# Patient Record
Sex: Male | Born: 1945 | Race: White | Hispanic: No | Smoking: Current every day smoker
Health system: Southern US, Community
[De-identification: ages and names within clinical notes are randomized; demographics above are authoritative.]

## PROBLEM LIST (undated history)

## (undated) DIAGNOSIS — F1010 Alcohol abuse, uncomplicated: Secondary | ICD-10-CM

## (undated) DIAGNOSIS — C61 Malignant neoplasm of prostate: Secondary | ICD-10-CM

## (undated) DIAGNOSIS — R000 Tachycardia, unspecified: Secondary | ICD-10-CM

## (undated) DIAGNOSIS — I10 Essential (primary) hypertension: Secondary | ICD-10-CM

## (undated) DIAGNOSIS — R7989 Other specified abnormal findings of blood chemistry: Secondary | ICD-10-CM

## (undated) DIAGNOSIS — K7682 Hepatic encephalopathy: Secondary | ICD-10-CM

## (undated) DIAGNOSIS — I639 Cerebral infarction, unspecified: Secondary | ICD-10-CM

## (undated) DIAGNOSIS — F32A Depression, unspecified: Secondary | ICD-10-CM

## (undated) DIAGNOSIS — F329 Major depressive disorder, single episode, unspecified: Secondary | ICD-10-CM

## (undated) DIAGNOSIS — G4700 Insomnia, unspecified: Secondary | ICD-10-CM

## (undated) DIAGNOSIS — I2510 Atherosclerotic heart disease of native coronary artery without angina pectoris: Secondary | ICD-10-CM

## (undated) DIAGNOSIS — I214 Non-ST elevation (NSTEMI) myocardial infarction: Secondary | ICD-10-CM

## (undated) DIAGNOSIS — F411 Generalized anxiety disorder: Secondary | ICD-10-CM

## (undated) DIAGNOSIS — K700 Alcoholic fatty liver: Secondary | ICD-10-CM

## (undated) DIAGNOSIS — K7030 Alcoholic cirrhosis of liver without ascites: Secondary | ICD-10-CM

## (undated) DIAGNOSIS — I5189 Other ill-defined heart diseases: Secondary | ICD-10-CM

## (undated) DIAGNOSIS — K7200 Acute and subacute hepatic failure without coma: Secondary | ICD-10-CM

## (undated) HISTORY — DX: Hepatic encephalopathy: K76.82

## (undated) HISTORY — DX: Malignant neoplasm of prostate: C61

## (undated) HISTORY — DX: Other specified abnormal findings of blood chemistry: R79.89

## (undated) HISTORY — DX: Generalized anxiety disorder: F41.1

## (undated) HISTORY — DX: Essential (primary) hypertension: I10

## (undated) HISTORY — DX: Alcoholic cirrhosis of liver without ascites: K70.30

## (undated) HISTORY — DX: Insomnia, unspecified: G47.00

## (undated) HISTORY — DX: Acute and subacute hepatic failure without coma: K72.00

---

## 1998-04-22 ENCOUNTER — Other Ambulatory Visit: Admission: RE | Admit: 1998-04-22 | Discharge: 1998-04-22 | Payer: Self-pay | Admitting: Family Medicine

## 2001-01-26 ENCOUNTER — Encounter: Admission: RE | Admit: 2001-01-26 | Discharge: 2001-01-26 | Payer: Self-pay | Admitting: Family Medicine

## 2001-01-26 ENCOUNTER — Encounter: Payer: Self-pay | Admitting: Family Medicine

## 2001-04-24 ENCOUNTER — Encounter: Admission: RE | Admit: 2001-04-24 | Discharge: 2001-04-24 | Payer: Self-pay | Admitting: Family Medicine

## 2001-04-24 ENCOUNTER — Encounter: Payer: Self-pay | Admitting: Family Medicine

## 2003-08-28 ENCOUNTER — Encounter: Admission: RE | Admit: 2003-08-28 | Discharge: 2003-08-28 | Payer: Self-pay | Admitting: Family Medicine

## 2003-08-28 ENCOUNTER — Encounter: Payer: Self-pay | Admitting: Family Medicine

## 2008-12-19 DIAGNOSIS — C61 Malignant neoplasm of prostate: Secondary | ICD-10-CM

## 2008-12-19 HISTORY — DX: Malignant neoplasm of prostate: C61

## 2009-04-28 ENCOUNTER — Ambulatory Visit: Admission: RE | Admit: 2009-04-28 | Discharge: 2009-07-27 | Payer: Self-pay | Admitting: Radiation Oncology

## 2009-07-28 ENCOUNTER — Ambulatory Visit: Admission: RE | Admit: 2009-07-28 | Discharge: 2009-09-25 | Payer: Self-pay | Admitting: Radiation Oncology

## 2011-11-15 ENCOUNTER — Other Ambulatory Visit: Payer: Self-pay | Admitting: Gastroenterology

## 2011-11-16 ENCOUNTER — Ambulatory Visit
Admission: RE | Admit: 2011-11-16 | Discharge: 2011-11-16 | Disposition: A | Discharge status: Home / Self Care | Payer: BC Managed Care – PPO | Source: Ambulatory Visit | Attending: Gastroenterology | Admitting: Gastroenterology

## 2011-11-16 MED ORDER — IOHEXOL 300 MG/ML  SOLN
125.0000 mL | Freq: Once | INTRAMUSCULAR | Status: AC | PRN
  Administered 2011-11-16: 125 mL via INTRAVENOUS

## 2012-11-19 DIAGNOSIS — R748 Abnormal levels of other serum enzymes: Secondary | ICD-10-CM | POA: Diagnosis not present

## 2013-01-25 DIAGNOSIS — N529 Male erectile dysfunction, unspecified: Secondary | ICD-10-CM | POA: Diagnosis not present

## 2013-01-25 DIAGNOSIS — C61 Malignant neoplasm of prostate: Secondary | ICD-10-CM | POA: Diagnosis not present

## 2013-04-08 DIAGNOSIS — E559 Vitamin D deficiency, unspecified: Secondary | ICD-10-CM | POA: Diagnosis not present

## 2013-05-16 DIAGNOSIS — E782 Mixed hyperlipidemia: Secondary | ICD-10-CM | POA: Diagnosis not present

## 2013-07-09 DIAGNOSIS — E559 Vitamin D deficiency, unspecified: Secondary | ICD-10-CM | POA: Diagnosis not present

## 2013-07-09 DIAGNOSIS — R7989 Other specified abnormal findings of blood chemistry: Secondary | ICD-10-CM | POA: Diagnosis not present

## 2013-09-05 DIAGNOSIS — C61 Malignant neoplasm of prostate: Secondary | ICD-10-CM | POA: Diagnosis not present

## 2013-09-19 DIAGNOSIS — C61 Malignant neoplasm of prostate: Secondary | ICD-10-CM | POA: Diagnosis not present

## 2013-09-19 DIAGNOSIS — N529 Male erectile dysfunction, unspecified: Secondary | ICD-10-CM | POA: Diagnosis not present

## 2013-11-05 ENCOUNTER — Ambulatory Visit (INDEPENDENT_AMBULATORY_CARE_PROVIDER_SITE_OTHER): Payer: Medicare Other | Admitting: Internal Medicine

## 2013-11-05 ENCOUNTER — Encounter: Payer: Self-pay | Admitting: Internal Medicine

## 2013-11-05 VITALS — BP 160/100 | HR 74 | Temp 97.3°F | Ht 70.0 in | Wt 217.0 lb

## 2013-11-05 DIAGNOSIS — H6980 Other specified disorders of Eustachian tube, unspecified ear: Secondary | ICD-10-CM

## 2013-11-05 DIAGNOSIS — H6983 Other specified disorders of Eustachian tube, bilateral: Secondary | ICD-10-CM

## 2013-11-05 DIAGNOSIS — H6990 Unspecified Eustachian tube disorder, unspecified ear: Secondary | ICD-10-CM

## 2013-11-05 MED ORDER — AMLODIPINE BESYLATE 10 MG PO TABS: 10.0000 mg | ORAL_TABLET | Freq: Every day | ORAL | Status: DC

## 2013-11-05 NOTE — Progress Notes (Signed)
  Subjective:    Patient ID: Joshua Henson, male    DOB: May 25, 1946, 67 y.o.   MRN: [id number]  HPI Presents acutely for being stopped up through the kind referral by Mr. Joshua Henson. He reports that he feels pressurized.,like with flying. In addition he has had some mild hearing loss - difficulty in crowds- which is chronic but exacerbated. He has mild rhinorrhea, h/o intermittent tinnitus that is now worse. No cough or post-nasal drainage. Tried sudafed 30 mg x 1 and it seemed to help.  Past Medical History  Diagnosis Date  . Prostate cancer 2010    External beam radiation (urol - [name], XRT [name])   Past Surgical History  Procedure Laterality Date  . None     Family History  Problem Relation Age of Onset  . Cancer Mother     esophageal  . Hypertension Mother   . Diabetes Mother   . Cancer Father     prostate  . Heart disease Maternal Grandfather     MI  . Cancer Paternal Grandfather     prostate   History   Social History  . Marital Status: Married    Spouse Name: N/A    Number of Children: 3  . Years of Education: 16   Occupational History  . lawyer    Social History Main Topics  . Smoking status: Current Every Day Smoker -- 1.00 packs/day for 30 years    Types: Cigarettes  . Smokeless tobacco: Never Used  . Alcohol Use: 3.5 oz/week    7 drink(s) per week     Comment: 14 oz per day  . Drug Use: No  . Sexual Activity: No   Other Topics Concern  . Not on file   Social History Narrative   HS[employer], [employer], [employer]. Married 22 yrs yrs - divorced. Twin boys - 33; 1 dtr - '94. Scientist, water quality.     No current outpatient prescriptions on file prior to visit.   No current facility-administered medications on file prior to visit.      Review of Systems System review is negative for any constitutional, cardiac, pulmonary, GI or neuro symptoms or complaints other than as described in the HPI.     Objective:   Physical Exam Filed Vitals:   11/05/13 1729  BP: 160/100  Pulse: 74  Temp: 97.3 F (36.3 C)   Gen'l;- Well nourished man in no acute distress HEENT- no tenderness of the facial sinuses to percussion. C&S clear. EACs clear. TM's pearly but with poor movement to insufflation. Nodes - negative Cor 2+ radial pulse, RRR Pulm - normal respirations Neuro - Alert and oriented, cognition is normal, normal gait and station.        Assessment & Plan:  1. Hearing loss - all evidence points to eustachian tube dysfunction. Plan  Sudafed (generic) 30 mg two or three times a day  2. Hearing loss - high frequency, speech range, loss. Best to get a precise assessment. Plan refer to Joshua Henson, AUD [phone], [address]   3. Blood pressure -  Watch it.  4. Smoking - recommend cessation.

## 2013-11-05 NOTE — Patient Instructions (Signed)
1. Hearing loss - all evidence points to eustachian tube dysfunction. Plan  Sudafed (generic) 30 mg two or three times a day  2. Hearing loss - high frequency, speech range, loss. Best to get a precise assessment. Plan refer to [name], AUD [phone], [address]   3. Blood pressure -  Watch it.  4. Smoking - recommend cessation.

## 2013-11-05 NOTE — Progress Notes (Signed)
Pre visit review using our clinic review tool, if applicable. No additional management support is needed unless otherwise documented below in the visit note. 

## 2013-11-25 ENCOUNTER — Other Ambulatory Visit: Payer: Self-pay | Admitting: Internal Medicine

## 2013-11-25 ENCOUNTER — Encounter: Payer: Self-pay | Admitting: Internal Medicine

## 2013-11-25 DIAGNOSIS — H9190 Unspecified hearing loss, unspecified ear: Secondary | ICD-10-CM | POA: Insufficient documentation

## 2013-11-25 DIAGNOSIS — H9313 Tinnitus, bilateral: Secondary | ICD-10-CM | POA: Insufficient documentation

## 2013-11-25 DIAGNOSIS — H9193 Unspecified hearing loss, bilateral: Secondary | ICD-10-CM

## 2013-11-28 DIAGNOSIS — H6980 Other specified disorders of Eustachian tube, unspecified ear: Secondary | ICD-10-CM | POA: Diagnosis not present

## 2013-11-28 DIAGNOSIS — J329 Chronic sinusitis, unspecified: Secondary | ICD-10-CM | POA: Diagnosis not present

## 2013-12-03 DIAGNOSIS — H9209 Otalgia, unspecified ear: Secondary | ICD-10-CM | POA: Diagnosis not present

## 2013-12-03 DIAGNOSIS — H8120 Vestibular neuronitis, unspecified ear: Secondary | ICD-10-CM | POA: Diagnosis not present

## 2013-12-03 DIAGNOSIS — H905 Unspecified sensorineural hearing loss: Secondary | ICD-10-CM | POA: Diagnosis not present

## 2013-12-03 DIAGNOSIS — H9319 Tinnitus, unspecified ear: Secondary | ICD-10-CM | POA: Diagnosis not present

## 2013-12-04 ENCOUNTER — Telehealth: Payer: Self-pay | Admitting: Internal Medicine

## 2013-12-04 NOTE — Telephone Encounter (Signed)
Recd records from [facility],Forwarding 4pgs to Dr.Norins

## 2013-12-30 ENCOUNTER — Other Ambulatory Visit: Payer: Self-pay | Admitting: Otolaryngology

## 2013-12-30 DIAGNOSIS — H9319 Tinnitus, unspecified ear: Secondary | ICD-10-CM | POA: Diagnosis not present

## 2013-12-30 DIAGNOSIS — H8120 Vestibular neuronitis, unspecified ear: Secondary | ICD-10-CM

## 2013-12-30 DIAGNOSIS — H905 Unspecified sensorineural hearing loss: Secondary | ICD-10-CM | POA: Diagnosis not present

## 2013-12-30 DIAGNOSIS — H903 Sensorineural hearing loss, bilateral: Secondary | ICD-10-CM

## 2013-12-30 DIAGNOSIS — H9209 Otalgia, unspecified ear: Secondary | ICD-10-CM | POA: Diagnosis not present

## 2013-12-31 ENCOUNTER — Telehealth: Payer: Self-pay | Admitting: Internal Medicine

## 2013-12-31 NOTE — Telephone Encounter (Signed)
Rec'd from [facility] forward 4 pages to Dr.Norins

## 2014-01-01 ENCOUNTER — Other Ambulatory Visit: Payer: Self-pay | Admitting: Otolaryngology

## 2014-01-01 DIAGNOSIS — H903 Sensorineural hearing loss, bilateral: Secondary | ICD-10-CM

## 2014-01-01 DIAGNOSIS — H905 Unspecified sensorineural hearing loss: Secondary | ICD-10-CM

## 2014-01-01 DIAGNOSIS — H9319 Tinnitus, unspecified ear: Secondary | ICD-10-CM

## 2014-01-02 ENCOUNTER — Inpatient Hospital Stay: Admission: RE | Admit: 2014-01-02 | Payer: Medicare Other | Source: Ambulatory Visit

## 2014-01-03 ENCOUNTER — Other Ambulatory Visit: Payer: Self-pay | Admitting: Otolaryngology

## 2014-01-03 DIAGNOSIS — H903 Sensorineural hearing loss, bilateral: Secondary | ICD-10-CM | POA: Diagnosis not present

## 2014-01-03 DIAGNOSIS — H9319 Tinnitus, unspecified ear: Secondary | ICD-10-CM | POA: Diagnosis not present

## 2014-01-03 DIAGNOSIS — H905 Unspecified sensorineural hearing loss: Secondary | ICD-10-CM | POA: Diagnosis not present

## 2014-01-03 LAB — CREATININE, SERUM: CREATININE: 0.84 mg/dL (ref 0.50–1.35)

## 2014-01-03 LAB — BUN: BUN: 14 mg/dL (ref 6–23)

## 2014-01-07 ENCOUNTER — Inpatient Hospital Stay: Admission: RE | Admit: 2014-01-07 | Payer: Medicare Other | Source: Ambulatory Visit

## 2014-01-07 ENCOUNTER — Other Ambulatory Visit: Payer: Medicare Other

## 2014-01-07 ENCOUNTER — Ambulatory Visit
Admission: RE | Admit: 2014-01-07 | Discharge: 2014-01-07 | Disposition: A | Discharge status: Home / Self Care | Payer: Medicare Other | Source: Ambulatory Visit | Attending: Otolaryngology | Admitting: Otolaryngology

## 2014-01-07 DIAGNOSIS — H9319 Tinnitus, unspecified ear: Secondary | ICD-10-CM | POA: Diagnosis not present

## 2014-01-07 DIAGNOSIS — H903 Sensorineural hearing loss, bilateral: Secondary | ICD-10-CM

## 2014-01-07 DIAGNOSIS — H905 Unspecified sensorineural hearing loss: Secondary | ICD-10-CM

## 2014-01-07 DIAGNOSIS — H9190 Unspecified hearing loss, unspecified ear: Secondary | ICD-10-CM | POA: Diagnosis not present

## 2014-01-07 MED ORDER — IOHEXOL 300 MG/ML  SOLN
100.0000 mL | Freq: Once | INTRAMUSCULAR | Status: AC | PRN
  Administered 2014-01-07: 75 mL via INTRAVENOUS

## 2014-01-10 ENCOUNTER — Telehealth: Payer: Self-pay | Admitting: Internal Medicine

## 2014-01-10 NOTE — Telephone Encounter (Signed)
Rec'd from [facility] forward 4 pages to [place]

## 2014-01-17 DIAGNOSIS — Z720 Tobacco use: Secondary | ICD-10-CM | POA: Insufficient documentation

## 2014-02-07 DIAGNOSIS — H9319 Tinnitus, unspecified ear: Secondary | ICD-10-CM | POA: Diagnosis not present

## 2014-02-07 DIAGNOSIS — H918X9 Other specified hearing loss, unspecified ear: Secondary | ICD-10-CM | POA: Diagnosis not present

## 2014-02-07 DIAGNOSIS — H903 Sensorineural hearing loss, bilateral: Secondary | ICD-10-CM | POA: Diagnosis not present

## 2014-02-07 DIAGNOSIS — H9190 Unspecified hearing loss, unspecified ear: Secondary | ICD-10-CM | POA: Diagnosis not present

## 2014-07-01 ENCOUNTER — Other Ambulatory Visit: Payer: Self-pay | Admitting: Physician Assistant

## 2014-07-01 ENCOUNTER — Inpatient Hospital Stay (HOSPITAL_COMMUNITY)
Admission: EM | Admit: 2014-07-01 | Discharge: 2014-07-07 | DRG: 640 | Disposition: A | Discharge status: Rehabilitation Facility | Payer: Medicare Other | Attending: Internal Medicine | Admitting: Internal Medicine

## 2014-07-01 ENCOUNTER — Encounter (HOSPITAL_COMMUNITY): Payer: Self-pay | Admitting: Emergency Medicine

## 2014-07-01 ENCOUNTER — Ambulatory Visit
Admission: RE | Admit: 2014-07-01 | Discharge: 2014-07-01 | Disposition: A | Discharge status: Home / Self Care | Payer: Medicare Other | Source: Ambulatory Visit | Attending: Physician Assistant | Admitting: Physician Assistant

## 2014-07-01 DIAGNOSIS — W19XXXS Unspecified fall, sequela: Secondary | ICD-10-CM

## 2014-07-01 DIAGNOSIS — J320 Chronic maxillary sinusitis: Secondary | ICD-10-CM | POA: Diagnosis present

## 2014-07-01 DIAGNOSIS — R55 Syncope and collapse: Secondary | ICD-10-CM | POA: Diagnosis not present

## 2014-07-01 DIAGNOSIS — R5381 Other malaise: Secondary | ICD-10-CM | POA: Diagnosis not present

## 2014-07-01 DIAGNOSIS — F411 Generalized anxiety disorder: Secondary | ICD-10-CM | POA: Diagnosis present

## 2014-07-01 DIAGNOSIS — H8110 Benign paroxysmal vertigo, unspecified ear: Secondary | ICD-10-CM | POA: Diagnosis present

## 2014-07-01 DIAGNOSIS — R404 Transient alteration of awareness: Secondary | ICD-10-CM | POA: Diagnosis not present

## 2014-07-01 DIAGNOSIS — S0101XD Laceration without foreign body of scalp, subsequent encounter: Secondary | ICD-10-CM

## 2014-07-01 DIAGNOSIS — S0100XA Unspecified open wound of scalp, initial encounter: Secondary | ICD-10-CM | POA: Diagnosis present

## 2014-07-01 DIAGNOSIS — E785 Hyperlipidemia, unspecified: Secondary | ICD-10-CM | POA: Diagnosis present

## 2014-07-01 DIAGNOSIS — Z923 Personal history of irradiation: Secondary | ICD-10-CM

## 2014-07-01 DIAGNOSIS — E860 Dehydration: Secondary | ICD-10-CM | POA: Diagnosis not present

## 2014-07-01 DIAGNOSIS — S0180XA Unspecified open wound of other part of head, initial encounter: Secondary | ICD-10-CM | POA: Diagnosis not present

## 2014-07-01 DIAGNOSIS — F1010 Alcohol abuse, uncomplicated: Secondary | ICD-10-CM | POA: Diagnosis present

## 2014-07-01 DIAGNOSIS — F17200 Nicotine dependence, unspecified, uncomplicated: Secondary | ICD-10-CM | POA: Diagnosis present

## 2014-07-01 DIAGNOSIS — I951 Orthostatic hypotension: Secondary | ICD-10-CM | POA: Diagnosis present

## 2014-07-01 DIAGNOSIS — Y92009 Unspecified place in unspecified non-institutional (private) residence as the place of occurrence of the external cause: Secondary | ICD-10-CM

## 2014-07-01 DIAGNOSIS — I6350 Cerebral infarction due to unspecified occlusion or stenosis of unspecified cerebral artery: Secondary | ICD-10-CM | POA: Diagnosis present

## 2014-07-01 DIAGNOSIS — F3289 Other specified depressive episodes: Secondary | ICD-10-CM | POA: Diagnosis present

## 2014-07-01 DIAGNOSIS — S2020XA Contusion of thorax, unspecified, initial encounter: Secondary | ICD-10-CM | POA: Diagnosis not present

## 2014-07-01 DIAGNOSIS — H9193 Unspecified hearing loss, bilateral: Secondary | ICD-10-CM

## 2014-07-01 DIAGNOSIS — S0181XA Laceration without foreign body of other part of head, initial encounter: Secondary | ICD-10-CM

## 2014-07-01 DIAGNOSIS — W1809XA Striking against other object with subsequent fall, initial encounter: Secondary | ICD-10-CM | POA: Diagnosis present

## 2014-07-01 DIAGNOSIS — R748 Abnormal levels of other serum enzymes: Secondary | ICD-10-CM | POA: Diagnosis not present

## 2014-07-01 DIAGNOSIS — Z8546 Personal history of malignant neoplasm of prostate: Secondary | ICD-10-CM

## 2014-07-01 DIAGNOSIS — Z79899 Other long term (current) drug therapy: Secondary | ICD-10-CM

## 2014-07-01 DIAGNOSIS — J9819 Other pulmonary collapse: Secondary | ICD-10-CM | POA: Diagnosis not present

## 2014-07-01 DIAGNOSIS — S0101XA Laceration without foreign body of scalp, initial encounter: Secondary | ICD-10-CM

## 2014-07-01 DIAGNOSIS — F329 Major depressive disorder, single episode, unspecified: Secondary | ICD-10-CM | POA: Diagnosis present

## 2014-07-01 DIAGNOSIS — E876 Hypokalemia: Secondary | ICD-10-CM | POA: Diagnosis present

## 2014-07-01 LAB — CBC
HCT: 36.3 % — ABNORMAL LOW (ref 39.0–52.0)
Hemoglobin: 13.4 g/dL (ref 13.0–17.0)
MCH: 37.9 pg — ABNORMAL HIGH (ref 26.0–34.0)
MCHC: 36.9 g/dL — ABNORMAL HIGH (ref 30.0–36.0)
MCV: 102.5 fL — AB (ref 78.0–100.0)
PLATELETS: 164 10*3/uL (ref 150–400)
RBC: 3.54 MIL/uL — AB (ref 4.22–5.81)
RDW: 14 % (ref 11.5–15.5)
WBC: 4.3 10*3/uL (ref 4.0–10.5)

## 2014-07-01 LAB — I-STAT CHEM 8, ED
BUN: 10 mg/dL (ref 6–23)
Calcium, Ion: 1.06 mmol/L — ABNORMAL LOW (ref 1.13–1.30)
Chloride: 98 mEq/L (ref 96–112)
Creatinine, Ser: 1.1 mg/dL (ref 0.50–1.35)
Glucose, Bld: 125 mg/dL — ABNORMAL HIGH (ref 70–99)
HCT: 41 % (ref 39.0–52.0)
Hemoglobin: 13.9 g/dL (ref 13.0–17.0)
Potassium: 3.4 mEq/L — ABNORMAL LOW (ref 3.7–5.3)
SODIUM: 139 meq/L (ref 137–147)
TCO2: 28 mmol/L (ref 0–100)

## 2014-07-01 LAB — I-STAT TROPONIN, ED: Troponin i, poc: 0.01 ng/mL (ref 0.00–0.08)

## 2014-07-01 NOTE — ED Provider Notes (Signed)
CSN: [id number]     Arrival date & time 07/01/14  2259 History   First MD Initiated Contact with Patient 07/01/14 2304     Chief Complaint  Patient presents with  . Loss of Consciousness     (Consider location/radiation/quality/duration/timing/severity/associated sxs/prior Treatment) HPI Comments: The patient is a 68 year old male with a history of hypertension and hypercholesterolemia. He has recently been diagnosed with some type of injury or disorder that causes him to feel off balance occasionally. He states that on Friday evening, or 4 days ago he had a syncopal event after feeling a heaviness in his head, fell to the ground striking his chest on a chair and then felt completely back to normal after 90 seconds. There was no reported seizure activity, he did not seek emergency evaluation at that time. He was seen at his family doctor's office today, lab work was done and an x-ray of his chest, he is unaware of the results. He states that this evening he had another syncopal episode. He does not remember any details of this event. His family member who accompanies him to the hospital states that she heard a large thud coming from the kitchen, found him on the ground with a laceration to above the left eyebrow he which was bleeding., There was no seizure activity, no posturing, no vomiting. When paramedics arrived they dressed the wound with a pressure dressing, found him to be hypotensive upon standing but normotensive when lying down. The patient denies any prodromal symptoms, he has no memory of the event. He does endorse being a heavy daily drinker and states he had 3 drinks tonight but he states this is normal for him. He has not had any exertional symptoms, denies swelling, denies diarrhea but has not had an appetite, has had a mild weight loss over the last 3 months  Patient is a 68 y.o. male presenting with syncope. The history is provided by the patient, a relative and the EMS personnel.   Loss of Consciousness   Past Medical History  Diagnosis Date  . Prostate cancer 2010    External beam radiation (urol - Risa Grill, XRT Valere Dross)   Past Surgical History  Procedure Laterality Date  . None     Family History  Problem Relation Age of Onset  . Cancer Mother     esophageal  . Hypertension Mother   . Diabetes Mother   . Cancer Father     prostate  . Heart disease Maternal Grandfather     MI  . Cancer Paternal Grandfather     prostate   History  Substance Use Topics  . Smoking status: Current Every Day Smoker -- 1.00 packs/day for 30 years    Types: Cigarettes  . Smokeless tobacco: Never Used  . Alcohol Use: 3.5 oz/week    7 drink(s) per week     Comment: 14 oz per day    Review of Systems  Cardiovascular: Positive for syncope.  All other systems reviewed and are negative.     Allergies  Celebrex  Home Medications   Prior to Admission medications   Medication Sig Start Date End Date Taking? Authorizing Provider  amLODipine (NORVASC) 10 MG tablet Take 1 tablet (10 mg total) by mouth daily. 11/05/13  Yes Neena Rhymes, MD  Atorvastatin Calcium (LIPITOR PO) Take by mouth daily.   Yes Historical Provider, MD   BP 137/87  Pulse 72  Temp(Src) 98.9 F (37.2 C) (Oral)  Resp 18  Ht 5' 10.5" (  1.791 m)  Wt 205 lb 1.6 oz (93.033 kg)  BMI 29.00 kg/m2  SpO2 95% Physical Exam  Nursing note and vitals reviewed. Constitutional: He appears well-developed and well-nourished. No distress.  HENT:  Head: Normocephalic.  Mouth/Throat: Oropharynx is clear and moist. No oropharyngeal exudate.  Linear laceration to the left forehead above the eyebrow with associated hematoma  Eyes: Conjunctivae and EOM are normal. Pupils are equal, round, and reactive to light. Right eye exhibits no discharge. Left eye exhibits no discharge. No scleral icterus.  Neck: Normal range of motion. Neck supple. No JVD present. No thyromegaly present.  Cardiovascular: Normal rate,  regular rhythm, normal heart sounds and intact distal pulses.  Exam reveals no gallop and no friction rub.   No murmur heard. No murmur, no JVD, no carotid bruit  Pulmonary/Chest: Effort normal and breath sounds normal. No respiratory distress. He has no wheezes. He has no rales.  Abdominal: Soft. Bowel sounds are normal. He exhibits no distension and no mass. There is no tenderness.  Musculoskeletal: Normal range of motion. He exhibits no edema and no tenderness.  Lymphadenopathy:    He has no cervical adenopathy.  Neurological: He is alert. Coordination normal.  Speech is clear, cranial nerves III through XII are intact, memory is intact, strength is normal in all 4 extremities including grips, sensation is intact to light touch and pinprick in all 4 extremities.  No limb ataxia, no truncal ataxia  Skin: Skin is warm and dry. No rash noted. No erythema.  Psychiatric: He has a normal mood and affect. His behavior is normal.    ED Course  Procedures (including critical care time) Labs Review Labs Reviewed  CBC - Abnormal; Notable for the following:    RBC 3.54 (*)    HCT 36.3 (*)    MCV 102.5 (*)    MCH 37.9 (*)    MCHC 36.9 (*)    All other components within normal limits  URINALYSIS, ROUTINE W REFLEX MICROSCOPIC - Abnormal; Notable for the following:    Hgb urine dipstick TRACE (*)    All other components within normal limits  COMPREHENSIVE METABOLIC PANEL - Abnormal; Notable for the following:    Potassium 3.6 (*)    Glucose, Bld 113 (*)    Albumin 3.3 (*)    AST 101 (*)    ALT 55 (*)    Anion gap 21 (*)    All other components within normal limits  ETHANOL - Abnormal; Notable for the following:    Alcohol, Ethyl (B) 155 (*)    All other components within normal limits  CBG MONITORING, ED - Abnormal; Notable for the following:    Glucose-Capillary 119 (*)    All other components within normal limits  I-STAT CHEM 8, ED - Abnormal; Notable for the following:    Potassium  3.4 (*)    Glucose, Bld 125 (*)    Calcium, Ion 1.06 (*)    All other components within normal limits  URINE MICROSCOPIC-ADD ON  TROPONIN I  TROPONIN I  TROPONIN I  BASIC METABOLIC PANEL  CBC  I-STAT TROPOININ, ED    Imaging Review Dg Chest 2 View  07/01/2014   CLINICAL DATA:  Syncopal F cell falling for do not image chest  EXAM: CHEST  2 VIEW  COMPARISON:  None.  FINDINGS: The right hemidiaphragm is higher than the left. There is atelectasis anteriorly and posteriorly at the left lung base. There is no pneumothorax or pleural effusion. The right lung  is clear. The cardiac silhouette is top-normal in size. The pulmonary vascularity is normal. There is mild tortuosity of the descending thoracic aorta. The observed portions of the ribs exhibit no acute abnormalities. The thoracic vertebral bodies are preserved in height.  IMPRESSION: There is atelectasis at the left lung base. There is no pneumothorax nor acute rib fracture demonstrated.   Electronically Signed   By: David  Martinique   On: 07/01/2014 15:21   Ct Head Wo Contrast  07/02/2014   CLINICAL DATA:  Syncope and head injury  EXAM: CT HEAD WITHOUT CONTRAST  TECHNIQUE: Contiguous axial images were obtained from the base of the skull through the vertex without intravenous contrast.  COMPARISON:  01/07/2014 temporal bone CT  FINDINGS: Skull and Sinuses:There is a large left frontotemporal scalp laceration and contusion. No underlying fracture.  Probable mucous retention cyst in the posterior left maxillary sinus. An infraorbital air cell on the left may limit left maxillary sinus outflow. Chronic mucosal thickening and neoosteogenesis of the right maxillary sinus. These changes of chronic sinusitis are stable from previous.  Orbits: No acute abnormality.  Brain: No evidence of acute abnormality, such as acute infarction, hemorrhage, hydrocephalus, or mass lesion/mass effect. Generalized cerebral and cerebellar volume loss which is borderline age  advanced. There is mild chronic small vessel disease with patchy deep and periventricular cerebral white matter low density.  IMPRESSION: 1. No acute intracranial injury. 2. Left frontotemporal scalp contusion.  No calvarial fracture. 3. Chronic findings are noted above.   Electronically Signed   By: Jorje Guild M.D.   On: 07/02/2014 01:39     EKG Interpretation   Date/Time:  Tuesday July 01 2014 23:10:21 EDT Ventricular Rate:  82 PR Interval:  148 QRS Duration: 90 QT Interval:  380 QTC Calculation: 444 R Axis:   86 Text Interpretation:  Sinus rhythm Borderline right axis deviation  Borderline T abnormalities, diffuse leads Abnormal ekg No old tracing to  compare Confirmed by Calia Napp  MD, Raheim Beutler ([id number]) on 07/01/2014 11:22:02 PM      MDM   Final diagnoses:  Syncope, unspecified syncope type  Laceration of face, initial encounter    The patient has suffered a head injury, he has a laceration that will need repair, he needs a workup for syncope as he has had 2 episodes of unexplained syncope within 4 days. EKG unremarkable except for nonspecific T-wave flattening. He will be placed on a cardiac monitor, CT scan of the head, labs, and cardiac monitoring.  Cardiac monitoring without any arrhythmias in the emergency department, labs overall unremarkable and CT scan of the head without intracranial injury. The patient would benefit from overnight admission to the hospital for evaluation on a cardiac monitor. We'll discuss with the hospitalist.  LACERATION REPAIR Performed by: Johnna Acosta Authorized by: Johnna Acosta Consent: Verbal consent obtained. Risks and benefits: risks, benefits and alternatives were discussed Consent given by: patient Patient identity confirmed: provided demographic data Prepped and Draped in normal sterile fashion Wound explored  Laceration Location: Face  Laceration Length: 5 cm  No Foreign Bodies seen or palpated  Anesthesia: local  infiltration  Local anesthetic: lidocaine 1 % with epinephrine  Anesthetic total: 4 ml  Irrigation method: syringe Amount of cleaning: standard  Skin closure: 5-0 Prolene   Number of sutures: 7   Technique: Simple interrupted   Patient tolerance: Patient tolerated the procedure well with no immediate complications.   Johnna Acosta, MD 07/02/14 [phone]

## 2014-07-01 NOTE — ED Notes (Signed)
Dr Miller at bedside. 

## 2014-07-01 NOTE — ED Notes (Signed)
Per EMS: Pt from home when he had a syncopal episode, hitting head on countertop. On EMS arrival, pt AO x4. Bleeding controlled to head with pressure bandage. Pt noted to have BP 140/60 laying, but dropped to 60/40 while standing. Pt reports 3 drinks this evening. Pt report similar episode on Friday, where he hit the left side of chest. Pt saw PCP for that fall where he had chest xray.  Pt reports being dx with vertigo.

## 2014-07-02 ENCOUNTER — Emergency Department (HOSPITAL_COMMUNITY): Payer: Medicare Other

## 2014-07-02 DIAGNOSIS — I951 Orthostatic hypotension: Secondary | ICD-10-CM | POA: Diagnosis present

## 2014-07-02 DIAGNOSIS — R55 Syncope and collapse: Secondary | ICD-10-CM

## 2014-07-02 DIAGNOSIS — S0100XA Unspecified open wound of scalp, initial encounter: Secondary | ICD-10-CM | POA: Diagnosis not present

## 2014-07-02 DIAGNOSIS — F1010 Alcohol abuse, uncomplicated: Secondary | ICD-10-CM | POA: Diagnosis present

## 2014-07-02 DIAGNOSIS — S0101XA Laceration without foreign body of scalp, initial encounter: Secondary | ICD-10-CM

## 2014-07-02 LAB — CBC
HCT: 35.9 % — ABNORMAL LOW (ref 39.0–52.0)
HCT: 36.3 % — ABNORMAL LOW (ref 39.0–52.0)
Hemoglobin: 13 g/dL (ref 13.0–17.0)
Hemoglobin: 12.7 g/dL — ABNORMAL LOW (ref 13.0–17.0)
MCH: 35.7 pg — AB (ref 26.0–34.0)
MCH: 35.4 pg — ABNORMAL HIGH (ref 26.0–34.0)
MCHC: 36.2 g/dL — AB (ref 30.0–36.0)
MCHC: 35 g/dL (ref 30.0–36.0)
MCV: 98.6 fL (ref 78.0–100.0)
MCV: 101.1 fL — ABNORMAL HIGH (ref 78.0–100.0)
PLATELETS: 163 10*3/uL (ref 150–400)
PLATELETS: 161 10*3/uL (ref 150–400)
RBC: 3.64 MIL/uL — ABNORMAL LOW (ref 4.22–5.81)
RBC: 3.59 MIL/uL — AB (ref 4.22–5.81)
RDW: 14.1 % (ref 11.5–15.5)
RDW: 14.2 % (ref 11.5–15.5)
WBC: 5.8 10*3/uL (ref 4.0–10.5)
WBC: 5.6 10*3/uL (ref 4.0–10.5)

## 2014-07-02 LAB — URINALYSIS, ROUTINE W REFLEX MICROSCOPIC
BILIRUBIN URINE: NEGATIVE
Glucose, UA: NEGATIVE mg/dL
Ketones, ur: NEGATIVE mg/dL
LEUKOCYTES UA: NEGATIVE
NITRITE: NEGATIVE
PH: 7 (ref 5.0–8.0)
Protein, ur: NEGATIVE mg/dL
Specific Gravity, Urine: 1.016 (ref 1.005–1.030)
Urobilinogen, UA: 1 mg/dL (ref 0.0–1.0)

## 2014-07-02 LAB — BASIC METABOLIC PANEL
ANION GAP: 16 — AB (ref 5–15)
BUN: 9 mg/dL (ref 6–23)
CALCIUM: 8.9 mg/dL (ref 8.4–10.5)
CO2: 25 mEq/L (ref 19–32)
CREATININE: 0.57 mg/dL (ref 0.50–1.35)
Chloride: 103 mEq/L (ref 96–112)
Glucose, Bld: 111 mg/dL — ABNORMAL HIGH (ref 70–99)
Potassium: 3.3 mEq/L — ABNORMAL LOW (ref 3.7–5.3)
Sodium: 144 mEq/L (ref 137–147)

## 2014-07-02 LAB — COMPREHENSIVE METABOLIC PANEL
ALT: 55 U/L — AB (ref 0–53)
AST: 101 U/L — ABNORMAL HIGH (ref 0–37)
Albumin: 3.3 g/dL — ABNORMAL LOW (ref 3.5–5.2)
Alkaline Phosphatase: 61 U/L (ref 39–117)
Anion gap: 21 — ABNORMAL HIGH (ref 5–15)
BUN: 10 mg/dL (ref 6–23)
CALCIUM: 8.8 mg/dL (ref 8.4–10.5)
CO2: 21 mEq/L (ref 19–32)
Chloride: 98 mEq/L (ref 96–112)
Creatinine, Ser: 0.58 mg/dL (ref 0.50–1.35)
GFR calc Af Amer: >90 mL/min (ref >90)
GLUCOSE: 113 mg/dL — AB (ref 70–99)
POTASSIUM: 3.6 meq/L — AB (ref 3.7–5.3)
SODIUM: 140 meq/L (ref 137–147)
Total Bilirubin: 0.6 mg/dL (ref 0.3–1.2)
Total Protein: 6.4 g/dL (ref 6.0–8.3)

## 2014-07-02 LAB — ETHANOL: Alcohol, Ethyl (B): 155 mg/dL — ABNORMAL HIGH (ref 0–11)

## 2014-07-02 LAB — TROPONIN I
Troponin I: <0.3 ng/mL (ref <0.30)
Troponin I: <0.3 ng/mL (ref <0.30)
Troponin I: <0.3 ng/mL (ref <0.30)

## 2014-07-02 LAB — URINE MICROSCOPIC-ADD ON

## 2014-07-02 LAB — CBG MONITORING, ED: GLUCOSE-CAPILLARY: 119 mg/dL — AB (ref 70–99)

## 2014-07-02 MED ORDER — SODIUM CHLORIDE 0.9 % IV SOLN
INTRAVENOUS | Status: DC
  Administered 2014-07-02: 100 mL/h via INTRAVENOUS
  Administered 2014-07-03 – 2014-07-04 (×3): via INTRAVENOUS
  Administered 2014-07-05 (×2): 1000 mL via INTRAVENOUS

## 2014-07-02 MED ORDER — ONDANSETRON HCL 4 MG/2ML IJ SOLN: 4.0000 mg | Freq: Four times a day (QID) | INTRAMUSCULAR | Status: DC | PRN

## 2014-07-02 MED ORDER — FOLIC ACID 1 MG PO TABS
1.0000 mg | ORAL_TABLET | Freq: Every day | ORAL | Status: DC
  Administered 2014-07-02 – 2014-07-07 (×6): 1 mg via ORAL
  Filled 2014-07-02 (×6): qty 1

## 2014-07-02 MED ORDER — ONDANSETRON HCL 4 MG PO TABS: 4.0000 mg | ORAL_TABLET | Freq: Four times a day (QID) | ORAL | Status: DC | PRN

## 2014-07-02 MED ORDER — THIAMINE HCL 100 MG/ML IJ SOLN
100.0000 mg | Freq: Every day | INTRAMUSCULAR | Status: DC
  Filled 2014-07-02 (×2): qty 1

## 2014-07-02 MED ORDER — LORAZEPAM 2 MG/ML IJ SOLN
0.0000 mg | Freq: Four times a day (QID) | INTRAMUSCULAR | Status: AC
  Administered 2014-07-02: 1 mg via INTRAVENOUS
  Administered 2014-07-02 – 2014-07-04 (×3): 2 mg via INTRAVENOUS
  Filled 2014-07-02: qty 2
  Filled 2014-07-02 (×4): qty 1

## 2014-07-02 MED ORDER — HYDROMORPHONE HCL PF 1 MG/ML IJ SOLN: 0.5000 mg | INTRAMUSCULAR | Status: DC | PRN

## 2014-07-02 MED ORDER — SODIUM CHLORIDE 0.9 % IV BOLUS (SEPSIS)
1000.0000 mL | Freq: Once | INTRAVENOUS | Status: AC
  Administered 2014-07-02: 1000 mL via INTRAVENOUS

## 2014-07-02 MED ORDER — VITAMIN B-1 100 MG PO TABS
100.0000 mg | ORAL_TABLET | Freq: Every day | ORAL | Status: DC
  Administered 2014-07-02 – 2014-07-07 (×6): 100 mg via ORAL
  Filled 2014-07-02 (×6): qty 1

## 2014-07-02 MED ORDER — ACETAMINOPHEN 650 MG RE SUPP: 650.0000 mg | Freq: Four times a day (QID) | RECTAL | Status: DC | PRN

## 2014-07-02 MED ORDER — ADULT MULTIVITAMIN W/MINERALS CH
1.0000 | ORAL_TABLET | Freq: Every day | ORAL | Status: DC
  Administered 2014-07-02 – 2014-07-07 (×6): 1 via ORAL
  Filled 2014-07-02 (×6): qty 1

## 2014-07-02 MED ORDER — LORAZEPAM 1 MG PO TABS
1.0000 mg | ORAL_TABLET | Freq: Four times a day (QID) | ORAL | Status: AC | PRN
  Administered 2014-07-03 – 2014-07-04 (×3): 1 mg via ORAL
  Filled 2014-07-02 (×4): qty 1

## 2014-07-02 MED ORDER — LORAZEPAM 2 MG/ML IJ SOLN
0.0000 mg | Freq: Two times a day (BID) | INTRAMUSCULAR | Status: AC
  Administered 2014-07-03: 4 mg via INTRAVENOUS
  Administered 2014-07-04: 1 mg via INTRAVENOUS
  Administered 2014-07-05 (×2): 2 mg via INTRAVENOUS
  Filled 2014-07-02 (×4): qty 1

## 2014-07-02 MED ORDER — LORAZEPAM 2 MG/ML IJ SOLN
1.0000 mg | Freq: Four times a day (QID) | INTRAMUSCULAR | Status: AC | PRN
  Administered 2014-07-02 – 2014-07-04 (×2): 1 mg via INTRAVENOUS
  Filled 2014-07-02: qty 1

## 2014-07-02 MED ORDER — ENOXAPARIN SODIUM 40 MG/0.4ML ~~LOC~~ SOLN
40.0000 mg | SUBCUTANEOUS | Status: DC
  Administered 2014-07-02 – 2014-07-07 (×6): 40 mg via SUBCUTANEOUS
  Filled 2014-07-02 (×6): qty 0.4

## 2014-07-02 MED ORDER — OXYCODONE HCL 5 MG PO TABS: 5.0000 mg | ORAL_TABLET | ORAL | Status: DC | PRN

## 2014-07-02 MED ORDER — ACETAMINOPHEN 325 MG PO TABS: 650.0000 mg | ORAL_TABLET | Freq: Four times a day (QID) | ORAL | Status: DC | PRN

## 2014-07-02 MED ORDER — ALUM & MAG HYDROXIDE-SIMETH 200-200-20 MG/5ML PO SUSP: 30.0000 mL | Freq: Four times a day (QID) | ORAL | Status: DC | PRN

## 2014-07-02 MED ORDER — ONDANSETRON HCL 4 MG/2ML IJ SOLN: 4.0000 mg | Freq: Three times a day (TID) | INTRAMUSCULAR | Status: AC | PRN

## 2014-07-02 NOTE — ED Notes (Signed)
The patient is unable to give an urine specimen at this time. The tech has advised the patient to use the call light for assistance to use the restroom. The tech has reported to the RN in charge.

## 2014-07-02 NOTE — Progress Notes (Signed)
Utilization review completed.  

## 2014-07-02 NOTE — H&P (Addendum)
[facility] Admission History and Physical       Joshua Henson [facility] HKV:[id number] DOB: [birth year] DOA: 07/01/2014  Referring physician:  EDP PCP: Joshua Hare, MD  Specialists:   Chief Complaint: Passed Out  HPI: Joshua Henson is a 68 y.o. male with remote history of Prostate Cancer S/P Radiation Rx who presents to the ED after he got up to night to go into his kitchen and he passed out and hit his head on the kitchen counter and suffered a laceration to his left eyebrow area.  When he arrived at the ED, he was evaluated and found to have orthostatic hypotension with systolic blood pressures of that dropped from 140/60 to 60/40.   He reports not eating or drinking well for the past 3 months, and drinking heavily.   He reports having increased depression and drinking  because of his depression.   He reports losing 5 pounds unintentionally over the past 3 months.  He reports that he drinks a pint daily usually, but this evening he only drank 3 shots.   He had a similar episode 4 days ago when he passed out after feeling a heaviness in his head, he fell to the ground striking his chest on a chair.   He was seen by his PCP today and was sent for an outpatient chest X-ray of which the results were reviewed and discussed with him by the EDP tonight which was negative for any rib fractures.  When he had the syncopal event 4 days ago, he stated that it took him  90 seconds to feel completely back to normal.     `  Review of Systems:  Constitutional: No Weight Loss, No Weight Gain, Night Sweats, Fevers, Chills, Dizziness, Fatigue, or +Generalized Weakness HEENT: No Headaches, Difficulty Swallowing,Tooth/Dental Problems,Sore Throat,  No Sneezing, Rhinitis, Ear Ache, Nasal Congestion, or Post Nasal Drip,  Cardio-vascular:  No Chest pain, Orthopnea, PND, Edema in Lower Extremities, Anasarca, Dizziness, Palpitations  Resp: No Dyspnea, No DOE, No Cough, No Hemoptysis, No Wheezing.    GI: No  Heartburn, Indigestion, Abdominal Pain, Nausea, Vomiting, Diarrhea, Hematemesis, Hematochezia, Melena, Change in Bowel Habits, +Loss of Appetite  GU: No Dysuria, Change in Color of Urine, No Urgency or Frequency, No Flank pain.  Musculoskeletal: No Joint Pain or Swelling, No Decreased Range of Motion, No Back Pain.  Neurologic: No Syncope, No Seizures, Muscle Weakness, Paresthesia, Vision Disturbance or Loss, No Diplopia, No Vertigo, No Difficulty Walking,  Skin: No Rash or Lesions. Psych: No Change in Mood or Affect, +Depression or Anxiety, No Memory loss, No Confusion, or Hallucinations   Past Medical History  Diagnosis Date  . Prostate cancer 2010    External beam radiation (urol - Risa Grill, XRT Valere Dross)    Past Surgical History  Procedure Laterality Date  . None       Prior to Admission medications   Medication Sig Start Date End Date Taking? Authorizing Provider  amLODipine (NORVASC) 10 MG tablet Take 1 tablet (10 mg total) by mouth daily. 11/05/13   Neena Rhymes, MD  Atorvastatin Calcium (LIPITOR PO) Take by mouth daily.    Historical Provider, MD     Allergies  Allergen Reactions  . Celebrex [Celecoxib]         HIVES  Social History:  reports that he has been smoking Cigarettes.  He has a 30 pack-year smoking history. He has never used smokeless tobacco. He reports that he drinks about 3.5 ounces of alcohol per week.  He reports that he does not use illicit drugs.     Family History  Problem Relation Age of Onset  . Cancer Mother     esophageal  . Hypertension Mother   . Diabetes Mother   . Cancer Father     prostate  . Heart disease Maternal Grandfather     MI  . Cancer Paternal Grandfather     prostate      Physical Exam:  GEN:  Pleasant Well Developed 68 y.o. Caucasian male examined and in no acute distress; cooperative with exam Filed Vitals:   07/01/14 2330 07/02/14 0000 07/02/14 0210 07/02/14 0215  BP: 125/59 115/70 134/68 120/96  Pulse: 91 94 78  75  Temp:      TempSrc:      Resp: 18 25 21 17   Height:      Weight:      SpO2: 96% 95% 94% 95%   Blood pressure 120/96, pulse 75, temperature 98.1 F (36.7 C), temperature source Oral, resp. rate 17, height 5' 10.5" (1.791 m), weight 97.523 kg (215 lb), SpO2 95.00%. PSYCH: He is alert and oriented x4; does not appear anxious does not appear depressed; affect is normal HEENT: Normocephalic and +Laceration to Superior Aspect of Left Eyebrow, Mucous membranes pink; PERRLA; EOM intact; Fundi:  Benign;  No scleral icterus, Nares: Patent, Oropharynx: Clear:  Fair Dentition, Neck:  FROM, No Cervical Lymphadenopathy nor Thyromegaly or Carotid Bruit; No JVD; Breasts:: Not examined CHEST WALL: No tenderness CHEST: Normal respiration, clear to auscultation bilaterally HEART: Regular rate and rhythm; no murmurs rubs or gallops BACK: No kyphosis or scoliosis; No CVA tenderness ABDOMEN: Positive Bowel Sounds, Soft Non-Tender; No Masses, No Organomegaly.   Rectal Exam: Not done EXTREMITIES: No Cyanosis, Clubbing, or Edema; No Ulcerations. Genitalia: not examined PULSES: 2+ and symmetric SKIN: Normal hydration no rash or ulceration CNS:  Alert, Oriented, Thought Content Appropriate. Speech Fluent without evidence of Aphasia. Able to follow 3 step commands without difficulty.  In No obvious pain. CNs Intact except decreased Hearing Motor and Sensory and Cerebellar Fxn Intact.   DTR 2/4 No Focal Deficits.   Gait: deferred  Vascular: pulses palpable throughout    Labs on Admission:  Basic Metabolic Panel:  Recent Labs Lab 07/01/14 2328  NA 139  K 3.4*  CL 98  GLUCOSE 125*  BUN 10  CREATININE 1.10   Liver Function Tests: No results found for this basename: AST, ALT, ALKPHOS, BILITOT, PROT, ALBUMIN,  in the last 168 hours No results found for this basename: LIPASE, AMYLASE,  in the last 168 hours No results found for this basename: AMMONIA,  in the last 168 hours CBC:  Recent Labs Lab  07/01/14 2323 07/01/14 2328  WBC 4.3  --   HGB 13.4 13.9  HCT 36.3* 41.0  MCV 102.5*  --   PLT 164  --    Cardiac Enzymes: No results found for this basename: CKTOTAL, CKMB, CKMBINDEX, TROPONINI,  in the last 168 hours  BNP (last 3 results) No results found for this basename: PROBNP,  in the last 8760 hours CBG:  Recent Labs Lab 07/02/14 0004  GLUCAP 119*    Radiological Exams on Admission: Dg Chest 2 View  07/01/2014   CLINICAL DATA:  Syncopal F cell falling for do not image chest  EXAM: CHEST  2 VIEW  COMPARISON:  None.  FINDINGS: The right hemidiaphragm is higher than the left. There is atelectasis anteriorly and posteriorly at the left lung base. There is  no pneumothorax or pleural effusion. The right lung is clear. The cardiac silhouette is top-normal in size. The pulmonary vascularity is normal. There is mild tortuosity of the descending thoracic aorta. The observed portions of the ribs exhibit no acute abnormalities. The thoracic vertebral bodies are preserved in height.  IMPRESSION: There is atelectasis at the left lung base. There is no pneumothorax nor acute rib fracture demonstrated.   Electronically Signed   By: David  Martinique   On: 07/01/2014 15:21   Ct Head Wo Contrast  07/02/2014   CLINICAL DATA:  Syncope and head injury  EXAM: CT HEAD WITHOUT CONTRAST  TECHNIQUE: Contiguous axial images were obtained from the base of the skull through the vertex without intravenous contrast.  COMPARISON:  01/07/2014 temporal bone CT  FINDINGS: Skull and Sinuses:There is a large left frontotemporal scalp laceration and contusion. No underlying fracture.  Probable mucous retention cyst in the posterior left maxillary sinus. An infraorbital air cell on the left may limit left maxillary sinus outflow. Chronic mucosal thickening and neoosteogenesis of the right maxillary sinus. These changes of chronic sinusitis are stable from previous.  Orbits: No acute abnormality.  Brain: No evidence of acute  abnormality, such as acute infarction, hemorrhage, hydrocephalus, or mass lesion/mass effect. Generalized cerebral and cerebellar volume loss which is borderline age advanced. There is mild chronic small vessel disease with patchy deep and periventricular cerebral white matter low density.  IMPRESSION: 1. No acute intracranial injury. 2. Left frontotemporal scalp contusion.  No calvarial fracture. 3. Chronic findings are noted above.   Electronically Signed   By: Jorje Guild M.D.   On: 07/02/2014 01:39     EKG: Independently reviewed. Sinus Rhyhm, Rate 82 no Acute S-T changes,  Diffuse Artifact    Assessment/Plan:   68 y.o. male with  Principal Problem: 1.   Syncope and collapse  - Admitted for syncope workup,  Cardiac monitoring, cycle troponins, Neuro and Orthostatic Vital sign checks,       Carotid US and 2 D ECHO in AM.  Most Likely due to Orthostatic Hypotension.   IVFs for hydration   Active Problems: 2.   Orthostatic Hypotension:    - due to Poor PO Intake and Increased Alcohol Intake.   IVFs for Rehydration, and IV Ativan per CIWA Protocol   3.   Scalp laceration:   (Above Left Eyebrow)  - wound Care PRN.     4.   Alcohol abuse:     - placed on the CIWA Protocol with IV Ativan,   Needs referral for Alcohol Rehab/Counselling.       Labs reveal an ETOH Level of 155, an elevated AST( 101) > ALT (55),  and and Macrocytosis (MCV =102.5),      all indicative of ETOH Abuse and probable Liver  Disease.      5.   DVT Prophylaxis with Lovenox, Monitor Platelets.        Code Status:  FULL CODE     Family Communication:     Disposition Plan:         Time spent:  [place] [facility] Pager [phone]   If [place] Please Contact the Day Rounding Team MD for [facility]  If 7PM-7AM, Please Contact night-coverage  [email or web address] Password [email or web address] 07/02/2014, 2:45 AM

## 2014-07-02 NOTE — Progress Notes (Signed)
Patient seen and evaluated earlier this AM by my associate.   Will reassess next am.  Velvet Bathe

## 2014-07-02 NOTE — Progress Notes (Signed)
Pt girlfriend reported that when she tried to help pt use the urinal she noticed blood in urine. On assessment there is no acute bleeding noted. MD made aware. Will continue to monitor.  Ferdinand Lango, RN

## 2014-07-03 DIAGNOSIS — J320 Chronic maxillary sinusitis: Secondary | ICD-10-CM | POA: Diagnosis present

## 2014-07-03 DIAGNOSIS — I658 Occlusion and stenosis of other precerebral arteries: Secondary | ICD-10-CM | POA: Diagnosis not present

## 2014-07-03 DIAGNOSIS — R55 Syncope and collapse: Secondary | ICD-10-CM | POA: Diagnosis not present

## 2014-07-03 DIAGNOSIS — S058X9A Other injuries of unspecified eye and orbit, initial encounter: Secondary | ICD-10-CM | POA: Diagnosis not present

## 2014-07-03 DIAGNOSIS — Z79899 Other long term (current) drug therapy: Secondary | ICD-10-CM | POA: Diagnosis not present

## 2014-07-03 DIAGNOSIS — F17200 Nicotine dependence, unspecified, uncomplicated: Secondary | ICD-10-CM | POA: Diagnosis present

## 2014-07-03 DIAGNOSIS — E860 Dehydration: Secondary | ICD-10-CM | POA: Diagnosis present

## 2014-07-03 DIAGNOSIS — H9190 Unspecified hearing loss, unspecified ear: Secondary | ICD-10-CM | POA: Diagnosis not present

## 2014-07-03 DIAGNOSIS — Y92009 Unspecified place in unspecified non-institutional (private) residence as the place of occurrence of the external cause: Secondary | ICD-10-CM | POA: Diagnosis not present

## 2014-07-03 DIAGNOSIS — I6330 Cerebral infarction due to thrombosis of unspecified cerebral artery: Secondary | ICD-10-CM | POA: Diagnosis not present

## 2014-07-03 DIAGNOSIS — S0100XA Unspecified open wound of scalp, initial encounter: Secondary | ICD-10-CM | POA: Diagnosis present

## 2014-07-03 DIAGNOSIS — W1809XA Striking against other object with subsequent fall, initial encounter: Secondary | ICD-10-CM | POA: Diagnosis present

## 2014-07-03 DIAGNOSIS — Z923 Personal history of irradiation: Secondary | ICD-10-CM | POA: Diagnosis not present

## 2014-07-03 DIAGNOSIS — S0180XA Unspecified open wound of other part of head, initial encounter: Secondary | ICD-10-CM | POA: Diagnosis not present

## 2014-07-03 DIAGNOSIS — I951 Orthostatic hypotension: Secondary | ICD-10-CM | POA: Diagnosis not present

## 2014-07-03 DIAGNOSIS — I6529 Occlusion and stenosis of unspecified carotid artery: Secondary | ICD-10-CM | POA: Diagnosis not present

## 2014-07-03 DIAGNOSIS — F3289 Other specified depressive episodes: Secondary | ICD-10-CM | POA: Diagnosis present

## 2014-07-03 DIAGNOSIS — H8110 Benign paroxysmal vertigo, unspecified ear: Secondary | ICD-10-CM | POA: Diagnosis present

## 2014-07-03 DIAGNOSIS — E876 Hypokalemia: Secondary | ICD-10-CM | POA: Diagnosis present

## 2014-07-03 DIAGNOSIS — F1010 Alcohol abuse, uncomplicated: Secondary | ICD-10-CM | POA: Diagnosis present

## 2014-07-03 DIAGNOSIS — Z5189 Encounter for other specified aftercare: Secondary | ICD-10-CM | POA: Diagnosis not present

## 2014-07-03 DIAGNOSIS — E785 Hyperlipidemia, unspecified: Secondary | ICD-10-CM | POA: Diagnosis present

## 2014-07-03 DIAGNOSIS — F329 Major depressive disorder, single episode, unspecified: Secondary | ICD-10-CM | POA: Diagnosis present

## 2014-07-03 DIAGNOSIS — Z8546 Personal history of malignant neoplasm of prostate: Secondary | ICD-10-CM | POA: Diagnosis not present

## 2014-07-03 DIAGNOSIS — F411 Generalized anxiety disorder: Secondary | ICD-10-CM | POA: Diagnosis present

## 2014-07-03 DIAGNOSIS — I6350 Cerebral infarction due to unspecified occlusion or stenosis of unspecified cerebral artery: Secondary | ICD-10-CM | POA: Diagnosis present

## 2014-07-03 LAB — CBC
HCT: 34.4 % — ABNORMAL LOW (ref 39.0–52.0)
HEMATOCRIT: 36.4 % — AB (ref 39.0–52.0)
HEMATOCRIT: 36.5 % — AB (ref 39.0–52.0)
HEMOGLOBIN: 12.8 g/dL — AB (ref 13.0–17.0)
Hemoglobin: 12.3 g/dL — ABNORMAL LOW (ref 13.0–17.0)
Hemoglobin: 12.8 g/dL — ABNORMAL LOW (ref 13.0–17.0)
MCH: 35.7 pg — ABNORMAL HIGH (ref 26.0–34.0)
MCH: 35.3 pg — ABNORMAL HIGH (ref 26.0–34.0)
MCH: 35.3 pg — AB (ref 26.0–34.0)
MCHC: 35.1 g/dL (ref 30.0–36.0)
MCHC: 35.2 g/dL (ref 30.0–36.0)
MCHC: 35.8 g/dL (ref 30.0–36.0)
MCV: 100.6 fL — AB (ref 78.0–100.0)
MCV: 101.4 fL — ABNORMAL HIGH (ref 78.0–100.0)
MCV: 98.9 fL (ref 78.0–100.0)
Platelets: 164 10*3/uL (ref 150–400)
Platelets: 163 10*3/uL (ref 150–400)
Platelets: 167 10*3/uL (ref 150–400)
RBC: 3.59 MIL/uL — ABNORMAL LOW (ref 4.22–5.81)
RBC: 3.63 MIL/uL — ABNORMAL LOW (ref 4.22–5.81)
RBC: 3.48 MIL/uL — AB (ref 4.22–5.81)
RDW: 14.2 % (ref 11.5–15.5)
RDW: 14 % (ref 11.5–15.5)
RDW: 13.8 % (ref 11.5–15.5)
WBC: 5 10*3/uL (ref 4.0–10.5)
WBC: 5 10*3/uL (ref 4.0–10.5)
WBC: 5.4 10*3/uL (ref 4.0–10.5)

## 2014-07-03 LAB — BASIC METABOLIC PANEL
Anion gap: 17 — ABNORMAL HIGH (ref 5–15)
BUN: 6 mg/dL (ref 6–23)
CHLORIDE: 97 meq/L (ref 96–112)
CO2: 24 mEq/L (ref 19–32)
Calcium: 8.9 mg/dL (ref 8.4–10.5)
Creatinine, Ser: 0.53 mg/dL (ref 0.50–1.35)
GFR calc Af Amer: >90 mL/min (ref >90)
GFR calc non Af Amer: >90 mL/min (ref >90)
GLUCOSE: 95 mg/dL (ref 70–99)
POTASSIUM: 3.3 meq/L — AB (ref 3.7–5.3)
Sodium: 138 mEq/L (ref 137–147)

## 2014-07-03 MED ORDER — POTASSIUM CHLORIDE CRYS ER 20 MEQ PO TBCR
40.0000 meq | EXTENDED_RELEASE_TABLET | Freq: Once | ORAL | Status: AC
  Administered 2014-07-03: 40 meq via ORAL
  Filled 2014-07-03 (×2): qty 2

## 2014-07-03 MED ORDER — LORAZEPAM 2 MG/ML IJ SOLN
1.0000 mg | Freq: Once | INTRAMUSCULAR | Status: AC
  Administered 2014-07-04: 1 mg via INTRAVENOUS
  Filled 2014-07-03: qty 1

## 2014-07-03 MED ORDER — ENSURE PUDDING PO PUDG
1.0000 | Freq: Two times a day (BID) | ORAL | Status: DC
  Administered 2014-07-04 – 2014-07-07 (×8): 1 via ORAL

## 2014-07-03 NOTE — Progress Notes (Signed)
[facility] PROGRESS NOTE  Joshua Henson [facility] HYI:[id number] DOB: 1946-05-17 DOA: 07/01/2014 PCP: Adella Hare, MD  Assessment/Plan:  Principal Problem:   Syncope and collapse - orthostatic vitals - resolving with IVF administration - PT evaluation - Pt has history of dizziness related to inner ear problems per discussion with significant other. Given complaints of dizziness and recent head trauma will investigate further with MRI of head  Active Problems: Hypokalemia - Replaced orally - reassess next am    Scalp laceration - CT showed no fracture    Alcohol abuse - on CIWA protocol   Code Status: full Family Communication: discussed with patient and significant other at bedside. Disposition Plan: Pending results of  Work up  Consultants:  None  Procedures:  CT scan of head  Carotid doppler  Antibiotics:  None  HPI/Subjective: Pt complaining of dizziness when he stands up.  Objective: Filed Vitals:   07/03/14 1451  BP: 139/87  Pulse: 60  Temp: 98.2 F (36.8 C)  Resp: 16    Intake/Output Summary (Last 24 hours) at 07/03/14 1653 Last data filed at 07/03/14 1530  Gross per 24 hour  Intake      0 ml  Output   3300 ml  Net  -3300 ml   Filed Weights   07/01/14 2304 07/02/14 0335 07/03/14 0400  Weight: 97.523 kg (215 lb) 93.033 kg (205 lb 1.6 oz) 90.402 kg (199 lb 4.8 oz)    Exam:   General:  Pt in NAD, alert and awake  Cardiovascular: rrr, no mrg  Respiratory: CTA BL, no wheezes  Abdomen: obese, nt  Musculoskeletal: no cyanosis or clubbing   Data Reviewed: Basic Metabolic Panel:  Recent Labs Lab 07/01/14 2328 07/02/14 0301 07/02/14 1000 07/03/14 0945  NA 139 140 144 138  K 3.4* 3.6* 3.3* 3.3*  CL 98 98 103 97  CO2  --  21 25 24   GLUCOSE 125* 113* 111* 95  BUN 10 10 9 6   CREATININE 1.10 0.58 0.57 0.53  CALCIUM  --  8.8 8.9 8.9   Liver Function Tests:  Recent Labs Lab 07/02/14 0301  AST 101*  ALT 55*  ALKPHOS 61   BILITOT 0.6  PROT 6.4  ALBUMIN 3.3*   No results found for this basename: LIPASE, AMYLASE,  in the last 168 hours No results found for this basename: AMMONIA,  in the last 168 hours CBC:  Recent Labs Lab 07/02/14 1000 07/02/14 1826 07/03/14 0057 07/03/14 0845 07/03/14 1545  WBC 5.8 5.6 5.0 5.0 5.4  HGB 13.0 12.7* 12.8* 12.3* 12.8*  HCT 35.9* 36.3* 36.4* 34.4* 36.5*  MCV 98.6 101.1* 101.4* 98.9 100.6*  PLT 163 161 164 163 167   Cardiac Enzymes:  Recent Labs Lab 07/02/14 0301 07/02/14 0816 07/02/14 1455  TROPONINI <0.30 <0.30 <0.30   BNP (last 3 results) No results found for this basename: PROBNP,  in the last 8760 hours CBG:  Recent Labs Lab 07/02/14 0004  GLUCAP 119*    No results found for this or any previous visit (from the past 240 hour(s)).   Studies: Ct Head Wo Contrast  07/02/2014   CLINICAL DATA:  Syncope and head injury  EXAM: CT HEAD WITHOUT CONTRAST  TECHNIQUE: Contiguous axial images were obtained from the base of the skull through the vertex without intravenous contrast.  COMPARISON:  01/07/2014 temporal bone CT  FINDINGS: Skull and Sinuses:There is a large left frontotemporal scalp laceration and contusion. No underlying fracture.  Probable mucous retention cyst in the posterior  left maxillary sinus. An infraorbital air cell on the left may limit left maxillary sinus outflow. Chronic mucosal thickening and neoosteogenesis of the right maxillary sinus. These changes of chronic sinusitis are stable from previous.  Orbits: No acute abnormality.  Brain: No evidence of acute abnormality, such as acute infarction, hemorrhage, hydrocephalus, or mass lesion/mass effect. Generalized cerebral and cerebellar volume loss which is borderline age advanced. There is mild chronic small vessel disease with patchy deep and periventricular cerebral white matter low density.  IMPRESSION: 1. No acute intracranial injury. 2. Left frontotemporal scalp contusion.  No calvarial  fracture. 3. Chronic findings are noted above.   Electronically Signed   By: Jorje Guild M.D.   On: 07/02/2014 01:39    Scheduled Meds: . enoxaparin (LOVENOX) injection  40 mg Subcutaneous Q24H  . [START ON 07/04/2014] feeding supplement (ENSURE)  1 Container Oral BID BM  . folic acid  1 mg Oral Daily  . LORazepam  0-4 mg Intravenous Q6H   Followed by  . [START ON 07/04/2014] LORazepam  0-4 mg Intravenous Q12H  . multivitamin with minerals  1 tablet Oral Daily  . thiamine  100 mg Oral Daily   Continuous Infusions: . sodium chloride 100 mL/hr at 07/03/14 0230     Time spent: > 35 minutes    Velvet Bathe  [facility] Pager [phone]. If 7PM-7AM, please contact night-coverage at [email or web address], password [email or web address] 07/03/2014, 4:53 PM  LOS: 2 days

## 2014-07-03 NOTE — Evaluation (Addendum)
Physical Therapy Evaluation Patient Details Name: Joshua Henson MRN: [id number] DOB: 09-28-1946 Today's Date: 07/03/2014   History of Present Illness  68 y.o. male with remote history of Prostate Cancer S/P Radiation Rx who presents to the ED after he got up to night to go into his kitchen and he passed out and hit his head on the kitchen counter and suffered a laceration to his left eyebrow area.  When he arrived at the ED, he was evaluated and found to have orthostatic hypotension with systolic blood pressures of that dropped from 140/60 to 60/40.    Clinical Impression  Pt adm from home due to above. Pt presents with decreased ability to tolerate transfers and ambulate secondary to dizziness. Orthostatics were assessed; BP in lying 146/87; BP in sitting EOB 127/89; BP in standing 139/99. Pt to benefit from acute PT to address deficits indicated below and maximize functional mobility prior to returning home with significant other. Will recommend a vestibular PT assessment due to c/o dizziness worsening with positional changes (RN made aware). Pt c/o new "inner ear diagnosis recently" and reports balance has not been the same since. significant other insistent pt get MRI prior to D/C; RN made aware.    Follow Up Recommendations Outpatient PT;Supervision/Assistance - 24 hour;Other (comment) (possible vestibular/neuro OPPT)    Equipment Recommendations  Other (comment) (TBD with gt training )    Recommendations for Other Services       Precautions / Restrictions Precautions Precautions: Fall Precaution Comments: multiple syncopal episodes  Restrictions Weight Bearing Restrictions: No      Mobility  Bed Mobility Overal bed mobility: Modified Independent             General bed mobility comments: relies heavily on handrails; ataxic movements in bed   Transfers Overall transfer level: Needs assistance Equipment used: 1 person hand held assist Transfers: Sit to/from  Stand;Stand Pivot Transfers Sit to Stand: Mod assist Stand pivot transfers: Mod assist       General transfer comment: performed sit to stand x 2; unable to tolerate standing intially; required sitting; orthostatics checked (see vitals) ; pt with ataxic like movements and required UE support and (A) for all transfers; cues for safety and hand placement due to impulsiveness   Ambulation/Gait             General Gait Details: unable to tolerate due to dizziness   Stairs            Wheelchair Mobility    Modified Rankin (Stroke Patients Only)       Balance Overall balance assessment: History of Falls;Needs assistance Sitting-balance support: Feet supported;Single extremity supported Sitting balance-Leahy Scale: Fair Sitting balance - Comments: required UE support; tolerated sitting EOB total ~10 min; orthostatics checked    Standing balance support: During functional activity;Bilateral upper extremity supported Standing balance-Leahy Scale: Poor Standing balance comment: requires UE support and (A) to maintain balance; tolerated initial standing ~1 min; 2nd standing trial for 2 min to check vitals; limited by dizziness; unsteady                              Pertinent Vitals/Pain See impression for BP vitals     Home Living Family/patient expects to be discharged to:: Private residence Living Arrangements: Spouse/significant other Available Help at Discharge: Family;Available 24 hours/day Type of Home: House Home Access: Stairs to enter   [employer] of Steps: 2-3 Home Layout: One level Home  Equipment: None      Prior Function Level of Independence: Independent               Hand Dominance        Extremity/Trunk Assessment   Upper Extremity Assessment: Defer to OT evaluation           Lower Extremity Assessment: Overall WFL for tasks assessed (LE tremors )      Cervical / Trunk Assessment: Normal  Communication    Communication: No difficulties  Cognition Arousal/Alertness: Awake/alert Behavior During Therapy: Impulsive Overall Cognitive Status: Impaired/Different from baseline Area of Impairment: Following commands;Safety/judgement       Following Commands: Follows multi-step commands with increased time Safety/Judgement: Decreased awareness of safety     General Comments: pt very anxious and impulsive; per significant other seems to be pt baseline     General Comments      Exercises        Assessment/Plan    PT Assessment Patient needs continued PT services  PT Diagnosis Difficulty walking   PT Problem List Decreased activity tolerance;Decreased balance;Decreased mobility;Decreased safety awareness;Decreased knowledge of use of DME  PT Treatment Interventions DME instruction;Gait training;Stair training;Functional mobility training;Therapeutic activities;Balance training;Neuromuscular re-education;Patient/family education   PT Goals (Current goals can be found in the Care Plan section) Acute Rehab PT Goals Patient Stated Goal: to figure out why im dizzy PT Goal Formulation: With patient Time For Goal Achievement: 07/10/14 Potential to Achieve Goals: Good    Frequency Min 3X/week   Barriers to discharge        Co-evaluation               End of Session Equipment Utilized During Treatment: Gait belt Activity Tolerance: Other (comment) (limited by dizziness ) Patient left: in chair;with call bell/phone within reach;with chair alarm set Nurse Communication: Mobility status;Other (comment) (Bp )         Time: 4580-9983 PT Time Calculation (min): 20 min   Charges:   PT Evaluation $Initial PT Evaluation Tier I: 1 Procedure PT Treatments $Therapeutic Activity: 8-22 mins   PT G CodesGustavus Bryant, [place]  [phone] 07/03/2014, 4:26 PM

## 2014-07-03 NOTE — Progress Notes (Signed)
Utilization review completed.  

## 2014-07-04 ENCOUNTER — Inpatient Hospital Stay (HOSPITAL_COMMUNITY): Payer: Medicare Other

## 2014-07-04 ENCOUNTER — Encounter (HOSPITAL_COMMUNITY): Payer: Self-pay | Admitting: *Deleted

## 2014-07-04 LAB — HEPATIC FUNCTION PANEL
ALT: 62 U/L — AB (ref 0–53)
AST: 115 U/L — ABNORMAL HIGH (ref 0–37)
Albumin: 3.3 g/dL — ABNORMAL LOW (ref 3.5–5.2)
Alkaline Phosphatase: 69 U/L (ref 39–117)
BILIRUBIN DIRECT: 0.3 mg/dL (ref 0.0–0.3)
BILIRUBIN INDIRECT: 0.7 mg/dL (ref 0.3–0.9)
TOTAL PROTEIN: 6.6 g/dL (ref 6.0–8.3)
Total Bilirubin: 1 mg/dL (ref 0.3–1.2)

## 2014-07-04 LAB — CBC
HCT: 36 % — ABNORMAL LOW (ref 39.0–52.0)
HEMATOCRIT: 36.3 % — AB (ref 39.0–52.0)
HEMATOCRIT: 37.3 % — AB (ref 39.0–52.0)
HEMOGLOBIN: 12.7 g/dL — AB (ref 13.0–17.0)
HEMOGLOBIN: 13.1 g/dL (ref 13.0–17.0)
Hemoglobin: 13.2 g/dL (ref 13.0–17.0)
MCH: 35.7 pg — ABNORMAL HIGH (ref 26.0–34.0)
MCH: 35.5 pg — ABNORMAL HIGH (ref 26.0–34.0)
MCH: 36.8 pg — AB (ref 26.0–34.0)
MCHC: 35.3 g/dL (ref 30.0–36.0)
MCHC: 35.1 g/dL (ref 30.0–36.0)
MCHC: 36.4 g/dL — AB (ref 30.0–36.0)
MCV: 101.1 fL — AB (ref 78.0–100.0)
MCV: 101.1 fL — ABNORMAL HIGH (ref 78.0–100.0)
MCV: 101.1 fL — AB (ref 78.0–100.0)
Platelets: 160 10*3/uL (ref 150–400)
Platelets: 156 10*3/uL (ref 150–400)
Platelets: 168 10*3/uL (ref 150–400)
RBC: 3.56 MIL/uL — AB (ref 4.22–5.81)
RBC: 3.59 MIL/uL — ABNORMAL LOW (ref 4.22–5.81)
RBC: 3.69 MIL/uL — AB (ref 4.22–5.81)
RDW: 13.9 % (ref 11.5–15.5)
RDW: 13.9 % (ref 11.5–15.5)
RDW: 13.6 % (ref 11.5–15.5)
WBC: 4.4 10*3/uL (ref 4.0–10.5)
WBC: 4.6 10*3/uL (ref 4.0–10.5)
WBC: 5.1 10*3/uL (ref 4.0–10.5)

## 2014-07-04 LAB — BASIC METABOLIC PANEL
Anion gap: 18 — ABNORMAL HIGH (ref 5–15)
BUN: 5 mg/dL — AB (ref 6–23)
CO2: 22 meq/L (ref 19–32)
CREATININE: 0.51 mg/dL (ref 0.50–1.35)
Calcium: 8.7 mg/dL (ref 8.4–10.5)
Chloride: 98 mEq/L (ref 96–112)
GFR calc Af Amer: >90 mL/min (ref >90)
GLUCOSE: 100 mg/dL — AB (ref 70–99)
Potassium: 3.3 mEq/L — ABNORMAL LOW (ref 3.7–5.3)
Sodium: 138 mEq/L (ref 137–147)

## 2014-07-04 MED ORDER — IOHEXOL 350 MG/ML SOLN
50.0000 mL | Freq: Once | INTRAVENOUS | Status: AC | PRN
  Administered 2014-07-04: 50 mL via INTRAVENOUS

## 2014-07-04 MED ORDER — ASPIRIN EC 81 MG PO TBEC
81.0000 mg | DELAYED_RELEASE_TABLET | Freq: Every day | ORAL | Status: DC
  Administered 2014-07-04 – 2014-07-07 (×4): 81 mg via ORAL
  Filled 2014-07-04 (×4): qty 1

## 2014-07-04 MED ORDER — POTASSIUM CHLORIDE CRYS ER 20 MEQ PO TBCR
40.0000 meq | EXTENDED_RELEASE_TABLET | Freq: Once | ORAL | Status: AC
  Administered 2014-07-04: 40 meq via ORAL
  Filled 2014-07-04: qty 2

## 2014-07-04 NOTE — Progress Notes (Signed)
07/04/14 1556  PT Visit Information  Last PT Received On 07/04/14  Assistance Needed +1  History of Present Illness 68 y.o. male with remote history of Prostate Cancer S/P Radiation Rx who presents to the ED after he got up to night to go into his kitchen and he passed out and hit his head on the kitchen counter and suffered a laceration to his left eyebrow area.  When he arrived at the ED, he was evaluated and found to have orthostatic hypotension with systolic blood pressures of that dropped from 140/60 to 60/40.  Pt with positive left frontal CVA.  Also positive for right BPPV.  Suspect hypofunction as well and will assess when BPPV is treated completely.    PT Time Calculation  PT Start Time 1352  PT Stop Time 1447  PT Time Calculation (min) 55 min  Subjective Data  Subjective "I feel so dizzy."  Precautions  Precautions Fall  Restrictions  Weight Bearing Restrictions No  Cognition  Arousal/Alertness Awake/alert  Behavior During Therapy Impulsive  Overall Cognitive Status Impaired/Different from baseline  Area of Impairment Following commands;Safety/judgement  Following Commands Follows multi-step commands with increased time  Safety/Judgement Decreased awareness of safety  General Comments pt very anxious and impulsive; per significant other seems to be pt baseline   Bed Mobility  Overal bed mobility Modified Independent  General bed mobility comments relies heavily on handrails; ataxic movements in bed   Transfers  Overall transfer level Needs assistance  Equipment used Rolling walker (2 wheeled)  Transfers Sit to/from Stand  Sit to Stand Mod assist  General transfer comment pt with ataxic like movements and required UE support and (A) for all transfers; cues for safety and hand placement due to impulsiveness   Ambulation/Gait  Ambulation/Gait assistance Min assist;Mod assist  Ambulation Distance (Feet) 35 Feet  Assistive device Rolling walker (2 wheeled)  Gait  Pattern/deviations Step-to pattern;Ataxic;Drifts right/left;Staggering left;Staggering right;Wide base of support;Trunk flexed  Gait velocity interpretation <1.8 ft/sec, indicative of risk for recurrent falls  General Gait Details Pt with very unsteady gait needing a lot of assist but was able with assist and cues to ambulate a short distance.  Needs constant cuing for sequencing and asisst to steer RW.    Modified Rankin (Stroke Patients Only)  Pre-Morbid Rankin Score 0  Modified Rankin 4  Balance  Overall balance assessment Needs assistance;History of Falls  Sitting-balance support Bilateral upper extremity supported;Feet supported  Sitting balance-Leahy Scale Poor  Sitting balance - Comments required UE support; tolerated sitting EOB total ~5 min  Standing balance support Bilateral upper extremity supported;During functional activity  Standing balance-Leahy Scale Poor  Standing balance comment Requires UE support to maintain balance.    General Comments  General comments (skin integrity, edema, etc.) Pt tested positive for right BPPV again but less intense.  Treated with Epley maneuver for second time.  Dizzy 7/10 and at end of treatment pt could not give a number.    Exercises  Exercises Other exercises  Other Exercises  Other Exercises Educated pt and significant other re: handout of Epley, also educated re: Laruth Bouchard Daroff exercise for pt to perform if symptoms persist.    PT - End of Session  Equipment Utilized During Treatment Gait belt  Activity Tolerance Patient limited by fatigue (limited by dizziness)  Patient left in bed;with call bell/phone within reach;with bed alarm set;with family/visitor present  Nurse Communication Mobility status  PT - Assessment/Plan  PT Plan Discharge plan needs to be updated  PT  Frequency Min 3X/week  Recommendations for Other Services Rehab consult  Follow Up Recommendations CIR;Supervision/Assistance - 24 hour  PT equipment Other (comment) (TBA)   PT Goal Progression  Progress towards PT goals Progressing toward goals  PT General Charges  $$ ACUTE PT VISIT 1 Procedure  PT Treatments  $Gait Training 8-22 mins  $Therapeutic Exercise 8-22 mins  $Self Care/Home Management 8-22  $Physical Performance Test 8-22 mins   Overall, pt improved a little as pt able to ambulate.  Using compensation techniques as needed for ambulation.  Recommend REhab consult as pt does have complex situation with CVA as well as vestibular issues.  Thanks.  [facility] Acute Rehabilitation [phone] (pager)

## 2014-07-04 NOTE — Progress Notes (Signed)
[facility] PROGRESS NOTE  Joshua Henson [facility] SVX:[id number] DOB: 1946-05-14 DOA: 07/01/2014 PCP: Adella Hare, MD Brief Narrative: 68 year old with history of prostate cancer status post radiation who presented to the ED after a syncopal episode of which he hit his head on the kitchen counter and suffered laceration to his left eyebrow. Reportedly patient had been drinking heavily prior to admission and on further evaluation was found to have orthostatic blood pressure changes. MRI evaluation of the head revealed new stroke of which cardiology has been consulted.  Assessment/Plan:  Principal Problem:   Syncope and collapse - orthostatic vitals - resolving with IVF administration - PT evaluation recommending outpatient PT  Stroke - Consulted Neurology for further evaluation and recommendations. - No reports to me regarding localizing or lateralizing symptoms while under my care and his main concern was dizziness of which I obtained MRI for further evaluation and subsequently found punctate nonhemorrhagic infarct.  Active Problems: Hypokalemia - Replaced orally - reassess next am    Scalp laceration - CT showed no fracture    Alcohol abuse - on CIWA protocol   Code Status: full Family Communication: discussed with patient and significant other at bedside. Disposition Plan: Pending results of  Work up  Consultants:  Neurology  Procedures:  CT scan of head  Carotid doppler  MRI  Antibiotics:  None  HPI/Subjective: No new complaints reported to me today.  Objective: Filed Vitals:   07/04/14 1224  BP: 152/69  Pulse: 76  Temp:   Resp:     Intake/Output Summary (Last 24 hours) at 07/04/14 1715 Last data filed at 07/04/14 1100  Gross per 24 hour  Intake    360 ml  Output   2300 ml  Net  -1940 ml   Filed Weights   07/01/14 2304 07/02/14 0335 07/03/14 0400  Weight: 97.523 kg (215 lb) 93.033 kg (205 lb 1.6 oz) 90.402 kg (199 lb 4.8 oz)     Exam:   General:  Pt in NAD, alert and awake  Cardiovascular: No cyanotic upper extremities  Respiratory: no increased wob, no wheezes  Abdomen: obese, ND  Musculoskeletal: no cyanosis or clubbing   Data Reviewed: Basic Metabolic Panel:  Recent Labs Lab 07/01/14 2328 07/02/14 0301 07/02/14 1000 07/03/14 0945 07/04/14 0020  NA 139 140 144 138 138  K 3.4* 3.6* 3.3* 3.3* 3.3*  CL 98 98 103 97 98  CO2  --  21 25 24 22   GLUCOSE 125* 113* 111* 95 100*  BUN 10 10 9 6  5*  CREATININE 1.10 0.58 0.57 0.53 0.51  CALCIUM  --  8.8 8.9 8.9 8.7   Liver Function Tests:  Recent Labs Lab 07/02/14 0301  AST 101*  ALT 55*  ALKPHOS 61  BILITOT 0.6  PROT 6.4  ALBUMIN 3.3*   No results found for this basename: LIPASE, AMYLASE,  in the last 168 hours No results found for this basename: AMMONIA,  in the last 168 hours CBC:  Recent Labs Lab 07/03/14 0057 07/03/14 0845 07/03/14 1545 07/04/14 0020 07/04/14 0808  WBC 5.0 5.0 5.4 4.4 5.1  HGB 12.8* 12.3* 12.8* 12.7* 13.2  HCT 36.4* 34.4* 36.5* 36.0* 36.3*  MCV 101.4* 98.9 100.6* 101.1* 101.1*  PLT 164 163 167 160 156   Cardiac Enzymes:  Recent Labs Lab 07/02/14 0301 07/02/14 0816 07/02/14 1455  TROPONINI <0.30 <0.30 <0.30   BNP (last 3 results) No results found for this basename: PROBNP,  in the last 8760 hours CBG:  Recent Labs Lab 07/02/14  0004  GLUCAP 119*    No results found for this or any previous visit (from the past 240 hour(s)).   Studies: Ct Angio Head W/cm &/or Wo Cm  07/04/2014   CLINICAL DATA:  Left frontal stroke. Recent fall with head trauma. Weakness.  EXAM: CT ANGIOGRAPHY HEAD AND NECK  TECHNIQUE: Multidetector CT imaging of the head and neck was performed using the standard protocol during bolus administration of intravenous contrast. Multiplanar CT image reconstructions and MIPs were obtained to evaluate the vascular anatomy. Carotid stenosis measurements (when applicable) are obtained  utilizing NASCET criteria, using the distal internal carotid diameter as the denominator.  CONTRAST:  48mL OMNIPAQUE IOHEXOL 350 MG/ML SOLN  COMPARISON:  MRI same day.  Head CT 07/02/2014  FINDINGS: CTA NECK FINDINGS  Lung apices show mild scarring. Degenerated articulation of the sternum and left rib indents the lung and as associated with some pulmonary scarring.  There is mild atherosclerosis of the aorta. Branching pattern of the brachiocephalic vessels from the arch is normal. No origin stenoses.  The right common carotid artery is widely patent to the bifurcation. There is mild atherosclerotic calcification at the carotid bifurcation. Minimal diameter of the proximal ICA is 5 mm, the same as the more distal cervical ICA. Therefore, there is no stenosis.  The left common carotid artery is widely patent to the bifurcation. There is calcified plaque of the distal common carotid artery in the proximal internal carotid artery. Minimal diameter of the proximal ICA is 5 mm. Compared to a more distal cervical ICA diameter of 5 mm, there is no stenosis.  Both vertebral artery origins are widely patent. Both vertebral arteries appear widely patent through the cervical region.  No soft tissue lesion of the neck is evident. There is ordinary degenerative change of the cervical spine.  Review of the MIP images confirms the above findings.  CTA HEAD FINDINGS  Both internal carotid arteries are patent through the siphon regions. There is some wall calcification but there is no stenosis. The anterior and middle cerebral vessels are patent bilaterally without proximal stenosis, aneurysm or vascular malformation.  Both vertebral arteries are patent to the basilar. No basilar stenosis. Posterior circulation branch vessels are normal.  Intracranial venous structures are patent and normal.  Chronic small vessel ischemic changes remain evident within the hemispheric white matter. No acute abnormality identifiable by CT.  Review of  the MIP images confirms the above findings.  IMPRESSION: Ordinary atherosclerotic calcification at both carotid bifurcations but no stenosis.   Electronically Signed   By: Nelson Chimes M.D.   On: 07/04/2014 17:12   Ct Angio Neck W/cm &/or Wo/cm  07/04/2014   CLINICAL DATA:  Left frontal stroke. Recent fall with head trauma. Weakness.  EXAM: CT ANGIOGRAPHY HEAD AND NECK  TECHNIQUE: Multidetector CT imaging of the head and neck was performed using the standard protocol during bolus administration of intravenous contrast. Multiplanar CT image reconstructions and MIPs were obtained to evaluate the vascular anatomy. Carotid stenosis measurements (when applicable) are obtained utilizing NASCET criteria, using the distal internal carotid diameter as the denominator.  CONTRAST:  48mL OMNIPAQUE IOHEXOL 350 MG/ML SOLN  COMPARISON:  MRI same day.  Head CT 07/02/2014  FINDINGS: CTA NECK FINDINGS  Lung apices show mild scarring. Degenerated articulation of the sternum and left rib indents the lung and as associated with some pulmonary scarring.  There is mild atherosclerosis of the aorta. Branching pattern of the brachiocephalic vessels from the arch is normal. No origin  stenoses.  The right common carotid artery is widely patent to the bifurcation. There is mild atherosclerotic calcification at the carotid bifurcation. Minimal diameter of the proximal ICA is 5 mm, the same as the more distal cervical ICA. Therefore, there is no stenosis.  The left common carotid artery is widely patent to the bifurcation. There is calcified plaque of the distal common carotid artery in the proximal internal carotid artery. Minimal diameter of the proximal ICA is 5 mm. Compared to a more distal cervical ICA diameter of 5 mm, there is no stenosis.  Both vertebral artery origins are widely patent. Both vertebral arteries appear widely patent through the cervical region.  No soft tissue lesion of the neck is evident. There is ordinary  degenerative change of the cervical spine.  Review of the MIP images confirms the above findings.  CTA HEAD FINDINGS  Both internal carotid arteries are patent through the siphon regions. There is some wall calcification but there is no stenosis. The anterior and middle cerebral vessels are patent bilaterally without proximal stenosis, aneurysm or vascular malformation.  Both vertebral arteries are patent to the basilar. No basilar stenosis. Posterior circulation branch vessels are normal.  Intracranial venous structures are patent and normal.  Chronic small vessel ischemic changes remain evident within the hemispheric white matter. No acute abnormality identifiable by CT.  Review of the MIP images confirms the above findings.  IMPRESSION: Ordinary atherosclerotic calcification at both carotid bifurcations but no stenosis.   Electronically Signed   By: Nelson Chimes M.D.   On: 07/04/2014 17:12   Mr Brain Wo Contrast  07/04/2014   CLINICAL DATA:  Dizziness. Syncopal episode with trauma to head. Laceration over the left orbit. Orthostatics hypotension.  EXAM: MRI HEAD WITHOUT CONTRAST  TECHNIQUE: Multiplanar, multiecho pulse sequences of the brain and surrounding structures were obtained without intravenous contrast.  COMPARISON:  CT head without contrast 07/02/2014  FINDINGS: A punctate area of restricted diffusion is noted in the anterior left frontal lobe on image 20 of series 6. Mild periventricular white matter changes are noted bilaterally. There is a remote subcortical white matter infarct of the anterior left frontal lobe measuring 8.5 mm.  No hemorrhage or mass lesion is present. The ventricles are of normal size. No significant extraaxial fluid collection is present.  Flow is present in the major intracranial arteries. The globes and orbits are intact. A polyp or mucous retention cyst is noted posteriorly in the left maxillary sinus. Chronic right maxillary sinus changes are evident. The remaining  paranasal sinuses and the mastoid air cells are clear.  IMPRESSION: 1. Punctate nonhemorrhagic infarct involving the anterior left frontal lobe. 2. Remote 8 mm left frontal lobe white matter infarct. 3. Mild periventricular white matter changes bilaterally. 4. 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   Electronically Signed   By: Lawrence Santiago M.D.   On: 07/04/2014 11:12    Scheduled Meds: . aspirin EC  81 mg Oral Daily  . enoxaparin (LOVENOX) injection  40 mg Subcutaneous Q24H  . feeding supplement (ENSURE)  1 Container Oral BID BM  . folic acid  1 mg Oral Daily  . LORazepam  0-4 mg Intravenous Q12H  . multivitamin with minerals  1 tablet Oral Daily  . thiamine  100 mg Oral Daily   Continuous Infusions: . sodium chloride 100 mL/hr at 07/03/14 2233     Time spent: > 35 minutes    Velvet Bathe  [facility] Pager [phone]. If 7PM-7AM, please contact  night-coverage at [email or web address], password [facility] 07/04/2014, 5:15 PM  LOS: 3 days

## 2014-07-04 NOTE — Progress Notes (Signed)
PT Note Pt positive for right BPPV.  Treated via Epley maneuver.  Will return after 1 pm to assess success with treatment and to give handouts.  Full note to follow.  Thanks.   [facility] Acute Rehabilitation [phone] (pager)

## 2014-07-04 NOTE — Progress Notes (Signed)
Physical Therapy Treatment Patient Details Name: Joshua Henson MRN: [id number] DOB: [birth year] Today's Date: 07/04/2014    History of Present Illness 68 y.o. male with remote history of Prostate Cancer S/P Radiation Rx who presents to the ED after he got up to night to go into his kitchen and he passed out and hit his head on the kitchen counter and suffered a laceration to his left eyebrow area.  When he arrived at the ED, he was evaluated and found to have orthostatic hypotension with systolic blood pressures of that dropped from 140/60 to 60/40.  Pt with positive left frontal CVA.  Also positive for right BPPV.  Suspect hypofunction as well and will assess when BPPV is treated completely.      PT Comments    Pt admitted with above. Pt currently with functional limitations due to balance and endurance deficits.  Pt will benefit from skilled PT to increase their independence and safety with mobility to allow discharge to the venue listed below.    Follow Up Recommendations  Outpatient PT;Supervision/Assistance - 24 hour (Vestibular Rehab)     Equipment Recommendations  Other (comment) (TBA)    Recommendations for Other Services       Precautions / Restrictions Precautions Precautions: Fall Precaution Comments: multiple syncopal episodes  Restrictions Weight Bearing Restrictions: No    Mobility  Bed Mobility Overal bed mobility: Modified Independent             General bed mobility comments: relies heavily on handrails; ataxic movements in bed   Transfers                    Ambulation/Gait                 Stairs            Wheelchair Mobility    Modified Rankin (Stroke Patients Only) Modified Rankin (Stroke Patients Only) Pre-Morbid Rankin Score: No symptoms Modified Rankin: Moderately severe disability     Balance Overall balance assessment: Needs assistance;History of Falls Sitting-balance support: Bilateral upper extremity  supported;Feet supported Sitting balance-Leahy Scale: Poor Sitting balance - Comments: required UE support; tolerated sitting EOB total ~5 min                            Cognition Arousal/Alertness: Awake/alert Behavior During Therapy: Impulsive Overall Cognitive Status: Impaired/Different from baseline Area of Impairment: Following commands;Safety/judgement       Following Commands: Follows multi-step commands with increased time Safety/Judgement: Decreased awareness of safety     General Comments: pt very anxious and impulsive; per significant other seems to be pt baseline     Exercises      General Comments General comments (skin integrity, edema, etc.): Pt tested negative for supine head roll bil.  Pt with positive test for right BPPV.  Treated pt with Epley manuever.  Pt initially 9/10 dizzy and then 7/10 dizzy after treatment.        Pertinent Vitals/Pain VSS, No pain    Home Living                      Prior Function            PT Goals (current goals can now be found in the care plan section) Progress towards PT goals: Progressing toward goals    Frequency  Min 3X/week    PT Plan Current plan remains appropriate  Co-evaluation             End of Session   Activity Tolerance:  (linited by dizziness) Patient left: in bed;with call bell/phone within reach;with bed alarm set     Time: 0938-1829 PT Time Calculation (min): 29 min  Charges:  $Therapeutic Activity: 8-22 mins $[place] Rep Proc: 8-22 mins                    G Codes:      INGOLD,Joshua Henson 07/18/2014, 3:53 PM [facility] Acute Rehabilitation [phone] (pager)

## 2014-07-04 NOTE — Progress Notes (Signed)
Rehab Admissions Coordinator Note:  Patient was screened by Joshua Henson for appropriateness for an Inpatient Acute Rehab Consult.  At this time, we are recommending Inpatient Rehab consult.  Sylvester Minton, PT Rehabilitation Admissions Coordinator [phone]  

## 2014-07-04 NOTE — Care Management Note (Signed)
    Page 1 of 1   07/07/2014     12:14:43 PM CARE MANAGEMENT NOTE 07/07/2014  Patient:  Joshua Henson, Joshua Henson   Account Number:  [id number]  Date Initiated:  07/04/2014  Documentation initiated by:  GRAVES-BIGELOW,Viviano Bir  Subjective/Objective Assessment:   Pt admitted for syncope- ETOH abuse.     Action/Plan:   Pt agreeable to outpatient Rehab. Referral made via Epic and Rehab to call pt with time and date.   Anticipated DC Date:  07/05/2014   Anticipated DC Plan:  [place]  CM consult      Choice offered to / List presented to:             Status of service:  Completed, signed off Medicare Important Message given?  YES (If response is "NO", the following Medicare IM given date fields will be blank) Date Medicare IM given:  07/04/2014 Medicare IM given by:  GRAVES-BIGELOW,Jacora Hopkins Date Additional Medicare IM given:  07/07/2014 Additional Medicare IM given by:  Deanne Bedgood GRAVES-BIGELOW  Discharge Disposition:  IP REHAB FACILITY  Per UR Regulation:  Reviewed for med. necessity/level of care/duration of stay  If discussed at [place] of Stay Meetings, dates discussed:   07/08/2014    Comments:  07-07-14 1212 Jacqlyn Krauss, RN,BSN [phone] Pt will d/c to CIR today. No further needs from CM at this time.

## 2014-07-04 NOTE — Consult Note (Signed)
Referring Physician: Wendee Beavers    Chief Complaint: incidental stroke on MRI  HPI:                                                                                                                                         Joshua Henson is an 68 y.o. male with two episodes of syncope over the last 5 days. Patient was admitted to hospital for evaluation of syncope.  During work up MRI of brain revealed a punctate nonhemorrhagic infarct involving the anterior left frontal lobe and a remote 8 mm left frontal lobe white matter infarct.  Pateint has had no localizing or lateralizing symptoms while in hospital and does not recall any symptoms in the past. Neurology was asked to evaluate patient for stroke.   Date last known well: Unable to determine Time last known well: Unable to determine tPA Given: No: no LSN  Past Medical History  Diagnosis Date  . Prostate cancer 2010    External beam radiation (urol - Risa Grill, XRT Valere Dross)    Past Surgical History  Procedure Laterality Date  . None      Family History  Problem Relation Age of Onset  . Cancer Mother     esophageal  . Hypertension Mother   . Diabetes Mother   . Cancer Father     prostate  . Heart disease Maternal Grandfather     MI  . Cancer Paternal Grandfather     prostate   Social History:  reports that he has been smoking Cigarettes.  He has a 30 pack-year smoking history. He has never used smokeless tobacco. He reports that he drinks about 3.5 ounces of alcohol per week. He reports that he does not use illicit drugs.  Allergies:  Allergies  Allergen Reactions  . Celebrex [Celecoxib] Rash    Medications:                                                                                                                           Prior to Admission:  Prescriptions prior to admission  Medication Sig Dispense Refill  . amLODipine (NORVASC) 10 MG tablet Take 10 mg by mouth daily.      Marland Kitchen atorvastatin (LIPITOR) 10 MG tablet Take 10  mg by mouth daily.      . folic acid (FOLVITE) 1 MG tablet Take 1 mg by  mouth daily.      Marland Kitchen lisinopril-hydrochlorothiazide (PRINZIDE,ZESTORETIC) 20-12.5 MG per tablet Take 1 tablet by mouth daily.      Marland Kitchen LORazepam (ATIVAN) 1 MG tablet Take 1 mg by mouth 3 (three) times daily.      . tamsulosin (FLOMAX) 0.4 MG CAPS capsule Take 0.4 mg by mouth daily.      . Vortioxetine HBr (BRINTELLIX) 10 MG TABS Take 10 mg by mouth daily.       Scheduled: . aspirin EC  81 mg Oral Daily  . enoxaparin (LOVENOX) injection  40 mg Subcutaneous Q24H  . feeding supplement (ENSURE)  1 Container Oral BID BM  . folic acid  1 mg Oral Daily  . LORazepam  0-4 mg Intravenous Q12H  . multivitamin with minerals  1 tablet Oral Daily  . thiamine  100 mg Oral Daily    ROS:                                                                                                                                       History obtained from the patient and Wife  General ROS: negative for - chills, fatigue, fever, night sweats, weight gain or weight loss Psychological ROS: negative for - behavioral disorder, hallucinations, memory difficulties, mood swings or suicidal ideation Ophthalmic ROS: negative for - blurry vision, double vision, eye pain or loss of vision ENT ROS: negative for - epistaxis, nasal discharge, oral lesions, sore throat, tinnitus or vertigo Allergy and Immunology ROS: negative for - hives or itchy/watery eyes Hematological and Lymphatic ROS: negative for - bleeding problems, bruising or swollen lymph nodes Endocrine ROS: negative for - galactorrhea, hair pattern changes, polydipsia/polyuria or temperature intolerance Respiratory ROS: negative for - cough, hemoptysis, shortness of breath or wheezing Cardiovascular ROS: negative for - chest pain, dyspnea on exertion, edema or irregular heartbeat Gastrointestinal ROS: negative for - abdominal pain, diarrhea, hematemesis, nausea/vomiting or stool  incontinence Genito-Urinary ROS: negative for - dysuria, hematuria, incontinence or urinary frequency/urgency Musculoskeletal ROS: negative for - joint swelling or muscular weakness Neurological ROS: as noted in HPI Dermatological ROS: negative for rash and skin lesion changes  Neurologic Examination:                                                                                                      Blood pressure 152/69, pulse 76, temperature 98 F (36.7 C), temperature source Oral, resp. rate 16, height 5' 10.5" (1.791 m), weight 90.402 kg (199 lb 4.8 oz), SpO2  94.00%.  Physical Exam  Cardiovascular: Normal rate, regular rhythm and normal heart sounds.  Exam reveals no gallop.   No murmur heard. Respiratory: Effort normal and breath sounds normal.  GI: Soft. Bowel sounds are normal. There is no tenderness.  Musculoskeletal: Normal range of motion.     General: NAD Mental Status: Alert, oriented, thought content appropriate.  Speech fluent without evidence of aphasia.  Able to follow 3 step commands without difficulty. Cranial Nerves: II: Discs flat bilaterally; Visual fields grossly normal, pupils equal, round, reactive to light and accommodation III,IV, VI: ptosis not present, extra-ocular motions intact bilaterally V,VII: smile symmetric, facial light touch sensation normal bilaterally VIII: hearing normal bilaterally IX,X: gag reflex present XI: bilateral shoulder shrug XII: midline tongue extension without atrophy or fasciculations  Motor: Right : Upper extremity   5/5    Left:     Upper extremity   5/5  Lower extremity   5/5     Lower extremity   5/5 Tone and bulk:normal tone throughout; no atrophy noted Sensory: Pinprick and light touch intact throughout, bilaterally Deep Tendon Reflexes:  Right: Upper Extremity   Left: Upper extremity   biceps (C-5 to C-6) 2/4   biceps (C-5 to C-6) 2/4 tricep (C7) 2/4    triceps (C7) 2/4 Brachioradialis (C6) 2/4  Brachioradialis  (C6) 2/4  Lower Extremity Lower Extremity  quadriceps (L-2 to L-4) 2/4   quadriceps (L-2 to L-4) 2/4 Achilles (S1) 1/4   Achilles (S1) 1/4  Plantars: Right: downgoing   Left: downgoing Cerebellar: normal finger-to-nose,  normal heel-to-shin test Gait: not tested CV: pulses palpable throughout    Lab Results: Basic Metabolic Panel:  Recent Labs Lab 07/01/14 2328  07/02/14 0301 07/02/14 1000 07/03/14 0945 07/04/14 0020  NA 139  --  140 144 138 138  K 3.4*  --  3.6* 3.3* 3.3* 3.3*  CL 98  --  98 103 97 98  CO2  --   --  21 25 24 22   GLUCOSE 125*  --  113* 111* 95 100*  BUN 10  --  10 9 6  5*  CREATININE 1.10  --  0.58 0.57 0.53 0.51  CALCIUM  --   < > 8.8 8.9 8.9 8.7  < > = values in this interval not displayed.  Liver Function Tests:  Recent Labs Lab 07/02/14 0301  AST 101*  ALT 55*  ALKPHOS 61  BILITOT 0.6  PROT 6.4  ALBUMIN 3.3*   No results found for this basename: LIPASE, AMYLASE,  in the last 168 hours No results found for this basename: AMMONIA,  in the last 168 hours  CBC:  Recent Labs Lab 07/03/14 0057 07/03/14 0845 07/03/14 1545 07/04/14 0020 07/04/14 0808  WBC 5.0 5.0 5.4 4.4 5.1  HGB 12.8* 12.3* 12.8* 12.7* 13.2  HCT 36.4* 34.4* 36.5* 36.0* 36.3*  MCV 101.4* 98.9 100.6* 101.1* 101.1*  PLT 164 163 167 160 156    Cardiac Enzymes:  Recent Labs Lab 07/02/14 0301 07/02/14 0816 07/02/14 1455  TROPONINI <0.30 <0.30 <0.30    Lipid Panel: No results found for this basename: CHOL, TRIG, HDL, CHOLHDL, VLDL, LDLCALC,  in the last 168 hours  CBG:  Recent Labs Lab 07/02/14 0004  GLUCAP 119*    Microbiology: No results found for this or any previous visit.  Coagulation Studies: No results found for this basename: LABPROT, INR,  in the last 72 hours  Imaging: Mr Brain Wo Contrast  07/04/2014   CLINICAL DATA:  Dizziness.  Syncopal episode with trauma to head. Laceration over the left orbit. Orthostatics hypotension.  EXAM: MRI HEAD  WITHOUT CONTRAST  TECHNIQUE: Multiplanar, multiecho pulse sequences of the brain and surrounding structures were obtained without intravenous contrast.  COMPARISON:  CT head without contrast 07/02/2014  FINDINGS: A punctate area of restricted diffusion is noted in the anterior left frontal lobe on image 20 of series 6. Mild periventricular white matter changes are noted bilaterally. There is a remote subcortical white matter infarct of the anterior left frontal lobe measuring 8.5 mm.  No hemorrhage or mass lesion is present. The ventricles are of normal size. No significant extraaxial fluid collection is present.  Flow is present in the major intracranial arteries. The globes and orbits are intact. A polyp or mucous retention cyst is noted posteriorly in the left maxillary sinus. Chronic right maxillary sinus changes are evident. The remaining paranasal sinuses and the mastoid air cells are clear.  IMPRESSION: 1. Punctate nonhemorrhagic infarct involving the anterior left frontal lobe. 2. Remote 8 mm left frontal lobe white matter infarct. 3. Mild periventricular white matter changes bilaterally. 4. 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   Electronically Signed   By: Lawrence Santiago M.D.   On: 07/04/2014 11:12    Carotid doppler: Summary: Findings suggest 1-39% internal carotid artery stenosis bilaterally. Vertebral arteries are patent with antegrade flow.  2 D echo: Study Conclusions  - Left ventricle: The cavity size was normal. Systolic function was normal. Wall motion was normal; there were no regional wall motion abnormalities. There was an increased relative contribution of atrial contraction to ventricular filling.    Assessment and plan discussed with with attending physician and they are in agreement.    Etta Quill PA-C [facility] [phone]  07/04/2014, 1:47 PM   Assessment: 68 y.o. male with incidental tiny left frontal ischemic stroke found on MRI  (no blood products on GRE to think this is post traumatic blood the finding doesn't has characteristic features to suggest contusion).  Patients CVA likely has no correlation with his syncopal episodes. Patient has risk factors of hypercholesterolemia and remote left frontal silent infarct thus would benefit from further stroke work up.   Stroke Risk Factors - hyperlipidemia and smoking  Recommend: 1) CTA head and neck 2) Start ASA 81 mg daily 3) LDL and A1c 4) Smoking Cessation 5) Stroke team to follow in AM.   Patient seem together with physician assistant and I concur with above assessment and plan.  Dorian Pod, MD

## 2014-07-05 DIAGNOSIS — I951 Orthostatic hypotension: Secondary | ICD-10-CM

## 2014-07-05 DIAGNOSIS — S0100XA Unspecified open wound of scalp, initial encounter: Secondary | ICD-10-CM

## 2014-07-05 DIAGNOSIS — F1010 Alcohol abuse, uncomplicated: Secondary | ICD-10-CM

## 2014-07-05 DIAGNOSIS — I6350 Cerebral infarction due to unspecified occlusion or stenosis of unspecified cerebral artery: Secondary | ICD-10-CM | POA: Diagnosis present

## 2014-07-05 DIAGNOSIS — Z5189 Encounter for other specified aftercare: Secondary | ICD-10-CM

## 2014-07-05 DIAGNOSIS — S0180XA Unspecified open wound of other part of head, initial encounter: Secondary | ICD-10-CM

## 2014-07-05 LAB — CBC
HEMATOCRIT: 36.2 % — AB (ref 39.0–52.0)
HEMATOCRIT: 36.6 % — AB (ref 39.0–52.0)
HEMOGLOBIN: 13.1 g/dL (ref 13.0–17.0)
Hemoglobin: 13.2 g/dL (ref 13.0–17.0)
MCH: 35.8 pg — ABNORMAL HIGH (ref 26.0–34.0)
MCH: 36.6 pg — AB (ref 26.0–34.0)
MCHC: 36.2 g/dL — AB (ref 30.0–36.0)
MCHC: 36.1 g/dL — AB (ref 30.0–36.0)
MCV: 99.2 fL (ref 78.0–100.0)
MCV: 101.1 fL — AB (ref 78.0–100.0)
PLATELETS: 161 10*3/uL (ref 150–400)
Platelets: 172 10*3/uL (ref 150–400)
RBC: 3.69 MIL/uL — ABNORMAL LOW (ref 4.22–5.81)
RBC: 3.58 MIL/uL — AB (ref 4.22–5.81)
RDW: 13.8 % (ref 11.5–15.5)
RDW: 13.7 % (ref 11.5–15.5)
WBC: 4.6 10*3/uL (ref 4.0–10.5)
WBC: 4.9 10*3/uL (ref 4.0–10.5)

## 2014-07-05 LAB — BASIC METABOLIC PANEL
ANION GAP: 17 — AB (ref 5–15)
BUN: 7 mg/dL (ref 6–23)
CO2: 20 meq/L (ref 19–32)
CREATININE: 0.55 mg/dL (ref 0.50–1.35)
Calcium: 8.6 mg/dL (ref 8.4–10.5)
Chloride: 99 mEq/L (ref 96–112)
GFR calc non Af Amer: >90 mL/min (ref >90)
Glucose, Bld: 118 mg/dL — ABNORMAL HIGH (ref 70–99)
Potassium: 3.6 mEq/L — ABNORMAL LOW (ref 3.7–5.3)
SODIUM: 136 meq/L — AB (ref 137–147)

## 2014-07-05 LAB — LIPID PANEL
CHOLESTEROL: 118 mg/dL (ref 0–200)
HDL: 57 mg/dL (ref >39)
LDL Cholesterol: 46 mg/dL (ref 0–99)
TRIGLYCERIDES: 77 mg/dL (ref <150)
Total CHOL/HDL Ratio: 2.1 RATIO
VLDL: 15 mg/dL (ref 0–40)

## 2014-07-05 LAB — HEMOGLOBIN A1C
Hgb A1c MFr Bld: 5.6 % (ref <5.7)
Mean Plasma Glucose: 114 mg/dL (ref <117)

## 2014-07-05 MED ORDER — MECLIZINE HCL 25 MG PO TABS
25.0000 mg | ORAL_TABLET | Freq: Three times a day (TID) | ORAL | Status: DC
  Administered 2014-07-05 – 2014-07-06 (×6): 25 mg via ORAL
  Filled 2014-07-05 (×6): qty 1

## 2014-07-05 MED ORDER — LORATADINE 10 MG PO TABS
10.0000 mg | ORAL_TABLET | Freq: Every day | ORAL | Status: DC
  Administered 2014-07-05 – 2014-07-07 (×3): 10 mg via ORAL
  Filled 2014-07-05 (×3): qty 1

## 2014-07-05 MED ORDER — LORAZEPAM 1 MG PO TABS
1.0000 mg | ORAL_TABLET | Freq: Three times a day (TID) | ORAL | Status: DC | PRN
  Administered 2014-07-05 – 2014-07-07 (×5): 1 mg via ORAL
  Filled 2014-07-05 (×6): qty 1

## 2014-07-05 MED ORDER — LORAZEPAM 2 MG/ML IJ SOLN
1.0000 mg | Freq: Once | INTRAMUSCULAR | Status: AC
  Administered 2014-07-05: 1 mg via INTRAVENOUS
  Filled 2014-07-05: qty 1

## 2014-07-05 MED ORDER — ATORVASTATIN CALCIUM 10 MG PO TABS
10.0000 mg | ORAL_TABLET | Freq: Every day | ORAL | Status: DC
  Administered 2014-07-05 – 2014-07-06 (×2): 10 mg via ORAL
  Filled 2014-07-05 (×3): qty 1

## 2014-07-05 NOTE — Progress Notes (Signed)
Physical Therapy Treatment Patient Details Name: Joshua Henson MRN: [id number] DOB: Oct 09, 1946 Today's Date: 07/05/2014    History of Present Illness 68 y.o. male with remote history of Prostate Cancer S/P Radiation Rx who presents to the ED after he got up to night to go into his kitchen and he passed out and hit his head on the kitchen counter and suffered a laceration to his left eyebrow area.  When he arrived at the ED, he was evaluated and found to have orthostatic hypotension with systolic blood pressures of that dropped from 140/60 to 60/40.  Pt with positive left frontal CVA.  Also positive for right BPPV.  Suspect hypofunction as well and will assess when BPPV is treated completely.      PT Comments    Patient continues to have significant symptoms with Rt posterior canal BPPV impacting mobility.  Gait remains very unsteady, with loss of balance requiring assist to prevent falls.  Agree with need for Inpatient Rehab (CIR) stay to maximize functional independence and safety prior to discharge home with significant other.  Follow Up Recommendations  CIR     Equipment Recommendations  Other (comment) (TBD)    Recommendations for Other Services Rehab consult     Precautions / Restrictions Precautions Precautions: Fall Restrictions Weight Bearing Restrictions: No    Mobility  Bed Mobility Overal bed mobility: Modified Independent             General bed mobility comments: Moves to sitting with use of hand rail and increased time.  Transfers Overall transfer level: Needs assistance Equipment used: Rolling walker (2 wheeled) Transfers: Sit to/from Stand Sit to Stand: Mod assist         General transfer comment: Verbal cues for hand placement - continues to reach for RW to pull to standing.  Assist to rise to standing and to maintain balance/safety.  Ambulation/Gait Ambulation/Gait assistance: Mod assist Ambulation Distance (Feet): 62 Feet Assistive device:  Rolling walker (2 wheeled) Gait Pattern/deviations: Step-through pattern;Decreased stride length;Ataxic;Staggering left;Staggering right;Drifts right/left;Trunk flexed;Wide base of support Gait velocity: Decreased Gait velocity interpretation: Below normal speed for age/gender General Gait Details: Verbal cues for safe use of RW.  Physical assist to maneuver RW at times to keep it close to patient.  Patient with very unsteady gait, with loss of balance x 4, requiring mod assist to prevent fall.  Worked on focusing on target during gait to help decrease dizziness, especially during turns.   Stairs            Wheelchair Mobility    Modified Rankin (Stroke Patients Only) Modified Rankin (Stroke Patients Only) Pre-Morbid Rankin Score: No symptoms Modified Rankin: Moderately severe disability     Balance Overall balance assessment: Needs assistance Sitting-balance support: Single extremity supported;Feet supported Sitting balance-Leahy Scale: Poor     Standing balance support: Bilateral upper extremity supported Standing balance-Leahy Scale: Poor Standing balance comment: Required use of BUE's on RW to maintain balance.                    Cognition Arousal/Alertness: Awake/alert Behavior During Therapy: Impulsive Overall Cognitive Status: Impaired/Different from baseline Area of Impairment: Following commands;Safety/judgement       Following Commands: Follows multi-step commands with increased time Safety/Judgement: Decreased awareness of safety     General Comments: pt very anxious and impulsive; per significant other seems to be pt baseline     Vestibular  Assessment:  Performed Rt [employer].  Patient positive for Rt posterior canal  BPPV, with strong nystagmus and 10/10 dizziness x 20 seconds. Treatment: Treated with Epley maneuver.  Patient with symptoms at each phase of maneuver, with nausea, dizziness, and nystagmus.    General Comments         Pertinent Vitals/Pain     Home Living                      Prior Function            PT Goals (current goals can now be found in the care plan section) Acute Rehab PT Goals Patient Stated Goal: To stop the dizziness Progress towards PT goals: Progressing toward goals    Frequency  Min 3X/week    PT Plan Current plan remains appropriate    Co-evaluation             End of Session Equipment Utilized During Treatment: Gait belt Activity Tolerance: Patient limited by fatigue (Limited by BPPV symptoms of dizziness and nausea) Patient left: in chair;with call bell/phone within reach;with family/visitor present     Time: 1343-1414 PT Time Calculation (min): 31 min  Charges:  $Gait Training: 8-22 mins $Canalith Rep Proc: 8-22 mins                    G Codes:      Despina Pole 2014/08/02, 3:04 PM

## 2014-07-05 NOTE — Progress Notes (Signed)
Patient ID: Ulmer Degen [facility]  male  CWC:[id number]    DOB: January 16, 1946    DOA: 07/01/2014  PCP: Adella Hare, MD  Brief Narrative:  68 year old with history of prostate cancer status post radiation who presented to the ED after a syncopal episode of which he hit his head on the kitchen counter and suffered laceration to his left eyebrow. Reportedly patient had been drinking heavily prior to admission and on further evaluation was found to have orthostatic blood pressure changes. MRI evaluation of the head revealed new stroke of which cardiology has been consulted.   Assessment/Plan:   Principal Problem:  Syncope and collapse : Multifactorial, Possibly due to dehydration, heavy alcohol abuse leading to gait instability, acute vertigo (right BPPV), CVA on MRI - Continue gentle hydration - Obtain PT evaluation, confusing, recommended CIR versus outpatient PT versus 24-hour supervision. Requested PT to see him again and evaluate for final disposition  BPPV, right: With chronic sinusitis - Placed on meclizine schedule for 2 days, will have physical therapy see him again - Placed on Claritin daily  Acute left frontal lobe punctate infarct/CVA with no localizing or lateralizing symptoms - Neurology was consulted, MRI positive for acute left frontal lobe punctate infarct  -CT angiogram of the head and neck normal  - 2-D echo showed normal EF with no wall motion abnormalities. Carotid Dopplers showed no critical ICA stenosis  - Continue aspirin, lipid panel showed LDL of 46, cholesterol 118  Scalp laceration  - CT head  showed no fracture    heavy Alcohol abuse  - on CIWA protocol, still somewhat tremulous  - Asking for Ativan, explained to the patient that he cannot be on scheduled Ativan at home. He may benefit from Klonopin for a couple of days and would need to see a psychiatrist or his own primary care physician if he needs any refills.   DVT Prophylaxis:  Code Status:  Family  Communication:  Disposition: hopefully tomorrow   Consultants:  Neurology   Procedures:  MRI of the brain   Antibiotics:  None     Subjective: Patient seen and examined, still somewhat anxious and tremulous, still feels a somewhat dizzy   Objective: Weight change:   Intake/Output Summary (Last 24 hours) at 07/05/14 1123 Last data filed at 07/05/14 0953  Gross per 24 hour  Intake   7780 ml  Output   1575 ml  Net   6205 ml   Blood pressure 150/100, pulse 83, temperature 98.3 F (36.8 C), temperature source Oral, resp. rate 20, height 5' 10.5" (1.791 m), weight 92.398 kg (203 lb 11.2 oz), SpO2 96.00%.  Physical Exam: General: Alert and awake, oriented x3, not in any acute distress. CVS: S1-S2 clear, no murmur rubs or gallops Chest: clear to auscultation bilaterally, no wheezing, rales or rhonchi Abdomen: soft nontender, nondistended, normal bowel sounds  Extremities: no cyanosis, clubbing or edema noted bilaterally, still somewhat tremulous  Neuro: Cranial nerves II-XII intact, no focal neurological deficits  Lab Results: Basic Metabolic Panel:  Recent Labs Lab 07/04/14 0020 07/04/14 2358  NA 138 136*  K 3.3* 3.6*  CL 98 99  CO2 22 20  GLUCOSE 100* 118*  BUN 5* 7  CREATININE 0.51 0.55  CALCIUM 8.7 8.6   Liver Function Tests:  Recent Labs Lab 07/02/14 0301 07/04/14 1610  AST 101* 115*  ALT 55* 62*  ALKPHOS 61 69  BILITOT 0.6 1.0  PROT 6.4 6.6  ALBUMIN 3.3* 3.3*   No results found for this  basename: LIPASE, AMYLASE,  in the last 168 hours No results found for this basename: AMMONIA,  in the last 168 hours CBC:  Recent Labs Lab 07/04/14 2358 07/05/14 0803  WBC 4.6 4.9  HGB 13.1 13.2  HCT 36.2* 36.6*  MCV 101.1* 99.2  PLT 161 172   Cardiac Enzymes:  Recent Labs Lab 07/02/14 0301 07/02/14 0816 07/02/14 1455  TROPONINI <0.30 <0.30 <0.30   BNP: No components found with this basename: POCBNP,  CBG:  Recent Labs Lab 07/02/14 0004   GLUCAP 119*     Micro Results: No results found for this or any previous visit (from the past 240 hour(s)).  Studies/Results: Ct Angio Head W/cm &/or Wo Cm  07/04/2014   CLINICAL DATA:  Left frontal stroke. Recent fall with head trauma. Weakness.  EXAM: CT ANGIOGRAPHY HEAD AND NECK  TECHNIQUE: Multidetector CT imaging of the head and neck was performed using the standard protocol during bolus administration of intravenous contrast. Multiplanar CT image reconstructions and MIPs were obtained to evaluate the vascular anatomy. Carotid stenosis measurements (when applicable) are obtained utilizing NASCET criteria, using the distal internal carotid diameter as the denominator.  CONTRAST:  29mL OMNIPAQUE IOHEXOL 350 MG/ML SOLN  COMPARISON:  MRI same day.  Head CT 07/02/2014  FINDINGS: CTA NECK FINDINGS  Lung apices show mild scarring. Degenerated articulation of the sternum and left rib indents the lung and as associated with some pulmonary scarring.  There is mild atherosclerosis of the aorta. Branching pattern of the brachiocephalic vessels from the arch is normal. No origin stenoses.  The right common carotid artery is widely patent to the bifurcation. There is mild atherosclerotic calcification at the carotid bifurcation. Minimal diameter of the proximal ICA is 5 mm, the same as the more distal cervical ICA. Therefore, there is no stenosis.  The left common carotid artery is widely patent to the bifurcation. There is calcified plaque of the distal common carotid artery in the proximal internal carotid artery. Minimal diameter of the proximal ICA is 5 mm. Compared to a more distal cervical ICA diameter of 5 mm, there is no stenosis.  Both vertebral artery origins are widely patent. Both vertebral arteries appear widely patent through the cervical region.  No soft tissue lesion of the neck is evident. There is ordinary degenerative change of the cervical spine.  Review of the MIP images confirms the above  findings.  CTA HEAD FINDINGS  Both internal carotid arteries are patent through the siphon regions. There is some wall calcification but there is no stenosis. The anterior and middle cerebral vessels are patent bilaterally without proximal stenosis, aneurysm or vascular malformation.  Both vertebral arteries are patent to the basilar. No basilar stenosis. Posterior circulation branch vessels are normal.  Intracranial venous structures are patent and normal.  Chronic small vessel ischemic changes remain evident within the hemispheric white matter. No acute abnormality identifiable by CT.  Review of the MIP images confirms the above findings.  IMPRESSION: Ordinary atherosclerotic calcification at both carotid bifurcations but no stenosis.   Electronically Signed   By: Nelson Chimes M.D.   On: 07/04/2014 17:12   Dg Chest 2 View  07/01/2014   CLINICAL DATA:  Syncopal F cell falling for do not image chest  EXAM: CHEST  2 VIEW  COMPARISON:  None.  FINDINGS: The right hemidiaphragm is higher than the left. There is atelectasis anteriorly and posteriorly at the left lung base. There is no pneumothorax or pleural effusion. The right lung is clear.  The cardiac silhouette is top-normal in size. The pulmonary vascularity is normal. There is mild tortuosity of the descending thoracic aorta. The observed portions of the ribs exhibit no acute abnormalities. The thoracic vertebral bodies are preserved in height.  IMPRESSION: There is atelectasis at the left lung base. There is no pneumothorax nor acute rib fracture demonstrated.   Electronically Signed   By: David  Martinique   On: 07/01/2014 15:21   Ct Head Wo Contrast  07/02/2014   CLINICAL DATA:  Syncope and head injury  EXAM: CT HEAD WITHOUT CONTRAST  TECHNIQUE: Contiguous axial images were obtained from the base of the skull through the vertex without intravenous contrast.  COMPARISON:  01/07/2014 temporal bone CT  FINDINGS: Skull and Sinuses:There is a large left  frontotemporal scalp laceration and contusion. No underlying fracture.  Probable mucous retention cyst in the posterior left maxillary sinus. An infraorbital air cell on the left may limit left maxillary sinus outflow. Chronic mucosal thickening and neoosteogenesis of the right maxillary sinus. These changes of chronic sinusitis are stable from previous.  Orbits: No acute abnormality.  Brain: No evidence of acute abnormality, such as acute infarction, hemorrhage, hydrocephalus, or mass lesion/mass effect. Generalized cerebral and cerebellar volume loss which is borderline age advanced. There is mild chronic small vessel disease with patchy deep and periventricular cerebral white matter low density.  IMPRESSION: 1. No acute intracranial injury. 2. Left frontotemporal scalp contusion.  No calvarial fracture. 3. Chronic findings are noted above.   Electronically Signed   By: Jorje Guild M.D.   On: 07/02/2014 01:39   Ct Angio Neck W/cm &/or Wo/cm  07/04/2014   CLINICAL DATA:  Left frontal stroke. Recent fall with head trauma. Weakness.  EXAM: CT ANGIOGRAPHY HEAD AND NECK  TECHNIQUE: Multidetector CT imaging of the head and neck was performed using the standard protocol during bolus administration of intravenous contrast. Multiplanar CT image reconstructions and MIPs were obtained to evaluate the vascular anatomy. Carotid stenosis measurements (when applicable) are obtained utilizing NASCET criteria, using the distal internal carotid diameter as the denominator.  CONTRAST:  52mL OMNIPAQUE IOHEXOL 350 MG/ML SOLN  COMPARISON:  MRI same day.  Head CT 07/02/2014  FINDINGS: CTA NECK FINDINGS  Lung apices show mild scarring. Degenerated articulation of the sternum and left rib indents the lung and as associated with some pulmonary scarring.  There is mild atherosclerosis of the aorta. Branching pattern of the brachiocephalic vessels from the arch is normal. No origin stenoses.  The right common carotid artery is widely  patent to the bifurcation. There is mild atherosclerotic calcification at the carotid bifurcation. Minimal diameter of the proximal ICA is 5 mm, the same as the more distal cervical ICA. Therefore, there is no stenosis.  The left common carotid artery is widely patent to the bifurcation. There is calcified plaque of the distal common carotid artery in the proximal internal carotid artery. Minimal diameter of the proximal ICA is 5 mm. Compared to a more distal cervical ICA diameter of 5 mm, there is no stenosis.  Both vertebral artery origins are widely patent. Both vertebral arteries appear widely patent through the cervical region.  No soft tissue lesion of the neck is evident. There is ordinary degenerative change of the cervical spine.  Review of the MIP images confirms the above findings.  CTA HEAD FINDINGS  Both internal carotid arteries are patent through the siphon regions. There is some wall calcification but there is no stenosis. The anterior and middle cerebral vessels  are patent bilaterally without proximal stenosis, aneurysm or vascular malformation.  Both vertebral arteries are patent to the basilar. No basilar stenosis. Posterior circulation branch vessels are normal.  Intracranial venous structures are patent and normal.  Chronic small vessel ischemic changes remain evident within the hemispheric white matter. No acute abnormality identifiable by CT.  Review of the MIP images confirms the above findings.  IMPRESSION: Ordinary atherosclerotic calcification at both carotid bifurcations but no stenosis.   Electronically Signed   By: Nelson Chimes M.D.   On: 07/04/2014 17:12   Mr Brain Wo Contrast  07/04/2014   CLINICAL DATA:  Dizziness. Syncopal episode with trauma to head. Laceration over the left orbit. Orthostatics hypotension.  EXAM: MRI HEAD WITHOUT CONTRAST  TECHNIQUE: Multiplanar, multiecho pulse sequences of the brain and surrounding structures were obtained without intravenous contrast.   COMPARISON:  CT head without contrast 07/02/2014  FINDINGS: A punctate area of restricted diffusion is noted in the anterior left frontal lobe on image 20 of series 6. Mild periventricular white matter changes are noted bilaterally. There is a remote subcortical white matter infarct of the anterior left frontal lobe measuring 8.5 mm.  No hemorrhage or mass lesion is present. The ventricles are of normal size. No significant extraaxial fluid collection is present.  Flow is present in the major intracranial arteries. The globes and orbits are intact. A polyp or mucous retention cyst is noted posteriorly in the left maxillary sinus. Chronic right maxillary sinus changes are evident. The remaining paranasal sinuses and the mastoid air cells are clear.  IMPRESSION: 1. Punctate nonhemorrhagic infarct involving the anterior left frontal lobe. 2. Remote 8 mm left frontal lobe white matter infarct. 3. Mild periventricular white matter changes bilaterally. 4. 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   Electronically Signed   By: Lawrence Santiago M.D.   On: 07/04/2014 11:12    Medications: Scheduled Meds: . aspirin EC  81 mg Oral Daily  . enoxaparin (LOVENOX) injection  40 mg Subcutaneous Q24H  . feeding supplement (ENSURE)  1 Container Oral BID BM  . folic acid  1 mg Oral Daily  . LORazepam  0-4 mg Intravenous Q12H  . meclizine  25 mg Oral TID  . multivitamin with minerals  1 tablet Oral Daily  . thiamine  100 mg Oral Daily      LOS: 4 days   Lillith Mcneff M.D. [facility] 07/05/2014, 11:23 AM Pager: [phone]  If 7PM-7AM, please contact night-coverage [email or web address] Password [email or web address]  **Disclaimer: This note was dictated with voice recognition software. Similar sounding words can inadvertently be transcribed and this note may contain transcription errors which may not have been corrected upon publication of note.**

## 2014-07-05 NOTE — Progress Notes (Addendum)
Stroke Team Progress Note  HISTORY Joshua Henson is an 68 y.o. male with two episodes of syncope over the last 5 days. Patient was admitted to hospital for evaluation of syncope. During work up MRI of brain revealed a punctate nonhemorrhagic infarct involving the anterior left frontal lobe and a remote 8 mm left frontal lobe white matter infarct. Pateint has had no localizing or lateralizing symptoms while in hospital and does not recall any symptoms in the past. Neurology was asked to evaluate patient for stroke.   Patient was not administerd TPA secondary to unknown last known normal. He was admitted to [facility] for further evaluation and treatment.  SUBJECTIVE Resting comfortably. Recent CTA of head and neck were unremarkable.   OBJECTIVE Most recent Vital Signs: Filed Vitals:   07/04/14 2100 07/05/14 0050 07/05/14 0446 07/05/14 0732  BP:  150/96 157/99 150/100  Pulse:  77 72 83  Temp: 98.3 F (36.8 C) 98.7 F (37.1 C) 98.6 F (37 C) 98.3 F (36.8 C)  TempSrc: Oral Oral Oral Oral  Resp: 18 18 18 20   Height:      Weight:   203 lb 11.2 oz (92.398 kg)   SpO2: 97% 98% 99% 96%   CBG (last 3)  No results found for this basename: GLUCAP,  in the last 72 hours  IV Fluid Intake:   . sodium chloride 1,000 mL (07/05/14 0744)    MEDICATIONS  . aspirin EC  81 mg Oral Daily  . enoxaparin (LOVENOX) injection  40 mg Subcutaneous Q24H  . feeding supplement (ENSURE)  1 Container Oral BID BM  . folic acid  1 mg Oral Daily  . LORazepam  0-4 mg Intravenous Q12H  . meclizine  25 mg Oral TID  . multivitamin with minerals  1 tablet Oral Daily  . thiamine  100 mg Oral Daily   PRN:  acetaminophen, acetaminophen, alum & mag hydroxide-simeth, ondansetron (ZOFRAN) IV, ondansetron, oxyCODONE  Diet:  Cardiac Activity:   Up with assistance DVT Prophylaxis:  lovenox  CLINICALLY SIGNIFICANT STUDIES Basic Metabolic Panel:  Recent Labs Lab 07/04/14 0020 07/04/14 2358  NA 138 136*  K 3.3* 3.6*  CL  98 99  CO2 22 20  GLUCOSE 100* 118*  BUN 5* 7  CREATININE 0.51 0.55  CALCIUM 8.7 8.6   Liver Function Tests:  Recent Labs Lab 07/02/14 0301 07/04/14 1610  AST 101* 115*  ALT 55* 62*  ALKPHOS 61 69  BILITOT 0.6 1.0  PROT 6.4 6.6  ALBUMIN 3.3* 3.3*   CBC:  Recent Labs Lab 07/04/14 2358 07/05/14 0803  WBC 4.6 4.9  HGB 13.1 13.2  HCT 36.2* 36.6*  MCV 101.1* 99.2  PLT 161 172   Coagulation: No results found for this basename: LABPROT, INR,  in the last 168 hours Cardiac Enzymes:  Recent Labs Lab 07/02/14 0301 07/02/14 0816 07/02/14 1455  TROPONINI <0.30 <0.30 <0.30   Urinalysis:  Recent Labs Lab 07/02/14 0212  COLORURINE YELLOW  LABSPEC 1.016  PHURINE 7.0  GLUCOSEU NEGATIVE  HGBUR TRACE*  BILIRUBINUR NEGATIVE  KETONESUR NEGATIVE  PROTEINUR NEGATIVE  UROBILINOGEN 1.0  NITRITE NEGATIVE  LEUKOCYTESUR NEGATIVE   Lipid Panel    Component Value Date/Time   CHOL 118 07/04/2014 2358   TRIG 77 07/04/2014 2358   HDL 57 07/04/2014 2358   CHOLHDL 2.1 07/04/2014 2358   VLDL 15 07/04/2014 2358   LDLCALC 46 07/04/2014 2358   HgbA1C  Lab Results  Component Value Date   HGBA1C 5.6 07/04/2014  Urine Drug Screen:   No results found for this basename: labopia, cocainscrnur, labbenz, amphetmu, thcu, labbarb    Alcohol Level:  Recent Labs Lab 07/02/14 0301  ETH 155*    Ct Angio Head W/cm &/or Wo Cm  07/04/2014   CLINICAL DATA:  Left frontal stroke. Recent fall with head trauma. Weakness.  EXAM: CT ANGIOGRAPHY HEAD AND NECK  TECHNIQUE: Multidetector CT imaging of the head and neck was performed using the standard protocol during bolus administration of intravenous contrast. Multiplanar CT image reconstructions and MIPs were obtained to evaluate the vascular anatomy. Carotid stenosis measurements (when applicable) are obtained utilizing NASCET criteria, using the distal internal carotid diameter as the denominator.  CONTRAST:  18mL OMNIPAQUE IOHEXOL 350 MG/ML SOLN   COMPARISON:  MRI same day.  Head CT 07/02/2014  FINDINGS: CTA NECK FINDINGS  Lung apices show mild scarring. Degenerated articulation of the sternum and left rib indents the lung and as associated with some pulmonary scarring.  There is mild atherosclerosis of the aorta. Branching pattern of the brachiocephalic vessels from the arch is normal. No origin stenoses.  The right common carotid artery is widely patent to the bifurcation. There is mild atherosclerotic calcification at the carotid bifurcation. Minimal diameter of the proximal ICA is 5 mm, the same as the more distal cervical ICA. Therefore, there is no stenosis.  The left common carotid artery is widely patent to the bifurcation. There is calcified plaque of the distal common carotid artery in the proximal internal carotid artery. Minimal diameter of the proximal ICA is 5 mm. Compared to a more distal cervical ICA diameter of 5 mm, there is no stenosis.  Both vertebral artery origins are widely patent. Both vertebral arteries appear widely patent through the cervical region.  No soft tissue lesion of the neck is evident. There is ordinary degenerative change of the cervical spine.  Review of the MIP images confirms the above findings.  CTA HEAD FINDINGS  Both internal carotid arteries are patent through the siphon regions. There is some wall calcification but there is no stenosis. The anterior and middle cerebral vessels are patent bilaterally without proximal stenosis, aneurysm or vascular malformation.  Both vertebral arteries are patent to the basilar. No basilar stenosis. Posterior circulation branch vessels are normal.  Intracranial venous structures are patent and normal.  Chronic small vessel ischemic changes remain evident within the hemispheric white matter. No acute abnormality identifiable by CT.  Review of the MIP images confirms the above findings.  IMPRESSION: Ordinary atherosclerotic calcification at both carotid bifurcations but no stenosis.    Electronically Signed   By: Nelson Chimes M.D.   On: 07/04/2014 17:12   Ct Angio Neck W/cm &/or Wo/cm  07/04/2014   CLINICAL DATA:  Left frontal stroke. Recent fall with head trauma. Weakness.  EXAM: CT ANGIOGRAPHY HEAD AND NECK  TECHNIQUE: Multidetector CT imaging of the head and neck was performed using the standard protocol during bolus administration of intravenous contrast. Multiplanar CT image reconstructions and MIPs were obtained to evaluate the vascular anatomy. Carotid stenosis measurements (when applicable) are obtained utilizing NASCET criteria, using the distal internal carotid diameter as the denominator.  CONTRAST:  48mL OMNIPAQUE IOHEXOL 350 MG/ML SOLN  COMPARISON:  MRI same day.  Head CT 07/02/2014  FINDINGS: CTA NECK FINDINGS  Lung apices show mild scarring. Degenerated articulation of the sternum and left rib indents the lung and as associated with some pulmonary scarring.  There is mild atherosclerosis of the aorta. Branching pattern of  the brachiocephalic vessels from the arch is normal. No origin stenoses.  The right common carotid artery is widely patent to the bifurcation. There is mild atherosclerotic calcification at the carotid bifurcation. Minimal diameter of the proximal ICA is 5 mm, the same as the more distal cervical ICA. Therefore, there is no stenosis.  The left common carotid artery is widely patent to the bifurcation. There is calcified plaque of the distal common carotid artery in the proximal internal carotid artery. Minimal diameter of the proximal ICA is 5 mm. Compared to a more distal cervical ICA diameter of 5 mm, there is no stenosis.  Both vertebral artery origins are widely patent. Both vertebral arteries appear widely patent through the cervical region.  No soft tissue lesion of the neck is evident. There is ordinary degenerative change of the cervical spine.  Review of the MIP images confirms the above findings.  CTA HEAD FINDINGS  Both internal carotid arteries are  patent through the siphon regions. There is some wall calcification but there is no stenosis. The anterior and middle cerebral vessels are patent bilaterally without proximal stenosis, aneurysm or vascular malformation.  Both vertebral arteries are patent to the basilar. No basilar stenosis. Posterior circulation branch vessels are normal.  Intracranial venous structures are patent and normal.  Chronic small vessel ischemic changes remain evident within the hemispheric white matter. No acute abnormality identifiable by CT.  Review of the MIP images confirms the above findings.  IMPRESSION: Ordinary atherosclerotic calcification at both carotid bifurcations but no stenosis.   Electronically Signed   By: Nelson Chimes M.D.   On: 07/04/2014 17:12   Mr Brain Wo Contrast  07/04/2014   CLINICAL DATA:  Dizziness. Syncopal episode with trauma to head. Laceration over the left orbit. Orthostatics hypotension.  EXAM: MRI HEAD WITHOUT CONTRAST  TECHNIQUE: Multiplanar, multiecho pulse sequences of the brain and surrounding structures were obtained without intravenous contrast.  COMPARISON:  CT head without contrast 07/02/2014  FINDINGS: A punctate area of restricted diffusion is noted in the anterior left frontal lobe on image 20 of series 6. Mild periventricular white matter changes are noted bilaterally. There is a remote subcortical white matter infarct of the anterior left frontal lobe measuring 8.5 mm.  No hemorrhage or mass lesion is present. The ventricles are of normal size. No significant extraaxial fluid collection is present.  Flow is present in the major intracranial arteries. The globes and orbits are intact. A polyp or mucous retention cyst is noted posteriorly in the left maxillary sinus. Chronic right maxillary sinus changes are evident. The remaining paranasal sinuses and the mastoid air cells are clear.  IMPRESSION: 1. Punctate nonhemorrhagic infarct involving the anterior left frontal lobe. 2. Remote 8 mm  left frontal lobe white matter infarct. 3. Mild periventricular white matter changes bilaterally. 4. 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   Electronically Signed   By: Lawrence Santiago M.D.   On: 07/04/2014 11:12    CT of the brain    MRI of the brain    MRA of the brain    2D Echocardiogram    Carotid Doppler    CXR    Therapy Recommendations pending evaluation for CIR, being treated for BPPV  Physical Exam   General: NAD  Mental Status:  Alert, oriented, thought content appropriate. Speech fluent without evidence of aphasia. Able to follow 3 step commands without difficulty.  Cranial Nerves:  II: Discs flat bilaterally; Visual fields grossly normal, pupils equal, round, reactive  to light and accommodation  III,IV, VI: ptosis not present, extra-ocular motions intact bilaterally  V,VII: smile symmetric, facial light touch sensation normal bilaterally  VIII: hearing normal bilaterally  IX,X: gag reflex present  XI: bilateral shoulder shrug  XII: midline tongue extension without atrophy or fasciculations  Motor:  Right : Upper extremity 5/5 Left: Upper extremity 5/5  Lower extremity 5/5 Lower extremity 5/5  Tone and bulk:normal tone throughout; no atrophy noted  Sensory: Pinprick and light touch intact throughout, bilaterally  Deep Tendon Reflexes:  Right: Upper Extremity Left: Upper extremity  biceps (C-5 to C-6) 2/4 biceps (C-5 to C-6) 2/4  tricep (C7) 2/4 triceps (C7) 2/4  Brachioradialis (C6) 2/4 Brachioradialis (C6) 2/4  Lower Extremity Lower Extremity  quadriceps (L-2 to L-4) 2/4 quadriceps (L-2 to L-4) 2/4  Achilles (S1) 1/4 Achilles (S1) 1/4  Plantars:  Right: downgoing Left: downgoing  Cerebellar:  normal finger-to-nose, normal heel-to-shin test    ASSESSMENT Mr. Joshua Henson is a 68 y.o. male presenting with syncopal episode. No IV t-PA as no known last normal. Imaging confirms a likely incidental finding of a tiny left frontal ischemic  infarct and a remote silent left frontal infarct. 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  On no antiplatelet prior to admission. Now on ASA 81mg  daily for secondary stroke prevention. Patient asymptomatic from a neurological standpoint . Stroke work up underway.   CVA  LDL 46  EtOH abuse  2D echo wnl  Carotid ultrasound 1-39% bilaterally  Unremarkable CTA head and neck   Hospital day # 4  TREATMENT/PLAN  Continue ASA 81mg  daily  CIWA protocol  Rehab follow up, pending evaluation for CIR  Risk factor modification  Start home lipitor 10mg   No further stroke workup at this time. Follow up with Dr Rosalin Hawking at [facility] in 2 months    Jim Like, DO [facility] Pager: [phone]   To contact Stroke Continuity provider, please refer to [email or web address] After hours, contact General Neurology

## 2014-07-05 NOTE — Progress Notes (Signed)
0600 CIWA was 10 d/t pt visibly sweating, mild tremors, pt reporting moderate anxiety, and pt's inability to stay still.  Pt has tried multiple times to get out bed.  PRN Ativan dose expired and pt now only getting scheduled Ativan every 12 hours.  MD on call, Dr. Rogue Bussing, notified and a one time dose of Ativan 1 mg ordered.  Will continue to monitor.

## 2014-07-06 DIAGNOSIS — H9190 Unspecified hearing loss, unspecified ear: Secondary | ICD-10-CM

## 2014-07-06 LAB — COMPREHENSIVE METABOLIC PANEL
ALT: 66 U/L — ABNORMAL HIGH (ref 0–53)
AST: 89 U/L — AB (ref 0–37)
Albumin: 3.1 g/dL — ABNORMAL LOW (ref 3.5–5.2)
Alkaline Phosphatase: 75 U/L (ref 39–117)
Anion gap: 16 — ABNORMAL HIGH (ref 5–15)
BILIRUBIN TOTAL: 0.7 mg/dL (ref 0.3–1.2)
BUN: 10 mg/dL (ref 6–23)
CO2: 23 meq/L (ref 19–32)
CREATININE: 0.55 mg/dL (ref 0.50–1.35)
Calcium: 8.6 mg/dL (ref 8.4–10.5)
Chloride: 103 mEq/L (ref 96–112)
GFR calc non Af Amer: >90 mL/min (ref >90)
Glucose, Bld: 131 mg/dL — ABNORMAL HIGH (ref 70–99)
Potassium: 3.3 mEq/L — ABNORMAL LOW (ref 3.7–5.3)
Sodium: 142 mEq/L (ref 137–147)
Total Protein: 6.3 g/dL (ref 6.0–8.3)

## 2014-07-06 LAB — CBC
HCT: 36.2 % — ABNORMAL LOW (ref 39.0–52.0)
Hemoglobin: 12.8 g/dL — ABNORMAL LOW (ref 13.0–17.0)
MCH: 35.2 pg — ABNORMAL HIGH (ref 26.0–34.0)
MCHC: 35.4 g/dL (ref 30.0–36.0)
MCV: 99.5 fL (ref 78.0–100.0)
PLATELETS: 170 10*3/uL (ref 150–400)
RBC: 3.64 MIL/uL — ABNORMAL LOW (ref 4.22–5.81)
RDW: 13.9 % (ref 11.5–15.5)
WBC: 4.2 10*3/uL (ref 4.0–10.5)

## 2014-07-06 LAB — MAGNESIUM: Magnesium: 1.5 mg/dL (ref 1.5–2.5)

## 2014-07-06 MED ORDER — POTASSIUM CHLORIDE 10 MEQ/100ML IV SOLN
10.0000 meq | INTRAVENOUS | Status: DC
  Administered 2014-07-06: 10 meq via INTRAVENOUS
  Filled 2014-07-06 (×2): qty 100

## 2014-07-06 MED ORDER — POTASSIUM CHLORIDE 10 MEQ/100ML IV SOLN
10.0000 meq | INTRAVENOUS | Status: AC
  Administered 2014-07-06: 10 meq via INTRAVENOUS
  Filled 2014-07-06: qty 100

## 2014-07-06 MED ORDER — FLUTICASONE PROPIONATE 50 MCG/ACT NA SUSP
1.0000 | Freq: Every day | NASAL | Status: DC
  Administered 2014-07-06 – 2014-07-07 (×2): 1 via NASAL
  Filled 2014-07-06: qty 16

## 2014-07-06 MED ORDER — CEFUROXIME AXETIL 500 MG PO TABS
500.0000 mg | ORAL_TABLET | Freq: Two times a day (BID) | ORAL | Status: DC
  Administered 2014-07-06 – 2014-07-07 (×3): 500 mg via ORAL
  Filled 2014-07-06 (×5): qty 1

## 2014-07-06 NOTE — Progress Notes (Signed)
Patient ID: Joshua Henson [facility]  male  VHQ:[id number]    DOB: 1945/12/24    DOA: 07/01/2014  PCP: Adella Hare, MD  Brief Narrative:  68 year old with history of prostate cancer status post radiation who presented to the ED after a syncopal episode of which he hit his head on the kitchen counter and suffered laceration to his left eyebrow. Reportedly patient had been drinking heavily prior to admission and on further evaluation was found to have orthostatic blood pressure changes. MRI evaluation of the head revealed new stroke of which cardiology has been consulted.   Assessment/Plan:  Principal Problem:  Syncope and collapse : Multifactorial, Possibly due to dehydration,was orthostatic on admission, heavy alcohol abuse leading to gait instability, acute vertigo (right BPPV), CVA on MRI -Improved with hydration - PT rec'd CIR   BPPV, right: With chronic sinusitis - Placed on meclizine schedule for 2 days, will have physical therapy see him again - Placed on Claritin, ceftin, flonase  Acute left frontal lobe punctate infarct/CVA with no localizing or lateralizing symptoms - Neurology was consulted, MRI positive for acute left frontal lobe punctate infarct  -CT angiogram of the head and neck normal  - 2-D echo showed normal EF with no wall motion abnormalities. Carotid Dopplers showed no critical ICA stenosis  - Continue aspirin, lipid panel showed LDL of 46, cholesterol 118  Scalp laceration  - CT head  showed no fracture    heavy Alcohol abuse  - on CIWA protocol, still somewhat tremulous  - Asking for Ativan, explained to the patient that he cannot be on scheduled Ativan at home. He may benefit from Klonopin for a couple of days and would need to see a psychiatrist or his own primary care physician if he needs any refills.   DVT Prophylaxis:  Code Status:  Family Communication: D/W patient's girlfriend in detail  Disposition: CIR  Consultants:  Neurology    Procedures:  MRI of the brain   Antibiotics:  None     Subjective: Patient seen and examined, feels whole lot better   Objective: Weight change: -1.225 kg (-2 lb 11.2 oz)  Intake/Output Summary (Last 24 hours) at 07/06/14 0954 Last data filed at 07/06/14 0900  Gross per 24 hour  Intake    360 ml  Output   1350 ml  Net   -990 ml   Blood pressure 147/96, pulse 87, temperature 99.4 F (37.4 C), temperature source Oral, resp. rate 18, height 5' 10.5" (1.791 m), weight 91.173 kg (201 lb), SpO2 94.00%.  Physical Exam: General: Alert and awake, oriented x3, NAD CVS: S1-S2 clear, no murmur rubs or gallops Chest: clear to auscultation bilaterally, no wheezing, rales or rhonchi Abdomen: soft nontender, nondistended, normal bowel sounds  Extremities: no cyanosis, clubbing or edema noted bilaterally   Lab Results: Basic Metabolic Panel:  Recent Labs Lab 07/04/14 2358 07/06/14 0430  NA 136* 142  K 3.6* 3.3*  CL 99 103  CO2 20 23  GLUCOSE 118* 131*  BUN 7 10  CREATININE 0.55 0.55  CALCIUM 8.6 8.6  MG  --  1.5   Liver Function Tests:  Recent Labs Lab 07/04/14 1610 07/06/14 0430  AST 115* 89*  ALT 62* 66*  ALKPHOS 69 75  BILITOT 1.0 0.7  PROT 6.6 6.3  ALBUMIN 3.3* 3.1*   CBC:  Recent Labs Lab 07/05/14 0803 07/06/14 0430  WBC 4.9 4.2  HGB 13.2 12.8*  HCT 36.6* 36.2*  MCV 99.2 99.5  PLT 172 170  Cardiac Enzymes:  Recent Labs Lab 07/02/14 0301 07/02/14 0816 07/02/14 1455  TROPONINI <0.30 <0.30 <0.30   BNP: No components found with this basename: POCBNP,  CBG:  Recent Labs Lab 07/02/14 0004  GLUCAP 119*     Micro Results: No results found for this or any previous visit (from the past 240 hour(s)).  Studies/Results: Ct Angio Head W/cm &/or Wo Cm  07/04/2014   CLINICAL DATA:  Left frontal stroke. Recent fall with head trauma. Weakness.  EXAM: CT ANGIOGRAPHY HEAD AND NECK  TECHNIQUE: Multidetector CT imaging of the head and neck was  performed using the standard protocol during bolus administration of intravenous contrast. Multiplanar CT image reconstructions and MIPs were obtained to evaluate the vascular anatomy. Carotid stenosis measurements (when applicable) are obtained utilizing NASCET criteria, using the distal internal carotid diameter as the denominator.  CONTRAST:  73mL OMNIPAQUE IOHEXOL 350 MG/ML SOLN  COMPARISON:  MRI same day.  Head CT 07/02/2014  FINDINGS: CTA NECK FINDINGS  Lung apices show mild scarring. Degenerated articulation of the sternum and left rib indents the lung and as associated with some pulmonary scarring.  There is mild atherosclerosis of the aorta. Branching pattern of the brachiocephalic vessels from the arch is normal. No origin stenoses.  The right common carotid artery is widely patent to the bifurcation. There is mild atherosclerotic calcification at the carotid bifurcation. Minimal diameter of the proximal ICA is 5 mm, the same as the more distal cervical ICA. Therefore, there is no stenosis.  The left common carotid artery is widely patent to the bifurcation. There is calcified plaque of the distal common carotid artery in the proximal internal carotid artery. Minimal diameter of the proximal ICA is 5 mm. Compared to a more distal cervical ICA diameter of 5 mm, there is no stenosis.  Both vertebral artery origins are widely patent. Both vertebral arteries appear widely patent through the cervical region.  No soft tissue lesion of the neck is evident. There is ordinary degenerative change of the cervical spine.  Review of the MIP images confirms the above findings.  CTA HEAD FINDINGS  Both internal carotid arteries are patent through the siphon regions. There is some wall calcification but there is no stenosis. The anterior and middle cerebral vessels are patent bilaterally without proximal stenosis, aneurysm or vascular malformation.  Both vertebral arteries are patent to the basilar. No basilar stenosis.  Posterior circulation branch vessels are normal.  Intracranial venous structures are patent and normal.  Chronic small vessel ischemic changes remain evident within the hemispheric white matter. No acute abnormality identifiable by CT.  Review of the MIP images confirms the above findings.  IMPRESSION: Ordinary atherosclerotic calcification at both carotid bifurcations but no stenosis.   Electronically Signed   By: Nelson Chimes M.D.   On: 07/04/2014 17:12   Dg Chest 2 View  07/01/2014   CLINICAL DATA:  Syncopal F cell falling for do not image chest  EXAM: CHEST  2 VIEW  COMPARISON:  None.  FINDINGS: The right hemidiaphragm is higher than the left. There is atelectasis anteriorly and posteriorly at the left lung base. There is no pneumothorax or pleural effusion. The right lung is clear. The cardiac silhouette is top-normal in size. The pulmonary vascularity is normal. There is mild tortuosity of the descending thoracic aorta. The observed portions of the ribs exhibit no acute abnormalities. The thoracic vertebral bodies are preserved in height.  IMPRESSION: There is atelectasis at the left lung base. There is no pneumothorax  nor acute rib fracture demonstrated.   Electronically Signed   By: David  Martinique   On: 07/01/2014 15:21   Ct Head Wo Contrast  07/02/2014   CLINICAL DATA:  Syncope and head injury  EXAM: CT HEAD WITHOUT CONTRAST  TECHNIQUE: Contiguous axial images were obtained from the base of the skull through the vertex without intravenous contrast.  COMPARISON:  01/07/2014 temporal bone CT  FINDINGS: Skull and Sinuses:There is a large left frontotemporal scalp laceration and contusion. No underlying fracture.  Probable mucous retention cyst in the posterior left maxillary sinus. An infraorbital air cell on the left may limit left maxillary sinus outflow. Chronic mucosal thickening and neoosteogenesis of the right maxillary sinus. These changes of chronic sinusitis are stable from previous.  Orbits: No  acute abnormality.  Brain: No evidence of acute abnormality, such as acute infarction, hemorrhage, hydrocephalus, or mass lesion/mass effect. Generalized cerebral and cerebellar volume loss which is borderline age advanced. There is mild chronic small vessel disease with patchy deep and periventricular cerebral white matter low density.  IMPRESSION: 1. No acute intracranial injury. 2. Left frontotemporal scalp contusion.  No calvarial fracture. 3. Chronic findings are noted above.   Electronically Signed   By: Jorje Guild M.D.   On: 07/02/2014 01:39   Ct Angio Neck W/cm &/or Wo/cm  07/04/2014   CLINICAL DATA:  Left frontal stroke. Recent fall with head trauma. Weakness.  EXAM: CT ANGIOGRAPHY HEAD AND NECK  TECHNIQUE: Multidetector CT imaging of the head and neck was performed using the standard protocol during bolus administration of intravenous contrast. Multiplanar CT image reconstructions and MIPs were obtained to evaluate the vascular anatomy. Carotid stenosis measurements (when applicable) are obtained utilizing NASCET criteria, using the distal internal carotid diameter as the denominator.  CONTRAST:  44mL OMNIPAQUE IOHEXOL 350 MG/ML SOLN  COMPARISON:  MRI same day.  Head CT 07/02/2014  FINDINGS: CTA NECK FINDINGS  Lung apices show mild scarring. Degenerated articulation of the sternum and left rib indents the lung and as associated with some pulmonary scarring.  There is mild atherosclerosis of the aorta. Branching pattern of the brachiocephalic vessels from the arch is normal. No origin stenoses.  The right common carotid artery is widely patent to the bifurcation. There is mild atherosclerotic calcification at the carotid bifurcation. Minimal diameter of the proximal ICA is 5 mm, the same as the more distal cervical ICA. Therefore, there is no stenosis.  The left common carotid artery is widely patent to the bifurcation. There is calcified plaque of the distal common carotid artery in the proximal  internal carotid artery. Minimal diameter of the proximal ICA is 5 mm. Compared to a more distal cervical ICA diameter of 5 mm, there is no stenosis.  Both vertebral artery origins are widely patent. Both vertebral arteries appear widely patent through the cervical region.  No soft tissue lesion of the neck is evident. There is ordinary degenerative change of the cervical spine.  Review of the MIP images confirms the above findings.  CTA HEAD FINDINGS  Both internal carotid arteries are patent through the siphon regions. There is some wall calcification but there is no stenosis. The anterior and middle cerebral vessels are patent bilaterally without proximal stenosis, aneurysm or vascular malformation.  Both vertebral arteries are patent to the basilar. No basilar stenosis. Posterior circulation branch vessels are normal.  Intracranial venous structures are patent and normal.  Chronic small vessel ischemic changes remain evident within the hemispheric white matter. No acute abnormality identifiable  by CT.  Review of the MIP images confirms the above findings.  IMPRESSION: Ordinary atherosclerotic calcification at both carotid bifurcations but no stenosis.   Electronically Signed   By: Nelson Chimes M.D.   On: 07/04/2014 17:12   Mr Brain Wo Contrast  07/04/2014   CLINICAL DATA:  Dizziness. Syncopal episode with trauma to head. Laceration over the left orbit. Orthostatics hypotension.  EXAM: MRI HEAD WITHOUT CONTRAST  TECHNIQUE: Multiplanar, multiecho pulse sequences of the brain and surrounding structures were obtained without intravenous contrast.  COMPARISON:  CT head without contrast 07/02/2014  FINDINGS: A punctate area of restricted diffusion is noted in the anterior left frontal lobe on image 20 of series 6. Mild periventricular white matter changes are noted bilaterally. There is a remote subcortical white matter infarct of the anterior left frontal lobe measuring 8.5 mm.  No hemorrhage or mass lesion is  present. The ventricles are of normal size. No significant extraaxial fluid collection is present.  Flow is present in the major intracranial arteries. The globes and orbits are intact. A polyp or mucous retention cyst is noted posteriorly in the left maxillary sinus. Chronic right maxillary sinus changes are evident. The remaining paranasal sinuses and the mastoid air cells are clear.  IMPRESSION: 1. Punctate nonhemorrhagic infarct involving the anterior left frontal lobe. 2. Remote 8 mm left frontal lobe white matter infarct. 3. Mild periventricular white matter changes bilaterally. 4. 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   Electronically Signed   By: Lawrence Santiago M.D.   On: 07/04/2014 11:12    Medications: Scheduled Meds: . aspirin EC  81 mg Oral Daily  . atorvastatin  10 mg Oral q1800  . cefUROXime  500 mg Oral BID WC  . enoxaparin (LOVENOX) injection  40 mg Subcutaneous Q24H  . feeding supplement (ENSURE)  1 Container Oral BID BM  . fluticasone  1 spray Each Nare Daily  . folic acid  1 mg Oral Daily  . loratadine  10 mg Oral Daily  . meclizine  25 mg Oral TID  . multivitamin with minerals  1 tablet Oral Daily  . potassium chloride  10 mEq Intravenous Q1 Hr x 2  . thiamine  100 mg Oral Daily      LOS: 5 days   Aamani Moose M.D. [facility] 07/06/2014, 9:54 AM Pager: [phone]  If 7PM-7AM, please contact night-coverage [email or web address] Password [email or web address]  **Disclaimer: This note was dictated with voice recognition software. Similar sounding words can inadvertently be transcribed and this note may contain transcription errors which may not have been corrected upon publication of note.**

## 2014-07-06 NOTE — Progress Notes (Signed)
Physical Therapy Treatment Patient Details Name: Joshua Henson MRN: [id number] DOB: [birth year] Today's Date: 07/06/2014    History of Present Illness 68 y.o. male with remote history of Prostate Cancer S/P Radiation Rx who presents to the ED after he got up to night to go into his kitchen and he passed out and hit his head on the kitchen counter and suffered a laceration to his left eyebrow area.  When he arrived at the ED, he was evaluated and found to have orthostatic hypotension with systolic blood pressures of that dropped from 140/60 to 60/40.  Pt with positive left frontal CVA.  Also positive for right BPPV.  Suspect hypofunction as well and will assess when BPPV is treated completely.      PT Comments    Patient continues to demonstrate dizziness and imbalance impacting mobility/safety.  Patient tested negative for Rt posterior canal BPPV today - canalith repositioning maneuver successful yesterday.  Patient does have additional vestibular issues.  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  Patient reports feeling "off balance" today with significant lean to right with gait.  Will continue to assess and treat.  Follow Up Recommendations  CIR     Equipment Recommendations  Rolling walker with 5" wheels    Recommendations for Other Services Rehab consult     Precautions / Restrictions Precautions Precautions: Fall Restrictions Weight Bearing Restrictions: No    Mobility  Bed Mobility Overal bed mobility: Modified Independent             General bed mobility comments: Moves to sitting with use of hand rail and increased time.  Transfers Overall transfer level: Needs assistance Equipment used: Rolling walker (2 wheeled) Transfers: Sit to/from Stand Sit to Stand: Mod assist         General transfer comment: Verbal cues for hand placement - continues to reach  for RW to pull to standing.  Assist to rise to standing and to maintain balance/safety.  Ambulation/Gait Ambulation/Gait assistance: Mod assist Ambulation Distance (Feet): 30 Feet Assistive device: Rolling walker (2 wheeled) Gait Pattern/deviations: Step-through pattern;Decreased stride length;Decreased weight shift to left;Ataxic;Staggering right;Trunk flexed Gait velocity: Decreased Gait velocity interpretation: Below normal speed for age/gender General Gait Details: Patient with significant lean to right today.  Attempted to work on shifting weight to left in standing.  Then returns to right lean with ambulation.  Required mod to max assist to prevent fall to right.  Patient reports feeling lightheaded and "not right".  Returned to sitting.   Stairs            Wheelchair Mobility    Modified Rankin (Stroke Patients Only) Modified Rankin (Stroke Patients Only) Pre-Morbid Rankin Score: No symptoms Modified Rankin: Moderately severe disability     Balance Overall balance assessment: Needs assistance Sitting-balance support: No upper extremity supported;Feet supported Sitting balance-Leahy Scale: Fair     Standing balance support: Bilateral upper extremity supported Standing balance-Leahy Scale: Poor Standing balance comment: Requires physical assist to maintain balance                    Cognition Arousal/Alertness: Awake/alert Behavior During Therapy: Impulsive Overall Cognitive Status: Impaired/Different from baseline Area of Impairment: Following commands;Safety/judgement       Following Commands: Follows multi-step commands with increased time Safety/Judgement: Decreased awareness of safety     General Comments: Less impulsive today    Exercises Other Exercises Other Exercises:  Performed Rt Marye Round for Rt posterior canal.  Tested negative for BPPV today.  (Canalith repositioning successful from yesterday) Other Exercises: Continues to have  dizziness that is ongoing and impacting mobility. Other Exercises: Treatment:  x1 exercises horizontally and vertically.  Instructed patient and significant other. Other Exercises: Patient has hearing loss bilaterally, with Lt worse than right.  Significant other reports that patient has been diagnosed by ENT with Secondary Endolymphatic hydrops.      General Comments        Pertinent Vitals/Pain     Home Living                      Prior Function            PT Goals (current goals can now be found in the care plan section) Progress towards PT goals: Progressing toward goals    Frequency  Min 3X/week    PT Plan Current plan remains appropriate    Co-evaluation             End of Session Equipment Utilized During Treatment: Gait belt Activity Tolerance: Patient limited by fatigue (Limited by dizziness/imbalance) Patient left: in bed;with call bell/phone within reach;with family/visitor present     Time: 9563-8756 PT Time Calculation (min): 32 min  Charges:  $Gait Training: 8-22 mins $Therapeutic Activity: 8-22 mins                    G Codes:      Despina Pole 2014-07-29, 9:37 PM Carita Pian. Sanjuana Kava, [place] Pager [phone]

## 2014-07-07 ENCOUNTER — Encounter (HOSPITAL_COMMUNITY): Payer: Self-pay | Admitting: *Deleted

## 2014-07-07 ENCOUNTER — Inpatient Hospital Stay (HOSPITAL_COMMUNITY)
Admission: RE | Admit: 2014-07-07 | Discharge: 2014-07-11 | DRG: 945 | Disposition: A | Discharge status: Home / Self Care | Payer: Medicare Other | Source: Intra-hospital | Attending: Physical Medicine & Rehabilitation | Admitting: Physical Medicine & Rehabilitation

## 2014-07-07 DIAGNOSIS — Z8042 Family history of malignant neoplasm of prostate: Secondary | ICD-10-CM | POA: Diagnosis not present

## 2014-07-07 DIAGNOSIS — Z800 Family history of malignant neoplasm of digestive organs: Secondary | ICD-10-CM | POA: Diagnosis not present

## 2014-07-07 DIAGNOSIS — E785 Hyperlipidemia, unspecified: Secondary | ICD-10-CM | POA: Diagnosis present

## 2014-07-07 DIAGNOSIS — F1010 Alcohol abuse, uncomplicated: Secondary | ICD-10-CM

## 2014-07-07 DIAGNOSIS — Z5189 Encounter for other specified aftercare: Principal | ICD-10-CM

## 2014-07-07 DIAGNOSIS — R42 Dizziness and giddiness: Secondary | ICD-10-CM | POA: Diagnosis present

## 2014-07-07 DIAGNOSIS — J320 Chronic maxillary sinusitis: Secondary | ICD-10-CM | POA: Diagnosis present

## 2014-07-07 DIAGNOSIS — Z888 Allergy status to other drugs, medicaments and biological substances status: Secondary | ICD-10-CM | POA: Diagnosis not present

## 2014-07-07 DIAGNOSIS — H905 Unspecified sensorineural hearing loss: Secondary | ICD-10-CM | POA: Diagnosis present

## 2014-07-07 DIAGNOSIS — Z79899 Other long term (current) drug therapy: Secondary | ICD-10-CM

## 2014-07-07 DIAGNOSIS — Z923 Personal history of irradiation: Secondary | ICD-10-CM

## 2014-07-07 DIAGNOSIS — R296 Repeated falls: Secondary | ICD-10-CM | POA: Diagnosis present

## 2014-07-07 DIAGNOSIS — F17200 Nicotine dependence, unspecified, uncomplicated: Secondary | ICD-10-CM | POA: Diagnosis present

## 2014-07-07 DIAGNOSIS — Z8546 Personal history of malignant neoplasm of prostate: Secondary | ICD-10-CM | POA: Diagnosis not present

## 2014-07-07 DIAGNOSIS — Z7982 Long term (current) use of aspirin: Secondary | ICD-10-CM

## 2014-07-07 DIAGNOSIS — Y92009 Unspecified place in unspecified non-institutional (private) residence as the place of occurrence of the external cause: Secondary | ICD-10-CM | POA: Diagnosis not present

## 2014-07-07 DIAGNOSIS — Z8249 Family history of ischemic heart disease and other diseases of the circulatory system: Secondary | ICD-10-CM

## 2014-07-07 DIAGNOSIS — Z833 Family history of diabetes mellitus: Secondary | ICD-10-CM

## 2014-07-07 DIAGNOSIS — F341 Dysthymic disorder: Secondary | ICD-10-CM | POA: Diagnosis present

## 2014-07-07 DIAGNOSIS — I6330 Cerebral infarction due to thrombosis of unspecified cerebral artery: Secondary | ICD-10-CM | POA: Diagnosis present

## 2014-07-07 DIAGNOSIS — S0180XA Unspecified open wound of other part of head, initial encounter: Secondary | ICD-10-CM | POA: Diagnosis present

## 2014-07-07 DIAGNOSIS — I10 Essential (primary) hypertension: Secondary | ICD-10-CM | POA: Diagnosis present

## 2014-07-07 HISTORY — DX: Cerebral infarction, unspecified: I63.9

## 2014-07-07 HISTORY — DX: Depression, unspecified: F32.A

## 2014-07-07 HISTORY — DX: Major depressive disorder, single episode, unspecified: F32.9

## 2014-07-07 MED ORDER — CEFUROXIME AXETIL 500 MG PO TABS: 500.0000 mg | ORAL_TABLET | Freq: Two times a day (BID) | ORAL | Status: DC

## 2014-07-07 MED ORDER — ATORVASTATIN CALCIUM 10 MG PO TABS
10.0000 mg | ORAL_TABLET | Freq: Every day | ORAL | Status: DC
Start: 2014-07-07 — End: 2014-07-11
  Administered 2014-07-07 – 2014-07-10 (×4): 10 mg via ORAL
  Filled 2014-07-07 (×5): qty 1

## 2014-07-07 MED ORDER — LORATADINE 10 MG PO TABS
10.0000 mg | ORAL_TABLET | Freq: Every day | ORAL | Status: DC
  Administered 2014-07-08 – 2014-07-11 (×4): 10 mg via ORAL
  Filled 2014-07-07 (×5): qty 1

## 2014-07-07 MED ORDER — LORATADINE 10 MG PO TABS: 10.0000 mg | ORAL_TABLET | Freq: Every day | ORAL | Status: DC

## 2014-07-07 MED ORDER — ENOXAPARIN SODIUM 40 MG/0.4ML ~~LOC~~ SOLN: 40.0000 mg | SUBCUTANEOUS | Status: DC

## 2014-07-07 MED ORDER — OXYCODONE HCL 5 MG PO TABS
5.0000 mg | ORAL_TABLET | ORAL | Status: DC | PRN
  Administered 2014-07-10: 5 mg via ORAL
  Filled 2014-07-07 (×2): qty 1

## 2014-07-07 MED ORDER — ENSURE PUDDING PO PUDG
1.0000 | Freq: Two times a day (BID) | ORAL | Status: DC
  Administered 2014-07-08: 1 via ORAL

## 2014-07-07 MED ORDER — FOLIC ACID 1 MG PO TABS
1.0000 mg | ORAL_TABLET | Freq: Every day | ORAL | Status: DC
  Administered 2014-07-08 – 2014-07-11 (×4): 1 mg via ORAL
  Filled 2014-07-07 (×6): qty 1

## 2014-07-07 MED ORDER — ADULT MULTIVITAMIN W/MINERALS CH
1.0000 | ORAL_TABLET | Freq: Every day | ORAL | Status: DC
  Administered 2014-07-08 – 2014-07-11 (×4): 1 via ORAL
  Filled 2014-07-07 (×6): qty 1

## 2014-07-07 MED ORDER — CLONAZEPAM 1 MG PO TABS: 1.0000 mg | ORAL_TABLET | Freq: Two times a day (BID) | ORAL | Status: DC

## 2014-07-07 MED ORDER — FLUTICASONE PROPIONATE 50 MCG/ACT NA SUSP
1.0000 | Freq: Every day | NASAL | Status: DC
  Administered 2014-07-08 – 2014-07-11 (×4): 1 via NASAL
  Filled 2014-07-07: qty 16

## 2014-07-07 MED ORDER — ENOXAPARIN SODIUM 40 MG/0.4ML ~~LOC~~ SOLN
40.0000 mg | SUBCUTANEOUS | Status: DC
  Administered 2014-07-08 – 2014-07-10 (×3): 40 mg via SUBCUTANEOUS
  Filled 2014-07-07 (×4): qty 0.4

## 2014-07-07 MED ORDER — ASPIRIN 81 MG PO TBEC: 81.0000 mg | DELAYED_RELEASE_TABLET | Freq: Every day | ORAL | Status: DC

## 2014-07-07 MED ORDER — ACETAMINOPHEN 325 MG PO TABS: 325.0000 mg | ORAL_TABLET | ORAL | Status: DC | PRN

## 2014-07-07 MED ORDER — CEFUROXIME AXETIL 500 MG PO TABS
500.0000 mg | ORAL_TABLET | Freq: Two times a day (BID) | ORAL | Status: DC
  Administered 2014-07-07 – 2014-07-11 (×8): 500 mg via ORAL
  Filled 2014-07-07 (×10): qty 1

## 2014-07-07 MED ORDER — ASPIRIN EC 81 MG PO TBEC
81.0000 mg | DELAYED_RELEASE_TABLET | Freq: Every day | ORAL | Status: DC
  Administered 2014-07-08 – 2014-07-11 (×4): 81 mg via ORAL
  Filled 2014-07-07 (×5): qty 1

## 2014-07-07 MED ORDER — ADULT MULTIVITAMIN W/MINERALS CH: 1.0000 | ORAL_TABLET | Freq: Every day | ORAL | Status: DC

## 2014-07-07 MED ORDER — FLUTICASONE PROPIONATE 50 MCG/ACT NA SUSP: 1.0000 | Freq: Every day | NASAL | Status: DC

## 2014-07-07 MED ORDER — TAMSULOSIN HCL 0.4 MG PO CAPS
0.4000 mg | ORAL_CAPSULE | Freq: Every day | ORAL | Status: DC
  Administered 2014-07-08 – 2014-07-11 (×4): 0.4 mg via ORAL
  Filled 2014-07-07 (×5): qty 1

## 2014-07-07 MED ORDER — ONDANSETRON HCL 4 MG/2ML IJ SOLN: 4.0000 mg | Freq: Four times a day (QID) | INTRAMUSCULAR | Status: DC | PRN

## 2014-07-07 MED ORDER — OXYCODONE HCL 5 MG PO TABS: 5.0000 mg | ORAL_TABLET | ORAL | Status: DC | PRN

## 2014-07-07 MED ORDER — THIAMINE HCL 100 MG PO TABS: 100.0000 mg | ORAL_TABLET | Freq: Every day | ORAL | Status: DC

## 2014-07-07 MED ORDER — VITAMIN B-1 100 MG PO TABS
100.0000 mg | ORAL_TABLET | Freq: Every day | ORAL | Status: DC
  Administered 2014-07-08 – 2014-07-11 (×4): 100 mg via ORAL
  Filled 2014-07-07 (×5): qty 1

## 2014-07-07 MED ORDER — LISINOPRIL 20 MG PO TABS
20.0000 mg | ORAL_TABLET | Freq: Every day | ORAL | Status: DC
  Administered 2014-07-08 – 2014-07-11 (×4): 20 mg via ORAL
  Filled 2014-07-07 (×5): qty 1

## 2014-07-07 MED ORDER — ONDANSETRON HCL 4 MG PO TABS: 4.0000 mg | ORAL_TABLET | Freq: Four times a day (QID) | ORAL | Status: DC | PRN

## 2014-07-07 MED ORDER — ALUM & MAG HYDROXIDE-SIMETH 200-200-20 MG/5ML PO SUSP: 30.0000 mL | Freq: Four times a day (QID) | ORAL | Status: DC | PRN

## 2014-07-07 MED ORDER — HYDROCHLOROTHIAZIDE 12.5 MG PO CAPS
12.5000 mg | ORAL_CAPSULE | Freq: Every day | ORAL | Status: DC
  Administered 2014-07-08 – 2014-07-11 (×4): 12.5 mg via ORAL
  Filled 2014-07-07 (×5): qty 1

## 2014-07-07 MED ORDER — AMLODIPINE BESYLATE 10 MG PO TABS
10.0000 mg | ORAL_TABLET | Freq: Every day | ORAL | Status: DC
  Administered 2014-07-08 – 2014-07-11 (×4): 10 mg via ORAL
  Filled 2014-07-07 (×5): qty 1

## 2014-07-07 MED ORDER — MECLIZINE HCL 25 MG PO TABS
25.0000 mg | ORAL_TABLET | Freq: Three times a day (TID) | ORAL | Status: AC
  Administered 2014-07-07 – 2014-07-09 (×6): 25 mg via ORAL
  Filled 2014-07-07 (×6): qty 1

## 2014-07-07 MED ORDER — LORAZEPAM 0.5 MG PO TABS
1.0000 mg | ORAL_TABLET | Freq: Three times a day (TID) | ORAL | Status: DC
  Administered 2014-07-07 – 2014-07-11 (×12): 1 mg via ORAL
  Filled 2014-07-07 (×12): qty 2

## 2014-07-07 NOTE — Progress Notes (Signed)
PMR Admission Coordinator Pre-Admission Assessment  Patient: Joshua Henson is an 68 y.o., male  MRN: [id number]  DOB: [birth year]  Height: 5' 10.5" (179.1 cm)  Weight: 90.946 kg (200 lb 8 oz)  Insurance Information  HMO: PPO: PCP: IPA: 80/20: yes OTHER: no HMO  PRIMARY: Medicare a and b Policy#: [id number] Subscriber: pt  Benefits: Phone #: [email or web address] Name: 07/07/14  Eff. 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 right-handed male with history of prostate cancer status post radiation as well as alcohol abuse. 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 Alcohol level 155 upon admission. MRI of the brain shows punctate nonhemorrhagic infarct involving the anterior left frontal lobe, remote 63mm left frontal lobe white matter infarct. Cardiac enzymes negative. Patient did not receive TPA.  Echocardiogram with normal systolic function no wall motion abnormalities. CT angiogram head and neck with atherosclerotic type changes no stenosis. Neurology services consulted placed on aspirin for CVA prophylaxis as well as subcutaneous Lovenox for DVT prophylaxis. Patient is tolerating a regular  consistency diet. Patient gives history of middle ear fluid. Has [facility].  Total: 0 NIH   Past Medical History  Past Medical History   Diagnosis  Date   .  Prostate cancer  2010     External beam radiation (urol - Grapey, XRT Valere Dross)    Family History  family history includes Cancer in his father, mother, and paternal grandfather; Diabetes in his mother; Heart disease in his maternal grandfather; Hypertension in his mother.  Prior Rehab/Hospitalizations: none  Current Medications  Current facility-administered medications:acetaminophen (TYLENOL) suppository 650 mg, 650 mg, Rectal, Q6H PRN, Theressa Millard, MD; acetaminophen (TYLENOL) tablet 650 mg, 650 mg, Oral, Q6H PRN, Theressa Millard, MD; alum & mag hydroxide-simeth (MAALOX/MYLANTA) 200-200-20 MG/5ML suspension 30 mL, 30 mL, Oral, Q6H PRN, Theressa Millard, MD; aspirin EC tablet 81 mg, 81 mg, Oral, Daily, Marliss Coots, PA-C, 81 mg at 07/07/14 0956  atorvastatin (LIPITOR) tablet 10 mg, 10 mg, Oral, q1800, Hulen Luster, DO, 10 mg at 07/06/14 1701; cefUROXime (CEFTIN) tablet 500 mg, 500 mg, Oral, BID WC, Ripudeep K Rai, MD, 500 mg at 07/07/14 0855; enoxaparin (LOVENOX) injection 40 mg, 40 mg, Subcutaneous, Q24H, Harvette Evonnie Dawes, MD, 40 mg at 07/07/14 1001  feeding supplement (ENSURE) (ENSURE) pudding 1 Container, 1 Container, Oral, BID BM, Velvet Bathe, MD, 1 Container at 07/07/14 0957; fluticasone (FLONASE) 50 MCG/ACT nasal spray 1 spray, 1 spray, Each Nare, Daily, Ripudeep K Rai, MD, 1 spray at 87/56/43 3295; folic acid (FOLVITE) tablet 1 mg, 1 mg, Oral, Daily, Theressa Millard, MD, 1 mg at 07/07/14 0956  loratadine (CLARITIN)  tablet 10 mg, 10 mg, Oral, Daily, Ripudeep K Rai, MD, 10 mg at 07/07/14 0956; LORazepam (ATIVAN) tablet 1 mg, 1 mg, Oral, Q8H PRN, Ripudeep K Rai, MD, 1 mg at 07/07/14 4193; multivitamin with minerals tablet 1 tablet, 1 tablet, Oral, Daily, Theressa Millard, MD, 1 tablet at 07/07/14 0956;  ondansetron (ZOFRAN) injection 4 mg, 4 mg, Intravenous, Q6H PRN, Theressa Millard, MD  ondansetron (ZOFRAN) tablet 4 mg, 4 mg, Oral, Q6H PRN, Theressa Millard, MD; oxyCODONE (Oxy IR/ROXICODONE) immediate release tablet 5 mg, 5 mg, Oral, Q4H PRN, Theressa Millard, MD; thiamine (VITAMIN B-1) tablet 100 mg, 100 mg, Oral, Daily, Theressa Millard, MD, 100 mg at 07/07/14 7902  Patients Current Diet: Cardiac  Precautions / Restrictions  Precautions  Precautions: Fall  Precaution Comments: multiple syncopal episodes  Restrictions  Weight Bearing Restrictions: No  Prior Activity Level  Limited Community (1-2x/wk): pt's lifestyle has changed over last 3 months due to vertigo issues at home. 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 Goes to [employer] a couple times per week. drives self. Vetigo issues since 10/2013  Current Functional Level  Cognition  Overall Cognitive Status: Impaired/Different from baseline  Orientation Level: Oriented X4  Following Commands: Follows multi-step commands with increased time  Safety/Judgement: Decreased awareness of safety  General Comments: Anxious and impulsive today. Continues to need cuing for safe hand placement with sit <> stand.   Extremity Assessment  (includes Sensation/Coordination)     ADLs    Mobility  Overal bed mobility: Modified Independent  General bed mobility comments: Moves to sitting with use of hand rail and increased time.   Transfers  Overall transfer level: Needs assistance  Equipment used: Rolling walker (2 wheeled)  Transfers: Sit to/from Stand  Sit to Stand: Mod assist  Stand pivot transfers: Mod assist  General transfer comment: Needed repeated cuing for hand placement to stand. Assist  to rise to standing and for balance. Worked in standing on midline posture and balance.   Ambulation / Gait / Stairs / Wheelchair Mobility  Ambulation/Gait  Ambulation/Gait assistance: Mod assist  Ambulation Distance (Feet): 60 Feet  Assistive device: Rolling walker (2 wheeled)  Gait Pattern/deviations: Step-through pattern;Decreased stride length;Ataxic;Staggering left;Staggering right;Trunk flexed  Gait velocity: Decreased  Gait velocity interpretation: Below normal speed for age/gender  General Gait Details: Patient continues to require mod assist due to decreased balance/loss of balance during gait, especially during turns. Continued to have patient focus on target during gait.   Posture / Balance  Dynamic Sitting Balance  Sitting balance - Comments: required UE support; tolerated sitting EOB total ~5 min   Special needs/care consideration  Skin l scalp laceration  Bowel mgmt: continent  Bladder mgmt: foley  Pt drinks 1 pint vodka per day. Has recently tried to cut back with help of his PCP who placed him on ativan 1 mg tid. Pt states numerous stressors with work and family. Sleeps only 4 to 5 hrs per night and self medicates to relax per pt.  HTN meds- on meds pta not restarted in hospital  Pt admits to depression. Has sought counseling in the past and open to recommendations from CIR team. Does not want to see Dr. Letta Moynahan for she is MD for his exwife.  Previous Home Environment  Living Arrangements: Spouse/significant other;Other (Comment) [name] and pt together for 6 years)  Lives With: Significant other;Other (Comment) [name])  Available Help at Discharge: Friend(s);Other (Comment) [name] can work from home to provide 24/7; Cabin crew)  Type of Home: House  Home Layout: Two level  Home Access: Stairs to enter  Technical brewer of Steps: 2-3  Bathroom Shower/Tub: Tourist information centre manager: Standard  Bathroom Accessibility: Yes  How Accessible: Accessible  via walker  [place]: No  Discharge Living Setting  Plans for Discharge Living Setting: Patient's home;Lives with (comment);Other (Comment) ([place], girlfriend)  Type of Home at Discharge: House  Discharge Home Layout: Two level;Able to live on main level with bedroom/bathroom;Full bath on main level  Discharge Home Access: Stairs to enter  Entrance Stairs-Number of Steps: 2 to 3 at all 3 entrances  Discharge Bathroom Shower/Tub: Walk-in shower  Discharge Bathroom Toilet: Standard  Discharge Bathroom Accessibility: Yes  How Accessible: Accessible via walker  Does the patient have any problems obtaining your medications?: No  Social/Family/Support Systems  Patient Roles: Partner;Parent;Other (Comment) (self employed Engineer, 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 Daughter 37, twin sons 50 yo. He does not want them to visit initially and he has told children and law partner very little of what he is admitted for. [name] feels community feels he is admitted for alcohol problems for pt not disclosing he has had a CVA. Hall very involved and I had to clarify with pt that he wants information shared with [name] as well as himself. [name] and pt have a lot of anxiety and need a lot of direction towards what can be managed inpt rehab vs outpt follow up with PCP.  Goals/Additional Needs  Patient/Family Goal for Rehab: Mod I to supervision with PT, OT, and SLP  Expected  length of stay: [facility] 7 to 10 days  Special Service Needs: bed and chair alarms needed for decreased safety  Pt/Family Agrees to Admission and willing to participate: Yes  Program Orientation Provided & Reviewed with Pt/Caregiver Including Roles & Responsibilities: Yes  Decrease burden of Care through IP rehab admission: n/a  Possible need for SNF placement upon discharge:not anticipated  Patient Condition: This patient's condition remains as documented in the consult dated 07/07/2014, in which the Rehabilitation Physician determined and documented that the patient's condition is appropriate for intensive rehabilitative care in an inpatient rehabilitation facility. Will admit to inpatient rehab today.  Preadmission Screen Completed By: Cleatrice Burke, 07/07/2014 12:59 PM  ______________________________________________________________________  Discussed status with Dr. Naaman Plummer on 07/07/2014 at 1258 and received telephone approval for admission today.  Admission Coordinator: Cleatrice Burke, time 9678 Date 07/07/2014.  Cosigned by: Meredith Staggers, MD [07/07/2014 1:09 PM]   Revision History.Marland KitchenMarland Kitchen

## 2014-07-07 NOTE — Discharge Instructions (Addendum)
STROKE/TIA DISCHARGE INSTRUCTIONS SMOKING Cigarette smoking nearly doubles your risk of having a stroke & is the single most alterable risk factor  If you smoke or have smoked in the last 12 months, you are advised to quit smoking for your health.  Most of the excess cardiovascular risk related to smoking disappears within a year of stopping.  Ask you doctor about anti-smoking medications  [facility] Quit Line: 1-800-QUIT NOW  Free Smoking Cessation Classes [phone]  CHOLESTEROL Know your levels; limit fat & cholesterol in your diet  Lipid Panel     Component Value Date/Time   CHOL 118 07/04/2014 2358   TRIG 77 07/04/2014 2358   HDL 57 07/04/2014 2358   CHOLHDL 2.1 07/04/2014 2358   VLDL 15 07/04/2014 2358   LDLCALC 46 07/04/2014 2358      Many patients benefit from treatment even if their cholesterol is at goal.  Goal: Total Cholesterol (CHOL) less than 160  Goal:  Triglycerides (TRIG) less than 150  Goal:  HDL greater than 40  Goal:  LDL (LDLCALC) less than 100   BLOOD PRESSURE American Stroke Association blood pressure target is less that 120/80 mm/Hg  Your discharge blood pressure is:  BP: 161/98 mmHg  Monitor your blood pressure  Limit your salt and alcohol intake  Many individuals will require more than one medication for high blood pressure  DIABETES (A1c is a blood sugar average for last 3 months) Goal HGBA1c is under 7% (HBGA1c is blood sugar average for last 3 months)  Diabetes: No known diagnosis of diabetes    Lab Results  Component Value Date   HGBA1C 5.6 07/04/2014     Your HGBA1c can be lowered with medications, healthy diet, and exercise.  Check your blood sugar as directed by your physician  Call your physician if you experience unexplained or low blood sugars.  PHYSICAL ACTIVITY/REHABILITATION Goal is 30 minutes at least 4 days per week  Activity: Increase activity slowly, Therapies: INPATIENT REHAB Return to work: N/A  Activity decreases your risk of  heart attack and stroke and makes your heart stronger.  It helps control your weight and blood pressure; helps you relax and can improve your mood.  Participate in a regular exercise program.  Talk with your doctor about the best form of exercise for you (dancing, walking, swimming, cycling).  DIET/WEIGHT Goal is to maintain a healthy weight  Your discharge diet is: Cardiac THINS liquids Your height is:  Height: 5' 10.5" (179.1 cm) Your current weight is: Weight: 90.946 kg (200 lb 8 oz) Your Body Mass Index (BMI) is:  BMI (Calculated): 29.1  Following the type of diet specifically designed for you will help prevent another stroke.  Your goal weight range is:    133  -  167  Your goal Body Mass Index (BMI) is 19-24.  Healthy food habits can help reduce 3 risk factors for stroke:  High cholesterol, hypertension, and excess weight.  RESOURCES Stroke/Support Group:  Call [phone]   STROKE EDUCATION PROVIDED/REVIEWED AND GIVEN TO PATIENT Stroke warning signs and symptoms How to activate emergency medical system (call 911). Medications prescribed at discharge. Need for follow-up after discharge. Personal risk factors for stroke. Pneumonia vaccine given: No Flu vaccine given: No My questions have been answered, the writing is legible, and I understand these instructions.  I will adhere to these goals & educational materials that have been provided to me after my discharge from the hospital.     Stroke Prevention Some medical  conditions and behaviors are associated with an increased chance of having a stroke. You may prevent a stroke by making healthy choices and managing medical conditions. HOW CAN I REDUCE MY RISK OF HAVING A STROKE?   Stay physically active. Get at least 30 minutes of activity on most or all days.  Do not smoke. It may also be helpful to avoid exposure to secondhand smoke.  Limit alcohol use. Moderate alcohol use is considered to be:  No more than 2 drinks per  day for men.  No more than 1 drink per day for nonpregnant women.  Eat healthy foods. This involves  Eating 5 or more servings of fruits and vegetables a day.  Following a diet that addresses high blood pressure (hypertension), high cholesterol, diabetes, or obesity.  Manage your cholesterol levels.  A diet low in saturated fat, trans fat, and cholesterol and high in fiber may control cholesterol levels.  Take any prescribed medicines to control cholesterol as directed by your health care provider.  Manage your diabetes.  A controlled-carbohydrate, controlled-sugar diet is recommended to manage diabetes.  Take any prescribed medicines to control diabetes as directed by your health care provider.  Control your hypertension.  A low-salt (sodium), low-saturated fat, low-trans fat, and low-cholesterol diet is recommended to manage hypertension.  Take any prescribed medicines to control hypertension as directed by your health care provider.  Maintain a healthy weight.  A reduced-calorie, low-sodium, low-saturated fat, low-trans fat, low-cholesterol diet is recommended to manage weight.  Stop drug abuse.  Avoid taking birth control pills.  Talk to your health care provider about the risks of taking birth control pills if you are over 39 years old, smoke, get migraines, or have ever had a blood clot.  Get evaluated for sleep disorders (sleep apnea).  Talk to your health care provider about getting a sleep evaluation if you snore a lot or have excessive sleepiness.  Take medicines as directed by your health care provider.  For some people, aspirin or blood thinners (anticoagulants) are helpful in reducing the risk of forming abnormal blood clots that can lead to stroke. If you have the irregular heart rhythm of atrial fibrillation, you should be on a blood thinner unless there is a good reason you cannot take them.  Understand all your medicine instructions.  Make sure that  other other conditions (such as anemia or atherosclerosis) are addressed. SEEK IMMEDIATE MEDICAL CARE IF:   You have sudden weakness or numbness of the face, arm, or leg, especially on one side of the body.  Your face or eyelid droops to one side.  You have sudden confusion.  You have trouble speaking (aphasia) or understanding.  You have sudden trouble seeing in one or both eyes.  You have sudden trouble walking.  You have dizziness.  You have a loss of balance or coordination.  You have a sudden, severe headache with no known cause.  You have new chest pain or an irregular heartbeat. Any of these symptoms may represent a serious problem that is an emergency. Do not wait to see if the symptoms will go away. Get medical help at once. Call your local emergency services  (911 in [place] Do not drive yourself to the hospital. Document Released: 01/12/2005 Document Revised: 09/25/2013 Document Reviewed: 06/07/2013 [facility] Patient Information 2015 [place], [place]. This information is not intended to replace advice given to you by your health care provider. Make sure you discuss any questions you have with your health care provider.  Cardiac Diet This diet can help prevent heart disease and stroke. Many factors influence your heart health, including eating and exercise habits. Coronary risk rises a lot with abnormal blood fat (lipid) levels. Cardiac meal planning includes limiting unhealthy fats, increasing healthy fats, and making other small dietary changes. General guidelines are as follows:  Adjust calorie intake to reach and maintain desirable body weight.  Limit total fat intake to less than 30% of total calories. Saturated fat should be less than 7% of calories.  Saturated fats are found in animal products and in some vegetable products. Saturated vegetable fats are found in coconut oil, cocoa butter, palm oil, and palm kernel oil. Read labels carefully to avoid these products as  much as possible. Use butter in moderation. Choose tub margarines and oils that have 2 grams of fat or less. Good cooking oils are canola and olive oils.  Practice low-fat cooking techniques. Do not fry food. Instead, broil, bake, boil, steam, grill, roast on a rack, stir-fry, or microwave it. Other fat reducing suggestions include:  Remove the skin from poultry.  Remove all visible fat from meats.  Skim the fat off stews, soups, and gravies before serving them.  Steam vegetables in water or broth instead of sauting them in fat.  Avoid foods with trans fat (or hydrogenated oils), such as commercially fried foods and commercially baked goods. Commercial shortening and deep-frying fats will contain trans fat.  Increase intake of fruits, vegetables, whole grains, and legumes to replace foods high in fat.  Increase consumption of nuts, legumes, and seeds to at least 4 servings weekly. One serving of a legume equals  cup, and 1 serving of nuts or seeds equals  cup.  Choose whole grains more often. Have 3 servings per day (a serving is 1 ounce [oz]).  Eat 4 to 5 servings of vegetables per day. A serving of vegetables is 1 cup of raw leafy vegetables;  cup of raw or cooked cut-up vegetables;  cup of vegetable juice.  Eat 4 to 5 servings of fruit per day. A serving of fruit is 1 medium whole fruit;  cup of dried fruit;  cup of fresh, frozen, or canned fruit;  cup of 100% fruit juice.  Increase your intake of dietary fiber to 20 to 30 grams per day. Insoluble fiber may help lower your risk of heart disease and may help curb your appetite.  Soluble fiber binds cholesterol to be removed from the blood. Foods high in soluble fiber are dried beans, citrus fruits, oats, apples, bananas, broccoli, Brussels sprouts, and eggplant.  Try to include foods fortified with plant sterols or stanols, such as yogurt, breads, juices, or margarines. Choose several fortified foods to achieve a daily intake of  2 to 3 grams of plant sterols or stanols.  Foods with omega-3 fats can help reduce your risk of heart disease. Aim to have a 3.5 oz portion of fatty fish twice per week, such as salmon, mackerel, albacore tuna, sardines, lake trout, or herring. If you wish to take a fish oil supplement, choose one that contains 1 gram of both DHA and EPA.  Limit processed meats to 2 servings (3 oz portion) weekly.  Limit the sodium in your diet to 1500 milligrams (mg) per day. If you have high blood pressure, talk to a registered dietitian about a DASH (Dietary Approaches to Stop Hypertension) eating plan.  Limit sweets and beverages with added sugar, such as soda, to no more than 5 servings per week.  One serving is:   1 tablespoon sugar.  1 tablespoon jelly or jam.   cup sorbet.  1 cup lemonade.   cup regular soda. CHOOSING FOODS Starches  Allowed: Breads: All kinds (wheat, rye, raisin, white, oatmeal, [place], [place], and English muffin bread). Low-fat rolls: English muffins, frankfurter and hamburger buns, bagels, pita bread, tortillas (not fried). Pancakes, waffles, biscuits, and muffins made with recommended oil.  Avoid: Products made with saturated or trans fats, oils, or whole milk products. Butter rolls, cheese breads, croissants. Commercial doughnuts, muffins, sweet rolls, biscuits, waffles, pancakes, store-bought mixes. Crackers  Allowed: Low-fat crackers and snacks: Animal, graham, rye, saltine (with recommended oil, no lard), oyster, and matzo crackers. Bread sticks, melba toast, rusks, flatbread, pretzels, and light popcorn.  Avoid: High-fat crackers: cheese crackers, butter crackers, and those made with coconut, palm oil, or trans fat (hydrogenated oils). Buttered popcorn. Cereals  Allowed: Hot or cold whole-grain cereals.  Avoid: Cereals containing coconut, hydrogenated vegetable fat, or animal fat. Potatoes / Pasta / Rice  Allowed: All kinds of potatoes, rice, and pasta (such  as macaroni, spaghetti, and noodles).  Avoid: Pasta or rice prepared with cream sauce or high-fat cheese. Chow mein noodles, [place] fries. Vegetables  Allowed: All vegetables and vegetable juices.  Avoid: Fried vegetables. Vegetables in cream, butter, or high-fat cheese sauces. Limit coconut. Fruit in cream or custard. Protein  Allowed: Limit your intake of meat, seafood, and poultry to no more than 6 oz (cooked weight) per day. All lean, well-trimmed beef, veal, pork, and lamb. All chicken and [place] without skin. All fish and shellfish. Wild game: wild duck, rabbit, pheasant, and venison. Egg whites or low-cholesterol egg substitutes may be used as desired. Meatless dishes: recipes with dried beans, peas, lentils, and tofu (soybean curd). Seeds and nuts: all seeds and most nuts.  Avoid: Prime grade and other heavily marbled and fatty meats, such as short ribs, spare ribs, rib eye roast or steak, frankfurters, sausage, bacon, and high-fat luncheon meats, mutton. Caviar. Commercially fried fish. Domestic duck, goose, venison sausage. Organ meats: liver, gizzard, heart, chitterlings, brains, kidney, sweetbreads. Dairy  Allowed: Low-fat cheeses: nonfat or low-fat cottage cheese (1% or 2% fat), cheeses made with part skim milk, such as mozzarella, farmers, string, or ricotta. (Cheeses should be labeled no more than 2 to 6 grams fat per oz.). Skim (or 1%) milk: liquid, powdered, or evaporated. Buttermilk made with low-fat milk. Drinks made with skim or low-fat milk or cocoa. Chocolate milk or cocoa made with skim or low-fat (1%) milk. Nonfat or low-fat yogurt.  Avoid: Whole milk cheeses, including colby, cheddar, [place], Monterey Jack, [place], [place], [place], American, Swiss, and blue. Creamed cottage cheese, cream cheese. Whole milk and whole milk products, including buttermilk or yogurt made from whole milk, drinks made from whole milk. Condensed milk, evaporated whole milk, and 2% milk. Soups  and Combination Foods  Allowed: Low-fat low-sodium soups: broth, dehydrated soups, homemade broth, soups with the fat removed, homemade cream soups made with skim or low-fat milk. Low-fat spaghetti, lasagna, chili, and Spanish rice if low-fat ingredients and low-fat cooking techniques are used.  Avoid: Cream soups made with whole milk, cream, or high-fat cheese. All other soups. Desserts and Sweets  Allowed: Sherbet, fruit ices, gelatins, meringues, and angel food cake. Homemade desserts with recommended fats, oils, and milk products. Jam, jelly, honey, marmalade, sugars, and syrups. Pure sugar candy, such as gum drops, hard candy, jelly beans, marshmallows, mints, and small amounts of dark chocolate.  Avoid: Commercially  prepared cakes, pies, cookies, frosting, pudding, or mixes for these products. Desserts containing whole milk products, chocolate, coconut, lard, palm oil, or palm kernel oil. Ice cream or ice cream drinks. Candy that contains chocolate, coconut, butter, hydrogenated fat, or unknown ingredients. Buttered syrups. Fats and Oils  Allowed: Vegetable oils: safflower, sunflower, corn, soybean, cottonseed, sesame, canola, olive, or peanut. Non-hydrogenated margarines. Salad dressing or mayonnaise: homemade or commercial, made with a recommended oil. Low or nonfat salad dressing or mayonnaise.  Limit added fats and oils to 6 to 8 tsp per day (includes fats used in cooking, baking, salads, and spreads on bread). Remember to count the "hidden fats" in foods.  Avoid: Solid fats and shortenings: butter, lard, salt pork, bacon drippings. Gravy containing meat fat, shortening, or suet. Cocoa butter, coconut. Coconut oil, palm oil, palm kernel oil, or hydrogenated oils: these ingredients are often used in bakery products, nondairy creamers, whipped toppings, candy, and commercially fried foods. Read labels carefully. Salad dressings made of unknown oils, sour cream, or cheese, such as blue cheese  and [place]ef[place]. Cream, all kinds: half-and-half, light, heavy, or whipping. Sour cream or cream cheese (even if "light" or low-fat). Nondairy cream substitutes: coffee creamers and sour cream substitutes made with palm, palm kernel, hydrogenated oils, or coconut oil. Beverages  Allowed: Coffee (regular or decaffeinated), tea. Diet carbonated beverages, mineral water. Alcohol: Check with your caregiver. Moderation is recommended.  Avoid: Whole milk, regular sodas, and juice drinks with added sugar. Condiments  Allowed: All seasonings and condiments. Cocoa powder. "Cream" sauces made with recommended ingredients.  Avoid: Carob powder made with hydrogenated fats. SAMPLE MENU Breakfast   cup orange juice   cup oatmeal  1 slice toast  1 tsp margarine  1 cup skim milk Lunch  [place] sandwich with 2 oz [place], 2 slices bread  Lettuce and tomato slices  Fresh fruit  Carrot sticks  Coffee or tea Snack  Fresh fruit or low-fat crackers Dinner  3 oz lean ground beef  1 baked potato  1 tsp margarine   cup asparagus  Lettuce salad  1 tbs non-creamy dressing   cup peach slices  1 cup skim milk Document Released: 09/13/2008 Document Revised: 06/05/2012 Document Reviewed: 02/28/2012 [employer] Patient Information 2015 [place], [place]. This information is not intended to replace advice given to you by your health care provider. Make sure you discuss any questions you have with your health care provider.

## 2014-07-07 NOTE — Progress Notes (Signed)
I met with pt and his girlfriend at bedside. We discussed inpt rehab venue admission. Both are in agreement to admit. I contacted Dr. Tana Coast and I will arrange admit for today. [phone]

## 2014-07-07 NOTE — Discharge Summary (Signed)
Physician Discharge Summary  Patient ID: Joshua Henson [facility] MRN: [id number] DOB/AGE: 27-Aug-1946 68 y.o.  Admit date: 07/01/2014 Discharge date: 07/07/2014  Primary Care Physician:  Adella Hare, MD  Discharge Diagnoses:    . Syncope and collapse . Alcohol abuse . Orthostatic hypotension . 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 Janann Colonel                    Inpatient rehabilitation   Recommendations for Outpatient Follow-up:  Please note that patient was taking significant amount of Ativan outpatient and hence I have transitioned him to [place]. I also strongly recommended him to see a psychiatrist, placed in AVS  Please continue Ceftin for total 2 weeks, started on 07/06/14  I also recommended him to see ENT outpatient for his chronic maxillary sinusitis causing significant vertigo   Allergies:   Allergies  Allergen Reactions  . Celebrex [Celecoxib] Rash     Discharge Medications:   Medication List    STOP taking these medications       LORazepam 1 MG tablet  Commonly known as:  ATIVAN      TAKE these medications       amLODipine 10 MG tablet  Commonly known as:  NORVASC  Take 10 mg by mouth daily.     aspirin 81 MG EC tablet  Take 1 tablet (81 mg total) by mouth daily.     atorvastatin 10 MG tablet  Commonly known as:  LIPITOR  Take 10 mg by mouth daily.     BRINTELLIX 10 MG Tabs  Generic drug:  Vortioxetine HBr  Take 10 mg by mouth daily.     cefUROXime 500 MG tablet  Commonly known as:  CEFTIN  Take 1 tablet (500 mg total) by mouth 2 (two) times daily with a meal. X 2 weeks     clonazePAM 1 MG tablet  Commonly known as:  KLONOPIN  Take 1 tablet (1 mg total) by mouth 2 (two) times daily.     fluticasone 50 MCG/ACT nasal spray  Commonly known as:  FLONASE  Place 1 spray into both nostrils daily.      folic acid 1 MG tablet  Commonly known as:  FOLVITE  Take 1 mg by mouth daily.     lisinopril-hydrochlorothiazide 20-12.5 MG per tablet  Commonly known as:  PRINZIDE,ZESTORETIC  Take 1 tablet by mouth daily.     loratadine 10 MG tablet  Commonly known as:  CLARITIN  Take 1 tablet (10 mg total) by mouth daily.     multivitamin with minerals Tabs tablet  Take 1 tablet by mouth daily.     oxyCODONE 5 MG immediate release tablet  Commonly known as:  Oxy IR/ROXICODONE  Take 1 tablet (5 mg total) by mouth every 4 (four) hours as needed for moderate pain.     tamsulosin 0.4 MG Caps capsule  Commonly known as:  FLOMAX  Take 0.4 mg by mouth daily.     thiamine 100 MG tablet  Take 1 tablet (100 mg total) by mouth daily.         Brief H and P: For complete details please refer to admission H and P, but in brief Joshua Henson is a 68 y.o. male with remote history of Prostate Cancer S/P Radiation Rx who presents to the ED after he got up to night to go  into his kitchen and he passed out and hit his head on the kitchen counter and suffered a laceration to his left eyebrow area. When he arrived at the ED, he was evaluated and found to have orthostatic hypotension with systolic blood pressures of that dropped from 140/60 to 60/40. He reports not eating or drinking well for the past 3 months, and drinking heavily. He reports having increased depression and drinking because of his depression. He reports losing 5 pounds unintentionally over the past 3 months. He reports that he drinks a pint daily usually, but this evening he only drank 3 shots. He had a similar episode 4 days ago when he passed out after feeling a heaviness in his head, he fell to the ground striking his chest on a chair. He was seen by his PCP and was sent for an outpatient chest X-ray of which the results were reviewed and discussed with him by the EDP which was negative for any rib fractures. When he had the syncopal event 4  days ago, he stated that it took him 90 seconds to feel completely back to normal   Hospital Course:  68 year old with history of prostate cancer status post radiation who presented to the ED after a syncopal episode of which he hit his head on the kitchen counter and suffered laceration to his left eyebrow. Reportedly patient had been drinking heavily prior to admission and on further evaluation was found to have orthostatic blood pressure changes. MRI evaluation of the head revealed new stroke.    Syncope and collapse : Multifactorial, Possibly due to dehydration,was orthostatic on admission, heavy alcohol abuse leading to gait instability, acute vertigo (right BPPV), CVA on MRI. The patient was admitted and started on IV fluid hydration which significantly improved his symptoms. Patient underwent MRI of the brain and 2-D echo (discussed below). PT evaluation recommended inpatient rehabilitation.    BPPV, right: With chronic sinusitis : MRI of the brain also showed polyps/mucous retention cyst in left maxillary sinus and chronic right maxillary sinusitis. Patient was placed on  which did improve his symptoms significantly. He was also placed on Claritin, Ceftin, Flonase. He may not need Ceftin at the time of discharge from CIR.  I recommended him to follow up with ENT after discharge from CIR.  Acute left frontal lobe punctate infarct/CVA with no localizing or lateralizing symptoms  - Neurology was consulted, MRI positive for acute left frontal lobe punctate infarct  -CT angiogram of the head and neck normal  - 2-D echo showed normal EF with no wall motion abnormalities. Carotid Dopplers showed no critical ICA stenosis  - Continue aspirin, lipid panel showed LDL of 46, cholesterol 118   Scalp laceration repaired in ED - CT head showed no fracture   heavy Alcohol abuse  Patient was placed on CIWA protocol with Ativan. He has completed Ativan protocol. Patient's girlfriend also reported that he  has been taking significant amount of Ativan 3 times a day at home, and concerned about functional dependency on Ativan. I have transitioned him to Klonopin 1mg  BID, he will need prescriptions at discharge from CIR. He would need to see a psychiatrist outpatient.   Day of Discharge BP 161/98  Pulse 96  Temp(Src) 98.4 F (36.9 C) (Oral)  Resp 19  Ht 5' 10.5" (1.791 m)  Wt 90.946 kg (200 lb 8 oz)  BMI 28.35 kg/m2  SpO2 95%  Physical Exam: General: Alert and awake oriented x3 not in any acute distress. CVS: S1-S2 clear  no murmur rubs or gallops Chest: clear to auscultation bilaterally, no wheezing rales or rhonchi Abdomen: soft nontender, nondistended, normal bowel sounds Extremities: no cyanosis, clubbing or edema noted bilaterally Neuro: Cranial nerves II-XII intact, no focal neurological deficits   The results of significant diagnostics from this hospitalization (including imaging, microbiology, ancillary and laboratory) are listed below for reference.    LAB RESULTS: Basic Metabolic Panel:  Recent Labs Lab 07/04/14 2358 07/06/14 0430  NA 136* 142  K 3.6* 3.3*  CL 99 103  CO2 20 23  GLUCOSE 118* 131*  BUN 7 10  CREATININE 0.55 0.55  CALCIUM 8.6 8.6  MG  --  1.5   Liver Function Tests:  Recent Labs Lab 07/04/14 1610 07/06/14 0430  AST 115* 89*  ALT 62* 66*  ALKPHOS 69 75  BILITOT 1.0 0.7  PROT 6.6 6.3  ALBUMIN 3.3* 3.1*   No results found for this basename: LIPASE, AMYLASE,  in the last 168 hours No results found for this basename: AMMONIA,  in the last 168 hours CBC:  Recent Labs Lab 07/05/14 0803 07/06/14 0430  WBC 4.9 4.2  HGB 13.2 12.8*  HCT 36.6* 36.2*  MCV 99.2 99.5  PLT 172 170   Cardiac Enzymes:  Recent Labs Lab 07/02/14 0816 07/02/14 1455  TROPONINI <0.30 <0.30   BNP: No components found with this basename: POCBNP,  CBG:  Recent Labs Lab 07/02/14 0004  GLUCAP 119*    Significant Diagnostic Studies:  Dg Chest 2  View  07/01/2014   CLINICAL DATA:  Syncopal F cell falling for do not image chest  EXAM: CHEST  2 VIEW  COMPARISON:  None.  FINDINGS: The right hemidiaphragm is higher than the left. There is atelectasis anteriorly and posteriorly at the left lung base. There is no pneumothorax or pleural effusion. The right lung is clear. The cardiac silhouette is top-normal in size. The pulmonary vascularity is normal. There is mild tortuosity of the descending thoracic aorta. The observed portions of the ribs exhibit no acute abnormalities. The thoracic vertebral bodies are preserved in height.  IMPRESSION: There is atelectasis at the left lung base. There is no pneumothorax nor acute rib fracture demonstrated.   Electronically Signed   By: David  Martinique   On: 07/01/2014 15:21   Ct Head Wo Contrast  07/02/2014   CLINICAL DATA:  Syncope and head injury  EXAM: CT HEAD WITHOUT CONTRAST  TECHNIQUE: Contiguous axial images were obtained from the base of the skull through the vertex without intravenous contrast.  COMPARISON:  01/07/2014 temporal bone CT  FINDINGS: Skull and Sinuses:There is a large left frontotemporal scalp laceration and contusion. No underlying fracture.  Probable mucous retention cyst in the posterior left maxillary sinus. An infraorbital air cell on the left may limit left maxillary sinus outflow. Chronic mucosal thickening and neoosteogenesis of the right maxillary sinus. These changes of chronic sinusitis are stable from previous.  Orbits: No acute abnormality.  Brain: No evidence of acute abnormality, such as acute infarction, hemorrhage, hydrocephalus, or mass lesion/mass effect. Generalized cerebral and cerebellar volume loss which is borderline age advanced. There is mild chronic small vessel disease with patchy deep and periventricular cerebral white matter low density.  IMPRESSION: 1. No acute intracranial injury. 2. Left frontotemporal scalp contusion.  No calvarial fracture. 3. Chronic findings are  noted above.   Electronically Signed   By: Jorje Guild M.D.   On: 07/02/2014 01:39    2D ECHO: Study Conclusions  - Left ventricle: The  cavity size was normal. Systolic function was normal. Wall motion was normal; there were no regional wall motion abnormalities. There was an increased relative contribution of atrial contraction to ventricular filling.     Disposition and Follow-up:    DISPOSITION:Inpatient rehabilitation   DIET: heart     DISCHARGE FOLLOW-UP     Follow-up Information   Follow up with Ruby Cola, MD. Schedule an appointment as soon as possible for a visit in 2 weeks. (for hospital follow-up after DC from rehab)    Specialty:  Otolaryngology   Contact information:   [address] [place] [address] [phone]       Follow up with Derrel Nip, MD. Schedule an appointment as soon as possible for a visit in 2 weeks. (for hospital follow-up after discharge from rehab)    Specialty:  Psychiatry   Contact information:   [address] [address] [phone]       Follow up with JAFFE, [place], DO On 08/12/2014. (for hospital follow-up at 1:30 PM )    Specialty:  Neurology   Contact information:   [place]  [address] [phone]       Time spent on Discharge: 45 mins  Signed:   RAI,RIPUDEEP M.D. [facility] 07/07/2014, 1:08 PM Pager: [phone]   **Disclaimer: This note was dictated with voice recognition software. Similar sounding words can inadvertently be transcribed and this note may contain transcription errors which may not have been corrected upon publication of note.**

## 2014-07-07 NOTE — H&P (Signed)
Physical Medicine and Rehabilitation Admission H&P  Chief Complaint   Patient presents with   .  Loss of Consciousness   :  HPI: Joshua Henson is a 68 y.o. right-handed male with history of prostate cancer status post radiation as well as alcohol abuse. 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 Alcohol level 155 upon admission. MRI of the brain shows punctate nonhemorrhagic infarct involving the anterior left frontal lobe, remote 3m left frontal lobe white matter infarct as well as bilateral maxillary sinus disease. Cardiac enzymes negative. Patient did not receive TPA.  Echocardiogram with normal systolic function no wall motion abnormalities. CT angiogram head and neck with atherosclerotic type changes no stenosis. Neurology services consulted placed on aspirin for CVA prophylaxis as well as subcutaneous Lovenox for DVT prophylaxis. Ceftin added for maxillary sinus disease identified on MRI. Patient is tolerating a regular consistency diet. Patient has been monitored for any alcohol withdrawal and has been transitioned from Ativan to [facility] Physical therapy evaluation completed an ongoing with recommendations for physical medicine rehabilitation consult. Patient was admitted for comprehensive rehabilitation program    ROS Review of Systems  Musculoskeletal: Positive for falls.  Neurological: Positive for dizziness.  Psychiatric/Behavioral:  Anxiety /depression  Genitourinary. Frequency  All other systems reviewed and are negative   Past Medical History   Diagnosis  Date   .  Prostate cancer  2010     External beam radiation (urol - [facility] XRT -[facility]    Past Surgical History   Procedure  Laterality  Date   .  None      Family History   Problem  Relation  Age of Onset   .  Cancer  Mother      esophageal   .   Hypertension  Mother    .  Diabetes  Mother    .  Cancer  Father      prostate   .  Heart disease  Maternal Grandfather      MI   .  Cancer  Paternal Grandfather      prostate    Social History: reports that he has been smoking Cigarettes. He has a 30 pack-year smoking history. He has never used smokeless tobacco. He reports that he drinks about 3.5 ounces of alcohol per week. He reports that he does not use illicit drugs.  Allergies:  Allergies   Allergen  Reactions   .  Celebrex [Celecoxib]  Rash    Medications Prior to Admission   Medication  Sig  Dispense  Refill   .  amLODipine (NORVASC) 10 MG tablet  Take 10 mg by mouth daily.     .Marland Kitchen atorvastatin (LIPITOR) 10 MG tablet  Take 10 mg by mouth daily.     .  folic acid (FOLVITE) 1 MG tablet  Take 1 mg by mouth daily.     .Marland Kitchen lisinopril-hydrochlorothiazide (PRINZIDE,ZESTORETIC) 20-12.5 MG per tablet  Take 1 tablet by mouth daily.     .Marland Kitchen LORazepam (ATIVAN) 1 MG tablet  Take 1 mg by mouth 3 (three) times daily.     .  tamsulosin (FLOMAX) 0.4 MG CAPS capsule  Take 0.4 mg by mouth daily.     .  Vortioxetine HBr (BRINTELLIX) 10 MG TABS  Take 10 mg by mouth daily.      Home:  Home Living  Family/patient expects to be discharged to:: Private residence  Living Arrangements: Spouse/significant other  Available Help at Discharge: Family;Available 24 hours/day  Type of Home: House  Home Access: Stairs to enter  [employer] of Steps: 2-3  Home Layout: One level  Home Equipment: None  Functional History:  Prior Function  Level of Independence: Independent  Functional Status:  Mobility:  Bed Mobility  Overal bed mobility: Modified Independent  General bed mobility comments: Moves to sitting with use of hand rail and increased time.  Transfers  Overall transfer level: Needs assistance  Equipment used: Rolling walker (2 wheeled)  Transfers: Sit to/from Stand  Sit to Stand: Mod assist  Stand pivot transfers: Mod assist    General transfer comment: Verbal cues for hand placement - continues to reach for RW to pull to standing. Assist to rise to standing and to maintain balance/safety.  Ambulation/Gait  Ambulation/Gait assistance: Mod assist  Ambulation Distance (Feet): 30 Feet  Assistive device: Rolling walker (2 wheeled)  Gait Pattern/deviations: Step-through pattern;Decreased stride length;Decreased weight shift to left;Ataxic;Staggering right;Trunk flexed  Gait velocity: Decreased  Gait velocity interpretation: Below normal speed for age/gender  General Gait Details: Patient with significant lean to right today. Attempted to work on shifting weight to left in standing. Then returns to right lean with ambulation. Required mod to max assist to prevent fall to right. Patient reports feeling lightheaded and "not right". Returned to sitting.   ADL:   Cognition:  Cognition  Overall Cognitive Status: Impaired/Different from baseline  Orientation Level: Oriented X4  Cognition  Arousal/Alertness: Awake/alert  Behavior During Therapy: Impulsive  Overall Cognitive Status: Impaired/Different from baseline  Area of Impairment: Following commands;Safety/judgement  Following Commands: Follows multi-step commands with increased time  Safety/Judgement: Decreased awareness of safety  General Comments: Less impulsive today  Physical Exam:  Blood pressure 165/95, pulse 98, temperature 98.4 F (36.9 C), temperature source Oral, resp. rate 20, height 5' 10.5" (1.791 m), weight 90.946 kg (200 lb 8 oz), SpO2 97.00%.  Physical Exam  Constitutional: He is oriented to person, place, and time.  HENT:  Healing laceration over left eyebrow  Eyes: EOM are normal.  Without nystagmus  Neck: Normal range of motion. Neck supple. No thyromegaly present.  Cardiovascular: Normal rate and regular rhythm.  Respiratory: Effort normal and breath sounds normal. No respiratory distress.  GI: Soft. Bowel sounds are normal. He exhibits no  distension. Stool smeared on inner thighs/ buttocks Neurological: He is alert and oriented to person, place, and time.  Follows full commands     5/5 bilateral hip flexors knee extensors ankle dorsiflexion plantar flexor  4/5 right deltoid, bicep, tricep, grip  5/5 left deltoid, bicep, tricep, grip  Decreased finger to thumb opposition right hand and finger to nose. Fair depth perception. - pronator drift.  Light touch is intact bilateral feet   Skin: scrotum red, slightly swollen. A few scattered bruises along arms. Left flank/ribs.  Musc: left lower rib cage sore to touch.  Psych:  Pleasant, cooperative, reasonabe insight and awareness.      Results for orders placed during the hospital encounter of 07/01/14 (from the past 48 hour(s))   COMPREHENSIVE METABOLIC PANEL Status: Abnormal    Collection Time    07/06/14 4:30 AM   Result  Value  Ref Range    Sodium  142  137 - 147 mEq/L    Potassium  3.3 (*)  3.7 - 5.3 mEq/L    Chloride  103  96 -  112 mEq/L    CO2  23  19 - 32 mEq/L    Glucose, Bld  131 (*)  70 - 99 mg/dL    BUN  10  6 - 23 mg/dL    Creatinine, Ser  0.55  0.50 - 1.35 mg/dL    Calcium  8.6  8.4 - 10.5 mg/dL    Total Protein  6.3  6.0 - 8.3 g/dL    Albumin  3.1 (*)  3.5 - 5.2 g/dL    AST  89 (*)  0 - 37 U/L    ALT  66 (*)  0 - 53 U/L    Alkaline Phosphatase  75  39 - 117 U/L    Total Bilirubin  0.7  0.3 - 1.2 mg/dL    GFR calc non Af Amer  >90  >90 mL/min    GFR calc Af Amer  >90  >90 mL/min    Comment:  (NOTE)     The eGFR has been calculated using the CKD EPI equation.     This calculation has not been validated in all clinical situations.     eGFR's persistently <90 mL/min signify possible Chronic Kidney     Disease.    Anion gap  16 (*)  5 - 15   CBC Status: Abnormal    Collection Time    07/06/14 4:30 AM   Result  Value  Ref Range    WBC  4.2  4.0 - 10.5 K/uL    RBC  3.64 (*)  4.22 - 5.81 MIL/uL    Hemoglobin  12.8 (*)  13.0 - 17.0 g/dL    HCT  36.2 (*)   39.0 - 52.0 %    MCV  99.5  78.0 - 100.0 fL    MCH  35.2 (*)  26.0 - 34.0 pg    MCHC  35.4  30.0 - 36.0 g/dL    Comment:  CORRECTED FOR COLD AGGLUTININS    RDW  13.9  11.5 - 15.5 %    Platelets  170  150 - 400 K/uL   MAGNESIUM Status: None    Collection Time    07/06/14 4:30 AM   Result  Value  Ref Range    Magnesium  1.5  1.5 - 2.5 mg/dL    No results found.  Medical Problem List and Plan:  1. Functional deficits secondary to thrombotic left frontal infarct  2. DVT Prophylaxis/Anticoagulation: Subcutaneous Lovenox. Monitor platelet counts any signs of bleeding  3. Pain Management: Oxycodone 5 mg every 4 as needed moderate pain. Monitor with increased mobility  4. Mood/anxiety/depression. Klonopin 1 mg twice a day . Provide emotional support: Patient on Brintellix 10 mg daily and may bring his own supply   -will consult neuropsych for depression and coping skills. He reports battling depression for some time 5. Neuropsych: This patient is capable of making decisions on his own behalf.  6. Hypertension. Prinzide 20-12.5 mg daily, Norvasc 10 mg daily. Monitor with increased mobility  7. History of alcohol abuse. Monitor for any signs of withdrawal. Provide counseling on floor 8. Hyperlipidemia. Lipitor  9. Bilateral maxillary sinus disease. Complete course of Ceftin initiated 07/06/2014 x2 weeks  10. History of prostate cancer. Continue Flomax.  Post Admission Physician Evaluation:  1. Functional deficits secondary to left frontal infarct. 2. Patient is admitted to receive collaborative, interdisciplinary care between the physiatrist, rehab nursing staff, and therapy team. 3. Patient's level of medical complexity and substantial therapy needs in context of  that medical necessity cannot be provided at a lesser intensity of care such as a SNF. 4. Patient has experienced substantial functional loss from his/her baseline which was documented above under the "Functional History" and  "Functional Status" headings. Judging by the patient's diagnosis, physical exam, and functional history, the patient has potential for functional progress which will result in measurable gains while on inpatient rehab. These gains will be of substantial and practical use upon discharge in facilitating mobility and self-care at the household level. 5. Physiatrist will provide 24 hour management of medical needs as well as oversight of the therapy plan/treatment and provide guidance as appropriate regarding the interaction of the two. 6. 24 hour rehab nursing will assist with bladder management, bowel management, safety, skin/wound care, disease management, medication administration, pain management and patient education and help integrate therapy concepts, techniques,education, etc. 7. PT will assess and treat for/with: Lower extremity strength, range of motion, stamina, balance, functional mobility, safety, adaptive techniques and equipment, NMR, coordination, balance, stroke education. Goals are: mod I. 8. OT will assess and treat for/with: ADL's, functional mobility, safety, upper extremity strength, adaptive techniques and equipment, NMR, fine motor coordination, stroke education, egosupport, community reintegration. Goals are: mod I. 9. SLP will assess and treat for/with: cognition, communication. Goals are: mod I. 10. Case Management and Social Worker will assess and treat for psychological issues and discharge planning. 11. Team conference will be held weekly to assess progress toward goals and to determine barriers to discharge. 12. Patient will receive at least 3 hours of therapy per day at least 5 days per week. 13. ELOS: 10-14 days  14. Prognosis: excellent  Meredith Staggers, MD, [facility]   07/07/2014

## 2014-07-07 NOTE — Consult Note (Signed)
Physical Medicine and Rehabilitation Consult Reason for Consult: CVA Referring Physician: [facility]   HPI: Joshua Henson is a 68 y.o. right-handed male with history of prostate cancer status post radiation as well as alcohol abuse. 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 Alcohol level 155 upon admission. MRI of the brain shows punctate nonhemorrhagic infarct involving the anterior left frontal lobe, remote 102mm left frontal lobe white matter infarct. Cardiac enzymes negative. Patient did not receive TPA. Echocardiogram with normal systolic function no wall motion abnormalities. CT angiogram head and neck with atherosclerotic type changes no stenosis. Neurology services consulted placed on aspirin for CVA prophylaxis as well as subcutaneous Lovenox for DVT prophylaxis. Patient is tolerating a regular consistency diet. Physical therapy evaluation completed an ongoing with recommendations for physical medicine rehabilitation consult.  Patient gives history of middle ear fluid.  Has seen ENT at [facility] of frequency and dysuria  Review of Systems  Musculoskeletal: Positive for falls.  Neurological: Positive for dizziness.  Psychiatric/Behavioral:       Anxiety  All other systems reviewed and are negative.  Past Medical History  Diagnosis Date  . Prostate cancer 2010    External beam radiation (urol - Risa Grill, XRT Valere Dross)   Past Surgical History  Procedure Laterality Date  . None     Family History  Problem Relation Age of Onset  . Cancer Mother     esophageal  . Hypertension Mother   . Diabetes Mother   . Cancer Father     prostate  . Heart disease Maternal Grandfather     MI  . Cancer Paternal Grandfather     prostate   Social History:  reports that he has been smoking Cigarettes.  He has a 30  pack-year smoking history. He has never used smokeless tobacco. He reports that he drinks about 3.5 ounces of alcohol per week. He reports that he does not use illicit drugs. Allergies:  Allergies  Allergen Reactions  . Celebrex [Celecoxib] Rash   Medications Prior to Admission  Medication Sig Dispense Refill  . amLODipine (NORVASC) 10 MG tablet Take 10 mg by mouth daily.      Marland Kitchen atorvastatin (LIPITOR) 10 MG tablet Take 10 mg by mouth daily.      . folic acid (FOLVITE) 1 MG tablet Take 1 mg by mouth daily.      Marland Kitchen lisinopril-hydrochlorothiazide (PRINZIDE,ZESTORETIC) 20-12.5 MG per tablet Take 1 tablet by mouth daily.      Marland Kitchen LORazepam (ATIVAN) 1 MG tablet Take 1 mg by mouth 3 (three) times daily.      . tamsulosin (FLOMAX) 0.4 MG CAPS capsule Take 0.4 mg by mouth daily.      . Vortioxetine HBr (BRINTELLIX) 10 MG TABS Take 10 mg by mouth daily.        Home: Home Living Family/patient expects to be discharged to:: Private residence Living Arrangements: Spouse/significant other Available Help at Discharge: Family;Available 24 hours/day Type of Home: House Home Access: Stairs to enter [employer] of Steps: 2-3 Home Layout: One level Home Equipment: None  Functional History: Prior Function Level of Independence: Independent Functional Status:  Mobility: Bed Mobility Overal bed mobility: Modified Independent General bed mobility comments: Moves to sitting with use of hand rail and increased time. Transfers Overall transfer level: Needs assistance Equipment used: Rolling walker (  2 wheeled) Transfers: Sit to/from Stand Sit to Stand: Mod assist Stand pivot transfers: Mod assist General transfer comment: Verbal cues for hand placement - continues to reach for RW to pull to standing.  Assist to rise to standing and to maintain balance/safety. Ambulation/Gait Ambulation/Gait assistance: Mod assist Ambulation Distance (Feet): 30 Feet Assistive device: Rolling walker (2  wheeled) Gait Pattern/deviations: Step-through pattern;Decreased stride length;Decreased weight shift to left;Ataxic;Staggering right;Trunk flexed Gait velocity: Decreased Gait velocity interpretation: Below normal speed for age/gender General Gait Details: Patient with significant lean to right today.  Attempted to work on shifting weight to left in standing.  Then returns to right lean with ambulation.  Required mod to max assist to prevent fall to right.  Patient reports feeling lightheaded and "not right".  Returned to sitting.    ADL:    Cognition: Cognition Overall Cognitive Status: Impaired/Different from baseline Orientation Level: Oriented X4 Cognition Arousal/Alertness: Awake/alert Behavior During Therapy: Impulsive Overall Cognitive Status: Impaired/Different from baseline Area of Impairment: Following commands;Safety/judgement Following Commands: Follows multi-step commands with increased time Safety/Judgement: Decreased awareness of safety General Comments: Less impulsive today  Blood pressure 148/88, pulse 81, temperature 98.2 F (36.8 C), temperature source Oral, resp. rate 18, height 5' 10.5" (1.791 m), weight 91.173 kg (201 lb), SpO2 95.00%. Physical Exam  Vitals reviewed. Constitutional: He is oriented to person, place, and time.  HENT:  Healing laceration over left eyebrow  Eyes: EOM are normal.  Without nystagmus  Neck: Normal range of motion. Neck supple. No thyromegaly present.  Cardiovascular: Normal rate and regular rhythm.   Respiratory: Effort normal and breath sounds normal. No respiratory distress.  GI: Soft. Bowel sounds are normal. He exhibits no distension.  Neurological: He is alert and oriented to person, place, and time.  Follows full commands  Skin: Skin is warm and dry.  5/5 bilateral hip flexors knee extensors ankle dorsiflexion plantar flexor 4/5 right deltoid, bicep, tricep, grip 5/5 left deltoid, bicep, tricep, grip Decreased finger to  thumb opposition right hand. Light touch is intact bilateral feet No results found for this or any previous visit (from the past 24 hour(s)). No results found.  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 Does the need for close, 24 hr/day medical supervision in concert with the patient's rehab needs make it unreasonable for this patient to be served in a less intensive setting? Yes 2. Co-Morbidities requiring supervision/potential complications: History of prostate carcinoma probable UTI, and middle ear dysfunction vestibular disorder, alcohol abuse and orthostatic hypotension 3. Due to bladder management, bowel management, safety, skin/wound care, disease management, medication administration, pain management and patient education, does the patient require 24 hr/day rehab nursing? Yes 4. Does the patient require coordinated care of a physician, rehab nurse, PT (1-2 hrs/day, 5 days/week), OT (1-2 hrs/day, 5 days/week) and SLP (.5-1 hrs/day, 5 days/week) to address physical and functional deficits in the context of the above medical diagnosis(es)? Yes Addressing deficits in the following areas: balance, endurance, locomotion, strength, transferring, bowel/bladder control, bathing, dressing, feeding, grooming, toileting and cognition 5. Can the patient actively participate in an intensive therapy program of at least 3 hrs of therapy per day at least 5 days per week? Yes 6. The potential for patient to make measurable gains while on inpatient rehab is excellent 7. Anticipated functional outcomes upon discharge from inpatient rehab are modified independent and supervision  with PT, modified independent and supervision with OT, supervision with SLP. 8. Estimated rehab length of  stay to reach the above functional goals is: 7-10d 9. Does the patient have adequate social supports to accommodate these discharge functional goals? Yes 10. Anticipated  D/C setting: Home 11. Anticipated post D/C treatments: Outpatient therapy 12. Overall Rehab/Functional Prognosis: excellent  RECOMMENDATIONS: This patient's condition is appropriate for continued rehabilitative care in the following setting: CIR Patient has agreed to participate in recommended program. Yes Note that insurance prior authorization may be required for reimbursement for recommended care.  Comment:     07/07/2014

## 2014-07-07 NOTE — PMR Pre-admission (Signed)
PMR Admission Coordinator Pre-Admission Assessment  Patient: Joshua Henson is an 68 y.o., male MRN: [id number] DOB: March 07, 1946 Height: 5' 10.5" (179.1 cm) Weight: 90.946 kg (200 lb 8 oz)              Insurance Information HMO:     PPO:      PCP:      IPA:      80/20: yes     OTHER: no HMO PRIMARY: Medicare a and b      Policy#: [id number]      Subscriber: pt Benefits:  Phone #: [email or web address]     Name: 07/07/14 Eff. Date: 10/19/2012     Deduct: $1260      Out of Pocket Max: none      Life Max: none CIR: 100%      SNF: 20 full days Outpatient: 80%     Co-Pay: 20% Home Health: 100%      Co-Pay: none DME: 80%     Co-Pay: 20% Providers: pt choice  SECONDARY: BCBS of [place] supplement      Policy#: [id number]      Subscriber: pt  Medicaid Application Date:       Case Manager:  Disability Application Date:       Case Worker:   Emergency Contact Information Contact Information   Name Relation Home Work Mobile   Tilghman,Michelle Significant other [phone]       Current Medical History  Patient Admitting Diagnosis: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 right-handed male with history of prostate cancer status post radiation as well as alcohol abuse. 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 Alcohol level 155 upon admission. MRI of the brain shows punctate nonhemorrhagic infarct involving the anterior left frontal lobe, remote 61mm left frontal lobe white matter infarct. Cardiac enzymes negative. Patient did not receive TPA.  Echocardiogram with normal systolic function no wall motion abnormalities. CT angiogram head and neck with atherosclerotic type changes no stenosis. Neurology services consulted placed on aspirin for CVA  prophylaxis as well as subcutaneous Lovenox for DVT prophylaxis. Patient is tolerating a regular consistency diet. Patient gives history of middle ear fluid. Has seen ENT at [facility].  Total: 0 NIH    Past Medical History  Past Medical History  Diagnosis Date  . Prostate cancer 2010    External beam radiation (urol - [place], XRT Valere Dross)    Family History  family history includes Cancer in his father, mother, and paternal grandfather; Diabetes in his mother; Heart disease in his maternal grandfather; Hypertension in his mother.  Prior Rehab/Hospitalizations: none   Current Medications  Current facility-administered medications:acetaminophen (TYLENOL) suppository 650 mg, 650 mg, Rectal, Q6H PRN, Theressa Millard, MD;  acetaminophen (TYLENOL) tablet 650 mg, 650 mg, Oral, Q6H PRN, Theressa Millard, MD;  alum & mag hydroxide-simeth (MAALOX/MYLANTA) 200-200-20 MG/5ML suspension 30 mL, 30 mL, Oral, Q6H PRN, Theressa Millard, MD;  aspirin EC tablet 81 mg, 81 mg, Oral, Daily, Marliss Coots, PA-C, 81 mg at 07/07/14 0956 atorvastatin (LIPITOR) tablet 10 mg, 10 mg, Oral, q1800, Hulen Luster, DO, 10 mg at 07/06/14 1701;  cefUROXime (CEFTIN) tablet 500 mg, 500 mg, Oral, BID WC, Ripudeep K Rai, MD, 500 mg at 07/07/14 0855;  enoxaparin (LOVENOX)  injection 40 mg, 40 mg, Subcutaneous, Q24H, Harvette C Jenkins, MD, 40 mg at 07/07/14 1001 feeding supplement (ENSURE) (ENSURE) pudding 1 Container, 1 Container, Oral, BID BM, Velvet Bathe, MD, 1 Container at 07/07/14 0957;  fluticasone (FLONASE) 50 MCG/ACT nasal spray 1 spray, 1 spray, Each Nare, Daily, Ripudeep K Rai, MD, 1 spray at 96/22/29 7989;  folic acid (FOLVITE) tablet 1 mg, 1 mg, Oral, Daily, Theressa Millard, MD, 1 mg at 07/07/14 0956 loratadine (CLARITIN) tablet 10 mg, 10 mg, Oral, Daily, Ripudeep K Rai, MD, 10 mg at 07/07/14 0956;  LORazepam (ATIVAN) tablet 1 mg, 1 mg, Oral, Q8H PRN, Ripudeep K Rai, MD, 1 mg at 07/07/14 2119;  multivitamin  with minerals tablet 1 tablet, 1 tablet, Oral, Daily, Theressa Millard, MD, 1 tablet at 07/07/14 0956;  ondansetron (ZOFRAN) injection 4 mg, 4 mg, Intravenous, Q6H PRN, Theressa Millard, MD ondansetron (ZOFRAN) tablet 4 mg, 4 mg, Oral, Q6H PRN, Theressa Millard, MD;  oxyCODONE (Oxy IR/ROXICODONE) immediate release tablet 5 mg, 5 mg, Oral, Q4H PRN, Theressa Millard, MD;  thiamine (VITAMIN B-1) tablet 100 mg, 100 mg, Oral, Daily, Theressa Millard, MD, 100 mg at 07/07/14 4174  Patients Current Diet: Cardiac  Precautions / Restrictions Precautions Precautions: Fall Precaution Comments: multiple syncopal episodes  Restrictions Weight Bearing Restrictions: No   Prior Activity Level Limited Community (1-2x/wk): pt's lifestyle has changed over last 3 months due to vertigo issues at home. 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 drives self. Vetigo issues since 10/2013  Current Functional Level Cognition  Overall Cognitive Status: Impaired/Different from baseline Orientation Level: Oriented X4 Following Commands: Follows multi-step commands with increased time Safety/Judgement: Decreased awareness of safety General Comments: Anxious and impulsive today.  Continues to need cuing for safe hand placement with sit <> stand.    Extremity Assessment (includes Sensation/Coordination)          ADLs       Mobility  Overal bed mobility: Modified Independent General bed mobility comments: Moves to sitting with use of hand rail and increased time.    Transfers  Overall transfer level: Needs assistance Equipment used: Rolling walker (2 wheeled) Transfers: Sit to/from Stand Sit to Stand: Mod assist Stand  pivot transfers: Mod assist General transfer comment: Needed repeated cuing for hand placement to stand.  Assist to rise to standing and for balance.  Worked in standing on midline posture and balance.      Ambulation / Gait / Stairs / Wheelchair Mobility  Ambulation/Gait Ambulation/Gait assistance: Mod assist Ambulation Distance (Feet): 60 Feet Assistive device: Rolling walker (2 wheeled) Gait Pattern/deviations: Step-through pattern;Decreased stride length;Ataxic;Staggering left;Staggering right;Trunk flexed Gait velocity: Decreased Gait velocity interpretation: Below normal speed for age/gender General Gait Details: Patient continues to require mod assist due to decreased balance/loss of balance during gait, especially during turns.  Continued to have patient focus on target during gait.    Posture / Balance Dynamic Sitting Balance Sitting balance - Comments: required UE support; tolerated sitting EOB total ~5 min    Special needs/care consideration  Skin l scalp laceration  Bowel mgmt: continent Bladder mgmt: foley Pt drinks 1 pint vodka per day. Has recently tried to cut back with help of his PCP who placed him on ativan 1 mg tid. Pt states numerous stressors with work and family. Sleeps only 4 to 5 hrs per night and self medicates to relax per pt. HTN meds- on meds pta not restarted in hospital Pt admits to depression. Has sought counseling in the past and open to recommendations from CIR team. Does not want to see Dr. Letta Moynahan for she is MD for his exwife.   Previous Home Environment Living Arrangements: Spouse/significant other;Other (Comment) [name] and pt together for 6 years)  Lives With: Significant other;Other (Comment) [name]) Available Help at Discharge: Friend(s);Other (Comment) [name] can work from home to provide 24/7; Cabin crew) Type of Home: House Home Layout: Two level Home Access: Stairs to enter Technical brewer  of Steps: 2-3 Bathroom Shower/Tub: Multimedia programmer: Standard Bathroom Accessibility: Yes How Accessible: Accessible via walker [place]: No  Discharge Living Setting Plans for Discharge Living Setting: Patient's home;Lives with (comment);Other (Comment) ([place], girlfriend) Type of Home at Discharge: House Discharge Home Layout: Two level;Able to live on main level with bedroom/bathroom;Full bath on main level Discharge Home Access: Stairs to enter Entrance Stairs-Number of Steps: 2 to 3 at all 3 entrances Discharge Bathroom Shower/Tub: Walk-in shower Discharge Bathroom Toilet: Standard Discharge Bathroom Accessibility: Yes How Accessible: Accessible via walker Does the patient have any problems obtaining your medications?: No  Social/Family/Support Systems Patient Roles: Partner;Parent;Other (Comment) (self employed Engineer, 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 Daughter 75, twin sons 52 yo. He does not want them to visit initially and he has told children and law partner very little of what he is admitted for. [name] feels community feels he is admitted for alcohol problems for pt not disclosing he has had a CVA. [place] very involved and I had to clarify with pt that he wants information shared with [name] as well as himself. [name] and pt have a lot of anxiety and need a lot of direction towards what can be managed inpt rehab vs outpt follow up with  PCP.  Goals/Additional Needs Patient/Family Goal for Rehab: Mod I to supervision with PT, OT, and SLP Expected length of stay: ELOS 7 to 10 days Special Service Needs: bed and chair alarms needed for decreased safety Pt/Family Agrees to Admission and willing to participate: Yes Program Orientation Provided & Reviewed with Pt/Caregiver Including Roles  & Responsibilities: Yes  Decrease burden of Care through IP rehab admission: n/a  Possible need for SNF placement upon discharge:not anticipated  Patient Condition: This patient's condition remains as documented in the consult dated 07/07/2014, in which the Rehabilitation Physician determined and documented that the patient's condition is appropriate for intensive rehabilitative care in an inpatient rehabilitation facility. Will admit to inpatient rehab today.  Preadmission Screen Completed By:  Cleatrice Burke, 07/07/2014 12:59 PM ______________________________________________________________________   Discussed status with Dr. Naaman Plummer on 07/07/2014 at 1258 and received telephone approval for admission today.  Admission Coordinator:  Cleatrice Burke, time 8101 Date 07/07/2014.

## 2014-07-07 NOTE — Progress Notes (Signed)
Physical Therapy Treatment Patient Details Name: Joshua Henson MRN: [id number] DOB: 05/07/1946 Today's Date: 07/07/2014    History of Present Illness 68 y.o. male with remote history of Prostate Cancer S/P Radiation Rx who presents to the ED after he got up to night to go into his kitchen and he passed out and hit his head on the kitchen counter and suffered a laceration to his left eyebrow area.  When he arrived at the ED, he was evaluated and found to have orthostatic hypotension with systolic blood pressures of that dropped from 140/60 to 60/40.  Pt with positive left frontal CVA.  Also positive for right BPPV.  Suspect hypofunction as well and will assess when BPPV is treated completely.      PT Comments    Patient making improvement with mobility and gait.  Continues to be limited by dizziness and imbalance. Will benefit from CIR stay prior to discharge.  Follow Up Recommendations  CIR     Equipment Recommendations  Rolling walker with 5" wheels    Recommendations for Other Services Rehab consult     Precautions / Restrictions Precautions Precautions: Fall Restrictions Weight Bearing Restrictions: No    Mobility  Bed Mobility Overal bed mobility: Modified Independent             General bed mobility comments: Moves to sitting with use of hand rail and increased time.  Transfers Overall transfer level: Needs assistance Equipment used: Rolling walker (2 wheeled) Transfers: Sit to/from Stand Sit to Stand: Mod assist         General transfer comment: Needed repeated cuing for hand placement to stand.  Assist to rise to standing and for balance.  Worked in standing on midline posture and balance.    Ambulation/Gait Ambulation/Gait assistance: Mod assist Ambulation Distance (Feet): 60 Feet Assistive device: Rolling walker (2 wheeled) Gait Pattern/deviations: Step-through pattern;Decreased stride length;Ataxic;Staggering left;Staggering right;Trunk flexed Gait  velocity: Decreased Gait velocity interpretation: Below normal speed for age/gender General Gait Details: Patient continues to require mod assist due to decreased balance/loss of balance during gait, especially during turns.  Continued to have patient focus on target during gait.   Stairs            Wheelchair Mobility    Modified Rankin (Stroke Patients Only) Modified Rankin (Stroke Patients Only) Pre-Morbid Rankin Score: No symptoms Modified Rankin: Moderately severe disability     Balance Overall balance assessment: Needs assistance Sitting-balance support: No upper extremity supported;Feet supported Sitting balance-Leahy Scale: Fair     Standing balance support: Bilateral upper extremity supported;During functional activity Standing balance-Leahy Scale: Poor Standing balance comment: Requires mod assist due to loss of balance.                    Cognition Arousal/Alertness: Awake/alert Behavior During Therapy: Impulsive Overall Cognitive Status: Impaired/Different from baseline Area of Impairment: Following commands;Safety/judgement       Following Commands: Follows multi-step commands with increased time Safety/Judgement: Decreased awareness of safety     General Comments: Anxious and impulsive today.  Continues to need cuing for safe hand placement with sit <> stand.    Vestibular Treatment: Performed x1 exercises - needed verbal instruction to perform correctly.                    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 drives self. Vetigo issues since 10/2013   PT Goals (current goals can now be found in the care plan section) Progress towards PT goals: Progressing toward goals    Frequency  Min 3X/week    PT Plan Current plan remains appropriate    Co-evaluation             End of Session Equipment Utilized During Treatment: Gait belt Activity Tolerance: Patient limited by fatigue Patient left: in chair;with call bell/phone within reach;with chair alarm set     Time: 6314-9702 PT Time Calculation (min): 23 min  Charges:  $Gait Training: 8-22 mins $Therapeutic Activity: 8-22 mins                    G Codes:      Despina Pole 07/25/2014, 12:59 PM Carita Pian. Sanjuana Kava, [place] Pager [phone]

## 2014-07-07 NOTE — Care Management (Signed)
07-07-14 1208 Case Manager did cancel the Outpatient Rehab referral for PT. CM spoke to Ang[facility] at [facility]. Pt will be accepted to CIR today. No further needs at this time. Bethena Roys, RN,BSN [phone]

## 2014-07-07 NOTE — Progress Notes (Signed)
Discharge instructions given and reviewed with patient.  Patient will be discharged to [facility] Cigarette smoking nearly doubles your risk of having a stroke & is the single most alterable risk factor  If you smoke or have smoked in the last 12 months, you are advised to quit smoking for your health.  Most of the excess cardiovascular risk related to smoking disappears within a year of stopping.  Ask you doctor about anti-smoking medications  [place] Quit Line: 1-800-QUIT NOW  Free Smoking Cessation Classes [phone]  CHOLESTEROL Know your levels; limit fat & cholesterol in your diet  Lipid Panel     Component Value Date/Time   CHOL 118 07/04/2014 2358   TRIG 77 07/04/2014 2358   HDL 57 07/04/2014 2358   CHOLHDL 2.1 07/04/2014 2358   VLDL 15 07/04/2014 2358   LDLCALC 46 07/04/2014 2358      Many patients benefit from treatment even if their cholesterol is at goal.  Goal: Total Cholesterol (CHOL) less than 160  Goal:  Triglycerides (TRIG) less than 150  Goal:  HDL greater than 40  Goal:  LDL (LDLCALC) less than 100   BLOOD PRESSURE American Stroke Association blood pressure target is less that 120/80 mm/Hg  Your discharge blood pressure is:  BP: 161/98 mmHg  Monitor your blood pressure  Limit your salt and alcohol intake  Many individuals will require more than one medication for high blood pressure  DIABETES (A1c is a blood sugar average for last 3 months) Goal HGBA1c is under 7% (HBGA1c is blood sugar average for last 3 months)  Diabetes: No known diagnosis of diabetes    Lab Results  Component Value Date   HGBA1C 5.6 07/04/2014     Your HGBA1c can be lowered with medications, healthy diet, and exercise.  Check your blood sugar as directed by your physician  Call your physician if you experience unexplained or low blood sugars.  PHYSICAL ACTIVITY/REHABILITATION Goal is 30 minutes at least 4 days per week  Activity: Increase  activity slowly, Therapies: INPATIENT REHAB Return to work: N/A  Activity decreases your risk of heart attack and stroke and makes your heart stronger.  It helps control your weight and blood pressure; helps you relax and can improve your mood.  Participate in a regular exercise program.  Talk with your doctor about the best form of exercise for you (dancing, walking, swimming, cycling).  DIET/WEIGHT Goal is to maintain a healthy weight  Your discharge diet is: Cardiac THINS liquids Your height is:  Height: 5' 10.5" (179.1 cm) Your current weight is: Weight: 90.946 kg (200 lb 8 oz) Your Body Mass Index (BMI) is:  BMI (Calculated): 29.1  Following the type of diet specifically designed for you will help prevent another stroke.  Your goal weight range is:    133  -  167  Your goal Body Mass Index (BMI) is 19-24.  Healthy food habits can help reduce 3 risk factors for stroke:  High cholesterol, hypertension, and excess weight.  RESOURCES Stroke/Support Group:  Call [phone]   STROKE EDUCATION PROVIDED/REVIEWED AND GIVEN TO PATIENT Stroke warning signs and symptoms How to activate emergency medical system (call 911). Medications prescribed at discharge. Need for follow-up after discharge. Personal risk factors for stroke. Pneumonia vaccine given: No Flu vaccine given: No My questions have been answered, the writing is legible, and I understand these instructions.  I will adhere to these goals & educational materials that have been provided to  me after my discharge from the hospital.     Stroke Prevention Some medical conditions and behaviors are associated with an increased chance of having a stroke. You may prevent a stroke by making healthy choices and managing medical conditions. HOW CAN I REDUCE MY RISK OF HAVING A STROKE?   Stay physically active. Get at least 30 minutes of activity on most or all days.  Do not smoke. It may also be helpful to avoid exposure to secondhand  smoke.  Limit alcohol use. Moderate alcohol use is considered to be:  No more than 2 drinks per day for men.  No more than 1 drink per day for nonpregnant women.  Eat healthy foods. This involves  Eating 5 or more servings of fruits and vegetables a day.  Following a diet that addresses high blood pressure (hypertension), high cholesterol, diabetes, or obesity.  Manage your cholesterol levels.  A diet low in saturated fat, trans fat, and cholesterol and high in fiber may control cholesterol levels.  Take any prescribed medicines to control cholesterol as directed by your health care provider.  Manage your diabetes.  A controlled-carbohydrate, controlled-sugar diet is recommended to manage diabetes.  Take any prescribed medicines to control diabetes as directed by your health care provider.  Control your hypertension.  A low-salt (sodium), low-saturated fat, low-trans fat, and low-cholesterol diet is recommended to manage hypertension.  Take any prescribed medicines to control hypertension as directed by your health care provider.  Maintain a healthy weight.  A reduced-calorie, low-sodium, low-saturated fat, low-trans fat, low-cholesterol diet is recommended to manage weight.  Stop drug abuse.  Avoid taking birth control pills.  Talk to your health care provider about the risks of taking birth control pills if you are over 69 years old, smoke, get migraines, or have ever had a blood clot.  Get evaluated for sleep disorders (sleep apnea).  Talk to your health care provider about getting a sleep evaluation if you snore a lot or have excessive sleepiness.  Take medicines as directed by your health care provider.  For some people, aspirin or blood thinners (anticoagulants) are helpful in reducing the risk of forming abnormal blood clots that can lead to stroke. If you have the irregular heart rhythm of atrial fibrillation, you should be on a blood thinner unless there is a  good reason you cannot take them.  Understand all your medicine instructions.  Make sure that other other conditions (such as anemia or atherosclerosis) are addressed. SEEK IMMEDIATE MEDICAL CARE IF:   You have sudden weakness or numbness of the face, arm, or leg, especially on one side of the body.  Your face or eyelid droops to one side.  You have sudden confusion.  You have trouble speaking (aphasia) or understanding.  You have sudden trouble seeing in one or both eyes.  You have sudden trouble walking.  You have dizziness.  You have a loss of balance or coordination.  You have a sudden, severe headache with no known cause.  You have new chest pain or an irregular heartbeat. Any of these symptoms may represent a serious problem that is an emergency. Do not wait to see if the symptoms will go away. Get medical help at once. Call your local emergency services  (911 in [place] Do not drive yourself to the hospital. Document Released: 01/12/2005 Document Revised: 09/25/2013 Document Reviewed: 06/07/2013 [facility] Patient Information 2015 [place], [place]. This information is not intended to replace advice given to you by your health care  provider. Make sure you discuss any questions you have with your health care provider.   Cardiac Diet This diet can help prevent heart disease and stroke. Many factors influence your heart health, including eating and exercise habits. Coronary risk rises a lot with abnormal blood fat (lipid) levels. Cardiac meal planning includes limiting unhealthy fats, increasing healthy fats, and making other small dietary changes. General guidelines are as follows:  Adjust calorie intake to reach and maintain desirable body weight.  Limit total fat intake to less than 30% of total calories. Saturated fat should be less than 7% of calories.  Saturated fats are found in animal products and in some vegetable products. Saturated vegetable fats are found in coconut  oil, cocoa butter, palm oil, and palm kernel oil. Read labels carefully to avoid these products as much as possible. Use butter in moderation. Choose tub margarines and oils that have 2 grams of fat or less. Good cooking oils are canola and olive oils.  Practice low-fat cooking techniques. Do not fry food. Instead, broil, bake, boil, steam, grill, roast on a rack, stir-fry, or microwave it. Other fat reducing suggestions include:  Remove the skin from poultry.  Remove all visible fat from meats.  Skim the fat off stews, soups, and gravies before serving them.  Steam vegetables in water or broth instead of sauting them in fat.  Avoid foods with trans fat (or hydrogenated oils), such as commercially fried foods and commercially baked goods. Commercial shortening and deep-frying fats will contain trans fat.  Increase intake of fruits, vegetables, whole grains, and legumes to replace foods high in fat.  Increase consumption of nuts, legumes, and seeds to at least 4 servings weekly. One serving of a legume equals  cup, and 1 serving of nuts or seeds equals  cup.  Choose whole grains more often. Have 3 servings per day (a serving is 1 ounce [oz]).  Eat 4 to 5 servings of vegetables per day. A serving of vegetables is 1 cup of raw leafy vegetables;  cup of raw or cooked cut-up vegetables;  cup of vegetable juice.  Eat 4 to 5 servings of fruit per day. A serving of fruit is 1 medium whole fruit;  cup of dried fruit;  cup of fresh, frozen, or canned fruit;  cup of 100% fruit juice.  Increase your intake of dietary fiber to 20 to 30 grams per day. Insoluble fiber may help lower your risk of heart disease and may help curb your appetite.  Soluble fiber binds cholesterol to be removed from the blood. Foods high in soluble fiber are dried beans, citrus fruits, oats, apples, bananas, broccoli, Brussels sprouts, and eggplant.  Try to include foods fortified with plant sterols or stanols, such as  yogurt, breads, juices, or margarines. Choose several fortified foods to achieve a daily intake of 2 to 3 grams of plant sterols or stanols.  Foods with omega-3 fats can help reduce your risk of heart disease. Aim to have a 3.5 oz portion of fatty fish twice per week, such as salmon, mackerel, albacore tuna, sardines, lake trout, or herring. If you wish to take a fish oil supplement, choose one that contains 1 gram of both DHA and EPA.  Limit processed meats to 2 servings (3 oz portion) weekly.  Limit the sodium in your diet to 1500 milligrams (mg) per day. If you have high blood pressure, talk to a registered dietitian about a DASH (Dietary Approaches to Stop Hypertension) eating plan.  Limit sweets  and beverages with added sugar, such as soda, to no more than 5 servings per week. One serving is:   1 tablespoon sugar.  1 tablespoon jelly or jam.   cup sorbet.  1 cup lemonade.   cup regular soda. CHOOSING FOODS Starches  Allowed: Breads: All kinds (wheat, rye, raisin, white, oatmeal, [place], [place], and English muffin bread). Low-fat rolls: English muffins, frankfurter and hamburger buns, bagels, pita bread, tortillas (not fried). Pancakes, waffles, biscuits, and muffins made with recommended oil.  Avoid: Products made with saturated or trans fats, oils, or whole milk products. Butter rolls, cheese breads, croissants. Commercial doughnuts, muffins, sweet rolls, biscuits, waffles, pancakes, store-bought mixes. Crackers  Allowed: Low-fat crackers and snacks: Animal, graham, rye, saltine (with recommended oil, no lard), oyster, and matzo crackers. Bread sticks, melba toast, rusks, flatbread, pretzels, and light popcorn.  Avoid: High-fat crackers: cheese crackers, butter crackers, and those made with coconut, palm oil, or trans fat (hydrogenated oils). Buttered popcorn. Cereals  Allowed: Hot or cold whole-grain cereals.  Avoid: Cereals containing coconut, hydrogenated vegetable fat,  or animal fat. Potatoes / Pasta / Rice  Allowed: All kinds of potatoes, rice, and pasta (such as macaroni, spaghetti, and noodles).  Avoid: Pasta or rice prepared with cream sauce or high-fat cheese. Chow mein noodles, [place] fries. Vegetables  Allowed: All vegetables and vegetable juices.  Avoid: Fried vegetables. Vegetables in cream, butter, or high-fat cheese sauces. Limit coconut. Fruit in cream or custard. Protein  Allowed: Limit your intake of meat, seafood, and poultry to no more than 6 oz (cooked weight) per day. All lean, well-trimmed beef, veal, pork, and lamb. All chicken and [place] without skin. All fish and shellfish. Wild game: wild duck, rabbit, pheasant, and venison. Egg whites or low-cholesterol egg substitutes may be used as desired. Meatless dishes: recipes with dried beans, peas, lentils, and tofu (soybean curd). Seeds and nuts: all seeds and most nuts.  Avoid: Prime grade and other heavily marbled and fatty meats, such as short ribs, spare ribs, rib eye roast or steak, frankfurters, sausage, bacon, and high-fat luncheon meats, mutton. Caviar. Commercially fried fish. Domestic duck, goose, venison sausage. Organ meats: liver, gizzard, heart, chitterlings, brains, kidney, sweetbreads. Dairy  Allowed: Low-fat cheeses: nonfat or low-fat cottage cheese (1% or 2% fat), cheeses made with part skim milk, such as mozzarella, farmers, string, or ricotta. (Cheeses should be labeled no more than 2 to 6 grams fat per oz.). Skim (or 1%) milk: liquid, powdered, or evaporated. Buttermilk made with low-fat milk. Drinks made with skim or low-fat milk or cocoa. Chocolate milk or cocoa made with skim or low-fat (1%) milk. Nonfat or low-fat yogurt.  Avoid: Whole milk cheeses, including colby, cheddar, [place], Monterey Jack, [place], Kinnelon, [place], American, Swiss, and blue. Creamed cottage cheese, cream cheese. Whole milk and whole milk products, including buttermilk or yogurt made from whole  milk, drinks made from whole milk. Condensed milk, evaporated whole milk, and 2% milk. Soups and Combination Foods  Allowed: Low-fat low-sodium soups: broth, dehydrated soups, homemade broth, soups with the fat removed, homemade cream soups made with skim or low-fat milk. Low-fat spaghetti, lasagna, chili, and Spanish rice if low-fat ingredients and low-fat cooking techniques are used.  Avoid: Cream soups made with whole milk, cream, or high-fat cheese. All other soups. Desserts and Sweets  Allowed: Sherbet, fruit ices, gelatins, meringues, and angel food cake. Homemade desserts with recommended fats, oils, and milk products. Jam, jelly, honey, marmalade, sugars, and syrups. Pure sugar candy, such as gum  drops, hard candy, jelly beans, marshmallows, mints, and small amounts of dark chocolate.  Avoid: Commercially prepared cakes, pies, cookies, frosting, pudding, or mixes for these products. Desserts containing whole milk products, chocolate, coconut, lard, palm oil, or palm kernel oil. Ice cream or ice cream drinks. Candy that contains chocolate, coconut, butter, hydrogenated fat, or unknown ingredients. Buttered syrups. Fats and Oils  Allowed: Vegetable oils: safflower, sunflower, corn, soybean, cottonseed, sesame, canola, olive, or peanut. Non-hydrogenated margarines. Salad dressing or mayonnaise: homemade or commercial, made with a recommended oil. Low or nonfat salad dressing or mayonnaise.  Limit added fats and oils to 6 to 8 tsp per day (includes fats used in cooking, baking, salads, and spreads on bread). Remember to count the "hidden fats" in foods.  Avoid: Solid fats and shortenings: butter, lard, salt pork, bacon drippings. Gravy containing meat fat, shortening, or suet. Cocoa butter, coconut. Coconut oil, palm oil, palm kernel oil, or hydrogenated oils: these ingredients are often used in bakery products, nondairy creamers, whipped toppings, candy, and commercially fried foods. Read  labels carefully. Salad dressings made of unknown oils, sour cream, or cheese, such as blue cheese and Roquefort. Cream, all kinds: half-and-half, light, heavy, or whipping. Sour cream or cream cheese (even if "light" or low-fat). Nondairy cream substitutes: coffee creamers and sour cream substitutes made with palm, palm kernel, hydrogenated oils, or coconut oil. Beverages  Allowed: Coffee (regular or decaffeinated), tea. Diet carbonated beverages, mineral water. Alcohol: Check with your caregiver. Moderation is recommended.  Avoid: Whole milk, regular sodas, and juice drinks with added sugar. Condiments  Allowed: All seasonings and condiments. Cocoa powder. "Cream" sauces made with recommended ingredients.  Avoid: Carob powder made with hydrogenated fats. SAMPLE MENU Breakfast   cup orange juice   cup oatmeal  1 slice toast  1 tsp margarine  1 cup skim milk Lunch  [place] sandwich with 2 oz [place], 2 slices bread  Lettuce and tomato slices  Fresh fruit  Carrot sticks  Coffee or tea Snack  Fresh fruit or low-fat crackers Dinner  3 oz lean ground beef  1 baked potato  1 tsp margarine   cup asparagus  Lettuce salad  1 tbs non-creamy dressing   cup peach slices  1 cup skim milk Document Released: 09/13/2008 Document Revised: 06/05/2012 Document Reviewed: 02/28/2012 [employer] Patient Information 2015 [place], [place]. This information is not intended to replace advice given to you by your health care provider. Make sure you discuss any questions you have with your health care provider.

## 2014-07-07 NOTE — Progress Notes (Signed)
Physical Medicine and Rehabilitation Consult  Reason for Consult: CVA  Referring Physician: [facility]  HPI: Joshua Henson is a 68 y.o. right-handed male with history of prostate cancer status post radiation as well as alcohol abuse. 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 Alcohol level 155 upon admission. MRI of the brain shows punctate nonhemorrhagic infarct involving the anterior left frontal lobe, remote 40mm left frontal lobe white matter infarct. Cardiac enzymes negative. Patient did not receive TPA.  Echocardiogram with normal systolic function no wall motion abnormalities. CT angiogram head and neck with atherosclerotic type changes no stenosis. Neurology services consulted placed on aspirin for CVA prophylaxis as well as subcutaneous Lovenox for DVT prophylaxis. Patient is tolerating a regular consistency diet. Physical therapy evaluation completed an ongoing with recommendations for physical medicine rehabilitation consult.  Patient gives history of middle ear fluid. Has seen ENT at [facility] of frequency and dysuria  Review of Systems  Musculoskeletal: Positive for falls.  Neurological: Positive for dizziness.  Psychiatric/Behavioral:  Anxiety  All other systems reviewed and are negative.   Past Medical History   Diagnosis  Date   .  Prostate cancer  2010     External beam radiation (urol - Risa Grill, XRT Valere Dross)    Past Surgical History   Procedure  Laterality  Date   .  None      Family History   Problem  Relation  Age of Onset   .  Cancer  Mother      esophageal   .  Hypertension  Mother    .  Diabetes  Mother    .  Cancer  Father      prostate   .  Heart disease  Maternal Grandfather      MI   .  Cancer  Paternal Grandfather      prostate    Social History: reports that he has been smoking  Cigarettes. He has a 30 pack-year smoking history. He has never used smokeless tobacco. He reports that he drinks about 3.5 ounces of alcohol per week. He reports that he does not use illicit drugs.  Allergies:  Allergies   Allergen  Reactions   .  Celebrex [Celecoxib]  Rash    Medications Prior to Admission   Medication  Sig  Dispense  Refill   .  amLODipine (NORVASC) 10 MG tablet  Take 10 mg by mouth daily.     Marland Kitchen  atorvastatin (LIPITOR) 10 MG tablet  Take 10 mg by mouth daily.     .  folic acid (FOLVITE) 1 MG tablet  Take 1 mg by mouth daily.     Marland Kitchen  lisinopril-hydrochlorothiazide (PRINZIDE,ZESTORETIC) 20-12.5 MG per tablet  Take 1 tablet by mouth daily.     Marland Kitchen  LORazepam (ATIVAN) 1 MG tablet  Take 1 mg by mouth 3 (three) times daily.     .  tamsulosin (FLOMAX) 0.4 MG CAPS capsule  Take 0.4 mg by mouth daily.     .  Vortioxetine HBr (BRINTELLIX) 10 MG TABS  Take 10 mg by mouth daily.      Home:  Home Living  Family/patient expects to be discharged to:: Private residence  Living Arrangements: Spouse/significant other  Available Help at Discharge: Family;Available 24 hours/day  Type of Home: House  Home Access: Stairs to enter  Entrance  Stairs-Number of Steps: 2-3  Home Layout: One level  Home Equipment: None  Functional History:  Prior Function  Level of Independence: Independent  Functional Status:  Mobility:  Bed Mobility  Overal bed mobility: Modified Independent  General bed mobility comments: Moves to sitting with use of hand rail and increased time.  Transfers  Overall transfer level: Needs assistance  Equipment used: Rolling walker (2 wheeled)  Transfers: Sit to/from Stand  Sit to Stand: Mod assist  Stand pivot transfers: Mod assist  General transfer comment: Verbal cues for hand placement - continues to reach for RW to pull to standing. Assist to rise to standing and to maintain balance/safety.  Ambulation/Gait  Ambulation/Gait assistance: Mod assist  Ambulation  Distance (Feet): 30 Feet  Assistive device: Rolling walker (2 wheeled)  Gait Pattern/deviations: Step-through pattern;Decreased stride length;Decreased weight shift to left;Ataxic;Staggering right;Trunk flexed  Gait velocity: Decreased  Gait velocity interpretation: Below normal speed for age/gender  General Gait Details: Patient with significant lean to right today. Attempted to work on shifting weight to left in standing. Then returns to right lean with ambulation. Required mod to max assist to prevent fall to right. Patient reports feeling lightheaded and "not right". Returned to sitting.   ADL:   Cognition:  Cognition  Overall Cognitive Status: Impaired/Different from baseline  Orientation Level: Oriented X4  Cognition  Arousal/Alertness: Awake/alert  Behavior During Therapy: Impulsive  Overall Cognitive Status: Impaired/Different from baseline  Area of Impairment: Following commands;Safety/judgement  Following Commands: Follows multi-step commands with increased time  Safety/Judgement: Decreased awareness of safety  General Comments: Less impulsive today  Blood pressure 148/88, pulse 81, temperature 98.2 F (36.8 C), temperature source Oral, resp. rate 18, height 5' 10.5" (1.791 m), weight 91.173 kg (201 lb), SpO2 95.00%.  Physical Exam  Vitals reviewed.  Constitutional: He is oriented to person, place, and time.  HENT:  Healing laceration over left eyebrow  Eyes: EOM are normal.  Without nystagmus  Neck: Normal range of motion. Neck supple. No thyromegaly present.  Cardiovascular: Normal rate and regular rhythm.  Respiratory: Effort normal and breath sounds normal. No respiratory distress.  GI: Soft. Bowel sounds are normal. He exhibits no distension.  Neurological: He is alert and oriented to person, place, and time.  Follows full commands  Skin: Skin is warm and dry.  5/5 bilateral hip flexors knee extensors ankle dorsiflexion plantar flexor  4/5 right deltoid, bicep,  tricep, grip  5/5 left deltoid, bicep, tricep, grip  Decreased finger to thumb opposition right hand.  Light touch is intact bilateral feet  No results found for this or any previous visit (from the past 24 hour(s)).  No results found.  Assessment/Plan:  Diagnosis: Left frontal infarct with reduced balance and decreased fine motor RUE  1. Does the need for close, 24 hr/day medical supervision in concert with the patient's rehab needs make it unreasonable for this patient to be served in a less intensive setting? Yes 2. Co-Morbidities requiring supervision/potential complications: History of prostate carcinoma probable UTI, and middle ear dysfunction vestibular disorder, alcohol abuse and orthostatic hypotension 3. Due to bladder management, bowel management, safety, skin/wound care, disease management, medication administration, pain management and patient education, does the patient require 24 hr/day rehab nursing? Yes 4. Does the patient require coordinated care of a physician, rehab nurse, PT (1-2 hrs/day, 5 days/week), OT (1-2 hrs/day, 5 days/week) and SLP (.5-1 hrs/day, 5 days/week) to address physical and functional deficits in the context of the above medical diagnosis(es)? Yes Addressing deficits  in the following areas: balance, endurance, locomotion, strength, transferring, bowel/bladder control, bathing, dressing, feeding, grooming, toileting and cognition 5. Can the patient actively participate in an intensive therapy program of at least 3 hrs of therapy per day at least 5 days per week? Yes 6. The potential for patient to make measurable gains while on inpatient rehab is excellent 7. Anticipated functional outcomes upon discharge from inpatient rehab are modified independent and supervision with PT, modified independent and supervision with OT, supervision with SLP. 8. Estimated rehab length of stay to reach the above functional goals is: 7-10d 9. Does the patient have adequate social  supports to accommodate these discharge functional goals? Yes 10. Anticipated D/C setting: Home 11. Anticipated post D/C treatments: Outpatient therapy 12. Overall Rehab/Functional Prognosis: excellent RECOMMENDATIONS:  This patient's condition is appropriate for continued rehabilitative care in the following setting: CIR  Patient has agreed to participate in recommended program. Yes  Note that insurance prior authorization may be required for reimbursement for recommended care.  Comment:  07/07/2014

## 2014-07-07 NOTE — Progress Notes (Signed)
Received pt. As a transfer from [address],pt. From home with girlfriend.Pt. Was oriented to [facility] plan was sign and explain,welcome video was show to pt.Pt. Has a laceration on left forehead with Tegaderm on.Also some bruises on arms and legs.Abrasion on both knees.Keep monitoring pt. And assessing needs closely.

## 2014-07-08 ENCOUNTER — Inpatient Hospital Stay (HOSPITAL_COMMUNITY): Payer: Medicare Other | Admitting: Physical Therapy

## 2014-07-08 ENCOUNTER — Inpatient Hospital Stay (HOSPITAL_COMMUNITY): Payer: Medicare Other | Admitting: Occupational Therapy

## 2014-07-08 ENCOUNTER — Inpatient Hospital Stay (HOSPITAL_COMMUNITY): Payer: Medicare Other | Admitting: Speech Pathology

## 2014-07-08 DIAGNOSIS — Z5189 Encounter for other specified aftercare: Secondary | ICD-10-CM

## 2014-07-08 DIAGNOSIS — I6330 Cerebral infarction due to thrombosis of unspecified cerebral artery: Secondary | ICD-10-CM

## 2014-07-08 DIAGNOSIS — F1010 Alcohol abuse, uncomplicated: Secondary | ICD-10-CM

## 2014-07-08 LAB — CBC WITH DIFFERENTIAL/PLATELET
Basophils Absolute: 0 10*3/uL (ref 0.0–0.1)
Basophils Relative: 1 % (ref 0–1)
EOS PCT: 2 % (ref 0–5)
Eosinophils Absolute: 0.1 10*3/uL (ref 0.0–0.7)
HCT: 34.6 % — ABNORMAL LOW (ref 39.0–52.0)
Hemoglobin: 12.8 g/dL — ABNORMAL LOW (ref 13.0–17.0)
LYMPHS ABS: 0.8 10*3/uL (ref 0.7–4.0)
LYMPHS PCT: 16 % (ref 12–46)
MCH: 38 pg — ABNORMAL HIGH (ref 26.0–34.0)
MCHC: 37 g/dL — ABNORMAL HIGH (ref 30.0–36.0)
MCV: 102.7 fL — AB (ref 78.0–100.0)
Monocytes Absolute: 0.9 10*3/uL (ref 0.1–1.0)
Monocytes Relative: 18 % — ABNORMAL HIGH (ref 3–12)
NEUTROS ABS: 3.2 10*3/uL (ref 1.7–7.7)
Neutrophils Relative %: 63 % (ref 43–77)
PLATELETS: 185 10*3/uL (ref 150–400)
RBC: 3.37 MIL/uL — AB (ref 4.22–5.81)
RDW: 14 % (ref 11.5–15.5)
WBC: 5 10*3/uL (ref 4.0–10.5)

## 2014-07-08 LAB — COMPREHENSIVE METABOLIC PANEL
ALT: 51 U/L (ref 0–53)
AST: 54 U/L — AB (ref 0–37)
Albumin: 3.3 g/dL — ABNORMAL LOW (ref 3.5–5.2)
Alkaline Phosphatase: 79 U/L (ref 39–117)
Anion gap: 10 (ref 5–15)
BILIRUBIN TOTAL: 0.7 mg/dL (ref 0.3–1.2)
BUN: 10 mg/dL (ref 6–23)
CHLORIDE: 103 meq/L (ref 96–112)
CO2: 26 meq/L (ref 19–32)
Calcium: 9.1 mg/dL (ref 8.4–10.5)
Creatinine, Ser: 0.71 mg/dL (ref 0.50–1.35)
GFR calc non Af Amer: >90 mL/min (ref >90)
GLUCOSE: 103 mg/dL — AB (ref 70–99)
POTASSIUM: 3.7 meq/L (ref 3.7–5.3)
SODIUM: 139 meq/L (ref 137–147)
Total Protein: 6.6 g/dL (ref 6.0–8.3)

## 2014-07-08 MED ORDER — ZOLPIDEM TARTRATE 5 MG PO TABS
5.0000 mg | ORAL_TABLET | Freq: Every evening | ORAL | Status: DC | PRN
  Administered 2014-07-08 (×2): 5 mg via ORAL
  Filled 2014-07-08 (×2): qty 1

## 2014-07-08 MED ORDER — ENSURE COMPLETE PO LIQD
237.0000 mL | Freq: Two times a day (BID) | ORAL | Status: DC
  Administered 2014-07-09 – 2014-07-11 (×6): 237 mL via ORAL

## 2014-07-08 NOTE — Progress Notes (Signed)
INITIAL NUTRITION ASSESSMENT  DOCUMENTATION CODES Per approved criteria  -Not Applicable   INTERVENTION: - Ensure Complete po BID, each supplement provides 350 kcal and 13 grams of protein - Multivitamin with minerals  NUTRITION DIAGNOSIS: Inadequate oral intake related to depression as evidenced by reported intake less than estimated needs and weight loss.   Goal: Pt to meet >/= 90% of their estimated nutrition needs   Monitor:  Wt, po intake, acceptance of supplements, labs  Reason for Assessment: MST  68 y.o. male  Admitting Dx: <principal problem not specified>  ASSESSMENT: 68 y.o. right-handed male with history of prostate cancer status post radiation as well as alcohol abuse. 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 Alcohol level 155 upon admission.  - Pt reports a wt loss of ~10 lbs in the past 1-2 months. He attributes wt loss to loss of appetite due to depression. Pt with no signs of significant fat and/or muscle wasting.  Height: Ht Readings from Last 1 Encounters:  07/07/14 5\' 10"  (1.778 m)    Weight: Wt Readings from Last 1 Encounters:  07/07/14 204 lb 1.6 oz (92.579 kg)    Ideal Body Weight: 73.2 kg  % Ideal Body Weight: 127%  Wt Readings from Last 10 Encounters:  07/07/14 204 lb 1.6 oz (92.579 kg)  07/07/14 200 lb 8 oz (90.946 kg)  11/05/13 217 lb (98.431 kg)    Usual Body Weight: ~215 lbs  % Usual Body Weight: 95%  BMI:  Body mass index is 29.29 kg/(m^2).  Estimated Nutritional Needs: Kcal: 2200-2400 Protein: 115-125 g Fluid: 2.2-2.4 L  Skin: Wound on head  Diet Order: Cardiac  EDUCATION NEEDS: -Education needs addressed   Intake/Output Summary (Last 24 hours) at 07/08/14 1033 Last data filed at 07/08/14 1000  Gross per 24 hour  Intake    720 ml  Output    475 ml   Net    245 ml    Last BM: 7/20   Labs:   Recent Labs Lab 07/04/14 2358 07/06/14 0430 07/08/14 0532  NA 136* 142 139  K 3.6* 3.3* 3.7  CL 99 103 103  CO2 20 23 26   BUN 7 10 10   CREATININE 0.55 0.55 0.71  CALCIUM 8.6 8.6 9.1  MG  --  1.5  --   GLUCOSE 118* 131* 103*    CBG (last 3)  No results found for this basename: GLUCAP,  in the last 72 hours  Scheduled Meds: . amLODipine  10 mg Oral Daily  . aspirin EC  81 mg Oral Daily  . atorvastatin  10 mg Oral q1800  . cefUROXime  500 mg Oral BID WC  . enoxaparin (LOVENOX) injection  40 mg Subcutaneous Q24H  . feeding supplement (ENSURE)  1 Container Oral BID BM  . fluticasone  1 spray Each Nare Daily  . folic acid  1 mg Oral Daily  . hydrochlorothiazide  12.5 mg Oral Daily  . lisinopril  20 mg Oral Daily  . loratadine  10 mg Oral Daily  . LORazepam  1 mg Oral TID  . meclizine  25 mg Oral TID  . multivitamin with minerals  1 tablet Oral Daily  . tamsulosin  0.4 mg Oral Daily  . thiamine  100 mg Oral Daily    Continuous Infusions:   Past Medical History  Diagnosis Date  .  Prostate cancer 2010    External beam radiation (urol - Risa Grill, XRT Valere Dross)  . Stroke   . Depression     Past Surgical History  Procedure Laterality Date  . None      Terrace Arabia RD, LDN

## 2014-07-08 NOTE — Evaluation (Signed)
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 CVA (cerebral infarction) 07/07/2014  . Unspecified cerebral artery occlusion with cerebral infarction 07/05/2014  . Syncope 07/02/2014  . Syncope and collapse 07/02/2014  . Scalp laceration 07/02/2014  . Alcohol abuse 07/02/2014  . Orthostatic hypotension 07/02/2014  . Hearing loss 11/25/2013  . Tinnitus of both ears 11/25/2013    Past Medical History:  Past Medical History  Diagnosis Date  . Prostate cancer 2010    External beam radiation (urol - Risa Grill, XRT Valere Dross)  . Stroke   . Depression    Past Surgical History:  Past Surgical History  Procedure Laterality Date  . None      Assessment & Plan Clinical Impression: Patient is a 68 y.o. y.o. right-handed male with history of prostate cancer status post radiation as well as alcohol abuse. 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 Alcohol level 155 upon admission. MRI of the brain shows punctate nonhemorrhagic infarct involving the anterior left frontal lobe, remote 34m left frontal lobe white matter infarct as well as bilateral maxillary sinus disease. Cardiac enzymes negative. Patient did not receive TPA.  Echocardiogram with normal systolic function no wall motion abnormalities. CT angiogram head and neck with atherosclerotic type changes no stenosis. Neurology services consulted placed on aspirin for CVA prophylaxis as well as subcutaneous  Lovenox for DVT prophylaxis. Ceftin added for maxillary sinus disease identified on MRI. Patient is tolerating a regular consistency diet. Patient has been monitored for any alcohol withdrawal and has been transitioned from Ativan to KSledge  Patient transferred to CIR on 07/07/2014 .    Patient currently requires min with basic self-care skills secondary to decreased coordination, decreased safety awareness, vestibular involvement and decreased standing balance, decreased postural control and decreased balance strategies.  Prior to hospitalization, patient could complete ADLs with independent .  Patient will benefit from skilled intervention to increase independence with basic self-care skills and increase level of independence with iADL prior to discharge home with care partner.  Anticipate patient will require intermittent supervision and follow up outpatient.  OT - End of Session Activity Tolerance: Tolerates 30+ min activity without fatigue Endurance Deficit: No OT Assessment Rehab Potential: Good OT Patient demonstrates impairments in the following area(s): Balance;Motor;Perception;Safety OT Basic ADL's Functional Problem(s): Grooming;Bathing;Dressing;Toileting OT Advanced ADL's Functional Problem(s): Simple Meal Preparation OT Transfers Functional Problem(s): Toilet;Tub/Shower OT Additional Impairment(s): None OT Plan OT Intensity: Minimum of 1-2 x/day, 45 to 90 minutes OT Frequency: 5 out of 7 days OT Duration/Estimated Length of Stay: 7-10 days OT Treatment/Interventions: Balance/vestibular training;Community reintegration;Discharge planning;Disease mangement/prevention;DME/adaptive equipment instruction;Functional mobility training;Neuromuscular re-education;Pain management;Patient/family education;Psychosocial support;Self Care/advanced ADL retraining;Therapeutic Activities;Therapeutic Exercise OT Self Feeding Anticipated Outcome(s): independent OT Basic Self-Care Anticipated  Outcome(s): mod I OT Toileting Anticipated Outcome(s): mod I OT Bathroom Transfers Anticipated Outcome(s): mod I OT Recommendation Recommendations for Other Services: Neuropsych consult;Vestibular eval Patient destination: Home Follow Up Recommendations: Outpatient OT Equipment Recommended: Tub/shower seat   Skilled Therapeutic Intervention 1) OT eval initiated, ADL assessment completed  at sit > stand level at sink.  Pt very reliant on UE support with bed mobility and standing balance at sink.  Noted slight shakiness with sitting, standing, and use of dominant RUE during self-care tasks.  Pt with decreased safety awareness with standing without supervision despite having just been asked to sit while therapist obtained towel for bathing.  Min/steady assist with standing tasks.  Educated on sitting for LB dressing and when washing BLE to increase safety and decrease LOB.  Ambulated short distances in room with min/steady assist without AD.  Discussed purpose of therapy, goals, and ELOS with pt reporting understanding.  2) Engaged in therapeutic activity with focus on [facility] and balance.  Pt ambulated to ADL apartment with min assist for stability without AD, pt with ataxic gait and decreased balance reactions.  Ambulated from ADL apt to therapy gym with RW with increased stability with gait.  Demonstrated difficulty with starts and stops and sudden movements.  Engaged in 9 hole peg test Rt: 43 seconds and Lt: 36 seconds.  Noted some decreased coordination, tremor like, in BUE greater in RUE.  Pt and girlfriend with multiple questions regarding balance and dizziness.  Discussed recommendation for vestibular eval to be conducted during rehab stay. Pt continues to demonstrate some impulsivity with mobility.  OT Evaluation Precautions/Restrictions  Precautions Precautions: Fall Precaution Comments: multiple syncopal episodes  Restrictions Weight Bearing Restrictions: No General   Vital Signs Therapy  Vitals Temp: 98.6 F (37 C) Temp src: Oral Pulse Rate: 71 Resp: 18 BP: 157/99 mmHg Patient Position (if appropriate): Lying Oxygen Therapy SpO2: 89 % O2 Device: None (Room air) Pain Pain Assessment Pain Assessment: No/denies pain Home Living/Prior Functioning Home Living Family/patient expects to be discharged to:: Private residence Living Arrangements: Spouse/significant other Available Help at Discharge: Friend(s);Other (Comment) [name] can provide 24/7 initially; realtor) Type of Home: House Home Access: Stairs to enter [employer] of Steps: 2 in garage without rail, 3 at front door with rails on both sides Home Layout: Two level;Able to live on main level with bedroom/bathroom  Lives With: Significant other;Other (Comment) (girlfriend [name]) IADL History Homemaking Responsibilities: Yes Meal Prep Responsibility: Secondary Laundry Responsibility: Secondary Bill Paying/Finance Responsibility: Primary Current License: Yes Mode of Transportation: Car Occupation: Full time employment Type of Occupation: Engineer, materials, 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 Goes to [employer] a couple times per week. drives self. Vetigo issues since 10/2013 ADL  See FIM Vision/Perception  Vision- History Baseline Vision/History: Wears glasses Wears Glasses: At all times (trifocals) Patient Visual Report: No change from baseline  Cognition Orientation Level: Oriented X4 Safety/Judgment: Impaired Sensation Sensation Light Touch: Appears Intact Proprioception: Appears Intact Coordination Fine Motor Movements are Fluid and Coordinated: Yes Finger Nose Finger Test: slight decrease in coordination Extremity/Trunk  Assessment RUE Assessment RUE Assessment: Within Functional Limits LUE Assessment LUE Assessment: Within Functional Limits  FIM:  FIM - Grooming Grooming Steps: Wash, rinse, dry face;Wash, rinse, dry hands;Oral care, brush teeth, clean dentures;Brush, comb hair;Shave or apply make-up Grooming: 5: Set-up assist to obtain items FIM - Bathing Bathing Steps Patient Completed: Chest;Right Arm;Left Arm;Abdomen;Buttocks;Right upper leg;Left upper leg Bathing: 3: Mod-Patient completes 5-7 [id number] parts or 50-74% FIM - Upper Body Dressing/Undressing Upper body dressing/undressing steps patient completed: Thread/unthread right sleeve of pullover shirt/dresss;Thread/unthread  left sleeve of pullover shirt/dress;Put head through opening of pull over shirt/dress;Pull shirt over trunk Upper body dressing/undressing: 5: Set-up assist to: Obtain clothing/put away FIM - Lower Body Dressing/Undressing Lower body dressing/undressing steps patient completed: Thread/unthread right underwear leg;Thread/unthread left underwear leg;Pull underwear up/down;Thread/unthread right pants leg;Thread/unthread left pants leg;Pull pants up/down;Don/Doff right sock;Don/Doff left sock Lower body dressing/undressing: 4: Steadying Assist FIM - Control and instrumentation engineer Devices: Bed rails Bed/Chair Transfer: 5: Supine > Sit: Supervision (verbal cues/safety issues);5: Sit > Supine: Supervision (verbal cues/safety issues);4: Bed > Chair or W/C: Min A (steadying Pt. > 75%);4: Chair or W/C > Bed: Min A (steadying Pt. > 75%)   Refer to Care Plan for Long Term Goals  Recommendations for other services: Neuropsych  Discharge Criteria: Patient will be discharged from OT if patient refuses treatment 3 consecutive times without medical reason, if treatment goals not met, if there is a change in medical status, if patient makes no progress towards goals or if patient is discharged from hospital.  The above assessment,  treatment plan, treatment alternatives and goals were discussed and mutually agreed upon: by patient  Ellwood Dense [facility] 07/08/2014, 8:15 AM

## 2014-07-08 NOTE — Evaluation (Signed)
Physical Therapy Assessment and Plan  Patient Details  Name: Joshua Henson MRN: [id number] Date of Birth: [birth year]  PT Diagnosis: Abnormality of gait, Coordination disorder and Vertigo Rehab Potential: Good ELOS: 7-10 days   Today's Date: 07/08/2014 Time: 3953-2023 Time Calculation (min): 60 min  Problem List:  Patient Active Problem List   Diagnosis Date Noted  . CVA (cerebral infarction) 07/07/2014  . Unspecified cerebral artery occlusion with cerebral infarction 07/05/2014  . Syncope 07/02/2014  . Syncope and collapse 07/02/2014  . Scalp laceration 07/02/2014  . Alcohol abuse 07/02/2014  . Orthostatic hypotension 07/02/2014  . Hearing loss 11/25/2013  . Tinnitus of both ears 11/25/2013    Past Medical History:  Past Medical History  Diagnosis Date  . Prostate cancer 2010    External beam radiation (urol - Risa Grill, XRT Valere Dross)  . Stroke   . Depression    Past Surgical History:  Past Surgical History  Procedure Laterality Date  . None      Assessment & Plan Clinical Impression: Joshua Henson is a 68 y.o. right-handed male with history of prostate cancer status post radiation as well as alcohol abuse. 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 Alcohol level 155 upon admission. MRI of the brain shows punctate nonhemorrhagic infarct involving the anterior left frontal lobe, remote 26m left frontal lobe white matter infarct as well as bilateral maxillary sinus disease. Cardiac enzymes negative. Patient did not receive TPA. Echocardiogram with normal systolic function no wall motion abnormalities. CT angiogram head and neck with atherosclerotic type changes no stenosis. Neurology services consulted placed on aspirin for CVA prophylaxis as well as subcutaneous Lovenox for DVT prophylaxis. Ceftin added for  maxillary sinus disease identified on MRI. Patient is tolerating a regular consistency diet. Patient has been monitored for any alcohol withdrawal and has been transitioned from Ativan to [place] Patient transferred to CIR on 07/07/2014 .   Patient currently requires min with mobility secondary to impaired timing and sequencing, unbalanced muscle activation and decreased coordination, decreased midline orientation, decreased safety awareness, peripheral and decreased standing balance, decreased postural control and decreased balance strategies.  Prior to hospitalization, patient was independent  with mobility and lived with Significant other in a Other(Comment) (condo) home.  Home access is 2 in garage without rail, 3 at front door with 1 rail to enter.  Patient will benefit from skilled PT intervention to maximize safe functional mobility and minimize fall risk for planned discharge home with intermittent assist.  Anticipate patient will benefit from follow up OP at discharge.  PT - End of Session Activity Tolerance: Tolerates 30+ min activity with multiple rests Endurance Deficit: No PT Assessment Rehab Potential: Good Barriers to Discharge: Other (comment) (None) PT Patient demonstrates impairments in the following area(s): Balance;Motor;Perception;Safety;Other (comment) (Vestibular) PT Transfers Functional Problem(s): Bed Mobility;Bed to Chair;Car;Furniture;Floor PT Locomotion Functional Problem(s): Ambulation;Stairs PT Plan PT Intensity: Minimum of 1-2 x/day ,45 to 90 minutes PT Frequency: 5 out of 7 days PT Duration Estimated Length of Stay: 7-10 days PT Treatment/Interventions: Ambulation/gait training;Balance/vestibular training;Cognitive remediation/compensation;Discharge planning;Neuromuscular re-education;Functional mobility training;DME/adaptive equipment instruction;Disease management/prevention;Patient/family education;UE/LE Coordination activities;Therapeutic Exercise;Therapeutic  Activities;Stair training;Visual/perceptual remediation/compensation PT Transfers Anticipated Outcome(s): Mod I PT Locomotion Anticipated Outcome(s): Supervision - Mod I PT Recommendation Recommendations for Other Services: Vestibular eval Follow Up Recommendations: Outpatient PT;Other (comment) (Vestibular Rehab) Equipment Recommended: To be determined Equipment Details:  Pt owns no personal assistive devices  Skilled Therapeutic Intervention PT evaluation performed. See below for detailed findings. Treatment initiated. Session focused on assessing/adtressing functional standing balance. Pt completed Berg Balance Scale, scoring 23/56. See below for detailed findings. Educated pt on findings and implications; focused on significant risk of falling. Pt verbalized understanding. See below for detailed description of gait pattern and assist/cueing required with gait and stair negotiation. Pt left semi reclined in bed with 2 bed rails up, bed alarm on, and all needs within reach. Explained importance of use of call bell for staff assist prior to getting out of bed/chair. Pt verbalized understanding. However, pt continued to demonstrate impaired safety awareness and impulsivity during mobility in room. Nurse tech notified.  PT Evaluation Precautions/Restrictions Precautions Precautions: Fall Precaution Comments: multiple syncopal episodes Restrictions Weight Bearing Restrictions: No General   Vital SignsTherapy Vitals Temp: 98.6 F (37 C) Temp src: Oral Pulse Rate: 71 Resp: 18 BP: 157/99 mmHg Patient Position (if appropriate): Lying Oxygen Therapy SpO2: 89 % O2 Device: None (Room air) Pain Pain Assessment Pain Assessment: No/denies pain Home Living/Prior Functioning Home Living Available Help at Discharge: Friend(s);Other (Comment) [name] can provide 24/7 initially; realtor) Type of Home: Apartment (Condo) Home Access: Stairs to enter [employer] of Steps: 2 in garage  without rail, 3 at front door with rails on both sides Entrance Stairs-Rails: None (Can reach both rails "but it's a stretch," per pt) Home Layout: Two level;Able to live on main level with bedroom/bathroom Alternate Level Stairs-Number of Steps: 13 Alternate Level Stairs-Rails: Left Additional Comments: Pt rarely goes upstairs  Lives With: Significant other;Other (Comment) (girlfriend [name]) Prior Function Level of Independence: Independent with basic ADLs;Independent with homemaking with ambulation  Able to Take Stairs?: Yes Driving: Yes Vocation: Full time employment Leisure: Hobbies-yes (Comment) 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 Goes to office a couple times per week. drives self. Vertigo issues since 10/2013 Cognition Overall Cognitive Status: Impaired/Different from baseline Arousal/Alertness: Awake/alert Orientation Level: Oriented X4 Attention: Selective Selective Attention: Appears intact Memory: Appears intact Awareness: Impaired Awareness Impairment: Emergent impairment Problem Solving: Impaired Problem Solving Impairment: Functional complex Executive Function: Self Correcting;Self Monitoring Self Monitoring: Impaired Self Monitoring Impairment: Functional complex Self Correcting: Impaired Behaviors: Restless;Poor frustration tolerance Safety/Judgment: Impaired Sensation Sensation Light Touch: Appears Intact Proprioception: Appears Intact Coordination Fine Motor Movements are Fluid and Coordinated: Yes Finger Nose Finger Test: slight decrease in coordination Heel Shin Test: Excursion equal bilat; spped, coordination of movement slightly diminished in RLE as compared with L Motor  Motor Motor: Other (comment) Motor - Skilled Clinical Observations: Postural instability in standing; staggering gait  Mobility Bed Mobility Bed Mobility: Supine to Sit;Sitting - Scoot to [employer] of Bed;Sit to Supine Supine to Sit: 5: Supervision;HOB flat Sitting - Scoot  to Edge of Bed: 5: Supervision Sit to Supine: 5: Supervision;HOB flat Transfers Transfers: Yes Sit to Stand: 4: Min assist Stand to Sit: 4: Min assist Stand Pivot Transfers: 4: Min assist Locomotion  Ambulation Ambulation: Yes Ambulation/Gait Assistance: 4: Min assist Ambulation Distance (Feet): 130 Feet Assistive device: None Ambulation/Gait Assistance Details: Verbal cues for precautions/safety Ambulation/Gait Assistance Details: Pt performed gait 2 x130' in controlled environment with min A for gait stability secondary to staggering gait pattern, decreased weight shift to L, and occasional R LE adduction (scissoring vs compensation for lateral hip instability). Gait Gait: Yes Gait Pattern: Impaired Gait Pattern: Step-through pattern;Decreased weight shift to left;Decreased stance time - left Stairs / Additional Locomotion Stairs: Yes Stairs Assistance: 4: Min  assist Stairs Assistance Details: Verbal cues for technique;Verbal cues for precautions/safety;Verbal cues for gait pattern Stairs Assistance Details (indicate cue type and reason): Pt required min A for LLE clearance during descent secondary to L toe catch x2 episodes. Stair Management Technique: Two rails;Step to pattern;Forwards Number of Stairs: 5 Wheelchair Mobility Wheelchair Mobility: No (Pt ambulatory on unit)  Trunk/Postural Assessment  Cervical Assessment Cervical Assessment: Within Functional Limits Thoracic Assessment Thoracic Assessment: Within Functional Limits Lumbar Assessment Lumbar Assessment: Within Functional Limits Postural Control Postural Control: Deficits on evaluation Trunk Control: In standing, pt demonstrates posterior preference which increases with fatigue. When balance challenged in standing, pt also with significant lateral trunk lean to L side. Righting Reactions: Pt consistently demonstrates effective ankle strategy with A/P perturbations in standing. Stepping strategy present but less  effective (several small steps with A/P perturbation). Hip strategy not present with balance pertubations in all directions.  Balance Balance Balance Assessed: Yes Standardized Balance Assessment Standardized Balance Assessment: Berg Balance Test Berg Balance Test Sit to Stand: Able to stand using hands after several tries Standing Unsupported: Able to stand 2 minutes with supervision (posterior preference) Sitting with Back Unsupported but Feet Supported on Floor or Stool: Able to sit safely and securely 2 minutes Stand to Sit: Uses backs of legs against chair to control descent Transfers: Able to transfer with verbal cueing and /or supervision Standing Unsupported with Eyes Closed: Able to stand 10 seconds with supervision (increased A/P postural sway) Standing Ubsupported with Feet Together: Able to place feet together independently but unable to hold for 30 seconds From Standing, Reach Forward with Outstretched Arm: Reaches forward but needs supervision From Standing Position, Pick up Object from Floor: Unable to try/needs assist to keep balance (Deferred secondary to pt fear of falling) From Standing Position, Turn to Look Behind Over each Shoulder: Needs supervision when turning Turn 360 Degrees: Needs close supervision or verbal cueing (Multiple compensatory steps backward to avoid posterior LOB) Standing Unsupported, Alternately Place Feet on Step/Stool: Needs assistance to keep from falling or unable to try (Significant lean to R side) Standing Unsupported, One Foot in Front: Able to take small step independently and hold 30 seconds Standing on One Leg: Unable to try or needs assist to prevent fall (LOB to L side when attempting to raise RLE) Total Score: 23 Dynamic Standing Balance Dynamic Standing - Level of Assistance: 3: Mod assist;4: Min assist Dynamic Standing - Comments: See Berg Balance Scale above. Extremity Assessment  RUE Assessment RUE Assessment: Within Functional  Limits LUE Assessment LUE Assessment: Within Functional Limits RLE Assessment RLE Assessment: Within Functional Limits LLE Assessment LLE Assessment: Within Functional Limits  FIM:  FIM - Bed/Chair Transfer Bed/Chair Transfer: 5: Supine > Sit: Supervision (verbal cues/safety issues);5: Sit > Supine: Supervision (verbal cues/safety issues);4: Bed > Chair or W/C: Min A (steadying Pt. > 75%);4: Chair or W/C > Bed: Min A (steadying Pt. > 75%) FIM - Locomotion: Ambulation Locomotion: Ambulation Assistive Devices: Other (comment) (no A/D) Ambulation/Gait Assistance: 4: Min assist Locomotion: Ambulation: 2: Travels 50 - 149 ft with minimal assistance (Pt.>75%) FIM - Locomotion: Stairs Locomotion: Stairs: 2: Up and Down 4 - 11 stairs with minimal assistance (Pt.>75%)   Refer to Care Plan for Long Term Goals  Recommendations for other services: Other: Vestibular evaluation  Discharge Criteria: Patient will be discharged from PT if patient refuses treatment 3 consecutive times without medical reason, if treatment goals not met, if there is a change in medical status, if patient makes no progress  towards goals or if patient is discharged from hospital.  The above assessment, treatment plan, treatment alternatives and goals were discussed and mutually agreed upon: by patient  Stefano Gaul 07/08/2014, 4:17 PM

## 2014-07-08 NOTE — Care Management Note (Signed)
Inpatient [facility] Individual Statement of Services  Patient Name:  Joshua Henson  Date:  07/08/2014  Welcome to the [facility].  Our goal is to provide you with an individualized program based on your diagnosis and situation, designed to meet your specific needs.  With this comprehensive rehabilitation program, you will be expected to participate in at least 3 hours of rehabilitation therapies Monday-Friday, with modified therapy programming on the weekends.  Your rehabilitation program will include the following services:  Physical Therapy (PT), Occupational Therapy (OT), Speech Therapy (ST), 24 hour per day rehabilitation nursing, Therapeutic Recreaction (TR), Neuropsychology, Case Management (Social Worker), Rehabilitation Medicine, Nutrition Services and Pharmacy Services  Weekly team conferences will be held on Wednesday to discuss your progress.  Your Social Worker will talk with you frequently to get your input and to update you on team discussions.  Team conferences with you and your family in attendance may also be held.  Expected length of stay: 7 days Overall anticipated outcome: supervision/mod/i level  Depending on your progress and recovery, your program may change. Your Social Worker will coordinate services and will keep you informed of any changes. Your Social Worker's name and contact numbers are listed  below.  The following services may also be recommended but are not provided by the [facility] will be made to provide these services after discharge if needed.  Arrangements include referral to agencies that provide these services.  Your insurance has been verified to be:  Medicare & [place] Your primary doctor is:  [facility]  Pertinent information will be shared with your  doctor and your insurance company.  Social Worker:  Ovidio Kin, [place] or ([phone]  Information discussed with and copy given to patient by: Elease Hashimoto, 07/08/2014, 1:12 PM

## 2014-07-08 NOTE — Progress Notes (Signed)
68 y.o. right-handed male with history of prostate cancer status post radiation as well as alcohol abuse. 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 Alcohol level 155 upon admission. MRI of the brain shows punctate nonhemorrhagic infarct involving the anterior left frontal lobe, remote 48mm left frontal lobe white matter infarct as well as bilateral maxillary sinus disease. Cardiac enzymes negative. Patient did not receive TPA  Subjective/Complaints: Slept ok No new issues  Objective: Vital Signs: Blood pressure 157/99, pulse 71, temperature 98.6 F (37 C), temperature source Oral, resp. rate 18, height 5\' 10"  (1.778 m), weight 92.579 kg (204 lb 1.6 oz), SpO2 89.00%. No results found. Results for orders placed during the hospital encounter of 07/07/14 (from the past 72 hour(s))  CBC WITH DIFFERENTIAL     Status: Abnormal   Collection Time    07/08/14  5:32 AM      Result Value Ref Range   WBC 5.0  4.0 - 10.5 K/uL   RBC 3.37 (*) 4.22 - 5.81 MIL/uL   Hemoglobin 12.8 (*) 13.0 - 17.0 g/dL   HCT 34.6 (*) 39.0 - 52.0 %   MCV 102.7 (*) 78.0 - 100.0 fL   MCH 38.0 (*) 26.0 - 34.0 pg   MCHC 37.0 (*) 30.0 - 36.0 g/dL   RDW 14.0  11.5 - 15.5 %   Platelets 185  150 - 400 K/uL   Neutrophils Relative % 63  43 - 77 %   Neutro Abs 3.2  1.7 - 7.7 K/uL   Lymphocytes Relative 16  12 - 46 %   Lymphs Abs 0.8  0.7 - 4.0 K/uL   Monocytes Relative 18 (*) 3 - 12 %   Monocytes Absolute 0.9  0.1 - 1.0 K/uL   Eosinophils Relative 2  0 - 5 %   Eosinophils Absolute 0.1  0.0 - 0.7 K/uL   Basophils Relative 1  0 - 1 %   Basophils Absolute 0.0  0.0 - 0.1 K/uL     HEENT: normal Cardio: RRR Resp: CTA B/L GI: BS positive Extremity:  Pulses positive and No Edema Skin:   Intact Neuro: Alert/Oriented, Cranial Nerve II-XII normal, Normal Sensory, Normal  Motor and Other bilateral upper ext tremor Musc/Skel:  Normal Gen NAD   Assessment/Plan: 1. Functional deficits secondary to Left anterior frontal infarct  which require 3+ hours per day of interdisciplinary therapy in a comprehensive inpatient rehab setting. Physiatrist is providing close team supervision and 24 hour management of active medical problems listed below. Physiatrist and rehab team continue to assess barriers to discharge/monitor patient progress toward functional and medical goals. FIM:                   Comprehension Comprehension Mode: Auditory Comprehension: 5-Follows basic conversation/direction: With extra time/assistive device  Expression Expression Mode: Verbal Expression: 6-Expresses complex ideas: With extra time/assistive device  Social Interaction Social Interaction: 6-Interacts appropriately with others with medication or extra time (anti-anxiety, antidepressant).  Problem Solving Problem Solving: 6-Solves complex problems: With extra time  Memory Memory: 6-More than reasonable amt of time  Medical Problem List and Plan:  1. Functional deficits secondary to thrombotic left frontal infarct  2. DVT Prophylaxis/Anticoagulation: Subcutaneous Lovenox. Monitor platelet counts any signs of bleeding  3. Pain Management: Oxycodone 5 mg every 4 as needed moderate pain. Monitor with increased mobility  4. Mood/anxiety/depression.  Klonopin 1 mg twice a day . Provide emotional support: Patient on Brintellix 10 mg daily and may bring his own supply  -will consult neuropsych for depression and coping skills. He reports battling depression for some time  5. Neuropsych: This patient is capable of making decisions on his own behalf.  6. Hypertension. Prinzide 20-12.5 mg daily, Norvasc 10 mg daily. Monitor with increased mobility  7. History of alcohol abuse. Monitor for any signs of withdrawal. Provide counseling on floor  8. Hyperlipidemia. Lipitor  9.  Bilateral maxillary sinus disease. Complete course of Ceftin initiated 07/06/2014 x2 weeks  10. History of prostate cancer. Continue Flomax.    LOS (Days) 1 A FACE TO FACE EVALUATION WAS PERFORMED  Darnise Montag E 07/08/2014, 6:15 AM

## 2014-07-08 NOTE — Evaluation (Signed)
Speech Language Pathology Assessment and Plan  Patient Details  Name: Joshua Henson MRN: [id number] Date of Birth: 03-11-1946  SLP Diagnosis: Cognitive Impairments  Rehab Potential: Good ELOS: 7 days    Today's Date: 07/08/2014 Time: 4356-8616 Time Calculation (min): 55 min  Problem List:  Patient Active Problem List   Diagnosis Date Noted  . CVA (cerebral infarction) 07/07/2014  . Unspecified cerebral artery occlusion with cerebral infarction 07/05/2014  . Syncope 07/02/2014  . Syncope and collapse 07/02/2014  . Scalp laceration 07/02/2014  . Alcohol abuse 07/02/2014  . Orthostatic hypotension 07/02/2014  . Hearing loss 11/25/2013  . Tinnitus of both ears 11/25/2013   Past Medical History:  Past Medical History  Diagnosis Date  . Prostate cancer 2010    External beam radiation (urol - Risa Grill, XRT Valere Dross)  . Stroke   . Depression    Past Surgical History:  Past Surgical History  Procedure Laterality Date  . None      Assessment / Plan / Recommendation Clinical Impression   Joshua Henson is a 68 y.o. right-handed male with history of prostate cancer status post radiation as well as alcohol abuse. 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 Alcohol level 155 upon admission. MRI of the brain shows punctate nonhemorrhagic infarct involving the anterior left frontal lobe, remote 68m left frontal lobe white matter infarct. Cardiac enzymes negative. Patient did not receive TPA.  Echocardiogram with normal systolic function no wall motion abnormalities. CT angiogram head and neck with atherosclerotic type changes no stenosis. Neurology services consulted placed on aspirin for CVA prophylaxis as well as subcutaneous Lovenox for DVT prophylaxis. Patient is tolerating a regular consistency diet. Physical therapy  evaluation completed an ongoing with recommendations for physical medicine rehabilitation consult.  Pt admitted to CIR 07/07/2014 with SLP eval completed 07/08/2014 with the following results.  Pt presents with mild cognitive impairments for higher level executive function skills such as planning, thought organization, and error awareness.  Additionally, pt presents with decreased safety awareness for which he is able to verbalize recommended safety precautions but requires cuing to use them during functional tasks. Suspect that pt had some level of underlying cognitive impairment at baseline given his history of alcohol abuse and depression; however, pt was independent for medication and financial management and was working as a lChief Executive Officerprior to admission, none of which are recommended at this time given pt's decreased error awareness and decreased attention to detail.  Pt's primary barrier appears to be motivation to participate in structured tasks as he was noted to evade tasks and/or abandon them as they became more difficult.  SLP educated patient regarding role of speech therapist, rationale for cognitive evaluation in the setting of acute CVA, and follow up recommendations for ongoing diagnostic treatment while inpatient to maximize functional independence and return to previous level of function.  Pt would benefit from speech follow up while inpatient targeting cognitive function, most specifically complex problem solving and safety awareness.        Skilled Therapeutic Interventions          Cognitive-linguistic evaluation completed with results and recommendations reviewed with family.     SLP Assessment  [place] [place] [facility] [place]    Recommendations  Recommendations for Other Services: Neuropsych consult Patient destination: Home Follow up Recommendations: Other (  comment) (ST follow up recs TBD pending progress made in house, anticipate  need for intermittent supervision ) Equipment Recommended: None recommended by SLP    SLP Frequency 5 out of 7 days   SLP Treatment/Interventions Cognitive remediation/compensation;Environmental controls;Internal/external aids;Patient/family education;Functional tasks;Cueing hierarchy    Pain Pain Assessment Pain Assessment: No/denies pain Prior Functioning Cognitive/Linguistic Baseline: Within functional limits Type of Home: Other(Comment) (condo)  Lives With: Significant other Available Help at Discharge: Other (Comment) (significant other available ) Education: pt was a Chief Executive Officer prior to admission  Vocation: Works at home  [place]: Week 1: SLP Short Term Goal 1 (Week 1): Pt will improve semi-complex problem solving for medication and financial management for 80% accuracy wtih min assist-supervision  SLP Short Term Goal 2 (Week 1): Pt will improve safety awareness during functional tasks for 80% accuracy with supervision  SLP Short Term Goal 3 (Week 1): Pt will identify at least 1 cognitive impairment as well as its impact on his functional independence at home with supervision question cues.   See FIM for current functional status Refer to Care Plan for Long Term Goals  Recommendations for other services: Neuropsych  Discharge Criteria: Patient will be discharged from SLP if patient refuses treatment 3 consecutive times without medical reason, if treatment goals not met, if there is a change in medical status, if patient makes no progress towards goals or if patient is discharged from hospital.  The above assessment, treatment plan, treatment alternatives and goals were discussed and mutually agreed upon: by patient  Windell Moulding, M.A. CCC-SLP  Sejla Marzano, Selinda Orion 07/08/2014, 4:07 PM

## 2014-07-08 NOTE — Progress Notes (Signed)
Patient information reviewed and entered into eRehab system by Concha Sudol, RN, CRRN, PPS Coordinator.  Information including medical coding and functional independence measure will be reviewed and updated through discharge.     Per nursing patient was given "Data Collection Information Summary for Patients in Inpatient Rehabilitation Facilities with attached "Privacy Act Statement-Health Care Records" upon admission.  

## 2014-07-08 NOTE — Progress Notes (Signed)
Social Work Assessment and Plan Social Work Assessment and Plan  Patient Details  Name: Joshua Henson MRN: [id number] Date of Birth: [birth year]  Today's Date: 07/08/2014  Problem List:  Patient Active Problem List   Diagnosis Date Noted  . CVA (cerebral infarction) 07/07/2014  . Unspecified cerebral artery occlusion with cerebral infarction 07/05/2014  . Syncope 07/02/2014  . Syncope and collapse 07/02/2014  . Scalp laceration 07/02/2014  . Alcohol abuse 07/02/2014  . Orthostatic hypotension 07/02/2014  . Hearing loss 11/25/2013  . Tinnitus of both ears 11/25/2013   Past Medical History:  Past Medical History  Diagnosis Date  . Prostate cancer 2010    External beam radiation (urol - Risa Grill, XRT Valere Dross)  . Stroke   . Depression    Past Surgical History:  Past Surgical History  Procedure Laterality Date  . None     Social History:  reports that he has been smoking Cigarettes.  He has a 30 pack-year smoking history. He has never used smokeless tobacco. He reports that he drinks about 3.5 ounces of alcohol per week. He reports that he does not use illicit drugs.  Family / Support Systems Marital Status: Separated Patient Roles: Parent;Partner;Other (Comment) Scientist, clinical (histocompatibility and immunogenetics)) Spouse/Significant Other: Michelle-significant other [phone]-cell Other Supports: Friends and colleagues Anticipated Caregiver: [name] Ability/Limitations of Caregiver: [name] is a Cabin crew she can work out of the home, to be there for pt Caregiver Availability: 24/7 Family Dynamics: Close knit family many issues with divorce and his own health issues.  He states: " There is so many issues where do you begin."  [name] wants to address the real issues-his sinus issues and vertigo which has been going on since Nov 2014.  Social History Preferred language: English Religion: None Cultural Background: No issues Education: Law Degree Read: Yes Write: Yes Employment Status: Employed Name of  Employer: [employer], clinical of Employment: 89 Return to Work Plans: Plans to return was PTA from home, sitting due to vertigo Legal Hisotry/Current Legal Issues: No issues Guardian/Conservator: None according to MD pt is capable of making his own decisions while here.  [name] is very involved and here daily   Abuse/Neglect Physical Abuse: Denies Verbal Abuse: Denies Sexual Abuse: Denies Exploitation of patient/patient's resources: Denies Self-Neglect: Denies  Emotional Status Pt's affect, behavior adn adjustment status: Pt is motivated to improve and regain his strength and balance.  He hopes the therapist can work on his vertigo and [name] wants to see if the ENT will come in and see him while here, since she feels it is his sinuses.  He is vocal and open to working hard and doing his part in his recovery. Recent Psychosocial Issues: Other medical issues  Pyschiatric History: History of anxiety and depression would benefit from Neuro-psych seeing him while he is here.  He does take medicines which he feels help and plans ot continue he is open to Op intervention and follow up. Substance Abuse History: ETOH realizes he has been drinking too much and needs to quit.  He has spoken with PCP and was placed on medicine, which she feels helps.  Aware of the ETOH resources available to him.  Patient / Family Perceptions, Expectations & Goals Pt/Family understanding of illness & functional limitations: Pt and [name] have a good understanding of his strokes, but feels it is his vertigo and sinus issues.  Appointment to see ENT in Sept and wonders if this can be moved up or have him see him here while in hospital  Both  want the real issue addressed and dealth with while here. Premorbid pt/family roles/activities: Boyfriend, Father, Chief Executive Officer, Home owner, [employer] member, etc Anticipated changes in roles/activities/participation: resume Pt/family expectations/goals: Pt states: " I want to feel  better and get stronger while I'm here, I realize today how weak I am."  [name] states: " I worry the right person will not knwo what needs to be done while here."  " I want to have the real issue worked on, his sinsues, this seems to be what has been happening since Nov."  [employer]: None Premorbid Home Care/DME Agencies: None Transportation available at discharge: [employer] referrals recommended: Support group (specify) (CVA SUpport group )  Discharge Planning Living Arrangements: Spouse/significant other Support Systems: Spouse/significant other;Children;Friends/neighbors;Church/faith community Type of Residence: Private residence Insurance underwriter Resources: Education officer, museum (specify) Nurse, mental health) Financial Resources: Employment Financial Screen Referred: No Living Expenses: Own Money Management: Patient Does the patient have any problems obtaining your medications?: No Home Management: [name] does home management Patient/Family Preliminary Plans: Return home with [name] who can arrange her schedule to be there with him for a short time.  She does work as a Cabin crew and at times needs to be gone from home.  Pt was working prior to admission from home with limited going into the office.  She plans to be here and observe him in therapies. Social Work Anticipated Follow Up Needs: HH/OP;Support Group  Clinical Impression Pleasant gentleman who is motivated to work hard in therapies and push through his vertigo.  He wants to have his vertigo issues addressed while here and [name] would like ENT to come in and see Pt while here, so the main issue she feels can be addressed while also receiving therapy to improve his balance and strengthening.  Will ask MD if possibility of having ENT come in to evaluate pt. Will work on discharge plan and have Neuro-psych see while here also.  Elease Hashimoto 07/08/2014, 1:55 PM

## 2014-07-09 ENCOUNTER — Inpatient Hospital Stay (HOSPITAL_COMMUNITY): Payer: Medicare Other | Admitting: Occupational Therapy

## 2014-07-09 ENCOUNTER — Inpatient Hospital Stay (HOSPITAL_COMMUNITY): Payer: Medicare Other

## 2014-07-09 ENCOUNTER — Inpatient Hospital Stay (HOSPITAL_COMMUNITY): Payer: Medicare Other | Admitting: Physical Therapy

## 2014-07-09 LAB — TSH: TSH: 4.51 u[IU]/mL — ABNORMAL HIGH (ref 0.350–4.500)

## 2014-07-09 LAB — RPR

## 2014-07-09 MED ORDER — MIRTAZAPINE 7.5 MG PO TABS
7.5000 mg | ORAL_TABLET | Freq: Every day | ORAL | Status: DC
  Administered 2014-07-09 – 2014-07-10 (×2): 7.5 mg via ORAL
  Filled 2014-07-09 (×3): qty 1

## 2014-07-09 MED ORDER — ZOLPIDEM TARTRATE 5 MG PO TABS
5.0000 mg | ORAL_TABLET | Freq: Once | ORAL | Status: AC
  Administered 2014-07-09: 5 mg via ORAL
  Filled 2014-07-09: qty 1

## 2014-07-09 MED ORDER — ZOLPIDEM TARTRATE 5 MG PO TABS
10.0000 mg | ORAL_TABLET | Freq: Every evening | ORAL | Status: DC | PRN
  Administered 2014-07-09 – 2014-07-10 (×2): 10 mg via ORAL
  Filled 2014-07-09 (×3): qty 2

## 2014-07-09 NOTE — Progress Notes (Signed)
Social Work Elease Hashimoto, LCSW Social Worker Signed  Patient Care Conference Service date: 07/09/2014 3:10 PM  Inpatient RehabilitationTeam Conference and Plan of Care Update Date: 07/09/2014   Time: 10;45 Am     Patient Name: Joshua Henson       Medical Record Number: [id number]   Date of Birth: 27-Jun-1946 Sex: Male         Room/Bed: 4M04C/4M04C-01 Payor Info: Payor: MEDICARE / Plan: MEDICARE PART A AND B / Product Type: *No Product type* /   Admitting Diagnosis: CVA NIH   Admit Date/Time:  07/07/2014  3:58 PM Admission Comments: No comment available   Primary Diagnosis:  <principal problem not specified> Principal Problem: <principal problem not specified>    Patient Active Problem List     Diagnosis  Date Noted   .  CVA (cerebral infarction)  07/07/2014   .  Unspecified cerebral artery occlusion with cerebral infarction  07/05/2014   .  Syncope  07/02/2014   .  Syncope and collapse  07/02/2014   .  Scalp laceration  07/02/2014   .  Alcohol abuse  07/02/2014   .  Orthostatic hypotension  07/02/2014   .  Hearing loss  11/25/2013   .  Tinnitus of both ears  11/25/2013     Expected Discharge Date: Expected Discharge Date: 07/12/14  Team Members Present: Physician leading conference: Dr. Alysia Penna Social Worker Present: Ovidio Kin, LCSW Nurse Present: Elliot Cousin, RN PT Present: Georjean Mode, PT;Blair Hobble, PT OT Present: Simonne Come, OT SLP Present: Windell Moulding, SLP PPS Coordinator present : Ileana Ladd, Lelan Pons, RN, CRRN        Current Status/Progress  Goal  Weekly Team Focus   Medical     Acute care Right benign positional vertigo, rehab Left BPPV, cognitive deficits with poor awareness  arrange outpt f/u ENT, Psych, PT/OT  see above   Bowel/Bladder     Continent of bowel and bladder. LBM 07/08/2014.  Remain continent of bowel and bladder.  Monitor for any s/s of diarrhea/constipation.   Swallow/Nutrition/ Hydration     WFL       ADL's      min assist overall  Mod I  safety awareness, balance, family education   Mobility     Min A overall  Mod I  Postural/gait stability, dynamic standing balance, address vestibular symptoms, patient/family education   Communication     [facility]       Safety/Cognition/ Behavioral Observations    min assist-supervision  Mod I   education for compensatory strategies, medication and financial management tasks.    Pain     n/a  Pain level 3 or less on a scale of 0-10.  Monitor for any onset of pain.   Skin     Laceration to left elbow with tegaderm, bruises to both arm. Ecchymosis to abdomen left and right. Abrasion to bil. knee-healed.  No new skin breakdown/ infection.  Assess skin q shift.     *See Care Plan and progress notes for long and short-term goals.    Barriers to Discharge:  denial of cognitive deficits, hx ETOH      Possible Resolutions to Barriers:    See above      Discharge Planning/Teaching Needs:    Home with grilfriend providing care for short time-can work from home.  She wants an ENT to see before d/c      Team Discussion:    Adjusted meds for sleep and depression.  Vestibular eval today.  Neuro-psych to see today.  Multifactorial problem which will take time to resolve.  MD to contact Dr. Elwyn Reach regarding earlier appt   Revisions to Treatment Plan:    Downgraded goals-supervision level-safety concerns    Continued Need for Acute Rehabilitation Level of Care: The patient requires daily medical management by a physician with specialized training in physical medicine and rehabilitation for the following conditions: Daily direction of a multidisciplinary physical rehabilitation program to ensure safe treatment while eliciting the highest outcome that is of practical value to the patient.: Yes Daily medical management of patient stability for increased activity during participation in an intensive rehabilitation regime.: Yes Daily analysis of laboratory values and/or  radiology reports with any subsequent need for medication adjustment of medical intervention for : Neurological problems;Other  Elease Hashimoto 07/09/2014, 3:10 PM          Patient ID: Joshua Henson, male   DOB: 1946/06/13, 68 y.o.   MRN: [id number]

## 2014-07-09 NOTE — Procedures (Signed)
Diagnostic Brainstem Auditory Evoked Response (BAER)  Name:  Joshua Henson DOB:   Jun 19, 1946 MRN:    [id number]  Reason for Referral: Vertigo Asymmetric hearing loss (patient reported greater loss in the left ear with onset a few months ago)   Audiological Assessment:  Bedside audiogram with inserts Prior to performing BAER testing, audiometric testing was performed to assist in BAER interpretation  Audiometric Results in dB HL:              Test reliability good    500 Hz 1000 Hz  2000   Hz 4000 Hz 6000 Hz 8000 Hz  Right ear Air conduction  30 25 50 60 40 45  Left ear Air conduction  55 55 50 55 55 55  Left ear Bone conduction Unmasked Masked 40 50 30 40 45  45  - -   Tympanometry Patient stated he had fluid in his left ear a few weeks ago, so performed to rule out middle ear issues that could affect BAER results Left ear:  Normal ear canal volume, tympanic membrane compliance and pressure (type A). Right ear: Normal ear canal volume, tympanic membrane compliance and pressure (type A).  Brainstem Auditory Evoked Response (BAER): Testing was performed using 11.1clicks/sec. presented to each ear separately through insert earphones. Testing was performed while Joshua Henson was in a natural sleep; snoring during test.  Waves I, III, and V showed fair waveform morphology but absolute and inter-peak latencies at 85dB nHL are within normal limits in each ear.   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  Tympanometry showed normal middle ear function bilaterally.  BAER results are also within normal limits and symmetrical.   Fair morphology may be due artifact: Joshua Henson snoring and movements while asleep.  Patient/Family Education:  The patient  agreed to testing per MD order.  The test results and recommendations were explained to Joshua Henson.  Recommendations:  Follow up with Dr. Thornell Mule  If you have any questions, please call [phone]   Sherri A. Rosana Hoes, Au.D., CCC-A Doctor of Audiology 07/09/2014  5:45 PM

## 2014-07-09 NOTE — Progress Notes (Addendum)
Physical Therapy Session Note  Patient Details  Name: Joshua Henson MRN: [id number] Date of Birth: 12-04-1946  Today's Date: 07/09/2014 Time: 0910-0930 Time Calculation (min): 20 min  Short Term Goals: Week 1:  PT Short Term Goal 1 (Week 1): Pt will transfer from bed<>chair with supervision and 25% cueing for safety awareness. PT Short Term Goal 2 (Week 1): Pt will perform gait x150' in controlled environment using LRAD with supervision and no overt LOB. PT Short Term Goal 3 (Week 1): Pt will negotiate 3 stairs using single rail with supervision and no overt LOB. PT Short Term Goal 4 (Week 1): Pt will ambulate x75' in home environment using LRAD with supervision and effective obstacle negotiation. PT Short Term Goal 5 (Week 1): Pt will ambulate x150' in community environment with supervision and no overt LOB.  Skilled Therapeutic Interventions/Progress Updates:   Session focused on vestibular evaluation and increasing stability/independence with gait. See vestibular evaluation for detailed findings of evaluation as well as interverventions. Following L Canolith Repositioning Maneuver, pt peformed gait x175', x190' in controlled and home environments without assistive device with min guard to min A. Overall, pt demonstrates improved gait stability (significantly less staggering) as compared with previous sessions. Pt negotiated 10 standard stairs with RUE support at R hand rail, forward-facing with step-to pattern and mi guard secondary to inconsistent LLE clearance (L toe catch x1 noted during this trial). Cueing emphasized step-to gait pattern and use of RUE only to simulate entrance of pt's home. Upon reutnring to stairs approximately 5 minutes later, pt with poor carryover of cueing, as exhibited by pt attempting to hold onto bilat rails (despite rails being very far apart, causing decreased safety) and pt attempting to perform reciprocal gait pattern. Session ended in pt room, where pt was left  semi reclined in bed with 2 bed rails up, bed alarm on, and all needs within reach.  Long term goals downgraded to supervision overall secondary to poor safety awareness and inconsistent within-session and between-session carryover of education/cueing.  Therapy Documentation Precautions:  Precautions Precautions: Fall Precaution Comments: multiple syncopal episodes Restrictions Weight Bearing Restrictions: No Vital Signs: Therapy Vitals Temp: 98.6 F (37 C) Temp src: Oral Pulse Rate: 91 Resp: 18 BP: 118/78 mmHg Patient Position (if appropriate): Lying Oxygen Therapy SpO2: 95 % O2 Device: None (Room air) Pain: Pain Assessment Pain Assessment: No/denies pain  See FIM for current functional status  Therapy/Group: Individual Therapy  Hobble, Malva Cogan 07/09/2014, 4:29 PM

## 2014-07-09 NOTE — Progress Notes (Addendum)
68 y.o. right-handed male with history of prostate cancer status post radiation as well as alcohol abuse. 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 Alcohol level 155 upon admission. MRI of the brain shows punctate nonhemorrhagic infarct involving the anterior left frontal lobe, remote 70m left frontal lobe white matter infarct as well as bilateral maxillary sinus disease. Cardiac enzymes negative. Patient did not receive TPA  Subjective/Complaints: Slept ok No new issues Speech reported decreased error recognition but pt states he hasn't had problems at work prior to, slowed downed related to dizziness Objective: Vital Signs: Blood pressure 147/92, pulse 77, temperature 98.6 F (37 C), temperature source Oral, resp. rate 18, height _0  (1.778 m), weight 92.352 kg (203 lb 9.6 oz), SpO2 93.00%. No results found. Results for orders placed during the hospital encounter of 07/07/14 (from the past 72 hour(s))  CBC WITH DIFFERENTIAL     Status: Abnormal   Collection Time    07/08/14  5:32 AM      Result Value Ref Range   WBC 5.0  4.0 - 10.5 K/uL   RBC 3.37 (*) 4.22 - 5.81 MIL/uL   Hemoglobin 12.8 (*) 13.0 - 17.0 g/dL   HCT 34.6 (*) 39.0 - 52.0 %   MCV 102.7 (*) 78.0 - 100.0 fL   MCH 38.0 (*) 26.0 - 34.0 pg   MCHC 37.0 (*) 30.0 - 36.0 g/dL   RDW 14.0  11.5 - 15.5 %   Platelets 185  150 - 400 K/uL   Neutrophils Relative % 63  43 - 77 %   Neutro Abs 3.2  1.7 - 7.7 K/uL   Lymphocytes Relative 16  12 - 46 %   Lymphs Abs 0.8  0.7 - 4.0 K/uL   Monocytes Relative 18 (*) 3 - 12 %   Monocytes Absolute 0.9  0.1 - 1.0 K/uL   Eosinophils Relative 2  0 - 5 %   Eosinophils Absolute 0.1  0.0 - 0.7 K/uL   Basophils Relative 1  0 - 1 %   Basophils Absolute 0.0  0.0 - 0.1 K/uL  COMPREHENSIVE METABOLIC PANEL     Status: Abnormal   Collection  Time    07/08/14  5:32 AM      Result Value Ref Range   Sodium 139  137 - 147 mEq/L   Potassium 3.7  3.7 - 5.3 mEq/L   Chloride 103  96 - 112 mEq/L   CO2 26  19 - 32 mEq/L   Glucose, Bld 103 (*) 70 - 99 mg/dL   BUN 10  6 - 23 mg/dL   Creatinine, Ser 0.71  0.50 - 1.35 mg/dL   Calcium 9.1  8.4 - 10.5 mg/dL   Total Protein 6.6  6.0 - 8.3 g/dL   Albumin 3.3 (*) 3.5 - 5.2 g/dL   AST 54 (*) 0 - 37 U/L   ALT 51  0 - 53 U/L   Alkaline Phosphatase 79  39 - 117 U/L   Total Bilirubin 0.7  0.3 - 1.2 mg/dL   GFR calc non Af Amer >90  >90 mL/min   GFR calc Af Amer >90  >90 mL/min   Comment: (NOTE)     The eGFR has been calculated using the CKD EPI equation.     This calculation has not been validated in all clinical situations.  eGFR's persistently <90 mL/min signify possible Chronic Kidney     Disease.   Anion gap 10  5 - 15     HEENT: normal Cardio: RRR Resp: CTA B/L GI: BS positive Extremity:  Pulses positive and No Edema Skin:   Intact Neuro: Alert/Oriented, Cranial Nerve II-XII normal, Normal Sensory, Normal Motor and Other bilateral upper ext tremor Musc/Skel:  Normal Gen NAD Romberg positive falls backward  Assessment/Plan: 1. Functional deficits secondary to Left anterior frontal infarct  which require 3+ hours per day of interdisciplinary therapy in a comprehensive inpatient rehab setting. Physiatrist is providing close team supervision and 24 hour management of active medical problems listed below. Physiatrist and rehab team continue to assess barriers to discharge/monitor patient progress toward functional and medical goals. FIM: FIM - Bathing Bathing Steps Patient Completed: Chest;Right Arm;Left Arm;Abdomen;Buttocks;Right upper leg;Left upper leg Bathing: 3: Mod-Patient completes 5-7 31f10 parts or 50-74%  FIM - Upper Body Dressing/Undressing Upper body dressing/undressing steps patient completed: Thread/unthread right sleeve of pullover shirt/dresss;Thread/unthread  left sleeve of pullover shirt/dress;Put head through opening of pull over shirt/dress;Pull shirt over trunk Upper body dressing/undressing: 5: Set-up assist to: Obtain clothing/put away FIM - Lower Body Dressing/Undressing Lower body dressing/undressing steps patient completed: Thread/unthread right underwear leg;Thread/unthread left underwear leg;Pull underwear up/down;Thread/unthread right pants leg;Thread/unthread left pants leg;Pull pants up/down;Don/Doff right sock;Don/Doff left sock Lower body dressing/undressing: 4: Steadying Assist  FIM - Toileting Toileting steps completed by patient: Adjust clothing prior to toileting;Performs perineal hygiene;Adjust clothing after toileting Toileting Assistive Devices: Grab bar or rail for support Toileting: 4: Steadying assist     FIM - BControl and instrumentation engineerDevices: Bed rails Bed/Chair Transfer: 5: Supine > Sit: Supervision (verbal cues/safety issues);5: Sit > Supine: Supervision (verbal cues/safety issues);4: Bed > Chair or W/C: Min A (steadying Pt. > 75%);4: Chair or W/C > Bed: Min A (steadying Pt. > 75%)  FIM - Locomotion: Ambulation Locomotion: Ambulation Assistive Devices: Other (comment) (no A/D) Ambulation/Gait Assistance: 4: Min assist Locomotion: Ambulation: 2: Travels 50 - 149 ft with minimal assistance (Pt.>75%)  Comprehension Comprehension Mode: Auditory Comprehension: 5-Follows basic conversation/direction: With no assist  Expression Expression Mode: Verbal Expression: 6-Expresses complex ideas: With extra time/assistive device  Social Interaction Social Interaction: 6-Interacts appropriately with others with medication or extra time (anti-anxiety, antidepressant).  Problem Solving Problem Solving: 6-Solves complex problems: With extra time  Memory Memory: 6-More than reasonable amt of time  Medical Problem List and Plan:  1. Functional deficits secondary to thrombotic left frontal infarct   2. DVT Prophylaxis/Anticoagulation: Subcutaneous Lovenox. Monitor platelet counts any signs of bleeding  3. Pain Management: Oxycodone 5 mg every 4 as needed moderate pain. Monitor with increased mobility  4. Mood/anxiety/depression. Klonopin 1 mg twice a day . Provide emotional support: -will consult neuropsych for depression and coping skills. He reports battling depression for some time, rec outpt f/u for this 5. Neuropsych: This patient is capable of making decisions on his own behalf.  6. Hypertension. Prinzide 20-12.5 mg daily, Norvasc 10 mg daily. Monitor with increased mobility  7. History of alcohol abuse. Monitor for any signs of withdrawal. Provide counseling on floor  8. Hyperlipidemia. Lipitor  9. Bilateral maxillary sinus disease. Complete course of Ceftin initiated 07/06/2014 x2 weeks  10. History of prostate cancer. Continue Flomax.    LOS (Days) 2 A FACE TO FACE EVALUATION WAS PERFORMED  Joshua Henson E 07/09/2014, 7:40 AM

## 2014-07-09 NOTE — Progress Notes (Signed)
Social Work Patient ID: Joshua Henson, male   DOB: 02-19-1946, 68 y.o.   MRN: [id number] Met with pt and girlfriend to discuss team conference goals-mod/i-supervision level and discharge 7/25.  Both pleased with MD contacting Dr. Elwyn Reach to see if appointment can Be moved up.  Agreeable to OPPT and DME needs.  Girlfriend can be there with pt, but would like to know for how long.  Addressed the letter [name] has given to therapy team and MD. Her main concern is that the issues they feel are being addressed.  Aware Neuro-psych to see today and wants to await recommendation for follow up from them.  Both relieved issues being addressed And hope his dizziness will resolve after such a long period of time he has been dealing with this issue.  Continue to work on discharge needs for Sat.

## 2014-07-09 NOTE — Progress Notes (Addendum)
Speech Language Pathology Daily Session Note  Patient Details  Name: Joshua Henson MRN: [id number] Date of Birth: 1946/06/13  Today's Date: 07/09/2014 Time: 1401-1501 Time Calculation (min): 60 min  Short Term Goals: Week 1: SLP Short Term Goal 1 (Week 1): Pt will improve semi-complex problem solving for medication and financial management for 80% accuracy wtih min assist-supervision  SLP Short Term Goal 2 (Week 1): Pt will improve safety awareness during functional tasks for 80% accuracy with supervision  SLP Short Term Goal 3 (Week 1): Pt will identify at least 1 cognitive impairment as well as its impact on his functional independence at home with supervision question cues.   Skilled Therapeutic Interventions:  Pt was seen for skilled speech therapy targeting safety awareness and semi-complex problem solving.  SLP facilitated session with a semi-complex medication management task targeting planning, thought organization, and error awareness.  Pt was 100% accurate for loading pills into a pill box with modified independence following initial supervision instructional cues.  Additionally, pt was >80% accurate for functional math calculations and numerical reasoning during a structured financial management task with the use of a calculator and supervision-min assist verbal cues for error awareness.  Pt required moderate encouragement to participate in structured activities throughout session and frequently attempted to evade tasks by saying things like "I've never been good at this," "I've never been organized," or "That's now how I've done it before," and benefited from explanation of rationale behind skilled speech therapy activities for facilitating return to previous level of function.  Once returned to room, pt requested to sit in recliner and exhibited decreased safety awareness during functional transfers as he attempted to move from wheelchair to recliner without removing foot pedals.  Pt lost  his balance and required mod assist for use of safety precautions during further mobility.  Continue per current plan of care.    FIM:  Comprehension Comprehension Mode: Auditory Comprehension: 6-Follows complex conversation/direction: With extra time/assistive device Expression Expression Mode: Verbal Expression: 6-Expresses complex ideas: With extra time/assistive device Social Interaction Social Interaction: 5-Interacts appropriately 90% of the time - Needs monitoring or encouragement for participation or interaction. Problem Solving Problem Solving: 5-Solves complex 90% of the time/cues < 10% of the time Memory Memory: 5-Recognizes or recalls 90% of the time/requires cueing < 10% of the time  Pain Pain Assessment Pain Assessment: No/denies pain  Therapy/Group: Individual Therapy  Windell Moulding, M.A. CCC-SLP  Keidy Thurgood, Selinda Orion 07/09/2014, 4:22 PM

## 2014-07-09 NOTE — Progress Notes (Signed)
Occupational Therapy Session Note  Patient Details  Name: Joshua Henson MRN: [id number] Date of Birth: June 14, 1946  Today's Date: 07/09/2014 Time: 0732-0812 and 1332-1400 Time Calculation (min): 40 min and 28 min  Short Term Goals: Week 1:  OT Short Term Goal 1 (Week 1): STG = LTGs due to ELOS  Skilled Therapeutic Interventions/Progress Updates:    1) Engaged in self-care retraining with focus on dynamic sitting and standing balance during self-care tasks of bathing and dressing. Pt ambulated to room shower with min/steady assist secondary to unsteady/ataxic like gait.  Educated pt on sitting on shower chair for safety especially while washing BLE, however pt stood in shower and placed foot on bench to wash.  Noted pt to have heavy use of grab bars in shower during standing, and very hesitant to intervention from therapist.  After verbal cues, pt sat to don underwear and shorts.  Demonstrated LOB backwards in standing after pulling up pants which he was unable to correct resulting in him sitting down on chair.  Pt relies heavily on vision for balance during static standing, therefore recommending pt sit with bathing especially when washing hair.  Completed grooming tasks in standing at sink with close supervision.  Pt continues to be preoccupied with possible vestibular issues and is worried that he won't be "fixed".  2) Pt reports being shaken up by poor balance during AM sessions but that PT performed vestibular eval and helped his balance to which he is feeling better this session. Ambulated to toilet without AD with close supervision, pt stood to urinate without any assistance for toileting.  Ambulated in gift shop to challenge balance with increase in visual stimulus, smaller halls and aisles, busy environment, and multiple turns.  Pt with min guard throughout gift shop.  Pt able to recall gaze stabilization and eye/head/body turning to improve balance and decrease dizziness with ambulation as  instructed from AM PT session.  Therapy Documentation Precautions:  Precautions Precautions: Fall Precaution Comments: multiple syncopal episodes Restrictions Weight Bearing Restrictions: No Pain:  Pt with no c/o pain  See FIM for current functional status  Therapy/Group: Individual Therapy  Simonne Come 07/09/2014, 10:10 AM

## 2014-07-09 NOTE — Progress Notes (Addendum)
Physical Therapy Vestibular Assessment and Plan  Patient Details  Name: Joshua Henson MRN: [id number] Date of Birth: August 07, 1946  PT Diagnosis: BPPV  Today's Date: 07/09/2014 Time: 4665-9935 Time Calculation (min): 40 min  Problem List:  Patient Active Problem List   Diagnosis Date Noted  . CVA (cerebral infarction) 07/07/2014  . Unspecified cerebral artery occlusion with cerebral infarction 07/05/2014  . Syncope 07/02/2014  . Syncope and collapse 07/02/2014  . Scalp laceration 07/02/2014  . Alcohol abuse 07/02/2014  . Orthostatic hypotension 07/02/2014  . Hearing loss 11/25/2013  . Tinnitus of both ears 11/25/2013   Past Medical History:  Past Medical History  Diagnosis Date  . Prostate cancer 2010    External beam radiation (urol - Risa Grill, XRT Valere Dross)  . Stroke   . Depression    Past Surgical History:  Past Surgical History  Procedure Laterality Date  . None     Subjective: Pt's spouse reports onset of aural fullness and left-sided hearing loss in 10/2013.  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  Pt struck head on countertop on 07/02/14 during syncopal episode just prior to current admission. Per acute care physical therapist, pt presented with R BPPV Posterior Canalithiasis and was treated with R Canalith Repositioning Maneuver in acute care. Per pt, symptoms resolved following treatment but have since returned gradually. Pt reports perception of "spinning" sensation when turning head in bed (pt unsure of which direction) and immediately after getting out of bed in morning. Pt unable to recall durations of symptoms.   Vestibular Evaluation Eye Alignment WNL  Spontaneous  Nystagmus WNL  Gaze holding nystagmus Not observed  Smooth pursuit Grossly [facility]  Oculomotor Not formally assessed   Saccades Not formally assessed  VOR slow Grossly WFL  Head Thrust Test (+) on L side  Head Shaking Nystagmus Not formally assessed  Rt. Hallpike Dix (-)  Lt.  Hallpike Dix (+) for L upbeating torsional nystagmus x10 seconds  Rt. Roll Test Not tested  Lt. Roll Test  Not tested  Motion sensitivity Not observed   Visual- Vestibular Interactions:  - Sitting: Not significant - Standing: Increased A/P sway with static standing without UE support - Tandem: Increased lateral trunk lean to L side - Walking: staggering gait pattern; more prominent with bilat turning and with fatigue (gait >100' without assistive device).  Findings L upbeating torsional nystagmus (duration of 10 seconds) with L Hallpike Dix consistent with Left BPPV Posterior Canalithiasis.  Skilled Therapeutic Intervention Performed L Occupational psychologist (CRM) x2 during this session. Pt with nystagmus and vertiginous symptoms as described above (L Hallpike Test) during initial L CRM; no symptoms or nystagmus noted during subsequent L CRM. Educated pt on post-CRM precaution to avoid cervical spine extension for 1 hour following treatment. Pt instructed to resume normal activity/positoning after 1 hour. Pt verbalized understanding.  Per acute care physical therapist, no change in symptoms with performance of VOR exercises. Therefore, educated pt on compensatory strategies for impaired VOR, including strategy for first moving eyes, then head, then body in direction of turning.  Also educated pt on use of visual target during dynamic standing and gait to decrease symptoms of disequilibrium and improve gait stability. With visual targeting, pt demonstrated improved gait stability but reported no change in perception of disequilibrium/imbalance.   Pt expressing confusion regarding relationship between current CVA and ongoing dizziness/imbalance. Per report of spouse that pt has been diagnosed with endolymphatic hydrops [facility]), educated pt on physiology of [place] and occurrence of BPPV  with EH. Utilized visual aid and paper handout for carryover of information. Pt verbalized understanding.    Plan 1. Monitor pt symptoms and response to treatment. Pt reported resolve of vertiginous symptoms immediately following CRM and when seen 7 hours after CRM. 2. Compensation: will continue to reinforce use of compensatory strategies to increase pt safety with functional mobility. Pending pt progress, will also consider use of assistive device. 3. Adaptation: when safety with functional mobility improves, will consider attempting use of adaptation exercises to help the nervous system adapt to loss of vestibular input.  Precautions/Restrictions  Fall General   Vital SignsTherapy Vitals Temp: 98.6 F (37 C) Temp src: Oral Pulse Rate: 91 Resp: 18 BP: 118/78 mmHg Patient Position (if appropriate): Lying Oxygen Therapy SpO2: 95 % O2 Device: None (Room air) Pain Pain Assessment Pain Assessment: No/denies pain  Glori Machnik, Malva Cogan 07/09/2014, 4:41 PM

## 2014-07-09 NOTE — Patient Care Conference (Signed)
Inpatient RehabilitationTeam Conference and Plan of Care Update Date: 07/09/2014   Time: 10;45 Am    Patient Name: Joshua Henson      Medical Record Number: [id number]  Date of Birth: [birth year] Sex: Male         Room/Bed: 4M04C/4M04C-01 Payor Info: Payor: MEDICARE / Plan: MEDICARE PART A AND B / Product Type: *No Product type* /    Admitting Diagnosis: CVA NIH  Admit Date/Time:  07/07/2014  3:58 PM Admission Comments: No comment available   Primary Diagnosis:  <principal problem not specified> Principal Problem: <principal problem not specified>  Patient Active Problem List   Diagnosis Date Noted  . CVA (cerebral infarction) 07/07/2014  . Unspecified cerebral artery occlusion with cerebral infarction 07/05/2014  . Syncope 07/02/2014  . Syncope and collapse 07/02/2014  . Scalp laceration 07/02/2014  . Alcohol abuse 07/02/2014  . Orthostatic hypotension 07/02/2014  . Hearing loss 11/25/2013  . Tinnitus of both ears 11/25/2013    Expected Discharge Date: Expected Discharge Date: 07/12/14  Team Members Present: Physician leading conference: Dr. Alysia Penna Social Worker Present: Ovidio Kin, LCSW Nurse Present: Elliot Cousin, RN PT Present: Georjean Mode, PT;Blair Hobble, PT OT Present: Simonne Come, OT SLP Present: Windell Moulding, SLP PPS Coordinator present : Ileana Ladd, Lelan Pons, RN, CRRN     Current Status/Progress Goal Weekly Team Focus  Medical   Acute care Right benign positional vertigo, rehab Left BPPV, cognitive deficits with poor awareness  arrange outpt f/u ENT, Psych, PT/OT  see above   Bowel/Bladder   Continent of bowel and bladder. LBM 07/08/2014.  Remain continent of bowel and bladder.  Monitor for any s/s of diarrhea/constipation.   Swallow/Nutrition/ Hydration     WFL        ADL's   min assist overall  Mod I  safety awareness, balance, family education   Mobility   Min A overall  Mod I  Postural/gait stability, dynamic standing balance,  address vestibular symptoms, patient/family education   Communication     [facility]        Safety/Cognition/ Behavioral Observations  min assist-supervision   Mod I   education for compensatory strategies, medication and financial management tasks.     Pain   n/a  Pain level 3 or less on a scale of 0-10.  Monitor for any onset of pain.   Skin   Laceration to left elbow with tegaderm, bruises to both arm. Ecchymosis to abdomen left and right. Abrasion to bil. knee-healed.  No new skin breakdown/ infection.  Assess skin q shift.      *See Care Plan and progress notes for long and short-term goals.  Barriers to Discharge: denial of cognitive deficits, hx ETOH    Possible Resolutions to Barriers:  See above    Discharge Planning/Teaching Needs:  Home with grilfriend providing care for short time-can work from home.  She wants an ENT to see before d/c      Team Discussion:  Adjusted meds for sleep and depression.  Vestibular eval today.  Neuro-psych to see today.  Multifactorial problem which will take time to resolve.  MD to contact Dr. Elwyn Reach regarding earlier appt  Revisions to Treatment Plan:  Downgraded goals-supervision level-safety concerns   Continued Need for Acute Rehabilitation Level of Care: The patient requires daily medical management by a physician with specialized training in physical medicine and rehabilitation for the following conditions: Daily direction of a multidisciplinary physical rehabilitation program to ensure safe treatment while eliciting the highest  outcome that is of practical value to the patient.: Yes Daily medical management of patient stability for increased activity during participation in an intensive rehabilitation regime.: Yes Daily analysis of laboratory values and/or radiology reports with any subsequent need for medication adjustment of medical intervention for : Neurological problems;Other  [name], [name] 07/09/2014, 3:10 PM

## 2014-07-10 ENCOUNTER — Inpatient Hospital Stay (HOSPITAL_COMMUNITY): Payer: Medicare Other | Admitting: Speech Pathology

## 2014-07-10 ENCOUNTER — Encounter (HOSPITAL_COMMUNITY): Payer: Medicare Other | Admitting: Occupational Therapy

## 2014-07-10 ENCOUNTER — Inpatient Hospital Stay (HOSPITAL_COMMUNITY): Payer: Medicare Other | Admitting: Physical Therapy

## 2014-07-10 DIAGNOSIS — Z5189 Encounter for other specified aftercare: Secondary | ICD-10-CM

## 2014-07-10 DIAGNOSIS — F1010 Alcohol abuse, uncomplicated: Secondary | ICD-10-CM

## 2014-07-10 DIAGNOSIS — I6330 Cerebral infarction due to thrombosis of unspecified cerebral artery: Secondary | ICD-10-CM

## 2014-07-10 NOTE — Progress Notes (Signed)
Occupational Therapy Session Note  Patient Details  Name: AJ CRUNKLETON MRN: [id number] Date of Birth: September 08, 1946  Today's Date: 07/10/2014 Time: 0930-1025 Time Calculation (min): 55 min  Short Term Goals: Week 1:  OT Short Term Goal 1 (Week 1): STG = LTGs due to ELOS  Skilled Therapeutic Interventions/Progress Updates:    Engaged in ADL retraining with focus on carryover of education regarding safety during self-care tasks and head turning during ambulation and turns to increase balance and postural control.  Verbal cues for safety with w/c brakes and appropriate use of RW as pt will leave it to side and ambulate short distance to chair without it.  Discussed fall risk in shower with eyes closed, recommending use of shower chair especially when washing hair and LB.  Pt able to follow initial cue and was distant supervision during shower this session at seated level with exception of standing to wash buttocks.  Pt ambulated short distances in room without RW with close supervision/min guard.  Ambulated to therapy gym with RW with supervision.  Engaged in balance activities with focus on symmetrical stance and Random Control.  Pt with tendency of weight distribution posterior and to Lt.  Completed obstacle course with verbal cues to slow down and maintain proper positioning of RW.  Pt required min assist initially, and post further education during 2nd attempt pt was supervision.  Recommend pt have grab bar and shower seat in shower at d/c.    Therapy Documentation Precautions:  Precautions Precautions: Fall Precaution Comments: multiple syncopal episodes Restrictions Weight Bearing Restrictions: No Pain:  Pt with no c/o pain  See FIM for current functional status  Therapy/Group: Individual Therapy  Simonne Come 07/10/2014, 10:50 AM

## 2014-07-10 NOTE — Progress Notes (Signed)
68 y.o. right-handed male with history of prostate cancer status post radiation as well as alcohol abuse. 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 Alcohol level 155 upon admission. MRI of the brain shows punctate nonhemorrhagic infarct involving the anterior left frontal lobe, remote 26m left frontal lobe white matter infarct as well as bilateral maxillary sinus disease. Cardiac enzymes negative. 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 rate 17, height _0  (1.778 m), weight 92.352 kg (203 lb 9.6 oz), SpO2 94.00%. No results found. Results for orders placed during the hospital encounter of 07/07/14 (from the past 72 hour(s))  CBC WITH DIFFERENTIAL     Status: Abnormal   Collection Time    07/08/14  5:32 AM      Result Value Ref Range   WBC 5.0  4.0 - 10.5 K/uL   RBC 3.37 (*) 4.22 - 5.81 MIL/uL   Hemoglobin 12.8 (*) 13.0 - 17.0 g/dL   HCT 34.6 (*) 39.0 - 52.0 %   MCV 102.7 (*) 78.0 - 100.0 fL   MCH 38.0 (*) 26.0 - 34.0 pg   MCHC 37.0 (*) 30.0 - 36.0 g/dL   RDW 14.0  11.5 - 15.5 %   Platelets 185  150 - 400 K/uL   Neutrophils Relative % 63  43 - 77 %   Neutro Abs 3.2  1.7 - 7.7 K/uL   Lymphocytes Relative 16  12 - 46 %   Lymphs Abs 0.8  0.7 - 4.0 K/uL   Monocytes  Relative 18 (*) 3 - 12 %   Monocytes Absolute 0.9  0.1 - 1.0 K/uL   Eosinophils Relative 2  0 - 5 %   Eosinophils Absolute 0.1  0.0 - 0.7 K/uL   Basophils Relative 1  0 - 1 %   Basophils Absolute 0.0  0.0 - 0.1 K/uL  COMPREHENSIVE METABOLIC PANEL     Status: Abnormal   Collection Time    07/08/14  5:32 AM      Result Value Ref Range   Sodium 139  137 - 147 mEq/L   Potassium 3.7  3.7 - 5.3 mEq/L   Chloride 103  96 - 112 mEq/L   CO2 26  19 - 32 mEq/L   Glucose, Bld 103 (*) 70 - 99 mg/dL   BUN 10  6 - 23 mg/dL   Creatinine, Ser 0.71  0.50 - 1.35 mg/dL   Calcium 9.1  8.4 - 10.5 mg/dL   Total Protein 6.6  6.0 - 8.3 g/dL   Albumin 3.3 (*) 3.5 - 5.2 g/dL   AST 54 (*) 0 - 37 U/L   ALT 51  0 - 53 U/L   Alkaline Phosphatase 79  39 - 117 U/L   Total Bilirubin 0.7  0.3 - 1.2 mg/dL   GFR calc  non Af Amer >90  >90 mL/min   GFR calc Af Amer >90  >90 mL/min   Comment: (NOTE)     The eGFR has been calculated using the CKD EPI equation.     This calculation has not been validated in all clinical situations.     eGFR's persistently <90 mL/min signify possible Chronic Kidney     Disease.   Anion gap 10  5 - 15  TSH     Status: Abnormal   Collection Time    07/09/14  2:00 PM      Result Value Ref Range   TSH 4.510 (*) 0.350 - 4.500 uIU/mL  RPR     Status: None   Collection Time    07/09/14  2:00 PM      Result Value Ref Range   RPR NON REAC  NON REAC   Comment: Performed at [place]: normal Cardio: RRR Resp: CTA B/L GI: BS positive Extremity:  Pulses positive and No Edema Skin:   Intact Neuro: Alert/Oriented, Cranial Nerve II-XII normal, Normal Sensory, Normal Motor and Other bilateral upper ext tremor Musc/Skel:  Normal Gen NAD Ext- feet with mild intrinsic atrophy  Assessment/Plan: 1. Functional deficits secondary to Left anterior frontal infarct  which require 3+ hours per day of interdisciplinary therapy in a comprehensive inpatient rehab  setting. Physiatrist is providing close team supervision and 24 hour management of active medical problems listed below. Physiatrist and rehab team continue to assess barriers to discharge/monitor patient progress toward functional and medical goals. Discussed my conversation with ENT, will be able to move up appt to August, discussed need to stop ETOH, caffeine, benzos 2-5 days prior to Electronystagmogram (to be done in ENT office, no longer performed in hosp) FIM: FIM - Bathing Bathing Steps Patient Completed: Chest;Right Arm;Left Arm;Abdomen;Buttocks;Right upper leg;Left upper leg;Front perineal area;Right lower leg (including foot);Left lower leg (including foot) Bathing: 4: Steadying assist  FIM - Upper Body Dressing/Undressing Upper body dressing/undressing steps patient completed: Thread/unthread right sleeve of pullover shirt/dresss;Thread/unthread left sleeve of pullover shirt/dress;Put head through opening of pull over shirt/dress;Pull shirt over trunk Upper body dressing/undressing: 5: Supervision: Safety issues/verbal cues FIM - Lower Body Dressing/Undressing Lower body dressing/undressing steps patient completed: Thread/unthread right underwear leg;Thread/unthread left underwear leg;Pull underwear up/down;Thread/unthread right pants leg;Thread/unthread left pants leg;Pull pants up/down;Don/Doff right sock;Don/Doff left sock Lower body dressing/undressing: 4: Steadying Assist  FIM - Toileting Toileting steps completed by patient: Adjust clothing prior to toileting;Performs perineal hygiene;Adjust clothing after toileting Toileting Assistive Devices: Grab bar or rail for support Toileting: 4: Steadying assist     FIM - Control and instrumentation engineer Devices: Bed rails Bed/Chair Transfer: 5: Supine > Sit: Supervision (verbal cues/safety issues);5: Sit > Supine: Supervision (verbal cues/safety issues);4: Bed > Chair or W/C: Min A (steadying Pt. > 75%);4: Chair or  W/C > Bed: Min A (steadying Pt. > 75%)  FIM - Locomotion: Wheelchair Locomotion: Wheelchair: 0: Activity did not occur (Pt ambulatory on unit) FIM - Locomotion: Ambulation Locomotion: Ambulation Assistive Devices: Other (comment) (no AD) Ambulation/Gait Assistance: 4: Min assist;4: Min guard Locomotion: Ambulation: 4: Travels 150 ft or more with minimal assistance (Pt.>75%)  Comprehension Comprehension Mode: Auditory Comprehension: 6-Follows complex conversation/direction: With extra time/assistive device  Expression Expression Mode: Verbal Expression: 6-Expresses complex ideas: With extra time/assistive device  Social Interaction Social Interaction: 5-Interacts appropriately 90% of the time - Needs monitoring or encouragement for participation or interaction.  Problem Solving Problem Solving: 5-Solves complex  90% of the time/cues < 10% of the time  Memory Memory: 5-Recognizes or recalls 90% of the time/requires cueing < 10% of the time  Medical Problem List and Plan:  1. Functional deficits secondary to thrombotic left frontal infarct  2. DVT Prophylaxis/Anticoagulation: Subcutaneous Lovenox. Monitor platelet counts any signs of bleeding  3. Pain Management: Oxycodone 5 mg every 4 as needed moderate pain. Monitor with increased mobility  4. Mood/anxiety/depression. Ativan 1 mg 3 a day . Provide emotional support: -will consult neuropsych for depression and coping skills. He reports battling depression for some time, rec outpt f/u for this 5. Neuropsych: This patient is capable of making decisions on his own behalf.  6. Hypertension. Prinzide 20-12.5 mg daily, Norvasc 10 mg daily. Monitor with increased mobility  7. History of alcohol abuse. Monitor for any signs of withdrawal. Provide counseling on floor  8. Hyperlipidemia. Lipitor  9. Bilateral maxillary sinus disease. Complete course of Ceftin initiated 07/06/2014 x2 weeks  10. History of prostate cancer. Continue Flomax.  11.  Gait imbalance/vertigo, BPPV improved vertigo resolved now has gait imbalance (+romberg), which may be multifactorial but further w/u pend 12.  Depression started on remeron LOS (Days) 3 A FACE TO FACE EVALUATION WAS PERFORMED  Angelos Wasco E 07/10/2014, 7:25 AM

## 2014-07-10 NOTE — Progress Notes (Signed)
Physical Therapy Session Note  Patient Details  Name: Joshua Henson MRN: [id number] Date of Birth: 02-Mar-1946  Today's Date: 07/10/2014 Time: [phone] and [id number] Time Calculation (min): 58 min and 31 min  Short Term Goals: Week 1:  STG's=LTG's secondary to ELOS  Skilled Therapeutic Interventions/Progress Updates:    Treatment Session 1: Pt received semi reclined in bed; agreeable to therapy. Session focused on assessing pt response to CRM and increasing pt stability/independence with gait, car transfer, and floor transfer.  Reassessed bilat Dix-Hallpike, which was (-) and asymptomatic on left. Right Dix-Hallpike symptomatic and revealed R upbeating torsional nystagmus x8 seconds with 2-second latency consistent with R BPPV posterior canalithiasis. Based on findings, pt treated pt with R canolith repositioning maneuver (CRM). Pt reporting resolved symptoms after CRM. Will continue to assess pt symptoms and response to treatment.  Transitioned to gait 2x200' in controlled, home, and community environments with rolling walker and supervision. Performed car transfer with rolling walker and supervision, increased time, and min verbal cueing for safe hand placement. In rehab apartment, performed furniture transfer x2 with rolling walker and supervision. Educated pt on fall recovery, situations in which pt should activate EMS after fall. Explained, demonstrated floor transfer (seated on couch<>long sitting on floor). Pt gave effective return demonstration of floor transfer, which pt performed with supervision. Session ended in pt room, where pt was left seated in w/c with all needs within reach.  Treatment Session 2: Pt received seated EOB with spouse present. Pt agreeable to therapy. Session focused on pt/family education and hands-on family training. Initial 10 minutes focused on patient/family education concerning recommendations that spouse provide supervision with all functional mobility and that  pt utilize rolling walker for all functional mobility. Per question of spouse, recommended that spouse provide R HHA for mobility when walker does not fit through doorways or when ground is uneven. Explained that outpatient PT will progress pt and notify pt/spouse when pt safe to perform mobility without assistive device. Also verbalized recommendation for outpatient vestibular rehabilitation to address current gait instability, disequilibrium and future episodes of BPPV if necessary. Pt/spouse verbalized understanding of all described education.  Therapist explained and demonstrated safe technique for supervision/assist and appropriate cueing with all of the following: gait x150' with rolling walker and supervision; gait x75' in crowded environment with min A, R HHA for effective obstacle negotiation; car transfer with rolling walker and supervision, cueing for safe hand placement; negotiation of 3 stairs x2 reps with R rail and step-to pattern requiring supervision, cueing for technique. Spouse gave effective return demonstration of supervision, assist, and cueing with all aspects of functional mobility described above. Pt/spouse both express feeling comfortable with functional mobility upon D/C home. Therapist departed with pt semi reclined in bed with 2 bed rails up, bed alarm on, and all needs within reach.  Therapy Documentation Precautions:  Precautions Precautions: Fall Precaution Comments: multiple syncopal episodes Restrictions Weight Bearing Restrictions: No General:   Vital Signs: Therapy Vitals Temp: 98.7 F (37.1 C) Temp src: Oral Pulse Rate: 70 Resp: 18 BP: 137/89 mmHg Patient Position (if appropriate): Lying Oxygen Therapy SpO2: 91 % O2 Device: None (Room air) Pain:  Pt reports no pain during AM and PM physical therapy sessions. Locomotion : Ambulation Ambulation/Gait Assistance: 5: Supervision   See FIM for current functional status  Therapy/Group: Individual  Therapy  Devontae Casasola, Malva Cogan 07/10/2014, 4:01 PM

## 2014-07-10 NOTE — Progress Notes (Signed)
Speech Language Pathology Discharge Summary  Patient Details  Name: Joshua Henson MRN: [id number] Date of Birth: 29-Nov-1946  Today's Date: 07/10/2014 Time: [id number] Time Calculation (min): 45 min  Skilled Therapeutic Interventions:  Pt was seen for skilled speech therapy targeting safety awareness and executive function.  SLP engaged pt in a complex scavenger hunt targeting planning, thought organization, working memory and safety awareness while ambulating around the unit.  Pt required min faded to supervision level cues to complete task in addition to moderate encouragement for participation as pt continues to evade tasks.  Per report from pt's girlfriend, pt is at baseline cognitive function for safety awareness and problem solving.  Pt's girlfriend will be available for 24/7 supervision so no further speech needs are indicated at this time.  SLP completed extensive education related to safety precautions, assistance for medication and financial management at discharge, and supervision for safety with ambulation tasks.    Patient has met  0 of 3 long term goals.  Patient to discharge at overall Supervision level.  Reasons goals not met: pt at supervision level, returned to baseline cognitive function per report obtained since initial eval, girlfriend available to provide 24/7 supervision at discharge    Clinical Impression/Discharge Summary:  Pt presents with mild cognitive impairments for safety awareness which, per report obtained since initial eval, is indicative of pt's baseline cognitive function requiring supervision.  0 out of 3 long term goals were met due to being set at mod I level and pt is currently at an overall supervision level of assist for cognitive task.  Pt will discharge home with 24/7 supervision from his girlfriend who was providing this level of assist prior to admission.  Education is complete at this time and no further speech therapy services are indicated given that pt is  not significantly altered from his baseline.    Care Partner:  Caregiver Able to Provide Assistance: Yes  Type of Caregiver Assistance: Physical;Cognitive  Recommendation:  Other (comment) (intermittent supervision )      Equipment: none recommended by SLP   Reasons for discharge: Other (returned to baseline cognitive function per report, adequate supervision available at discharge)   Patient/Family Agrees with Progress Made and Goals Achieved: Yes   See FIM for current functional status  Windell Moulding, M.A. CCC-SLP  Elija Mccamish, Selinda Orion 07/10/2014, 4:58 PM

## 2014-07-10 NOTE — IPOC Note (Signed)
Overall Plan of Care [facility]) Patient Details Name: Joshua Henson MRN: [id number] DOB: 04-14-1946  Admitting Diagnosis: CVA NIH  Hospital Problems: Active Problems:   CVA (cerebral infarction)     Functional Problem List: Nursing Bladder;Bowel;Medication Management;Endurance;Nutrition;Pain;Safety;Skin Integrity  PT Balance;Motor;Perception;Safety;Other (comment) (Vestibular)  OT Balance;Motor;Perception;Safety  SLP Cognition;Safety  TR         Basic ADL's: OT Grooming;Bathing;Dressing;Toileting     Advanced  ADL's: OT Simple Meal Preparation     Transfers: PT Bed Mobility;Bed to Chair;Car;Furniture;Floor  OT Toilet;Tub/Shower     Locomotion: PT Ambulation;Stairs     Additional Impairments: OT None  SLP        TR      Anticipated Outcomes Item Anticipated Outcome  Self Feeding independent  Swallowing      Basic self-care  mod I  Toileting  mod I   Bathroom Transfers mod I  Bowel/Bladder  Pt. will be continent of bladder and bowel  Transfers  Mod I  Locomotion  Supervision - Mod I  Communication     Cognition  Mod I   Pain  Pain level 2 on scale 1 to 10  Safety/Judgment  Pt. will be free from falls   Therapy Plan: PT Intensity: Minimum of 1-2 x/day ,45 to 90 minutes PT Frequency: 5 out of 7 days PT Duration Estimated Length of Stay: 7-10 days OT Intensity: Minimum of 1-2 x/day, 45 to 90 minutes OT Frequency: 5 out of 7 days OT Duration/Estimated Length of Stay: 7-10 days SLP Intensity: Minumum of 1-2 x/day, 30 to 90 minutes SLP Frequency: 5 out of 7 days SLP Duration/Estimated Length of Stay: 7 days        Team Interventions: Nursing Interventions Patient/Family Education;Bladder Management;Bowel Management;Disease Management/Prevention;Pain Management;Medication Management;Skin Care/Wound Management  PT interventions Ambulation/gait training;Balance/vestibular training;Cognitive remediation/compensation;Discharge planning;Neuromuscular  re-education;Functional mobility training;DME/adaptive equipment instruction;Disease management/prevention;Patient/family education;UE/LE Coordination activities;Therapeutic Exercise;Therapeutic Activities;Stair training;Visual/perceptual remediation/compensation  OT Interventions Balance/vestibular training;Community reintegration;Discharge planning;Disease mangement/prevention;DME/adaptive equipment instruction;Functional mobility training;Neuromuscular re-education;Pain management;Patient/family education;Psychosocial support;Self Care/advanced ADL retraining;Therapeutic Activities;Therapeutic Exercise  SLP Interventions Cognitive remediation/compensation;Environmental controls;Internal/external aids;Patient/family education;Functional tasks;Cueing hierarchy  TR Interventions    SW/CM Interventions Discharge Planning;Psychosocial Support;Patient/Family Education    Team Discharge Planning: Destination: PT-  ,OT- Home , SLP-Home Projected Follow-up: PT-Outpatient PT;Other (comment) (Vestibular Rehab), OT-  Outpatient OT, SLP-Other (comment) (ST follow up recs TBD pending progress made in house, anticipate need for intermittent supervision ) Projected Equipment Needs: PT-To be determined, OT- Tub/shower seat, SLP-None recommended by SLP Equipment Details: PT-Pt owns no personal assistive devices, OT-  Patient/family involved in discharge planning: PT- Patient,  OT-Patient, SLP-Patient  MD ELOS: 7d Medical Rehab Prognosis:  Excellent Assessment: 68 y.o. right-handed male with history of prostate cancer status post radiation as well as alcohol abuse. 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 Alcohol level 155 upon admission. MRI of the brain shows punctate nonhemorrhagic infarct involving the anterior left frontal lobe, remote 34mm left  frontal lobe white matter infarct as well as bilateral maxillary sinus disease. Cardiac enzymes negative. Patient did not receive TPA   Now requiring 24/7 Rehab RN,MD, as well as CIR level PT, OT and SLP.  Treatment team will focus on ADLs and mobility, balance with goals set at Mod I/Sup   See Team Conference Notes for weekly updates to the plan of care

## 2014-07-11 ENCOUNTER — Encounter (HOSPITAL_COMMUNITY): Payer: Medicare Other | Admitting: Occupational Therapy

## 2014-07-11 ENCOUNTER — Ambulatory Visit (HOSPITAL_COMMUNITY): Payer: Medicare Other | Admitting: Physical Therapy

## 2014-07-11 ENCOUNTER — Inpatient Hospital Stay (HOSPITAL_COMMUNITY): Payer: Medicare Other | Admitting: Physical Therapy

## 2014-07-11 DIAGNOSIS — I6330 Cerebral infarction due to thrombosis of unspecified cerebral artery: Secondary | ICD-10-CM

## 2014-07-11 DIAGNOSIS — F1010 Alcohol abuse, uncomplicated: Secondary | ICD-10-CM

## 2014-07-11 DIAGNOSIS — Z5189 Encounter for other specified aftercare: Secondary | ICD-10-CM

## 2014-07-11 LAB — B. BURGDORFI ANTIBODIES: B burgdorferi Ab IgG+IgM: 0.2 {ISR}

## 2014-07-11 MED ORDER — LORATADINE 10 MG PO TABS: 10.0000 mg | ORAL_TABLET | Freq: Every day | ORAL | Status: DC

## 2014-07-11 MED ORDER — ATORVASTATIN CALCIUM 10 MG PO TABS: 10.0000 mg | ORAL_TABLET | Freq: Every day | ORAL | Status: DC

## 2014-07-11 MED ORDER — FOLIC ACID 1 MG PO TABS: 1.0000 mg | ORAL_TABLET | Freq: Every day | ORAL | Status: DC

## 2014-07-11 MED ORDER — LORAZEPAM 1 MG PO TABS
1.0000 mg | ORAL_TABLET | Freq: Three times a day (TID) | ORAL | Status: DC
Start: 2014-07-11 — End: 2014-09-12

## 2014-07-11 MED ORDER — OXYCODONE HCL 5 MG PO TABS: 5.0000 mg | ORAL_TABLET | ORAL | Status: DC | PRN

## 2014-07-11 MED ORDER — AMLODIPINE BESYLATE 10 MG PO TABS
10.0000 mg | ORAL_TABLET | Freq: Every day | ORAL | Status: DC
Start: 2014-07-11 — End: 2014-09-06

## 2014-07-11 MED ORDER — TAMSULOSIN HCL 0.4 MG PO CAPS: 0.4000 mg | ORAL_CAPSULE | Freq: Every day | ORAL | Status: DC

## 2014-07-11 MED ORDER — ASPIRIN 81 MG PO TBEC
81.0000 mg | DELAYED_RELEASE_TABLET | Freq: Every day | ORAL | Status: AC
Start: 2014-07-11 — End: ?

## 2014-07-11 MED ORDER — LISINOPRIL-HYDROCHLOROTHIAZIDE 20-12.5 MG PO TABS: 1.0000 | ORAL_TABLET | Freq: Every day | ORAL | Status: DC

## 2014-07-11 MED ORDER — CEFUROXIME AXETIL 500 MG PO TABS: 500.0000 mg | ORAL_TABLET | Freq: Two times a day (BID) | ORAL | Status: DC

## 2014-07-11 MED ORDER — FLUTICASONE PROPIONATE 50 MCG/ACT NA SUSP: 1.0000 | Freq: Every day | NASAL | Status: DC

## 2014-07-11 MED ORDER — MIRTAZAPINE 7.5 MG PO TABS: 7.5000 mg | ORAL_TABLET | Freq: Every day | ORAL | Status: DC

## 2014-07-11 NOTE — Plan of Care (Signed)
Problem: RH Bed Mobility Goal: LTG Patient will perform bed mobility with assist (PT) LTG: Patient will perform bed mobility with assistance, with/without cues (PT).  Outcome: Completed/Met Date Met:  07/11/14 Goal surpassed, as patient performed bed mobility independently on D/C evaluation.

## 2014-07-11 NOTE — Progress Notes (Signed)
68 y.o. right-handed male with history of prostate cancer status post radiation as well as alcohol abuse. 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 Alcohol level 155 upon admission. MRI of the brain shows punctate nonhemorrhagic infarct involving the anterior left frontal lobe, remote 57mm left frontal lobe white matter infarct as well as bilateral maxillary sinus disease. Cardiac enzymes negative. 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 rate 18, height 5\' 10"  (1.778 m), weight 92.352 kg (203 lb 9.6 oz), SpO2 96.00%. No results found. Results for orders placed during the hospital encounter of 07/07/14 (from the past 72 hour(s))  TSH     Status: Abnormal   Collection Time    07/09/14  2:00 PM      Result Value Ref Range   TSH 4.510 (*) 0.350 - 4.500 uIU/mL  RPR     Status: None   Collection Time    07/09/14  2:00 PM      Result Value Ref Range   RPR NON REAC  NON REAC   Comment: Performed at [facility]: normal Cardio: RRR Resp: CTA B/L GI: BS positive Extremity:  Pulses positive and No Edema Skin:   Intact Neuro: Alert/Oriented, Cranial Nerve II-XII normal, Normal Sensory, Normal Motor and Other bilateral upper ext tremor Musc/Skel:   Normal Gen NAD Ext- feet with mild intrinsic atrophy  Assessment/Plan: 1. Functional deficits secondary to Left anterior frontal infarct  which require 3+ hours per day of interdisciplinary therapy in a comprehensive inpatient rehab setting. Physiatrist is providing close team supervision and 24 hour management of active medical problems listed below. Physiatrist and rehab team continue to assess barriers to discharge/monitor patient progress toward functional and medical goals. Plan D/C in am, ? After therapy today. ?mod I in room PMR f/u in 6 wks FIM: FIM - Bathing Bathing Steps Patient Completed: Chest;Right Arm;Left Arm;Abdomen;Buttocks;Right upper leg;Left upper leg;Front perineal area;Right lower leg (including foot);Left lower leg (including foot) Bathing: 5: Supervision: Safety issues/verbal cues  FIM - Upper Body Dressing/Undressing Upper body dressing/undressing steps patient completed: Thread/unthread right sleeve of pullover shirt/dresss;Thread/unthread left sleeve of pullover shirt/dress;Put head through opening of pull over shirt/dress;Pull shirt over trunk Upper body dressing/undressing: 5: Supervision: Safety issues/verbal cues FIM - Lower Body Dressing/Undressing Lower body dressing/undressing steps patient completed: Thread/unthread right underwear leg;Thread/unthread left underwear leg;Pull underwear up/down;Thread/unthread right pants leg;Thread/unthread left pants leg;Pull pants up/down;Don/Doff right sock;Don/Doff left sock Lower body dressing/undressing: 5: Supervision: Safety issues/verbal cues  FIM - Toileting Toileting steps completed by patient: Adjust clothing prior to toileting;Performs perineal hygiene;Adjust clothing after toileting Toileting Assistive Devices: Grab bar or rail for support Toileting: 4: Steadying assist     FIM - Control and instrumentation engineer Devices: Copy: 5: Supine > Sit: Supervision (verbal  cues/safety  issues);5: Sit > Supine: Supervision (verbal cues/safety issues);5: Bed > Chair or W/C: Supervision (verbal cues/safety issues);5: Chair or W/C > Bed: Supervision (verbal cues/safety issues)  FIM - Locomotion: Wheelchair Locomotion: Wheelchair: 0: Activity did not occur (Pt ambulatory on unit) FIM - Locomotion: Ambulation Locomotion: Ambulation Assistive Devices: Administrator Ambulation/Gait Assistance: 5: Supervision Locomotion: Ambulation: 5: Travels 150 ft or more with supervision/safety issues  Comprehension Comprehension Mode: Auditory Comprehension: 6-Follows complex conversation/direction: With extra time/assistive device  Expression Expression Mode: Verbal Expression: 6-Expresses complex ideas: With extra time/assistive device  Social Interaction Social Interaction: 5-Interacts appropriately 90% of the time - Needs monitoring or encouragement for participation or interaction.  Problem Solving Problem Solving: 5-Solves complex 90% of the time/cues < 10% of the time  Memory Memory: 5-Recognizes or recalls 90% of the time/requires cueing < 10% of the time  Medical Problem List and Plan:  1. Functional deficits secondary to thrombotic left frontal infarct  2. DVT Prophylaxis/Anticoagulation: Subcutaneous Lovenox. Monitor platelet counts any signs of bleeding  3. Pain Management: Oxycodone 5 mg every 4 as needed moderate pain. Monitor with increased mobility  4. Mood/anxiety/depression. Ativan 1 mg 3 a day . Provide emotional support: -will consult neuropsych for depression and coping skills. He reports battling depression for some time, rec outpt f/u for this 5. Neuropsych: This patient is capable of making decisions on his own behalf.  6. Hypertension. Prinzide 20-12.5 mg daily, Norvasc 10 mg daily. Monitor with increased mobility  7. History of alcohol abuse. Monitor for any signs of withdrawal. Provide counseling on floor  8. Hyperlipidemia. Lipitor  9.  Bilateral maxillary sinus disease. Complete course of Ceftin initiated 07/06/2014 x2 weeks  10. History of prostate cancer. Continue Flomax.  11. Gait imbalance/vertigo, BPPV improved vertigo resolved now has gait imbalance (+romberg), which may be multifactorial but further w/u pend 12.  Depression started on remeron LOS (Days) 4 A FACE TO FACE EVALUATION WAS PERFORMED  KIRSTEINS,ANDREW E 07/11/2014, 6:53 AM

## 2014-07-11 NOTE — Progress Notes (Signed)
Social Work Patient ID: Joshua Henson, male   DOB: 18-Dec-1946, 68 y.o.   MRN: [id number] Team and MD feel pt can go home today after his afternoon therapies rather than tomorrow.  [name] is coming for education this afternoon and is aware of the plan. She is somewhat stressed but will make it work.  Discussed follow up psychiatrist he would like to see a list and talk to some to find out who he chooses to go to.  Will get him a list of area psychiatrists.

## 2014-07-11 NOTE — Progress Notes (Signed)
Occupational Therapy Session Note  Patient Details  Name: DEO MEHRINGER MRN: [id number] Date of Birth: December 08, 1946  Today's Date: 07/11/2014 Time: 9794-8016 and 5537-4827 Time Calculation (min): 45 min and 25 min  Short Term Goals: Week 1:  OT Short Term Goal 1 (Week 1): STG = LTGs due to ELOS  Skilled Therapeutic Interventions/Progress Updates:    1) Pt completed ADL retraining at modified independent with self-care tasks with exception of supervision with LB dressing and tranfers/functional mobility with RW.  Pt having suture removed by RN upon arrival, discussed safety awareness and fall risk at home and use of strategies provided during previous sessions to increase safety with functional mobility and self-care tasks.  Pt demonstrated increased carryover of education with use of shower chair during bathing and proper use of RW.  One verbal cue for sidestepping with RW when unable to walk straight through narrow space in room when pt attempted to just leave RW to side.  Discussed proper transfer technique into walk-in shower at home and appropriate placement of RW.  Carryover of sit > stand with LB dressing instead of standing.  Pt reports understanding of recommendations and requested to rest prior to next therapy session.   2) Engaged in family education with pt's significant other with focus on safe use of RW in bathroom and modifications to increase safety in shower.  Upon arrival, pt reports he is ready to D/C and was not interested in completing education with therapist and significant other.  Discussed safety with pt and significant other and suggestion of supervision with all mobility and transfers as well as LB dressing due to fall risk.  Demonstrated to significant other proper use of RW with walk-in shower transfer and recommended placement of grab bars.  Reviewed use of shower seat to increase safety, especially when washing hair.  Pt's significant other reports understanding of  recommendations and voiced concern that pt will not abide by recommendations.    Therapy Documentation Precautions:  Precautions Precautions: Fall Precaution Comments: gait instability (more prominent with turning) Restrictions Weight Bearing Restrictions: No Pain: Pain Assessment Pain Assessment: No/denies pain Pain Score: 0-No pain  See FIM for current functional status  Therapy/Group: Individual Therapy  Simonne Come 07/11/2014, 10:25 AM

## 2014-07-11 NOTE — Discharge Summary (Signed)
Discharge summary job [id number]

## 2014-07-11 NOTE — Progress Notes (Signed)
Occupational Therapy Discharge Summary  Patient Details  Name: Joshua Henson MRN: [id number] Date of Birth: 15-Jun-1946  Today's Date: 07/11/2014  Patient has met 10 of 10 long term goals due to improved balance, postural control and improved awareness.  Patient to discharge at overall Supervision level.  Patient's care partner is independent to provide the necessary cognitive assistance at discharge.    Reasons goals not met: N/A  Recommendation:  Patient will not require follow up OT at this time. However will benefit from vestibular rehab PT/OT to further address balance and frequent vestibular issues.  Equipment: shower chair and RW  Reasons for discharge: treatment goals met and discharge from hospital  Patient/family agrees with progress made and goals achieved: Yes  OT Discharge Precautions/Restrictions  Precautions Precautions: Fall Precaution Comments: gait instability (more prominent with turning) Restrictions Weight Bearing Restrictions: No General Amount of Missed OT Time (min): 10 Minutes Pain  no c/o pain ADL  See FIM Vision/Perception  Vision- History Baseline Vision/History: Wears glasses Wears Glasses: At all times (trifocals) Patient Visual Report: No change from baseline  Cognition Overall Cognitive Status: History of cognitive impairments - at baseline Arousal/Alertness: Awake/alert Orientation Level: Oriented X4 Awareness: Impaired Awareness Impairment: Emergent impairment Problem Solving: Impaired Problem Solving Impairment: Functional complex Executive Function: Self Monitoring;Self Correcting Self Monitoring: Impaired Self Correcting: Impaired Behaviors: Restless;Poor frustration tolerance Safety/Judgment: Impaired Sensation Sensation Light Touch: Appears Intact Proprioception: Appears Intact Coordination Gross Motor Movements are Fluid and Coordinated: No Fine Motor Movements are Fluid and Coordinated: Yes Finger Nose Finger Test:  [facility] Extremity/Trunk Assessment RUE Assessment RUE Assessment: Within Functional Limits (slight tremor occasionally) LUE Assessment LUE Assessment: Within Functional Limits  See FIM for current functional status  Takiah Maiden, [facility] 07/11/2014, 3:23 PM

## 2014-07-11 NOTE — Progress Notes (Signed)
Social Work Discharge Note Discharge Note  The overall goal for the admission was met for:   Discharge location: [place] [address] SUPERVISION  Length of Stay: Yes-4 DAYS  Discharge activity level: Yes-SUPERVISION LEVEL FOR SAFETY  Home/community participation: Yes  Services provided included: MD, RD, PT, OT, SLP, RN, CM, TR, Pharmacy and SW  Financial Services: Medicare and Private Insurance: [place]  Follow-up services arranged: Outpatient: [facility]OPPT 7/30 10;30-11;45 AM, DME: [facility] and Patient/Family has no preference for HH/DME agencies  Comments (or additional information):FAMILY EDUCATED COMPLETED TODAY-FELT REACHED GOALS NO NEED TO STAY UNTIL TOMORROW.  AWARE OF SUBSTANCE ABUSE RESOURCES WANTED TO WAIT. LIST OF PHYSICATRISTS GIVEN WILL WAIT, FEELS NEEDS TO SEE ENT THEN GO FROM THERE, THIS IS HIS MAIN ISSUE.  INFORMED IF WANTED APPT SET UP TO CONTACT WORKER. MICHELLE AND PT AWARE IF QUES OR CONCERNS ARISE TO CONTACT THIS WORKER.  Patient/Family verbalized understanding of follow-up arrangements: Yes  Individual responsible for coordination of the follow-up plan: SELF & MICHELLE-GIRLFRIEND  Confirmed correct DME delivered: Elease Hashimoto 07/11/2014    Elease Hashimoto

## 2014-07-11 NOTE — Progress Notes (Signed)
Removed sutures from left forehead per MD order.  Tolerated well, no bleeding noted.  Brita Romp, RN

## 2014-07-11 NOTE — Progress Notes (Signed)
Physical Therapy Discharge Summary  Patient Details  Name: Joshua Henson MRN: [id number] Date of Birth: 1946/11/09  Today's Date: 07/11/2014 Time: 4098-1191 and 4782-9562 Time Calculation (min): 59 min and 41 min  Patient has met 11 of 11 long term goals due to improved balance, improved postural control and ability to compensate for deficits.  Patient to discharge at an ambulatory level Supervision.   Patient's care partner is independent to provide the necessary physical and cognitive assistance at discharge.  Reasons goals not met: N/A; all goals met or surpassed.  Recommendation:  Patient will benefit from ongoing skilled PT services in outpatient setting to continue to advance safe functional mobility, address ongoing vestibular impairments, decreased stability/independence with functional mobility, safety awareness, and to minimize fall risk.  Equipment: rolling walker  Reasons for discharge: treatment goals met and discharge from hospital  Patient/family agrees with progress made and goals achieved: Yes  Skilled Therapeutic Interventions/Progress Updates: Treatment Session 1: Pt received semi reclined in bed; agreeable to therapy. Session focused on assessing/addressing stability/independence with functional mobility. Bilat CRM negative and symptomatic, suggesting no presence of BPPV. Completed Berg Balance Scale, scoring 43/56. See below for detailed findings. Educated pt on findings, functional implications, and progress. Emphasized continued risk for fall. Pt verbalized understanding. See below for detailed description of D/C evaluation findings. Educated pt on technique for curb negotiation. Pt gave effective return demonstration. We readdress during PM session when girlfriend present. Session ended in pt room, where pt was left semi reclined in bed with 2 bed rails up, bed alarm on, and all needs within reach.  Treatment Session 2: Pt received semi reclined in bed with spouse  present; agreeable to therapy. Session focused on completion of hands-on family training. Spouse provided safe supervision and appropriate cueing with all of the following functional mobility: gait >300' in controlled, home, and community environments (outside hospital lobby on inclined/declined sidewalk and over pavers) with rolling walker and supervision with no overt LOB noted; negotiation of 3 steps with R rail (to simulate home environment) x2 trials with step-to pattern and supervision; negotiation of standard curb (outside) with supervision and manual stabilization (via spouse) of rolling walker. Pt did require mod verbal cueing of spouse for safety awareness and safe/proper use of rolling walker.  Explained, demonstrated fall recovery and floor transfer in rehab apartment. Pt performed floor transfer (seated on couch<>supine on floor) with supervision for stability/balance. Explained to spouse that pt will require hands-on assist if unable to transfer off floor after fall due to dizziness or pain. Demonstrated how to provide assist; spouse gave effective return demonstration. Instructed pt/spouse to activate EMS if loss of consciousness or injury are suspected after fall. Pt/spouse verbalized understanding. Had frank discussion with pt and spouse concerning gait instability, significant risk of falling, poor safety awareness, and importance of wife remaining within arm's length of pt at all times during mobility as well as providing cueing for safety as needed.Pt and spouse verbalized understanding and verbally agreed. Therapist departed with pt seated in recliner with spouse present and all needs within reach.  PT Discharge Precautions/Restrictions Precautions Precautions: Fall Precaution Comments: gait instability (more prominent with turning) Restrictions Weight Bearing Restrictions: No Pain Pain Assessment Pain Assessment: No/denies pain Pain Score: 0-No pain Cognition Overall Cognitive  Status: History of cognitive impairments - at baseline Arousal/Alertness: Awake/alert Orientation Level: Oriented X4 Awareness: Impaired Awareness Impairment: Emergent impairment Problem Solving: Impaired Problem Solving Impairment: Functional complex Executive Function: Self Monitoring;Self Correcting Self Monitoring: Impaired Self Correcting: Impaired  Behaviors: Restless;Poor frustration tolerance Safety/Judgment: Impaired Sensation Sensation Light Touch: Appears Intact Proprioception: Appears Intact Coordination Gross Motor Movements are Fluid and Coordinated: No Fine Motor Movements are Fluid and Coordinated: Yes Heel Shin Test: Coordination of movement slightly less in R LE as compared with L LE Motor  Motor Motor: Other (comment) Motor - Discharge Observations: Postural instability with dynamic standing and gait; noted pill-rolling tremor in L hand and resting tremor in R hand  Mobility Bed Mobility Bed Mobility: Supine to Sit;Sitting - Scoot to Edge of Bed;Sit to Supine Supine to Sit: 7: Independent Supine to Sit Details (indicate cue type and reason): Independent with bed mobility but supervision recommended secondary to decreased safety awareness Sitting - Scoot to Edge of Bed: 7: Independent Sitting - Scoot to Edge of Bed Details (indicate cue type and reason): Independent but supervision recommended secondary to decreased safety awareness Sit to Supine: 7: Independent Sit to Supine - Details (indicate cue type and reason): Independent but supervision recommended secondary to decreased safety awareness Transfers Transfers: Yes Sit to Stand: 5: Supervision;From bed;Without upper extremity assist;From chair/3-in-1 Stand to Sit: 5: Supervision;To bed;To chair/3-in-1;Without upper extremity assist Stand Pivot Transfers: 5: Supervision Stand Pivot Transfer Details: Verbal cues for precautions/safety Stand Pivot Transfer Details (indicate cue type and reason): Stand pivot  transfers with rolling walker, verbal cueing for safe hand placement, use of rolling walker Locomotion  Ambulation Ambulation: Yes Ambulation/Gait Assistance: 5: Supervision Ambulation Distance (Feet): 250 Feet Assistive device: Rolling walker Ambulation/Gait Assistance Details: Verbal cues for precautions/safety Ambulation/Gait Assistance Details: Pt performed gait x250' in controlled, home, and community environments with rolling walker and supervision, verbal cueing during turning for use of compensatory strategies for vestibular dysfunction. Gait Gait: Yes Gait Pattern: Impaired Stairs / Additional Locomotion Stairs: Yes Stairs Assistance: 5: Supervision Stairs Assistance Details: Verbal cues for precautions/safety Stairs Assistance Details (indicate cue type and reason): Pt negotiated full flight of standard stairs with RUE support at rail and step-to pattern requiring supervision for adequate clearance of bilat LE's during ascent/descent; no overt LOB. Stair Management Technique: One rail Right;Step to pattern;Forwards Number of Stairs: 12 Height of Stairs: 7.5 Ramp: 5: Supervision Curb: 5: Building services engineer Mobility: No (Pt ambulatory on unit)  Trunk/Postural Assessment  Cervical Assessment Cervical Assessment: Within Functional Limits Thoracic Assessment Thoracic Assessment: Within Functional Limits (thoracic kyphosis in seated but able to correct with min cueing) Lumbar Assessment Lumbar Assessment: Within Functional Limits Postural Control Postural Control: Deficits on evaluation Trunk Control: Posterior preference only noted with increased faitgue and with more challenging balance tasks in standing. No lateral trunk lean to L side noted today. Righting Reactions: Pt consistently demonstrates effective ankle strategy with AP/PA perturbations in standing. Stepping strategy present with A/P but not with P/A perturbations. Hip strategy present but  delayed and inconsistently effective in standing.  Balance Balance Balance Assessed: Yes Standardized Balance Assessment Standardized Balance Assessment: Berg Balance Test Berg Balance Test Sit to Stand: Able to stand without using hands and stabilize independently Standing Unsupported: Able to stand safely 2 minutes Sitting with Back Unsupported but Feet Supported on Floor or Stool: Able to sit safely and securely 2 minutes Stand to Sit: Sits safely with minimal use of hands Transfers: Able to transfer safely, minor use of hands (Supervision required for safety awareness only (not for stability/balance)) Standing Unsupported with Eyes Closed: Able to stand 10 seconds with supervision (Minimal A/P postural sway; near LOB to L side with effective self-recovery) Standing Ubsupported with Feet Together: Able  to place feet together independently and stand for 1 minute with supervision From Standing, Reach Forward with Outstretched Arm: Can reach forward >12 cm safely (5") From Standing Position, Pick up Object from Floor: Able to pick up shoe, needs supervision From Standing Position, Turn to Look Behind Over each Shoulder: Looks behind from both sides and weight shifts well Turn 360 Degrees: Able to turn 360 degrees safely but slowly (13 seconds to L, 7.5 seconds to R) Standing Unsupported, Alternately Place Feet on Step/Stool: Able to stand independently and safely and complete 8 steps in 20 seconds (18 seconds; no LOB) Standing Unsupported, One Foot in Front: Loses balance while stepping or standing Standing on One Leg: Tries to lift leg/unable to hold 3 seconds but remains standing independently Total Score: 43 Extremity Assessment  RUE Assessment RUE Assessment: Within Functional Limits (slight tremor occasionally) LUE Assessment LUE Assessment: Within Functional Limits RLE Assessment RLE Assessment: Within Functional Limits LLE Assessment LLE Assessment: Within Functional  Limits  See FIM for current functional status  Hobble, Malva Cogan 07/11/2014, 5:05 PM

## 2014-07-11 NOTE — Discharge Summary (Signed)
Joshua Henson, Joshua Henson                  ACCOUNT NO.:  [id number]  MEDICAL RECORD NO.:  [id number]  LOCATION:  4M04C                        FACILITY:  [facility]  PHYSICIAN:  Lauraine Rinne, P.A.  DATE OF BIRTH:  06-08-1946  DATE OF ADMISSION:  07/07/2014 DATE OF DISCHARGE:  07/11/2014                              DISCHARGE SUMMARY   DISCHARGE DIAGNOSES: 1. Functional deficits secondary to thrombotic left frontal     infarction. 2. Subcutaneous Lovenox for deep vein thrombosis prophylaxis. 3. Pain management. 4. Anxiety with depression. 5. Hypertension. 6. History of alcohol abuse. 7. Hyperlipidemia. 8. Bilateral maxillary sinus disease. 9. History of prostate cancer. 10.Gait with imbalance -- vertigo.  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  Presented on July 02, 2014, after syncopal episode.  When he fell on the kitchen, he struck his head on a counter top, suffering a laceration to his left eyebrow.  Alcohol level 155 upon admission.  MRI of the brain showed punctate nonhemorrhagic infarction involving the anterior left frontal lobe, remote, 8 mm, left frontal lobe, white matter infarction as well as bilateral maxillary sinus disease.  Cardiac enzymes negative.  The patient did not receive tPA.  Echocardiogram with normal systolic function, no wall motion abnormalities.  CT angiogram of the head and neck with atherosclerotic type changes no stenosis.  Neurology Services consulted, placed on aspirin for CVA prophylaxis as well as subcutaneous Lovenox for DVT prophylaxis.  Ceftin added for maxillary sinus disease identified on MRI.  The patient tolerating a regular diet.  Physical and occupational therapy is ongoing.  The patient was admitted for comprehensive rehab program.  PAST MEDICAL HISTORY:  See discharge diagnosis.  SOCIAL HISTORY:  Lives  with significant other.  FUNCTIONAL HISTORY:  Prior to admission independent.  Functional status upon admission to rehab services was moderate assist ambulate 30 feet with a rolling walker, moderate assist stand pivot transfers, min to mod assist activities of daily living.  PHYSICAL EXAMINATION:  VITAL SIGNS:  Blood pressure 165/95, pulse 98, temperature 98.4, respirations 20. GENERAL:  This was an alert male, oriented to person, place, and time. HEENT:  Healing laceration over left eyebrow.  Pupils round reactive to light. LUNGS:  Clear to auscultation. CARDIAC:  Regular rate and rhythm. ABDOMEN:  Soft, nontender.  Good bowel sounds.  REHABILITATION HOSPITAL COURSE:  The patient was admitted to inpatient rehab services with therapies initiated on a 3-hour daily basis consisting of physical therapy, occupational therapy, speech therapy, and rehabilitation nursing.  The following issues were addressed during the patient's rehabilitation stay.  Pertaining to Mr. Seward's thrombotic left frontal infarction remained stable, maintained on aspirin therapy. He would follow up with Neurology Services.  He remained on subcutaneous Lovenox for DVT prophylaxis throughout his rehab course.  He did have a history of anxiety and depression.  Remeron had been added to his regimen with emotional support provided.  Blood pressures controlled with resuming of lisinopril as well as hydrochlorothiazide with low-dose Norvasc.  The patient was noted history of alcohol use, monitoring for any signs of  alcohol withdrawal.  He did receive full counseling in regard to cessation of alcohol.  Noted alcohol level was 155 upon his hospital admission.  Noted persistent bouts of vertigo question syncope during workup of CVA, MRI did show bilateral maxillary sinus disease. He was completing a 2-week course of Ceftin.  ENT Services was consulted for follow up outpatient in regard to his vertigo requests for a  BAER study per Dr. 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  BAER otherwise normal. Tympanometry normal.  RPR, TSH normal.  Outpatient f/u for ENG recommended.  The patient did have a history of prostate cancer.  He remained on Flomax. He was voiding without difficulty.  The patient received weekly collaborative interdisciplinary team conferences to discuss estimated length of stay, family teaching, and any barriers to his discharge.  He was ambulating 200 feet x2, and had controlled home environment, community environment with a rolling walker and supervision.  Perform car transfers with rolling walker supervision, increased time and minimal verbal cues for safe hand placement.  Educated patient on fall recovery situations, in which the patient should activate EMS after a fall.  Activities of daily living, the patient ambulated to room shower with min steady assist secondary to some unsteadiness.  Educated the patient on sitting on shower chair for safety especially while washing lower extremities.  The patient was able to communicate his needs.  He had a relative good understanding of his awareness during functional task again with recommendations and supervision.  Speech therapy facilitated sessions with semi-complex medications management task targeting planning for organization and any awareness.  The patient had been working as a Chief Executive Officer prior to admission.  For now he would not return to work until follow up plans as he recuperated from his CVA. Ongoing therapies.  Home health were recommended.  DISCHARGE MEDICATIONS: 1. Norvasc 10 mg p.o. daily. 2. Aspirin 81 mg p.o. daily. 3. Lipitor 10 mg p.o. daily. 4. Ceftin 500 mg p.o. b.i.d. x2 weeks, initiated July 06, 2014. 5. Flonase 1 spray to each nostril daily. 6. Folic acid 1 mg p.o. daily. 7. Hydrochlorothiazide 12.5 mg p.o.  daily. 8. Lisinopril 20 mg p.o. daily. 9. Claritin 10 mg p.o. daily. 10.Ativan 1 mg p.o. t.i.d. 11.Remeron 7.5 mg p.o. at bedtime. 12.Multivitamin 1 tablet p.o. daily. 13.Oxycodone immediate release 5 mg every 4 hours as needed moderate     pain, dispense of 60 tablets. 14.Flomax 0.4 mg p.o. daily.  DIET:  His diet was regular.  SPECIAL INSTRUCTIONS:  The patient would follow up Dr. Charlett Blake at the outpatient rehab center on August 21, 2013.  Dr. Erlinda Hong of Neurology Services.  One-month call for appointment; Dr. Vicie Mutters, ENT services July 28, 2014, arrival of 01:25 p.m.  Dr. Darrick Penna. Linna Darner, July 24, 2014, medical management.  Ongoing home health physical and occupational therapy arranged.     Lauraine Rinne, P.A.     DA/MEDQ  D:  07/11/2014  T:  07/11/2014  Job:  [id number]  cc:   Charlett Blake, M.D. Darrick Penna. Linna Darner, MD,FACP,FCCP Fannie Knee, M.D.

## 2014-07-11 NOTE — Discharge Instructions (Signed)
Inpatient Rehab Discharge Instructions  [place] Discharge date and time: No discharge date for patient encounter.   Activities/Precautions/ Functional Status: Activity: activity as tolerated Diet: regular diet Wound Care: none needed Functional status:  ___ No restrictions     ___ Walk up steps independently __x_ 24/7 supervision/assistance   ___ Walk up steps with assistance ___ Intermittent supervision/assistance  ___ Bathe/dress independently ___ Walk with walker     ___ Bathe/dress with assistance ___ Walk Independently    ___ Shower independently __x_ Walk with assistance    ___ Shower with assistance ___ No alcohol     ___ Return to work/school ________  Special Instructions: No alcohol or driving  COMMUNITY REFERRALS UPON DISCHARGE:    Outpatient: PT  Agency:[facility]:[phone] Date of Last Service:07/12/2014  Appointment Date/Time:JULY 30-Thursday  10;30-11;45 AM  Medical Equipment/Items Ordered:ROLLING WALKER & TUB SEAT  Agency/Supplier:[facility]    [phone] Other:FELT SUBSTANCE ABUSE SERVICES NOT WARRANTED AT THIS TIME.  WANTED TO WAIT ON PHYSICATRIST APPT ALSO  GENERAL COMMUNITY RESOURCES FOR PATIENT/FAMILY: Support Groups:[facility] SUPPORT GROUP  STROKE/TIA DISCHARGE INSTRUCTIONS SMOKING Cigarette smoking nearly doubles your risk of having a stroke & is the single most alterable risk factor  If you smoke or have smoked in the last 12 months, you are advised to quit smoking for your health.  Most of the excess cardiovascular risk related to smoking disappears within a year of stopping.  Ask you doctor about anti-smoking medications  [facility] Quit Line: 1-800-QUIT NOW  Free Smoking Cessation Classes [phone]  CHOLESTEROL Know your levels; limit fat & cholesterol in your diet  Lipid Panel     Component Value Date/Time   CHOL 118 07/04/2014 2358   TRIG 77 07/04/2014 2358   HDL 57 07/04/2014 2358   CHOLHDL 2.1 07/04/2014 2358   VLDL 15 07/04/2014 2358   LDLCALC 46 07/04/2014 2358      Many patients benefit from treatment even if their cholesterol is at goal.  Goal: Total Cholesterol (CHOL) less than 160  Goal:  Triglycerides (TRIG) less than 150  Goal:  HDL greater than 40  Goal:  LDL (LDLCALC) less than 100   BLOOD PRESSURE American Stroke Association blood pressure target is less that 120/80 mm/Hg  Your discharge blood pressure is:  BP: 147/92 mmHg  Monitor your blood pressure  Limit your salt and alcohol intake  Many individuals will require more than one medication for high blood pressure  DIABETES (A1c is a blood sugar average for last 3 months) Goal HGBA1c is under 7% (HBGA1c is blood sugar average for last 3 months)  Diabetes: No known diagnosis of diabetes    Lab Results  Component Value Date   HGBA1C 5.6 07/04/2014     Your HGBA1c can be lowered with medications, healthy diet, and exercise.  Check your blood sugar as directed by your physician  Call your physician if you experience unexplained or low blood sugars.  PHYSICAL ACTIVITY/REHABILITATION Goal is 30 minutes at least 4 days per week  Activity: Increase activity slowly, Therapies: Physical Therapy: Home Health Return to work:   Activity decreases your risk of heart attack and stroke and makes your heart stronger.  It helps control your weight and blood pressure; helps you relax and can improve your mood.  Participate in a regular exercise program.  Talk with your doctor about the best form of exercise for you (dancing, walking, swimming, cycling).  DIET/WEIGHT Goal is to maintain a healthy weight  Your discharge diet is: Cardiac  liquids Your height is:  Height: 5\' 10"  (177.8 cm) Your current weight is: Weight: 92.352 kg (203 lb 9.6 oz) Your Body Mass Index (BMI) is:  BMI (Calculated): 29.3  Following the type of diet specifically designed for you will help prevent another stroke.  Your goal weight range is:    Your goal  Body Mass Index (BMI) is 19-24.  Healthy food habits can help reduce 3 risk factors for stroke:  High cholesterol, hypertension, and excess weight.  RESOURCES Stroke/Support Group:  Call [phone]   STROKE EDUCATION PROVIDED/REVIEWED AND GIVEN TO PATIENT Stroke warning signs and symptoms How to activate emergency medical system (call 911). Medications prescribed at discharge. Need for follow-up after discharge. Personal risk factors for stroke. Pneumonia vaccine given:  Flu vaccine given:  My questions have been answered, the writing is legible, and I understand these instructions.  I will adhere to these goals & educational materials that have been provided to me after my discharge from the hospital.       My questions have been answered and I understand these instructions. I will adhere to these goals and the provided educational materials after my discharge from the hospital.  Patient/Caregiver Signature _______________________________ Date __________  Clinician Signature _______________________________________ Date __________  Please bring this form and your medication list with you to all your follow-up doctor's appointments.

## 2014-07-11 NOTE — Progress Notes (Signed)
Patient discharged home.  Left floor via [place]echair, escorted by nursing staff and significant other.  Appears to be in no immediate distress at this time.  Patient verbalized understanding of discharge instructions as given by Marlowe Shores, PA.  All patient belongings including DME sent with patient.  Brita Romp, RN

## 2014-07-17 ENCOUNTER — Ambulatory Visit: Payer: Medicare Other | Attending: Physical Medicine & Rehabilitation | Admitting: Physical Therapy

## 2014-07-17 DIAGNOSIS — IMO0001 Reserved for inherently not codable concepts without codable children: Secondary | ICD-10-CM | POA: Diagnosis not present

## 2014-07-17 DIAGNOSIS — R42 Dizziness and giddiness: Secondary | ICD-10-CM | POA: Diagnosis not present

## 2014-07-17 DIAGNOSIS — R269 Unspecified abnormalities of gait and mobility: Secondary | ICD-10-CM | POA: Diagnosis not present

## 2014-07-17 DIAGNOSIS — Z9181 History of falling: Secondary | ICD-10-CM | POA: Insufficient documentation

## 2014-07-24 ENCOUNTER — Ambulatory Visit: Payer: Medicare Other | Admitting: Internal Medicine

## 2014-07-24 DIAGNOSIS — R4689 Other symptoms and signs involving appearance and behavior: Secondary | ICD-10-CM | POA: Insufficient documentation

## 2014-07-24 DIAGNOSIS — Z0289 Encounter for other administrative examinations: Secondary | ICD-10-CM

## 2014-07-29 ENCOUNTER — Ambulatory Visit: Payer: Medicare Other | Attending: Physical Medicine & Rehabilitation | Admitting: Physical Therapy

## 2014-07-29 DIAGNOSIS — IMO0001 Reserved for inherently not codable concepts without codable children: Secondary | ICD-10-CM | POA: Diagnosis not present

## 2014-07-29 DIAGNOSIS — R42 Dizziness and giddiness: Secondary | ICD-10-CM | POA: Diagnosis not present

## 2014-07-29 DIAGNOSIS — Z9181 History of falling: Secondary | ICD-10-CM | POA: Diagnosis not present

## 2014-07-29 DIAGNOSIS — R269 Unspecified abnormalities of gait and mobility: Secondary | ICD-10-CM | POA: Diagnosis not present

## 2014-08-01 ENCOUNTER — Ambulatory Visit: Payer: Medicare Other | Admitting: Physical Therapy

## 2014-08-01 DIAGNOSIS — IMO0001 Reserved for inherently not codable concepts without codable children: Secondary | ICD-10-CM | POA: Diagnosis not present

## 2014-08-05 ENCOUNTER — Encounter: Payer: Medicare Other | Admitting: Physical Therapy

## 2014-08-07 ENCOUNTER — Encounter: Payer: Medicare Other | Admitting: Physical Therapy

## 2014-08-11 ENCOUNTER — Ambulatory Visit: Payer: Medicare Other | Admitting: Physical Therapy

## 2014-08-11 DIAGNOSIS — IMO0001 Reserved for inherently not codable concepts without codable children: Secondary | ICD-10-CM | POA: Diagnosis not present

## 2014-08-12 ENCOUNTER — Ambulatory Visit: Payer: Medicare Other | Admitting: Neurology

## 2014-08-13 ENCOUNTER — Ambulatory Visit: Payer: Medicare Other | Admitting: Physical Therapy

## 2014-08-14 ENCOUNTER — Telehealth: Payer: Self-pay | Admitting: Neurology

## 2014-08-14 NOTE — Telephone Encounter (Signed)
Pt no showed 08/12/14 NP appt w/ Dr. Tomi Likens.   Tish Frederickson - please notify referring office of no show. Melissa Noon - please mail no show letter to pt    [facility]

## 2014-08-18 ENCOUNTER — Ambulatory Visit: Payer: Medicare Other | Admitting: Physical Therapy

## 2014-08-18 ENCOUNTER — Encounter: Payer: Self-pay | Admitting: *Deleted

## 2014-08-18 DIAGNOSIS — IMO0001 Reserved for inherently not codable concepts without codable children: Secondary | ICD-10-CM | POA: Diagnosis not present

## 2014-08-18 NOTE — Progress Notes (Signed)
No show letter sent for 08/12/2014

## 2014-08-20 ENCOUNTER — Encounter: Payer: Medicare Other | Admitting: Physical Therapy

## 2014-08-21 ENCOUNTER — Inpatient Hospital Stay: Payer: Medicare Other | Admitting: Physical Medicine & Rehabilitation

## 2014-08-26 ENCOUNTER — Ambulatory Visit: Payer: Medicare Other | Attending: Physical Medicine & Rehabilitation | Admitting: Physical Therapy

## 2014-08-26 DIAGNOSIS — Z9181 History of falling: Secondary | ICD-10-CM | POA: Insufficient documentation

## 2014-08-26 DIAGNOSIS — IMO0001 Reserved for inherently not codable concepts without codable children: Secondary | ICD-10-CM | POA: Insufficient documentation

## 2014-08-26 DIAGNOSIS — R269 Unspecified abnormalities of gait and mobility: Secondary | ICD-10-CM | POA: Insufficient documentation

## 2014-08-26 DIAGNOSIS — R42 Dizziness and giddiness: Secondary | ICD-10-CM | POA: Insufficient documentation

## 2014-08-28 ENCOUNTER — Encounter: Payer: Medicare Other | Admitting: Physical Therapy

## 2014-09-02 ENCOUNTER — Inpatient Hospital Stay (HOSPITAL_COMMUNITY)
Admission: EM | Admit: 2014-09-02 | Discharge: 2014-09-12 | DRG: 896 | Disposition: A | Discharge status: Home Health | Payer: Medicare Other | Attending: Internal Medicine | Admitting: Internal Medicine

## 2014-09-02 ENCOUNTER — Encounter (HOSPITAL_COMMUNITY): Payer: Self-pay | Admitting: Emergency Medicine

## 2014-09-02 ENCOUNTER — Emergency Department (HOSPITAL_COMMUNITY): Payer: Medicare Other

## 2014-09-02 DIAGNOSIS — F10939 Alcohol use, unspecified with withdrawal, unspecified: Secondary | ICD-10-CM | POA: Diagnosis not present

## 2014-09-02 DIAGNOSIS — H9190 Unspecified hearing loss, unspecified ear: Secondary | ICD-10-CM | POA: Diagnosis present

## 2014-09-02 DIAGNOSIS — R4689 Other symptoms and signs involving appearance and behavior: Secondary | ICD-10-CM | POA: Diagnosis present

## 2014-09-02 DIAGNOSIS — S0990XA Unspecified injury of head, initial encounter: Secondary | ICD-10-CM | POA: Diagnosis not present

## 2014-09-02 DIAGNOSIS — Y92009 Unspecified place in unspecified non-institutional (private) residence as the place of occurrence of the external cause: Secondary | ICD-10-CM

## 2014-09-02 DIAGNOSIS — F1020 Alcohol dependence, uncomplicated: Secondary | ICD-10-CM | POA: Diagnosis present

## 2014-09-02 DIAGNOSIS — F32A Depression, unspecified: Secondary | ICD-10-CM

## 2014-09-02 DIAGNOSIS — Z79899 Other long term (current) drug therapy: Secondary | ICD-10-CM

## 2014-09-02 DIAGNOSIS — F10931 Alcohol use, unspecified with withdrawal delirium: Secondary | ICD-10-CM | POA: Diagnosis not present

## 2014-09-02 DIAGNOSIS — E519 Thiamine deficiency, unspecified: Secondary | ICD-10-CM | POA: Diagnosis present

## 2014-09-02 DIAGNOSIS — R7401 Elevation of levels of liver transaminase levels: Secondary | ICD-10-CM

## 2014-09-02 DIAGNOSIS — R740 Nonspecific elevation of levels of transaminase and lactic acid dehydrogenase [LDH]: Secondary | ICD-10-CM

## 2014-09-02 DIAGNOSIS — I951 Orthostatic hypotension: Secondary | ICD-10-CM

## 2014-09-02 DIAGNOSIS — F17200 Nicotine dependence, unspecified, uncomplicated: Secondary | ICD-10-CM | POA: Diagnosis present

## 2014-09-02 DIAGNOSIS — R5383 Other fatigue: Secondary | ICD-10-CM | POA: Diagnosis present

## 2014-09-02 DIAGNOSIS — Z8546 Personal history of malignant neoplasm of prostate: Secondary | ICD-10-CM

## 2014-09-02 DIAGNOSIS — H9313 Tinnitus, bilateral: Secondary | ICD-10-CM

## 2014-09-02 DIAGNOSIS — F329 Major depressive disorder, single episode, unspecified: Secondary | ICD-10-CM

## 2014-09-02 DIAGNOSIS — E785 Hyperlipidemia, unspecified: Secondary | ICD-10-CM | POA: Diagnosis present

## 2014-09-02 DIAGNOSIS — Z800 Family history of malignant neoplasm of digestive organs: Secondary | ICD-10-CM

## 2014-09-02 DIAGNOSIS — Z888 Allergy status to other drugs, medicaments and biological substances status: Secondary | ICD-10-CM

## 2014-09-02 DIAGNOSIS — J690 Pneumonitis due to inhalation of food and vomit: Secondary | ICD-10-CM | POA: Diagnosis present

## 2014-09-02 DIAGNOSIS — F1010 Alcohol abuse, uncomplicated: Secondary | ICD-10-CM | POA: Diagnosis present

## 2014-09-02 DIAGNOSIS — F10229 Alcohol dependence with intoxication, unspecified: Secondary | ICD-10-CM | POA: Diagnosis present

## 2014-09-02 DIAGNOSIS — Z23 Encounter for immunization: Secondary | ICD-10-CM

## 2014-09-02 DIAGNOSIS — R7402 Elevation of levels of lactic acid dehydrogenase (LDH): Secondary | ICD-10-CM | POA: Diagnosis present

## 2014-09-02 DIAGNOSIS — Z91199 Patient's noncompliance with other medical treatment and regimen due to unspecified reason: Secondary | ICD-10-CM

## 2014-09-02 DIAGNOSIS — F3289 Other specified depressive episodes: Secondary | ICD-10-CM | POA: Diagnosis present

## 2014-09-02 DIAGNOSIS — Z9181 History of falling: Secondary | ICD-10-CM

## 2014-09-02 DIAGNOSIS — I10 Essential (primary) hypertension: Secondary | ICD-10-CM | POA: Diagnosis present

## 2014-09-02 DIAGNOSIS — F10239 Alcohol dependence with withdrawal, unspecified: Secondary | ICD-10-CM

## 2014-09-02 DIAGNOSIS — N179 Acute kidney failure, unspecified: Secondary | ICD-10-CM | POA: Diagnosis present

## 2014-09-02 DIAGNOSIS — J9600 Acute respiratory failure, unspecified whether with hypoxia or hypercapnia: Secondary | ICD-10-CM | POA: Diagnosis present

## 2014-09-02 DIAGNOSIS — R55 Syncope and collapse: Secondary | ICD-10-CM | POA: Diagnosis present

## 2014-09-02 DIAGNOSIS — S0101XA Laceration without foreign body of scalp, initial encounter: Secondary | ICD-10-CM | POA: Diagnosis present

## 2014-09-02 DIAGNOSIS — Z7982 Long term (current) use of aspirin: Secondary | ICD-10-CM

## 2014-09-02 DIAGNOSIS — Z9119 Patient's noncompliance with other medical treatment and regimen: Secondary | ICD-10-CM

## 2014-09-02 DIAGNOSIS — J9601 Acute respiratory failure with hypoxia: Secondary | ICD-10-CM

## 2014-09-02 DIAGNOSIS — C61 Malignant neoplasm of prostate: Secondary | ICD-10-CM | POA: Diagnosis not present

## 2014-09-02 DIAGNOSIS — Z833 Family history of diabetes mellitus: Secondary | ICD-10-CM

## 2014-09-02 DIAGNOSIS — F10231 Alcohol dependence with withdrawal delirium: Secondary | ICD-10-CM | POA: Diagnosis not present

## 2014-09-02 DIAGNOSIS — R5381 Other malaise: Secondary | ICD-10-CM | POA: Diagnosis present

## 2014-09-02 DIAGNOSIS — Z8042 Family history of malignant neoplasm of prostate: Secondary | ICD-10-CM

## 2014-09-02 DIAGNOSIS — S0100XA Unspecified open wound of scalp, initial encounter: Secondary | ICD-10-CM | POA: Diagnosis present

## 2014-09-02 DIAGNOSIS — T50995A Adverse effect of other drugs, medicaments and biological substances, initial encounter: Secondary | ICD-10-CM | POA: Diagnosis not present

## 2014-09-02 DIAGNOSIS — E872 Acidosis, unspecified: Secondary | ICD-10-CM | POA: Diagnosis not present

## 2014-09-02 DIAGNOSIS — M109 Gout, unspecified: Secondary | ICD-10-CM | POA: Diagnosis not present

## 2014-09-02 DIAGNOSIS — Z8673 Personal history of transient ischemic attack (TIA), and cerebral infarction without residual deficits: Secondary | ICD-10-CM

## 2014-09-02 DIAGNOSIS — W19XXXA Unspecified fall, initial encounter: Secondary | ICD-10-CM

## 2014-09-02 DIAGNOSIS — R279 Unspecified lack of coordination: Secondary | ICD-10-CM | POA: Diagnosis present

## 2014-09-02 DIAGNOSIS — A0472 Enterocolitis due to Clostridium difficile, not specified as recurrent: Secondary | ICD-10-CM | POA: Diagnosis present

## 2014-09-02 DIAGNOSIS — Z8249 Family history of ischemic heart disease and other diseases of the circulatory system: Secondary | ICD-10-CM

## 2014-09-02 DIAGNOSIS — R259 Unspecified abnormal involuntary movements: Secondary | ICD-10-CM | POA: Diagnosis present

## 2014-09-02 DIAGNOSIS — G479 Sleep disorder, unspecified: Secondary | ICD-10-CM | POA: Diagnosis not present

## 2014-09-02 DIAGNOSIS — E876 Hypokalemia: Secondary | ICD-10-CM | POA: Diagnosis not present

## 2014-09-02 DIAGNOSIS — F10232 Alcohol dependence with withdrawal with perceptual disturbance: Secondary | ICD-10-CM

## 2014-09-02 LAB — PROTIME-INR
INR: 1.04 (ref 0.00–1.49)
Prothrombin Time: 13.6 seconds (ref 11.6–15.2)

## 2014-09-02 LAB — I-STAT TROPONIN, ED: Troponin i, poc: 0 ng/mL (ref 0.00–0.08)

## 2014-09-02 LAB — APTT: aPTT: 31 seconds (ref 24–37)

## 2014-09-02 MED ORDER — VITAMIN B-1 100 MG PO TABS
100.0000 mg | ORAL_TABLET | Freq: Every day | ORAL | Status: DC
  Administered 2014-09-03 – 2014-09-12 (×9): 100 mg via ORAL
  Filled 2014-09-02 (×9): qty 1

## 2014-09-02 MED ORDER — LORAZEPAM 2 MG/ML IJ SOLN
0.0000 mg | Freq: Two times a day (BID) | INTRAMUSCULAR | Status: DC
  Administered 2014-09-03: 2 mg via INTRAVENOUS
  Filled 2014-09-02: qty 1

## 2014-09-02 MED ORDER — LORAZEPAM 2 MG/ML IJ SOLN
0.0000 mg | Freq: Four times a day (QID) | INTRAMUSCULAR | Status: DC
  Administered 2014-09-03 (×2): 3 mg via INTRAVENOUS
  Filled 2014-09-02 (×2): qty 2

## 2014-09-02 MED ORDER — THIAMINE HCL 100 MG/ML IJ SOLN
Freq: Once | INTRAVENOUS | Status: AC
  Administered 2014-09-03: via INTRAVENOUS
  Filled 2014-09-02: qty 1000

## 2014-09-02 MED ORDER — THIAMINE HCL 100 MG/ML IJ SOLN: 100.0000 mg | Freq: Every day | INTRAMUSCULAR | Status: DC

## 2014-09-02 MED ORDER — LORAZEPAM 1 MG PO TABS
0.0000 mg | ORAL_TABLET | Freq: Four times a day (QID) | ORAL | Status: DC
  Administered 2014-09-03: 1 mg via ORAL
  Filled 2014-09-02: qty 1
  Filled 2014-09-02: qty 2

## 2014-09-02 MED ORDER — LORAZEPAM 1 MG PO TABS
0.0000 mg | ORAL_TABLET | Freq: Two times a day (BID) | ORAL | Status: DC
  Administered 2014-09-03: 1 mg via ORAL
  Filled 2014-09-02: qty 1

## 2014-09-02 NOTE — ED Provider Notes (Signed)
CSN: [id number]     Arrival date & time 09/02/14  2008 History   First MD Initiated Contact with Patient 09/02/14 2231     Chief Complaint  Patient presents with  . Alcohol Intoxication  . Detox      (Consider location/radiation/quality/duration/timing/severity/associated sxs/prior Treatment) HPI This patient presents with falls in his home. He has friends with him who reports that they have visited him and he has not recalled them being there and he has also been falling very frequently. Apparently had fallen sometime today and they went over to his house and found him lying on the floor. The patient is a heavy drinker. He did go through detox had quit for about a week but now has rebounded and been drinking heavily for a number of days. The patient reports that he has some tremoring right now and he has had significant withdrawal in the past when he has tried to quit. The patient had been admitted about 2 months ago with a combination of a stroke and fall associated with alcoholism as well. The patient recognizes that he has very poor coordination and experiences frequent falls. 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  Past Medical History  Diagnosis Date  . Prostate cancer 2010    External beam radiation (urol - Risa Grill, XRT Valere Dross)  . Stroke   . Depression    Past Surgical History  Procedure Laterality Date  . None     Family History  Problem Relation Age of Onset  . Cancer Mother     esophageal  . Hypertension Mother   . Diabetes Mother   . Cancer Father     prostate  . Heart disease Maternal Grandfather     MI  . Cancer Paternal Grandfather     prostate   History  Substance Use Topics  . Smoking status: Current Every Day Smoker -- 1.00 packs/day for 30 years    Types: Cigarettes  . Smokeless tobacco: Never Used  . Alcohol Use: 3.5 oz/week    7 drink(s) per week   Comment: 14 oz per day    Review of Systems  10 Systems reviewed and are negative for acute change except as noted in the HPI.   Allergies  Celebrex  Home Medications   Prior to Admission medications   Medication Sig Start Date End Date Taking? Authorizing Provider  amLODipine (NORVASC) 10 MG tablet Take 1 tablet (10 mg total) by mouth daily. 07/11/14  Yes Daniel J Angiulli, PA-C  aspirin 81 MG EC tablet Take 1 tablet (81 mg total) by mouth daily. 07/11/14  Yes Daniel J Angiulli, PA-C  atorvastatin (LIPITOR) 10 MG tablet Take 1 tablet (10 mg total) by mouth daily. 07/11/14  Yes Daniel J Angiulli, PA-C  fluticasone (FLONASE) 50 MCG/ACT nasal spray Place 1 spray into both nostrils daily. 07/11/14  Yes Daniel J Angiulli, PA-C  folic acid (FOLVITE) 1 MG tablet Take 1 tablet (1 mg total) by mouth daily. 07/11/14  Yes Daniel J Angiulli, PA-C  loratadine (CLARITIN) 10 MG tablet Take 1 tablet (10 mg total) by mouth daily. 07/11/14  Yes Daniel J Angiulli, PA-C  LORazepam (ATIVAN) 1 MG tablet Take 1 tablet (1 mg total) by mouth 3 (three) times daily. 07/11/14  Yes Daniel J Angiulli, PA-C  mirtazapine (REMERON) 7.5 MG tablet Take 1 tablet (7.5 mg total) by mouth at bedtime. 07/11/14  Yes  Lavon Paganini Angiulli, PA-C  tamsulosin (FLOMAX) 0.4 MG CAPS capsule Take 1 capsule (0.4 mg total) by mouth daily. 07/11/14  Yes Daniel J Angiulli, PA-C  Vortioxetine HBr (BRINTELLIX) 10 MG TABS Take 10 mg by mouth daily.   Yes Historical Provider, MD   BP 135/84  Pulse 79  Temp(Src) 98.1 F (36.7 C) (Oral)  Resp 18  SpO2 96% Physical Exam The patient is mildly disheveled. He has a sleeping of the stretcher but awakens appropriately to converse. Head: Patient has a contusion to the right brow. No associated laceration. Eyes: Sclerae are injected. Pupils are symmetric. Patient has mild lateral nystagmus bilaterally. Mouth: Airway is widely patent Neck : No C-spine tenderness  Lungs : Clear without gross wheeze rhonchi  or  Heart: Tachycardia no murmur gallop  Abdomen: Soft nontender  Extremities : The lower she does have multiple contusions of the knees and lower legs he is using all 4 extremities purposefully and coordinated without deformities  Neurologic: The patient's speech is intact. Cranial nerves are intact. patient has a tremor with handheld an out stretched. Gait testing: the patient has a very and coordinated broad-based gait with the appearance of a significant fall risk.  ED Course  Procedures (including critical care time) Labs Review Labs Reviewed  COMPREHENSIVE METABOLIC PANEL - Abnormal; Notable for the following:    Glucose, Bld 111 (*)    Total Protein 9.0 (*)    AST 192 (*)    ALT 202 (*)    GFR calc non Af Amer 88 (*)    Anion gap 21 (*)    All other components within normal limits  CBC WITH DIFFERENTIAL - Abnormal; Notable for the following:    MCH 35.6 (*)    All other components within normal limits  URINALYSIS, ROUTINE W REFLEX MICROSCOPIC - Abnormal; Notable for the following:    Color, Urine AMBER (*)    All other components within normal limits  ETHANOL - Abnormal; Notable for the following:    Alcohol, Ethyl (B) 313 (*)    All other components within normal limits  APTT  PROTIME-INR  MAGNESIUM  PHOSPHORUS  URINE RAPID DRUG SCREEN (HOSP PERFORMED)  I-STAT TROPOININ, ED  TYPE AND SCREEN  ABO/RH    Imaging Review Ct Head Wo Contrast  09/03/2014   CLINICAL DATA:  ETOH. Fall. History of stroke. Prostate cancer in 2010.  EXAM: CT HEAD WITHOUT CONTRAST  TECHNIQUE: Contiguous axial images were obtained from the base of the skull through the vertex without intravenous contrast.  COMPARISON:  07/04/2014  FINDINGS: There is mild central and cortical atrophy. Periventricular white matter changes are consistent with small vessel disease. There is no evidence for hemorrhage, mass lesion, or acute infarction. There is moderate bilateral maxillary sinus disease. Suspect caries.  No calvarial fracture.  IMPRESSION: 1. Atrophy and small vessel disease. 2.  No evidence for acute intracranial abnormality. 3. Bilateral sinus disease. 4. Suspect caries.   Electronically Signed   By: Shon Hale M.D.   On: 09/03/2014 00:53     EKG Interpretation None     CRITICAL CARE Performed by: Charlesetta Shanks   Total critical care time: 30  Critical care time was exclusive of separately billable procedures and treating other patients.  Critical care was necessary to treat or prevent imminent or life-threatening deterioration.  Critical care was time spent personally by me on the following activities: development of treatment plan with patient and/or surrogate as well as nursing, discussions with consultants, evaluation  of patient's response to treatment, examination of patient, obtaining history from patient or surrogate, ordering and performing treatments and interventions, ordering and review of laboratory studies, ordering and review of radiographic studies, pulse oximetry and re-evaluation of patient's condition.  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

## 2014-09-02 NOTE — ED Notes (Signed)
Pt states that he fell today and that is why he is here. Friends found him on the floor. States he has been drinking more heavily since Christmas but also has vertigo. States he's had 3-4 drinks of vodka tongight. Alert.

## 2014-09-03 ENCOUNTER — Encounter (HOSPITAL_COMMUNITY): Payer: Self-pay

## 2014-09-03 DIAGNOSIS — R279 Unspecified lack of coordination: Secondary | ICD-10-CM | POA: Diagnosis present

## 2014-09-03 DIAGNOSIS — F10231 Alcohol dependence with withdrawal delirium: Secondary | ICD-10-CM | POA: Diagnosis present

## 2014-09-03 DIAGNOSIS — F3289 Other specified depressive episodes: Secondary | ICD-10-CM

## 2014-09-03 DIAGNOSIS — J189 Pneumonia, unspecified organism: Secondary | ICD-10-CM | POA: Diagnosis not present

## 2014-09-03 DIAGNOSIS — R5381 Other malaise: Secondary | ICD-10-CM | POA: Diagnosis present

## 2014-09-03 DIAGNOSIS — IMO0002 Reserved for concepts with insufficient information to code with codable children: Secondary | ICD-10-CM

## 2014-09-03 DIAGNOSIS — M109 Gout, unspecified: Secondary | ICD-10-CM | POA: Diagnosis not present

## 2014-09-03 DIAGNOSIS — F10939 Alcohol use, unspecified with withdrawal, unspecified: Secondary | ICD-10-CM | POA: Diagnosis present

## 2014-09-03 DIAGNOSIS — Z8042 Family history of malignant neoplasm of prostate: Secondary | ICD-10-CM | POA: Diagnosis not present

## 2014-09-03 DIAGNOSIS — J9819 Other pulmonary collapse: Secondary | ICD-10-CM | POA: Diagnosis not present

## 2014-09-03 DIAGNOSIS — J9600 Acute respiratory failure, unspecified whether with hypoxia or hypercapnia: Secondary | ICD-10-CM | POA: Diagnosis not present

## 2014-09-03 DIAGNOSIS — H9319 Tinnitus, unspecified ear: Secondary | ICD-10-CM

## 2014-09-03 DIAGNOSIS — R5383 Other fatigue: Secondary | ICD-10-CM | POA: Diagnosis present

## 2014-09-03 DIAGNOSIS — Z8673 Personal history of transient ischemic attack (TIA), and cerebral infarction without residual deficits: Secondary | ICD-10-CM | POA: Diagnosis not present

## 2014-09-03 DIAGNOSIS — Z888 Allergy status to other drugs, medicaments and biological substances status: Secondary | ICD-10-CM | POA: Diagnosis not present

## 2014-09-03 DIAGNOSIS — F10229 Alcohol dependence with intoxication, unspecified: Secondary | ICD-10-CM | POA: Diagnosis present

## 2014-09-03 DIAGNOSIS — I10 Essential (primary) hypertension: Secondary | ICD-10-CM | POA: Diagnosis present

## 2014-09-03 DIAGNOSIS — R55 Syncope and collapse: Secondary | ICD-10-CM | POA: Diagnosis present

## 2014-09-03 DIAGNOSIS — F329 Major depressive disorder, single episode, unspecified: Secondary | ICD-10-CM

## 2014-09-03 DIAGNOSIS — E876 Hypokalemia: Secondary | ICD-10-CM | POA: Diagnosis not present

## 2014-09-03 DIAGNOSIS — R7402 Elevation of levels of lactic acid dehydrogenase (LDH): Secondary | ICD-10-CM | POA: Diagnosis present

## 2014-09-03 DIAGNOSIS — Z91199 Patient's noncompliance with other medical treatment and regimen due to unspecified reason: Secondary | ICD-10-CM

## 2014-09-03 DIAGNOSIS — E872 Acidosis, unspecified: Secondary | ICD-10-CM | POA: Diagnosis not present

## 2014-09-03 DIAGNOSIS — W19XXXA Unspecified fall, initial encounter: Secondary | ICD-10-CM | POA: Diagnosis present

## 2014-09-03 DIAGNOSIS — Y92009 Unspecified place in unspecified non-institutional (private) residence as the place of occurrence of the external cause: Secondary | ICD-10-CM

## 2014-09-03 DIAGNOSIS — R740 Nonspecific elevation of levels of transaminase and lactic acid dehydrogenase [LDH]: Secondary | ICD-10-CM

## 2014-09-03 DIAGNOSIS — G479 Sleep disorder, unspecified: Secondary | ICD-10-CM | POA: Diagnosis not present

## 2014-09-03 DIAGNOSIS — N179 Acute kidney failure, unspecified: Secondary | ICD-10-CM | POA: Diagnosis present

## 2014-09-03 DIAGNOSIS — Z800 Family history of malignant neoplasm of digestive organs: Secondary | ICD-10-CM | POA: Diagnosis not present

## 2014-09-03 DIAGNOSIS — S0100XA Unspecified open wound of scalp, initial encounter: Secondary | ICD-10-CM | POA: Diagnosis present

## 2014-09-03 DIAGNOSIS — F1020 Alcohol dependence, uncomplicated: Secondary | ICD-10-CM | POA: Diagnosis present

## 2014-09-03 DIAGNOSIS — Z8249 Family history of ischemic heart disease and other diseases of the circulatory system: Secondary | ICD-10-CM | POA: Diagnosis not present

## 2014-09-03 DIAGNOSIS — F17200 Nicotine dependence, unspecified, uncomplicated: Secondary | ICD-10-CM | POA: Diagnosis present

## 2014-09-03 DIAGNOSIS — Z79899 Other long term (current) drug therapy: Secondary | ICD-10-CM | POA: Diagnosis not present

## 2014-09-03 DIAGNOSIS — Z23 Encounter for immunization: Secondary | ICD-10-CM | POA: Diagnosis not present

## 2014-09-03 DIAGNOSIS — J690 Pneumonitis due to inhalation of food and vomit: Secondary | ICD-10-CM | POA: Diagnosis not present

## 2014-09-03 DIAGNOSIS — R0602 Shortness of breath: Secondary | ICD-10-CM | POA: Diagnosis not present

## 2014-09-03 DIAGNOSIS — H9190 Unspecified hearing loss, unspecified ear: Secondary | ICD-10-CM | POA: Diagnosis present

## 2014-09-03 DIAGNOSIS — Z9181 History of falling: Secondary | ICD-10-CM | POA: Diagnosis not present

## 2014-09-03 DIAGNOSIS — R509 Fever, unspecified: Secondary | ICD-10-CM | POA: Diagnosis not present

## 2014-09-03 DIAGNOSIS — E785 Hyperlipidemia, unspecified: Secondary | ICD-10-CM | POA: Diagnosis present

## 2014-09-03 DIAGNOSIS — J180 Bronchopneumonia, unspecified organism: Secondary | ICD-10-CM | POA: Diagnosis not present

## 2014-09-03 DIAGNOSIS — T50995A Adverse effect of other drugs, medicaments and biological substances, initial encounter: Secondary | ICD-10-CM | POA: Diagnosis not present

## 2014-09-03 DIAGNOSIS — R259 Unspecified abnormal involuntary movements: Secondary | ICD-10-CM | POA: Diagnosis present

## 2014-09-03 DIAGNOSIS — Z8546 Personal history of malignant neoplasm of prostate: Secondary | ICD-10-CM | POA: Diagnosis not present

## 2014-09-03 DIAGNOSIS — F10239 Alcohol dependence with withdrawal, unspecified: Secondary | ICD-10-CM

## 2014-09-03 DIAGNOSIS — A0472 Enterocolitis due to Clostridium difficile, not specified as recurrent: Secondary | ICD-10-CM | POA: Diagnosis present

## 2014-09-03 DIAGNOSIS — E519 Thiamine deficiency, unspecified: Secondary | ICD-10-CM | POA: Diagnosis present

## 2014-09-03 DIAGNOSIS — R918 Other nonspecific abnormal finding of lung field: Secondary | ICD-10-CM | POA: Diagnosis not present

## 2014-09-03 DIAGNOSIS — Z9119 Patient's noncompliance with other medical treatment and regimen: Secondary | ICD-10-CM

## 2014-09-03 DIAGNOSIS — R7401 Elevation of levels of liver transaminase levels: Secondary | ICD-10-CM

## 2014-09-03 DIAGNOSIS — Z7982 Long term (current) use of aspirin: Secondary | ICD-10-CM | POA: Diagnosis not present

## 2014-09-03 DIAGNOSIS — F10931 Alcohol use, unspecified with withdrawal delirium: Secondary | ICD-10-CM | POA: Diagnosis present

## 2014-09-03 DIAGNOSIS — R0682 Tachypnea, not elsewhere classified: Secondary | ICD-10-CM | POA: Diagnosis not present

## 2014-09-03 DIAGNOSIS — Z833 Family history of diabetes mellitus: Secondary | ICD-10-CM | POA: Diagnosis not present

## 2014-09-03 LAB — COMPREHENSIVE METABOLIC PANEL
ALBUMIN: 4.8 g/dL (ref 3.5–5.2)
ALK PHOS: 95 U/L (ref 39–117)
ALK PHOS: 79 U/L (ref 39–117)
ALT: 202 U/L — AB (ref 0–53)
ALT: 154 U/L — AB (ref 0–53)
AST: 192 U/L — AB (ref 0–37)
AST: 147 U/L — ABNORMAL HIGH (ref 0–37)
Albumin: 4 g/dL (ref 3.5–5.2)
Anion gap: 21 — ABNORMAL HIGH (ref 5–15)
Anion gap: 22 — ABNORMAL HIGH (ref 5–15)
BILIRUBIN TOTAL: 0.8 mg/dL (ref 0.3–1.2)
BUN: 15 mg/dL (ref 6–23)
BUN: 15 mg/dL (ref 6–23)
CHLORIDE: 99 meq/L (ref 96–112)
CO2: 23 mEq/L (ref 19–32)
CO2: 21 mEq/L (ref 19–32)
Calcium: 10 mg/dL (ref 8.4–10.5)
Calcium: 9.6 mg/dL (ref 8.4–10.5)
Chloride: 98 mEq/L (ref 96–112)
Creatinine, Ser: 0.87 mg/dL (ref 0.50–1.35)
Creatinine, Ser: 0.83 mg/dL (ref 0.50–1.35)
GFR calc Af Amer: >90 mL/min (ref >90)
GFR calc non Af Amer: 88 mL/min — ABNORMAL LOW (ref >90)
GFR calc non Af Amer: 90 mL/min — ABNORMAL LOW (ref >90)
GLUCOSE: 111 mg/dL — AB (ref 70–99)
Glucose, Bld: 83 mg/dL (ref 70–99)
POTASSIUM: 3.8 meq/L (ref 3.7–5.3)
Potassium: 3.5 mEq/L — ABNORMAL LOW (ref 3.7–5.3)
SODIUM: 142 meq/L (ref 137–147)
SODIUM: 142 meq/L (ref 137–147)
TOTAL PROTEIN: 9 g/dL — AB (ref 6.0–8.3)
Total Bilirubin: 0.9 mg/dL (ref 0.3–1.2)
Total Protein: 7.5 g/dL (ref 6.0–8.3)

## 2014-09-03 LAB — HEMOGLOBIN A1C
Hgb A1c MFr Bld: 5.7 % — ABNORMAL HIGH (ref <5.7)
Mean Plasma Glucose: 117 mg/dL — ABNORMAL HIGH (ref <117)

## 2014-09-03 LAB — RAPID URINE DRUG SCREEN, HOSP PERFORMED
AMPHETAMINES: NOT DETECTED
BENZODIAZEPINES: NOT DETECTED
Barbiturates: NOT DETECTED
Cocaine: NOT DETECTED
OPIATES: NOT DETECTED
TETRAHYDROCANNABINOL: NOT DETECTED

## 2014-09-03 LAB — CBC WITH DIFFERENTIAL/PLATELET
BASOS PCT: 1 % (ref 0–1)
Basophils Absolute: 0.1 10*3/uL (ref 0.0–0.1)
EOS ABS: 0.1 10*3/uL (ref 0.0–0.7)
Eosinophils Relative: 1 % (ref 0–5)
HCT: 46.8 % (ref 39.0–52.0)
HEMOGLOBIN: 16.7 g/dL (ref 13.0–17.0)
Lymphocytes Relative: 28 % (ref 12–46)
Lymphs Abs: 1.9 10*3/uL (ref 0.7–4.0)
MCH: 35.6 pg — AB (ref 26.0–34.0)
MCHC: 35.7 g/dL (ref 30.0–36.0)
MCV: 99.8 fL (ref 78.0–100.0)
MONOS PCT: 12 % (ref 3–12)
Monocytes Absolute: 0.8 10*3/uL (ref 0.1–1.0)
Neutro Abs: 4 10*3/uL (ref 1.7–7.7)
Neutrophils Relative %: 58 % (ref 43–77)
Platelets: 229 10*3/uL (ref 150–400)
RBC: 4.69 MIL/uL (ref 4.22–5.81)
RDW: 14.1 % (ref 11.5–15.5)
WBC: 6.8 10*3/uL (ref 4.0–10.5)

## 2014-09-03 LAB — PRO B NATRIURETIC PEPTIDE: Pro B Natriuretic peptide (BNP): 12.6 pg/mL (ref 0–125)

## 2014-09-03 LAB — PROTIME-INR
INR: 1.05 (ref 0.00–1.49)
INR: 1.05 (ref 0.00–1.49)
PROTHROMBIN TIME: 13.7 s (ref 11.6–15.2)
Prothrombin Time: 13.7 seconds (ref 11.6–15.2)

## 2014-09-03 LAB — VITAMIN B12: Vitamin B-12: 894 pg/mL (ref 211–911)

## 2014-09-03 LAB — CBC
HCT: 41.7 % (ref 39.0–52.0)
Hemoglobin: 14.6 g/dL (ref 13.0–17.0)
MCH: 35 pg — AB (ref 26.0–34.0)
MCHC: 35 g/dL (ref 30.0–36.0)
MCV: 100 fL (ref 78.0–100.0)
PLATELETS: 202 10*3/uL (ref 150–400)
RBC: 4.17 MIL/uL — AB (ref 4.22–5.81)
RDW: 14.1 % (ref 11.5–15.5)
WBC: 6 10*3/uL (ref 4.0–10.5)

## 2014-09-03 LAB — URINALYSIS, ROUTINE W REFLEX MICROSCOPIC
Bilirubin Urine: NEGATIVE
GLUCOSE, UA: NEGATIVE mg/dL
HGB URINE DIPSTICK: NEGATIVE
Ketones, ur: NEGATIVE mg/dL
Leukocytes, UA: NEGATIVE
Nitrite: NEGATIVE
PROTEIN: NEGATIVE mg/dL
SPECIFIC GRAVITY, URINE: 1.016 (ref 1.005–1.030)
Urobilinogen, UA: 0.2 mg/dL (ref 0.0–1.0)
pH: 5.5 (ref 5.0–8.0)

## 2014-09-03 LAB — TROPONIN I: Troponin I: <0.3 ng/mL (ref <0.30)

## 2014-09-03 LAB — MRSA PCR SCREENING: MRSA by PCR: NEGATIVE

## 2014-09-03 LAB — HEPATITIS C ANTIBODY: HCV Ab: NEGATIVE

## 2014-09-03 LAB — MAGNESIUM
MAGNESIUM: 2.3 mg/dL (ref 1.5–2.5)
Magnesium: 1.9 mg/dL (ref 1.5–2.5)

## 2014-09-03 LAB — ETHANOL: ALCOHOL ETHYL (B): 313 mg/dL — AB (ref 0–11)

## 2014-09-03 LAB — TSH: TSH: 0.979 u[IU]/mL (ref 0.350–4.500)

## 2014-09-03 LAB — FOLATE RBC: RBC Folate: 1022 ng/mL — ABNORMAL HIGH (ref >280)

## 2014-09-03 LAB — PHOSPHORUS
PHOSPHORUS: 2.8 mg/dL (ref 2.3–4.6)
Phosphorus: 3.2 mg/dL (ref 2.3–4.6)

## 2014-09-03 LAB — ABO/RH: ABO/RH(D): B NEG

## 2014-09-03 MED ORDER — FLUTICASONE PROPIONATE 50 MCG/ACT NA SUSP
1.0000 | Freq: Every day | NASAL | Status: DC
  Administered 2014-09-03 – 2014-09-12 (×10): 1 via NASAL
  Filled 2014-09-03: qty 16

## 2014-09-03 MED ORDER — VITAMIN B-1 100 MG PO TABS: 100.0000 mg | ORAL_TABLET | Freq: Every day | ORAL | Status: DC

## 2014-09-03 MED ORDER — THIAMINE HCL 100 MG/ML IJ SOLN
100.0000 mg | Freq: Every day | INTRAMUSCULAR | Status: DC
Start: 2014-09-03 — End: 2014-09-03

## 2014-09-03 MED ORDER — LORAZEPAM 1 MG PO TABS
2.0000 mg | ORAL_TABLET | ORAL | Status: AC
  Administered 2014-09-03 – 2014-09-04 (×3): 2 mg via ORAL
  Filled 2014-09-03 (×3): qty 2

## 2014-09-03 MED ORDER — LORAZEPAM 2 MG/ML IJ SOLN
1.0000 mg | INTRAMUSCULAR | Status: DC
Start: 2014-09-03 — End: 2014-09-03

## 2014-09-03 MED ORDER — FOLIC ACID 1 MG PO TABS: 1.0000 mg | ORAL_TABLET | Freq: Every day | ORAL | Status: DC

## 2014-09-03 MED ORDER — FOLIC ACID 5 MG/ML IJ SOLN
1.0000 mg | Freq: Every day | INTRAMUSCULAR | Status: DC
  Administered 2014-09-03: 1 mg via INTRAVENOUS
  Filled 2014-09-03 (×3): qty 0.2

## 2014-09-03 MED ORDER — HYDRALAZINE HCL 20 MG/ML IJ SOLN
5.0000 mg | Freq: Four times a day (QID) | INTRAMUSCULAR | Status: DC | PRN
  Administered 2014-09-03 – 2014-09-08 (×6): 5 mg via INTRAVENOUS
  Filled 2014-09-03 (×6): qty 1

## 2014-09-03 MED ORDER — LORAZEPAM 2 MG/ML IJ SOLN
0.0000 mg | Freq: Two times a day (BID) | INTRAMUSCULAR | Status: AC
  Administered 2014-09-06 – 2014-09-07 (×3): 2 mg via INTRAVENOUS
  Filled 2014-09-03 (×4): qty 1

## 2014-09-03 MED ORDER — ADULT MULTIVITAMIN W/MINERALS CH
1.0000 | ORAL_TABLET | Freq: Every day | ORAL | Status: DC
  Administered 2014-09-03 – 2014-09-12 (×9): 1 via ORAL
  Filled 2014-09-03 (×9): qty 1

## 2014-09-03 MED ORDER — ATORVASTATIN CALCIUM 10 MG PO TABS
10.0000 mg | ORAL_TABLET | Freq: Every day | ORAL | Status: DC
  Administered 2014-09-03 – 2014-09-11 (×8): 10 mg via ORAL
  Filled 2014-09-03 (×10): qty 1

## 2014-09-03 MED ORDER — MIRTAZAPINE 7.5 MG PO TABS
7.5000 mg | ORAL_TABLET | Freq: Every day | ORAL | Status: DC
  Administered 2014-09-03 – 2014-09-10 (×9): 7.5 mg via ORAL
  Filled 2014-09-03 (×11): qty 1

## 2014-09-03 MED ORDER — LORAZEPAM 1 MG PO TABS
1.0000 mg | ORAL_TABLET | Freq: Four times a day (QID) | ORAL | Status: AC | PRN
Start: 2014-09-03 — End: 2014-09-06
  Administered 2014-09-06: 1 mg via ORAL
  Filled 2014-09-03: qty 1

## 2014-09-03 MED ORDER — VORTIOXETINE HBR 10 MG PO TABS: 10.0000 mg | ORAL_TABLET | Freq: Every day | ORAL | Status: DC

## 2014-09-03 MED ORDER — LORAZEPAM 2 MG/ML IJ SOLN
1.0000 mg | Freq: Four times a day (QID) | INTRAMUSCULAR | Status: AC | PRN
  Administered 2014-09-04 (×2): 1 mg via INTRAVENOUS
  Administered 2014-09-06: 2 mg via INTRAVENOUS
  Filled 2014-09-03 (×2): qty 1

## 2014-09-03 MED ORDER — LORAZEPAM 1 MG PO TABS: 1.0000 mg | ORAL_TABLET | ORAL | Status: DC

## 2014-09-03 MED ORDER — POTASSIUM CHLORIDE CRYS ER 20 MEQ PO TBCR
60.0000 meq | EXTENDED_RELEASE_TABLET | Freq: Once | ORAL | Status: AC
  Administered 2014-09-03: 60 meq via ORAL
  Filled 2014-09-03: qty 3

## 2014-09-03 MED ORDER — ZOLPIDEM TARTRATE 5 MG PO TABS
5.0000 mg | ORAL_TABLET | Freq: Every evening | ORAL | Status: DC | PRN
Start: 2014-09-03 — End: 2014-09-12
  Administered 2014-09-04 – 2014-09-09 (×6): 5 mg via ORAL
  Filled 2014-09-03 (×6): qty 1

## 2014-09-03 MED ORDER — LORAZEPAM 2 MG/ML IJ SOLN
2.0000 mg | INTRAMUSCULAR | Status: AC
  Administered 2014-09-03 – 2014-09-06 (×9): 2 mg via INTRAVENOUS
  Filled 2014-09-03 (×11): qty 1

## 2014-09-03 MED ORDER — SODIUM CHLORIDE 0.9 % IV SOLN
INTRAVENOUS | Status: DC
  Administered 2014-09-03 – 2014-09-05 (×2): via INTRAVENOUS

## 2014-09-03 MED ORDER — INFLUENZA VAC SPLIT QUAD 0.5 ML IM SUSY
0.5000 mL | PREFILLED_SYRINGE | INTRAMUSCULAR | Status: AC
  Administered 2014-09-04: 0.5 mL via INTRAMUSCULAR
  Filled 2014-09-03 (×2): qty 0.5

## 2014-09-03 MED ORDER — LORAZEPAM 2 MG/ML IJ SOLN
0.0000 mg | Freq: Four times a day (QID) | INTRAMUSCULAR | Status: AC
  Administered 2014-09-03 – 2014-09-04 (×5): 2 mg via INTRAVENOUS
  Administered 2014-09-05: 4 mg via INTRAVENOUS
  Filled 2014-09-03: qty 1
  Filled 2014-09-03: qty 2
  Filled 2014-09-03 (×3): qty 1

## 2014-09-03 MED ORDER — LORATADINE 10 MG PO TABS
10.0000 mg | ORAL_TABLET | Freq: Every day | ORAL | Status: DC
  Administered 2014-09-03 – 2014-09-12 (×9): 10 mg via ORAL
  Filled 2014-09-03 (×9): qty 1

## 2014-09-03 NOTE — H&P (Signed)
[facility] History and Physical  Joshua Henson:[id number] DOB: Apr 20, 1946 DOA: 09/02/2014  Referring physician: PCP: Adella Hare, MD  Specialists:   Chief Complaint: Alcoholic withdrawal  HPI: Joshua Henson is a 68 y.o. WM PMHx depression, alcohol abuse, HX stroke, Hx prostate cancer, HTN, HLD,  presents with falls in his home. He has friends with him who reports that they have visited him and he has not recalled them being there and he has also been falling very frequently. Apparently had fallen sometime today and they went over to his house and found him lying on the floor. The patient is a heavy drinker. He did go through detox had quit for about a week but now has rebounded and been drinking heavily for a number of days. The patient reports that he has some tremoring right now and he has had significant withdrawal in the past when he has tried to quit. The patient had been admitted about 2 months ago with a combination of a stroke and fall associated with alcoholism as well. The patient recognizes that he has very poor coordination and experiences frequent falls. 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   Review of Systems: The patient denies anorexia, fever, weight loss,, vision loss, hoarseness, chest pain, syncope, dyspnea on exertion, peripheral edema, hemoptysis, abdominal pain, melena, hematochezia, severe indigestion/heartburn, hematuria, incontinence, genital sores, muscle weakness, suspicious skin lesions, transient blindness, unusual weight change, abnormal bleeding, enlarged lymph nodes, angioedema, and breast masses.    TRAVEL HISTORY: NA   Consultants:  NA  Procedure/Significant Events:  7/15 echocardiogram;Left ventricle: Systolic function normal. 9/16 CT head without contrast;. Atrophy and small vessel disease.- No evidence for acute intracranial abnormality.      Culture  NA   Antibiotics:  NA   DVT prophylaxis:  SCD  Devices  NA   LINES / TUBES:     Past Medical History  Diagnosis Date  . Prostate cancer 2010    External beam radiation (urol - Risa Grill, XRT Valere Dross)  . Stroke   . Depression    Past Surgical History  Procedure Laterality Date  . None     Social History: 30 pack-year smoking history. He has never used smokeless tobacco. He reports that he drinks about 3.5 ounces of alcohol per week (note EtOH level>300 currently). He reports that he does not use illicit drugs.    Allergies  Allergen Reactions  . Celebrex [Celecoxib] Rash    Family History  Problem Relation Age of Onset  . Cancer Mother     esophageal  . Hypertension Mother   . Diabetes Mother   . Cancer Father     prostate  . Heart disease Maternal Grandfather     MI  . Cancer Paternal Grandfather     prostate     Prior to Admission medications   Medication Sig Start Date End Date Taking? Authorizing Provider  amLODipine (NORVASC) 10 MG tablet Take 1 tablet (10 mg total) by mouth daily. 07/11/14  Yes Daniel J Angiulli, PA-C  aspirin 81 MG EC tablet Take 1 tablet (81 mg total) by mouth daily. 07/11/14  Yes Daniel J Angiulli, PA-C  atorvastatin (LIPITOR) 10 MG tablet Take 1 tablet (10 mg total) by mouth daily. 07/11/14  Yes Daniel J Angiulli, PA-C  fluticasone (FLONASE) 50 MCG/ACT nasal spray Place 1 spray into both nostrils daily. 07/11/14  Yes Lavon Paganini  Angiulli, PA-C  folic acid (FOLVITE) 1 MG tablet Take 1 tablet (1 mg total) by mouth daily. 07/11/14  Yes Daniel J Angiulli, PA-C  loratadine (CLARITIN) 10 MG tablet Take 1 tablet (10 mg total) by mouth daily. 07/11/14  Yes Daniel J Angiulli, PA-C  LORazepam (ATIVAN) 1 MG tablet Take 1 tablet (1 mg total) by mouth 3 (three) times daily. 07/11/14  Yes Daniel J Angiulli, PA-C  mirtazapine (REMERON) 7.5 MG tablet Take 1 tablet (7.5 mg total) by mouth at bedtime. 07/11/14  Yes Daniel J Angiulli, PA-C   tamsulosin (FLOMAX) 0.4 MG CAPS capsule Take 1 capsule (0.4 mg total) by mouth daily. 07/11/14  Yes Daniel J Angiulli, PA-C  Vortioxetine HBr (BRINTELLIX) 10 MG TABS Take 10 mg by mouth daily.   Yes Historical Provider, MD   Physical Exam: Filed Vitals:   09/02/14 2027 09/02/14 2357 09/03/14 0002  BP: 139/95 135/84 135/84  Pulse: 89 79 79  Temp: 98.5 F (36.9 C) 98.1 F (36.7 C)   TempSrc: Oral Oral   Resp:  18   SpO2: 98% 96%      General:  A./O. x4, NAD, smells of alcohol.  Eyes: Pupils equal react to light accommodation  Neck: Negative JVD, negative lymphadenopathy  Cardiovascular: Regular rhythm and rate, negative murmurs rubs or gallops, normal S1/S2  Respiratory: Clear to auscultation bilateral  Abdomen: Mildly distended, nontender, plus bowel sound  Musculoskeletal: Negative pedal edema, negative cyanosis  Psychiatric: Very poor understanding of his disease process  Neurologic: Cranial nerves II through XII intact, tongue/uvula midline, extremity strength 5/5, sensation intact, positive bilateral asterixis, did not ambulate patient secondary to being intoxicated.  Labs on Admission:  Basic Metabolic Panel:  Recent Labs Lab 09/02/14 2316  NA 142  K 3.8  CL 98  CO2 23  GLUCOSE 111*  BUN 15  CREATININE 0.87  CALCIUM 10.0  MG 2.3  PHOS 3.2   Liver Function Tests:  Recent Labs Lab 09/02/14 2316  AST 192*  ALT 202*  ALKPHOS 95  BILITOT 0.9  PROT 9.0*  ALBUMIN 4.8   No results found for this basename: LIPASE, AMYLASE,  in the last 168 hours No results found for this basename: AMMONIA,  in the last 168 hours CBC:  Recent Labs Lab 09/02/14 2316  WBC 6.8  NEUTROABS 4.0  HGB 16.7  HCT 46.8  MCV 99.8  PLT 229   Cardiac Enzymes:  Recent Labs Lab 09/03/14 0219  TROPONINI <0.30    BNP (last 3 results) No results found for this basename: PROBNP,  in the last 8760 hours CBG: No results found for this basename: GLUCAP,  in the last 168  hours  Radiological Exams on Admission: Ct Head Wo Contrast  09/03/2014   CLINICAL DATA:  ETOH. Fall. History of stroke. Prostate cancer in 2010.  EXAM: CT HEAD WITHOUT CONTRAST  TECHNIQUE: Contiguous axial images were obtained from the base of the skull through the vertex without intravenous contrast.  COMPARISON:  07/04/2014  FINDINGS: There is mild central and cortical atrophy. Periventricular white matter changes are consistent with small vessel disease. There is no evidence for hemorrhage, mass lesion, or acute infarction. There is moderate bilateral maxillary sinus disease. Suspect caries. No calvarial fracture.  IMPRESSION: 1. Atrophy and small vessel disease. 2.  No evidence for acute intracranial abnormality. 3. Bilateral sinus disease. 4. Suspect caries.   Electronically Signed   By: Shon Hale M.D.   On: 09/03/2014 00:53    EKG: NSR, RAD, nonspecific  ST-T wave change V1- V3  Assessment/Plan Active Problems:   Syncope   Syncope and collapse   Scalp laceration   Alcohol abuse   Non-compliant behavior   Alcohol withdrawal   Alcohol abuse -Patient continues to drink significant amounts of alcohol as evidence by his admission alcohol level= 313. -Continue to hydrate patient aggressively and monitor for alcohol withdrawal. Has received 1 banana bag, continue normal saline at 166ml/hr -Patient needs SDU bed as he appears to be on the verge of significant alcohol withdrawal. -Continue  CIWA protocol -Counseled patient that in the event of significant symptoms he would need to be sedated and intubated in order to safely transition through alcohol intoxication. -B12, folate, methylmalonic acid, thiamine levels pending -Patient with asterixis; beginnings of Wernicke-Korsakoff Syndrome     Alcohol withdrawal -See alcohol abuse -Psychiatric consult after patient alcohol levels within normal limit -Recommend IVC  Transaminitis -Most likely secondary to alcohol abuse continue to  monitor  Syncope with collapse -Patient with poor understanding of his disease process believes his syncope and collapse secondary to Mnire's disease. -Obtain echocardiogram, -PT/OT after patient has cleared the alcohol from the system safely.  Depression -Will continue Remeron at current dose as abrupt cessation may cause suicidal thoughts, seizures etc.  Scalp laceration -Stable  Noncompliant behavior -Recommend IVC  Mnire's disease? -Unsure patient has received a diagnosis of this disease process. Would check with patient's family to determine if and when patient was seen by a neurologist who diagnosed with Mnire's.     Code Status: Full Family Communication: None Disposition Plan: Recommend IVC  Time spent: 90 min  WOODS, Geraldo Docker [facility] Pager [phone]  If 7PM-7AM, please contact night-coverage [email or web address] Password [email or web address] 09/03/2014, 3:13 AM

## 2014-09-03 NOTE — Progress Notes (Signed)
Patient seen and examined by my colleague Dr. Sherral Hammers earlier today. Patient seen and examined, and data base reviewed. Patient admitted to the hospital after a fall, likely secondary to alcohol intoxication (alcohol level is 313) Pending B12, folate, methylmalonic acid, thiamine levels. Treat with CIWA protocol for alcohol withdrawal.  Birdie Hopes Pager: [phone] 09/03/2014, 1:20 PM

## 2014-09-04 DIAGNOSIS — F1010 Alcohol abuse, uncomplicated: Secondary | ICD-10-CM

## 2014-09-04 DIAGNOSIS — R55 Syncope and collapse: Secondary | ICD-10-CM

## 2014-09-04 DIAGNOSIS — F10931 Alcohol use, unspecified with withdrawal delirium: Secondary | ICD-10-CM | POA: Diagnosis not present

## 2014-09-04 DIAGNOSIS — F10231 Alcohol dependence with withdrawal delirium: Secondary | ICD-10-CM | POA: Diagnosis not present

## 2014-09-04 DIAGNOSIS — I951 Orthostatic hypotension: Secondary | ICD-10-CM

## 2014-09-04 LAB — COMPREHENSIVE METABOLIC PANEL
ALK PHOS: 68 U/L (ref 39–117)
ALT: 96 U/L — ABNORMAL HIGH (ref 0–53)
ANION GAP: 14 (ref 5–15)
AST: 78 U/L — ABNORMAL HIGH (ref 0–37)
Albumin: 3.4 g/dL — ABNORMAL LOW (ref 3.5–5.2)
BILIRUBIN TOTAL: 1.1 mg/dL (ref 0.3–1.2)
BUN: 17 mg/dL (ref 6–23)
CHLORIDE: 102 meq/L (ref 96–112)
CO2: 20 meq/L (ref 19–32)
Calcium: 8.5 mg/dL (ref 8.4–10.5)
Creatinine, Ser: 0.82 mg/dL (ref 0.50–1.35)
GFR calc Af Amer: >90 mL/min (ref >90)
GLUCOSE: 98 mg/dL (ref 70–99)
Potassium: 3.7 mEq/L (ref 3.7–5.3)
Sodium: 136 mEq/L — ABNORMAL LOW (ref 137–147)
Total Protein: 6.6 g/dL (ref 6.0–8.3)

## 2014-09-04 LAB — CBC
HEMATOCRIT: 36.5 % — AB (ref 39.0–52.0)
Hemoglobin: 12.9 g/dL — ABNORMAL LOW (ref 13.0–17.0)
MCH: 35.6 pg — AB (ref 26.0–34.0)
MCHC: 35.3 g/dL (ref 30.0–36.0)
MCV: 100.8 fL — AB (ref 78.0–100.0)
PLATELETS: 186 10*3/uL (ref 150–400)
RBC: 3.62 MIL/uL — AB (ref 4.22–5.81)
RDW: 13.8 % (ref 11.5–15.5)
WBC: 6.7 10*3/uL (ref 4.0–10.5)

## 2014-09-04 LAB — TYPE AND SCREEN
ABO/RH(D): B NEG
Antibody Screen: NEGATIVE

## 2014-09-04 LAB — PROTIME-INR
INR: 1.09 (ref 0.00–1.49)
Prothrombin Time: 14.1 seconds (ref 11.6–15.2)

## 2014-09-04 LAB — MAGNESIUM: Magnesium: 1.8 mg/dL (ref 1.5–2.5)

## 2014-09-04 MED ORDER — FOLIC ACID 1 MG PO TABS
1.0000 mg | ORAL_TABLET | Freq: Every day | ORAL | Status: DC
  Administered 2014-09-06 – 2014-09-12 (×7): 1 mg via ORAL
  Filled 2014-09-04 (×7): qty 1

## 2014-09-04 MED ORDER — HALOPERIDOL LACTATE 5 MG/ML IJ SOLN
INTRAMUSCULAR | Status: AC
  Filled 2014-09-04: qty 1

## 2014-09-04 MED ORDER — HALOPERIDOL LACTATE 5 MG/ML IJ SOLN
5.0000 mg | Freq: Once | INTRAMUSCULAR | Status: AC
  Administered 2014-09-04: 5 mg via INTRAVENOUS

## 2014-09-04 NOTE — Consult Note (Signed)
Name: Joshua Henson MRN: [id number] DOB: May 13, 1946    ADMISSION DATE:  09/02/2014 CONSULTATION DATE:  9/17  REFERRING MD :  [facility] PRIMARY SERVICE:  [facility]  CHIEF COMPLAINT:  ETOH wd  BRIEF PATIENT DESCRIPTION: 68 yo WM NAD  SIGNIFICANT EVENTS / STUDIES:  9/16 admitted fior ETOH wd  LINES / TUBES:   CULTURES:   ANTIBIOTICS:   HISTORY OF PRESENT ILLNESS:  68 yo WM admitted 9/16 per [facility] with falls, reported long term ETOH use of > pint whiskey daily for years. He is very tremulous and is experiencing occasional clonic jerking of extremities,  although orientated  x 3, aware of his jerking legs and reports difficulty sleeping. He is receiving large doses of ativan but is in no respiratory distress.  PCCM asked to evaluate for precedex to augment current sedation.   PAST MEDICAL HISTORY :  Past Medical History  Diagnosis Date  . Prostate cancer 2010    External beam radiation (urol - Risa Grill, XRT Valere Dross)  . Stroke   . Depression    Past Surgical History  Procedure Laterality Date  . None     Prior to Admission medications   Medication Sig Start Date End Date Taking? Authorizing Provider  amLODipine (NORVASC) 10 MG tablet Take 1 tablet (10 mg total) by mouth daily. 07/11/14  Yes Daniel J Angiulli, PA-C  aspirin 81 MG EC tablet Take 1 tablet (81 mg total) by mouth daily. 07/11/14  Yes Daniel J Angiulli, PA-C  atorvastatin (LIPITOR) 10 MG tablet Take 1 tablet (10 mg total) by mouth daily. 07/11/14  Yes Daniel J Angiulli, PA-C  fluticasone (FLONASE) 50 MCG/ACT nasal spray Place 1 spray into both nostrils daily. 07/11/14  Yes Daniel J Angiulli, PA-C  folic acid (FOLVITE) 1 MG tablet Take 1 tablet (1 mg total) by mouth daily. 07/11/14  Yes Daniel J Angiulli, PA-C  loratadine (CLARITIN) 10 MG tablet Take 1 tablet (10 mg total) by mouth daily. 07/11/14  Yes Daniel J Angiulli, PA-C  LORazepam (ATIVAN) 1 MG tablet Take 1 tablet (1 mg total) by mouth 3 (three) times daily.  07/11/14  Yes Daniel J Angiulli, PA-C  mirtazapine (REMERON) 7.5 MG tablet Take 1 tablet (7.5 mg total) by mouth at bedtime. 07/11/14  Yes Daniel J Angiulli, PA-C  tamsulosin (FLOMAX) 0.4 MG CAPS capsule Take 1 capsule (0.4 mg total) by mouth daily. 07/11/14  Yes Daniel J Angiulli, PA-C  Vortioxetine HBr (BRINTELLIX) 10 MG TABS Take 10 mg by mouth daily.   Yes Historical Provider, MD   Allergies  Allergen Reactions  . Celebrex [Celecoxib] Rash    FAMILY HISTORY:  Family History  Problem Relation Age of Onset  . Cancer Mother     esophageal  . Hypertension Mother   . Diabetes Mother   . Cancer Father     prostate  . Heart disease Maternal Grandfather     MI  . Cancer Paternal Grandfather     prostate   SOCIAL HISTORY:  reports that he has been smoking Cigarettes.  He has a 30 pack-year smoking history. He has never used smokeless tobacco. He reports that he drinks about 3.5 ounces of alcohol per week. He reports that he does not use illicit drugs.  REVIEW OF SYSTEMS:   10 point review of system taken, please see HPI for positives and negatives.  SUBJECTIVE:  NAD but does feel tremulous  VITAL SIGNS: Temp:  [98 F (36.7 C)-99.4 F (37.4 C)] 99.4 F (37.4  C) (09/17 0800) Pulse Rate:  [77-104] 77 (09/17 0800) Resp:  [15-23] 20 (09/17 0800) BP: (116-168)/(61-119) 119/80 mmHg (09/17 0800) SpO2:  [90 %-98 %] 95 % (09/17 0800) Weight:  [208 lb 15.9 oz (94.8 kg)] 208 lb 15.9 oz (94.8 kg) (09/17 0400)  PHYSICAL EXAMINATION: General:  WNWDWM in NAD Neuro:  A&O x 3, tremulous , spastic clonic jerking of extremities ant irregular intervals.  HEENT: No JVD/LAN Cardiovascular: HSR RRR Lungs:  CTA Abdomen:  Soft +bs Musculoskeletal:  Intact Skin: Multiple abrasions , ecchymosis of face and extremities cw falls.    Recent Labs Lab 09/02/14 2316 09/03/14 0520 09/04/14 0400  NA 142 142 136*  K 3.8 3.5* 3.7  CL 98 99 102  CO2 23 21 20   BUN 15 15 17   CREATININE 0.87 0.83 0.82   GLUCOSE 111* 83 98    Recent Labs Lab 09/02/14 2316 09/03/14 0520 09/04/14 0400  HGB 16.7 14.6 12.9*  HCT 46.8 41.7 36.5*  WBC 6.8 6.0 6.7  PLT 229 202 186   Ct Head Wo Contrast  09/03/2014   CLINICAL DATA:  ETOH. Fall. History of stroke. Prostate cancer in 2010.  EXAM: CT HEAD WITHOUT CONTRAST  TECHNIQUE: Contiguous axial images were obtained from the base of the skull through the vertex without intravenous contrast.  COMPARISON:  07/04/2014  FINDINGS: There is mild central and cortical atrophy. Periventricular white matter changes are consistent with small vessel disease. There is no evidence for hemorrhage, mass lesion, or acute infarction. There is moderate bilateral maxillary sinus disease. Suspect caries. No calvarial fracture.  IMPRESSION: 1. Atrophy and small vessel disease. 2.  No evidence for acute intracranial abnormality. 3. Bilateral sinus disease. 4. Suspect caries.   Electronically Signed   By: Shon Hale M.D.   On: 09/03/2014 00:53    ASSESSMENT    Alcohol withdrawal  Syncope   Syncope and collapse   Scalp laceration   Alcohol abuse   Non-compliant behavior  68 yo WM admitted 9/16 per [facility] with falls, reported long term ETOH use of > pint whiskey daily for years. He is very tremulous and is experiencing clonic jerking of extremities. He is receiving large doses of ativan but is in no respiratory distress.   PLAN: Continue current sedation. He does not appear to need precedex at this time - he is not delirious, appears to be benefiting from ativan. May need to escalate the ativan or add precedex if he worsens.  PCCM will follow with you and institute precedex if needed.   Richardson Landry Minor ACNP Maryanna Shape PCCM Pager [phone] till 3 pm If no answer page [phone] 09/04/2014, 9:54 AM  45 minutes CC time  Baltazar Apo, MD, PhD 09/04/2014, 11:48 AM [facility] [phone] or if no answer [phone]

## 2014-09-04 NOTE — Plan of Care (Signed)
Problem: Phase I Progression Outcomes Goal: Voiding-avoid urinary catheter unless indicated Outcome: Completed/Met Date Met:  09/04/14 Has CONDOM CATHETER.

## 2014-09-04 NOTE — Progress Notes (Signed)
Patient is a high fall risk and is continuously noncompliant with safety plan.  Patient continues to attempt to get out of bed without assistance and without notifying either RN or Nurse Tech.  Patient educated on safety plan and fall risk prevention measures but shows no evidence of learning and needs further reinforcement.  Patient communicated desire to potentially leave hospital tomorrow.  Stated that he is not happy with the mattresses and does not want to spend another night here.  Patient educated on the risks of alcohol withdrawal without medical supervision.  MD notified of patient's concerns.  Will continue to monitor.

## 2014-09-04 NOTE — Progress Notes (Signed)
[facility] PROGRESS NOTE   Joshua Henson BZJ:[id number] DOB: [birth year] DOA: 09/02/2014 PCP: Adella Hare, MD  HPI/Subjective: Patient is awake, alert and oriented x3. Per nursing staff he was confused last night and he wants to go home.  Assessment/Plan: Active Problems:   Syncope   Syncope and collapse   Scalp laceration   Alcohol abuse   Non-compliant behavior   Alcohol withdrawal     Alcohol withdrawal  -Patient started on CIWA protocol, patient drinks a pint of vodka per day. -He was recently in the hospital for stroke in July of 2015, also went through detoxification. -Continue to monitor and stepdown. -Nursing staff concerned about patient safety as he is trying to get up, and they inquire about Precedex.  Alcohol abuse  -Patient continues to drink significant amounts of alcohol, alcohol level on admission was 313. -Continue multivitamins, folate and thiamine, banana bag daily. 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  Transaminitis  -Most likely secondary to alcohol abuse continue to monitor. -Screening for hepatitis C is negative  Syncope with collapse  -Patient with poor understanding of his disease process believes his syncope/collapse secondary to Mnire's disease.  -2-D echocardiogram from 07/02/2014 showed normal LVEF.  Depression  -Will continue Remeron at current dose as abrupt cessation may cause suicidal thoughts, seizures etc.   Scalp laceration  -Stable   Noncompliant behavior  -As patient going through withdrawal right now, probably is not aware about what he is doing. -Monitor patient, if he wants to leave AMA I will probably recommend the IVC.  Mnire's disease?  -Unsure patient has received a diagnosis of this disease process. Would check with patient's family to determine if and when patient was seen by a neurologist who diagnosed with Mnire's.   Code  Status: Full code Family Communication: Plan discussed with the patient. Disposition Plan: Remains inpatient   Consultants:  PCCM  Procedures:  None  Antibiotics:  None   Objective: Filed Vitals:   09/04/14 0800  BP: 119/80  Pulse: 77  Temp: 99.4 F (37.4 C)  Resp: 20    Intake/Output Summary (Last 24 hours) at 09/04/14 1000 Last data filed at 09/04/14 0800  Gross per 24 hour  Intake 2063.75 ml  Output   1625 ml  Net 438.75 ml   Filed Weights   09/03/14 0450 09/04/14 0400  Weight: 92.4 kg (203 lb 11.3 oz) 94.8 kg (208 lb 15.9 oz)    Exam: General: Alert and awake, oriented x3, not in any acute distress. HEENT: anicteric sclera, pupils reactive to light and accommodation, EOMI CVS: S1-S2 clear, no murmur rubs or gallops Chest: clear to auscultation bilaterally, no wheezing, rales or rhonchi Abdomen: soft nontender, nondistended, normal bowel sounds, no organomegaly Extremities: no cyanosis, clubbing or edema noted bilaterally Neuro: Cranial nerves II-XII intact, no focal neurological deficits  Data Reviewed: Basic Metabolic Panel:  Recent Labs Lab 09/02/14 2316 09/03/14 0219 09/03/14 0520 09/04/14 0400  NA 142  --  142 136*  K 3.8  --  3.5* 3.7  CL 98  --  99 102  CO2 23  --  21 20  GLUCOSE 111*  --  83 98  BUN 15  --  15 17  CREATININE 0.87  --  0.83 0.82  CALCIUM 10.0  --  9.6 8.5  MG 2.3  --  1.9 1.8  PHOS 3.2 2.8  --   --    Liver Function Tests:  Recent Labs Lab 09/02/14 2316  09/03/14 0520 09/04/14 0400  AST 192* 147* 78*  ALT 202* 154* 96*  ALKPHOS 95 79 68  BILITOT 0.9 0.8 1.1  PROT 9.0* 7.5 6.6  ALBUMIN 4.8 4.0 3.4*   No results found for this basename: LIPASE, AMYLASE,  in the last 168 hours No results found for this basename: AMMONIA,  in the last 168 hours CBC:  Recent Labs Lab 09/02/14 2316 09/03/14 0520 09/04/14 0400  WBC 6.8 6.0 6.7  NEUTROABS 4.0  --   --   HGB 16.7 14.6 12.9*  HCT 46.8 41.7 36.5*  MCV 99.8  100.0 100.8*  PLT 229 202 186   Cardiac Enzymes:  Recent Labs Lab 09/03/14 0219 09/03/14 0520 09/03/14 1327  TROPONINI <0.30 <0.30 <0.30   BNP (last 3 results)  Recent Labs  09/03/14 0520  PROBNP 12.6   CBG: No results found for this basename: GLUCAP,  in the last 168 hours  Micro Recent Results (from the past 240 hour(s))  MRSA PCR SCREENING     Status: None   Collection Time    09/03/14  4:52 AM      Result Value Ref Range Status   MRSA by PCR NEGATIVE  NEGATIVE Final   Comment:            The GeneXpert MRSA Assay (FDA     approved for NASAL specimens     only), is one component of a     comprehensive MRSA colonization     surveillance program. It is not     intended to diagnose MRSA     infection nor to guide or     monitor treatment for     MRSA infections.     Studies: Ct Head Wo Contrast  09/03/2014   CLINICAL DATA:  ETOH. Fall. History of stroke. Prostate cancer in 2010.  EXAM: CT HEAD WITHOUT CONTRAST  TECHNIQUE: Contiguous axial images were obtained from the base of the skull through the vertex without intravenous contrast.  COMPARISON:  07/04/2014  FINDINGS: There is mild central and cortical atrophy. Periventricular white matter changes are consistent with small vessel disease. There is no evidence for hemorrhage, mass lesion, or acute infarction. There is moderate bilateral maxillary sinus disease. Suspect caries. No calvarial fracture.  IMPRESSION: 1. Atrophy and small vessel disease. 2.  No evidence for acute intracranial abnormality. 3. Bilateral sinus disease. 4. Suspect caries.   Electronically Signed   By: Shon Hale M.D.   On: 09/03/2014 00:53    Scheduled Meds: . atorvastatin  10 mg Oral Daily  . fluticasone  1 spray Each Nare Daily  . folic acid  1 mg Intravenous Daily  . Influenza vac split quadrivalent PF  0.5 mL Intramuscular Tomorrow-1000  . loratadine  10 mg Oral Daily  . LORazepam  0-4 mg Intravenous Q6H   Followed by  . [START ON  09/05/2014] LORazepam  0-4 mg Intravenous Q12H  . LORazepam  2 mg Oral 6 times per day   Or  . LORazepam  2 mg Intravenous 6 times per day  . mirtazapine  7.5 mg Oral QHS  . multivitamin with minerals  1 tablet Oral Daily  . thiamine  100 mg Oral Daily   Continuous Infusions: . sodium chloride 75 mL/hr at 09/03/14 1325       Time spent: 35 minutes    [facility]  [facility] Pager [phone] If 7PM-7AM, please contact night-coverage at [email or web address], password [facility] 09/04/2014, 10:00 AM  LOS: 2 days

## 2014-09-04 NOTE — Progress Notes (Signed)
This patient is receiving folic acid. Based on criteria approved by the Pharmacy and Therapeutics Committee, this medication is being converted to the equivalent oral dose form. These criteria include:   . The patient is eating (either orally or per tube) and/or has been taking other orally administered medications for at least 24 hours.  . This patient has no evidence of active gastrointestinal bleeding or impaired GI absorption (gastrectomy, short bowel, patient on TNA or NPO).   If you have questions about this conversion, please contact the pharmacy department.  Hershal Coria, [facility] 09/04/2014 11:24 AM

## 2014-09-05 ENCOUNTER — Inpatient Hospital Stay (HOSPITAL_COMMUNITY): Payer: Medicare Other

## 2014-09-05 DIAGNOSIS — R5383 Other fatigue: Secondary | ICD-10-CM | POA: Diagnosis present

## 2014-09-05 DIAGNOSIS — J9600 Acute respiratory failure, unspecified whether with hypoxia or hypercapnia: Secondary | ICD-10-CM | POA: Diagnosis present

## 2014-09-05 DIAGNOSIS — J690 Pneumonitis due to inhalation of food and vomit: Secondary | ICD-10-CM | POA: Diagnosis present

## 2014-09-05 LAB — COMPREHENSIVE METABOLIC PANEL
ALT: 78 U/L — ABNORMAL HIGH (ref 0–53)
AST: 68 U/L — ABNORMAL HIGH (ref 0–37)
Albumin: 3.7 g/dL (ref 3.5–5.2)
Alkaline Phosphatase: 73 U/L (ref 39–117)
Anion gap: 17 — ABNORMAL HIGH (ref 5–15)
BUN: 7 mg/dL (ref 6–23)
CALCIUM: 9 mg/dL (ref 8.4–10.5)
CO2: 19 mEq/L (ref 19–32)
Chloride: 101 mEq/L (ref 96–112)
Creatinine, Ser: 0.68 mg/dL (ref 0.50–1.35)
GFR calc non Af Amer: >90 mL/min (ref >90)
GLUCOSE: 122 mg/dL — AB (ref 70–99)
Potassium: 3.6 mEq/L — ABNORMAL LOW (ref 3.7–5.3)
Sodium: 137 mEq/L (ref 137–147)
Total Bilirubin: 1.3 mg/dL — ABNORMAL HIGH (ref 0.3–1.2)
Total Protein: 7.3 g/dL (ref 6.0–8.3)

## 2014-09-05 LAB — BLOOD GAS, ARTERIAL
Acid-base deficit: 3 mmol/L — ABNORMAL HIGH (ref 0.0–2.0)
Bicarbonate: 19.1 mEq/L — ABNORMAL LOW (ref 20.0–24.0)
Drawn by: 276051
O2 Content: 4 L/min
O2 SAT: 95 %
PATIENT TEMPERATURE: 100.6
PO2 ART: 76.8 mmHg — AB (ref 80.0–100.0)
TCO2: 16.7 mmol/L (ref 0–100)
pCO2 arterial: 29 mmHg — ABNORMAL LOW (ref 35.0–45.0)
pH, Arterial: 7.441 (ref 7.350–7.450)

## 2014-09-05 LAB — CBC
HCT: 40.5 % (ref 39.0–52.0)
Hemoglobin: 14.8 g/dL (ref 13.0–17.0)
MCH: 36.1 pg — ABNORMAL HIGH (ref 26.0–34.0)
MCHC: 36.5 g/dL — AB (ref 30.0–36.0)
MCV: 98.8 fL (ref 78.0–100.0)
Platelets: 198 10*3/uL (ref 150–400)
RBC: 4.1 MIL/uL — ABNORMAL LOW (ref 4.22–5.81)
RDW: 13.7 % (ref 11.5–15.5)
WBC: 9.2 10*3/uL (ref 4.0–10.5)

## 2014-09-05 LAB — METHYLMALONIC ACID, SERUM: Methylmalonic Acid, Quantitative: 52 nmol/L — ABNORMAL LOW (ref 87–318)

## 2014-09-05 LAB — PROTIME-INR
INR: 1.07 (ref 0.00–1.49)
Prothrombin Time: 13.9 seconds (ref 11.6–15.2)

## 2014-09-05 LAB — MAGNESIUM: Magnesium: 1.2 mg/dL — ABNORMAL LOW (ref 1.5–2.5)

## 2014-09-05 MED ORDER — HALOPERIDOL LACTATE 5 MG/ML IJ SOLN
5.0000 mg | Freq: Once | INTRAMUSCULAR | Status: AC
  Administered 2014-09-05: 5 mg via INTRAVENOUS
  Filled 2014-09-05: qty 1

## 2014-09-05 MED ORDER — THIAMINE HCL 100 MG/ML IJ SOLN
Freq: Once | INTRAVENOUS | Status: AC
  Administered 2014-09-05: 11:00:00 via INTRAVENOUS
  Filled 2014-09-05: qty 1000

## 2014-09-05 MED ORDER — POTASSIUM CHLORIDE 10 MEQ/100ML IV SOLN
10.0000 meq | INTRAVENOUS | Status: AC
  Administered 2014-09-05 (×3): 10 meq via INTRAVENOUS
  Filled 2014-09-05 (×3): qty 100

## 2014-09-05 MED ORDER — SODIUM CHLORIDE 0.9 % IV SOLN
3.0000 g | Freq: Four times a day (QID) | INTRAVENOUS | Status: DC
  Administered 2014-09-05 – 2014-09-07 (×9): 3 g via INTRAVENOUS
  Filled 2014-09-05 (×10): qty 3

## 2014-09-05 MED ORDER — DEXMEDETOMIDINE HCL IN NACL 200 MCG/50ML IV SOLN
0.4000 ug/kg/h | INTRAVENOUS | Status: DC
  Administered 2014-09-05: 0.4 ug/kg/h via INTRAVENOUS
  Administered 2014-09-05: 0.9 ug/kg/h via INTRAVENOUS
  Administered 2014-09-05: 0.8 ug/kg/h via INTRAVENOUS
  Filled 2014-09-05 (×3): qty 50

## 2014-09-05 NOTE — Progress Notes (Signed)
ANTIBIOTIC CONSULT NOTE - INITIAL  Pharmacy Consult for Unasyn Indication: aspiration pneumonia  Allergies  Allergen Reactions  . Celebrex [Celecoxib] Rash    Patient Measurements: Height: 5\' 10"  (177.8 cm) Weight: 214 lb 8.1 oz (97.3 kg) IBW/kg (Calculated) : 73  Vital Signs: Temp: 100.6 F (38.1 C) (09/18 1000) Temp src: Oral (09/18 1000) BP: 116/54 mmHg (09/18 1011) Pulse Rate: 79 (09/18 1010) Intake/Output from previous day: 09/17 0701 - 09/18 0700 In: 2160 [P.O.:660; I.V.:1500] Out: 3150 [Urine:3150] Intake/Output from this shift: Total I/O In: -  Out: 230 [Urine:230]  Labs:  Recent Labs  09/03/14 0520 09/04/14 0400 09/05/14 0335  WBC 6.0 6.7 9.2  HGB 14.6 12.9* 14.8  PLT 202 186 198  CREATININE 0.83 0.82 0.68   Estimated Creatinine Clearance: 106.2 ml/min (by C-G formula based on Cr of 0.68). No results found for this basename: VANCOTROUGH, Corlis Leak, VANCORANDOM, Formoso, GENTPEAK, GENTRANDOM, TOBRATROUGH, TOBRAPEAK, TOBRARND, AMIKACINPEAK, AMIKACINTROU, AMIKACIN,  in the last 72 hours   Microbiology: Recent Results (from the past 720 hour(s))  MRSA PCR SCREENING     Status: None   Collection Time    09/03/14  4:52 AM      Result Value Ref Range Status   MRSA by PCR NEGATIVE  NEGATIVE Final   Comment:            The GeneXpert MRSA Assay (FDA     approved for NASAL specimens     only), is one component of a     comprehensive MRSA colonization     surveillance program. It is not     intended to diagnose MRSA     infection nor to guide or     monitor treatment for     MRSA infections.    Medical History: Past Medical History  Diagnosis Date  . Prostate cancer 2010    External beam radiation (urol - Risa Grill, XRT Valere Dross)  . Stroke   . Depression     Medications:  Scheduled:  . atorvastatin  10 mg Oral Daily  . fluticasone  1 spray Each Nare Daily  . folic acid  1 mg Oral Daily  . loratadine  10 mg Oral Daily  . LORazepam  0-4 mg  Intravenous Q6H   Followed by  . LORazepam  0-4 mg Intravenous Q12H  . LORazepam  2 mg Oral 6 times per day   Or  . LORazepam  2 mg Intravenous 6 times per day  . mirtazapine  7.5 mg Oral QHS  . multivitamin with minerals  1 tablet Oral Daily  . potassium chloride  10 mEq Intravenous Q1 Hr x 3  . banana bag IV 1000 mL   Intravenous Once  . thiamine  100 mg Oral Daily   Infusions:  . sodium chloride 75 mL/hr at 09/05/14 0500   Assessment: 68 yo male admitted 9/16 with falls, reported 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 Patient started on Ativan protocol for alcohol withdrawal but then experienced worsened agitation and possible DTs last PM and was started on Precedex drip and CIWA protocol continued. Patient became lethargic AM 9/18 likely oversedated and experiencing acute resp failure found to have aspiration PNA per CXR to start Unasyn per pharmacy dosing  9/18 >> Unasyn >>   Tmax 100.6  WBC stable  SCr stable, CrCl 106  Goal of Therapy:  treatment of aspiration PNA  Plan:  1) Unasyn 3g IV q6 for CrCl > 30 2) Will  follow cultures, labs  Adrian Saran, PharmD, BCPS Pager [phone] 09/05/2014 11:11 AM

## 2014-09-05 NOTE — Progress Notes (Signed)
[facility] PROGRESS NOTE   Joshua Henson ASN:[id number] DOB: 01-09-1946 DOA: 09/02/2014 PCP: Adella Hare, MD  HPI/Subjective: Sedated with Precedex. PCCM consulted.  Assessment/Plan: Active Problems:   Syncope   Syncope and collapse   Scalp laceration   Alcohol abuse   Non-compliant behavior   Alcohol withdrawal     Alcohol withdrawal  -Patient started on CIWA protocol, patient drinks a pint of vodka per day. -He was recently in the hospital for stroke in July of 2015, also went through detoxification. -Continue to monitor and stepdown. -Patient started on Precedex earlier today, he is sedated.  Alcohol abuse  -Patient continues to drink significant amounts of alcohol, alcohol level on admission was 313. -Continue multivitamins, folate and thiamine, banana bag daily. 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  Transaminitis  -Most likely secondary to alcohol abuse continue to monitor. -Screening for hepatitis C is negative  Syncope with collapse  -Patient with poor understanding of his disease process believes his syncope/collapse secondary to Mnire's disease.  -2-D echocardiogram from 07/02/2014 showed normal LVEF.  Depression  -Will continue Remeron at current dose as abrupt cessation may cause suicidal thoughts, seizures etc.   Scalp laceration  -Stable   Noncompliant behavior  -As patient going through withdrawal right now, probably is not aware about what he is doing. -Monitor patient, if he wants to leave AMA I will probably recommend the IVC.  Mnire's disease?  -Unsure patient has received a diagnosis of this disease process. Would check with patient's family to determine if and when patient was seen by a neurologist who diagnosed with Mnire's.   Code Status: Full code Family Communication: Plan discussed with the patient. Disposition Plan: Remains  inpatient   Consultants:  PCCM  Procedures:  None  Antibiotics:  None   Objective: Filed Vitals:   09/05/14 1000  BP:   Pulse: 82  Temp:   Resp: 31    Intake/Output Summary (Last 24 hours) at 09/05/14 1017 Last data filed at 09/05/14 0900  Gross per 24 hour  Intake   1635 ml  Output   3080 ml  Net  -1445 ml   Filed Weights   09/03/14 0450 09/04/14 0400 09/05/14 0013  Weight: 92.4 kg (203 lb 11.3 oz) 94.8 kg (208 lb 15.9 oz) 97.3 kg (214 lb 8.1 oz)    Exam: General: Alert and awake, oriented x3, not in any acute distress. HEENT: anicteric sclera, pupils reactive to light and accommodation, EOMI CVS: S1-S2 clear, no murmur rubs or gallops Chest: clear to auscultation bilaterally, no wheezing, rales or rhonchi Abdomen: soft nontender, nondistended, normal bowel sounds, no organomegaly Extremities: no cyanosis, clubbing or edema noted bilaterally Neuro: Cranial nerves II-XII intact, no focal neurological deficits  Data Reviewed: Basic Metabolic Panel:  Recent Labs Lab 09/02/14 2316 09/03/14 0219 09/03/14 0520 09/04/14 0400 09/05/14 0335  NA 142  --  142 136* 137  K 3.8  --  3.5* 3.7 3.6*  CL 98  --  99 102 101  CO2 23  --  21 20 19   GLUCOSE 111*  --  83 98 122*  BUN 15  --  15 17 7   CREATININE 0.87  --  0.83 0.82 0.68  CALCIUM 10.0  --  9.6 8.5 9.0  MG 2.3  --  1.9 1.8 1.2*  PHOS 3.2 2.8  --   --   --    Liver Function Tests:  Recent Labs Lab 09/02/14 2316 09/03/14 0520 09/04/14  0400 09/05/14 0335  AST 192* 147* 78* 68*  ALT 202* 154* 96* 78*  ALKPHOS 95 79 68 73  BILITOT 0.9 0.8 1.1 1.3*  PROT 9.0* 7.5 6.6 7.3  ALBUMIN 4.8 4.0 3.4* 3.7   No results found for this basename: LIPASE, AMYLASE,  in the last 168 hours No results found for this basename: AMMONIA,  in the last 168 hours CBC:  Recent Labs Lab 09/02/14 2316 09/03/14 0520 09/04/14 0400 09/05/14 0335  WBC 6.8 6.0 6.7 9.2  NEUTROABS 4.0  --   --   --   HGB 16.7 14.6 12.9*  14.8  HCT 46.8 41.7 36.5* 40.5  MCV 99.8 100.0 100.8* 98.8  PLT 229 202 186 198   Cardiac Enzymes:  Recent Labs Lab 09/03/14 0219 09/03/14 0520 09/03/14 1327  TROPONINI <0.30 <0.30 <0.30   BNP (last 3 results)  Recent Labs  09/03/14 0520  PROBNP 12.6   CBG: No results found for this basename: GLUCAP,  in the last 168 hours  Micro Recent Results (from the past 240 hour(s))  MRSA PCR SCREENING     Status: None   Collection Time    09/03/14  4:52 AM      Result Value Ref Range Status   MRSA by PCR NEGATIVE  NEGATIVE Final   Comment:            The GeneXpert MRSA Assay (FDA     approved for NASAL specimens     only), is one component of a     comprehensive MRSA colonization     surveillance program. It is not     intended to diagnose MRSA     infection nor to guide or     monitor treatment for     MRSA infections.     Studies: Dg Chest Port 1 View  09/05/2014   CLINICAL DATA:  Fever and tachypnea  EXAM: PORTABLE CHEST - 1 VIEW  COMPARISON:  07/01/2014  .  FINDINGS: Right lower lobe infiltrate may represent atelectasis or pneumonia. Left lung clear.  Cardiac enlargement without heart failure or effusion  IMPRESSION: Right lower lobe infiltrate, possible pneumonia.   Electronically Signed   By: Franchot Gallo M.D.   On: 09/05/2014 10:02    Scheduled Meds: . atorvastatin  10 mg Oral Daily  . fluticasone  1 spray Each Nare Daily  . folic acid  1 mg Oral Daily  . loratadine  10 mg Oral Daily  . LORazepam  0-4 mg Intravenous Q6H   Followed by  . LORazepam  0-4 mg Intravenous Q12H  . LORazepam  2 mg Oral 6 times per day   Or  . LORazepam  2 mg Intravenous 6 times per day  . mirtazapine  7.5 mg Oral QHS  . multivitamin with minerals  1 tablet Oral Daily  . thiamine  100 mg Oral Daily   Continuous Infusions: . sodium chloride 75 mL/hr at 09/05/14 0500  . dexmedetomidine Stopped (09/05/14 0900)       Time spent: 35 minutes    [facility]  [facility] Pager [phone] If 7PM-7AM, please contact night-coverage at [email or web address], password [facility] 09/05/2014, 10:17 AM  LOS: 3 days

## 2014-09-05 NOTE — Progress Notes (Signed)
[place] Progress Note Patient Name: Joshua Henson DOB: 1946/11/12 MRN: [id number]   Date of Service  09/05/2014  HPI/Events of Note  Patient with worsening agitation and possible DTs  eICU Interventions  Start precedex gtt, cont with CIWA protocol     Intervention Category Intermediate Interventions: Other:  Abdullah Rizzi 09/05/2014, 4:03 AM

## 2014-09-05 NOTE — Progress Notes (Signed)
Name: Joshua Henson MRN: [id number] DOB: November 28, 1946    ADMISSION DATE:  09/02/2014 CONSULTATION DATE:  9/17  REFERRING MD :  [facility] PRIMARY SERVICE:  [facility]  CHIEF COMPLAINT:  ETOH wd  BRIEF PATIENT DESCRIPTION: 68 yo WM NAD  SIGNIFICANT EVENTS / STUDIES:  9/16 admitted fior ETOH wd 9/18 resp distress, ? Increasing agitation, may have been oversedated   HISTORY OF PRESENT ILLNESS:  68 yo WM admitted 9/16 per [facility] service with falls, reported long term ETOH use of > pint whiskey daily for years. He is very tremulous and is experiencing occasional clonic jerking of extremities,  although orientated  x 3, aware of his jerking legs and reports difficulty sleeping. He is receiving large doses of ativan but is in no respiratory distress.  PCCM asked to evaluate for precedex to augment current sedation.   SUBJECTIVE:  Oversedated bur arousable with noxious stimuli > on precedex since pm 9/17  VITAL SIGNS: Temp:  [99 F (37.2 C)-101.4 F (38.6 C)] 100.6 F (38.1 C) (09/18 1000) Pulse Rate:  [79-134] 79 (09/18 1010) Resp:  [18-49] 22 (09/18 1011) BP: (116-176)/(54-107) 116/54 mmHg (09/18 1011) SpO2:  [92 %-99 %] 94 % (09/18 1010) Weight:  [214 lb 8.1 oz (97.3 kg)] 214 lb 8.1 oz (97.3 kg) (09/18 0013)  PHYSICAL EXAMINATION: General:  WNWDWM sedated Neuro:  Sedated but arousable. No acute resp distress HEENT: No JVD/LAN Cardiovascular: HSR RRR Lungs:  Rhonchus, Purulent  sputum noted Abdomen:  Soft +bs Musculoskeletal:  Intact Skin: Multiple abrasions , ecchymosis of face and extremities cw falls.    Recent Labs Lab 09/03/14 0520 09/04/14 0400 09/05/14 0335  NA 142 136* 137  K 3.5* 3.7 3.6*  CL 99 102 101  CO2 21 20 19   BUN 15 17 7   CREATININE 0.83 0.82 0.68  GLUCOSE 83 98 122*    Recent Labs Lab 09/03/14 0520 09/04/14 0400 09/05/14 0335  HGB 14.6 12.9* 14.8  HCT 41.7 36.5* 40.5  WBC 6.0 6.7 9.2  PLT 202 186 198   Dg Chest Port 1 View  09/05/2014    CLINICAL DATA:  Fever and tachypnea  EXAM: PORTABLE CHEST - 1 VIEW  COMPARISON:  07/01/2014  .  FINDINGS: Right lower lobe infiltrate may represent atelectasis or pneumonia. Left lung clear.  Cardiac enlargement without heart failure or effusion  IMPRESSION: Right lower lobe infiltrate, possible pneumonia.   Electronically Signed   By: Franchot Gallo M.D.   On: 09/05/2014 10:02    ASSESSMENT  Active Problems:    Acute respiratory failure    Aspiration pneumonia    Alcohol withdrawal     Lethargy, complicated from oversedation    Syncope   Syncope and collapse   Scalp laceration   Alcohol abuse   Non-compliant behavior   Gout  68 yo WM admitted 9/16 per [facility] with falls, reported long term ETOH use of > pint whiskey daily for years. He is very tremulous and is experiencing clonic jerking of extremities. He is receiving large doses of ativan but was in no respiratory distress as of 9/17.  During the night of 9/18 developed tachypnea , agitation and was started on Precedex. He became lethargic and in am of 9/18 was stopped. ABG well compensated. CxR was indicative of rt lower opacity consistent with aspiration pna.  PLAN: Stop precedex, avoid oversedation. Would NOT use precedex unless he is delirious or agitated. This is a POOR choice to control tremor or expected withdrawal discomfort.  Ciwa  protocol for control of his withdrawal  NIMVS prn if he evolves tachypnea, progression of his apparent PNA Check sputum culture 918 Unasyn added (aspiration PNA) 9/18 >>   PCCM will remain a consult at this time   [facility] Minor ACNP Maryanna Shape PCCM Pager [phone] till 3 pm If no answer page [phone] 09/05/2014, 10:39 AM  Baltazar Apo, MD, PhD 09/05/2014, 11:14 AM [facility] [phone] or if no answer [phone]

## 2014-09-06 ENCOUNTER — Inpatient Hospital Stay (HOSPITAL_COMMUNITY): Payer: Medicare Other

## 2014-09-06 DIAGNOSIS — J9600 Acute respiratory failure, unspecified whether with hypoxia or hypercapnia: Secondary | ICD-10-CM

## 2014-09-06 LAB — BASIC METABOLIC PANEL
Anion gap: 15 (ref 5–15)
BUN: 10 mg/dL (ref 6–23)
CO2: 18 meq/L — AB (ref 19–32)
Calcium: 8 mg/dL — ABNORMAL LOW (ref 8.4–10.5)
Chloride: 106 mEq/L (ref 96–112)
Creatinine, Ser: 0.73 mg/dL (ref 0.50–1.35)
GFR calc Af Amer: >90 mL/min (ref >90)
GLUCOSE: 129 mg/dL — AB (ref 70–99)
POTASSIUM: 3.1 meq/L — AB (ref 3.7–5.3)
SODIUM: 139 meq/L (ref 137–147)

## 2014-09-06 LAB — CBC
HCT: 36.1 % — ABNORMAL LOW (ref 39.0–52.0)
HEMOGLOBIN: 12.7 g/dL — AB (ref 13.0–17.0)
MCH: 35.3 pg — ABNORMAL HIGH (ref 26.0–34.0)
MCHC: 35.2 g/dL (ref 30.0–36.0)
MCV: 100.3 fL — ABNORMAL HIGH (ref 78.0–100.0)
Platelets: 182 10*3/uL (ref 150–400)
RBC: 3.6 MIL/uL — AB (ref 4.22–5.81)
RDW: 13.8 % (ref 11.5–15.5)
WBC: 9.6 10*3/uL (ref 4.0–10.5)

## 2014-09-06 LAB — PROTIME-INR
INR: 1.15 (ref 0.00–1.49)
PROTHROMBIN TIME: 14.7 s (ref 11.6–15.2)

## 2014-09-06 LAB — MAGNESIUM: Magnesium: 1.4 mg/dL — ABNORMAL LOW (ref 1.5–2.5)

## 2014-09-06 LAB — PHOSPHORUS: PHOSPHORUS: 1.4 mg/dL — AB (ref 2.3–4.6)

## 2014-09-06 MED ORDER — POTASSIUM CHLORIDE CRYS ER 20 MEQ PO TBCR
40.0000 meq | EXTENDED_RELEASE_TABLET | Freq: Once | ORAL | Status: AC
  Administered 2014-09-06: 40 meq via ORAL
  Filled 2014-09-06: qty 2

## 2014-09-06 MED ORDER — KCL IN DEXTROSE-NACL 20-5-0.9 MEQ/L-%-% IV SOLN
INTRAVENOUS | Status: DC
  Administered 2014-09-06: 21:00:00 via INTRAVENOUS
  Administered 2014-09-06 – 2014-09-07 (×2): 1000 mL via INTRAVENOUS
  Administered 2014-09-07: 22:00:00 via INTRAVENOUS
  Filled 2014-09-06 (×4): qty 1000

## 2014-09-06 MED ORDER — MAGNESIUM SULFATE 40 MG/ML IJ SOLN
2.0000 g | Freq: Once | INTRAMUSCULAR | Status: AC
  Administered 2014-09-06: 2 g via INTRAVENOUS
  Filled 2014-09-06: qty 50

## 2014-09-06 MED ORDER — K PHOS MONO-SOD PHOS DI & MONO 155-852-130 MG PO TABS
500.0000 mg | ORAL_TABLET | Freq: Two times a day (BID) | ORAL | Status: DC
  Administered 2014-09-06 – 2014-09-09 (×7): 500 mg via ORAL
  Filled 2014-09-06 (×9): qty 2

## 2014-09-06 MED ORDER — LORAZEPAM 2 MG/ML IJ SOLN
1.0000 mg | Freq: Once | INTRAMUSCULAR | Status: AC
  Administered 2014-09-07: 1 mg via INTRAVENOUS
  Filled 2014-09-06: qty 1

## 2014-09-06 NOTE — Progress Notes (Addendum)
[facility] PROGRESS NOTE   Joshua Henson BOF:[id number] DOB: 08/04/1946 DOA: 09/02/2014 PCP: Adella Hare, MD  HPI/Subjective: Awake, alert and oriented x3. Complains about multiple aches and pains in different joints including right elbow and base of great toes. Fever of 101.4 yesterday started on antibiotics for aspiration  Assessment/Plan: Active Problems:   Syncope   Syncope and collapse   Scalp laceration   Alcohol abuse   Non-compliant behavior   Alcohol withdrawal   Acute respiratory failure   Aspiration pneumonia   Lethargy     Alcohol withdrawal  -Patient started on CIWA protocol, patient drinks a pint of vodka per day. -He was recently in the hospital for stroke in July of 2015, also went through detoxification. -Continue to monitor and stepdown. -Placed on Precedex for brief period of time, off of it, continue CIWA protocol. -More awake, alert and conversant. Planes about right elbow pain.  Pneumonia, likely aspiration -Patient developed fever of 101.4 yesterday, and tachypnea. -Chest x-ray showed right lower lobe infiltrates. -Started on Unasyn, add mucolytics, antitussives and oxygen as needed.  Alcohol abuse  -Patient continues to drink significant amounts of alcohol, alcohol level on admission was 313. -Continue multivitamins, folate and thiamine, banana bag daily. 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  Transaminitis  -Most likely secondary to alcohol abuse continue to monitor. -Screening for hepatitis C is negative  Syncope with collapse  -Patient with poor understanding of his disease process believes his syncope/collapse secondary to Mnire's disease.  -2-D echocardiogram from 07/02/2014 showed normal LVEF.  Depression  -Will continue Remeron at current dose as abrupt cessation may cause suicidal thoughts, seizures etc.   Scalp laceration  -Stable   Noncompliant  behavior  -As patient going through withdrawal right now, probably is not aware about what he is doing. -Monitor patient, if he wants to leave AMA I will probably recommend the IVC.  Mnire's disease?  -Unsure patient has received a diagnosis of this disease process. Would check with patient's family to determine if and when patient was seen by a neurologist who diagnosed with Mnire's.  Right elbow pain  -Patient also has bilateral great toe base pain, denies any history of gout. -The pain and the warmth of the joints suspicious for gout, check uric acid start patient on colchicine.  Hypokalemia -Hypokalemia with mild acidosis, along with hypomagnesemia and low phosphorus. -This is likely secondary to alcohol and Kaliuresis, patient also getting IV fluids without potassium.  Code Status: Full code Family Communication: Plan discussed with the patient. Disposition Plan: Remains inpatient   Consultants:  PCCM  Procedures:  None  Antibiotics:  None   Objective: Filed Vitals:   09/06/14 0856  BP: 157/99  Pulse: 117  Temp:   Resp:     Intake/Output Summary (Last 24 hours) at 09/06/14 1020 Last data filed at 09/06/14 0900  Gross per 24 hour  Intake   2985 ml  Output    725 ml  Net   2260 ml   Filed Weights   09/03/14 0450 09/04/14 0400 09/05/14 0013  Weight: 92.4 kg (203 lb 11.3 oz) 94.8 kg (208 lb 15.9 oz) 97.3 kg (214 lb 8.1 oz)    Exam: General: Alert and awake, oriented x3, not in any acute distress. HEENT: anicteric sclera, pupils reactive to light and accommodation, EOMI CVS: S1-S2 clear, no murmur rubs or gallops Chest: clear to auscultation bilaterally, no wheezing, rales or rhonchi Abdomen: soft nontender, nondistended, normal bowel sounds, no  organomegaly Extremities: no cyanosis, clubbing or edema noted bilaterally Neuro: Cranial nerves II-XII intact, no focal neurological deficits  Data Reviewed: Basic Metabolic Panel:  Recent Labs Lab  09/02/14 2316 09/03/14 0219 09/03/14 0520 09/04/14 0400 09/05/14 0335 09/06/14 0345  NA 142  --  142 136* 137 139  K 3.8  --  3.5* 3.7 3.6* 3.1*  CL 98  --  99 102 101 106  CO2 23  --  21 20 19  18*  GLUCOSE 111*  --  83 98 122* 129*  BUN 15  --  15 17 7 10   CREATININE 0.87  --  0.83 0.82 0.68 0.73  CALCIUM 10.0  --  9.6 8.5 9.0 8.0*  MG 2.3  --  1.9 1.8 1.2* 1.4*  PHOS 3.2 2.8  --   --   --  1.4*   Liver Function Tests:  Recent Labs Lab 09/02/14 2316 09/03/14 0520 09/04/14 0400 09/05/14 0335  AST 192* 147* 78* 68*  ALT 202* 154* 96* 78*  ALKPHOS 95 79 68 73  BILITOT 0.9 0.8 1.1 1.3*  PROT 9.0* 7.5 6.6 7.3  ALBUMIN 4.8 4.0 3.4* 3.7   No results found for this basename: LIPASE, AMYLASE,  in the last 168 hours No results found for this basename: AMMONIA,  in the last 168 hours CBC:  Recent Labs Lab 09/02/14 2316 09/03/14 0520 09/04/14 0400 09/05/14 0335 09/06/14 0345  WBC 6.8 6.0 6.7 9.2 9.6  NEUTROABS 4.0  --   --   --   --   HGB 16.7 14.6 12.9* 14.8 12.7*  HCT 46.8 41.7 36.5* 40.5 36.1*  MCV 99.8 100.0 100.8* 98.8 100.3*  PLT 229 202 186 198 182   Cardiac Enzymes:  Recent Labs Lab 09/03/14 0219 09/03/14 0520 09/03/14 1327  TROPONINI <0.30 <0.30 <0.30   BNP (last 3 results)  Recent Labs  09/03/14 0520  PROBNP 12.6   CBG: No results found for this basename: GLUCAP,  in the last 168 hours  Micro Recent Results (from the past 240 hour(s))  MRSA PCR SCREENING     Status: None   Collection Time    09/03/14  4:52 AM      Result Value Ref Range Status   MRSA by PCR NEGATIVE  NEGATIVE Final   Comment:            The GeneXpert MRSA Assay (FDA     approved for NASAL specimens     only), is one component of a     comprehensive MRSA colonization     surveillance program. It is not     intended to diagnose MRSA     infection nor to guide or     monitor treatment for     MRSA infections.     Studies: Dg Chest Port 1 View  09/06/2014    CLINICAL DATA:  Pneumonia.  EXAM: PORTABLE CHEST - 1 VIEW  COMPARISON:  09/05/2014  FINDINGS: Cardiac silhouette remains mildly enlarged. Lungs are slightly better inflated than on the prior study. Mild, diffuse interstitial prominence does not appear significantly changed. Patchy right lower lobe opacities are stable to minimally improved. No new consolidation is seen. No pleural effusion or pneumothorax is identified.  IMPRESSION: Stable to minimally improved right lower lobe infiltrate.   Electronically Signed   By: Logan Bores   On: 09/06/2014 07:30   Dg Chest Port 1 View  09/05/2014   CLINICAL DATA:  Fever and tachypnea  EXAM: PORTABLE CHEST - 1  VIEW  COMPARISON:  07/01/2014  .  FINDINGS: Right lower lobe infiltrate may represent atelectasis or pneumonia. Left lung clear.  Cardiac enlargement without heart failure or effusion  IMPRESSION: Right lower lobe infiltrate, possible pneumonia.   Electronically Signed   By: Franchot Gallo M.D.   On: 09/05/2014 10:02    Scheduled Meds: . ampicillin-sulbactam (UNASYN) IV  3 g Intravenous Q6H  . atorvastatin  10 mg Oral Daily  . fluticasone  1 spray Each Nare Daily  . folic acid  1 mg Oral Daily  . loratadine  10 mg Oral Daily  . LORazepam  0-4 mg Intravenous Q12H  . LORazepam  2 mg Oral 6 times per day   Or  . LORazepam  2 mg Intravenous 6 times per day  . mirtazapine  7.5 mg Oral QHS  . multivitamin with minerals  1 tablet Oral Daily  . thiamine  100 mg Oral Daily   Continuous Infusions: . dextrose 5 % and 0.9 % NaCl with KCl 20 mEq/L 1,000 mL (09/06/14 0853)       Time spent: 35 minutes    [facility]  [facility] Pager [phone] If 7PM-7AM, please contact night-coverage at [email or web address], password [facility] 09/06/2014, 10:20 AM  LOS: 4 days

## 2014-09-06 NOTE — Progress Notes (Signed)
Name: DEWITT JUDICE MRN: [id number] DOB: November 02, 1946    ADMISSION DATE:  09/02/2014 CONSULTATION DATE:  9/17  REFERRING MD :  [facility] PRIMARY SERVICE:  [facility]  CHIEF COMPLAINT:  ETOH wd  BRIEF PATIENT DESCRIPTION: 68 yo WM NAD  SIGNIFICANT EVENTS / STUDIES:  9/16 admitted fior ETOH wd 9/18 resp distress, ? Increasing agitation, may have been oversedated   HISTORY OF PRESENT ILLNESS:  68 yo WM admitted 9/16 per [facility] service with falls, reported long term ETOH use of > pint whiskey daily for years. He is very tremulous and is experiencing occasional clonic jerking of extremities,  although orientated  x 3, aware of his jerking legs and reports difficulty sleeping. He is receiving large doses of ativan but is in no respiratory distress.  PCCM asked to evaluate for precedex to augment current sedation.   SUBJECTIVE:  Much improved. Off precedex.    VITAL SIGNS: Temp:  [98.5 F (36.9 C)-101.4 F (38.6 C)] 98.5 F (36.9 C) (09/19 0000) Pulse Rate:  [68-93] 85 (09/19 0400) Resp:  [22-46] 26 (09/19 0400) BP: (107-179)/(54-99) 154/99 mmHg (09/19 0400) SpO2:  [93 %-100 %] 98 % (09/19 0400)  PHYSICAL EXAMINATION: General:  WNWDWM  Neuro:  Awake and alert, . No acute resp distress HEENT: No JVD/LAN Cardiovascular: HSR RRR Lungs:  clearer Abdomen:  Soft +bs Musculoskeletal:  Intact Skin: Multiple abrasions , ecchymosis of face and extremities cw falls.    Recent Labs Lab 09/04/14 0400 09/05/14 0335 09/06/14 0345  NA 136* 137 139  K 3.7 3.6* 3.1*  CL 102 101 106  CO2 20 19 18*  BUN 17 7 10   CREATININE 0.82 0.68 0.73  GLUCOSE 98 122* 129*    Recent Labs Lab 09/04/14 0400 09/05/14 0335 09/06/14 0345  HGB 12.9* 14.8 12.7*  HCT 36.5* 40.5 36.1*  WBC 6.7 9.2 9.6  PLT 186 198 182   Dg Chest Port 1 View  09/06/2014   CLINICAL DATA:  Pneumonia.  EXAM: PORTABLE CHEST - 1 VIEW  COMPARISON:  09/05/2014  FINDINGS: Cardiac silhouette remains mildly enlarged. Lungs are  slightly better inflated than on the prior study. Mild, diffuse interstitial prominence does not appear significantly changed. Patchy right lower lobe opacities are stable to minimally improved. No new consolidation is seen. No pleural effusion or pneumothorax is identified.  IMPRESSION: Stable to minimally improved right lower lobe infiltrate.   Electronically Signed   By: Logan Bores   On: 09/06/2014 07:30   Dg Chest Port 1 View  09/05/2014   CLINICAL DATA:  Fever and tachypnea  EXAM: PORTABLE CHEST - 1 VIEW  COMPARISON:  07/01/2014  .  FINDINGS: Right lower lobe infiltrate may represent atelectasis or pneumonia. Left lung clear.  Cardiac enlargement without heart failure or effusion  IMPRESSION: Right lower lobe infiltrate, possible pneumonia.   Electronically Signed   By: Franchot Gallo M.D.   On: 09/05/2014 10:02    ASSESSMENT  Active Problems:    Acute respiratory failure    Aspiration pneumonia    Alcohol withdrawal     Lethargy, complicated from oversedation    Syncope   Syncope and collapse   Scalp laceration   Alcohol abuse   Non-compliant behavior   Gout  68 yo WM admitted 9/16 per [facility] service with falls, reported long term ETOH use of > pint whiskey daily for years. He is much improved 9/19   PLAN: Cont Ciwa protocol for control of his withdrawal  Cont Unasyn added (aspiration PNA)  9/18 >>  Mobilize the pt Get condum cath off  PCCM avail prn    Glyn Ade  [phone]  Cell  [phone]  If no response or cell goes to voicemail, call beeper [phone]  09/06/2014, 7:39 AM

## 2014-09-07 ENCOUNTER — Inpatient Hospital Stay (HOSPITAL_COMMUNITY): Payer: Medicare Other

## 2014-09-07 LAB — COMPREHENSIVE METABOLIC PANEL
ALK PHOS: 71 U/L (ref 39–117)
ALT: 48 U/L (ref 0–53)
AST: 46 U/L — AB (ref 0–37)
Albumin: 2.8 g/dL — ABNORMAL LOW (ref 3.5–5.2)
Anion gap: 13 (ref 5–15)
BILIRUBIN TOTAL: 1 mg/dL (ref 0.3–1.2)
BUN: 6 mg/dL (ref 6–23)
CHLORIDE: 105 meq/L (ref 96–112)
CO2: 21 mEq/L (ref 19–32)
Calcium: 7.9 mg/dL — ABNORMAL LOW (ref 8.4–10.5)
Creatinine, Ser: 0.66 mg/dL (ref 0.50–1.35)
GFR calc Af Amer: >90 mL/min (ref >90)
GFR calc non Af Amer: >90 mL/min (ref >90)
Glucose, Bld: 118 mg/dL — ABNORMAL HIGH (ref 70–99)
Potassium: 3.2 mEq/L — ABNORMAL LOW (ref 3.7–5.3)
Sodium: 139 mEq/L (ref 137–147)
TOTAL PROTEIN: 6.4 g/dL (ref 6.0–8.3)

## 2014-09-07 LAB — CULTURE, RESPIRATORY W GRAM STAIN: Culture: NORMAL

## 2014-09-07 LAB — URINALYSIS, ROUTINE W REFLEX MICROSCOPIC
BILIRUBIN URINE: NEGATIVE
Glucose, UA: NEGATIVE mg/dL
Hgb urine dipstick: NEGATIVE
KETONES UR: NEGATIVE mg/dL
Leukocytes, UA: NEGATIVE
NITRITE: NEGATIVE
PROTEIN: NEGATIVE mg/dL
Specific Gravity, Urine: 1.016 (ref 1.005–1.030)
UROBILINOGEN UA: 4 mg/dL — AB (ref 0.0–1.0)
pH: 7 (ref 5.0–8.0)

## 2014-09-07 LAB — URIC ACID: Uric Acid, Serum: 5.4 mg/dL (ref 4.0–7.8)

## 2014-09-07 LAB — MAGNESIUM: MAGNESIUM: 1.6 mg/dL (ref 1.5–2.5)

## 2014-09-07 LAB — PHOSPHORUS: Phosphorus: 2 mg/dL — ABNORMAL LOW (ref 2.3–4.6)

## 2014-09-07 MED ORDER — POTASSIUM CHLORIDE CRYS ER 20 MEQ PO TBCR
40.0000 meq | EXTENDED_RELEASE_TABLET | Freq: Four times a day (QID) | ORAL | Status: AC
  Administered 2014-09-07 (×2): 40 meq via ORAL
  Filled 2014-09-07 (×2): qty 2

## 2014-09-07 MED ORDER — MAGNESIUM SULFATE 40 MG/ML IJ SOLN
2.0000 g | Freq: Once | INTRAMUSCULAR | Status: AC
  Administered 2014-09-07: 2 g via INTRAVENOUS
  Filled 2014-09-07: qty 50

## 2014-09-07 MED ORDER — VANCOMYCIN HCL IN DEXTROSE 1-5 GM/200ML-% IV SOLN
1000.0000 mg | Freq: Three times a day (TID) | INTRAVENOUS | Status: DC
  Administered 2014-09-07 – 2014-09-08 (×3): 1000 mg via INTRAVENOUS
  Filled 2014-09-07 (×5): qty 200

## 2014-09-07 MED ORDER — LORAZEPAM 1 MG PO TABS
2.0000 mg | ORAL_TABLET | Freq: Four times a day (QID) | ORAL | Status: DC | PRN
  Administered 2014-09-07 – 2014-09-09 (×8): 2 mg via ORAL
  Filled 2014-09-07 (×9): qty 2

## 2014-09-07 MED ORDER — CETYLPYRIDINIUM CHLORIDE 0.05 % MT LIQD
7.0000 mL | Freq: Two times a day (BID) | OROMUCOSAL | Status: DC
  Administered 2014-09-07 – 2014-09-11 (×8): 7 mL via OROMUCOSAL

## 2014-09-07 MED ORDER — PIPERACILLIN-TAZOBACTAM 3.375 G IVPB
3.3750 g | Freq: Three times a day (TID) | INTRAVENOUS | Status: DC
  Administered 2014-09-07 – 2014-09-08 (×3): 3.375 g via INTRAVENOUS
  Filled 2014-09-07 (×4): qty 50

## 2014-09-07 MED ORDER — LORAZEPAM 1 MG PO TABS
2.0000 mg | ORAL_TABLET | Freq: Once | ORAL | Status: AC
  Administered 2014-09-07: 2 mg via ORAL

## 2014-09-07 NOTE — Progress Notes (Signed)
[facility] PROGRESS NOTE   Joshua Henson URK:[id number] DOB: November 30, 1946 DOA: 09/02/2014 PCP: Adella Hare, MD  HPI/Subjective: Awake, alert and oriented x3.  Per nursing staff was confused last night needed extra doses of Ativan. Discussed with his significant other [name]) over the phone. 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  Assessment/Plan: Active Problems:   Syncope   Syncope and collapse   Scalp laceration   Alcohol abuse   Non-compliant behavior   Alcohol withdrawal   Acute respiratory failure   Aspiration pneumonia   Lethargy     Alcohol withdrawal  -Patient started on CIWA protocol, patient drinks a pint of vodka per day. -He was recently in the hospital for stroke in July of 2015, also went through detoxification. -Continue to monitor and stepdown. -Placed on Precedex for brief period of time, off of it, continue CIWA protocol. -More awake, alert and conversant. -Was complaining about right elbow pain and both of his great toes, uric acid is normal I will start NSAIDs.  Pneumonia, likely aspiration -Patient developed fever of 101.4 yesterday, and tachypnea. -Chest x-ray showed right lower lobe infiltrates. -Started on Unasyn, add mucolytics, antitussives and oxygen as needed.  Alcohol abuse  -Patient continues to drink significant amounts of alcohol, alcohol level on admission was 313. -Continue multivitamins, folate and thiamine, banana bag daily. 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  Transaminitis  -Most likely secondary to alcohol abuse continue to monitor. 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  -2-D echocardiogram from 07/02/2014 showed normal LVEF. -Syncope likely secondary to alcohol intoxication plus Mnire's  disease.  Depression  -Will continue Remeron at current dose as abrupt cessation may cause suicidal thoughts, seizures etc.   Scalp laceration  -Stable   Noncompliant behavior  -As patient going through withdrawal right now, probably is not aware about what he is doing. -Monitor patient, if he wants to leave AMA I will probably recommend the IVC.  Mnire's disease?  -Unsure patient has received a diagnosis of this disease process. Would check with patient's family to determine if and when patient was seen by a neurologist who diagnosed with Mnire's.  Right elbow pain  -Patient also has bilateral great toe base pain, denies any history of gout. -The pain and the warmth of the joints suspicious for gout, check uric acid start patient on colchicine.  Hypokalemia -Hypokalemia with mild acidosis, along with hypomagnesemia and low phosphorus. -This is likely secondary to alcohol and Kaliuresis, patient also getting IV fluids without potassium.  Code Status: Full code Family Communication: Plan discussed with the patient. Disposition Plan: Remains inpatient   Consultants:  PCCM  Procedures:  None  Antibiotics:  None   Objective: Filed Vitals:   09/07/14 0827  BP: 163/96  Pulse: 86  Temp:   Resp:     Intake/Output Summary (Last 24 hours) at 09/07/14 1112 Last data filed at 09/07/14 0600  Gross per 24 hour  Intake   2150 ml  Output   1800 ml  Net    350 ml   Filed Weights   09/04/14 0400 09/05/14 0013 09/07/14 0521  Weight: 94.8 kg (208 lb 15.9 oz) 97.3 kg (214 lb 8.1 oz) 93.7 kg (206 lb 9.1 oz)    Exam: General: Alert and awake, oriented x3, not in any acute distress. HEENT: anicteric sclera, pupils reactive to light  and accommodation, EOMI CVS: S1-S2 clear, no murmur rubs or gallops Chest: clear to auscultation bilaterally, no wheezing, rales or rhonchi Abdomen: soft nontender, nondistended, normal bowel sounds, no organomegaly Extremities: no cyanosis,  clubbing or edema noted bilaterally Neuro: Cranial nerves II-XII intact, no focal neurological deficits  Data Reviewed: Basic Metabolic Panel:  Recent Labs Lab 09/02/14 2316 09/03/14 0219 09/03/14 0520 09/04/14 0400 09/05/14 0335 09/06/14 0345 09/07/14 0333  NA 142  --  142 136* 137 139 139  K 3.8  --  3.5* 3.7 3.6* 3.1* 3.2*  CL 98  --  99 102 101 106 105  CO2 23  --  21 20 19  18* 21  GLUCOSE 111*  --  83 98 122* 129* 118*  BUN 15  --  15 17 7 10 6   CREATININE 0.87  --  0.83 0.82 0.68 0.73 0.66  CALCIUM 10.0  --  9.6 8.5 9.0 8.0* 7.9*  MG 2.3  --  1.9 1.8 1.2* 1.4* 1.6  PHOS 3.2 2.8  --   --   --  1.4* 2.0*   Liver Function Tests:  Recent Labs Lab 09/02/14 2316 09/03/14 0520 09/04/14 0400 09/05/14 0335 09/07/14 0333  AST 192* 147* 78* 68* 46*  ALT 202* 154* 96* 78* 48  ALKPHOS 95 79 68 73 71  BILITOT 0.9 0.8 1.1 1.3* 1.0  PROT 9.0* 7.5 6.6 7.3 6.4  ALBUMIN 4.8 4.0 3.4* 3.7 2.8*   No results found for this basename: LIPASE, AMYLASE,  in the last 168 hours No results found for this basename: AMMONIA,  in the last 168 hours CBC:  Recent Labs Lab 09/02/14 2316 09/03/14 0520 09/04/14 0400 09/05/14 0335 09/06/14 0345  WBC 6.8 6.0 6.7 9.2 9.6  NEUTROABS 4.0  --   --   --   --   HGB 16.7 14.6 12.9* 14.8 12.7*  HCT 46.8 41.7 36.5* 40.5 36.1*  MCV 99.8 100.0 100.8* 98.8 100.3*  PLT 229 202 186 198 182   Cardiac Enzymes:  Recent Labs Lab 09/03/14 0219 09/03/14 0520 09/03/14 1327  TROPONINI <0.30 <0.30 <0.30   BNP (last 3 results)  Recent Labs  09/03/14 0520  PROBNP 12.6   CBG: No results found for this basename: GLUCAP,  in the last 168 hours  Micro Recent Results (from the past 240 hour(s))  MRSA PCR SCREENING     Status: None   Collection Time    09/03/14  4:52 AM      Result Value Ref Range Status   MRSA by PCR NEGATIVE  NEGATIVE Final   Comment:            The GeneXpert MRSA Assay (FDA     approved for NASAL specimens     only), is  one component of a     comprehensive MRSA colonization     surveillance program. It is not     intended to diagnose MRSA     infection nor to guide or     monitor treatment for     MRSA infections.  CULTURE, RESPIRATORY (NON-EXPECTORATED)     Status: None   Collection Time    09/05/14 12:58 PM      Result Value Ref Range Status   Specimen Description SPUTUM   Final   Special Requests NONE   Final   Gram Stain     Final   Value: FEW WBC PRESENT,BOTH PMN AND MONONUCLEAR     RARE SQUAMOUS EPITHELIAL CELLS PRESENT  ABUNDANT GRAM POSITIVE COCCI     IN PAIRS IN CLUSTERS MODERATE GRAM POSITIVE RODS     Performed at [facility]   Culture     Final   Value: Culture reincubated for better growth     Performed at [facility]   Report Status PENDING   Incomplete     Studies: Dg Chest Port 1 View  09/06/2014   CLINICAL DATA:  Pneumonia.  EXAM: PORTABLE CHEST - 1 VIEW  COMPARISON:  09/05/2014  FINDINGS: Cardiac silhouette remains mildly enlarged. Lungs are slightly better inflated than on the prior study. Mild, diffuse interstitial prominence does not appear significantly changed. Patchy right lower lobe opacities are stable to minimally improved. No new consolidation is seen. No pleural effusion or pneumothorax is identified.  IMPRESSION: Stable to minimally improved right lower lobe infiltrate.   Electronically Signed   By: Logan Bores   On: 09/06/2014 07:30    Scheduled Meds: . ampicillin-sulbactam (UNASYN) IV  3 g Intravenous Q6H  . antiseptic oral rinse  7 mL Mouth Rinse BID  . atorvastatin  10 mg Oral Daily  . fluticasone  1 spray Each Nare Daily  . folic acid  1 mg Oral Daily  . loratadine  10 mg Oral Daily  . LORazepam  0-4 mg Intravenous Q12H  . mirtazapine  7.5 mg Oral QHS  . multivitamin with minerals  1 tablet Oral Daily  . phosphorus  500 mg Oral BID  . thiamine  100 mg Oral Daily   Continuous Infusions: . dextrose 5 % and 0.9 % NaCl with KCl 20 mEq/L  1,000 mL (09/07/14 0904)       Time spent: 35 minutes    [facility] A  [facility] Pager [phone] If 7PM-7AM, please contact night-coverage at [email or web address], password [email or web address] 09/07/2014, 11:12 AM  LOS: 5 days

## 2014-09-07 NOTE — Progress Notes (Signed)
Pt has been agitated and restless during the night.  MD notified one dose of additional ativan given per orders.  Pt remains confused at times.

## 2014-09-07 NOTE — Progress Notes (Signed)
ANTIBIOTIC CONSULT NOTE - INITIAL  Pharmacy Consult for Vancomycin and Zosyn Indication: aspiration pneumonia  Allergies  Allergen Reactions  . Celebrex [Celecoxib] Rash    Patient Measurements: Height: 5\' 10"  (177.8 cm) Weight: 206 lb 9.1 oz (93.7 kg) IBW/kg (Calculated) : 73  Vital Signs: Temp: 98.7 F (37.1 C) (09/20 0800) Temp src: Oral (09/20 0800) BP: 160/90 mmHg (09/20 1200) Pulse Rate: 96 (09/20 1200) Intake/Output from previous day: 09/19 0701 - 09/20 0700 In: 2620 [I.V.:2000; IV Piggyback:450] Out: 1800 [Urine:1800] Intake/Output from this shift: Total I/O In: 600 [I.V.:500; IV Piggyback:100] Out: -   Labs:  Recent Labs  09/05/14 0335 09/06/14 0345 09/07/14 0333  WBC 9.2 9.6  --   HGB 14.8 12.7*  --   PLT 198 182  --   CREATININE 0.68 0.73 0.66   Estimated Creatinine Clearance: 104.4 ml/min (by C-G formula based on Cr of 0.66). No results found for this basename: VANCOTROUGH, Corlis Leak, VANCORANDOM, [place], GENTPEAK, GENTRANDOM, TOBRATROUGH, TOBRAPEAK, TOBRARND, AMIKACINPEAK, AMIKACINTROU, AMIKACIN,  in the last 72 hours   Microbiology: Recent Results (from the past 720 hour(s))  MRSA PCR SCREENING     Status: None   Collection Time    09/03/14  4:52 AM      Result Value Ref Range Status   MRSA by PCR NEGATIVE  NEGATIVE Final   Comment:            The GeneXpert MRSA Assay (FDA     approved for NASAL specimens     only), is one component of a     comprehensive MRSA colonization     surveillance program. It is not     intended to diagnose MRSA     infection nor to guide or     monitor treatment for     MRSA infections.  CULTURE, RESPIRATORY (NON-EXPECTORATED)     Status: None   Collection Time    09/05/14 12:58 PM      Result Value Ref Range Status   Specimen Description SPUTUM   Final   Special Requests NONE   Final   Gram Stain     Final   Value: FEW WBC PRESENT,BOTH PMN AND MONONUCLEAR     RARE SQUAMOUS EPITHELIAL CELLS PRESENT   ABUNDANT GRAM POSITIVE COCCI     IN PAIRS IN CLUSTERS MODERATE GRAM POSITIVE RODS     Performed at [facility]   Culture     Final   Value: NORMAL OROPHARYNGEAL FLORA     Performed at [facility]   Report Status PENDING   Incomplete   Assessment: 68 yo male admitted 9/16 with falls, reported 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 Patient started on Ativan protocol for alcohol withdrawal but then experienced worsened agitation and possible DTs last PM and was started on Precedex drip and CIWA protocol continued. Patient became lethargic AM 9/18 likely oversedated and experiencing acute resp failure found to have aspiration PNA per CXR to start Unasyn per pharmacy dosing. Switching to Vanc and Zosyn d/t GPC in sputum/potential for MRSA.  9/18 >> Unasyn >> 9/20 9/20 >> Zosyn >> 9/20 >> Vanc >>  Tmax: 101.4 AM 9/18, afebrile since WBCs: 9.6, stable Renal: Scr 0.66, stable, CrCl 104  9/18 sputum: GPC, GPR  Goal of Therapy:  Vanc trough 15-20mg /l treatment of aspiration PNA  Plan:   Vancomycin 1g IV q8h.  Zosyn 3.375g IV Q8H infused over 4hrs.  Measure Vanc trough at steady state.  Follow up renal fxn and culture results.  Romeo Rabon, PharmD, pager [phone]. 09/07/2014,1:13 PM.

## 2014-09-08 LAB — RENAL FUNCTION PANEL
Albumin: 2.8 g/dL — ABNORMAL LOW (ref 3.5–5.2)
Anion gap: 13 (ref 5–15)
BUN: 6 mg/dL (ref 6–23)
CO2: 19 mEq/L (ref 19–32)
Calcium: 8.3 mg/dL — ABNORMAL LOW (ref 8.4–10.5)
Chloride: 107 mEq/L (ref 96–112)
Creatinine, Ser: 0.68 mg/dL (ref 0.50–1.35)
GFR calc Af Amer: >90 mL/min (ref >90)
GFR calc non Af Amer: >90 mL/min (ref >90)
Glucose, Bld: 114 mg/dL — ABNORMAL HIGH (ref 70–99)
Phosphorus: 2.4 mg/dL (ref 2.3–4.6)
Potassium: 3.5 mEq/L — ABNORMAL LOW (ref 3.7–5.3)
Sodium: 139 mEq/L (ref 137–147)

## 2014-09-08 LAB — CBC
HCT: 34.1 % — ABNORMAL LOW (ref 39.0–52.0)
Hemoglobin: 12.2 g/dL — ABNORMAL LOW (ref 13.0–17.0)
MCH: 35.2 pg — ABNORMAL HIGH (ref 26.0–34.0)
MCHC: 35.8 g/dL (ref 30.0–36.0)
MCV: 98.3 fL (ref 78.0–100.0)
Platelets: 223 10*3/uL (ref 150–400)
RBC: 3.47 MIL/uL — ABNORMAL LOW (ref 4.22–5.81)
RDW: 13.4 % (ref 11.5–15.5)
WBC: 8 10*3/uL (ref 4.0–10.5)

## 2014-09-08 LAB — CLOSTRIDIUM DIFFICILE BY PCR: Toxigenic C. Difficile by PCR: POSITIVE — AB

## 2014-09-08 LAB — MAGNESIUM: Magnesium: 1.7 mg/dL (ref 1.5–2.5)

## 2014-09-08 LAB — VANCOMYCIN, TROUGH: VANCOMYCIN TR: 11 ug/mL (ref 10.0–20.0)

## 2014-09-08 MED ORDER — VANCOMYCIN HCL 10 G IV SOLR
1250.0000 mg | Freq: Three times a day (TID) | INTRAVENOUS | Status: DC
  Administered 2014-09-08: 1250 mg via INTRAVENOUS
  Filled 2014-09-08: qty 1250

## 2014-09-08 MED ORDER — FUROSEMIDE 10 MG/ML IJ SOLN
INTRAMUSCULAR | Status: AC
  Filled 2014-09-08: qty 2

## 2014-09-08 MED ORDER — LORAZEPAM 2 MG/ML IJ SOLN
1.0000 mg | Freq: Once | INTRAMUSCULAR | Status: AC
  Administered 2014-09-08: 1 mg via INTRAVENOUS
  Filled 2014-09-08: qty 1

## 2014-09-08 MED ORDER — INDOMETHACIN ER 75 MG PO CPCR
75.0000 mg | ORAL_CAPSULE | Freq: Two times a day (BID) | ORAL | Status: DC
  Administered 2014-09-08 – 2014-09-09 (×2): 75 mg via ORAL
  Filled 2014-09-08 (×5): qty 1

## 2014-09-08 MED ORDER — FUROSEMIDE 10 MG/ML IJ SOLN
INTRAMUSCULAR | Status: AC
  Filled 2014-09-08: qty 4

## 2014-09-08 MED ORDER — SODIUM CHLORIDE 0.9 % IV SOLN
INTRAVENOUS | Status: DC
  Administered 2014-09-08: 15:00:00 via INTRAVENOUS

## 2014-09-08 MED ORDER — HALOPERIDOL LACTATE 5 MG/ML IJ SOLN
5.0000 mg | Freq: Two times a day (BID) | INTRAMUSCULAR | Status: DC | PRN
  Administered 2014-09-11: 5 mg via INTRAVENOUS
  Filled 2014-09-08: qty 1

## 2014-09-08 MED ORDER — METRONIDAZOLE 500 MG PO TABS
500.0000 mg | ORAL_TABLET | Freq: Three times a day (TID) | ORAL | Status: DC
  Administered 2014-09-08 – 2014-09-12 (×11): 500 mg via ORAL
  Filled 2014-09-08 (×13): qty 1

## 2014-09-08 MED ORDER — MAGNESIUM SULFATE 40 MG/ML IJ SOLN
2.0000 g | Freq: Four times a day (QID) | INTRAMUSCULAR | Status: AC
  Administered 2014-09-08 (×2): 2 g via INTRAVENOUS
  Filled 2014-09-08 (×2): qty 50

## 2014-09-08 MED ORDER — POTASSIUM CHLORIDE CRYS ER 20 MEQ PO TBCR
60.0000 meq | EXTENDED_RELEASE_TABLET | Freq: Four times a day (QID) | ORAL | Status: AC
  Administered 2014-09-08 (×2): 60 meq via ORAL
  Filled 2014-09-08 (×3): qty 3

## 2014-09-08 MED ORDER — LORAZEPAM 2 MG/ML IJ SOLN: 2.0000 mg | Freq: Once | INTRAMUSCULAR | Status: DC

## 2014-09-08 MED ORDER — FUROSEMIDE 10 MG/ML IJ SOLN
60.0000 mg | Freq: Once | INTRAMUSCULAR | Status: AC
  Administered 2014-09-08: 60 mg via INTRAVENOUS
  Filled 2014-09-08: qty 6

## 2014-09-08 MED ORDER — SACCHAROMYCES BOULARDII 250 MG PO CAPS
250.0000 mg | ORAL_CAPSULE | Freq: Two times a day (BID) | ORAL | Status: DC
  Administered 2014-09-08 – 2014-09-12 (×8): 250 mg via ORAL
  Filled 2014-09-08 (×8): qty 1

## 2014-09-08 NOTE — Progress Notes (Signed)
[facility] PROGRESS NOTE   Joshua Henson BZJ:[id number] DOB: Apr 29, 1946 DOA: 09/02/2014 PCP: Adella Hare, MD  HPI/Subjective: Patient is sleepy when was seen this morning, according to nursing staff he was confused last night. Minimize benzodiazepines as he is been in the hospital for the past 6 days, doubt he still in withdrawal from alcohol. Start physical therapy, transfer to telemetry  Assessment/Plan: Active Problems:   Syncope   Syncope and collapse   Scalp laceration   Alcohol abuse   Non-compliant behavior   Alcohol withdrawal   Acute respiratory failure   Aspiration pneumonia   Lethargy     Alcohol withdrawal  -Patient started on CIWA protocol, patient drinks a pint of vodka per day. -He was recently in the hospital for stroke in July of 2015, also went through detoxification. -Continue to monitor and stepdown. -Placed on Precedex for brief period of time, off of it, continue CIWA protocol. -More awake, alert and conversant. -Was complaining about right elbow pain and both of his great toes, uric acid is normal I will start NSAIDs.  Pneumonia, likely aspiration -Patient developed fever of 101.4, and tachypnea. -Chest x-ray showed right lower lobe infiltrates. -Started on Unasyn, add mucolytics, antitussives and oxygen as needed. -Developed fever while he is on Unasyn, this is switched to Zosyn and vancomycin after appropriate cultures obtained.  Alcohol abuse  -Patient continues to drink significant amounts of alcohol, alcohol level on admission was 313. -Continue multivitamins, folate and thiamine, banana bag daily. 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  Transaminitis  -Most likely secondary to alcohol abuse continue to monitor. 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  -2-D  echocardiogram from 07/02/2014 showed normal LVEF. -Syncope likely secondary to alcohol intoxication plus Mnire's disease.  Depression  -Will continue Remeron at current dose as abrupt cessation may cause suicidal thoughts, seizures etc.   Scalp laceration  -Stable   Noncompliant behavior  -As patient going through withdrawal right now, probably is not aware about what he is doing. -Monitor patient, if he wants to leave AMA I will probably recommend the IVC.  Mnire's disease?  -Unsure patient has received a diagnosis of this disease process. Would check with patient's family to determine if and when patient was seen by a neurologist who diagnosed with Mnire's. 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  Right elbow pain  -Patient also has bilateral great toe base pain, denies any history of gout. -The pain and the warmth of the joints suspicious for gout, check uric acid start patient on colchicine.  Hypokalemia -Hypokalemia with mild acidosis, along with hypomagnesemia and low phosphorus. -This is likely secondary to alcohol and Kaliuresis, patient also getting IV fluids without potassium.  Code Status: Full code Family Communication: Plan discussed with the patient. Disposition Plan: Remains inpatient, transfer to telemetry   Consultants:  PCCM  Procedures:  None  Antibiotics:  None   Objective: Filed Vitals:   09/08/14 1000  BP: 154/96  Pulse: 99  Temp:   Resp: 24    Intake/Output Summary (Last 24 hours) at 09/08/14 1200 Last data filed at 09/08/14 1000  Gross per 24 hour  Intake   2670 ml  Output   1875 ml  Net    795 ml   Filed Weights   09/05/14 0013 09/07/14 0521 09/08/14 0217  Weight:  97.3 kg (214 lb 8.1 oz) 93.7 kg (206 lb 9.1 oz) 94.7 kg (208 lb 12.4 oz)    Exam: General: Alert and awake, oriented x3, not in any acute distress. HEENT: anicteric sclera, pupils reactive to  light and accommodation, EOMI CVS: S1-S2 clear, no murmur rubs or gallops Chest: clear to auscultation bilaterally, no wheezing, rales or rhonchi Abdomen: soft nontender, nondistended, normal bowel sounds, no organomegaly Extremities: no cyanosis, clubbing or edema noted bilaterally Neuro: Cranial nerves II-XII intact, no focal neurological deficits  Data Reviewed: Basic Metabolic Panel:  Recent Labs Lab 09/02/14 2316 09/03/14 0219  09/04/14 0400 09/05/14 0335 09/06/14 0345 09/07/14 0333 09/08/14 0340  NA 142  --   < > 136* 137 139 139 139  K 3.8  --   < > 3.7 3.6* 3.1* 3.2* 3.5*  CL 98  --   < > 102 101 106 105 107  CO2 23  --   < > 20 19 18* 21 19  GLUCOSE 111*  --   < > 98 122* 129* 118* 114*  BUN 15  --   < > 17 7 10 6 6   CREATININE 0.87  --   < > 0.82 0.68 0.73 0.66 0.68  CALCIUM 10.0  --   < > 8.5 9.0 8.0* 7.9* 8.3*  MG 2.3  --   < > 1.8 1.2* 1.4* 1.6 1.7  PHOS 3.2 2.8  --   --   --  1.4* 2.0* 2.4  < > = values in this interval not displayed. Liver Function Tests:  Recent Labs Lab 09/02/14 2316 09/03/14 0520 09/04/14 0400 09/05/14 0335 09/07/14 0333 09/08/14 0340  AST 192* 147* 78* 68* 46*  --   ALT 202* 154* 96* 78* 48  --   ALKPHOS 95 79 68 73 71  --   BILITOT 0.9 0.8 1.1 1.3* 1.0  --   PROT 9.0* 7.5 6.6 7.3 6.4  --   ALBUMIN 4.8 4.0 3.4* 3.7 2.8* 2.8*   No results found for this basename: LIPASE, AMYLASE,  in the last 168 hours No results found for this basename: AMMONIA,  in the last 168 hours CBC:  Recent Labs Lab 09/02/14 2316 09/03/14 0520 09/04/14 0400 09/05/14 0335 09/06/14 0345 09/08/14 0340  WBC 6.8 6.0 6.7 9.2 9.6 8.0  NEUTROABS 4.0  --   --   --   --   --   HGB 16.7 14.6 12.9* 14.8 12.7* 12.2*  HCT 46.8 41.7 36.5* 40.5 36.1* 34.1*  MCV 99.8 100.0 100.8* 98.8 100.3* 98.3  PLT 229 202 186 198 182 223   Cardiac Enzymes:  Recent Labs Lab 09/03/14 0219 09/03/14 0520 09/03/14 1327  TROPONINI <0.30 <0.30 <0.30   BNP (last 3  results)  Recent Labs  09/03/14 0520  PROBNP 12.6   CBG: No results found for this basename: GLUCAP,  in the last 168 hours  Micro Recent Results (from the past 240 hour(s))  MRSA PCR SCREENING     Status: None   Collection Time    09/03/14  4:52 AM      Result Value Ref Range Status   MRSA by PCR NEGATIVE  NEGATIVE Final   Comment:            The GeneXpert MRSA Assay (FDA     approved for NASAL specimens     only), is one component of a     comprehensive MRSA colonization     surveillance program. It is not  intended to diagnose MRSA     infection nor to guide or     monitor treatment for     MRSA infections.  CULTURE, RESPIRATORY (NON-EXPECTORATED)     Status: None   Collection Time    09/05/14 12:58 PM      Result Value Ref Range Status   Specimen Description SPUTUM   Final   Special Requests NONE   Final   Gram Stain     Final   Value: FEW WBC PRESENT,BOTH PMN AND MONONUCLEAR     RARE SQUAMOUS EPITHELIAL CELLS PRESENT     ABUNDANT GRAM POSITIVE COCCI     IN PAIRS IN CLUSTERS MODERATE GRAM POSITIVE RODS     Performed at [facility]   Culture     Final   Value: NORMAL OROPHARYNGEAL FLORA     Performed at [facility]   Report Status 09/07/2014 FINAL   Final  CULTURE, BLOOD (ROUTINE X 2)     Status: None   Collection Time    09/07/14  1:44 PM      Result Value Ref Range Status   Specimen Description BLOOD RIGHT ARM   Final   Special Requests BOTTLES DRAWN AEROBIC ONLY 4CC BLUE BOTTLES    Final   Culture  Setup Time     Final   Value: 09/07/2014 18:13     Performed at [facility]   Culture     Final   Value:        BLOOD CULTURE RECEIVED NO GROWTH TO DATE CULTURE WILL BE HELD FOR 5 DAYS BEFORE ISSUING A FINAL NEGATIVE REPORT     Performed at [facility]   Report Status PENDING   Incomplete  CULTURE, BLOOD (ROUTINE X 2)     Status: None   Collection Time    09/07/14  1:48 PM      Result Value Ref Range Status   Specimen  Description BLOOD RIGHT ARM   Final   Special Requests     Final   Value: BOTTLES DRAWN AEROBIC AND ANAEROBIC 10CC BOTH BOTTLES   Culture  Setup Time     Final   Value: 09/07/2014 18:17     Performed at [facility]   Culture     Final   Value:        BLOOD CULTURE RECEIVED NO GROWTH TO DATE CULTURE WILL BE HELD FOR 5 DAYS BEFORE ISSUING A FINAL NEGATIVE REPORT     Note: Culture results may be compromised due to an excessive volume of blood received in culture bottles.     Performed at [facility]   Report Status PENDING   Incomplete     Studies: Dg Chest Port 1 View  09/07/2014   CLINICAL DATA:  Acute onset shortness of breath earlier today. Followup right basilar pneumonia.  EXAM: PORTABLE CHEST - 1 VIEW  COMPARISON:  Portable chest x-ray yesterday and 09/05/2014. Two-view chest x-ray 07/01/2014.  FINDINGS: Cardiac silhouette mildly to moderately enlarged, unchanged. Suboptimal inspiration accounts for crowded bronchovascular markings diffusely. Previously identified patchy opacities at the medial right lung base have shown continued improvement. New airspace consolidation in the left lower lobe with silhouetting of the lateral left hemidiaphragm. Lungs otherwise clear. Pulmonary vascularity normal. Stable mild elevation the right hemidiaphragm.  IMPRESSION: New left lower lobe atelectasis versus pneumonia. Improved patchy bronchopneumonia in the medial right lung base.   Electronically Signed   By: Sherran Needs.D.  On: 09/07/2014 13:24    Scheduled Meds: . antiseptic oral rinse  7 mL Mouth Rinse BID  . atorvastatin  10 mg Oral Daily  . fluticasone  1 spray Each Nare Daily  . folic acid  1 mg Oral Daily  . loratadine  10 mg Oral Daily  . mirtazapine  7.5 mg Oral QHS  . multivitamin with minerals  1 tablet Oral Daily  . phosphorus  500 mg Oral BID  . piperacillin-tazobactam (ZOSYN)  IV  3.375 g Intravenous 3 times per day  . thiamine  100 mg Oral Daily  .  vancomycin  1,000 mg Intravenous Q8H   Continuous Infusions:       Time spent: 35 minutes    [facility] A  [facility] Pager [phone] If 7PM-7AM, please contact night-coverage at [email or web address], password [facility] 09/08/2014, 12:00 PM  LOS: 6 days

## 2014-09-08 NOTE — Progress Notes (Addendum)
[place] for Vancomycin and Zosyn Indication: aspiration pneumonia  Allergies  Allergen Reactions  . Celebrex [Celecoxib] Rash    Patient Measurements: Height: 5\' 10"  (177.8 cm) Weight: 208 lb 12.4 oz (94.7 kg) IBW/kg (Calculated) : 73  Vital Signs: Temp: 98.1 F (36.7 C) (09/21 0800) Temp src: Oral (09/21 0400) BP: 128/73 mmHg (09/21 0800) Pulse Rate: 88 (09/21 0600) Intake/Output from previous day: 09/20 0701 - 09/21 0700 In: 2950 [I.V.:2100; IV Piggyback:850] Out: 1875 [Urine:1875] Intake/Output from this shift: Total I/O In: 200 [I.V.:200] Out: -   Labs:  Recent Labs  09/06/14 0345 09/07/14 0333 09/08/14 0340  WBC 9.6  --  8.0  HGB 12.7*  --  12.2*  PLT 182  --  223  CREATININE 0.73 0.66 0.68   Estimated Creatinine Clearance: 105 ml/min (by C-G formula based on Cr of 0.68). No results found for this basename: VANCOTROUGH, Corlis Leak, VANCORANDOM, [place], GENTPEAK, GENTRANDOM, TOBRATROUGH, TOBRAPEAK, TOBRARND, AMIKACINPEAK, AMIKACINTROU, AMIKACIN,  in the last 72 hours   Microbiology: Recent Results (from the past 720 hour(s))  MRSA PCR SCREENING     Status: None   Collection Time    09/03/14  4:52 AM      Result Value Ref Range Status   MRSA by PCR NEGATIVE  NEGATIVE Final   Comment:            The GeneXpert MRSA Assay (FDA     approved for NASAL specimens     only), is one component of a     comprehensive MRSA colonization     surveillance program. It is not     intended to diagnose MRSA     infection nor to guide or     monitor treatment for     MRSA infections.  CULTURE, RESPIRATORY (NON-EXPECTORATED)     Status: None   Collection Time    09/05/14 12:58 PM      Result Value Ref Range Status   Specimen Description SPUTUM   Final   Special Requests NONE   Final   Gram Stain     Final   Value: FEW WBC PRESENT,BOTH PMN AND MONONUCLEAR     RARE SQUAMOUS EPITHELIAL CELLS PRESENT     ABUNDANT GRAM POSITIVE  COCCI     IN PAIRS IN CLUSTERS MODERATE GRAM POSITIVE RODS     Performed at [facility]   Culture     Final   Value: NORMAL OROPHARYNGEAL FLORA     Performed at [facility]   Report Status 09/07/2014 FINAL   Final   Assessment: 68 yo male admitted 9/16 with falls, reported 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 Patient started on Ativan protocol for alcohol withdrawal but then experienced worsened agitation and possible DTs last PM and was started on Precedex drip and CIWA protocol continued. Patient became lethargic AM 9/18 likely oversedated and experiencing acute resp failure found to have aspiration PNA per CXR to start Unasyn per pharmacy dosing. Switched to [employer] and Zosyn d/t GPC in sputum/potential for MRSA.    9/18 >> Unasyn >> 9/20 9/20 >> Zosyn >> 9/20 >> Vanc >>  Tmax: 101.4 AM 9/18, afebrile since WBCs: 8.0, stable Renal: Scr 0.68, stable, CrCl 105  9/18 sputum: GPC, GPR 9/20 sputum: normal oropharyngeal flora  Goal of Therapy:  Vanc trough 15-20mg /l treatment of aspiration PNA  Plan:    Respiratory cultures came back negative (9/20).  Should antibiotic therapy be  narrowed (need further MRSA coverage)?  Vancomycin 1g IV q8h.  Zosyn 3.375g IV Q8H infused over 4hrs.  Measure Vanc trough this afternoon.  Follow up renal fxn and culture results.  Peter Garter, PharmD Candidate 09/08/14 9:04 AM   Addendum: 09/08/2014 9:30 AM I agree with the assessment and plan above and will f/u vancomycin trough this afternoon.  Hershal Coria, PharmD, BCPS Pager: [phone] 09/08/2014 9:31 AM   Addendum: 09/08/2014 2:27 PM Vancomycin trough resulted subtherapeutic at 11.0 (goal 15-12 mcg/mL) on 1g IV q8h dosing.  Plan:  1.  Adjust vancomycin to 1250 mg IV q8h and anticipate will achieve goal trough levels with accumulation on q8h dosing. 2.  Will recheck VT when appropriate.  Hershal Coria, PharmD, BCPS Pager:  [phone] 09/08/2014 2:33 PM

## 2014-09-08 NOTE — Care Management Note (Addendum)
    Page 1 of 2   09/12/2014     1:46:33 PM CARE MANAGEMENT NOTE 09/12/2014  Patient:  Joshua Henson, Joshua Henson   Account Number:  [id number]  Date Initiated:  09/08/2014  Documentation initiated by:  DAVIS,RHONDA  Subjective/Objective Assessment:   patient admitted due to etoh problems and w/d     Action/Plan:   tbd was in rehab for detox approx. one week ac admission but rebounded and started drinking heavily.   Anticipated DC Date:  09/12/2014   Anticipated DC Plan:  [place] referral  Clinical Social Worker      DC Planning Services  CM consult      [facility] Choice  NA   Choice offered to / List presented to:  C-1 Patient   DME arranged  NA      DME agency  NA     [place] arranged  HH-2 PT      [place].   Status of service:  Completed, signed off Medicare Important Message given?  YES (If response is "NO", the following Medicare IM given date fields will be blank) Date Medicare IM given:  09/09/2014 Medicare IM given by:  Dessa Phi Date Additional Medicare IM given:  09/12/2014 Additional Medicare IM given by:  Dessa Phi  Discharge Disposition:  [place]  Per UR Regulation:  Reviewed for med. necessity/level of care/duration of stay  If discussed at [place] of Stay Meetings, dates discussed:   09/09/2014  09/11/2014    Comments:  09/12/14 Amahri Dengel RN,BSN NCM [place].[facility] KRISTEN AWARE OF D/C & HHPT ORDER. SPOKE TO PATIENT & FAMILY MEMBER IN RM ABOUT D/C HOME W/HHPT.THEY BOTH VOICED UNDERSTANDING.[facility] REP ALSO EXPLAINED HHPT SERVICES.NO FURTHER D/C NEEDS.  09/11/14 Domingo Fuson RN,BSN NCM [phone] AWAIT HHC ORDERS.  09/10/14 Mikhaela Zaugg RN,BSN NCM [place] K[place]ST[place] AWARE OF REFERRAL.AWAIT FINAL HHPT ORDER,FACE TO FACE.  09/09/14 Bobby Ragan RN,BSN NCM [phone] TRANSFER FROM SDU.PT-HH.  [id number]/KDTOIZ Davis,RN,BSN,CCM

## 2014-09-08 NOTE — Progress Notes (Signed)
Pt has been agitated and restless. MD notified one dose of additional ativan given per orders. Pt remains confused at times.

## 2014-09-08 NOTE — Progress Notes (Deleted)
CARE MANAGEMENT NOTE 09/08/2014  Patient:  KARVER, FADDEN   Account Number:  [id number]  Date Initiated:  09/08/2014  Documentation initiated by:  DAVIS,RHONDA  Subjective/Objective Assessment:   patient admitted due to etoh problems and w/d     Action/Plan:   tbd was in rehab for detox approx. one week ac admission but rebounded and started drinking heavily.   Anticipated DC Date:  09/11/2014   Anticipated DC Plan:  [place] referral  Clinical Social Worker      DC Planning Services  CM consult      [facility] Choice  NA   Choice offered to / List presented to:  NA   DME arranged  NA      DME agency  NA     [place] arranged  NA      [place] agency  NA   Status of service:  In process, will continue to follow Medicare Important Message given?  NA - LOS <3 / Initial given by admissions (If response is "NO", the following Medicare IM given date fields will be blank) Date Medicare IM given:   Medicare IM given by:   Date Additional Medicare IM given:   Additional Medicare IM given by:    Discharge Disposition:    Per UR Regulation:  Reviewed for med. necessity/level of care/duration of stay  If discussed at [place] of Stay Meetings, dates discussed:    Comments:  [id number]/Rhonda Davis,RN,BSN,CCM

## 2014-09-08 NOTE — Evaluation (Signed)
Occupational Therapy Evaluation and Discharge Patient Details Name: Joshua Henson MRN: [id number] DOB: 01-25-1946 Today's Date: 09/08/2014    History of Present Illness 68 yo male admitted with ETOH withdrawal, falls. Hx of CVA, BPPV, Meniere's disease per pt, ETOH abuse, prostate cancer, HTN, HTN, non compliance   Clinical Impression   This 68 yo male admitted with above presents to acute OT at an overall S level only due to his history of recent falls. He reports his girlfriend can be there with him. No further OT needs, we will sign off.    Follow Up Recommendations  No OT follow up    Equipment Recommendations  None recommended by OT       Precautions / Restrictions Precautions Precautions: Fall Restrictions Weight Bearing Restrictions: No      Mobility Bed Mobility Overal bed mobility: Needs Assistance Bed Mobility: Supine to Sit     Supine to sit: Supervision        Transfers Overall transfer level: Needs assistance Equipment used: Rolling walker (2 wheeled) Transfers: Sit to/from Stand Sit to Stand: Min assist         General transfer comment: Multiple attempts to get to standing. VCS safety, hand placement    Balance Overall balance assessment: Needs assistance;History of Falls         Standing balance support: Bilateral upper extremity supported;During functional activity Standing balance-Leahy Scale: Poor                              ADL                                         General ADL Comments: Overall at a S level due to recent history of multilple falls               Pertinent Vitals/Pain Pain Assessment: Faces Faces Pain Scale: Hurts a little bit Pain Location: right knee and right elbow Pain Descriptors / Indicators: Aching Pain Intervention(s): Monitored during session     Hand Dominance Right   Extremity/Trunk Assessment Upper Extremity Assessment Upper Extremity Assessment: Overall WFL  for tasks assessed   Lower Extremity Assessment Lower Extremity Assessment: Generalized weakness   Cervical / Trunk Assessment Cervical / Trunk Assessment: Normal   Communication Communication Communication: HOH   Cognition Arousal/Alertness: Awake/alert Behavior During Therapy: WFL for tasks assessed/performed Overall Cognitive Status: Within Functional Limits for tasks assessed                                Home Living Family/patient expects to be discharged to:: Private residence Living Arrangements: Spouse/significant other (works but can be available per pt) Available Help at Discharge:  (significant other)   Home Access: Stairs to enter [employer] of Steps: 2 in garage without rail, 3 at front door with rails on both sides Entrance Stairs-Rails: None Home Layout: Two level;Able to live on main level with bedroom/bathroom Alternate Level Stairs-Number of Steps: 13 Alternate Level Stairs-Rails: Left Bathroom Shower/Tub: Walk-in shower;Door   [employer] Toilet: Standard     Home Equipment: Walker - standard;Shower seat - built in   Additional Comments: Pt rarely goes upstairs      Prior Functioning/Environment Level of Independence: Independent        Comments: Engineer, 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  Vertigo issues since 10/2013             OT Goals(Current goals can be found in the care plan section) Acute Rehab OT Goals Patient Stated Goal: home  OT Frequency:             Co-evaluation PT/OT/SLP Co-Evaluation/Treatment: Yes Reason for Co-Treatment: Necessary to address cognition/behavior during functional activity   OT goals addressed during session: ADL's and self-care      End of Session Equipment Utilized During Treatment: Gait belt;Rolling walker  Activity Tolerance: Patient tolerated treatment well Patient left: in chair;with call bell/phone within reach;with chair alarm set   Time: 0177-9390 OT Time Calculation (min):  24 min Charges:  OT General Charges $OT Visit: 1 Procedure OT Evaluation $Initial OT Evaluation Tier I: 1 Procedure  Almon Register [phone] 09/08/2014, 4:08 PM

## 2014-09-08 NOTE — Evaluation (Signed)
Physical Therapy Evaluation Patient Details Name: Joshua Henson MRN: [id number] DOB: 1946/03/23 Today's Date: 09/08/2014   History of Present Illness  68 yo male admitted with ETOH withdrawal, falls. Hx of CVA, BPPV, Meniere's disease per pt, ETOH abuse, prostate cancer, HTN, HTN, non compliance  Clinical Impression  On eval, pt required Min assist for mobility-able to ambulate ~150 feet with RW. Unsteady especially with initial standing. Remains at risk for falls. Pt states he has a walker at home that he could use-unsure if it is RW vs standard walker. Denied dizziness during session.     Follow Up Recommendations Home health PT;Supervision/Assistance - 24 hour (initially)    Equipment Recommendations  Rolling walker with 5" wheels (if pt does not already have)    Recommendations for Other Services OT consult     Precautions / Restrictions Precautions Precautions: Fall Restrictions Weight Bearing Restrictions: No      Mobility  Bed Mobility Overal bed mobility: Needs Assistance Bed Mobility: Supine to Sit     Supine to sit: Supervision        Transfers Overall transfer level: Needs assistance Equipment used: Rolling walker (2 wheeled) Transfers: Sit to/from Stand Sit to Stand: Min assist         General transfer comment: Multiple attempts to get to standing. VCS safety, hand placement  Ambulation/Gait Ambulation/Gait assistance: Min assist Ambulation Distance (Feet): 150 Feet Assistive device: Rolling walker (2 wheeled) Gait Pattern/deviations: Step-through pattern;Decreased stride length;Trunk flexed     General Gait Details: slow gait speed. tolerated fairly well. denied dizzines with head turns.   Stairs            Wheelchair Mobility    Modified Rankin (Stroke Patients Only)       Balance Overall balance assessment: Needs assistance;History of Falls         Standing balance support: Bilateral upper extremity supported;During  functional activity Standing balance-Leahy Scale: Poor                               Pertinent Vitals/Pain Pain Assessment: Faces Faces Pain Scale: Hurts a little bit Pain Location: toes, r elbow Pain Intervention(s): Monitored during session    Home Living Family/patient expects to be discharged to:: Private residence Living Arrangements: Spouse/significant other (works but can be available per pt) Available Help at Discharge:  (significant other)   Home Access: Stairs to enter Entrance Stairs-Rails: None Entrance Stairs-Number of Steps: 2 in garage without rail, 3 at front door with rails on both sides Home Layout: Two level;Able to live on main level with bedroom/bathroom Home Equipment: Walker - standard      Prior Function Level of Independence: Independent         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  Vertigo issues since 10/2013     Hand Dominance   Dominant Hand: Right    Extremity/Trunk Assessment   Upper Extremity Assessment: Defer to OT evaluation           Lower Extremity Assessment: Generalized weakness      Cervical / Trunk Assessment: Normal  Communication   Communication: HOH  Cognition Arousal/Alertness: Awake/alert Behavior During Therapy: WFL for tasks assessed/performed Overall Cognitive Status: Within Functional Limits for tasks assessed                      General Comments      Exercises  Assessment/Plan    PT Assessment Patient needs continued PT services  PT Diagnosis Difficulty walking;Generalized weakness;Acute pain   PT Problem List Decreased strength;Decreased activity tolerance;Decreased balance;Decreased mobility;Decreased knowledge of use of DME;Pain  PT Treatment Interventions DME instruction;Gait training;Functional mobility training;Therapeutic activities;Balance training;Therapeutic exercise   PT Goals (Current goals can be found in the Care Plan section) Acute Rehab PT  Goals Patient Stated Goal: home PT Goal Formulation: With patient Time For Goal Achievement: 09/22/14 Potential to Achieve Goals: Fair    Frequency Min 3X/week   Barriers to discharge        Co-evaluation               End of Session Equipment Utilized During Treatment: Gait belt Activity Tolerance: Patient tolerated treatment well Patient left: in chair;with call bell/phone within reach;with chair alarm set           Time: 0867-6195 PT Time Calculation (min): 23 min   Charges:   PT Evaluation $Initial PT Evaluation Tier I: 1 Procedure PT Treatments $Gait Training: 8-22 mins   PT G Codes:          Weston Anna, MPT Pager: [phone]

## 2014-09-09 ENCOUNTER — Inpatient Hospital Stay (HOSPITAL_COMMUNITY): Payer: Medicare Other

## 2014-09-09 LAB — RENAL FUNCTION PANEL
ANION GAP: 14 (ref 5–15)
Albumin: 3 g/dL — ABNORMAL LOW (ref 3.5–5.2)
BUN: 16 mg/dL (ref 6–23)
CHLORIDE: 110 meq/L (ref 96–112)
CO2: 18 mEq/L — ABNORMAL LOW (ref 19–32)
Calcium: 8.8 mg/dL (ref 8.4–10.5)
Creatinine, Ser: 1.45 mg/dL — ABNORMAL HIGH (ref 0.50–1.35)
GFR calc Af Amer: 56 mL/min — ABNORMAL LOW (ref >90)
GFR calc non Af Amer: 49 mL/min — ABNORMAL LOW (ref >90)
GLUCOSE: 109 mg/dL — AB (ref 70–99)
POTASSIUM: 3.6 meq/L — AB (ref 3.7–5.3)
Phosphorus: 3.7 mg/dL (ref 2.3–4.6)
Sodium: 142 mEq/L (ref 137–147)

## 2014-09-09 LAB — MAGNESIUM: MAGNESIUM: 2 mg/dL (ref 1.5–2.5)

## 2014-09-09 MED ORDER — LORAZEPAM 1 MG PO TABS
1.0000 mg | ORAL_TABLET | Freq: Four times a day (QID) | ORAL | Status: DC | PRN
  Administered 2014-09-09 – 2014-09-12 (×8): 1 mg via ORAL
  Filled 2014-09-09 (×8): qty 1

## 2014-09-09 MED ORDER — KCL IN DEXTROSE-NACL 40-5-0.9 MEQ/L-%-% IV SOLN
INTRAVENOUS | Status: DC
  Administered 2014-09-09: 12:00:00 via INTRAVENOUS
  Filled 2014-09-09 (×3): qty 1000

## 2014-09-09 NOTE — Progress Notes (Signed)
Physical Therapy Treatment Patient Details Name: Joshua Henson MRN: [id number] DOB: 1946-04-21 Today's Date: 09/09/2014    History of Present Illness 68 yo male admitted with ETOH withdrawal, falls. Hx of CVA, BPPV, Meniere's disease per pt, ETOH abuse, prostate cancer, HTN, HTN, non compliance    PT Comments    Progressing slowly with mobility. Balance is still poor, especially without use of walker. Worked on standing balance and strengthening on today. Pt states he plans on going home tomorrow. Feel pt could benefit from HHPT initially than transition back to outpatient PT.   Follow Up Recommendations  Home health PT;Supervision/Assistance - 24 hour     Equipment Recommendations  Rolling walker with 5" wheels    Recommendations for Other Services OT consult     Precautions / Restrictions Precautions Precautions: Fall Restrictions Weight Bearing Restrictions: No    Mobility  Bed Mobility Overal bed mobility: Needs Assistance Bed Mobility: Supine to Sit;Sit to Supine     Supine to sit: Supervision Sit to supine: Supervision      Transfers Overall transfer level: Needs assistance Equipment used: Rolling walker (2 wheeled) Transfers: Sit to/from Stand Sit to Stand: Min assist         General transfer comment: Multiple attempts to get to standing. VCS safety, hand placement  Ambulation/Gait Ambulation/Gait assistance: Min guard Ambulation Distance (Feet): 175 Feet Assistive device: Rolling walker (2 wheeled) Gait Pattern/deviations: Step-through pattern;Decreased stride length;Trunk flexed     General Gait Details: slow gait speed. tolerated fairly well. denied dizzines with head turns.    Stairs            Wheelchair Mobility    Modified Rankin (Stroke Patients Only)       Balance                                    Cognition Arousal/Alertness: Awake/alert Behavior During Therapy: WFL for tasks assessed/performed Overall  Cognitive Status: Within Functional Limits for tasks assessed                      Exercises General Exercises - Lower Extremity Hip Flexion/Marching: AROM;Both;10 reps;Standing Heel Raises: AROM;Both;15 reps;Standing Other Exercises Other Exercises: Standing, SLS with 10 sec hold x1 each leg, while holding onto RW    General Comments        Pertinent Vitals/Pain Pain Assessment: No/denies pain    Home Living                      Prior Function            PT Goals (current goals can now be found in the care plan section) Progress towards PT goals: Progressing toward goals    Frequency  Min 3X/week    PT Plan Current plan remains appropriate    Co-evaluation             End of Session Equipment Utilized During Treatment: Gait belt Activity Tolerance: Patient tolerated treatment well Patient left: in bed;with call bell/phone within reach;with chair alarm set     Time: 1443-1540 PT Time Calculation (min): 24 min  Charges:  $Gait Training: 8-22 mins $Neuromuscular Re-education: 8-22 mins                    G Codes:      Weston Anna, MPT Pager: [phone]

## 2014-09-09 NOTE — Progress Notes (Signed)
[facility] PROGRESS NOTE   Joshua VANDENBERGHE ZDG:[id number] DOB: 25-Jul-1946 DOA: 09/02/2014 PCP: Adella Hare, MD  HPI/Subjective: Stool positive for C. difficile, broad-spectrum antibiotics discontinued. Started patient on Flagyl. Placed back on IV fluids creatinine ranged from 0.6 1.4.  Interim history: 68 year old male with past medical history of Mnire disease came in to the hospital after he fell, was found to be alcohol intoxicated. Patient Initially admitted to step down and stayed there for some time because of significant withdrawal symptoms, patient even was on Precedex briefly. He did have fever while he was in the ICU, PCCM started him on Unasyn, spikes fever and not so switched to Zosyn and vancomycin. He developed diarrhea afterwards and tested positive for C. difficile. All antibiotics discontinued and Flagyl was started on 9/21. His alcohol withdrawal symptoms resolved, he still has a lot of anxiety but improving. Still working with physical therapy probably will be for discharge in the morning, and if her renal function is better. Discharge and Flagyl for 10 days, please do not discharge on Ativan.   Assessment/Plan: Active Problems:   Syncope   Syncope and collapse   Scalp laceration   Alcohol abuse   Non-compliant behavior   Alcohol withdrawal   Acute respiratory failure   Aspiration pneumonia   Lethargy     Alcohol withdrawal/delirium tremens  -Patient started on CIWA protocol, patient drinks a pint of vodka per day. -He was recently in the hospital for stroke in July of 2015, also went through detoxification. -Continue to monitor and stepdown. -Placed on Precedex for brief period of time, off of it, continue CIWA protocol. -More awake, alert and conversant.  C. difficile colitis -Has diarrhea, his stool is positive for C. difficile by PCR. -Patient is on Zosyn and vancomycin for presumed pneumonia, broad-spectrum antibiotics discontinued. -Patient  started on Flagyl 500 mg orally every 8 hours, Florastor twice a day.  Acute kidney injury -Creatinine 0.6 at baseline, patient was on Indocin and Lasix, creatinine increased to 1.45. -Nephrotoxic medications discontinued, placed on IV fluids, check BMP in a.m. If better can be discharged.  Pneumonia, likely aspiration -Patient developed fever of 101.4, and tachypnea. -Chest x-ray showed right lower lobe infiltrates. -Started on Unasyn, add mucolytics, antitussives and oxygen as needed. -Developed fever while he is on Unasyn, this is switched to Zosyn and vancomycin after appropriate cultures obtained. -Antibiotics all discontinued because of stool positive for C. difficile.  Alcohol abuse  -Patient continues to drink significant amounts of alcohol, alcohol level on admission was 313. -Continue multivitamins, folate and thiamine. -Patient has delirium tremens, thiamine level was checked on admission still pending (7 days).  Transaminitis  -Most likely secondary to alcohol abuse continue to monitor. 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  -2-D echocardiogram from 07/02/2014 showed normal LVEF. -Syncope likely secondary to alcohol intoxication plus Mnire's disease.  Depression  -Will continue Remeron at current dose as abrupt cessation may cause suicidal thoughts, seizures etc.   Scalp laceration  -Stable   Mnire's disease?  -Unsure patient has received a diagnosis of this disease process. Would check with patient's family to determine if and when patient was seen by a neurologist who diagnosed with Mnire's. 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  Right elbow pain  -Patient also has bilateral great toe base pain, denies any history of gout. -Patient started  on Indocin, but with rising creatinine  discontinued.  Hypokalemia -Hypokalemia with mild acidosis, along with hypomagnesemia and low phosphorus. -This is likely secondary to alcohol and Kaliuresis, patient also getting IV fluids without potassium.  Code Status: Full code Family Communication: Plan discussed with the patient. Disposition Plan: Remains inpatient, transfer to telemetry   Consultants:  PCCM  Procedures:  None  Antibiotics:  None   Objective: Filed Vitals:   09/09/14 0513  BP: 132/83  Pulse: 88  Temp: 98.1 F (36.7 C)  Resp: 20    Intake/Output Summary (Last 24 hours) at 09/09/14 1352 Last data filed at 09/09/14 0600  Gross per 24 hour  Intake 603.17 ml  Output   1725 ml  Net -1121.83 ml   Filed Weights   09/05/14 0013 09/07/14 0521 09/08/14 0217  Weight: 97.3 kg (214 lb 8.1 oz) 93.7 kg (206 lb 9.1 oz) 94.7 kg (208 lb 12.4 oz)    Exam: General: Alert and awake, oriented x3, not in any acute distress. HEENT: anicteric sclera, pupils reactive to light and accommodation, EOMI CVS: S1-S2 clear, no murmur rubs or gallops Chest: clear to auscultation bilaterally, no wheezing, rales or rhonchi Abdomen: soft nontender, nondistended, normal bowel sounds, no organomegaly Extremities: no cyanosis, clubbing or edema noted bilaterally Neuro: Cranial nerves II-XII intact, no focal neurological deficits  Data Reviewed: Basic Metabolic Panel:  Recent Labs Lab 09/03/14 0219  09/05/14 0335 09/06/14 0345 09/07/14 0333 09/08/14 0340 09/09/14 0415  NA  --   < > 137 139 139 139 142  K  --   < > 3.6* 3.1* 3.2* 3.5* 3.6*  CL  --   < > 101 106 105 107 110  CO2  --   < > 19 18* 21 19 18*  GLUCOSE  --   < > 122* 129* 118* 114* 109*  BUN  --   < > 7 10 6 6 16   CREATININE  --   < > 0.68 0.73 0.66 0.68 1.45*  CALCIUM  --   < > 9.0 8.0* 7.9* 8.3* 8.8  MG  --   < > 1.2* 1.4* 1.6 1.7 2.0  PHOS 2.8  --   --  1.4* 2.0* 2.4 3.7  < > = values in this interval not displayed. Liver Function  Tests:  Recent Labs Lab 09/02/14 2316 09/03/14 0520 09/04/14 0400 09/05/14 0335 09/07/14 0333 09/08/14 0340 09/09/14 0415  AST 192* 147* 78* 68* 46*  --   --   ALT 202* 154* 96* 78* 48  --   --   ALKPHOS 95 79 68 73 71  --   --   BILITOT 0.9 0.8 1.1 1.3* 1.0  --   --   PROT 9.0* 7.5 6.6 7.3 6.4  --   --   ALBUMIN 4.8 4.0 3.4* 3.7 2.8* 2.8* 3.0*   No results found for this basename: LIPASE, AMYLASE,  in the last 168 hours No results found for this basename: AMMONIA,  in the last 168 hours CBC:  Recent Labs Lab 09/02/14 2316 09/03/14 0520 09/04/14 0400 09/05/14 0335 09/06/14 0345 09/08/14 0340  WBC 6.8 6.0 6.7 9.2 9.6 8.0  NEUTROABS 4.0  --   --   --   --   --   HGB 16.7 14.6 12.9* 14.8 12.7* 12.2*  HCT 46.8 41.7 36.5* 40.5 36.1* 34.1*  MCV 99.8 100.0 100.8* 98.8 100.3* 98.3  PLT 229 202 186 198 182 223   Cardiac Enzymes:  Recent Labs Lab 09/03/14  0219 09/03/14 0520 09/03/14 1327  TROPONINI <0.30 <0.30 <0.30   BNP (last 3 results)  Recent Labs  09/03/14 0520  PROBNP 12.6   CBG: No results found for this basename: GLUCAP,  in the last 168 hours  Micro Recent Results (from the past 240 hour(s))  MRSA PCR SCREENING     Status: None   Collection Time    09/03/14  4:52 AM      Result Value Ref Range Status   MRSA by PCR NEGATIVE  NEGATIVE Final   Comment:            The GeneXpert MRSA Assay (FDA     approved for NASAL specimens     only), is one component of a     comprehensive MRSA colonization     surveillance program. It is not     intended to diagnose MRSA     infection nor to guide or     monitor treatment for     MRSA infections.  CULTURE, RESPIRATORY (NON-EXPECTORATED)     Status: None   Collection Time    09/05/14 12:58 PM      Result Value Ref Range Status   Specimen Description SPUTUM   Final   Special Requests NONE   Final   Gram Stain     Final   Value: FEW WBC PRESENT,BOTH PMN AND MONONUCLEAR     RARE SQUAMOUS EPITHELIAL CELLS  PRESENT     ABUNDANT GRAM POSITIVE COCCI     IN PAIRS IN CLUSTERS MODERATE GRAM POSITIVE RODS     Performed at [facility]   Culture     Final   Value: NORMAL OROPHARYNGEAL FLORA     Performed at [facility]   Report Status 09/07/2014 FINAL   Final  CULTURE, BLOOD (ROUTINE X 2)     Status: None   Collection Time    09/07/14  1:44 PM      Result Value Ref Range Status   Specimen Description BLOOD RIGHT ARM   Final   Special Requests BOTTLES DRAWN AEROBIC ONLY 4CC BLUE BOTTLES    Final   Culture  Setup Time     Final   Value: 09/07/2014 18:13     Performed at [facility]   Culture     Final   Value:        BLOOD CULTURE RECEIVED NO GROWTH TO DATE CULTURE WILL BE HELD FOR 5 DAYS BEFORE ISSUING A FINAL NEGATIVE REPORT     Performed at [facility]   Report Status PENDING   Incomplete  CULTURE, BLOOD (ROUTINE X 2)     Status: None   Collection Time    09/07/14  1:48 PM      Result Value Ref Range Status   Specimen Description BLOOD RIGHT ARM   Final   Special Requests     Final   Value: BOTTLES DRAWN AEROBIC AND ANAEROBIC 10CC BOTH BOTTLES   Culture  Setup Time     Final   Value: 09/07/2014 18:17     Performed at [facility]   Culture     Final   Value:        BLOOD CULTURE RECEIVED NO GROWTH TO DATE CULTURE WILL BE HELD FOR 5 DAYS BEFORE ISSUING A FINAL NEGATIVE REPORT     Note: Culture results may be compromised due to an excessive volume of blood received in culture bottles.     Performed at [facility]   Report Status PENDING   Incomplete  CLOSTRIDIUM DIFFICILE BY PCR     Status: Abnormal   Collection Time    09/08/14  1:04 PM      Result Value Ref Range Status   C difficile by pcr POSITIVE (*) NEGATIVE Final   Comment: CRITICAL RESULT CALLED TO, READ BACK BY AND VERIFIED WITH:     J. THIGPEN RN 15:50 09/08/14 ([email or web address])     Performed at [facility]     Studies: Dg Chest Port 1 View  09/09/2014   CLINICAL DATA:   Pneumonia.  EXAM: PORTABLE CHEST - 1 VIEW  COMPARISON:  09/07/2014.  07/01/2014.  FINDINGS: Mediastinum and hilar structures are normal. Stable cardiomegaly. Normal pulmonary vascularity. Persistent left base subsegmental atelectasis. No pleural effusion or pneumothorax. No acute osseus abnormality. Carotid vascular disease.  IMPRESSION: 1. Persistent left base subsegmental atelectasis. 2. Stable cardiomegaly, no CHF. 3. Carotid vascular disease 2   Electronically Signed   By: [place]   On: 09/09/2014 07:39    Scheduled Meds: . antiseptic oral rinse  7 mL Mouth Rinse BID  . atorvastatin  10 mg Oral Daily  . fluticasone  1 spray Each Nare Daily  . folic acid  1 mg Oral Daily  . loratadine  10 mg Oral Daily  . metroNIDAZOLE  500 mg Oral 3 times per day  . mirtazapine  7.5 mg Oral QHS  . multivitamin with minerals  1 tablet Oral Daily  . phosphorus  500 mg Oral BID  . saccharomyces boulardii  250 mg Oral BID  . thiamine  100 mg Oral Daily   Continuous Infusions: . dextrose 5 % and 0.9 % NaCl with KCl 40 mEq/L 75 mL/hr at 09/09/14 1229       Time spent: 35 minutes    [facility]  [facility] Pager [phone] If 7PM-7AM, please contact night-coverage at [email or web address], password [facility] 09/09/2014, 1:52 PM  LOS: 7 days

## 2014-09-10 LAB — RENAL FUNCTION PANEL
ALBUMIN: 2.9 g/dL — AB (ref 3.5–5.2)
ANION GAP: 14 (ref 5–15)
BUN: 29 mg/dL — ABNORMAL HIGH (ref 6–23)
CHLORIDE: 109 meq/L (ref 96–112)
CO2: 19 meq/L (ref 19–32)
Calcium: 8.8 mg/dL (ref 8.4–10.5)
Creatinine, Ser: 3.02 mg/dL — ABNORMAL HIGH (ref 0.50–1.35)
GFR, EST AFRICAN AMERICAN: 23 mL/min — AB (ref >90)
GFR, EST NON AFRICAN AMERICAN: 20 mL/min — AB (ref >90)
Glucose, Bld: 117 mg/dL — ABNORMAL HIGH (ref 70–99)
POTASSIUM: 4 meq/L (ref 3.7–5.3)
Phosphorus: 4.3 mg/dL (ref 2.3–4.6)
SODIUM: 142 meq/L (ref 137–147)

## 2014-09-10 LAB — VITAMIN B1: Vitamin B1 (Thiamine): 620 nmol/L — ABNORMAL HIGH (ref 8–30)

## 2014-09-10 MED ORDER — SODIUM CHLORIDE 0.9 % IV SOLN
INTRAVENOUS | Status: DC
  Administered 2014-09-10 – 2014-09-11 (×3): via INTRAVENOUS
  Administered 2014-09-11: 1000 mL via INTRAVENOUS
  Administered 2014-09-12: 04:00:00 via INTRAVENOUS

## 2014-09-10 MED ORDER — LORAZEPAM 1 MG PO TABS
2.0000 mg | ORAL_TABLET | Freq: Once | ORAL | Status: AC
  Administered 2014-09-10: 2 mg via ORAL
  Filled 2014-09-10: qty 2

## 2014-09-10 NOTE — Progress Notes (Signed)
Physical Therapy Treatment Patient Details Name: Joshua Henson MRN: [id number] DOB: [birth year] Today's Date: 09/10/2014    History of Present Illness 68 yo male admitted with ETOH withdrawal, falls. Hx of CVA, BPPV, Meniere's disease per pt, ETOH abuse, prostate cancer, HTN, HTN, non compliance    PT Comments    Progressing slowly with balance. LOB x 5 in posterior direction with standing balance exercises. Discussed ST rehab at SNF-pt prefers to d/c home. Will likely need 24 hour supervision/assist until safety improves. Could benefit from SNF if pt would agree.   Follow Up Recommendations  Home health PT;Supervision/Assistance - 24 hour (discussed SNF but pt states he prefers to d/c home)     Equipment Recommendations  Rolling walker with 5" wheels    Recommendations for Other Services OT consult     Precautions / Restrictions Precautions Precautions: Fall Restrictions Weight Bearing Restrictions: No    Mobility  Bed Mobility               General bed mobility comments: pt sitting in chair with nurse tech in room  Transfers Overall transfer level: Needs assistance Equipment used: Rolling walker (2 wheeled) Transfers: Sit to/from Stand Sit to Stand: Min assist         General transfer comment: VCS safety, hand placement. Assist to stabilize. Weight shifted posteriorly with initial standing.  Ambulation/Gait Ambulation/Gait assistance: Min guard Ambulation Distance (Feet): 400 Feet Assistive device: Rolling walker (2 wheeled) Gait Pattern/deviations: Step-through pattern     General Gait Details: slow gait speed. tolerated fairly well. denied dizzines with head turns.    Stairs            Wheelchair Mobility    Modified Rankin (Stroke Patients Only)       Balance           Standing balance support: Bilateral upper extremity supported;During functional activity Standing balance-Leahy Scale: Poor                      Cognition  Arousal/Alertness: Awake/alert Behavior During Therapy: WFL for tasks assessed/performed Overall Cognitive Status: Within Functional Limits for tasks assessed                      Exercises General Exercises - Lower Extremity Hip Flexion/Marching: AROM;Both;10 reps;Standing Heel Raises: AROM;Both;15 reps;Standing Other Exercises Other Exercises: Standing, SLS with 10 sec hold x1 each leg, while holding onto RW with one hand    General Comments        Pertinent Vitals/Pain Pain Assessment: No/denies pain    Home Living                      Prior Function            PT Goals (current goals can now be found in the care plan section) Progress towards PT goals: Progressing toward goals    Frequency  Min 3X/week    PT Plan Current plan remains appropriate    Co-evaluation             End of Session Equipment Utilized During Treatment: Gait belt Activity Tolerance: Patient limited by fatigue Patient left: in bed;with call bell/phone within reach;with bed alarm set     Time: 6144-3154 PT Time Calculation (min): 26 min  Charges:  $Gait Training: 8-22 mins $Neuromuscular Re-education: 8-22 mins  G Codes:      Weston Anna, MPT Pager: [phone]

## 2014-09-10 NOTE — Progress Notes (Signed)
CARE MANAGEMENT NOTE 09/10/2014  Patient:  Joshua Henson, Joshua Henson   Account Number:  [id number]  Date Initiated:  09/08/2014  Documentation initiated by:  DAVIS,RHONDA  Subjective/Objective Assessment:   patient admitted due to etoh problems and w/d     Action/Plan:   tbd was in rehab for detox approx. one week ac admission but rebounded and started drinking heavily.   Anticipated DC Date:  09/11/2014   Anticipated DC Plan:  [place] referral  Clinical Social Worker      DC Planning Services  CM consult      [facility] Choice  NA   Choice offered to / List presented to:  C-1 Patient   DME arranged  NA      DME agency  NA     [place] arranged  NA      [place] agency  NA   Status of service:  In process, will continue to follow Medicare Important Message given?  YES (If response is "NO", the following Medicare IM given date fields will be blank) Date Medicare IM given:  09/09/2014 Medicare IM given by:  [facility] Date Additional Medicare IM given:   Additional Medicare IM given by:    Discharge Disposition:    Per UR Regulation:  Reviewed for med. necessity/level of care/duration of stay  If discussed at [place] of Stay Meetings, dates discussed:   09/09/2014    Comments:  09/10/14 Kynley Metzger RN,BSN NCM [phone] [place].TC KRISTEN AWARE OF REFERRAL.AWAIT FINAL HHPT ORDER,FACE TO FACE.  09/09/14 Humaira Sculley RN,BSN NCM [phone] TRANSFER FROM SDU.PT-HH.  [id number] Davis,RN,BSN,CCM

## 2014-09-10 NOTE — Progress Notes (Signed)
PROGRESS NOTE  Joshua Henson:[id number] DOB: 1946-09-21 DOA: 09/02/2014 PCP: Adella Hare, MD  HPI: 68 year old male with past medical history of Mnire disease came in to the hospital after he fell, was found to be alcohol intoxicated. Patient Initially admitted to step down and stayed there for some time because of significant withdrawal symptoms, patient even was on Precedex briefly. He did have fever while he was in the ICU, PCCM started him on Unasyn, spikes fever and not so switched to Zosyn and vancomycin. He developed diarrhea afterwards and tested positive for C. difficile. All antibiotics discontinued and Flagyl was started on 9/21. His alcohol withdrawal symptoms resolved, he still has a lot of anxiety but improving.   Subjective/ 24 H Interval events - very angry this morning with everything, wants to leave AMA  Assessment/Plan: Alcohol withdrawal/delirium tremens  - Patient started on CIWA protocol on admission, patient drinks a pint of vodka per day.  - He was recently in the hospital for stroke in July of 2015, also went through detoxification.  - Placed on Precedex for brief period of time, off of it, continue CIWA protocol.  C. difficile colitis  -Has diarrhea, his stool is positive for C. difficile by PCR.  -Patient was on Zosyn and vancomycin for presumed pneumonia, broad-spectrum antibiotics discontinued.  -Patient started on Flagyl 500 mg orally every 8 hours, Florastor twice a day.  Acute kidney injury  - Creatinine 0.6 at baseline, patient was on Indocin and Lasix, creatinine increased to 1.45 and is now 3. Indocin and Lasix now discontinued. He has good urine output.   - Nephrotoxic medications discontinued, placed on IV fluids, check BMP in a.m.  Pneumonia, likely aspiration  - Patient developed fever of 101.4, and tachypnea.  - Chest x-ray showed right lower lobe infiltrates.  - Initially on Unasyn, add mucolytics, antitussives and oxygen as needed, then  because he was febrile he was broadened to Zosyn and vancomycin, which were discontinued on 9/21 because of stool positive for C. difficile.  Alcohol abuse  - Patient continues to drink significant amounts of alcohol, alcohol level on admission was 313.  - Continue multivitamins, folate and thiamine.  - Patient has delirium tremens, thiamine level was checked on admission still pending (7 days).  Transaminitis  - Most likely secondary to alcohol abuse continue to monitor.  - Screening for hepatitis C is negative  Syncope with collapse  - Patient believes his syncope/collapse secondary to Mnire's disease.  - 2-D echocardiogram from 07/02/2014 showed normal LVEF.  - Syncope likely secondary to alcohol intoxication plus Mnire's disease.  Depression  - Will continue Remeron at current dose as abrupt cessation may cause suicidal thoughts, seizures etc.  Scalp laceration  - Stable  Mnire's disease?  - Unsure patient has received a diagnosis of this disease process. Would check with patient's family to determine if and when patient was seen by a neurologist who diagnosed with Mnire's.  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  Right elbow pain  - Patient also has bilateral great toe base pain, denies any history of gout.  - Patient started on Indocin, but with rising creatinine discontinued.  Hypokalemia  - Hypokalemia with mild acidosis, along with hypomagnesemia and low phosphorus.  - resolved, stop IV K especially with renal failure  Diet: heart Fluids: NS DVT Prophylaxis: SCDs  Code Status: Full Family Communication: d/w patient, girlfriend, exwife  Disposition Plan: inpatient  Consultants:  Psychiatry   Procedures:  None    Antibiotics Unasyn 9/18 >> 9/20 Vancomycin 9/20>>9/21 Zosyn 9/20>>9/21 Flagyl 9/21 >>  Studies  Filed Vitals:   09/09/14 1500 09/10/14 0010 09/10/14 0640 09/10/14 1208  BP:  154/88 157/91 156/93 152/97  Pulse: 83 70 72 87  Temp: 98.2 F (36.8 C) 99 F (37.2 C) 98.2 F (36.8 C) 98.4 F (36.9 C)  TempSrc: Oral Oral Oral Oral  Resp: 20  20 18   Height:      Weight:      SpO2: 98% 96% 96% 97%    Intake/Output Summary (Last 24 hours) at 09/10/14 1405 Last data filed at 09/10/14 1120  Gross per 24 hour  Intake 1807.5 ml  Output   1000 ml  Net  807.5 ml   Filed Weights   09/05/14 0013 09/07/14 0521 09/08/14 0217  Weight: 97.3 kg (214 lb 8.1 oz) 93.7 kg (206 lb 9.1 oz) 94.7 kg (208 lb 12.4 oz)    Exam:  General:  NAD, tremulous, anxious  Cardiovascular: RRR  Respiratory: CTA biL  Abdomen: soft, non tender  MSK: no edema  Data Reviewed: Basic Metabolic Panel:  Recent Labs Lab 09/05/14 0335 09/06/14 0345 09/07/14 0333 09/08/14 0340 09/09/14 0415 09/10/14 0407  NA 137 139 139 139 142 142  K 3.6* 3.1* 3.2* 3.5* 3.6* 4.0  CL 101 106 105 107 110 109  CO2 19 18* 21 19 18* 19  GLUCOSE 122* 129* 118* 114* 109* 117*  BUN 7 10 6 6 16  29*  CREATININE 0.68 0.73 0.66 0.68 1.45* 3.02*  CALCIUM 9.0 8.0* 7.9* 8.3* 8.8 8.8  MG 1.2* 1.4* 1.6 1.7 2.0  --   PHOS  --  1.4* 2.0* 2.4 3.7 4.3   Liver Function Tests:  Recent Labs Lab 09/04/14 0400 09/05/14 0335 09/07/14 0333 09/08/14 0340 09/09/14 0415 09/10/14 0407  AST 78* 68* 46*  --   --   --   ALT 96* 78* 48  --   --   --   ALKPHOS 68 73 71  --   --   --   BILITOT 1.1 1.3* 1.0  --   --   --   PROT 6.6 7.3 6.4  --   --   --   ALBUMIN 3.4* 3.7 2.8* 2.8* 3.0* 2.9*   CBC:  Recent Labs Lab 09/04/14 0400 09/05/14 0335 09/06/14 0345 09/08/14 0340  WBC 6.7 9.2 9.6 8.0  HGB 12.9* 14.8 12.7* 12.2*  HCT 36.5* 40.5 36.1* 34.1*  MCV 100.8* 98.8 100.3* 98.3  PLT 186 198 182 223   BNP (last 3 results)  Recent Labs  09/03/14 0520  PROBNP 12.6   Recent Results (from the past 240 hour(s))  MRSA PCR SCREENING     Status: None   Collection Time    09/03/14  4:52 AM      Result Value  Ref Range Status   MRSA by PCR NEGATIVE  NEGATIVE Final   Comment:            The GeneXpert MRSA Assay (FDA     approved for NASAL specimens     only), is one component of a     comprehensive MRSA colonization     surveillance program. It is not     intended to diagnose MRSA     infection nor to guide or     monitor treatment for     MRSA infections.  CULTURE, RESPIRATORY (NON-EXPECTORATED)  Status: None   Collection Time    09/05/14 12:58 PM      Result Value Ref Range Status   Specimen Description SPUTUM   Final   Special Requests NONE   Final   Gram Stain     Final   Value: FEW WBC PRESENT,BOTH PMN AND MONONUCLEAR     RARE SQUAMOUS EPITHELIAL CELLS PRESENT     ABUNDANT GRAM POSITIVE COCCI     IN PAIRS IN CLUSTERS MODERATE GRAM POSITIVE RODS     Performed at [facility]   Culture     Final   Value: NORMAL OROPHARYNGEAL FLORA     Performed at [facility]   Report Status 09/07/2014 FINAL   Final  CULTURE, BLOOD (ROUTINE X 2)     Status: None   Collection Time    09/07/14  1:44 PM      Result Value Ref Range Status   Specimen Description BLOOD RIGHT ARM   Final   Special Requests BOTTLES DRAWN AEROBIC ONLY 4CC BLUE BOTTLES    Final   Culture  Setup Time     Final   Value: 09/07/2014 18:13     Performed at [facility]   Culture     Final   Value:        BLOOD CULTURE RECEIVED NO GROWTH TO DATE CULTURE WILL BE HELD FOR 5 DAYS BEFORE ISSUING A FINAL NEGATIVE REPORT     Performed at [facility]   Report Status PENDING   Incomplete  CULTURE, BLOOD (ROUTINE X 2)     Status: None   Collection Time    09/07/14  1:48 PM      Result Value Ref Range Status   Specimen Description BLOOD RIGHT ARM   Final   Special Requests     Final   Value: BOTTLES DRAWN AEROBIC AND ANAEROBIC 10CC BOTH BOTTLES   Culture  Setup Time     Final   Value: 09/07/2014 18:17     Performed at [facility]   Culture     Final   Value:        BLOOD CULTURE  RECEIVED NO GROWTH TO DATE CULTURE WILL BE HELD FOR 5 DAYS BEFORE ISSUING A FINAL NEGATIVE REPORT     Note: Culture results may be compromised due to an excessive volume of blood received in culture bottles.     Performed at [facility]   Report Status PENDING   Incomplete  CLOSTRIDIUM DIFFICILE BY PCR     Status: Abnormal   Collection Time    09/08/14  1:04 PM      Result Value Ref Range Status   C difficile by pcr POSITIVE (*) NEGATIVE Final   Comment: CRITICAL RESULT CALLED TO, READ BACK BY AND VERIFIED WITH:     J. THIGPEN RN 15:50 09/08/14 ([email or web address])     Performed at [facility]     Studies: Dg Chest Port 1 View  09/09/2014   CLINICAL DATA:  Pneumonia.  EXAM: PORTABLE CHEST - 1 VIEW  COMPARISON:  09/07/2014.  07/01/2014.  FINDINGS: Mediastinum and hilar structures are normal. Stable cardiomegaly. Normal pulmonary vascularity. Persistent left base subsegmental atelectasis. No pleural effusion or pneumothorax. No acute osseus abnormality. Carotid vascular disease.  IMPRESSION: 1. Persistent left base subsegmental atelectasis. 2. Stable cardiomegaly, no CHF. 3. Carotid vascular disease 2   Electronically Signed   By: [place]   On: 09/09/2014 07:39  Scheduled Meds: . antiseptic oral rinse  7 mL Mouth Rinse BID  . atorvastatin  10 mg Oral Daily  . fluticasone  1 spray Each Nare Daily  . folic acid  1 mg Oral Daily  . loratadine  10 mg Oral Daily  . metroNIDAZOLE  500 mg Oral 3 times per day  . mirtazapine  7.5 mg Oral QHS  . multivitamin with minerals  1 tablet Oral Daily  . saccharomyces boulardii  250 mg Oral BID  . thiamine  100 mg Oral Daily   Continuous Infusions: . sodium chloride 100 mL/hr at 09/10/14 0272    Active Problems:   Syncope   Syncope and collapse   Scalp laceration   Alcohol abuse   Non-compliant behavior   Alcohol withdrawal   Acute respiratory failure   Aspiration pneumonia   Lethargy   Time spent: [place],  MD [facility] Pager [phone]. If 7 PM - 7 AM, please contact night-coverage at [email or web address], password [facility] 09/10/2014, 2:05 PM  LOS: 8 days

## 2014-09-11 LAB — BASIC METABOLIC PANEL
Anion gap: 15 (ref 5–15)
BUN: 29 mg/dL — ABNORMAL HIGH (ref 6–23)
CALCIUM: 8.9 mg/dL (ref 8.4–10.5)
CO2: 16 mEq/L — ABNORMAL LOW (ref 19–32)
Chloride: 110 mEq/L (ref 96–112)
Creatinine, Ser: 2.92 mg/dL — ABNORMAL HIGH (ref 0.50–1.35)
GFR calc non Af Amer: 21 mL/min — ABNORMAL LOW (ref >90)
GFR, EST AFRICAN AMERICAN: 24 mL/min — AB (ref >90)
Glucose, Bld: 105 mg/dL — ABNORMAL HIGH (ref 70–99)
POTASSIUM: 3.7 meq/L (ref 3.7–5.3)
SODIUM: 141 meq/L (ref 137–147)

## 2014-09-11 MED ORDER — TRAZODONE HCL 50 MG PO TABS
50.0000 mg | ORAL_TABLET | Freq: Every day | ORAL | Status: DC
  Administered 2014-09-11: 50 mg via ORAL
  Filled 2014-09-11: qty 1

## 2014-09-11 NOTE — Consult Note (Signed)
[facility] Face-to-Face Psychiatry Consult   Reason for Consult:  Alcohol dependence Referring Physician:  Dr. Leata Mouse is an 68 y.o. male. Total Time spent with patient: 45 minutes  Assessment: AXIS I:  alcohol dependence AXIS II:  Deferred AXIS III:   Past Medical History  Diagnosis Date  . Prostate cancer 2010    External beam radiation (urol - Risa Grill, XRT Valere Dross)  . Stroke   . Depression    AXIS IV:  other psychosocial or environmental problems, problems related to social environment and problems with primary support group AXIS V:  51-60 moderate symptoms  Plan:  Case discussed with Dr. Cruzita Lederer No evidence of imminent risk to self or others at present.   Patient does not meet criteria for psychiatric inpatient admission. Supportive therapy provided about ongoing stressors. Refer to IOP.  Subjective:   Joshua Henson is a 68 y.o. male patient admitted with alcohol .  HPI:  Patient is seen, chart reviewed and case discussed with physician and psych social service. Patient stated that he has been drinking over 20 years and drinks one fifth of vodka daily and has Menire disease and asks me to speak loud. He also reports drinking alcohol does not help his condition but may worse. He has recent alcohol detox treatment at [facility] about three weeks ago and not interested their residential chemical dependency rehab treatment but willing to follow up with out patient substance abuse counseling. He has denied current symptoms of depression, anxiety and psychosis. He has denied suicidal or homicidal ideation, intention or plans. Patient has mild confusion, other wise has intact cognition including orientation, concentration, memory and language functions. 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 He has supportive family and he is willing to actively participate in out patient  care.   Medical history: Joshua Henson is a 68 y.o. WM PMHx depression, alcohol abuse, HX stroke, Hx prostate cancer, HTN, HLD, presents with falls in his home. He has friends with him who reports that they have visited him and he has not recalled them being there and he has also been falling very frequently. Apparently had fallen sometime today and they went over to his house and found him lying on the floor. The patient is a heavy drinker. He did go through detox had quit for about a week but now has rebounded and been drinking heavily for a number of days. The patient reports that he has some tremoring right now and he has had significant withdrawal in the past when he has tried to quit. The patient had been admitted about 2 months ago with a combination of a stroke and fall associated with alcoholism as well. The patient recognizes that he has very poor coordination and experiences frequent falls. 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   Review of Systems: The patient denies anorexia, fever, weight loss,, vision loss, hoarseness, chest pain, syncope, dyspnea on exertion, peripheral edema, hemoptysis, abdominal pain, melena, hematochezia, severe indigestion/heartburn, hematuria, incontinence, genital sores, muscle weakness, suspicious skin lesions, transient blindness, unusual weight change, abnormal bleeding, enlarged lymph nodes, angioedema, and breast masses  HPI Elements:   Location:  Alcohol dependenc. Quality:  recent falls due to relapse . Severity:  moderate and no current detox needs.  Timing:  work stress and chronic alcohol use.  Past Psychiatric History: Past Medical History  Diagnosis Date  . Prostate cancer 2010    External beam radiation (urol - Risa Grill, XRT Valere Dross)  . Stroke   . Depression     reports that he has been smoking Cigarettes.  He has a 30 pack-year smoking history. He has  never used smokeless tobacco. He reports that he drinks about 3.5 ounces of alcohol per week. He reports that he does not use illicit drugs. Family History  Problem Relation Age of Onset  . Cancer Mother     esophageal  . Hypertension Mother   . Diabetes Mother   . Cancer Father     prostate  . Heart disease Maternal Grandfather     MI  . Cancer Paternal Grandfather     prostate     Living Arrangements: Spouse/significant other (works but can be available per pt)   Abuse/Neglect [facility]) Physical Abuse: Denies Verbal Abuse: Denies Sexual Abuse: Denies Allergies:   Allergies  Allergen Reactions  . Celebrex [Celecoxib] Rash    ACT Assessment Complete:  NO Objective: Blood pressure 164/106, pulse 68, temperature 98.1 F (36.7 C), temperature source Oral, resp. rate 18, height 5' 10" (1.778 m), weight 94.7 kg (208 lb 12.4 oz), SpO2 93.00%.Body mass index is 29.96 kg/(m^2). Results for orders placed during the hospital encounter of 09/02/14 (from the past 72 hour(s))  RENAL FUNCTION PANEL     Status: Abnormal   Collection Time    09/09/14  4:15 AM      Result Value Ref Range   Sodium 142  137 - 147 mEq/L   Potassium 3.6 (*) 3.7 - 5.3 mEq/L   Chloride 110  96 - 112 mEq/L   CO2 18 (*) 19 - 32 mEq/L   Glucose, Bld 109 (*) 70 - 99 mg/dL   BUN 16  6 - 23 mg/dL   Creatinine, Ser 1.45 (*) 0.50 - 1.35 mg/dL   Comment: DELTA CHECK NOTED     REPEATED TO VERIFY   Calcium 8.8  8.4 - 10.5 mg/dL   Phosphorus 3.7  2.3 - 4.6 mg/dL   Albumin 3.0 (*) 3.5 - 5.2 g/dL   GFR calc non Af Amer 49 (*) >90 mL/min   GFR calc Af Amer 56 (*) >90 mL/min   Comment: (NOTE)     The eGFR has been calculated using the CKD EPI equation.     This calculation has not been validated in all clinical situations.     eGFR's persistently <90 mL/min signify possible Chronic Kidney     Disease.   Anion gap 14  5 - 15  MAGNESIUM     Status: None   Collection Time    09/09/14  4:15 AM      Result Value Ref Range    Magnesium 2.0  1.5 - 2.5 mg/dL  RENAL FUNCTION PANEL     Status: Abnormal   Collection Time    09/10/14  4:07 AM      Result Value Ref Range   Sodium 142  137 - 147 mEq/L   Potassium 4.0  3.7 - 5.3 mEq/L   Chloride 109  96 - 112 mEq/L   CO2 19  19 - 32 mEq/L   Glucose, Bld 117 (*) 70 - 99 mg/dL   BUN 29 (*) 6 - 23 mg/dL   Comment: DELTA CHECK NOTED     REPEATED TO VERIFY   Creatinine, Ser 3.02 (*)  0.50 - 1.35 mg/dL   Comment: DELTA CHECK NOTED     REPEATED TO VERIFY   Calcium 8.8  8.4 - 10.5 mg/dL   Phosphorus 4.3  2.3 - 4.6 mg/dL   Albumin 2.9 (*) 3.5 - 5.2 g/dL   GFR calc non Af Amer 20 (*) >90 mL/min   GFR calc Af Amer 23 (*) >90 mL/min   Comment: (NOTE)     The eGFR has been calculated using the CKD EPI equation.     This calculation has not been validated in all clinical situations.     eGFR's persistently <90 mL/min signify possible Chronic Kidney     Disease.   Anion gap 14  5 - 15  BASIC METABOLIC PANEL     Status: Abnormal   Collection Time    09/11/14  6:40 AM      Result Value Ref Range   Sodium 141  137 - 147 mEq/L   Potassium 3.7  3.7 - 5.3 mEq/L   Chloride 110  96 - 112 mEq/L   CO2 16 (*) 19 - 32 mEq/L   Glucose, Bld 105 (*) 70 - 99 mg/dL   BUN 29 (*) 6 - 23 mg/dL   Creatinine, Ser 2.92 (*) 0.50 - 1.35 mg/dL   Calcium 8.9  8.4 - 10.5 mg/dL   GFR calc non Af Amer 21 (*) >90 mL/min   GFR calc Af Amer 24 (*) >90 mL/min   Comment: (NOTE)     The eGFR has been calculated using the CKD EPI equation.     This calculation has not been validated in all clinical situations.     eGFR's persistently <90 mL/min signify possible Chronic Kidney     Disease.   Anion gap 15  5 - 15   Labs are reviewed.  Current Facility-Administered Medications  Medication Dose Route Frequency Provider Last Rate Last Dose  . 0.9 %  sodium chloride infusion   Intravenous Continuous Caren Griffins, MD 100 mL/hr at 09/11/14 0622    . antiseptic oral rinse (CPC / CETYLPYRIDINIUM  CHLORIDE 0.05%) solution 7 mL  7 mL Mouth Rinse BID Verlee Monte, MD   7 mL at 09/11/14 1145  . atorvastatin (LIPITOR) tablet 10 mg  10 mg Oral Daily Allie Bossier, MD   10 mg at 09/10/14 1626  . fluticasone (FLONASE) 50 MCG/ACT nasal spray 1 spray  1 spray Each Nare Daily Allie Bossier, MD   1 spray at 09/11/14 1141  . folic acid (FOLVITE) tablet 1 mg  1 mg Oral Daily Dara Hoyer, RPH   1 mg at 09/11/14 1141  . haloperidol lactate (HALDOL) injection 5 mg  5 mg Intravenous BID PRN Verlee Monte, MD   5 mg at 09/11/14 0003  . hydrALAZINE (APRESOLINE) injection 5 mg  5 mg Intravenous Q6H PRN Verlee Monte, MD   5 mg at 09/08/14 0559  . loratadine (CLARITIN) tablet 10 mg  10 mg Oral Daily Allie Bossier, MD   10 mg at 09/11/14 1141  . LORazepam (ATIVAN) tablet 1 mg  1 mg Oral Q6H PRN Caren Griffins, MD   1 mg at 09/11/14 1400  . metroNIDAZOLE (FLAGYL) tablet 500 mg  500 mg Oral 3 times per day Verlee Monte, MD   500 mg at 09/11/14 1400  . multivitamin with minerals tablet 1 tablet  1 tablet Oral Daily Gardiner Barefoot, NP   1 tablet at 09/11/14 1140  . saccharomyces boulardii (FLORASTOR)  capsule 250 mg  250 mg Oral BID Verlee Monte, MD   250 mg at 09/11/14 1140  . thiamine (VITAMIN B-1) tablet 100 mg  100 mg Oral Daily Charlesetta Shanks, MD   100 mg at 09/11/14 1141  . traZODone (DESYREL) tablet 50 mg  50 mg Oral QHS Costin Karlyne Greenspan, MD      . zolpidem (AMBIEN) tablet 5 mg  5 mg Oral QHS PRN Allie Bossier, MD   5 mg at 09/09/14 2301    Psychiatric Specialty Exam: Physical Exam as per history and physical  Review of Systems  Neurological: Positive for dizziness, tremors and weakness.  Psychiatric/Behavioral: Positive for substance abuse. The patient has insomnia.      Blood pressure 164/106, pulse 68, temperature 98.1 F (36.7 C), temperature source Oral, resp. rate 18, height 5' 10" (1.778 m), weight 94.7 kg (208 lb 12.4 oz), SpO2 93.00%.Body mass index is 29.96 kg/(m^2).  General  Appearance: Casual  Eye Contact::  Good  Speech:  Clear and Coherent  Volume:  Normal  Mood:  Euthymic  Affect:  Appropriate and Congruent  Thought Process:  Coherent and Goal Directed  Orientation:  Full (Time, Place, and Person)  Thought Content:  WDL  Suicidal Thoughts:  No  Homicidal Thoughts:  No  Memory:  Immediate;   Fair Recent;   Fair  Judgement:  Intact  Insight:  Good  Psychomotor Activity:  Decreased  Concentration:  Good  Recall:  Good  Fund of Knowledge:Good  Language: Good  Akathisia:  NA  Handed:  Right  AIMS (if indicated):     Assets:  Communication Skills Desire for Improvement Financial Resources/Insurance Housing Intimacy Leisure Time Resilience Social Support Talents/Skills Transportation Vocational/Educational  Sleep:      Musculoskeletal: Strength & Muscle Tone: decreased Gait & Station: unable to stand Patient leans: N/A  Treatment Plan Summary: Daily contact with patient to assess and evaluate symptoms and progress in treatment Medication management Recommend no medication changes and no detox needs any longer Provide referral to chemical dependency intensive out patient program either at [facility] or [facility]. 09/11/2014 3:38 PM

## 2014-09-11 NOTE — Progress Notes (Signed)
Clinical Social Work Department CLINICAL SOCIAL WORK PSYCHIATRY SERVICE LINE ASSESSMENT 09/11/2014  Patient:  Joshua Henson [facility]  Account:  [id number]  [place] Date:  09/02/2014  Clinical Social Worker:  Sindy Messing, LCSW  Date/Time:  09/11/2014 02:00 PM Referred by:  Physician  Date referred:  09/11/2014 Reason for Referral  Substance Abuse   Presenting Symptoms/Problems (In the person's/family's own words):   Psych consulted due to substance use.   Abuse/Neglect/Trauma History (check all that apply)  Denies history   Abuse/Neglect/Trauma Comments:   Psychiatric History (check all that apply)  Denies history   Psychiatric medications:  Trazodone 50 mg   Current Mental Health Hospitalizations/Previous Mental Health History:   Patient denies any previous MH diagnosis. Patient denies any outpatient follow up.   Current provider:   None   Place and Date:   N/A   Current Medications:   Scheduled Meds:      . antiseptic oral rinse  7 mL Mouth Rinse BID  . atorvastatin  10 mg Oral Daily  . fluticasone  1 spray Each Nare Daily  . folic acid  1 mg Oral Daily  . loratadine  10 mg Oral Daily  . metroNIDAZOLE  500 mg Oral 3 times per day  . multivitamin with minerals  1 tablet Oral Daily  . saccharomyces boulardii  250 mg Oral BID  . thiamine  100 mg Oral Daily  . traZODone  50 mg Oral QHS        Continuous Infusions:      . sodium chloride 100 mL/hr at 09/11/14 0622          PRN Meds:.haloperidol lactate, hydrALAZINE, LORazepam, zolpidem       Previous Impatient Admission/Date/Reason:   None reported   Emotional Health / Current Symptoms    Suicide/Self Harm  None reported   Suicide attempt in the past:   Patient denies any SI or HI.   Other harmful behavior:   None reported   Psychotic/Dissociative Symptoms  None reported   Other Psychotic/Dissociative Symptoms:    Attention/Behavioral Symptoms  Within Normal Limits   Other Attention / Behavioral Symptoms:    Patient engaged during assessment.    Cognitive Impairment  Within Normal Limits   Other Cognitive Impairment:   Patient alert and oriented.    Mood and Adjustment  Flat    Stress, Anxiety, Trauma, Any Recent Loss/Stressor  None reported   Anxiety (frequency):   N/A   Phobia (specify):   N/A   Compulsive behavior (specify):   N/A   Obsessive behavior (specify):   N/A   Other:   N/A   Substance Abuse/Use  Current substance use   SBIRT completed (please refer for detailed history):  Y  Self-reported substance use:   Patient reports he started drinking in college. Patient started drinking on a daily basis about 20 years ago. Patient is currently drinking about 1 pint of vodka daily. Patient denies any other substance use.   Urinary Drug Screen Completed:  Y Alcohol level:   313    Environmental/Housing/Living Arrangement  Stable housing   Who is in the home:   Girlfriend   Emergency contact:  Michelle-girlfriend   Financial  Medicare   Patient's Strengths and Goals (patient's own words):   Patient reports strong support system.   Clinical Social Worker's Interpretive Summary:   CSW received referral to complete psychosocial assessment. CSW reviewed chart and met with patient at bedside. CSW introduced myself and explained role.    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 Patient reports that he lives a stressful life due to his job. 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 Patient reports when he would drink alcohol then this problem would get worse.    Patient open to discussing substance use and reports he started drinking after work with friends but then started to continue to drink when he got home from work. Patient reports that he has been drinking on a daily basis for the past 20 years. Patient  reports that he knows he needs to reduce his drinking habits because it is negatively affecting his physical wellbeing. Patient reports in the past two months he has tried to reduce his consumption but has not been successful. Patient denies any formal treatment but feels he can return home and not drink as often.    Patient engaged in assessment but does not feel he needs any assistance. Patient reports he is hopeful to DC soon. Patient reports he will return home with girlfriend who will assist him as needed.   Disposition:  Recommend Psych CSW continuing to support while in hospital    , LCSW [phone]  

## 2014-09-11 NOTE — Progress Notes (Signed)
Physical Therapy Treatment Patient Details Name: CONNER MUEGGE MRN: [id number] DOB: Sep 19, 1946 Today's Date: 09/11/2014    History of Present Illness 68 yo male admitted with ETOH withdrawal, falls. Hx of CVA, BPPV, Meniere's disease per pt, ETOH abuse, prostate cancer, HTN, HTN, non compliance    PT Comments    Progressing slowly with mobility.   Follow Up Recommendations  Home health PT;Supervision/Assistance - 24 hour     Equipment Recommendations  Rolling walker with 5" wheels    Recommendations for Other Services OT consult     Precautions / Restrictions Precautions Precautions: Fall Restrictions Weight Bearing Restrictions: No    Mobility  Bed Mobility               General bed mobility comments: pt sitting EOB  Transfers Overall transfer level: Needs assistance Equipment used: Rolling walker (2 wheeled)   Sit to Stand: Min assist         General transfer comment: VCS safety, hand placement. Assist to stabilize. Weight shifted posteriorly with initial standing.  Ambulation/Gait Ambulation/Gait assistance: Min guard;Min assist Ambulation Distance (Feet): 400 Feet (x2 (once with IV pole, once with RW)) Assistive device: Rolling walker (2 wheeled) Gait Pattern/deviations: Step-through pattern;Decreased stride length     General Gait Details: slow gait speed. tolerated fairly well. denied dizzines with head turns. Walked once with IV pole to challenge balance and then once more with RW. Pt much steadier with walker   Stairs            Wheelchair Mobility    Modified Rankin (Stroke Patients Only)       Balance                                    Cognition Arousal/Alertness: Awake/alert Behavior During Therapy: WFL for tasks assessed/performed Overall Cognitive Status: Within Functional Limits for tasks assessed                      Exercises      General Comments        Pertinent Vitals/Pain Pain  Assessment: 0-10    Home Living                      Prior Function            PT Goals (current goals can now be found in the care plan section) Progress towards PT goals: Progressing toward goals    Frequency  Min 3X/week    PT Plan Current plan remains appropriate    Co-evaluation             End of Session Equipment Utilized During Treatment: Gait belt Activity Tolerance: Patient tolerated treatment well Patient left: in bed;with call bell/phone within reach     Time: 1207-1225 PT Time Calculation (min): 18 min  Charges:  $Gait Training: 8-22 mins                    G Codes:      Weston Anna, MPT Pager: [phone]

## 2014-09-11 NOTE — Progress Notes (Signed)
PROGRESS NOTE  Joshua Henson:[id number] DOB: [birth year] DOA: 09/02/2014 PCP: Adella Hare, MD  HPI: 68 year old male with past medical history of Mnire disease came in to the hospital after he fell, was found to be alcohol intoxicated. Patient Initially admitted to step down and stayed there for some time because of significant withdrawal symptoms, patient even was on Precedex briefly. He did have fever while he was in the ICU, PCCM started him on Unasyn, spikes fever and not so switched to Zosyn and vancomycin. He developed diarrhea afterwards and tested positive for C. difficile. All antibiotics discontinued and Flagyl was started on 9/21. His alcohol withdrawal symptoms resolved, he still has a lot of anxiety but improving.   Subjective/ 24 H Interval events - very angry this morning with everything, wants to leave AMA  Assessment/Plan: Alcohol withdrawal/delirium tremens  - Patient started on CIWA protocol on admission, patient drinks a pint of vodka per day.  - He was recently in the hospital for stroke in July of 2015, also went through detoxification.  - Placed on Precedex for brief period of time, off of it,  - withdrawal improved now C. difficile colitis  -Has diarrhea, his stool is positive for C. difficile by PCR.  -Patient was on Zosyn and vancomycin for presumed pneumonia, broad-spectrum antibiotics discontinued.  -Patient started on Flagyl 500 mg orally every 8 hours, Florastor twice a day.  - still with loose BMs last night Acute kidney injury  - Creatinine 0.6 at baseline, patient was on Indocin and Lasix, creatinine increased to 1.45 and is now 3. Indocin and Lasix now discontinued. He has good urine output.   - Nephrotoxic medications discontinued, placed on IV fluids - Cr improved slightly today, continue IV hydration Pneumonia, likely aspiration  - Patient developed fever of 101.4, and tachypnea.  - Chest x-ray showed right lower lobe infiltrates.  -  Initially on Unasyn, add mucolytics, antitussives and oxygen as needed, then because he was febrile he was broadened to Zosyn and vancomycin, which were discontinued on 9/21 because of stool positive for C. difficile.  - stable on room air Alcohol abuse  - Patient continues to drink significant amounts of alcohol, alcohol level on admission was 313.  - Continue multivitamins, folate and thiamine.  - Patient has delirium tremens, thiamine level was checked on admission still pending (7 days).  Transaminitis  - Most likely secondary to alcohol abuse continue to monitor.  - Screening for hepatitis C is negative  Syncope with collapse  - Patient believes his syncope/collapse secondary to Mnire's disease.  - 2-D echocardiogram from 07/02/2014 showed normal LVEF.  - Syncope likely secondary to alcohol intoxication plus Mnire's disease.  Depression  - Will continue Remeron at current dose as abrupt cessation may cause suicidal thoughts, seizures etc.  Scalp laceration  - Stable  Mnire's disease?  - Unsure patient has received a diagnosis of this disease process. Would check with patient's family to determine if and when patient was seen by a neurologist who diagnosed with Mnire's.  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  Right elbow pain  - Patient also has bilateral great toe base pain, denies any history of gout.  - Patient started on Indocin, but with rising creatinine discontinued.  Hypokalemia  - Hypokalemia with mild acidosis, along with hypomagnesemia and low phosphorus.  - resolved, stop IV K especially with renal failure  Diet: heart  Fluids: NS DVT Prophylaxis: SCDs  Code Status: Full Family Communication: d/w patient and girlfriend Disposition Plan: inpatient  Consultants:  Psychiatry   Procedures:  None    Antibiotics Unasyn 9/18 >> 9/20 Vancomycin 9/20>>9/21 Zosyn 9/20>>9/21 Flagyl 9/21  >>  Studies  Filed Vitals:   09/10/14 1208 09/10/14 1745 09/10/14 2022 09/11/14 0443  BP: 152/97 175/95 170/90 148/97  Pulse: 87 60 63 82  Temp: 98.4 F (36.9 C) 98.1 F (36.7 C) 98.2 F (36.8 C) 98.1 F (36.7 C)  TempSrc: Oral Oral Oral Oral  Resp: 18 18 20 20   Height:      Weight:      SpO2: 97% 98% 93% 98%    Intake/Output Summary (Last 24 hours) at 09/11/14 1304 Last data filed at 09/11/14 0700  Gross per 24 hour  Intake   2040 ml  Output   1950 ml  Net     90 ml   Filed Weights   09/05/14 0013 09/07/14 0521 09/08/14 0217  Weight: 97.3 kg (214 lb 8.1 oz) 93.7 kg (206 lb 9.1 oz) 94.7 kg (208 lb 12.4 oz)    Exam:  General:  NAD  Cardiovascular: RRR  Respiratory: CTA biL  Abdomen: soft, non tender  MSK: no edema  Data Reviewed: Basic Metabolic Panel:  Recent Labs Lab 09/05/14 0335 09/06/14 0345 09/07/14 0333 09/08/14 0340 09/09/14 0415 09/10/14 0407 09/11/14 0640  NA 137 139 139 139 142 142 141  K 3.6* 3.1* 3.2* 3.5* 3.6* 4.0 3.7  CL 101 106 105 107 110 109 110  CO2 19 18* 21 19 18* 19 16*  GLUCOSE 122* 129* 118* 114* 109* 117* 105*  BUN 7 10 6 6 16  29* 29*  CREATININE 0.68 0.73 0.66 0.68 1.45* 3.02* 2.92*  CALCIUM 9.0 8.0* 7.9* 8.3* 8.8 8.8 8.9  MG 1.2* 1.4* 1.6 1.7 2.0  --   --   PHOS  --  1.4* 2.0* 2.4 3.7 4.3  --    Liver Function Tests:  Recent Labs Lab 09/05/14 0335 09/07/14 0333 09/08/14 0340 09/09/14 0415 09/10/14 0407  AST 68* 46*  --   --   --   ALT 78* 48  --   --   --   ALKPHOS 73 71  --   --   --   BILITOT 1.3* 1.0  --   --   --   PROT 7.3 6.4  --   --   --   ALBUMIN 3.7 2.8* 2.8* 3.0* 2.9*   CBC:  Recent Labs Lab 09/05/14 0335 09/06/14 0345 09/08/14 0340  WBC 9.2 9.6 8.0  HGB 14.8 12.7* 12.2*  HCT 40.5 36.1* 34.1*  MCV 98.8 100.3* 98.3  PLT 198 182 223   BNP (last 3 results)  Recent Labs  09/03/14 0520  PROBNP 12.6   Recent Results (from the past 240 hour(s))  MRSA PCR SCREENING     Status: None    Collection Time    09/03/14  4:52 AM      Result Value Ref Range Status   MRSA by PCR NEGATIVE  NEGATIVE Final   Comment:            The GeneXpert MRSA Assay (FDA     approved for NASAL specimens     only), is one component of a     comprehensive MRSA colonization     surveillance program. It is not     intended to diagnose MRSA     infection nor to  guide or     monitor treatment for     MRSA infections.  CULTURE, RESPIRATORY (NON-EXPECTORATED)     Status: None   Collection Time    09/05/14 12:58 PM      Result Value Ref Range Status   Specimen Description SPUTUM   Final   Special Requests NONE   Final   Gram Stain     Final   Value: FEW WBC PRESENT,BOTH PMN AND MONONUCLEAR     RARE SQUAMOUS EPITHELIAL CELLS PRESENT     ABUNDANT GRAM POSITIVE COCCI     IN PAIRS IN CLUSTERS MODERATE GRAM POSITIVE RODS     Performed at [facility]   Culture     Final   Value: NORMAL OROPHARYNGEAL FLORA     Performed at [facility]   Report Status 09/07/2014 FINAL   Final  CULTURE, BLOOD (ROUTINE X 2)     Status: None   Collection Time    09/07/14  1:44 PM      Result Value Ref Range Status   Specimen Description BLOOD RIGHT ARM   Final   Special Requests BOTTLES DRAWN AEROBIC ONLY 4CC BLUE BOTTLES    Final   Culture  Setup Time     Final   Value: 09/07/2014 18:13     Performed at [facility]   Culture     Final   Value:        BLOOD CULTURE RECEIVED NO GROWTH TO DATE CULTURE WILL BE HELD FOR 5 DAYS BEFORE ISSUING A FINAL NEGATIVE REPORT     Performed at [facility]   Report Status PENDING   Incomplete  CULTURE, BLOOD (ROUTINE X 2)     Status: None   Collection Time    09/07/14  1:48 PM      Result Value Ref Range Status   Specimen Description BLOOD RIGHT ARM   Final   Special Requests     Final   Value: BOTTLES DRAWN AEROBIC AND ANAEROBIC 10CC BOTH BOTTLES   Culture  Setup Time     Final   Value: 09/07/2014 18:17     Performed at [facility]   Culture     Final   Value:        BLOOD CULTURE RECEIVED NO GROWTH TO DATE CULTURE WILL BE HELD FOR 5 DAYS BEFORE ISSUING A FINAL NEGATIVE REPORT     Note: Culture results may be compromised due to an excessive volume of blood received in culture bottles.     Performed at [facility]   Report Status PENDING   Incomplete  CLOSTRIDIUM DIFFICILE BY PCR     Status: Abnormal   Collection Time    09/08/14  1:04 PM      Result Value Ref Range Status   C difficile by pcr POSITIVE (*) NEGATIVE Final   Comment: CRITICAL RESULT CALLED TO, READ BACK BY AND VERIFIED WITH:     J. THIGPEN RN 15:50 09/08/14 ([email or web address])     Performed at [facility]     Studies: No results found.  Scheduled Meds: . antiseptic oral rinse  7 mL Mouth Rinse BID  . atorvastatin  10 mg Oral Daily  . fluticasone  1 spray Each Nare Daily  . folic acid  1 mg Oral Daily  . loratadine  10 mg Oral Daily  . metroNIDAZOLE  500 mg Oral 3 times per day  . multivitamin with minerals  1 tablet  Oral Daily  . saccharomyces boulardii  250 mg Oral BID  . thiamine  100 mg Oral Daily  . traZODone  50 mg Oral QHS   Continuous Infusions: . sodium chloride 100 mL/hr at 09/11/14 1448    Active Problems:   Syncope   Syncope and collapse   Scalp laceration   Alcohol abuse   Non-compliant behavior   Alcohol withdrawal   Acute respiratory failure   Aspiration pneumonia   Lethargy  Time spent: [place], MD [facility] Pager [phone]. If 7 PM - 7 AM, please contact night-coverage at [email or web address], password [facility] 09/11/2014, 1:04 PM  LOS: 9 days

## 2014-09-12 LAB — BASIC METABOLIC PANEL
Anion gap: 15 (ref 5–15)
BUN: 29 mg/dL — ABNORMAL HIGH (ref 6–23)
CO2: 18 mEq/L — ABNORMAL LOW (ref 19–32)
Calcium: 9 mg/dL (ref 8.4–10.5)
Chloride: 109 mEq/L (ref 96–112)
Creatinine, Ser: 2.75 mg/dL — ABNORMAL HIGH (ref 0.50–1.35)
GFR, EST AFRICAN AMERICAN: 26 mL/min — AB (ref >90)
GFR, EST NON AFRICAN AMERICAN: 22 mL/min — AB (ref >90)
GLUCOSE: 105 mg/dL — AB (ref 70–99)
POTASSIUM: 3.9 meq/L (ref 3.7–5.3)
SODIUM: 142 meq/L (ref 137–147)

## 2014-09-12 MED ORDER — LORAZEPAM 1 MG PO TABS: 1.0000 mg | ORAL_TABLET | Freq: Three times a day (TID) | ORAL | Status: DC

## 2014-09-12 MED ORDER — TRAZODONE HCL 50 MG PO TABS: 50.0000 mg | ORAL_TABLET | Freq: Every evening | ORAL | Status: DC | PRN

## 2014-09-12 MED ORDER — SACCHAROMYCES BOULARDII 250 MG PO CAPS: 250.0000 mg | ORAL_CAPSULE | Freq: Two times a day (BID) | ORAL | Status: DC

## 2014-09-12 MED ORDER — METRONIDAZOLE 500 MG PO TABS: 500.0000 mg | ORAL_TABLET | Freq: Three times a day (TID) | ORAL | Status: DC

## 2014-09-12 MED ORDER — MIRTAZAPINE 15 MG PO TABS: 7.5000 mg | ORAL_TABLET | Freq: Every day | ORAL | Status: DC

## 2014-09-12 NOTE — Progress Notes (Signed)
Physical Therapy Treatment Patient Details Name: Joshua Henson MRN: [id number] DOB: 1946-11-08 Today's Date: 09/12/2014    History of Present Illness 68 yo male admitted with ETOH withdrawal, falls. Hx of CVA, BPPV, Meniere's disease per pt, ETOH abuse, prostate cancer, HTN, HTN, non compliance    PT Comments    Pt in bed with alarm on.  Assisted pt with amb in hallway with out any AD as pt admits he will not be using anything when he gets home. Assisted with negotiating stairs and performed BERG balance test.  Pt stated he has Meniere's Disease which causes him to black out.    Follow Up Recommendations  Home health PT;Supervision/Assistance - 24 hour     Equipment Recommendations  Rolling walker with 5" wheels    Recommendations for Other Services       Precautions / Restrictions Precautions Precautions: Fall    Mobility  Bed Mobility Overal bed mobility: Modified Independent Bed Mobility: Supine to Sit;Sit to Supine              Transfers Overall transfer level: Needs assistance Equipment used: None Transfers: Sit to/from Stand Sit to Stand: Supervision;Min guard         General transfer comment: impulsive and still unsteady.    Ambulation/Gait Ambulation/Gait assistance: Supervision;Min guard;Min assist Ambulation Distance (Feet): 450 Feet Assistive device: None;1 person hand held assist Gait Pattern/deviations: Step-through pattern;Staggering left;Staggering right Gait velocity: WFL   General Gait Details: mild unsteady drunken gait.  Amb w/o any AD as pt admits he probably won't use anything.  Pt did have x 2 lateral LOB esp with turns.   Stairs Stairs: Yes Stairs assistance: Min assist Stair Management: One rail Left;Step to pattern Number of Stairs: 10 General stair comments: unsteady and VC's for safety to decrease speed  Wheelchair Mobility    Modified Rankin (Stroke Patients Only)       Balance Overall balance assessment: Needs  assistance                                  Cognition                            Exercises      General Comments        Pertinent Vitals/Pain      Home Living                      Prior Function            PT Goals (current goals can now be found in the care plan section) Progress towards PT goals: Progressing toward goals    Frequency  Min 3X/week    PT Plan      Co-evaluation             End of Session Equipment Utilized During Treatment: Gait belt Activity Tolerance: Patient tolerated treatment well Patient left: in bed;with call bell/phone within reach     Time: 1042-1105 PT Time Calculation (min): 23 min  Charges:  $Gait Training: 8-22 mins $Therapeutic Activity: 8-22 mins                    G Codes:      Rica Koyanagi  PTA [facility]  Acute  Rehab Pager      [phone]

## 2014-09-12 NOTE — Progress Notes (Signed)
Clinical Social Work  CSW followed up with patient in order to discuss SA options. Patient reports he is DC today and cannot wait to get home. Patient reports he had an appointment scheduled today with "[facility]" but had to reschedule since he was still in the hospital. Patient reports he reschedule and follow up next week.  CSW provided patient with residential referrals and intensive outpatient (IOP). Patient reports he is unsure about options. Patient reports he cannot complete residential treatment due to work schedule but that IOP might be an option. Patient reports he will review options and schedule an appointment if he is interested.  [place], [place] [phone]

## 2014-09-12 NOTE — Discharge Summary (Addendum)
Physician Discharge Summary  Joshua Henson KDT:[id number] DOB: 27-Jan-1946 DOA: 09/02/2014  PCP: Adella Hare, MD  Admit date: 09/02/2014 Discharge date: 09/12/2014  Time spent: 35 minutes  Recommendations for Outpatient Follow-up:  1. Follow up with PCP Dr. Brigitte Pulse next week as scheduled  2. Follow up with outpatient psychiatry    Recommendations for primary care physician for things to follow:  Repeat BMP  Discharge Diagnoses:  Active Problems:   Syncope   Syncope and collapse   Scalp laceration   Alcohol abuse   Non-compliant behavior   Alcohol withdrawal   Acute respiratory failure   Aspiration pneumonia   Lethargy  Discharge Condition: stable  Diet recommendation: heart healthy  Filed Weights   09/05/14 0013 09/07/14 0521 09/08/14 0217  Weight: 97.3 kg (214 lb 8.1 oz) 93.7 kg (206 lb 9.1 oz) 94.7 kg (208 lb 12.4 oz)   History of present illness:  Joshua Henson is a 68 y.o. WM PMHx depression, alcohol abuse, HX stroke, Hx prostate cancer, HTN, HLD,  presents with falls in his home. He has friends with him who reports that they have visited him and he has not recalled them being there and he has also been falling very frequently. Apparently had fallen sometime today and they went over to his house and found him lying on the floor. The patient is a heavy drinker. He did go through detox had quit for about a week but now has rebounded and been drinking heavily for a number of days. The patient reports that he has some tremoring right now and he has had significant withdrawal in the past when he has tried to quit. The patient had been admitted about 2 months ago with a combination of a stroke and fall associated with alcoholism as well. The patient recognizes that he has very poor coordination and experiences frequent falls. 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  Hospital Course:    Alcohol withdrawal/delirium tremens - before admission drinking a pint of Vodka.  - Patient started on CIWA protocol on admission, experienced severe withdrawal and slowly resolved, mental status close to baseline on discharge, - He was recently in the hospital for stroke in July of 2015, also went through detoxification.  - Placed on Precedex for brief period of time C. difficile colitis  - Has diarrhea, his stool is positive for C. difficile by PCR.  - Patient was on Zosyn and vancomycin for presumed pneumonia, broad-spectrum antibiotics discontinued.  - Patient started on Flagyl 500 mg orally every 8 hours, Florastor twice a day, his diarrhea resolved on discharge and he was starting to have formed bowel movements.  Acute kidney injury  - Creatinine 0.6 at baseline, patient was on Indocin and Lasix, creatinine increased to 1.45 then up to 3. His Indocin and Lasix were discontinued with gradual improvement in his renal function from 3 > 2.9 > 2.75; patient has excellent urine output and will have a scheduled follow up appointment with his PCP Dr. Brigitte Pulse mid next week. He was counseled to avoid NSAIDs, hold his diuretic and have his renal function rechecked next week.  Pneumonia, likely aspiration  - Patient developed fever of 101.4, and tachypnea.  - Chest x-ray showed right lower lobe infiltrates.  - Initially on Unasyn, add mucolytics, antitussives and oxygen as needed, then because he was febrile he was broadened to Zosyn and vancomycin, which were  discontinued on 9/21 because of stool positive for C. difficile.  - stable on room air without further symptoms Alcohol abuse  - Patient continues to drink significant amounts of alcohol, alcohol level on admission was 313.  - Continue multivitamins, folate and thiamine.  - psychiatry was consulted  Transaminitis  - Most likely secondary to alcohol abuse, improving - Screening for hepatitis C is negative  Syncope with collapse  - Patient  believes his syncope/collapse secondary to Mnire's disease.  - 2-D echocardiogram from 07/02/2014 showed normal LVEF.  - Syncope likely secondary to alcohol intoxication plus Mnire's disease.  Depression  - outpatient psychiatry follow up  Scalp laceration  - Stable  Mnire's disease?  - Unsure patient has received a diagnosis of this disease process. Would check with patient's family to determine if and when patient was seen by a neurologist who diagnosed with Mnire's.  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  Right elbow pain  - Patient also has bilateral great toe base pain, denies any history of gout.  - Patient started on Indocin, but with rising creatinine discontinued.  - elbow pain resolved Hypokalemia  - resolved  Procedures:  None    Consultations:  Psychiatry   Discharge Exam: Filed Vitals:   09/11/14 1300 09/11/14 1600 09/11/14 2112 09/12/14 0352  BP: 164/106 158/92 157/97 163/89  Pulse: 68  97 53  Temp: 98.1 F (36.7 C)  98.7 F (37.1 C) 98.5 F (36.9 C)  TempSrc: Oral  Oral Oral  Resp: 18  20 22   Height:      Weight:      SpO2: 93%  97% 96%   General: NAD Cardiovascular: RRR Respiratory: CTA biL  Discharge Instructions    Medication List    STOP taking these medications       triamterene-hydrochlorothiazide 37.5-25 MG per capsule  Commonly known as:  DYAZIDE      TAKE these medications       aspirin 81 MG EC tablet  Take 1 tablet (81 mg total) by mouth daily.     atorvastatin 10 MG tablet  Commonly known as:  LIPITOR  Take 1 tablet (10 mg total) by mouth daily.     BRINTELLIX 10 MG Tabs  Generic drug:  Vortioxetine HBr  Take 10 mg by mouth daily.     fluticasone 50 MCG/ACT nasal spray  Commonly known as:  FLONASE  Place 1 spray into both nostrils daily as needed for allergies or rhinitis.     folic acid 1 MG tablet  Commonly known as:  FOLVITE  Take 1 tablet  (1 mg total) by mouth daily.     loratadine 10 MG tablet  Commonly known as:  CLARITIN  Take 10 mg by mouth daily as needed for allergies.     LORazepam 1 MG tablet  Commonly known as:  ATIVAN  Take 1 tablet (1 mg total) by mouth 3 (three) times daily.     metroNIDAZOLE 500 MG tablet  Commonly known as:  FLAGYL  Take 1 tablet (500 mg total) by mouth every 8 (eight) hours.     mirtazapine 15 MG tablet  Commonly known as:  REMERON  Take 0.5 tablets (7.5 mg total) by mouth at bedtime.     multivitamin with minerals tablet  Take 1 tablet by mouth daily.     saccharomyces boulardii 250 MG capsule  Commonly known as:  FLORASTOR  Take  1 capsule (250 mg total) by mouth 2 (two) times daily.     tamsulosin 0.4 MG Caps capsule  Commonly known as:  FLOMAX  Take 1 capsule (0.4 mg total) by mouth daily.     traZODone 50 MG tablet  Commonly known as:  DESYREL  Take 1 tablet (50 mg total) by mouth at bedtime as needed for sleep.       Follow-up Information   Follow up with [facility]. ([facility])    Contact information:   [place] [address] [phone]                         Significant Diagnostic Studies: Ct Head Wo Contrast 09/03/2014   CLINICAL DATA:  ETOH. Fall. History of stroke. Prostate cancer in 2010.  EXAM: CT HEAD WITHOUT CONTRAST  TECHNIQUE: Contiguous axial images were obtained from the base of the skull through the vertex without intravenous contrast.  COMPARISON:  07/04/2014  FINDINGS: There is mild central and cortical atrophy. Periventricular white matter changes are consistent with small vessel disease. There is no evidence for hemorrhage, mass lesion, or acute infarction. There is moderate bilateral maxillary sinus disease. Suspect caries. No calvarial fracture.  IMPRESSION: 1. Atrophy and small vessel disease. 2.  No evidence for acute intracranial abnormality. 3. Bilateral sinus disease. 4. Suspect caries.    Electronically Signed   By: Shon Hale M.D.   On: 09/03/2014 00:53   Dg Chest Port 1 View 09/09/2014   CLINICAL DATA:  Pneumonia.  EXAM: PORTABLE CHEST - 1 VIEW  COMPARISON:  09/07/2014.  07/01/2014.  FINDINGS: Mediastinum and hilar structures are normal. Stable cardiomegaly. Normal pulmonary vascularity. Persistent left base subsegmental atelectasis. No pleural effusion or pneumothorax. No acute osseus abnormality. Carotid vascular disease.  IMPRESSION: 1. Persistent left base subsegmental atelectasis. 2. Stable cardiomegaly, no CHF. 3. Carotid vascular disease 2   Electronically Signed   By: Florence   On: 09/09/2014 07:39   Dg Chest Port 1 View 09/07/2014   CLINICAL DATA:  Acute onset shortness of breath earlier today. Followup right basilar pneumonia.  EXAM: PORTABLE CHEST - 1 VIEW  COMPARISON:  Portable chest x-ray yesterday and 09/05/2014. Two-view chest x-ray 07/01/2014.  FINDINGS: Cardiac silhouette mildly to moderately enlarged, unchanged. Suboptimal inspiration accounts for crowded bronchovascular markings diffusely. Previously identified patchy opacities at the medial right lung base have shown continued improvement. New airspace consolidation in the left lower lobe with silhouetting of the lateral left hemidiaphragm. Lungs otherwise clear. Pulmonary vascularity normal. Stable mild elevation the right hemidiaphragm.  IMPRESSION: New left lower lobe atelectasis versus pneumonia. Improved patchy bronchopneumonia in the medial right lung base.   Electronically Signed   By: Evangeline Dakin M.D.   On: 09/07/2014 13:24   Dg Chest Port 1 View 09/06/2014   CLINICAL DATA:  Pneumonia.  EXAM: PORTABLE CHEST - 1 VIEW  COMPARISON:  09/05/2014  FINDINGS: Cardiac silhouette remains mildly enlarged. Lungs are slightly better inflated than on the prior study. Mild, diffuse interstitial prominence does not appear significantly changed. Patchy right lower lobe opacities are stable to minimally improved. No  new consolidation is seen. No pleural effusion or pneumothorax is identified.  IMPRESSION: Stable to minimally improved right lower lobe infiltrate.   Electronically Signed   By: Logan Bores   On: 09/06/2014 07:30   Dg Chest Port 1 View 09/05/2014   CLINICAL DATA:  Fever and tachypnea  EXAM:  PORTABLE CHEST - 1 VIEW  COMPARISON:  07/01/2014  .  FINDINGS: Right lower lobe infiltrate may represent atelectasis or pneumonia. Left lung clear.  Cardiac enlargement without heart failure or effusion  IMPRESSION: Right lower lobe infiltrate, possible pneumonia.   Electronically Signed   By: Franchot Gallo M.D.  On: 09/05/2014 10:02   Microbiology: Recent Results (from the past 240 hour(s))  MRSA PCR SCREENING     Status: None   Collection Time    09/03/14  4:52 AM      Result Value Ref Range Status   MRSA by PCR NEGATIVE  NEGATIVE Final   Comment:            The GeneXpert MRSA Assay (FDA     approved for NASAL specimens     only), is one component of a     comprehensive MRSA colonization     surveillance program. It is not     intended to diagnose MRSA     infection nor to guide or     monitor treatment for     MRSA infections.  CULTURE, RESPIRATORY (NON-EXPECTORATED)     Status: None   Collection Time    09/05/14 12:58 PM      Result Value Ref Range Status   Specimen Description SPUTUM   Final   Special Requests NONE   Final   Gram Stain     Final   Value: FEW WBC PRESENT,BOTH PMN AND MONONUCLEAR     RARE SQUAMOUS EPITHELIAL CELLS PRESENT     ABUNDANT GRAM POSITIVE COCCI     IN PAIRS IN CLUSTERS MODERATE GRAM POSITIVE RODS     Performed at [facility]   Culture     Final   Value: NORMAL OROPHARYNGEAL FLORA     Performed at [facility]   Report Status 09/07/2014 FINAL   Final  CULTURE, BLOOD (ROUTINE X 2)     Status: None   Collection Time    09/07/14  1:44 PM      Result Value Ref Range Status   Specimen Description BLOOD RIGHT ARM   Final   Special Requests  BOTTLES DRAWN AEROBIC ONLY 4CC BLUE BOTTLES    Final   Culture  Setup Time     Final   Value: 09/07/2014 18:13     Performed at [facility]   Culture     Final   Value:        BLOOD CULTURE RECEIVED NO GROWTH TO DATE CULTURE WILL BE HELD FOR 5 DAYS BEFORE ISSUING A FINAL NEGATIVE REPORT     Performed at [facility]   Report Status PENDING   Incomplete  CULTURE, BLOOD (ROUTINE X 2)     Status: None   Collection Time    09/07/14  1:48 PM      Result Value Ref Range Status   Specimen Description BLOOD RIGHT ARM   Final   Special Requests     Final   Value: BOTTLES DRAWN AEROBIC AND ANAEROBIC 10CC BOTH BOTTLES   Culture  Setup Time     Final   Value: 09/07/2014 18:17     Performed at [facility]   Culture     Final   Value:        BLOOD CULTURE RECEIVED NO GROWTH TO DATE CULTURE WILL BE HELD FOR 5 DAYS BEFORE ISSUING A FINAL NEGATIVE REPORT     Note: Culture results may be compromised due to  an excessive volume of blood received in culture bottles.     Performed at [facility]   Report Status PENDING   Incomplete  CLOSTRIDIUM DIFFICILE BY PCR     Status: Abnormal   Collection Time    09/08/14  1:04 PM      Result Value Ref Range Status   C difficile by pcr POSITIVE (*) NEGATIVE Final   Comment: CRITICAL RESULT CALLED TO, READ BACK BY AND VERIFIED WITH:     J. THIGPEN RN 15:50 09/08/14 ([email or web address])     Performed at [facility]: Basic Metabolic Panel:  Recent Labs Lab 09/06/14 0345 09/07/14 8115 09/08/14 0340 09/09/14 0415 09/10/14 0407 09/11/14 0640 09/12/14 0633  NA 139 139 139 142 142 141 142  K 3.1* 3.2* 3.5* 3.6* 4.0 3.7 3.9  CL 106 105 107 110 109 110 109  CO2 18* 21 19 18* 19 16* 18*  GLUCOSE 129* 118* 114* 109* 117* 105* 105*  BUN 10 6 6 16  29* 29* 29*  CREATININE 0.73 0.66 0.68 1.45* 3.02* 2.92* 2.75*  CALCIUM 8.0* 7.9* 8.3* 8.8 8.8 8.9 9.0  MG 1.4* 1.6 1.7 2.0  --   --   --   PHOS 1.4* 2.0* 2.4 3.7 4.3  --   --      Liver Function Tests:  Recent Labs Lab 09/07/14 0333 09/08/14 0340 09/09/14 0415 09/10/14 0407  AST 46*  --   --   --   ALT 48  --   --   --   ALKPHOS 71  --   --   --   BILITOT 1.0  --   --   --   PROT 6.4  --   --   --   ALBUMIN 2.8* 2.8* 3.0* 2.9*   CBC:  Recent Labs Lab 09/06/14 0345 09/08/14 0340  WBC 9.6 8.0  HGB 12.7* 12.2*  HCT 36.1* 34.1*  MCV 100.3* 98.3  PLT 182 223   BNP: BNP (last 3 results)  Recent Labs  09/03/14 0520  PROBNP 12.6    Signed:  Kagan Hietpas  [facility] 09/12/2014, 3:33 PM

## 2014-09-12 NOTE — Discharge Instructions (Signed)
You were cared for by a hospitalist during your hospital stay. If you have any questions about your discharge medications or the care you received while you were in the hospital after you are discharged, you can call the unit and asked to speak with the hospitalist on call if the hospitalist that took care of you is not available. Once you are discharged, your primary care physician will handle any further medical issues. Please note that NO REFILLS for any discharge medications will be authorized once you are discharged, as it is imperative that you return to your primary care physician (or establish a relationship with a primary care physician if you do not have one) for your aftercare needs so that they can reassess your need for medications and monitor your lab values.     If you do not have a primary care physician, you can call [phone] for a physician referral.  Follow with Primary MD in 5-7 days   Get CBC, CMP checked by your doctor and again as further instructed.  Get a 2 view Chest X ray done next visit if you had Pneumonia of Lung problems at the Hospital.  Avoid NSAIDs and all over the counter pain medications until you talk to your primary MD because of your renal dysfunction.   Get Medicines reviewed and adjusted.  Please request your Prim.MD to go over all Hospital Tests and Procedure/Radiological results at the follow up, please get all Hospital records sent to your Prim MD by signing hospital release before you go home.  Activity: As tolerated with Full fall precautions use walker/cane & assistance as needed  Diet: regular  For Heart failure patients - Check your Weight same time everyday, if you gain over 2 pounds, or you develop in leg swelling, experience more shortness of breath or chest pain, call your Primary MD immediately. Follow Cardiac Low Salt Diet and 1.8 lit/day fluid restriction.  Disposition Home  If you experience worsening of your admission symptoms, develop  shortness of breath, life threatening emergency, suicidal or homicidal thoughts you must seek medical attention immediately by calling 911 or calling your MD immediately  if symptoms less severe.  You Must read complete instructions/literature along with all the possible adverse reactions/side effects for all the Medicines you take and that have been prescribed to you. Take any new Medicines after you have completely understood and accpet all the possible adverse reactions/side effects.   Do not drive and provide baby sitting services if your were admitted for syncope or siezures until you have seen by Primary MD or a Neurologist and advised to do so again.  Do not drive when taking Pain medications.   Do not take more than prescribed Pain, Sleep and Anxiety Medications  Special Instructions: If you have smoked or chewed Tobacco  in the last 2 yrs please stop smoking, stop any regular Alcohol  and or any Recreational drug use.  Wear Seat belts while driving.

## 2014-09-13 LAB — CULTURE, BLOOD (ROUTINE X 2)
CULTURE: NO GROWTH
CULTURE: NO GROWTH

## 2014-09-23 ENCOUNTER — Inpatient Hospital Stay: Payer: Medicare Other | Admitting: Physical Medicine & Rehabilitation

## 2014-09-23 ENCOUNTER — Encounter: Payer: Medicare Other | Attending: Physical Medicine & Rehabilitation

## 2014-09-25 DIAGNOSIS — N179 Acute kidney failure, unspecified: Secondary | ICD-10-CM | POA: Diagnosis not present

## 2014-09-25 DIAGNOSIS — C61 Malignant neoplasm of prostate: Secondary | ICD-10-CM | POA: Diagnosis not present

## 2014-10-16 DIAGNOSIS — F332 Major depressive disorder, recurrent severe without psychotic features: Secondary | ICD-10-CM | POA: Diagnosis not present

## 2014-12-09 ENCOUNTER — Emergency Department (HOSPITAL_COMMUNITY): Payer: Medicare Other

## 2014-12-09 ENCOUNTER — Emergency Department (HOSPITAL_COMMUNITY)
Admission: EM | Admit: 2014-12-09 | Discharge: 2014-12-11 | Disposition: A | Discharge status: Home / Self Care | Payer: Medicare Other | Attending: Emergency Medicine | Admitting: Emergency Medicine

## 2014-12-09 ENCOUNTER — Encounter (HOSPITAL_COMMUNITY): Payer: Self-pay | Admitting: Emergency Medicine

## 2014-12-09 DIAGNOSIS — Y9389 Activity, other specified: Secondary | ICD-10-CM | POA: Diagnosis not present

## 2014-12-09 DIAGNOSIS — W1839XA Other fall on same level, initial encounter: Secondary | ICD-10-CM | POA: Diagnosis not present

## 2014-12-09 DIAGNOSIS — A047 Enterocolitis due to Clostridium difficile: Secondary | ICD-10-CM | POA: Insufficient documentation

## 2014-12-09 DIAGNOSIS — A0472 Enterocolitis due to Clostridium difficile, not specified as recurrent: Secondary | ICD-10-CM

## 2014-12-09 DIAGNOSIS — Y9289 Other specified places as the place of occurrence of the external cause: Secondary | ICD-10-CM | POA: Insufficient documentation

## 2014-12-09 DIAGNOSIS — Z7982 Long term (current) use of aspirin: Secondary | ICD-10-CM | POA: Insufficient documentation

## 2014-12-09 DIAGNOSIS — Z79899 Other long term (current) drug therapy: Secondary | ICD-10-CM | POA: Diagnosis not present

## 2014-12-09 DIAGNOSIS — Z043 Encounter for examination and observation following other accident: Secondary | ICD-10-CM | POA: Diagnosis not present

## 2014-12-09 DIAGNOSIS — Z9181 History of falling: Secondary | ICD-10-CM | POA: Insufficient documentation

## 2014-12-09 DIAGNOSIS — Z8673 Personal history of transient ischemic attack (TIA), and cerebral infarction without residual deficits: Secondary | ICD-10-CM | POA: Insufficient documentation

## 2014-12-09 DIAGNOSIS — R079 Chest pain, unspecified: Secondary | ICD-10-CM | POA: Diagnosis not present

## 2014-12-09 DIAGNOSIS — Z720 Tobacco use: Secondary | ICD-10-CM | POA: Diagnosis not present

## 2014-12-09 DIAGNOSIS — R531 Weakness: Secondary | ICD-10-CM | POA: Insufficient documentation

## 2014-12-09 DIAGNOSIS — R42 Dizziness and giddiness: Secondary | ICD-10-CM | POA: Insufficient documentation

## 2014-12-09 DIAGNOSIS — Z792 Long term (current) use of antibiotics: Secondary | ICD-10-CM | POA: Insufficient documentation

## 2014-12-09 DIAGNOSIS — Y998 Other external cause status: Secondary | ICD-10-CM | POA: Insufficient documentation

## 2014-12-09 DIAGNOSIS — W19XXXA Unspecified fall, initial encounter: Secondary | ICD-10-CM

## 2014-12-09 DIAGNOSIS — F1010 Alcohol abuse, uncomplicated: Secondary | ICD-10-CM | POA: Insufficient documentation

## 2014-12-09 DIAGNOSIS — Z8546 Personal history of malignant neoplasm of prostate: Secondary | ICD-10-CM | POA: Diagnosis not present

## 2014-12-09 DIAGNOSIS — F329 Major depressive disorder, single episode, unspecified: Secondary | ICD-10-CM | POA: Diagnosis not present

## 2014-12-09 DIAGNOSIS — S0990XA Unspecified injury of head, initial encounter: Secondary | ICD-10-CM | POA: Diagnosis not present

## 2014-12-09 LAB — CBC WITH DIFFERENTIAL/PLATELET
Basophils Absolute: 0.1 10*3/uL (ref 0.0–0.1)
Basophils Relative: 2 % — ABNORMAL HIGH (ref 0–1)
EOS PCT: 2 % (ref 0–5)
Eosinophils Absolute: 0.1 10*3/uL (ref 0.0–0.7)
HEMATOCRIT: 36 % — AB (ref 39.0–52.0)
Hemoglobin: 13.1 g/dL (ref 13.0–17.0)
Lymphocytes Relative: 38 % (ref 12–46)
Lymphs Abs: 1.4 10*3/uL (ref 0.7–4.0)
MCH: 36.6 pg — AB (ref 26.0–34.0)
MCHC: 36.4 g/dL — AB (ref 30.0–36.0)
MCV: 100.6 fL — AB (ref 78.0–100.0)
MONO ABS: 0.5 10*3/uL (ref 0.1–1.0)
Monocytes Relative: 12 % (ref 3–12)
Neutro Abs: 1.8 10*3/uL (ref 1.7–7.7)
Neutrophils Relative %: 46 % (ref 43–77)
PLATELETS: 172 10*3/uL (ref 150–400)
RBC: 3.58 MIL/uL — ABNORMAL LOW (ref 4.22–5.81)
RDW: 16.9 % — AB (ref 11.5–15.5)
WBC: 3.8 10*3/uL — ABNORMAL LOW (ref 4.0–10.5)

## 2014-12-09 LAB — COMPREHENSIVE METABOLIC PANEL
ALK PHOS: 49 U/L (ref 39–117)
ALT: 56 U/L — AB (ref 0–53)
ANION GAP: 11 (ref 5–15)
AST: 111 U/L — ABNORMAL HIGH (ref 0–37)
Albumin: 3.6 g/dL (ref 3.5–5.2)
BUN: 15 mg/dL (ref 6–23)
CO2: 24 mmol/L (ref 19–32)
Calcium: 9 mg/dL (ref 8.4–10.5)
Chloride: 105 mEq/L (ref 96–112)
Creatinine, Ser: 1.02 mg/dL (ref 0.50–1.35)
GFR, EST AFRICAN AMERICAN: 86 mL/min — AB (ref >90)
GFR, EST NON AFRICAN AMERICAN: 74 mL/min — AB (ref >90)
GLUCOSE: 117 mg/dL — AB (ref 70–99)
POTASSIUM: 5.4 mmol/L — AB (ref 3.5–5.1)
Sodium: 140 mmol/L (ref 135–145)
Total Bilirubin: 0.6 mg/dL (ref 0.3–1.2)
Total Protein: 6.2 g/dL (ref 6.0–8.3)

## 2014-12-09 LAB — I-STAT CHEM 8, ED
BUN: 16 mg/dL (ref 6–23)
CREATININE: 1.2 mg/dL (ref 0.50–1.35)
Calcium, Ion: 1.06 mmol/L — ABNORMAL LOW (ref 1.13–1.30)
Chloride: 105 mEq/L (ref 96–112)
GLUCOSE: 122 mg/dL — AB (ref 70–99)
HCT: 36 % — ABNORMAL LOW (ref 39.0–52.0)
Hemoglobin: 12.2 g/dL — ABNORMAL LOW (ref 13.0–17.0)
POTASSIUM: 3.4 mmol/L — AB (ref 3.5–5.1)
SODIUM: 142 mmol/L (ref 135–145)
TCO2: 21 mmol/L (ref 0–100)

## 2014-12-09 LAB — ETHANOL: Alcohol, Ethyl (B): 125 mg/dL — ABNORMAL HIGH (ref 0–9)

## 2014-12-09 LAB — LIPASE, BLOOD: Lipase: 56 U/L (ref 11–59)

## 2014-12-09 MED ORDER — QUETIAPINE FUMARATE 25 MG PO TABS
100.0000 mg | ORAL_TABLET | Freq: Every day | ORAL | Status: DC
  Administered 2014-12-09 – 2014-12-10 (×2): 100 mg via ORAL
  Filled 2014-12-09 (×2): qty 4

## 2014-12-09 MED ORDER — LORAZEPAM 2 MG/ML IJ SOLN: 0.0000 mg | Freq: Two times a day (BID) | INTRAMUSCULAR | Status: DC

## 2014-12-09 MED ORDER — ALUM & MAG HYDROXIDE-SIMETH 200-200-20 MG/5ML PO SUSP: 30.0000 mL | ORAL | Status: DC | PRN

## 2014-12-09 MED ORDER — NICOTINE 21 MG/24HR TD PT24: 21.0000 mg | MEDICATED_PATCH | Freq: Every day | TRANSDERMAL | Status: DC

## 2014-12-09 MED ORDER — LORAZEPAM 1 MG PO TABS
1.0000 mg | ORAL_TABLET | Freq: Three times a day (TID) | ORAL | Status: DC
  Administered 2014-12-09: 1 mg via ORAL
  Filled 2014-12-09: qty 1

## 2014-12-09 MED ORDER — VORTIOXETINE HBR 5 MG PO TABS
10.0000 mg | ORAL_TABLET | Freq: Every morning | ORAL | Status: DC
Start: 2014-12-10 — End: 2014-12-11
  Administered 2014-12-10: 10 mg via ORAL
  Filled 2014-12-09 (×2): qty 2

## 2014-12-09 MED ORDER — FLUTICASONE PROPIONATE 50 MCG/ACT NA SUSP
1.0000 | Freq: Every day | NASAL | Status: DC | PRN
  Filled 2014-12-09: qty 16

## 2014-12-09 MED ORDER — LORAZEPAM 1 MG PO TABS
0.0000 mg | ORAL_TABLET | Freq: Two times a day (BID) | ORAL | Status: DC
  Administered 2014-12-09: 1 mg via ORAL
  Filled 2014-12-09: qty 1

## 2014-12-09 MED ORDER — TRIAMTERENE-HCTZ 37.5-25 MG PO CAPS
1.0000 | ORAL_CAPSULE | Freq: Every day | ORAL | Status: DC
  Administered 2014-12-10: 1 via ORAL
  Filled 2014-12-09 (×2): qty 1

## 2014-12-09 MED ORDER — ZOLPIDEM TARTRATE 5 MG PO TABS
5.0000 mg | ORAL_TABLET | Freq: Every evening | ORAL | Status: DC | PRN
  Administered 2014-12-10 (×2): 5 mg via ORAL
  Filled 2014-12-09 (×2): qty 1

## 2014-12-09 MED ORDER — ONDANSETRON HCL 4 MG PO TABS: 4.0000 mg | ORAL_TABLET | Freq: Three times a day (TID) | ORAL | Status: DC | PRN

## 2014-12-09 MED ORDER — LORAZEPAM 2 MG/ML IJ SOLN: 0.0000 mg | Freq: Four times a day (QID) | INTRAMUSCULAR | Status: DC

## 2014-12-09 MED ORDER — ASPIRIN 81 MG PO CHEW
81.0000 mg | CHEWABLE_TABLET | Freq: Every day | ORAL | Status: DC
  Administered 2014-12-10: 81 mg via ORAL
  Filled 2014-12-09 (×3): qty 1

## 2014-12-09 MED ORDER — LORAZEPAM 1 MG PO TABS
0.0000 mg | ORAL_TABLET | Freq: Four times a day (QID) | ORAL | Status: DC
  Administered 2014-12-09: 1 mg via ORAL
  Administered 2014-12-09: 2 mg via ORAL
  Administered 2014-12-10 (×2): 1 mg via ORAL
  Administered 2014-12-10: 2 mg via ORAL
  Administered 2014-12-11: 1 mg via ORAL
  Filled 2014-12-09: qty 2
  Filled 2014-12-09 (×3): qty 1
  Filled 2014-12-09: qty 2
  Filled 2014-12-09: qty 1

## 2014-12-09 MED ORDER — METOPROLOL SUCCINATE ER 25 MG PO TB24
25.0000 mg | ORAL_TABLET | Freq: Every day | ORAL | Status: DC
  Administered 2014-12-10: 25 mg via ORAL
  Filled 2014-12-09: qty 1

## 2014-12-09 MED ORDER — SODIUM CHLORIDE 0.9 % IV BOLUS (SEPSIS)
1000.0000 mL | Freq: Once | INTRAVENOUS | Status: AC
  Administered 2014-12-09: 1000 mL via INTRAVENOUS

## 2014-12-09 MED ORDER — VORTIOXETINE HBR 10 MG PO TABS: 10.0000 mg | ORAL_TABLET | Freq: Every day | ORAL | Status: DC

## 2014-12-09 MED ORDER — ATORVASTATIN CALCIUM 10 MG PO TABS
10.0000 mg | ORAL_TABLET | Freq: Every day | ORAL | Status: DC
  Administered 2014-12-10: 10 mg via ORAL
  Filled 2014-12-09 (×2): qty 1

## 2014-12-09 MED ORDER — THIAMINE HCL 100 MG/ML IJ SOLN
100.0000 mg | Freq: Every day | INTRAMUSCULAR | Status: DC
  Administered 2014-12-09: 100 mg via INTRAVENOUS
  Filled 2014-12-09: qty 2

## 2014-12-09 MED ORDER — ACETAMINOPHEN 325 MG PO TABS: 650.0000 mg | ORAL_TABLET | ORAL | Status: DC | PRN

## 2014-12-09 MED ORDER — LORAZEPAM 1 MG PO TABS: 0.0000 mg | ORAL_TABLET | Freq: Two times a day (BID) | ORAL | Status: DC

## 2014-12-09 MED ORDER — LORAZEPAM 1 MG PO TABS: 1.0000 mg | ORAL_TABLET | Freq: Three times a day (TID) | ORAL | Status: DC | PRN

## 2014-12-09 MED ORDER — LORATADINE 10 MG PO TABS
10.0000 mg | ORAL_TABLET | Freq: Every day | ORAL | Status: DC | PRN
  Filled 2014-12-09: qty 1

## 2014-12-09 MED ORDER — VITAMIN B-1 100 MG PO TABS
100.0000 mg | ORAL_TABLET | Freq: Every day | ORAL | Status: DC
Start: 2014-12-09 — End: 2014-12-11
  Administered 2014-12-09 – 2014-12-10 (×2): 100 mg via ORAL
  Filled 2014-12-09 (×2): qty 1

## 2014-12-09 NOTE — ED Notes (Signed)
Pt placed in paper scrubs at this time. Pt denies SI/HI ideations

## 2014-12-09 NOTE — ED Provider Notes (Signed)
TTS counselor states patient will be re-evaluated in the morning.   Evelina Bucy, MD 12/10/14 [phone]

## 2014-12-09 NOTE — ED Notes (Signed)
Pts friend Gary's phone number is [phone]

## 2014-12-09 NOTE — ED Provider Notes (Signed)
CSN: [id number]     Arrival date & time 12/09/14  1257 History   First MD Initiated Contact with Patient 12/09/14 1336     Chief Complaint  Patient presents with  . Fall  . Weakness  . Chest Pain   Patient is a 68 y.o. male presenting with fall, weakness, and chest pain.  Fall Associated symptoms include weakness. Pertinent negatives include no abdominal pain, chest pain, fever, nausea or vomiting.  Weakness Associated symptoms include weakness. Pertinent negatives include no abdominal pain, chest pain, fever, nausea or vomiting.  Chest Pain Associated symptoms: dizziness and weakness   Associated symptoms: no abdominal pain, no fever, no nausea, no shortness of breath and not vomiting     Mr. Belson is a 68 year old male with history of alcohol abuse who presents with gait instability. He reports seeing his PCP, Dr. Mayra Neer, on 12/14 who felt he may have hyponatremia at his last visit. He reports drinking half to full 1/5 of liquor a day and had a drink around noon today. He denies any history of prior DTs but feels he may not have gone without alcohol for long enough to have them. He reports falling 3-4 times in the past month without hitting his head on the ground. Though he reports feeling depressed, he has had fleeting thoughts of wanting to hurt himself but no plan; he does have access to firearms at home but cites family as his deterrent to using them. He denies missing any of his medication, recent illness, sick contacts, diarrhea, nausea, vomiting, chest pain (inconsistent with prior triage note), change in vision.   Past Medical History  Diagnosis Date  . Prostate cancer 2010    External beam radiation (urol - Risa Grill, XRT Valere Dross)  . Stroke   . Depression    Past Surgical History  Procedure Laterality Date  . None     Family History  Problem Relation Age of Onset  . Cancer Mother     esophageal  . Hypertension Mother   . Diabetes Mother   . Cancer Father    prostate  . Heart disease Maternal Grandfather     MI  . Cancer Paternal Grandfather     prostate   History  Substance Use Topics  . Smoking status: Current Every Day Smoker -- 1.00 packs/day for 30 years    Types: Cigarettes  . Smokeless tobacco: Never Used  . Alcohol Use: 3.5 oz/week    7 drink(s) per week     Comment: 14 oz per day    Review of Systems  Constitutional: Negative for fever.  Respiratory: Negative for shortness of breath.   Cardiovascular: Negative for chest pain.  Gastrointestinal: Negative for nausea, vomiting, abdominal pain and diarrhea.  Neurological: Positive for dizziness and weakness. Negative for seizures.  Psychiatric/Behavioral: Positive for dysphoric mood.       Fleeting thoughts of wanting to hurt himself though denies wanting to act on in it       Allergies  Celebrex  Home Medications   Prior to Admission medications   Medication Sig Start Date End Date Taking? Authorizing Provider  aspirin 81 MG EC tablet Take 1 tablet (81 mg total) by mouth daily. 07/11/14   Lavon Paganini Angiulli, PA-C  atorvastatin (LIPITOR) 10 MG tablet Take 1 tablet (10 mg total) by mouth daily. 07/11/14   Lavon Paganini Angiulli, PA-C  Eszopiclone 3 MG TABS Take 3 mg by mouth at bedtime. 09/22/14   Historical Provider, MD  fluticasone (FLONASE) 50 MCG/ACT nasal spray Place 1 spray into both nostrils daily as needed for allergies or rhinitis. 07/11/14   Lavon Paganini Angiulli, PA-C  folic acid (FOLVITE) 1 MG tablet Take 1 tablet (1 mg total) by mouth daily. 07/11/14   Lavon Paganini Angiulli, PA-C  loratadine (CLARITIN) 10 MG tablet Take 10 mg by mouth daily as needed for allergies. 07/11/14   Lavon Paganini Angiulli, PA-C  LORazepam (ATIVAN) 1 MG tablet Take 1 tablet (1 mg total) by mouth 3 (three) times daily. 09/12/14   Costin Karlyne Greenspan, MD  metoprolol succinate (TOPROL-XL) 25 MG 24 hr tablet  11/19/14   Historical Provider, MD  metroNIDAZOLE (FLAGYL) 500 MG tablet Take 1 tablet (500 mg total) by mouth  every 8 (eight) hours. 09/12/14   Costin Karlyne Greenspan, MD  mirtazapine (REMERON) 15 MG tablet Take 0.5 tablets (7.5 mg total) by mouth at bedtime. 09/12/14   McGrew, MD  Multiple Vitamins-Minerals (MULTIVITAMIN WITH MINERALS) tablet Take 1 tablet by mouth daily.    Historical Provider, MD  QUEtiapine (SEROQUEL) 25 MG tablet  11/28/14   Historical Provider, MD  saccharomyces boulardii (FLORASTOR) 250 MG capsule Take 1 capsule (250 mg total) by mouth 2 (two) times daily. 09/12/14   Costin Karlyne Greenspan, MD  SEROQUEL XR 50 MG TB24 24 hr tablet  09/29/14   Historical Provider, MD  tamsulosin (FLOMAX) 0.4 MG CAPS capsule Take 1 capsule (0.4 mg total) by mouth daily. 07/11/14   Lavon Paganini Angiulli, PA-C  traZODone (DESYREL) 50 MG tablet Take 1 tablet (50 mg total) by mouth at bedtime as needed for sleep. 09/12/14   Costin Karlyne Greenspan, MD  triamterene-hydrochlorothiazide (DYAZIDE) 37.5-25 MG per capsule  11/28/14   Historical Provider, MD  Vortioxetine HBr (BRINTELLIX) 10 MG TABS Take 10 mg by mouth daily.    Historical Provider, MD   BP 155/88 mmHg  Pulse 88  Temp(Src) 98.7 F (37.1 C) (Oral)  Resp 20  Ht 5\' 10"  (1.778 m)  Wt 205 lb (92.987 kg)  BMI 29.41 kg/m2  SpO2 97% Physical Exam  Constitutional: He is oriented to person, place, and time. He appears well-developed and well-nourished. No distress.  HENT:  Head: Normocephalic and atraumatic.  Mouth/Throat: No oropharyngeal exudate.  Poor dentition  Eyes: Conjunctivae are normal. Pupils are equal, round, and reactive to light.  Cardiovascular: Normal rate, regular rhythm and normal heart sounds.  Exam reveals no gallop and no friction rub.   No murmur heard. Pulmonary/Chest: Effort normal and breath sounds normal. No respiratory distress. He has no wheezes. He has no rales.  Abdominal: Soft. Bowel sounds are normal. He exhibits no distension. There is no tenderness. There is no rebound.  Neurological: He is alert and oriented to person, place,  and time. No cranial nerve deficit. Coordination abnormal.  Skin: He is not diaphoretic.    ED Course  Procedures (including critical care time) Labs Review Labs Reviewed  COMPREHENSIVE METABOLIC PANEL - Abnormal; Notable for the following:    Potassium 5.4 (*)    Glucose, Bld 117 (*)    AST 111 (*)    ALT 56 (*)    GFR calc non Af Amer 74 (*)    GFR calc Af Amer 86 (*)    All other components within normal limits  ETHANOL - Abnormal; Notable for the following:    Alcohol, Ethyl (B) 125 (*)    All other components within normal limits  CBC WITH DIFFERENTIAL - Abnormal; Notable for  the following:    WBC 3.8 (*)    RBC 3.58 (*)    HCT 36.0 (*)    MCV 100.6 (*)    MCH 36.6 (*)    MCHC 36.4 (*)    RDW 16.9 (*)    Basophils Relative 2 (*)    All other components within normal limits  I-STAT CHEM 8, ED - Abnormal; Notable for the following:    Potassium 3.4 (*)    Glucose, Bld 122 (*)    Calcium, Ion 1.06 (*)    Hemoglobin 12.2 (*)    HCT 36.0 (*)    All other components within normal limits  LIPASE, BLOOD    Imaging Review Ct Head Wo Contrast  12/09/2014   CLINICAL DATA:  Fall, weakness  EXAM: CT HEAD WITHOUT CONTRAST  TECHNIQUE: Contiguous axial images were obtained from the base of the skull through the vertex without intravenous contrast.  COMPARISON:  09/02/2014  FINDINGS: No skull fracture is noted. There is mucosal thickening with partial opacification bilateral maxillary sinus. The mastoid air cells are unremarkable. The ethmoid air cells and sphenoid sinuses are unremarkable.  Atherosclerotic calcifications of carotid siphon.  No acute cortical infarction. Mild cerebral atrophy is stable. Stable periventricular and patchy subcortical chronic white matter disease. No mass lesion is noted on this unenhanced scan.  IMPRESSION: No acute intracranial abnormality. There is mucosal thickening with partial opacification bilateral maxillary sinus. Stable atrophy and chronic white  matter disease. No definite acute cortical infarction.   Electronically Signed   By: Lahoma Crocker M.D.   On: 12/09/2014 15:14     EKG Interpretation None      MDM   Final diagnoses:  Alcohol abuse  Falls, initial encounter   Gait instability and dizziness may be 2/2 orthostatic hypotension given that he is on several BP meds that could precipitate this in the setting of chronic EtOH use. Will check orthostatic vital signs, CMET, lipase, CBC, head CT, EKG, EtOH level.   258PM: Ambulated patient. No gait instability observed though he did feel unsteady on his feet.   411PM: Labs notable for elevated K at 5.4, AST/ALT > 2, CBC counts also point towards chronic EtOH use (low WBC). Head CT unremarkable for acute process. Will give him more fluid and plan to recheck K. Plan to also consult TTS given his history.     Charlott Rakes, MD 12/09/14 Jeannette, MD 12/15/14 [phone]

## 2014-12-09 NOTE — BH Assessment (Addendum)
Tele Assessment Note   Joshua Henson is an 68 y.o. male who came into [facility] ED after talking to his doctor who suggested he come in and get evaluated. He states that he has been drinking steady for years and now drinks up to a 1/5th of liquor a day- everyday. His last drink was at 10am this morning he said he drank about a third of a bottle or less. Pt denied SI, HI or A/V hallucinations and also denies a history of this. He states that he needs detox and he would like help to stop drinking. He reports the longest he has been sober has been 72 days five years ago. He reports feeling stressed right now because of his declining health, issues around his work and his divorce with his wife. He currently has a girlfriend who he lives with who is supportive of him. She is not in town for the next 10 days but he lives at her house currently. Pt will need to be reevaluated in the am by psychiatry- per Catalina Pizza NP.   Axis I: 303.90 Alcohol use disorder, severe Axis II: Deferred Axis III:  Past Medical History  Diagnosis Date  . Prostate cancer 2010    External beam radiation (urol - Risa Grill, XRT Valere Dross)  . Stroke   . Depression    Axis IV: problems related to social environment Axis V: 41-50 serious symptoms  Past Medical History:  Past Medical History  Diagnosis Date  . Prostate cancer 2010    External beam radiation (urol - Risa Grill, XRT Valere Dross)  . Stroke   . Depression     Past Surgical History  Procedure Laterality Date  . None      Family History:  Family History  Problem Relation Age of Onset  . Cancer Mother     esophageal  . Hypertension Mother   . Diabetes Mother   . Cancer Father     prostate  . Heart disease Maternal Grandfather     MI  . Cancer Paternal Grandfather     prostate    Social History:  reports that he has been smoking Cigarettes.  He has a 30 pack-year smoking history. He has never used smokeless tobacco. He reports that he drinks about 3.5 oz of  alcohol per week. He reports that he does not use illicit drugs.  Additional Social History:  Alcohol / Drug Use History of alcohol / drug use?: Yes Longest period of sobriety (when/how long): 72 days 5 years ago Substance #1 Name of Substance 1: Alcohol  1 - Age of First Use: 21 1 - Amount (size/oz): 1/5th of liquor 1 - Frequency: every day  1 - Last Use / Amount: 10am this morning  CIWA: CIWA-Ar BP: 149/90 mmHg Pulse Rate: 94 Nausea and Vomiting: no nausea and no vomiting Tactile Disturbances: none Tremor: two Auditory Disturbances: not present Paroxysmal Sweats: no sweat visible Visual Disturbances: not present Anxiety: two Headache, Fullness in Head: none present Agitation: somewhat more than normal activity Orientation and Clouding of Sensorium: oriented and can do serial additions CIWA-Ar Total: 5 COWS:    PATIENT STRENGTHS: (choose at least two) Average or above average intelligence General fund of knowledge  Allergies:  Allergies  Allergen Reactions  . Celebrex [Celecoxib] Rash    Home Medications:  (Not in a hospital admission)  OB/GYN Status:  No LMP for male patient.  General Assessment Data Location of Assessment: [facility] Assessment Services ACT Assessment: Yes Is this a  Tele or Face-to-Face Assessment?: Tele Assessment Is this an Initial Assessment or a Re-assessment for this encounter?: Initial Assessment Living Arrangements: Spouse/significant other Can pt return to current living arrangement?: Yes Admission Status: Voluntary Is patient capable of signing voluntary admission?: Yes Transfer from: Home Referral Source: Self/Family/Friend     [place] Living Arrangements: Spouse/significant other Name of Psychiatrist:  (has an appointment in the next 2 weeks ) Name of Therapist: None  Education Status Highest grade of school patient has completed:  (12th)  Risk to self with the past 6 months Suicidal Ideation: No Suicidal Intent:  No Is patient at risk for suicide?: No Suicidal Plan?: No Access to Means: No What has been your use of drugs/alcohol within the last 12 months?:  (everyday- alcohol) Previous Attempts/Gestures: No How many times?:  (0) Other Self Harm Risks:  (none) Triggers for Past Attempts: None known Intentional Self Injurious Behavior: None Family Suicide History: No Recent stressful life event(s): Conflict (Comment), Divorce, Other (Comment) (health declining) Persecutory voices/beliefs?: No Depression: Yes Depression Symptoms: Despondent Substance abuse history and/or treatment for substance abuse?: Yes Suicide prevention information given to non-admitted patients: Not applicable  Risk to Others within the past 6 months Homicidal Ideation: No Thoughts of Harm to Others: No Current Homicidal Intent: No Current Homicidal Plan: No Access to Homicidal Means: Yes Describe Access to Homicidal Means:  (access to guns) Identified Victim:  (No HI) History of harm to others?: No Assessment of Violence: None Noted Violent Behavior Description:  (None) Does patient have access to weapons?: Yes (Comment) Criminal Charges Pending?: No Does patient have a court date: No  Psychosis Hallucinations: None noted Delusions: None noted  Mental Status Report Appear/Hygiene: In scrubs Eye Contact: Good Motor Activity: Unremarkable Speech: Logical/coherent Level of Consciousness: Alert Mood: Depressed Affect: Appropriate to circumstance Anxiety Level: None Thought Processes: Coherent, Relevant Judgement: Impaired Orientation: Person, Place, Time Obsessive Compulsive Thoughts/Behaviors: Unable to Assess  Cognitive Functioning Concentration: Normal Memory: Recent Intact, Remote Intact IQ: Average Insight: Fair Impulse Control: Poor Appetite: Good Sleep: Unable to Assess Vegetative Symptoms: Unable to Assess  ADLScreening [facility]) Patient's cognitive ability adequate to  safely complete daily activities?: Yes Patient able to express need for assistance with ADLs?: Yes Independently performs ADLs?: Yes (appropriate for developmental age)  Prior Inpatient Therapy Prior Inpatient Therapy:  (detox)  Prior Outpatient Therapy Prior Outpatient Therapy:  (unknown if ever)  ADL Screening (condition at time of admission) Patient's cognitive ability adequate to safely complete daily activities?: Yes Is the patient deaf or have difficulty hearing?: Yes (hard of hearing ) Does the patient have difficulty seeing, even when wearing glasses/contacts?: No Does the patient have difficulty concentrating, remembering, or making decisions?: No Patient able to express need for assistance with ADLs?: Yes Does the patient have difficulty dressing or bathing?: Yes Independently performs ADLs?: Yes (appropriate for developmental age) Does the patient have difficulty walking or climbing stairs?: Yes Weakness of Legs: Both Weakness of Arms/Hands: Both  Home Assistive Devices/Equipment Home Assistive Devices/Equipment: Cane (specify quad or straight)    Abuse/Neglect Assessment (Assessment to be complete while patient is alone) Physical Abuse: Denies Verbal Abuse: Denies Sexual Abuse: Denies Exploitation of patient/patient's resources: Denies Self-Neglect: Denies     Regulatory affairs officer (For Healthcare) Does patient have an advance directive?: Yes Type of Advance Directive: Living will    Additional Information 1:1 In Past 12 Months?: No CIRT Risk: No Elopement Risk: No Does patient have medical clearance?: No  Disposition:  Disposition Initial Assessment Completed for this Encounter: Yes Disposition of Patient: Other dispositions Other disposition(s): Other (Comment) (Reevaluate in the am)  Arnol Mcgibbon 12/09/2014 5:11 PM   Bedelia Person, M.S., LPCA, [facility] Licensed Professional Counselor Associate  Triage Specialist  [facility]  Therapeutic Triage Services Phone: [phone] Fax: [phone]

## 2014-12-09 NOTE — ED Notes (Signed)
Pt. Stated, I fell this morning for no reason.  I've been weak and had a little bit of chest pain

## 2014-12-10 DIAGNOSIS — Z043 Encounter for examination and observation following other accident: Secondary | ICD-10-CM | POA: Diagnosis not present

## 2014-12-10 LAB — BASIC METABOLIC PANEL
ANION GAP: 11 (ref 5–15)
BUN: 10 mg/dL (ref 6–23)
CALCIUM: 8.5 mg/dL (ref 8.4–10.5)
CO2: 23 mmol/L (ref 19–32)
Chloride: 103 mEq/L (ref 96–112)
Creatinine, Ser: 1.11 mg/dL (ref 0.50–1.35)
GFR calc Af Amer: 77 mL/min — ABNORMAL LOW (ref >90)
GFR, EST NON AFRICAN AMERICAN: 67 mL/min — AB (ref >90)
GLUCOSE: 117 mg/dL — AB (ref 70–99)
POTASSIUM: 3 mmol/L — AB (ref 3.5–5.1)
SODIUM: 137 mmol/L (ref 135–145)

## 2014-12-10 LAB — CLOSTRIDIUM DIFFICILE BY PCR: CDIFFPCR: POSITIVE — AB

## 2014-12-10 MED ORDER — LORAZEPAM 1 MG PO TABS
1.0000 mg | ORAL_TABLET | Freq: Three times a day (TID) | ORAL | Status: DC | PRN
  Administered 2014-12-10 – 2014-12-11 (×2): 1 mg via ORAL
  Filled 2014-12-10 (×2): qty 1

## 2014-12-10 MED ORDER — METRONIDAZOLE 500 MG PO TABS
500.0000 mg | ORAL_TABLET | Freq: Three times a day (TID) | ORAL | Status: DC
  Administered 2014-12-10 – 2014-12-11 (×3): 500 mg via ORAL
  Filled 2014-12-10 (×3): qty 1

## 2014-12-10 MED ORDER — POTASSIUM CHLORIDE CRYS ER 20 MEQ PO TBCR
40.0000 meq | EXTENDED_RELEASE_TABLET | Freq: Once | ORAL | Status: AC
  Administered 2014-12-10: 40 meq via ORAL
  Filled 2014-12-10: qty 2

## 2014-12-10 MED ORDER — SODIUM CHLORIDE 0.9 % IV BOLUS (SEPSIS)
500.0000 mL | Freq: Once | INTRAVENOUS | Status: AC
  Administered 2014-12-10: 500 mL via INTRAVENOUS

## 2014-12-10 NOTE — BH Assessment (Signed)
Spoke with ED Nurse Kandee Keen  to inform him that patient has been accepted to [facility] for inpatient treatment. This Probation officer received a telephone call from [place] at [facility] confirming acceptance. Nursing reports should be called to [phone] accepting Doctor is Dr.Kohl.  EDP Dr. Vanita Panda has been notified of pt's acceptance to O/[facility].  Shaune Pollack, MS, [place] Assessment Counselor

## 2014-12-10 NOTE — ED Provider Notes (Addendum)
Filed Vitals:   12/10/14 0623  BP: 136/83  Pulse: 97  Temp: 99 F (37.2 C)  Resp: 18   Pt awaiting psych assessment.  Will recheck K this am.  16:12 Pt had one episode of diarrhea this am.  Has hx of c.diff that was treated a couple of months ago.  Has +c.dif today.  Will start flagyl .  Malvin Johns, MD 12/10/14 4383  Malvin Johns, MD 12/10/14 [phone]

## 2014-12-10 NOTE — Progress Notes (Addendum)
Pt requesting detox, clinicals faxed to [facility] who reports bed availability. Will continue to seek placement.  Charlene Brooke, MSW  Social Worker [phone]

## 2014-12-10 NOTE — ED Notes (Signed)
Pt awakened for lab draw

## 2014-12-10 NOTE — ED Notes (Signed)
Pt audibly snoring.

## 2014-12-10 NOTE — ED Notes (Signed)
Spoke to [employer] at [facility] and she advises that pt [facility] him and told them the patient has c-diff. Due to girlfriend doing this [facility] will not [facility] patient until he provides a stool sample verifying that he does not have the infection. Patient states he had c diff months ago and was treated. States that he is not having any diarrhea or other symptoms.

## 2014-12-10 NOTE — ED Notes (Signed)
Spoke to [place] at [facility]. He was inquiring about pt. Will call back when talks to doctor

## 2014-12-10 NOTE — ED Provider Notes (Addendum)
Patient accepted to [facility]. Dr. Dareen Piano is the accepting physician. Patient is not under IVC, and will be d/c.   Joshua Muskrat, MD 12/10/14 1713   5:57 PM Patient is now not accepted at [facility], and is now awaiting new placement.  Joshua Muskrat, MD 12/10/14 Carollee Massed

## 2014-12-10 NOTE — [facility] Counselor (Signed)
Pt referral faxed out to [facility], [facility], [facility], and [facility].   Bedelia Person, M.S., LPCA, [facility] Licensed Professional Counselor Associate  Triage Specialist  [facility]  Therapeutic Triage Services Phone: [phone] Fax: [phone]

## 2014-12-10 NOTE — Progress Notes (Signed)
Pt was discussed with me via Cyril Mourning and Otila Kluver from TTS at 5:05PM on 12/09/2014. It has been determined that he meets criteria and we have been working on placement as the evening of 12/22 at a facility for detox/rehab that can also handle his medical concerns. [facility] may be an option but [facility] TTS is exploring all options at this time.  Benjamine Mola, FNP-BC 12/09/2014    05:05PM

## 2014-12-10 NOTE — ED Notes (Signed)
Pt given fresh paper scrubs

## 2014-12-10 NOTE — ED Notes (Signed)
bh has called pa erin and informed her that they are seeking placement for the pt to detox.

## 2014-12-10 NOTE — BH Assessment (Signed)
Spoke with Cece from [facility] intake/admission department pt is declined at [facility] per nursing supervisor Mariana Arn. Pt has C-Diff which is exclusionary criteria and pt is not appropriate for admission to [facility] with this condition. ED nurse is aware of this. EDP Vanita Panda has been notified of change in disposition.   Shaune Pollack, MS, [place] Assessment Counselor

## 2014-12-10 NOTE — ED Notes (Signed)
Asked Patient if he would like to take a shower.  Patient shower he would this afternoon.

## 2014-12-11 ENCOUNTER — Telehealth: Payer: Self-pay | Admitting: *Deleted

## 2014-12-11 DIAGNOSIS — Z043 Encounter for examination and observation following other accident: Secondary | ICD-10-CM | POA: Diagnosis not present

## 2014-12-11 MED ORDER — VANCOMYCIN HCL 125 MG PO CAPS: 125.0000 mg | ORAL_CAPSULE | Freq: Four times a day (QID) | ORAL | Status: DC

## 2014-12-11 MED ORDER — POTASSIUM CHLORIDE CRYS ER 20 MEQ PO TBCR
20.0000 meq | EXTENDED_RELEASE_TABLET | Freq: Every day | ORAL | Status: DC
Start: 2014-12-11 — End: 2016-01-19

## 2014-12-11 MED ORDER — CHLORDIAZEPOXIDE HCL 25 MG PO CAPS: ORAL_CAPSULE | ORAL | Status: DC

## 2014-12-11 NOTE — ED Notes (Signed)
The pt has been awake for the past hour going in and out of the br.  He reports that he is voiding.  He just requested some ativan he holds out his hands and he has a tremor.  Steady gait speech normal.  No distress

## 2014-12-11 NOTE — ED Notes (Signed)
Pt still has a tremor  Ativan given as scheduled in the mar

## 2014-12-11 NOTE — ED Provider Notes (Addendum)
Patient was requesting to leave.  He has a companion that has come to pick him up.  He is here voluntarily.  He states that he can take his antibiotics and will take Librium at home.  I have gone over instructions for Librium taper in 2 week course of by mouth Vang.  By mouth Flagyl will not be given due to his history of alcohol abuse.  He has a slight tremor on exam, though is ambulating without difficulty, alert and oriented.  I've given patient and companion.  Strict return precautions to the ED.  Patient was specifically told that he if he feels worse and there is no one at home.  He needs to call 911 and come back to the emergency room.  He understands and agrees.  SW has had much difficulty obtaining the vancomycin and a reasonable cost.  At this point the out-of-pocket cost.  Per patient, with his insurance is going to be upwards of $700.  I do not think that he will fill this.  At this point, I feel like his best option would be by mouth Flagyl.  He is aware that he is not supposed to drink alcohol with this medication.  Social work will call in a prescription for 300 mg of Flagyl 3 times a day for 14 days..   1. Alcohol abuse   2. Falls, initial encounter   3. Clostridium difficile diarrhea        Ernestina Patches, MD 12/11/14 7893  Ernestina Patches, MD 12/11/14 1232

## 2014-12-11 NOTE — ED Notes (Signed)
Pt up to the br asking for coffee.  Info given not allowed this time of night. Ok with response   Back to bed

## 2014-12-11 NOTE — Discharge Instructions (Signed)
Clostridium Difficile Infection °Clostridium difficile (C. difficile) is a bacteria found in the intestinal tract or colon. Under certain conditions, it causes diarrhea and sometimes severe disease. The severe form of the disease is known as pseudomembranous colitis (often called C. difficile colitis). This disease can damage the lining of the colon or cause the colon to become enlarged (toxic megacolon). °CAUSES °Your colon normally contains many different bacteria, including C. difficile. The balance of bacteria in your colon can change during illness. This is especially true when you take antibiotic medicine. Taking antibiotics may allow the C. difficile to grow, multiply excessively, and make a toxin that then causes illness. The elderly and people with certain medical conditions have a greater risk of getting C. difficile infections. °SYMPTOMS °· Watery diarrhea. °· Fever. °· Fatigue. °· Loss of appetite. °· Nausea. °· Abdominal swelling, pain, or tenderness. °· Dehydration. °DIAGNOSIS °Your symptoms may make your caregiver suspect a C. difficile infection, especially if you have used antibiotics in the preceding weeks. However, there are only 2 ways to know for certain whether you have a C. difficile infection: °· A lab test that finds the toxin in your stool. °· The specific appearance of an abnormality (pseudomembrane) in your colon. This can only be seen by doing a sigmoidoscopy or colonoscopy. These procedures involve passing an instrument through your rectum to look at the inside of your colon. °Your caregiver will help determine if these tests are necessary. °TREATMENT °· Most people are successfully treated with one of two specific antibiotics, usually given by mouth. Other antibiotics you are receiving are stopped if possible. °· Intravenous (IV) fluids and correction of electrolyte imbalance may be necessary. °· Rarely, surgery may be needed to remove the infected part of the intestines. °· Careful  hand washing by you and your caregivers is important to prevent the spread of infection. In the hospital, your caregivers may also put on gowns and gloves to prevent the spread of the C. difficile bacteria. Your room is also cleaned regularly with a solution containing bleach or a product that is known to kill C. difficile. °HOME CARE INSTRUCTIONS °· Drink enough fluids to keep your urine clear or pale yellow. Avoid milk, caffeine, and alcohol. °· Ask your caregiver for specific rehydration instructions. °· Try eating small, frequent meals rather than large meals. °· Take your antibiotics as directed. Finish them even if you start to feel better. °· Do not use medicines to slow diarrhea. This could delay healing or cause complications. °· Wash your hands thoroughly after using the bathroom and before preparing food. °· Make sure people who live with you wash their hands often, too. °· Carefully disinfect all surfaces with a product that contains chlorine bleach. °SEEK MEDICAL CARE IF: °· Diarrhea persists longer than expected or recurs after completing your course of antibiotic treatment for the C. difficile infection. °· You have trouble staying hydrated. °SEEK IMMEDIATE MEDICAL CARE IF: °· You develop a new fever. °· You have increasing abdominal pain or tenderness. °· There is blood in your stools, or your stools are dark black and tarry. °· You cannot hold down food or liquids. °MAKE SURE YOU: °· Understand these instructions. °· Will watch your condition. °· Will get help right away if you are not doing well or get worse. °Document Released: 09/14/2005 Document Revised: 04/21/2014 Document Reviewed: 05/13/2011 °ExitCare® Patient Information ©2015 [employer]. This information is not intended to replace advice given to you by your health care provider. Make sure you   discuss any questions you have with your health care provider.   Alcohol Withdrawal Anytime drug use is interfering with normal living  activities it has become abuse. This includes problems with family and friends. Psychological dependence has developed when your mind tells you that the drug is needed. This is usually followed by physical dependence when a continuing increase of drugs are required to get the same feeling or "high." This is known as addiction or chemical dependency. A person's risk is much higher if there is a history of chemical dependency in the family. Mild Withdrawal Following Stopping Alcohol, When Addiction or Chemical Dependency Has Developed When a person has developed tolerance to alcohol, any sudden stopping of alcohol can cause uncomfortable physical symptoms. Most of the time these are mild and consist of tremors in the hands and increases in heart rate, breathing, and temperature. Sometimes these symptoms are associated with anxiety, panic attacks, and bad dreams. There may also be stomach upset. Normal sleep patterns are often interrupted with periods of inability to sleep (insomnia). This may last for 6 months. Because of this discomfort, many people choose to continue drinking to get rid of this discomfort and to try to feel normal. Severe Withdrawal with Decreased or No Alcohol Intake, When Addiction or Chemical Dependency Has Developed About five percent of alcoholics will develop signs of severe withdrawal when they stop using alcohol. One sign of this is development of generalized seizures (convulsions). Other signs of this are severe agitation and confusion. This may be associated with believing in things which are not real or seeing things which are not really there (delusions and hallucinations). Vitamin deficiencies are usually present if alcohol intake has been long-term. Treatment for this most often requires hospitalization and close observation. Addiction can only be helped by stopping use of all chemicals. This is hard but may save your life. With continual alcohol use, possible outcomes are usually  loss of self respect and esteem, violence, and death. Addiction cannot be cured but it can be stopped. This often requires outside help and the care of professionals. Treatment centers are listed in the yellow pages under Cocaine, Narcotics, and Alcoholics Anonymous. Most hospitals and clinics can refer you to a specialized care center. It is not necessary for you to go through the uncomfortable symptoms of withdrawal. Your caregiver can provide you with medicines that will help you through this difficult period. Try to avoid situations, friends, or drugs that made it possible for you to keep using alcohol in the past. Learn how to say no. It takes a long period of time to overcome addictions to all drugs, including alcohol. There may be many times when you feel as though you want a drink. After getting rid of the physical addiction and withdrawal, you will have a lessening of the craving which tells you that you need alcohol to feel normal. Call your caregiver if more support is needed. Learn who to talk to in your family and among your friends so that during these periods you can receive outside help. Alcoholics Anonymous (AA) has helped many people over the years. To get further help, contact AA or call your caregiver, counselor, or clergyperson. Al-Anon and Alateen are support groups for friends and family members of an alcoholic. The people who love and care for an alcoholic often need help, too. For information about these organizations, check your phone directory or call a local alcoholism treatment center.  SEEK IMMEDIATE MEDICAL CARE IF:   You have a  seizure.  You have a fever.  You experience uncontrolled vomiting or you vomit up blood. This may be bright red or look like black coffee grounds.  You have blood in the stool. This may be bright red or appear as a black, tarry, bad-smelling stool.  You become lightheaded or faint. Do not drive if you feel this way. Have someone else drive you or  call 469 for help.  You become more agitated or confused.  You develop uncontrolled anxiety.  You begin to see things that are not really there (hallucinate). Your caregiver has determined that you completely understand your medical condition, and that your mental state is back to normal. 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 Document Released: 09/14/2005 Document Revised: 02/27/2012 Document Reviewed: 07/23/2008 [facility] Patient Information 2015 [place], [place]. This information is not intended to replace advice given to you by your health care provider. Make sure you discuss any questions you have with your health care provider.   Emergency Department Resource Guide 1) Find a Doctor and Pay Out of Pocket Although you won't have to find out who is covered by your insurance plan, it is a good idea to ask around and get recommendations. You will then need to call the office and see if the doctor you have chosen will accept you as a new patient and what types of options they offer for patients who are self-pay. Some doctors offer discounts or will set up payment plans for their patients who do not have insurance, but you will need to ask so you aren't surprised when you get to your appointment.  2) Contact Your Local Health Department Not all health departments have doctors that can see patients for sick visits, but many do, so it is worth a call to see if yours does. If you don't know where your local health department is, you can check in your phone book. The CDC also has a tool to help you locate your state's health department, and many state websites also have listings of all of their local health departments.  3) Find a [facility] If your illness is not likely to be very severe  or complicated, you may want to try a walk in clinic. These are popping up all over the country in pharmacies, drugstores, and shopping centers. They're usually staffed by nurse practitioners or physician assistants that have been trained to treat common illnesses and complaints. They're usually fairly quick and inexpensive. However, if you have serious medical issues or chronic medical problems, these are probably not your best option.  No Primary Care Doctor: - Call Health Connect at  [phone] - they can help you locate a primary care doctor that  accepts your insurance, provides certain services, etc. - Physician Referral Service- [phone]  Chronic Pain Problems: Organization         Address  Phone   Notes  [facility]  [phone] Patients need to be referred by their primary care doctor.   Medication Assistance: Organization         Address  Phone   Notes  [facility] Medication [facility] [address] [place], [place] [address] [phone] --Must be a resident of [facility] -- Must have NO  insurance coverage whatsoever (no Medicaid/ Medicare, etc.) -- The pt. MUST have a primary care doctor that directs their care regularly and follows them in the community   [facility]ist  [phone]   [employer]  [phone]    Agencies that provide inexpensive medical care: Organization         Address  Phone   Notes  [place]  [phone]   [facility]    [phone]   [facility] [place], [place] [address] [phone]   [place] [address] [place] [phone]   Planned Parenthood    [phone]   [facility]    [phone]   [place] and [address], [place] Phone:  [phone], Fax:  [phone] Hours of Operation:  9 am - 6 pm, M-F.  Also accepts  Medicaid/Medicare and self-pay.  [facility] for [place] of [place], [address], [place] Phone: [phone], Fax: [phone]. Hours of Operation:  8:30 am - 5:30 pm, M-F.  Also accepts Medicaid and self-pay.  [facility] [place] [address], [place] Phone: [phone]   [place], [place], [place] [phone], Ext. [phone] Mondays & Thursdays: 7-9 AM.  First 15 patients are seen on a first come, first serve basis.    [place] Providers:  Organization         Address  Phone   Notes  [facility] [address], [place] [phone] Also accepts self-pay patients.  [facility] [address], [place]  [phone]   [place], [address], [place] [phone]   [facility] [address] [place] [phone]   Lucianne Lei [address], [place]   [phone] Only accepts [place] Access [place] patients after they have their name applied to their card.   Self-Pay (no insurance) in [facility]:  Organization         Address  Phone   Notes  Sickle Cell Patients, [facility] Internal Medicine [place] [phone]   [facility] Urgent Care [place] [phone]   [facility] [place]  [place], [place], [place] [phone]   [facility]/Dr. Osei-Bonsu  [address] [place] or [address], [place] [phone] Phone number for both [place] and [place] locations is the same.  [facility] [address] [place] [phone]   [facility] [address], [place] or [address] [phone]   [facility] [address] [place] [phone], phone; [phone], fax Sees patients 1st and 3rd Saturday of every month.  Must not qualify for public or private insurance (i.e. Medicaid, Medicare, [place] Health Choice, Veterans' Benefits)  Household income should be no more than 200% of the poverty level The clinic cannot treat you if you are pregnant or think you are pregnant  Sexually transmitted diseases are not treated at the clinic.    Dental Care: Organization         Address  Phone  Notes  [facility]  Department of [facility] [address] [place] [phone] Accepts children up to age 34 who are enrolled in [place] or [place]; pregnant women with a Medicaid card; and children who have applied for Medicaid or [place] Health Choice, but were declined, whose parents can pay a reduced fee at time of service.  [facility] Department of [facility]  [address], [place] [phone] Accepts children up to age 69 who are enrolled in [place] or [place]; pregnant women with a Medicaid card; and children who have applied for Medicaid or [place] Health Choice, but were declined, whose parents can pay a reduced fee at time of service.  [place] Adult Dental Access PROGRAM  [place] [phone] Patients are seen by appointment only. Walk-ins are not accepted. [place] will see patients 6 years of age and older. Monday - Tuesday (8am-5pm) Most Wednesdays (8:30-5pm) $30 per visit, cash only  [facility] Adult Dental Access PROGRAM  [address], [facility] [phone] Patients are seen by appointment only. Walk-ins are not accepted. [place] will see patients 45 years of age and older. One Wednesday Evening (Monthly: Volunteer Based).  $30 per visit, cash only  [place]  [phone] for adults; Children under age 87, call Graduate Pediatric Dentistry at [phone]. Children aged  2-14, please call [phone] to request a pediatric application.  Dental services are provided in all areas of dental care including fillings, crowns and bridges, complete and partial dentures, implants, gum treatment, root canals, and extractions. Preventive care is also provided. Treatment is provided to both adults and children. Patients are selected via a lottery and there is often a waiting list.   [facility] [address] [place]  [phone] [email or web address]   [facility] [address] [place], [place] [phone], Ext. [phone] Second and Fourth Thursday of each month, opens at 6:30 AM; Clinic ends at 9 AM.  Patients are seen on a first-come first-served basis, and a limited number are seen during each clinic.   [facility]  [address] Hillard Danker [place], [place] [phone]   Eligibility Requirements You must have lived in [place], [place], or [place] counties for at least the last three months.   You cannot be eligible for state or federal sponsored [employer], including [employer], [place], or [employer].   You generally cannot be eligible for healthcare insurance through your employer.    How to apply: Eligibility screenings are held every Tuesday and Wednesday afternoon from 1:00 pm until 4:00 pm. You do not need an appointment for the interview!  [facility] [address], [place], [place]   [place]  [place] Department  [place]  [phone]    Behavioral Health Resources in the Community: Intensive Outpatient Programs Organization         Address  Phone  Notes  [place]. [address] [place], [place] [phone]   [facility] Outpatient [address], [place], [place]   ADS: Alcohol & Drug Svcs [address],  [place], [place]   [place] [address],  [place], [place] or [phone]   Substance Abuse Resources Organization  Address  Phone  Notes  Alcohol and Drug Services  [phone]   [place]  [phone]   [facility]  [phone]   Chinita Pester  [phone]   Residential & Outpatient Substance Abuse Program  [phone]   Psychological Services Organization         Address  Phone  Notes  [facility] [place]  [place]  [phone]   [place] [address], [place] or [phone]    Mobile Crisis Teams Organization         Address  Phone  Notes  [facility], Mobile Crisis Care Unit  [phone]   [facility]  [address] [place], [place]   [place] [address] [place] [phone]    Self-Help/Support Groups Organization         Address  Phone             Notes  [place]. of [place] - variety of support groups  [place] Call for more information  Narcotics Anonymous (NA), [facility] [address], [employer] [place]  2 meetings at this location   Special educational needs teacher         Address  Phone  Notes  ASAP Residential Treatment [place],    [place]  [phone]   [facility]  [address], [place] 415830, [place], [place]   [place], [place] [phone] Admissions: 8am-3pm M-F  Incentives Substance [place] [address]    [place], [place] [phone]   [facility] [address] [place], [place], [place]   [facility] [address]  [place], [place]   [facility] Programs - Intensive Outpatient [address] Kristeen Mans [address], [place], [place]   [facility]  ([place].) [place].,  [place], [place] [phone] or [phone]   [facility]idential Treatment Services (RTS) [address], [place], [place] Accepts Medicaid  [facility] [place] [address],  [place] [phone] Substance Abuse/Addiction Treatment   [facility] Organization         Address  Phone  Notes  [facility]  [phone]   Domenic Schwab, PhD [address] [place]lis [place], [place]   [phone] or [phone]   [place], [place] [phone]   [facility] [address], [place], [place] [phone] Insurance/Medicaid/sponsorship through [facility] and Families [address]                                    [place], [place] [phone] [place] [address], [place] [phone]    Dr. Adele Schilder  [phone]   [facility] Dept. 1) [address], [place] 2) [place] 3)  [place] [address], [place] [phone]  [phone]   [place] [phone] or [phone] (After Hours)

## 2014-12-11 NOTE — ED Notes (Signed)
Pt belongings and valuables returned to him at discharge

## 2014-12-11 NOTE — ED Notes (Signed)
Pt up to the br.  Asking for coffee again

## 2014-12-11 NOTE — ED Notes (Signed)
Ativan given per pts request.  2000 dose not given per off going rn.  He did not think the pt needed it

## 2014-12-11 NOTE — Telephone Encounter (Signed)
Pharmacy called because pt needed prior auth for Vancomycin prescription. NCM called [employer] to obtain prior auth for medication.

## 2014-12-11 NOTE — ED Notes (Signed)
Patient wheeled in wheelchair to the Emergency Room Entrance.  Patient requested to wait alone for his ride.  Patient was left with wheelchair locked.

## 2014-12-11 NOTE — Telephone Encounter (Signed)
NCM unable to obtain prior auth; stayed on phone with [place] for over an hour.  MD changed medication to Flagyl 500mg  tid.  [facility].

## 2015-01-19 DIAGNOSIS — R000 Tachycardia, unspecified: Secondary | ICD-10-CM | POA: Diagnosis not present

## 2015-01-19 DIAGNOSIS — H8109 Meniere's disease, unspecified ear: Secondary | ICD-10-CM | POA: Diagnosis not present

## 2015-02-12 ENCOUNTER — Ambulatory Visit (INDEPENDENT_AMBULATORY_CARE_PROVIDER_SITE_OTHER): Payer: Medicare Other | Admitting: Cardiovascular Disease

## 2015-02-12 ENCOUNTER — Encounter: Payer: Self-pay | Admitting: Cardiovascular Disease

## 2015-02-12 VITALS — BP 104/80 | HR 108 | Ht 70.0 in | Wt 228.0 lb

## 2015-02-12 DIAGNOSIS — R55 Syncope and collapse: Secondary | ICD-10-CM | POA: Diagnosis not present

## 2015-02-12 DIAGNOSIS — I471 Supraventricular tachycardia: Secondary | ICD-10-CM | POA: Diagnosis not present

## 2015-02-12 DIAGNOSIS — R000 Tachycardia, unspecified: Secondary | ICD-10-CM | POA: Insufficient documentation

## 2015-02-12 DIAGNOSIS — I10 Essential (primary) hypertension: Secondary | ICD-10-CM

## 2015-02-12 NOTE — Progress Notes (Signed)
Cardiology Office Note   Date:  02/12/2015   ID:  BERK PILOT, DOB September 24, 1946, MRN [id number]  PCP:  Joshua Hare, MD  Cardiologist:   Joshua Fredrickson Wonda Cheng, MD   Chief Complaint  Patient presents with  . Tachycardia   Problem List 1. Sinus tachycardia 2. Menieres disease 3. C.diff colitis 4. Hypertension 5. alcohyolism 6. Stroke 7. Hyperlipidemia   History of Present Illness: Joshua Henson is a 69 y.o. male who presents for evaluation of resting tachycardia.  He was seen with his girlfriend , Joshua Henson.  For the past month, his HR has been elevated.   He has had quite variable HR and BP readings.   No CP , does not get any regular exercise.   Past Medical History  Diagnosis Date  . Prostate cancer 2010    External beam radiation (urol - Joshua Henson, XRT Joshua Henson)  . Stroke   . Depression   . Tachycardia   . Chronic kidney disease   . Hypertension   . Insomnia     Past Surgical History  Procedure Laterality Date  . None       Current Outpatient Prescriptions  Medication Sig Dispense Refill  . aspirin 81 MG EC tablet Take 1 tablet (81 mg total) by mouth daily. 30 tablet 12  . atorvastatin (LIPITOR) 10 MG tablet Take 1 tablet (10 mg total) by mouth daily. 30 tablet 1  . carvedilol (COREG) 12.5 MG tablet Take 12.5 mg by mouth 2 (two) times daily.  0  . CIALIS 20 MG tablet Take 20 mg by mouth as needed.  0  . folic acid (FOLVITE) 1 MG tablet Take 1 tablet (1 mg total) by mouth daily. 30 tablet 1  . loratadine (CLARITIN) 10 MG tablet Take 10 mg by mouth daily as needed for allergies.    Marland Kitchen LORazepam (ATIVAN) 1 MG tablet Take 1 tablet (1 mg total) by mouth 3 (three) times daily. 15 tablet 0  . metoprolol succinate (TOPROL-XL) 25 MG 24 hr tablet   0  . potassium chloride SA (K-DUR,KLOR-CON) 20 MEQ tablet Take 1 tablet (20 mEq total) by mouth daily. 3 tablet 0  . QUEtiapine (SEROQUEL) 25 MG tablet Take 100 mg by mouth at bedtime.    . tamsulosin (FLOMAX) 0.4 MG CAPS  capsule   0  . triamterene-hydrochlorothiazide (DYAZIDE) 37.5-25 MG per capsule Take 1 capsule by mouth daily.  0   No current facility-administered medications for this visit.    Allergies:   Cymbalta; Lexapro; Trazodone and nefazodone; Wellbutrin; and Celebrex    Social History:  The patient  reports that he has been smoking Cigarettes.  He has a 30 pack-year smoking history. He has never used smokeless tobacco. He reports that he drinks about 3.5 oz of alcohol per week. He reports that he does not use illicit drugs.   Family History:  The patient's family history includes Cancer in his father, mother, and paternal grandfather; Diabetes in his mother; Heart disease in his maternal grandfather; Hypertension in his mother.    ROS:  Please see the history of present illness.    Review of Systems: Constitutional:  denies fever, chills, diaphoresis, appetite change and fatigue.  HEENT: denies photophobia, eye pain, redness, hearing loss, ear pain, congestion, sore throat, rhinorrhea, sneezing, neck pain, neck stiffness and tinnitus.  Respiratory: denies SOB, DOE, cough, chest tightness, and wheezing.  Cardiovascular: admits to  palpitations    Gastrointestinal: admits to   Diarrhea with his C.  Diff .  Genitourinary: denies dysuria, urgency, frequency, hematuria, flank pain and difficulty urinating.  Musculoskeletal: denies  myalgias, back pain, joint swelling, arthralgias and gait problem.   Skin: denies pallor, rash and wound.  Neurological: denies dizziness, seizures, syncope, weakness, light-headedness, numbness and headaches.   Hematological: denies adenopathy, easy bruising, personal or family bleeding history.  Psychiatric/ Behavioral: denies suicidal ideation, mood changes, confusion, nervousness, sleep disturbance and agitation.       All other systems are reviewed and negative.    PHYSICAL EXAM: VS:  BP 104/80 mmHg  Pulse 108  Ht 5\' 10"  (1.778 m)  Wt 228 lb (103.42 kg)   BMI 32.71 kg/m2 , BMI Body mass index is 32.71 kg/(m^2). GEN: Well nourished, well developed, in no acute distress HEENT: normal Neck: no JVD, carotid bruits, or masses Cardiac: RRR; no murmurs, rubs, or gallops,no edema  Respiratory:  clear to auscultation bilaterally, normal work of breathing GI: soft, nontender, nondistended, + BS MS: no deformity or atrophy Skin: warm and dry, no rash Neuro:  Strength and sensation are intact Psych: normal   EKG:  EKG is ordered today. The ekg ordered today demonstrates Sinus tachycardia at 108.  Possible inf. MI. No ST or T wave changes.    Recent Labs: 09/03/2014: Pro B Natriuretic peptide (BNP) 12.6; TSH 0.979 09/09/2014: Magnesium 2.0 12/09/2014: ALT 56*; Hemoglobin 13.1; Platelets 172 12/10/2014: BUN 10; Creatinine 1.11; Potassium 3.0*; Sodium 137    Lipid Panel    Component Value Date/Time   CHOL 118 07/04/2014 2358   TRIG 77 07/04/2014 2358   HDL 57 07/04/2014 2358   CHOLHDL 2.1 07/04/2014 2358   VLDL 15 07/04/2014 2358   LDLCALC 46 07/04/2014 2358      Wt Readings from Last 3 Encounters:  02/12/15 228 lb (103.42 kg)  09/08/14 208 lb 12.4 oz (94.7 kg)  07/09/14 203 lb 9.6 oz (92.352 kg)      Other studies Reviewed: Additional studies/ records that were reviewed today include: . Review of the above records demonstrates:    ASSESSMENT AND PLAN:  1.   Sinus tachycardia: Mr. Joshua Henson has had an echo card gram last year that showed normal left ventricle systolic function. He has a long history of heavy alcohol use and I would like to repeat his echocardiogram 10 sure that his LV function is normal.  All the symptoms are completely consistent with chronic and heavy L Joshua Henson use. I suspect that he has a very high adrenergic  state. He also probably is somewhat chronically dehydrated.  we discussed the fact that cutting back on his alcohol intake would likely improve his sinus tachycardia. I've also advised him to try drinking a V8  juice on occasion.   I do not think that he has anything intrinsically wrong with his heart  But should be noted that he certainly is at risk for an alcoholic cardiomyopathy.    I will Joshua Henson see him on an as-needed basis assuming that his echo card gram is normal. If he already has developed severe LV dysfunction, we will need to see him for further management of his CHF.   Current medicines are reviewed at length with the patient today.  The patient does not have concerns regarding medicines.  The following changes have been made:  no change   Disposition:   FU with me as needed     Signed, Kippy Gohman, Wonda Cheng, MD  02/12/2015 3:08 PM    [facility] [address], [place], [place]  [address] Phone: [phone]; Fax: [phone]

## 2015-02-12 NOTE — Patient Instructions (Signed)
Your physician has requested that you have an echocardiogram. Echocardiography is a painless test that uses sound waves to create images of your heart. It provides your doctor with information about the size and shape of your heart and how well your heart's chambers and valves are working. This procedure takes approximately one hour. There are no restrictions for this procedure.  Your physician recommends that you continue on your current medications as directed. Please refer to the Current Medication list given to you today.  Your physician recommends that you schedule a follow-up appointment in: 3 months with Dr. Acie Fredrickson.

## 2015-02-18 ENCOUNTER — Other Ambulatory Visit (HOSPITAL_COMMUNITY): Payer: Self-pay

## 2015-02-20 ENCOUNTER — Other Ambulatory Visit (HOSPITAL_COMMUNITY): Payer: Self-pay

## 2015-02-26 ENCOUNTER — Other Ambulatory Visit (HOSPITAL_COMMUNITY): Payer: Self-pay

## 2015-02-27 ENCOUNTER — Encounter (HOSPITAL_COMMUNITY): Payer: Self-pay | Admitting: Cardiovascular Disease

## 2015-03-02 DIAGNOSIS — F332 Major depressive disorder, recurrent severe without psychotic features: Secondary | ICD-10-CM | POA: Diagnosis not present

## 2015-04-02 ENCOUNTER — Emergency Department (HOSPITAL_COMMUNITY): Payer: Medicare Other

## 2015-04-02 ENCOUNTER — Encounter (HOSPITAL_COMMUNITY): Payer: Self-pay | Admitting: Emergency Medicine

## 2015-04-02 ENCOUNTER — Observation Stay (HOSPITAL_COMMUNITY): Payer: Medicare Other

## 2015-04-02 ENCOUNTER — Inpatient Hospital Stay (HOSPITAL_COMMUNITY)
Admission: EM | Admit: 2015-04-02 | Discharge: 2015-04-08 | DRG: 897 | Disposition: A | Discharge status: Skilled Nursing Facility | Payer: Medicare Other | Attending: Internal Medicine | Admitting: Internal Medicine

## 2015-04-02 DIAGNOSIS — R5383 Other fatigue: Secondary | ICD-10-CM

## 2015-04-02 DIAGNOSIS — R251 Tremor, unspecified: Secondary | ICD-10-CM | POA: Diagnosis not present

## 2015-04-02 DIAGNOSIS — I129 Hypertensive chronic kidney disease with stage 1 through stage 4 chronic kidney disease, or unspecified chronic kidney disease: Secondary | ICD-10-CM | POA: Diagnosis present

## 2015-04-02 DIAGNOSIS — R5381 Other malaise: Secondary | ICD-10-CM | POA: Diagnosis present

## 2015-04-02 DIAGNOSIS — Z79899 Other long term (current) drug therapy: Secondary | ICD-10-CM

## 2015-04-02 DIAGNOSIS — R7401 Elevation of levels of liver transaminase levels: Secondary | ICD-10-CM

## 2015-04-02 DIAGNOSIS — F13239 Sedative, hypnotic or anxiolytic dependence with withdrawal, unspecified: Secondary | ICD-10-CM | POA: Diagnosis present

## 2015-04-02 DIAGNOSIS — R296 Repeated falls: Secondary | ICD-10-CM | POA: Diagnosis not present

## 2015-04-02 DIAGNOSIS — E785 Hyperlipidemia, unspecified: Secondary | ICD-10-CM | POA: Diagnosis present

## 2015-04-02 DIAGNOSIS — H9193 Unspecified hearing loss, bilateral: Secondary | ICD-10-CM | POA: Diagnosis present

## 2015-04-02 DIAGNOSIS — E876 Hypokalemia: Secondary | ICD-10-CM | POA: Diagnosis present

## 2015-04-02 DIAGNOSIS — D649 Anemia, unspecified: Secondary | ICD-10-CM | POA: Diagnosis present

## 2015-04-02 DIAGNOSIS — F1010 Alcohol abuse, uncomplicated: Secondary | ICD-10-CM | POA: Diagnosis present

## 2015-04-02 DIAGNOSIS — G4700 Insomnia, unspecified: Secondary | ICD-10-CM | POA: Diagnosis present

## 2015-04-02 DIAGNOSIS — F10239 Alcohol dependence with withdrawal, unspecified: Secondary | ICD-10-CM | POA: Diagnosis not present

## 2015-04-02 DIAGNOSIS — F10231 Alcohol dependence with withdrawal delirium: Secondary | ICD-10-CM | POA: Diagnosis not present

## 2015-04-02 DIAGNOSIS — R740 Nonspecific elevation of levels of transaminase and lactic acid dehydrogenase [LDH]: Secondary | ICD-10-CM

## 2015-04-02 DIAGNOSIS — R270 Ataxia, unspecified: Secondary | ICD-10-CM | POA: Diagnosis not present

## 2015-04-02 DIAGNOSIS — Z7982 Long term (current) use of aspirin: Secondary | ICD-10-CM

## 2015-04-02 DIAGNOSIS — F10931 Alcohol use, unspecified with withdrawal delirium: Secondary | ICD-10-CM | POA: Diagnosis present

## 2015-04-02 DIAGNOSIS — K7030 Alcoholic cirrhosis of liver without ascites: Secondary | ICD-10-CM | POA: Diagnosis present

## 2015-04-02 DIAGNOSIS — W19XXXA Unspecified fall, initial encounter: Secondary | ICD-10-CM

## 2015-04-02 DIAGNOSIS — K625 Hemorrhage of anus and rectum: Secondary | ICD-10-CM | POA: Diagnosis not present

## 2015-04-02 DIAGNOSIS — N189 Chronic kidney disease, unspecified: Secondary | ICD-10-CM | POA: Diagnosis present

## 2015-04-02 DIAGNOSIS — D696 Thrombocytopenia, unspecified: Secondary | ICD-10-CM | POA: Diagnosis present

## 2015-04-02 DIAGNOSIS — Z8673 Personal history of transient ischemic attack (TIA), and cerebral infarction without residual deficits: Secondary | ICD-10-CM | POA: Diagnosis not present

## 2015-04-02 DIAGNOSIS — J9811 Atelectasis: Secondary | ICD-10-CM | POA: Diagnosis not present

## 2015-04-02 DIAGNOSIS — K700 Alcoholic fatty liver: Secondary | ICD-10-CM | POA: Diagnosis present

## 2015-04-02 DIAGNOSIS — F17210 Nicotine dependence, cigarettes, uncomplicated: Secondary | ICD-10-CM | POA: Diagnosis present

## 2015-04-02 DIAGNOSIS — I6789 Other cerebrovascular disease: Secondary | ICD-10-CM | POA: Diagnosis not present

## 2015-04-02 DIAGNOSIS — Z8546 Personal history of malignant neoplasm of prostate: Secondary | ICD-10-CM

## 2015-04-02 LAB — CBC WITH DIFFERENTIAL/PLATELET
BASOS PCT: 1 % (ref 0–1)
Basophils Absolute: 0 10*3/uL (ref 0.0–0.1)
Eosinophils Absolute: 0 10*3/uL (ref 0.0–0.7)
Eosinophils Relative: 0 % (ref 0–5)
HCT: 36.4 % — ABNORMAL LOW (ref 39.0–52.0)
HEMOGLOBIN: 13 g/dL (ref 13.0–17.0)
LYMPHS ABS: 0.8 10*3/uL (ref 0.7–4.0)
Lymphocytes Relative: 16 % (ref 12–46)
MCH: 35.4 pg — AB (ref 26.0–34.0)
MCHC: 35.7 g/dL (ref 30.0–36.0)
MCV: 99.2 fL (ref 78.0–100.0)
Monocytes Absolute: 0.9 10*3/uL (ref 0.1–1.0)
Monocytes Relative: 18 % — ABNORMAL HIGH (ref 3–12)
NEUTROS ABS: 3.2 10*3/uL (ref 1.7–7.7)
NEUTROS PCT: 65 % (ref 43–77)
PLATELETS: 154 10*3/uL (ref 150–400)
RBC: 3.67 MIL/uL — AB (ref 4.22–5.81)
RDW: 17.2 % — ABNORMAL HIGH (ref 11.5–15.5)
WBC: 4.9 10*3/uL (ref 4.0–10.5)

## 2015-04-02 LAB — COMPREHENSIVE METABOLIC PANEL
ALK PHOS: 74 U/L (ref 39–117)
ALT: 69 U/L — AB (ref 0–53)
AST: 122 U/L — AB (ref 0–37)
Albumin: 3.5 g/dL (ref 3.5–5.2)
Anion gap: 19 — ABNORMAL HIGH (ref 5–15)
BILIRUBIN TOTAL: 1.8 mg/dL — AB (ref 0.3–1.2)
BUN: 9 mg/dL (ref 6–23)
CHLORIDE: 96 mmol/L (ref 96–112)
CO2: 21 mmol/L (ref 19–32)
CREATININE: 1.07 mg/dL (ref 0.50–1.35)
Calcium: 8.9 mg/dL (ref 8.4–10.5)
GFR calc Af Amer: 81 mL/min — ABNORMAL LOW (ref >90)
GFR calc non Af Amer: 70 mL/min — ABNORMAL LOW (ref >90)
Glucose, Bld: 132 mg/dL — ABNORMAL HIGH (ref 70–99)
POTASSIUM: 4.1 mmol/L (ref 3.5–5.1)
Sodium: 136 mmol/L (ref 135–145)
Total Protein: 6.6 g/dL (ref 6.0–8.3)

## 2015-04-02 LAB — ACETAMINOPHEN LEVEL: Acetaminophen (Tylenol), Serum: <10 ug/mL — ABNORMAL LOW (ref 10–30)

## 2015-04-02 LAB — URINALYSIS, ROUTINE W REFLEX MICROSCOPIC
Glucose, UA: NEGATIVE mg/dL
Hgb urine dipstick: NEGATIVE
Ketones, ur: 15 mg/dL — AB
NITRITE: NEGATIVE
PROTEIN: NEGATIVE mg/dL
Specific Gravity, Urine: 1.02 (ref 1.005–1.030)
UROBILINOGEN UA: 2 mg/dL — AB (ref 0.0–1.0)
pH: 6 (ref 5.0–8.0)

## 2015-04-02 LAB — AMMONIA: Ammonia: 35 umol/L — ABNORMAL HIGH (ref 11–32)

## 2015-04-02 LAB — I-STAT TROPONIN, ED: TROPONIN I, POC: 0.01 ng/mL (ref 0.00–0.08)

## 2015-04-02 LAB — ETHANOL: Alcohol, Ethyl (B): 70 mg/dL — ABNORMAL HIGH (ref 0–9)

## 2015-04-02 LAB — URINE MICROSCOPIC-ADD ON

## 2015-04-02 MED ORDER — ATORVASTATIN CALCIUM 10 MG PO TABS
10.0000 mg | ORAL_TABLET | Freq: Every day | ORAL | Status: DC
  Administered 2015-04-02 – 2015-04-07 (×6): 10 mg via ORAL
  Filled 2015-04-02 (×8): qty 1

## 2015-04-02 MED ORDER — CHLORDIAZEPOXIDE HCL 25 MG PO CAPS
25.0000 mg | ORAL_CAPSULE | Freq: Every day | ORAL | Status: DC
  Administered 2015-04-02 – 2015-04-07 (×6): 25 mg via ORAL
  Filled 2015-04-02 (×6): qty 1

## 2015-04-02 MED ORDER — VITAMIN B-1 100 MG PO TABS
100.0000 mg | ORAL_TABLET | Freq: Every day | ORAL | Status: DC
  Administered 2015-04-03 – 2015-04-08 (×6): 100 mg via ORAL
  Filled 2015-04-02 (×6): qty 1

## 2015-04-02 MED ORDER — LORAZEPAM 2 MG/ML IJ SOLN
2.0000 mg | Freq: Once | INTRAMUSCULAR | Status: AC
  Administered 2015-04-02: 2 mg via INTRAVENOUS
  Filled 2015-04-02: qty 1

## 2015-04-02 MED ORDER — SODIUM CHLORIDE 0.9 % IV SOLN
INTRAVENOUS | Status: DC
  Administered 2015-04-02: 21:00:00 via INTRAVENOUS
  Administered 2015-04-04: 1000 mL via INTRAVENOUS
  Administered 2015-04-05: 14:00:00 via INTRAVENOUS
  Administered 2015-04-06: 1000 mL via INTRAVENOUS

## 2015-04-02 MED ORDER — ADULT MULTIVITAMIN W/MINERALS CH
1.0000 | ORAL_TABLET | Freq: Every day | ORAL | Status: DC
  Administered 2015-04-03 – 2015-04-08 (×6): 1 via ORAL
  Filled 2015-04-02 (×6): qty 1

## 2015-04-02 MED ORDER — LORAZEPAM 1 MG PO TABS
1.0000 mg | ORAL_TABLET | Freq: Four times a day (QID) | ORAL | Status: AC | PRN
  Administered 2015-04-03: 1 mg via ORAL
  Filled 2015-04-02: qty 1

## 2015-04-02 MED ORDER — LORAZEPAM 2 MG/ML IJ SOLN
0.0000 mg | Freq: Four times a day (QID) | INTRAMUSCULAR | Status: AC
Start: 2015-04-02 — End: 2015-04-04
  Administered 2015-04-03 (×4): 2 mg via INTRAVENOUS
  Administered 2015-04-04: 1 mg via INTRAVENOUS
  Administered 2015-04-04 (×3): 2 mg via INTRAVENOUS
  Filled 2015-04-02 (×7): qty 1

## 2015-04-02 MED ORDER — FOLIC ACID 1 MG PO TABS
1.0000 mg | ORAL_TABLET | Freq: Every day | ORAL | Status: DC
  Administered 2015-04-03 – 2015-04-08 (×6): 1 mg via ORAL
  Filled 2015-04-02 (×6): qty 1

## 2015-04-02 MED ORDER — LORAZEPAM 2 MG/ML IJ SOLN
1.0000 mg | Freq: Four times a day (QID) | INTRAMUSCULAR | Status: AC | PRN
  Administered 2015-04-05 (×2): 1 mg via INTRAVENOUS
  Filled 2015-04-02 (×3): qty 1

## 2015-04-02 MED ORDER — CARVEDILOL 12.5 MG PO TABS
12.5000 mg | ORAL_TABLET | Freq: Two times a day (BID) | ORAL | Status: DC
  Administered 2015-04-02 – 2015-04-03 (×2): 12.5 mg via ORAL
  Filled 2015-04-02 (×4): qty 1

## 2015-04-02 MED ORDER — LORAZEPAM 2 MG/ML IJ SOLN
1.0000 mg | Freq: Once | INTRAMUSCULAR | Status: AC
  Administered 2015-04-02: 1 mg via INTRAVENOUS
  Filled 2015-04-02: qty 1

## 2015-04-02 MED ORDER — SODIUM CHLORIDE 0.9 % IV BOLUS (SEPSIS)
1000.0000 mL | INTRAVENOUS | Status: AC
  Administered 2015-04-02: 1000 mL via INTRAVENOUS

## 2015-04-02 MED ORDER — LORAZEPAM 2 MG/ML IJ SOLN
1.0000 mg | Freq: Four times a day (QID) | INTRAMUSCULAR | Status: DC | PRN
  Administered 2015-04-02: 1 mg via INTRAVENOUS
  Filled 2015-04-02: qty 1

## 2015-04-02 MED ORDER — METOPROLOL TARTRATE 1 MG/ML IV SOLN
2.5000 mg | Freq: Four times a day (QID) | INTRAVENOUS | Status: DC | PRN
  Filled 2015-04-02: qty 5

## 2015-04-02 MED ORDER — LORAZEPAM 1 MG PO TABS: 1.0000 mg | ORAL_TABLET | Freq: Four times a day (QID) | ORAL | Status: DC | PRN

## 2015-04-02 MED ORDER — METOPROLOL SUCCINATE ER 25 MG PO TB24
25.0000 mg | ORAL_TABLET | Freq: Every day | ORAL | Status: DC
  Administered 2015-04-02: 25 mg via ORAL
  Filled 2015-04-02: qty 1

## 2015-04-02 MED ORDER — ASPIRIN EC 81 MG PO TBEC
81.0000 mg | DELAYED_RELEASE_TABLET | Freq: Every day | ORAL | Status: DC
  Administered 2015-04-02 – 2015-04-03 (×2): 81 mg via ORAL
  Filled 2015-04-02 (×2): qty 1

## 2015-04-02 MED ORDER — LORAZEPAM 2 MG/ML IJ SOLN
0.0000 mg | Freq: Two times a day (BID) | INTRAMUSCULAR | Status: AC
  Administered 2015-04-04 – 2015-04-06 (×3): 2 mg via INTRAVENOUS
  Filled 2015-04-02 (×5): qty 1

## 2015-04-02 MED ORDER — HEPARIN SODIUM (PORCINE) 5000 UNIT/ML IJ SOLN
5000.0000 [IU] | Freq: Three times a day (TID) | INTRAMUSCULAR | Status: DC
  Administered 2015-04-02 – 2015-04-03 (×2): 5000 [IU] via SUBCUTANEOUS
  Filled 2015-04-02 (×4): qty 1

## 2015-04-02 MED ORDER — LORAZEPAM 2 MG/ML IJ SOLN
1.0000 mg | Freq: Once | INTRAMUSCULAR | Status: AC
Start: 2015-04-02 — End: 2015-04-02
  Administered 2015-04-02: 1 mg via INTRAVENOUS
  Filled 2015-04-02: qty 1

## 2015-04-02 MED ORDER — THIAMINE HCL 100 MG/ML IJ SOLN
100.0000 mg | Freq: Every day | INTRAMUSCULAR | Status: DC
  Filled 2015-04-02 (×2): qty 1

## 2015-04-02 MED ORDER — ASPIRIN 81 MG PO TBEC: 81.0000 mg | DELAYED_RELEASE_TABLET | Freq: Every day | ORAL | Status: DC

## 2015-04-02 NOTE — Progress Notes (Signed)
Pt anxious and very shaky, requesting more ativan or ambien. Pt does not have scheduled ativan with [facility]. Awaiting call from MD. Will continue to monitor

## 2015-04-02 NOTE — H&P (Addendum)
Hospitalist Admission History and Physical  Patient name: Joshua Henson Medical record number: [id number] Date of birth: 1946-09-10 Age: 69 y.o. Gender: male  Primary Care Provider: Adella Hare, MD  Chief Complaint: tremors, gait instability   History of Present Illness:This is a 69 y.o. year old male with significant past medical history of ETOH abuse, depression/anxiety, HTN, CVA presenting with tremors, gait instability. Pt reports 3 days of worsening tremors and gait instability. Pt states that he drinks up to 1/2 of 1/5th of vodka daily. Was on librium and ativan. States that he has been on ativan for almost 10 years when his psychiatrist decreased medication significantly 1 week ago. Pt reports onset of sxs since medication decrease. No fevers or chills. No nausea or vomiting. Still drinking daily. Reports compliance with rest of medication. Did not take metoprolol today. Pt denies any hx/o tremors/gait instability prior to current presentation.  Presents to the ER afebrile, HR 110s, rep 10s, BP 130s-140s, satting upper 90s on RA. CBC and CMET grossly WNL apart from AST 122, ALT 69, t bili 1.8. ETOH level in 70s.   Assessment and Plan: Joshua Henson is a 69 y.o. year old male presenting with tremor, gait instability, ETOH intoxication    Active Problems:   Tremor   1- Tremor/Gait Instability -likely multifactorial in setting of ETOH abuse and abrupt benzo withdrawal  -[facility] protocol  -MRI  -TSH  -ammonia -B12, folate  -neuro consult pending (I have asked EDP Aline Brochure to formally consult)  2-ETOH intoxication -ETOH level 70  -[facility] protocol  -banana bag  -follow  3-Tachycardia -improved s/p oral metopolol in ER -no active CP  -tele bed -cont home BB  -follow  4- HTN -LLN BPs s/p BB -hold diuretic   5-Psych  -mood stable in setting of tremors -cont librium  6-Transaminitis -likely secondary to ETOH abuse -hepatitis panel  -tylenol level  -follow  -no abd  pain  -abd u/s as clinically indicated  FEN/GI: heart healthy diet  Prophylaxis: sub q heparin  Disposition: pending further evaluation  Code Status:full code    Patient Active Problem List   Diagnosis Date Noted  . Tremor 04/02/2015  . Sinus tachycardia 02/12/2015  . Acute respiratory failure 09/05/2014  . Aspiration pneumonia 09/05/2014  . Lethargy 09/05/2014  . Alcohol withdrawal 09/03/2014  . Non-compliant behavior 07/24/2014  . CVA (cerebral infarction) 07/07/2014  . Unspecified cerebral artery occlusion with cerebral infarction 07/05/2014  . Syncope 07/02/2014  . Syncope and collapse 07/02/2014  . Scalp laceration 07/02/2014  . Alcohol abuse 07/02/2014  . Orthostatic hypotension 07/02/2014  . Hearing loss 11/25/2013  . Tinnitus of both ears 11/25/2013   Past Medical History: Past Medical History  Diagnosis Date  . Prostate cancer 2010    External beam radiation (urol - Risa Grill, XRT Valere Dross)  . Stroke   . Depression   . Tachycardia   . Chronic kidney disease   . Hypertension   . Insomnia     Past Surgical History: Past Surgical History  Procedure Laterality Date  . None      Social History: History   Social History  . Marital Status: Married    Spouse Name: N/A  . Number of Children: 3  . Years of Education: 16   Occupational History  . lawyer    Social History Main Topics  . Smoking status: Current Every Day Smoker -- 0.50 packs/day for 30 years    Types: Cigarettes  . Smokeless tobacco: Never Used  .  Alcohol Use: 4.2 oz/week    7 Shots of liquor per week     Comment: 1/5 daily  . Drug Use: No  . Sexual Activity: Not on file   Other Topics Concern  . None   Social History Narrative   HS[employer], [employer], [place]. Married 22 yrs yrs - divorced. Twin boys - 49; 1 dtr - '94. Pension scheme manager.     Family History: Family History  Problem Relation Age of Onset  . Cancer Mother     esophageal  . Hypertension Mother   .  Diabetes Mother   . Cancer Father     prostate  . Heart disease Maternal Grandfather     MI  . Cancer Paternal Grandfather     prostate    Allergies: Allergies  Allergen Reactions  . Celebrex [Celecoxib] Rash  . Cymbalta [Duloxetine Hcl] Other (See Comments)    Did not work   . Lexapro [Escitalopram] Other (See Comments)    Did not work   . Trazodone And Nefazodone Other (See Comments)    Did not work   . Wellbutrin [Bupropion] Other (See Comments)    Did not work     Current Facility-Administered Medications  Medication Dose Route Frequency Provider Last Rate Last Dose  . 0.9 %  sodium chloride infusion   Intravenous Continuous Deneise Lever, MD      . aspirin EC tablet 81 mg  81 mg Oral Daily Deneise Lever, MD      . atorvastatin (LIPITOR) tablet 10 mg  10 mg Oral Daily Deneise Lever, MD      . carvedilol (COREG) tablet 12.5 mg  12.5 mg Oral BID Deneise Lever, MD      . chlordiazePOXIDE (LIBRIUM) capsule 25 mg  25 mg Oral QHS Deneise Lever, MD      . folic acid (FOLVITE) tablet 1 mg  1 mg Oral Daily Deneise Lever, MD      . heparin injection 5,000 Units  5,000 Units Subcutaneous 3 times per day Deneise Lever, MD      . LORazepam (ATIVAN) injection 1 mg  1 mg Intravenous Once Pamella Pert, MD      . LORazepam (ATIVAN) tablet 1 mg  1 mg Oral Q6H PRN Deneise Lever, MD       Or  . LORazepam (ATIVAN) injection 1 mg  1 mg Intravenous Q6H PRN Deneise Lever, MD      . metoprolol succinate (TOPROL-XL) 24 hr tablet 25 mg  25 mg Oral Daily Pamella Pert, MD   25 mg at 04/02/15 1744  . multivitamin with minerals tablet 1 tablet  1 tablet Oral Daily Deneise Lever, MD      . thiamine (VITAMIN B-1) tablet 100 mg  100 mg Oral Daily Deneise Lever, MD       Or  . thiamine (B-1) injection 100 mg  100 mg Intravenous Daily Deneise Lever, MD       Current Outpatient Prescriptions  Medication Sig Dispense Refill  . aspirin 81 MG EC tablet Take 1 tablet (81 mg  total) by mouth daily. 30 tablet 12  . atorvastatin (LIPITOR) 10 MG tablet Take 1 tablet (10 mg total) by mouth daily. 30 tablet 1  . carvedilol (COREG) 12.5 MG tablet Take 12.5 mg by mouth 2 (two) times daily.  0  . chlordiazePOXIDE (LIBRIUM) 25 MG capsule Take 25 mg by mouth at bedtime.  0  . CIALIS 20 MG tablet Take 20 mg by mouth as needed for erectile dysfunction.   0  . folic acid (FOLVITE) 1 MG tablet Take 1 tablet (1 mg total) by mouth daily. 30 tablet 1  . tamsulosin (FLOMAX) 0.4 MG CAPS capsule Take 0.4 mg by mouth daily.   0  . triamterene-hydrochlorothiazide (DYAZIDE) 37.5-25 MG per capsule Take 1 capsule by mouth daily.  0  . LORazepam (ATIVAN) 1 MG tablet Take 1 tablet (1 mg total) by mouth 3 (three) times daily. (Patient not taking: Reported on 04/02/2015) 15 tablet 0  . potassium chloride SA (K-DUR,KLOR-CON) 20 MEQ tablet Take 1 tablet (20 mEq total) by mouth daily. 3 tablet 0   Review Of Systems: 12 point ROS negative except as noted above in HPI.  Physical Exam: Filed Vitals:   04/02/15 1742  BP: 134/98  Pulse: 115  Temp: 99.2 F (37.3 C)  Resp: 19    General: cooperative and active tremor HEENT: PERRLA and extra ocular movement intact Heart: S1, S2 normal, no murmur, rub or gallop, regular rate and rhythm Lungs: clear to auscultation, no wheezes or rales and unlabored breathing Abdomen: abdomen is soft without significant tenderness, masses, organomegaly or guarding Extremities: extremities normal, atraumatic, no cyanosis or edema Skin:no rashes Neurology: normal without focal findings  Labs and Imaging: Lab Results  Component Value Date/Time   NA 136 04/02/2015 05:00 PM   K 4.1 04/02/2015 05:00 PM   CL 96 04/02/2015 05:00 PM   CO2 21 04/02/2015 05:00 PM   BUN 9 04/02/2015 05:00 PM   CREATININE 1.07 04/02/2015 05:00 PM   CREATININE 0.84 01/03/2014 11:14 AM   GLUCOSE 132* 04/02/2015 05:00 PM   Lab Results  Component Value Date   WBC 4.9 04/02/2015   HGB  13.0 04/02/2015   HCT 36.4* 04/02/2015   MCV 99.2 04/02/2015   PLT 154 04/02/2015    Dg Chest 2 View  04/02/2015   CLINICAL DATA:  Multiple falls over the last 3 days with increasing malaise and loss of bladder control. Initial encounter.  EXAM: CHEST  2 VIEW  COMPARISON:  09/09/2014 and 09/07/2014.  FINDINGS: Lower lung volumes with asymmetric elevation of the right hemidiaphragm and mild associated right basilar atelectasis. No confluent airspace opacity, edema or pleural effusion. The heart size is stable. There is no evidence of acute fracture.  IMPRESSION: Low lung volumes with mild right basilar atelectasis. No evidence of consolidation or acute osseous injury.   Electronically Signed   By: Richardean Sale M.D.   On: 04/02/2015 16:58   Ct Head Wo Contrast  04/02/2015   CLINICAL DATA:  Fatigue, recent falls.  History of CVA.  EXAM: CT HEAD WITHOUT CONTRAST  TECHNIQUE: Contiguous axial images were obtained from the base of the skull through the vertex without intravenous contrast.  COMPARISON:  12/09/2014  FINDINGS: No intracranial hemorrhage, mass effect, or midline shift. No hydrocephalus. The basilar cisterns are patent. Mild atrophy and chronic small vessel ischemic change, stable from prior. No evidence of territorial infarct. No intracranial fluid collection. Calvarium is intact. Mucosal thickening of the maxillary sinuses with question of left maxillary sinus made retention cyst, unchanged. Mastoid air cells are well aerated.  IMPRESSION: No acute intracranial abnormality. Stable atrophy and chronic small vessel ischemic change.   Electronically Signed   By: Jeb Levering M.D.   On: 04/02/2015 17:37           Shanda Howells MD  Pager: [phone]

## 2015-04-02 NOTE — Consult Note (Signed)
Admission H&P    Chief Complaint: Tremulousness and unstable gait.  HPI: Joshua Henson is an 69 y.o. male with a history of prostate cancer, stroke, depression, hypertension and alcohol abuse, presenting with tremulousness for several days as well as increasingly unsteady gait and insomnia. He been taking Ativan 1 mg 3 times a day almost 10 years, but medication was discontinued about one week ago. Patient also admits to drinking alcohol on a daily basis, one half to one fifth of vodka per day. His alcohol level in the emergency room today was 70. He has a history of alcohol withdrawal in September 2015. He was admitted to [facility]. He reportedly had delirium tremens. There was no report of withdrawal seizures. In addition to being tremulous patient was tachycardic with a heart rate in the 120s. He has not experienced focal weakness or loss of sensation in lower extremities. He said a few episodes of wetting is closed with urine but attributes it to difficulty getting to the bathroom and coordination problems with unfastening his clothing. CT scan of his head showed no acute intracranial abnormality. Diffuse atrophy was noted, including midline cerebellar atrophy.  Past Medical History  Diagnosis Date  . Prostate cancer 2010    External beam radiation (urol - Risa Grill, XRT Valere Dross)  . Stroke   . Depression   . Tachycardia   . Chronic kidney disease   . Hypertension   . Insomnia     Past Surgical History  Procedure Laterality Date  . None      Family History  Problem Relation Age of Onset  . Cancer Mother     esophageal  . Hypertension Mother   . Diabetes Mother   . Cancer Father     prostate  . Heart disease Maternal Grandfather     MI  . Cancer Paternal Grandfather     prostate   Social History:  reports that he has been smoking Cigarettes.  He has a 15 pack-year smoking history. He has never used smokeless tobacco. He reports that he drinks about 4.2 oz of alcohol per  week. He reports that he does not use illicit drugs.  Allergies:  Allergies  Allergen Reactions  . Celebrex [Celecoxib] Rash  . Cymbalta [Duloxetine Hcl] Other (See Comments)    Did not work   . Lexapro [Escitalopram] Other (See Comments)    Did not work   . Trazodone And Nefazodone Other (See Comments)    Did not work   . Wellbutrin [Bupropion] Other (See Comments)    Did not work     Medications: Patient's preadmission medications were reviewed by me.  ROS: History obtained from his significant other areas and the patient  General ROS: negative for - chills, fatigue, fever, night sweats, weight gain or weight loss Psychological ROS: negative for - behavioral disorder, hallucinations, memory difficulties, mood swings or suicidal ideation Ophthalmic ROS: negative for - blurry vision, double vision, eye pain or loss of vision ENT ROS: negative for - epistaxis, nasal discharge, oral lesions, sore throat, tinnitus or vertigo Allergy and Immunology ROS: negative for - hives or itchy/watery eyes Hematological and Lymphatic ROS: negative for - bleeding problems, bruising or swollen lymph nodes Endocrine ROS: negative for - galactorrhea, hair pattern changes, polydipsia/polyuria or temperature intolerance Respiratory ROS: negative for - cough, hemoptysis, shortness of breath or wheezing Cardiovascular ROS: negative for - chest pain, dyspnea on exertion, edema or irregular heartbeat Gastrointestinal ROS: negative for - abdominal pain, diarrhea, hematemesis, nausea/vomiting  or stool incontinence Genito-Urinary ROS: negative for - dysuria, hematuria, incontinence or urinary frequency/urgency Musculoskeletal ROS: negative for - joint swelling or muscular weakness Neurological ROS: as noted in HPI Dermatological ROS: negative for rash and skin lesion changes  Physical Examination: Blood pressure 134/98, pulse 115, temperature 99.2 F (37.3 C), temperature source Rectal, resp. rate 19,  height 5' 10"  (1.778 m), weight 95.255 kg (210 lb), SpO2 97 %.  HEENT-  Normocephalic, no lesions, without obvious abnormality.  Normal external eye and conjunctiva.  Normal TM's bilaterally.  Normal auditory canals and external ears. Normal external nose, mucus membranes and septum.  Normal pharynx. Neck supple with no masses, nodes, nodules or enlargement. Cardiovascular - tachycardic with regular rhythm, normal S1 and S2, no murmur Lungs - chest clear, no wheezing, rales, normal symmetric air entry Abdomen - soft, non-tender; bowel sounds normal; no masses,  no organomegaly Extremities - no joint deformities, effusion, or inflammation, ecchymosis and swelling over the lateral left ankle  Neurologic Examination: Mental Status: Alert, oriented, diffusely tremulous.  Speech fluent without evidence of aphasia. Able to follow commands without difficulty. Cranial Nerves: II-Visual fields were normal. III/IV/VI-Pupils were equal and reacted normally to light. Extraocular movements were full and conjugate.    V/VII-no facial numbness and no facial weakness. VIII-normal. X-normal speech and symmetrical palatal movement. XI: trapezius strength/neck flexion strength normal bilaterally XII-midline tongue extension with normal strength. Motor: 5/5 of upper and lower extremities proximally and distally; normal muscle tone throughout. Sensory: Normal throughout. Deep Tendon Reflexes: 2+ and symmetric. Plantars: Flexor bilaterally Cerebellar: Minimal intention tremor with finger-to-nose testing, as well as with heel-to-shin testing consistent with extent of tremulousness; no gross ataxia of extremities. Carotid auscultation: Normal   Results for orders placed or performed during the hospital encounter of 04/02/15 (from the past 48 hour(s))  CBC with Differential/Platelet     Status: Abnormal   Collection Time: 04/02/15  5:00 PM  Result Value Ref Range   WBC 4.9 4.0 - 10.5 K/uL   RBC 3.67 (L) 4.22  - 5.81 MIL/uL   Hemoglobin 13.0 13.0 - 17.0 g/dL   HCT 36.4 (L) 39.0 - 52.0 %   MCV 99.2 78.0 - 100.0 fL   MCH 35.4 (H) 26.0 - 34.0 pg   MCHC 35.7 30.0 - 36.0 g/dL   RDW 17.2 (H) 11.5 - 15.5 %   Platelets 154 150 - 400 K/uL   Neutrophils Relative % 65 43 - 77 %   Neutro Abs 3.2 1.7 - 7.7 K/uL   Lymphocytes Relative 16 12 - 46 %   Lymphs Abs 0.8 0.7 - 4.0 K/uL   Monocytes Relative 18 (H) 3 - 12 %   Monocytes Absolute 0.9 0.1 - 1.0 K/uL   Eosinophils Relative 0 0 - 5 %   Eosinophils Absolute 0.0 0.0 - 0.7 K/uL   Basophils Relative 1 0 - 1 %   Basophils Absolute 0.0 0.0 - 0.1 K/uL  Comprehensive metabolic panel     Status: Abnormal   Collection Time: 04/02/15  5:00 PM  Result Value Ref Range   Sodium 136 135 - 145 mmol/L   Potassium 4.1 3.5 - 5.1 mmol/L   Chloride 96 96 - 112 mmol/L   CO2 21 19 - 32 mmol/L   Glucose, Bld 132 (H) 70 - 99 mg/dL   BUN 9 6 - 23 mg/dL   Creatinine, Ser 1.07 0.50 - 1.35 mg/dL   Calcium 8.9 8.4 - 10.5 mg/dL   Total Protein 6.6 6.0 -  8.3 g/dL   Albumin 3.5 3.5 - 5.2 g/dL   AST 122 (H) 0 - 37 U/L   ALT 69 (H) 0 - 53 U/L   Alkaline Phosphatase 74 39 - 117 U/L   Total Bilirubin 1.8 (H) 0.3 - 1.2 mg/dL   GFR calc non Af Amer 70 (L) >90 mL/min   GFR calc Af Amer 81 (L) >90 mL/min    Comment: (NOTE) The eGFR has been calculated using the CKD EPI equation. This calculation has not been validated in all clinical situations. eGFR's persistently <90 mL/min signify possible Chronic Kidney Disease.    Anion gap 19 (H) 5 - 15  Ethanol     Status: Abnormal   Collection Time: 04/02/15  5:00 PM  Result Value Ref Range   Alcohol, Ethyl (B) 70 (H) 0 - 9 mg/dL    Comment:        LOWEST DETECTABLE LIMIT FOR SERUM ALCOHOL IS 11 mg/dL FOR MEDICAL PURPOSES ONLY   I-stat troponin, ED     Status: None   Collection Time: 04/02/15  5:17 PM  Result Value Ref Range   Troponin i, poc 0.01 0.00 - 0.08 ng/mL   Comment 3            Comment: Due to the release kinetics  of cTnI, a negative result within the first hours of the onset of symptoms does not rule out myocardial infarction with certainty. If myocardial infarction is still suspected, repeat the test at appropriate intervals.    Dg Chest 2 View  04/02/2015   CLINICAL DATA:  Multiple falls over the last 3 days with increasing malaise and loss of bladder control. Initial encounter.  EXAM: CHEST  2 VIEW  COMPARISON:  09/09/2014 and 09/07/2014.  FINDINGS: Lower lung volumes with asymmetric elevation of the right hemidiaphragm and mild associated right basilar atelectasis. No confluent airspace opacity, edema or pleural effusion. The heart size is stable. There is no evidence of acute fracture.  IMPRESSION: Low lung volumes with mild right basilar atelectasis. No evidence of consolidation or acute osseous injury.   Electronically Signed   By: Richardean Sale M.D.   On: 04/02/2015 16:58   Ct Head Wo Contrast  04/02/2015   CLINICAL DATA:  Fatigue, recent falls.  History of CVA.  EXAM: CT HEAD WITHOUT CONTRAST  TECHNIQUE: Contiguous axial images were obtained from the base of the skull through the vertex without intravenous contrast.  COMPARISON:  12/09/2014  FINDINGS: No intracranial hemorrhage, mass effect, or midline shift. No hydrocephalus. The basilar cisterns are patent. Mild atrophy and chronic small vessel ischemic change, stable from prior. No evidence of territorial infarct. No intracranial fluid collection. Calvarium is intact. Mucosal thickening of the maxillary sinuses with question of left maxillary sinus made retention cyst, unchanged. Mastoid air cells are well aerated.  IMPRESSION: No acute intracranial abnormality. Stable atrophy and chronic small vessel ischemic change.   Electronically Signed   By: Jeb Levering M.D.   On: 04/02/2015 17:37    Assessment/Plan 69 year old man presenting with tremulousness and coordination difficulty, most likely secondary to acute alcohol and benzodiazepine  withdrawal. Patient has no clinical weakness in extremities or any gross coordination abnormalities. CT showed no acute intracranial abnormality.  Recommendations: 1. No further neurodiagnostic studies are indicated at this point. 2. CIWA protocol as planned. 3. Vitamin B 12 and multivitamins daily. 4. Physical therapy intervention when patient is stable from withdrawal state. 5. Vitamin B-12 and folate levels.  We will continue to  follow this patient with you.  C.R. Nicole Kindred, MD [facility]  04/02/2015, 7:58 PM

## 2015-04-02 NOTE — Progress Notes (Signed)
Pt heart rate in the 130s, Paged MD, awaiting callback will continue to monitor.

## 2015-04-02 NOTE — ED Notes (Signed)
Report attempt x 1 

## 2015-04-02 NOTE — ED Notes (Addendum)
Per ems, pt c/o multiple falls over past 3 days w/increasing malaise and loss of bladder control.  Pt reports daily etoh, last drink this am at 8am per patient. Pt reports generalized malaise, states "i feel horrible"

## 2015-04-02 NOTE — ED Provider Notes (Signed)
CSN: [id number]     Arrival date & time 04/02/15  34 History   First MD Initiated Contact with Patient 04/02/15 1532     Chief Complaint  Patient presents with  . Fall     (Consider location/radiation/quality/duration/timing/severity/associated sxs/prior Treatment) HPI Comments:  69 y.o. male w hx of prostate ca, CVA, CKD, HTN, smoker, chronic etoh abuse who presents with multiple complaints. He states that he has had worsening fatigue over the last week. He has had increased tremulousness and had several mechanical falls. He notes worsening generalized weakness and is unable to support himself walking. He also reports that he has been on Ativan 3 times a day for several years. This medication was recently discontinued by his psychiatrist 1-2 weeks ago. He notes that recently he has discontinued EtOH intake but has been drinking again over the last week to try and sleep. He states he has also had insomnia. His symptoms are constant in nature and have been worsening over the last 2-3 days. He denies any other associated symptoms. He has not had similar symptoms previously. Nothing is relieving his symptoms as far. Timing of symptoms as constant. The severity is moderate.  The history is provided by the patient.    Past Medical History  Diagnosis Date  . Prostate cancer 2010    External beam radiation (urol - Risa Grill, XRT Valere Dross)  . Stroke   . Depression   . Tachycardia   . Chronic kidney disease   . Hypertension   . Insomnia    Past Surgical History  Procedure Laterality Date  . None     Family History  Problem Relation Age of Onset  . Cancer Mother     esophageal  . Hypertension Mother   . Diabetes Mother   . Cancer Father     prostate  . Heart disease Maternal Grandfather     MI  . Cancer Paternal Grandfather     prostate   History  Substance Use Topics  . Smoking status: Current Every Day Smoker -- 0.50 packs/day for 30 years    Types: Cigarettes  . Smokeless  tobacco: Never Used  . Alcohol Use: 4.2 oz/week    7 Shots of liquor per week     Comment: 1/5 daily    Review of Systems  Constitutional: Positive for fatigue. Negative for fever.  HENT: Negative for drooling and rhinorrhea.   Eyes: Negative for pain.  Respiratory: Negative for cough and shortness of breath.   Cardiovascular: Negative for chest pain and leg swelling.  Gastrointestinal: Negative for nausea, vomiting, abdominal pain and diarrhea.  Genitourinary: Negative for dysuria and hematuria.  Musculoskeletal: Negative for gait problem and neck pain.  Skin: Negative for color change.  Neurological: Positive for tremors and weakness. Negative for numbness and headaches.  Hematological: Negative for adenopathy.  Psychiatric/Behavioral: Negative for behavioral problems.  All other systems reviewed and are negative.     Allergies  Celebrex; Cymbalta; Lexapro; Trazodone and nefazodone; and Wellbutrin  Home Medications   Prior to Admission medications   Medication Sig Start Date End Date Taking? Authorizing Provider  aspirin 81 MG EC tablet Take 1 tablet (81 mg total) by mouth daily. 07/11/14   Lavon Paganini Angiulli, PA-C  atorvastatin (LIPITOR) 10 MG tablet Take 1 tablet (10 mg total) by mouth daily. 07/11/14   Lavon Paganini Angiulli, PA-C  carvedilol (COREG) 12.5 MG tablet Take 12.5 mg by mouth 2 (two) times daily. 01/19/15   Historical Provider, MD  CIALIS  20 MG tablet Take 20 mg by mouth as needed. 12/31/14   Historical Provider, MD  folic acid (FOLVITE) 1 MG tablet Take 1 tablet (1 mg total) by mouth daily. 07/11/14   Lavon Paganini Angiulli, PA-C  loratadine (CLARITIN) 10 MG tablet Take 10 mg by mouth daily as needed for allergies. 07/11/14   Lavon Paganini Angiulli, PA-C  LORazepam (ATIVAN) 1 MG tablet Take 1 tablet (1 mg total) by mouth 3 (three) times daily. 09/12/14   Costin Karlyne Greenspan, MD  metoprolol succinate (TOPROL-XL) 25 MG 24 hr tablet  11/19/14   Historical Provider, MD  potassium chloride SA  (K-DUR,KLOR-CON) 20 MEQ tablet Take 1 tablet (20 mEq total) by mouth daily. 12/11/14   Ernestina Patches, MD  QUEtiapine (SEROQUEL) 25 MG tablet Take 100 mg by mouth at bedtime.    Historical Provider, MD  tamsulosin (FLOMAX) 0.4 MG CAPS capsule  11/25/14   Historical Provider, MD  triamterene-hydrochlorothiazide (DYAZIDE) 37.5-25 MG per capsule Take 1 capsule by mouth daily. 01/30/15   Historical Provider, MD   BP 142/92 mmHg  Pulse 124  Temp(Src) 99.6 F (37.6 C) (Oral)  Resp 22  Ht 5\' 10"  (1.778 m)  Wt 210 lb (95.255 kg)  BMI 30.13 kg/m2  SpO2 98% Physical Exam  Constitutional: He is oriented to person, place, and time. He appears well-developed and well-nourished.  HENT:  Head: Normocephalic.  Right Ear: External ear normal.  Left Ear: External ear normal.  Nose: Nose normal.  Mouth/Throat: Oropharynx is clear and moist. No oropharyngeal exudate.  Mild bruising of the nasal bridge.  Eyes: EOM are normal. Pupils are equal, round, and reactive to light.  Mild conjunctival injection bilaterally.  Neck: Normal range of motion. Neck supple.  Cardiovascular: Regular rhythm, normal heart sounds and intact distal pulses.  Exam reveals no gallop and no friction rub.   No murmur heard. 120's, sinus tachycardia  Pulmonary/Chest: Effort normal and breath sounds normal. No respiratory distress. He has no wheezes.  Abdominal: Soft. Bowel sounds are normal. He exhibits no distension. There is no tenderness. There is no rebound and no guarding.  Musculoskeletal: Normal range of motion. He exhibits no edema or tenderness.  Neurological: He is alert and oriented to person, place, and time.  alert, oriented x3 speech: normal in context and clarity memory: intact grossly cranial nerves II-XII: intact motor strength: full proximally and distally He appears tremulous in his upper extremities  sensation: intact to light touch diffusely  cerebellar: finger-to-nose and heel-to-shin intact gait: very  unsteady, able to stand for seconds but could not walk  Skin: Skin is warm and dry.  Psychiatric: He has a normal mood and affect. His behavior is normal.  Nursing note and vitals reviewed.   ED Course  Procedures (including critical care time) Labs Review Labs Reviewed  CBC WITH DIFFERENTIAL/PLATELET - Abnormal; Notable for the following:    RBC 3.67 (*)    HCT 36.4 (*)    MCH 35.4 (*)    RDW 17.2 (*)    Monocytes Relative 18 (*)    All other components within normal limits  COMPREHENSIVE METABOLIC PANEL - Abnormal; Notable for the following:    Glucose, Bld 132 (*)    AST 122 (*)    ALT 69 (*)    Total Bilirubin 1.8 (*)    GFR calc non Af Amer 70 (*)    GFR calc Af Amer 81 (*)    Anion gap 19 (*)    All other components  within normal limits  URINALYSIS, ROUTINE W REFLEX MICROSCOPIC - Abnormal; Notable for the following:    Color, Urine ORANGE (*)    Bilirubin Urine MODERATE (*)    Ketones, ur 15 (*)    Urobilinogen, UA 2.0 (*)    Leukocytes, UA TRACE (*)    All other components within normal limits  ETHANOL - Abnormal; Notable for the following:    Alcohol, Ethyl (B) 70 (*)    All other components within normal limits  AMMONIA - Abnormal; Notable for the following:    Ammonia 35 (*)    All other components within normal limits  ACETAMINOPHEN LEVEL - Abnormal; Notable for the following:    Acetaminophen (Tylenol), Serum <10.0 (*)    All other components within normal limits  URINE MICROSCOPIC-ADD ON - Abnormal; Notable for the following:    Bacteria, UA MANY (*)    All other components within normal limits  URINE CULTURE  TSH  COMPREHENSIVE METABOLIC PANEL  CBC WITH DIFFERENTIAL/PLATELET  HEPATITIS PANEL, ACUTE  VITAMIN B12  FOLATE  I-STAT TROPOININ, ED    Imaging Review Dg Chest 2 View  04/02/2015   CLINICAL DATA:  Multiple falls over the last 3 days with increasing malaise and loss of bladder control. Initial encounter.  EXAM: CHEST  2 VIEW  COMPARISON:   09/09/2014 and 09/07/2014.  FINDINGS: Lower lung volumes with asymmetric elevation of the right hemidiaphragm and mild associated right basilar atelectasis. No confluent airspace opacity, edema or pleural effusion. The heart size is stable. There is no evidence of acute fracture.  IMPRESSION: Low lung volumes with mild right basilar atelectasis. No evidence of consolidation or acute osseous injury.   Electronically Signed   By: Richardean Sale M.D.   On: 04/02/2015 16:58   Ct Head Wo Contrast  04/02/2015   CLINICAL DATA:  Fatigue, recent falls.  History of CVA.  EXAM: CT HEAD WITHOUT CONTRAST  TECHNIQUE: Contiguous axial images were obtained from the base of the skull through the vertex without intravenous contrast.  COMPARISON:  12/09/2014  FINDINGS: No intracranial hemorrhage, mass effect, or midline shift. No hydrocephalus. The basilar cisterns are patent. Mild atrophy and chronic small vessel ischemic change, stable from prior. No evidence of territorial infarct. No intracranial fluid collection. Calvarium is intact. Mucosal thickening of the maxillary sinuses with question of left maxillary sinus made retention cyst, unchanged. Mastoid air cells are well aerated.  IMPRESSION: No acute intracranial abnormality. Stable atrophy and chronic small vessel ischemic change.   Electronically Signed   By: Jeb Levering M.D.   On: 04/02/2015 17:37     EKG Interpretation None      ED ECG REPORT   Date: 04/02/2015  Rate: 128  Rhythm: sinus tachycardia  QRS Axis: normal  Intervals: normal  ST/T Wave abnormalities: nonspecific ST changes  Conduction Disutrbances:none  Narrative Interpretation: No significant change when compared to previous  Old EKG Reviewed: unchanged  I have personally reviewed the EKG tracing and agree with the computerized printout as noted.   MDM   Final diagnoses:  Fatigue  Falls  Ataxia    3:47 PM 69 y.o. male w hx of prostate ca, CVA, CKD, HTN, smoker, chronic etoh  abuse who presents with multiple complaints. He states that he has had worsening fatigue over the last week. He has had increased tremulousness and had several mechanical falls. He notes worsening generalized weakness and is unable to support himself walking. He also reports that he has been on Ativan  3 times a day for several years. This medication was recently discontinued by his psychiatrist 1-2 weeks ago. He notes that recently he has discontinued EtOH intake but has been drinking again over the last week to try and sleep. He states he has also had insomnia. He is a 99.6 rectal temperature, mild tachycardic to 120s, vital signs otherwise unremarkable. He denies any focal pain including abdominal or chest pain.  Sx potentially related to etoh/benzo withdrawal. Given inability to ambulate. Will admit to hospitalist. Neuro consulted for eval.     Pamella Pert, MD 04/03/15 [phone]

## 2015-04-02 NOTE — Progress Notes (Signed)
MD notified due to acute change in patient status. Vital signs and labs are listed below.       MD notified(1st page) Time of 1st page: [phone] MD notified 2nd page [phone]   Vital Signs Filed Vitals:   04/02/15 2000 04/02/15 2018 04/02/15 2030 04/02/15 2103  BP: 120/83 120/83 119/79 139/88  Pulse: 122 120 118 123  Temp:    98.4 F (36.9 C)  TempSrc:    Oral  Resp:  20  15  Height:    5\' 10"  (1.778 m)  Weight:    99.292 kg (218 lb 14.4 oz)  SpO2: 97% 97% 98% 98%     Lab Results WBC  Date/Time Value Ref Range Status  04/02/2015 05:00 PM 4.9 4.0 - 10.5 K/uL Final  12/09/2014 02:21 PM 3.8* 4.0 - 10.5 K/uL Final  09/08/2014 03:40 AM 8.0 4.0 - 10.5 K/uL Final   NEUTROPHILS RELATIVE %  Date/Time Value Ref Range Status  04/02/2015 05:00 PM 65 43 - 77 % Final  12/09/2014 02:21 PM 46 43 - 77 % Final  09/02/2014 11:16 PM 58 43 - 77 % Final   PCO2 ARTERIAL  Date/Time Value Ref Range Status  09/05/2014 09:58 AM 29.0* 35.0 - 45.0 mmHg Final   No results found for: LATICACIDVEN No results found for: Cyndy Freeze, RN 04/02/2015, 9:49 PM

## 2015-04-03 ENCOUNTER — Inpatient Hospital Stay (HOSPITAL_COMMUNITY): Payer: Medicare Other

## 2015-04-03 ENCOUNTER — Observation Stay (HOSPITAL_COMMUNITY): Payer: Medicare Other

## 2015-04-03 ENCOUNTER — Encounter (HOSPITAL_COMMUNITY): Payer: Self-pay | Admitting: Gastroenterology

## 2015-04-03 DIAGNOSIS — D649 Anemia, unspecified: Secondary | ICD-10-CM | POA: Diagnosis present

## 2015-04-03 DIAGNOSIS — R2681 Unsteadiness on feet: Secondary | ICD-10-CM | POA: Diagnosis not present

## 2015-04-03 DIAGNOSIS — Z9181 History of falling: Secondary | ICD-10-CM | POA: Diagnosis not present

## 2015-04-03 DIAGNOSIS — R7989 Other specified abnormal findings of blood chemistry: Secondary | ICD-10-CM | POA: Diagnosis not present

## 2015-04-03 DIAGNOSIS — K920 Hematemesis: Secondary | ICD-10-CM | POA: Diagnosis not present

## 2015-04-03 DIAGNOSIS — F13239 Sedative, hypnotic or anxiolytic dependence with withdrawal, unspecified: Secondary | ICD-10-CM | POA: Diagnosis present

## 2015-04-03 DIAGNOSIS — K700 Alcoholic fatty liver: Secondary | ICD-10-CM | POA: Diagnosis present

## 2015-04-03 DIAGNOSIS — K760 Fatty (change of) liver, not elsewhere classified: Secondary | ICD-10-CM | POA: Diagnosis not present

## 2015-04-03 DIAGNOSIS — K922 Gastrointestinal hemorrhage, unspecified: Secondary | ICD-10-CM | POA: Diagnosis not present

## 2015-04-03 DIAGNOSIS — D509 Iron deficiency anemia, unspecified: Secondary | ICD-10-CM | POA: Diagnosis not present

## 2015-04-03 DIAGNOSIS — Z79899 Other long term (current) drug therapy: Secondary | ICD-10-CM | POA: Diagnosis not present

## 2015-04-03 DIAGNOSIS — G4700 Insomnia, unspecified: Secondary | ICD-10-CM | POA: Diagnosis present

## 2015-04-03 DIAGNOSIS — R251 Tremor, unspecified: Secondary | ICD-10-CM | POA: Diagnosis not present

## 2015-04-03 DIAGNOSIS — R5383 Other fatigue: Secondary | ICD-10-CM | POA: Diagnosis not present

## 2015-04-03 DIAGNOSIS — I10 Essential (primary) hypertension: Secondary | ICD-10-CM | POA: Diagnosis not present

## 2015-04-03 DIAGNOSIS — I129 Hypertensive chronic kidney disease with stage 1 through stage 4 chronic kidney disease, or unspecified chronic kidney disease: Secondary | ICD-10-CM | POA: Diagnosis present

## 2015-04-03 DIAGNOSIS — E876 Hypokalemia: Secondary | ICD-10-CM | POA: Diagnosis not present

## 2015-04-03 DIAGNOSIS — M6281 Muscle weakness (generalized): Secondary | ICD-10-CM | POA: Diagnosis not present

## 2015-04-03 DIAGNOSIS — F10231 Alcohol dependence with withdrawal delirium: Secondary | ICD-10-CM | POA: Diagnosis present

## 2015-04-03 DIAGNOSIS — E785 Hyperlipidemia, unspecified: Secondary | ICD-10-CM | POA: Diagnosis not present

## 2015-04-03 DIAGNOSIS — F1010 Alcohol abuse, uncomplicated: Secondary | ICD-10-CM | POA: Diagnosis not present

## 2015-04-03 DIAGNOSIS — K709 Alcoholic liver disease, unspecified: Secondary | ICD-10-CM | POA: Diagnosis not present

## 2015-04-03 DIAGNOSIS — D696 Thrombocytopenia, unspecified: Secondary | ICD-10-CM | POA: Diagnosis present

## 2015-04-03 DIAGNOSIS — H9193 Unspecified hearing loss, bilateral: Secondary | ICD-10-CM | POA: Diagnosis not present

## 2015-04-03 DIAGNOSIS — F17210 Nicotine dependence, cigarettes, uncomplicated: Secondary | ICD-10-CM | POA: Diagnosis present

## 2015-04-03 DIAGNOSIS — R2689 Other abnormalities of gait and mobility: Secondary | ICD-10-CM | POA: Diagnosis not present

## 2015-04-03 DIAGNOSIS — Z7982 Long term (current) use of aspirin: Secondary | ICD-10-CM | POA: Diagnosis not present

## 2015-04-03 DIAGNOSIS — R5381 Other malaise: Secondary | ICD-10-CM | POA: Diagnosis present

## 2015-04-03 DIAGNOSIS — K7030 Alcoholic cirrhosis of liver without ascites: Secondary | ICD-10-CM | POA: Diagnosis present

## 2015-04-03 DIAGNOSIS — N189 Chronic kidney disease, unspecified: Secondary | ICD-10-CM | POA: Diagnosis present

## 2015-04-03 DIAGNOSIS — R740 Nonspecific elevation of levels of transaminase and lactic acid dehydrogenase [LDH]: Secondary | ICD-10-CM | POA: Diagnosis not present

## 2015-04-03 DIAGNOSIS — R270 Ataxia, unspecified: Secondary | ICD-10-CM | POA: Diagnosis not present

## 2015-04-03 DIAGNOSIS — Z8673 Personal history of transient ischemic attack (TIA), and cerebral infarction without residual deficits: Secondary | ICD-10-CM | POA: Diagnosis not present

## 2015-04-03 DIAGNOSIS — Z8546 Personal history of malignant neoplasm of prostate: Secondary | ICD-10-CM | POA: Diagnosis not present

## 2015-04-03 DIAGNOSIS — K740 Hepatic fibrosis: Secondary | ICD-10-CM | POA: Diagnosis not present

## 2015-04-03 DIAGNOSIS — K625 Hemorrhage of anus and rectum: Secondary | ICD-10-CM | POA: Diagnosis not present

## 2015-04-03 DIAGNOSIS — F10239 Alcohol dependence with withdrawal, unspecified: Secondary | ICD-10-CM | POA: Diagnosis not present

## 2015-04-03 LAB — COMPREHENSIVE METABOLIC PANEL
ALBUMIN: 3 g/dL — AB (ref 3.5–5.2)
ALT: 54 U/L — ABNORMAL HIGH (ref 0–53)
AST: 94 U/L — ABNORMAL HIGH (ref 0–37)
Alkaline Phosphatase: 64 U/L (ref 39–117)
Anion gap: 14 (ref 5–15)
BILIRUBIN TOTAL: 1.7 mg/dL — AB (ref 0.3–1.2)
BUN: 12 mg/dL (ref 6–23)
CO2: 25 mmol/L (ref 19–32)
CREATININE: 1.13 mg/dL (ref 0.50–1.35)
Calcium: 8 mg/dL — ABNORMAL LOW (ref 8.4–10.5)
Chloride: 96 mmol/L (ref 96–112)
GFR calc Af Amer: 76 mL/min — ABNORMAL LOW (ref >90)
GFR calc non Af Amer: 65 mL/min — ABNORMAL LOW (ref >90)
Glucose, Bld: 114 mg/dL — ABNORMAL HIGH (ref 70–99)
Potassium: 2.6 mmol/L — CL (ref 3.5–5.1)
Sodium: 135 mmol/L (ref 135–145)
Total Protein: 5.6 g/dL — ABNORMAL LOW (ref 6.0–8.3)

## 2015-04-03 LAB — CBC WITH DIFFERENTIAL/PLATELET
BASOS PCT: 1 % (ref 0–1)
Basophils Absolute: 0 10*3/uL (ref 0.0–0.1)
Eosinophils Absolute: 0 10*3/uL (ref 0.0–0.7)
Eosinophils Relative: 0 % (ref 0–5)
HCT: 28.8 % — ABNORMAL LOW (ref 39.0–52.0)
Hemoglobin: 9.9 g/dL — ABNORMAL LOW (ref 13.0–17.0)
LYMPHS PCT: 25 % (ref 12–46)
Lymphs Abs: 0.9 10*3/uL (ref 0.7–4.0)
MCH: 34.9 pg — ABNORMAL HIGH (ref 26.0–34.0)
MCHC: 34.4 g/dL (ref 30.0–36.0)
MCV: 101.4 fL — ABNORMAL HIGH (ref 78.0–100.0)
Monocytes Absolute: 0.6 10*3/uL (ref 0.1–1.0)
Monocytes Relative: 17 % — ABNORMAL HIGH (ref 3–12)
NEUTROS ABS: 2 10*3/uL (ref 1.7–7.7)
NEUTROS PCT: 57 % (ref 43–77)
PLATELETS: 96 10*3/uL — AB (ref 150–400)
RBC: 2.84 MIL/uL — AB (ref 4.22–5.81)
RDW: 17.4 % — ABNORMAL HIGH (ref 11.5–15.5)
WBC: 3.5 10*3/uL — AB (ref 4.0–10.5)

## 2015-04-03 LAB — HEPATITIS PANEL, ACUTE
HCV AB: NEGATIVE
Hep A IgM: NONREACTIVE
Hep B C IgM: NONREACTIVE
Hepatitis B Surface Ag: NEGATIVE

## 2015-04-03 LAB — VITAMIN B12
VITAMIN B 12: 582 pg/mL (ref 211–911)
Vitamin B-12: 583 pg/mL (ref 211–911)

## 2015-04-03 LAB — IRON AND TIBC
Iron: 92 ug/dL (ref 42–165)
SATURATION RATIOS: 47 % (ref 20–55)
TIBC: 197 ug/dL — ABNORMAL LOW (ref 215–435)
UIBC: 105 ug/dL — AB (ref 125–400)

## 2015-04-03 LAB — RETICULOCYTES
RBC.: 3.03 MIL/uL — AB (ref 4.22–5.81)
Retic Count, Absolute: 100 10*3/uL (ref 19.0–186.0)
Retic Ct Pct: 3.3 % — ABNORMAL HIGH (ref 0.4–3.1)

## 2015-04-03 LAB — HEMOGLOBIN AND HEMATOCRIT, BLOOD
HCT: 30.5 % — ABNORMAL LOW (ref 39.0–52.0)
HCT: 31.5 % — ABNORMAL LOW (ref 39.0–52.0)
Hemoglobin: 10.6 g/dL — ABNORMAL LOW (ref 13.0–17.0)
Hemoglobin: 11 g/dL — ABNORMAL LOW (ref 13.0–17.0)

## 2015-04-03 LAB — TSH: TSH: 3.7 u[IU]/mL (ref 0.350–4.500)

## 2015-04-03 LAB — POTASSIUM: Potassium: 3 mmol/L — ABNORMAL LOW (ref 3.5–5.1)

## 2015-04-03 LAB — MAGNESIUM: Magnesium: 1.4 mg/dL — ABNORMAL LOW (ref 1.5–2.5)

## 2015-04-03 LAB — FOLATE
Folate: 12.3 ng/mL
Folate: >20 ng/mL

## 2015-04-03 LAB — FERRITIN: Ferritin: 1049 ng/mL — ABNORMAL HIGH (ref 22–322)

## 2015-04-03 LAB — PHOSPHORUS: Phosphorus: 1.4 mg/dL — ABNORMAL LOW (ref 2.3–4.6)

## 2015-04-03 LAB — CK: Total CK: 108 U/L (ref 7–232)

## 2015-04-03 MED ORDER — POTASSIUM CHLORIDE 10 MEQ/100ML IV SOLN
10.0000 meq | INTRAVENOUS | Status: AC
  Administered 2015-04-03 (×3): 10 meq via INTRAVENOUS
  Filled 2015-04-03 (×3): qty 100

## 2015-04-03 MED ORDER — PANTOPRAZOLE SODIUM 40 MG IV SOLR
40.0000 mg | Freq: Two times a day (BID) | INTRAVENOUS | Status: DC
Start: 2015-04-04 — End: 2015-04-04
  Administered 2015-04-04: 40 mg via INTRAVENOUS
  Filled 2015-04-03 (×3): qty 40

## 2015-04-03 MED ORDER — CEFTRIAXONE SODIUM IN DEXTROSE 20 MG/ML IV SOLN
1.0000 g | INTRAVENOUS | Status: DC
  Administered 2015-04-04: 1 g via INTRAVENOUS
  Filled 2015-04-03: qty 50

## 2015-04-03 MED ORDER — DEXTROSE 5 % IV SOLN
30.0000 mmol | Freq: Once | INTRAVENOUS | Status: AC
  Administered 2015-04-03: 30 mmol via INTRAVENOUS
  Filled 2015-04-03: qty 10

## 2015-04-03 MED ORDER — MAGNESIUM SULFATE 50 % IJ SOLN
3.0000 g | Freq: Once | INTRAVENOUS | Status: AC
  Administered 2015-04-03: 3 g via INTRAVENOUS
  Filled 2015-04-03: qty 6

## 2015-04-03 MED ORDER — IOHEXOL 300 MG/ML  SOLN
100.0000 mL | Freq: Once | INTRAMUSCULAR | Status: AC | PRN
  Administered 2015-04-03: 100 mL via INTRAVENOUS

## 2015-04-03 MED ORDER — IOHEXOL 300 MG/ML  SOLN
25.0000 mL | INTRAMUSCULAR | Status: AC
  Administered 2015-04-03 (×2): 25 mL via ORAL

## 2015-04-03 NOTE — Progress Notes (Signed)
CRITICAL VALUE ALERT  Critical value received:  Potassium  Date of notification:  4/15/206  Time of notification:  0724     Critical value read back:Yes.    Nurse who received alert:  [place]  MD notified (1st page): Dr. Ernestina Patches  Time of first page:  0725  MD notified (2nd page): Dr. Karleen Hampshire  Time of second page: [phone]  [facility] RN aware.

## 2015-04-03 NOTE — Evaluation (Signed)
Physical Therapy Evaluation Patient Details Name: Joshua Henson MRN: [id number] DOB: [birth year] Today's Date: 04/03/2015   History of Present Illness  69 y.o. male admitted with Generalized weakness/ fatigue and gait instability and some tremors. Found to have blood per rectum and transaminitis.  Clinical Impression  Pt admitted with above diagnosis. Pt currently with functional limitations due to the deficits listed below (see PT Problem List). Patient requires min assist for stability and cues for safety with poor environmental awareness while ambulating. HR elevated to 121 while ambulating SpO2 of 94% on room air. He does not have 24 hour supervision at home and has had a fall recently resulting in minor trauma to his face. Significant other agrees patient may benefit from short term SNF prior to returning home in order to improve functional independence and safety. Pt will benefit from skilled PT to progress independence with mobility.       Follow Up Recommendations SNF;Supervision/Assistance - 24 hour    Equipment Recommendations  None recommended by PT    Recommendations for Other Services OT consult     Precautions / Restrictions Precautions Precautions: Fall Restrictions Weight Bearing Restrictions: No      Mobility  Bed Mobility Overal bed mobility: Needs Assistance Bed Mobility: Supine to Sit;Sit to Supine     Supine to sit: Min assist Sit to supine: Supervision   General bed mobility comments: min assist for trunk support to rise to edge of bed. able to perform sit to supine position with supervision and cues to scoot self up in bed.  Transfers Overall transfer level: Needs assistance Equipment used: Rolling walker (2 wheeled) Transfers: Sit to/from Stand Sit to Stand: Min assist         General transfer comment: min assist for balance. Cues for hand placement.  Ambulation/Gait Ambulation/Gait assistance: Min assist Ambulation Distance (Feet): 65  Feet Assistive device: Rolling walker (2 wheeled) Gait Pattern/deviations: Step-through pattern;Decreased stride length;Trunk flexed;Wide base of support;Drifts right/left Gait velocity: decreased   General Gait Details: Educated on safe DME use with a rolling walker. Required frequent correction for walker placement and control. Drifting into surrounding objects. VC for awareness of surroundings, and for upright posture as he leans heavily on RW for support. Required 2 standing rest breaks to complete distance. HR to 121, SpO2 94% on room air.  Stairs            Wheelchair Mobility    Modified Rankin (Stroke Patients Only)       Balance Overall balance assessment: Needs assistance;History of Falls Sitting-balance support: No upper extremity supported;Feet supported Sitting balance-Leahy Scale: Fair     Standing balance support: Single extremity supported Standing balance-Leahy Scale: Poor                               Pertinent Vitals/Pain Pain Assessment: No/denies pain    Home Living Family/patient expects to be discharged to:: Private residence Living Arrangements: Spouse/significant other Available Help at Discharge: Family (significan other works) Type of Home: Other(Comment) (condo) Home Access: Stairs to enter Entrance Stairs-Rails: Psychiatric nurse of Steps: 3 Home Layout: Two level;Able to live on main level with bedroom/bathroom Home Equipment: Gilford Rile - 2 wheels;Shower seat - built in;Bedside commode (3-in-1)      Prior Function Level of Independence: Independent               Hand Dominance   Dominant Hand: Right    Extremity/Trunk Assessment  Upper Extremity Assessment: Defer to OT evaluation           Lower Extremity Assessment: Difficult to assess due to impaired cognition;Generalized weakness         Communication   Communication: HOH  Cognition Arousal/Alertness: Awake/alert Behavior During  Therapy: Flat affect Overall Cognitive Status: Impaired/Different from baseline Area of Impairment: Following commands;Problem solving;Safety/judgement       Following Commands: Follows one step commands with increased time;Follows multi-step commands inconsistently;Follows multi-step commands with increased time Safety/Judgement: Decreased awareness of safety   Problem Solving: Slow processing;Difficulty sequencing;Requires verbal cues;Requires tactile cues      General Comments General comments (skin integrity, edema, etc.): Most information obtained through significant other as pt fell asleep quickly upon returning to bed.    Exercises        Assessment/Plan    PT Assessment Patient needs continued PT services  PT Diagnosis Difficulty walking;Abnormality of gait;Generalized weakness   PT Problem List Decreased strength;Decreased activity tolerance;Decreased balance;Decreased mobility;Decreased coordination;Decreased cognition;Decreased knowledge of use of DME;Decreased safety awareness;Cardiopulmonary status limiting activity  PT Treatment Interventions DME instruction;Gait training;Stair training;Functional mobility training;Therapeutic activities;Therapeutic exercise;Balance training;Neuromuscular re-education;Cognitive remediation;Patient/family education   PT Goals (Current goals can be found in the Care Plan section) Acute Rehab PT Goals Patient Stated Goal: none stated PT Goal Formulation: With patient/family Time For Goal Achievement: 04/17/15 Potential to Achieve Goals: Good    Frequency Min 3X/week   Barriers to discharge Decreased caregiver support significant other works during the day    Co-evaluation               End of Session Equipment Utilized During Treatment: Gait belt Activity Tolerance: Patient limited by fatigue;No increased pain Patient left: in bed;with call bell/phone within reach;with family/visitor present (Bed alarm will not arm - RN  notified) Nurse Communication: Mobility status;Other (comment);Precautions (Bed alram will not arm)         Time: 1655-3748 PT Time Calculation (min) (ACUTE ONLY): 23 min   Charges:   PT Evaluation $Initial PT Evaluation Tier I: 1 Procedure PT Treatments $Gait Training: 8-22 mins   PT G Codes:        Ellouise Newer 04/03/2015, 5:51 PM Camille Bal Dexter, [place]

## 2015-04-03 NOTE — Progress Notes (Signed)
0754 Pt potassium 2.6 and has not voided in 12 hours. Bladder scanned showed 79cc. Paged Dr. Karleen Hampshire, Awaiting call back. Made day RN aware.

## 2015-04-03 NOTE — Progress Notes (Signed)
[facility] PROGRESS NOTE  KINSTON MAGNAN RCB:[id number] DOB: Oct 19, 1946 DOA: 04/02/2015 PCP: Adella Hare, MD  Assessment/Plan: 1. Generalized weakness/ fatigue and gait instability and some tremors: Initially admitted for evaluation of CVA, his MRI brain is negative for acute stroke. In view of his alcohol abuse, he is being treated for alcohol withdrawal and benzo withdrawal. Neurology consulted and recommendations given. Meanwhile PT eval for deconditioning. His vitamain and b12 levels are within normal limits. His CK level is normal. TSH within normal limits   Hypertension: well controlled.    Transaminitis Probably secondary to alcohol abuse, suspect liver cirrhosis in view of his thrombocytopenia and transaminitis.  CT abdomen and pelvis ordered for further evaluation. Hepatitis panel negative.    Blood per rectum: - change to clear liquid diet, . H&H every 8 hours, IV protonix, GI consulted and recommendations given.  Stopped coreg and watch his hemoglobin. Anemia panel ordered.   Hypokalemia: replete as needed.    Hypomagnesemia: Replete as needed.   Hypophosphatemia: Replace as needed.   UTI: Urine cultures ordered and pending, meanwhile he will be on rocephin.        Code Status: full code.  Family Communication: discussed in dtail with the gf at bedside Disposition Plan: pending PT eval.    Consultants:  gastroenterology  Procedures:  NONE  Antibiotics:  Rocephin for possible UTI  HPI/Subjective: Feeling weak and exhausted  Objective: Filed Vitals:   04/03/15 1459  BP: 105/92  Pulse: 105  Temp: 98 F (36.7 C)  Resp: 16    Intake/Output Summary (Last 24 hours) at 04/03/15 1537 Last data filed at 04/03/15 1444  Gross per 24 hour  Intake 3402.66 ml  Output    200 ml  Net 3202.66 ml   Filed Weights   04/02/15 1530 04/02/15 2103  Weight: 95.255 kg (210 lb) 99.292 kg (218 lb 14.4 oz)    Exam:   General:  Alert afebrile not  in any distress  Cardiovascular: s1s2  Respiratory: clear to auscultation, no wheezing or rhonchi  Abdomen: soft non tender non distended bowel sounds heard  Musculoskeletal: trace pedal edema   Data Reviewed: Basic Metabolic Panel:  Recent Labs Lab 04/02/15 1700 04/03/15 0535 04/03/15 1250  NA 136 135  --   K 4.1 2.6*  --   CL 96 96  --   CO2 21 25  --   GLUCOSE 132* 114*  --   BUN 9 12  --   CREATININE 1.07 1.13  --   CALCIUM 8.9 8.0*  --   MG  --  1.4*  --   PHOS  --   --  1.4*   Liver Function Tests:  Recent Labs Lab 04/02/15 1700 04/03/15 0535  AST 122* 94*  ALT 69* 54*  ALKPHOS 74 64  BILITOT 1.8* 1.7*  PROT 6.6 5.6*  ALBUMIN 3.5 3.0*   No results for input(s): LIPASE, AMYLASE in the last 168 hours.  Recent Labs Lab 04/02/15 2248  AMMONIA 35*   CBC:  Recent Labs Lab 04/02/15 1700 04/03/15 0535 04/03/15 1250  WBC 4.9 3.5*  --   NEUTROABS 3.2 2.0  --   HGB 13.0 9.9* 10.6*  HCT 36.4* 28.8* 30.5*  MCV 99.2 101.4*  --   PLT 154 96*  --    Cardiac Enzymes:  Recent Labs Lab 04/03/15 1250  CKTOTAL 108   BNP (last 3 results) No results for input(s): BNP in the last 8760 hours.  ProBNP (last 3 results)  Recent Labs  09/03/14 0520  PROBNP 12.6    CBG: No results for input(s): GLUCAP in the last 168 hours.  No results found for this or any previous visit (from the past 240 hour(s)).   Studies: Dg Chest 2 View  04/02/2015   CLINICAL DATA:  Multiple falls over the last 3 days with increasing malaise and loss of bladder control. Initial encounter.  EXAM: CHEST  2 VIEW  COMPARISON:  09/09/2014 and 09/07/2014.  FINDINGS: Lower lung volumes with asymmetric elevation of the right hemidiaphragm and mild associated right basilar atelectasis. No confluent airspace opacity, edema or pleural effusion. The heart size is stable. There is no evidence of acute fracture.  IMPRESSION: Low lung volumes with mild right basilar atelectasis. No evidence of  consolidation or acute osseous injury.   Electronically Signed   By: Richardean Sale M.D.   On: 04/02/2015 16:58   Ct Head Wo Contrast  04/02/2015   CLINICAL DATA:  Fatigue, recent falls.  History of CVA.  EXAM: CT HEAD WITHOUT CONTRAST  TECHNIQUE: Contiguous axial images were obtained from the base of the skull through the vertex without intravenous contrast.  COMPARISON:  12/09/2014  FINDINGS: No intracranial hemorrhage, mass effect, or midline shift. No hydrocephalus. The basilar cisterns are patent. Mild atrophy and chronic small vessel ischemic change, stable from prior. No evidence of territorial infarct. No intracranial fluid collection. Calvarium is intact. Mucosal thickening of the maxillary sinuses with question of left maxillary sinus made retention cyst, unchanged. Mastoid air cells are well aerated.  IMPRESSION: No acute intracranial abnormality. Stable atrophy and chronic small vessel ischemic change.   Electronically Signed   By: Jeb Levering M.D.   On: 04/02/2015 17:37   Mr Brain Wo Contrast  04/03/2015   CLINICAL DATA:  Initial evaluation for acute tremulousness and unsteady gait.  EXAM: MRI HEAD WITHOUT CONTRAST  TECHNIQUE: Multiplanar, multiecho pulse sequences of the brain and surrounding structures were obtained without intravenous contrast.  COMPARISON:  Prior CT from 04/02/2015  FINDINGS: Diffuse prominence of the CSF containing spaces is compatible with generalized cerebral atrophy. Mild chronic small vessel ischemic changes present within the periventricular white matter.  No abnormal foci of restricted diffusion to suggest acute intracranial infarct. Gray-white matter differentiation maintained. No acute or chronic intracranial hemorrhage. Normal intravascular flow voids preserved.  No mass lesion or midline shift. No hydrocephalus. No extra-axial fluid collection.  Craniocervical junction within normal limits. Incidental note made of a partially empty sella. No acute abnormality  about the orbits. Mucosal thickening present within the right maxillary sinus, likely chronic in nature. Retention cyst present within the left maxillary sinus. Paranasal sinuses are otherwise clear. Scant fluid signal intensity present within the right mastoid air cells.  Bone marrow signal intensity normal. No scalp soft tissue abnormality.  IMPRESSION: 1. No acute intracranial infarct or other abnormality identified. 2. Generalized cerebral atrophy with mild chronic small vessel ischemic disease.   Electronically Signed   By: Jeannine Boga M.D.   On: 04/03/2015 02:28    Scheduled Meds: . atorvastatin  10 mg Oral q1800  . [START ON 04/04/2015] cefTRIAXone (ROCEPHIN)  IV  1 g Intravenous Q24H  . chlordiazePOXIDE  25 mg Oral QHS  . folic acid  1 mg Oral Daily  . LORazepam  0-4 mg Intravenous Q6H   Followed by  . [START ON 04/05/2015] LORazepam  0-4 mg Intravenous Q12H  . multivitamin with minerals  1 tablet Oral Daily  . [START ON 04/04/2015] pantoprazole (  PROTONIX) IV  40 mg Intravenous Q12H  . thiamine  100 mg Oral Daily   Or  . thiamine  100 mg Intravenous Daily   Continuous Infusions: . sodium chloride 100 mL/hr at 04/02/15 2110    Active Problems:   Alcohol abuse   Alcohol withdrawal   Tremor    Time spent: 35 minutes    Damaria Stofko  [facility] Pager [phone] If 7PM-7AM, please contact night-coverage at [email or web address], password [facility] 04/03/2015, 3:37 PM  LOS: 0 days

## 2015-04-03 NOTE — Progress Notes (Signed)
UR completed 

## 2015-04-03 NOTE — Progress Notes (Signed)
Subjective: Slept well last night.  Continues to be tremulous but not as bad.   Objective: Current vital signs: BP 103/68 mmHg  Pulse 66  Temp(Src) 98.1 F (36.7 C) (Oral)  Resp 20  Ht 5\' 10"  (1.778 m)  Wt 99.292 kg (218 lb 14.4 oz)  BMI 31.41 kg/m2  SpO2 100% Vital signs in last 24 hours: Temp:  [98.1 F (36.7 C)-99.6 F (37.6 C)] 98.1 F (36.7 C) (04/15 0629) Pulse Rate:  [66-130] 66 (04/15 0629) Resp:  [12-32] 20 (04/15 0655) BP: (103-162)/(68-128) 103/68 mmHg (04/15 0629) SpO2:  [97 %-100 %] 100 % (04/15 0629) Weight:  [95.255 kg (210 lb)-99.292 kg (218 lb 14.4 oz)] 99.292 kg (218 lb 14.4 oz) (04/14 2103)  Intake/Output from previous day: 04/14 0701 - 04/15 0700 In: 1000 [I.V.:1000] Out: -  Intake/Output this shift: Total I/O In: 1083.3 [I.V.:1083.3] Out: -  Nutritional status: Diet Heart Room service appropriate?: Yes; Fluid consistency:: Thin  Neurologic Exam: Mental Status: Alert, oriented. Speech fluent without evidence of aphasia. Able to follow commands without difficulty. Cranial Nerves: II-Visual fields were normal. III/IV/VI-Pupils were equal and reacted normally to light. Extraocular movements were full and conjugate.  V/VII-no facial numbness and no facial weakness. VIII-normal. X-normal speech and symmetrical palatal movement. XI: trapezius strength/neck flexion strength normal bilaterally XII-midline tongue extension with normal strength. Motor: 5/5 of upper and lower extremities proximally and distally; normal muscle tone throughout. Postural tremor noted.  Sensory: Normal throughout. Deep Tendon Reflexes: 2+ and symmetric. Plantars: Flexor bilaterally Cerebellar: Minimal intention tremor with finger-to-nose testing, as well as with heel-to-shin testing consistent with extent of tremulousness; no gross ataxia of extremities.   Lab Results: Basic Metabolic Panel:  Recent Labs Lab 04/02/15 1700 04/03/15 0535  NA 136 135  K 4.1 2.6*  CL  96 96  CO2 21 25  GLUCOSE 132* 114*  BUN 9 12  CREATININE 1.07 1.13  CALCIUM 8.9 8.0*    Liver Function Tests:  Recent Labs Lab 04/02/15 1700 04/03/15 0535  AST 122* 94*  ALT 69* 54*  ALKPHOS 74 64  BILITOT 1.8* 1.7*  PROT 6.6 5.6*  ALBUMIN 3.5 3.0*   No results for input(s): LIPASE, AMYLASE in the last 168 hours.  Recent Labs Lab 04/02/15 2248  AMMONIA 35*    CBC:  Recent Labs Lab 04/02/15 1700 04/03/15 0535  WBC 4.9 3.5*  NEUTROABS 3.2 2.0  HGB 13.0 9.9*  HCT 36.4* 28.8*  MCV 99.2 101.4*  PLT 154 96*    Cardiac Enzymes: No results for input(s): CKTOTAL, CKMB, CKMBINDEX, TROPONINI in the last 168 hours.  Lipid Panel: No results for input(s): CHOL, TRIG, HDL, CHOLHDL, VLDL, LDLCALC in the last 168 hours.  CBG: No results for input(s): GLUCAP in the last 168 hours.  Microbiology: Results for orders placed or performed during the hospital encounter of 12/09/14  Clostridium Difficile by PCR     Status: Abnormal   Collection Time: 12/10/14  2:27 PM  Result Value Ref Range Status   C difficile by pcr POSITIVE (A) NEGATIVE Final    Comment: CRITICAL RESULT CALLED TO, READ BACK BY AND VERIFIED WITH: YUAL RN 16:00 12/10/14 ([email or web address])     Coagulation Studies: No results for input(s): LABPROT, INR in the last 72 hours.  Imaging: Dg Chest 2 View  04/02/2015   CLINICAL DATA:  Multiple falls over the last 3 days with increasing malaise and loss of bladder control. Initial encounter.  EXAM: CHEST  2 VIEW  COMPARISON:  09/09/2014  and 09/07/2014.  FINDINGS: Lower lung volumes with asymmetric elevation of the right hemidiaphragm and mild associated right basilar atelectasis. No confluent airspace opacity, edema or pleural effusion. The heart size is stable. There is no evidence of acute fracture.  IMPRESSION: Low lung volumes with mild right basilar atelectasis. No evidence of consolidation or acute osseous injury.   Electronically Signed   By: Richardean Sale M.D.    On: 04/02/2015 16:58   Ct Head Wo Contrast  04/02/2015   CLINICAL DATA:  Fatigue, recent falls.  History of CVA.  EXAM: CT HEAD WITHOUT CONTRAST  TECHNIQUE: Contiguous axial images were obtained from the base of the skull through the vertex without intravenous contrast.  COMPARISON:  12/09/2014  FINDINGS: No intracranial hemorrhage, mass effect, or midline shift. No hydrocephalus. The basilar cisterns are patent. Mild atrophy and chronic small vessel ischemic change, stable from prior. No evidence of territorial infarct. No intracranial fluid collection. Calvarium is intact. Mucosal thickening of the maxillary sinuses with question of left maxillary sinus made retention cyst, unchanged. Mastoid air cells are well aerated.  IMPRESSION: No acute intracranial abnormality. Stable atrophy and chronic small vessel ischemic change.   Electronically Signed   By: Jeb Levering M.D.   On: 04/02/2015 17:37   Mr Brain Wo Contrast  04/03/2015   CLINICAL DATA:  Initial evaluation for acute tremulousness and unsteady gait.  EXAM: MRI HEAD WITHOUT CONTRAST  TECHNIQUE: Multiplanar, multiecho pulse sequences of the brain and surrounding structures were obtained without intravenous contrast.  COMPARISON:  Prior CT from 04/02/2015  FINDINGS: Diffuse prominence of the CSF containing spaces is compatible with generalized cerebral atrophy. Mild chronic small vessel ischemic changes present within the periventricular white matter.  No abnormal foci of restricted diffusion to suggest acute intracranial infarct. Gray-white matter differentiation maintained. No acute or chronic intracranial hemorrhage. Normal intravascular flow voids preserved.  No mass lesion or midline shift. No hydrocephalus. No extra-axial fluid collection.  Craniocervical junction within normal limits. Incidental note made of a partially empty sella. No acute abnormality about the orbits. Mucosal thickening present within the right maxillary sinus, likely  chronic in nature. Retention cyst present within the left maxillary sinus. Paranasal sinuses are otherwise clear. Scant fluid signal intensity present within the right mastoid air cells.  Bone marrow signal intensity normal. No scalp soft tissue abnormality.  IMPRESSION: 1. No acute intracranial infarct or other abnormality identified. 2. Generalized cerebral atrophy with mild chronic small vessel ischemic disease.   Electronically Signed   By: Jeannine Boga M.D.   On: 04/03/2015 02:28    Medications:  Scheduled: . aspirin EC  81 mg Oral Daily  . atorvastatin  10 mg Oral q1800  . carvedilol  12.5 mg Oral BID WC  . chlordiazePOXIDE  25 mg Oral QHS  . folic acid  1 mg Oral Daily  . heparin  5,000 Units Subcutaneous 3 times per day  . LORazepam  0-4 mg Intravenous Q6H   Followed by  . [START ON 04/05/2015] LORazepam  0-4 mg Intravenous Q12H  . multivitamin with minerals  1 tablet Oral Daily  . potassium chloride  10 mEq Intravenous Q1 Hr x 3  . thiamine  100 mg Oral Daily   Or  . thiamine  100 mg Intravenous Daily    Assessment/Plan: 69 year old man presenting with tremulousness and coordination difficulty, most likely secondary to acute alcohol and benzodiazepine withdrawal. MRI negative.   Recommendations: 1. No further neurodiagnostic studies are indicated at this point. 2.  CIWA protocol as planned. 3. Vitamin B 12 and multivitamins daily. 4. Physical therapy intervention when patient is stable from withdrawal state.   Neurology S/O  Etta Quill PA-C [facility] [phone]  04/03/2015, 9:27 AM  Patient seen and examined together with physician assistant and I concur with the assessment and plan.  Dorian Pod, MD

## 2015-04-03 NOTE — Consult Note (Signed)
Reason for Consult: Bright red blood per rectum and anemia Referring Physician: Hospital team  Joshua Henson is an 69 y.o. male.  HPI: Patient known to me from our children going to school together as well as taking care of him over the years and his last colonoscopy and endoscopy was 2012 which showed some small hemorrhoids minimal radiation proctitis a few diverticuli and a couple small polyps and he is due for repeat screening in December 2017 and his endoscopy just showed a small hiatal hernia back then and lately he's been having trouble with alcohol and depression medicines and significant trouble sleeping and he tells me he seen bright red blood for the last few days but his bowels have been normal and has not had any GI complaints except a few bouts recently of C. difficile and he has no upper tract symptoms and his hospital computer chart and our office computer chart was reviewed  Past Medical History  Diagnosis Date  . Prostate cancer 2010    External beam radiation (urol - Risa Grill, XRT Valere Dross)  . Stroke   . Depression   . Tachycardia   . Chronic kidney disease   . Hypertension   . Insomnia     Past Surgical History  Procedure Laterality Date  . None      Family History  Problem Relation Age of Onset  . Cancer Mother     esophageal  . Hypertension Mother   . Diabetes Mother   . Cancer Father     prostate  . Heart disease Maternal Grandfather     MI  . Cancer Paternal Grandfather     prostate    Social History:  reports that he has been smoking Cigarettes.  He has a 15 pack-year smoking history. He has never used smokeless tobacco. He reports that he drinks about 4.2 oz of alcohol per week. He reports that he does not use illicit drugs.  Allergies:  Allergies  Allergen Reactions  . Celebrex [Celecoxib] Rash  . Cymbalta [Duloxetine Hcl] Other (See Comments)    Did not work   . Lexapro [Escitalopram] Other (See Comments)    Did not work   . Trazodone And  Nefazodone Other (See Comments)    Did not work   . Wellbutrin [Bupropion] Other (See Comments)    Did not work     Medications: I have reviewed the patient's current medications.  Results for orders placed or performed during the hospital encounter of 04/02/15 (from the past 48 hour(s))  CBC with Differential/Platelet     Status: Abnormal   Collection Time: 04/02/15  5:00 PM  Result Value Ref Range   WBC 4.9 4.0 - 10.5 K/uL   RBC 3.67 (L) 4.22 - 5.81 MIL/uL   Hemoglobin 13.0 13.0 - 17.0 g/dL   HCT 36.4 (L) 39.0 - 52.0 %   MCV 99.2 78.0 - 100.0 fL   MCH 35.4 (H) 26.0 - 34.0 pg   MCHC 35.7 30.0 - 36.0 g/dL   RDW 17.2 (H) 11.5 - 15.5 %   Platelets 154 150 - 400 K/uL   Neutrophils Relative % 65 43 - 77 %   Neutro Abs 3.2 1.7 - 7.7 K/uL   Lymphocytes Relative 16 12 - 46 %   Lymphs Abs 0.8 0.7 - 4.0 K/uL   Monocytes Relative 18 (H) 3 - 12 %   Monocytes Absolute 0.9 0.1 - 1.0 K/uL   Eosinophils Relative 0 0 - 5 %   Eosinophils  Absolute 0.0 0.0 - 0.7 K/uL   Basophils Relative 1 0 - 1 %   Basophils Absolute 0.0 0.0 - 0.1 K/uL  Comprehensive metabolic panel     Status: Abnormal   Collection Time: 04/02/15  5:00 PM  Result Value Ref Range   Sodium 136 135 - 145 mmol/L   Potassium 4.1 3.5 - 5.1 mmol/L   Chloride 96 96 - 112 mmol/L   CO2 21 19 - 32 mmol/L   Glucose, Bld 132 (H) 70 - 99 mg/dL   BUN 9 6 - 23 mg/dL   Creatinine, Ser 1.07 0.50 - 1.35 mg/dL   Calcium 8.9 8.4 - 10.5 mg/dL   Total Protein 6.6 6.0 - 8.3 g/dL   Albumin 3.5 3.5 - 5.2 g/dL   AST 122 (H) 0 - 37 U/L   ALT 69 (H) 0 - 53 U/L   Alkaline Phosphatase 74 39 - 117 U/L   Total Bilirubin 1.8 (H) 0.3 - 1.2 mg/dL   GFR calc non Af Amer 70 (L) >90 mL/min   GFR calc Af Amer 81 (L) >90 mL/min    Comment: (NOTE) The eGFR has been calculated using the CKD EPI equation. This calculation has not been validated in all clinical situations. eGFR's persistently <90 mL/min signify possible Chronic Kidney Disease.    Anion  gap 19 (H) 5 - 15  Ethanol     Status: Abnormal   Collection Time: 04/02/15  5:00 PM  Result Value Ref Range   Alcohol, Ethyl (B) 70 (H) 0 - 9 mg/dL    Comment:        LOWEST DETECTABLE LIMIT FOR SERUM ALCOHOL IS 11 mg/dL FOR MEDICAL PURPOSES ONLY   I-stat troponin, ED     Status: None   Collection Time: 04/02/15  5:17 PM  Result Value Ref Range   Troponin i, poc 0.01 0.00 - 0.08 ng/mL   Comment 3            Comment: Due to the release kinetics of cTnI, a negative result within the first hours of the onset of symptoms does not rule out myocardial infarction with certainty. If myocardial infarction is still suspected, repeat the test at appropriate intervals.   Urinalysis, Routine w reflex microscopic     Status: Abnormal   Collection Time: 04/02/15  8:13 PM  Result Value Ref Range   Color, Urine ORANGE (A) YELLOW    Comment: BIOCHEMICALS MAY BE AFFECTED BY COLOR   APPearance CLEAR CLEAR   Specific Gravity, Urine 1.020 1.005 - 1.030   pH 6.0 5.0 - 8.0   Glucose, UA NEGATIVE NEGATIVE mg/dL   Hgb urine dipstick NEGATIVE NEGATIVE   Bilirubin Urine MODERATE (A) NEGATIVE   Ketones, ur 15 (A) NEGATIVE mg/dL   Protein, ur NEGATIVE NEGATIVE mg/dL   Urobilinogen, UA 2.0 (H) 0.0 - 1.0 mg/dL   Nitrite NEGATIVE NEGATIVE   Leukocytes, UA TRACE (A) NEGATIVE  Urine microscopic-add on     Status: Abnormal   Collection Time: 04/02/15  8:13 PM  Result Value Ref Range   Squamous Epithelial / LPF RARE RARE   WBC, UA 0-2 <3 WBC/hpf   Bacteria, UA MANY (A) RARE  TSH     Status: None   Collection Time: 04/02/15 10:48 PM  Result Value Ref Range   TSH 3.700 0.350 - 4.500 uIU/mL  Ammonia     Status: Abnormal   Collection Time: 04/02/15 10:48 PM  Result Value Ref Range   Ammonia 35 (H) 11 -  32 umol/L  Hepatitis panel, acute     Status: None   Collection Time: 04/02/15 10:48 PM  Result Value Ref Range   Hepatitis B Surface Ag NEGATIVE NEGATIVE   HCV Ab NEGATIVE NEGATIVE   Hep A IgM NON  REACTIVE NON REACTIVE    Comment: (NOTE) Effective November 03, 2014, Hepatitis Acute Panel (test code [phone]) will be revised to automatically reflex to the Hepatitis C Viral RNA, Quantitative, Real-Time PCR assay if the Hepatitis C antibody screening result is Reactive. This action is being taken to ensure that the CDC/USPSTF recommended HCV diagnostic algorithm with the appropriate test reflex needed for accurate interpretation is followed.    Hep B C IgM NON REACTIVE NON REACTIVE    Comment: (NOTE) High levels of Hepatitis B Core IgM antibody are detectable during the acute stage of Hepatitis B. This antibody is used to differentiate current from past HBV infection. Performed at [facility]   Acetaminophen level     Status: Abnormal   Collection Time: 04/02/15 10:48 PM  Result Value Ref Range   Acetaminophen (Tylenol), Serum <10.0 (L) 10 - 30 ug/mL    Comment:        THERAPEUTIC CONCENTRATIONS VARY SIGNIFICANTLY. A RANGE OF 10-30 ug/mL MAY BE AN EFFECTIVE CONCENTRATION FOR MANY PATIENTS. HOWEVER, SOME ARE BEST TREATED AT CONCENTRATIONS OUTSIDE THIS RANGE. ACETAMINOPHEN CONCENTRATIONS >150 ug/mL AT 4 HOURS AFTER INGESTION AND >50 ug/mL AT 12 HOURS AFTER INGESTION ARE OFTEN ASSOCIATED WITH TOXIC REACTIONS.   Vitamin B12     Status: None   Collection Time: 04/02/15 10:48 PM  Result Value Ref Range   Vitamin B-12 583 211 - 911 pg/mL    Comment: Performed at [facility]  Folate     Status: None   Collection Time: 04/02/15 10:48 PM  Result Value Ref Range   Folate 12.3 ng/mL    Comment: (NOTE) Reference Ranges        Deficient:       0.4 - 3.3 ng/mL        Indeterminate:   3.4 - 5.4 ng/mL        Normal:              > 5.4 ng/mL Performed at [facility]   Comprehensive metabolic panel     Status: Abnormal   Collection Time: 04/03/15  5:35 AM  Result Value Ref Range   Sodium 135 135 - 145 mmol/L   Potassium 2.6 (LL) 3.5 - 5.1 mmol/L     Comment: REPEATED TO VERIFY CRITICAL RESULT CALLED TO, READ BACK BY AND VERIFIED WITH: A.JETER,RN [id number] 04/03/15 CLARK,S    Chloride 96 96 - 112 mmol/L   CO2 25 19 - 32 mmol/L   Glucose, Bld 114 (H) 70 - 99 mg/dL   BUN 12 6 - 23 mg/dL   Creatinine, Ser 1.13 0.50 - 1.35 mg/dL   Calcium 8.0 (L) 8.4 - 10.5 mg/dL   Total Protein 5.6 (L) 6.0 - 8.3 g/dL   Albumin 3.0 (L) 3.5 - 5.2 g/dL   AST 94 (H) 0 - 37 U/L   ALT 54 (H) 0 - 53 U/L   Alkaline Phosphatase 64 39 - 117 U/L   Total Bilirubin 1.7 (H) 0.3 - 1.2 mg/dL   GFR calc non Af Amer 65 (L) >90 mL/min   GFR calc Af Amer 76 (L) >90 mL/min    Comment: (NOTE) The eGFR has been calculated using the CKD EPI equation. This calculation has  not been validated in all clinical situations. eGFR's persistently <90 mL/min signify possible Chronic Kidney Disease.    Anion gap 14 5 - 15  CBC WITH DIFFERENTIAL     Status: Abnormal   Collection Time: 04/03/15  5:35 AM  Result Value Ref Range   WBC 3.5 (L) 4.0 - 10.5 K/uL   RBC 2.84 (L) 4.22 - 5.81 MIL/uL   Hemoglobin 9.9 (L) 13.0 - 17.0 g/dL    Comment: DELTA CHECK NOTED REPEATED TO VERIFY    HCT 28.8 (L) 39.0 - 52.0 %   MCV 101.4 (H) 78.0 - 100.0 fL   MCH 34.9 (H) 26.0 - 34.0 pg   MCHC 34.4 30.0 - 36.0 g/dL    Comment: CORRECTED FOR COLD AGGLUTININS   RDW 17.4 (H) 11.5 - 15.5 %   Platelets 96 (L) 150 - 400 K/uL    Comment: SPECIMEN CHECKED FOR CLOTS PLATELET COUNT CONFIRMED BY SMEAR REPEATED TO VERIFY    Neutrophils Relative % 57 43 - 77 %   Neutro Abs 2.0 1.7 - 7.7 K/uL   Lymphocytes Relative 25 12 - 46 %   Lymphs Abs 0.9 0.7 - 4.0 K/uL   Monocytes Relative 17 (H) 3 - 12 %   Monocytes Absolute 0.6 0.1 - 1.0 K/uL   Eosinophils Relative 0 0 - 5 %   Eosinophils Absolute 0.0 0.0 - 0.7 K/uL   Basophils Relative 1 0 - 1 %   Basophils Absolute 0.0 0.0 - 0.1 K/uL  Magnesium     Status: Abnormal   Collection Time: 04/03/15  5:35 AM  Result Value Ref Range   Magnesium 1.4 (L) 1.5 - 2.5  mg/dL  Reticulocytes     Status: Abnormal   Collection Time: 04/03/15 12:50 PM  Result Value Ref Range   Retic Ct Pct 3.3 (H) 0.4 - 3.1 %   RBC. 3.03 (L) 4.22 - 5.81 MIL/uL   Retic Count, Manual 100.0 19.0 - 186.0 K/uL  Hemoglobin and hematocrit, blood     Status: Abnormal   Collection Time: 04/03/15 12:50 PM  Result Value Ref Range   Hemoglobin 10.6 (L) 13.0 - 17.0 g/dL   HCT 30.5 (L) 39.0 - 52.0 %  CK     Status: None   Collection Time: 04/03/15 12:50 PM  Result Value Ref Range   Total CK 108 7 - 232 U/L  Phosphorus     Status: Abnormal   Collection Time: 04/03/15 12:50 PM  Result Value Ref Range   Phosphorus 1.4 (L) 2.3 - 4.6 mg/dL    Dg Chest 2 View  04/02/2015   CLINICAL DATA:  Multiple falls over the last 3 days with increasing malaise and loss of bladder control. Initial encounter.  EXAM: CHEST  2 VIEW  COMPARISON:  09/09/2014 and 09/07/2014.  FINDINGS: Lower lung volumes with asymmetric elevation of the right hemidiaphragm and mild associated right basilar atelectasis. No confluent airspace opacity, edema or pleural effusion. The heart size is stable. There is no evidence of acute fracture.  IMPRESSION: Low lung volumes with mild right basilar atelectasis. No evidence of consolidation or acute osseous injury.   Electronically Signed   By: Richardean Sale M.D.   On: 04/02/2015 16:58   Ct Head Wo Contrast  04/02/2015   CLINICAL DATA:  Fatigue, recent falls.  History of CVA.  EXAM: CT HEAD WITHOUT CONTRAST  TECHNIQUE: Contiguous axial images were obtained from the base of the skull through the vertex without intravenous contrast.  COMPARISON:  12/09/2014  FINDINGS: No intracranial hemorrhage, mass effect, or midline shift. No hydrocephalus. The basilar cisterns are patent. Mild atrophy and chronic small vessel ischemic change, stable from prior. No evidence of territorial infarct. No intracranial fluid collection. Calvarium is intact. Mucosal thickening of the maxillary sinuses with  question of left maxillary sinus made retention cyst, unchanged. Mastoid air cells are well aerated.  IMPRESSION: No acute intracranial abnormality. Stable atrophy and chronic small vessel ischemic change.   Electronically Signed   By: Jeb Levering M.D.   On: 04/02/2015 17:37   Mr Brain Wo Contrast  04/03/2015   CLINICAL DATA:  Initial evaluation for acute tremulousness and unsteady gait.  EXAM: MRI HEAD WITHOUT CONTRAST  TECHNIQUE: Multiplanar, multiecho pulse sequences of the brain and surrounding structures were obtained without intravenous contrast.  COMPARISON:  Prior CT from 04/02/2015  FINDINGS: Diffuse prominence of the CSF containing spaces is compatible with generalized cerebral atrophy. Mild chronic small vessel ischemic changes present within the periventricular white matter.  No abnormal foci of restricted diffusion to suggest acute intracranial infarct. Gray-white matter differentiation maintained. No acute or chronic intracranial hemorrhage. Normal intravascular flow voids preserved.  No mass lesion or midline shift. No hydrocephalus. No extra-axial fluid collection.  Craniocervical junction within normal limits. Incidental note made of a partially empty sella. No acute abnormality about the orbits. Mucosal thickening present within the right maxillary sinus, likely chronic in nature. Retention cyst present within the left maxillary sinus. Paranasal sinuses are otherwise clear. Scant fluid signal intensity present within the right mastoid air cells.  Bone marrow signal intensity normal. No scalp soft tissue abnormality.  IMPRESSION: 1. No acute intracranial infarct or other abnormality identified. 2. Generalized cerebral atrophy with mild chronic small vessel ischemic disease.   Electronically Signed   By: Jeannine Boga M.D.   On: 04/03/2015 02:28    ROS negative except above Blood pressure 105/92, pulse 105, temperature 98 F (36.7 C), temperature source Oral, resp. rate 16, height  _0  (1.778 m), weight 99.292 kg (218 lb 14.4 oz), SpO2 98 %. Physical Exam vital signs stable afebrile no acute distress answering all questions appropriately although a little lethargic and slurring speech a little abdomen is pertinent for increased since I last seen him but soft nontender labs reviewed  Assessment/Plan: Bright red blood per rectum and anemia in a patient with alcohol depression medicine problems Plan: We'll ask my partner to check on him tomorrow and if no signs of active bleeding in the hospital I am happy to see back in the office to set up an outpatient colonoscopy otherwise if inpatient bleeding might need further workup and plans and we encouraged to quit alcohol and consider either an ultrasound or CT of his liver at some point [facility] E 04/03/2015, 3:10 PM

## 2015-04-04 DIAGNOSIS — E876 Hypokalemia: Secondary | ICD-10-CM

## 2015-04-04 LAB — CBC WITH DIFFERENTIAL/PLATELET
Basophils Absolute: 0 10*3/uL (ref 0.0–0.1)
Basophils Relative: 1 % (ref 0–1)
Eosinophils Absolute: 0.1 10*3/uL (ref 0.0–0.7)
Eosinophils Relative: 2 % (ref 0–5)
HEMATOCRIT: 31 % — AB (ref 39.0–52.0)
HEMOGLOBIN: 10.9 g/dL — AB (ref 13.0–17.0)
LYMPHS ABS: 0.8 10*3/uL (ref 0.7–4.0)
Lymphocytes Relative: 22 % (ref 12–46)
MCH: 35.4 pg — AB (ref 26.0–34.0)
MCHC: 35.2 g/dL (ref 30.0–36.0)
MCV: 100.6 fL — ABNORMAL HIGH (ref 78.0–100.0)
Monocytes Absolute: 0.6 10*3/uL (ref 0.1–1.0)
Monocytes Relative: 17 % — ABNORMAL HIGH (ref 3–12)
NEUTROS ABS: 2 10*3/uL (ref 1.7–7.7)
Neutrophils Relative %: 58 % (ref 43–77)
Platelets: 123 10*3/uL — ABNORMAL LOW (ref 150–400)
RBC: 3.08 MIL/uL — ABNORMAL LOW (ref 4.22–5.81)
RDW: 17.5 % — ABNORMAL HIGH (ref 11.5–15.5)
WBC: 3.4 10*3/uL — AB (ref 4.0–10.5)

## 2015-04-04 LAB — COMPREHENSIVE METABOLIC PANEL
ALK PHOS: 63 U/L (ref 39–117)
ALT: 52 U/L (ref 0–53)
AST: 86 U/L — ABNORMAL HIGH (ref 0–37)
Albumin: 3 g/dL — ABNORMAL LOW (ref 3.5–5.2)
Anion gap: 16 — ABNORMAL HIGH (ref 5–15)
BUN: 8 mg/dL (ref 6–23)
CALCIUM: 7.8 mg/dL — AB (ref 8.4–10.5)
CHLORIDE: 97 mmol/L (ref 96–112)
CO2: 23 mmol/L (ref 19–32)
CREATININE: 0.94 mg/dL (ref 0.50–1.35)
GFR calc Af Amer: >90 mL/min (ref >90)
GFR, EST NON AFRICAN AMERICAN: 85 mL/min — AB (ref >90)
GLUCOSE: 105 mg/dL — AB (ref 70–99)
Potassium: 3.1 mmol/L — ABNORMAL LOW (ref 3.5–5.1)
Sodium: 136 mmol/L (ref 135–145)
TOTAL PROTEIN: 5.9 g/dL — AB (ref 6.0–8.3)
Total Bilirubin: 1.3 mg/dL — ABNORMAL HIGH (ref 0.3–1.2)

## 2015-04-04 LAB — HEMOGLOBIN AND HEMATOCRIT, BLOOD
HCT: 31.5 % — ABNORMAL LOW (ref 39.0–52.0)
HCT: 32.6 % — ABNORMAL LOW (ref 39.0–52.0)
Hemoglobin: 10.9 g/dL — ABNORMAL LOW (ref 13.0–17.0)
Hemoglobin: 11.3 g/dL — ABNORMAL LOW (ref 13.0–17.0)

## 2015-04-04 LAB — MAGNESIUM: Magnesium: 1.9 mg/dL (ref 1.5–2.5)

## 2015-04-04 LAB — URINE CULTURE
CULTURE: NO GROWTH
Colony Count: NO GROWTH

## 2015-04-04 LAB — PHOSPHORUS: Phosphorus: 1.9 mg/dL — ABNORMAL LOW (ref 2.3–4.6)

## 2015-04-04 MED ORDER — POTASSIUM CHLORIDE CRYS ER 20 MEQ PO TBCR
40.0000 meq | EXTENDED_RELEASE_TABLET | Freq: Two times a day (BID) | ORAL | Status: AC
  Administered 2015-04-04 (×2): 40 meq via ORAL
  Filled 2015-04-04 (×2): qty 2

## 2015-04-04 MED ORDER — SODIUM PHOSPHATE 3 MMOLE/ML IV SOLN
30.0000 mmol | Freq: Once | INTRAVENOUS | Status: AC
  Administered 2015-04-04: 30 mmol via INTRAVENOUS
  Filled 2015-04-04: qty 10

## 2015-04-04 NOTE — Progress Notes (Signed)
[facility] PROGRESS NOTE Subjective No gross bleeding, still doesn't feel good. No gross diarrhea.  Objective: Vital signs in last 24 hours: Temp:  [97.9 F (36.6 C)-99.3 F (37.4 C)] 98.4 F (36.9 C) (04/16 0604) Pulse Rate:  [85-105] 99 (04/16 0604) Resp:  [16-21] 21 (04/16 0604) BP: (105-123)/(69-92) 120/80 mmHg (04/16 0604) SpO2:  [94 %-99 %] 94 % (04/16 0604) Last BM Date: 04/03/15  Intake/Output from previous day: 04/15 0701 - 04/16 0700 In: 3241 [P.O.:480; I.V.:2095; IV Piggyback:666] Out: 200 [Urine:200] Intake/Output this shift:    PE: General--alert somewhat shakey  Abdomen--nontender  Lab Results:  Recent Labs  04/02/15 1700 04/03/15 0535 04/03/15 1250 04/03/15 1936  WBC 4.9 3.5*  --   --   HGB 13.0 9.9* 10.6* 11.0*  HCT 36.4* 28.8* 30.5* 31.5*  PLT 154 96*  --   --    BMET  Recent Labs  04/02/15 1700 04/03/15 0535 04/03/15 1936  NA 136 135  --   K 4.1 2.6* 3.0*  CL 96 96  --   CO2 21 25  --   CREATININE 1.07 1.13  --    LFT  Recent Labs  04/02/15 1700 04/03/15 0535  PROT 6.6 5.6*  AST 122* 94*  ALT 69* 54*  ALKPHOS 74 64  BILITOT 1.8* 1.7*   PT/INR No results for input(s): LABPROT, INR in the last 72 hours. PANCREAS No results for input(s): LIPASE in the last 72 hours.       Studies/Results: Dg Chest 2 View  04/02/2015   CLINICAL DATA:  Multiple falls over the last 3 days with increasing malaise and loss of bladder control. Initial encounter.  EXAM: CHEST  2 VIEW  COMPARISON:  09/09/2014 and 09/07/2014.  FINDINGS: Lower lung volumes with asymmetric elevation of the right hemidiaphragm and mild associated right basilar atelectasis. No confluent airspace opacity, edema or pleural effusion. The heart size is stable. There is no evidence of acute fracture.  IMPRESSION: Low lung volumes with mild right basilar atelectasis. No evidence of consolidation or acute osseous injury.   Electronically Signed   By: Richardean Sale  M.D.   On: 04/02/2015 16:58   Ct Head Wo Contrast  04/02/2015   CLINICAL DATA:  Fatigue, recent falls.  History of CVA.  EXAM: CT HEAD WITHOUT CONTRAST  TECHNIQUE: Contiguous axial images were obtained from the base of the skull through the vertex without intravenous contrast.  COMPARISON:  12/09/2014  FINDINGS: No intracranial hemorrhage, mass effect, or midline shift. No hydrocephalus. The basilar cisterns are patent. Mild atrophy and chronic small vessel ischemic change, stable from prior. No evidence of territorial infarct. No intracranial fluid collection. Calvarium is intact. Mucosal thickening of the maxillary sinuses with question of left maxillary sinus made retention cyst, unchanged. Mastoid air cells are well aerated.  IMPRESSION: No acute intracranial abnormality. Stable atrophy and chronic small vessel ischemic change.   Electronically Signed   By: Jeb Levering M.D.   On: 04/02/2015 17:37   Mr Brain Wo Contrast  04/03/2015   CLINICAL DATA:  Initial evaluation for acute tremulousness and unsteady gait.  EXAM: MRI HEAD WITHOUT CONTRAST  TECHNIQUE: Multiplanar, multiecho pulse sequences of the brain and surrounding structures were obtained without intravenous contrast.  COMPARISON:  Prior CT from 04/02/2015  FINDINGS: Diffuse prominence of the CSF containing spaces is compatible with generalized cerebral atrophy. Mild chronic small vessel ischemic changes present within the periventricular white matter.  No abnormal foci of restricted diffusion to suggest acute intracranial  infarct. Gray-white matter differentiation maintained. No acute or chronic intracranial hemorrhage. Normal intravascular flow voids preserved.  No mass lesion or midline shift. No hydrocephalus. No extra-axial fluid collection.  Craniocervical junction within normal limits. Incidental note made of a partially empty sella. No acute abnormality about the orbits. Mucosal thickening present within the right maxillary sinus, likely  chronic in nature. Retention cyst present within the left maxillary sinus. Paranasal sinuses are otherwise clear. Scant fluid signal intensity present within the right mastoid air cells.  Bone marrow signal intensity normal. No scalp soft tissue abnormality.  IMPRESSION: 1. No acute intracranial infarct or other abnormality identified. 2. Generalized cerebral atrophy with mild chronic small vessel ischemic disease.   Electronically Signed   By: Jeannine Boga M.D.   On: 04/03/2015 02:28   Ct Abdomen Pelvis W Contrast  04/03/2015   CLINICAL DATA:  Elevated liver function studies. Fatigue. Rectal bleeding. Anemia. ETOH abuse.  EXAM: CT ABDOMEN AND PELVIS WITH CONTRAST  TECHNIQUE: Multidetector CT imaging of the abdomen and pelvis was performed using the standard protocol following bolus administration of intravenous contrast.  CONTRAST:  134mL OMNIPAQUE IOHEXOL 300 MG/ML  SOLN  COMPARISON:  11/16/2011  FINDINGS: Lower chest: The lung bases are clear except for dependent atelectasis. No pleural effusions or worrisome pulmonary lesions. The heart is normal in size. Moderate epicardial and pericardial fat. Three-vessel coronary artery calcifications are noted. The distal esophagus is grossly normal.  Hepatobiliary: Severe and diffuse fatty infiltration of the liver but no focal hepatic lesions are identified. The liver contour is slightly irregular but I do not see any definite other findings for cirrhosis. The portal and hepatic veins are patent. No intrahepatic biliary dilatation. There appear to be chronic changes of adenomyomatosis involving the gallbladder. No common bile duct dilatation.  Pancreas: Normal.  Spleen: Scattered small calcified granulomas. No focal lesions or splenomegaly.  Adrenals/Urinary Tract: The adrenal glands are unremarkable. No renal lesions or hydronephrosis.  Stomach/Bowel: These stomach, duodenum, small bowel and colon are unremarkable. No inflammatory changes, mass lesions or  obstructive findings. The terminal ileum is normal. The appendix is normal. Colonic diverticulosis without findings for acute diverticulitis.  Vascular/Lymphatic: No mesenteric or retroperitoneal mass or adenopathy. The aorta and branch vessels are patent. The major venous structures are patent.  Other: The bladder, prostate gland and seminal vesicles are unremarkable. No inguinal mass or adenopathy.  Musculoskeletal: The bony structures are unremarkable.  IMPRESSION: 1. Severe and diffuse fatty infiltration of the liver but no focal hepatic lesions. The mildly irregular liver contour but no other findings to suggest cirrhosis. 2. Suspect chronic changes of adenomyomatosis involving the gallbladder. 3. No acute abdominal/pelvic findings, mass lesions or lymphadenopathy.   Electronically Signed   By: Marijo Sanes M.D.   On: 04/03/2015 20:45    Medications: I have reviewed the patient's current medications.  Assessment/Plan: 1. Rectal Bleeding. Has had radiation proctitis and hemorrhoids on previous colonoscopy. Bleeding seems to have stopped. Can f/u with Dr Watt Climes after discharge. Will sign off please call for any problems.   Carla Rashad JR,Triston Skare L 04/04/2015, 7:34 AM  Diet:  No Change  Follow-up Appointment: Dr Watt Climes 1 month after discharge.

## 2015-04-04 NOTE — Progress Notes (Signed)
[facility] PROGRESS NOTE  Joshua Henson EHM:[id number] DOB: August 14, 1946 DOA: 04/02/2015 PCP: Adella Hare, MD  Assessment/Plan: 1. Generalized weakness/ fatigue and gait instability and some tremors: Initially admitted for evaluation of CVA, his MRI brain is negative for acute stroke. In view of his alcohol abuse, he is being treated for alcohol withdrawal and benzo withdrawal. Neurology consulted and recommendations given. Meanwhile PT eval for deconditioning. His vitamain and b12 levels are within normal limits. His CK level is normal. TSH within normal limits His mag and phos are low, repleted them. Repeat levels in am.    Hypertension: well controlled.    Transaminitis Probably secondary to alcohol abuse, suspect liver cirrhosis in view of his thrombocytopenia and transaminitis.  CT abdomen and pelvis ordered for further evaluation shows diffuse fatty infiltration. Hepatitis panel negative.    Blood per rectum: Resolved. GI consulted and recommendations given.  Stopped coreg and watch his hemoglobin. Anemia panel ordered.  Repeat H&h is stable.   Hypokalemia: replete as needed.    Hypomagnesemia: Replete as needed.   Hypophosphatemia: Replace as needed.   UTI: Urine cultures negative. Rocephin discontinued.        Code Status: full code.  Family Communication: discussed in dtail with the gf at bedside Disposition Plan: pending PT eval.    Consultants:  gastroenterology  Procedures:  NONE  Antibiotics:  none  HPI/Subjective: Feeling weak and exhausted. But better than yesterday.   Objective: Filed Vitals:   04/04/15 1754  BP: 145/95  Pulse:   Temp: 98.1 F (36.7 C)  Resp: 22    Intake/Output Summary (Last 24 hours) at 04/04/15 1803 Last data filed at 04/04/15 1729  Gross per 24 hour  Intake 1179.58 ml  Output    200 ml  Net 979.58 ml   Filed Weights   04/02/15 1530 04/02/15 2103  Weight: 95.255 kg (210 lb) 99.292 kg (218 lb 14.4  oz)    Exam:   General:  Alert afebrile not in any distress  Cardiovascular: s1s2  Respiratory: clear to auscultation, no wheezing or rhonchi  Abdomen: soft non tender non distended bowel sounds heard  Musculoskeletal: trace pedal edema   Data Reviewed: Basic Metabolic Panel:  Recent Labs Lab 04/02/15 1700 04/03/15 0535 04/03/15 1250 04/03/15 1936 04/04/15 0658 04/04/15 1200  NA 136 135  --   --  136  --   K 4.1 2.6*  --  3.0* 3.1*  --   CL 96 96  --   --  97  --   CO2 21 25  --   --  23  --   GLUCOSE 132* 114*  --   --  105*  --   BUN 9 12  --   --  8  --   CREATININE 1.07 1.13  --   --  0.94  --   CALCIUM 8.9 8.0*  --   --  7.8*  --   MG  --  1.4*  --   --   --  1.9  PHOS  --   --  1.4*  --   --  1.9*   Liver Function Tests:  Recent Labs Lab 04/02/15 1700 04/03/15 0535 04/04/15 0658  AST 122* 94* 86*  ALT 69* 54* 52  ALKPHOS 74 64 63  BILITOT 1.8* 1.7* 1.3*  PROT 6.6 5.6* 5.9*  ALBUMIN 3.5 3.0* 3.0*   No results for input(s): LIPASE, AMYLASE in the last 168 hours.  Recent Labs Lab 04/02/15 2248  AMMONIA 35*   CBC:  Recent Labs Lab 04/02/15 1700 04/03/15 0535 04/03/15 1250 04/03/15 1936 04/04/15 0658 04/04/15 1200  WBC 4.9 3.5*  --   --  3.4*  --   NEUTROABS 3.2 2.0  --   --  2.0  --   HGB 13.0 9.9* 10.6* 11.0* 10.9* 10.9*  HCT 36.4* 28.8* 30.5* 31.5* 31.0* 31.5*  MCV 99.2 101.4*  --   --  100.6*  --   PLT 154 96*  --   --  123*  --    Cardiac Enzymes:  Recent Labs Lab 04/03/15 1250  CKTOTAL 108   BNP (last 3 results) No results for input(s): BNP in the last 8760 hours.  ProBNP (last 3 results)  Recent Labs  09/03/14 0520  PROBNP 12.6    CBG: No results for input(s): GLUCAP in the last 168 hours.  Recent Results (from the past 240 hour(s))  Urine culture     Status: None   Collection Time: 04/02/15  8:13 PM  Result Value Ref Range Status   Specimen Description URINE, CATHETERIZED  Final   Special Requests NONE   Final   Colony Count NO GROWTH Performed at [facility]   Final   Culture NO GROWTH Performed at [facility]   Final   Report Status 04/04/2015 FINAL  Final     Studies: Mr Brain Wo Contrast  04/03/2015   CLINICAL DATA:  Initial evaluation for acute tremulousness and unsteady gait.  EXAM: MRI HEAD WITHOUT CONTRAST  TECHNIQUE: Multiplanar, multiecho pulse sequences of the brain and surrounding structures were obtained without intravenous contrast.  COMPARISON:  Prior CT from 04/02/2015  FINDINGS: Diffuse prominence of the CSF containing spaces is compatible with generalized cerebral atrophy. Mild chronic small vessel ischemic changes present within the periventricular white matter.  No abnormal foci of restricted diffusion to suggest acute intracranial infarct. Gray-white matter differentiation maintained. No acute or chronic intracranial hemorrhage. Normal intravascular flow voids preserved.  No mass lesion or midline shift. No hydrocephalus. No extra-axial fluid collection.  Craniocervical junction within normal limits. Incidental note made of a partially empty sella. No acute abnormality about the orbits. Mucosal thickening present within the right maxillary sinus, likely chronic in nature. Retention cyst present within the left maxillary sinus. Paranasal sinuses are otherwise clear. Scant fluid signal intensity present within the right mastoid air cells.  Bone marrow signal intensity normal. No scalp soft tissue abnormality.  IMPRESSION: 1. No acute intracranial infarct or other abnormality identified. 2. Generalized cerebral atrophy with mild chronic small vessel ischemic disease.   Electronically Signed   By: Jeannine Boga M.D.   On: 04/03/2015 02:28   Ct Abdomen Pelvis W Contrast  04/03/2015   CLINICAL DATA:  Elevated liver function studies. Fatigue. Rectal bleeding. Anemia. ETOH abuse.  EXAM: CT ABDOMEN AND PELVIS WITH CONTRAST  TECHNIQUE: Multidetector CT imaging of  the abdomen and pelvis was performed using the standard protocol following bolus administration of intravenous contrast.  CONTRAST:  158mL OMNIPAQUE IOHEXOL 300 MG/ML  SOLN  COMPARISON:  11/16/2011  FINDINGS: Lower chest: The lung bases are clear except for dependent atelectasis. No pleural effusions or worrisome pulmonary lesions. The heart is normal in size. Moderate epicardial and pericardial fat. Three-vessel coronary artery calcifications are noted. The distal esophagus is grossly normal.  Hepatobiliary: Severe and diffuse fatty infiltration of the liver but no focal hepatic lesions are identified. The liver contour is slightly irregular but I do not see any definite  other findings for cirrhosis. The portal and hepatic veins are patent. No intrahepatic biliary dilatation. There appear to be chronic changes of adenomyomatosis involving the gallbladder. No common bile duct dilatation.  Pancreas: Normal.  Spleen: Scattered small calcified granulomas. No focal lesions or splenomegaly.  Adrenals/Urinary Tract: The adrenal glands are unremarkable. No renal lesions or hydronephrosis.  Stomach/Bowel: These stomach, duodenum, small bowel and colon are unremarkable. No inflammatory changes, mass lesions or obstructive findings. The terminal ileum is normal. The appendix is normal. Colonic diverticulosis without findings for acute diverticulitis.  Vascular/Lymphatic: No mesenteric or retroperitoneal mass or adenopathy. The aorta and branch vessels are patent. The major venous structures are patent.  Other: The bladder, prostate gland and seminal vesicles are unremarkable. No inguinal mass or adenopathy.  Musculoskeletal: The bony structures are unremarkable.  IMPRESSION: 1. Severe and diffuse fatty infiltration of the liver but no focal hepatic lesions. The mildly irregular liver contour but no other findings to suggest cirrhosis. 2. Suspect chronic changes of adenomyomatosis involving the gallbladder. 3. No acute  abdominal/pelvic findings, mass lesions or lymphadenopathy.   Electronically Signed   By: Marijo Sanes M.D.   On: 04/03/2015 20:45    Scheduled Meds: . atorvastatin  10 mg Oral q1800  . chlordiazePOXIDE  25 mg Oral QHS  . folic acid  1 mg Oral Daily  . [START ON 04/05/2015] LORazepam  0-4 mg Intravenous Q12H  . multivitamin with minerals  1 tablet Oral Daily  . potassium chloride  40 mEq Oral BID  . thiamine  100 mg Oral Daily   Continuous Infusions: . sodium chloride 1,000 mL (04/04/15 1758)    Active Problems:   Alcohol abuse   Alcohol withdrawal   Tremor   Ataxia   Hypokalemia   Hypomagnesemia    Time spent: 35 minutes    Joshua Henson  [facility] Pager [phone] If 7PM-7AM, please contact night-coverage at [email or web address], password [facility] 04/04/2015, 6:03 PM  LOS: 1 day

## 2015-04-05 LAB — CBC WITH DIFFERENTIAL/PLATELET
Basophils Absolute: 0 10*3/uL (ref 0.0–0.1)
Basophils Relative: 1 % (ref 0–1)
Eosinophils Absolute: 0.1 10*3/uL (ref 0.0–0.7)
Eosinophils Relative: 2 % (ref 0–5)
HEMATOCRIT: 31.8 % — AB (ref 39.0–52.0)
Hemoglobin: 10.7 g/dL — ABNORMAL LOW (ref 13.0–17.0)
LYMPHS ABS: 1 10*3/uL (ref 0.7–4.0)
LYMPHS PCT: 29 % (ref 12–46)
MCH: 34.7 pg — AB (ref 26.0–34.0)
MCHC: 33.6 g/dL (ref 30.0–36.0)
MCV: 103.2 fL — AB (ref 78.0–100.0)
Monocytes Absolute: 0.6 10*3/uL (ref 0.1–1.0)
Monocytes Relative: 17 % — ABNORMAL HIGH (ref 3–12)
NEUTROS ABS: 1.7 10*3/uL (ref 1.7–7.7)
Neutrophils Relative %: 51 % (ref 43–77)
PLATELETS: 135 10*3/uL — AB (ref 150–400)
RBC: 3.08 MIL/uL — ABNORMAL LOW (ref 4.22–5.81)
RDW: 18.1 % — ABNORMAL HIGH (ref 11.5–15.5)
WBC: 3.3 10*3/uL — AB (ref 4.0–10.5)

## 2015-04-05 LAB — PHOSPHORUS: PHOSPHORUS: 3.5 mg/dL (ref 2.3–4.6)

## 2015-04-05 LAB — COMPREHENSIVE METABOLIC PANEL
ALBUMIN: 2.7 g/dL — AB (ref 3.5–5.2)
ALT: 44 U/L (ref 0–53)
AST: 69 U/L — ABNORMAL HIGH (ref 0–37)
Alkaline Phosphatase: 54 U/L (ref 39–117)
Anion gap: 11 (ref 5–15)
BUN: <5 mg/dL — ABNORMAL LOW (ref 6–23)
CALCIUM: 7.4 mg/dL — AB (ref 8.4–10.5)
CO2: 23 mmol/L (ref 19–32)
Chloride: 104 mmol/L (ref 96–112)
Creatinine, Ser: 0.93 mg/dL (ref 0.50–1.35)
GFR calc Af Amer: >90 mL/min (ref >90)
GFR calc non Af Amer: 85 mL/min — ABNORMAL LOW (ref >90)
GLUCOSE: 103 mg/dL — AB (ref 70–99)
Potassium: 3.2 mmol/L — ABNORMAL LOW (ref 3.5–5.1)
Sodium: 138 mmol/L (ref 135–145)
Total Bilirubin: 1.1 mg/dL (ref 0.3–1.2)
Total Protein: 5.3 g/dL — ABNORMAL LOW (ref 6.0–8.3)

## 2015-04-05 LAB — MAGNESIUM: Magnesium: 1.7 mg/dL (ref 1.5–2.5)

## 2015-04-05 MED ORDER — POTASSIUM CHLORIDE CRYS ER 20 MEQ PO TBCR
40.0000 meq | EXTENDED_RELEASE_TABLET | Freq: Three times a day (TID) | ORAL | Status: DC
  Administered 2015-04-05 – 2015-04-06 (×2): 40 meq via ORAL
  Filled 2015-04-05 (×3): qty 2

## 2015-04-05 MED ORDER — POTASSIUM CHLORIDE CRYS ER 20 MEQ PO TBCR
40.0000 meq | EXTENDED_RELEASE_TABLET | Freq: Two times a day (BID) | ORAL | Status: DC
  Administered 2015-04-05: 40 meq via ORAL
  Filled 2015-04-05: qty 2

## 2015-04-05 MED ORDER — MAGNESIUM SULFATE 2 GM/50ML IV SOLN
2.0000 g | Freq: Once | INTRAVENOUS | Status: AC
  Administered 2015-04-05: 2 g via INTRAVENOUS
  Filled 2015-04-05: qty 50

## 2015-04-05 NOTE — Progress Notes (Signed)
[facility] PROGRESS NOTE  AZAVION BOUILLON IOX:[id number] DOB: 1946-01-27 DOA: 04/02/2015 PCP: Adella Hare, MD  Assessment/Plan: 1. Generalized weakness/ fatigue and gait instability and some tremors: Initially admitted for evaluation of CVA, his MRI brain is negative for acute stroke. In view of his alcohol abuse, he is being treated for alcohol withdrawal and benzo withdrawal. Neurology consulted and recommendations given. Meanwhile PT eval for deconditioning. His vitamain and b12 levels are within normal limits. His CK level is normal. TSH within normal limits His mag and phos are low, repleted them. Repeat levels in am show much improvement.    Hypertension: well controlled.    Transaminitis Probably secondary to alcohol abuse, suspect liver cirrhosis in view of his thrombocytopenia and transaminitis.  CT abdomen and pelvis ordered for further evaluation shows liver has diffuse fatty infiltration. Hepatitis panel negative.    Blood per rectum: Resolved. GI consulted and recommendations given.  Stopped coreg and watch his hemoglobin. Anemia panel ordered.  Repeat H&h is stable.   Hypokalemia: replete as needed.    Hypomagnesemia: Replete as needed.   Hypophosphatemia: Replace as needed.   UTI: Urine cultures negative. Rocephin discontinued.   H/o cdiff: stool for C DIFF ordered and pending.        Code Status: full code.  Family Communication: none at bedside.  Disposition Plan:SNF on discharge.    Consultants:  gastroenterology  Procedures:  NONE  Antibiotics:  none  HPI/Subjective: Reports being anxious.  Objective: Filed Vitals:   04/05/15 1200  BP:   Pulse: 96  Temp:   Resp:     Intake/Output Summary (Last 24 hours) at 04/05/15 1646 Last data filed at 04/05/15 1503  Gross per 24 hour  Intake   1950 ml  Output    500 ml  Net   1450 ml   Filed Weights   04/02/15 1530 04/02/15 2103  Weight: 95.255 kg (210 lb) 99.292 kg (218 lb  14.4 oz)    Exam:   General:  Alert afebrile not in any distress  Cardiovascular: s1s2  Respiratory: clear to auscultation, no wheezing or rhonchi  Abdomen: soft non tender non distended bowel sounds heard  Musculoskeletal: trace pedal edema   Data Reviewed: Basic Metabolic Panel:  Recent Labs Lab 04/02/15 1700 04/03/15 0535 04/03/15 1250 04/03/15 1936 04/04/15 0658 04/04/15 1200 04/05/15 0430  NA 136 135  --   --  136  --  138  K 4.1 2.6*  --  3.0* 3.1*  --  3.2*  CL 96 96  --   --  97  --  104  CO2 21 25  --   --  23  --  23  GLUCOSE 132* 114*  --   --  105*  --  103*  BUN 9 12  --   --  8  --  <5*  CREATININE 1.07 1.13  --   --  0.94  --  0.93  CALCIUM 8.9 8.0*  --   --  7.8*  --  7.4*  MG  --  1.4*  --   --   --  1.9 1.7  PHOS  --   --  1.4*  --   --  1.9* 3.5   Liver Function Tests:  Recent Labs Lab 04/02/15 1700 04/03/15 0535 04/04/15 0658 04/05/15 0430  AST 122* 94* 86* 69*  ALT 69* 54* 52 44  ALKPHOS 74 64 63 54  BILITOT 1.8* 1.7* 1.3* 1.1  PROT 6.6 5.6* 5.9* 5.3*  ALBUMIN 3.5 3.0* 3.0* 2.7*   No results for input(s): LIPASE, AMYLASE in the last 168 hours.  Recent Labs Lab 04/02/15 2248  AMMONIA 35*   CBC:  Recent Labs Lab 04/02/15 1700 04/03/15 0535  04/03/15 1936 04/04/15 0658 04/04/15 1200 04/04/15 1953 04/05/15 0430  WBC 4.9 3.5*  --   --  3.4*  --   --  3.3*  NEUTROABS 3.2 2.0  --   --  2.0  --   --  1.7  HGB 13.0 9.9*  < > 11.0* 10.9* 10.9* 11.3* 10.7*  HCT 36.4* 28.8*  < > 31.5* 31.0* 31.5* 32.6* 31.8*  MCV 99.2 101.4*  --   --  100.6*  --   --  103.2*  PLT 154 96*  --   --  123*  --   --  135*  < > = values in this interval not displayed. Cardiac Enzymes:  Recent Labs Lab 04/03/15 1250  CKTOTAL 108   BNP (last 3 results) No results for input(s): BNP in the last 8760 hours.  ProBNP (last 3 results)  Recent Labs  09/03/14 0520  PROBNP 12.6    CBG: No results for input(s): GLUCAP in the last 168  hours.  Recent Results (from the past 240 hour(s))  Urine culture     Status: None   Collection Time: 04/02/15  8:13 PM  Result Value Ref Range Status   Specimen Description URINE, CATHETERIZED  Final   Special Requests NONE  Final   Colony Count NO GROWTH Performed at [facility]   Final   Culture NO GROWTH Performed at [facility]   Final   Report Status 04/04/2015 FINAL  Final     Studies: Ct Abdomen Pelvis W Contrast  04/03/2015   CLINICAL DATA:  Elevated liver function studies. Fatigue. Rectal bleeding. Anemia. ETOH abuse.  EXAM: CT ABDOMEN AND PELVIS WITH CONTRAST  TECHNIQUE: Multidetector CT imaging of the abdomen and pelvis was performed using the standard protocol following bolus administration of intravenous contrast.  CONTRAST:  155mL OMNIPAQUE IOHEXOL 300 MG/ML  SOLN  COMPARISON:  11/16/2011  FINDINGS: Lower chest: The lung bases are clear except for dependent atelectasis. No pleural effusions or worrisome pulmonary lesions. The heart is normal in size. Moderate epicardial and pericardial fat. Three-vessel coronary artery calcifications are noted. The distal esophagus is grossly normal.  Hepatobiliary: Severe and diffuse fatty infiltration of the liver but no focal hepatic lesions are identified. The liver contour is slightly irregular but I do not see any definite other findings for cirrhosis. The portal and hepatic veins are patent. No intrahepatic biliary dilatation. There appear to be chronic changes of adenomyomatosis involving the gallbladder. No common bile duct dilatation.  Pancreas: Normal.  Spleen: Scattered small calcified granulomas. No focal lesions or splenomegaly.  Adrenals/Urinary Tract: The adrenal glands are unremarkable. No renal lesions or hydronephrosis.  Stomach/Bowel: These stomach, duodenum, small bowel and colon are unremarkable. No inflammatory changes, mass lesions or obstructive findings. The terminal ileum is normal. The appendix is  normal. Colonic diverticulosis without findings for acute diverticulitis.  Vascular/Lymphatic: No mesenteric or retroperitoneal mass or adenopathy. The aorta and branch vessels are patent. The major venous structures are patent.  Other: The bladder, prostate gland and seminal vesicles are unremarkable. No inguinal mass or adenopathy.  Musculoskeletal: The bony structures are unremarkable.  IMPRESSION: 1. Severe and diffuse fatty infiltration of the liver but no focal hepatic lesions. The mildly irregular liver contour but no other findings  to suggest cirrhosis. 2. Suspect chronic changes of adenomyomatosis involving the gallbladder. 3. No acute abdominal/pelvic findings, mass lesions or lymphadenopathy.   Electronically Signed   By: Marijo Sanes M.D.   On: 04/03/2015 20:45    Scheduled Meds: . atorvastatin  10 mg Oral q1800  . chlordiazePOXIDE  25 mg Oral QHS  . folic acid  1 mg Oral Daily  . LORazepam  0-4 mg Intravenous Q12H  . multivitamin with minerals  1 tablet Oral Daily  . potassium chloride  40 mEq Oral BID  . thiamine  100 mg Oral Daily   Continuous Infusions: . sodium chloride 75 mL/hr at 04/05/15 1426    Active Problems:   Alcohol abuse   Alcohol withdrawal   Tremor   Ataxia   Hypokalemia   Hypomagnesemia    Time spent: 35 minutes    Latica Hohmann  [facility] Pager [phone] If 7PM-7AM, please contact night-coverage at [email or web address], password [facility] 04/05/2015, 4:46 PM  LOS: 2 days

## 2015-04-05 NOTE — Progress Notes (Signed)
CIWA changed to q12- CIWA score this morning was 13. No ativan can be given at this time. Patient is complaining that he is very agitated and needs something. NP notified.

## 2015-04-06 LAB — BASIC METABOLIC PANEL
Anion gap: 12 (ref 5–15)
BUN: <5 mg/dL — ABNORMAL LOW (ref 6–23)
CALCIUM: 7.5 mg/dL — AB (ref 8.4–10.5)
CO2: 18 mmol/L — ABNORMAL LOW (ref 19–32)
Chloride: 108 mmol/L (ref 96–112)
Creatinine, Ser: 0.81 mg/dL (ref 0.50–1.35)
GFR calc Af Amer: >90 mL/min (ref >90)
GFR, EST NON AFRICAN AMERICAN: 90 mL/min — AB (ref >90)
Glucose, Bld: 96 mg/dL (ref 70–99)
Potassium: 4.3 mmol/L (ref 3.5–5.1)
SODIUM: 138 mmol/L (ref 135–145)

## 2015-04-06 LAB — CBC WITH DIFFERENTIAL/PLATELET
BASOS PCT: 1 % (ref 0–1)
Basophils Absolute: 0 10*3/uL (ref 0.0–0.1)
EOS ABS: 0.1 10*3/uL (ref 0.0–0.7)
Eosinophils Relative: 2 % (ref 0–5)
HCT: 33.4 % — ABNORMAL LOW (ref 39.0–52.0)
Hemoglobin: 11.2 g/dL — ABNORMAL LOW (ref 13.0–17.0)
Lymphocytes Relative: 25 % (ref 12–46)
Lymphs Abs: 1 10*3/uL (ref 0.7–4.0)
MCH: 35.1 pg — AB (ref 26.0–34.0)
MCHC: 33.5 g/dL (ref 30.0–36.0)
MCV: 104.7 fL — ABNORMAL HIGH (ref 78.0–100.0)
MONOS PCT: 20 % — AB (ref 3–12)
Monocytes Absolute: 0.8 10*3/uL (ref 0.1–1.0)
NEUTROS PCT: 52 % (ref 43–77)
Neutro Abs: 2.1 10*3/uL (ref 1.7–7.7)
PLATELETS: 147 10*3/uL — AB (ref 150–400)
RBC: 3.19 MIL/uL — ABNORMAL LOW (ref 4.22–5.81)
RDW: 18.4 % — ABNORMAL HIGH (ref 11.5–15.5)
WBC: 4 10*3/uL (ref 4.0–10.5)

## 2015-04-06 MED ORDER — LORAZEPAM 2 MG/ML IJ SOLN
2.0000 mg | INTRAMUSCULAR | Status: DC | PRN
  Administered 2015-04-06 – 2015-04-08 (×9): 2 mg via INTRAVENOUS
  Filled 2015-04-06 (×10): qty 1

## 2015-04-06 MED ORDER — LORAZEPAM 2 MG/ML IJ SOLN
2.0000 mg | Freq: Once | INTRAMUSCULAR | Status: AC
  Administered 2015-04-06: 2 mg via INTRAVENOUS
  Filled 2015-04-06: qty 1

## 2015-04-06 NOTE — Clinical Social Work Psychosocial (Signed)
Clinical Social Work Department BRIEF PSYCHOSOCIAL ASSESSMENT 04/06/2015  Patient:  Joshua Henson, Joshua Henson     Account Number:  [id number]     Admit date:  04/02/2015  Clinical Social Worker:  Lovey Newcomer  Date/Time:  04/06/2015 03:15 PM  Referred by:  Physician  Date Referred:  04/06/2015 Referred for  SNF Placement   Other Referral:   NA   Interview type:  Patient Other interview type:   Patient alert and oriented at time of assessment.    PSYCHOSOCIAL DATA Living Status:  SIGNIFICANT OTHER Admitted from facility:   Level of care:   Primary support name:  Joshua Henson Primary support relationship to patient:  NONE Degree of support available:   Support is fair.    CURRENT CONCERNS Current Concerns  Post-Acute Placement   Other Concerns:   NA    SOCIAL WORK ASSESSMENT / PLAN CSW met with patient at bedside to complete assessment. Patient was lying flat on bed with face covered and light off in room. CSW  approached patient and asked if the patient would be willing to talk to [place]. The patient stated that he was not feeling well and did not want to get into a long discussion about anything but would speak with CSW briefly. CSW explained reason for visit (SNF placement), patient is agreeable to placement at discharge for short term rehab. CSW explained SNF search/placement process and answered questions.   Assessment/plan status:  Psychosocial Support/Ongoing Assessment of Needs Other assessment/ plan:   Complete FL2, Fax, PASRR, Complete SBIRT with patient if patient is willing.   Information/referral to community resources:   CSW contact information and SNF list given.    PATIENT'S/FAMILY'S RESPONSE TO PLAN OF CARE: Patient states that he is agreeable to SNF placement at discharge for short term rehab only. CSW will follow up with any available bed offers and assist with DC.       Liz Beach MSW, [place], [place], [phone]

## 2015-04-06 NOTE — Progress Notes (Signed)
[facility] PROGRESS NOTE  Joshua Henson FHL:[id number] DOB: Dec 26, 1945 DOA: 04/02/2015 PCP: Joshua Hare, MD  Assessment/Plan: 1. Generalized weakness/ fatigue and gait instability and some tremors: Initially admitted for evaluation of CVA, his MRI brain is negative for acute stroke. In view of his alcohol abuse, he is being treated for alcohol withdrawal and benzo withdrawal. Neurology consulted and recommendations given. Meanwhile PT eval for deconditioning, recommended SNF placement.  His vitamain and b12 levels are within normal limits. His CK level is normal. TSH within normal limits His mag and phos are low, repleted them. Repeat levels in am show much improvement.    Hypertension: well controlled.    Transaminitis Probably secondary to alcohol abuse, suspect liver cirrhosis in view of his thrombocytopenia and transaminitis.  CT abdomen and pelvis ordered for further evaluation shows liver has diffuse fatty infiltration. Hepatitis panel negative.    Blood per rectum: Resolved. GI consulted and recommendations given. No gi intervention needed at this time. Outpatient follow up.  Stopped coreg and watch his hemoglobin. Anemia panel ordered. Show ferritin of 1049 and good vitamin b12 and folate levels. Iron levels were also normal.  Repeat H&h is stable.   Hypokalemia: replete as needed.    Hypomagnesemia: Replete as needed.   Hypophosphatemia: Replace as needed.   UTI: Urine cultures negative. Rocephin discontinued.   H/o cdiff: stool for C DIFF ordered and pending.   Alcohol withdrawal on CIWA,  His agitation and tremors worsened today, and ativan 2mg  every 4 hours was added to his regimen.        Code Status: full code.  Family Communication: none at bedside.  Disposition Plan:SNF on discharge. Probably tomorrow.    Consultants:  gastroenterology  Procedures:  NONE  Antibiotics:  none  HPI/Subjective: Sleeping comfortably. Objective: Filed  Vitals:   04/06/15 1242  BP: 134/92  Pulse:   Temp: 97.6 F (36.4 C)  Resp: 20    Intake/Output Summary (Last 24 hours) at 04/06/15 1551 Last data filed at 04/06/15 1300  Gross per 24 hour  Intake 2412.5 ml  Output    701 ml  Net 1711.5 ml   Filed Weights   04/02/15 1530 04/02/15 2103  Weight: 95.255 kg (210 lb) 99.292 kg (218 lb 14.4 oz)    Exam:   General:  Alert afebrile not in any distress  Cardiovascular: s1s2  Respiratory: clear to auscultation, no wheezing or rhonchi  Abdomen: soft non tender non distended bowel sounds heard  Musculoskeletal: trace pedal edema   Data Reviewed: Basic Metabolic Panel:  Recent Labs Lab 04/02/15 1700 04/03/15 0535 04/03/15 1250 04/03/15 1936 04/04/15 0658 04/04/15 1200 04/05/15 0430 04/06/15 0728  NA 136 135  --   --  136  --  138 138  K 4.1 2.6*  --  3.0* 3.1*  --  3.2* 4.3  CL 96 96  --   --  97  --  104 108  CO2 21 25  --   --  23  --  23 18*  GLUCOSE 132* 114*  --   --  105*  --  103* 96  BUN 9 12  --   --  8  --  <5* <5*  CREATININE 1.07 1.13  --   --  0.94  --  0.93 0.81  CALCIUM 8.9 8.0*  --   --  7.8*  --  7.4* 7.5*  MG  --  1.4*  --   --   --  1.9 1.7  --  PHOS  --   --  1.4*  --   --  1.9* 3.5  --    Liver Function Tests:  Recent Labs Lab 04/02/15 1700 04/03/15 0535 04/04/15 0658 04/05/15 0430  AST 122* 94* 86* 69*  ALT 69* 54* 52 44  ALKPHOS 74 64 63 54  BILITOT 1.8* 1.7* 1.3* 1.1  PROT 6.6 5.6* 5.9* 5.3*  ALBUMIN 3.5 3.0* 3.0* 2.7*   No results for input(s): LIPASE, AMYLASE in the last 168 hours.  Recent Labs Lab 04/02/15 2248  AMMONIA 35*   CBC:  Recent Labs Lab 04/02/15 1700 04/03/15 0535  04/04/15 0658 04/04/15 1200 04/04/15 1953 04/05/15 0430 04/06/15 0728  WBC 4.9 3.5*  --  3.4*  --   --  3.3* 4.0  NEUTROABS 3.2 2.0  --  2.0  --   --  1.7 2.1  HGB 13.0 9.9*  < > 10.9* 10.9* 11.3* 10.7* 11.2*  HCT 36.4* 28.8*  < > 31.0* 31.5* 32.6* 31.8* 33.4*  MCV 99.2 101.4*  --   100.6*  --   --  103.2* 104.7*  PLT 154 96*  --  123*  --   --  135* 147*  < > = values in this interval not displayed. Cardiac Enzymes:  Recent Labs Lab 04/03/15 1250  CKTOTAL 108   BNP (last 3 results) No results for input(s): BNP in the last 8760 hours.  ProBNP (last 3 results)  Recent Labs  09/03/14 0520  PROBNP 12.6    CBG: No results for input(s): GLUCAP in the last 168 hours.  Recent Results (from the past 240 hour(s))  Urine culture     Status: None   Collection Time: 04/02/15  8:13 PM  Result Value Ref Range Status   Specimen Description URINE, CATHETERIZED  Final   Special Requests NONE  Final   Colony Count NO GROWTH Performed at [facility]   Final   Culture NO GROWTH Performed at [facility]   Final   Report Status 04/04/2015 FINAL  Final     Studies: No results found.  Scheduled Meds: . atorvastatin  10 mg Oral q1800  . chlordiazePOXIDE  25 mg Oral QHS  . folic acid  1 mg Oral Daily  . LORazepam  0-4 mg Intravenous Q12H  . multivitamin with minerals  1 tablet Oral Daily  . potassium chloride  40 mEq Oral TID  . thiamine  100 mg Oral Daily   Continuous Infusions: . sodium chloride 1,000 mL (04/06/15 0337)    Active Problems:   Alcohol abuse   Alcohol withdrawal   Tremor   Ataxia   Hypokalemia   Hypomagnesemia    Time spent: 35 minutes    Joshua Henson  [facility] Pager [phone] If 7PM-7AM, please contact night-coverage at [email or web address], password [facility] 04/06/2015, 3:51 PM  LOS: 3 days

## 2015-04-06 NOTE — Progress Notes (Signed)
Physical Therapy Treatment Patient Details Name: Joshua Henson MRN: [id number] DOB: 29-May-1946 Today's Date: 04/06/2015    History of Present Illness 69 y.o. male admitted with Generalized weakness/ fatigue and gait instability and some tremors. Found to have blood per rectum and transaminitis.    PT Comments    Pt very tired today and deferred ambulation though practiced standing balance and transfers. Pt's impulsiveness and lack of self awareness significantly limiting safety with mobility. Currently requiring min A for safe mobility as well as not having ability or desire to perform self care tasks such as cleaning up after toileting. Continue to recommend SNF. PT will continue to follow.   Follow Up Recommendations  SNF;Supervision/Assistance - 24 hour     Equipment Recommendations  None recommended by PT    Recommendations for Other Services OT consult     Precautions / Restrictions Precautions Precautions: Fall Precaution Comments:  pt very impulsive and with lack of self awareness. Restrictions Weight Bearing Restrictions: No Other Position/Activity Restrictions: very HOH, left ear better than right    Mobility  Bed Mobility Overal bed mobility: Needs Assistance Bed Mobility: Supine to Sit;Sit to Supine     Supine to sit: Supervision Sit to supine: Supervision   General bed mobility comments: pt able to get in and out of bed without physical assist but needs supervision for safety because pt moves quickly without taking note of environmental factors or safety hazards.   Transfers Overall transfer level: Needs assistance Equipment used: Rolling walker (2 wheeled);None Transfers: Sit to/from [employer] to Stand: Min assist Stand pivot transfers: Min assist       General transfer comment: min A for balance with sit to stand, performed multiple times as well as SPT to and from [facility]. cues for hand placement and safety. Used RW for second SPT  and pt much safer with this than without AD. Control of mvmt improved with trials.  Ambulation/Gait Ambulation/Gait assistance: Min assist Ambulation Distance (Feet): 3 Feet Assistive device: Rolling walker (2 wheeled)       General Gait Details: side stepped EOB 3' but pt otherwise refused ambulation due to having not slept and wanting to go back to bed   Stairs            Wheelchair Mobility    Modified Rankin (Stroke Patients Only)       Balance Overall balance assessment: Needs assistance Sitting-balance support: No upper extremity supported;Feet supported Sitting balance-Leahy Scale: Good Sitting balance - Comments: pt able to shift wt but limitations evident when pt leans out of BOS   Standing balance support: Single extremity supported;During functional activity Standing balance-Leahy Scale: Poor Standing balance comment: pt with flexed posture in standing, slight tremor noted, poor control of extremities and decreased awareness limiting standing balance.                    Cognition Arousal/Alertness: Awake/alert Behavior During Therapy: Flat affect Overall Cognitive Status: Impaired/Different from baseline Area of Impairment: Attention;Following commands;Safety/judgement;Problem solving   Current Attention Level: Sustained Memory: Decreased short-term memory Following Commands: Follows one step commands with increased time;Follows multi-step commands inconsistently;Follows multi-step commands with increased time Safety/Judgement: Decreased awareness of safety   Problem Solving: Slow processing;Difficulty sequencing;Requires verbal cues;Requires tactile cues General Comments: ability to follow commands affected partially by [facility]. Therapist instructed pt multiple times not to get up until bed alarm off and IV freed from bed rail, but pt up anyway and unaware of IV pulling.  Pt very impulsive and unsafe.    Exercises      General Comments General  comments (skin integrity, edema, etc.): Pt with decreased ability to care for self, urinated partially on floor as well as beginning to have BM in bed, pt with decreased awareness but also did not initiate cleaning himself up before trying to get back into bed.       Pertinent Vitals/Pain Pain Assessment: Faces Faces Pain Scale: Hurts even more Pain Location: buttocks Pain Descriptors / Indicators: Burning Pain Intervention(s): Monitored during session  VSS    Home Living                      Prior Function            PT Goals (current goals can now be found in the care plan section) Acute Rehab PT Goals Patient Stated Goal: get some sleep PT Goal Formulation: With patient/family Time For Goal Achievement: 04/17/15 Potential to Achieve Goals: Good Progress towards PT goals: Progressing toward goals    Frequency  Min 3X/week    PT Plan Current plan remains appropriate    Co-evaluation             End of Session   Activity Tolerance: Patient limited by fatigue Patient left: in bed;with bed alarm set;with call bell/phone within reach     Time: 1227-1244 PT Time Calculation (min) (ACUTE ONLY): 17 min  Charges:  $Therapeutic Activity: 8-22 mins                    G Codes:     Leighton Roach, PT  Acute Rehab Services  [phone]  Leighton Roach 04/06/2015, 1:13 PM

## 2015-04-06 NOTE — Care Management Note (Addendum)
    Page 1 of 1   04/08/2015     1:40:15 PM CARE MANAGEMENT NOTE 04/08/2015  Patient:  Joshua Henson, Joshua Henson   Account Number:  [id number]  Date Initiated:  04/06/2015  Documentation initiated by:  Tomi Bamberger  Subjective/Objective Assessment:   dx fall, withdrawal  admit - lives with spouse,     Action/Plan:   pt eval- rec snf   Anticipated DC Date:  04/08/2015   Anticipated DC Plan:  SKILLED NURSING FACILITY  In-house referral  Clinical Social Worker      DC Planning Services  CM consult      Choice offered to / List presented to:             Status of service:  Completed, signed off Medicare Important Message given?  YES (If response is "NO", the following Medicare IM given date fields will be blank) Date Medicare IM given:  04/06/2015 Medicare IM given by:  Tomi Bamberger Date Additional Medicare IM given:   Additional Medicare IM given by:    Discharge Disposition:  [place]  Per UR Regulation:  Reviewed for med. necessity/level of care/duration of stay  If discussed at [place] of Stay Meetings, dates discussed:   04/07/2015    Comments:  04/08/15 [place], BSN [phone] patient for dc today to snf, CSW following.  04/07/15 [place], BSN [phone] patient is on Ciwa protocol, ciwa at 10 today, conts on iv ativan q4, will plan for dc tomorrow to snf.  04/06/15 [place], BSN [phone] patient in withdrawals, came in with CIWA of 15, on ativan, NCM will cont to follow for dc needs.

## 2015-04-07 LAB — BASIC METABOLIC PANEL
Anion gap: 9 (ref 5–15)
BUN: <5 mg/dL — ABNORMAL LOW (ref 6–23)
CO2: 21 mmol/L (ref 19–32)
Calcium: 7.8 mg/dL — ABNORMAL LOW (ref 8.4–10.5)
Chloride: 109 mmol/L (ref 96–112)
Creatinine, Ser: 0.87 mg/dL (ref 0.50–1.35)
GFR calc non Af Amer: 87 mL/min — ABNORMAL LOW (ref >90)
Glucose, Bld: 98 mg/dL (ref 70–99)
POTASSIUM: 4.3 mmol/L (ref 3.5–5.1)
Sodium: 139 mmol/L (ref 135–145)

## 2015-04-07 LAB — CBC WITH DIFFERENTIAL/PLATELET
BASOS PCT: 1 % (ref 0–1)
Basophils Absolute: 0 10*3/uL (ref 0.0–0.1)
EOS PCT: 1 % (ref 0–5)
Eosinophils Absolute: 0 10*3/uL (ref 0.0–0.7)
HEMATOCRIT: 33.7 % — AB (ref 39.0–52.0)
HEMOGLOBIN: 11.4 g/dL — AB (ref 13.0–17.0)
LYMPHS ABS: 1 10*3/uL (ref 0.7–4.0)
Lymphocytes Relative: 29 % (ref 12–46)
MCH: 35.2 pg — AB (ref 26.0–34.0)
MCHC: 33.8 g/dL (ref 30.0–36.0)
MCV: 104 fL — ABNORMAL HIGH (ref 78.0–100.0)
MONOS PCT: 15 % — AB (ref 3–12)
Monocytes Absolute: 0.5 10*3/uL (ref 0.1–1.0)
NEUTROS PCT: 54 % (ref 43–77)
Neutro Abs: 1.9 10*3/uL (ref 1.7–7.7)
PLATELETS: 163 10*3/uL (ref 150–400)
RBC: 3.24 MIL/uL — AB (ref 4.22–5.81)
RDW: 18.1 % — ABNORMAL HIGH (ref 11.5–15.5)
WBC: 3.4 10*3/uL — ABNORMAL LOW (ref 4.0–10.5)

## 2015-04-07 MED ORDER — CARVEDILOL 12.5 MG PO TABS
12.5000 mg | ORAL_TABLET | Freq: Two times a day (BID) | ORAL | Status: DC
  Administered 2015-04-07 – 2015-04-08 (×3): 12.5 mg via ORAL
  Filled 2015-04-07 (×7): qty 1

## 2015-04-07 MED ORDER — LORAZEPAM 0.5 MG PO TABS
0.5000 mg | ORAL_TABLET | Freq: Once | ORAL | Status: AC
  Administered 2015-04-07: 0.5 mg via ORAL
  Filled 2015-04-07: qty 1

## 2015-04-07 MED ORDER — LORAZEPAM 1 MG PO TABS
1.0000 mg | ORAL_TABLET | Freq: Once | ORAL | Status: AC
  Administered 2015-04-07: 1 mg via ORAL
  Filled 2015-04-07: qty 1

## 2015-04-07 NOTE — Progress Notes (Signed)
Utilization review complete 

## 2015-04-07 NOTE — Progress Notes (Signed)
Pt stated that he felt like he was in a cage. Got pt up and ambulated with rolling walker to nurses desk and back with 2 stand by assist pt did say he felt some relief will continue to monitor. Arthor Captain LPN

## 2015-04-07 NOTE — Progress Notes (Signed)
New order 0.5 mg Ativan po once given pt bp 145/92 hr 77. Will continue to monitor Arthor Captain LPN

## 2015-04-07 NOTE — Progress Notes (Signed)
Pt received i.v ativan at 1754 pt was sleeping during report at [place] . He woke up at 2000 and stated "my girlfriend is coming I need the shot go ahead and give it me it relaxes me". I explained to patient he wasn't due yet and the girlfriend came and pt was calm and cooperative. He seem relaxed. I notified Md on call for p.o ativan so it would stay in patients system a little longer and he wasn't due the I.V ativan. Patient was asleep at 2200 and he woke up for med's and wanted'' the shot". I inform patient that he just got p.o ativan to help him relax as he only scored a 2 on the ciwa scale.I mentioned the fact he was relaxed and during better yet he wanted "the shot". Will cont. To monitor and if patient needs I.v ativan will give it to him.

## 2015-04-07 NOTE — Progress Notes (Signed)
[facility] PROGRESS NOTE  Joshua Henson GYI:[id number] DOB: [birth year] DOA: 04/02/2015 PCP: Adella Hare, MD Interim summary: 60 y. Male with h/o ETOH abuse, depression, anxiety, hypertension, h/o CVA comes in for tremors. As per the patient and his girlfriend his psychiatrist has taken him off benzodiazepines and his symptoms started since then. He is currently in alcohol withdrawal state controlled with ativan. Also he had a episode of bright red blood per rectum on the day of admission, which later on resolved. GI consulted and recommended outpatient follow up as his H&H has remained stable.   Assessment/Plan: 1. Generalized weakness/ fatigue and gait instability and some tremors: Initially admitted for evaluation of CVA, his MRI brain is negative for acute stroke. In view of his alcohol abuse, he is being treated for alcohol withdrawal and benzo withdrawal. Neurology consulted and recommendations given. Meanwhile PT eval for deconditioning, recommended SNF placement.  His vitamain and b12 levels are within normal limits. His CK level is normal. TSH within normal limits His mag and phos are low, repleted them. Repeat levels in am show much improvement.    Hypertension: well controlled.    Transaminitis Probably secondary to alcohol abuse, suspected liver cirrhosis in view of his thrombocytopenia and transaminitis.  CT abdomen and pelvis ordered for further evaluation shows liver has diffuse fatty infiltration, does not show any cirrhosis. Hepatitis panel negative.    Blood per rectum: Resolved. GI consulted and recommendations given. No gi intervention needed at this time. Outpatient follow up.  Stopped coreg and watch his hemoglobin. Anemia panel ordered. Show ferritin of 1049 and good vitamin b12 and folate levels. Iron levels were also normal.  Repeat H&h is stable.   Hypokalemia: replete as needed.    Hypomagnesemia: Replete as needed. Repeat level is  normal.  Hypophosphatemia: Replace as needed. Repeat level is normal  UTI: Urine cultures negative. Rocephin discontinued.   H/o cdiff: stool for C DIFF ordered and pending. He didn ot have a bowel movement so far, can d/c c diff pcr.   Alcohol withdrawal on CIWA,  His agitation and tremors worsened on 4/18, and ativan 2mg  every 4 hours was added to his regimen.  Tachycardia, will resume his coreg.        Code Status: full code.  Family Communication: none at bedside.  Disposition Plan:SNF on discharge. Probably tomorrow.    Consultants:  gastroenterology  Procedures:  NONE  Antibiotics:  none  HPI/Subjective: Wants to walk around.  Objective: Filed Vitals:   04/07/15 0446  BP: 146/86  Pulse: 92  Temp: 98.4 F (36.9 C)  Resp: 18    Intake/Output Summary (Last 24 hours) at 04/07/15 0944 Last data filed at 04/07/15 0438  Gross per 24 hour  Intake    780 ml  Output    802 ml  Net    -22 ml   Filed Weights   04/02/15 1530 04/02/15 2103  Weight: 95.255 kg (210 lb) 99.292 kg (218 lb 14.4 oz)    Exam:   General:  Alert afebrile not in any distress  Cardiovascular: s1s2 tachycardia  Respiratory: clear to auscultation, no wheezing or rhonchi  Abdomen: soft non tender non distended bowel sounds heard  Musculoskeletal: trace pedal edema   Data Reviewed: Basic Metabolic Panel:  Recent Labs Lab 04/02/15 1700 04/03/15 0535 04/03/15 1250 04/03/15 1936 04/04/15 0658 04/04/15 1200 04/05/15 0430 04/06/15 0728  NA 136 135  --   --  136  --  138 138  K 4.1 2.6*  --  3.0* 3.1*  --  3.2* 4.3  CL 96 96  --   --  97  --  104 108  CO2 21 25  --   --  23  --  23 18*  GLUCOSE 132* 114*  --   --  105*  --  103* 96  BUN 9 12  --   --  8  --  <5* <5*  CREATININE 1.07 1.13  --   --  0.94  --  0.93 0.81  CALCIUM 8.9 8.0*  --   --  7.8*  --  7.4* 7.5*  MG  --  1.4*  --   --   --  1.9 1.7  --   PHOS  --   --  1.4*  --   --  1.9* 3.5  --    Liver Function  Tests:  Recent Labs Lab 04/02/15 1700 04/03/15 0535 04/04/15 0658 04/05/15 0430  AST 122* 94* 86* 69*  ALT 69* 54* 52 44  ALKPHOS 74 64 63 54  BILITOT 1.8* 1.7* 1.3* 1.1  PROT 6.6 5.6* 5.9* 5.3*  ALBUMIN 3.5 3.0* 3.0* 2.7*   No results for input(s): LIPASE, AMYLASE in the last 168 hours.  Recent Labs Lab 04/02/15 2248  AMMONIA 35*   CBC:  Recent Labs Lab 04/03/15 0535  04/04/15 0658 04/04/15 1200 04/04/15 1953 04/05/15 0430 04/06/15 0728 04/07/15 0732  WBC 3.5*  --  3.4*  --   --  3.3* 4.0 3.4*  NEUTROABS 2.0  --  2.0  --   --  1.7 2.1 1.9  HGB 9.9*  < > 10.9* 10.9* 11.3* 10.7* 11.2* 11.4*  HCT 28.8*  < > 31.0* 31.5* 32.6* 31.8* 33.4* 33.7*  MCV 101.4*  --  100.6*  --   --  103.2* 104.7* 104.0*  PLT 96*  --  123*  --   --  135* 147* 163  < > = values in this interval not displayed. Cardiac Enzymes:  Recent Labs Lab 04/03/15 1250  CKTOTAL 108   BNP (last 3 results) No results for input(s): BNP in the last 8760 hours.  ProBNP (last 3 results)  Recent Labs  09/03/14 0520  PROBNP 12.6    CBG: No results for input(s): GLUCAP in the last 168 hours.  Recent Results (from the past 240 hour(s))  Urine culture     Status: None   Collection Time: 04/02/15  8:13 PM  Result Value Ref Range Status   Specimen Description URINE, CATHETERIZED  Final   Special Requests NONE  Final   Colony Count NO GROWTH Performed at [facility]   Final   Culture NO GROWTH Performed at [facility]   Final   Report Status 04/04/2015 FINAL  Final     Studies: No results found.  Scheduled Meds: . atorvastatin  10 mg Oral q1800  . chlordiazePOXIDE  25 mg Oral QHS  . folic acid  1 mg Oral Daily  . multivitamin with minerals  1 tablet Oral Daily  . thiamine  100 mg Oral Daily   Continuous Infusions:    Active Problems:   Alcohol abuse   Alcohol withdrawal   Tremor   Ataxia   Hypokalemia   Hypomagnesemia    Time spent: 25  minutes    Chantay Whitelock  [facility] Pager [phone] If 7PM-7AM, please contact night-coverage at [email or web address], password [facility] 04/07/2015, 9:44 AM  LOS: 4 days

## 2015-04-07 NOTE — Clinical Social Work Note (Addendum)
Bed offers given to patient's significant other. She has chosen [facility] unless [facility] is able to offer the patient a bed at discharge. CSW will assist.   UPDATE: [facility] cannot offer, patient's significant other will make decision between [facility], [facility], and [facility].   Liz Beach MSW, [place], [place], [phone]

## 2015-04-07 NOTE — Progress Notes (Signed)
[facility] notified that that pt states he feels like he is coming out of his skin 2mg  Ativan IV at 2410 for Ciwa of 11 awaiting new orders. Arthor Captain LPN

## 2015-04-08 DIAGNOSIS — E1100 Type 2 diabetes mellitus with hyperosmolarity without nonketotic hyperglycemic-hyperosmolar coma (NKHHC): Secondary | ICD-10-CM | POA: Diagnosis not present

## 2015-04-08 DIAGNOSIS — M6281 Muscle weakness (generalized): Secondary | ICD-10-CM | POA: Diagnosis not present

## 2015-04-08 DIAGNOSIS — Z9181 History of falling: Secondary | ICD-10-CM | POA: Diagnosis not present

## 2015-04-08 DIAGNOSIS — K709 Alcoholic liver disease, unspecified: Secondary | ICD-10-CM | POA: Diagnosis not present

## 2015-04-08 DIAGNOSIS — I10 Essential (primary) hypertension: Secondary | ICD-10-CM | POA: Diagnosis not present

## 2015-04-08 DIAGNOSIS — R2689 Other abnormalities of gait and mobility: Secondary | ICD-10-CM | POA: Diagnosis not present

## 2015-04-08 DIAGNOSIS — F419 Anxiety disorder, unspecified: Secondary | ICD-10-CM | POA: Diagnosis not present

## 2015-04-08 DIAGNOSIS — E876 Hypokalemia: Secondary | ICD-10-CM | POA: Diagnosis not present

## 2015-04-08 DIAGNOSIS — G4700 Insomnia, unspecified: Secondary | ICD-10-CM | POA: Diagnosis not present

## 2015-04-08 DIAGNOSIS — M62838 Other muscle spasm: Secondary | ICD-10-CM | POA: Diagnosis not present

## 2015-04-08 DIAGNOSIS — E785 Hyperlipidemia, unspecified: Secondary | ICD-10-CM | POA: Diagnosis not present

## 2015-04-08 DIAGNOSIS — E119 Type 2 diabetes mellitus without complications: Secondary | ICD-10-CM | POA: Diagnosis not present

## 2015-04-08 DIAGNOSIS — F10239 Alcohol dependence with withdrawal, unspecified: Secondary | ICD-10-CM | POA: Diagnosis not present

## 2015-04-08 DIAGNOSIS — H9193 Unspecified hearing loss, bilateral: Secondary | ICD-10-CM | POA: Diagnosis not present

## 2015-04-08 DIAGNOSIS — K760 Fatty (change of) liver, not elsewhere classified: Secondary | ICD-10-CM | POA: Diagnosis not present

## 2015-04-08 DIAGNOSIS — R270 Ataxia, unspecified: Secondary | ICD-10-CM | POA: Diagnosis not present

## 2015-04-08 DIAGNOSIS — R740 Nonspecific elevation of levels of transaminase and lactic acid dehydrogenase [LDH]: Secondary | ICD-10-CM | POA: Diagnosis not present

## 2015-04-08 DIAGNOSIS — K922 Gastrointestinal hemorrhage, unspecified: Secondary | ICD-10-CM | POA: Diagnosis not present

## 2015-04-08 LAB — CBC WITH DIFFERENTIAL/PLATELET
Basophils Absolute: 0.1 10*3/uL (ref 0.0–0.1)
Basophils Relative: 1 % (ref 0–1)
Eosinophils Absolute: 0.1 10*3/uL (ref 0.0–0.7)
Eosinophils Relative: 1 % (ref 0–5)
HCT: 34.2 % — ABNORMAL LOW (ref 39.0–52.0)
Hemoglobin: 11.6 g/dL — ABNORMAL LOW (ref 13.0–17.0)
LYMPHS PCT: 29 % (ref 12–46)
Lymphs Abs: 1.1 10*3/uL (ref 0.7–4.0)
MCH: 35.7 pg — ABNORMAL HIGH (ref 26.0–34.0)
MCHC: 33.9 g/dL (ref 30.0–36.0)
MCV: 105.2 fL — ABNORMAL HIGH (ref 78.0–100.0)
MONOS PCT: 20 % — AB (ref 3–12)
Monocytes Absolute: 0.7 10*3/uL (ref 0.1–1.0)
NEUTROS ABS: 1.8 10*3/uL (ref 1.7–7.7)
NEUTROS PCT: 49 % (ref 43–77)
PLATELETS: 165 10*3/uL (ref 150–400)
RBC: 3.25 MIL/uL — AB (ref 4.22–5.81)
RDW: 18.2 % — ABNORMAL HIGH (ref 11.5–15.5)
WBC: 3.7 10*3/uL — AB (ref 4.0–10.5)

## 2015-04-08 LAB — BASIC METABOLIC PANEL
Anion gap: 11 (ref 5–15)
BUN: 7 mg/dL (ref 6–23)
CO2: 20 mmol/L (ref 19–32)
Calcium: 8.2 mg/dL — ABNORMAL LOW (ref 8.4–10.5)
Chloride: 108 mmol/L (ref 96–112)
Creatinine, Ser: 1.05 mg/dL (ref 0.50–1.35)
GFR, EST AFRICAN AMERICAN: 83 mL/min — AB (ref >90)
GFR, EST NON AFRICAN AMERICAN: 71 mL/min — AB (ref >90)
Glucose, Bld: 97 mg/dL (ref 70–99)
Potassium: 3.6 mmol/L (ref 3.5–5.1)
Sodium: 139 mmol/L (ref 135–145)

## 2015-04-08 MED ORDER — CLONAZEPAM 0.5 MG PO TABS
0.5000 mg | ORAL_TABLET | Freq: Three times a day (TID) | ORAL | Status: DC | PRN
  Administered 2015-04-08: 0.5 mg via ORAL
  Filled 2015-04-08: qty 1

## 2015-04-08 MED ORDER — CHLORDIAZEPOXIDE HCL 10 MG PO CAPS: 10.0000 mg | ORAL_CAPSULE | Freq: Every day | ORAL | Status: DC

## 2015-04-08 MED ORDER — LORAZEPAM 1 MG PO TABS: 1.0000 mg | ORAL_TABLET | Freq: Three times a day (TID) | ORAL | Status: DC

## 2015-04-08 MED ORDER — CHLORDIAZEPOXIDE HCL 5 MG PO CAPS
10.0000 mg | ORAL_CAPSULE | Freq: Three times a day (TID) | ORAL | Status: DC
  Administered 2015-04-08 (×2): 10 mg via ORAL
  Filled 2015-04-08 (×2): qty 2

## 2015-04-08 NOTE — Progress Notes (Signed)
Pt prepared for d/c to SNF. IV d/c'd. Skin intact except as most recently charted. Vitals are stable. Report called to receiving facility. Pt transported by ambulance service.

## 2015-04-08 NOTE — Discharge Summary (Signed)
Joshua Henson, is a 69 y.o. male  DOB [birth year]  MRN [id number].  Admission date:  04/02/2015  Admitting Physician  Deneise Lever, MD  Discharge Date:  04/08/2015   Primary MD  Adella Hare, MD  Recommendations for primary care physician for things to follow:   CBC, CMP, magnesium and phosphorous levels in a week. Must follow with requested subspecialists in 1-2 weeks   Admission Diagnosis  Ataxia [R27.0] Tremor [R25.1] Fatigue [R53.83] Falls [W19.XXXA]   Discharge Diagnosis  Ataxia [R27.0] Tremor [R25.1] Fatigue [R53.83] Falls [W19.XXXA]    Active Problems:   Alcohol abuse   Alcohol withdrawal   Tremor   Ataxia   Hypokalemia   Hypomagnesemia      Past Medical History  Diagnosis Date  . Prostate cancer 2010    External beam radiation (urol - Risa Grill, XRT Valere Dross)  . Stroke   . Depression   . Tachycardia   . Chronic kidney disease   . Hypertension   . Insomnia     Past Surgical History  Procedure Laterality Date  . None         History of present illness and  Hospital Course:     Kindly see H&P for history of present illness and admission details, please review complete Labs, Consult reports and Test reports for all details in brief  HPI  39 y. Male with h/o ETOH abuse, depression, anxiety, hypertension, h/o CVA comes in for tremors. As per the patient and his girlfriend his psychiatrist has taken him off benzodiazepines and his symptoms started since then. He is currently in alcohol withdrawal state controlled with ativan. Also he had a episode of bright red blood per rectum on the day of admission, which later on resolved. GI consulted and recommended outpatient follow up as his H&H has remained stable.     Hospital Course    1. Alcohol and benzodiazepine withdrawal. Much  improved, no signs of DTs, placed on Librium along with long-acting Ativan. Will request rehabilitation M.D. to gradually taper off Ativan over the next week. Thereafter taper of Librium in the outpatient setting. He has been counseled to quit drinking alcohol. Continue folic acid and thiamine.  2. Generalized weakness and deconditioning. Continue PT OT at SNF.   3. Transaminitis with fatty liver/alcoholic hepatitis. Stable. Seen by GI recommended outpatient follow-up, recommended to quit alcohol. Follow with GI in the outpatient setting as outlined below. Cautiously resume statin monitor liver enzymes closely. If liver enzymes go up more than 2 times normal stop statin permanently.   4. Essential hypertension. Stable continue home regimen.   5. Blood per rectum. Seen by GI. H&H stable. Follow with GI outpatient.   6. Hypokalemia, hypomagnesemia and hypophosphatemia. I'll replaced and stable. Request SNF M.D. to repeat electrolytes in a week.   7. History of C. difficile colitis. No diarrhea here.   8. Severe bilateral hearing loss. Outpatient follow-up with audiologist.   9. Dyslipidemia. On low-dose statin. Repeat CMP in 1 week and then again in  2 weeks. If develops transaminitis with elevation of liver enzymes more than 2 times normal stop statin.    Discharge Condition: Stable   Follow UP  Follow-up Information    Follow up with Adella Hare, MD. Schedule an appointment as soon as possible for a visit in 1 week.   Specialty:  Internal Medicine   Why:  or his partner      Follow up with [facility], MD. Schedule an appointment as soon as possible for a visit in 1 week.   Specialty:  Gastroenterology   Why:  Fatty liver, GI bleed   Contact information:   [address] [place] [address] [phone]         Discharge Instructions  and  Discharge Medications         Discharge Instructions    Diet - low sodium heart healthy    Complete by:   As directed      Discharge instructions    Complete by:  As directed   Follow with Primary MD Adella Hare, MD in 7 days   Get CBC, CMP, 2 view Chest X ray checked  by Primary MD next visit.    Activity: As tolerated with Full fall precautions use walker/cane & assistance as needed   Disposition SNF   Diet: Heart Healthy    For Heart failure patients - Check your Weight same time everyday, if you gain over 2 pounds, or you develop in leg swelling, experience more shortness of breath or chest pain, call your Primary MD immediately. Follow Cardiac Low Salt Diet and 1.5 lit/day fluid restriction.   On your next visit with your primary care physician please Get Medicines reviewed and adjusted.   Please request your Prim.MD to go over all Hospital Tests and Procedure/Radiological results at the follow up, please get all Hospital records sent to your Prim MD by signing hospital release before you go home.   If you experience worsening of your admission symptoms, develop shortness of breath, life threatening emergency, suicidal or homicidal thoughts you must seek medical attention immediately by calling 911 or calling your MD immediately  if symptoms less severe.  You Must read complete instructions/literature along with all the possible adverse reactions/side effects for all the Medicines you take and that have been prescribed to you. Take any new Medicines after you have completely understood and accpet all the possible adverse reactions/side effects.   Do not drive, operating heavy machinery, perform activities at heights, swimming or participation in water activities or provide baby sitting services if your were admitted for syncope or siezures until you have seen by Primary MD or a Neurologist and advised to do so again.  Do not drive when taking Pain medications.    Do not take more than prescribed Pain, Sleep and Anxiety Medications  Special Instructions: If you have smoked or  chewed Tobacco  in the last 2 yrs please stop smoking, stop any regular Alcohol  and or any Recreational drug use.  Wear Seat belts while driving.   Please note  You were cared for by a hospitalist during your hospital stay. If you have any questions about your discharge medications or the care you received while you were in the hospital after you are discharged, you can call the unit and asked to speak with the hospitalist on call if the hospitalist that took care of you is not available. Once you are discharged, your primary care physician will handle any further  medical issues. Please note that NO REFILLS for any discharge medications will be authorized once you are discharged, as it is imperative that you return to your primary care physician (or establish a relationship with a primary care physician if you do not have one) for your aftercare needs so that they can reassess your need for medications and monitor your lab values.     Increase activity slowly    Complete by:  As directed             Medication List    TAKE these medications        aspirin 81 MG EC tablet  Take 1 tablet (81 mg total) by mouth daily.     atorvastatin 10 MG tablet  Commonly known as:  LIPITOR  Take 1 tablet (10 mg total) by mouth daily.     carvedilol 12.5 MG tablet  Commonly known as:  COREG  Take 12.5 mg by mouth 2 (two) times daily.     chlordiazePOXIDE 10 MG capsule  Commonly known as:  LIBRIUM  Take 1 capsule (10 mg total) by mouth at bedtime.     CIALIS 20 MG tablet  Generic drug:  tadalafil  Take 20 mg by mouth as needed for erectile dysfunction.     folic acid 1 MG tablet  Commonly known as:  FOLVITE  Take 1 tablet (1 mg total) by mouth daily.     LORazepam 1 MG tablet  Commonly known as:  ATIVAN  Take 1 tablet (1 mg total) by mouth 3 (three) times daily.     potassium chloride SA 20 MEQ tablet  Commonly known as:  K-DUR,KLOR-CON  Take 1 tablet (20 mEq total) by mouth daily.       tamsulosin 0.4 MG Caps capsule  Commonly known as:  FLOMAX  Take 0.4 mg by mouth daily.     triamterene-hydrochlorothiazide 37.5-25 MG per capsule  Commonly known as:  DYAZIDE  Take 1 capsule by mouth daily.          Diet and Activity recommendation: See Discharge Instructions above   Consults obtained - None   Major procedures and Radiology Reports - PLEASE review detailed and final reports for all details, in brief -       Dg Chest 2 View  04/02/2015   CLINICAL DATA:  Multiple falls over the last 3 days with increasing malaise and loss of bladder control. Initial encounter.  EXAM: CHEST  2 VIEW  COMPARISON:  09/09/2014 and 09/07/2014.  FINDINGS: Lower lung volumes with asymmetric elevation of the right hemidiaphragm and mild associated right basilar atelectasis. No confluent airspace opacity, edema or pleural effusion. The heart size is stable. There is no evidence of acute fracture.  IMPRESSION: Low lung volumes with mild right basilar atelectasis. No evidence of consolidation or acute osseous injury.   Electronically Signed   By: Richardean Sale M.D.   On: 04/02/2015 16:58   Ct Head Wo Contrast  04/02/2015   CLINICAL DATA:  Fatigue, recent falls.  History of CVA.  EXAM: CT HEAD WITHOUT CONTRAST  TECHNIQUE: Contiguous axial images were obtained from the base of the skull through the vertex without intravenous contrast.  COMPARISON:  12/09/2014  FINDINGS: No intracranial hemorrhage, mass effect, or midline shift. No hydrocephalus. The basilar cisterns are patent. Mild atrophy and chronic small vessel ischemic change, stable from prior. No evidence of territorial infarct. No intracranial fluid collection. Calvarium is intact. Mucosal thickening of the maxillary sinuses with question of  left maxillary sinus made retention cyst, unchanged. Mastoid air cells are well aerated.  IMPRESSION: No acute intracranial abnormality. Stable atrophy and chronic small vessel ischemic change.    Electronically Signed   By: Jeb Levering M.D.   On: 04/02/2015 17:37   Mr Brain Wo Contrast  04/03/2015   CLINICAL DATA:  Initial evaluation for acute tremulousness and unsteady gait.  EXAM: MRI HEAD WITHOUT CONTRAST  TECHNIQUE: Multiplanar, multiecho pulse sequences of the brain and surrounding structures were obtained without intravenous contrast.  COMPARISON:  Prior CT from 04/02/2015  FINDINGS: Diffuse prominence of the CSF containing spaces is compatible with generalized cerebral atrophy. Mild chronic small vessel ischemic changes present within the periventricular white matter.  No abnormal foci of restricted diffusion to suggest acute intracranial infarct. Gray-white matter differentiation maintained. No acute or chronic intracranial hemorrhage. Normal intravascular flow voids preserved.  No mass lesion or midline shift. No hydrocephalus. No extra-axial fluid collection.  Craniocervical junction within normal limits. Incidental note made of a partially empty sella. No acute abnormality about the orbits. Mucosal thickening present within the right maxillary sinus, likely chronic in nature. Retention cyst present within the left maxillary sinus. Paranasal sinuses are otherwise clear. Scant fluid signal intensity present within the right mastoid air cells.  Bone marrow signal intensity normal. No scalp soft tissue abnormality.  IMPRESSION: 1. No acute intracranial infarct or other abnormality identified. 2. Generalized cerebral atrophy with mild chronic small vessel ischemic disease.   Electronically Signed   By: Jeannine Boga M.D.   On: 04/03/2015 02:28   Ct Abdomen Pelvis W Contrast  04/03/2015   CLINICAL DATA:  Elevated liver function studies. Fatigue. Rectal bleeding. Anemia. ETOH abuse.  EXAM: CT ABDOMEN AND PELVIS WITH CONTRAST  TECHNIQUE: Multidetector CT imaging of the abdomen and pelvis was performed using the standard protocol following bolus administration of intravenous contrast.   CONTRAST:  175mL OMNIPAQUE IOHEXOL 300 MG/ML  SOLN  COMPARISON:  11/16/2011  FINDINGS: Lower chest: The lung bases are clear except for dependent atelectasis. No pleural effusions or worrisome pulmonary lesions. The heart is normal in size. Moderate epicardial and pericardial fat. Three-vessel coronary artery calcifications are noted. The distal esophagus is grossly normal.  Hepatobiliary: Severe and diffuse fatty infiltration of the liver but no focal hepatic lesions are identified. The liver contour is slightly irregular but I do not see any definite other findings for cirrhosis. The portal and hepatic veins are patent. No intrahepatic biliary dilatation. There appear to be chronic changes of adenomyomatosis involving the gallbladder. No common bile duct dilatation.  Pancreas: Normal.  Spleen: Scattered small calcified granulomas. No focal lesions or splenomegaly.  Adrenals/Urinary Tract: The adrenal glands are unremarkable. No renal lesions or hydronephrosis.  Stomach/Bowel: These stomach, duodenum, small bowel and colon are unremarkable. No inflammatory changes, mass lesions or obstructive findings. The terminal ileum is normal. The appendix is normal. Colonic diverticulosis without findings for acute diverticulitis.  Vascular/Lymphatic: No mesenteric or retroperitoneal mass or adenopathy. The aorta and branch vessels are patent. The major venous structures are patent.  Other: The bladder, prostate gland and seminal vesicles are unremarkable. No inguinal mass or adenopathy.  Musculoskeletal: The bony structures are unremarkable.  IMPRESSION: 1. Severe and diffuse fatty infiltration of the liver but no focal hepatic lesions. The mildly irregular liver contour but no other findings to suggest cirrhosis. 2. Suspect chronic changes of adenomyomatosis involving the gallbladder. 3. No acute abdominal/pelvic findings, mass lesions or lymphadenopathy.   Electronically Signed   By:  Marijo Sanes M.D.   On: 04/03/2015  20:45    Micro Results      Recent Results (from the past 240 hour(s))  Urine culture     Status: None   Collection Time: 04/02/15  8:13 PM  Result Value Ref Range Status   Specimen Description URINE, CATHETERIZED  Final   Special Requests NONE  Final   Colony Count NO GROWTH Performed at [facility]   Final   Culture NO GROWTH Performed at [facility]   Final   Report Status 04/04/2015 FINAL  Final       Today   Subjective:   [name] today has no headache,no chest abdominal pain,no new weakness tingling or numbness, feels much better.  Objective:   Blood pressure 138/77, pulse 78, temperature 97.4 F (36.3 C), temperature source Oral, resp. rate 20, height 5\' 10"  (1.778 m), weight 99.292 kg (218 lb 14.4 oz), SpO2 97 %.   Intake/Output Summary (Last 24 hours) at 04/08/15 1036 Last data filed at 04/08/15 0858  Gross per 24 hour  Intake    600 ml  Output    580 ml  Net     20 ml    Exam Awake Alert, Oriented x 3, No new F.N deficits, Normal affect [place].AT,PERRAL Supple Neck,No JVD, No cervical lymphadenopathy appriciated.  Symmetrical Chest wall movement, Good air movement bilaterally, CTAB RRR,No Gallops,Rubs or new Murmurs, No Parasternal Heave +ve B.Sounds, Abd Soft, Non tender, No organomegaly appriciated, No rebound -guarding or rigidity. No Cyanosis, Clubbing or edema, No new Rash or bruise  Data Review   CBC w Diff:  Lab Results  Component Value Date   WBC 3.4* 04/07/2015   HGB 11.4* 04/07/2015   HCT 33.7* 04/07/2015   PLT 163 04/07/2015   LYMPHOPCT 29 04/07/2015   MONOPCT 15* 04/07/2015   EOSPCT 1 04/07/2015   BASOPCT 1 04/07/2015    CMP:  Lab Results  Component Value Date   NA 139 04/08/2015   K 3.6 04/08/2015   CL 108 04/08/2015   CO2 20 04/08/2015   BUN 7 04/08/2015   CREATININE 1.05 04/08/2015   CREATININE 0.84 01/03/2014   PROT 5.3* 04/05/2015   ALBUMIN 2.7* 04/05/2015   BILITOT 1.1 04/05/2015   ALKPHOS 54  04/05/2015   AST 69* 04/05/2015   ALT 44 04/05/2015  .   Total Time in preparing paper work, data evaluation and todays exam - 35 minutes  Thurnell Lose M.D on 04/08/2015 at 10:36 AM  [facility]   Office  [phone]

## 2015-04-08 NOTE — Clinical Social Work Placement (Signed)
   CLINICAL SOCIAL WORK PLACEMENT  NOTE  Date:  04/08/2015  Patient Details  Name: Joshua Henson MRN: [id number] Date of Birth: 07-04-1946  Clinical Social Work is seeking post-discharge placement for this patient at the [place] level of care (*CSW will initial, date and re-position this form in  chart as items are completed):  Yes   Patient/family provided with [facility] Work Department's list of facilities offering this level of care within the geographic area requested by the patient (or if unable, by the patient's family).  Yes   Patient/family informed of their freedom to choose among providers that offer the needed level of care, that participate in Medicare, Medicaid or managed care program needed by the patient, have an available bed and are willing to accept the patient.  Yes   Patient/family informed of [facility]'s ownership interest in [facility] and [facility], as well as of the fact that they are under no obligation to receive care at these facilities.  PASRR submitted to EDS on 04/07/15     PASRR number received on 04/07/15     Existing PASRR number confirmed on       FL2 transmitted to all facilities in geographic area requested by pt/family on 04/07/15     FL2 transmitted to all facilities within larger geographic area on       Patient informed that his/her managed care company has contracts with or will negotiate with certain facilities, including the following:        Yes   Patient/family informed of bed offers received.  Patient chooses bed at [facility]     Physician recommends and patient chooses bed at      Patient to be transferred to [facility] on 04/08/15.  Patient to be transferred to facility by Ambulance - [facility]     Patient family notified on 04/08/15 of transfer.  Name of family member notified:  [name] - patient significant other over the phone     PHYSICIAN Please sign FL2     Additional  Comment:    _______________________________________________ Barbette Or, [place]

## 2015-04-08 NOTE — Discharge Instructions (Signed)
Follow with Primary MD Joshua Hare, MD in 7 days   Get CBC, CMP, 2 view Chest X ray checked  by Primary MD next visit.    Activity: As tolerated with Full fall precautions use walker/cane & assistance as needed   Disposition SNF   Diet: Heart Healthy    For Heart failure patients - Check your Weight same time everyday, if you gain over 2 pounds, or you develop in leg swelling, experience more shortness of breath or chest pain, call your Primary MD immediately. Follow Cardiac Low Salt Diet and 1.5 lit/day fluid restriction.   On your next visit with your primary care physician please Get Medicines reviewed and adjusted.   Please request your Prim.MD to go over all Hospital Tests and Procedure/Radiological results at the follow up, please get all Hospital records sent to your Prim MD by signing hospital release before you go home.   If you experience worsening of your admission symptoms, develop shortness of breath, life threatening emergency, suicidal or homicidal thoughts you must seek medical attention immediately by calling 911 or calling your MD immediately  if symptoms less severe.  You Must read complete instructions/literature along with all the possible adverse reactions/side effects for all the Medicines you take and that have been prescribed to you. Take any new Medicines after you have completely understood and accpet all the possible adverse reactions/side effects.   Do not drive, operating heavy machinery, perform activities at heights, swimming or participation in water activities or provide baby sitting services if your were admitted for syncope or siezures until you have seen by Primary MD or a Neurologist and advised to do so again.  Do not drive when taking Pain medications.    Do not take more than prescribed Pain, Sleep and Anxiety Medications  Special Instructions: If you have smoked or chewed Tobacco  in the last 2 yrs please stop smoking, stop any regular  Alcohol  and or any Recreational drug use.  Wear Seat belts while driving.   Please note  You were cared for by a hospitalist during your hospital stay. If you have any questions about your discharge medications or the care you received while you were in the hospital after you are discharged, you can call the unit and asked to speak with the hospitalist on call if the hospitalist that took care of you is not available. Once you are discharged, your primary care physician will handle any further medical issues. Please note that NO REFILLS for any discharge medications will be authorized once you are discharged, as it is imperative that you return to your primary care physician (or establish a relationship with a primary care physician if you do not have one) for your aftercare needs so that they can reassess your need for medications and monitor your lab values.

## 2015-04-08 NOTE — Clinical Social Work Note (Signed)
Clinical Social Worker facilitated patient discharge including contacting patient family and facility to confirm patient discharge plans.  Clinical information faxed to facility and family agreeable with plan.  CSW arranged ambulance transport via [facility] to [facility].  RN to call report prior to discharge.  Clinical Social Worker will sign off for now as social work intervention is no longer needed. Please consult us again if new need arises.  Jesse Marteze Vecchio, LCSW [phone] 

## 2015-04-09 DIAGNOSIS — M6281 Muscle weakness (generalized): Secondary | ICD-10-CM | POA: Diagnosis not present

## 2015-04-12 DIAGNOSIS — G4700 Insomnia, unspecified: Secondary | ICD-10-CM | POA: Diagnosis not present

## 2015-04-12 DIAGNOSIS — M6281 Muscle weakness (generalized): Secondary | ICD-10-CM | POA: Diagnosis not present

## 2015-04-13 DIAGNOSIS — M62838 Other muscle spasm: Secondary | ICD-10-CM | POA: Diagnosis not present

## 2015-04-13 DIAGNOSIS — G4700 Insomnia, unspecified: Secondary | ICD-10-CM | POA: Diagnosis not present

## 2015-04-17 DIAGNOSIS — M6281 Muscle weakness (generalized): Secondary | ICD-10-CM | POA: Diagnosis not present

## 2015-04-17 DIAGNOSIS — G4700 Insomnia, unspecified: Secondary | ICD-10-CM | POA: Diagnosis not present

## 2015-04-17 DIAGNOSIS — F419 Anxiety disorder, unspecified: Secondary | ICD-10-CM | POA: Diagnosis not present

## 2015-04-20 DIAGNOSIS — M6281 Muscle weakness (generalized): Secondary | ICD-10-CM | POA: Diagnosis not present

## 2015-04-20 DIAGNOSIS — G4700 Insomnia, unspecified: Secondary | ICD-10-CM | POA: Diagnosis not present

## 2015-04-20 DIAGNOSIS — F419 Anxiety disorder, unspecified: Secondary | ICD-10-CM | POA: Diagnosis not present

## 2015-04-22 DIAGNOSIS — F419 Anxiety disorder, unspecified: Secondary | ICD-10-CM | POA: Diagnosis not present

## 2015-04-22 DIAGNOSIS — G4700 Insomnia, unspecified: Secondary | ICD-10-CM | POA: Diagnosis not present

## 2015-04-22 DIAGNOSIS — M6281 Muscle weakness (generalized): Secondary | ICD-10-CM | POA: Diagnosis not present

## 2015-04-29 DIAGNOSIS — R2689 Other abnormalities of gait and mobility: Secondary | ICD-10-CM | POA: Diagnosis not present

## 2015-04-29 DIAGNOSIS — N189 Chronic kidney disease, unspecified: Secondary | ICD-10-CM | POA: Diagnosis not present

## 2015-04-29 DIAGNOSIS — M6281 Muscle weakness (generalized): Secondary | ICD-10-CM | POA: Diagnosis not present

## 2015-04-29 DIAGNOSIS — I129 Hypertensive chronic kidney disease with stage 1 through stage 4 chronic kidney disease, or unspecified chronic kidney disease: Secondary | ICD-10-CM | POA: Diagnosis not present

## 2015-04-29 DIAGNOSIS — K760 Fatty (change of) liver, not elsewhere classified: Secondary | ICD-10-CM | POA: Diagnosis not present

## 2015-04-29 DIAGNOSIS — K7010 Alcoholic hepatitis without ascites: Secondary | ICD-10-CM | POA: Diagnosis not present

## 2015-04-30 DIAGNOSIS — I129 Hypertensive chronic kidney disease with stage 1 through stage 4 chronic kidney disease, or unspecified chronic kidney disease: Secondary | ICD-10-CM | POA: Diagnosis not present

## 2015-04-30 DIAGNOSIS — K760 Fatty (change of) liver, not elsewhere classified: Secondary | ICD-10-CM | POA: Diagnosis not present

## 2015-04-30 DIAGNOSIS — R2689 Other abnormalities of gait and mobility: Secondary | ICD-10-CM | POA: Diagnosis not present

## 2015-04-30 DIAGNOSIS — K7010 Alcoholic hepatitis without ascites: Secondary | ICD-10-CM | POA: Diagnosis not present

## 2015-04-30 DIAGNOSIS — N189 Chronic kidney disease, unspecified: Secondary | ICD-10-CM | POA: Diagnosis not present

## 2015-04-30 DIAGNOSIS — M6281 Muscle weakness (generalized): Secondary | ICD-10-CM | POA: Diagnosis not present

## 2015-05-01 DIAGNOSIS — M6281 Muscle weakness (generalized): Secondary | ICD-10-CM | POA: Diagnosis not present

## 2015-05-01 DIAGNOSIS — I129 Hypertensive chronic kidney disease with stage 1 through stage 4 chronic kidney disease, or unspecified chronic kidney disease: Secondary | ICD-10-CM | POA: Diagnosis not present

## 2015-05-01 DIAGNOSIS — K760 Fatty (change of) liver, not elsewhere classified: Secondary | ICD-10-CM | POA: Diagnosis not present

## 2015-05-01 DIAGNOSIS — R2689 Other abnormalities of gait and mobility: Secondary | ICD-10-CM | POA: Diagnosis not present

## 2015-05-01 DIAGNOSIS — K7010 Alcoholic hepatitis without ascites: Secondary | ICD-10-CM | POA: Diagnosis not present

## 2015-05-01 DIAGNOSIS — N189 Chronic kidney disease, unspecified: Secondary | ICD-10-CM | POA: Diagnosis not present

## 2015-05-04 DIAGNOSIS — N189 Chronic kidney disease, unspecified: Secondary | ICD-10-CM | POA: Diagnosis not present

## 2015-05-04 DIAGNOSIS — R2689 Other abnormalities of gait and mobility: Secondary | ICD-10-CM | POA: Diagnosis not present

## 2015-05-04 DIAGNOSIS — K760 Fatty (change of) liver, not elsewhere classified: Secondary | ICD-10-CM | POA: Diagnosis not present

## 2015-05-04 DIAGNOSIS — I129 Hypertensive chronic kidney disease with stage 1 through stage 4 chronic kidney disease, or unspecified chronic kidney disease: Secondary | ICD-10-CM | POA: Diagnosis not present

## 2015-05-04 DIAGNOSIS — M6281 Muscle weakness (generalized): Secondary | ICD-10-CM | POA: Diagnosis not present

## 2015-05-04 DIAGNOSIS — K7010 Alcoholic hepatitis without ascites: Secondary | ICD-10-CM | POA: Diagnosis not present

## 2015-05-06 DIAGNOSIS — K7010 Alcoholic hepatitis without ascites: Secondary | ICD-10-CM | POA: Diagnosis not present

## 2015-05-06 DIAGNOSIS — M6281 Muscle weakness (generalized): Secondary | ICD-10-CM | POA: Diagnosis not present

## 2015-05-06 DIAGNOSIS — I129 Hypertensive chronic kidney disease with stage 1 through stage 4 chronic kidney disease, or unspecified chronic kidney disease: Secondary | ICD-10-CM | POA: Diagnosis not present

## 2015-05-06 DIAGNOSIS — R2689 Other abnormalities of gait and mobility: Secondary | ICD-10-CM | POA: Diagnosis not present

## 2015-05-06 DIAGNOSIS — N189 Chronic kidney disease, unspecified: Secondary | ICD-10-CM | POA: Diagnosis not present

## 2015-05-06 DIAGNOSIS — K760 Fatty (change of) liver, not elsewhere classified: Secondary | ICD-10-CM | POA: Diagnosis not present

## 2015-05-08 DIAGNOSIS — R2689 Other abnormalities of gait and mobility: Secondary | ICD-10-CM | POA: Diagnosis not present

## 2015-05-08 DIAGNOSIS — K7010 Alcoholic hepatitis without ascites: Secondary | ICD-10-CM | POA: Diagnosis not present

## 2015-05-08 DIAGNOSIS — M6281 Muscle weakness (generalized): Secondary | ICD-10-CM | POA: Diagnosis not present

## 2015-05-08 DIAGNOSIS — N189 Chronic kidney disease, unspecified: Secondary | ICD-10-CM | POA: Diagnosis not present

## 2015-05-08 DIAGNOSIS — I129 Hypertensive chronic kidney disease with stage 1 through stage 4 chronic kidney disease, or unspecified chronic kidney disease: Secondary | ICD-10-CM | POA: Diagnosis not present

## 2015-05-08 DIAGNOSIS — K760 Fatty (change of) liver, not elsewhere classified: Secondary | ICD-10-CM | POA: Diagnosis not present

## 2015-05-10 DIAGNOSIS — K760 Fatty (change of) liver, not elsewhere classified: Secondary | ICD-10-CM | POA: Diagnosis not present

## 2015-05-10 DIAGNOSIS — I129 Hypertensive chronic kidney disease with stage 1 through stage 4 chronic kidney disease, or unspecified chronic kidney disease: Secondary | ICD-10-CM | POA: Diagnosis not present

## 2015-05-10 DIAGNOSIS — M6281 Muscle weakness (generalized): Secondary | ICD-10-CM | POA: Diagnosis not present

## 2015-05-10 DIAGNOSIS — M67833 Other specified disorders of tendon, right wrist: Secondary | ICD-10-CM | POA: Diagnosis not present

## 2015-05-10 DIAGNOSIS — N189 Chronic kidney disease, unspecified: Secondary | ICD-10-CM | POA: Diagnosis not present

## 2015-05-10 DIAGNOSIS — R2689 Other abnormalities of gait and mobility: Secondary | ICD-10-CM | POA: Diagnosis not present

## 2015-05-10 DIAGNOSIS — K7010 Alcoholic hepatitis without ascites: Secondary | ICD-10-CM | POA: Diagnosis not present

## 2015-05-11 DIAGNOSIS — I129 Hypertensive chronic kidney disease with stage 1 through stage 4 chronic kidney disease, or unspecified chronic kidney disease: Secondary | ICD-10-CM | POA: Diagnosis not present

## 2015-05-11 DIAGNOSIS — N189 Chronic kidney disease, unspecified: Secondary | ICD-10-CM | POA: Diagnosis not present

## 2015-05-11 DIAGNOSIS — R2689 Other abnormalities of gait and mobility: Secondary | ICD-10-CM | POA: Diagnosis not present

## 2015-05-11 DIAGNOSIS — K7010 Alcoholic hepatitis without ascites: Secondary | ICD-10-CM | POA: Diagnosis not present

## 2015-05-11 DIAGNOSIS — K760 Fatty (change of) liver, not elsewhere classified: Secondary | ICD-10-CM | POA: Diagnosis not present

## 2015-05-11 DIAGNOSIS — M6281 Muscle weakness (generalized): Secondary | ICD-10-CM | POA: Diagnosis not present

## 2015-05-12 DIAGNOSIS — N189 Chronic kidney disease, unspecified: Secondary | ICD-10-CM | POA: Diagnosis not present

## 2015-05-12 DIAGNOSIS — I129 Hypertensive chronic kidney disease with stage 1 through stage 4 chronic kidney disease, or unspecified chronic kidney disease: Secondary | ICD-10-CM | POA: Diagnosis not present

## 2015-05-12 DIAGNOSIS — K7010 Alcoholic hepatitis without ascites: Secondary | ICD-10-CM | POA: Diagnosis not present

## 2015-05-12 DIAGNOSIS — R2689 Other abnormalities of gait and mobility: Secondary | ICD-10-CM | POA: Diagnosis not present

## 2015-05-12 DIAGNOSIS — K760 Fatty (change of) liver, not elsewhere classified: Secondary | ICD-10-CM | POA: Diagnosis not present

## 2015-05-12 DIAGNOSIS — M6281 Muscle weakness (generalized): Secondary | ICD-10-CM | POA: Diagnosis not present

## 2015-05-12 NOTE — Progress Notes (Signed)
This encounter was created in error - please disregard.

## 2015-05-13 ENCOUNTER — Encounter: Payer: Self-pay | Admitting: Cardiovascular Disease

## 2015-05-13 DIAGNOSIS — R2689 Other abnormalities of gait and mobility: Secondary | ICD-10-CM | POA: Diagnosis not present

## 2015-05-13 DIAGNOSIS — K760 Fatty (change of) liver, not elsewhere classified: Secondary | ICD-10-CM | POA: Diagnosis not present

## 2015-05-13 DIAGNOSIS — N189 Chronic kidney disease, unspecified: Secondary | ICD-10-CM | POA: Diagnosis not present

## 2015-05-13 DIAGNOSIS — K7010 Alcoholic hepatitis without ascites: Secondary | ICD-10-CM | POA: Diagnosis not present

## 2015-05-13 DIAGNOSIS — I129 Hypertensive chronic kidney disease with stage 1 through stage 4 chronic kidney disease, or unspecified chronic kidney disease: Secondary | ICD-10-CM | POA: Diagnosis not present

## 2015-05-13 DIAGNOSIS — M6281 Muscle weakness (generalized): Secondary | ICD-10-CM | POA: Diagnosis not present

## 2015-05-14 DIAGNOSIS — K7010 Alcoholic hepatitis without ascites: Secondary | ICD-10-CM | POA: Diagnosis not present

## 2015-05-20 DIAGNOSIS — K7010 Alcoholic hepatitis without ascites: Secondary | ICD-10-CM | POA: Diagnosis not present

## 2015-05-20 DIAGNOSIS — K760 Fatty (change of) liver, not elsewhere classified: Secondary | ICD-10-CM | POA: Diagnosis not present

## 2015-05-20 DIAGNOSIS — I129 Hypertensive chronic kidney disease with stage 1 through stage 4 chronic kidney disease, or unspecified chronic kidney disease: Secondary | ICD-10-CM | POA: Diagnosis not present

## 2015-05-20 DIAGNOSIS — N189 Chronic kidney disease, unspecified: Secondary | ICD-10-CM | POA: Diagnosis not present

## 2015-05-20 DIAGNOSIS — M6281 Muscle weakness (generalized): Secondary | ICD-10-CM | POA: Diagnosis not present

## 2015-05-20 DIAGNOSIS — R2689 Other abnormalities of gait and mobility: Secondary | ICD-10-CM | POA: Diagnosis not present

## 2015-05-21 DIAGNOSIS — R2689 Other abnormalities of gait and mobility: Secondary | ICD-10-CM | POA: Diagnosis not present

## 2015-05-21 DIAGNOSIS — K760 Fatty (change of) liver, not elsewhere classified: Secondary | ICD-10-CM | POA: Diagnosis not present

## 2015-05-21 DIAGNOSIS — K7010 Alcoholic hepatitis without ascites: Secondary | ICD-10-CM | POA: Diagnosis not present

## 2015-05-21 DIAGNOSIS — I129 Hypertensive chronic kidney disease with stage 1 through stage 4 chronic kidney disease, or unspecified chronic kidney disease: Secondary | ICD-10-CM | POA: Diagnosis not present

## 2015-05-21 DIAGNOSIS — N189 Chronic kidney disease, unspecified: Secondary | ICD-10-CM | POA: Diagnosis not present

## 2015-05-21 DIAGNOSIS — M6281 Muscle weakness (generalized): Secondary | ICD-10-CM | POA: Diagnosis not present

## 2015-05-22 DIAGNOSIS — R2689 Other abnormalities of gait and mobility: Secondary | ICD-10-CM | POA: Diagnosis not present

## 2015-05-22 DIAGNOSIS — K760 Fatty (change of) liver, not elsewhere classified: Secondary | ICD-10-CM | POA: Diagnosis not present

## 2015-05-22 DIAGNOSIS — K7010 Alcoholic hepatitis without ascites: Secondary | ICD-10-CM | POA: Diagnosis not present

## 2015-05-22 DIAGNOSIS — I129 Hypertensive chronic kidney disease with stage 1 through stage 4 chronic kidney disease, or unspecified chronic kidney disease: Secondary | ICD-10-CM | POA: Diagnosis not present

## 2015-05-22 DIAGNOSIS — N189 Chronic kidney disease, unspecified: Secondary | ICD-10-CM | POA: Diagnosis not present

## 2015-05-22 DIAGNOSIS — M6281 Muscle weakness (generalized): Secondary | ICD-10-CM | POA: Diagnosis not present

## 2015-05-27 DIAGNOSIS — K7010 Alcoholic hepatitis without ascites: Secondary | ICD-10-CM | POA: Diagnosis not present

## 2015-05-27 DIAGNOSIS — N189 Chronic kidney disease, unspecified: Secondary | ICD-10-CM | POA: Diagnosis not present

## 2015-05-27 DIAGNOSIS — I129 Hypertensive chronic kidney disease with stage 1 through stage 4 chronic kidney disease, or unspecified chronic kidney disease: Secondary | ICD-10-CM | POA: Diagnosis not present

## 2015-05-27 DIAGNOSIS — M6281 Muscle weakness (generalized): Secondary | ICD-10-CM | POA: Diagnosis not present

## 2015-05-27 DIAGNOSIS — R2689 Other abnormalities of gait and mobility: Secondary | ICD-10-CM | POA: Diagnosis not present

## 2015-05-27 DIAGNOSIS — K760 Fatty (change of) liver, not elsewhere classified: Secondary | ICD-10-CM | POA: Diagnosis not present

## 2015-05-28 DIAGNOSIS — N179 Acute kidney failure, unspecified: Secondary | ICD-10-CM | POA: Diagnosis not present

## 2015-05-29 DIAGNOSIS — N189 Chronic kidney disease, unspecified: Secondary | ICD-10-CM | POA: Diagnosis not present

## 2015-05-29 DIAGNOSIS — I129 Hypertensive chronic kidney disease with stage 1 through stage 4 chronic kidney disease, or unspecified chronic kidney disease: Secondary | ICD-10-CM | POA: Diagnosis not present

## 2015-05-29 DIAGNOSIS — M6281 Muscle weakness (generalized): Secondary | ICD-10-CM | POA: Diagnosis not present

## 2015-05-29 DIAGNOSIS — S63521A Sprain of radiocarpal joint of right wrist, initial encounter: Secondary | ICD-10-CM | POA: Diagnosis not present

## 2015-05-29 DIAGNOSIS — R2689 Other abnormalities of gait and mobility: Secondary | ICD-10-CM | POA: Diagnosis not present

## 2015-05-29 DIAGNOSIS — K7010 Alcoholic hepatitis without ascites: Secondary | ICD-10-CM | POA: Diagnosis not present

## 2015-05-29 DIAGNOSIS — K760 Fatty (change of) liver, not elsewhere classified: Secondary | ICD-10-CM | POA: Diagnosis not present

## 2015-06-03 DIAGNOSIS — K7010 Alcoholic hepatitis without ascites: Secondary | ICD-10-CM | POA: Diagnosis not present

## 2015-06-03 DIAGNOSIS — K760 Fatty (change of) liver, not elsewhere classified: Secondary | ICD-10-CM | POA: Diagnosis not present

## 2015-06-03 DIAGNOSIS — I129 Hypertensive chronic kidney disease with stage 1 through stage 4 chronic kidney disease, or unspecified chronic kidney disease: Secondary | ICD-10-CM | POA: Diagnosis not present

## 2015-06-03 DIAGNOSIS — R2689 Other abnormalities of gait and mobility: Secondary | ICD-10-CM | POA: Diagnosis not present

## 2015-06-03 DIAGNOSIS — M6281 Muscle weakness (generalized): Secondary | ICD-10-CM | POA: Diagnosis not present

## 2015-06-03 DIAGNOSIS — N189 Chronic kidney disease, unspecified: Secondary | ICD-10-CM | POA: Diagnosis not present

## 2015-06-05 DIAGNOSIS — M6281 Muscle weakness (generalized): Secondary | ICD-10-CM | POA: Diagnosis not present

## 2015-06-05 DIAGNOSIS — K760 Fatty (change of) liver, not elsewhere classified: Secondary | ICD-10-CM | POA: Diagnosis not present

## 2015-06-05 DIAGNOSIS — N189 Chronic kidney disease, unspecified: Secondary | ICD-10-CM | POA: Diagnosis not present

## 2015-06-05 DIAGNOSIS — I129 Hypertensive chronic kidney disease with stage 1 through stage 4 chronic kidney disease, or unspecified chronic kidney disease: Secondary | ICD-10-CM | POA: Diagnosis not present

## 2015-06-05 DIAGNOSIS — R2689 Other abnormalities of gait and mobility: Secondary | ICD-10-CM | POA: Diagnosis not present

## 2015-06-05 DIAGNOSIS — K7010 Alcoholic hepatitis without ascites: Secondary | ICD-10-CM | POA: Diagnosis not present

## 2015-06-09 DIAGNOSIS — N189 Chronic kidney disease, unspecified: Secondary | ICD-10-CM | POA: Diagnosis not present

## 2015-06-09 DIAGNOSIS — K7010 Alcoholic hepatitis without ascites: Secondary | ICD-10-CM | POA: Diagnosis not present

## 2015-06-09 DIAGNOSIS — K760 Fatty (change of) liver, not elsewhere classified: Secondary | ICD-10-CM | POA: Diagnosis not present

## 2015-06-09 DIAGNOSIS — M6281 Muscle weakness (generalized): Secondary | ICD-10-CM | POA: Diagnosis not present

## 2015-06-09 DIAGNOSIS — R2689 Other abnormalities of gait and mobility: Secondary | ICD-10-CM | POA: Diagnosis not present

## 2015-06-09 DIAGNOSIS — I129 Hypertensive chronic kidney disease with stage 1 through stage 4 chronic kidney disease, or unspecified chronic kidney disease: Secondary | ICD-10-CM | POA: Diagnosis not present

## 2015-06-11 DIAGNOSIS — K7010 Alcoholic hepatitis without ascites: Secondary | ICD-10-CM | POA: Diagnosis not present

## 2015-06-11 DIAGNOSIS — I129 Hypertensive chronic kidney disease with stage 1 through stage 4 chronic kidney disease, or unspecified chronic kidney disease: Secondary | ICD-10-CM | POA: Diagnosis not present

## 2015-06-11 DIAGNOSIS — M6281 Muscle weakness (generalized): Secondary | ICD-10-CM | POA: Diagnosis not present

## 2015-06-11 DIAGNOSIS — K760 Fatty (change of) liver, not elsewhere classified: Secondary | ICD-10-CM | POA: Diagnosis not present

## 2015-06-11 DIAGNOSIS — N189 Chronic kidney disease, unspecified: Secondary | ICD-10-CM | POA: Diagnosis not present

## 2015-06-11 DIAGNOSIS — R2689 Other abnormalities of gait and mobility: Secondary | ICD-10-CM | POA: Diagnosis not present

## 2015-06-18 ENCOUNTER — Encounter: Payer: Self-pay | Admitting: Cardiovascular Disease

## 2015-06-26 DIAGNOSIS — K7010 Alcoholic hepatitis without ascites: Secondary | ICD-10-CM | POA: Diagnosis not present

## 2015-06-26 DIAGNOSIS — N189 Chronic kidney disease, unspecified: Secondary | ICD-10-CM | POA: Diagnosis not present

## 2015-06-26 DIAGNOSIS — R2689 Other abnormalities of gait and mobility: Secondary | ICD-10-CM | POA: Diagnosis not present

## 2015-06-26 DIAGNOSIS — I129 Hypertensive chronic kidney disease with stage 1 through stage 4 chronic kidney disease, or unspecified chronic kidney disease: Secondary | ICD-10-CM | POA: Diagnosis not present

## 2015-06-26 DIAGNOSIS — K760 Fatty (change of) liver, not elsewhere classified: Secondary | ICD-10-CM | POA: Diagnosis not present

## 2015-06-26 DIAGNOSIS — M6281 Muscle weakness (generalized): Secondary | ICD-10-CM | POA: Diagnosis not present

## 2015-07-27 DIAGNOSIS — F411 Generalized anxiety disorder: Secondary | ICD-10-CM | POA: Diagnosis not present

## 2015-07-27 DIAGNOSIS — F5101 Primary insomnia: Secondary | ICD-10-CM | POA: Diagnosis not present

## 2015-08-12 DIAGNOSIS — R109 Unspecified abdominal pain: Secondary | ICD-10-CM | POA: Diagnosis not present

## 2015-08-20 DIAGNOSIS — F411 Generalized anxiety disorder: Secondary | ICD-10-CM | POA: Diagnosis not present

## 2015-08-20 DIAGNOSIS — F328 Other depressive episodes: Secondary | ICD-10-CM | POA: Diagnosis not present

## 2015-08-28 DIAGNOSIS — Z23 Encounter for immunization: Secondary | ICD-10-CM | POA: Diagnosis not present

## 2015-09-15 DIAGNOSIS — M7062 Trochanteric bursitis, left hip: Secondary | ICD-10-CM | POA: Diagnosis not present

## 2015-09-15 DIAGNOSIS — M545 Low back pain: Secondary | ICD-10-CM | POA: Diagnosis not present

## 2015-09-15 DIAGNOSIS — M5116 Intervertebral disc disorders with radiculopathy, lumbar region: Secondary | ICD-10-CM | POA: Diagnosis not present

## 2015-09-25 DIAGNOSIS — M7062 Trochanteric bursitis, left hip: Secondary | ICD-10-CM | POA: Diagnosis not present

## 2015-10-06 DIAGNOSIS — M545 Low back pain: Secondary | ICD-10-CM | POA: Diagnosis not present

## 2015-10-06 DIAGNOSIS — M7062 Trochanteric bursitis, left hip: Secondary | ICD-10-CM | POA: Diagnosis not present

## 2015-10-16 DIAGNOSIS — M545 Low back pain: Secondary | ICD-10-CM | POA: Diagnosis not present

## 2015-10-26 DIAGNOSIS — M5416 Radiculopathy, lumbar region: Secondary | ICD-10-CM | POA: Diagnosis not present

## 2015-10-26 DIAGNOSIS — Z6829 Body mass index (BMI) 29.0-29.9, adult: Secondary | ICD-10-CM | POA: Diagnosis not present

## 2015-10-29 ENCOUNTER — Other Ambulatory Visit: Payer: Self-pay | Admitting: Neurosurgery

## 2015-10-29 DIAGNOSIS — M5416 Radiculopathy, lumbar region: Secondary | ICD-10-CM

## 2015-11-03 ENCOUNTER — Ambulatory Visit
Admission: RE | Admit: 2015-11-03 | Discharge: 2015-11-03 | Disposition: A | Discharge status: Home / Self Care | Payer: Medicare Other | Source: Ambulatory Visit | Attending: Neurosurgery | Admitting: Neurosurgery

## 2015-11-03 DIAGNOSIS — M5126 Other intervertebral disc displacement, lumbar region: Secondary | ICD-10-CM | POA: Diagnosis not present

## 2015-11-03 DIAGNOSIS — M5416 Radiculopathy, lumbar region: Secondary | ICD-10-CM

## 2015-11-03 DIAGNOSIS — M4806 Spinal stenosis, lumbar region: Secondary | ICD-10-CM | POA: Diagnosis not present

## 2015-11-03 MED ORDER — ONDANSETRON HCL 4 MG/2ML IJ SOLN
4.0000 mg | Freq: Once | INTRAMUSCULAR | Status: AC
  Administered 2015-11-03: 4 mg via INTRAMUSCULAR

## 2015-11-03 MED ORDER — IOHEXOL 180 MG/ML  SOLN
15.0000 mL | Freq: Once | INTRAMUSCULAR | Status: DC | PRN
  Administered 2015-11-03: 15 mL via INTRATHECAL

## 2015-11-03 MED ORDER — MEPERIDINE HCL 100 MG/ML IJ SOLN
75.0000 mg | Freq: Once | INTRAMUSCULAR | Status: AC
  Administered 2015-11-03: 75 mg via INTRAMUSCULAR

## 2015-11-03 MED ORDER — DIAZEPAM 5 MG PO TABS
5.0000 mg | ORAL_TABLET | Freq: Once | ORAL | Status: AC
  Administered 2015-11-03: 5 mg via ORAL

## 2015-11-03 NOTE — Discharge Instructions (Signed)
Myelogram Discharge Instructions  1. Go home and rest quietly for the next 24 hours.  It is important to lie flat for the next 24 hours.  Get up only to go to the restroom.  You may lie in the bed or on a couch on your back, your stomach, your left side or your right side.  You may have one pillow under your head.  You may have pillows between your knees while you are on your side or under your knees while you are on your back.  2. DO NOT drive today.  Recline the seat as far back as it will go, while still wearing your seat belt, on the way home.  3. You may get up to go to the bathroom as needed.  You may sit up for 10 minutes to eat.  You may resume your normal diet and medications unless otherwise indicated.  Drink lots of extra fluids today and tomorrow.  4. The incidence of headache, nausea, or vomiting is about 5% (one in 20 patients).  If you develop a headache, lie flat and drink plenty of fluids until the headache goes away.  Caffeinated beverages may be helpful.  If you develop severe nausea and vomiting or a headache that does not go away with flat bed rest, call [phone].  5. You may resume normal activities after your 24 hours of bed rest is over; however, do not exert yourself strongly or do any heavy lifting tomorrow. If when you get up you have a headache when standing, go back to bed and force fluids for another 24 hours.  6. Call your physician for a follow-up appointment.  The results of your myelogram will be sent directly to your physician by the following day.  7. If you have any questions or if complications develop after you arrive home, please call [phone].  Discharge instructions have been explained to the patient.  The patient, or the person responsible for the patient, fully understands these instructions.       May resume Wellbutrin and Celexa on Nov. 16, 2016, after 8:30 am.

## 2015-11-03 NOTE — Progress Notes (Signed)
Patient states he has been off Wellbutrin and Celexa for the past two days.

## 2015-11-06 DIAGNOSIS — Z6829 Body mass index (BMI) 29.0-29.9, adult: Secondary | ICD-10-CM | POA: Diagnosis not present

## 2015-11-06 DIAGNOSIS — M5416 Radiculopathy, lumbar region: Secondary | ICD-10-CM | POA: Diagnosis not present

## 2015-12-10 DIAGNOSIS — F320 Major depressive disorder, single episode, mild: Secondary | ICD-10-CM | POA: Diagnosis not present

## 2015-12-10 DIAGNOSIS — F5101 Primary insomnia: Secondary | ICD-10-CM | POA: Diagnosis not present

## 2015-12-10 DIAGNOSIS — F411 Generalized anxiety disorder: Secondary | ICD-10-CM | POA: Diagnosis not present

## 2015-12-24 IMAGING — CT CT TEMPORAL BONES W/ CM
3 of 4 series · 17 of 30 positions shown, 19 images · IV contrast (75CC OMNI 300)
Comparison: None.

CLINICAL DATA: Bilateral hearing loss.  Tinnitus.

EXAM:
CT TEMPORAL BONES WITH CONTRAST
TECHNIQUE: Axial and coronal plane CT imaging of the petrous temporal bones was
performed with thin-collimation image reconstruction after
intravenous contrast administration. Multiplanar CT image
reconstructions were also generated.
CONTRAST:  75mL OMNIPAQUE IOHEXOL 300 MG/ML  SOLN

[Series 3: ax mag right · axial · 0.19mm/px · z∈[-16,+42]mm · 7 of 248 slices shown, 9 images]
[im 31/248  brain]
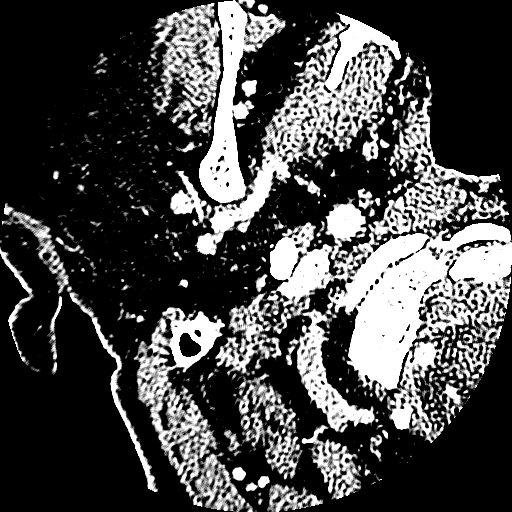
[im 31/248  bone]
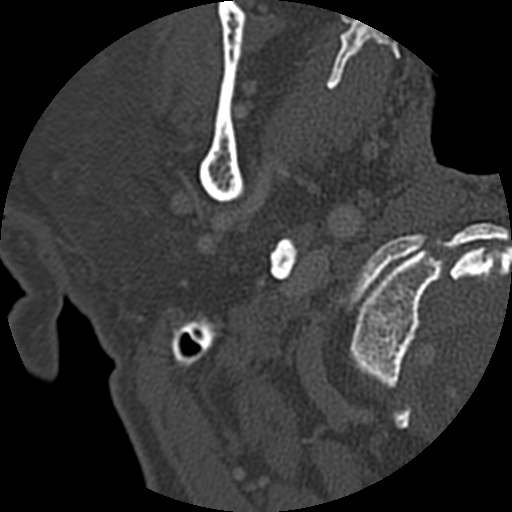
[im 62/248  bone]
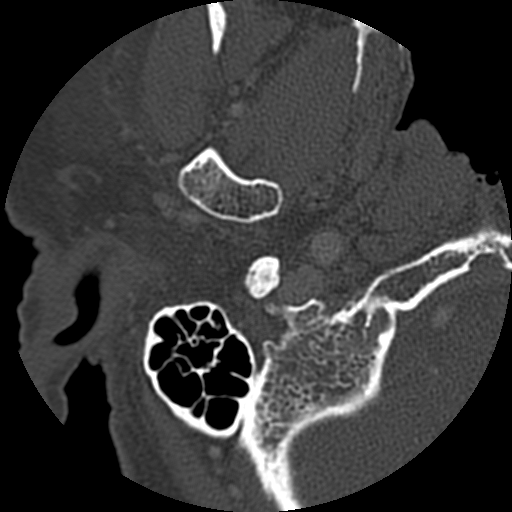
[im 93/248  bone]
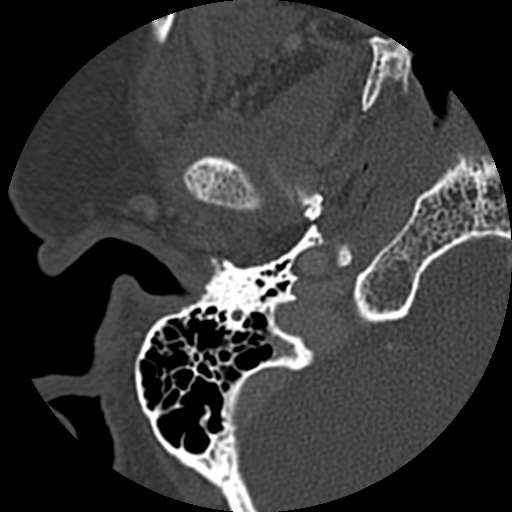
[im 124/248  bone]
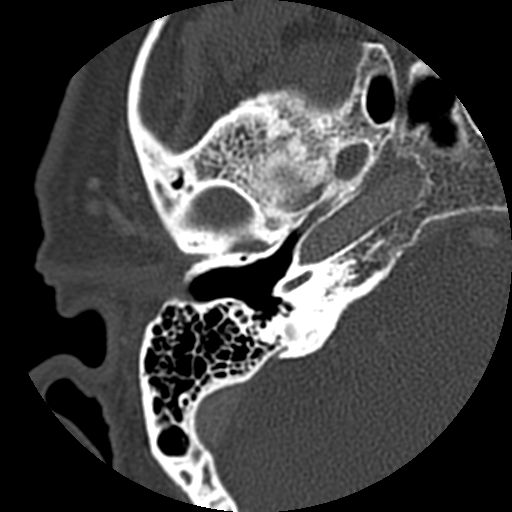
[im 155/248  brain]
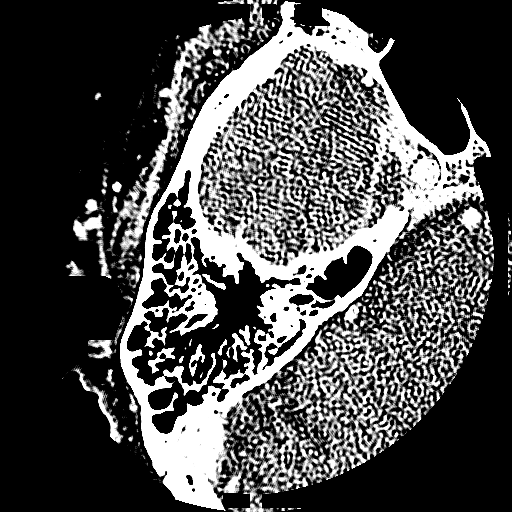
[im 155/248  bone]
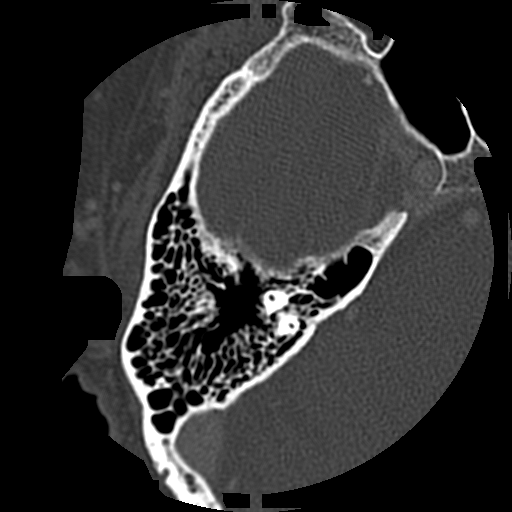
[im 186/248  bone]
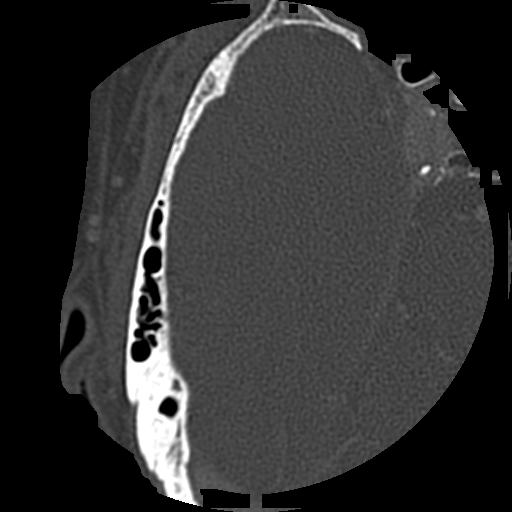
[im 217/248  bone]
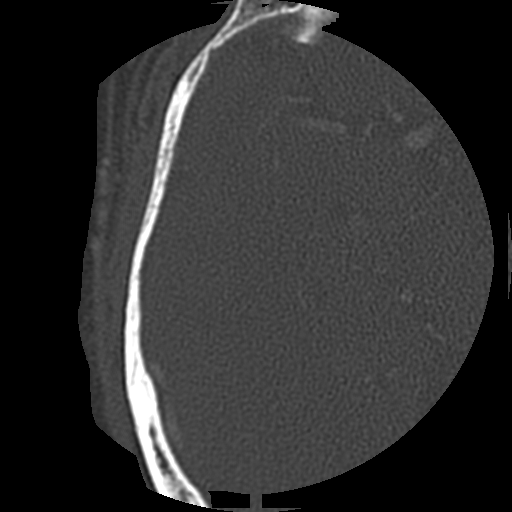

[Series 4: ax mag left · axial · 0.19mm/px · z∈[-16,+42]mm · 7 of 248 slices shown]
[im 31/248  bone]
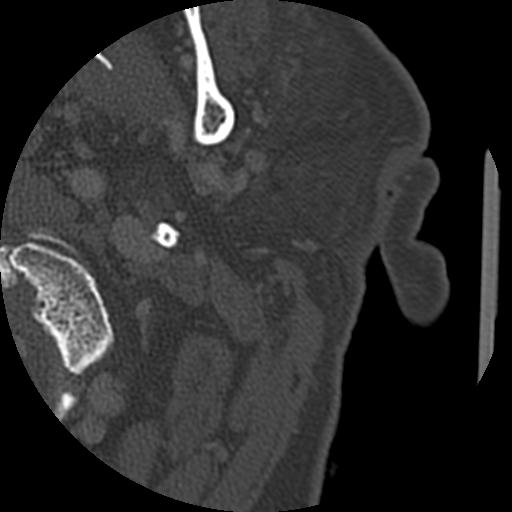
[im 62/248  bone]
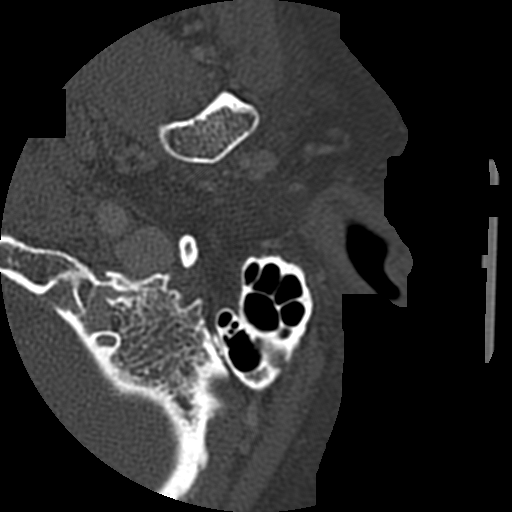
[im 93/248  bone]
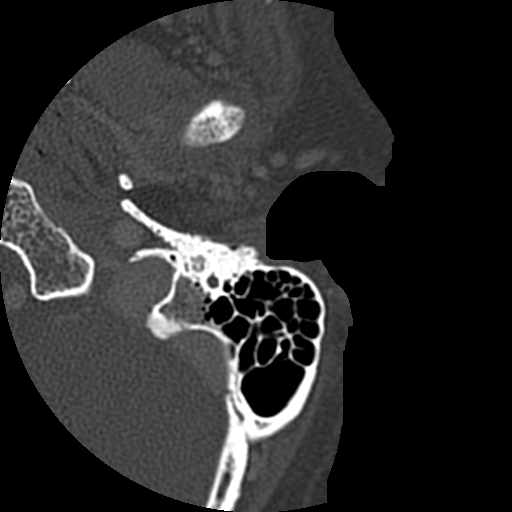
[im 124/248  bone]
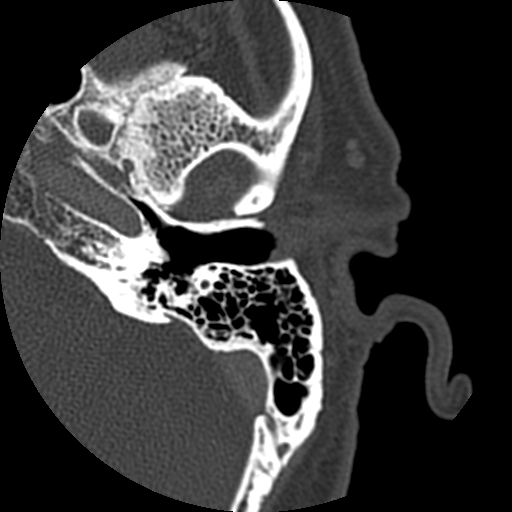
[im 155/248  bone]
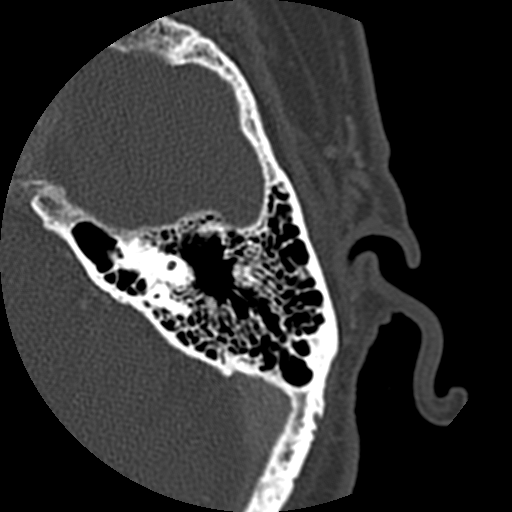
[im 186/248  bone]
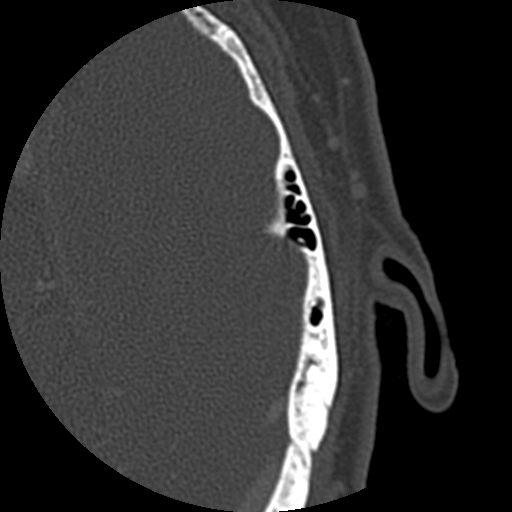
[im 217/248  bone]
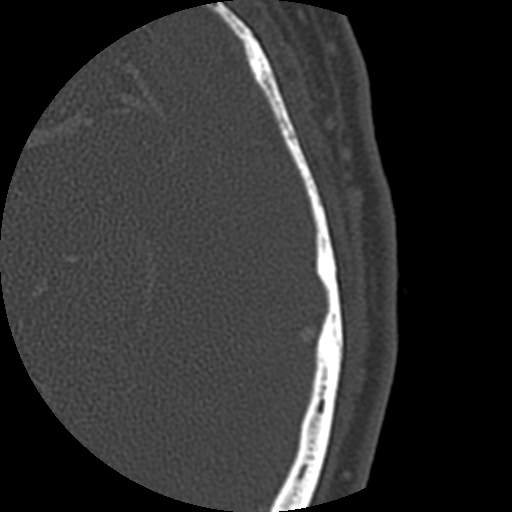

[Series 103: cor soft tissue · coronal · 0.33mm/px · 3 of 307 slices shown]
[im 31/307  brain]
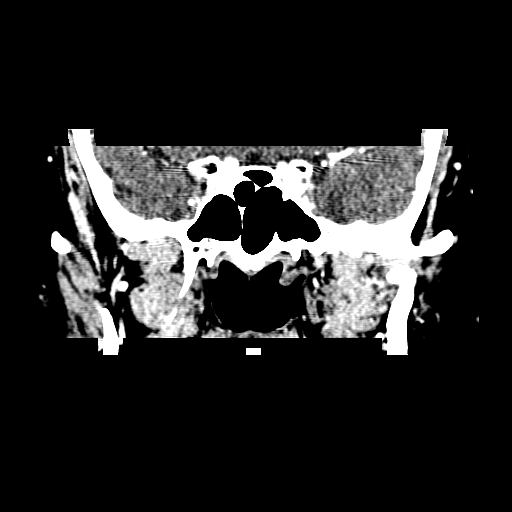
[im 62/307  brain]
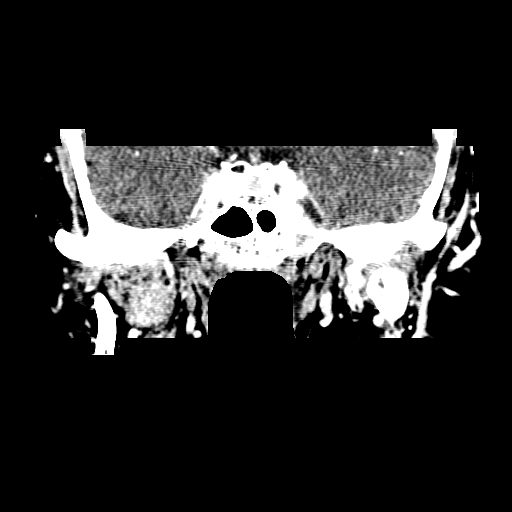
[im 92/307  brain]
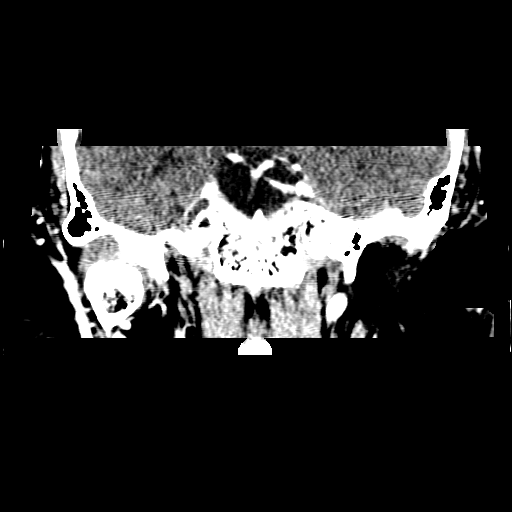

[17 of 30 positions shown; findings below may reference images not displayed]

FINDINGS: Mucoperiosteal thickening of the right maxillary sinus consistent
with chronic sinusitis. Mucous retention cyst left maxillary sinus.
Medial antrectomy of the maxillary sinus bilaterally.

No intracranial enhancing mass lesion. No evidence of vestibular
schwannoma. MRI is more sensitive than CT for vestibular schwannoma.

Right temporal bone: Right mastoid sinus is well developed and
clear. Right middle ear is clear. Ossicles are normal. Internal
auditory canal is normal in caliber. Semicircular canals and cochlea
appear normal. Vestibular aqueduct is normal in caliber.

Left temporal bone: Mastoid sinus is well developed and clear.
Middle ear is clear. Ossicles are normal. Inner ear structures are
normal. Vestibular aqueduct is normal in caliber.
IMPRESSION: Chronic paranasal sinusitis.

Normal temporal bones bilaterally.

## 2016-01-06 DIAGNOSIS — S20212A Contusion of left front wall of thorax, initial encounter: Secondary | ICD-10-CM | POA: Diagnosis not present

## 2016-01-06 DIAGNOSIS — R0781 Pleurodynia: Secondary | ICD-10-CM | POA: Diagnosis not present

## 2016-01-06 DIAGNOSIS — R079 Chest pain, unspecified: Secondary | ICD-10-CM | POA: Diagnosis not present

## 2016-01-10 ENCOUNTER — Emergency Department (HOSPITAL_COMMUNITY): Payer: Medicare Other

## 2016-01-10 ENCOUNTER — Inpatient Hospital Stay (HOSPITAL_COMMUNITY)
Admission: EM | Admit: 2016-01-10 | Discharge: 2016-01-19 | DRG: 917 | Disposition: A | Discharge status: Home / Self Care | Payer: Medicare Other | Attending: Internal Medicine | Admitting: Internal Medicine

## 2016-01-10 ENCOUNTER — Encounter (HOSPITAL_COMMUNITY): Payer: Self-pay | Admitting: Emergency Medicine

## 2016-01-10 DIAGNOSIS — A4181 Sepsis due to Enterococcus: Secondary | ICD-10-CM | POA: Diagnosis present

## 2016-01-10 DIAGNOSIS — I25119 Atherosclerotic heart disease of native coronary artery with unspecified angina pectoris: Secondary | ICD-10-CM | POA: Diagnosis not present

## 2016-01-10 DIAGNOSIS — R296 Repeated falls: Secondary | ICD-10-CM | POA: Diagnosis present

## 2016-01-10 DIAGNOSIS — J9601 Acute respiratory failure with hypoxia: Secondary | ICD-10-CM | POA: Diagnosis not present

## 2016-01-10 DIAGNOSIS — I426 Alcoholic cardiomyopathy: Secondary | ICD-10-CM | POA: Diagnosis present

## 2016-01-10 DIAGNOSIS — I129 Hypertensive chronic kidney disease with stage 1 through stage 4 chronic kidney disease, or unspecified chronic kidney disease: Secondary | ICD-10-CM | POA: Diagnosis present

## 2016-01-10 DIAGNOSIS — R778 Other specified abnormalities of plasma proteins: Secondary | ICD-10-CM | POA: Diagnosis present

## 2016-01-10 DIAGNOSIS — I214 Non-ST elevation (NSTEMI) myocardial infarction: Secondary | ICD-10-CM | POA: Diagnosis present

## 2016-01-10 DIAGNOSIS — F419 Anxiety disorder, unspecified: Secondary | ICD-10-CM | POA: Diagnosis present

## 2016-01-10 DIAGNOSIS — F1423 Cocaine dependence with withdrawal: Secondary | ICD-10-CM | POA: Diagnosis present

## 2016-01-10 DIAGNOSIS — F4489 Other dissociative and conversion disorders: Secondary | ICD-10-CM | POA: Diagnosis not present

## 2016-01-10 DIAGNOSIS — R6520 Severe sepsis without septic shock: Secondary | ICD-10-CM

## 2016-01-10 DIAGNOSIS — E876 Hypokalemia: Secondary | ICD-10-CM | POA: Diagnosis present

## 2016-01-10 DIAGNOSIS — W010XXA Fall on same level from slipping, tripping and stumbling without subsequent striking against object, initial encounter: Secondary | ICD-10-CM

## 2016-01-10 DIAGNOSIS — R079 Chest pain, unspecified: Secondary | ICD-10-CM | POA: Diagnosis not present

## 2016-01-10 DIAGNOSIS — Z886 Allergy status to analgesic agent status: Secondary | ICD-10-CM

## 2016-01-10 DIAGNOSIS — Z7982 Long term (current) use of aspirin: Secondary | ICD-10-CM

## 2016-01-10 DIAGNOSIS — Z978 Presence of other specified devices: Secondary | ICD-10-CM

## 2016-01-10 DIAGNOSIS — Z8673 Personal history of transient ischemic attack (TIA), and cerebral infarction without residual deficits: Secondary | ICD-10-CM

## 2016-01-10 DIAGNOSIS — K700 Alcoholic fatty liver: Secondary | ICD-10-CM

## 2016-01-10 DIAGNOSIS — D7589 Other specified diseases of blood and blood-forming organs: Secondary | ICD-10-CM | POA: Diagnosis present

## 2016-01-10 DIAGNOSIS — I209 Angina pectoris, unspecified: Secondary | ICD-10-CM

## 2016-01-10 DIAGNOSIS — F10129 Alcohol abuse with intoxication, unspecified: Secondary | ICD-10-CM | POA: Diagnosis not present

## 2016-01-10 DIAGNOSIS — F10231 Alcohol dependence with withdrawal delirium: Secondary | ICD-10-CM | POA: Diagnosis not present

## 2016-01-10 DIAGNOSIS — Z79899 Other long term (current) drug therapy: Secondary | ICD-10-CM

## 2016-01-10 DIAGNOSIS — R0781 Pleurodynia: Secondary | ICD-10-CM | POA: Diagnosis not present

## 2016-01-10 DIAGNOSIS — I6789 Other cerebrovascular disease: Secondary | ICD-10-CM | POA: Diagnosis not present

## 2016-01-10 DIAGNOSIS — J15211 Pneumonia due to Methicillin susceptible Staphylococcus aureus: Secondary | ICD-10-CM | POA: Diagnosis present

## 2016-01-10 DIAGNOSIS — Z8546 Personal history of malignant neoplasm of prostate: Secondary | ICD-10-CM

## 2016-01-10 DIAGNOSIS — Y907 Blood alcohol level of 200-239 mg/100 ml: Secondary | ICD-10-CM | POA: Diagnosis present

## 2016-01-10 DIAGNOSIS — T405X4A Poisoning by cocaine, undetermined, initial encounter: Principal | ICD-10-CM | POA: Diagnosis present

## 2016-01-10 DIAGNOSIS — Z720 Tobacco use: Secondary | ICD-10-CM | POA: Diagnosis present

## 2016-01-10 DIAGNOSIS — J690 Pneumonitis due to inhalation of food and vomit: Secondary | ICD-10-CM | POA: Diagnosis not present

## 2016-01-10 DIAGNOSIS — W19XXXA Unspecified fall, initial encounter: Secondary | ICD-10-CM

## 2016-01-10 DIAGNOSIS — I2510 Atherosclerotic heart disease of native coronary artery without angina pectoris: Secondary | ICD-10-CM

## 2016-01-10 DIAGNOSIS — R7989 Other specified abnormal findings of blood chemistry: Secondary | ICD-10-CM

## 2016-01-10 DIAGNOSIS — A4901 Methicillin susceptible Staphylococcus aureus infection, unspecified site: Secondary | ICD-10-CM | POA: Diagnosis present

## 2016-01-10 DIAGNOSIS — H8109 Meniere's disease, unspecified ear: Secondary | ICD-10-CM | POA: Diagnosis present

## 2016-01-10 DIAGNOSIS — N189 Chronic kidney disease, unspecified: Secondary | ICD-10-CM | POA: Diagnosis present

## 2016-01-10 DIAGNOSIS — F1910 Other psychoactive substance abuse, uncomplicated: Secondary | ICD-10-CM | POA: Diagnosis present

## 2016-01-10 DIAGNOSIS — F17210 Nicotine dependence, cigarettes, uncomplicated: Secondary | ICD-10-CM | POA: Diagnosis present

## 2016-01-10 DIAGNOSIS — G9340 Encephalopathy, unspecified: Secondary | ICD-10-CM | POA: Diagnosis not present

## 2016-01-10 DIAGNOSIS — G9341 Metabolic encephalopathy: Secondary | ICD-10-CM | POA: Diagnosis present

## 2016-01-10 DIAGNOSIS — N179 Acute kidney failure, unspecified: Secondary | ICD-10-CM | POA: Diagnosis present

## 2016-01-10 DIAGNOSIS — I509 Heart failure, unspecified: Secondary | ICD-10-CM | POA: Diagnosis present

## 2016-01-10 DIAGNOSIS — F10929 Alcohol use, unspecified with intoxication, unspecified: Secondary | ICD-10-CM

## 2016-01-10 DIAGNOSIS — F1010 Alcohol abuse, uncomplicated: Secondary | ICD-10-CM

## 2016-01-10 DIAGNOSIS — F329 Major depressive disorder, single episode, unspecified: Secondary | ICD-10-CM | POA: Diagnosis present

## 2016-01-10 DIAGNOSIS — K760 Fatty (change of) liver, not elsewhere classified: Secondary | ICD-10-CM | POA: Diagnosis present

## 2016-01-10 DIAGNOSIS — S0990XA Unspecified injury of head, initial encounter: Secondary | ICD-10-CM | POA: Diagnosis not present

## 2016-01-10 DIAGNOSIS — F10931 Alcohol use, unspecified with withdrawal delirium: Secondary | ICD-10-CM | POA: Diagnosis not present

## 2016-01-10 DIAGNOSIS — Z923 Personal history of irradiation: Secondary | ICD-10-CM

## 2016-01-10 DIAGNOSIS — S2232XA Fracture of one rib, left side, initial encounter for closed fracture: Secondary | ICD-10-CM

## 2016-01-10 DIAGNOSIS — H9190 Unspecified hearing loss, unspecified ear: Secondary | ICD-10-CM | POA: Diagnosis present

## 2016-01-10 DIAGNOSIS — S2239XA Fracture of one rib, unspecified side, initial encounter for closed fracture: Secondary | ICD-10-CM

## 2016-01-10 DIAGNOSIS — Z888 Allergy status to other drugs, medicaments and biological substances status: Secondary | ICD-10-CM

## 2016-01-10 DIAGNOSIS — F1410 Cocaine abuse, uncomplicated: Secondary | ICD-10-CM | POA: Diagnosis present

## 2016-01-10 HISTORY — DX: Alcoholic fatty liver: K70.0

## 2016-01-10 HISTORY — DX: Atherosclerotic heart disease of native coronary artery without angina pectoris: I25.10

## 2016-01-10 LAB — RAPID URINE DRUG SCREEN, HOSP PERFORMED
AMPHETAMINES: NOT DETECTED
BARBITURATES: NOT DETECTED
Benzodiazepines: POSITIVE — AB
Cocaine: POSITIVE — AB
Opiates: POSITIVE — AB
Tetrahydrocannabinol: NOT DETECTED

## 2016-01-10 LAB — COMPREHENSIVE METABOLIC PANEL
ALK PHOS: 57 U/L (ref 38–126)
ALT: 22 U/L (ref 17–63)
AST: 24 U/L (ref 15–41)
Albumin: 3.3 g/dL — ABNORMAL LOW (ref 3.5–5.0)
Anion gap: 11 (ref 5–15)
BILIRUBIN TOTAL: 0.7 mg/dL (ref 0.3–1.2)
BUN: 10 mg/dL (ref 6–20)
CALCIUM: 9 mg/dL (ref 8.9–10.3)
CO2: 25 mmol/L (ref 22–32)
CREATININE: 0.98 mg/dL (ref 0.61–1.24)
Chloride: 104 mmol/L (ref 101–111)
Glucose, Bld: 101 mg/dL — ABNORMAL HIGH (ref 65–99)
Potassium: 4.1 mmol/L (ref 3.5–5.1)
Sodium: 140 mmol/L (ref 135–145)
TOTAL PROTEIN: 6.1 g/dL — AB (ref 6.5–8.1)

## 2016-01-10 LAB — CBC
HEMATOCRIT: 39.9 % (ref 39.0–52.0)
HEMOGLOBIN: 13.9 g/dL (ref 13.0–17.0)
MCH: 36.2 pg — AB (ref 26.0–34.0)
MCHC: 34.8 g/dL (ref 30.0–36.0)
MCV: 103.9 fL — AB (ref 78.0–100.0)
PLATELETS: 193 10*3/uL (ref 150–400)
RBC: 3.84 MIL/uL — AB (ref 4.22–5.81)
RDW: 13.4 % (ref 11.5–15.5)
WBC: 7.5 10*3/uL (ref 4.0–10.5)

## 2016-01-10 LAB — CK TOTAL AND CKMB (NOT AT ARMC)
CK TOTAL: 47 U/L — AB (ref 49–397)
CK, MB: 1.3 ng/mL (ref 0.5–5.0)
Relative Index: INVALID (ref 0.0–2.5)

## 2016-01-10 LAB — TROPONIN I: TROPONIN I: 4.59 ng/mL — AB (ref <0.031)

## 2016-01-10 LAB — ETHANOL: ALCOHOL ETHYL (B): 210 mg/dL — AB (ref <5)

## 2016-01-10 MED ORDER — ONDANSETRON HCL 4 MG/2ML IJ SOLN: 4.0000 mg | Freq: Four times a day (QID) | INTRAMUSCULAR | Status: DC | PRN

## 2016-01-10 MED ORDER — SODIUM CHLORIDE 0.9 % IV SOLN
INTRAVENOUS | Status: DC
  Administered 2016-01-11: 05:00:00 via INTRAVENOUS
  Administered 2016-01-16: 10 mL/h via INTRAVENOUS

## 2016-01-10 MED ORDER — PNEUMOCOCCAL VAC POLYVALENT 25 MCG/0.5ML IJ INJ
0.5000 mL | INJECTION | INTRAMUSCULAR | Status: DC
  Filled 2016-01-10: qty 0.5

## 2016-01-10 MED ORDER — HYDROCODONE-ACETAMINOPHEN 5-325 MG PO TABS
1.0000 | ORAL_TABLET | Freq: Four times a day (QID) | ORAL | Status: DC | PRN
  Administered 2016-01-10: 1 via ORAL
  Filled 2016-01-10: qty 1

## 2016-01-10 MED ORDER — ACETAMINOPHEN 325 MG PO TABS: 650.0000 mg | ORAL_TABLET | ORAL | Status: DC | PRN

## 2016-01-10 MED ORDER — LORAZEPAM 2 MG/ML IJ SOLN
0.0000 mg | Freq: Four times a day (QID) | INTRAMUSCULAR | Status: DC
Start: 2016-01-10 — End: 2016-01-11
  Administered 2016-01-10 – 2016-01-11 (×2): 2 mg via INTRAVENOUS
  Filled 2016-01-10 (×2): qty 1

## 2016-01-10 MED ORDER — ASPIRIN EC 81 MG PO TBEC
81.0000 mg | DELAYED_RELEASE_TABLET | Freq: Every day | ORAL | Status: DC
  Administered 2016-01-10 – 2016-01-12 (×3): 81 mg via ORAL
  Filled 2016-01-10 (×4): qty 1

## 2016-01-10 MED ORDER — ENOXAPARIN SODIUM 40 MG/0.4ML ~~LOC~~ SOLN
40.0000 mg | SUBCUTANEOUS | Status: DC
  Administered 2016-01-10: 40 mg via SUBCUTANEOUS
  Filled 2016-01-10: qty 0.4

## 2016-01-10 MED ORDER — MAGNESIUM OXIDE 400 (241.3 MG) MG PO TABS
400.0000 mg | ORAL_TABLET | Freq: Every day | ORAL | Status: DC
  Administered 2016-01-10: 400 mg via ORAL
  Filled 2016-01-10 (×3): qty 1

## 2016-01-10 MED ORDER — THIAMINE HCL 100 MG/ML IJ SOLN
INTRAVENOUS | Status: DC
  Administered 2016-01-10: 21:00:00 via INTRAVENOUS
  Filled 2016-01-10 (×4): qty 1000

## 2016-01-10 MED ORDER — LORAZEPAM 2 MG/ML IJ SOLN: 0.0000 mg | Freq: Two times a day (BID) | INTRAMUSCULAR | Status: DC

## 2016-01-10 MED ORDER — CITALOPRAM HYDROBROMIDE 20 MG PO TABS
20.0000 mg | ORAL_TABLET | Freq: Every day | ORAL | Status: DC
  Administered 2016-01-10: 20 mg via ORAL
  Filled 2016-01-10: qty 1

## 2016-01-10 MED ORDER — TAMSULOSIN HCL 0.4 MG PO CAPS
0.4000 mg | ORAL_CAPSULE | Freq: Every day | ORAL | Status: DC
  Administered 2016-01-10 – 2016-01-18 (×7): 0.4 mg via ORAL
  Filled 2016-01-10 (×11): qty 1

## 2016-01-10 MED ORDER — THIAMINE HCL 100 MG/ML IJ SOLN
100.0000 mg | Freq: Every day | INTRAMUSCULAR | Status: DC
  Administered 2016-01-13: 100 mg via INTRAVENOUS
  Filled 2016-01-10 (×2): qty 1
  Filled 2016-01-10: qty 2
  Filled 2016-01-10 (×2): qty 1

## 2016-01-10 MED ORDER — VITAMIN B-1 100 MG PO TABS
100.0000 mg | ORAL_TABLET | Freq: Every day | ORAL | Status: DC
  Administered 2016-01-11 – 2016-01-19 (×7): 100 mg via ORAL
  Filled 2016-01-10 (×10): qty 1

## 2016-01-10 MED ORDER — MORPHINE SULFATE (PF) 2 MG/ML IV SOLN
2.0000 mg | INTRAVENOUS | Status: DC | PRN
  Administered 2016-01-10 – 2016-01-11 (×3): 2 mg via INTRAVENOUS
  Filled 2016-01-10 (×3): qty 1

## 2016-01-10 MED ORDER — CO Q-10 200 MG PO CAPS: 200.0000 mg | ORAL_CAPSULE | Freq: Every day | ORAL | Status: DC

## 2016-01-10 MED ORDER — ADULT MULTIVITAMIN W/MINERALS CH
1.0000 | ORAL_TABLET | Freq: Every day | ORAL | Status: DC
  Administered 2016-01-11 – 2016-01-19 (×8): 1 via ORAL
  Filled 2016-01-10 (×11): qty 1

## 2016-01-10 MED ORDER — VITAMIN D3 25 MCG (1000 UNIT) PO TABS
2000.0000 [IU] | ORAL_TABLET | Freq: Every day | ORAL | Status: DC
  Administered 2016-01-11 – 2016-01-12 (×2): 2000 [IU] via ORAL
  Filled 2016-01-10 (×3): qty 2

## 2016-01-10 MED ORDER — BUPROPION HCL 100 MG PO TABS
200.0000 mg | ORAL_TABLET | Freq: Every day | ORAL | Status: DC
  Filled 2016-01-10: qty 2

## 2016-01-10 MED ORDER — LORAZEPAM 2 MG/ML IJ SOLN
1.0000 mg | Freq: Four times a day (QID) | INTRAMUSCULAR | Status: DC | PRN
  Administered 2016-01-11: 1 mg via INTRAVENOUS
  Filled 2016-01-10 (×2): qty 1

## 2016-01-10 MED ORDER — LORAZEPAM 1 MG PO TABS: 1.0000 mg | ORAL_TABLET | Freq: Four times a day (QID) | ORAL | Status: DC | PRN

## 2016-01-10 MED ORDER — FOLIC ACID 1 MG PO TABS
1.0000 mg | ORAL_TABLET | Freq: Every day | ORAL | Status: DC
  Administered 2016-01-11 – 2016-01-19 (×8): 1 mg via ORAL
  Filled 2016-01-10 (×10): qty 1

## 2016-01-10 MED ORDER — OMEGA-3-ACID ETHYL ESTERS 1 G PO CAPS
1.0000 g | ORAL_CAPSULE | Freq: Every day | ORAL | Status: DC
  Administered 2016-01-10 – 2016-01-11 (×2): 1 g via ORAL
  Filled 2016-01-10 (×3): qty 1

## 2016-01-10 MED ORDER — CARVEDILOL 3.125 MG PO TABS: 3.1250 mg | ORAL_TABLET | Freq: Two times a day (BID) | ORAL | Status: DC

## 2016-01-10 NOTE — ED Notes (Signed)
Pt climbed out of bed in attempt to go to the bathroom. Pulled out both IVs. Re-established access.

## 2016-01-10 NOTE — ED Provider Notes (Addendum)
CSN: [id number]     Arrival date & time 01/10/16  1654 History   First MD Initiated Contact with Patient 01/10/16 1658     Chief Complaint  Patient presents with  . Altered Mental Status     (Consider location/radiation/quality/duration/timing/severity/associated sxs/prior Treatment) Patient is a 70 y.o. male presenting with altered mental status. The history is provided by the patient.  Altered Mental Status Presenting symptoms: no confusion   Associated symptoms: no abdominal pain, no fever, no headaches, no rash and no vomiting   Patient w hx etoh use/abuse, htn, presents w fall a few days ago.  Pt indicates tripped, fell, hitting left ribs.  States has persistent pain to area since, constant, dull, worse w movement, sneezing, cough or palpation. Notes increased cough in past few days. Non productive. No sore throat or runny nose. No fever or chills. No exertional cp or discomfort. No pleuritic cp or leg pain/swelling. Pt notes recent increased falls. States hits head, denies loc. No neck or back pain. Pt is very challenging/difficult historian.       Past Medical History  Diagnosis Date  . Prostate cancer ([place]) 2010    External beam radiation (urol - Risa Grill, XRT Valere Dross)  . Stroke ([place])   . Depression   . Tachycardia   . Chronic kidney disease   . Hypertension   . Insomnia    Past Surgical History  Procedure Laterality Date  . None     Family History  Problem Relation Age of Onset  . Cancer Mother     esophageal  . Hypertension Mother   . Diabetes Mother   . Cancer Father     prostate  . Heart disease Maternal Grandfather     MI  . Cancer Paternal Grandfather     prostate   Social History  Substance Use Topics  . Smoking status: Current Every Day Smoker -- 0.50 packs/day for 30 years    Types: Cigarettes  . Smokeless tobacco: Never Used  . Alcohol Use: 4.2 oz/week    7 Shots of liquor per week     Comment: last drink yesterday    Review of Systems   Constitutional: Negative for fever and chills.  HENT: Negative for sore throat.   Eyes: Negative for visual disturbance.  Respiratory: Positive for cough. Negative for shortness of breath.   Cardiovascular: Negative for chest pain.  Gastrointestinal: Negative for vomiting, abdominal pain and diarrhea.  Genitourinary: Negative for dysuria and flank pain.  Musculoskeletal: Negative for back pain and neck pain.  Skin: Negative for rash.  Neurological: Negative for numbness and headaches.  Hematological: Does not bruise/bleed easily.  Psychiatric/Behavioral: Negative for confusion.      Allergies  Celebrex; Cymbalta; Lexapro; Trazodone and nefazodone; and Wellbutrin  Home Medications   Prior to Admission medications   Medication Sig Start Date End Date Taking? Authorizing Provider  aspirin 81 MG EC tablet Take 1 tablet (81 mg total) by mouth daily. 07/11/14   Lavon Paganini Angiulli, PA-C  atorvastatin (LIPITOR) 10 MG tablet Take 1 tablet (10 mg total) by mouth daily. 07/11/14   Lavon Paganini Angiulli, PA-C  carvedilol (COREG) 12.5 MG tablet Take 12.5 mg by mouth 2 (two) times daily. 01/19/15   Historical Provider, MD  chlordiazePOXIDE (LIBRIUM) 10 MG capsule Take 1 capsule (10 mg total) by mouth at bedtime. 04/08/15   Thurnell Lose, MD  CIALIS 20 MG tablet Take 20 mg by mouth as needed for erectile dysfunction.  12/31/14  Historical Provider, MD  folic acid (FOLVITE) 1 MG tablet Take 1 tablet (1 mg total) by mouth daily. 07/11/14   Lavon Paganini Angiulli, PA-C  LORazepam (ATIVAN) 1 MG tablet Take 1 tablet (1 mg total) by mouth 3 (three) times daily. 04/08/15   Thurnell Lose, MD  potassium chloride SA (K-DUR,KLOR-CON) 20 MEQ tablet Take 1 tablet (20 mEq total) by mouth daily. 12/11/14   Ernestina Patches, MD  tamsulosin (FLOMAX) 0.4 MG CAPS capsule Take 0.4 mg by mouth daily.  11/25/14   Historical Provider, MD  triamterene-hydrochlorothiazide (DYAZIDE) 37.5-25 MG per capsule Take 1 capsule by mouth daily.  01/30/15   Historical Provider, MD   BP 129/90 mmHg  Pulse 88  Temp(Src) 98.2 F (36.8 C) (Oral)  Resp 15  SpO2 100% Physical Exam  Constitutional: He is oriented to person, place, and time. He appears well-developed and well-nourished. No distress.  HENT:  Head: Atraumatic.  Mouth/Throat: Oropharynx is clear and moist.  Eyes: Conjunctivae are normal. Pupils are equal, round, and reactive to light. No scleral icterus.  Neck: Neck supple. No tracheal deviation present.  Cardiovascular: Normal rate, regular rhythm, normal heart sounds and intact distal pulses.  Exam reveals no gallop and no friction rub.   No murmur heard. Pulmonary/Chest: Effort normal and breath sounds normal. No accessory muscle usage. No respiratory distress. He exhibits tenderness.  Abdominal: Soft. Bowel sounds are normal. He exhibits no distension and no mass. There is no tenderness. There is no rebound and no guarding.  Genitourinary:  No cva tenderness  Musculoskeletal: Normal range of motion.  CTLS spine, non tender, aligned, no step off. Good rom bil ext without pain or focal bony tenderness.   Neurological: He is alert and oriented to person, place, and time.  HOH (at baseline, per pt). Moves bil ext purposefully w good strength. sens intact.   Skin: Skin is warm and dry. No rash noted. He is not diaphoretic.  Psychiatric: He has a normal mood and affect.  Nursing note and vitals reviewed.   ED Course  Procedures (including critical care time) Labs Review  Results for orders placed or performed during the hospital encounter of 01/10/16  CBC  Result Value Ref Range   WBC 7.5 4.0 - 10.5 K/uL   RBC 3.84 (L) 4.22 - 5.81 MIL/uL   Hemoglobin 13.9 13.0 - 17.0 g/dL   HCT 39.9 39.0 - 52.0 %   MCV 103.9 (H) 78.0 - 100.0 fL   MCH 36.2 (H) 26.0 - 34.0 pg   MCHC 34.8 30.0 - 36.0 g/dL   RDW 13.4 11.5 - 15.5 %   Platelets 193 150 - 400 K/uL  Comprehensive metabolic panel  Result Value Ref Range   Sodium 140  135 - 145 mmol/L   Potassium 4.1 3.5 - 5.1 mmol/L   Chloride 104 101 - 111 mmol/L   CO2 25 22 - 32 mmol/L   Glucose, Bld 101 (H) 65 - 99 mg/dL   BUN 10 6 - 20 mg/dL   Creatinine, Ser 0.98 0.61 - 1.24 mg/dL   Calcium 9.0 8.9 - 10.3 mg/dL   Total Protein 6.1 (L) 6.5 - 8.1 g/dL   Albumin 3.3 (L) 3.5 - 5.0 g/dL   AST 24 15 - 41 U/L   ALT 22 17 - 63 U/L   Alkaline Phosphatase 57 38 - 126 U/L   Total Bilirubin 0.7 0.3 - 1.2 mg/dL   GFR calc non Af Amer >60 >60 mL/min   GFR calc Af  Amer >60 >60 mL/min   Anion gap 11 5 - 15  Troponin I  Result Value Ref Range   Troponin I 4.59 (HH) <0.031 ng/mL  Ethanol  Result Value Ref Range   Alcohol, Ethyl (B) 210 (H) <5 mg/dL   Dg Chest 2 View  01/10/2016  CLINICAL DATA:  Left lower rib pain.  Fell on Wednesday. EXAM: CHEST  2 VIEW COMPARISON:  04/02/2015 FINDINGS: The heart is enlarged but stable. There is tortuosity of the thoracic aorta. Patchy bibasilar atelectasis or infiltrates. No effusions or edema. Possible nondisplaced lower left rib fractures. No pneumothorax or obvious pleural effusion. IMPRESSION: Cardiac enlargement and bibasilar infiltrates or atelectasis. Possible lower left rib fractures. Electronically Signed   By: Marijo Sanes M.D.   On: 01/10/2016 18:33   Ct Head Wo Contrast  01/10/2016  CLINICAL DATA:  Multiple falls.  Alcohol abuse. EXAM: CT HEAD WITHOUT CONTRAST TECHNIQUE: Contiguous axial images were obtained from the base of the skull through the vertex without intravenous contrast. COMPARISON:  04/02/2015 FINDINGS: There is no evidence of intracranial hemorrhage, brain edema, or other signs of acute infarction. There is no evidence of intracranial mass lesion or mass effect. No abnormal extraaxial fluid collections are identified. Mild cerebral atrophy and chronic small vessel disease is stable in appearance. Ventricles are stable in size. No evidence of skull fracture. Chronic bilateral maxillary sinusitis again noted. IMPRESSION:  No acute intracranial abnormality. Stable mild cerebral atrophy and chronic small vessel disease. Chronic bilateral maxillary sinusitis. Electronically Signed   By: Earle Gell M.D.   On: 01/10/2016 18:23      I have personally reviewed and evaluated these images and lab results as part of my medical decision-making.   EKG Interpretation   Date/Time:  Sunday January 10 2016 17:11:01 EST Ventricular Rate:  88 PR Interval:  155 QRS Duration: 88 QT Interval:  355 QTC Calculation: 429 R Axis:   90 Text Interpretation:  Sinus rhythm Borderline right axis deviation  Baseline wander in lead(s) V1 Confirmed by Ashok Cordia  MD, Lennette Bihari ([id number]) on  01/10/2016 5:17:21 PM      MDM   Iv ns. Ecg. Labs.  Imaging.  Reviewed nursing notes and prior charts for additional history.   Troponin returns very high.    Pt indicates left lower chest pain, where struck chest w fall, for past few days. Pt unable/does not describe other/exertional cp. Chest wall is tender. ecg without acute st/t changes.  Given high trop, will consult cardiology.   Discussed pt with Dr Wynonia Lawman, on call for cardiology - he indicates they will consult on pt, as relates markedly elevated trop 4.5, and that given other medical issues, etoh intoxic, etc, requests hospitalist service admit patient.  Hospitalists paged for admission.     Lajean Saver, MD 01/10/16 2013

## 2016-01-10 NOTE — Progress Notes (Signed)
PHARMACIST - PHYSICIAN ORDER COMMUNICATION  CONCERNING: P&T Medication Policy on Herbal Medications  DESCRIPTION:  This patient's order for:  Co-enzyme Q-10  has been noted.  This product(s) is classified as an "herbal" or natural product. Due to a lack of definitive safety studies or FDA approval, nonstandard manufacturing practices, plus the potential risk of unknown drug-drug interactions while on inpatient medications, the Pharmacy and Therapeutics Committee does not permit the use of "herbal" or natural products of this type within [facility].   ACTION TAKEN: The pharmacy department is unable to verify this order at this time and your patient has been informed of this safety policy. Please reevaluate patient's clinical condition at discharge and address if the herbal or natural product(s) should be resumed at that time.   Alycia Rossetti, PharmD, BCPS Clinical Pharmacist Pager: [phone] 01/10/2016 10:12 PM

## 2016-01-10 NOTE — Consult Note (Addendum)
Cardiology Consult Note  Admit date: 01/10/2016 Name: Joshua Henson 70 y.o.  male DOB:  [birth year] MRN:  [id number]  Today's date:  01/10/2016  Referring Physician:    [facility]  Primary Physician:    Dr. Mayra Neer  Reason for Consultation:    Abnormal troponin, chest pain  IMPRESSIONS: 1.  Abnormal troponin may be cocaine induced myocardial ischemia versus non-STEMI but clinical story is not consistent with a clinical STEMI but difficult to assess in the setting of acute alcoholic intoxication.   2.  Positive drug screen for cocaine 3.  Alcoholism with acute alcoholic intoxication and withdrawal 4.  Coronary artery disease as manifest by CT scan with calcification previously 5.  Depression 6.  Fatty liver 7.  Hypertension 8.  Mnire's disease with hearing loss 9.  Recent fall with rib fracture  RECOMMENDATION: Cycle serial troponins.  He is acutely intoxicated and will need to be screened for alcohol withdrawal.  Check echocardiogram and repeat EKG in the morning.  Once he is over his alcohol withdrawal and substance abuse withdrawal likely will require catheterization to assess the extent of his coronary artery disease in light of the elevated troponins.  If troponins rise may consider heparinization but need to be careful in light of the recent falls.  In addition would avoid the use of beta blockers in the setting of recent cocaine use for the time being.  HISTORY: This 70 year old male is known to me for many years because her daughters were in school together.  Medically he has used alcohol for a long time and at that time he was divorced several years ago began to drink more heavily.  His more recent history is remarkable for admissions related to alcohol intoxication.  He has also had significant difficulty with hearing loss as well as Mnire's disease.  During a previous admission he was found to have three-vessel coronary calcification on the CT scan.  He  also had a small punctate frontal lobe infarct noted on a MRI and had some carotid calcification noted but did not have significant carotid artery disease on a head CTA.  He has had frequent episodes of falling and has been an alcohol detox program.  He has been drinking heavily and fell with rib fractures and came to the emergency room tonight because he was confused and talking out of his head in complaining of left chest pain.  The pain was reproducible at the site where he had rib fractures.  Because of the chest pain and a troponin was drawn and was returned at 4.59.  The patient currently denies any pressure type pain suggestive of myocardial ischemia and denies previous exertional type chest and all of the history is somewhat suspect.  When the drug screen returned positive for cocaine he admitted that he used it a week ago and then stated that he may have used some even as late as Saturday night.  Normally denies PND, orthopnea or edema.  Past Medical History  Diagnosis Date  . Prostate cancer ([place]) 2010    External beam radiation (urol - Risa Grill, XRT Valere Dross)  . Stroke ([place])   . Depression   . Tachycardia   . Hypertension   . Insomnia   . CAD (coronary artery disease), native coronary artery 01/10/2016    3 vessel coronary calcification noted on CT scan 04/03/15    . Fatty liver, alcoholic [id number]     Past Surgical History  Procedure Laterality Date  .  None      Allergies:  is allergic to celebrex; cymbalta; lexapro; trazodone and nefazodone; and wellbutrin.   Medications: Prior to Admission medications   Medication Sig Start Date End Date Taking? Authorizing Provider  ALPRAZolam Duanne Moron) 0.5 MG tablet Take 0.5-0.75 mg by mouth 2 (two) times daily as needed for anxiety or sleep.  12/14/15  Yes Historical Provider, MD  aspirin 81 MG EC tablet Take 1 tablet (81 mg total) by mouth daily. 07/11/14  Yes Daniel J Angiulli, PA-C  buPROPion (WELLBUTRIN) 100 MG tablet Take 200 mg by mouth  daily. 12/14/15  Yes Historical Provider, MD  carvedilol (COREG) 3.125 MG tablet 3.125 mg. 01/01/16  Yes Historical Provider, MD  cholecalciferol (VITAMIN D) 1000 units tablet Take 2,000 Units by mouth daily.   Yes Historical Provider, MD  citalopram (CELEXA) 20 MG tablet Take 20 mg by mouth at bedtime. 12/20/15  Yes Historical Provider, MD  Coenzyme Q10 (CO Q-10) 200 MG CAPS Take 200 mg by mouth daily.   Yes Historical Provider, MD  folic acid (FOLVITE) 1 MG tablet Take 1 tablet (1 mg total) by mouth daily. 07/11/14  Yes Daniel J Angiulli, PA-C  HYDROcodone-acetaminophen (NORCO/VICODIN) 5-325 MG tablet Take 1 tablet by mouth every 6 (six) hours as needed for moderate pain.  01/06/16  Yes Historical Provider, MD  LORazepam (ATIVAN) 1 MG tablet Take 1 tablet (1 mg total) by mouth 3 (three) times daily. 04/08/15  Yes Thurnell Lose, MD  Magnesium 500 MG CAPS Take 500 mg by mouth at bedtime.   Yes Historical Provider, MD  Omega-3 Fatty Acids (FISH OIL) 1000 MG CAPS Take by mouth 2 (two) times daily.   Yes Historical Provider, MD  potassium chloride SA (K-DUR,KLOR-CON) 20 MEQ tablet Take 1 tablet (20 mEq total) by mouth daily. 12/11/14  Yes Ernestina Patches, MD  tamsulosin (FLOMAX) 0.4 MG CAPS capsule Take 0.4 mg by mouth daily.  11/25/14  Yes Historical Provider, MD  thiamine (VITAMIN B-1) 100 MG tablet Take 100 mg by mouth daily.   Yes Historical Provider, MD  triamterene-hydrochlorothiazide (DYAZIDE) 37.5-25 MG per capsule Take 1 capsule by mouth daily. 01/30/15  Yes Historical Provider, MD  chlordiazePOXIDE (LIBRIUM) 10 MG capsule Take 1 capsule (10 mg total) by mouth at bedtime. Patient not taking: Reported on 01/10/2016 04/08/15   Thurnell Lose, MD  CIALIS 20 MG tablet Take 20 mg by mouth as needed for erectile dysfunction.  12/31/14   Historical Provider, MD   Family History: Family Status  Relation Status Death Age  . Mother Deceased 36    esophagus cancer  . Father Deceased 10    prostate cancer   . Sister Alive    Social History:   reports that he has been smoking Cigarettes.  He has a 15 pack-year smoking history. He has never used smokeless tobacco. He reports that he drinks about 4.2 oz of alcohol per week.  Intermittent use of cocaine.  Social History   Social History Narrative   HSG, [employer], [employer]. Married 22 yrs yrs - divorced. Twin boys - 79; 1 dtr - '94. Pension scheme manager.    Review of Systems: Significant hearing loss history of vertigo and Mnire's.  Significant history of depression he has orthostatic hypotension and has been drinking heavily.  He has been confused today.  Normally no ischemic type chest pain.  Significant insomnia.  Significant recent falls.  Erectile dysfunction, nocturia, urgency.  History of some mild GI bleeding.  Other than as noted above remainder of the review of systems is unremarkable  Physical Exam: BP 166/82 mmHg  Pulse 105  Temp(Src) 98.2 F (36.8 C) (Oral)  Resp 20  SpO2 93%  General appearance: Tremulous, talkative white male who is a rambling historian Head: Normocephalic, without obvious abnormality, atraumatic, Balding male hair pattern Ears: Hearing loss Neck: no adenopathy, no carotid bruit, no JVD and supple, symmetrical, trachea midline Lungs: clear to auscultation bilaterally Heart: regular rate and rhythm, S1, S2 normal, no murmur, click, rub or gallop Abdomen: soft, non-tender; bowel sounds normal; no masses,  no organomegaly Rectal: deferred Extremities: Bruises and ecchymosis present over knees and lower extremities Pulses: 2+ and symmetric Neurologic: He is significantly tremulous and is actively moving, no focal neurologic deficits.  Labs: CBC  Recent Labs  01/10/16 1712  WBC 7.5  RBC 3.84*  HGB 13.9  HCT 39.9  PLT 193  MCV 103.9*  MCH 36.2*  MCHC 34.8  RDW 13.4   CMP   Recent Labs  01/10/16 1712  NA 140  K 4.1  CL 104  CO2 25  GLUCOSE 101*  BUN 10  CREATININE 0.98   CALCIUM 9.0  PROT 6.1*  ALBUMIN 3.3*  AST 24  ALT 22  ALKPHOS 57  BILITOT 0.7  GFRNONAA >60  GFRAA >60   Troponin (Point of Care Test) Cardiac Panel (last 3 results)  Recent Labs  01/10/16 1712  CKTOTAL 47*  CKMB 1.3  TROPONINI 4.59*  RELINDX RELATIVE INDEX IS INVALID     Radiology: Cardiac enlargement, old rib fractures  EKG: Sinus rhythm no acute abnormality  Signed:  W. Doristine Church MD [facility]   Cardiology Consultant  01/10/2016, 8:38 PM

## 2016-01-10 NOTE — ED Notes (Signed)
MD at bedside. 

## 2016-01-10 NOTE — ED Notes (Signed)
Per EMS- pt has been drinking more alcohol than normal. Pt was recently seen by ortho for a rib fracture with possible spleen injury. Pt is alert x3. Pt was positive orthostatic. Pt has had multiple falls recently. NSR with EMS. Pt received 678ml NS. No neuro deficits with EMS.

## 2016-01-10 NOTE — H&P (Addendum)
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 Patient is somewhat of a poor historian and his live-in  girlfriend provides much of his history. Patient in the recent weeks has been drinking more. A couple days ago when she has come home from work she notes that he fell on the [place] and table and broke and it. She notes that he has a previous history of seizures.  Patient had reported left-sided chest pain and he had bruises of his knees. She notes last few weeks that she's noticed a rapid loss of his muscle mass in his lower extremities. Patient notes that he has left-sided chest pain more so than the right. Pain is sharp in nature. Worse with deep inspiration or trying to sit up or changes positions. Relieved by staying still and pain medication. His girlfriend also noticed that he's been more confused with memory loss in the last 2 days. He actually called his daughter today and was talking so confused that she called the patient's girlfriend to see if everything was all right. Review of patient's record shows that he's had multiple previous admissions for alcoholism suffering previous alcohol withdrawal seizures.  Upon arrival to the emergency department patient is evaluated with a CT scan of the head which shows no acute abnormalities. Patient was found to have relatively normal blood work except for Alcohol level 210, elevated troponin of 4.59, but normal EKG. Cardiology was consulted and evaluated the patient and recommended inpatient admission trending cardiac enzymes and check CK level.     Review of Systems   Constitutional: Positive for malaise/fatigue. Negative for fever and chills.  HENT: Positive for hearing loss. Negative for ear pain.   Eyes: Negative for double vision and photophobia.  Respiratory: Negative for cough and shortness of breath.   Cardiovascular: Positive for chest pain. Negative for leg swelling.  Gastrointestinal: Negative for nausea and vomiting.  Genitourinary: Negative for urgency and frequency.  Musculoskeletal: Positive for joint pain and falls.  Skin: Negative for itching and rash.  Neurological: Positive for dizziness, tremors and weakness.  Endo/Heme/Allergies: Negative for environmental allergies. Does not bruise/bleed easily.  Psychiatric/Behavioral: Positive for memory loss. The patient is nervous/anxious.       Past Medical History  Diagnosis Date  . Prostate cancer ([place]) 2010    External beam radiation (urol - Risa Grill, XRT Valere Dross)  . Stroke ([place])   . Depression   . Tachycardia   . Chronic kidney disease   . Hypertension   . Insomnia      Past Surgical History  Procedure Laterality Date  . None        Social History:  reports that he has been smoking Cigarettes.  He has a 15 pack-year smoking history. He has never used smokeless tobacco. He reports that he drinks about 4.2 oz of alcohol per week. He reports that he does not use illicit drugs. Where does patient live--home  and with whom if at home? With girlfriend Can patient participate in ADLs? Yes  Allergies  Allergen Reactions  . Celebrex [Celecoxib] Rash  . Cymbalta [Duloxetine Hcl] Other (See Comments)    Did not  work   . Lexapro [Escitalopram] Other (See Comments)    Did not work   . Trazodone And Nefazodone Other (See Comments)    Did not work   . Wellbutrin [Bupropion] Other (See Comments)    Did not work     Family History  Problem Relation Age of Onset  . Cancer Mother     esophageal  . Hypertension Mother   . Diabetes Mother   . Cancer Father     prostate  .  Heart disease Maternal Grandfather     MI  . Cancer Paternal Grandfather     prostate       Prior to Admission medications   Medication Sig Start Date End Date Taking? Authorizing Provider  ALPRAZolam Duanne Moron) 0.5 MG tablet Take 0.5-0.75 mg by mouth 2 (two) times daily as needed for anxiety or sleep.  12/14/15  Yes Historical Provider, MD  aspirin 81 MG EC tablet Take 1 tablet (81 mg total) by mouth daily. 07/11/14  Yes Daniel J Angiulli, PA-C  buPROPion (WELLBUTRIN) 100 MG tablet Take 200 mg by mouth daily. 12/14/15  Yes Historical Provider, MD  carvedilol (COREG) 3.125 MG tablet 3.125 mg. 01/01/16  Yes Historical Provider, MD  cholecalciferol (VITAMIN D) 1000 units tablet Take 2,000 Units by mouth daily.   Yes Historical Provider, MD  citalopram (CELEXA) 20 MG tablet Take 20 mg by mouth at bedtime. 12/20/15  Yes Historical Provider, MD  Coenzyme Q10 (CO Q-10) 200 MG CAPS Take 200 mg by mouth daily.   Yes Historical Provider, MD  folic acid (FOLVITE) 1 MG tablet Take 1 tablet (1 mg total) by mouth daily. 07/11/14  Yes Daniel J Angiulli, PA-C  HYDROcodone-acetaminophen (NORCO/VICODIN) 5-325 MG tablet Take 1 tablet by mouth every 6 (six) hours as needed for moderate pain.  01/06/16  Yes Historical Provider, MD  LORazepam (ATIVAN) 1 MG tablet Take 1 tablet (1 mg total) by mouth 3 (three) times daily. 04/08/15  Yes Thurnell Lose, MD  Magnesium 500 MG CAPS Take 500 mg by mouth at bedtime.   Yes Historical Provider, MD  Omega-3 Fatty Acids (FISH OIL) 1000 MG CAPS Take by mouth 2 (two) times daily.   Yes Historical Provider, MD  potassium chloride SA (K-DUR,KLOR-CON) 20 MEQ tablet Take 1 tablet (20 mEq total) by mouth daily. 12/11/14  Yes Ernestina Patches, MD  tamsulosin (FLOMAX) 0.4 MG CAPS capsule Take 0.4 mg by mouth daily.  11/25/14  Yes Historical Provider, MD  thiamine (VITAMIN B-1) 100 MG tablet Take 100 mg by mouth daily.   Yes Historical Provider, MD  triamterene-hydrochlorothiazide (DYAZIDE)  37.5-25 MG per capsule Take 1 capsule by mouth daily. 01/30/15  Yes Historical Provider, MD  chlordiazePOXIDE (LIBRIUM) 10 MG capsule Take 1 capsule (10 mg total) by mouth at bedtime. Patient not taking: Reported on 01/10/2016 04/08/15   Thurnell Lose, MD  CIALIS 20 MG tablet Take 20 mg by mouth as needed for erectile dysfunction.  12/31/14   Historical Provider, MD     Physical Exam: Filed Vitals:   01/10/16 1715 01/10/16 1745 01/10/16 1830 01/10/16 1917  BP: 119/74 111/75 128/80 149/87  Pulse: 85 86 99 85  Temp:      TempSrc:      Resp: 17 16 15 18   SpO2: 94% 96% 90% 95%     Constitutional: Vital signs reviewed. Patient is disheveled and appears somewhat older than said age.  Head: Normocephalic and atraumatic  Ear: TM normal bilaterally  Mouth: no  erythema or exudates, dry mucous membranes Ears: Bilateral hearing aids Eyes: PERRL, EOMI, conjunctivae normal, No scleral icterus.  Neck: Supple, Trachea midline normal ROM, No JVD, mass, thyromegaly, or carotid bruit present.  Cardiovascular: RRR, S1 normal, S2 normal, no MRG, pulses symmetric and intact bilaterally  Pulmonary/Chest: CTAB, no wheezes, rales, or rhonchi. Tenderness to palpation along the lateral aspect left chest wall underneath the nipple. Abdominal: Soft. Non-tender, non-distended, bowel sounds are normal, no masses, organomegaly, or guarding present.  GU: no CVA tenderness Musculoskeletal:  Normal range of motion of the spine with no tenderness to palpation noted. Patient has visible muscle atrophy and wasting of the bilateral lower extremities.  Ext: no edema and no cyanosis, pulses palpable bilaterally (DP and PT)  Hematology: no cervical, inginal, or axillary adenopathy.  Neurological: Patient is hyperactive and tremulous patient move all extremities. Skin: Spider hemangiomas present of the upper neck, abrasions and bruises to the bilateral knees  Psychiatric: Patient appears very anxious.  Data Review   Micro  Results No results found for this or any previous visit (from the past 240 hour(s)).  Radiology Reports Dg Chest 2 View  01/10/2016  CLINICAL DATA:  Left lower rib pain.  Fell on Wednesday. EXAM: CHEST  2 VIEW COMPARISON:  04/02/2015 FINDINGS: The heart is enlarged but stable. There is tortuosity of the thoracic aorta. Patchy bibasilar atelectasis or infiltrates. No effusions or edema. Possible nondisplaced lower left rib fractures. No pneumothorax or obvious pleural effusion. IMPRESSION: Cardiac enlargement and bibasilar infiltrates or atelectasis. Possible lower left rib fractures. Electronically Signed   By: Marijo Sanes M.D.   On: 01/10/2016 18:33   Ct Head Wo Contrast  01/10/2016  CLINICAL DATA:  Multiple falls.  Alcohol abuse. EXAM: CT HEAD WITHOUT CONTRAST TECHNIQUE: Contiguous axial images were obtained from the base of the skull through the vertex without intravenous contrast. COMPARISON:  04/02/2015 FINDINGS: There is no evidence of intracranial hemorrhage, brain edema, or other signs of acute infarction. There is no evidence of intracranial mass lesion or mass effect. No abnormal extraaxial fluid collections are identified. Mild cerebral atrophy and chronic small vessel disease is stable in appearance. Ventricles are stable in size. No evidence of skull fracture. Chronic bilateral maxillary sinusitis again noted. IMPRESSION: No acute intracranial abnormality. Stable mild cerebral atrophy and chronic small vessel disease. Chronic bilateral maxillary sinusitis. Electronically Signed   By: Earle Gell M.D.   On: 01/10/2016 18:23     CBC  Recent Labs Lab 01/10/16 1712  WBC 7.5  HGB 13.9  HCT 39.9  PLT 193  MCV 103.9*  MCH 36.2*  MCHC 34.8  RDW 13.4    Chemistries   Recent Labs Lab 01/10/16 1712  NA 140  K 4.1  CL 104  CO2 25  GLUCOSE 101*  BUN 10  CREATININE 0.98  CALCIUM 9.0  AST 24  ALT 22  ALKPHOS 57  BILITOT 0.7    ------------------------------------------------------------------------------------------------------------------ CrCl cannot be calculated (Unknown ideal weight.). ------------------------------------------------------------------------------------------------------------------ No results for input(s): HGBA1C in the last 72 hours. ------------------------------------------------------------------------------------------------------------------ No results for input(s): CHOL, HDL, LDLCALC, TRIG, CHOLHDL, LDLDIRECT in the last 72 hours. ------------------------------------------------------------------------------------------------------------------ No results for input(s): TSH, T4TOTAL, T3FREE, THYROIDAB in the last 72 hours.  Invalid input(s): FREET3 ------------------------------------------------------------------------------------------------------------------ No results for input(s): VITAMINB12, FOLATE, FERRITIN, TIBC, IRON, RETICCTPCT in the last 72 hours.  Coagulation profile No results for input(s): INR, PROTIME in the last 168 hours.  No results for input(s): DDIMER in the last 72 hours.  Cardiac Enzymes  Recent Labs Lab 01/10/16 1712  TROPONINI 4.59*   ------------------------------------------------------------------------------------------------------------------ Invalid input(s): POCBNP   CBG: No results for input(s): GLUCAP in the last 168 hours.     EKG: Independently reviewed. Sinus  rhythm   Assessment/Plan Chest pain: initially thought to be musculoskeletal in nature as it was reproducible physical exam and the patient was found to have a possible left rib fracture on chest x-ray. - Admit to telemetry bed -  Hydrocodone/ morphine prn pain  Elevated troponin: Acute. Patient found to have elevated troponin of 4.59 on admission. Evaluated by cardiology who recommended checking a CPK. Addendum Patient found Positive for cocaine on UDS. - Trending cardiac  enzymes -  Repeat EKG 12 seen in a.m. - check Echo in am - Cardiology consulted and will see patient in a.m.   Alcohol abuse:  Patient was found to be acutely intoxicated on admission alcohol level in 200's.  Patient with a history of withdrawals. - CWIAA protocol -  Bananna Bag at 130ml/hour - seizure precautions - social work consult for alcohol and cociane abuse  Falls:  Patient with history of increased on lower extremity weakness could be from vitamin B12 deficiency with his alcoholism. - Physical therapy to telemetry    Acute encephalopathy:  Likely secondary to  patient's polysubstance abuse -  Continue to monitor   Suspected B12 or folate deficiency : Patient with elevated MCV and MCH  - Check folate and B12 in a.m.   Anxiety/depression history -  Discontinued Wellbutrin for now with patient's history of alcohol and risk  of lowering seizure threshold -  Continue citalopram  Code Status:   full Family Communication: bedside Disposition Plan: admit   : Total tim:e spent 55 minutes.Greater than 50% of this time was spent in counseling, explanation of diagnosis, planning of further management, and coordination of care  [facility] Pager [phone]  If 7PM-7AM, please contact night-coverage [email or web address] Password [facility] 01/10/2016, 7:25 PM

## 2016-01-10 NOTE — ED Notes (Signed)
1st attempt to call report 

## 2016-01-11 ENCOUNTER — Inpatient Hospital Stay (HOSPITAL_COMMUNITY): Payer: Medicare Other

## 2016-01-11 ENCOUNTER — Observation Stay (HOSPITAL_COMMUNITY): Payer: Medicare Other

## 2016-01-11 DIAGNOSIS — Z7982 Long term (current) use of aspirin: Secondary | ICD-10-CM | POA: Diagnosis not present

## 2016-01-11 DIAGNOSIS — F10129 Alcohol abuse with intoxication, unspecified: Secondary | ICD-10-CM | POA: Diagnosis present

## 2016-01-11 DIAGNOSIS — A4901 Methicillin susceptible Staphylococcus aureus infection, unspecified site: Secondary | ICD-10-CM | POA: Diagnosis present

## 2016-01-11 DIAGNOSIS — N179 Acute kidney failure, unspecified: Secondary | ICD-10-CM

## 2016-01-11 DIAGNOSIS — J9601 Acute respiratory failure with hypoxia: Secondary | ICD-10-CM | POA: Diagnosis present

## 2016-01-11 DIAGNOSIS — K760 Fatty (change of) liver, not elsewhere classified: Secondary | ICD-10-CM | POA: Diagnosis present

## 2016-01-11 DIAGNOSIS — F10931 Alcohol use, unspecified with withdrawal delirium: Secondary | ICD-10-CM | POA: Diagnosis not present

## 2016-01-11 DIAGNOSIS — N189 Chronic kidney disease, unspecified: Secondary | ICD-10-CM | POA: Diagnosis present

## 2016-01-11 DIAGNOSIS — G9340 Encephalopathy, unspecified: Secondary | ICD-10-CM | POA: Diagnosis not present

## 2016-01-11 DIAGNOSIS — I214 Non-ST elevation (NSTEMI) myocardial infarction: Secondary | ICD-10-CM | POA: Diagnosis not present

## 2016-01-11 DIAGNOSIS — F17210 Nicotine dependence, cigarettes, uncomplicated: Secondary | ICD-10-CM | POA: Diagnosis present

## 2016-01-11 DIAGNOSIS — J9811 Atelectasis: Secondary | ICD-10-CM | POA: Diagnosis not present

## 2016-01-11 DIAGNOSIS — F10231 Alcohol dependence with withdrawal delirium: Secondary | ICD-10-CM | POA: Diagnosis present

## 2016-01-11 DIAGNOSIS — F329 Major depressive disorder, single episode, unspecified: Secondary | ICD-10-CM | POA: Diagnosis present

## 2016-01-11 DIAGNOSIS — Z8546 Personal history of malignant neoplasm of prostate: Secondary | ICD-10-CM | POA: Diagnosis not present

## 2016-01-11 DIAGNOSIS — S2232XA Fracture of one rib, left side, initial encounter for closed fracture: Secondary | ICD-10-CM | POA: Diagnosis present

## 2016-01-11 DIAGNOSIS — F10929 Alcohol use, unspecified with intoxication, unspecified: Secondary | ICD-10-CM | POA: Insufficient documentation

## 2016-01-11 DIAGNOSIS — S2232XS Fracture of one rib, left side, sequela: Secondary | ICD-10-CM

## 2016-01-11 DIAGNOSIS — F419 Anxiety disorder, unspecified: Secondary | ICD-10-CM | POA: Diagnosis present

## 2016-01-11 DIAGNOSIS — T405X4A Poisoning by cocaine, undetermined, initial encounter: Secondary | ICD-10-CM | POA: Diagnosis present

## 2016-01-11 DIAGNOSIS — I426 Alcoholic cardiomyopathy: Secondary | ICD-10-CM | POA: Diagnosis present

## 2016-01-11 DIAGNOSIS — G9341 Metabolic encephalopathy: Secondary | ICD-10-CM

## 2016-01-11 DIAGNOSIS — R296 Repeated falls: Secondary | ICD-10-CM

## 2016-01-11 DIAGNOSIS — Z4682 Encounter for fitting and adjustment of non-vascular catheter: Secondary | ICD-10-CM | POA: Diagnosis not present

## 2016-01-11 DIAGNOSIS — H9190 Unspecified hearing loss, unspecified ear: Secondary | ICD-10-CM | POA: Diagnosis present

## 2016-01-11 DIAGNOSIS — E876 Hypokalemia: Secondary | ICD-10-CM | POA: Diagnosis present

## 2016-01-11 DIAGNOSIS — F1910 Other psychoactive substance abuse, uncomplicated: Secondary | ICD-10-CM | POA: Diagnosis not present

## 2016-01-11 DIAGNOSIS — Z923 Personal history of irradiation: Secondary | ICD-10-CM | POA: Diagnosis not present

## 2016-01-11 DIAGNOSIS — F1010 Alcohol abuse, uncomplicated: Secondary | ICD-10-CM | POA: Insufficient documentation

## 2016-01-11 DIAGNOSIS — S2239XA Fracture of one rib, unspecified side, initial encounter for closed fracture: Secondary | ICD-10-CM | POA: Diagnosis not present

## 2016-01-11 DIAGNOSIS — Z79899 Other long term (current) drug therapy: Secondary | ICD-10-CM | POA: Diagnosis not present

## 2016-01-11 DIAGNOSIS — H8109 Meniere's disease, unspecified ear: Secondary | ICD-10-CM | POA: Diagnosis present

## 2016-01-11 DIAGNOSIS — Y907 Blood alcohol level of 200-239 mg/100 ml: Secondary | ICD-10-CM | POA: Diagnosis present

## 2016-01-11 DIAGNOSIS — I509 Heart failure, unspecified: Secondary | ICD-10-CM | POA: Diagnosis present

## 2016-01-11 DIAGNOSIS — R6520 Severe sepsis without septic shock: Secondary | ICD-10-CM

## 2016-01-11 DIAGNOSIS — W19XXXA Unspecified fall, initial encounter: Secondary | ICD-10-CM | POA: Diagnosis present

## 2016-01-11 DIAGNOSIS — Z8673 Personal history of transient ischemic attack (TIA), and cerebral infarction without residual deficits: Secondary | ICD-10-CM | POA: Diagnosis not present

## 2016-01-11 DIAGNOSIS — I119 Hypertensive heart disease without heart failure: Secondary | ICD-10-CM

## 2016-01-11 DIAGNOSIS — I2510 Atherosclerotic heart disease of native coronary artery without angina pectoris: Secondary | ICD-10-CM | POA: Diagnosis not present

## 2016-01-11 DIAGNOSIS — R079 Chest pain, unspecified: Secondary | ICD-10-CM | POA: Diagnosis not present

## 2016-01-11 DIAGNOSIS — F1423 Cocaine dependence with withdrawal: Secondary | ICD-10-CM | POA: Diagnosis present

## 2016-01-11 DIAGNOSIS — I129 Hypertensive chronic kidney disease with stage 1 through stage 4 chronic kidney disease, or unspecified chronic kidney disease: Secondary | ICD-10-CM | POA: Diagnosis present

## 2016-01-11 DIAGNOSIS — J15211 Pneumonia due to Methicillin susceptible Staphylococcus aureus: Secondary | ICD-10-CM | POA: Diagnosis not present

## 2016-01-11 DIAGNOSIS — Z886 Allergy status to analgesic agent status: Secondary | ICD-10-CM | POA: Diagnosis not present

## 2016-01-11 DIAGNOSIS — Z888 Allergy status to other drugs, medicaments and biological substances status: Secondary | ICD-10-CM | POA: Diagnosis not present

## 2016-01-11 DIAGNOSIS — A4181 Sepsis due to Enterococcus: Secondary | ICD-10-CM | POA: Diagnosis present

## 2016-01-11 DIAGNOSIS — D7589 Other specified diseases of blood and blood-forming organs: Secondary | ICD-10-CM | POA: Diagnosis present

## 2016-01-11 DIAGNOSIS — J690 Pneumonitis due to inhalation of food and vomit: Secondary | ICD-10-CM | POA: Diagnosis not present

## 2016-01-11 LAB — TROPONIN I
TROPONIN I: 4.53 ng/mL — AB (ref <0.031)
TROPONIN I: 4.35 ng/mL — AB (ref <0.031)
TROPONIN I: 4.46 ng/mL — AB (ref <0.031)

## 2016-01-11 LAB — POCT I-STAT 3, ART BLOOD GAS (G3+)
BICARBONATE: 23.5 meq/L (ref 20.0–24.0)
O2 SAT: 100 %
PO2 ART: 177 mmHg — AB (ref 80.0–100.0)
TCO2: 24 mmol/L (ref 0–100)
pCO2 arterial: 32 mmHg — ABNORMAL LOW (ref 35.0–45.0)
pH, Arterial: 7.471 — ABNORMAL HIGH (ref 7.350–7.450)

## 2016-01-11 LAB — MRSA PCR SCREENING: MRSA by PCR: NEGATIVE

## 2016-01-11 LAB — VITAMIN B12: Vitamin B-12: 380 pg/mL (ref 180–914)

## 2016-01-11 LAB — HEPARIN LEVEL (UNFRACTIONATED): Heparin Unfractionated: <0.1 IU/mL — ABNORMAL LOW (ref 0.30–0.70)

## 2016-01-11 LAB — GLUCOSE, CAPILLARY: GLUCOSE-CAPILLARY: 137 mg/dL — AB (ref 65–99)

## 2016-01-11 LAB — PROTIME-INR
INR: 1.33 (ref 0.00–1.49)
PROTHROMBIN TIME: 16.6 s — AB (ref 11.6–15.2)

## 2016-01-11 LAB — FOLATE: Folate: 32.6 ng/mL (ref >5.9)

## 2016-01-11 LAB — CK: CK TOTAL: 82 U/L (ref 49–397)

## 2016-01-11 MED ORDER — CETYLPYRIDINIUM CHLORIDE 0.05 % MT LIQD
7.0000 mL | Freq: Two times a day (BID) | OROMUCOSAL | Status: DC
  Administered 2016-01-11 – 2016-01-12 (×2): 7 mL via OROMUCOSAL

## 2016-01-11 MED ORDER — CHLORHEXIDINE GLUCONATE 0.12% ORAL RINSE (MEDLINE KIT)
15.0000 mL | Freq: Two times a day (BID) | OROMUCOSAL | Status: DC
  Administered 2016-01-11 – 2016-01-14 (×6): 15 mL via OROMUCOSAL

## 2016-01-11 MED ORDER — FENTANYL CITRATE (PF) 100 MCG/2ML IJ SOLN
50.0000 ug | INTRAMUSCULAR | Status: DC | PRN
  Administered 2016-01-12: 50 ug via INTRAVENOUS

## 2016-01-11 MED ORDER — NICOTINE 21 MG/24HR TD PT24
21.0000 mg | MEDICATED_PATCH | Freq: Every day | TRANSDERMAL | Status: DC
  Administered 2016-01-11 – 2016-01-19 (×9): 21 mg via TRANSDERMAL
  Filled 2016-01-11 (×10): qty 1

## 2016-01-11 MED ORDER — SODIUM CHLORIDE 0.9 % IV SOLN
3.0000 g | Freq: Four times a day (QID) | INTRAVENOUS | Status: DC
  Administered 2016-01-11 – 2016-01-14 (×10): 3 g via INTRAVENOUS
  Filled 2016-01-11 (×14): qty 3

## 2016-01-11 MED ORDER — LORAZEPAM 2 MG/ML IJ SOLN
1.0000 mg | INTRAMUSCULAR | Status: DC | PRN
  Administered 2016-01-11 – 2016-01-12 (×4): 2 mg via INTRAVENOUS
  Filled 2016-01-11 (×4): qty 1

## 2016-01-11 MED ORDER — CARVEDILOL 6.25 MG PO TABS
6.2500 mg | ORAL_TABLET | Freq: Two times a day (BID) | ORAL | Status: DC
  Administered 2016-01-11: 6.25 mg via ORAL
  Filled 2016-01-11 (×5): qty 1

## 2016-01-11 MED ORDER — DEXMEDETOMIDINE HCL IN NACL 200 MCG/50ML IV SOLN
0.4000 ug/kg/h | INTRAVENOUS | Status: DC
  Administered 2016-01-11: 0.6 ug/kg/h via INTRAVENOUS
  Administered 2016-01-11: 0.7 ug/kg/h via INTRAVENOUS
  Administered 2016-01-11: 2 ug/kg/h via INTRAVENOUS
  Administered 2016-01-11: 0.2 ug/kg/h via INTRAVENOUS
  Administered 2016-01-11 (×2): 2 ug/kg/h via INTRAVENOUS
  Filled 2016-01-11 (×4): qty 50
  Filled 2016-01-11: qty 100

## 2016-01-11 MED ORDER — FENTANYL CITRATE (PF) 100 MCG/2ML IJ SOLN
50.0000 ug | INTRAMUSCULAR | Status: DC | PRN
  Filled 2016-01-11: qty 2

## 2016-01-11 MED ORDER — HEPARIN BOLUS VIA INFUSION
3000.0000 [IU] | Freq: Once | INTRAVENOUS | Status: AC
  Administered 2016-01-11: 3000 [IU] via INTRAVENOUS
  Filled 2016-01-11: qty 3000

## 2016-01-11 MED ORDER — HEPARIN (PORCINE) IN NACL 100-0.45 UNIT/ML-% IJ SOLN
2450.0000 [IU]/h | INTRAMUSCULAR | Status: DC
  Administered 2016-01-11: 1150 [IU]/h via INTRAVENOUS
  Administered 2016-01-12: 1400 [IU]/h via INTRAVENOUS
  Administered 2016-01-12: 1650 [IU]/h via INTRAVENOUS
  Administered 2016-01-13: 2000 [IU]/h via INTRAVENOUS
  Administered 2016-01-13: 2200 [IU]/h via INTRAVENOUS
  Administered 2016-01-14: 2450 [IU]/h via INTRAVENOUS
  Administered 2016-01-14: 2200 [IU]/h via INTRAVENOUS
  Administered 2016-01-15: 2450 [IU]/h via INTRAVENOUS
  Filled 2016-01-11 (×15): qty 250

## 2016-01-11 MED ORDER — MIDAZOLAM HCL 2 MG/2ML IJ SOLN
INTRAMUSCULAR | Status: AC
  Administered 2016-01-11: 2 mg
  Filled 2016-01-11: qty 4

## 2016-01-11 MED ORDER — WHITE PETROLATUM GEL
Status: AC
  Administered 2016-01-11: 14:00:00
  Filled 2016-01-11: qty 1

## 2016-01-11 MED ORDER — FENTANYL CITRATE (PF) 100 MCG/2ML IJ SOLN
INTRAMUSCULAR | Status: AC
  Administered 2016-01-11: 100 ug
  Filled 2016-01-11: qty 4

## 2016-01-11 MED ORDER — PANTOPRAZOLE SODIUM 40 MG IV SOLR
40.0000 mg | Freq: Every day | INTRAVENOUS | Status: DC
  Administered 2016-01-12 – 2016-01-13 (×2): 40 mg via INTRAVENOUS
  Filled 2016-01-11 (×3): qty 40

## 2016-01-11 MED ORDER — DEXMEDETOMIDINE HCL IN NACL 400 MCG/100ML IV SOLN
0.4000 ug/kg/h | INTRAVENOUS | Status: DC
  Administered 2016-01-11: 2 ug/kg/h via INTRAVENOUS
  Administered 2016-01-12: 1.2 ug/kg/h via INTRAVENOUS
  Administered 2016-01-12: 1 ug/kg/h via INTRAVENOUS
  Administered 2016-01-12: 1.5 ug/kg/h via INTRAVENOUS
  Administered 2016-01-12: 2 ug/kg/h via INTRAVENOUS
  Filled 2016-01-11 (×2): qty 100
  Filled 2016-01-11: qty 200
  Filled 2016-01-11: qty 100

## 2016-01-11 MED ORDER — LORAZEPAM 2 MG/ML IJ SOLN: 2.0000 mg | INTRAMUSCULAR | Status: DC

## 2016-01-11 MED ORDER — ANTISEPTIC ORAL RINSE SOLUTION (CORINZ)
7.0000 mL | Freq: Four times a day (QID) | OROMUCOSAL | Status: DC
  Administered 2016-01-11 – 2016-01-15 (×12): 7 mL via OROMUCOSAL

## 2016-01-11 MED ORDER — LORAZEPAM 2 MG/ML IJ SOLN
2.0000 mg | INTRAMUSCULAR | Status: DC | PRN
  Administered 2016-01-11: 3 mg via INTRAVENOUS
  Administered 2016-01-11: 2 mg via INTRAVENOUS
  Administered 2016-01-11: 3 mg via INTRAVENOUS
  Filled 2016-01-11 (×2): qty 2

## 2016-01-11 MED ORDER — HYDRALAZINE HCL 20 MG/ML IJ SOLN: 10.0000 mg | Freq: Four times a day (QID) | INTRAMUSCULAR | Status: DC | PRN

## 2016-01-11 MED ORDER — DEXMEDETOMIDINE HCL IN NACL 200 MCG/50ML IV SOLN: 0.4000 ug/kg/h | INTRAVENOUS | Status: DC

## 2016-01-11 MED ORDER — HEPARIN BOLUS VIA INFUSION
4000.0000 [IU] | Freq: Once | INTRAVENOUS | Status: AC
  Administered 2016-01-11: 4000 [IU] via INTRAVENOUS
  Filled 2016-01-11: qty 4000

## 2016-01-11 NOTE — Progress Notes (Signed)
[place] for heparin Indication: chest pain/ACS  Allergies  Allergen Reactions  . Celebrex [Celecoxib] Rash  . Cymbalta [Duloxetine Hcl] Other (See Comments)    Did not work   . Lexapro [Escitalopram] Other (See Comments)    Did not work   . Trazodone And Nefazodone Other (See Comments)    Did not work   . Wellbutrin [Bupropion] Other (See Comments)    Did not work     Patient Measurements: Height: 5\' 10"  (177.8 cm) Weight: 203 lb 4.2 oz (92.2 kg) IBW/kg (Calculated) : 73 Heparin Dosing Weight: 91.7  Vital Signs: Temp: 97.5 F (36.4 C) (01/23 1930) Temp Source: Oral (01/23 1930) BP: 152/103 mmHg (01/23 1900) Pulse Rate: 74 (01/23 1900)  Labs:  Recent Labs  01/10/16 1712 01/10/16 2322 01/11/16 0236 01/11/16 0528 01/11/16 1200 01/11/16 1853  HGB 13.9  --   --   --   --   --   HCT 39.9  --   --   --   --   --   PLT 193  --   --   --   --   --   LABPROT  --   --   --   --  16.6*  --   INR  --   --   --   --  1.33  --   HEPARINUNFRC  --   --   --   --   --  <0.10*  CREATININE 0.98  --   --   --   --   --   CKTOTAL 47*  --   --  82  --   --   CKMB 1.3  --   --   --   --   --   TROPONINI 4.59* 4.53* 4.35* 4.46*  --   --     Estimated Creatinine Clearance: 82.3 mL/min (by C-G formula based on Cr of 0.98).  Assessment: 28 YOM presented with chest pain AMS and EtOH intoxication. Troponin was elevated. Cardiology has been consulted, and patient was started on IV heparin. Initial level is undetectable. No issues with the line per RN.    Goal of Therapy:  Heparin level 0.3-0.7 units/ml Monitor platelets by anticoagulation protocol: Yes   Plan:  - Heparin bolus 3000 units IV x 1 - Increase heparin gtt to 1400 units/hr - Check a 6 hour heparin level - Daily heparin level and CBC  Salome Arnt, PharmD, BCPS Pager # [phone] 01/11/2016 8:02 PM

## 2016-01-11 NOTE — Progress Notes (Signed)
[facility] PROGRESS NOTE  Joshua Henson [id number] DOB: 1946-09-17 DOA: 01/10/2016 PCP: Adella Hare, MD Brief History: This is 70 year old male with a past medical history significant for alcohol abuse, hypertension, stroke, prostate cancer s/p radiation, hearing loss 2/2 Mnire's disease; presents with a history of increasing falls and chest pain. He was found to be in altered mental status from alcohol intoxication, . Lab work revealed elevated troponins and his EKG does not show any ischemic changes. Cardiology consulted and recommendation given to start the patient on heparin gtt for his NSTEMI.   Assessment/Plan: 1. LEFT sided chest pain/ NSTEMI : - with elevated troponin's,  Repeat EKG pending. Initial EKG does not show any ischemic changes. - echocardiogram ordered.  - cardiology consulted, recommended heparin gtt, coreg and aspirin.  - he will probably need a cardiac cath once he is stable from the alcohol withdrawals.    2. Chronic alcohol abuse with intoxication and possible withdrawals: - received about 8 mg of IV ativan as per CIWA  In the last 4 hours.  - on banana bag.  - will monitor him for an hour, if his CIWA doesn't improve, plan to transfer to ICU. Discussed with Dr Lamonte Sakai.  - added on scheduled ativan in addition to [place].    3. Drug abuse with UDS positive for cocaine, opiates and benzo's: - elevated troponin's probably from cocaine use.  - not in a state to be counseled, confused and agitated.   4. Hypertension:  Optimal.    5. H/o stroke: Initial CT head unremarkable.   6. Multiple falls over the last 6 months: Wound recommend PT eval when he is more stable.  Rib fractures possible on the CXR.  Will get a rib series for further evaluation.   7. Tobacco abuse: Will start him on nicotine patch.     Code Status: full code.  Family Communication: no family at bedside.  Disposition Plan: pending further investigation.     Consultants:  Cardiology  PCCM.  Procedures:  ECHO  Antibiotics:  none  HPI/Subjective: Confused agitated, sitter at bedside.   Objective: Filed Vitals:   01/11/16 0400 01/11/16 0809  BP: 153/95 158/95  Pulse: 98 97  Temp: 99.5 F (37.5 C) 99.8 F (37.7 C)  Resp: 21 22    Intake/Output Summary (Last 24 hours) at 01/11/16 1034 Last data filed at 01/11/16 0500  Gross per 24 hour  Intake 1437.5 ml  Output    700 ml  Net  737.5 ml   Filed Weights   01/10/16 2153 01/11/16 0400  Weight: 92.67 kg (204 lb 4.8 oz) 93.26 kg (205 lb 9.6 oz)    Exam:   General:  Alert, agitated, restless  Cardiovascular: s1s2, tachycardic  Respiratory: tachypnea, crackles at bases, air entry fair  Abdomen: soft non tender non distended bowel sounds heard.  Musculoskeletal: trace pedal edema.   Data Reviewed: Basic Metabolic Panel:  Recent Labs Lab 01/10/16 1712  NA 140  K 4.1  CL 104  CO2 25  GLUCOSE 101*  BUN 10  CREATININE 0.98  CALCIUM 9.0   Liver Function Tests:  Recent Labs Lab 01/10/16 1712  AST 24  ALT 22  ALKPHOS 57  BILITOT 0.7  PROT 6.1*  ALBUMIN 3.3*   No results for input(s): LIPASE, AMYLASE in the last 168 hours. No results for input(s): AMMONIA in the last 168 hours. CBC:  Recent Labs Lab 01/10/16 1712  WBC 7.5  HGB 13.9  HCT 39.9  MCV 103.9*  PLT 193   Cardiac Enzymes:  Recent Labs Lab 01/10/16 1712 01/10/16 2322 01/11/16 0236 01/11/16 0528  CKTOTAL 47*  --   --  82  CKMB 1.3  --   --   --   TROPONINI 4.59* 4.53* 4.35* 4.46*   BNP (last 3 results) No results for input(s): BNP in the last 8760 hours.  ProBNP (last 3 results) No results for input(s): PROBNP in the last 8760 hours.  CBG: No results for input(s): GLUCAP in the last 168 hours.  No results found for this or any previous visit (from the past 240 hour(s)).   Studies: Dg Chest 2 View  01/10/2016  CLINICAL DATA:  Left lower rib pain.  Fell on  Wednesday. EXAM: CHEST  2 VIEW COMPARISON:  04/02/2015 FINDINGS: The heart is enlarged but stable. There is tortuosity of the thoracic aorta. Patchy bibasilar atelectasis or infiltrates. No effusions or edema. Possible nondisplaced lower left rib fractures. No pneumothorax or obvious pleural effusion. IMPRESSION: Cardiac enlargement and bibasilar infiltrates or atelectasis. Possible lower left rib fractures. Electronically Signed   By: Marijo Sanes M.D.   On: 01/10/2016 18:33   Ct Head Wo Contrast  01/10/2016  CLINICAL DATA:  Multiple falls.  Alcohol abuse. EXAM: CT HEAD WITHOUT CONTRAST TECHNIQUE: Contiguous axial images were obtained from the base of the skull through the vertex without intravenous contrast. COMPARISON:  04/02/2015 FINDINGS: There is no evidence of intracranial hemorrhage, brain edema, or other signs of acute infarction. There is no evidence of intracranial mass lesion or mass effect. No abnormal extraaxial fluid collections are identified. Mild cerebral atrophy and chronic small vessel disease is stable in appearance. Ventricles are stable in size. No evidence of skull fracture. Chronic bilateral maxillary sinusitis again noted. IMPRESSION: No acute intracranial abnormality. Stable mild cerebral atrophy and chronic small vessel disease. Chronic bilateral maxillary sinusitis. Electronically Signed   By: Earle Gell M.D.   On: 01/10/2016 18:23    Scheduled Meds: . aspirin EC  81 mg Oral Daily  . carvedilol  6.25 mg Oral BID WC  . cholecalciferol  2,000 Units Oral Daily  . citalopram  20 mg Oral QHS  . enoxaparin (LOVENOX) injection  40 mg Subcutaneous Q24H  . folic acid  1 mg Oral Daily  . magnesium oxide  400 mg Oral QHS  . multivitamin with minerals  1 tablet Oral Daily  . omega-3 acid ethyl esters  1 g Oral Daily  . pneumococcal 23 valent vaccine  0.5 mL Intramuscular Tomorrow-1000  . tamsulosin  0.4 mg Oral QPC supper  . thiamine  100 mg Oral Daily   Or  . thiamine  100 mg  Intravenous Daily   Continuous Infusions: . sodium chloride 10 mL/hr at 01/11/16 0509  . banana bag IV 1000 mL 125 mL/hr at 01/10/16 2230    Principal Problem:   Chest pain Active Problems:   Elevated troponin   CAD (coronary artery disease), native coronary artery   Alcohol abuse with intoxication (HCC)   Frequent falls   Left rib fracture   Acute encephalopathy   Polysubstance abuse    Time spent: 45 minutes.     [facility] Pager [phone] . If 7PM-7AM, please contact night-coverage at [email or web address], password [facility] 01/11/2016, 10:34 AM

## 2016-01-11 NOTE — Consult Note (Signed)
Name: Joshua Henson MRN: [id number] DOB: 05-23-1946    ADMISSION DATE:  01/10/2016 CONSULTATION DATE:  01/11/16  REFERRING MD :  Karleen Hampshire  CHIEF COMPLAINT:  Chest pain, falls   HISTORY OF PRESENT ILLNESS:  DETAVIOUS Henson is a 70 y.o. male with a PMH as outlined below including ETOH use and polysubstance abuse (UDS positive for cocaine, opiates, benzo's).  He presented to [facility] ED 01/22 with increase in quantity of mechanical falls as well as chest pain.  He drinks roughly 1 pint of liquor per day and per H&P notes, he reported that he had been drinking more in recent weeks.  In ED, he complained of back and knee pain due to his falls as well as left sided chest pain that was sharp in nature.  Pain was exacerbated by deep inspiration and positional changes and was relieved by staying still or using pain meds.  Girlfriend also reported confusion as well as memory loss over the previous 2 days.  He has had admissions in the past related to ETOH withdrawal as well as ETOH withdrawal seizures.  He was admitted for ETOH intoxication as well as NSTEMI thought to be due to cocaine induced ischemia vs NSTEMI.  PAST MEDICAL HISTORY :   has a past medical history of Prostate cancer ([place]) (2010); Stroke [facility]); Depression; Tachycardia; Hypertension; Insomnia; CAD (coronary artery disease), native coronary artery (01/10/2016); and Fatty liver, alcoholic ([id number]).  has past surgical history that includes none. Prior to Admission medications   Medication Sig Start Date End Date Taking? Authorizing Provider  ALPRAZolam Duanne Moron) 0.5 MG tablet Take 0.5-0.75 mg by mouth 2 (two) times daily as needed for anxiety or sleep.  12/14/15  Yes Historical Provider, MD  aspirin 81 MG EC tablet Take 1 tablet (81 mg total) by mouth daily. 07/11/14  Yes Daniel J Angiulli, PA-C  buPROPion (WELLBUTRIN) 100 MG tablet Take 200 mg by mouth daily. 12/14/15  Yes Historical Provider, MD  carvedilol (COREG) 3.125 MG tablet 3.125 mg. 01/01/16   Yes Historical Provider, MD  cholecalciferol (VITAMIN D) 1000 units tablet Take 2,000 Units by mouth daily.   Yes Historical Provider, MD  citalopram (CELEXA) 20 MG tablet Take 20 mg by mouth at bedtime. 12/20/15  Yes Historical Provider, MD  Coenzyme Q10 (CO Q-10) 200 MG CAPS Take 200 mg by mouth daily.   Yes Historical Provider, MD  folic acid (FOLVITE) 1 MG tablet Take 1 tablet (1 mg total) by mouth daily. 07/11/14  Yes Daniel J Angiulli, PA-C  HYDROcodone-acetaminophen (NORCO/VICODIN) 5-325 MG tablet Take 1 tablet by mouth every 6 (six) hours as needed for moderate pain.  01/06/16  Yes Historical Provider, MD  LORazepam (ATIVAN) 1 MG tablet Take 1 tablet (1 mg total) by mouth 3 (three) times daily. 04/08/15  Yes Thurnell Lose, MD  Magnesium 500 MG CAPS Take 500 mg by mouth at bedtime.   Yes Historical Provider, MD  Omega-3 Fatty Acids (FISH OIL) 1000 MG CAPS Take by mouth 2 (two) times daily.   Yes Historical Provider, MD  potassium chloride SA (K-DUR,KLOR-CON) 20 MEQ tablet Take 1 tablet (20 mEq total) by mouth daily. 12/11/14  Yes Ernestina Patches, MD  tamsulosin (FLOMAX) 0.4 MG CAPS capsule Take 0.4 mg by mouth daily.  11/25/14  Yes Historical Provider, MD  thiamine (VITAMIN B-1) 100 MG tablet Take 100 mg by mouth daily.   Yes Historical Provider, MD  triamterene-hydrochlorothiazide (DYAZIDE) 37.5-25 MG per capsule Take 1 capsule by mouth daily. 01/30/15  Yes Historical Provider, MD  chlordiazePOXIDE (LIBRIUM) 10 MG capsule Take 1 capsule (10 mg total) by mouth at bedtime. Patient not taking: Reported on 01/10/2016 04/08/15   Thurnell Lose, MD  CIALIS 20 MG tablet Take 20 mg by mouth as needed for erectile dysfunction.  12/31/14   Historical Provider, MD   Allergies  Allergen Reactions  . Celebrex [Celecoxib] Rash  . Cymbalta [Duloxetine Hcl] Other (See Comments)    Did not work   . Lexapro [Escitalopram] Other (See Comments)    Did not work   . Trazodone And Nefazodone Other (See Comments)     Did not work   . Wellbutrin [Bupropion] Other (See Comments)    Did not work     FAMILY HISTORY:  family history includes Cancer in his father, mother, and paternal grandfather; Diabetes in his mother; Heart disease in his maternal grandfather; Hypertension in his mother. SOCIAL HISTORY:  reports that he has been smoking Cigarettes.  He has a 15 pack-year smoking history. He has never used smokeless tobacco. He reports that he drinks about 4.2 oz of alcohol per week. He reports that he uses illicit drugs.  REVIEW OF SYSTEMS:  Unable to obtain due to encephalopathy.   SUBJECTIVE:  "I feel great". Does endorse some left sided chest pain. Currently confused / hallucinating.  VITAL SIGNS: Temp:  [98.2 F (36.8 C)-99.8 F (37.7 C)] 99.8 F (37.7 C) (01/23 0809) Pulse Rate:  [85-108] 95 (01/23 1037) Resp:  [15-33] 33 (01/23 1037) BP: (111-166)/(74-95) 143/86 mmHg (01/23 1037) SpO2:  [90 %-100 %] 97 % (01/23 1037) Weight:  [92.67 kg (204 lb 4.8 oz)-93.26 kg (205 lb 9.6 oz)] 93.26 kg (205 lb 9.6 oz) (01/23 0400)  PHYSICAL EXAMINATION: General: Adult male, resting in bed, in NAD. Neuro: Anxious, confused and disoriented.  MAE's. + asterixis. HEENT: [place]/AT. PERRL, sclerae anicteric. Cardiovascular: RRR, no M/R/G.  Lungs: Respirations even and unlabored.  CTA bilaterally, No W/R/R. Abdomen: BS x 4, soft, NT/ND.  Musculoskeletal: No gross deformities, no edema.  Skin: Intact, warm, no rashes.    Recent Labs Lab 01/10/16 1712  NA 140  K 4.1  CL 104  CO2 25  BUN 10  CREATININE 0.98  GLUCOSE 101*    Recent Labs Lab 01/10/16 1712  HGB 13.9  HCT 39.9  WBC 7.5  PLT 193   Dg Chest 2 View  01/10/2016  CLINICAL DATA:  Left lower rib pain.  Fell on Wednesday. EXAM: CHEST  2 VIEW COMPARISON:  04/02/2015 FINDINGS: The heart is enlarged but stable. There is tortuosity of the thoracic aorta. Patchy bibasilar atelectasis or infiltrates. No effusions or edema. Possible nondisplaced  lower left rib fractures. No pneumothorax or obvious pleural effusion. IMPRESSION: Cardiac enlargement and bibasilar infiltrates or atelectasis. Possible lower left rib fractures. Electronically Signed   By: Marijo Sanes M.D.   On: 01/10/2016 18:33   Ct Head Wo Contrast  01/10/2016  CLINICAL DATA:  Multiple falls.  Alcohol abuse. EXAM: CT HEAD WITHOUT CONTRAST TECHNIQUE: Contiguous axial images were obtained from the base of the skull through the vertex without intravenous contrast. COMPARISON:  04/02/2015 FINDINGS: There is no evidence of intracranial hemorrhage, brain edema, or other signs of acute infarction. There is no evidence of intracranial mass lesion or mass effect. No abnormal extraaxial fluid collections are identified. Mild cerebral atrophy and chronic small vessel disease is stable in appearance. Ventricles are stable in size. No evidence of skull fracture. Chronic bilateral maxillary sinusitis again noted. IMPRESSION:  No acute intracranial abnormality. Stable mild cerebral atrophy and chronic small vessel disease. Chronic bilateral maxillary sinusitis. Electronically Signed   By: Earle Gell M.D.   On: 01/10/2016 18:23    ASSESSMENT / PLAN:  STUDIES:  CT head 01/22 > no acute process. CXR 01/22 > cardiac enlargement.  Possible lower left rib fx's. Echo 01/23 >  CULTURES: None.  ANTIBIOTICS: None.  SIGNIFICANT EVENTS: 01/22 > admitted with ETOH withdrawal. 01/23 > PCCM consulted for consideration transfer to ICU for initiation of precedex.  LINES/TUBES: None.  ASSESSMENT / PLAN:  NEUROLOGIC A:   Acute metabolic encephalopathy - exacerbated by withdrawal.  Doubt true wernicke's at this point. ETOH withdrawal. Polysubstance abuse - UDS positive for cocaine, opiates, benzo's. Hx depression, Meniere's disease with hearing loss, CVA, insomnia. P:   Transfer to ICU and start precedex. Continue ativan PRN. Continue thiamine / folate / multivitamin. Substance and ETOH  abuse counseling. Hold outpatient alprazolam, bupropion, chlordiazepoxide, citalopram, norco.  PULMONARY A: Tobacco use disorder. P:   Continue nicotine patch. CXR in AM.  CARDIOVASCULAR A:  NSTEMI vs cocaine induced ischemia. ? ETOH cardiomyopathy. Hx CAD, HTN.  P:  Cardiology following. Continue heparin, carvedilol, ASA. Follow up echo. May have LHC once over withdrawal. Monitor hemodynamics with initiation of precedex.  RENAL A:   No acute issues. P:   Correct electrolytes as indicated. BMP in AM.  GASTROINTESTINAL A:   Nutrition. Hx ETOH fatty liver disease. P:   Continue reg diet.  HEMATOLOGIC / ONCOLOGIC A:   Macrocytosis - due to ETOH. VTE Prophylaxis. Hx prostate CA - s/p XRT. P:  SCD's / Heparin gtt. CBC in AM.  INFECTIOUS A:   No indication of infection. P:   Monitor clinically.  ENDOCRINE A:   No acute issues.   P:   Monitor glucose on BMP.   Family updated: Girlfriend updated by Dr. Lamonte Sakai 1/23  Interdisciplinary Family Meeting v [place] Meeting:  Due by: 01/17/16.    Montey Hora, [facility] Pager: [phone]  or [phone] 01/11/2016, 11:46 AM   Attending Note:  I have examined patient, reviewed labs, studies and notes. I have discussed the case with Junius Roads, and I agree with the data and plans as I have amended above. 70 year old man with a history of polysubstance abuse including alcohol, benzodiazepines, narcotics, cocaine. Admitted with fall associated with increased alcohol use over the last few weeks. Also with chest discomfort that may relate to a fall with fractured ribs. He had a positive troponin consistent with a non-ST elevation MI in the setting of cocaine use. He has had progressive confusion, hallucination, agitation consistent with delirium tremens. He has failed to respond adequately to intermittent benzodiazepines. On my evaluation he is awake but clearly  confused. His memory is poor and he is oriented only to self unless he's prompted. He has no obvious tremor. We will plan to move him to ICU and initiate dexmedetomidine, continue his benzodiazepines when necessary. He is at some risk for requiring deep sedation and mechanical ventilation. I do not see a need to do this at this time. I explained the plan and the potential risk for intubation with his girlfriend and HCPOA at bedside. Independent critical care time is 40 minutes.   Baltazar Apo, MD, PhD 01/11/2016, 2:34 PM [facility] [phone] or if no answer [phone]

## 2016-01-11 NOTE — Progress Notes (Signed)
Patient Name: Joshua Henson Date of Encounter: 01/11/2016  Principal Problem:   Chest pain Active Problems:   Elevated troponin   CAD (coronary artery disease), native coronary artery   Alcohol abuse with intoxication ([place])   Frequent falls   Left rib fracture   Acute encephalopathy   Polysubstance abuse   Length of Stay:   SUBJECTIVE  The patient has right sided chest pain that comes and goes and is tolerable.  CURRENT MEDS . aspirin EC  81 mg Oral Daily  . cholecalciferol  2,000 Units Oral Daily  . citalopram  20 mg Oral QHS  . enoxaparin (LOVENOX) injection  40 mg Subcutaneous Q24H  . folic acid  1 mg Oral Daily  . magnesium oxide  400 mg Oral QHS  . multivitamin with minerals  1 tablet Oral Daily  . omega-3 acid ethyl esters  1 g Oral Daily  . pneumococcal 23 valent vaccine  0.5 mL Intramuscular Tomorrow-1000  . tamsulosin  0.4 mg Oral QPC supper  . thiamine  100 mg Oral Daily   Or  . thiamine  100 mg Intravenous Daily   . sodium chloride 10 mL/hr at 01/11/16 0509  . banana bag IV 1000 mL 125 mL/hr at 01/10/16 2230   OBJECTIVE  Filed Vitals:   01/10/16 1945 01/10/16 2153 01/11/16 0400 01/11/16 0809  BP: 166/82 163/89 153/95 158/95  Pulse: 105 108 98 97  Temp:  99.1 F (37.3 C) 99.5 F (37.5 C) 99.8 F (37.7 C)  TempSrc:  Oral Oral Axillary  Resp: 20 22 21 22   Height:  5\' 10"  (1.778 m)    Weight:  204 lb 4.8 oz (92.67 kg) 205 lb 9.6 oz (93.26 kg)   SpO2: 93% 95% 94% 93%    Intake/Output Summary (Last 24 hours) at 01/11/16 1015 Last data filed at 01/11/16 0500  Gross per 24 hour  Intake 1437.5 ml  Output    700 ml  Net  737.5 ml   Filed Weights   01/10/16 2153 01/11/16 0400  Weight: 204 lb 4.8 oz (92.67 kg) 205 lb 9.6 oz (93.26 kg)    PHYSICAL EXAM  General: AAO x1, NAD. Neuro: Alert and oriented X 3. Moves all extremities spontaneously. Psych: Normal affect. HEENT:  Normal  Neck: Supple without bruits or JVD. Lungs:  Resp regular and  unlabored, CTA. Heart: RRR no s3, s4, or murmurs. Abdomen: tender, distended, BS + x 4.  Extremities: No clubbing, cyanosis or edema. DP/PT/Radials 2+ and equal bilaterally.  Accessory Clinical Findings  CBC  Recent Labs  01/10/16 1712  WBC 7.5  HGB 13.9  HCT 39.9  MCV 103.9*  PLT 0000000   Basic Metabolic Panel  Recent Labs  01/10/16 1712  NA 140  K 4.1  CL 104  CO2 25  GLUCOSE 101*  BUN 10  CREATININE 0.98  CALCIUM 9.0   Liver Function Tests  Recent Labs  01/10/16 1712  AST 24  ALT 22  ALKPHOS 57  BILITOT 0.7  PROT 6.1*  ALBUMIN 3.3*    Recent Labs  01/10/16 1712 01/10/16 2322 01/11/16 0236 01/11/16 0528  CKTOTAL 47*  --   --  82  CKMB 1.3  --   --   --   TROPONINI 4.59* 4.53* 4.35* 4.46*   Dg Chest 2 View  01/10/2016  CLINICAL DATA:  Left lower rib pain.  Fell on Wednesday. EXAM: CHEST  2 VIEW COMPARISON:  04/02/2015 FINDINGS: The heart is enlarged but stable.  There is tortuosity of the thoracic aorta. Patchy bibasilar atelectasis or infiltrates. No effusions or edema. Possible nondisplaced lower left rib fractures. No pneumothorax or obvious pleural effusion. IMPRESSION: Cardiac enlargement and bibasilar infiltrates or atelectasis. Possible lower left rib fractures.   Ct Head Wo Contrast  01/10/2016  CLINICAL DATA:  Multiple falls.  Alcohol abuse.  IMPRESSION: No acute intracranial abnormality. Stable mild cerebral atrophy and chronic small vessel disease. Chronic bilateral maxillary sinusitis. Electronically Signed   By: Earle Gell M.D.   On: 01/10/2016 18:23   Echo: 06/2014 Left ventricle: The cavity size was normal. Systolic function was normal. Wall motion was normal; there were no regional wall motion abnormalities. There was an increased relative contribution of atrial contraction to ventricular filling.  TELE: SR to ST, HR 94 - 102  ECG: ST, otherwise normal    ASSESSMENT AND PLAN  1. NSTEMI - may be cocaine induced myocardial  ischemia versus non-STEMI 2. Positive drug screen for cocaine 3. Alcoholism with acute alcoholic intoxication and withdrawal 4. Coronary artery disease as manifest by CT scan with calcification previously 5. Depression 6. Fatty liver 7. Hypertension 8. Mnire's disease with hearing loss 9. Recent fall with rib fracture  RECOMMENDATION: Despite several falls, initiate Heparin drip for NSTEMI, Hb normal and no signs of bleeding, he has a sitter to prevent further falls.  Withdrawal managed by internal medicine.  Start carvedilol 6.25 mg po BID. Continue ASA, hold statins as acute etoh intoxication with elevated AST. Previous echo in 2015 with normal LVEF, we will repeat. Once he recovers consider left cardiac cath.  Signed, Dorothy Spark MD, [facility] 01/11/2016

## 2016-01-11 NOTE — Progress Notes (Signed)
Patient's becoming progressively more sweaty, agitated, restless and shaky with CIWA scale now 19, lungs are now beginning to become more wet sounding at this time. Patient has had several doses of Ativan and Morphine with very little effects. Sharee Pimple charge nurse made aware and will now text page info to [facility] hospitalist to see what they want to do next. Patient is still cooperative but very anxious, will continue to monitor.

## 2016-01-11 NOTE — Progress Notes (Signed)
PT Cancellation Note  Patient Details Name: Joshua Henson MRN: [id number] DOB: 25-Sep-1946   Cancelled Treatment:    Reason Eval/Treat Not Completed: Patient not medically ready. Noted pt with elevated troponin and events of this morning. Will hold PT eval until pt more stable.    [place], [place] 01/11/2016, 9:57 AM

## 2016-01-11 NOTE — Progress Notes (Signed)
Report received via Freida Busman RN from the ED, reviewed chart, orders, labs, VS, meds and patient's general condition, awaiting arrival to 3W-27

## 2016-01-11 NOTE — Care Management Obs Status (Signed)
[facility] NOTIFICATION   Patient Details  Name: Joshua Henson MRN: [id number] Date of Birth: [birth year]   Medicare Observation Status Notification Given:  Yes    Bethena Roys, RN 01/11/2016, 11:07 AM

## 2016-01-11 NOTE — Progress Notes (Signed)
ANTIBIOTIC CONSULT NOTE - INITIAL  Pharmacy Consult for unasyn Indication: rule out pneumonia  Allergies  Allergen Reactions  . Celebrex [Celecoxib] Rash  . Cymbalta [Duloxetine Hcl] Other (See Comments)    Did not work   . Lexapro [Escitalopram] Other (See Comments)    Did not work   . Trazodone And Nefazodone Other (See Comments)    Did not work   . Wellbutrin [Bupropion] Other (See Comments)    Did not work     Patient Measurements: Height: 5\' 10"  (177.8 cm) Weight: 203 lb 4.2 oz (92.2 kg) IBW/kg (Calculated) : 73 Adjusted Body Weight:   Vital Signs: Temp: 97.5 F (36.4 C) (01/23 1930) Temp Source: Oral (01/23 1930) BP: 176/88 mmHg (01/23 2130) Pulse Rate: 57 (01/23 2130) Intake/Output from previous day: 01/22 0701 - 01/23 0700 In: 1437.5 [I.V.:1437.5] Out: 700 [Urine:700] Intake/Output from this shift: Total I/O In: 93.3 [I.V.:93.3] Out: -   Labs:  Recent Labs  01/10/16 1712  WBC 7.5  HGB 13.9  PLT 193  CREATININE 0.98   Estimated Creatinine Clearance: 82.3 mL/min (by C-G formula based on Cr of 0.98). No results for input(s): VANCOTROUGH, VANCOPEAK, VANCORANDOM, GENTTROUGH, GENTPEAK, GENTRANDOM, TOBRATROUGH, TOBRAPEAK, TOBRARND, AMIKACINPEAK, AMIKACINTROU, AMIKACIN in the last 72 hours.   Microbiology: Recent Results (from the past 720 hour(s))  MRSA PCR Screening     Status: None   Collection Time: 01/11/16  1:25 PM  Result Value Ref Range Status   MRSA by PCR NEGATIVE NEGATIVE Final    Comment:        The GeneXpert MRSA Assay (FDA approved for NASAL specimens only), is one component of a comprehensive MRSA colonization surveillance program. It is not intended to diagnose MRSA infection nor to guide or monitor treatment for MRSA infections.     Medical History: Past Medical History  Diagnosis Date  . Prostate cancer ([place]) 2010    External beam radiation (urol - Risa Grill, XRT Valere Dross)  . Stroke ([place])   . Depression   . Tachycardia   .  Hypertension   . Insomnia   . CAD (coronary artery disease), native coronary artery 01/10/2016    3 vessel coronary calcification noted on CT scan 04/03/15    . Fatty liver, alcoholic [id number]    Medications:  Anti-infectives    Start     Dose/Rate Route Frequency Ordered Stop   01/11/16 2300  Ampicillin-Sulbactam (UNASYN) 3 g in sodium chloride 0.9 % 100 mL IVPB     3 g 100 mL/hr over 60 Minutes Intravenous Every 6 hours 01/11/16 2200       Assessment: 21 yom presented with CP and AMS. Now adding unasyn for possible aspiration pneumonia. Pt is afebrile and WBC is WNL. SCr is WNL.   Unasyn 1/23>>  Goal of Therapy:  Eradication of infection  Plan:  - Unasyn 3gm IV Q6H - F/u renal fxn, C&S, clinical status  Houa Ackert, Rande Lawman 01/11/2016,10:01 PM

## 2016-01-11 NOTE — Progress Notes (Signed)
Patient to 3W-27 via stretcher, assisted to bed and started his assessment. Patient is very shaky and anxious. Patient is c/o left rib pain and had several falls recently which cause some bruising, will give his Ativan 2mg  IV since his CIWA score was 14-15 and give some Norco but will probably need to give hism some Morphine as well just don't want to give it with his Ativan, will address with charge nurse the high possibility of needing a sitter since patient is so restless and anxious, will continue to monitor.

## 2016-01-11 NOTE — Progress Notes (Signed)
ANTICOAGULATION CONSULT NOTE - Initial Consult  Pharmacy Consult for heparin Indication: chest pain/ACS  Allergies  Allergen Reactions  . Celebrex [Celecoxib] Rash  . Cymbalta [Duloxetine Hcl] Other (See Comments)    Did not work   . Lexapro [Escitalopram] Other (See Comments)    Did not work   . Trazodone And Nefazodone Other (See Comments)    Did not work   . Wellbutrin [Bupropion] Other (See Comments)    Did not work     Patient Measurements: Height: 5\' 10"  (177.8 cm) Weight: 205 lb 9.6 oz (93.26 kg) IBW/kg (Calculated) : 73 Heparin Dosing Weight: 91.7  Vital Signs: Temp: 99.8 F (37.7 C) (01/23 0809) Temp Source: Axillary (01/23 0809) BP: 143/86 mmHg (01/23 1037) Pulse Rate: 95 (01/23 1037)  Labs:  Recent Labs  01/10/16 1712 01/10/16 2322 01/11/16 0236 01/11/16 0528  HGB 13.9  --   --   --   HCT 39.9  --   --   --   PLT 193  --   --   --   CREATININE 0.98  --   --   --   CKTOTAL 47*  --   --  82  CKMB 1.3  --   --   --   TROPONINI 4.59* 4.53* 4.35* 4.46*    Estimated Creatinine Clearance: 82.8 mL/min (by C-G formula based on Cr of 0.98).   Medical History: Past Medical History  Diagnosis Date  . Prostate cancer ([place]) 2010    External beam radiation (urol - Risa Grill, XRT Valere Dross)  . Stroke ([place])   . Depression   . Tachycardia   . Hypertension   . Insomnia   . CAD (coronary artery disease), native coronary artery 01/10/2016    3 vessel coronary calcification noted on CT scan 04/03/15    . Fatty liver, alcoholic [id number]    Assessment: 35 YOM presented with chest pain AMS and EtOH intoxication. Troponin was elevated. Cardiology has been consulted, will start IV heparin, pending cath once he is stable from EtOH withdrawal. He is not on anticoagulation PTA, hgb 13.9, plt 193K, LFTs wnl. He received a dose of lovenox 40 mg last night at 2240  Goal of Therapy:  Heparin level 0.3-0.7 units/ml Monitor platelets by anticoagulation protocol: Yes   Plan:   - Heparin bolus 4000 units - Heparin infusion 1150 units/hr - F/u  6 hr Heparin level at 1800 - Daily heparin level and cbc - f/u plan for cath - D/C lovenox  Maryanna Shape, PharmD, BCPS  Clinical Pharmacist  Pager: [phone]   01/11/2016,10:56 AM

## 2016-01-11 NOTE — Procedures (Signed)
Intubation Procedure Note Joshua Henson [id number] [birth year]  Procedure: Intubation Indications: Respiratory insufficiency  Procedure Details Consent: Unable to obtain consent because of altered level of consciousness. Time Out: Verified patient identification, verified procedure, site/side was marked, verified correct patient position, special equipment/implants available, medications/allergies/relevent history reviewed, required imaging and test results available.  Performed  Drugs Fentanyl 200 mg, Versed 4 mg, Etomidate 20 mg Size 8 tube passed through cords under direct visualization Placement confirmed with bilateral breath sounds, positive EtCO2 change and smoke in tube   Evaluation Hemodynamic Status: BP stable throughout; O2 sats: stable throughout Patient's Current Condition: stable Complications: No apparent complications Patient did tolerate procedure well. Chest X-ray ordered to verify placement.  CXR: pending.   Joshua Henson 01/11/2016  70 yo male admitted earlier with ETOH/cocaine withdrawal, NSTEMI, acute encephalopathy.  Developed progressive hypoxia from aspiration and CHF.    He was given versed, fentanyl, etomidated.  Inserted #8 ETT to 24 cm at lip.  Placement confirmed by CO2 detector and ausculation.  I was present the whole time.  Chesley Mires, MD [facility] Pulmonary/Critical Care 01/11/2016, 10:36 PM Pager:  [phone] After 3pm call: [phone]

## 2016-01-12 ENCOUNTER — Inpatient Hospital Stay (HOSPITAL_COMMUNITY): Payer: Medicare Other

## 2016-01-12 DIAGNOSIS — J9601 Acute respiratory failure with hypoxia: Secondary | ICD-10-CM

## 2016-01-12 DIAGNOSIS — R079 Chest pain, unspecified: Secondary | ICD-10-CM

## 2016-01-12 LAB — BASIC METABOLIC PANEL
Anion gap: 13 (ref 5–15)
BUN: 9 mg/dL (ref 6–20)
CALCIUM: 8.5 mg/dL — AB (ref 8.9–10.3)
CHLORIDE: 102 mmol/L (ref 101–111)
CO2: 22 mmol/L (ref 22–32)
CREATININE: 0.91 mg/dL (ref 0.61–1.24)
GFR calc non Af Amer: >60 mL/min (ref >60)
GLUCOSE: 152 mg/dL — AB (ref 65–99)
Potassium: 3 mmol/L — ABNORMAL LOW (ref 3.5–5.1)
Sodium: 137 mmol/L (ref 135–145)

## 2016-01-12 LAB — CBC
HCT: 38.7 % — ABNORMAL LOW (ref 39.0–52.0)
Hemoglobin: 13.5 g/dL (ref 13.0–17.0)
MCH: 35.6 pg — AB (ref 26.0–34.0)
MCHC: 34.9 g/dL (ref 30.0–36.0)
MCV: 102.1 fL — AB (ref 78.0–100.0)
PLATELETS: 119 10*3/uL — AB (ref 150–400)
RBC: 3.79 MIL/uL — AB (ref 4.22–5.81)
RDW: 13.3 % (ref 11.5–15.5)
WBC: 8.1 10*3/uL (ref 4.0–10.5)

## 2016-01-12 LAB — HEPARIN LEVEL (UNFRACTIONATED)
HEPARIN UNFRACTIONATED: 0.29 [IU]/mL — AB (ref 0.30–0.70)
Heparin Unfractionated: 0.1 IU/mL — ABNORMAL LOW (ref 0.30–0.70)
Heparin Unfractionated: 0.23 IU/mL — ABNORMAL LOW (ref 0.30–0.70)

## 2016-01-12 LAB — MAGNESIUM
MAGNESIUM: 1.6 mg/dL — AB (ref 1.7–2.4)
MAGNESIUM: 2 mg/dL (ref 1.7–2.4)

## 2016-01-12 LAB — RENAL FUNCTION PANEL
ANION GAP: 11 (ref 5–15)
Albumin: 2.7 g/dL — ABNORMAL LOW (ref 3.5–5.0)
BUN: 10 mg/dL (ref 6–20)
CALCIUM: 8.3 mg/dL — AB (ref 8.9–10.3)
CO2: 24 mmol/L (ref 22–32)
CREATININE: 0.79 mg/dL (ref 0.61–1.24)
Chloride: 102 mmol/L (ref 101–111)
GFR calc Af Amer: >60 mL/min (ref >60)
GFR calc non Af Amer: >60 mL/min (ref >60)
Glucose, Bld: 142 mg/dL — ABNORMAL HIGH (ref 65–99)
PHOSPHORUS: 2.6 mg/dL (ref 2.5–4.6)
POTASSIUM: 3.2 mmol/L — AB (ref 3.5–5.1)
SODIUM: 137 mmol/L (ref 135–145)

## 2016-01-12 LAB — STREP PNEUMONIAE URINARY ANTIGEN: Strep Pneumo Urinary Antigen: NEGATIVE

## 2016-01-12 LAB — PHOSPHORUS: Phosphorus: 1.7 mg/dL — ABNORMAL LOW (ref 2.5–4.6)

## 2016-01-12 MED ORDER — MIDAZOLAM HCL 2 MG/2ML IJ SOLN
1.0000 mg | INTRAMUSCULAR | Status: DC | PRN
  Filled 2016-01-12: qty 2

## 2016-01-12 MED ORDER — POTASSIUM PHOSPHATES 15 MMOLE/5ML IV SOLN
30.0000 mmol | Freq: Once | INTRAVENOUS | Status: AC
  Administered 2016-01-12: 30 mmol via INTRAVENOUS
  Filled 2016-01-12: qty 10

## 2016-01-12 MED ORDER — MAGNESIUM SULFATE 2 GM/50ML IV SOLN
2.0000 g | Freq: Once | INTRAVENOUS | Status: AC
  Administered 2016-01-12: 2 g via INTRAVENOUS
  Filled 2016-01-12: qty 50

## 2016-01-12 MED ORDER — MIDAZOLAM HCL 2 MG/2ML IJ SOLN
2.0000 mg | INTRAMUSCULAR | Status: DC | PRN
  Administered 2016-01-12: 2 mg via INTRAVENOUS
  Filled 2016-01-12: qty 2

## 2016-01-12 MED ORDER — FENTANYL BOLUS VIA INFUSION
25.0000 ug | INTRAVENOUS | Status: DC | PRN
  Filled 2016-01-12: qty 50

## 2016-01-12 MED ORDER — FENTANYL CITRATE (PF) 100 MCG/2ML IJ SOLN: 100.0000 ug | INTRAMUSCULAR | Status: DC | PRN

## 2016-01-12 MED ORDER — HEPARIN BOLUS VIA INFUSION
1400.0000 [IU] | Freq: Once | INTRAVENOUS | Status: AC
  Administered 2016-01-12: 1400 [IU] via INTRAVENOUS
  Filled 2016-01-12: qty 1400

## 2016-01-12 MED ORDER — POTASSIUM CHLORIDE CRYS ER 20 MEQ PO TBCR
40.0000 meq | EXTENDED_RELEASE_TABLET | Freq: Two times a day (BID) | ORAL | Status: DC
  Administered 2016-01-12 (×2): 40 meq via ORAL
  Filled 2016-01-12 (×4): qty 2

## 2016-01-12 MED ORDER — HEPARIN BOLUS VIA INFUSION
2000.0000 [IU] | Freq: Once | INTRAVENOUS | Status: AC
  Administered 2016-01-12: 2000 [IU] via INTRAVENOUS
  Filled 2016-01-12: qty 2000

## 2016-01-12 MED ORDER — ASPIRIN 81 MG PO CHEW
81.0000 mg | CHEWABLE_TABLET | Freq: Every day | ORAL | Status: DC
  Administered 2016-01-12 – 2016-01-16 (×4): 81 mg
  Filled 2016-01-12 (×5): qty 1

## 2016-01-12 MED ORDER — MIDAZOLAM HCL 2 MG/2ML IJ SOLN: 1.0000 mg | INTRAMUSCULAR | Status: DC | PRN

## 2016-01-12 MED ORDER — CARVEDILOL 3.125 MG PO TABS
3.1250 mg | ORAL_TABLET | Freq: Two times a day (BID) | ORAL | Status: DC
  Administered 2016-01-12 – 2016-01-13 (×2): 3.125 mg
  Filled 2016-01-12 (×4): qty 1

## 2016-01-12 MED ORDER — MIDAZOLAM HCL 2 MG/2ML IJ SOLN
1.0000 mg | INTRAMUSCULAR | Status: DC | PRN
  Administered 2016-01-12 (×3): 2 mg via INTRAVENOUS
  Filled 2016-01-12 (×3): qty 2

## 2016-01-12 MED ORDER — SODIUM CHLORIDE 0.9 % IV SOLN
25.0000 ug/h | INTRAVENOUS | Status: DC
  Administered 2016-01-12: 25 ug/h via INTRAVENOUS
  Administered 2016-01-12 – 2016-01-13 (×3): 400 ug/h via INTRAVENOUS
  Filled 2016-01-12 (×4): qty 50

## 2016-01-12 MED ORDER — PERFLUTREN LIPID MICROSPHERE
1.0000 mL | INTRAVENOUS | Status: AC | PRN
  Administered 2016-01-12: 4 mL via INTRAVENOUS
  Filled 2016-01-12: qty 10

## 2016-01-12 NOTE — Progress Notes (Signed)
Patient Name: Joshua Henson Date of Encounter: 01/12/2016  Active Problems:   Elevated troponin   Chest pain   CAD (coronary artery disease), native coronary artery   Alcohol abuse with intoxication ([place])   Frequent falls   Left rib fracture   Acute encephalopathy   Polysubstance abuse   Delirium tremens ([place])   NSTEMI (non-ST elevated myocardial infarction) ([place])   Sepsis due to Enterococcus with acute renal failure and metabolic encephalopathy ([place])   Acute respiratory failure with hypoxia ([place])   Length of Stay: 1  SUBJECTIVE  The patient had to be intubated yesterday.   CURRENT MEDS . ampicillin-sulbactam (UNASYN) IV  3 g Intravenous Q6H  . antiseptic oral rinse  7 mL Mouth Rinse BID  . antiseptic oral rinse  7 mL Mouth Rinse QID  . aspirin  81 mg Per Tube Daily  . carvedilol  3.125 mg Per Tube BID WC  . chlorhexidine gluconate  15 mL Mouth Rinse BID  . folic acid  1 mg Oral Daily  . multivitamin with minerals  1 tablet Oral Daily  . nicotine  21 mg Transdermal Daily  . pantoprazole (PROTONIX) IV  40 mg Intravenous Daily  . pneumococcal 23 valent vaccine  0.5 mL Intramuscular Tomorrow-1000  . potassium phosphate IVPB (mmol)  30 mmol Intravenous Once  . tamsulosin  0.4 mg Oral QPC supper  . thiamine  100 mg Oral Daily   Or  . thiamine  100 mg Intravenous Daily   . sodium chloride 10 mL/hr at 01/11/16 0509  . fentaNYL infusion INTRAVENOUS 100 mcg/hr (01/12/16 0900)  . heparin 1,650 Units (01/12/16 0800)   OBJECTIVE  Filed Vitals:   01/12/16 0700 01/12/16 0730 01/12/16 0800 01/12/16 0900  BP:  149/95 100/75   Pulse: 82 65 54 59  Temp:  99 F (37.2 C)    TempSrc:  Oral    Resp: 24 22 24 22   Height:      Weight:      SpO2: 99% 95% 97% 97%    Intake/Output Summary (Last 24 hours) at 01/12/16 1009 Last data filed at 01/12/16 0900  Gross per 24 hour  Intake 1341.9 ml  Output    950 ml  Net  391.9 ml   Filed Weights   01/10/16 2153 01/11/16 0400  01/11/16 1319  Weight: 204 lb 4.8 oz (92.67 kg) 205 lb 9.6 oz (93.26 kg) 203 lb 4.2 oz (92.2 kg)    PHYSICAL EXAM  General: AAO x1, NAD. Neuro: Alert and oriented X 3. Moves all extremities spontaneously. Psych: Normal affect. HEENT:  Normal  Neck: Supple without bruits or JVD. Lungs:  Resp regular and unlabored, CTA. Heart: RRR no s3, s4, or murmurs. Abdomen: tender, distended, BS + x 4.  Extremities: No clubbing, cyanosis or edema. DP/PT/Radials 2+ and equal bilaterally.  Accessory Clinical Findings  CBC  Recent Labs  01/10/16 1712 01/12/16 0219  WBC 7.5 8.1  HGB 13.9 13.5  HCT 39.9 38.7*  MCV 103.9* 102.1*  PLT 193 123456*   Basic Metabolic Panel  Recent Labs  01/10/16 1712 01/12/16 0219  NA 140 137  K 4.1 3.0*  CL 104 102  CO2 25 22  GLUCOSE 101* 152*  BUN 10 9  CREATININE 0.98 0.91  CALCIUM 9.0 8.5*  MG  --  1.6*  PHOS  --  1.7*   Liver Function Tests  Recent Labs  01/10/16 1712  AST 24  ALT 22  ALKPHOS 57  BILITOT  0.7  PROT 6.1*  ALBUMIN 3.3*    Recent Labs  01/10/16 1712 01/10/16 2322 01/11/16 0236 01/11/16 0528  CKTOTAL 47*  --   --  82  CKMB 1.3  --   --   --   TROPONINI 4.59* 4.53* 4.35* 4.46*   Dg Chest 2 View  01/10/2016  CLINICAL DATA:  Left lower rib pain.  Fell on Wednesday. EXAM: CHEST  2 VIEW COMPARISON:  04/02/2015 FINDINGS: The heart is enlarged but stable. There is tortuosity of the thoracic aorta. Patchy bibasilar atelectasis or infiltrates. No effusions or edema. Possible nondisplaced lower left rib fractures. No pneumothorax or obvious pleural effusion. IMPRESSION: Cardiac enlargement and bibasilar infiltrates or atelectasis. Possible lower left rib fractures.   Ct Head Wo Contrast  01/10/2016  CLINICAL DATA:  Multiple falls.  Alcohol abuse.  IMPRESSION: No acute intracranial abnormality. Stable mild cerebral atrophy and chronic small vessel disease. Chronic bilateral maxillary sinusitis. Electronically Signed   By: Earle Gell M.D.   On: 01/10/2016 18:23   Echo: 06/2014 Left ventricle: The cavity size was normal. Systolic function was normal. Wall motion was normal; there were no regional wall motion abnormalities. There was an increased relative contribution of atrial contraction to ventricular filling.  TELE: SR to ST, HR 94 - 102  ECG: ST, otherwise normal    ASSESSMENT AND PLAN  1. NSTEMI - may be cocaine induced myocardial ischemia versus non-STEMI 2. Positive drug screen for cocaine 3. Alcoholism with acute alcoholic intoxication and withdrawal 4. Coronary artery disease as manifest by CT scan with calcification previously 5. Depression 6. Fatty liver 7. Hypertension 8. Mnire's disease with hearing loss 9. Recent fall with rib fracture  RECOMMENDATION: Despite several falls, initiated Heparin drip for NSTEMI, continue for now as troponin not downtrending yet, Hb normal and stable, and no signs of bleeding, he is now intubated sec to withdrawal symptoms.   Started carvedilol 6.25 mg po BID yesterday, decreased to 3.125 mg po BID and HR controlled. Continue ASA, hold statins as acute etoh intoxication with elevated AST. Previous echo in 2015 with normal LVEF, repeat is pending. Once he recovers consider left cardiac cath.  Signed, Dorothy Spark MD, [facility] 01/12/2016

## 2016-01-12 NOTE — Clinical Documentation Improvement (Signed)
Internal Medicine  Can the diagnosis of CHF be further specified? Thank you    Acuity - Acute, Chronic, Acute on Chronic   Type - Systolic, Diastolic, Systolic and Diastolic  Other  Clinically Undetermined   Document any associated diagnoses/conditions   Supporting Information: NSTEMI vs Cocaine induced NSTEMI, Hypertension   Please exercise your independent, professional judgment when responding. A specific answer is not anticipated or expected.   Thank You,  [place] [phone]

## 2016-01-12 NOTE — Progress Notes (Signed)
Name: Joshua Henson MRN: [id number] DOB: 1946/09/25    ADMISSION DATE:  01/10/2016 CONSULTATION DATE:  01/11/16  REFERRING MD :  Karleen Hampshire  CHIEF COMPLAINT:  Chest pain, falls  Brief Summary:  Joshua Henson is a 70 y.o. male with ETOH use and polysubstance abuse (UDS positive for cocaine, opiates, benzo's).  He presented to [facility] ED 01/22 with increase in quantity of mechanical falls as well as chest pain.  He drinks roughly 1 pint of liquor per day In ED, he complained of back and knee pain due to his falls as well as left sided chest pain that was sharp in nature.  Pain was exacerbated by deep inspiration and positional changes and was relieved by staying still or using pain meds.  Girlfriend also reported confusion as well as memory loss over the previous 2 days.  He has had admissions in the past related to ETOH withdrawal as well as ETOH withdrawal seizures.  He was admitted for ETOH intoxication as well as NSTEMI thought to be due to cocaine induced ischemia vs NSTEMI.  SIGNIFICANT EVENTS: 01/22 > admitted with ETOH withdrawal. 01/23 > PCCM consulted for transfer to ICU for initiation of precedex. Required intubation for respiratory insufficiency.  STUDIES:  CT Head w/o 1/22: No acute intracranial process. Port CXR 1/23:  ETT 5cm above carina. Patchy airspace opacity bilateral bases. No effusion. TTE 1/24:  Pending  MICROBIOLOGY: Tracheal Asp Ctx 1/23 >> MRSA nares 1/23:  Negative Urine Strep Antigen 1/24: Negative Urine Legionella Antigen 1/24:  Pending  ANTIBIOTICS: Unasyn 1/23 >>  LINES/TUBES: OETT 8.0 1/23>> Foley 1/23>> PIV x2  SUBJECTIVE:  Somnolent, but awake and alert while intubated. Denies pain, headache. Intubated last night due to respiratory insufficiency.  REVIEW OF SYSTEMS:  Unobtainable as patient is intubated.  VITAL SIGNS: Temp:  [97.5 F (36.4 C)-99.8 F (37.7 C)] 99 F (37.2 C) (01/24 0730) Pulse Rate:  [50-97] 65 (01/24 0730) Resp:  [19-50] 22  (01/24 0730) BP: (94-176)/(43-150) 149/95 mmHg (01/24 0730) SpO2:  [85 %-100 %] 95 % (01/24 0730) FiO2 (%):  [60 %-100 %] 60 % (01/24 0340) Weight:  [203 lb 4.2 oz (92.2 kg)] 203 lb 4.2 oz (92.2 kg) (01/23 1319)  PHYSICAL EXAMINATION: General: Adult male, resting in bed, intubated. Neuro: Somnolent, but alert. Opens eyes, shakes head yes/no HEENT: [place]/AT. PERRL, sclerae anicteric. Cardiovascular: distant sounds, RRR, no M/R/G.  Lungs: CTA bilaterally, no wheezing, rales, rhonchi Abdomen: hypoactive bowel sounds, NT/ND.  Musculoskeletal: No gross deformities, no edema. SCDs in place. Mittens in place. Skin: Intact, warm, no rashes.  Vent Mode:  [-] PRVC FiO2 (%):  [60 %-100 %] 60 % Set Rate:  [22 bmp] 22 bmp Vt Set:  [580 mL] 580 mL PEEP:  [5 cmH20] 5 cmH20 Plateau Pressure:  [15 cmH20-19 cmH20] 15 cmH20    Recent Labs Lab 01/10/16 1712 01/12/16 0219  NA 140 137  K 4.1 3.0*  CL 104 102  CO2 25 22  BUN 10 9  CREATININE 0.98 0.91  GLUCOSE 101* 152*    Recent Labs Lab 01/10/16 1712 01/12/16 0219  HGB 13.9 13.5  HCT 39.9 38.7*  WBC 7.5 8.1  PLT 193 119*   Dg Chest 2 View  01/10/2016  CLINICAL DATA:  Left lower rib pain.  Fell on Wednesday. EXAM: CHEST  2 VIEW COMPARISON:  04/02/2015 FINDINGS: The heart is enlarged but stable. There is tortuosity of the thoracic aorta. Patchy bibasilar atelectasis or infiltrates. No effusions or edema. Possible nondisplaced  lower left rib fractures. No pneumothorax or obvious pleural effusion. IMPRESSION: Cardiac enlargement and bibasilar infiltrates or atelectasis. Possible lower left rib fractures. Electronically Signed   By: Marijo Sanes M.D.   On: 01/10/2016 18:33   Dg Ribs Unilateral W/chest Left  01/11/2016  CLINICAL DATA:  Fall several days ago with rib fracture. EXAM: LEFT RIBS AND CHEST - 3+ VIEW COMPARISON:  01/10/2016 chest x-ray FINDINGS: 1540 hours. Frontal chest radiograph shows focal airspace disease in the right mid lung  and left base suspicious for pneumonia. Cardiopericardial silhouette is at upper limits of normal for size. Telemetry leads overlie the chest. Oblique films show no evidence for left lower rib fracture as suggested on the previous chest x-ray. IMPRESSION: Bilateral airspace disease, suspicious for multifocal pneumonia. Follow-up imaging is recommended to ensure resolution. Electronically Signed   By: Misty Stanley M.D.   On: 01/11/2016 15:59   Ct Head Wo Contrast  01/10/2016  CLINICAL DATA:  Multiple falls.  Alcohol abuse. EXAM: CT HEAD WITHOUT CONTRAST TECHNIQUE: Contiguous axial images were obtained from the base of the skull through the vertex without intravenous contrast. COMPARISON:  04/02/2015 FINDINGS: There is no evidence of intracranial hemorrhage, brain edema, or other signs of acute infarction. There is no evidence of intracranial mass lesion or mass effect. No abnormal extraaxial fluid collections are identified. Mild cerebral atrophy and chronic small vessel disease is stable in appearance. Ventricles are stable in size. No evidence of skull fracture. Chronic bilateral maxillary sinusitis again noted. IMPRESSION: No acute intracranial abnormality. Stable mild cerebral atrophy and chronic small vessel disease. Chronic bilateral maxillary sinusitis. Electronically Signed   By: Earle Gell M.D.   On: 01/10/2016 18:23   Dg Chest Port 1 View  01/11/2016  CLINICAL DATA:  Status post intubation today. EXAM: PORTABLE CHEST 1 VIEW COMPARISON:  Single view of the chest earlier today. FINDINGS: Endotracheal tube is in place the tip in good position 4.8 cm above the carina. Patchy airspace disease in the right chest and airspace opacity in the left lung base again seen. No pneumothorax or pleural effusion. Heart size is upper normal. IMPRESSION: ET tube in good position. No marked change in bilateral airspace disease, worse on the right. Electronically Signed   By: Inge Rise M.D.   On: 01/11/2016 22:56    Dg Abd Portable 1v  01/11/2016  CLINICAL DATA:  Status post NG tube placement.  Initial encounter. EXAM: PORTABLE ABDOMEN - 1 VIEW COMPARISON:  None. FINDINGS: NG tube is in place with the side-port in the stomach in good position. IMPRESSION: As above. Electronically Signed   By: Inge Rise M.D.   On: 01/11/2016 22:55    ASSESSMENT / PLAN:  NEUROLOGIC A:   Acute Encephalopathy - Likely secondary to withdrawal.  ETOH withdrawal Polysubstance abuse - UDS positive for cocaine, opiates, benzo's. H/o Depression H/O Meniere's disease with hearing loss H/O CVA H/O Insomnia.  P:   Goal RASS:  0 to -1 Stopping Precedex with bradycardia PRN IV Versed Fentanyl gtt Continue thiamine / folate / multivitamin. Substance and ETOH abuse counseling once extubated Hold outpatient alprazolam, bupropion, chlordiazepoxide, citalopram, norco.  PULMONARY A: Acute Hypoxic Respiratory Failure - Likely secondary to aspiration. Aspiration Pneumonia vs Pneumonitis Tobacco use disorder  P:   Full Vent Support Nicotine Patch 21mg  CXR in AM   CARDIOVASCULAR A:  NSTEMI - Possible cocaine induced ischemia Questionable EtOH Induced Cardiomyopathy H/O CAD H/O HTN.   P:  Cardiology following & appreciate recommendations Continue heparin  drip Continue ASA Continue home dose carvedilol, hold for HR <60, SBP <110 Hydralazine for SBP > 170, DBP > 110 TTE Pending  RENAL A:   Hypokalemia Hypomagnesemia Hypophosphatemia  P: Monitor metabolic panel   Correct electrolytes as indicated. K, Mg, Phos replaced by Elink, f/u this afternoon Trending UOP with foley Monitoring renal function with daily BUN/Creatinine  GASTROINTESTINAL A:   Fatty Liver Disease - Likely secondary to EtOH use.  P:   NPO for now Consider tube feeds if no extubation in the next couple of days Protonix IV daily  HEMATOLOGIC / ONCOLOGIC A:   Macrocytosis - due to ETOH, Folate normal, B12 low normal H/o  prostate CA - s/p XRT.  P:  SCD's  Heparin gtt. Monitor Hgb daily with CBC  INFECTIOUS A:   Aspiration Pneumonia  P:   Unasyn Day #2 Awaiting final respiratory culture  ENDOCRINE A:   No acute issues.    P:   Monitor glucose with daily labs.   Family updated: Girlfriend updated by Dr. Lamonte Sakai 1/23  Interdisciplinary Family Meeting v [place]:  Due by: 01/17/16.   Zada Finders, MD Internal Medicine PGY1 Pager: [phone]  PCCM Attending Note: Patient seen and examined with resident physician. Please refer to his progress note which I reviewed in detail. 70 year old male with known history of alcohol abuse as well as cocaine on his urine drug screen. Presenting with non-ST elevation MI and acute hypoxic respiratory failure likely secured aspiration from withdrawal and altered mentation. Continuing full ventilator support. Continuing Unasyn empirically for antibiotic coverage will awaiting finalization of respiratory culture. Appreciate recommendations from cardiology. Patient continuing on heparin drip and aspirin. Holding Precedex given its perceived effects with regards to bradycardia and continuing patient on home dose Coreg with hold parameters. Can likely titrate up Coreg depending upon heart rate.  Plan:  1. Non-STEMI: Continuing heparin drip. Awaiting results of echocardiogram. Cardiology following. Patient continuing on aspirin 81 mg & Coreg. Can likely titrate up Coreg if heart rate improves off Precedex. 2. Acute hypoxic respiratory failure: Likely secondary to aspiration pneumonia. Continuing full vent support. 3. Aspiration pneumonia: Empiric Unasyn day #2. Awaiting finalization of tracheal aspirate culture. 4. Polysubstance/alcohol abuse: Continuing by mouth thiamine, folic acid, and multivitamin. Nicotine patch. Plan for substance abuse/tobacco cessation education upon extubation. Versed IV when necessary. 5. Prophylaxis: Protonix IV daily & heparin  drip with SCDs.  I spent a total of 34 minutes of critical care time today independent of time spent by resident physician caring for the patient and reviewing the patient's electronic medical record.  Sonia Baller Ashok Cordia, M.D. [facility] Pager:  [phone] After 3pm or if no response, call [phone]

## 2016-01-12 NOTE — Clinical Social Work Note (Signed)
CSW following patient to assess for substance abuse and provide resources. Patient currently intubated, will attempt to reassess at a more appropriate time.   Liz Beach MSW, [place], [place], [id number]

## 2016-01-12 NOTE — Progress Notes (Signed)
ANTICOAGULATION CONSULT NOTE - Follow Up Consult  Pharmacy Consult for heparin Indication: chest pain/ACS  Allergies  Allergen Reactions  . Celebrex [Celecoxib] Rash  . Cymbalta [Duloxetine Hcl] Other (See Comments)    Did not work   . Lexapro [Escitalopram] Other (See Comments)    Did not work   . Trazodone And Nefazodone Other (See Comments)    Did not work   . Wellbutrin [Bupropion] Other (See Comments)    Did not work     Patient Measurements: Height: 5\' 10"  (177.8 cm) Weight: 203 lb 4.2 oz (92.2 kg) IBW/kg (Calculated) : 73  Vital Signs: Temp: 101.1 F (38.4 C) (01/24 2006) Temp Source: Oral (01/24 2006) BP: 85/68 mmHg (01/24 2030) Pulse Rate: 78 (01/24 2030)  Labs:  Recent Labs  01/10/16 1712 01/10/16 2322 01/11/16 0236 01/11/16 0528 01/11/16 1200  01/12/16 0219 01/12/16 1250 01/12/16 1405 01/12/16 1937  HGB 13.9  --   --   --   --   --  13.5  --   --   --   HCT 39.9  --   --   --   --   --  38.7*  --   --   --   PLT 193  --   --   --   --   --  119*  --   --   --   LABPROT  --   --   --   --  16.6*  --   --   --   --   --   INR  --   --   --   --  1.33  --   --   --   --   --   HEPARINUNFRC  --   --   --   --   --   < > 0.10* 0.23*  --  0.29*  CREATININE 0.98  --   --   --   --   --  0.91  --  0.79  --   CKTOTAL 47*  --   --  82  --   --   --   --   --   --   CKMB 1.3  --   --   --   --   --   --   --   --   --   TROPONINI 4.59* 4.53* 4.35* 4.46*  --   --   --   --   --   --   < > = values in this interval not displayed.  Estimated Creatinine Clearance: 100.9 mL/min (by C-G formula based on Cr of 0.79).  Assessment: 70 year old male admitted 01/10/2016 chest pain/ACS. Pharmacy consulted to start heparin. Patient has been sub-therapeutic on heparin since initiation. Holding off on left cardiac cath until patient more stable.  HL 0.29 (sub-therapeutic) on heparin at 1800 units/hr.  No s/sx of bleeding noted.  Goal of Therapy:  Heparin level 0.3-0.7  units/ml Monitor platelets by anticoagulation protocol: Yes   Plan:  - Increase heparin drip to 2000 units/hr - Check 6 hr HL - Monitor daily HL, CBC and s/sx of bleeding   [employer], Pharm.D., BCPS Clinical Pharmacist Pager [phone] 01/12/2016 8:54 PM

## 2016-01-12 NOTE — Progress Notes (Signed)
Initial Nutrition Assessment  DOCUMENTATION CODES:   Not applicable  INTERVENTION:    If unable to extubate within the next 24 hours, recommend initiate TF via OGT with Vital AF 1.2 at 25 ml/h and Prostat 30 ml BID on day 1; on day 2, d/c Prostat and increase to goal rate of 70 ml/h (1680 ml per day) to provide 2016 kcals, 126 gm protein, 1362 ml free water daily.  NUTRITION DIAGNOSIS:   Inadequate oral intake related to inability to eat as evidenced by NPO status.  GOAL:   Patient will meet greater than or equal to 90% of their needs  MONITOR:   Vent status, Labs, Weight trends, I & O's  REASON FOR ASSESSMENT:   Ventilator    ASSESSMENT:   70 y.o. male with ETOH use and polysubstance abuse (UDS positive for cocaine, opiates, benzo's). He [facility] ED 01/22 with increase in quantity of mechanical falls as well as chest pain. He drinks roughly 1 pint of liquor per day. He was admitted for ETOH intoxication as well as NSTEMI thought to be due to cocaine induced ischemia vs NSTEMI.  Discussed patient in ICU rounds and with RN today. No plans to start TF today, may start TF tomorrow. Nutrition focused physical exam completed.  No muscle or subcutaneous fat depletion noticed.  Patient is currently intubated on ventilator support MV: 12.5 L/min Temp (24hrs), Avg:98.4 F (36.9 C), Min:97.5 F (36.4 C), Max:99 F (37.2 C)   Diet Order:  Diet NPO time specified  Skin:  Reviewed, no issues  Last BM:  1/22  Height:   Ht Readings from Last 1 Encounters:  01/11/16 5\' 10"  (1.778 m)    Weight:   Wt Readings from Last 1 Encounters:  01/11/16 203 lb 4.2 oz (92.2 kg)    Ideal Body Weight:  75.5 kg  BMI:  Body mass index is 29.17 kg/(m^2).  Estimated Nutritional Needs:   Kcal:  2023  Protein:  115-130 gm  Fluid:  2-2.2 L  EDUCATION NEEDS:   No education needs identified at this time  Molli Barrows, [place], [place], [place] Pager [phone] After Hours Pager  [phone]

## 2016-01-12 NOTE — Progress Notes (Signed)
ANTICOAGULATION CONSULT NOTE - Follow Up Consult  Pharmacy Consult for heparin Indication: chest pain/ACS  Allergies  Allergen Reactions  . Celebrex [Celecoxib] Rash  . Cymbalta [Duloxetine Hcl] Other (See Comments)    Did not work   . Lexapro [Escitalopram] Other (See Comments)    Did not work   . Trazodone And Nefazodone Other (See Comments)    Did not work   . Wellbutrin [Bupropion] Other (See Comments)    Did not work     Patient Measurements: Height: 5\' 10"  (177.8 cm) Weight: 203 lb 4.2 oz (92.2 kg) IBW/kg (Calculated) : 73  Vital Signs: Temp: 98.9 F (37.2 C) (01/24 1144) Temp Source: Axillary (01/24 1144) BP: 128/87 mmHg (01/24 1200) Pulse Rate: 63 (01/24 1200)  Labs:  Recent Labs  01/10/16 1712 01/10/16 2322 01/11/16 0236 01/11/16 0528 01/11/16 1200 01/11/16 1853 01/12/16 0219 01/12/16 1250  HGB 13.9  --   --   --   --   --  13.5  --   HCT 39.9  --   --   --   --   --  38.7*  --   PLT 193  --   --   --   --   --  119*  --   LABPROT  --   --   --   --  16.6*  --   --   --   INR  --   --   --   --  1.33  --   --   --   HEPARINUNFRC  --   --   --   --   --  <0.10* 0.10* 0.23*  CREATININE 0.98  --   --   --   --   --  0.91  --   CKTOTAL 47*  --   --  82  --   --   --   --   CKMB 1.3  --   --   --   --   --   --   --   TROPONINI 4.59* 4.53* 4.35* 4.46*  --   --   --   --     Estimated Creatinine Clearance: 88.7 mL/min (by C-G formula based on Cr of 0.91).  Assessment: 70 year old male admitted 01/10/2016 chest pain/ACS. Pharmacy consulted to start heparin. Patient has been sub-therapeutic on heparin since initiation. Holding off on left cardiac cath until patient more stable.  HL 0.23 (sub-therapeutic), H/H stable, Plt 193>119. Will watch platelets closely. No s/sx of bleeding noted.  Goal of Therapy:  Heparin level 0.3-0.7 units/ml Monitor platelets by anticoagulation protocol: Yes   Plan:  - Bolus heparin 1400 units - Increase heparin drip to  1800 units/hr - Check 6 hr HL - Monitor daily HL, CBC and s/sx of bleeding  Dimitri Ped, PharmD. PGY-1 Pharmacy Resident Pager: [phone]  01/12/2016,1:42 PM

## 2016-01-12 NOTE — Progress Notes (Addendum)
[place] Progress Note Patient Name: Joshua Henson DOB: 27-Nov-1946 MRN: [id number]   Date of Service  01/12/2016  HPI/Events of Note  K+ = 3.0, PO4--- = 1.7, Mg++ = 1.6 and Creatinine = 0.4.  eICU Interventions  Will replete K+, Mg++ and PO4---.     Intervention Category Major Interventions: Electrolyte abnormality - evaluation and management  Sommer,Steven Eugene 01/12/2016, 6:32 AM

## 2016-01-12 NOTE — Progress Notes (Signed)
  Echocardiogram 2D Echocardiogram with Definity has been performed.  Tresa Res 01/12/2016, 1:07 PM

## 2016-01-12 NOTE — Progress Notes (Signed)
ANTICOAGULATION CONSULT NOTE - Follow Up Consult  Pharmacy Consult for Heparin  Indication: chest pain/ACS  Allergies  Allergen Reactions  . Celebrex [Celecoxib] Rash  . Cymbalta [Duloxetine Hcl] Other (See Comments)    Did not work   . Lexapro [Escitalopram] Other (See Comments)    Did not work   . Trazodone And Nefazodone Other (See Comments)    Did not work   . Wellbutrin [Bupropion] Other (See Comments)    Did not work     Patient Measurements: Height: 5\' 10"  (177.8 cm) Weight: 203 lb 4.2 oz (92.2 kg) IBW/kg (Calculated) : 73  Vital Signs: Temp: 98.8 F (37.1 C) (01/24 0350) Temp Source: Oral (01/24 0350) BP: 157/96 mmHg (01/24 0340) Pulse Rate: 63 (01/24 0340)  Labs:  Recent Labs  01/10/16 1712 01/10/16 2322 01/11/16 0236 01/11/16 0528 01/11/16 1200 01/11/16 1853 01/12/16 0219  HGB 13.9  --   --   --   --   --  13.5  HCT 39.9  --   --   --   --   --  38.7*  PLT 193  --   --   --   --   --  119*  LABPROT  --   --   --   --  16.6*  --   --   INR  --   --   --   --  1.33  --   --   HEPARINUNFRC  --   --   --   --   --  <0.10* 0.10*  CREATININE 0.98  --   --   --   --   --  0.91  CKTOTAL 47*  --   --  82  --   --   --   CKMB 1.3  --   --   --   --   --   --   TROPONINI 4.59* 4.53* 4.35* 4.46*  --   --   --     Estimated Creatinine Clearance: 88.7 mL/min (by C-G formula based on Cr of 0.91).  Assessment: Heparin level still sub-therapeutic despite rate increase, noted drop in plts (193>>119)  Goal of Therapy:  Heparin level 0.3-0.7 units/ml Monitor platelets by anticoagulation protocol: Yes   Plan:  -Heparin 2000 units BOLUS -Increase heparin drip to 1650 units/hr -1200 HL -Watch platelets closely   Narda Bonds 01/12/2016,3:51 AM

## 2016-01-12 NOTE — Progress Notes (Signed)
Entered in error

## 2016-01-13 ENCOUNTER — Inpatient Hospital Stay (HOSPITAL_COMMUNITY): Payer: Medicare Other

## 2016-01-13 DIAGNOSIS — J15211 Pneumonia due to Methicillin susceptible Staphylococcus aureus: Secondary | ICD-10-CM

## 2016-01-13 LAB — RENAL FUNCTION PANEL
ALBUMIN: 2.6 g/dL — AB (ref 3.5–5.0)
ANION GAP: 11 (ref 5–15)
Albumin: 2.5 g/dL — ABNORMAL LOW (ref 3.5–5.0)
Anion gap: 11 (ref 5–15)
BUN: 18 mg/dL (ref 6–20)
BUN: 22 mg/dL — AB (ref 6–20)
CALCIUM: 8.2 mg/dL — AB (ref 8.9–10.3)
CHLORIDE: 99 mmol/L — AB (ref 101–111)
CO2: 21 mmol/L — ABNORMAL LOW (ref 22–32)
CO2: 24 mmol/L (ref 22–32)
Calcium: 7.6 mg/dL — ABNORMAL LOW (ref 8.9–10.3)
Chloride: 105 mmol/L (ref 101–111)
Creatinine, Ser: 1.4 mg/dL — ABNORMAL HIGH (ref 0.61–1.24)
Creatinine, Ser: 1.43 mg/dL — ABNORMAL HIGH (ref 0.61–1.24)
GFR calc Af Amer: 57 mL/min — ABNORMAL LOW (ref >60)
GFR, EST AFRICAN AMERICAN: 58 mL/min — AB (ref >60)
GFR, EST NON AFRICAN AMERICAN: 50 mL/min — AB (ref >60)
GFR, EST NON AFRICAN AMERICAN: 49 mL/min — AB (ref >60)
Glucose, Bld: 114 mg/dL — ABNORMAL HIGH (ref 65–99)
Glucose, Bld: 103 mg/dL — ABNORMAL HIGH (ref 65–99)
PHOSPHORUS: 3.3 mg/dL (ref 2.5–4.6)
POTASSIUM: 3.2 mmol/L — AB (ref 3.5–5.1)
POTASSIUM: 3.9 mmol/L (ref 3.5–5.1)
Phosphorus: 2.8 mg/dL (ref 2.5–4.6)
Sodium: 131 mmol/L — ABNORMAL LOW (ref 135–145)
Sodium: 140 mmol/L (ref 135–145)

## 2016-01-13 LAB — CBC WITH DIFFERENTIAL/PLATELET
Basophils Absolute: 0 10*3/uL (ref 0.0–0.1)
Basophils Relative: 0 %
Eosinophils Absolute: 0 10*3/uL (ref 0.0–0.7)
Eosinophils Relative: 0 %
HCT: 35.8 % — ABNORMAL LOW (ref 39.0–52.0)
HEMOGLOBIN: 12.6 g/dL — AB (ref 13.0–17.0)
LYMPHS ABS: 1.1 10*3/uL (ref 0.7–4.0)
LYMPHS PCT: 9 %
MCH: 36 pg — AB (ref 26.0–34.0)
MCHC: 35.2 g/dL (ref 30.0–36.0)
MCV: 102.3 fL — AB (ref 78.0–100.0)
Monocytes Absolute: 1 10*3/uL (ref 0.1–1.0)
Monocytes Relative: 9 %
NEUTROS ABS: 9.4 10*3/uL — AB (ref 1.7–7.7)
NEUTROS PCT: 82 %
Platelets: 197 10*3/uL (ref 150–400)
RBC: 3.5 MIL/uL — AB (ref 4.22–5.81)
RDW: 13.6 % (ref 11.5–15.5)
WBC: 11.5 10*3/uL — AB (ref 4.0–10.5)

## 2016-01-13 LAB — LEGIONELLA ANTIGEN, URINE

## 2016-01-13 LAB — MAGNESIUM: Magnesium: 2 mg/dL (ref 1.7–2.4)

## 2016-01-13 LAB — HEPARIN LEVEL (UNFRACTIONATED)
Heparin Unfractionated: 0.3 IU/mL (ref 0.30–0.70)
Heparin Unfractionated: 0.38 IU/mL (ref 0.30–0.70)

## 2016-01-13 MED ORDER — CARVEDILOL 6.25 MG PO TABS
6.2500 mg | ORAL_TABLET | Freq: Two times a day (BID) | ORAL | Status: DC
  Administered 2016-01-14 – 2016-01-15 (×4): 6.25 mg
  Filled 2016-01-13 (×8): qty 1

## 2016-01-13 MED ORDER — POTASSIUM CHLORIDE 20 MEQ/15ML (10%) PO SOLN
40.0000 meq | Freq: Once | ORAL | Status: AC
  Administered 2016-01-13: 40 meq
  Filled 2016-01-13: qty 30

## 2016-01-13 NOTE — Progress Notes (Signed)
ANTICOAGULATION CONSULT NOTE - Follow Up Consult  Pharmacy Consult for heparin Indication: chest pain/ACS  Allergies  Allergen Reactions  . Celebrex [Celecoxib] Rash  . Cymbalta [Duloxetine Hcl] Other (See Comments)    Did not work   . Lexapro [Escitalopram] Other (See Comments)    Did not work   . Trazodone And Nefazodone Other (See Comments)    Did not work   . Wellbutrin [Bupropion] Other (See Comments)    Did not work     Patient Measurements: Height: 5\' 10"  (177.8 cm) Weight: 203 lb 4.2 oz (92.2 kg) IBW/kg (Calculated) : 73  Vital Signs: Temp: 99.2 F (37.3 C) (01/25 1517) Temp Source: Oral (01/25 1517) BP: 128/83 mmHg (01/25 1600) Pulse Rate: 114 (01/25 1600)  Labs:  Recent Labs  01/10/16 1712 01/10/16 2322 01/11/16 0236 01/11/16 0528 01/11/16 1200  01/12/16 0219  01/12/16 1405 01/12/16 1937 01/13/16 0253 01/13/16 1155 01/13/16 1558  HGB 13.9  --   --   --   --   --  13.5  --   --   --  12.6*  --   --   HCT 39.9  --   --   --   --   --  38.7*  --   --   --  35.8*  --   --   PLT 193  --   --   --   --   --  119*  --   --   --  197  --   --   LABPROT  --   --   --   --  16.6*  --   --   --   --   --   --   --   --   INR  --   --   --   --  1.33  --   --   --   --   --   --   --   --   HEPARINUNFRC  --   --   --   --   --   < > 0.10*  < >  --  0.29* 0.30  --  0.38  CREATININE 0.98  --   --   --   --   --  0.91  --  0.79  --  1.40* 1.43*  --   CKTOTAL 47*  --   --  82  --   --   --   --   --   --   --   --   --   CKMB 1.3  --   --   --   --   --   --   --   --   --   --   --   --   TROPONINI 4.59* 4.53* 4.35* 4.46*  --   --   --   --   --   --   --   --   --   < > = values in this interval not displayed.  Estimated Creatinine Clearance: 56.4 mL/min (by C-G formula based on Cr of 1.43).   Assessment: 70 year old male admitted 01/10/2016 with chest pain/ACS. Pharmacy consulted to start heparin. Currently holding off on left cardiac cath until patient more  stable. HL therapeutic at 0.38 after rate increase when borderline therapeutic. No issues documented.  Goal of Therapy:  Heparin level 0.3-0.7 units/ml Monitor platelets by anticoagulation protocol: Yes   Plan:  Continue heparin gtt at 2,200 units/hr Monitor daily HL, CBC, s/s of bleed  Elenor Quinones, PharmD, [facility] Clinical Pharmacist Pager [phone] 01/13/2016 4:57 PM

## 2016-01-13 NOTE — Progress Notes (Signed)
Name: Joshua Henson MRN: [id number] DOB: [birth year]    ADMISSION DATE:  01/10/2016 CONSULTATION DATE:  01/11/16  REFERRING MD :  Karleen Hampshire  CHIEF COMPLAINT:  Chest pain, falls  Brief Summary:  Joshua Henson is a 70 y.o. male with ETOH use and polysubstance abuse (UDS positive for cocaine, opiates, benzo's).  He presented to [facility] ED 01/22 with increase in quantity of mechanical falls as well as chest pain.  He drinks roughly 1 pint of liquor per day In ED, he complained of back and knee pain due to his falls as well as left sided chest pain that was sharp in nature.  Pain was exacerbated by deep inspiration and positional changes and was relieved by staying still or using pain meds.  Girlfriend also reported confusion as well as memory loss over the previous 2 days.  He has had admissions in the past related to ETOH withdrawal as well as ETOH withdrawal seizures.  He was admitted for ETOH intoxication as well as NSTEMI thought to be due to cocaine induced ischemia vs NSTEMI.  SIGNIFICANT EVENTS: 01/22 > admitted with ETOH withdrawal. 01/23 > PCCM consulted for transfer to ICU for initiation of precedex. Required intubation for respiratory insufficiency.  STUDIES:  CT Head w/o 1/22: No acute intracranial process. Port CXR 1/23:  ETT 5cm above carina. Patchy airspace opacity bilateral bases. No effusion. TTE 1/24:  LV normal in size with mild LVH. EF 55%. Inadequate to assess wall motion. Grade 1 diastolic dysfunction. RV normal in size and function.  MICROBIOLOGY: Tracheal Asp Ctx 1/23:  Staph aureus w/ Pending Sensitivities MRSA nares 1/23:  Negative Urine Strep Antigen 1/24: Negative Urine Legionella Antigen 1/24:  Negative  ANTIBIOTICS: Unasyn 1/23 >>  LINES/TUBES: OETT 8.0 1/23>> Foley 1/23>> PIV x2  SUBJECTIVE:  Awake and alert while intubated. Denies pain, headache. No overnight issues.  REVIEW OF SYSTEMS:  Unobtainable as patient is intubated.  VITAL SIGNS: Temp:  [98.9 F  (37.2 C)-101.1 F (38.4 C)] 99 F (37.2 C) (01/25 0411) Pulse Rate:  [54-98] 71 (01/25 0630) Resp:  [19-26] 22 (01/25 0630) BP: (68-149)/(53-95) 103/73 mmHg (01/25 0630) SpO2:  [86 %-100 %] 96 % (01/25 0630) FiO2 (%):  [50 %-60 %] 50 % (01/25 0322)  PHYSICAL EXAMINATION: General: Adult male, resting in bed, intubated. Neuro: Awake, alert. Opens eyes, shakes head yes/no HEENT: [place]/AT. PERRL, sclerae anicteric. Cardiovascular: RRR, no M/R/G.  Lungs: CTA bilaterally, no wheezing, rales, rhonchi Abdomen: hypoactive bowel sounds, NT/ND.  Musculoskeletal: No gross deformities, no edema. SCDs in place. Mittens in place. Skin: Intact, warm, no rashes.  Vent Mode:  [-] PRVC FiO2 (%):  [50 %-60 %] 50 % Set Rate:  [22 bmp] 22 bmp Vt Set:  [580 mL] 580 mL PEEP:  [5 cmH20] 5 cmH20 Plateau Pressure:  [17 cmH20-22 cmH20] 22 cmH20    Recent Labs Lab 01/12/16 0219 01/12/16 1405 01/13/16 0253  NA 137 137 131*  K 3.0* 3.2* 3.2*  CL 102 102 99*  CO2 22 24 21*  BUN 9 10 18   CREATININE 0.91 0.79 1.40*  GLUCOSE 152* 142* 114*    Recent Labs Lab 01/10/16 1712 01/12/16 0219 01/13/16 0253  HGB 13.9 13.5 12.6*  HCT 39.9 38.7* 35.8*  WBC 7.5 8.1 11.5*  PLT 193 119* 197   Dg Ribs Unilateral W/chest Left  01/11/2016  CLINICAL DATA:  Fall several days ago with rib fracture. EXAM: LEFT RIBS AND CHEST - 3+ VIEW COMPARISON:  01/10/2016 chest x-ray FINDINGS: 1540  hours. Frontal chest radiograph shows focal airspace disease in the right mid lung and left base suspicious for pneumonia. Cardiopericardial silhouette is at upper limits of normal for size. Telemetry leads overlie the chest. Oblique films show no evidence for left lower rib fracture as suggested on the previous chest x-ray. IMPRESSION: Bilateral airspace disease, suspicious for multifocal pneumonia. Follow-up imaging is recommended to ensure resolution. Electronically Signed   By: Misty Stanley M.D.   On: 01/11/2016 15:59   Dg Chest  Port 1 View  01/11/2016  CLINICAL DATA:  Status post intubation today. EXAM: PORTABLE CHEST 1 VIEW COMPARISON:  Single view of the chest earlier today. FINDINGS: Endotracheal tube is in place the tip in good position 4.8 cm above the carina. Patchy airspace disease in the right chest and airspace opacity in the left lung base again seen. No pneumothorax or pleural effusion. Heart size is upper normal. IMPRESSION: ET tube in good position. No marked change in bilateral airspace disease, worse on the right. Electronically Signed   By: Inge Rise M.D.   On: 01/11/2016 22:56   Dg Abd Portable 1v  01/11/2016  CLINICAL DATA:  Status post NG tube placement.  Initial encounter. EXAM: PORTABLE ABDOMEN - 1 VIEW COMPARISON:  None. FINDINGS: NG tube is in place with the side-port in the stomach in good position. IMPRESSION: As above. Electronically Signed   By: Inge Rise M.D.   On: 01/11/2016 22:55    ASSESSMENT / PLAN:  NEUROLOGIC A:   Acute Encephalopathy - Likely secondary to withdrawal.  ETOH withdrawal Polysubstance abuse - UDS positive for cocaine, opiates, benzo's. H/o Depression H/O Meniere's disease with hearing loss H/O CVA H/O Insomnia.  P:   Goal RASS:  0  Versed gtt and Fentanyl gtt, wean as tolerated Continue thiamine / folate / multivitamin. Substance and ETOH abuse counseling once extubated Hold outpatient alprazolam, bupropion, chlordiazepoxide, citalopram, norco.  PULMONARY A: Acute Hypoxic Respiratory Failure - Likely secondary to aspiration. Aspiration Pneumonia vs Pneumonitis Tobacco use disorder  P:   Full Vent Support, wean as tolerated -> hopeful for extubation today SBT as tolerated Nicotine Patch 21mg  Unasyn   CARDIOVASCULAR A:  NSTEMI - Possible cocaine induced ischemia Questionable EtOH Induced Cardiomyopathy H/O CAD H/O HTN.   P:  Cardiology following & appreciate recommendations Continue heparin drip Continue ASA Continue home dose  carvedilol, hold for HR <60, SBP <110 Hydralazine for SBP > 170, DBP > 110   RENAL A:   Hypokalemia Hypomagnesemia Hypophosphatemia AKI  P: Monitor metabolic panel   Correct electrolytes as indicated. Trending UOP with foley Monitoring renal function with daily BUN/Creatinine K scheduled Consider IVF  GASTROINTESTINAL A:   Fatty Liver Disease - Likely secondary to EtOH use. P:   NPO for now Consider tube feeds if no extubation in the next couple of days Protonix IV daily Zofran prn  HEMATOLOGIC / ONCOLOGIC A:   Macrocytosis - due to ETOH, Folate normal, B12 low normal H/o prostate CA - s/p XRT.  P:  SCD's  Heparin gtt. Monitor Hgb daily with CBC  INFECTIOUS A:   Aspiration Pneumonia  P:   Unasyn Day #3 Awaiting final respiratory culture  ENDOCRINE A:   No acute issues.    P:   Monitor glucose with daily labs.   Zada Finders, MD Internal Medicine PGY1 Pager: [phone]  PCCM Attending Note: Patient seen and examined with resident physician. Please refer to his progress note which I reviewed in detail. 70 year old male with known history  of alcohol abuse as well as cocaine on his urine drug screen. Presenting with non-ST elevation MI and acute hypoxic respiratory failure likely secured aspiration from withdrawal and altered mentation. Improving clinically with regards to aspiration pneumonia. Given substantial improvement in oxygen requirement and mental status plan for extubation today. Awaiting cultures and sensitivities to Staphylococcus aureus and patient's sputum to further tailor antibiotics while continuing Unasyn for now.  Plan:  1. Non-STEMI: Continuing heparin drip. Awaiting results of echocardiogram. Cardiology following. Patient continuing on aspirin 81 mg & increasing Coreg.  2. Acute hypoxic respiratory failure: Likely secondary to aspiration pneumonia. Plan for extubation today. Incentive spirometry & flutter valve post  extubation. 3. Aspiration pneumonia: Empiric Unasyn day #3. Awaiting finalization of tracheal aspirate culture. 4. Polysubstance/alcohol abuse: Continuing thiamine, folic acid, and multivitamin. Nicotine patch. Plan for substance abuse/tobacco cessation education upon extubation.  5. Prophylaxis: Protonix IV daily & heparin drip with SCDs.  I spent a total of 32 minutes of critical care time today independent of time spent by resident physician caring for the patient and reviewing the patient's electronic medical record.  Sonia Baller Ashok Cordia, M.D. [place] Pager: [phone] After 3pm or if no response, call [phone]

## 2016-01-13 NOTE — Procedures (Signed)
Extubation Procedure Note  Patient Details:   Name: Joshua Henson DOB: Oct 02, 1946 MRN: [id number]   Airway Documentation:     Evaluation  O2 sats: stable throughout Complications: No apparent complications Patient did tolerate procedure well. Bilateral Breath Sounds: Clear, Diminished Suctioning: Airway Yes   Patient extubated to [place]. Vital signs stable at this time. No complications. RT will continue to monitor. RN and girlfriend at bedside.   Mcneil Sober 01/13/2016, 12:25 PM

## 2016-01-13 NOTE — Progress Notes (Signed)
ANTICOAGULATION CONSULT NOTE - Follow Up Consult  Pharmacy Consult for heparin Indication: chest pain/ACS  Allergies  Allergen Reactions  . Celebrex [Celecoxib] Rash  . Cymbalta [Duloxetine Hcl] Other (See Comments)    Did not work   . Lexapro [Escitalopram] Other (See Comments)    Did not work   . Trazodone And Nefazodone Other (See Comments)    Did not work   . Wellbutrin [Bupropion] Other (See Comments)    Did not work     Patient Measurements: Height: 5\' 10"  (177.8 cm) Weight: 203 lb 4.2 oz (92.2 kg) IBW/kg (Calculated) : 73  Vital Signs: Temp: 99 F (37.2 C) (01/25 0411) Temp Source: Oral (01/25 0411) BP: 98/79 mmHg (01/25 0700) Pulse Rate: 97 (01/25 0700)  Labs:  Recent Labs  01/10/16 1712 01/10/16 2322 01/11/16 0236 01/11/16 0528 01/11/16 1200  01/12/16 0219 01/12/16 1250 01/12/16 1405 01/12/16 1937 01/13/16 0253  HGB 13.9  --   --   --   --   --  13.5  --   --   --  12.6*  HCT 39.9  --   --   --   --   --  38.7*  --   --   --  35.8*  PLT 193  --   --   --   --   --  119*  --   --   --  197  LABPROT  --   --   --   --  16.6*  --   --   --   --   --   --   INR  --   --   --   --  1.33  --   --   --   --   --   --   HEPARINUNFRC  --   --   --   --   --   < > 0.10* 0.23*  --  0.29* 0.30  CREATININE 0.98  --   --   --   --   --  0.91  --  0.79  --  1.40*  CKTOTAL 47*  --   --  82  --   --   --   --   --   --   --   CKMB 1.3  --   --   --   --   --   --   --   --   --   --   TROPONINI 4.59* 4.53* 4.35* 4.46*  --   --   --   --   --   --   --   < > = values in this interval not displayed.  Estimated Creatinine Clearance: 57.6 mL/min (by C-G formula based on Cr of 1.4).   Assessment: 70 year old male admitted 01/10/2016 with chest pain/ACS. Pharmacy consulted to start heparin. Currently holding off on left cardiac cath until patient more stable.  HL 0.30 (therapeutic but low end of goal range), H/H stable, Plt wnl. No s/sx of bleeding noted.  Goal of  Therapy:  Heparin level 0.3-0.7 units/ml Monitor platelets by anticoagulation protocol: Yes   Plan:  - Increase heparin drip to 2200 units/hr - Monitor 6hr HL - Monitor daily HL, CBC and s/sx of bleeding  Dimitri Ped, PharmD. PGY-1 Pharmacy Resident Pager: [phone]  01/13/2016,7:24 AM

## 2016-01-14 LAB — RENAL FUNCTION PANEL
ALBUMIN: 2.2 g/dL — AB (ref 3.5–5.0)
Anion gap: 11 (ref 5–15)
BUN: 22 mg/dL — AB (ref 6–20)
CALCIUM: 8.5 mg/dL — AB (ref 8.9–10.3)
CO2: 25 mmol/L (ref 22–32)
CREATININE: 1.16 mg/dL (ref 0.61–1.24)
Chloride: 105 mmol/L (ref 101–111)
GFR calc Af Amer: >60 mL/min (ref >60)
GFR calc non Af Amer: >60 mL/min (ref >60)
GLUCOSE: 97 mg/dL (ref 65–99)
PHOSPHORUS: 2.1 mg/dL — AB (ref 2.5–4.6)
Potassium: 3.4 mmol/L — ABNORMAL LOW (ref 3.5–5.1)
SODIUM: 141 mmol/L (ref 135–145)

## 2016-01-14 LAB — CBC WITH DIFFERENTIAL/PLATELET
BASOS ABS: 0 10*3/uL (ref 0.0–0.1)
BASOS PCT: 0 %
EOS ABS: 0 10*3/uL (ref 0.0–0.7)
EOS PCT: 0 %
HEMATOCRIT: 34.4 % — AB (ref 39.0–52.0)
Hemoglobin: 12.1 g/dL — ABNORMAL LOW (ref 13.0–17.0)
Lymphocytes Relative: 9 %
Lymphs Abs: 0.9 10*3/uL (ref 0.7–4.0)
MCH: 37.2 pg — ABNORMAL HIGH (ref 26.0–34.0)
MCHC: 35.2 g/dL (ref 30.0–36.0)
MCV: 105.8 fL — ABNORMAL HIGH (ref 78.0–100.0)
MONO ABS: 1 10*3/uL (ref 0.1–1.0)
MONOS PCT: 9 %
NEUTROS ABS: 8.7 10*3/uL — AB (ref 1.7–7.7)
Neutrophils Relative %: 82 %
PLATELETS: 199 10*3/uL (ref 150–400)
RBC: 3.25 MIL/uL — ABNORMAL LOW (ref 4.22–5.81)
RDW: 13.5 % (ref 11.5–15.5)
WBC: 10.7 10*3/uL — ABNORMAL HIGH (ref 4.0–10.5)

## 2016-01-14 LAB — CULTURE, RESPIRATORY

## 2016-01-14 LAB — CULTURE, RESPIRATORY W GRAM STAIN

## 2016-01-14 LAB — HEPARIN LEVEL (UNFRACTIONATED)
HEPARIN UNFRACTIONATED: 0.3 [IU]/mL (ref 0.30–0.70)
HEPARIN UNFRACTIONATED: 0.26 [IU]/mL — AB (ref 0.30–0.70)

## 2016-01-14 LAB — MAGNESIUM: Magnesium: 2.1 mg/dL (ref 1.7–2.4)

## 2016-01-14 MED ORDER — ATORVASTATIN CALCIUM 40 MG PO TABS
40.0000 mg | ORAL_TABLET | Freq: Every day | ORAL | Status: DC
  Administered 2016-01-14 – 2016-01-18 (×5): 40 mg via ORAL
  Filled 2016-01-14 (×7): qty 1

## 2016-01-14 MED ORDER — FUROSEMIDE 10 MG/ML IJ SOLN
40.0000 mg | Freq: Once | INTRAMUSCULAR | Status: AC
  Administered 2016-01-14: 40 mg via INTRAVENOUS
  Filled 2016-01-14: qty 4

## 2016-01-14 MED ORDER — CITALOPRAM HYDROBROMIDE 20 MG PO TABS
20.0000 mg | ORAL_TABLET | Freq: Every day | ORAL | Status: DC
  Administered 2016-01-14 – 2016-01-18 (×5): 20 mg via ORAL
  Filled 2016-01-14 (×7): qty 1

## 2016-01-14 MED ORDER — POTASSIUM PHOSPHATES 15 MMOLE/5ML IV SOLN
40.0000 meq | Freq: Once | INTRAVENOUS | Status: AC
  Administered 2016-01-14: 40 meq via INTRAVENOUS
  Filled 2016-01-14: qty 9.09

## 2016-01-14 MED ORDER — ALPRAZOLAM 0.5 MG PO TABS
0.5000 mg | ORAL_TABLET | Freq: Two times a day (BID) | ORAL | Status: DC | PRN
  Administered 2016-01-14 – 2016-01-15 (×4): 0.5 mg via ORAL
  Filled 2016-01-14 (×4): qty 1

## 2016-01-14 MED ORDER — CEFAZOLIN SODIUM-DEXTROSE 2-3 GM-% IV SOLR
2.0000 g | Freq: Three times a day (TID) | INTRAVENOUS | Status: DC
  Administered 2016-01-14 – 2016-01-17 (×10): 2 g via INTRAVENOUS
  Filled 2016-01-14 (×14): qty 50

## 2016-01-14 MED ORDER — RESOURCE THICKENUP CLEAR PO POWD
ORAL | Status: DC | PRN
  Filled 2016-01-14: qty 125

## 2016-01-14 NOTE — Progress Notes (Signed)
Name: Joshua Henson MRN: [id number] DOB: September 18, 1946    ADMISSION DATE:  01/10/2016 CONSULTATION DATE:  01/11/16  REFERRING MD :  Karleen Hampshire  CHIEF COMPLAINT:  Chest pain, falls  Brief Summary:  Joshua Henson is a 70 y.o. male with ETOH use and polysubstance abuse (UDS positive for cocaine, opiates, benzo's).  He presented to [facility] ED 01/22 with increase in quantity of mechanical falls as well as chest pain.  He drinks roughly 1 pint of liquor per day In ED, he complained of back and knee pain due to his falls as well as left sided chest pain that was sharp in nature.  Pain was exacerbated by deep inspiration and positional changes and was relieved by staying still or using pain meds.  Girlfriend also reported confusion as well as memory loss over the previous 2 days.  He has had admissions in the past related to ETOH withdrawal as well as ETOH withdrawal seizures.  He was admitted for ETOH intoxication as well as NSTEMI thought to be due to cocaine induced ischemia vs NSTEMI.  SIGNIFICANT EVENTS: 01/22 > admitted with ETOH withdrawal. 01/23 > PCCM consulted for transfer to ICU for initiation of precedex. Required intubation for respiratory insufficiency. 01/25 > extubated  STUDIES:  CT Head w/o 1/22: No acute intracranial process. Port CXR 1/23:  ETT 5cm above carina. Patchy airspace opacity bilateral bases. No effusion. TTE 1/24:  LV normal in size with mild LVH. EF 55%. Inadequate to assess wall motion. Grade 1 diastolic dysfunction. RV normal in size and function.  MICROBIOLOGY: Tracheal Asp Ctx 1/23:  MSSA MRSA nares 1/23:  Negative Urine Strep Antigen 1/24: Negative Urine Legionella Antigen 1/24:  Negative  ANTIBIOTICS: Unasyn 1/23 >> 1/26 Cefazolin 1/26 >>  LINES/TUBES: OETT 8.0 1/23>> 1/25 Foley 1/23>> 1/25 PIV x2  SUBJECTIVE:  Awake and alert, sitting in a chair. He wants to know when he can "get out of here" to go back to work. He feels his breathing is better. He denies any  bowel movements.   VITAL SIGNS: Temp:  [97.9 F (36.6 C)-99.2 F (37.3 C)] 97.9 F (36.6 C) (01/26 0752) Pulse Rate:  [72-114] 79 (01/26 1100) Resp:  [13-23] 17 (01/26 1100) BP: (104-154)/(65-107) 124/96 mmHg (01/26 1100) SpO2:  [82 %-98 %] 94 % (01/26 1100) FiO2 (%):  [31 %] 31 % (01/26 0226)  PHYSICAL EXAMINATION: General: Adult male, sitting in chair, no acute distress Neuro: AAO x 3 HEENT: [place]/AT. PERRL, sclerae anicteric. Cardiovascular: RRR, no M/R/G.  Lungs: Crackles at right lung base, good respiratory effort Abdomen: soft, NT/ND, +BS Musculoskeletal: No gross deformities, no edema.  Skin: Intact, warm, no rashes.  Vent Mode:  [-]  FiO2 (%):  [31 %] 31 %    Recent Labs Lab 01/13/16 0253 01/13/16 1155 01/14/16 0332  NA 131* 140 141  K 3.2* 3.9 3.4*  CL 99* 105 105  CO2 21* 24 25  BUN 18 22* 22*  CREATININE 1.40* 1.43* 1.16  GLUCOSE 114* 103* 97    Recent Labs Lab 01/12/16 0219 01/13/16 0253 01/14/16 0332  HGB 13.5 12.6* 12.1*  HCT 38.7* 35.8* 34.4*  WBC 8.1 11.5* 10.7*  PLT 119* 197 199   Dg Chest Port 1 View  01/13/2016  CLINICAL DATA:  Intubation. EXAM: PORTABLE CHEST 1 VIEW COMPARISON:  01/11/2016. FINDINGS: Endotracheal tube and NG tube in stable position. Stable cardiomegaly. Persistent right upper lobe infiltrate without significant interim change. Low lung volumes with bibasilar atelectasis and/or infiltrates. No pleural effusion  or pneumothorax. No acute osseous abnormality. IMPRESSION: 1. Lines and tubes in stable position. 2. Persistent right upper lobe infiltrate without significant change. Low lung volumes with bibasilar atelectasis and/or mild infiltrates again noted. 3. Stable cardiomegaly. Electronically Signed   By: Marcello Moores  Register   On: 01/13/2016 07:16    ASSESSMENT / PLAN:  NSTEMI - Possible cocaine induced ischemia with questionable EtOH Induced Cardiomyopathy  Cardiology following & appreciate assistance Continue heparin  drip Continue Aspirin 81 mg Increased home dose carvedilol to 6.25 mg BID, hold for HR <60, SBP <110 Hydralazine prn for SBP > 170, DBP > 110    Acute Hypoxic Respiratory Failure Requiring Intubation - Likely secondary to aspiration.   Extubated 1/25 - Breathing well on room air Incentive Spirometry, Flutter valve SLP evaluated post-extubation -> recommend Dysphagia 3 diet (may need to make NPO if proceeding with cath)   Aspiration Pneumonia    Tracheal aspirate growing Methicillin Sensitive Staph aureus Unasyn Day #4 -> d/c Start Cefazolin for total treatment duration of 10 days Judicious antibiotic use in patient with history of C. diff    Acute Encephalopathy - Likely secondary to withdrawal.  Polysubstance abuse - UDS positive for cocaine, opiates, benzo's. Tobacco use  Xanax added overnight 0.5 mg prn for anxiety, sleep Added back home Citalopram Continue thiamine / folate / multivitamin. Nicotine Patch 21mg  Substance and ETOH abuse counseling Hold outpatient bupropion, chlordiazepoxide, norco. Consult PT    AKI -> improving Hypokalemia Hypomagnesemia -> improved Hypophosphatemia  Monitor metabolic panel   Correct electrolytes as indicated. Monitoring renal function with daily BUN/Creatinine Replacing K and Phos   Prophylaxis: Heparin drip SCDs  Dispo: Transfer to Telemetry  Zada Finders, MD Internal Medicine PGY1 Pager: [phone]  PCCM Attending Note: Patient seen and examined with resident physician. Please refer to his progress note which I reviewed in detail. 70 year old male with known history of alcohol abuse as well as cocaine on his urine drug screen. Presenting with non-ST elevation MI and acute hypoxic respiratory failure likely secured aspiration from withdrawal and altered mentation. Patient reports cough persists and is intimately productive. Reports dyspnea continues to improve. Denies any chest pain or pressure at present. Denies any  nausea or vomiting. Patient remains confused as to why he is in the hospital.  BP 124/96 mmHg  Pulse 79  Temp(Src) 99 F (37.2 C) (Oral)  Resp 17  Ht 5\' 10"  (1.778 m)  Wt 203 lb 4.2 oz (92.2 kg)  BMI 29.17 kg/m2  SpO2 94% Gen.: Patient sitting up in chair. No distress. Watching TV. Integument: Warm and dry. No rash on exposed skin. Pulmonary: Improving bilateral basilar aeration. Normal work of breathing on nasal cannula oxygen. Speaking in complete sentences. Cardiovascular: Regular rate. Normal S1 & S2. Neurological: Patient notes he is in the hospital and that it  2017. Not aware of the president.  CBC Latest Ref Rng 01/14/2016 01/13/2016 01/12/2016  WBC 4.0 - 10.5 K/uL 10.7(H) 11.5(H) 8.1  Hemoglobin 13.0 - 17.0 g/dL 12.1(L) 12.6(L) 13.5  Hematocrit 39.0 - 52.0 % 34.4(L) 35.8(L) 38.7(L)  Platelets 150 - 400 K/uL 199 197 119(L)    BMP Latest Ref Rng 01/14/2016 01/13/2016 01/13/2016  Glucose 65 - 99 mg/dL 97 103(H) 114(H)  BUN 6 - 20 mg/dL 22(H) 22(H) 18  Creatinine 0.61 - 1.24 mg/dL 1.16 1.43(H) 1.40(H)  Sodium 135 - 145 mmol/L 141 140 131(L)  Potassium 3.5 - 5.1 mmol/L 3.4(L) 3.9 3.2(L)  Chloride 101 - 111 mmol/L 105 105 99(L)  CO2  22 - 32 mmol/L 25 24 21(L)  Calcium 8.9 - 10.3 mg/dL 8.5(L) 8.2(L) 7.6(L)    Assessment/Plan:  1. Non-STEMI: Continuing heparin drip.Cardiology following. No focal wall motion abnormality on echocardiogram but poor quality. Patient continuing on aspirin 81 mg & Coreg 6.25 mg by mouth twice a day.  2. Acute hypoxic respiratory failure: Likely secondary to aspiration pneumonia.Extubated 1/25. Incentive spirometry & flutter valve post extubation. 3.  MSSA Aspiration pneumonia:Currently on day #4/10 of empiric antibiotics. Transitioning from Unasyn to Ancef given sensitivities. 4. Polysubstance/alcohol abuse: Continuing thiamine, folic acid, and multivitamin. Nicotine patch. Plan for substance abuse/tobacco cessation education upon extubation.   5. Altered mental status: Likely multifactorial given his history of alcohol use and recent sedation. Continuing thiamine and folic acid.  6. History of anxiety: Continuing Xanax by mouth twice a day when necessary.  7. Prophylaxis: Protonix IV daily & heparin drip with SCDs. 8. Disposition: Transitioning patient to medical telemetry bed. Hospitalist service to assume care and PCCM will sign off on 1/27.  Sonia Baller Ashok Cordia, M.D. [place] [facility] Pager: [phone] After 3pm or if no response, call [phone]

## 2016-01-14 NOTE — Progress Notes (Signed)
Wasted 100 fent in the sink with Press photographer

## 2016-01-14 NOTE — Progress Notes (Signed)
ANTICOAGULATION CONSULT NOTE - Follow Up Consult  Pharmacy Consult for heparin Indication: chest pain/ACS  Allergies  Allergen Reactions  . Celebrex [Celecoxib] Rash    Patient Measurements: Height: 5\' 10"  (177.8 cm) Weight: 203 lb 4.2 oz (92.2 kg) IBW/kg (Calculated) : 73  Heparin dosing weight: 91.5 kg Vital Signs: Temp: 98.2 F (36.8 C) (01/26 1526) Temp Source: Oral (01/26 1526) BP: 131/75 mmHg (01/26 1600) Pulse Rate: 73 (01/26 1600)  Labs:  Recent Labs  01/12/16 0219  01/13/16 0253 01/13/16 1155 01/13/16 1558 01/14/16 0332 01/14/16 1501  HGB 13.5  --  12.6*  --   --  12.1*  --   HCT 38.7*  --  35.8*  --   --  34.4*  --   PLT 119*  --  197  --   --  199  --   HEPARINUNFRC 0.10*  < > 0.30  --  0.38 0.30 0.26*  CREATININE 0.91  < > 1.40* 1.43*  --  1.16  --   < > = values in this interval not displayed.  Estimated Creatinine Clearance: 69.6 mL/min (by C-G formula based on Cr of 1.16).   Assessment: 70 year old male on IV heparin for ACS.  Currently holding off on left cardiac cath until patient more stable.  Repeat heparin level low at 0.26 despite increase to 2300 units/hr.    Goal of Therapy:  Heparin level 0.3-0.7 units/ml Monitor platelets by anticoagulation protocol: Yes   Plan:  - Increase heparin drip to 2450 units/hr - Monitor daily HL, CBC and s/sx of bleeding  Sloan Leiter, PharmD, BCPS Clinical Pharmacist [phone]  01/14/2016,4:18 PM

## 2016-01-14 NOTE — Progress Notes (Signed)
Patient Name: Joshua Henson Date of Encounter: 01/14/2016  Active Problems:   Elevated troponin   Chest pain   CAD (coronary artery disease), native coronary artery   Alcohol abuse with intoxication ([place])   Frequent falls   Left rib fracture   Acute encephalopathy   Polysubstance abuse   Delirium tremens ([place])   NSTEMI (non-ST elevated myocardial infarction) ([place])   Sepsis due to Enterococcus with acute renal failure and metabolic encephalopathy ([place])   Acute respiratory failure with hypoxia ([place])   Length of Stay: 3  SUBJECTIVE  The patient was extubated yesterday. He denies chest pain or SOB.  CURRENT MEDS . ampicillin-sulbactam (UNASYN) IV  3 g Intravenous Q6H  . antiseptic oral rinse  7 mL Mouth Rinse QID  . aspirin  81 mg Per Tube Daily  . carvedilol  6.25 mg Per Tube BID WC  . chlorhexidine gluconate  15 mL Mouth Rinse BID  . folic acid  1 mg Oral Daily  . multivitamin with minerals  1 tablet Oral Daily  . nicotine  21 mg Transdermal Daily  . pantoprazole (PROTONIX) IV  40 mg Intravenous Daily  . pneumococcal 23 valent vaccine  0.5 mL Intramuscular Tomorrow-1000  . potassium phosphate IVPB (mEq)  40 mEq Intravenous Once  . tamsulosin  0.4 mg Oral QPC supper  . thiamine  100 mg Oral Daily   Or  . thiamine  100 mg Intravenous Daily   . sodium chloride Stopped (01/13/16 2100)  . heparin 2,300 Units/hr (01/14/16 0823)   OBJECTIVE  Filed Vitals:   01/14/16 0509 01/14/16 0617 01/14/16 0700 01/14/16 0752  BP: 142/86 131/83 124/77   Pulse: 96 78 81   Temp:    97.9 F (36.6 C)  TempSrc:    Oral  Resp: 21 15 13    Height:      Weight:      SpO2: 98% 96% 90%     Intake/Output Summary (Last 24 hours) at 01/14/16 0839 Last data filed at 01/14/16 0600  Gross per 24 hour  Intake 914.93 ml  Output    945 ml  Net -30.07 ml   Filed Weights   01/10/16 2153 01/11/16 0400 01/11/16 1319  Weight: 204 lb 4.8 oz (92.67 kg) 205 lb 9.6 oz (93.26 kg) 203 lb 4.2 oz  (92.2 kg)    PHYSICAL EXAM  General: AAO x1, NAD. Neuro: Alert and oriented X 3. Moves all extremities spontaneously. Psych: Normal affect. HEENT:  Normal  Neck: Supple without bruits or JVD. Lungs:  Resp regular and unlabored, CTA. Heart: RRR no s3, s4, or murmurs. Abdomen: tender, distended, BS + x 4.  Extremities: No clubbing, cyanosis or edema. DP/PT/Radials 2+ and equal bilaterally.  Accessory Clinical Findings  CBC  Recent Labs  01/13/16 0253 01/14/16 0332  WBC 11.5* 10.7*  NEUTROABS 9.4* 8.7*  HGB 12.6* 12.1*  HCT 35.8* 34.4*  MCV 102.3* 105.8*  PLT 197 [id number]   Basic Metabolic Panel  Recent Labs  01/13/16 0253 01/13/16 1155 01/14/16 0332  NA 131* 140 141  K 3.2* 3.9 3.4*  CL 99* 105 105  CO2 21* 24 25  GLUCOSE 114* 103* 97  BUN 18 22* 22*  CREATININE 1.40* 1.43* 1.16  CALCIUM 7.6* 8.2* 8.5*  MG 2.0  --  2.1  PHOS 2.8 3.3 2.1*   Liver Function Tests  Recent Labs  01/13/16 1155 01/14/16 0332  ALBUMIN 2.6* 2.2*   No results for input(s): CKTOTAL, CKMB, CKMBINDEX, TROPONINI in  the last 72 hours. Dg Chest 2 View  01/10/2016  CLINICAL DATA:  Left lower rib pain.  Fell on Wednesday. EXAM: CHEST  2 VIEW COMPARISON:  04/02/2015 FINDINGS: The heart is enlarged but stable. There is tortuosity of the thoracic aorta. Patchy bibasilar atelectasis or infiltrates. No effusions or edema. Possible nondisplaced lower left rib fractures. No pneumothorax or obvious pleural effusion. IMPRESSION: Cardiac enlargement and bibasilar infiltrates or atelectasis. Possible lower left rib fractures.   Ct Head Wo Contrast  01/10/2016  CLINICAL DATA:  Multiple falls.  Alcohol abuse.  IMPRESSION: No acute intracranial abnormality. Stable mild cerebral atrophy and chronic small vessel disease. Chronic bilateral maxillary sinusitis. Electronically Signed   By: Earle Gell M.D.   On: 01/10/2016 18:23   Echo: 06/2014 Left ventricle: The cavity size was normal. Systolic function  was normal. Wall motion was normal; there were no regional wall motion abnormalities. There was an increased relative contribution of atrial contraction to ventricular filling.  TELE: SR to ST, HR 94 - 102  ECG: ST, otherwise normal    ASSESSMENT AND PLAN  1. NSTEMI - may be cocaine induced myocardial ischemia versus non-STEMI 2. Positive drug screen for cocaine 3. Alcoholism with acute alcoholic intoxication and withdrawal 4. Coronary artery disease as manifest by CT scan with calcification previously 5. Depression 6. Fatty liver 7. Hypertension 8. Mnire's disease with hearing loss 9. Recent fall with rib fracture  RECOMMENDATION:  The patient admitted with chest pain and NSTEMI, troponin up to 4.59, he was intubated secondary to DTs, now extubated and stable. Echo shows normal LVEF. He is minimally active but gets SOB with minimal activities.  We will schedule for a left sided cath for tomorrow.  Replace potassium.  Continue ASA, carvedilol, start atorvastatin.  Signed, Dorothy Spark MD, [facility] 01/14/2016

## 2016-01-14 NOTE — Progress Notes (Signed)
ANTICOAGULATION CONSULT NOTE - Follow Up Consult  Pharmacy Consult for heparin/ unasyn Indication: chest pain/ACS/ aspiration pneumonia  Allergies  Allergen Reactions  . Celebrex [Celecoxib] Rash  . Cymbalta [Duloxetine Hcl] Other (See Comments)    Did not work   . Lexapro [Escitalopram] Other (See Comments)    Did not work   . Trazodone And Nefazodone Other (See Comments)    Did not work   . Wellbutrin [Bupropion] Other (See Comments)    Did not work     Patient Measurements: Height: 5\' 10"  (177.8 cm) Weight: 203 lb 4.2 oz (92.2 kg) IBW/kg (Calculated) : 73  Vital Signs: Temp: 97.9 F (36.6 C) (01/26 0752) Temp Source: Oral (01/26 0752) BP: 124/77 mmHg (01/26 0700) Pulse Rate: 81 (01/26 0700)  Labs:  Recent Labs  01/11/16 1200  01/12/16 0219  01/13/16 0253 01/13/16 1155 01/13/16 1558 01/14/16 0332  HGB  --   < > 13.5  --  12.6*  --   --  12.1*  HCT  --   --  38.7*  --  35.8*  --   --  34.4*  PLT  --   --  119*  --  197  --   --  199  LABPROT 16.6*  --   --   --   --   --   --   --   INR 1.33  --   --   --   --   --   --   --   HEPARINUNFRC  --   < > 0.10*  < > 0.30  --  0.38 0.30  CREATININE  --   --  0.91  < > 1.40* 1.43*  --  1.16  < > = values in this interval not displayed.  Estimated Creatinine Clearance: 69.6 mL/min (by C-G formula based on Cr of 1.16).   Assessment: 70 year old male admitted 01/10/2016 with chest pain/ACS. Pharmacy consulted to start heparin. Currently holding off on left cardiac cath until patient more stable.  Increased rate yesterday when HL 0.30 (low end of therapeutic), after increasing rate repeat HL came back at 0.38. No rate change was made after this and HL this morning is 0.30.   H/H stable, Plt wnl. No s/sx of bleeding noted.   Patient is on day 3 of Unasyn for aspiration pneumonia. Culture from the trach aspirate is growing abundant staph aureus (sensitive to oxacillin). Patient is afebrile, WBC 10.7, SCr stable 1.17,  CrCl ~70 ml/min.   Goal of Therapy:  Heparin level 0.3-0.7 units/ml Monitor platelets by anticoagulation protocol: Yes   Plan:  - Increase heparin drip to 2300 units/hr - Check 6 hr HL - Monitor daily HL, CBC and s/sx of bleeding - Discontinue Unasyn - Start cefazolin 2g IV q8h for a total treatment duration of 10 days - Monitor C&S, CBC and clinical progression  Dimitri Ped, PharmD. PGY-1 Pharmacy Resident Pager: [phone] 01/14/2016,8:14 AM

## 2016-01-14 NOTE — Evaluation (Signed)
Clinical/Bedside Swallow Evaluation Patient Details  Name: Joshua Henson MRN: [id number] Date of Birth: Apr 24, 1946  Today's Date: 01/14/2016 Time: SLP Start Time (ACUTE ONLY): 0830 SLP Stop Time (ACUTE ONLY): 0845 SLP Time Calculation (min) (ACUTE ONLY): 15 min  Past Medical History:  Past Medical History  Diagnosis Date  . Prostate cancer ([place]) 2010    External beam radiation (urol - Risa Grill, XRT Valere Dross)  . Stroke ([place])   . Depression   . Tachycardia   . Hypertension   . Insomnia   . CAD (coronary artery disease), native coronary artery 01/10/2016    3 vessel coronary calcification noted on CT scan 04/03/15    . Fatty liver, alcoholic [id number]   Past Surgical History:  Past Surgical History  Procedure Laterality Date  . None     HPI:  Joshua Henson is a 70 y.o. male with ETOH use and polysubstance abuse (UDS positive for cocaine, opiates, benzo's). He presented to [facility] ED 01/22 with increase in quantity of mechanical falls as well as chest pain. He drinks roughly 1 pint of liquor per day In ED, he complained of back and knee pain due to his falls as well as left sided chest pain that was sharp in nature. Pain was exacerbated by deep inspiration and positional changes and was relieved by staying still or using pain meds. Girlfriend also reported confusion as well as memory loss over the previous 2 days. He has had admissions in the past related to ETOH withdrawal as well as ETOH withdrawal seizures. He was admitted for ETOH intoxication as well as NSTEMI thought to be due to cocaine induced ischemia vs NSTEMI. Intubated 1/23-1/25.    Assessment / Plan / Recommendation Clinical Impression  Pt demonstrates signs of an acute reversible dysphagia following 3 day intubation including mildly hoarse vocal quality and immediate cough with thin liquids. Cough is eliminated with use of a chin tuck or with nectar thick liquids. Given that pt may not be capable of consistency with chin  tuck, recommend nectar thick liquids and a dys 3 diet over the short term. SLP will f/u for tolerance and upgrade as needed.     Aspiration Risk  Moderate aspiration risk    Diet Recommendation Dysphagia 3 (Mech soft);Nectar-thick liquid   Liquid Administration via: Cup Medication Administration: Whole meds with puree Supervision: Patient able to self feed Compensations: Slow rate;Small sips/bites Postural Changes: Seated upright at 90 degrees    Other  Recommendations Other Recommendations: Order thickener from pharmacy   Follow up Recommendations  24 hour supervision/assistance    Frequency and Duration min 2x/week  1 week       Prognosis Prognosis for Safe Diet Advancement: Good      Swallow Study   General HPI: Joshua Henson is a 70 y.o. male with ETOH use and polysubstance abuse (UDS positive for cocaine, opiates, benzo's). He presented to [facility] ED 01/22 with increase in quantity of mechanical falls as well as chest pain. He drinks roughly 1 pint of liquor per day In ED, he complained of back and knee pain due to his falls as well as left sided chest pain that was sharp in nature. Pain was exacerbated by deep inspiration and positional changes and was relieved by staying still or using pain meds. Girlfriend also reported confusion as well as memory loss over the previous 2 days. He has had admissions in the past related to ETOH withdrawal as well as ETOH withdrawal seizures. He was admitted  for ETOH intoxication as well as NSTEMI thought to be due to cocaine induced ischemia vs NSTEMI. Intubated 1/23-1/25.  Type of Study: Bedside Swallow Evaluation Previous Swallow Assessment: none Diet Prior to this Study: NPO Temperature Spikes Noted: No Respiratory Status: Nasal cannula History of Recent Intubation: Yes Length of Intubations (days): 3 days Date extubated: 01/13/16 Behavior/Cognition: Alert;Cooperative;Pleasant mood Oral Cavity Assessment: Within Functional  Limits Oral Care Completed by SLP: No Oral Cavity - Dentition: Adequate natural dentition Vision: Functional for self-feeding Self-Feeding Abilities: Able to feed self Patient Positioning: Upright in chair Baseline Vocal Quality: Hoarse Volitional Cough: Weak Volitional Swallow: Able to elicit    Oral/Motor/Sensory Function Overall Oral Motor/Sensory Function: Within functional limits   Ice Chips     Thin Liquid Thin Liquid: Impaired Presentation: Self Fed;Cup Pharyngeal  Phase Impairments: Cough - Immediate Other Comments: eliminated with chin tuck    Nectar Thick Nectar Thick Liquid: Within functional limits   Honey Thick Honey Thick Liquid: Not tested   Puree Puree: Within functional limits   Solid   GO   Solid: Within functional limits       [facility], MA CCC-SLP [id number]  Ranald Alessio, Katherene Ponto 01/14/2016,8:59 AM

## 2016-01-14 NOTE — Clinical Social Work Note (Signed)
CSW met with patient at bedside in attempts to assess the patient's substance use and provide resources. The patient is resistant to speaking with CSW regarding this at this time, but is agreeable to CSW coming back at a later date. The patient states "I don't want to talk about this right now with all this stuff I have to do tomorrow." CSW will continue to follow.  Liz Beach MSW, [place], [place], [phone]

## 2016-01-15 ENCOUNTER — Encounter (HOSPITAL_COMMUNITY)
Admission: EM | Disposition: A | Discharge status: Home / Self Care | Payer: Self-pay | Source: Home / Self Care | Attending: Emergency Medicine

## 2016-01-15 DIAGNOSIS — F1410 Cocaine abuse, uncomplicated: Secondary | ICD-10-CM | POA: Diagnosis present

## 2016-01-15 DIAGNOSIS — I2510 Atherosclerotic heart disease of native coronary artery without angina pectoris: Secondary | ICD-10-CM | POA: Diagnosis present

## 2016-01-15 HISTORY — PX: CARDIAC CATHETERIZATION: SHX172

## 2016-01-15 LAB — CBC WITH DIFFERENTIAL/PLATELET
Basophils Absolute: 0.1 10*3/uL (ref 0.0–0.1)
Basophils Relative: 1 %
Eosinophils Absolute: 0.1 10*3/uL (ref 0.0–0.7)
Eosinophils Relative: 1 %
HEMATOCRIT: 37 % — AB (ref 39.0–52.0)
HEMOGLOBIN: 12.9 g/dL — AB (ref 13.0–17.0)
LYMPHS ABS: 1.7 10*3/uL (ref 0.7–4.0)
Lymphocytes Relative: 14 %
MCH: 35.7 pg — AB (ref 26.0–34.0)
MCHC: 34.9 g/dL (ref 30.0–36.0)
MCV: 102.5 fL — ABNORMAL HIGH (ref 78.0–100.0)
MONOS PCT: 12 %
Monocytes Absolute: 1.5 10*3/uL — ABNORMAL HIGH (ref 0.1–1.0)
NEUTROS ABS: 8.8 10*3/uL — AB (ref 1.7–7.7)
NEUTROS PCT: 72 %
Platelets: 229 10*3/uL (ref 150–400)
RBC: 3.61 MIL/uL — AB (ref 4.22–5.81)
RDW: 13.3 % (ref 11.5–15.5)
WBC: 12.1 10*3/uL — AB (ref 4.0–10.5)

## 2016-01-15 LAB — MAGNESIUM: MAGNESIUM: 1.6 mg/dL — AB (ref 1.7–2.4)

## 2016-01-15 LAB — RENAL FUNCTION PANEL
ALBUMIN: 2.2 g/dL — AB (ref 3.5–5.0)
Anion gap: 10 (ref 5–15)
BUN: 13 mg/dL (ref 6–20)
CHLORIDE: 102 mmol/L (ref 101–111)
CO2: 28 mmol/L (ref 22–32)
Calcium: 8.6 mg/dL — ABNORMAL LOW (ref 8.9–10.3)
Creatinine, Ser: 1.03 mg/dL (ref 0.61–1.24)
GFR calc Af Amer: >60 mL/min (ref >60)
GLUCOSE: 108 mg/dL — AB (ref 65–99)
POTASSIUM: 3.6 mmol/L (ref 3.5–5.1)
Phosphorus: 1.8 mg/dL — ABNORMAL LOW (ref 2.5–4.6)
SODIUM: 140 mmol/L (ref 135–145)

## 2016-01-15 LAB — HEPARIN LEVEL (UNFRACTIONATED): Heparin Unfractionated: 0.39 IU/mL (ref 0.30–0.70)

## 2016-01-15 SURGERY — LEFT HEART CATH AND CORONARY ANGIOGRAPHY
Anesthesia: LOCAL

## 2016-01-15 MED ORDER — FENTANYL CITRATE (PF) 100 MCG/2ML IJ SOLN
INTRAMUSCULAR | Status: AC
  Filled 2016-01-15: qty 2

## 2016-01-15 MED ORDER — VERAPAMIL HCL 2.5 MG/ML IV SOLN
INTRAVENOUS | Status: AC
  Filled 2016-01-15: qty 2

## 2016-01-15 MED ORDER — SODIUM CHLORIDE 0.9% FLUSH
3.0000 mL | Freq: Two times a day (BID) | INTRAVENOUS | Status: DC
  Administered 2016-01-15: 3 mL via INTRAVENOUS

## 2016-01-15 MED ORDER — FENTANYL CITRATE (PF) 100 MCG/2ML IJ SOLN
INTRAMUSCULAR | Status: DC | PRN
  Administered 2016-01-15: 25 ug via INTRAVENOUS

## 2016-01-15 MED ORDER — MIDAZOLAM HCL 2 MG/2ML IJ SOLN
INTRAMUSCULAR | Status: AC
  Filled 2016-01-15: qty 2

## 2016-01-15 MED ORDER — MAGNESIUM SULFATE 2 GM/50ML IV SOLN
2.0000 g | Freq: Once | INTRAVENOUS | Status: AC
  Administered 2016-01-15: 2 g via INTRAVENOUS
  Filled 2016-01-15: qty 50

## 2016-01-15 MED ORDER — SODIUM CHLORIDE 0.9% FLUSH: 3.0000 mL | INTRAVENOUS | Status: DC | PRN

## 2016-01-15 MED ORDER — MIDAZOLAM HCL 2 MG/2ML IJ SOLN
INTRAMUSCULAR | Status: DC | PRN
  Administered 2016-01-15: 1 mg via INTRAVENOUS

## 2016-01-15 MED ORDER — HEPARIN (PORCINE) IN NACL 2-0.9 UNIT/ML-% IJ SOLN
INTRAMUSCULAR | Status: AC
  Filled 2016-01-15: qty 1000

## 2016-01-15 MED ORDER — VERAPAMIL HCL 2.5 MG/ML IV SOLN
INTRAVENOUS | Status: DC | PRN
  Administered 2016-01-15: 15:00:00 via INTRA_ARTERIAL

## 2016-01-15 MED ORDER — SODIUM CHLORIDE 0.9 % WEIGHT BASED INFUSION: 3.0000 mL/kg/h | INTRAVENOUS | Status: DC

## 2016-01-15 MED ORDER — LIDOCAINE HCL (PF) 1 % IJ SOLN
INTRAMUSCULAR | Status: AC
  Filled 2016-01-15: qty 30

## 2016-01-15 MED ORDER — HEPARIN SODIUM (PORCINE) 1000 UNIT/ML IJ SOLN
INTRAMUSCULAR | Status: DC | PRN
  Administered 2016-01-15: 4500 [IU] via INTRAVENOUS

## 2016-01-15 MED ORDER — SODIUM CHLORIDE 0.9 % IV SOLN
250.0000 mL | INTRAVENOUS | Status: DC | PRN
  Administered 2016-01-15: 250 mL via INTRAVENOUS

## 2016-01-15 MED ORDER — SODIUM CHLORIDE 0.9 % IV SOLN: 250.0000 mL | INTRAVENOUS | Status: DC | PRN

## 2016-01-15 MED ORDER — SODIUM CHLORIDE 0.9 % IV SOLN
INTRAVENOUS | Status: AC
  Administered 2016-01-15: 16:00:00 via INTRAVENOUS

## 2016-01-15 MED ORDER — IOHEXOL 350 MG/ML SOLN
INTRAVENOUS | Status: DC | PRN
  Administered 2016-01-15: 75 mL via INTRA_ARTERIAL

## 2016-01-15 MED ORDER — HALOPERIDOL LACTATE 5 MG/ML IJ SOLN
5.0000 mg | Freq: Four times a day (QID) | INTRAMUSCULAR | Status: DC | PRN
Start: 2016-01-15 — End: 2016-01-18
  Administered 2016-01-16 – 2016-01-17 (×2): 5 mg via INTRAVENOUS
  Filled 2016-01-15 (×2): qty 1

## 2016-01-15 MED ORDER — SODIUM CHLORIDE 0.9% FLUSH
3.0000 mL | Freq: Two times a day (BID) | INTRAVENOUS | Status: DC
  Administered 2016-01-15 – 2016-01-17 (×5): 3 mL via INTRAVENOUS

## 2016-01-15 MED ORDER — SODIUM CHLORIDE 0.9 % WEIGHT BASED INFUSION: 1.0000 mL/kg/h | INTRAVENOUS | Status: DC

## 2016-01-15 MED ORDER — HEPARIN SODIUM (PORCINE) 1000 UNIT/ML IJ SOLN
INTRAMUSCULAR | Status: AC
  Filled 2016-01-15: qty 1

## 2016-01-15 MED ORDER — LIDOCAINE HCL (PF) 1 % IJ SOLN
INTRAMUSCULAR | Status: DC | PRN
  Administered 2016-01-15: 15:00:00

## 2016-01-15 MED ORDER — ASPIRIN 81 MG PO CHEW
81.0000 mg | CHEWABLE_TABLET | ORAL | Status: AC
  Administered 2016-01-15: 81 mg via ORAL

## 2016-01-15 MED ORDER — POTASSIUM PHOSPHATES 15 MMOLE/5ML IV SOLN
40.0000 meq | Freq: Once | INTRAVENOUS | Status: AC
  Administered 2016-01-15: 40 meq via INTRAVENOUS
  Filled 2016-01-15: qty 9.09

## 2016-01-15 MED ORDER — HALOPERIDOL LACTATE 5 MG/ML IJ SOLN
INTRAMUSCULAR | Status: AC
  Administered 2016-01-16: 5 mg via INTRAVENOUS
  Filled 2016-01-15: qty 1

## 2016-01-15 MED ORDER — LORAZEPAM 2 MG/ML IJ SOLN
2.0000 mg | INTRAMUSCULAR | Status: DC | PRN
Start: 2016-01-15 — End: 2016-01-18
  Administered 2016-01-15 – 2016-01-16 (×5): 2 mg via INTRAVENOUS
  Administered 2016-01-16: 3 mg via INTRAVENOUS
  Administered 2016-01-16 – 2016-01-17 (×4): 2 mg via INTRAVENOUS
  Administered 2016-01-17 – 2016-01-18 (×2): 3 mg via INTRAVENOUS
  Administered 2016-01-18: 2 mg via INTRAVENOUS
  Filled 2016-01-15 (×5): qty 1
  Filled 2016-01-15: qty 2
  Filled 2016-01-15: qty 1
  Filled 2016-01-15: qty 2
  Filled 2016-01-15 (×2): qty 1
  Filled 2016-01-15 (×2): qty 2
  Filled 2016-01-15: qty 1

## 2016-01-15 SURGICAL SUPPLY — 10 items
CATH INFINITI 5 FR JL3.5 (CATHETERS) ×2 IMPLANT
CATH INFINITI JR4 5F (CATHETERS) ×2 IMPLANT
DEVICE RAD COMP TR BAND LRG (VASCULAR PRODUCTS) ×2 IMPLANT
GLIDESHEATH SLEND SS 6F .021 (SHEATH) ×2 IMPLANT
KIT HEART LEFT (KITS) ×2 IMPLANT
PACK CARDIAC CATHETERIZATION (CUSTOM PROCEDURE TRAY) ×2 IMPLANT
TRANSDUCER W/STOPCOCK (MISCELLANEOUS) ×2 IMPLANT
TUBING CIL FLEX 10 FLL-RA (TUBING) ×2 IMPLANT
WIRE HI TORQ VERSACORE-J 145CM (WIRE) ×2 IMPLANT
WIRE SAFE-T 1.5MM-J .035X260CM (WIRE) ×2 IMPLANT

## 2016-01-15 NOTE — Progress Notes (Signed)
Orthopedic Tech Progress Note Patient Details:  Joshua Henson February 01, 1946 [id number]  Ortho Devices Type of Ortho Device: Arm sling Ortho Device/Splint Location: RUE Ortho Device/Splint Interventions: Ordered, Application   Hyun, [place] 01/15/2016, 4:08 PM

## 2016-01-15 NOTE — Progress Notes (Signed)
ANTICOAGULATION CONSULT NOTE - Follow Up Consult  Pharmacy Consult for heparin Indication: chest pain/ACS  Allergies  Allergen Reactions  . Celebrex [Celecoxib] Rash    Patient Measurements: Height: 5\' 10"  (177.8 cm) Weight: 206 lb 9.1 oz (93.7 kg) IBW/kg (Calculated) : 73  Vital Signs: Temp: 99.3 F (37.4 C) (01/27 0759) Temp Source: Oral (01/27 0759) BP: 134/80 mmHg (01/27 0700) Pulse Rate: 66 (01/27 0700)  Labs:  Recent Labs  01/13/16 0253 01/13/16 1155  01/14/16 0332 01/14/16 1501 01/15/16 0236  HGB 12.6*  --   --  12.1*  --  12.9*  HCT 35.8*  --   --  34.4*  --  37.0*  PLT 197  --   --  199  --  229  HEPARINUNFRC 0.30  --   < > 0.30 0.26* 0.39  CREATININE 1.40* 1.43*  --  1.16  --  1.03  < > = values in this interval not displayed.  Estimated Creatinine Clearance: 78.9 mL/min (by C-G formula based on Cr of 1.03).  Assessment: 70 year old male admitted 01/10/2016 with chest pain/ACS. Pharmacy to dose heparin.   HL 0.39, H/H stable. Plt wnl. No s/sx of bleeding noted.  Goal of Therapy:  Heparin level 0.3-0.7 units/ml Monitor platelets by anticoagulation protocol: Yes   Plan:  - Continue heparin drip 2450 units/hr - Monitor daily HL, CBC and s/sx of bleeding - Plan for left heart cath today  Dimitri Ped, PharmD. PGY-1 Pharmacy Resident Pager: [phone]  01/15/2016,8:09 AM

## 2016-01-15 NOTE — Evaluation (Signed)
Physical Therapy Evaluation Patient Details Name: Joshua Henson MRN: [id number] DOB: 1946/07/15 Today's Date: 01/15/2016   History of Present Illness  This is 70 year old male with a past medical history significant for alcohol abuse, hypertension, stroke, seizures, prostate cancer s/p radiation, hearing loss 2/2 Mnire's disease; who presents with a history of increasing falls and chest pain. Troponin 4.89 in ED.   Clinical Impression  Pt admitted with/for ETOH intoxication and upon arrival noted elevated troponins.  Now for CATH.Marland Kitchen  Pt currently limited functionally due to the problems listed below.  (see problems list.)  Pt will benefit from PT to maximize function and safety to be able to get home safely with available assist of family.     Follow Up Recommendations Home health PT;Supervision/Assistance - 24 hour;Other (comment) (May improve and need none of the above.  await Cath results)    Equipment Recommendations  None recommended by PT    Recommendations for Other Services       Precautions / Restrictions Precautions Precautions: Fall Restrictions Weight Bearing Restrictions: No      Mobility  Bed Mobility               General bed mobility comments: pt already in the chair  Transfers Overall transfer level: Needs assistance   Transfers: Sit to/from Stand Sit to Stand: Min guard         General transfer comment: pt declined doing anything more than standing and this with much encouragement  Ambulation/Gait             General Gait Details: pt declined politely  Stairs            Wheelchair Mobility    Modified Rankin (Stroke Patients Only)       Balance Overall balance assessment: Needs assistance Sitting-balance support: No upper extremity supported Sitting balance-Leahy Scale: Fair                                       Pertinent Vitals/Pain Pain Assessment: Faces Faces Pain Scale: Hurts little more Pain  Location: back pain Pain Descriptors / Indicators: Discomfort;Grimacing Pain Intervention(s): Monitored during session    Home Living Family/patient expects to be discharged to:: Private residence Living Arrangements: Spouse/significant other Available Help at Discharge: Family;Available PRN/intermittently;Available 24 hours/day (Hopefully significant other can give up to 24/7 intially) Type of Home: Other(Comment) (condo) Home Access: Stairs to enter Entrance Stairs-Rails: Right;Left Entrance Stairs-Number of Steps: 3 Home Layout: Two level;Able to live on main level with bedroom/bathroom Home Equipment: Gilford Rile - 2 wheels;Shower seat - built in;Bedside commode      Prior Function Level of Independence: Independent               Hand Dominance   Dominant Hand: Right    Extremity/Trunk Assessment   Upper Extremity Assessment: Defer to OT evaluation           Lower Extremity Assessment: Generalized weakness;Overall [facility] for tasks assessed (hip flexor and hamstring weakness)         Communication   Communication: HOH  Cognition Arousal/Alertness: Awake/alert Behavior During Therapy: WFL for tasks assessed/performed Overall Cognitive Status: Within Functional Limits for tasks assessed                      General Comments General comments (skin integrity, edema, etc.): pt relates a medical hx that is all out of chronilogical  order and filled with few details.  pt like still in mild withdrawal  with some shaking and distractablity.    Exercises        Assessment/Plan    PT Assessment Patient needs continued PT services  PT Diagnosis Generalized weakness   PT Problem List Decreased strength;Decreased coordination;Decreased mobility;Pain  PT Treatment Interventions Gait training;Stair training;Functional mobility training;Therapeutic activities;Therapeutic exercise;Balance training;Patient/family education   PT Goals (Current goals can be found in the  Care Plan section) Acute Rehab PT Goals Patient Stated Goal: none PT Goal Formulation: With patient Time For Goal Achievement: 01/29/16 Potential to Achieve Goals: Good    Frequency Min 3X/week   Barriers to discharge        Co-evaluation               End of Session   Activity Tolerance: Other (comment);Patient tolerated treatment well (pt self- limiting today) Patient left: in chair;with call bell/phone within reach;with chair alarm set Nurse Communication: Mobility status         Time: 1048-1101 PT Time Calculation (min) (ACUTE ONLY): 13 min   Charges:   PT Evaluation $PT Eval Moderate Complexity: 1 Procedure     PT G Codes:        Majesty Oehlert, Tessie Fass 01/15/2016, 11:18 AM   01/15/2016  Donnella Sham, PT [phone]  (pager)

## 2016-01-15 NOTE — Progress Notes (Signed)
[facility] TEAM 1 - Stepdown/ICU TEAM Progress Note  Joshua Henson [id number] DOB: [birth year] DOA: 01/10/2016 PCP: Joshua Hare, MD  Admit HPI / Brief Narrative: 70 year old WM PMHx Polysubstance Abuse (Alcohol Abuse Cocaine), HTN, CVA, Prostate Cancer S/P XRT,Hearing loss 2/2 Mnire's disease, CKD;   who presents with a history of increasing falls and chest pain. Patient is somewhat of a poor historian and his live-in girlfriend provides much of his history. Patient in the recent weeks has been drinking more. A couple days ago when she has come home from work she notes that he fell on the [place] and table and broke and it. She notes that he has a previous history of seizures. Patient had reported left-sided chest pain and he had bruises of his knees. She notes last few weeks that she's noticed a rapid loss of his muscle mass in his lower extremities. Patient notes that he has left-sided chest pain more so than the right. Pain is sharp in nature. Worse with deep inspiration or trying to sit up or changes positions. Relieved by staying still and pain medication. His girlfriend also noticed that he's been more confused with memory loss in the last 2 days. He actually called his daughter today and was talking so confused that she called the patient's girlfriend to see if everything was all right. Review of patient's record shows that he's had multiple previous admissions for alcoholism suffering previous alcohol withdrawal seizures.  Upon arrival to the emergency department patient is evaluated with a CT scan of the head which shows no acute abnormalities. Patient was found to have relatively normal blood work except for Alcohol level 210, elevated troponin of 4.59, but normal EKG. Cardiology was consulted and evaluated the patient and recommended inpatient admission trending cardiac enzymes and check CK level.   HPI/Subjective: 1/27 A/O 4 extremity agitated, unable to stay focused on one  subject (jumping from why was he admitted and who is the admitting doctor 4 days ago to why he had to have cardiac catheterization and is now required to lay still), patient's significant other Joshua Henson [id number] arrived and was able to help calm patient, and convince patient to remain in the hospital.   Assessment/Plan: NSTEMI - Possible cocaine induced ischemia with questionable EtOH Induced Cardiomyopathy -Continue Aspirin 81 mg -Continue Carvedilol to 6.25 mg BID, hold for HR <60, SBP <110 -Hydralazine prn for SBP > 170, DBP > 110 -1/20 7S/P left heart catheterization mild to moderate disease multiple vessels. Secondary to patient's substance abuse per cardiology placing stent was not appropriate. Recommend medical management  Acute Hypoxic Respiratory Failure Requiring Intubation - Likely secondary to aspiration. -Extubated 1/25 - Breathing well on room air -Incentive Spirometry, Flutter valve -SLP evaluated post-extubation -> recommend Dysphagia 3 diet nectar thick liquid  Aspiration Pneumonia  -Tracheal aspirate positive  Methicillin Sensitive Staph aureus -Completed Unasyn  4 days and then was switched to Cefazolin for total treatment duration of 10 days -NOTE; Hx of C. diff  Acute Encephalopathy - -Most likely secondary to Polysubstance abuse - UDS positive for cocaine, opiates, benzo's. -Patient is A/O 4 however extremely combative, agitated flights of thought, threatening to tear off Hemostasis band on his arm over the catheterization site even though he was warned this could result in terminal bleeding. -Request safety sitter -Ms patient's significant other Joshua Henson [id number] arrived and was able to help calm patient, and convince patient to remain in the hospital. -Patient placed on CIWA protocol -Haldol 5 mg  QID PRN agitation -Continue thiamine / folate / multivitamin. -Could consider Substance and ETOH abuse counseling after patient completes  withdraw -Hold outpatient bupropion, chlordiazepoxide, norco.  Depression -Continue citalopram 20 mg QHS  Tobacco use -Nicotine Patch 21mg   AKI - Resolved   Hypokalemia -Potassium goal>4 -K-Phos 40 mEq 1  Hypomagnesemia  -magnesium goal > 2  -Magnesium 2 gm  Hypophosphatemia -See hypokalemia    Code Status: FULL Family Communication: significant other Joshua Henson [phone] present at time of exam Disposition Plan: SNF?    Consultants: Joshua Henson [facility]    Procedure/Significant Events: 01/22 > admitted with ETOH withdrawal. 1/22 CT Head w/o : No acute intracranial process. 01/23 > PCCM consulted for transfer to ICU for initiation of precedex. Required intubation for respiratory insufficiency. 1/23 PCXR: ETT 5cm above carina. Patchy airspace opacity bilateral bases. No effusion. 1/24 TTE : LV normal in size with mild LVH. EF 55%. Inadequate to assess wall motion. Grade 1 diastolic dysfunction. RV normal in size and function. 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 -Mild non-obstructive disease in the LAD and Circumflex  Culture 1/23 Tracheal Asp positive: MSSA 1/23 MRSA nares Negative 1/24 Urine Strep Antigen  Negative 1/24 Urine Legionella Antigen Negative   Antibiotics: Unasyn 1/23 >> 1/26 Cefazolin 1/26 >>  DVT prophylaxis: Heparin drip, SCD   Devices Right arm catheter band  LINES / TUBES:  OETT 8.0 1/23>> 1/25 Foley 1/23>> 1/25 PIV x2    Continuous Infusions: . sodium chloride Stopped (01/13/16 2100)  . sodium chloride 75 mL/hr at 01/15/16 1545    Objective: VITAL SIGNS: Temp: 98.6 F (37 C) (01/27 1146) Temp Source: Oral (01/27 1146) BP: 152/85 mmHg (01/27 1813) Pulse Rate: 77 (01/27 1813) SPO2; FIO2:   Intake/Output Summary (Last 24 hours) at 01/15/16 1913 Last data filed at 01/15/16 1311  Gross per 24 hour  Intake  649.21 ml  Output   1100 ml  Net -450.79 ml     Exam: General: A/O 4 extremity agitated, unable to stay focused on one subject (jumping from why was he admitted and who is the admitting doctor 4 days ago to why he had to have cardiac catheterization and is now required to lay still),, No acute respiratory distress Eyes: Negative headache,  scleral hemorrhage ENT: Negative Runny nose, negative gingival bleeding, Neck:  Negative scars, masses, torticollis, lymphadenopathy, JVD Lungs: Clear to auscultation bilaterally without wheezes or crackles Cardiovascular: Regular rate and rhythm without murmur gallop or rub normal S1 and S2 Abdomen:negative abdominal pain, nondistended, positive soft, bowel sounds, no rebound, no ascites, no appreciable mass Extremities: No significant cyanosis, clubbing, or edema bilateral lower extremities Psychiatric:  Negative depression, negative anxiety, negative fatigue, negative mania, positive with draws (agitated, violent, illogical)  Neurologic:  Cranial nerves II through XII intact, tongue/uvula midline, all extremities muscle strength 5/5, sensation intact throughout, negative dysarthria, negative expressive aphasia, negative receptive aphasia.   Data Reviewed: Basic Metabolic Panel:  Recent Labs Lab 01/12/16 0219 01/12/16 1405 01/13/16 0253 01/13/16 1155 01/14/16 0332 01/15/16 0236  NA 137 137 131* 140 141 140  K 3.0* 3.2* 3.2* 3.9 3.4* 3.6  CL 102 102 99* 105 105 102  CO2 22 24 21* 24 25 28   GLUCOSE 152* 142* 114* 103* 97 108*  BUN 9 10 18  22* 22* 13  CREATININE 0.91 0.79 1.40* 1.43* 1.16 1.03  CALCIUM 8.5* 8.3* 7.6* 8.2* 8.5* 8.6*  MG 1.6* 2.0 2.0  --  2.1 1.6*  PHOS 1.7* 2.6 2.8 3.3 2.1* 1.8*   Liver Function Tests:  Recent Labs Lab 01/10/16 1712 01/12/16 1405 01/13/16 0253 01/13/16 1155 01/14/16 0332 01/15/16 0236  AST 24  --   --   --   --   --   ALT 22  --   --   --   --   --   ALKPHOS 57  --   --   --   --   --   BILITOT  0.7  --   --   --   --   --   PROT 6.1*  --   --   --   --   --   ALBUMIN 3.3* 2.7* 2.5* 2.6* 2.2* 2.2*   No results for input(s): LIPASE, AMYLASE in the last 168 hours. No results for input(s): AMMONIA in the last 168 hours. CBC:  Recent Labs Lab 01/10/16 1712 01/12/16 0219 01/13/16 0253 01/14/16 0332 01/15/16 0236  WBC 7.5 8.1 11.5* 10.7* 12.1*  NEUTROABS  --   --  9.4* 8.7* 8.8*  HGB 13.9 13.5 12.6* 12.1* 12.9*  HCT 39.9 38.7* 35.8* 34.4* 37.0*  MCV 103.9* 102.1* 102.3* 105.8* 102.5*  PLT 193 119* 197 199 229   Cardiac Enzymes:  Recent Labs Lab 01/10/16 1712 01/10/16 2322 01/11/16 0236 01/11/16 0528  CKTOTAL 47*  --   --  82  CKMB 1.3  --   --   --   TROPONINI 4.59* 4.53* 4.35* 4.46*   BNP (last 3 results) No results for input(s): BNP in the last 8760 hours.  ProBNP (last 3 results) No results for input(s): PROBNP in the last 8760 hours.  CBG:  Recent Labs Lab 01/11/16 1329  GLUCAP 137*    Recent Results (from the past 240 hour(s))  MRSA PCR Screening     Status: None   Collection Time: 01/11/16  1:25 PM  Result Value Ref Range Status   MRSA by PCR NEGATIVE NEGATIVE Final    Comment:        The GeneXpert MRSA Assay (FDA approved for NASAL specimens only), is one component of a comprehensive MRSA colonization surveillance program. It is not intended to diagnose MRSA infection nor to guide or monitor treatment for MRSA infections.   Culture, respiratory (NON-Expectorated)     Status: None   Collection Time: 01/11/16 10:50 PM  Result Value Ref Range Status   Specimen Description TRACHEAL ASPIRATE  Final   Special Requests NONE  Final   Gram Stain   Final    ABUNDANT WBC PRESENT, PREDOMINANTLY PMN NO SQUAMOUS EPITHELIAL CELLS SEEN ABUNDANT GRAM POSITIVE COCCI IN CLUSTERS Performed at [facility]    Culture   Final    ABUNDANT STAPHYLOCOCCUS AUREUS Note: RIFAMPIN AND GENTAMICIN SHOULD NOT BE USED AS SINGLE DRUGS FOR TREATMENT OF  STAPH INFECTIONS. This organism is presumed to be Clindamycin resistant based on detection of inducible Clindamycin resistance. Performed at [facility]    Report Status 01/14/2016 FINAL  Final   Organism ID, Bacteria STAPHYLOCOCCUS AUREUS  Final      Susceptibility   Staphylococcus aureus - MIC*    CLINDAMYCIN RESISTANT      ERYTHROMYCIN >=8 RESISTANT Resistant     GENTAMICIN <=0.5 SENSITIVE Sensitive     LEVOFLOXACIN 0.25 SENSITIVE Sensitive     OXACILLIN 0.5 SENSITIVE Sensitive     RIFAMPIN <=0.5 SENSITIVE Sensitive     TRIMETH/SULFA <=10 SENSITIVE Sensitive     VANCOMYCIN 1  SENSITIVE Sensitive     TETRACYCLINE <=1 SENSITIVE Sensitive     MOXIFLOXACIN <=0.25 SENSITIVE Sensitive     * ABUNDANT STAPHYLOCOCCUS AUREUS     Studies:  Recent x-ray studies have been reviewed in detail by the Attending Physician  Scheduled Meds:  Scheduled Meds: . aspirin  81 mg Per Tube Daily  . atorvastatin  40 mg Oral q1800  . carvedilol  6.25 mg Per Tube BID WC  .  ceFAZolin (ANCEF) IV  2 g Intravenous 3 times per day  . citalopram  20 mg Oral QHS  . folic acid  1 mg Oral Daily  . haloperidol lactate      . multivitamin with minerals  1 tablet Oral Daily  . nicotine  21 mg Transdermal Daily  . pneumococcal 23 valent vaccine  0.5 mL Intramuscular Tomorrow-1000  . potassium phosphate IVPB (mEq)  40 mEq Intravenous Once  . sodium chloride flush  3 mL Intravenous Q12H  . tamsulosin  0.4 mg Oral QPC supper  . thiamine  100 mg Oral Daily   Or  . thiamine  100 mg Intravenous Daily    Time spent on care of this patient: 40 mins   Naveen Lorusso, Geraldo Docker , MD  [facility] Office  [phone] Pager - [phone]  On-Call/Text Page:      [email or web address]      password [email or web address]  If 7PM-7AM, please contact night-coverage [email or web address] Password [email or web address] 01/15/2016, 7:13 PM   LOS: 4 days   Care during the described time interval was provided by me .  I have reviewed this patient's available  data, including medical history, events of note, physical examination, and all test results as part of my evaluation. I have personally reviewed and interpreted all radiology studies.   Dia Crawford, MD [phone] Pager

## 2016-01-15 NOTE — H&P (View-Only) (Deleted)
Patient Name: Joshua Henson Date of Encounter: 01/15/2016  Active Problems:   Elevated troponin   Chest pain   CAD (coronary artery disease), native coronary artery   Alcohol abuse with intoxication ([place])   Frequent falls   Left rib fracture   Acute encephalopathy   Polysubstance abuse   Delirium tremens ([place])   NSTEMI (non-ST elevated myocardial infarction) ([place])   Sepsis due to Enterococcus with acute renal failure and metabolic encephalopathy ([place])   Acute respiratory failure with hypoxia ([place])   Length of Stay: 4  SUBJECTIVE  The patient was extubated on 1/25, transferred to telemetry, he denies chest pain or SOB.  CURRENT MEDS . aspirin  81 mg Per Tube Daily  . aspirin  81 mg Oral Pre-Cath  . atorvastatin  40 mg Oral q1800  . carvedilol  6.25 mg Per Tube BID WC  .  ceFAZolin (ANCEF) IV  2 g Intravenous 3 times per day  . citalopram  20 mg Oral QHS  . folic acid  1 mg Oral Daily  . magnesium sulfate 1 - 4 g bolus IVPB  2 g Intravenous Once  . multivitamin with minerals  1 tablet Oral Daily  . nicotine  21 mg Transdermal Daily  . pneumococcal 23 valent vaccine  0.5 mL Intramuscular Tomorrow-1000  . sodium chloride flush  3 mL Intravenous Q12H  . tamsulosin  0.4 mg Oral QPC supper  . thiamine  100 mg Oral Daily   Or  . thiamine  100 mg Intravenous Daily   . sodium chloride Stopped (01/13/16 2100)  . [START ON 01/16/2016] sodium chloride     Followed by  . [START ON 01/16/2016] sodium chloride 1 mL/kg/hr (01/15/16 0759)  . heparin 2,450 Units/hr (01/15/16 0500)   OBJECTIVE  Filed Vitals:   01/15/16 0700 01/15/16 0759 01/15/16 0800 01/15/16 0916  BP: 134/80  132/76 110/72  Pulse: 66  66 71  Temp:  99.3 F (37.4 C)  98.5 F (36.9 C)  TempSrc:  Oral  Oral  Resp: 21  22 18   Height:    5\' 10"  (1.778 m)  Weight:    204 lb 9.6 oz (92.806 kg)  SpO2: 90%  91% 92%    Intake/Output Summary (Last 24 hours) at 01/15/16 0950 Last data filed at 01/15/16 0800  Gross per 24 hour  Intake 1283.19 ml  Output   1225 ml  Net  58.19 ml   Filed Weights   01/11/16 1319 01/15/16 0400 01/15/16 0916  Weight: 203 lb 4.2 oz (92.2 kg) 206 lb 9.1 oz (93.7 kg) 204 lb 9.6 oz (92.806 kg)    PHYSICAL EXAM  General: AAO x1, NAD. Neuro: Alert and oriented X 3. Moves all extremities spontaneously. Psych: Normal affect. HEENT:  Normal  Neck: Supple without bruits or JVD. Lungs:  Resp regular and unlabored, CTA. Heart: RRR no s3, s4, or murmurs. Abdomen: tender, distended, BS + x 4.  Extremities: No clubbing, cyanosis or edema. DP/PT/Radials 2+ and equal bilaterally.  Accessory Clinical Findings  CBC  Recent Labs  01/14/16 0332 01/15/16 0236  WBC 10.7* 12.1*  NEUTROABS 8.7* 8.8*  HGB 12.1* 12.9*  HCT 34.4* 37.0*  MCV 105.8* 102.5*  PLT 199 [id number]   Basic Metabolic Panel  Recent Labs  01/14/16 0332 01/15/16 0236  NA 141 140  K 3.4* 3.6  CL 105 102  CO2 25 28  GLUCOSE 97 108*  BUN 22* 13  CREATININE 1.16 1.03  CALCIUM 8.5* 8.6*  MG 2.1 1.6*  PHOS 2.1* 1.8*   Liver Function Tests  Recent Labs  01/14/16 0332 01/15/16 0236  ALBUMIN 2.2* 2.2*   No results for input(s): CKTOTAL, CKMB, CKMBINDEX, TROPONINI in the last 72 hours. Dg Chest 2 View  01/10/2016  CLINICAL DATA:  Left lower rib pain.  Fell on Wednesday. EXAM: CHEST  2 VIEW COMPARISON:  04/02/2015 FINDINGS: The heart is enlarged but stable. There is tortuosity of the thoracic aorta. Patchy bibasilar atelectasis or infiltrates. No effusions or edema. Possible nondisplaced lower left rib fractures. No pneumothorax or obvious pleural effusion. IMPRESSION: Cardiac enlargement and bibasilar infiltrates or atelectasis. Possible lower left rib fractures.   Ct Head Wo Contrast  01/10/2016  CLINICAL DATA:  Multiple falls.  Alcohol abuse.  IMPRESSION: No acute intracranial abnormality. Stable mild cerebral atrophy and chronic small vessel disease. Chronic bilateral maxillary sinusitis.  Electronically Signed   By: Earle Gell M.D.   On: 01/10/2016 18:23   Echo: 06/2014 Left ventricle: The cavity size was normal. Systolic function was normal. Wall motion was normal; there were no regional wall motion abnormalities. There was an increased relative contribution of atrial contraction to ventricular filling.  TELE: SR to ST, HR 94 - 102  ECG: ST, otherwise normal    ASSESSMENT AND PLAN  1. NSTEMI - may be cocaine induced myocardial ischemia versus non-STEMI 2. Positive drug screen for cocaine 3. Alcoholism with acute alcoholic intoxication and withdrawal 4. Coronary artery disease as manifest by CT scan with calcification previously 5. Depression 6. Fatty liver 7. Hypertension 8. Mnire's disease with hearing loss 9. Recent fall with rib fracture  RECOMMENDATION:  The patient admitted with chest pain and NSTEMI, troponin up to 4.59, he was intubated secondary to DTs, now extubated and stable. Echo shows normal LVEF. He is minimally active but gets SOB with minimal activities. A left sided cath scheduled for today.  Potassium now corrected.  Continue ASA, carvedilol, start atorvastatin.  Signed, Dorothy Spark MD, [facility] 01/15/2016

## 2016-01-15 NOTE — Progress Notes (Signed)
1845 called to room from sitter, pt agitated and sitting at bedside about to leave room. Pt attempting to stand up, very unsteady on feet. Paged Dr. Sherral Hammers who ordered restraints. Paged security to be present when restraints are placed due to pt verbal accusations. Will continue to monitor.

## 2016-01-15 NOTE — Progress Notes (Signed)
SLP Cancellation Note  Patient Details Name: Joshua Henson MRN: [id number] DOB: 1946-12-19   Cancelled treatment:       Reason Eval/Treat Not Completed: Patient at procedure or test/unavailable. Pt NPO for procedure.    Briselda Naval, Katherene Ponto 01/15/2016, 8:40 AM

## 2016-01-15 NOTE — Progress Notes (Signed)
1520 pt arrived with cath lab RN. Pt agitated and threatening to leave, attempting to climb out of bed regardless of our attempts to calm him down and to remain in bed for his safety. Instructed pt of risk for bleeding. Paged Dr. Sherral Hammers regarding pt behavior. [place] Dr. Sherral Hammers at bedside. Pt agitated and remains adamant in attempting to leave regardless of potential for bleeding. Arm sling ordered in attempt to keep arm still. Sitter ordered. CIWA protocol now in place.  1530 Unable to retrieve vital signs and remove air from TR band per protocol due to patient agitation. R radial pulses 2+ and warm to touch. Will continue to monitor. 1630 2 mg ativan IV given due to CIWA score. Will continue to monitor pt.

## 2016-01-15 NOTE — Interval H&P Note (Deleted)
History and Physical Interval Note:  01/15/2016 2:22 PM  Joshua Henson  has presented today for cardiac cath with the diagnosis of NSTEMI, CAD by CT scan. The various methods of treatment have been discussed with the patient and family. After consideration of risks, benefits and other options for treatment, the patient has consented to  Procedure(s): Left Heart Cath and Coronary Angiography (N/A) as a surgical intervention .  The patient's history has been reviewed, patient examined, no change in status, stable for surgery.  I have reviewed the patient's chart and labs.  Questions were answered to the patient's satisfaction.    Cath Lab Visit (complete for each Cath Lab visit)  Clinical Evaluation Leading to the Procedure:   ACS: Yes.    Non-ACS:    Anginal Classification: CCS III  Anti-ischemic medical therapy: Minimal Therapy (1 class of medications)  Non-Invasive Test Results: No non-invasive testing performed  Prior CABG: No previous CABG         Henson,Joshua

## 2016-01-15 NOTE — Progress Notes (Signed)
Patient Name: Joshua Henson Date of Encounter: 01/15/2016  Active Problems:   Elevated troponin   Chest pain   CAD (coronary artery disease), native coronary artery   Alcohol abuse with intoxication ([place])   Frequent falls   Left rib fracture   Acute encephalopathy   Polysubstance abuse   Delirium tremens ([place])   NSTEMI (non-ST elevated myocardial infarction) ([place])   Sepsis due to Enterococcus with acute renal failure and metabolic encephalopathy ([place])   Acute respiratory failure with hypoxia ([place])   Length of Stay: 4  SUBJECTIVE  The patient was extubated on 1/25, transferred to telemetry, he denies chest pain or SOB.  CURRENT MEDS . aspirin  81 mg Per Tube Daily  . aspirin  81 mg Oral Pre-Cath  . atorvastatin  40 mg Oral q1800  . carvedilol  6.25 mg Per Tube BID WC  .  ceFAZolin (ANCEF) IV  2 g Intravenous 3 times per day  . citalopram  20 mg Oral QHS  . folic acid  1 mg Oral Daily  . magnesium sulfate 1 - 4 g bolus IVPB  2 g Intravenous Once  . multivitamin with minerals  1 tablet Oral Daily  . nicotine  21 mg Transdermal Daily  . pneumococcal 23 valent vaccine  0.5 mL Intramuscular Tomorrow-1000  . sodium chloride flush  3 mL Intravenous Q12H  . tamsulosin  0.4 mg Oral QPC supper  . thiamine  100 mg Oral Daily   Or  . thiamine  100 mg Intravenous Daily   . sodium chloride Stopped (01/13/16 2100)  . [START ON 01/16/2016] sodium chloride     Followed by  . [START ON 01/16/2016] sodium chloride 1 mL/kg/hr (01/15/16 0759)  . heparin 2,450 Units/hr (01/15/16 0500)   OBJECTIVE  Filed Vitals:   01/15/16 0700 01/15/16 0759 01/15/16 0800 01/15/16 0916  BP: 134/80  132/76 110/72  Pulse: 66  66 71  Temp:  99.3 F (37.4 C)  98.5 F (36.9 C)  TempSrc:  Oral  Oral  Resp: 21  22 18   Height:    5\' 10"  (1.778 m)  Weight:    204 lb 9.6 oz (92.806 kg)  SpO2: 90%  91% 92%    Intake/Output Summary (Last 24 hours) at 01/15/16 0950 Last data filed at 01/15/16 0800  Gross per 24 hour  Intake 1283.19 ml  Output   1225 ml  Net  58.19 ml   Filed Weights   01/11/16 1319 01/15/16 0400 01/15/16 0916  Weight: 203 lb 4.2 oz (92.2 kg) 206 lb 9.1 oz (93.7 kg) 204 lb 9.6 oz (92.806 kg)    PHYSICAL EXAM  General: AAO x1, NAD. Neuro: Alert and oriented X 3. Moves all extremities spontaneously. Psych: Normal affect. HEENT:  Normal  Neck: Supple without bruits or JVD. Lungs:  Resp regular and unlabored, CTA. Heart: RRR no s3, s4, or murmurs. Abdomen: tender, distended, BS + x 4.  Extremities: No clubbing, cyanosis or edema. DP/PT/Radials 2+ and equal bilaterally.  Accessory Clinical Findings  CBC  Recent Labs  01/14/16 0332 01/15/16 0236  WBC 10.7* 12.1*  NEUTROABS 8.7* 8.8*  HGB 12.1* 12.9*  HCT 34.4* 37.0*  MCV 105.8* 102.5*  PLT 199 [id number]   Basic Metabolic Panel  Recent Labs  01/14/16 0332 01/15/16 0236  NA 141 140  K 3.4* 3.6  CL 105 102  CO2 25 28  GLUCOSE 97 108*  BUN 22* 13  CREATININE 1.16 1.03  CALCIUM 8.5* 8.6*  MG 2.1 1.6*  PHOS 2.1* 1.8*   Liver Function Tests  Recent Labs  01/14/16 0332 01/15/16 0236  ALBUMIN 2.2* 2.2*   No results for input(s): CKTOTAL, CKMB, CKMBINDEX, TROPONINI in the last 72 hours. Dg Chest 2 View  01/10/2016  CLINICAL DATA:  Left lower rib pain.  Fell on Wednesday. EXAM: CHEST  2 VIEW COMPARISON:  04/02/2015 FINDINGS: The heart is enlarged but stable. There is tortuosity of the thoracic aorta. Patchy bibasilar atelectasis or infiltrates. No effusions or edema. Possible nondisplaced lower left rib fractures. No pneumothorax or obvious pleural effusion. IMPRESSION: Cardiac enlargement and bibasilar infiltrates or atelectasis. Possible lower left rib fractures.   Ct Head Wo Contrast  01/10/2016  CLINICAL DATA:  Multiple falls.  Alcohol abuse.  IMPRESSION: No acute intracranial abnormality. Stable mild cerebral atrophy and chronic small vessel disease. Chronic bilateral maxillary sinusitis.  Electronically Signed   By: Earle Gell M.D.   On: 01/10/2016 18:23   Echo: 06/2014 Left ventricle: The cavity size was normal. Systolic function was normal. Wall motion was normal; there were no regional wall motion abnormalities. There was an increased relative contribution of atrial contraction to ventricular filling.  TELE: SR to ST, HR 94 - 102  ECG: ST, otherwise normal    ASSESSMENT AND PLAN  1. NSTEMI - may be cocaine induced myocardial ischemia versus non-STEMI 2. Positive drug screen for cocaine 3. Alcoholism with acute alcoholic intoxication and withdrawal 4. Coronary artery disease as manifest by CT scan with calcification previously 5. Depression 6. Fatty liver 7. Hypertension 8. Mnire's disease with hearing loss 9. Recent fall with rib fracture  RECOMMENDATION:  The patient admitted with chest pain and NSTEMI, troponin up to 4.59, he was intubated secondary to DTs, now extubated and stable. Echo shows normal LVEF. He is minimally active but gets SOB with minimal activities. A left sided cath scheduled for today.  Potassium now corrected.  Continue ASA, carvedilol, start atorvastatin.  Signed, Dorothy Spark MD, [facility] 01/15/2016

## 2016-01-16 DIAGNOSIS — F1010 Alcohol abuse, uncomplicated: Secondary | ICD-10-CM

## 2016-01-16 DIAGNOSIS — J15211 Pneumonia due to Methicillin susceptible Staphylococcus aureus: Secondary | ICD-10-CM | POA: Diagnosis present

## 2016-01-16 DIAGNOSIS — I2510 Atherosclerotic heart disease of native coronary artery without angina pectoris: Secondary | ICD-10-CM

## 2016-01-16 LAB — PHOSPHORUS: Phosphorus: 2.8 mg/dL (ref 2.5–4.6)

## 2016-01-16 LAB — CBC WITH DIFFERENTIAL/PLATELET
BASOS ABS: 0 10*3/uL (ref 0.0–0.1)
BASOS PCT: 0 %
Eosinophils Absolute: 0 10*3/uL (ref 0.0–0.7)
Eosinophils Relative: 0 %
HEMATOCRIT: 34.1 % — AB (ref 39.0–52.0)
HEMOGLOBIN: 12 g/dL — AB (ref 13.0–17.0)
LYMPHS PCT: 14 %
Lymphs Abs: 1.1 10*3/uL (ref 0.7–4.0)
MCH: 35.5 pg — ABNORMAL HIGH (ref 26.0–34.0)
MCHC: 35.2 g/dL (ref 30.0–36.0)
MCV: 100.9 fL — AB (ref 78.0–100.0)
MONO ABS: 1.1 10*3/uL — AB (ref 0.1–1.0)
Monocytes Relative: 14 %
NEUTROS ABS: 5.4 10*3/uL (ref 1.7–7.7)
NEUTROS PCT: 72 %
Platelets: 218 10*3/uL (ref 150–400)
RBC: 3.38 MIL/uL — AB (ref 4.22–5.81)
RDW: 13 % (ref 11.5–15.5)
WBC: 7.7 10*3/uL (ref 4.0–10.5)

## 2016-01-16 LAB — MAGNESIUM: Magnesium: 1.7 mg/dL (ref 1.7–2.4)

## 2016-01-16 LAB — COMPREHENSIVE METABOLIC PANEL
ALBUMIN: 2.2 g/dL — AB (ref 3.5–5.0)
ALK PHOS: 85 U/L (ref 38–126)
ALT: 44 U/L (ref 17–63)
ANION GAP: 14 (ref 5–15)
AST: 82 U/L — AB (ref 15–41)
BUN: 10 mg/dL (ref 6–20)
CALCIUM: 8.6 mg/dL — AB (ref 8.9–10.3)
CO2: 23 mmol/L (ref 22–32)
Chloride: 102 mmol/L (ref 101–111)
Creatinine, Ser: 0.92 mg/dL (ref 0.61–1.24)
GFR calc Af Amer: >60 mL/min (ref >60)
GFR calc non Af Amer: >60 mL/min (ref >60)
GLUCOSE: 116 mg/dL — AB (ref 65–99)
Potassium: 2.9 mmol/L — ABNORMAL LOW (ref 3.5–5.1)
SODIUM: 139 mmol/L (ref 135–145)
Total Bilirubin: 0.8 mg/dL (ref 0.3–1.2)
Total Protein: 5.8 g/dL — ABNORMAL LOW (ref 6.5–8.1)

## 2016-01-16 MED ORDER — STARCH (THICKENING) PO POWD
ORAL | Status: DC | PRN
  Filled 2016-01-16: qty 227

## 2016-01-16 MED ORDER — LISINOPRIL 5 MG PO TABS
5.0000 mg | ORAL_TABLET | Freq: Every day | ORAL | Status: DC
  Administered 2016-01-16: 5 mg via ORAL
  Filled 2016-01-16: qty 1

## 2016-01-16 MED ORDER — POTASSIUM PHOSPHATES 15 MMOLE/5ML IV SOLN
40.0000 meq | Freq: Once | INTRAVENOUS | Status: AC
  Administered 2016-01-16: 40 meq via INTRAVENOUS
  Filled 2016-01-16: qty 9.09

## 2016-01-16 MED ORDER — CARVEDILOL 12.5 MG PO TABS: 12.5000 mg | ORAL_TABLET | Freq: Two times a day (BID) | ORAL | Status: DC

## 2016-01-16 MED ORDER — CARVEDILOL 6.25 MG PO TABS
6.2500 mg | ORAL_TABLET | Freq: Two times a day (BID) | ORAL | Status: DC
  Administered 2016-01-16 – 2016-01-18 (×5): 6.25 mg
  Filled 2016-01-16 (×4): qty 1

## 2016-01-16 MED ORDER — DEXTROSE 5 % IV SOLN
3.0000 g | Freq: Once | INTRAVENOUS | Status: AC
  Administered 2016-01-16: 3 g via INTRAVENOUS
  Filled 2016-01-16: qty 6

## 2016-01-16 NOTE — Progress Notes (Addendum)
Patient Name: Joshua Henson Date of Encounter: 01/16/2016  Active Problems:   Elevated troponin   Chest pain   CAD (coronary artery disease), native coronary artery   Alcohol abuse with intoxication ([place])   Frequent falls   Left rib fracture   Acute encephalopathy   Polysubstance abuse   Delirium tremens ([place])   NSTEMI (non-ST elevated myocardial infarction) ([place])   Sepsis due to Enterococcus with acute renal failure and metabolic encephalopathy ([place])   Acute respiratory failure with hypoxia ([place])   CAD in native artery   Cocaine abuse   CAP (community acquired pneumonia) due to MSSA (methicillin sensitive Staphylococcus aureus) ([place])   Length of Stay: 5  SUBJECTIVE  The patient was extubated on 1/25, transferred to telemetry, had a lef cardiac cath yesterday, denies chest pain today. He appears confused and requires a Actuary.  CURRENT MEDS . aspirin  81 mg Per Tube Daily  . atorvastatin  40 mg Oral q1800  . carvedilol  6.25 mg Per Tube BID WC  .  ceFAZolin (ANCEF) IV  2 g Intravenous 3 times per day  . citalopram  20 mg Oral QHS  . folic acid  1 mg Oral Daily  . lisinopril  5 mg Oral Daily  . magnesium sulfate 1 - 4 g bolus IVPB  3 g Intravenous Once  . multivitamin with minerals  1 tablet Oral Daily  . nicotine  21 mg Transdermal Daily  . pneumococcal 23 valent vaccine  0.5 mL Intramuscular Tomorrow-1000  . potassium phosphate IVPB (mEq)  40 mEq Intravenous Once  . sodium chloride flush  3 mL Intravenous Q12H  . tamsulosin  0.4 mg Oral QPC supper  . thiamine  100 mg Oral Daily   Or  . thiamine  100 mg Intravenous Daily   . sodium chloride 10 mL/hr (01/16/16 0047)   OBJECTIVE  Filed Vitals:   01/16/16 0017 01/16/16 0535 01/16/16 0753 01/16/16 1158  BP: 138/66 145/88 155/80 131/68  Pulse: 62 69 72 72  Temp: 97.9 F (36.6 C) 97.8 F (36.6 C) 98.5 F (36.9 C) 98.6 F (37 C)  TempSrc: Oral Oral Oral Oral  Resp: 22  20 20   Height:      Weight:        SpO2: 90% 87% 94% 90%    Intake/Output Summary (Last 24 hours) at 01/16/16 1227 Last data filed at 01/16/16 0700  Gross per 24 hour  Intake 462.17 ml  Output    550 ml  Net -87.83 ml   Filed Weights   01/11/16 1319 01/15/16 0400 01/15/16 0916  Weight: 203 lb 4.2 oz (92.2 kg) 206 lb 9.1 oz (93.7 kg) 204 lb 9.6 oz (92.806 kg)    PHYSICAL EXAM  General: AAO x1, NAD. Neuro: Alert and oriented X 3. Moves all extremities spontaneously. Psych: Normal affect. HEENT:  Normal  Neck: Supple without bruits or JVD. Lungs:  Resp regular and unlabored, CTA. Heart: RRR no s3, s4, or murmurs. Abdomen: tender, distended, BS + x 4.  Extremities: No clubbing, cyanosis or edema. DP/PT/Radials 2+ and equal bilaterally.  Accessory Clinical Findings  CBC  Recent Labs  01/15/16 0236 01/16/16 0327  WBC 12.1* 7.7  NEUTROABS 8.8* 5.4  HGB 12.9* 12.0*  HCT 37.0* 34.1*  MCV 102.5* 100.9*  PLT 229 [id number]   Basic Metabolic Panel  Recent Labs  01/15/16 0236 01/16/16 0327  NA 140 139  K 3.6 2.9*  CL 102 102  CO2 28 23  GLUCOSE 108* 116*  BUN 13 10  CREATININE 1.03 0.92  CALCIUM 8.6* 8.6*  MG 1.6* 1.7  PHOS 1.8* 2.8   Liver Function Tests  Recent Labs  01/15/16 0236 01/16/16 0327  AST  --  82*  ALT  --  44  ALKPHOS  --  85  BILITOT  --  0.8  PROT  --  5.8*  ALBUMIN 2.2* 2.2*   01/10/2016  CLINICAL DATA:  Left lower rib pain.  Fell on Wednesday. EXAM: CHEST  2 VIEW COMPARISON:  04/02/2015 FINDINGS: The heart is enlarged but stable. There is tortuosity of the thoracic aorta. Patchy bibasilar atelectasis or infiltrates. No effusions or edema. Possible nondisplaced lower left rib fractures. No pneumothorax or obvious pleural effusion. IMPRESSION: Cardiac enlargement and bibasilar infiltrates or atelectasis. Possible lower left rib fractures.   Ct Head Wo Contrast  01/10/2016  CLINICAL DATA:  Multiple falls.  Alcohol abuse.  IMPRESSION: No acute intracranial abnormality. Stable mild  cerebral atrophy and chronic small vessel disease. Chronic bilateral maxillary sinusitis. Electronically Signed   By: Earle Gell M.D.   On: 01/10/2016 18:23   Echo: 06/2014 Left ventricle: The cavity size was normal. Systolic function was normal. Wall motion was normal; there were no regional wall motion abnormalities. There was an increased relative contribution of atrial contraction to ventricular filling.  TELE: SR to ST, HR 94 - 102  ECG: ST, otherwise normal  Left cardiac cath: 01/15/2016 1. Moderately severe stenosis in the mid segment of the non-dominant RCA.  2. Mild non-obstructive disease in the LAD and Circumflex  Recommendations: I have had a long discussion today with his girlfriend who is his HCPOA. She reports that he has had no exertional chest pain at home. He was admitted due to alcohol intoxication and withdrawal. He has had a complicated hospital course due to delirium tremens with respiratory failure/aspiration pneumonia. Troponin was elevated, likely due to demand ischemia. While he does have a 60-70% stenosis in the small to moderate caliber mid RCA, I do not think this is clinically significant. The RCA is a non-dominant vessel. I would recommend medical therapy for CAD at this time. He would seem to be a poor candidate for a coronary stent given his alcohol abuse and possible abuse of other substances (benzo/cocaine). I would restart his statin and continue ASA along with a beta blocker.     ASSESSMENT AND PLAN  1. NSTEMI - may be cocaine induced myocardial ischemia versus non-STEMI 2. Positive drug screen for cocaine 3. Alcoholism with acute alcoholic intoxication and withdrawal 4. Coronary artery disease as manifest by CT scan with calcification previously 5. Depression 6. Fatty liver 7. Hypertension 8. Mnire's disease with hearing loss 9. Recent fall with rib fracture  RECOMMENDATION:  The patient admitted with chest pain and NSTEMI,  troponin up to 4.59, he was intubated secondary to DTs, now extubated and stable. Echo shows normal LVEF.  Left cardiac cath showed moderate non-obstructive CAD, we will initiate an aggressive medical therapy. Advised to stay away from cocaine. Continue statin and continue ASA along with a beta blocker.   We will sign off, we will arrange for a clinic follow up.   Signed, Dorothy Spark MD, [facility] 01/16/2016

## 2016-01-16 NOTE — Progress Notes (Addendum)
Speech Language Pathology Treatment: Dysphagia  Patient Details Name: Joshua Henson MRN: [id number] DOB: 1946/10/15 Today's Date: 01/16/2016 Time: [id number] SLP Time Calculation (min) (ACUTE ONLY): 16 min  Assessment / Plan / Recommendation Clinical Impression  Pt seen at bedside for determination of current safest least restrictive diet. Pt slightly agitated but cooperative to PO trials. Work of breathing appeared somewhat effortful though oxygen saturation stable. Oral cavity xerostomic which improved with oral care. Pt continues to exhibit intermittent signs and symptoms of reduced airway protection with thin liquids via teaspoon, cup, and straw sip including throat clearing, delayed coughing, and intermittent wet vocal quality. No signs or symptoms of aspiration with nectar thick liquids. Chin tuck also seemed to be effective with thin liquids however suspect poor independent carryover. Given decreased respiratory compromise, hx of aspiration PNA, and pt impulsivity recommend conservative dysphagia 3 (mechancial soft) and nectar thick liquids with medicines whole in puree. Recommend [employer] Protocol  (water in between meals after oral care).  ST to follow up for continued upgraded PO trials. Objective swallow study may be indicated in the future if symptoms persist.   HPI HPI: Joshua Henson is a 70 y.o. male with ETOH use and polysubstance abuse (UDS positive for cocaine, opiates, benzo's). He [facility] ED [facility]/22 with increase in quantity of mechanical falls as well as chest pain. He drinks roughly 1 pint of liquor per day In ED, he complained of back and knee pain due to his falls as well as left sided chest pain that was sharp in nature. Pain was exacerbated by deep inspiration and positional changes and was relieved by staying still or using pain meds. Girlfriend also reported confusion as well as memory loss over the previous 2 days. He has had admissions in the past  related to ETOH withdrawal as well as ETOH withdrawal seizures. He was admitted for ETOH intoxication as well as NSTEMI thought to be due to cocaine induced ischemia vs NSTEMI. Intubated 1/23-1/25.       SLP Plan  Continue with current plan of care     Recommendations  Diet recommendations: Nectar-thick liquid;Dysphagia 3 (mechanical soft) Liquids provided via: Cup;Straw Medication Administration: Whole meds with puree Supervision: Staff to assist with self feeding;Full supervision/cueing for compensatory strategies Compensations: Slow rate;Small sips/bites Postural Changes and/or Swallow Maneuvers: Seated upright 90 degrees             Oral Care Recommendations: Oral care BID Follow up Recommendations: 24 hour supervision/assistance Plan: Continue with current plan of care     [place] MA, [place] Language Pathologist     Levi Aland 01/16/2016, 10:02 AM

## 2016-01-16 NOTE — Progress Notes (Signed)
[place] TEAM 1 - Stepdown/ICU TEAM Progress Note  Joshua Henson [id number] DOB: 09-01-1946 DOA: 01/10/2016 PCP: Adella Hare, MD  Admit HPI / Brief Narrative: 70 year old WM PMHx Polysubstance Abuse (Alcohol Abuse Cocaine), HTN, CVA, Prostate Cancer S/P XRT,Hearing loss 2/2 Mnire's disease, CKD;   who presents with a history of increasing falls and chest pain. Patient is somewhat of a poor historian and his live-in girlfriend provides much of his history. Patient in the recent weeks has been drinking more. A couple days ago when she has come home from work she notes that he fell on the [place] and table and broke and it. She notes that he has a previous history of seizures. Patient had reported left-sided chest pain and he had bruises of his knees. She notes last few weeks that she's noticed a rapid loss of his muscle mass in his lower extremities. Patient notes that he has left-sided chest pain more so than the right. Pain is sharp in nature. Worse with deep inspiration or trying to sit up or changes positions. Relieved by staying still and pain medication. His girlfriend also noticed that he's been more confused with memory loss in the last 2 days. He actually called his daughter today and was talking so confused that she called the patient's girlfriend to see if everything was all right. Review of patient's record shows that he's had multiple previous admissions for alcoholism suffering previous alcohol withdrawal seizures.  Upon arrival to the emergency department patient is evaluated with a CT scan of the head which shows no acute abnormalities. Patient was found to have relatively normal blood work except for Alcohol level 210, elevated troponin of 4.59, but normal EKG. Cardiology was consulted and evaluated the patient and recommended inpatient admission trending cardiac enzymes and check CK level.   HPI/Subjective: 1/28 A/O 4, much calmer, able to follow directions,  noncombative. Patient somewhat confused still, wants to know where his children are, does not recall his girlfriend being with him last night. Also talks about contacting a previous wife.    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 Secondary to patient's substance abuse per cardiology placing stent was not appropriate. Recommend medical management -1/28 Awaiting cardiology's recommendations from today on follow-up  Acute Hypoxic Respiratory Failure Requiring Intubation - Likely secondary to aspiration. -Extubated 1/25 - Breathing well on room air -Incentive Spirometry, Flutter valve -SLP evaluated post-extubation -> recommend Dysphagia 3 diet nectar thick liquid  Aspiration Pneumonia  -Tracheal aspirate positive  Methicillin Sensitive Staph aureus -Completed Unasyn  4 days and then was switched to Cefazolin for total treatment duration of 10 days -Now that patient more cooperative obtain swallow study -NOTE; Hx of C. diff  Acute Encephalopathy - -Most likely secondary to Polysubstance abuse - UDS positive for cocaine, opiates, benzo's. -Patient is A/O 4 however extremely combative, agitated flights of thought, threatening to tear off Hemostasis band on his arm over the catheterization site even though he was warned this could result in terminal bleeding. -Request safety sitter -Ms patient's significant other [name] [id number] arrived and was able to help calm patient, and convince patient to remain in the hospital. -Continue CIWA protocol -Continue Haldol 5 mg QID PRN agitation -Continue thiamine /  folate / multivitamin. -Could consider Substance and ETOH abuse counseling  after patient completes withdraw -Hold outpatient bupropion, chlordiazepoxide, norco.  Depression -Continue citalopram 20 mg QHS  Tobacco use -Nicotine Patch 21mg   AKI - Resolved   Hypokalemia -Potassium goal>4 -K-Phos 40 mEq 1  Hypomagnesemia  -magnesium goal > 2  -Magnesium 3 gm  Hypophosphatemia -See hypokalemia    Code Status: FULL Family Communication: significant other [name] [phone] present at time of exam Disposition Plan: SNF?    Consultants: Dr.[name] D [name] [facility]    Procedure/Significant Events: 01/22 > admitted with ETOH withdrawal. 1/22 CT Head w/o : No acute intracranial process. 01/23 > PCCM consulted for transfer to ICU for initiation of precedex. Required intubation for respiratory insufficiency. 1/23 PCXR: ETT 5cm above carina. Patchy airspace opacity bilateral bases. No effusion. 1/24 TTE : LV normal in size with mild LVH. EF 55%. Inadequate to assess wall motion. Grade 1 diastolic dysfunction. RV normal in size and function. 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 -Mild non-obstructive disease in the LAD and Circumflex  Culture 1/23 Tracheal Asp positive: MSSA 1/23 MRSA nares Negative 1/24 Urine Strep Antigen  Negative 1/24 Urine Legionella Antigen Negative   Antibiotics: Unasyn 1/23 >> 1/26 Cefazolin 1/26 >>  DVT prophylaxis: Heparin drip, SCD   Devices Right arm catheter band  LINES / TUBES:  OETT 8.0 1/23>> 1/25 Foley 1/23>> 1/25 PIV x2    Continuous Infusions: . sodium chloride 10 mL/hr (01/16/16 0047)    Objective: VITAL SIGNS: Temp: 98.5 F (36.9 C) (01/28 0753) Temp Source: Oral (01/28 0753) BP: 155/80 mmHg (01/28 0753) Pulse Rate: 72 (01/28 0753) SPO2; FIO2:   Intake/Output Summary (Last 24 hours) at 01/16/16 0955 Last data filed at 01/16/16 0700  Gross per 24 hour  Intake 462.17 ml  Output     550 ml  Net -87.83 ml     Exam: General: A/O 4 cooperative, somewhat confused, follows commands, NAD. No acute respiratory distress Eyes: Negative headache,  scleral hemorrhage ENT: Negative Runny nose, negative gingival bleeding, Neck:  Negative scars, masses, torticollis, lymphadenopathy, JVD Lungs: Clear to auscultation bilaterally without wheezes or crackles Cardiovascular: Regular rate and rhythm without murmur gallop or rub normal S1 and S2 Abdomen:negative abdominal pain, nondistended, positive soft, bowel sounds, no rebound, no ascites, no appreciable mass Extremities: No significant cyanosis, clubbing, or edema bilateral lower extremities Psychiatric:  Negative depression, negative anxiety, negative fatigue, negative mania, positive with draws (agitated, violent, illogical)  Neurologic:  Cranial nerves II through XII intact, tongue/uvula midline, all extremities muscle strength 5/5, sensation intact throughout, negative dysarthria, negative expressive aphasia, negative receptive aphasia.   Data Reviewed: Basic Metabolic Panel:  Recent Labs Lab 01/12/16 1405 01/13/16 0253 01/13/16 1155 01/14/16 0332 01/15/16 0236 01/16/16 0327  NA 137 131* 140 141 140 139  K 3.2* 3.2* 3.9 3.4* 3.6 2.9*  CL 102 99* 105 105 102 102  CO2 24 21* 24 25 28 23   GLUCOSE 142* 114* 103* 97 108* 116*  BUN 10 18 22* 22* 13 10  CREATININE 0.79 1.40* 1.43* 1.16 1.03 0.92  CALCIUM 8.3* 7.6* 8.2* 8.5* 8.6* 8.6*  MG 2.0 2.0  --  2.1 1.6* 1.7  PHOS 2.6 2.8 3.3 2.1* 1.8* 2.8   Liver Function Tests:  Recent Labs Lab 01/10/16 1712  01/13/16 0253 01/13/16 1155 01/14/16 0332 01/15/16 0236 01/16/16 0327  AST 24  --   --   --   --   --  82*  ALT 22  --   --   --   --   --  44  ALKPHOS 57  --   --   --   --   --  85  BILITOT 0.7  --   --   --   --   --  0.8  PROT 6.1*  --   --   --   --   --  5.8*  ALBUMIN 3.3*  < > 2.5* 2.6* 2.2* 2.2* 2.2*  < > = values in this interval not displayed. No  results for input(s): LIPASE, AMYLASE in the last 168 hours. No results for input(s): AMMONIA in the last 168 hours. CBC:  Recent Labs Lab 01/12/16 0219 01/13/16 0253 01/14/16 0332 01/15/16 0236 01/16/16 0327  WBC 8.1 11.5* 10.7* 12.1* 7.7  NEUTROABS  --  9.4* 8.7* 8.8* 5.4  HGB 13.5 12.6* 12.1* 12.9* 12.0*  HCT 38.7* 35.8* 34.4* 37.0* 34.1*  MCV 102.1* 102.3* 105.8* 102.5* 100.9*  PLT 119* 197 199 229 218   Cardiac Enzymes:  Recent Labs Lab 01/10/16 1712 01/10/16 2322 01/11/16 0236 01/11/16 0528  CKTOTAL 47*  --   --  82  CKMB 1.3  --   --   --   TROPONINI 4.59* 4.53* 4.35* 4.46*   BNP (last 3 results) No results for input(s): BNP in the last 8760 hours.  ProBNP (last 3 results) No results for input(s): PROBNP in the last 8760 hours.  CBG:  Recent Labs Lab 01/11/16 1329  GLUCAP 137*    Recent Results (from the past 240 hour(s))  MRSA PCR Screening     Status: None   Collection Time: 01/11/16  1:25 PM  Result Value Ref Range Status   MRSA by PCR NEGATIVE NEGATIVE Final    Comment:        The GeneXpert MRSA Assay (FDA approved for NASAL specimens only), is one component of a comprehensive MRSA colonization surveillance program. It is not intended to diagnose MRSA infection nor to guide or monitor treatment for MRSA infections.   Culture, respiratory (NON-Expectorated)     Status: None   Collection Time: 01/11/16 10:50 PM  Result Value Ref Range Status   Specimen Description TRACHEAL ASPIRATE  Final   Special Requests NONE  Final   Gram Stain   Final    ABUNDANT WBC PRESENT, PREDOMINANTLY PMN NO SQUAMOUS EPITHELIAL CELLS SEEN ABUNDANT GRAM POSITIVE COCCI IN CLUSTERS Performed at [facility]    Culture   Final    ABUNDANT STAPHYLOCOCCUS AUREUS Note: RIFAMPIN AND GENTAMICIN SHOULD NOT BE USED AS SINGLE DRUGS FOR TREATMENT OF STAPH INFECTIONS. This organism is presumed to be Clindamycin resistant based on detection of inducible Clindamycin  resistance. Performed at [facility]    Report Status 01/14/2016 FINAL  Final   Organism ID, Bacteria STAPHYLOCOCCUS AUREUS  Final      Susceptibility   Staphylococcus aureus - MIC*    CLINDAMYCIN RESISTANT      ERYTHROMYCIN >=8 RESISTANT Resistant     GENTAMICIN <=0.5 SENSITIVE Sensitive     LEVOFLOXACIN 0.25 SENSITIVE Sensitive     OXACILLIN 0.5 SENSITIVE Sensitive     RIFAMPIN <=0.5 SENSITIVE Sensitive     TRIMETH/SULFA <=10 SENSITIVE Sensitive     VANCOMYCIN 1 SENSITIVE Sensitive     TETRACYCLINE <=1 SENSITIVE Sensitive     MOXIFLOXACIN <=0.25 SENSITIVE Sensitive     * ABUNDANT STAPHYLOCOCCUS AUREUS     Studies:  Recent x-ray studies  have been reviewed in detail by the Attending Physician  Scheduled Meds:  Scheduled Meds: . aspirin  81 mg Per Tube Daily  . atorvastatin  40 mg Oral q1800  . carvedilol  6.25 mg Per Tube BID WC  .  ceFAZolin (ANCEF) IV  2 g Intravenous 3 times per day  . citalopram  20 mg Oral QHS  . folic acid  1 mg Oral Daily  . lisinopril  5 mg Oral Daily  . magnesium sulfate 1 - 4 g bolus IVPB  3 g Intravenous Once  . multivitamin with minerals  1 tablet Oral Daily  . nicotine  21 mg Transdermal Daily  . pneumococcal 23 valent vaccine  0.5 mL Intramuscular Tomorrow-1000  . potassium phosphate IVPB (mEq)  40 mEq Intravenous Once  . sodium chloride flush  3 mL Intravenous Q12H  . tamsulosin  0.4 mg Oral QPC supper  . thiamine  100 mg Oral Daily   Or  . thiamine  100 mg Intravenous Daily    Time spent on care of this patient: 40 mins   Xiamara Hulet, Geraldo Docker , MD  [facility] Office  [phone] Pager - [phone]  On-Call/Text Page:      [email or web address]      password [email or web address]  If 7PM-7AM, please contact night-coverage [email or web address] Password [email or web address] 01/16/2016, 9:55 AM   LOS: 5 days   Care during the described time interval was provided by me .  I have reviewed this patient's available data, including medical history, events of note,  physical examination, and all test results as part of my evaluation. I have personally reviewed and interpreted all radiology studies.   Dia Crawford, MD [phone] Pager

## 2016-01-17 LAB — RENAL FUNCTION PANEL
Albumin: 2 g/dL — ABNORMAL LOW (ref 3.5–5.0)
Anion gap: 11 (ref 5–15)
BUN: 8 mg/dL (ref 6–20)
CHLORIDE: 102 mmol/L (ref 101–111)
CO2: 25 mmol/L (ref 22–32)
Calcium: 8.2 mg/dL — ABNORMAL LOW (ref 8.9–10.3)
Creatinine, Ser: 0.85 mg/dL (ref 0.61–1.24)
Glucose, Bld: 126 mg/dL — ABNORMAL HIGH (ref 65–99)
POTASSIUM: 2.8 mmol/L — AB (ref 3.5–5.1)
Phosphorus: 3.1 mg/dL (ref 2.5–4.6)
Sodium: 138 mmol/L (ref 135–145)

## 2016-01-17 LAB — CBC WITH DIFFERENTIAL/PLATELET
Basophils Absolute: 0 10*3/uL (ref 0.0–0.1)
Basophils Relative: 0 %
EOS PCT: 1 %
Eosinophils Absolute: 0.1 10*3/uL (ref 0.0–0.7)
HCT: 32.7 % — ABNORMAL LOW (ref 39.0–52.0)
HEMOGLOBIN: 11.6 g/dL — AB (ref 13.0–17.0)
LYMPHS ABS: 1.2 10*3/uL (ref 0.7–4.0)
LYMPHS PCT: 19 %
MCH: 36.1 pg — AB (ref 26.0–34.0)
MCHC: 35.5 g/dL (ref 30.0–36.0)
MCV: 101.9 fL — AB (ref 78.0–100.0)
MONOS PCT: 13 %
Monocytes Absolute: 0.8 10*3/uL (ref 0.1–1.0)
NEUTROS PCT: 67 %
Neutro Abs: 4 10*3/uL (ref 1.7–7.7)
Platelets: 205 10*3/uL (ref 150–400)
RBC: 3.21 MIL/uL — AB (ref 4.22–5.81)
RDW: 13.2 % (ref 11.5–15.5)
WBC: 6 10*3/uL (ref 4.0–10.5)

## 2016-01-17 LAB — MAGNESIUM: Magnesium: 1.9 mg/dL (ref 1.7–2.4)

## 2016-01-17 MED ORDER — MAGNESIUM SULFATE 2 GM/50ML IV SOLN
2.0000 g | Freq: Once | INTRAVENOUS | Status: AC
  Administered 2016-01-17: 2 g via INTRAVENOUS
  Filled 2016-01-17: qty 50

## 2016-01-17 MED ORDER — CEFUROXIME AXETIL 500 MG PO TABS: 500.0000 mg | ORAL_TABLET | Freq: Two times a day (BID) | ORAL | Status: DC

## 2016-01-17 MED ORDER — LISINOPRIL 10 MG PO TABS
10.0000 mg | ORAL_TABLET | Freq: Every day | ORAL | Status: DC
  Administered 2016-01-17 – 2016-01-19 (×3): 10 mg via ORAL
  Filled 2016-01-17 (×3): qty 1

## 2016-01-17 MED ORDER — ASPIRIN 81 MG PO CHEW
81.0000 mg | CHEWABLE_TABLET | Freq: Every day | ORAL | Status: DC
  Administered 2016-01-17 – 2016-01-19 (×3): 81 mg via ORAL
  Filled 2016-01-17 (×3): qty 1

## 2016-01-17 MED ORDER — CEFUROXIME AXETIL 500 MG PO TABS
500.0000 mg | ORAL_TABLET | Freq: Two times a day (BID) | ORAL | Status: DC
  Administered 2016-01-17: 500 mg via ORAL
  Filled 2016-01-17: qty 1

## 2016-01-17 MED ORDER — POTASSIUM CHLORIDE CRYS ER 20 MEQ PO TBCR
50.0000 meq | EXTENDED_RELEASE_TABLET | Freq: Two times a day (BID) | ORAL | Status: AC
  Administered 2016-01-17 (×2): 50 meq via ORAL
  Filled 2016-01-17 (×2): qty 2

## 2016-01-17 NOTE — Progress Notes (Signed)
[place] TEAM 1 - Stepdown/ICU TEAM Progress Note  Joshua Henson [id number] DOB: 09/18/1946 DOA: 01/10/2016 PCP: Adella Hare, MD  Admit HPI / Brief Narrative: 70 year old WM PMHx Polysubstance Abuse (Alcohol Abuse Cocaine), HTN, CVA, Prostate Cancer S/P XRT,Hearing loss 2/2 Mnire's disease, CKD;   who presents with a history of increasing falls and chest pain. Patient is somewhat of a poor historian and his live-in girlfriend provides much of his history. Patient in the recent weeks has been drinking more. A couple days ago when she has come home from work she notes that he fell on the [place] and table and broke and it. She notes that he has a previous history of seizures. Patient had reported left-sided chest pain and he had bruises of his knees. She notes last few weeks that she's noticed a rapid loss of his muscle mass in his lower extremities. Patient notes that he has left-sided chest pain more so than the right. Pain is sharp in nature. Worse with deep inspiration or trying to sit up or changes positions. Relieved by staying still and pain medication. His girlfriend also noticed that he's been more confused with memory loss in the last 2 days. He actually called his daughter today and was talking so confused that she called the patient's girlfriend to see if everything was all right. Review of patient's record shows that he's had multiple previous admissions for alcoholism suffering previous alcohol withdrawal seizures.  Upon arrival to the emergency department patient is evaluated with a CT scan of the head which shows no acute abnormalities. Patient was found to have relatively normal blood work except for Alcohol level 210, elevated troponin of 4.59, but normal EKG. Cardiology was consulted and evaluated the patient and recommended inpatient admission trending cardiac enzymes and check CK level.   HPI/Subjective: 1/29 A/O 4, much calmer, able to follow directions,  noncombative. Patient somewhat confused still, wants to know where his children are, does not recall his girlfriend being with him last night. Also talks about contacting a previous wife.    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 Secondary to patient's substance abuse per cardiology placing stent was not appropriate. Recommend medical management -1/29 cardiology to arrange for follow-up   Acute Hypoxic Respiratory Failure Requiring Intubation - Likely secondary to aspiration. -Extubated 1/25 - Breathing well on room air -Incentive Spirometry, Flutter valve -SLP evaluated post-extubation -> recommend Dysphagia 3 diet nectar thick liquid  Aspiration Pneumonia  -Tracheal aspirate positive  Methicillin Sensitive Staph aureus -Completed Unasyn  4 days and then was switched to Cefazolin. Will switch to Ceftin to complete 10 days of antibiotics -NOTE; Hx of C. diff  Acute Encephalopathy - -Most likely secondary to Polysubstance abuse - UDS positive for cocaine, opiates, benzo's. -Resolved -Social work; provide patient resources for outpatient drug/alcohol abuse rehabilitation prior to discharge.  -Continue CIWA protocol; patient has not required use today most likely over withdrawal -Continue Haldol 5 mg QID PRN agitation; patient has not required use today most likely over withdrawal -Continue thiamine / folate / multivitamin. -Could consider Substance and ETOH abuse counseling after patient completes withdraw -Hold outpatient bupropion, chlordiazepoxide, norco.  Depression -Continue citalopram 20 mg QHS  Tobacco use -Nicotine Patch 21mg   AKI - Resolved   Hypokalemia -Potassium  goal>4 -K-Dur 50 mEq 2 doses  Hypomagnesemia  -magnesium goal > 2  -Magnesium IV 2 gm  Hypophosphatemia -See hypokalemia    Code Status: FULL Family Communication: None present; significant other [name] [id number] HCPOA Disposition Plan: SNF?    Consultants: Dr.[name] D [name] [facility]    Procedure/Significant Events: 01/22 > admitted with ETOH withdrawal. 1/22 CT Head w/o : No acute intracranial process. 01/23 > PCCM consulted for transfer to ICU for initiation of precedex. Required intubation for respiratory insufficiency. 1/23 PCXR: ETT 5cm above carina. Patchy airspace opacity bilateral bases. No effusion. 1/24 TTE : LV normal in size with mild LVH. EF 55%. Inadequate to assess wall motion. Grade 1 diastolic dysfunction. RV normal in size and function. 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 -Mild non-obstructive disease in the LAD and Circumflex  Culture 1/23 Tracheal Asp positive: MSSA 1/23 MRSA nares Negative 1/24 Urine Strep Antigen  Negative 1/24 Urine Legionella Antigen Negative   Antibiotics: Unasyn 1/23 >> 1/26 Cefazolin 1/26 >> 1/29 Ceftin 1/29>>   DVT prophylaxis: Heparin drip, SCD   Devices Right arm catheter band  LINES / TUBES:  OETT 8.0 1/23>> 1/25 Foley 1/23>> 1/25 PIV x2    Continuous Infusions: . sodium chloride 10 mL/hr (01/16/16 0047)    Objective: VITAL SIGNS: Temp: 99.3 F (37.4 C) (01/29 0900) Temp Source: Oral (01/29 0900) BP: 151/78 mmHg (01/29 0900) Pulse Rate: 76 (01/29 0900) SPO2; FIO2:   Intake/Output Summary (Last 24 hours) at 01/17/16 1923 Last data filed at 01/17/16 1500  Gross per 24 hour  Intake    990 ml  Output    300 ml  Net    690 ml     Exam: General: A/O 4 cooperative, follows commands, NAD. No acute respiratory distress Eyes: Negative headache,  scleral hemorrhage ENT: Negative Runny  nose, negative gingival bleeding, Neck:  Negative scars, masses, torticollis, lymphadenopathy, JVD Lungs: Clear to auscultation bilaterally without wheezes or crackles Cardiovascular: Regular rate and rhythm without murmur gallop or rub normal S1 and S2 Abdomen:negative abdominal pain, nondistended, positive soft, bowel sounds, no rebound, no ascites, no appreciable mass Extremities: No significant cyanosis, clubbing, or edema bilateral lower extremities Psychiatric:  Negative depression, negative anxiety, negative fatigue, negative mania, positive with draws (agitated, violent, illogical)  Neurologic:  Cranial nerves II through XII intact, tongue/uvula midline, all extremities muscle strength 5/5, sensation intact throughout, negative dysarthria, negative expressive aphasia, negative receptive aphasia.   Data Reviewed: Basic Metabolic Panel:  Recent Labs Lab 01/13/16 0253 01/13/16 1155 01/14/16 0332 01/15/16 0236 01/16/16 0327 01/17/16 0355  NA 131* 140 141 140 139 138  K 3.2* 3.9 3.4* 3.6 2.9* 2.8*  CL 99* 105 105 102 102 102  CO2 21* 24 25 28 23 25   GLUCOSE 114* 103* 97 108* 116* 126*  BUN 18 22* 22* 13 10 8   CREATININE 1.40* 1.43* 1.16 1.03 0.92 0.85  CALCIUM 7.6* 8.2* 8.5* 8.6* 8.6* 8.2*  MG 2.0  --  2.1 1.6* 1.7 1.9  PHOS 2.8 3.3 2.1* 1.8* 2.8 3.1   Liver Function Tests:  Recent Labs Lab 01/13/16 1155 01/14/16 0332 01/15/16 0236 01/16/16 0327 01/17/16 0355  AST  --   --   --  82*  --   ALT  --   --   --  44  --   ALKPHOS  --   --   --  85  --   BILITOT  --   --   --  0.8  --   PROT  --   --   --  5.8*  --   ALBUMIN 2.6* 2.2* 2.2* 2.2* 2.0*   No results for input(s): LIPASE, AMYLASE in the last 168 hours. No results for input(s): AMMONIA in the last 168 hours. CBC:  Recent Labs Lab 01/13/16 0253 01/14/16 0332 01/15/16 0236 01/16/16 0327 01/17/16 0355  WBC 11.5* 10.7* 12.1* 7.7 6.0  NEUTROABS 9.4* 8.7* 8.8* 5.4 4.0  HGB 12.6* 12.1* 12.9* 12.0* 11.6*    HCT 35.8* 34.4* 37.0* 34.1* 32.7*  MCV 102.3* 105.8* 102.5* 100.9* 101.9*  PLT 197 199 229 218 205   Cardiac Enzymes:  Recent Labs Lab 01/10/16 2322 01/11/16 0236 01/11/16 0528  CKTOTAL  --   --  82  TROPONINI 4.53* 4.35* 4.46*   BNP (last 3 results) No results for input(s): BNP in the last 8760 hours.  ProBNP (last 3 results) No results for input(s): PROBNP in the last 8760 hours.  CBG:  Recent Labs Lab 01/11/16 1329  GLUCAP 137*    Recent Results (from the past 240 hour(s))  MRSA PCR Screening     Status: None   Collection Time: 01/11/16  1:25 PM  Result Value Ref Range Status   MRSA by PCR NEGATIVE NEGATIVE Final    Comment:        The GeneXpert MRSA Assay (FDA approved for NASAL specimens only), is one component of a comprehensive MRSA colonization surveillance program. It is not intended to diagnose MRSA infection nor to guide or monitor treatment for MRSA infections.   Culture, respiratory (NON-Expectorated)     Status: None   Collection Time: 01/11/16 10:50 PM  Result Value Ref Range Status   Specimen Description TRACHEAL ASPIRATE  Final   Special Requests NONE  Final   Gram Stain   Final    ABUNDANT WBC PRESENT, PREDOMINANTLY PMN NO SQUAMOUS EPITHELIAL CELLS SEEN ABUNDANT GRAM POSITIVE COCCI IN CLUSTERS Performed at [facility]    Culture   Final    ABUNDANT STAPHYLOCOCCUS AUREUS Note: RIFAMPIN AND GENTAMICIN SHOULD NOT BE USED AS SINGLE DRUGS FOR TREATMENT OF STAPH INFECTIONS. This organism is presumed to be Clindamycin resistant based on detection of inducible Clindamycin resistance. Performed at [facility]    Report Status 01/14/2016 FINAL  Final   Organism ID, Bacteria STAPHYLOCOCCUS AUREUS  Final      Susceptibility   Staphylococcus aureus - MIC*    CLINDAMYCIN RESISTANT      ERYTHROMYCIN >=8 RESISTANT Resistant     GENTAMICIN <=0.5 SENSITIVE Sensitive     LEVOFLOXACIN 0.25 SENSITIVE Sensitive     OXACILLIN 0.5  SENSITIVE Sensitive     RIFAMPIN <=0.5 SENSITIVE Sensitive     TRIMETH/SULFA <=10 SENSITIVE Sensitive     VANCOMYCIN 1 SENSITIVE Sensitive     TETRACYCLINE <=1 SENSITIVE Sensitive     MOXIFLOXACIN <=0.25 SENSITIVE Sensitive     * ABUNDANT STAPHYLOCOCCUS AUREUS     Studies:  Recent x-ray studies have been reviewed in detail by the Attending Physician  Scheduled Meds:  Scheduled Meds: . aspirin  81 mg Oral Daily  . atorvastatin  40 mg Oral q1800  . carvedilol  6.25 mg Per Tube BID WC  . cefUROXime  500 mg Oral BID WC  . citalopram  20 mg Oral QHS  . folic acid  1 mg Oral Daily  . lisinopril  10 mg  Oral Daily  . multivitamin with minerals  1 tablet Oral Daily  . nicotine  21 mg Transdermal Daily  . pneumococcal 23 valent vaccine  0.5 mL Intramuscular Tomorrow-1000  . potassium chloride  50 mEq Oral BID  . sodium chloride flush  3 mL Intravenous Q12H  . tamsulosin  0.4 mg Oral QPC supper  . thiamine  100 mg Oral Daily    Time spent on care of this patient: 40 mins   Pearla Mckinny, Geraldo Docker , MD  [facility] Office  [phone] Pager [phone]  On-Call/Text Page:      [email or web address]      password [email or web address]  If 7PM-7AM, please contact night-coverage [email or web address] Password [email or web address] 01/17/2016, 7:23 PM   LOS: 6 days   Care during the described time interval was provided by me .  I have reviewed this patient's available data, including medical history, events of note, physical examination, and all test results as part of my evaluation. I have personally reviewed and interpreted all radiology studies.   Dia Crawford, MD [phone] Pager

## 2016-01-18 ENCOUNTER — Encounter (HOSPITAL_COMMUNITY): Payer: Self-pay | Admitting: Cardiovascular Disease

## 2016-01-18 LAB — RENAL FUNCTION PANEL
Albumin: 2 g/dL — ABNORMAL LOW (ref 3.5–5.0)
Anion gap: 10 (ref 5–15)
BUN: 10 mg/dL (ref 6–20)
CHLORIDE: 106 mmol/L (ref 101–111)
CO2: 25 mmol/L (ref 22–32)
CREATININE: 0.87 mg/dL (ref 0.61–1.24)
Calcium: 8.5 mg/dL — ABNORMAL LOW (ref 8.9–10.3)
GFR calc non Af Amer: >60 mL/min (ref >60)
Glucose, Bld: 111 mg/dL — ABNORMAL HIGH (ref 65–99)
POTASSIUM: 3.1 mmol/L — AB (ref 3.5–5.1)
Phosphorus: 2.4 mg/dL — ABNORMAL LOW (ref 2.5–4.6)
Sodium: 141 mmol/L (ref 135–145)

## 2016-01-18 LAB — CBC WITH DIFFERENTIAL/PLATELET
Basophils Absolute: 0 10*3/uL (ref 0.0–0.1)
Basophils Relative: 0 %
EOS ABS: 0.1 10*3/uL (ref 0.0–0.7)
EOS PCT: 2 %
HCT: 33.3 % — ABNORMAL LOW (ref 39.0–52.0)
HEMOGLOBIN: 11.5 g/dL — AB (ref 13.0–17.0)
LYMPHS ABS: 1.5 10*3/uL (ref 0.7–4.0)
LYMPHS PCT: 20 %
MCH: 34.7 pg — AB (ref 26.0–34.0)
MCHC: 34.5 g/dL (ref 30.0–36.0)
MCV: 100.6 fL — AB (ref 78.0–100.0)
MONOS PCT: 10 %
Monocytes Absolute: 0.8 10*3/uL (ref 0.1–1.0)
Neutro Abs: 5.2 10*3/uL (ref 1.7–7.7)
Neutrophils Relative %: 68 %
PLATELETS: 232 10*3/uL (ref 150–400)
RBC: 3.31 MIL/uL — ABNORMAL LOW (ref 4.22–5.81)
RDW: 13.2 % (ref 11.5–15.5)
WBC: 7.6 10*3/uL (ref 4.0–10.5)

## 2016-01-18 LAB — MAGNESIUM: MAGNESIUM: 1.8 mg/dL (ref 1.7–2.4)

## 2016-01-18 MED ORDER — CARVEDILOL 6.25 MG PO TABS
6.2500 mg | ORAL_TABLET | Freq: Two times a day (BID) | ORAL | Status: DC
  Administered 2016-01-18: 6.25 mg via ORAL
  Filled 2016-01-18 (×3): qty 1

## 2016-01-18 MED ORDER — ACETAMINOPHEN 325 MG PO TABS
650.0000 mg | ORAL_TABLET | ORAL | Status: DC | PRN
  Administered 2016-01-18: 650 mg via ORAL
  Filled 2016-01-18: qty 2

## 2016-01-18 MED ORDER — ENSURE ENLIVE PO LIQD: 237.0000 mL | Freq: Two times a day (BID) | ORAL | Status: DC

## 2016-01-18 MED ORDER — CYANOCOBALAMIN 1000 MCG/ML IJ SOLN
1000.0000 ug | Freq: Every day | INTRAMUSCULAR | Status: DC
  Administered 2016-01-18 – 2016-01-19 (×2): 1000 ug via SUBCUTANEOUS
  Filled 2016-01-18 (×2): qty 1

## 2016-01-18 MED ORDER — LORAZEPAM 1 MG PO TABS
1.0000 mg | ORAL_TABLET | Freq: Once | ORAL | Status: AC
  Administered 2016-01-18: 1 mg via ORAL
  Filled 2016-01-18: qty 1

## 2016-01-18 MED ORDER — CEFUROXIME AXETIL 500 MG PO TABS
500.0000 mg | ORAL_TABLET | Freq: Two times a day (BID) | ORAL | Status: DC
  Administered 2016-01-18: 500 mg via ORAL
  Filled 2016-01-18: qty 1

## 2016-01-18 MED ORDER — K PHOS MONO-SOD PHOS DI & MONO 155-852-130 MG PO TABS
500.0000 mg | ORAL_TABLET | Freq: Two times a day (BID) | ORAL | Status: DC
  Administered 2016-01-18: 500 mg via ORAL
  Filled 2016-01-18 (×4): qty 2

## 2016-01-18 MED ORDER — MAGNESIUM SULFATE 2 GM/50ML IV SOLN
2.0000 g | Freq: Once | INTRAVENOUS | Status: AC
  Administered 2016-01-18: 2 g via INTRAVENOUS
  Filled 2016-01-18: qty 50

## 2016-01-18 MED ORDER — POTASSIUM CHLORIDE CRYS ER 20 MEQ PO TBCR
40.0000 meq | EXTENDED_RELEASE_TABLET | Freq: Two times a day (BID) | ORAL | Status: AC
  Administered 2016-01-18 – 2016-01-19 (×3): 40 meq via ORAL
  Filled 2016-01-18 (×3): qty 2

## 2016-01-18 MED ORDER — ENOXAPARIN SODIUM 40 MG/0.4ML ~~LOC~~ SOLN
40.0000 mg | SUBCUTANEOUS | Status: DC
  Administered 2016-01-18 – 2016-01-19 (×2): 40 mg via SUBCUTANEOUS
  Filled 2016-01-18 (×2): qty 0.4

## 2016-01-18 NOTE — Evaluation (Signed)
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 Troponin 4.89 in ED.    Clinical Impression   Pt currently min assist level for transfers and LB selfcare sit to stand, without UE support.  Decreased memory as well as limited safety awareness as he attempted to get up from bedside chair without waiting for therapist to be beside of him.  Feel he will benefit from acute care OT to help increase balance and independence with basic selfcare tasks.      Follow Up Recommendations  Home health OT;Supervision/Assistance - 24 hour    Equipment Recommendations  None recommended by OT       Precautions / Restrictions Precautions Precautions: Fall Restrictions Weight Bearing Restrictions: No      Mobility Bed Mobility Overal bed mobility: Modified Independent                Transfers Overall transfer level: Needs assistance Equipment used: 1 person hand held assist Transfers: Sit to/from Stand Sit to Stand: Mod assist         General transfer comment: Pt ambulated in the hallway without an assistive device and overall mod assist.     Balance     Sitting balance-Leahy Scale: Good       Standing balance-Leahy Scale: Fair Standing balance comment: LOB with mobility on multiple occasions, without UE support.                            ADL Overall ADL's : Needs assistance/impaired Eating/Feeding: Independent;Sitting   Grooming: Wash/dry hands;Standing;Minimal assistance   Upper Body Bathing: Supervision/ safety;Sitting   Lower Body Bathing: Minimal assistance;Sit to/from stand   Upper Body Dressing :  Sitting;Minimal assistance Upper Body Dressing Details (indicate cue type and reason): Min assist to tie hospital gown worn like a robe. Lower Body Dressing: Minimal assistance   Toilet Transfer: Moderate assistance   Toileting- Clothing Manipulation and Hygiene: Minimal assistance       Functional mobility during ADLs: Moderate assistance General ADL Comments: Pt with frequent LOB to the right and left with mobility in the hall and around the room without assistive device.  When asked about why he was in the hospital, he began to discuss events over the past year, but did not state reason he was her for this hospitalization.      Vision Vision Assessment?: No apparent visual deficits   Perception Perception Perception Tested?: No   Praxis Praxis Praxis tested?: Within functional limits    Pertinent Vitals/Pain Pain Assessment: No/denies pain     Hand Dominance Right   Extremity/Trunk Assessment Upper Extremity Assessment Upper Extremity Assessment: Overall WFL for tasks assessed       Cervical / Trunk Assessment Cervical / Trunk Assessment: Normal   Communication Communication Communication: HOH   Cognition Arousal/Alertness: Awake/alert Behavior During Therapy: WFL for tasks assessed/performed Overall Cognitive Status: No family/caregiver present to determine baseline cognitive functioning (Pt with decreased short term recall, could not state reason for hospitalization ) Area of Impairment: Orientation;Safety/judgement (Pt attempting to get up out of bedside recliner wihout anyone assisting him ) Orientation Level: Situation  Home Living Family/patient expects to be discharged to:: Private residence Living Arrangements: Spouse/significant other Available Help at Discharge: Family;Available PRN/intermittently;Available 24 hours/day (Hopefully significant other can give up to 24/7 intially) Type of Home: Other(Comment)  (condo) Home Access: Stairs to enter Entrance Stairs-Number of Steps: 3 Entrance Stairs-Rails: Right;Left Home Layout: Two level;Able to live on main level with bedroom/bathroom Alternate Level Stairs-Number of Steps: 13 Alternate Level Stairs-Rails: Left Bathroom Shower/Tub: Occupational psychologist: Standard     Home Equipment: Environmental consultant - 2 wheels;Shower seat - built in;Bedside commode;Grab bars - tub/shower          Prior Functioning/Environment Level of Independence: Independent             OT Diagnosis: Generalized weakness;Cognitive deficits   OT Problem List: Decreased strength;Impaired balance (sitting and/or standing);Decreased safety awareness;Decreased cognition;Decreased knowledge of use of DME or AE   OT Treatment/Interventions: Self-care/ADL training;Patient/family education;Neuromuscular education;Balance training;Therapeutic activities;DME and/or AE instruction;Cognitive remediation/compensation    OT Goals(Current goals can be found in the care plan section) Acute Rehab OT Goals Patient Stated Goal: Pt would like to get his balance back where he can walk without the use of an assistive device.  OT Goal Formulation: With patient Time For Goal Achievement: 02/01/16 Potential to Achieve Goals: Good  OT Frequency: Min 2X/week              End of Session Equipment Utilized During Treatment: Gait belt Nurse Communication: Mobility status  Activity Tolerance: Patient tolerated treatment well Patient left: in bed;with call bell/phone within reach;with nursing/sitter in room   Time: 1340-1411 OT Time Calculation (min): 31 min Charges:  OT General Charges $OT Visit: 1 Procedure OT Evaluation $OT Eval Moderate Complexity: 1 Procedure OT Treatments $Self Care/Home Management : 8-22 mins Joshua Henson OTR/L 01/18/2016, 2:23 PM

## 2016-01-18 NOTE — Progress Notes (Signed)
Patient states he is feeling anxious and states that his wife was supposed to come back to the hospital at this time of night to meet up with the doctor. Nurse called significant other to notify her of the above and she was able to speak with the patient to give him peace of mind and know that she is not coming until tomorrow. Notified on-call hospitalist of patient having some anxiety. Awaiting return page and or orders. Will continue to monitor patient to end of shift.

## 2016-01-18 NOTE — Progress Notes (Signed)
Speech Language Pathology Treatment: Dysphagia  Patient Details Name: Joshua Henson MRN: [id number] DOB: [birth year] Today's Date: 01/18/2016 Time: [id number] SLP Time Calculation (min) (ACUTE ONLY): 13 min  Assessment / Plan / Recommendation Clinical Impression  Pt demonstrates adequate arousal and attention to task with PO trials. Vocal quality is much improved since initial eval when immediate signs of aspiration were observed with sips of thin liquids. Today pt consumed 3 oz of water consecutively via straw x2 with timely swallow, dry vocal quality and no immediate cough. Pt did have a soft cough 1-2 minutes after intake, suspect unrelated. Given sensation of aspiration was consistent on eval, would not expect pt to develop silent aspiration 5 days post extubation. Will upgrade diet and monitor for tolerance.    HPI HPI: Joshua Henson is a 70 y.o. male with ETOH use and polysubstance abuse (UDS positive for cocaine, opiates, benzo's). He presented to [facility] ED 01/22 with increase in quantity of mechanical falls as well as chest pain. He drinks roughly 1 pint of liquor per day In ED, he complained of back and knee pain due to his falls as well as left sided chest pain that was sharp in nature. Pain was exacerbated by deep inspiration and positional changes and was relieved by staying still or using pain meds. Girlfriend also reported confusion as well as memory loss over the previous 2 days. He has had admissions in the past related to ETOH withdrawal as well as ETOH withdrawal seizures. He was admitted for ETOH intoxication as well as NSTEMI thought to be due to cocaine induced ischemia vs NSTEMI. Intubated 1/23-1/25.       SLP Plan  Continue with current plan of care     Recommendations  Diet recommendations: Regular;Thin liquid Liquids provided via: Cup;Straw Medication Administration: Whole meds with puree Supervision: Patient able to self feed;Intermittent supervision to cue for  compensatory strategies Compensations: Slow rate;Small sips/bites Postural Changes and/or Swallow Maneuvers: Seated upright 90 degrees             Oral Care Recommendations: Oral care BID Follow up Recommendations: 24 hour supervision/assistance Plan: Continue with current plan of care     Hatch Greenleigh Kauth, MA CCC-SLP [id number]  Lynann Beaver 01/18/2016, 9:49 AM

## 2016-01-18 NOTE — Evaluation (Signed)
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 Troponin 4.89 in ED.   Clinical Impression  Patient seen for mobility progression today. Patient mobilizing with use of RW for stability. No physical assist required but patient did require cues for improved gait and function. Patient pleasant and receptive. Still remains confused and poor historian at times throughout session but is alert and oriented x4. Will continue to see and progress as tolerated.    Follow Up Recommendations Home health PT;Supervision/Assistance - 24 hour    Equipment Recommendations  None recommended by PT    Recommendations for Other Services       Precautions / Restrictions Precautions Precautions: Fall Restrictions Weight Bearing Restrictions: No      Mobility  Bed Mobility Overal bed mobility: Modified Independent             General bed mobility comments: increased time to perform, no physical assist required  Transfers Overall transfer level: Needs assistance Equipment used: Rolling walker (2 wheeled) Transfers: Sit to/from Stand Sit to Stand: Min guard         General transfer comment: pt declined doing anything more than standing and this with much encouragement  Ambulation/Gait Ambulation/Gait assistance: Supervision Ambulation Distance (Feet): 210 Feet Assistive device: Rolling walker (2 wheeled) Gait Pattern/deviations: Step-through pattern;Decreased stride length;Drifts right/left Gait velocity: decreased Gait velocity interpretation: Below normal speed for age/gender General Gait Details: modest instability noted with ambulation,no  physical assist requried. Cued for increased cadence to aid in stability  Stairs            Wheelchair Mobility    Modified Rankin (Stroke Patients Only)       Balance Overall balance assessment: Needs assistance   Sitting balance-Leahy Scale: Good     Standing balance support: Bilateral upper extremity supported Standing balance-Leahy Scale: Fair Standing balance comment: use of RW for static standing                             Pertinent Vitals/Pain Pain Assessment: Faces Faces Pain Scale: Hurts little more Pain Location: low back Pain Intervention(s): Monitored during session    Home Living                        Prior Function                 Hand Dominance        Extremity/Trunk Assessment                         Communication      Cognition Arousal/Alertness: Awake/alert Behavior During Therapy: WFL for tasks assessed/performed Overall Cognitive Status: No family/caregiver present to determine baseline cognitive functioning (some confusion noted, decreased memory, poor historian)                      General Comments      Exercises  Patient performed LAQs at EOB 5x bilateral, performed hip flexion 5x bilateral and ankle ROM continuous for 30 seconds bilaterally.      Assessment/Plan    PT Assessment    PT  Diagnosis     PT Problem List    PT Treatment Interventions     PT Goals (Current goals can be found in the Care Plan section) Acute Rehab PT Goals Patient Stated Goal: none PT Goal Formulation: With patient Time For Goal Achievement: 01/29/16 Potential to Achieve Goals: Good    Frequency Min 3X/week   Barriers to discharge        Co-evaluation               End of Session Equipment Utilized During Treatment: Gait belt Activity Tolerance: Patient tolerated treatment well Patient left: in chair;with call bell/phone within reach;with chair alarm set Nurse Communication:  Mobility status         Time: [id number] PT Time Calculation (min) (ACUTE ONLY): 21 min   Charges:     PT Treatments $Gait Training: 8-22 mins   PT G CodesDuncan Henson 02-05-2016, 9:08 AM Joshua Henson, PT DPT  [phone]

## 2016-01-18 NOTE — Progress Notes (Signed)
Nutrition Follow-up   INTERVENTION:  Provide Ensure Enlive po BID, each supplement provides 350 kcal and 20 grams of protein Provide Multivitamin with minerals daily   NUTRITION DIAGNOSIS:   Inadequate oral intake related to poor appetite as evidenced by meal completion < 50%.  Ongoing; diet advanced, but PO intake remains inadequate  GOAL:   Patient will meet greater than or equal to 90% of their needs  Unmet  MONITOR:   PO intake, Supplement acceptance, Weight trends, I & O's, Labs  REASON FOR ASSESSMENT:   Ventilator    ASSESSMENT:   70 y.o. male with ETOH use and polysubstance abuse (UDS positive for cocaine, opiates, benzo's). He presented to [facility] ED 01/22 with increase in quantity of mechanical falls as well as chest pain. He drinks roughly 1 pint of liquor per day. He was admitted for ETOH intoxication as well as NSTEMI thought to be due to cocaine induced ischemia vs NSTEMI.  Pt was extubated on 1/25 and diet was advanced on 1/26. Per nursing notes, pt is eating 25% of most meals. Pt reports having a poor appetite. He expresses desire to eat better and exercise more. RD emphasized the importance of eating well while recovering. Pt agreeable to drinking Ensure while admitted, but hopes to be discharged tomorrow.   Labs: low potassium, low calcium  Diet Order:  Diet regular Room service appropriate?: Yes; Fluid consistency:: Thin  Skin:  Reviewed, no issues  Last BM:  1/26  Height:   Ht Readings from Last 1 Encounters:  01/15/16 5\' 10"  (1.778 m)    Weight:   Wt Readings from Last 1 Encounters:  01/18/16 201 lb 8 oz (91.4 kg)    Ideal Body Weight:  75.5 kg  BMI:  Body mass index is 28.91 kg/(m^2).  Estimated Nutritional Needs:   Kcal:  2100-2300  Protein:  100-110 grams  Fluid:  2.1-2.3 L/day  EDUCATION NEEDS:   No education needs identified at this time  [place], LDN Inpatient Clinical Dietitian Pager: [phone] After Hours  Pager: [phone]

## 2016-01-18 NOTE — Progress Notes (Signed)
Notified by sitter of patient having a temperature of 100.2. Notified MD for no parameters and also has no tylenol ordered. Awaiting return page and or orders. Will continue to monitor patient to end of shift.

## 2016-01-18 NOTE — Progress Notes (Signed)
Anti-anxiety medication given per order for one time dose for patient stating he felt anxious. Will continue to monitor patient to end of shift.

## 2016-01-18 NOTE — Care Management Important Message (Signed)
Important Message  Patient Details  Name: Joshua Henson MRN: [id number] Date of Birth: June 03, 1946   Medicare Important Message Given:  Yes    Ladavion Savitz P Shiann Kam 01/18/2016, 2:48 PM

## 2016-01-18 NOTE — Progress Notes (Signed)
[facility] TEAM 1 - Stepdown/ICU TEAM PROGRESS NOTE  Joshua Henson [id number] DOB: 06/29/1946 DOA: 01/10/2016 PCP: Adella Hare, MD  Admit HPI / Brief Narrative: 70 year old M Hx Alcohol and Cocaine abuse, HTN, CVA, Prostate Cancer S/P XRT,Hearing loss 2/2 Mnire's disease, and CKD who presented with a history of increasing falls and chest pain. Patient in the preceding weeks had been drinking more. Patient reported left-sided chest pain and bruises of his knees. His girlfriend stated he had been more confused.   Upon arrival to the emergency department a CT head showed no acute abnormalities. Patient was found to have an Alcohol level 210, elevated troponin of 4.59, but normal EKG.   Significant Events: 1/22 > admitted with ETOH withdrawal. 1/22 CT Head - No acute intracranial process. 1/23 PCCM consulted for transfer to ICU for initiation of precedex. Required intubation for respiratory insufficiency. 1/24 TTE - LV normal in size with mild LVH. EF 55%. Inadequate to assess wall motion. Grade 1 diastolic dysfunction. RV normal in size and function. 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  He appears to still be confused.  His RN and bedside sitter confirm that he has been confused today.  He tells me he has not been out of bed for days, but in fact has just worked w/ PT.  He is not able to tell me where he lives without pausing for a lengthy time period.  He denies cp, sob, n/v, or abdom pain.  He does admit to feeling very weak.    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 Secondary to patient's substance abuse placing stent was not appropriate. Recommend medical management -1/29 cardiology to arrange for follow-up   Acute Hypoxic Respiratory Failure Requiring Intubation - Likely secondary to aspiration. -Extubated 1/25 - Breathing well on room air -SLP evaluated post-extubation -> recommend Dysphagia 3 diet nectar thick liquid - has now been advanced to a regular diet w/ thin liquids   Aspiration Pneumonia  -Tracheal aspirate positiveMethicillin Sensitive Staph aureus -Completed Unasyn4 days and then was switched to Cefazolin to complete 7 days of tx - no persisting WBC elevation or fever at time of d/c - will stop at 7 days given hx of refractory C diff, and no current sx to suggest persisting pulmonary infection    Acute Encephalopathy  -Most likely secondary to Polysubstance abuse - UDS positive for cocaine, opiates, benzo's. -appears to be improving, but not yet back to baseline - as per my interview, he is clearly not safe to be alone at this time - with his permission I have spoken to his live in girlfriend - she confirms to me that he is still confused, and she is very concerned that he is not stable enough for d/c - she informs me that he will NOT have 24hr supervision at home -Social work provided patient resources for outpatient drug/alcohol abuse rehabilitation prior to discharge.  -Continue thiamine / folate / multivitamin -cont to work w/ PT/OT today - increase mobility -d/c tele -supplement borderline B12 -delay d/c until mental status improves as pt not currently felt  to be safe for d/c home   Depression -Continue citalopram   Tobacco use -Nicotine Patch utilized while in hospital   AKI -Resolved   Hypokalemia -continue to supplement - findings suggestive of significant total body deficit - c  Hypomagnesemia  repleted to normal   Hypophosphatemia -should improve w/  ongoing normal diet   Code Status: FULL Family Communication: no family present at time of exam Disposition Plan: med surg - PT/OT - follow mental status (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 rate 20, height 5\' 10"  (1.778 m), weight 91.4 kg (201 lb 8 oz), SpO2 93 %.  Intake/Output Summary (Last 24 hours) at 01/18/16 0909 Last data filed at 01/18/16 0847  Gross per 24 hour  Intake    480 ml  Output    200 ml  Net    280 ml   Exam: General: No acute respiratory distress - alert but confused  Lungs: Clear to auscultation bilaterally without wheezes or crackles Cardiovascular: Regular rate and rhythm without murmur gallop or rub normal S1 and S2 Abdomen: Nontender, nondistended, soft, bowel sounds positive, no rebound, no ascites, no appreciable mass Extremities: No significant cyanosis, clubbing, or edema bilateral lower extremities  Data Reviewed:  Basic Metabolic Panel:  Recent Labs Lab 01/14/16 0332 01/15/16 0236 01/16/16 0327 01/17/16 0355 01/18/16 0517  NA 141 140 139 138 141  K 3.4* 3.6 2.9* 2.8* 3.1*  CL 105 102 102 102 106  CO2 25 28 23 25 25   GLUCOSE 97 108* 116* 126* 111*  BUN 22* 13 10 8 10   CREATININE 1.16 1.03 0.92 0.85 0.87  CALCIUM 8.5* 8.6* 8.6* 8.2* 8.5*  MG 2.1 1.6* 1.7 1.9 1.8  PHOS 2.1* 1.8* 2.8 3.1 2.4*    CBC:  Recent Labs Lab 01/14/16 0332 01/15/16 0236 01/16/16 0327 01/17/16 0355 01/18/16 0517  WBC 10.7* 12.1* 7.7 6.0 7.6  NEUTROABS 8.7* 8.8* 5.4 4.0 5.2  HGB 12.1* 12.9* 12.0* 11.6* 11.5*  HCT 34.4* 37.0* 34.1* 32.7* 33.3*  MCV 105.8* 102.5* 100.9* 101.9* 100.6*  PLT 199 229 218 205 232    Liver Function Tests:  Recent  Labs Lab 01/14/16 0332 01/15/16 0236 01/16/16 0327 01/17/16 0355 01/18/16 0517  AST  --   --  82*  --   --   ALT  --   --  44  --   --   ALKPHOS  --   --  85  --   --   BILITOT  --   --  0.8  --   --   PROT  --   --  5.8*  --   --   ALBUMIN 2.2* 2.2* 2.2* 2.0* 2.0*   Coags:  Recent Labs Lab 01/11/16 1200  INR 1.33   CBG:  Recent Labs Lab 01/11/16 1329  GLUCAP 137*    Recent Results (from the past 240 hour(s))  MRSA PCR Screening     Status: None   Collection Time: 01/11/16  1:25 PM  Result Value Ref Range Status   MRSA by PCR NEGATIVE NEGATIVE Final    Comment:        The GeneXpert MRSA Assay (FDA approved for NASAL specimens only), is one component  of a comprehensive MRSA colonization surveillance program. It is not intended to diagnose MRSA infection nor to guide or monitor treatment for MRSA infections.   Culture, respiratory (NON-Expectorated)     Status: None   Collection Time: 01/11/16 10:50 PM  Result Value Ref Range Status   Specimen Description TRACHEAL ASPIRATE  Final   Special Requests NONE  Final   Gram Stain   Final    ABUNDANT WBC PRESENT, PREDOMINANTLY PMN NO SQUAMOUS EPITHELIAL CELLS SEEN ABUNDANT GRAM POSITIVE COCCI IN CLUSTERS Performed at [facility]    Culture   Final    ABUNDANT STAPHYLOCOCCUS AUREUS Note: RIFAMPIN AND GENTAMICIN SHOULD NOT BE USED AS SINGLE DRUGS FOR TREATMENT OF STAPH INFECTIONS. This organism is presumed to be Clindamycin resistant based on detection of inducible Clindamycin resistance. Performed at [facility]    Report Status 01/14/2016 FINAL  Final   Organism ID, Bacteria STAPHYLOCOCCUS AUREUS  Final      Susceptibility   Staphylococcus aureus - MIC*    CLINDAMYCIN RESISTANT      ERYTHROMYCIN >=8 RESISTANT Resistant     GENTAMICIN <=0.5 SENSITIVE Sensitive     LEVOFLOXACIN 0.25 SENSITIVE Sensitive     OXACILLIN 0.5 SENSITIVE Sensitive     RIFAMPIN <=0.5 SENSITIVE Sensitive      TRIMETH/SULFA <=10 SENSITIVE Sensitive     VANCOMYCIN 1 SENSITIVE Sensitive     TETRACYCLINE <=1 SENSITIVE Sensitive     MOXIFLOXACIN <=0.25 SENSITIVE Sensitive     * ABUNDANT STAPHYLOCOCCUS AUREUS     Studies:   Recent x-ray studies have been reviewed in detail by the Attending Physician  Scheduled Meds:  Scheduled Meds: . aspirin  81 mg Oral Daily  . atorvastatin  40 mg Oral q1800  . carvedilol  6.25 mg Per Tube BID WC  . citalopram  20 mg Oral QHS  . folic acid  1 mg Oral Daily  . lisinopril  10 mg Oral Daily  . multivitamin with minerals  1 tablet Oral Daily  . nicotine  21 mg Transdermal Daily  . pneumococcal 23 valent vaccine  0.5 mL Intramuscular Tomorrow-1000  . sodium chloride flush  3 mL Intravenous Q12H  . tamsulosin  0.4 mg Oral QPC supper  . thiamine  100 mg Oral Daily    Time spent on care of this patient: 35 mins   MCCLUNG,JEFFREY T , MD   [facility] Office  [phone] Pager - Text Page per Shea Evans as per below:  On-Call/Text Page:      [email or web address]      password [email or web address]  If 7PM-7AM, please contact night-coverage [email or web address] Password [email or web address] 01/18/2016, 9:09 AM   LOS: 7 days

## 2016-01-19 DIAGNOSIS — W19XXXA Unspecified fall, initial encounter: Secondary | ICD-10-CM | POA: Diagnosis present

## 2016-01-19 DIAGNOSIS — E876 Hypokalemia: Secondary | ICD-10-CM

## 2016-01-19 DIAGNOSIS — Z720 Tobacco use: Secondary | ICD-10-CM | POA: Diagnosis present

## 2016-01-19 LAB — COMPREHENSIVE METABOLIC PANEL
ALK PHOS: 80 U/L (ref 38–126)
ALT: 30 U/L (ref 17–63)
AST: 55 U/L — AB (ref 15–41)
Albumin: 2.1 g/dL — ABNORMAL LOW (ref 3.5–5.0)
Anion gap: 10 (ref 5–15)
BUN: 10 mg/dL (ref 6–20)
CALCIUM: 8.6 mg/dL — AB (ref 8.9–10.3)
CHLORIDE: 107 mmol/L (ref 101–111)
CO2: 24 mmol/L (ref 22–32)
CREATININE: 0.8 mg/dL (ref 0.61–1.24)
GFR calc Af Amer: >60 mL/min (ref >60)
Glucose, Bld: 107 mg/dL — ABNORMAL HIGH (ref 65–99)
Potassium: 3.6 mmol/L (ref 3.5–5.1)
Sodium: 141 mmol/L (ref 135–145)
Total Bilirubin: 0.7 mg/dL (ref 0.3–1.2)
Total Protein: 5.3 g/dL — ABNORMAL LOW (ref 6.5–8.1)

## 2016-01-19 LAB — PHOSPHORUS: Phosphorus: 3.1 mg/dL (ref 2.5–4.6)

## 2016-01-19 LAB — MAGNESIUM: MAGNESIUM: 2 mg/dL (ref 1.7–2.4)

## 2016-01-19 MED ORDER — LISINOPRIL 10 MG PO TABS: 10.0000 mg | ORAL_TABLET | Freq: Every day | ORAL | Status: DC

## 2016-01-19 MED ORDER — CITALOPRAM HYDROBROMIDE 40 MG PO TABS: 40.0000 mg | ORAL_TABLET | Freq: Every day | ORAL | Status: DC

## 2016-01-19 MED ORDER — CITALOPRAM HYDROBROMIDE 20 MG PO TABS: 40.0000 mg | ORAL_TABLET | Freq: Every day | ORAL | Status: DC

## 2016-01-19 MED ORDER — CARVEDILOL 6.25 MG PO TABS
6.2500 mg | ORAL_TABLET | Freq: Two times a day (BID) | ORAL | Status: DC
Start: 2016-01-19 — End: 2016-04-05

## 2016-01-19 MED ORDER — NICOTINE 21 MG/24HR TD PT24: 21.0000 mg | MEDICATED_PATCH | Freq: Every day | TRANSDERMAL | Status: DC

## 2016-01-19 MED ORDER — ATORVASTATIN CALCIUM 40 MG PO TABS: 40.0000 mg | ORAL_TABLET | Freq: Every day | ORAL | Status: DC

## 2016-01-19 MED ORDER — K PHOS MONO-SOD PHOS DI & MONO 155-852-130 MG PO TABS: 500.0000 mg | ORAL_TABLET | Freq: Two times a day (BID) | ORAL | Status: DC

## 2016-01-19 NOTE — Progress Notes (Signed)
Speech Language Pathology Treatment: Dysphagia  Patient Details Name: Joshua Henson MRN: [id number] DOB: Sep 21, 1946 Today's Date: 01/19/2016 Time: [id number] SLP Time Calculation (min) (ACUTE ONLY): 16 min  Assessment / Plan / Recommendation Clinical Impression  Pt demonstrates excellent tolerance of thin liquids when administered 3 oz water test x3. No signs of aspiration. Pt may continue regular diet and thin liquids with basic aspiration precautions. Pt verbalized intellectual awareness of basic safety precautions, but cannot follow precautions during ADLs without full supervision and min verbal cues. Needs verbal cues to problem solve basic tasks. SLP will sign off for swallowing at this time.    HPI HPI: Joshua Henson is a 70 y.o. male with ETOH use and polysubstance abuse (UDS positive for cocaine, opiates, benzo's). He presented to [facility] ED 01/22 with increase in quantity of mechanical falls as well as chest pain. He drinks roughly 1 pint of liquor per day In ED, he complained of back and knee pain due to his falls as well as left sided chest pain that was sharp in nature. Pain was exacerbated by deep inspiration and positional changes and was relieved by staying still or using pain meds. Girlfriend also reported confusion as well as memory loss over the previous 2 days. He has had admissions in the past related to ETOH withdrawal as well as ETOH withdrawal seizures. He was admitted for ETOH intoxication as well as NSTEMI thought to be due to cocaine induced ischemia vs NSTEMI. Intubated 1/23-1/25.       SLP Plan  Continue with current plan of care     Recommendations  Diet recommendations: Regular;Thin liquid Liquids provided via: Cup;Straw Medication Administration: Whole meds with puree Supervision: Patient able to self feed;Intermittent supervision to cue for compensatory strategies Compensations: Slow rate;Small sips/bites Postural Changes and/or Swallow Maneuvers: Seated  upright 90 degrees             Plan: Continue with current plan of care     GO               [facility], [place] CCC-SLP [id number]  Joshua Henson 01/19/2016, 1:06 PM

## 2016-01-19 NOTE — Progress Notes (Signed)
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 Troponin 4.89 in ED.     PT Comments    Came into patient room as patient was in shower without physical assist. Patient with poor safety awareness and insight. Patient with some instability in shower. Assisted patient out of shower to chair, cued patient for functional tasks of hygiene and ADLs. Patient very impulsive and demonstrates poor safety awareness. patient also highly confabulatory. Stating he has been bed ridded for 8 months, then stating he has not walked in several weeks despite just walking with PT yesterday and walking from bathroom today. Overall his mobility is improved with use of assistive device, but cognition remains alarming at this time. Continue to feel patient needs 24/7 supervision for safety.   Follow Up Recommendations  Home health PT;Supervision/Assistance - 24 hour;Other (comment)     Equipment Recommendations  None recommended by PT    Recommendations for Other Services       Precautions / Restrictions Precautions Precautions: Fall Restrictions Weight Bearing Restrictions: No    Mobility  Bed Mobility               General bed mobility comments: received in shower  Transfers Overall transfer level: Needs assistance Equipment used: Rolling walker (2 wheeled) Transfers: Sit to/from Stand Sit to Stand: Min guard         General transfer comment: pt declined doing anything more than standing and this with much encouragement  Ambulation/Gait Ambulation/Gait assistance: Supervision (Min assist without RW 10') Ambulation Distance (Feet): 200  Feet Assistive device: Rolling walker (2 wheeled) Gait Pattern/deviations: Step-through pattern;Decreased stride length;Drifts right/left Gait velocity: decreased   General Gait Details: improved stability with use of RW, very unsteady without RW   Stairs            Wheelchair Mobility    Modified Rankin (Stroke Patients Only)       Balance Overall balance assessment: Needs assistance Sitting-balance support: No upper extremity supported Sitting balance-Leahy Scale: Good     Standing balance support: Bilateral upper extremity supported Standing balance-Leahy Scale: Fair Standing balance comment: poor satic standing without UE support. Fair with use of RW                     Cognition Arousal/Alertness: Awake/alert Behavior During Therapy: WFL for tasks assessed/performed Overall Cognitive Status: No family/caregiver present to determine baseline cognitive functioning (Pt with decreased short term recall, could not state reason for hospitalization ) Area of Impairment: Orientation;Following commands;Safety/judgement;Awareness;Problem solving Orientation Level: Situation     Following Commands: Follows one step commands with increased time Safety/Judgement: Decreased awareness of safety;Decreased awareness of deficits Awareness: Emergent Problem Solving: Slow processing;Decreased initiation;Difficulty sequencing;Requires verbal cues;Requires tactile cues General Comments: Patient highly confabulatory throughout session    Exercises      General Comments General comments (skin integrity, edema, etc.): patient recevied coming from shower. patient with some instability in shower. Assisted patient out of shower to chair, cued patient for functional tasks of hygiene and ADLs. Patient very impulsive and demonstrates poor safety awareness. patient also highly confabulatory. Stating he has been bed ridded for 8 months, then stating he has not walked in several weeks  despite just walking with PT yesterday and walking from bathroom today.       Pertinent Vitals/Pain Pain Assessment: No/denies pain    Home Living                      Prior Function            PT Goals (current goals can now be found in the care plan section) Acute Rehab PT Goals PT Goal Formulation: With patient Time For Goal Achievement: 01/29/16 Potential to Achieve Goals: Good Progress towards PT goals: Progressing toward goals    Frequency  Min 3X/week    PT Plan Current plan remains appropriate    Co-evaluation             End of Session Equipment Utilized During Treatment: Gait belt Activity Tolerance: Patient tolerated treatment well Patient left: in chair;with call bell/phone within reach;with chair alarm set;with nursing/sitter in room     Time: 0840-0905 PT Time Calculation (min) (ACUTE ONLY): 25 min  Charges:  $Gait Training: 8-22 mins $Therapeutic Activity: 8-22 mins                    G CodesDuncan Dull January 31, 2016, 9:57 AM Alben Deeds, PT DPT  [phone]

## 2016-01-19 NOTE — Discharge Summary (Signed)
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 Secondary to patient's substance abuse per cardiology placing stent was not appropriate. Recommend medical management -Scheduled follow-up appointment with Dr.[name] D [name] [facility] for 3-4 weeks postdischarge NSTEM  Acute Hypoxic Respiratory Failure Requiring Intubation - Likely secondary to aspiration. -Resolved   Aspiration Pneumonia  -Tracheal aspirate positive Methicillin Sensitive Staph aureus -Completed Unasyn 4 days and then was switched to Cefazolin. Will switch to Ceftin to complete 7 days of antibiotics.  -NOTE; Hx of C. Diff -Schedule follow-up with  Dr. Linda Hedges, [place] [facility] 1-2 weeks post discharge acute hypoxic respiratory failure, aspiration pneumonia, NSTEMI  Acute Encephalopathy - -Most likely secondary to Polysubstance abuse - UDS positive for cocaine, opiates, benzo's. -Resolved -Social work; provided patient resources for outpatient drug/alcohol abuse rehabilitation prior to discharge.  -Continue thiamine / folate / multivitamin. -Would not restart patient's Outpatient bupropion, chlordiazepoxide, norco. PCP decide if patient should start these meds again.  Depression/Anxiety -Continue citalopram 40 mg QHS -Ativan 1 mg BID PRN anxiety  Tobacco use -Nicotine Patch 21mg   AKI - Resolved   Hypokalemia -Potassium goal>4  Hypomagnesemia  -magnesium goal > 2   Hypophosphatemia -See hypokalemia    Discharge Diagnoses:  Active Problems:   Elevated troponin    Chest pain   CAD (coronary artery disease), native coronary artery   Alcohol abuse with intoxication (HCC)   Frequent falls   Left rib fracture   Acute encephalopathy   Polysubstance abuse   Delirium tremens ([place])   NSTEMI (non-ST elevated myocardial infarction) ([place])   Sepsis due to Enterococcus with acute renal failure and metabolic encephalopathy (HCC)   Acute respiratory failure with hypoxia (HCC)   CAD in native artery   Cocaine abuse   CAP (community acquired pneumonia) due to MSSA (methicillin sensitive Staphylococcus aureus) ([place])   Fall   Tobacco abuse   Hypokalemia   Hypomagnesemia   Hypophosphatemia   Discharge Condition: Stable  Diet recommendation: Heart healthy  Filed Weights   01/17/16 0441 01/18/16 0536 01/19/16 0527  Weight: 91.717 kg (202 lb 3.2 oz) 91.4 kg (201 lb 8 oz) 89.767 kg (197 lb 14.4 oz)    History of present illness:  70 year old WM PMHx Polysubstance Abuse (Alcohol Abuse Cocaine), HTN, CVA, Prostate Cancer S/P XRT,Hearing loss 2/2 Mnire's disease, CKD;   who presents with a history of increasing falls and chest pain. Patient is somewhat of a poor historian and his live-in girlfriend provides much of his history. Patient in the recent weeks has been drinking more. A couple days ago when she has come home from work she notes that he fell on the [place] and table and broke and it. She notes that he has a previous history of seizures. Patient had reported left-sided chest pain and he had bruises of his knees. She notes last few weeks that she's noticed a rapid loss of his muscle mass in his lower extremities. Patient notes that he has left-sided chest pain more so than the right. Pain is sharp in nature. Worse with deep inspiration or trying to sit up or changes positions. Relieved by staying still and pain medication. His girlfriend  also noticed that he's been more confused with memory loss in the last 2 days. He actually called his daughter today and  was talking so confused that she called the patient's girlfriend to see if everything was all right. Review of patient's record shows that he's had multiple previous admissions for alcoholism suffering previous alcohol withdrawal seizures.  Upon arrival to the emergency department patient is evaluated with a CT scan of the head which shows no acute abnormalities. Patient was found to have relatively normal blood work except for Alcohol level 210, elevated troponin of 4.59, but normal EKG. Cardiology was consulted and evaluated the patient and recommended inpatient admission trending cardiac enzymes and check CK level. During this admission patient was treated for NSTEMI most likely secondary to cocaine and alcohol consumption. Patient received cardiac catheterization which showed multiple areas of stenosis, however negative stents deployed secondary to patient's substance abuse (cardiology felt poor candidate). In addition patient's hospital course was complicated by MSSA pneumonia, and withdrawal symptoms. Patient currently stable and appropriate for discharge.   Consultants: Dr.[name] D [name]    Procedure/Significant Events: 01/22 > admitted with ETOH withdrawal. 1/22 CT Head w/o : No acute intracranial process. 01/23 > PCCM consulted for transfer to ICU for initiation of precedex. Required intubation for respiratory insufficiency. 1/23 PCXR: ETT 5cm above carina. Patchy airspace opacity bilateral bases. No effusion. 1/24 TTE : LV normal in size with mild LVH. EF 55%. Inadequate to assess wall motion. Grade 1 diastolic dysfunction. RV normal in size and function. 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 -Mild non-obstructive disease in the LAD and Circumflex  Culture 1/23 Tracheal Asp positive: MSSA 1/23 MRSA nares Negative 1/24 Urine Strep Antigen Negative 1/24 Urine Legionella Antigen  Negative   Antibiotics: Unasyn 1/23 >> 1/26 Cefazolin 1/26 >> 1/29 Ceftin 1/29>> 2/2    Discharge Exam: Filed Vitals:   01/18/16 1946 01/19/16 0527 01/19/16 0805 01/19/16 0956  BP: 154/87 151/80 141/74 150/93  Pulse: 74 65 55   Temp: 100.2 F (37.9 C) 98.6 F (37 C)    TempSrc:  Oral    Resp: 22 16    Height:      Weight:  89.767 kg (197 lb 14.4 oz)    SpO2: 92% 91%      General: A/O 4 cooperative, follows commands, NAD. No acute respiratory distress Eyes: Negative headache, scleral hemorrhage ENT: Negative Runny nose, negative gingival bleeding, Neck: Negative scars, masses, torticollis, lymphadenopathy, JVD Lungs: Clear to auscultation bilaterally without wheezes or crackles Cardiovascular: Regular rate and rhythm without murmur gallop or rub normal S1 and S2   Discharge Instructions     Medication List    STOP taking these medications        ALPRAZolam 0.5 MG tablet  Commonly known as:  XANAX     buPROPion 100 MG tablet  Commonly known as:  WELLBUTRIN     chlordiazePOXIDE 10 MG capsule  Commonly known as:  LIBRIUM     HYDROcodone-acetaminophen 5-325 MG tablet  Commonly known as:  NORCO/VICODIN     LORazepam 1 MG tablet  Commonly known as:  ATIVAN     potassium chloride SA 20 MEQ tablet  Commonly known as:  K-DUR,KLOR-CON     triamterene-hydrochlorothiazide 37.5-25 MG capsule  Commonly known as:  DYAZIDE      TAKE these medications        aspirin 81 MG EC tablet  Take 1 tablet (81  mg total) by mouth daily.     atorvastatin 40 MG tablet  Commonly known as:  LIPITOR  Take 1 tablet (40 mg total) by mouth daily at 6 PM.     carvedilol 6.25 MG tablet  Commonly known as:  COREG  Take 1 tablet (6.25 mg total) by mouth 2 (two) times daily with a meal.     cholecalciferol 1000 units tablet  Commonly known as:  VITAMIN D  Take 2,000 Units by mouth daily.     CIALIS 20 MG tablet  Generic drug:  tadalafil  Take 20 mg by mouth as needed for  erectile dysfunction.     citalopram 40 MG tablet  Commonly known as:  CELEXA  Take 1 tablet (40 mg total) by mouth at bedtime.     Co Q-10 200 MG Caps  Take 200 mg by mouth daily.     Fish Oil 1000 MG Caps  Take by mouth 2 (two) times daily.     folic acid 1 MG tablet  Commonly known as:  FOLVITE  Take 1 tablet (1 mg total) by mouth daily.     lisinopril 10 MG tablet  Commonly known as:  PRINIVIL,ZESTRIL  Take 1 tablet (10 mg total) by mouth daily.     Magnesium 500 MG Caps  Take 500 mg by mouth at bedtime.     nicotine 21 mg/24hr patch  Commonly known as:  NICODERM CQ - dosed in mg/24 hours  Place 1 patch (21 mg total) onto the skin daily.     phosphorus 155-852-130 MG tablet  Commonly known as:  K PHOS NEUTRAL  Take 2 tablets (500 mg total) by mouth 2 (two) times daily.     tamsulosin 0.4 MG Caps capsule  Commonly known as:  FLOMAX  Take 0.4 mg by mouth daily.     thiamine 100 MG tablet  Commonly known as:  VITAMIN B-1  Take 100 mg by mouth daily.       Allergies  Allergen Reactions  . Celebrex [Celecoxib] Rash   Follow-up Information    Schedule an appointment as soon as possible for a visit with Lauree Chandler, MD.   Specialty:  Cardiology   Why:  Scheduled follow-up appointment with Dr.[name] D [name] [facility] for 3-4 weeks postdischarge NSTEMI   Contact information:   [address]  [place] [address] [phone]       Follow up with Adella Hare, MD.   Specialty:  Internal Medicine   Why:  Schedule follow-up with  Dr. Adella Hare E 1-2 weeks post discharge acute hypoxic respiratory failure, aspiration pneumonia, NSTEMI       The results of significant diagnostics from this hospitalization (including imaging, microbiology, ancillary and laboratory) are listed below for reference.    Significant Diagnostic Studies: Dg Chest 2 View  01/10/2016  CLINICAL DATA:  Left lower rib pain.  Fell on Wednesday. EXAM: CHEST  2  VIEW COMPARISON:  04/02/2015 FINDINGS: The heart is enlarged but stable. There is tortuosity of the thoracic aorta. Patchy bibasilar atelectasis or infiltrates. No effusions or edema. Possible nondisplaced lower left rib fractures. No pneumothorax or obvious pleural effusion. IMPRESSION: Cardiac enlargement and bibasilar infiltrates or atelectasis. Possible lower left rib fractures. Electronically Signed   By: Marijo Sanes M.D.   On: 01/10/2016 18:33   Dg Ribs Unilateral W/chest Left  01/11/2016  CLINICAL DATA:  Fall several days ago with rib fracture. EXAM: LEFT RIBS AND CHEST - 3+ VIEW COMPARISON:  01/10/2016 chest x-ray FINDINGS: 1540 hours. Frontal chest radiograph shows focal airspace disease in the right mid lung and left base suspicious for pneumonia. Cardiopericardial silhouette is at upper limits of normal for size. Telemetry leads overlie the chest. Oblique films show no evidence for left lower rib fracture as suggested on the previous chest x-ray. IMPRESSION: Bilateral airspace disease, suspicious for multifocal pneumonia. Follow-up imaging is recommended to ensure resolution. Electronically Signed   By: Misty Stanley M.D.   On: 01/11/2016 15:59   Ct Head Wo Contrast  01/10/2016  CLINICAL DATA:  Multiple falls.  Alcohol abuse. EXAM: CT HEAD WITHOUT CONTRAST TECHNIQUE: Contiguous axial images were obtained from the base of the skull through the vertex without intravenous contrast. COMPARISON:  04/02/2015 FINDINGS: There is no evidence of intracranial hemorrhage, brain edema, or other signs of acute infarction. There is no evidence of intracranial mass lesion or mass effect. No abnormal extraaxial fluid collections are identified. Mild cerebral atrophy and chronic small vessel disease is stable in appearance. Ventricles are stable in size. No evidence of skull fracture. Chronic bilateral maxillary sinusitis again noted. IMPRESSION: No acute intracranial abnormality. Stable mild cerebral atrophy and  chronic small vessel disease. Chronic bilateral maxillary sinusitis. Electronically Signed   By: Earle Gell M.D.   On: 01/10/2016 18:23   Dg Chest Port 1 View  01/13/2016  CLINICAL DATA:  Intubation. EXAM: PORTABLE CHEST 1 VIEW COMPARISON:  01/11/2016. FINDINGS: Endotracheal tube and NG tube in stable position. Stable cardiomegaly. Persistent right upper lobe infiltrate without significant interim change. Low lung volumes with bibasilar atelectasis and/or infiltrates. No pleural effusion or pneumothorax. No acute osseous abnormality. IMPRESSION: 1. Lines and tubes in stable position. 2. Persistent right upper lobe infiltrate without significant change. Low lung volumes with bibasilar atelectasis and/or mild infiltrates again noted. 3. Stable cardiomegaly. Electronically Signed   By: Marcello Moores  Register   On: 01/13/2016 07:16   Dg Chest Port 1 View  01/11/2016  CLINICAL DATA:  Status post intubation today. EXAM: PORTABLE CHEST 1 VIEW COMPARISON:  Single view of the chest earlier today. FINDINGS: Endotracheal tube is in place the tip in good position 4.8 cm above the carina. Patchy airspace disease in the right chest and airspace opacity in the left lung base again seen. No pneumothorax or pleural effusion. Heart size is upper normal. IMPRESSION: ET tube in good position. No marked change in bilateral airspace disease, worse on the right. Electronically Signed   By: Inge Rise M.D.   On: 01/11/2016 22:56   Dg Abd Portable 1v  01/11/2016  CLINICAL DATA:  Status post NG tube placement.  Initial encounter. EXAM: PORTABLE ABDOMEN - 1 VIEW COMPARISON:  None. FINDINGS: NG tube is in place with the side-port in the stomach in good position. IMPRESSION: As above. Electronically Signed   By: Inge Rise M.D.   On: 01/11/2016 22:55    Microbiology: Recent Results (from the past 240 hour(s))  MRSA PCR Screening     Status: None   Collection Time: 01/11/16  1:25 PM  Result Value Ref Range Status   MRSA  by PCR NEGATIVE NEGATIVE Final    Comment:        The GeneXpert MRSA Assay (FDA approved for NASAL specimens only), is one component of a comprehensive MRSA colonization surveillance program. It is not intended to diagnose MRSA infection nor to guide or monitor treatment for MRSA infections.   Culture, respiratory (NON-Expectorated)     Status: None   Collection Time: 01/11/16  10:50 PM  Result Value Ref Range Status   Specimen Description TRACHEAL ASPIRATE  Final   Special Requests NONE  Final   Gram Stain   Final    ABUNDANT WBC PRESENT, PREDOMINANTLY PMN NO SQUAMOUS EPITHELIAL CELLS SEEN ABUNDANT GRAM POSITIVE COCCI IN CLUSTERS Performed at [facility]    Culture   Final    ABUNDANT STAPHYLOCOCCUS AUREUS Note: RIFAMPIN AND GENTAMICIN SHOULD NOT BE USED AS SINGLE DRUGS FOR TREATMENT OF STAPH INFECTIONS. This organism is presumed to be Clindamycin resistant based on detection of inducible Clindamycin resistance. Performed at [facility]    Report Status 01/14/2016 FINAL  Final   Organism ID, Bacteria STAPHYLOCOCCUS AUREUS  Final      Susceptibility   Staphylococcus aureus - MIC*    CLINDAMYCIN RESISTANT      ERYTHROMYCIN >=8 RESISTANT Resistant     GENTAMICIN <=0.5 SENSITIVE Sensitive     LEVOFLOXACIN 0.25 SENSITIVE Sensitive     OXACILLIN 0.5 SENSITIVE Sensitive     RIFAMPIN <=0.5 SENSITIVE Sensitive     TRIMETH/SULFA <=10 SENSITIVE Sensitive     VANCOMYCIN 1 SENSITIVE Sensitive     TETRACYCLINE <=1 SENSITIVE Sensitive     MOXIFLOXACIN <=0.25 SENSITIVE Sensitive     * ABUNDANT STAPHYLOCOCCUS AUREUS     Labs: Basic Metabolic Panel:  Recent Labs Lab 01/15/16 0236 01/16/16 0327 01/17/16 0355 01/18/16 0517 01/19/16 0505  NA 140 139 138 141 141  K 3.6 2.9* 2.8* 3.1* 3.6  CL 102 102 102 106 107  CO2 28 23 25 25 24   GLUCOSE 108* 116* 126* 111* 107*  BUN 13 10 8 10 10   CREATININE 1.03 0.92 0.85 0.87 0.80  CALCIUM 8.6* 8.6* 8.2* 8.5* 8.6*  MG  1.6* 1.7 1.9 1.8 2.0  PHOS 1.8* 2.8 3.1 2.4* 3.1   Liver Function Tests:  Recent Labs Lab 01/15/16 0236 01/16/16 0327 01/17/16 0355 01/18/16 0517 01/19/16 0505  AST  --  82*  --   --  55*  ALT  --  44  --   --  30  ALKPHOS  --  85  --   --  80  BILITOT  --  0.8  --   --  0.7  PROT  --  5.8*  --   --  5.3*  ALBUMIN 2.2* 2.2* 2.0* 2.0* 2.1*   No results for input(s): LIPASE, AMYLASE in the last 168 hours. No results for input(s): AMMONIA in the last 168 hours. CBC:  Recent Labs Lab 01/14/16 0332 01/15/16 0236 01/16/16 0327 01/17/16 0355 01/18/16 0517  WBC 10.7* 12.1* 7.7 6.0 7.6  NEUTROABS 8.7* 8.8* 5.4 4.0 5.2  HGB 12.1* 12.9* 12.0* 11.6* 11.5*  HCT 34.4* 37.0* 34.1* 32.7* 33.3*  MCV 105.8* 102.5* 100.9* 101.9* 100.6*  PLT 199 229 218 205 232   Cardiac Enzymes: No results for input(s): CKTOTAL, CKMB, CKMBINDEX, TROPONINI in the last 168 hours. BNP: BNP (last 3 results) No results for input(s): BNP in the last 8760 hours.  ProBNP (last 3 results) No results for input(s): PROBNP in the last 8760 hours.  CBG: No results for input(s): GLUCAP in the last 168 hours.     Signed:  Dia Crawford, MD [facility] [phone] pager

## 2016-01-21 NOTE — Progress Notes (Signed)
Patient sitter reported 3 loose stools. Patient not agitated at this time. Paged MD to make him aware of loose stools and to assess need for sitter at this time. Will continue to monitor.

## 2016-01-21 NOTE — Progress Notes (Signed)
No return call from MD about loose stool/ sitter. Will continue to monitor.

## 2016-01-27 DIAGNOSIS — T148 Other injury of unspecified body region: Secondary | ICD-10-CM | POA: Diagnosis not present

## 2016-01-27 DIAGNOSIS — T1490 Injury, unspecified: Secondary | ICD-10-CM | POA: Diagnosis not present

## 2016-02-16 ENCOUNTER — Emergency Department (HOSPITAL_COMMUNITY)
Admission: EM | Admit: 2016-02-16 | Discharge: 2016-02-16 | Disposition: A | Discharge status: Home / Self Care | Payer: Medicare Other | Attending: Emergency Medicine | Admitting: Emergency Medicine

## 2016-02-16 ENCOUNTER — Encounter (HOSPITAL_COMMUNITY): Payer: Self-pay | Admitting: Emergency Medicine

## 2016-02-16 ENCOUNTER — Emergency Department (HOSPITAL_COMMUNITY): Payer: Medicare Other

## 2016-02-16 DIAGNOSIS — F329 Major depressive disorder, single episode, unspecified: Secondary | ICD-10-CM | POA: Insufficient documentation

## 2016-02-16 DIAGNOSIS — I10 Essential (primary) hypertension: Secondary | ICD-10-CM | POA: Diagnosis not present

## 2016-02-16 DIAGNOSIS — W1839XA Other fall on same level, initial encounter: Secondary | ICD-10-CM | POA: Insufficient documentation

## 2016-02-16 DIAGNOSIS — S299XXA Unspecified injury of thorax, initial encounter: Secondary | ICD-10-CM | POA: Diagnosis not present

## 2016-02-16 DIAGNOSIS — R1030 Lower abdominal pain, unspecified: Secondary | ICD-10-CM | POA: Diagnosis not present

## 2016-02-16 DIAGNOSIS — Y9389 Activity, other specified: Secondary | ICD-10-CM | POA: Insufficient documentation

## 2016-02-16 DIAGNOSIS — Z8673 Personal history of transient ischemic attack (TIA), and cerebral infarction without residual deficits: Secondary | ICD-10-CM | POA: Insufficient documentation

## 2016-02-16 DIAGNOSIS — Z8546 Personal history of malignant neoplasm of prostate: Secondary | ICD-10-CM | POA: Diagnosis not present

## 2016-02-16 DIAGNOSIS — Y9289 Other specified places as the place of occurrence of the external cause: Secondary | ICD-10-CM | POA: Insufficient documentation

## 2016-02-16 DIAGNOSIS — W19XXXA Unspecified fall, initial encounter: Secondary | ICD-10-CM

## 2016-02-16 DIAGNOSIS — Y998 Other external cause status: Secondary | ICD-10-CM | POA: Insufficient documentation

## 2016-02-16 DIAGNOSIS — Z79899 Other long term (current) drug therapy: Secondary | ICD-10-CM | POA: Insufficient documentation

## 2016-02-16 DIAGNOSIS — F17210 Nicotine dependence, cigarettes, uncomplicated: Secondary | ICD-10-CM | POA: Diagnosis not present

## 2016-02-16 DIAGNOSIS — I2510 Atherosclerotic heart disease of native coronary artery without angina pectoris: Secondary | ICD-10-CM | POA: Insufficient documentation

## 2016-02-16 DIAGNOSIS — Z9889 Other specified postprocedural states: Secondary | ICD-10-CM | POA: Insufficient documentation

## 2016-02-16 DIAGNOSIS — S3991XA Unspecified injury of abdomen, initial encounter: Secondary | ICD-10-CM | POA: Diagnosis not present

## 2016-02-16 DIAGNOSIS — R0781 Pleurodynia: Secondary | ICD-10-CM | POA: Diagnosis not present

## 2016-02-16 DIAGNOSIS — Z8719 Personal history of other diseases of the digestive system: Secondary | ICD-10-CM | POA: Insufficient documentation

## 2016-02-16 DIAGNOSIS — Z8669 Personal history of other diseases of the nervous system and sense organs: Secondary | ICD-10-CM | POA: Insufficient documentation

## 2016-02-16 DIAGNOSIS — S301XXA Contusion of abdominal wall, initial encounter: Secondary | ICD-10-CM | POA: Diagnosis not present

## 2016-02-16 DIAGNOSIS — Z7982 Long term (current) use of aspirin: Secondary | ICD-10-CM | POA: Diagnosis not present

## 2016-02-16 LAB — CBC WITH DIFFERENTIAL/PLATELET
BASOS PCT: 1 %
Basophils Absolute: 0 10*3/uL (ref 0.0–0.1)
EOS ABS: 0.2 10*3/uL (ref 0.0–0.7)
EOS PCT: 3 %
HEMATOCRIT: 36 % — AB (ref 39.0–52.0)
Hemoglobin: 12.7 g/dL — ABNORMAL LOW (ref 13.0–17.0)
LYMPHS PCT: 38 %
Lymphs Abs: 2.2 10*3/uL (ref 0.7–4.0)
MCH: 34.6 pg — ABNORMAL HIGH (ref 26.0–34.0)
MCHC: 35.3 g/dL (ref 30.0–36.0)
MCV: 98.1 fL (ref 78.0–100.0)
MONO ABS: 0.3 10*3/uL (ref 0.1–1.0)
Monocytes Relative: 6 %
NEUTROS ABS: 3 10*3/uL (ref 1.7–7.7)
Neutrophils Relative %: 52 %
Platelets: 205 10*3/uL (ref 150–400)
RBC: 3.67 MIL/uL — ABNORMAL LOW (ref 4.22–5.81)
RDW: 14.4 % (ref 11.5–15.5)
WBC: 5.7 10*3/uL (ref 4.0–10.5)

## 2016-02-16 LAB — COMPREHENSIVE METABOLIC PANEL
ALT: 19 U/L (ref 17–63)
ANION GAP: 14 (ref 5–15)
AST: 34 U/L (ref 15–41)
Albumin: 3.4 g/dL — ABNORMAL LOW (ref 3.5–5.0)
Alkaline Phosphatase: 84 U/L (ref 38–126)
BUN: 8 mg/dL (ref 6–20)
CHLORIDE: 106 mmol/L (ref 101–111)
CO2: 22 mmol/L (ref 22–32)
CREATININE: 0.76 mg/dL (ref 0.61–1.24)
Calcium: 9.1 mg/dL (ref 8.9–10.3)
Glucose, Bld: 100 mg/dL — ABNORMAL HIGH (ref 65–99)
Potassium: 4.3 mmol/L (ref 3.5–5.1)
Sodium: 142 mmol/L (ref 135–145)
Total Bilirubin: 0.7 mg/dL (ref 0.3–1.2)
Total Protein: 6.3 g/dL — ABNORMAL LOW (ref 6.5–8.1)

## 2016-02-16 LAB — PROTIME-INR
INR: 1 (ref 0.00–1.49)
PROTHROMBIN TIME: 13.4 s (ref 11.6–15.2)

## 2016-02-16 MED ORDER — IOHEXOL 300 MG/ML  SOLN
100.0000 mL | Freq: Once | INTRAMUSCULAR | Status: AC | PRN
  Administered 2016-02-16: 100 mL via INTRAVENOUS

## 2016-02-16 NOTE — ED Notes (Signed)
Patient undressed, in gown, on monitor, continuous pulse oximetry and blood pressure cuff 

## 2016-02-16 NOTE — ED Notes (Signed)
Patient transported to CT 

## 2016-02-16 NOTE — ED Provider Notes (Signed)
CSN: [id number]     Arrival date & time 02/16/16  1708 History   First MD Initiated Contact with Patient 02/16/16 1714     Chief Complaint  Patient presents with  . Fall   Patient is a 70 y.o. male presenting with fall. The history is provided by the patient. No language interpreter was used.  Fall This is a new problem. The current episode started in the past 7 days. The problem occurs constantly. The problem has been unchanged. Pertinent negatives include no abdominal pain, chest pain, chills, congestion, coughing, fever, headaches, joint swelling, nausea, neck pain, numbness, rash, sore throat, vomiting or weakness. Nothing aggravates the symptoms. He has tried nothing for the symptoms. The treatment provided no relief.    Past Medical History  Diagnosis Date  . Prostate cancer ([place]) 2010    External beam radiation (urol - Risa Grill, XRT Valere Dross)  . Stroke ([place])   . Depression   . Tachycardia   . Hypertension   . Insomnia   . CAD (coronary artery disease), native coronary artery 01/10/2016    3 vessel coronary calcification noted on CT scan 04/03/15    . Fatty liver, alcoholic [id number]   Past Surgical History  Procedure Laterality Date  . None    . Cardiac catheterization N/A 01/15/2016    Procedure: Left Heart Cath and Coronary Angiography;  Surgeon: Burnell Blanks, MD;  Location: [facility];  Service: Cardiovascular;  Laterality: N/A;   Family History  Problem Relation Age of Onset  . Cancer Mother     esophageal  . Hypertension Mother   . Diabetes Mother   . Cancer Father     prostate  . Heart disease Maternal Grandfather     MI  . Cancer Paternal Grandfather     prostate   Social History  Substance Use Topics  . Smoking status: Current Every Day Smoker -- 0.50 packs/day for 30 years    Types: Cigarettes  . Smokeless tobacco: Never Used  . Alcohol Use: 4.2 oz/week    7 Shots of liquor per week     Comment: last drink yesterday    Review of  Systems  Constitutional: Negative for fever, chills, activity change and appetite change.  HENT: Negative for congestion, dental problem, ear pain, facial swelling, hearing loss, rhinorrhea, sneezing, sore throat, trouble swallowing and voice change.   Eyes: Negative for photophobia, pain, redness and visual disturbance.  Respiratory: Negative for apnea, cough, chest tightness, shortness of breath, wheezing and stridor.   Cardiovascular: Negative for chest pain, palpitations and leg swelling.  Gastrointestinal: Negative for nausea, vomiting, abdominal pain, diarrhea, constipation, blood in stool and abdominal distention.  Endocrine: Negative for polydipsia and polyuria.  Genitourinary: Positive for flank pain. Negative for frequency, hematuria, decreased urine volume and difficulty urinating.  Musculoskeletal: Negative for back pain, joint swelling, gait problem, neck pain and neck stiffness.  Skin: Negative for rash and wound.  Allergic/Immunologic: Negative for immunocompromised state.  Neurological: Negative for dizziness, syncope, facial asymmetry, speech difficulty, weakness, light-headedness, numbness and headaches.  Hematological: Negative for adenopathy.  Psychiatric/Behavioral: Negative for suicidal ideas, behavioral problems, confusion, sleep disturbance and agitation. The patient is not nervous/anxious.   All other systems reviewed and are negative.     Allergies  Celebrex  Home Medications   Prior to Admission medications   Medication Sig Start Date End Date Taking? Authorizing Provider  ALPRAZolam Duanne Moron) 0.5 MG tablet Take 0.5 mg by mouth 2 (two) times daily. 01/25/16  Yes Historical Provider, MD  aspirin 81 MG EC tablet Take 1 tablet (81 mg total) by mouth daily. 07/11/14  Yes Daniel J Angiulli, PA-C  atorvastatin (LIPITOR) 40 MG tablet Take 1 tablet (40 mg total) by mouth daily at 6 PM. 01/19/16  Yes Allie Bossier, MD  buPROPion [facility]) 100 MG tablet Take 100 mg by  mouth 2 (two) times daily. 02/10/16  Yes Historical Provider, MD  carvedilol (COREG) 6.25 MG tablet Take 1 tablet (6.25 mg total) by mouth 2 (two) times daily with a meal. 01/19/16  Yes Allie Bossier, MD  cholecalciferol (VITAMIN D) 1000 units tablet Take 2,000 Units by mouth daily.   Yes Historical Provider, MD  CIALIS 20 MG tablet Take 20 mg by mouth as needed for erectile dysfunction.  12/31/14  Yes Historical Provider, MD  citalopram (CELEXA) 40 MG tablet Take 1 tablet (40 mg total) by mouth at bedtime. 01/19/16  Yes Allie Bossier, MD  Coenzyme Q10 (CO Q-10) 200 MG CAPS Take 200 mg by mouth daily.   Yes Historical Provider, MD  folic acid (FOLVITE) 1 MG tablet Take 1 tablet (1 mg total) by mouth daily. 07/11/14  Yes Daniel J Angiulli, PA-C  lisinopril (PRINIVIL,ZESTRIL) 10 MG tablet Take 1 tablet (10 mg total) by mouth daily. 01/19/16  Yes Allie Bossier, MD  Magnesium 500 MG CAPS Take 500 mg by mouth at bedtime.   Yes Historical Provider, MD  Omega-3 Fatty Acids (FISH OIL) 1000 MG CAPS Take 1,000 mg by mouth 2 (two) times daily.    Yes Historical Provider, MD  potassium chloride SA (K-DUR,KLOR-CON) 20 MEQ tablet Take 20 mEq by mouth daily. 01/06/16  Yes Historical Provider, MD  tamsulosin (FLOMAX) 0.4 MG CAPS capsule Take 0.4 mg by mouth daily.  11/25/14  Yes Historical Provider, MD  thiamine (VITAMIN B-1) 100 MG tablet Take 100 mg by mouth daily.   Yes Historical Provider, MD  triamterene-hydrochlorothiazide (DYAZIDE) 37.5-25 MG capsule Take 1 capsule by mouth daily. 02/10/16  Yes Historical Provider, MD   BP 144/84 mmHg  Pulse 74  Temp(Src) 98.7 F (37.1 C) (Oral)  Resp 19  SpO2 97% Physical Exam  Constitutional: He is oriented to person, place, and time. He appears well-developed and well-nourished. No distress.  HENT:  Head: Normocephalic and atraumatic.  Right Ear: External ear normal.  Left Ear: External ear normal.  Eyes: Pupils are equal, round, and reactive to light. Right eye  exhibits no discharge. Left eye exhibits no discharge.  Neck: Normal range of motion. No JVD present. No tracheal deviation present.  Cardiovascular: Normal rate, regular rhythm and normal heart sounds.  Exam reveals no friction rub.   No murmur heard. Pulmonary/Chest: Effort normal and breath sounds normal. No stridor. No respiratory distress. He has no wheezes.  Abdominal: Soft. Bowel sounds are normal. He exhibits no distension. There is no rebound and no guarding.  Musculoskeletal: Normal range of motion. He exhibits no edema or tenderness.  Lymphadenopathy:    He has no cervical adenopathy.  Neurological: He is alert and oriented to person, place, and time. No cranial nerve deficit. Coordination normal.  Skin: Skin is warm and dry. No rash noted. No pallor.     Psychiatric: He has a normal mood and affect. His behavior is normal. Judgment and thought content normal.  Nursing note and vitals reviewed.   ED Course  Procedures (including critical care time) Labs Review Labs Reviewed  COMPREHENSIVE METABOLIC PANEL - Abnormal; Notable for the  following:    Glucose, Bld 100 (*)    Total Protein 6.3 (*)    Albumin 3.4 (*)    All other components within normal limits  CBC WITH DIFFERENTIAL/PLATELET - Abnormal; Notable for the following:    RBC 3.67 (*)    Hemoglobin 12.7 (*)    HCT 36.0 (*)    MCH 34.6 (*)    All other components within normal limits  PROTIME-INR    Imaging Review Dg Chest 2 View  02/16/2016  CLINICAL DATA:  Fall, left rib pain EXAM: CHEST  2 VIEW COMPARISON:  01/13/2016 FINDINGS: Normal heart size and vascularity. Streaky bibasilar densities noted, suspect atelectasis. No focal pneumonia, collapse or consolidation. No edema, effusion or pneumothorax. Trachea midline. No chest wall soft tissue abnormality or subcutaneous emphysema. Degenerative endplate changes of the thoracic spine. IMPRESSION: Bibasilar atelectasis versus scarring. No acute process by plain  radiography. Electronically Signed   By: Jerilynn Mages.  Shick M.D.   On: 02/16/2016 18:34   Ct Abdomen Pelvis W Contrast  02/16/2016  CLINICAL DATA:  70 year old male with fall. Concern for splenic injury. EXAM: CT ABDOMEN AND PELVIS WITH CONTRAST TECHNIQUE: Multidetector CT imaging of the abdomen and pelvis was performed using the standard protocol following bolus administration of intravenous contrast. CONTRAST:  176mL OMNIPAQUE IOHEXOL 300 MG/ML  SOLN COMPARISON:  CT dated 04/03/2015 FINDINGS: Evaluation is limited due to suboptimal contrast enhancement There is a patchy area of airspace density at the right lung base concerning for pneumonia. Clinical correlation is recommended. Left slight base subsegmental atelectatic changes noted. There is coronary vascular calcification. Right hilar and mediastinal calcified granuloma. No intra-abdominal free air or free fluid. Diffuse hepatic steatosis. The gallbladder, pancreas, spleen, and adrenal glands appear unremarkable. Residual contrast noted within the renal collecting systems bilaterally there is no hydronephrosis on either side. An 8 mm right renal upper pole exophytic hypodense lesion is not well characterized but may represent a cyst. Ultrasound may provide better evaluation. The visualized ureters and urinary bladder appear unremarkable. Multiple metallic densities within the prostate gland may represent brachytherapy seeds versus biopsy clips. The prostate and seminal vesicles are grossly unremarkable by size criteria. There is no evidence of bowel obstruction or inflammation. There is sigmoid diverticulosis. Normal appendix. The abdominal aorta and IVC appear grossly unremarkable. Evaluation of the vascular structures is limited due to suboptimal enhancement with contrast. No portal venous gas identified. There is no adenopathy. Small fat containing umbilical hernia. There is degenerative changes of the spine. Multiple left anterior rib old fractures noted. Old  healed fracture of the left L2 and L3 transverse process. There is also an old healed fracture of the right L2 transverse process. No definite acute fracture identified. IMPRESSION: No acute/traumatic intra-abdominal or pelvic pathology Electronically Signed   By: Anner Crete M.D.   On: 02/16/2016 21:27   I have personally reviewed and evaluated these images and lab results as part of my medical decision-making.   EKG Interpretation None      MDM   Final diagnoses:  Fall, initial encounter    Patient with history of chronic alcohol abuse, frequent falls, hydrocodone use presents for evaluation of left flank bruising that occurred after a mechanical fall 3 days ago. Patient endorses using hydrocodone and alcohol at the same time and has frequent falls. Patient denies any pain in his head, neck, chest, abdomen.  On physical exam patient with normal vital signs. Patient with normal cranial nerve exam normal strength and sensation bilateral upper and  lower extremities. He has bruising over the left flank but none on the right side.  CMP, INR, CBC unremarkable. EKG normal sinus rhythm, rate 83, PR interval 148, QRS 91, QTC 439. No ischemic changes on EKG.  Obtain CT abdomen pelvis with contrast to evaluate for splenic injury given left flank bruising. CT head and pelvis with no acute intra-abdominal pathology chest x-ray with no acute findings.  The patient continued without pain and was discharged in stable condition to follow-up with primary care physician as an outpatient.  Patient encouraged to avoid using hydrocodone alcohol the same time as this may lead to respiratory depression or increased falls.   Discussed case with my attending Dr. Sabra Heck.    Vira Blanco, MD 02/16/16 [id number]  Noemi Chapel, MD 02/17/16 [phone]

## 2016-02-16 NOTE — ED Notes (Signed)
Pt speaking loudly with visitor and can be heard at nurses station; pt appears aggitated telling visitor to leave; visitor gathering belongings and leaving contact info with RN; fall risk band applied to patients wrist; pt stating he wants to go home; RN informed patient that he is waiting on CT scan

## 2016-02-16 NOTE — ED Notes (Signed)
RN called CT to enquire about status of CT scan

## 2016-02-16 NOTE — ED Provider Notes (Signed)
I saw and evaluated the patient, reviewed the resident's note and I agree with the findings and plan.  Pertinent History: The patient is 69 years old, he has had a problem with substance abuse as well as recent falls, presents with left-sided flank and rib pain, on exam there is mild bruising, patient has a normal mental status, no signs of head injury, full supple joints and compartments, appears well, CT scan imaging reviewed and was negative, the patient is agreeable to discharge.   EKG Interpretation  Date/Time:  Tuesday February 16 2016 18:08:53 EST Ventricular Rate:  83 PR Interval:  148 QRS Duration: 91 QT Interval:  374 QTC Calculation: 439 R Axis:   78 Text Interpretation:  Sinus rhythm Normal ECG since last tracing no significant change Confirmed by Meyah Corle  MD, Cortavious Nix ([id number]) on 02/16/2016 10:11:39 PM        I personally interpreted the EKG as well as the resident and agree with the interpretation on the resident's chart.  Final diagnoses:  Fall, initial encounter       Noemi Chapel, MD 02/17/16 [phone]

## 2016-02-16 NOTE — ED Notes (Signed)
Patient comes from home Per  Patient he fell 3 days ago and  Saw his PCP who wanted to follow up to make sure he was okay. Patient has bruise to the left flank. Patient Alert and Oriented x4 on arrival able to move all extremities Per EMS Patient has had Hydrocodone with a "couple shots" of ETOH. Patient states falls occur after taking the Hydrocodone and ETOH.

## 2016-02-16 NOTE — Discharge Instructions (Signed)
Please avoid taking hydrocodone and alcohol at the same time.    Fall Prevention in the Home  Falls can cause injuries and can affect people from all age groups. There are many simple things that you can do to make your home safe and to help prevent falls.  WHAT CAN I DO ON THE OUTSIDE OF MY HOME?  Regularly repair the edges of walkways and driveways and fix any cracks.  Remove high doorway thresholds.  Trim any shrubbery on the main path into your home.  Use bright outdoor lighting.  Clear walkways of debris and clutter, including tools and rocks.  Regularly check that handrails are securely fastened and in good repair. Both sides of any steps should have handrails.  Install guardrails along the edges of any raised decks or porches.  Have leaves, snow, and ice cleared regularly.  Use sand or salt on walkways during winter months.  In the garage, clean up any spills right away, including grease or oil spills. WHAT CAN I DO IN THE BATHROOM?  Use night lights.  Install grab bars by the toilet and in the tub and shower. Do not use towel bars as grab bars.  Use non-skid mats or decals on the floor of the tub or shower.  If you need to sit down while you are in the shower, use a plastic, non-slip stool.Marland Kitchen  Keep the floor dry. Immediately clean up any water that spills on the floor.  Remove soap buildup in the tub or shower on a regular basis.  Attach bath mats securely with double-sided non-slip rug tape.  Remove throw rugs and other tripping hazards from the floor. WHAT CAN I DO IN THE BEDROOM?  Use night lights.  Make sure that a bedside light is easy to reach.  Do not use oversized bedding that drapes onto the floor.  Have a firm chair that has side arms to use for getting dressed.  Remove throw rugs and other tripping hazards from the floor. WHAT CAN I DO IN THE KITCHEN?  Clean up any spills right away.  Avoid walking on wet floors.  Place frequently used items in easy-to-reach  places.  If you need to reach for something above you, use a sturdy step stool that has a grab bar.  Keep electrical cables out of the way.  Do not use floor polish or wax that makes floors slippery. If you have to use wax, make sure that it is non-skid floor wax.  Remove throw rugs and other tripping hazards from the floor. WHAT CAN I DO IN THE STAIRWAYS?  Do not leave any items on the stairs.  Make sure that there are handrails on both sides of the stairs. Fix handrails that are broken or loose. Make sure that handrails are as long as the stairways.  Check any carpeting to make sure that it is firmly attached to the stairs. Fix any carpet that is loose or worn.  Avoid having throw rugs at the top or bottom of stairways, or secure the rugs with carpet tape to prevent them from moving.  Make sure that you have a light switch at the top of the stairs and the bottom of the stairs. If you do not have them, have them installed. WHAT ARE SOME OTHER FALL PREVENTION TIPS?  Wear closed-toe shoes that fit well and support your feet. Wear shoes that have rubber soles or low heels.  When you use a stepladder, make sure that it is completely opened and  that the sides are firmly locked. Have someone hold the ladder while you are using it. Do not climb a closed stepladder.  Add color or contrast paint or tape to grab bars and handrails in your home. Place contrasting color strips on the first and last steps.  Use mobility aids as needed, such as canes, walkers, scooters, and crutches.  Turn on lights if it is dark. Replace any light bulbs that burn out.  Set up furniture so that there are clear paths. Keep the furniture in the same spot.  Fix any uneven floor surfaces.  Choose a carpet design that does not hide the edge of steps of a stairway.  Be aware of any and all pets.  Review your medicines with your healthcare provider. Some medicines can cause dizziness or changes in blood pressure, which increase  your risk of falling. Talk with your health care provider about other ways that you can decrease your risk of falls. This may include working with a physical therapist or trainer to improve your strength, balance, and endurance.  This information is not intended to replace advice given to you by your health care provider. Make sure you discuss any questions you have with your health care provider.  Document Released: 11/25/2002 Document Revised: 04/21/2015 Document Reviewed: 01/09/2015  Elsevier Interactive Patient Education [employer].

## 2016-03-21 ENCOUNTER — Encounter (HOSPITAL_COMMUNITY): Payer: Self-pay | Admitting: Emergency Medicine

## 2016-03-21 ENCOUNTER — Emergency Department (HOSPITAL_COMMUNITY)
Admission: EM | Admit: 2016-03-21 | Discharge: 2016-03-22 | Disposition: A | Discharge status: Home / Self Care | Payer: Medicare Other | Source: Home / Self Care | Attending: Emergency Medicine | Admitting: Emergency Medicine

## 2016-03-21 DIAGNOSIS — F1310 Sedative, hypnotic or anxiolytic abuse, uncomplicated: Secondary | ICD-10-CM

## 2016-03-21 DIAGNOSIS — Z8673 Personal history of transient ischemic attack (TIA), and cerebral infarction without residual deficits: Secondary | ICD-10-CM | POA: Insufficient documentation

## 2016-03-21 DIAGNOSIS — S8012XA Contusion of left lower leg, initial encounter: Secondary | ICD-10-CM

## 2016-03-21 DIAGNOSIS — S0083XA Contusion of other part of head, initial encounter: Secondary | ICD-10-CM | POA: Insufficient documentation

## 2016-03-21 DIAGNOSIS — Z8719 Personal history of other diseases of the digestive system: Secondary | ICD-10-CM

## 2016-03-21 DIAGNOSIS — F10229 Alcohol dependence with intoxication, unspecified: Secondary | ICD-10-CM

## 2016-03-21 DIAGNOSIS — F329 Major depressive disorder, single episode, unspecified: Secondary | ICD-10-CM | POA: Insufficient documentation

## 2016-03-21 DIAGNOSIS — Y9289 Other specified places as the place of occurrence of the external cause: Secondary | ICD-10-CM | POA: Insufficient documentation

## 2016-03-21 DIAGNOSIS — G8929 Other chronic pain: Secondary | ICD-10-CM

## 2016-03-21 DIAGNOSIS — S8011XA Contusion of right lower leg, initial encounter: Secondary | ICD-10-CM | POA: Insufficient documentation

## 2016-03-21 DIAGNOSIS — Z7982 Long term (current) use of aspirin: Secondary | ICD-10-CM | POA: Insufficient documentation

## 2016-03-21 DIAGNOSIS — Z79899 Other long term (current) drug therapy: Secondary | ICD-10-CM | POA: Insufficient documentation

## 2016-03-21 DIAGNOSIS — Y9389 Activity, other specified: Secondary | ICD-10-CM

## 2016-03-21 DIAGNOSIS — I10 Essential (primary) hypertension: Secondary | ICD-10-CM | POA: Insufficient documentation

## 2016-03-21 DIAGNOSIS — Y998 Other external cause status: Secondary | ICD-10-CM

## 2016-03-21 DIAGNOSIS — F10929 Alcohol use, unspecified with intoxication, unspecified: Secondary | ICD-10-CM

## 2016-03-21 DIAGNOSIS — S29001A Unspecified injury of muscle and tendon of front wall of thorax, initial encounter: Secondary | ICD-10-CM | POA: Insufficient documentation

## 2016-03-21 DIAGNOSIS — T07XXXA Unspecified multiple injuries, initial encounter: Secondary | ICD-10-CM

## 2016-03-21 DIAGNOSIS — R000 Tachycardia, unspecified: Secondary | ICD-10-CM

## 2016-03-21 DIAGNOSIS — Z8546 Personal history of malignant neoplasm of prostate: Secondary | ICD-10-CM | POA: Insufficient documentation

## 2016-03-21 DIAGNOSIS — I2510 Atherosclerotic heart disease of native coronary artery without angina pectoris: Secondary | ICD-10-CM | POA: Insufficient documentation

## 2016-03-21 DIAGNOSIS — G4700 Insomnia, unspecified: Secondary | ICD-10-CM | POA: Insufficient documentation

## 2016-03-21 DIAGNOSIS — T148 Other injury of unspecified body region: Secondary | ICD-10-CM | POA: Diagnosis not present

## 2016-03-21 DIAGNOSIS — R51 Headache: Secondary | ICD-10-CM | POA: Diagnosis not present

## 2016-03-21 DIAGNOSIS — F10239 Alcohol dependence with withdrawal, unspecified: Secondary | ICD-10-CM | POA: Diagnosis not present

## 2016-03-21 DIAGNOSIS — R531 Weakness: Secondary | ICD-10-CM | POA: Diagnosis not present

## 2016-03-21 DIAGNOSIS — R0781 Pleurodynia: Secondary | ICD-10-CM | POA: Diagnosis not present

## 2016-03-21 DIAGNOSIS — W01198A Fall on same level from slipping, tripping and stumbling with subsequent striking against other object, initial encounter: Secondary | ICD-10-CM

## 2016-03-21 DIAGNOSIS — F1020 Alcohol dependence, uncomplicated: Secondary | ICD-10-CM

## 2016-03-21 DIAGNOSIS — S0993XA Unspecified injury of face, initial encounter: Secondary | ICD-10-CM | POA: Diagnosis not present

## 2016-03-21 DIAGNOSIS — M542 Cervicalgia: Secondary | ICD-10-CM | POA: Diagnosis not present

## 2016-03-21 NOTE — ED Notes (Signed)
Pt brought in by EMS.  Per pt he fell on Friday and landed face first.  [address] again today face first.  Pt denies loss of consciousness and reports unstable gait.  BP in field 176/102. Comes from home.

## 2016-03-21 NOTE — ED Notes (Signed)
Pt stated last drink was around 1700 this afternoon.

## 2016-03-22 ENCOUNTER — Encounter (HOSPITAL_COMMUNITY): Payer: Self-pay | Admitting: Radiology

## 2016-03-22 ENCOUNTER — Emergency Department (HOSPITAL_COMMUNITY): Payer: Medicare Other

## 2016-03-22 DIAGNOSIS — M542 Cervicalgia: Secondary | ICD-10-CM | POA: Diagnosis not present

## 2016-03-22 DIAGNOSIS — R51 Headache: Secondary | ICD-10-CM | POA: Diagnosis not present

## 2016-03-22 LAB — CBC WITH DIFFERENTIAL/PLATELET
Basophils Absolute: 0 10*3/uL (ref 0.0–0.1)
Basophils Relative: 0 %
EOS ABS: 0 10*3/uL (ref 0.0–0.7)
Eosinophils Relative: 0 %
HEMATOCRIT: 37.5 % — AB (ref 39.0–52.0)
HEMOGLOBIN: 13.5 g/dL (ref 13.0–17.0)
LYMPHS ABS: 1.2 10*3/uL (ref 0.7–4.0)
Lymphocytes Relative: 17 %
MCH: 36.2 pg — AB (ref 26.0–34.0)
MCHC: 36 g/dL (ref 30.0–36.0)
MCV: 100.5 fL — AB (ref 78.0–100.0)
MONOS PCT: 11 %
Monocytes Absolute: 0.8 10*3/uL (ref 0.1–1.0)
NEUTROS PCT: 72 %
Neutro Abs: 5.2 10*3/uL (ref 1.7–7.7)
Platelets: 162 10*3/uL (ref 150–400)
RBC: 3.73 MIL/uL — ABNORMAL LOW (ref 4.22–5.81)
RDW: 15.3 % (ref 11.5–15.5)
WBC: 7.3 10*3/uL (ref 4.0–10.5)

## 2016-03-22 LAB — COMPREHENSIVE METABOLIC PANEL
ALBUMIN: 3.8 g/dL (ref 3.5–5.0)
ALK PHOS: 86 U/L (ref 38–126)
ALT: 65 U/L — AB (ref 17–63)
ANION GAP: 26 — AB (ref 5–15)
AST: 135 U/L — ABNORMAL HIGH (ref 15–41)
BUN: 16 mg/dL (ref 6–20)
CALCIUM: 9.4 mg/dL (ref 8.9–10.3)
CO2: 16 mmol/L — AB (ref 22–32)
Chloride: 97 mmol/L — ABNORMAL LOW (ref 101–111)
Creatinine, Ser: 1.08 mg/dL (ref 0.61–1.24)
GFR calc Af Amer: >60 mL/min (ref >60)
GFR calc non Af Amer: >60 mL/min (ref >60)
GLUCOSE: 101 mg/dL — AB (ref 65–99)
Potassium: 3.3 mmol/L — ABNORMAL LOW (ref 3.5–5.1)
SODIUM: 139 mmol/L (ref 135–145)
Total Bilirubin: 2.9 mg/dL — ABNORMAL HIGH (ref 0.3–1.2)
Total Protein: 6.8 g/dL (ref 6.5–8.1)

## 2016-03-22 LAB — RAPID URINE DRUG SCREEN, HOSP PERFORMED
Amphetamines: NOT DETECTED
BARBITURATES: NOT DETECTED
Benzodiazepines: POSITIVE — AB
Cocaine: NOT DETECTED
Opiates: NOT DETECTED
TETRAHYDROCANNABINOL: NOT DETECTED

## 2016-03-22 LAB — URINALYSIS, ROUTINE W REFLEX MICROSCOPIC
GLUCOSE, UA: NEGATIVE mg/dL
HGB URINE DIPSTICK: NEGATIVE
Ketones, ur: >80 mg/dL — AB
Leukocytes, UA: NEGATIVE
Nitrite: NEGATIVE
PH: 6 (ref 5.0–8.0)
Protein, ur: 100 mg/dL — AB
SPECIFIC GRAVITY, URINE: 1.024 (ref 1.005–1.030)

## 2016-03-22 LAB — URINE MICROSCOPIC-ADD ON

## 2016-03-22 LAB — ETHANOL: Alcohol, Ethyl (B): 212 mg/dL — ABNORMAL HIGH (ref <5)

## 2016-03-22 MED ORDER — SODIUM CHLORIDE 0.9 % IV BOLUS (SEPSIS)
500.0000 mL | Freq: Once | INTRAVENOUS | Status: AC
  Administered 2016-03-22: 500 mL via INTRAVENOUS

## 2016-03-22 NOTE — ED Notes (Signed)
Patient's partner speaking to MD at this time about patient's drug/alcohol problems, and is concerned about patient being discharged.

## 2016-03-22 NOTE — ED Notes (Signed)
Pt given urinal and made aware of request for urine sample

## 2016-03-22 NOTE — Discharge Instructions (Signed)
It is important to stop drinking all forms of alcohol. You'll likely need to follow-up at a long-term treatment facility for alcoholism. Return here, if needed, for problems.   Alcohol Abuse and Nutrition Alcohol abuse is any pattern of alcohol consumption that harms your health, relationships, or work. Alcohol abuse can affect how your body breaks down and absorbs nutrients from food by causing your liver to work abnormally. Additionally, many people who abuse alcohol do not eat enough carbohydrates, protein, fat, vitamins, and minerals. This can cause poor nutrition (malnutrition) and a lack of nutrients (nutrient deficiencies), which can lead to further complications. Nutrients that are commonly lacking (deficient) among people who abuse alcohol include:  Vitamins.  Vitamin A. This is stored in your liver. It is important for your vision, metabolism, and ability to fight off infections (immunity).  B vitamins. These include vitamins such as folate, thiamin, and niacin. These are important in new cell growth and maintenance.  Vitamin C. This plays an important role in iron absorption, wound healing, and immunity.  Vitamin D. This is produced by your liver, but you can also get vitamin D from food. Vitamin D is necessary for your body to absorb and use calcium.  Minerals.  Calcium. This is important for your bones and your heart and blood vessel (cardiovascular) function.  Iron. This is important for blood, muscle, and nervous system functioning.  Magnesium. This plays an important role in muscle and nerve function, and it helps to control blood sugar and blood pressure.  Zinc. This is important for the normal function of your nervous system and digestive system (gastrointestinal tract). Nutrition is an essential component of therapy for alcohol abuse. Your health care provider or dietitian will work with you to design a plan that can help restore nutrients to your body and prevent  potential complications. WHAT IS MY PLAN? Your dietitian may develop a specific diet plan that is based on your condition and any other complications you may have. A diet plan will commonly include:  A balanced diet.  Grains: 6-8 oz per day.  Vegetables: 2-3 cups per day.  Fruits: 1-2 cups per day.  Meat and other protein: 5-6 oz per day.  Dairy: 2-3 cups per day.  Vitamin and mineral supplements. WHAT DO I NEED TO KNOW ABOUT ALCOHOL AND NUTRITION?  Consume foods that are high in antioxidants, such as grapes, berries, nuts, green tea, and dark green and orange vegetables. This can help to counteract some of the stress that is placed on your liver by consuming alcohol.  Avoid food and drinks that are high in fat and sugar. Foods such as sugared soft drinks, salty snack foods, and candy contain empty calories. This means that they lack important nutrients such as protein, fiber, and vitamins.  Eat frequent meals and snacks. Try to eat 5-6 small meals each day.  Eat a variety of fresh fruits and vegetables each day. This will help you get plenty of water, fiber, and vitamins in your diet.  Drink plenty of water and other clear fluids. Try to drink at least 48-64 oz (1.5-2 L) of water per day.  If you are a vegetarian, eat a variety of protein-rich foods. Pair whole grains with plant-based proteins at meals and snacks to obtain the greatest nutrient benefit from your food. For example, eat rice with beans, put peanut butter on whole-grain toast, or eat oatmeal with sunflower seeds.  Soak beans and whole grains overnight before cooking. This can help your body  to absorb the nutrients more easily.  Include foods fortified with vitamins and minerals in your diet. Commonly fortified foods include milk, orange juice, cereal, and bread.  If you are malnourished, your dietitian may recommend a high-protein, high-calorie diet. This may include:  2,000-3,000 calories (kilocalories) per  day.  70-100 grams of protein per day.  Your health care provider may recommend a complete nutritional supplement beverage. This can help to restore calories, protein, and vitamins to your body. Depending on your condition, you may be advised to consume this instead of or in addition to meals.  Limit your intake of caffeine. Replace drinks like coffee and black tea with decaffeinated coffee and herbal tea.  Eat a variety of foods that are high in omega fatty acids. These include fish, nuts and seeds, and soybeans. These foods may help your liver to recover and may also stabilize your mood.  Certain medicines may cause changes in your appetite, taste, and weight. Work with your health care provider and dietitian to make any adjustments to your medicines and diet plan.  Include other healthy lifestyle choices in your daily routine.  Be physically active.  Get enough sleep.  Spend time doing activities that you enjoy.  If you are unable to take in enough food and calories by mouth, your health care provider may recommend a feeding tube. This is a tube that passes through your nose and throat, directly into your stomach. Nutritional supplement beverages can be given to you through the feeding tube to help you get the nutrients you need.  Take vitamin or mineral supplements as recommended by your health care provider. WHAT FOODS CAN I EAT? Grains Enriched pasta. Enriched rice. Fortified whole-grain bread. Fortified whole-grain cereal. Barley. Brown rice. Quinoa. [place]. Vegetables All fresh, frozen, and canned vegetables. Spinach. Kale. Artichoke. Carrots. Winter squash and pumpkin. Sweet potatoes. Broccoli. Cabbage. Cucumbers. Tomatoes. Sweet peppers. Green beans. Peas. Corn. Fruits All fresh and frozen fruits. Berries. Grapes. Mango. Papaya. Guava. Cherries. Apples. Bananas. Peaches. Plums. Pineapple. Watermelon. Cantaloupe. Oranges. Avocado. Meats and Other Protein Sources Beef liver.  Lean beef. Pork. Fresh and canned chicken. Fresh fish. Oysters. Sardines. Canned tuna. Shrimp. Eggs with yolks. Nuts and seeds. Peanut butter. Beans and lentils. Soybeans. Tofu. Dairy Whole, low-fat, and nonfat milk. Whole, low-fat, and nonfat yogurt. Cottage cheese. Sour cream. Hard and soft cheeses. Beverages Water. Herbal tea. Decaffeinated coffee. Decaffeinated green tea. 100% fruit juice. 100% vegetable juice. Instant breakfast shakes. Condiments Ketchup. Mayonnaise. Mustard. Salad dressing. Barbecue sauce. Sweets and Desserts Sugar-free ice cream. Sugar-free pudding. Sugar-free gelatin. Fats and Oils Butter. Vegetable oil, flaxseed oil, olive oil, and walnut oil. Other Complete nutrition shakes. Protein bars. Sugar-free gum. The items listed above may not be a complete list of recommended foods or beverages. Contact your dietitian for more options. WHAT FOODS ARE NOT RECOMMENDED? Grains Sugar-sweetened breakfast cereals. Flavored instant oatmeal. Fried breads. Vegetables Breaded or deep-fried vegetables. Fruits Dried fruit with added sugar. Candied fruit. Canned fruit in syrup. Meats and Other Protein Sources Breaded or deep-fried meats. Dairy Flavored milks. Fried cheese curds or fried cheese sticks. Beverages Alcohol. Sugar-sweetened soft drinks. Sugar-sweetened tea. Caffeinated coffee and tea. Condiments Sugar. Honey. Agave nectar. Molasses. Sweets and Desserts Chocolate. Cake. Cookies. Candy. Other Potato chips. Pretzels. Salted nuts. Candied nuts. The items listed above may not be a complete list of foods and beverages to avoid. Contact your dietitian for more information.   This information is not intended to replace advice given to you by your  health care provider. Make sure you discuss any questions you have with your health care provider.   Document Released: 09/29/2005 Document Revised: 12/26/2014 Document Reviewed: 07/08/2014 Elsevier Interactive Patient  Education 2016 [employer].  Alcohol Intoxication Alcohol intoxication occurs when you drink enough alcohol that it affects your ability to function. It can be mild or very severe. Drinking a lot of alcohol in a short time is called binge drinking. This can be very harmful. Drinking alcohol can also be more dangerous if you are taking medicines or other drugs. Some of the effects caused by alcohol may include:  Loss of coordination.  Changes in mood and behavior.  Unclear thinking.  Trouble talking (slurred speech).  Throwing up (vomiting).  Confusion.  Slowed breathing.  Twitching and shaking (seizures).  Loss of consciousness. HOME CARE  Do not drive after drinking alcohol.  Drink enough water and fluids to keep your pee (urine) clear or pale yellow. Avoid caffeine.  Only take medicine as told by your doctor. GET HELP IF:  You throw up (vomit) many times.  You do not feel better after a few days.  You frequently have alcohol intoxication. Your doctor can help decide if you should see a substance use treatment counselor. GET HELP RIGHT AWAY IF:  You become shaky when you stop drinking.  You have twitching and shaking.  You throw up blood. It may look bright red or like coffee grounds.  You notice blood in your poop (bowel movements).  You become lightheaded or pass out (faint). MAKE SURE YOU:   Understand these instructions.  Will watch your condition.  Will get help right away if you are not doing well or get worse.   This information is not intended to replace advice given to you by your health care provider. Make sure you discuss any questions you have with your health care provider.   Document Released: 05/23/2008 Document Revised: 08/07/2013 Document Reviewed: 05/10/2013 Elsevier Interactive Patient Education 2016 [facility] Adults [employer]  211 is a great source of information about community services available.  Access by dialing 2-1-1 from anywhere in [place], or by website -  [employer].   (Updated 12/2015)  Crisis Assistance 24 hours a day   [place]  24-hour crisis assistance: [place], [place]   [facility]  24-hour crisis [place], [place]   24-hour crisis assistance: [place], [place] Access to Care Line  24-hour crisis assistance; [phone] All   [facility]  24-hour crisis response line: [phone] All   Other Local Resources (Updated 12/2015)  Inpatient Behavioral Health/Residential Substance Abuse Treatment Programs   Services      Address and Phone Number  [place] ([place])   14-day residential rehabilitation  [phone] [address] [place], [place] ([place])    Detox - private pay only  14-day residential rehabilitation -  Medicaid, insurance, private pay only [phone], or [place], [place], [place] [address]   [place] only  Multiple facilities [phone] admissions   [facility] ([place])   90-day program  Must be homeless to participate  [phone], or [place], [place] only [phone], or  [phone] [address]  [place], [place] [address]  [facility]     Must make an appointment  Transportation is offered from [place] on [address].  Accepts private pay, [facility] [phone]  [address] [place], [place] [address]   [employer]  Females only  Associated with the [place] [phone] [place], [place] [address]  [facility]   Private insurance only [phone], or [phone] [address] [place], [id number]  [place]    Detox  Residential rehabilitation  Private insurance only  Multiple locations [phone] admissions  [place] pay  Private insurance [phone] [address] [place] [address]  [facility]    Males only  Fee required at time of admission [place], [place] [address]  [place] pay only  [phone] [address] [place], [place]  [facility] ([facility])    Detox - private pay, Medicaid  Residential rehabilitation for males  - Medicare, Medicaid, insurance, private pay [phone] [place], [place] interviews Monday - Saturday from 8 am - 4 pm  Individuals with legal charges are not eligible [phone] [address] [place], [place] [address]  [facility]   Must be willing to work  Must attend Alcoholics Anonymous meetings [phone] [address] [place], [place]    Faith-based program  Private pay only [phone] [place], [place]

## 2016-03-22 NOTE — ED Notes (Signed)
Pt independent at discharge.  Discharged with wife.

## 2016-03-22 NOTE — ED Provider Notes (Signed)
CSN: [id number]     Arrival date & time 03/21/16  2327 History  By signing my name below, I, [name], attest that this documentation has been prepared under the direction and in the presence of Daleen Bo, MD . Electronically Signed: [name], Scribe. 03/22/2016. 12:31 AM.   Chief Complaint  Patient presents with  . Fall   The history is provided by the patient. No language interpreter was used.   HPI Comments:  Joshua Henson is a 70 y.o. male with PMHx of prostate cancer, stroke, HTN, and CAD who presents to the Emergency Department via EMS s/p fall yesterday complaining of facial bruising. Pt reports he fell down 4 days ago when getting out of bed, falling flat on his face. He states he fell again yesterday, but notes this fall was not as bad. Pt states he was trying to get to the bathroom and got up very quickly both times. He treated with ice with no relief of swelling. Pt reports he hasn't eaten in 3-4 days; he also reports rib pain. 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 Pt denies chest pain, headache, arm pain, or leg pain  Past Medical History  Diagnosis Date  . Prostate cancer ([place]) 2010    External beam radiation (urol - Risa Grill, XRT Valere Dross)  . Stroke ([place])   . Depression   . Tachycardia   . Hypertension   . Insomnia   . CAD (coronary artery disease), native coronary artery 01/10/2016    3 vessel coronary calcification noted on CT scan 04/03/15    . Fatty liver, alcoholic [id number]   Past Surgical History  Procedure Laterality Date  . None    . Cardiac catheterization N/A 01/15/2016    Procedure: Left Heart Cath and Coronary Angiography;  Surgeon: Burnell Blanks, MD;  Location: [facility];  Service: Cardiovascular;  Laterality: N/A;   Family History  Problem Relation Age of Onset  . Cancer Mother     esophageal  . Hypertension Mother   . Diabetes Mother   . Cancer Father     prostate  . Heart  disease Maternal Grandfather     MI  . Cancer Paternal Grandfather     prostate   Social History  Substance Use Topics  . Smoking status: Current Every Day Smoker -- 0.50 packs/day for 30 years    Types: Cigarettes  . Smokeless tobacco: Never Used  . Alcohol Use: 33.6 oz/week    56 Shots of liquor per week     Comment: last drink yesterday    Review of Systems  Constitutional: Positive for appetite change.  HENT: Positive for facial swelling.        Facial bruising  Cardiovascular: Negative for chest pain.  Musculoskeletal: Positive for arthralgias (ribs).  Skin: Positive for color change (bruising).  Neurological: Negative for headaches.  All other systems reviewed and are negative.  Allergies  Celebrex  Home Medications   Prior to Admission medications   Medication Sig Start Date End Date Taking? Authorizing Provider  ALPRAZolam Duanne Moron) 0.5 MG tablet Take 0.5 mg by mouth at bedtime.  01/25/16  Yes Historical Provider, MD  aspirin 81 MG EC tablet Take 1 tablet (81 mg total) by mouth daily. 07/11/14  Yes Daniel J Angiulli, PA-C  atorvastatin (LIPITOR) 40 MG tablet Take 1 tablet (40 mg total) by mouth daily at 6 PM. 01/19/16  Yes Allie Bossier, MD  buPROPion (WELLBUTRIN) 100 MG tablet Take 100 mg by mouth 2 (two) times daily. 02/10/16  Yes Historical Provider, MD  carvedilol (COREG) 6.25 MG tablet Take 1 tablet (6.25 mg total) by mouth 2 (two) times daily with a meal. Patient taking differently: Take 3.125 mg by mouth 2 (two) times daily with a meal.  01/19/16  Yes Allie Bossier, MD  cholecalciferol (VITAMIN D) 1000 units tablet Take 2,000 Units by mouth daily.   Yes Historical Provider, MD  CIALIS 20 MG tablet Take 20 mg by mouth as needed for erectile dysfunction.  12/31/14  Yes Historical Provider, MD  citalopram (CELEXA) 40 MG tablet Take 1 tablet (40 mg total) by mouth at bedtime. Patient taking differently: Take 20 mg by mouth at bedtime.  01/19/16  Yes Allie Bossier, MD   Coenzyme Q10 (CO Q-10) 200 MG CAPS Take 200 mg by mouth daily.   Yes Historical Provider, MD  folic acid (FOLVITE) 1 MG tablet Take 1 tablet (1 mg total) by mouth daily. 07/11/14  Yes Daniel J Angiulli, PA-C  LORazepam (ATIVAN) 1 MG tablet Take 1 mg by mouth daily as needed for anxiety.   Yes Historical Provider, MD  Magnesium 500 MG CAPS Take 500 mg by mouth at bedtime.   Yes Historical Provider, MD  Omega-3 Fatty Acids (FISH OIL) 1000 MG CAPS Take 1,000 mg by mouth 2 (two) times daily.    Yes Historical Provider, MD  potassium chloride SA (K-DUR,KLOR-CON) 20 MEQ tablet Take 20 mEq by mouth daily. 01/06/16  Yes Historical Provider, MD  tamsulosin (FLOMAX) 0.4 MG CAPS capsule Take 0.4 mg by mouth daily.  11/25/14  Yes Historical Provider, MD  thiamine (VITAMIN B-1) 100 MG tablet Take 100 mg by mouth daily.   Yes Historical Provider, MD  triamterene-hydrochlorothiazide (DYAZIDE) 37.5-25 MG capsule Take 1 capsule by mouth daily. 02/10/16  Yes Historical Provider, MD   BP 121/76 mmHg  Pulse 86  Temp(Src) 98.9 F (37.2 C) (Oral)  Resp 18  Ht 5\' 10"  (1.778 m)  Wt 200 lb (90.719 kg)  BMI 28.70 kg/m2  SpO2 94% Physical Exam  Constitutional: He is oriented to person, place, and time. He appears well-developed and well-nourished.  HENT:  Head: Normocephalic and atraumatic.  Right Ear: External ear normal.  Left Ear: External ear normal.  [place] eyes and bruise to right cheek  Eyes: Conjunctivae and EOM are normal. Pupils are equal, round, and reactive to light.  Neck: Normal range of motion and phonation normal. Neck supple.  Cardiovascular: Normal rate, regular rhythm and normal heart sounds.   Pulmonary/Chest: Effort normal and breath sounds normal. He exhibits no bony tenderness.  Diffuse chest wall TTP without crepitus or deformity  Abdominal: Soft. There is no tenderness.  Musculoskeletal: Normal range of motion.  Bruising to legs; no deformity to knees BL; normal range of motion to arms  and legs   Neurological: He is alert and oriented to person, place, and time. No cranial nerve deficit or sensory deficit. He exhibits normal muscle tone. Coordination normal.  Skin: Skin is warm, dry and intact.  Psychiatric: He has a normal mood and affect. His behavior is normal. Judgment and thought content normal.  Nursing note and vitals reviewed.   ED Course  Procedures  DIAGNOSTIC STUDIES:  Oxygen Saturation is 97% on RA, normal by my interpretation.    COORDINATION OF CARE:  12:13 AM Will administer fluids. Will order imaging, blood work, and UA. Discussed treatment plan with pt at  bedside and pt agreed to plan.  Medications  sodium chloride 0.9 % bolus 500 mL (0 mLs Intravenous Stopped 03/22/16 0129)    Patient Vitals for the past 24 hrs:  BP Temp Temp src Pulse Resp SpO2 Height Weight  03/22/16 0230 121/76 mmHg - - 86 - 94 % - -  03/22/16 0215 119/76 mmHg - - 90 - 94 % - -  03/22/16 0200 131/75 mmHg - - 85 - 95 % - -  03/21/16 2345 140/95 mmHg - - 104 18 97 % - -  03/21/16 2338 157/97 mmHg 98.9 F (37.2 C) Oral 104 - 99 % 5\' 10"  (1.778 m) 200 lb (90.719 kg)    At discharge- Reevaluation with update and discussion. After initial assessment and treatment, an updated evaluation reveals clinical exam is unchanged. Patient's friend is here with ami, lives, and states that she will take him home. She understands that he is an alcoholic and needs to stop drinking. Patient was informed of these findings. All questions were answered. Tangala Wiegert L    Labs Review Labs Reviewed  COMPREHENSIVE METABOLIC PANEL - Abnormal; Notable for the following:    Potassium 3.3 (*)    Chloride 97 (*)    CO2 16 (*)    Glucose, Bld 101 (*)    AST 135 (*)    ALT 65 (*)    Total Bilirubin 2.9 (*)    Anion gap 26 (*)    All other components within normal limits  ETHANOL - Abnormal; Notable for the following:    Alcohol, Ethyl (B) 212 (*)    All other components within normal limits  CBC  WITH DIFFERENTIAL/PLATELET - Abnormal; Notable for the following:    RBC 3.73 (*)    HCT 37.5 (*)    MCV 100.5 (*)    MCH 36.2 (*)    All other components within normal limits  URINALYSIS, ROUTINE W REFLEX MICROSCOPIC (NOT AT [facility]) - Abnormal; Notable for the following:    Color, Urine AMBER (*)    Bilirubin Urine MODERATE (*)    Ketones, ur >80 (*)    Protein, ur 100 (*)    All other components within normal limits  URINE RAPID DRUG SCREEN, HOSP PERFORMED - Abnormal; Notable for the following:    Benzodiazepines POSITIVE (*)    All other components within normal limits  URINE MICROSCOPIC-ADD ON - Abnormal; Notable for the following:    Squamous Epithelial / LPF 0-5 (*)    Bacteria, UA FEW (*)    Casts HYALINE CASTS (*)    All other components within normal limits    Imaging Review Ct Head Wo Contrast  03/22/2016  CLINICAL DATA:  Pain EXAM: CT HEAD WITHOUT CONTRAST CT MAXILLOFACIAL WITHOUT CONTRAST CT CERVICAL SPINE WITHOUT CONTRAST TECHNIQUE: Multidetector CT imaging of the head, cervical spine, and maxillofacial structures were performed using the standard protocol without intravenous contrast. Multiplanar CT image reconstructions of the cervical spine and maxillofacial structures were also generated. COMPARISON:  None. FINDINGS: CT HEAD FINDINGS There is no intracranial hemorrhage, mass or evidence of acute infarction. There is mild generalized atrophy. There is mild chronic microvascular ischemic change. There is no significant extra-axial fluid collection. No acute intracranial findings are evident. No acute bony abnormality is evident. CT MAXILLOFACIAL FINDINGS Orbits are unremarkable. No maxillofacial fractures. 2 cm retention cyst in the left maxillary antrum, and membrane thickening in the floor of the right maxillary sinus. Frontal sinuses, ethmoid air cells and sphenoid sinuses are clear. Mastoid  air cells are clear. CT CERVICAL SPINE FINDINGS The vertebral column, pedicles and  facet articulations are intact. There is no evidence of acute fracture. No acute soft tissue abnormalities are evident. Moderately severe cervical degenerative disc disease is present at C5-6. Mild-to-moderate mid cervical facet arthritis is present. No bone lesion or bony destruction. No evidence of acute fracture. No acute soft tissue abnormality. IMPRESSION: 1. No acute intracranial findings. There is mild generalized atrophy and chronic white matter hypodensity which likely represents small vessel disease. 2. Left maxillary antrum retention cyst. Mild membrane thickening in the floor of the right maxillary sinus. Negative for acute maxillofacial fracture. Next 3. Negative for acute cervical spine fracture. Moderately severe degenerative disc changes at C5-6. Electronically Signed   By: Andreas Newport M.D.   On: 03/22/2016 01:56   Ct Cervical Spine Wo Contrast  03/22/2016  CLINICAL DATA:  Pain EXAM: CT HEAD WITHOUT CONTRAST CT MAXILLOFACIAL WITHOUT CONTRAST CT CERVICAL SPINE WITHOUT CONTRAST TECHNIQUE: Multidetector CT imaging of the head, cervical spine, and maxillofacial structures were performed using the standard protocol without intravenous contrast. Multiplanar CT image reconstructions of the cervical spine and maxillofacial structures were also generated. COMPARISON:  None. FINDINGS: CT HEAD FINDINGS There is no intracranial hemorrhage, mass or evidence of acute infarction. There is mild generalized atrophy. There is mild chronic microvascular ischemic change. There is no significant extra-axial fluid collection. No acute intracranial findings are evident. No acute bony abnormality is evident. CT MAXILLOFACIAL FINDINGS Orbits are unremarkable. No maxillofacial fractures. 2 cm retention cyst in the left maxillary antrum, and membrane thickening in the floor of the right maxillary sinus. Frontal sinuses, ethmoid air cells and sphenoid sinuses are clear. Mastoid air cells are clear. CT CERVICAL SPINE  FINDINGS The vertebral column, pedicles and facet articulations are intact. There is no evidence of acute fracture. No acute soft tissue abnormalities are evident. Moderately severe cervical degenerative disc disease is present at C5-6. Mild-to-moderate mid cervical facet arthritis is present. No bone lesion or bony destruction. No evidence of acute fracture. No acute soft tissue abnormality. IMPRESSION: 1. No acute intracranial findings. There is mild generalized atrophy and chronic white matter hypodensity which likely represents small vessel disease. 2. Left maxillary antrum retention cyst. Mild membrane thickening in the floor of the right maxillary sinus. Negative for acute maxillofacial fracture. Next 3. Negative for acute cervical spine fracture. Moderately severe degenerative disc changes at C5-6. Electronically Signed   By: Andreas Newport M.D.   On: 03/22/2016 01:56   Ct Maxillofacial Wo Cm  03/22/2016  CLINICAL DATA:  Pain EXAM: CT HEAD WITHOUT CONTRAST CT MAXILLOFACIAL WITHOUT CONTRAST CT CERVICAL SPINE WITHOUT CONTRAST TECHNIQUE: Multidetector CT imaging of the head, cervical spine, and maxillofacial structures were performed using the standard protocol without intravenous contrast. Multiplanar CT image reconstructions of the cervical spine and maxillofacial structures were also generated. COMPARISON:  None. FINDINGS: CT HEAD FINDINGS There is no intracranial hemorrhage, mass or evidence of acute infarction. There is mild generalized atrophy. There is mild chronic microvascular ischemic change. There is no significant extra-axial fluid collection. No acute intracranial findings are evident. No acute bony abnormality is evident. CT MAXILLOFACIAL FINDINGS Orbits are unremarkable. No maxillofacial fractures. 2 cm retention cyst in the left maxillary antrum, and membrane thickening in the floor of the right maxillary sinus. Frontal sinuses, ethmoid air cells and sphenoid sinuses are clear. Mastoid air  cells are clear. CT CERVICAL SPINE FINDINGS The vertebral column, pedicles and facet articulations are intact. There is no  evidence of acute fracture. No acute soft tissue abnormalities are evident. Moderately severe cervical degenerative disc disease is present at C5-6. Mild-to-moderate mid cervical facet arthritis is present. No bone lesion or bony destruction. No evidence of acute fracture. No acute soft tissue abnormality. IMPRESSION: 1. No acute intracranial findings. There is mild generalized atrophy and chronic white matter hypodensity which likely represents small vessel disease. 2. Left maxillary antrum retention cyst. Mild membrane thickening in the floor of the right maxillary sinus. Negative for acute maxillofacial fracture. Next 3. Negative for acute cervical spine fracture. Moderately severe degenerative disc changes at C5-6. Electronically Signed   By: Andreas Newport M.D.   On: 03/22/2016 01:56   I have personally reviewed and evaluated these images and lab results as part of my medical decision-making.   EKG Interpretation None      MDM   Final diagnoses:  Contusion, multiple sites  Alcohol intoxication, with unspecified complication ([place])  Alcoholism ([place])    Alcohol intoxication with alcoholism. Falls are likely secondary to ataxia associated with drinking. No serious injury. Patient stable for discharge with his friend who will watch him at home. Patient will be given instructions, written and verbally for follow-up of alcohol abuse and likely require admission to a long-term treatment facility. At the time of discharge, no DTs, or additional complaints required additional evaluations.  Nursing Notes Reviewed/ Care Coordinated Applicable Imaging Reviewed Interpretation of Laboratory Data incorporated into ED treatment  The patient appears reasonably screened and/or stabilized for discharge and I doubt any other medical condition or other [facility] requiring further screening,  evaluation, or treatment in the ED at this time prior to discharge.  Plan: Home Medications- none; Home Treatments-stop drinking,  rest; return here if the recommended treatment, does not improve the symptoms; Recommended follow up- PCP prn   I personally performed the services described in this documentation, which was scribed in my presence. The recorded information has been reviewed and is accurate.     Daleen Bo, MD 03/22/16 [id number]

## 2016-03-23 ENCOUNTER — Encounter (HOSPITAL_COMMUNITY): Payer: Self-pay

## 2016-03-23 ENCOUNTER — Emergency Department (HOSPITAL_COMMUNITY): Payer: Medicare Other

## 2016-03-23 ENCOUNTER — Inpatient Hospital Stay (HOSPITAL_COMMUNITY)
Admission: EM | Admit: 2016-03-23 | Discharge: 2016-04-04 | DRG: 896 | Disposition: A | Discharge status: Skilled Nursing Facility | Payer: Medicare Other | Attending: Internal Medicine | Admitting: Internal Medicine

## 2016-03-23 DIAGNOSIS — Y92230 Patient room in hospital as the place of occurrence of the external cause: Secondary | ICD-10-CM | POA: Diagnosis not present

## 2016-03-23 DIAGNOSIS — F10231 Alcohol dependence with withdrawal delirium: Secondary | ICD-10-CM | POA: Diagnosis present

## 2016-03-23 DIAGNOSIS — Z833 Family history of diabetes mellitus: Secondary | ICD-10-CM

## 2016-03-23 DIAGNOSIS — F419 Anxiety disorder, unspecified: Secondary | ICD-10-CM | POA: Diagnosis present

## 2016-03-23 DIAGNOSIS — F10931 Alcohol use, unspecified with withdrawal delirium: Secondary | ICD-10-CM | POA: Diagnosis present

## 2016-03-23 DIAGNOSIS — Z888 Allergy status to other drugs, medicaments and biological substances status: Secondary | ICD-10-CM

## 2016-03-23 DIAGNOSIS — Z8249 Family history of ischemic heart disease and other diseases of the circulatory system: Secondary | ICD-10-CM

## 2016-03-23 DIAGNOSIS — R569 Unspecified convulsions: Secondary | ICD-10-CM | POA: Diagnosis present

## 2016-03-23 DIAGNOSIS — Z800 Family history of malignant neoplasm of digestive organs: Secondary | ICD-10-CM | POA: Diagnosis not present

## 2016-03-23 DIAGNOSIS — A0472 Enterocolitis due to Clostridium difficile, not specified as recurrent: Secondary | ICD-10-CM | POA: Diagnosis present

## 2016-03-23 DIAGNOSIS — R000 Tachycardia, unspecified: Secondary | ICD-10-CM | POA: Diagnosis not present

## 2016-03-23 DIAGNOSIS — I2510 Atherosclerotic heart disease of native coronary artery without angina pectoris: Secondary | ICD-10-CM | POA: Diagnosis present

## 2016-03-23 DIAGNOSIS — Z8546 Personal history of malignant neoplasm of prostate: Secondary | ICD-10-CM

## 2016-03-23 DIAGNOSIS — I10 Essential (primary) hypertension: Secondary | ICD-10-CM | POA: Diagnosis present

## 2016-03-23 DIAGNOSIS — E876 Hypokalemia: Secondary | ICD-10-CM | POA: Diagnosis present

## 2016-03-23 DIAGNOSIS — Z7982 Long term (current) use of aspirin: Secondary | ICD-10-CM

## 2016-03-23 DIAGNOSIS — I951 Orthostatic hypotension: Secondary | ICD-10-CM | POA: Diagnosis present

## 2016-03-23 DIAGNOSIS — Z8042 Family history of malignant neoplasm of prostate: Secondary | ICD-10-CM

## 2016-03-23 DIAGNOSIS — H9190 Unspecified hearing loss, unspecified ear: Secondary | ICD-10-CM | POA: Diagnosis present

## 2016-03-23 DIAGNOSIS — Y900 Blood alcohol level of less than 20 mg/100 ml: Secondary | ICD-10-CM | POA: Diagnosis present

## 2016-03-23 DIAGNOSIS — H578 Other specified disorders of eye and adnexa: Secondary | ICD-10-CM | POA: Diagnosis present

## 2016-03-23 DIAGNOSIS — F17210 Nicotine dependence, cigarettes, uncomplicated: Secondary | ICD-10-CM | POA: Diagnosis present

## 2016-03-23 DIAGNOSIS — K700 Alcoholic fatty liver: Secondary | ICD-10-CM | POA: Diagnosis present

## 2016-03-23 DIAGNOSIS — F10239 Alcohol dependence with withdrawal, unspecified: Secondary | ICD-10-CM | POA: Diagnosis not present

## 2016-03-23 DIAGNOSIS — G9341 Metabolic encephalopathy: Secondary | ICD-10-CM | POA: Diagnosis present

## 2016-03-23 DIAGNOSIS — R5381 Other malaise: Secondary | ICD-10-CM

## 2016-03-23 DIAGNOSIS — Z8673 Personal history of transient ischemic attack (TIA), and cerebral infarction without residual deficits: Secondary | ICD-10-CM

## 2016-03-23 DIAGNOSIS — H9319 Tinnitus, unspecified ear: Secondary | ICD-10-CM | POA: Diagnosis present

## 2016-03-23 DIAGNOSIS — R7989 Other specified abnormal findings of blood chemistry: Secondary | ICD-10-CM | POA: Diagnosis present

## 2016-03-23 DIAGNOSIS — A047 Enterocolitis due to Clostridium difficile: Secondary | ICD-10-CM | POA: Diagnosis present

## 2016-03-23 DIAGNOSIS — F1010 Alcohol abuse, uncomplicated: Secondary | ICD-10-CM | POA: Diagnosis not present

## 2016-03-23 DIAGNOSIS — H8109 Meniere's disease, unspecified ear: Secondary | ICD-10-CM | POA: Diagnosis present

## 2016-03-23 DIAGNOSIS — E860 Dehydration: Secondary | ICD-10-CM | POA: Diagnosis not present

## 2016-03-23 DIAGNOSIS — F329 Major depressive disorder, single episode, unspecified: Secondary | ICD-10-CM | POA: Diagnosis present

## 2016-03-23 DIAGNOSIS — N179 Acute kidney failure, unspecified: Secondary | ICD-10-CM | POA: Diagnosis present

## 2016-03-23 DIAGNOSIS — R531 Weakness: Secondary | ICD-10-CM | POA: Diagnosis present

## 2016-03-23 DIAGNOSIS — W19XXXA Unspecified fall, initial encounter: Secondary | ICD-10-CM | POA: Diagnosis not present

## 2016-03-23 DIAGNOSIS — Z9181 History of falling: Secondary | ICD-10-CM | POA: Diagnosis not present

## 2016-03-23 DIAGNOSIS — R4182 Altered mental status, unspecified: Secondary | ICD-10-CM | POA: Diagnosis not present

## 2016-03-23 DIAGNOSIS — Z79899 Other long term (current) drug therapy: Secondary | ICD-10-CM | POA: Diagnosis not present

## 2016-03-23 DIAGNOSIS — R296 Repeated falls: Secondary | ICD-10-CM

## 2016-03-23 DIAGNOSIS — R0781 Pleurodynia: Secondary | ICD-10-CM | POA: Diagnosis not present

## 2016-03-23 LAB — COMPREHENSIVE METABOLIC PANEL WITH GFR
ALT: 40 U/L (ref 17–63)
AST: 66 U/L — ABNORMAL HIGH (ref 15–41)
Albumin: 3.3 g/dL — ABNORMAL LOW (ref 3.5–5.0)
Alkaline Phosphatase: 74 U/L (ref 38–126)
Anion gap: 14 (ref 5–15)
BUN: 17 mg/dL (ref 6–20)
CO2: 23 mmol/L (ref 22–32)
Calcium: 9.2 mg/dL (ref 8.9–10.3)
Chloride: 98 mmol/L — ABNORMAL LOW (ref 101–111)
Creatinine, Ser: 0.92 mg/dL (ref 0.61–1.24)
GFR calc Af Amer: >60 mL/min
GFR calc non Af Amer: >60 mL/min
Glucose, Bld: 159 mg/dL — ABNORMAL HIGH (ref 65–99)
Potassium: 3.1 mmol/L — ABNORMAL LOW (ref 3.5–5.1)
Sodium: 135 mmol/L (ref 135–145)
Total Bilirubin: 2.8 mg/dL — ABNORMAL HIGH (ref 0.3–1.2)
Total Protein: 5.8 g/dL — ABNORMAL LOW (ref 6.5–8.1)

## 2016-03-23 LAB — CBC WITH DIFFERENTIAL/PLATELET
Basophils Absolute: 0 10*3/uL (ref 0.0–0.1)
Basophils Relative: 0 %
Eosinophils Absolute: 0.1 10*3/uL (ref 0.0–0.7)
Eosinophils Relative: 1 %
HCT: 32.1 % — ABNORMAL LOW (ref 39.0–52.0)
Hemoglobin: 11.8 g/dL — ABNORMAL LOW (ref 13.0–17.0)
Lymphocytes Relative: 9 %
Lymphs Abs: 0.7 10*3/uL (ref 0.7–4.0)
MCH: 37.1 pg — ABNORMAL HIGH (ref 26.0–34.0)
MCHC: 36.8 g/dL — ABNORMAL HIGH (ref 30.0–36.0)
MCV: 100.9 fL — ABNORMAL HIGH (ref 78.0–100.0)
Monocytes Absolute: 0.7 10*3/uL (ref 0.1–1.0)
Monocytes Relative: 10 %
Neutro Abs: 5.9 10*3/uL (ref 1.7–7.7)
Neutrophils Relative %: 80 %
Platelets: 141 10*3/uL — ABNORMAL LOW (ref 150–400)
RBC: 3.18 MIL/uL — ABNORMAL LOW (ref 4.22–5.81)
RDW: 15.2 % (ref 11.5–15.5)
WBC: 7.4 10*3/uL (ref 4.0–10.5)

## 2016-03-23 LAB — URINE MICROSCOPIC-ADD ON

## 2016-03-23 LAB — URINALYSIS, ROUTINE W REFLEX MICROSCOPIC
Glucose, UA: NEGATIVE mg/dL
Hgb urine dipstick: NEGATIVE
Ketones, ur: 40 mg/dL — AB
Nitrite: POSITIVE — AB
Protein, ur: 30 mg/dL — AB
Specific Gravity, Urine: 1.028 (ref 1.005–1.030)
pH: 6 (ref 5.0–8.0)

## 2016-03-23 LAB — MRSA PCR SCREENING: MRSA by PCR: NEGATIVE

## 2016-03-23 LAB — MAGNESIUM: Magnesium: 1.5 mg/dL — ABNORMAL LOW (ref 1.7–2.4)

## 2016-03-23 LAB — ETHANOL: Alcohol, Ethyl (B): <5 mg/dL

## 2016-03-23 MED ORDER — BUPROPION HCL 100 MG PO TABS
100.0000 mg | ORAL_TABLET | Freq: Two times a day (BID) | ORAL | Status: DC
Start: 2016-03-23 — End: 2016-04-04
  Administered 2016-03-23 – 2016-04-04 (×23): 100 mg via ORAL
  Filled 2016-03-23 (×26): qty 1

## 2016-03-23 MED ORDER — POTASSIUM CHLORIDE CRYS ER 20 MEQ PO TBCR
20.0000 meq | EXTENDED_RELEASE_TABLET | Freq: Every day | ORAL | Status: DC
  Administered 2016-03-23 – 2016-04-04 (×13): 20 meq via ORAL
  Filled 2016-03-23 (×16): qty 1

## 2016-03-23 MED ORDER — ATORVASTATIN CALCIUM 40 MG PO TABS
40.0000 mg | ORAL_TABLET | Freq: Every day | ORAL | Status: DC
  Administered 2016-03-23 – 2016-04-03 (×11): 40 mg via ORAL
  Filled 2016-03-23 (×14): qty 1

## 2016-03-23 MED ORDER — CARVEDILOL 3.125 MG PO TABS
3.1250 mg | ORAL_TABLET | Freq: Two times a day (BID) | ORAL | Status: DC
  Administered 2016-03-23 – 2016-04-04 (×22): 3.125 mg via ORAL
  Filled 2016-03-23 (×29): qty 1

## 2016-03-23 MED ORDER — VITAMIN D3 25 MCG (1000 UNIT) PO TABS
2000.0000 [IU] | ORAL_TABLET | Freq: Every day | ORAL | Status: DC
  Administered 2016-03-24 – 2016-04-04 (×12): 2000 [IU] via ORAL
  Filled 2016-03-23 (×12): qty 2

## 2016-03-23 MED ORDER — HYDROCODONE-ACETAMINOPHEN 10-325 MG PO TABS
1.0000 | ORAL_TABLET | Freq: Two times a day (BID) | ORAL | Status: DC | PRN
  Administered 2016-03-23 – 2016-04-04 (×13): 1 via ORAL
  Filled 2016-03-23 (×14): qty 1

## 2016-03-23 MED ORDER — CITALOPRAM HYDROBROMIDE 20 MG PO TABS
20.0000 mg | ORAL_TABLET | Freq: Every day | ORAL | Status: DC
  Administered 2016-03-23 – 2016-04-03 (×12): 20 mg via ORAL
  Filled 2016-03-23 (×13): qty 1

## 2016-03-23 MED ORDER — ENOXAPARIN SODIUM 40 MG/0.4ML ~~LOC~~ SOLN
40.0000 mg | SUBCUTANEOUS | Status: DC
  Administered 2016-03-23 – 2016-04-03 (×12): 40 mg via SUBCUTANEOUS
  Filled 2016-03-23 (×13): qty 0.4

## 2016-03-23 MED ORDER — THIAMINE HCL 100 MG/ML IJ SOLN
Freq: Once | INTRAVENOUS | Status: AC
  Administered 2016-03-23: 17:00:00 via INTRAVENOUS
  Filled 2016-03-23: qty 1000

## 2016-03-23 MED ORDER — ADULT MULTIVITAMIN W/MINERALS CH
1.0000 | ORAL_TABLET | Freq: Every day | ORAL | Status: DC
  Administered 2016-03-23 – 2016-04-04 (×13): 1 via ORAL
  Filled 2016-03-23 (×13): qty 1

## 2016-03-23 MED ORDER — ADULT MULTIVITAMIN W/MINERALS CH
1.0000 | ORAL_TABLET | Freq: Once | ORAL | Status: AC
  Administered 2016-03-23: 1 via ORAL
  Filled 2016-03-23: qty 1

## 2016-03-23 MED ORDER — ACETAMINOPHEN 325 MG PO TABS
650.0000 mg | ORAL_TABLET | Freq: Four times a day (QID) | ORAL | Status: DC | PRN
  Administered 2016-03-31 – 2016-04-02 (×3): 650 mg via ORAL
  Filled 2016-03-23 (×2): qty 2

## 2016-03-23 MED ORDER — TRIAMTERENE-HCTZ 37.5-25 MG PO CAPS
1.0000 | ORAL_CAPSULE | Freq: Every day | ORAL | Status: DC
  Administered 2016-03-23 – 2016-03-25 (×3): 1 via ORAL
  Filled 2016-03-23 (×5): qty 1

## 2016-03-23 MED ORDER — FOLIC ACID 1 MG PO TABS
1.0000 mg | ORAL_TABLET | Freq: Every day | ORAL | Status: DC
  Administered 2016-03-23 – 2016-04-04 (×13): 1 mg via ORAL
  Filled 2016-03-23 (×13): qty 1

## 2016-03-23 MED ORDER — MAGNESIUM OXIDE 400 (241.3 MG) MG PO TABS
400.0000 mg | ORAL_TABLET | Freq: Every day | ORAL | Status: DC
  Administered 2016-03-23 – 2016-04-03 (×12): 400 mg via ORAL
  Filled 2016-03-23 (×14): qty 1

## 2016-03-23 MED ORDER — ONDANSETRON HCL 4 MG PO TABS
4.0000 mg | ORAL_TABLET | Freq: Four times a day (QID) | ORAL | Status: DC | PRN
  Administered 2016-04-01: 4 mg via ORAL

## 2016-03-23 MED ORDER — VITAMIN B-1 100 MG PO TABS
100.0000 mg | ORAL_TABLET | Freq: Every day | ORAL | Status: DC
  Administered 2016-03-23 – 2016-04-04 (×13): 100 mg via ORAL
  Filled 2016-03-23 (×13): qty 1

## 2016-03-23 MED ORDER — FOLIC ACID 1 MG PO TABS
1.0000 mg | ORAL_TABLET | Freq: Once | ORAL | Status: AC
  Administered 2016-03-23: 1 mg via ORAL
  Filled 2016-03-23: qty 1

## 2016-03-23 MED ORDER — VITAMIN B-1 100 MG PO TABS
100.0000 mg | ORAL_TABLET | Freq: Once | ORAL | Status: AC
  Administered 2016-03-23: 100 mg via ORAL
  Filled 2016-03-23: qty 1

## 2016-03-23 MED ORDER — ONDANSETRON HCL 4 MG/2ML IJ SOLN: 4.0000 mg | Freq: Four times a day (QID) | INTRAMUSCULAR | Status: DC | PRN

## 2016-03-23 MED ORDER — SODIUM CHLORIDE 0.9 % IV BOLUS (SEPSIS)
1000.0000 mL | Freq: Once | INTRAVENOUS | Status: AC
  Administered 2016-03-23: 1000 mL via INTRAVENOUS

## 2016-03-23 MED ORDER — POTASSIUM CHLORIDE CRYS ER 20 MEQ PO TBCR
40.0000 meq | EXTENDED_RELEASE_TABLET | Freq: Once | ORAL | Status: AC
  Administered 2016-03-23: 40 meq via ORAL
  Filled 2016-03-23: qty 2

## 2016-03-23 MED ORDER — OMEGA-3-ACID ETHYL ESTERS 1 G PO CAPS
1.0000 g | ORAL_CAPSULE | Freq: Every day | ORAL | Status: DC
  Administered 2016-03-24 – 2016-04-04 (×11): 1 g via ORAL
  Filled 2016-03-23 (×13): qty 1

## 2016-03-23 MED ORDER — DIAZEPAM 5 MG/ML IJ SOLN
5.0000 mg | Freq: Once | INTRAMUSCULAR | Status: AC
  Administered 2016-03-23: 5 mg via INTRAVENOUS
  Filled 2016-03-23: qty 2

## 2016-03-23 MED ORDER — LORAZEPAM 2 MG/ML IJ SOLN
2.0000 mg | INTRAMUSCULAR | Status: DC | PRN
  Administered 2016-03-24 – 2016-04-03 (×49): 2 mg via INTRAVENOUS
  Filled 2016-03-23 (×48): qty 1

## 2016-03-23 MED ORDER — ASPIRIN EC 81 MG PO TBEC
81.0000 mg | DELAYED_RELEASE_TABLET | Freq: Every day | ORAL | Status: DC
  Administered 2016-03-24 – 2016-04-04 (×12): 81 mg via ORAL
  Filled 2016-03-23 (×12): qty 1

## 2016-03-23 MED ORDER — MAGNESIUM SULFATE 2 GM/50ML IV SOLN
2.0000 g | Freq: Once | INTRAVENOUS | Status: AC
  Administered 2016-03-23: 2 g via INTRAVENOUS
  Filled 2016-03-23: qty 50

## 2016-03-23 MED ORDER — TAMSULOSIN HCL 0.4 MG PO CAPS
0.4000 mg | ORAL_CAPSULE | Freq: Every day | ORAL | Status: DC
  Administered 2016-03-23 – 2016-04-04 (×13): 0.4 mg via ORAL
  Filled 2016-03-23 (×13): qty 1

## 2016-03-23 MED ORDER — HYDROMORPHONE HCL 1 MG/ML IJ SOLN
1.0000 mg | Freq: Once | INTRAMUSCULAR | Status: AC
  Administered 2016-03-23: 1 mg via INTRAVENOUS
  Filled 2016-03-23: qty 1

## 2016-03-23 MED ORDER — MORPHINE SULFATE (PF) 2 MG/ML IV SOLN
2.0000 mg | INTRAVENOUS | Status: DC | PRN
  Administered 2016-03-23 – 2016-04-03 (×27): 2 mg via INTRAVENOUS
  Filled 2016-03-23 (×27): qty 1

## 2016-03-23 MED ORDER — ACETAMINOPHEN 650 MG RE SUPP: 650.0000 mg | Freq: Four times a day (QID) | RECTAL | Status: DC | PRN

## 2016-03-23 NOTE — ED Provider Notes (Signed)
CSN: [id number]     Arrival date & time 03/23/16  1031 History   First MD Initiated Contact with Patient 03/23/16 1110     Chief Complaint  Patient presents with  . Hypotension     (Consider location/radiation/quality/duration/timing/severity/associated sxs/prior Treatment) HPI   69yM with generalized weakness. Multiple falls over last several days. Seen in ED 36 hourss ago after fall. Negative imaging. Hx of ETOH abuse with last drink two days ago. Continues to feel very weak and has fallen additional times. Face/head continues to hurt, but denies worsening pain since last seen. Chest wall pain which is worse with movement, coughing and deep breathing.   Past Medical History  Diagnosis Date  . Prostate cancer ([place]) 2010    External beam radiation (urol - Risa Grill, XRT Valere Dross)  . Stroke ([place])   . Depression   . Tachycardia   . Hypertension   . Insomnia   . CAD (coronary artery disease), native coronary artery 01/10/2016    3 vessel coronary calcification noted on CT scan 04/03/15    . Fatty liver, alcoholic [id number]   Past Surgical History  Procedure Laterality Date  . None    . Cardiac catheterization N/A 01/15/2016    Procedure: Left Heart Cath and Coronary Angiography;  Surgeon: Burnell Blanks, MD;  Location: [facility];  Service: Cardiovascular;  Laterality: N/A;   Family History  Problem Relation Age of Onset  . Cancer Mother     esophageal  . Hypertension Mother   . Diabetes Mother   . Cancer Father     prostate  . Heart disease Maternal Grandfather     MI  . Cancer Paternal Grandfather     prostate   Social History  Substance Use Topics  . Smoking status: Current Every Day Smoker -- 0.50 packs/day for 30 years    Types: Cigarettes  . Smokeless tobacco: Never Used  . Alcohol Use: 33.6 oz/week    56 Shots of liquor per week     Comment: last drink yesterday    Review of Systems    Allergies  Lisinopril and Celebrex  Home Medications    Prior to Admission medications   Medication Sig Start Date End Date Taking? Authorizing Provider  ALPRAZolam Duanne Moron) 0.5 MG tablet Take 0.5-0.75 mg by mouth at bedtime.  01/25/16  Yes Historical Provider, MD  aspirin 81 MG EC tablet Take 1 tablet (81 mg total) by mouth daily. 07/11/14  Yes Daniel J Angiulli, PA-C  atorvastatin (LIPITOR) 40 MG tablet Take 1 tablet (40 mg total) by mouth daily at 6 PM. 01/19/16  Yes Allie Bossier, MD  buPROPion [facility]) 100 MG tablet Take 100 mg by mouth 2 (two) times daily. 02/10/16  Yes Historical Provider, MD  carvedilol (COREG) 6.25 MG tablet Take 1 tablet (6.25 mg total) by mouth 2 (two) times daily with a meal. Patient taking differently: Take 3.125 mg by mouth 2 (two) times daily with a meal.  01/19/16  Yes Allie Bossier, MD  cholecalciferol (VITAMIN D) 1000 units tablet Take 2,000 Units by mouth daily.   Yes Historical Provider, MD  CIALIS 20 MG tablet Take 20 mg by mouth as needed for erectile dysfunction.  12/31/14  Yes Historical Provider, MD  citalopram (CELEXA) 40 MG tablet Take 1 tablet (40 mg total) by mouth at bedtime. Patient taking differently: Take 20 mg by mouth at bedtime.  01/19/16  Yes Allie Bossier, MD  Coenzyme Q10 (CO Q-10) 200 MG  CAPS Take 200 mg by mouth daily.   Yes Historical Provider, MD  folic acid (FOLVITE) 1 MG tablet Take 1 tablet (1 mg total) by mouth daily. 07/11/14  Yes Daniel J Angiulli, PA-C  HYDROcodone-acetaminophen (NORCO) 10-325 MG tablet Take 1 tablet by mouth 2 (two) times daily as needed for moderate pain.   Yes Historical Provider, MD  LORazepam (ATIVAN) 1 MG tablet Take 1 mg by mouth daily as needed for anxiety.   Yes Historical Provider, MD  Magnesium 500 MG CAPS Take 500 mg by mouth at bedtime.   Yes Historical Provider, MD  Omega-3 Fatty Acids (FISH OIL) 1000 MG CAPS Take 1,000 mg by mouth 2 (two) times daily.    Yes Historical Provider, MD  potassium chloride SA (K-DUR,KLOR-CON) 20 MEQ tablet Take 20 mEq by mouth  daily. 01/06/16  Yes Historical Provider, MD  tamsulosin (FLOMAX) 0.4 MG CAPS capsule Take 0.4 mg by mouth daily.  11/25/14  Yes Historical Provider, MD  thiamine (VITAMIN B-1) 100 MG tablet Take 100 mg by mouth daily.   Yes Historical Provider, MD  triamterene-hydrochlorothiazide (DYAZIDE) 37.5-25 MG capsule Take 1 capsule by mouth daily. 02/10/16  Yes Historical Provider, MD   BP 131/74 mmHg  Pulse 119  Temp(Src) 97.9 F (36.6 C) (Oral)  Resp 15  SpO2 97% Physical Exam  Constitutional: He is oriented to person, place, and time. He appears well-developed and well-nourished.  Sitting in bed. Disheveled appearance.   HENT:  Head: Normocephalic and atraumatic.  Eyes: Conjunctivae are normal. Right eye exhibits no discharge. Left eye exhibits no discharge.  Neck: Neck supple.  Cardiovascular: Normal rate, regular rhythm and normal heart sounds.  Exam reveals no gallop and no friction rub.   No murmur heard. Pulmonary/Chest: Effort normal and breath sounds normal. No respiratory distress. He exhibits tenderness.  Abdominal: Soft. He exhibits no distension. There is no tenderness.  Musculoskeletal:  Extensive Ecchymosis on all 4 extremities as well as some chest wall. No midline spinal tenderness.  Neurological: He is alert and oriented to person, place, and time. No cranial nerve deficit. He exhibits normal muscle tone. Coordination normal.  Mild global weakness. No focal motor deficit. Cranial nerves II through XII are intact.  Skin: Skin is warm and dry.  Psychiatric: He has a normal mood and affect. His behavior is normal. Thought content normal.  Nursing note and vitals reviewed.   ED Course  Procedures (including critical care time) Labs Review Labs Reviewed  COMPREHENSIVE METABOLIC PANEL - Abnormal; Notable for the following:    Potassium 3.1 (*)    Chloride 98 (*)    Glucose, Bld 159 (*)    Total Protein 5.8 (*)    Albumin 3.3 (*)    AST 66 (*)    Total Bilirubin 2.8 (*)     All other components within normal limits  CBC WITH DIFFERENTIAL/PLATELET - Abnormal; Notable for the following:    RBC 3.18 (*)    Hemoglobin 11.8 (*)    HCT 32.1 (*)    MCV 100.9 (*)    MCH 37.1 (*)    MCHC 36.8 (*)    Platelets 141 (*)    All other components within normal limits  MAGNESIUM - Abnormal; Notable for the following:    Magnesium 1.5 (*)    All other components within normal limits  ETHANOL  URINALYSIS, ROUTINE W REFLEX MICROSCOPIC (NOT AT [facility])    Imaging Review Dg Ribs Bilateral W/chest  03/23/2016  CLINICAL DATA:  several  falls recently. Was seen at [facility] Monday for same. Pt is an alcoholic. Requesting detox. Denies any etoh today. EMS reports he is hypotensive and tachycardic at 130. Hx prostate cancer. Hx HTN. Hx CAD. Smoker. BB not placed for images because pt states generalized chest and rib pain on anterior right and left sides. EXAM: BILATERAL RIBS AND CHEST - 4+ VIEW COMPARISON:  02/16/2016 FINDINGS: No fracture or other bone lesions are seen involving the ribs. There is no evidence of pneumothorax or pleural effusion. Both lungs are clear. Heart size and mediastinal contours are within normal limits. Tortuous thoracic aorta. IMPRESSION: Negative. Electronically Signed   By: Lucrezia Europe M.D.   On: 03/23/2016 13:08   Ct Head Wo Contrast  03/22/2016  CLINICAL DATA:  Pain EXAM: CT HEAD WITHOUT CONTRAST CT MAXILLOFACIAL WITHOUT CONTRAST CT CERVICAL SPINE WITHOUT CONTRAST TECHNIQUE: Multidetector CT imaging of the head, cervical spine, and maxillofacial structures were performed using the standard protocol without intravenous contrast. Multiplanar CT image reconstructions of the cervical spine and maxillofacial structures were also generated. COMPARISON:  None. FINDINGS: CT HEAD FINDINGS There is no intracranial hemorrhage, mass or evidence of acute infarction. There is mild generalized atrophy. There is mild chronic microvascular ischemic change. There is no significant  extra-axial fluid collection. No acute intracranial findings are evident. No acute bony abnormality is evident. CT MAXILLOFACIAL FINDINGS Orbits are unremarkable. No maxillofacial fractures. 2 cm retention cyst in the left maxillary antrum, and membrane thickening in the floor of the right maxillary sinus. Frontal sinuses, ethmoid air cells and sphenoid sinuses are clear. Mastoid air cells are clear. CT CERVICAL SPINE FINDINGS The vertebral column, pedicles and facet articulations are intact. There is no evidence of acute fracture. No acute soft tissue abnormalities are evident. Moderately severe cervical degenerative disc disease is present at C5-6. Mild-to-moderate mid cervical facet arthritis is present. No bone lesion or bony destruction. No evidence of acute fracture. No acute soft tissue abnormality. IMPRESSION: 1. No acute intracranial findings. There is mild generalized atrophy and chronic white matter hypodensity which likely represents small vessel disease. 2. Left maxillary antrum retention cyst. Mild membrane thickening in the floor of the right maxillary sinus. Negative for acute maxillofacial fracture. Next 3. Negative for acute cervical spine fracture. Moderately severe degenerative disc changes at C5-6. Electronically Signed   By: Andreas Newport M.D.   On: 03/22/2016 01:56   Ct Cervical Spine Wo Contrast  03/22/2016  CLINICAL DATA:  Pain EXAM: CT HEAD WITHOUT CONTRAST CT MAXILLOFACIAL WITHOUT CONTRAST CT CERVICAL SPINE WITHOUT CONTRAST TECHNIQUE: Multidetector CT imaging of the head, cervical spine, and maxillofacial structures were performed using the standard protocol without intravenous contrast. Multiplanar CT image reconstructions of the cervical spine and maxillofacial structures were also generated. COMPARISON:  None. FINDINGS: CT HEAD FINDINGS There is no intracranial hemorrhage, mass or evidence of acute infarction. There is mild generalized atrophy. There is mild chronic microvascular  ischemic change. There is no significant extra-axial fluid collection. No acute intracranial findings are evident. No acute bony abnormality is evident. CT MAXILLOFACIAL FINDINGS Orbits are unremarkable. No maxillofacial fractures. 2 cm retention cyst in the left maxillary antrum, and membrane thickening in the floor of the right maxillary sinus. Frontal sinuses, ethmoid air cells and sphenoid sinuses are clear. Mastoid air cells are clear. CT CERVICAL SPINE FINDINGS The vertebral column, pedicles and facet articulations are intact. There is no evidence of acute fracture. No acute soft tissue abnormalities are evident. Moderately severe cervical degenerative disc disease is present  at C5-6. Mild-to-moderate mid cervical facet arthritis is present. No bone lesion or bony destruction. No evidence of acute fracture. No acute soft tissue abnormality. IMPRESSION: 1. No acute intracranial findings. There is mild generalized atrophy and chronic white matter hypodensity which likely represents small vessel disease. 2. Left maxillary antrum retention cyst. Mild membrane thickening in the floor of the right maxillary sinus. Negative for acute maxillofacial fracture. Next 3. Negative for acute cervical spine fracture. Moderately severe degenerative disc changes at C5-6. Electronically Signed   By: Andreas Newport M.D.   On: 03/22/2016 01:56   Ct Maxillofacial Wo Cm  03/22/2016  CLINICAL DATA:  Pain EXAM: CT HEAD WITHOUT CONTRAST CT MAXILLOFACIAL WITHOUT CONTRAST CT CERVICAL SPINE WITHOUT CONTRAST TECHNIQUE: Multidetector CT imaging of the head, cervical spine, and maxillofacial structures were performed using the standard protocol without intravenous contrast. Multiplanar CT image reconstructions of the cervical spine and maxillofacial structures were also generated. COMPARISON:  None. FINDINGS: CT HEAD FINDINGS There is no intracranial hemorrhage, mass or evidence of acute infarction. There is mild generalized atrophy.  There is mild chronic microvascular ischemic change. There is no significant extra-axial fluid collection. No acute intracranial findings are evident. No acute bony abnormality is evident. CT MAXILLOFACIAL FINDINGS Orbits are unremarkable. No maxillofacial fractures. 2 cm retention cyst in the left maxillary antrum, and membrane thickening in the floor of the right maxillary sinus. Frontal sinuses, ethmoid air cells and sphenoid sinuses are clear. Mastoid air cells are clear. CT CERVICAL SPINE FINDINGS The vertebral column, pedicles and facet articulations are intact. There is no evidence of acute fracture. No acute soft tissue abnormalities are evident. Moderately severe cervical degenerative disc disease is present at C5-6. Mild-to-moderate mid cervical facet arthritis is present. No bone lesion or bony destruction. No evidence of acute fracture. No acute soft tissue abnormality. IMPRESSION: 1. No acute intracranial findings. There is mild generalized atrophy and chronic white matter hypodensity which likely represents small vessel disease. 2. Left maxillary antrum retention cyst. Mild membrane thickening in the floor of the right maxillary sinus. Negative for acute maxillofacial fracture. Next 3. Negative for acute cervical spine fracture. Moderately severe degenerative disc changes at C5-6. Electronically Signed   By: Andreas Newport M.D.   On: 03/22/2016 01:56   I have personally reviewed and evaluated these images and lab results as part of my medical decision-making.   EKG Interpretation   Date/Time:  Wednesday March 23 2016 13:24:31 EDT Ventricular Rate:  110 PR Interval:  143 QRS Duration: 76 QT Interval:  366 QTC Calculation: 495 R Axis:   93 Text Interpretation:  Sinus tachycardia Ventricular premature complex  Aberrant conduction of SV complex(es) Right axis deviation Borderline  repolarization abnormality Borderline prolonged QT interval ED PHYSICIAN  INTERPRETATION AVAILABLE IN [facility] Confirmed by TEST, Record  ([id number]) on 03/24/2016 6:57:26 AM      MDM   Final diagnoses:  Alcohol abuse  Alcohol withdrawal seizure, with unspecified complication ([place])  Physical deconditioning    69yM with generalized weakness. Multiple recent falls. Hx of alcohol abuse and sounds like he may have had a seizure earlier this morning. Significant other reports episode of "stiffening" which lasted a couple minutes and then confused after. Last drink 2 days ago. He would like detox, but he is not in any physical condition to go at this time. Aside from etoh abuse he is in need of acute physical rehabilitation. He can barely sit up bed by himself.     Virgel Manifold,  MD 04/01/16 1140

## 2016-03-23 NOTE — H&P (Addendum)
[facility] History and Physical  Joshua Henson [id number] DOB: 1946/03/05 DOA: 03/23/2016  Referring physician: Dr. Virgel Manifold, EDP PCP: Adella Hare, MD  Specialists:   Chief Complaint: Generalized weakness and falls  HPI: Joshua Henson is a 70 y.o. male  With a history of hypertension, tachycardia, alcoholic liver disease, that presented to the emergency department with complaints of generalized weakness and falls. Patient was recently seen in the emergency department on 03/23/2015 after sustaining several falls. Patient was not admitted at that time. He cannot go on his last alcohol consumption was.  He does state that he has been having pain with deep breathing and movement as well as cough.  Per reports from [place], wife stated the patient had brief period of stiffness and shaking this morning. No loss of bowel or bladder control, no tongue biting. In the ER, patient was found to be tachycardic. Potassium 3.1, magnesium 1.5. TRH called for admission.    Review of Systems:  Constitutional: Denies fever, chills, diaphoresis, appetite change and fatigue.  HEENT: Denies photophobia, eye pain, redness, hearing loss, ear pain, congestion, sore throat, rhinorrhea, sneezing, mouth sores, trouble swallowing, neck pain, neck stiffness and tinnitus.   Respiratory: Denies SOB, DOE, cough, chest tightness,  and wheezing.   Cardiovascular: Denies chest pain, palpitations and leg swelling.  Gastrointestinal: Denies nausea, vomiting, abdominal pain, diarrhea, constipation, blood in stool and abdominal distention.  Genitourinary: Denies dysuria, urgency, frequency, hematuria, flank pain and difficulty urinating.  Musculoskeletal: Complains of pain all over and difficulty walking.  Skin: Denies pallor, rash and wound.  Neurological: Denies dizziness, seizures, syncope, weakness, light-headedness, numbness and headaches.  Hematological: Denies adenopathy. Easy bruising, personal or family  bleeding history  Psychiatric/Behavioral: Denies suicidal ideation, mood changes, confusion, nervousness, sleep disturbance and agitation  Past Medical History  Diagnosis Date  . Prostate cancer ([place]) 2010    External beam radiation (urol - Risa Grill, XRT Valere Dross)  . Stroke ([place])   . Depression   . Tachycardia   . Hypertension   . Insomnia   . CAD (coronary artery disease), native coronary artery 01/10/2016    3 vessel coronary calcification noted on CT scan 04/03/15    . Fatty liver, alcoholic [id number]   Past Surgical History  Procedure Laterality Date  . None    . Cardiac catheterization N/A 01/15/2016    Procedure: Left Heart Cath and Coronary Angiography;  Surgeon: Burnell Blanks, MD;  Location: [facility];  Service: Cardiovascular;  Laterality: N/A;   Social History:  reports that he has been smoking Cigarettes.  He has a 15 pack-year smoking history. He has never used smokeless tobacco. He reports that he drinks about 33.6 oz of alcohol per week. He reports that he does not use illicit drugs.  Allergies  Allergen Reactions  . Lisinopril     syncope  . Celebrex [Celecoxib] Rash    Family History  Problem Relation Age of Onset  . Cancer Mother     esophageal  . Hypertension Mother   . Diabetes Mother   . Cancer Father     prostate  . Heart disease Maternal Grandfather     MI  . Cancer Paternal Grandfather     prostate    Prior to Admission medications   Medication Sig Start Date End Date Taking? Authorizing Provider  ALPRAZolam Duanne Moron) 0.5 MG tablet Take 0.5-0.75 mg by mouth at bedtime.  01/25/16  Yes Historical Provider, MD  aspirin 81 MG EC tablet Take  1 tablet (81 mg total) by mouth daily. 07/11/14  Yes Daniel J Angiulli, PA-C  atorvastatin (LIPITOR) 40 MG tablet Take 1 tablet (40 mg total) by mouth daily at 6 PM. 01/19/16  Yes Allie Bossier, MD  buPROPion [facility]) 100 MG tablet Take 100 mg by mouth 2 (two) times daily. 02/10/16  Yes Historical  Provider, MD  carvedilol (COREG) 6.25 MG tablet Take 1 tablet (6.25 mg total) by mouth 2 (two) times daily with a meal. Patient taking differently: Take 3.125 mg by mouth 2 (two) times daily with a meal.  01/19/16  Yes Allie Bossier, MD  cholecalciferol (VITAMIN D) 1000 units tablet Take 2,000 Units by mouth daily.   Yes Historical Provider, MD  CIALIS 20 MG tablet Take 20 mg by mouth as needed for erectile dysfunction.  12/31/14  Yes Historical Provider, MD  citalopram (CELEXA) 40 MG tablet Take 1 tablet (40 mg total) by mouth at bedtime. Patient taking differently: Take 20 mg by mouth at bedtime.  01/19/16  Yes Allie Bossier, MD  Coenzyme Q10 (CO Q-10) 200 MG CAPS Take 200 mg by mouth daily.   Yes Historical Provider, MD  folic acid (FOLVITE) 1 MG tablet Take 1 tablet (1 mg total) by mouth daily. 07/11/14  Yes Daniel J Angiulli, PA-C  HYDROcodone-acetaminophen (NORCO) 10-325 MG tablet Take 1 tablet by mouth 2 (two) times daily as needed for moderate pain.   Yes Historical Provider, MD  LORazepam (ATIVAN) 1 MG tablet Take 1 mg by mouth daily as needed for anxiety.   Yes Historical Provider, MD  Magnesium 500 MG CAPS Take 500 mg by mouth at bedtime.   Yes Historical Provider, MD  Omega-3 Fatty Acids (FISH OIL) 1000 MG CAPS Take 1,000 mg by mouth 2 (two) times daily.    Yes Historical Provider, MD  potassium chloride SA (K-DUR,KLOR-CON) 20 MEQ tablet Take 20 mEq by mouth daily. 01/06/16  Yes Historical Provider, MD  tamsulosin (FLOMAX) 0.4 MG CAPS capsule Take 0.4 mg by mouth daily.  11/25/14  Yes Historical Provider, MD  thiamine (VITAMIN B-1) 100 MG tablet Take 100 mg by mouth daily.   Yes Historical Provider, MD  triamterene-hydrochlorothiazide (DYAZIDE) 37.5-25 MG capsule Take 1 capsule by mouth daily. 02/10/16  Yes Historical Provider, MD   Physical Exam: Filed Vitals:   03/23/16 1106 03/23/16 1501  BP: 146/114 131/74  Pulse: 122 119  Temp: 97.9 F (36.6 C)   Resp:  15     General: Well  developed, well nourished, NAD, appears stated age  HEENT: [place], raccoon eyes-periorbital ecchymosis, PERRLA, EOMI, Anicteic Sclera, mucous membranes moist.   Neck: Supple, no JVD, no masses  Cardiovascular: S1 S2 auscultated, no rubs, murmurs or gallops. Tachycardic.   Respiratory: Clear to auscultation bilaterally with equal chest rise  Abdomen: Soft, nontender, nondistended, + bowel sounds  Extremities: warm dry without cyanosis clubbing or edema  Neuro: AAOx3, cranial nerves grossly intact. Hard of hearing. Strength equal and bilateral in upper/lower ext.  Skin: Without rashes exudates or nodules. Multiple sites of bruising  Psych: Flat affect  Labs on Admission:  Basic Metabolic Panel:  Recent Labs Lab 03/22/16 0030 03/23/16 1310 03/23/16 1311  NA 139  --  135  K 3.3*  --  3.1*  CL 97*  --  98*  CO2 16*  --  23  GLUCOSE 101*  --  159*  BUN 16  --  17  CREATININE 1.08  --  0.92  CALCIUM 9.4  --  9.2  MG  --  1.5*  --    Liver Function Tests:  Recent Labs Lab 03/22/16 0030 03/23/16 1311  AST 135* 66*  ALT 65* 40  ALKPHOS 86 74  BILITOT 2.9* 2.8*  PROT 6.8 5.8*  ALBUMIN 3.8 3.3*   No results for input(s): LIPASE, AMYLASE in the last 168 hours. No results for input(s): AMMONIA in the last 168 hours. CBC:  Recent Labs Lab 03/22/16 0030 03/23/16 1311  WBC 7.3 7.4  NEUTROABS 5.2 5.9  HGB 13.5 11.8*  HCT 37.5* 32.1*  MCV 100.5* 100.9*  PLT 162 141*   Cardiac Enzymes: No results for input(s): CKTOTAL, CKMB, CKMBINDEX, TROPONINI in the last 168 hours.  BNP (last 3 results) No results for input(s): BNP in the last 8760 hours.  ProBNP (last 3 results) No results for input(s): PROBNP in the last 8760 hours.  CBG: No results for input(s): GLUCAP in the last 168 hours.  Radiological Exams on Admission: Dg Ribs Bilateral W/chest  03/23/2016  CLINICAL DATA:  several falls recently. Was seen at [facility] Monday for same. Pt is an alcoholic. Requesting detox.  Denies any etoh today. EMS reports he is hypotensive and tachycardic at 130. Hx prostate cancer. Hx HTN. Hx CAD. Smoker. BB not placed for images because pt states generalized chest and rib pain on anterior right and left sides. EXAM: BILATERAL RIBS AND CHEST - 4+ VIEW COMPARISON:  02/16/2016 FINDINGS: No fracture or other bone lesions are seen involving the ribs. There is no evidence of pneumothorax or pleural effusion. Both lungs are clear. Heart size and mediastinal contours are within normal limits. Tortuous thoracic aorta. IMPRESSION: Negative. Electronically Signed   By: Lucrezia Europe M.D.   On: 03/23/2016 13:08   Ct Head Wo Contrast  03/22/2016  CLINICAL DATA:  Pain EXAM: CT HEAD WITHOUT CONTRAST CT MAXILLOFACIAL WITHOUT CONTRAST CT CERVICAL SPINE WITHOUT CONTRAST TECHNIQUE: Multidetector CT imaging of the head, cervical spine, and maxillofacial structures were performed using the standard protocol without intravenous contrast. Multiplanar CT image reconstructions of the cervical spine and maxillofacial structures were also generated. COMPARISON:  None. FINDINGS: CT HEAD FINDINGS There is no intracranial hemorrhage, mass or evidence of acute infarction. There is mild generalized atrophy. There is mild chronic microvascular ischemic change. There is no significant extra-axial fluid collection. No acute intracranial findings are evident. No acute bony abnormality is evident. CT MAXILLOFACIAL FINDINGS Orbits are unremarkable. No maxillofacial fractures. 2 cm retention cyst in the left maxillary antrum, and membrane thickening in the floor of the right maxillary sinus. Frontal sinuses, ethmoid air cells and sphenoid sinuses are clear. Mastoid air cells are clear. CT CERVICAL SPINE FINDINGS The vertebral column, pedicles and facet articulations are intact. There is no evidence of acute fracture. No acute soft tissue abnormalities are evident. Moderately severe cervical degenerative disc disease is present at C5-6.  Mild-to-moderate mid cervical facet arthritis is present. No bone lesion or bony destruction. No evidence of acute fracture. No acute soft tissue abnormality. IMPRESSION: 1. No acute intracranial findings. There is mild generalized atrophy and chronic white matter hypodensity which likely represents small vessel disease. 2. Left maxillary antrum retention cyst. Mild membrane thickening in the floor of the right maxillary sinus. Negative for acute maxillofacial fracture. Next 3. Negative for acute cervical spine fracture. Moderately severe degenerative disc changes at C5-6. Electronically Signed   By: Andreas Newport M.D.   On: 03/22/2016 01:56   Ct Cervical Spine Wo Contrast  03/22/2016  CLINICAL DATA:  Pain EXAM: CT HEAD WITHOUT CONTRAST CT MAXILLOFACIAL WITHOUT CONTRAST CT CERVICAL SPINE WITHOUT CONTRAST TECHNIQUE: Multidetector CT imaging of the head, cervical spine, and maxillofacial structures were performed using the standard protocol without intravenous contrast. Multiplanar CT image reconstructions of the cervical spine and maxillofacial structures were also generated. COMPARISON:  None. FINDINGS: CT HEAD FINDINGS There is no intracranial hemorrhage, mass or evidence of acute infarction. There is mild generalized atrophy. There is mild chronic microvascular ischemic change. There is no significant extra-axial fluid collection. No acute intracranial findings are evident. No acute bony abnormality is evident. CT MAXILLOFACIAL FINDINGS Orbits are unremarkable. No maxillofacial fractures. 2 cm retention cyst in the left maxillary antrum, and membrane thickening in the floor of the right maxillary sinus. Frontal sinuses, ethmoid air cells and sphenoid sinuses are clear. Mastoid air cells are clear. CT CERVICAL SPINE FINDINGS The vertebral column, pedicles and facet articulations are intact. There is no evidence of acute fracture. No acute soft tissue abnormalities are evident. Moderately severe cervical  degenerative disc disease is present at C5-6. Mild-to-moderate mid cervical facet arthritis is present. No bone lesion or bony destruction. No evidence of acute fracture. No acute soft tissue abnormality. IMPRESSION: 1. No acute intracranial findings. There is mild generalized atrophy and chronic white matter hypodensity which likely represents small vessel disease. 2. Left maxillary antrum retention cyst. Mild membrane thickening in the floor of the right maxillary sinus. Negative for acute maxillofacial fracture. Next 3. Negative for acute cervical spine fracture. Moderately severe degenerative disc changes at C5-6. Electronically Signed   By: Andreas Newport M.D.   On: 03/22/2016 01:56   Ct Maxillofacial Wo Cm  03/22/2016  CLINICAL DATA:  Pain EXAM: CT HEAD WITHOUT CONTRAST CT MAXILLOFACIAL WITHOUT CONTRAST CT CERVICAL SPINE WITHOUT CONTRAST TECHNIQUE: Multidetector CT imaging of the head, cervical spine, and maxillofacial structures were performed using the standard protocol without intravenous contrast. Multiplanar CT image reconstructions of the cervical spine and maxillofacial structures were also generated. COMPARISON:  None. FINDINGS: CT HEAD FINDINGS There is no intracranial hemorrhage, mass or evidence of acute infarction. There is mild generalized atrophy. There is mild chronic microvascular ischemic change. There is no significant extra-axial fluid collection. No acute intracranial findings are evident. No acute bony abnormality is evident. CT MAXILLOFACIAL FINDINGS Orbits are unremarkable. No maxillofacial fractures. 2 cm retention cyst in the left maxillary antrum, and membrane thickening in the floor of the right maxillary sinus. Frontal sinuses, ethmoid air cells and sphenoid sinuses are clear. Mastoid air cells are clear. CT CERVICAL SPINE FINDINGS The vertebral column, pedicles and facet articulations are intact. There is no evidence of acute fracture. No acute soft tissue abnormalities are  evident. Moderately severe cervical degenerative disc disease is present at C5-6. Mild-to-moderate mid cervical facet arthritis is present. No bone lesion or bony destruction. No evidence of acute fracture. No acute soft tissue abnormality. IMPRESSION: 1. No acute intracranial findings. There is mild generalized atrophy and chronic white matter hypodensity which likely represents small vessel disease. 2. Left maxillary antrum retention cyst. Mild membrane thickening in the floor of the right maxillary sinus. Negative for acute maxillofacial fracture. Next 3. Negative for acute cervical spine fracture. Moderately severe degenerative disc changes at C5-6. Electronically Signed   By: Andreas Newport M.D.   On: 03/22/2016 01:56    EKG: Independently reviewed. Sinus tachycardia, rate 110, right axis deviation  Assessment/Plan  Alcohol withdrawal -Admit to stepdown -Place on CIWA protocol, banana bag, followed by folic acid, thiamine, multivitamin -Blood  alcohol level <5 -Pending UDS -Patient counseled and would like to see rehab treatment  Possible seizure -Reported by the wife to EDP -Will order EEG -Placed on seizure precautions  Essential hypertension -Continue Dyazide, Coreg  Elevated LFTs/fatty liver -Secondary to alcohol abuse -LFTs currently stable, continue to monitor CMP  Tachycardia -Continue Coreg -Patient does have history of tachycardia, possibly worsened by alcohol withdrawal  Hypomagnesemia/hypokalemia -Patient does take magnesium and potassium supplements at home -Will replace IV and continue to monitor -Nutrition consulted  Frequent falls/physical deconditioning -PT and OT consulted -Patient presented to the emergency department on 03/22/2016 and had x-ray of the ribs and chest which was negative, CT of the head/maxillofacial which showed no acute intracranial findings, CT of the cervical spine which was negative for acute fractures  Depression/Anxiety -Continue  bupropion, Celexa -Home ativan and Xanax held -Placed on CIWA protocol  DVT prophylaxis: Lovenox  Code Status: Full  Condition: Guarded  Family Communication: None at bedside. Admission, patients condition and plan of care including tests being ordered have been discussed with the patient, who indicates understanding and agrees with the plan and Code Status.  Disposition Plan: Admitted to stepdown  Time spent: 70 minutes  Angelika Jerrett D.O. [facility] Pager [phone]  If 7PM-7AM, please contact night-coverage [email or web address] Password [email or web address] 03/23/2016, 3:40 PM

## 2016-03-23 NOTE — ED Notes (Signed)
MD at bedside. Hospitalist

## 2016-03-23 NOTE — ED Notes (Signed)
Pt's wife reports pt does not have c-diff-last time he was treated was about 2 years ago.

## 2016-03-23 NOTE — ED Notes (Signed)
Per EMS, pt from home, reports several falls recently.  Was seen at [facility] Monday for same.  Pt is an alcoholic.  Requesting detox.  Denies any etoh today.  EMS reports he is hypotensive and tachycardic at 130.

## 2016-03-24 ENCOUNTER — Inpatient Hospital Stay (HOSPITAL_COMMUNITY)
Admit: 2016-03-24 | Discharge: 2016-03-24 | Disposition: A | Discharge status: Home / Self Care | Payer: Medicare Other | Attending: Internal Medicine | Admitting: Internal Medicine

## 2016-03-24 DIAGNOSIS — R4182 Altered mental status, unspecified: Secondary | ICD-10-CM

## 2016-03-24 LAB — COMPREHENSIVE METABOLIC PANEL
ALT: 34 U/L (ref 17–63)
AST: 53 U/L — AB (ref 15–41)
Albumin: 2.9 g/dL — ABNORMAL LOW (ref 3.5–5.0)
Alkaline Phosphatase: 65 U/L (ref 38–126)
Anion gap: 9 (ref 5–15)
BILIRUBIN TOTAL: 1.6 mg/dL — AB (ref 0.3–1.2)
BUN: 13 mg/dL (ref 6–20)
CHLORIDE: 105 mmol/L (ref 101–111)
CO2: 23 mmol/L (ref 22–32)
CREATININE: 0.72 mg/dL (ref 0.61–1.24)
Calcium: 8.6 mg/dL — ABNORMAL LOW (ref 8.9–10.3)
GFR calc non Af Amer: >60 mL/min (ref >60)
Glucose, Bld: 129 mg/dL — ABNORMAL HIGH (ref 65–99)
POTASSIUM: 3.2 mmol/L — AB (ref 3.5–5.1)
Sodium: 137 mmol/L (ref 135–145)
TOTAL PROTEIN: 5.3 g/dL — AB (ref 6.5–8.1)

## 2016-03-24 LAB — CBC
HCT: 26.5 % — ABNORMAL LOW (ref 39.0–52.0)
Hemoglobin: 9.4 g/dL — ABNORMAL LOW (ref 13.0–17.0)
MCH: 35.3 pg — ABNORMAL HIGH (ref 26.0–34.0)
MCHC: 35.5 g/dL (ref 30.0–36.0)
MCV: 99.6 fL (ref 78.0–100.0)
PLATELETS: 138 10*3/uL — AB (ref 150–400)
RBC: 2.66 MIL/uL — AB (ref 4.22–5.81)
RDW: 15.2 % (ref 11.5–15.5)
WBC: 4.9 10*3/uL (ref 4.0–10.5)

## 2016-03-24 LAB — INFLUENZA PANEL BY PCR (TYPE A & B)
H1N1 flu by pcr: NOT DETECTED
Influenza A By PCR: NEGATIVE
Influenza B By PCR: NEGATIVE

## 2016-03-24 LAB — MAGNESIUM: Magnesium: 1.9 mg/dL (ref 1.7–2.4)

## 2016-03-24 MED ORDER — BOOST PLUS PO LIQD
237.0000 mL | Freq: Three times a day (TID) | ORAL | Status: DC
  Administered 2016-03-24 – 2016-04-03 (×21): 237 mL via ORAL
  Filled 2016-03-24 (×34): qty 237

## 2016-03-24 MED ORDER — HALOPERIDOL LACTATE 5 MG/ML IJ SOLN
INTRAMUSCULAR | Status: AC
  Filled 2016-03-24: qty 1

## 2016-03-24 MED ORDER — HALOPERIDOL LACTATE 5 MG/ML IJ SOLN
2.5000 mg | Freq: Once | INTRAMUSCULAR | Status: AC
  Administered 2016-03-24: 2.5 mg via INTRAVENOUS

## 2016-03-24 NOTE — Progress Notes (Signed)
Pt transferred to 5east. Report called and given to [facility] on 5east.

## 2016-03-24 NOTE — Evaluation (Signed)
Physical Therapy Evaluation Patient Details Name: Joshua Henson MRN: [id number] DOB: 09-28-1946 Today's Date: 03/24/2016   History of Present Illness  70 yo male admitted 03/23/16 With a history of hypertension, tachycardia, alcoholic liver disease, that presented to the emergency department with complaints of generalized weakness and falls.  wife stated the patient had brief period of stiffness and shaking this morning.   Clinical Impression  Joshua Henson is pleasant, very weak. Able to stand. Should progress to ambulation with RW. Pat agreeable to SNF : if I need it".  Pt admitted with above diagnosis. Pt currently with functional limitations due to the deficits listed below (see PT Problem List).  Pt will benefit from skilled PT to increase their independence and safety with mobility to allow discharge to the venue listed below.       Follow Up Recommendations SNF;Supervision/Assistance - 24 hour (patient states has been to [facility].)    Equipment Recommendations  None recommended by PT    Recommendations for Other Services       Precautions / Restrictions Precautions Precautions: Fall Precaution Comments: pt was on bedrest:  OK to see per RN, but put him back to bed due to seizure precautions.  Watch HR, incontinent of BM/urine Restrictions Weight Bearing Restrictions: No      Mobility  Bed Mobility Overal bed mobility: Needs Assistance Bed Mobility: Rolling Rolling: Independent   Supine to sit: Min guard Sit to supine: Min guard   General bed mobility comments: cues  for safety, incontinent oof BM, assisted to washed up.  Transfers Overall transfer level: Needs assistance Equipment used: Rolling walker (2 wheeled) Transfers: Sit to/from Stand Sit to Stand: Min assist         General transfer comment: cues for safety, wide base  Ambulation/Gait Ambulation/Gait assistance: Min assist;+2 safety/equipment   Assistive device: Rolling walker (2 wheeled)        General Gait Details: 5 sidesteps along the bed, HR 130's with mobility, sats wfl on RA.  Stairs            Wheelchair Mobility    Modified Rankin (Stroke Patients Only)       Balance Overall balance assessment: Needs assistance;History of Falls Sitting-balance support: Bilateral upper extremity supported;Feet supported Sitting balance-Leahy Scale: Fair     Standing balance support: During functional activity;Bilateral upper extremity supported Standing balance-Leahy Scale: Poor                               Pertinent Vitals/Pain Pain Assessment: 0-10 Pain Score: 7  Pain Location: chest wall Pain Descriptors / Indicators: Tightness;Discomfort;Grimacing;Guarding Pain Intervention(s): Limited activity within patient's tolerance;Monitored during session;Patient requesting pain meds-RN notified    Home Living Family/patient expects to be discharged to:: Private residence Living Arrangements: Spouse/significant other Available Help at Discharge: Family;Available PRN/intermittently;Available 24 hours/day   Home Access: Stairs to enter   Entrance Stairs-Number of Steps: 3 Home Layout: Two level (bed and bath upstairs) Home Equipment: Walker - 2 wheels;Shower seat - built in;Bedside commode;Grab bars - tub/shower      Prior Function Level of Independence: Independent               Hand Dominance        Extremity/Trunk Assessment   Upper Extremity Assessment: Defer to OT evaluation           Lower Extremity Assessment: Generalized weakness      Cervical / Trunk Assessment: Normal  Communication   Communication: HOH (wears hearing aides)  Cognition Arousal/Alertness: Awake/alert Behavior During Therapy: WFL for tasks assessed/performed Overall Cognitive Status: Within Functional Limits for tasks assessed                      General Comments      Exercises        Assessment/Plan    PT Assessment Patient needs  continued PT services  PT Diagnosis Difficulty walking;Generalized weakness   PT Problem List Decreased strength;Decreased activity tolerance;Decreased balance;Decreased mobility;Decreased knowledge of precautions;Decreased safety awareness;Cardiopulmonary status limiting activity;Decreased knowledge of use of DME  PT Treatment Interventions DME instruction;Gait training;Functional mobility training;Therapeutic activities;Patient/family education;Balance training;Therapeutic exercise   PT Goals (Current goals can be found in the Care Plan section) Acute Rehab PT Goals Patient Stated Goal: to get stronger PT Goal Formulation: With patient Time For Goal Achievement: 04/07/16 Potential to Achieve Goals: Good    Frequency Min 3X/week   Barriers to discharge        Co-evaluation PT/OT/SLP Co-Evaluation/Treatment: Yes Reason for Co-Treatment: For patient/therapist safety PT goals addressed during session: Mobility/safety with mobility OT goals addressed during session: ADL's and self-care       End of Session   Activity Tolerance: Patient tolerated treatment well Patient left: in bed;with call bell/phone within reach;with bed alarm set Nurse Communication: Mobility status         Time: [id number] PT Time Calculation (min) (ACUTE ONLY): 32 min   Charges:   PT Evaluation $PT Eval Low Complexity: 1 Procedure     PT G CodesClaretha Cooper 03/24/2016, 8:56 AM Tresa Endo PT [phone]

## 2016-03-24 NOTE — Care Management Note (Signed)
Case Management Note  Patient Details  Name: Joshua Henson MRN: [id number] Date of Birth: 17-Nov-1946  Subjective/Objective:        etoh withdrawal and hypotension            Action/Plan:Date:  March 24, 2016 Chart reviewed for concurrent status and case management needs. Will continue to follow patient for changes and needs: [name], BSN, RN, [place]   [phone]   Expected Discharge Date:   (UNKNOWN)               Expected Discharge Plan:  Skilled Nursing Facility  In-House Referral:  Clinical Social Work  Discharge planning Services  CM Consult  Post Acute Care Choice:  NA Choice offered to:  NA  DME Arranged:    DME Agency:     HH Arranged:    [place] Agency:     Status of Service:  In process, will continue to follow  Medicare Important Message Given:    Date Medicare IM Given:    Medicare IM give by:    Date Additional Medicare IM Given:    Additional Medicare Important Message give by:     If discussed at [place] of Stay Meetings, dates discussed:    Additional Comments:  Leeroy Cha, RN 03/24/2016, 11:53 AM

## 2016-03-24 NOTE — Progress Notes (Signed)
Offsite EEG completed at [facility]; results pending. 

## 2016-03-24 NOTE — Progress Notes (Signed)
PROGRESS NOTE  Joshua Henson [id number] DOB: 09-Oct-1946 DOA: 03/23/2016 PCP: Mayra Neer, MD Outpatient Specialists:    LOS: 1 day   Brief Narrative: 70 y.o. male with a history of hypertension, tachycardia, alcoholic liver disease, that presented to the emergency department with complaints of generalized weakness and falls. Patient was recently seen in the emergency department on 03/23/2015 after sustaining several falls. Patient was not admitted at that time. Last drink per pt 4/4. He does state that he has been having pain with deep breathing and movement as well as cough. Per reports from [place], wife stated the patient had brief period of stiffness and shaking the morning of admission on 4/5  Assessment & Plan: Active Problems:   Alcohol abuse   Alcohol withdrawal (HCC)   Fatty liver, alcoholic   Frequent falls   Hypokalemia   Hypomagnesemia   Essential hypertension   Alcohol withdrawal - monitor in SDU today, continue CIWA. Most recent scoring at 67 - Blood alcohol level <5 - Pending UDS  Possible seizure - Reported by the wife to EDP - EEG pending - Placed on seizure precautions - Likely in the setting of alcohol withdrawal, continue to monitor the stepdown  Essential hypertension - Continue Dyazide, Coreg  Elevated LFTs/fatty liver - Secondary to alcohol abuse - LFTs currently stable, continue to monitor CMP  Tachycardia - Continue Coreg - Patient does have history of tachycardia, possibly worsened by alcohol withdrawal  Hypomagnesemia/hypokalemia - Patient does take magnesium and potassium supplements at home - Will replace IV and continue to monitor - Nutrition consulted  Frequent falls/physical deconditioning - PT and OT consulted - Patient presented to the emergency department on 03/22/2016 and had x-ray of the ribs and chest which was negative, CT of the head/maxillofacial which showed no acute intracranial findings, CT of the cervical spine which  was negative for acute fractures  Depression/Anxiety - Continue bupropion, Celexa - Home ativan and Xanax held - Placed on CIWA protocol   DVT prophylaxis: Lovenox Code Status: Full code Family Communication: No family at bedside Disposition Plan: Home when ready, PT evaluation pending Barriers for discharge: Alcohol withdrawal  Consultants:   None  Procedures:   None   Antimicrobials:  None    Subjective: - Feeling a bit tremulous this morning, denies any lightheadedness or dizziness. Denies any chest pain or palpitations. Denies shortness of breath.  Objective: Filed Vitals:   03/24/16 1000 03/24/16 1100 03/24/16 1157 03/24/16 1200  BP: 134/75  134/75   Pulse: 107 110    Temp:    98 F (36.7 C)  TempSrc:    Oral  Resp: 19 14    Height:      Weight:      SpO2: 95% 95%      Intake/Output Summary (Last 24 hours) at 03/24/16 1253 Last data filed at 03/24/16 0800  Gross per 24 hour  Intake 2208.33 ml  Output    550 ml  Net 1658.33 ml   Filed Weights   03/23/16 1650 03/24/16 0257  Weight: 87.9 kg (193 lb 12.6 oz) 90.5 kg (199 lb 8.3 oz)    Examination: Constitutional: Mildly tremulous Filed Vitals:   03/24/16 1000 03/24/16 1100 03/24/16 1157 03/24/16 1200  BP: 134/75  134/75   Pulse: 107 110    Temp:    98 F (36.7 C)  TempSrc:    Oral  Resp: 19 14    Height:      Weight:      SpO2: 95% 95%  Eyes: PERRL, periorbital ecchymoses  ENMT: Mucous membranes are moist.  Respiratory: clear to auscultation bilaterally, no wheezing, no crackles. Normal respiratory effort. No accessory muscle use.  Cardiovascular: Regular rate and rhythm, no murmurs / rubs / gallops. No extremity edema. 2+ pedal pulses. Abdomen: no tenderness, no masses palpated. Bowel sounds positive.  Musculoskeletal: no clubbing / cyanosis. No joint deformity upper and lower extremities. Normal muscle tone. Neurologic: Nonfocal Psychiatric: Appears anxious   Data Reviewed: I have  personally reviewed following labs and imaging studies  CBC:  Recent Labs Lab 03/22/16 0030 03/23/16 1311 03/24/16 0321  WBC 7.3 7.4 4.9  NEUTROABS 5.2 5.9  --   HGB 13.5 11.8* 9.4*  HCT 37.5* 32.1* 26.5*  MCV 100.5* 100.9* 99.6  PLT 162 141* 0000000*   Basic Metabolic Panel:  Recent Labs Lab 03/22/16 0030 03/23/16 1310 03/23/16 1311 03/24/16 0321  NA 139  --  135 137  K 3.3*  --  3.1* 3.2*  CL 97*  --  98* 105  CO2 16*  --  23 23  GLUCOSE 101*  --  159* 129*  BUN 16  --  17 13  CREATININE 1.08  --  0.92 0.72  CALCIUM 9.4  --  9.2 8.6*  MG  --  1.5*  --  1.9   GFR: Estimated Creatinine Clearance: 100 mL/min (by C-G formula based on Cr of 0.72). Liver Function Tests:  Recent Labs Lab 03/22/16 0030 03/23/16 1311 03/24/16 0321  AST 135* 66* 53*  ALT 65* 40 34  ALKPHOS 86 74 65  BILITOT 2.9* 2.8* 1.6*  PROT 6.8 5.8* 5.3*  ALBUMIN 3.8 3.3* 2.9*   Urine analysis:    Component Value Date/Time   COLORURINE ORANGE* 03/23/2016 1801   APPEARANCEUR CLOUDY* 03/23/2016 1801   LABSPEC 1.028 03/23/2016 1801   PHURINE 6.0 03/23/2016 1801   GLUCOSEU NEGATIVE 03/23/2016 1801   HGBUR NEGATIVE 03/23/2016 1801   BILIRUBINUR LARGE* 03/23/2016 1801   KETONESUR 40* 03/23/2016 1801   PROTEINUR 30* 03/23/2016 1801   UROBILINOGEN 2.0* 04/02/2015 2013   NITRITE POSITIVE* 03/23/2016 1801   LEUKOCYTESUR SMALL* 03/23/2016 1801   Sepsis Labs: Invalid input(s): PROCALCITONIN, LACTICIDVEN  Recent Results (from the past 240 hour(s))  MRSA PCR Screening     Status: None   Collection Time: 03/23/16  4:41 PM  Result Value Ref Range Status   MRSA by PCR NEGATIVE NEGATIVE Final    Comment:        The GeneXpert MRSA Assay (FDA approved for NASAL specimens only), is one component of a comprehensive MRSA colonization surveillance program. It is not intended to diagnose MRSA infection nor to guide or monitor treatment for MRSA infections.   C difficile quick scan w PCR reflex      Status: Abnormal   Collection Time: 03/24/16 12:40 AM  Result Value Ref Range Status   C Diff antigen POSITIVE (A) NEGATIVE Final   C Diff toxin NEGATIVE NEGATIVE Final   C Diff interpretation   Final    C. difficile present, but toxin not detected. This indicates colonization. In most cases, this does not require treatment. If patient has signs and symptoms consistent with colitis, consider treatment. Requires ENTERIC precautions.      Radiology Studies: Dg Ribs Bilateral W/chest  03/23/2016  CLINICAL DATA:  several falls recently. Was seen at [facility] Monday for same. Pt is an alcoholic. Requesting detox. Denies any etoh today. EMS reports he is hypotensive and tachycardic at 130. Hx prostate cancer. Hx  HTN. Hx CAD. Smoker. BB not placed for images because pt states generalized chest and rib pain on anterior right and left sides. EXAM: BILATERAL RIBS AND CHEST - 4+ VIEW COMPARISON:  02/16/2016 FINDINGS: No fracture or other bone lesions are seen involving the ribs. There is no evidence of pneumothorax or pleural effusion. Both lungs are clear. Heart size and mediastinal contours are within normal limits. Tortuous thoracic aorta. IMPRESSION: Negative. Electronically Signed   By: Lucrezia Europe M.D.   On: 03/23/2016 13:08     Scheduled Meds: . aspirin EC  81 mg Oral Daily  . atorvastatin  40 mg Oral q1800  . buPROPion  100 mg Oral BID  . carvedilol  3.125 mg Oral BID WC  . cholecalciferol  2,000 Units Oral Daily  . citalopram  20 mg Oral QHS  . enoxaparin (LOVENOX) injection  40 mg Subcutaneous Q24H  . folic acid  1 mg Oral Daily  . lactose free nutrition  237 mL Oral TID WC  . magnesium oxide  400 mg Oral QHS  . multivitamin with minerals  1 tablet Oral Daily  . omega-3 acid ethyl esters  1 g Oral Daily  . potassium chloride SA  20 mEq Oral Daily  . tamsulosin  0.4 mg Oral Daily  . thiamine  100 mg Oral Daily  . triamterene-hydrochlorothiazide  1 capsule Oral Daily   Continuous Infusions:      Marzetta Board, MD, PhD [facility] Pager [phone]  If 7PM-7AM, please contact night-coverage [email or web address] Password [facility] 03/24/2016, 12:53 PM

## 2016-03-24 NOTE — Progress Notes (Signed)
Initial Nutrition Assessment  DOCUMENTATION CODES:   Not applicable  INTERVENTION:  -Boost Plus TID, each supplement provides 350 calories, 14g protein -RD continue to monitor  NUTRITION DIAGNOSIS:   Inadequate oral intake related to inability to eat as evidenced by NPO status.  GOAL:   Patient will meet greater than or equal to 90% of their needs  MONITOR:   Labs, Vent status, I & O's, Skin, Diet advancement  REASON FOR ASSESSMENT:   Consult Assessment of nutrition requirement/status  ASSESSMENT:   Joshua Henson is a 71 yo male With a history of hypertension, tachycardia, alcoholic liver disease, that presented to the emergency department with complaints of generalized weakness and falls.  Spoke with Joshua Henson at bedside. He complains of poor appetite for 4-5 days PTA. "I didn't really eat much."  He denies weight loss.  Presents saying that "I fell on my face." Patient has bruising across his eyes and nose, all down his right side. He has quite the appetite now, tray in his was 100% consumed. Stated "your food is good." He had one bottle of boost so far, liked it, will continue to provide during stay.  Currently on contact and airborne precautions.  Clinically, he continues to have elevated LFTs, alcoholic liver dz 2/2 ETOH abuse. Did not appear to be suffering from DT's during visit.  Labs: K 3.2 Medications reviewed.  Diet Order:  Diet Heart Room service appropriate?: Yes; Fluid consistency:: Thin  Skin:  Wound (see comment) (Multiple ecchymosis)  Last BM:  4/6  Height:   Ht Readings from Last 1 Encounters:  03/23/16 5\' 10"  (1.778 m)    Weight:   Wt Readings from Last 1 Encounters:  03/24/16 199 lb 8.3 oz (90.5 kg)    Ideal Body Weight:  75.45 kg  BMI:  Body mass index is 28.63 kg/(m^2).  Estimated Nutritional Needs:   Kcal:  2000-2400 calories  Protein:  90-110 grams  Fluid:  1.2L  EDUCATION NEEDS:   No education needs identified at  this time  Joshua Anis. Samuel Mcpeek, MS, RD LDN After Hours/Weekend Pager [phone]

## 2016-03-24 NOTE — Progress Notes (Signed)
Received report from [place], Pt arrived unit, alert and oriented. Will continue with current plan of care.

## 2016-03-24 NOTE — Evaluation (Signed)
Occupational Therapy Evaluation Patient Details Name: Joshua Henson MRN: [id number] DOB: 10-07-1946 Today's Date: 03/24/2016    History of Present Illness 70 yo male admitted 03/23/16 With a history of hypertension, tachycardia, alcoholic liver disease, that presented to the emergency department with complaints of generalized weakness and falls.  wife stated the patient had brief period of stiffness and shaking this morning.    Clinical Impression   Pt was admitted for the above.  He will benefit from skilled OT to increase safety and independence with adls.  Pt was independent with adls prior to admission but he has had several recent falls. Goals in acute are for min guard assist to min A    Follow Up Recommendations  SNF;Supervision/Assistance - 24 hour    Equipment Recommendations  None recommended by OT    Recommendations for Other Services       Precautions / Restrictions Precautions Precautions: Fall Precaution Comments: pt was on bedrest:  OK to see per RN, but put him back to bed due to seizure precautions.  Watch HR, incontinent of BM/urine Restrictions Weight Bearing Restrictions: No      Mobility Bed Mobility Overal bed mobility: Needs Assistance Bed Mobility: Rolling Rolling: Independent   Supine to sit: Min guard Sit to supine: Min guard   General bed mobility comments: cues for safety; guarded  Transfers Overall transfer level: Needs assistance Equipment used: Rolling walker (2 wheeled) Transfers: Sit to/from Stand Sit to Stand: Min assist         General transfer comment: cues for safety, wide base    Balance Overall balance assessment: Needs assistance;History of Falls Sitting-balance support: Bilateral upper extremity supported Sitting balance-Leahy Scale: Fair     Standing balance support: During functional activity Standing balance-Leahy Scale: Poor                              ADL Overall ADL's : Needs assistance/impaired     Grooming: Set up;Bed level;Wash/dry hands                       Toileting- Clothing Manipulation and Hygiene: Total assistance;Bed level         General ADL Comments: Pt was incontinent x bowel; total A to clean up, but pt able to roll.  Pt states he has been incontinent since prostate CA.  He needs A with ADLs due to pain and he generally feels weak.  Has had several falls recently.  Pt states he has meniere's.  UB min A and LB max A     Vision     Perception     Praxis      Pertinent Vitals/Pain Pain Assessment: 0-10 Pain Score: 7  Pain Location: chest wall Pain Descriptors / Indicators: Grimacing;Discomfort;Guarding Pain Intervention(s): Limited activity within patient's tolerance;Monitored during session;Patient requesting pain meds-RN notified     Hand Dominance     Extremity/Trunk Assessment Upper Extremity Assessment Upper Extremity Assessment: Generalized weakness (MMT deferred due to chest wall/rib pain)   Lower Extremity Assessment Lower Extremity Assessment: Generalized weakness   Cervical / Trunk Assessment Cervical / Trunk Assessment: Normal   Communication Communication Communication: HOH (wears hearing aides)   Cognition Arousal/Alertness: Awake/alert Behavior During Therapy: WFL for tasks assessed/performed Overall Cognitive Status: Within Functional Limits for tasks assessed                     General Comments  Exercises       Shoulder Instructions      Home Living Family/patient expects to be discharged to:: Private residence Living Arrangements: Spouse/significant other Available Help at Discharge: Family;Available PRN/intermittently;Available 24 hours/day   Home Access: Stairs to enter Entrance Stairs-Number of Steps: 3   Home Layout: Two level (bed and bath upstairs)     Bathroom Shower/Tub: Occupational psychologist: Standard     Home Equipment: Environmental consultant - 2 wheels;Shower seat - built  in;Bedside commode;Grab bars - tub/shower          Prior Functioning/Environment Level of Independence: Independent             OT Diagnosis: Acute pain;Generalized weakness   OT Problem List: Decreased strength;Decreased activity tolerance;Impaired balance (sitting and/or standing);Decreased knowledge of use of DME or AE;Pain   OT Treatment/Interventions: Self-care/ADL training;DME and/or AE instruction;Patient/family education;Balance training;Energy conservation    OT Goals(Current goals can be found in the care plan section) Acute Rehab OT Goals Patient Stated Goal: to get stronger OT Goal Formulation: With patient Time For Goal Achievement: 04/07/16 Potential to Achieve Goals: Good ADL Goals Pt Will Perform Grooming: with min guard assist;standing Pt Will Perform Lower Body Bathing: with min guard assist;sit to/from stand Pt Will Perform Lower Body Dressing: with min assist;sit to/from stand Pt Will Transfer to Toilet: with min guard assist;bedside commode;ambulating Pt Will Perform Toileting - Clothing Manipulation and hygiene: with min guard assist;sit to/from stand Additional ADL Goal #1: pt will perform UB adls with set up sitting Additional ADL Goal #2: pt will initiate at least one rest break for energy conservation  OT Frequency: Min 2X/week   Barriers to D/C:            Co-evaluation   Reason for Co-Treatment: For patient/therapist safety PT goals addressed during session: Mobility/safety with mobility OT goals addressed during session: ADL's and self-care      End of Session    Activity Tolerance: Patient limited by pain Patient left: in bed;with call bell/phone within reach;with bed alarm set;with nursing/sitter in room   Time: 0757-0829 OT Time Calculation (min): 32 min Charges:  OT General Charges $OT Visit: 1 Procedure OT Evaluation $OT Eval Moderate Complexity: 1 Procedure G-Codes:    Darlene Brozowski 2016-03-25, 9:51 AM  Lesle Chris, OTR/L [phone] March 25, 2016

## 2016-03-25 LAB — C DIFFICILE QUICK SCREEN W PCR REFLEX
C Diff antigen: POSITIVE — AB
C Diff interpretation: POSITIVE
C Diff toxin: POSITIVE — AB

## 2016-03-25 LAB — COMPREHENSIVE METABOLIC PANEL
ALK PHOS: 76 U/L (ref 38–126)
ALT: 40 U/L (ref 17–63)
AST: 71 U/L — ABNORMAL HIGH (ref 15–41)
Albumin: 3.6 g/dL (ref 3.5–5.0)
Anion gap: 10 (ref 5–15)
BILIRUBIN TOTAL: 1.4 mg/dL — AB (ref 0.3–1.2)
BUN: 7 mg/dL (ref 6–20)
CALCIUM: 9.8 mg/dL (ref 8.9–10.3)
CO2: 29 mmol/L (ref 22–32)
CREATININE: 0.7 mg/dL (ref 0.61–1.24)
Chloride: 100 mmol/L — ABNORMAL LOW (ref 101–111)
Glucose, Bld: 114 mg/dL — ABNORMAL HIGH (ref 65–99)
Potassium: 3.8 mmol/L (ref 3.5–5.1)
Sodium: 139 mmol/L (ref 135–145)
Total Protein: 6.4 g/dL — ABNORMAL LOW (ref 6.5–8.1)

## 2016-03-25 LAB — CBC
HCT: 30.9 % — ABNORMAL LOW (ref 39.0–52.0)
Hemoglobin: 11.3 g/dL — ABNORMAL LOW (ref 13.0–17.0)
MCH: 37.8 pg — ABNORMAL HIGH (ref 26.0–34.0)
MCHC: 36.6 g/dL — ABNORMAL HIGH (ref 30.0–36.0)
MCV: 103.3 fL — ABNORMAL HIGH (ref 78.0–100.0)
PLATELETS: 167 10*3/uL (ref 150–400)
RBC: 2.99 MIL/uL — ABNORMAL LOW (ref 4.22–5.81)
RDW: 14.7 % (ref 11.5–15.5)
WBC: 4.7 10*3/uL (ref 4.0–10.5)

## 2016-03-25 MED ORDER — VANCOMYCIN 50 MG/ML ORAL SOLUTION
125.0000 mg | Freq: Four times a day (QID) | ORAL | Status: DC
  Administered 2016-03-26 – 2016-04-04 (×38): 125 mg via ORAL
  Filled 2016-03-25 (×47): qty 2.5

## 2016-03-25 NOTE — Progress Notes (Signed)
18 mg iv ativan adm from 1900-0600 for persistent ciwa 11-14.  Haldol 2.5 mg iv adm at 2230 with min/mod relief.  Waist restraint with mittens initiated at 2230.  Increased confusion/agitation, with intermittent periods of lucidity noted throughout shift. No sleep noted.  Safe environment maintained

## 2016-03-25 NOTE — NC FL2 (Signed)
[place] LEVEL OF CARE SCREENING TOOL     IDENTIFICATION  Patient Name: Joshua Henson Birthdate: 1946-03-30 Sex: male Admission Date (Current Location): 03/23/2016  [facility] and [place] Number:  Herbalist and Address:  [facility],  [place] [address] [place]      Provider Number: [id number]  Attending Physician Name and Address:  Caren Griffins, MD  Relative Name and Phone Number:       Current Level of Care: Hospital Recommended Level of Care: [place] Prior Approval Number:    Date Approved/Denied:   PASRR Number: [id number]  Discharge Plan: SNF    Current Diagnoses: Patient Active Problem List   Diagnosis Date Noted  . Essential hypertension 03/23/2016  . Fall   . Tobacco abuse   . Hypokalemia   . Hypomagnesemia   . Hypophosphatemia   . CAP (community acquired pneumonia) due to MSSA (methicillin sensitive Staphylococcus aureus) ([place])   . CAD in native artery   . Cocaine abuse   . Alcohol abuse with intoxication ([place]) 01/11/2016  . Frequent falls 01/11/2016  . Left rib fracture 01/11/2016  . Acute encephalopathy 01/11/2016  . Polysubstance abuse 01/11/2016  . Delirium tremens ([place]) 01/11/2016  . NSTEMI (non-ST elevated myocardial infarction) ([place]) 01/11/2016  . Sepsis due to Enterococcus with acute renal failure and metabolic encephalopathy ([place]) 01/11/2016  . Alcohol intoxication ([place])   . Chronic alcohol abuse   . Acute respiratory failure with hypoxia ([place])   . Elevated troponin 01/10/2016  . Chest pain 01/10/2016  . CAD (coronary artery disease), native coronary artery 01/10/2016  . Fatty liver, alcoholic [id number]  . Tremor 04/02/2015  . Ataxia   . Alcohol withdrawal ([place]) 09/03/2014  . Unspecified cerebral artery occlusion with cerebral infarction 07/05/2014  . Alcohol abuse 07/02/2014  . Current tobacco use 01/17/2014  . Hearing loss 11/25/2013  . Tinnitus of both ears 11/25/2013     Orientation RESPIRATION BLADDER Height & Weight     Self, Time, Situation, Place  Normal Incontinent Weight: 187 lb 2.7 oz (84.9 kg) Height:  5\' 10"  (177.8 cm)  BEHAVIORAL SYMPTOMS/MOOD NEUROLOGICAL BOWEL NUTRITION STATUS  Other (Comment) (confusion and agitation, pt can be uncooperative with care at times) Convulsions/Seizures (Possible seizure) Continent Diet (Diet Heart)  AMBULATORY STATUS COMMUNICATION OF NEEDS Skin   Limited Assist (+2 safety/equipment) Verbally Other (Comment) (right great toe laceration)                       Personal Care Assistance Level of Assistance  Bathing, Feeding, Dressing Bathing Assistance: Limited assistance Feeding assistance: Independent       Functional Limitations Info  Sight, Hearing, Speech Sight Info: Adequate Hearing Info: Impaired Speech Info: Adequate    SPECIAL CARE FACTORS FREQUENCY  PT (By licensed PT), OT (By licensed OT)     PT Frequency: 5 x a  week OT Frequency: 5 x a week            Contractures Contractures Info: Not present    Additional Factors Info  Code Status, Allergies, Isolation Precautions, Psychotropic Code Status Info: FULL code status Allergies Info: Lisinopril, celebrex Psychotropic Info: Wellbutrin, Celexa, Ativan   Isolation Precautions Info: Enteric Precautions     Current Medications (03/25/2016):  This is the current hospital active medication list Current Facility-Administered Medications  Medication Dose Route Frequency Provider Last Rate Last Dose  . acetaminophen (TYLENOL) tablet 650 mg  650 mg Oral Q6H  PRN Cristal Ford, DO       Or  . acetaminophen (TYLENOL) suppository 650 mg  650 mg Rectal Q6H PRN Maryann Mikhail, DO      . aspirin EC tablet 81 mg  81 mg Oral Daily Maryann Mikhail, DO   81 mg at 03/25/16 [id number]  . atorvastatin (LIPITOR) tablet 40 mg  40 mg Oral q1800 Maryann Mikhail, DO   40 mg at 03/24/16 1720  . buPROPion [facility]) tablet 100 mg  100 mg Oral BID Maryann  Mikhail, DO   100 mg at 03/25/16 [id number]  . carvedilol (COREG) tablet 3.125 mg  3.125 mg Oral BID WC Maryann Mikhail, DO   3.125 mg at 03/25/16 0919  . cholecalciferol (VITAMIN D) tablet 2,000 Units  2,000 Units Oral Daily Maryann Mikhail, DO   2,000 Units at 03/25/16 [phone]  . citalopram (CELEXA) tablet 20 mg  20 mg Oral QHS Maryann Mikhail, DO   20 mg at 03/24/16 2141  . enoxaparin (LOVENOX) injection 40 mg  40 mg Subcutaneous Q24H Maryann Mikhail, DO   40 mg at 03/24/16 1720  . folic acid (FOLVITE) tablet 1 mg  1 mg Oral Daily Maryann Mikhail, DO   1 mg at 03/25/16 [id number]  . HYDROcodone-acetaminophen (NORCO) 10-325 MG per tablet 1 tablet  1 tablet Oral BID PRN Cristal Ford, DO   1 tablet at 03/25/16 1303  . lactose free nutrition (BOOST PLUS) liquid 237 mL  237 mL Oral TID WC [place] Karlyne Greenspan, MD   237 mL at 03/24/16 1720  . LORazepam (ATIVAN) injection 2-3 mg  2-3 mg Intravenous Q1H PRN Maryann Mikhail, DO   2 mg at 03/25/16 1529  . magnesium oxide (MAG-OX) tablet 400 mg  400 mg Oral QHS Maryann Mikhail, DO   400 mg at 03/24/16 2141  . morphine 2 MG/ML injection 2 mg  2 mg Intravenous Q4H PRN Maryann Mikhail, DO   2 mg at 03/24/16 2116  . multivitamin with minerals tablet 1 tablet  1 tablet Oral Daily Maryann Mikhail, DO   1 tablet at 03/25/16 [phone]  . omega-3 acid ethyl esters (LOVAZA) capsule 1 g  1 g Oral Daily Maryann Mikhail, DO   1 g at 03/25/16 [id number]  . ondansetron (ZOFRAN) tablet 4 mg  4 mg Oral Q6H PRN Maryann Mikhail, DO       Or  . ondansetron (ZOFRAN) injection 4 mg  4 mg Intravenous Q6H PRN Maryann Mikhail, DO      . potassium chloride SA (K-DUR,KLOR-CON) CR tablet 20 mEq  20 mEq Oral Daily Maryann Mikhail, DO   20 mEq at 03/25/16 [id number]  . tamsulosin (FLOMAX) capsule 0.4 mg  0.4 mg Oral Daily Maryann Mikhail, DO   0.4 mg at 03/25/16 0918  . thiamine (VITAMIN B-1) tablet 100 mg  100 mg Oral Daily Maryann Mikhail, DO   100 mg at 03/25/16 [id number]  . triamterene-hydrochlorothiazide (DYAZIDE)  37.5-25 MG per capsule 1 capsule  1 capsule Oral Daily Maryann Mikhail, DO   1 capsule at 03/25/16 [id number]     Discharge Medications: Please see discharge summary for a list of discharge medications.  Relevant Imaging Results:  Relevant Lab Results:   Additional Information SSN: [id number]  Ivie Maese A, LCSW

## 2016-03-25 NOTE — Progress Notes (Signed)
PROGRESS NOTE  Joshua Henson [id number] DOB: 1946-09-23 DOA: 03/23/2016 PCP: Mayra Neer, MD Outpatient Specialists:    LOS: 2 days   Brief Narrative: 70 y.o. male with a history of hypertension, tachycardia, alcoholic liver disease, that presented to the emergency department with complaints of generalized weakness and falls. Patient was recently seen in the emergency department on 03/23/2015 after sustaining several falls. Patient was not admitted at that time. Last drink per pt 4/4. He does state that he has been having pain with deep breathing and movement as well as cough. Per reports from [place], wife stated the patient had brief period of stiffness and shaking the morning of admission on 4/5  Assessment & Plan: Active Problems:   Alcohol abuse   Alcohol withdrawal (HCC)   Fatty liver, alcoholic   Frequent falls   Hypokalemia   Hypomagnesemia   Essential hypertension   Alcohol withdrawal - Blood alcohol level <5 - Patient continues to treat her CIWA, appears somewhat confused overnight as well as this morning, agitated and using foul language, mittens on  Possible seizure - Reported by the wife to EDP - EEG pending - Placed on seizure precautions - Likely in the setting of alcohol withdrawal, unlikely he will need any AED  Essential hypertension - Continue Dyazide, Coreg  Elevated LFTs/fatty liver - Secondary to alcohol abuse - LFTs currently stable, continue to monitor CMP - AST and bilirubin improving  Tachycardia - Continue Coreg - Patient does have history of tachycardia, possibly worsened by alcohol withdrawal  Hypomagnesemia/hypokalemia - Patient does take magnesium and potassium supplements at home - Will replace IV and continue to monitor - Nutrition consulted  Frequent falls/physical deconditioning - PT and OT consulted - Patient presented to the emergency department on 03/22/2016 and had x-ray of the ribs and chest which was negative, CT of the  head/maxillofacial which showed no acute intracranial findings, CT of the cervical spine which was negative for acute fractures  Depression/Anxiety - Continue bupropion, Celexa - Home ativan and Xanax held - Placed on CIWA protocol   DVT prophylaxis: Lovenox Code Status: Full code Family Communication: No family at bedside Disposition Plan: Home when ready, PT evaluation pending Barriers for discharge: Alcohol withdrawal  Consultants:   None  Procedures:   None   Antimicrobials:  None    Subjective: - confused this morning, he thinks he is at home on his couch and is asking to get him to bed. Appears agitated and using foul language   Objective: Filed Vitals:   03/24/16 2319 03/25/16 0150 03/25/16 0549 03/25/16 1029  BP: 152/90 156/86 149/90 142/86  Pulse: 93 80 74 70  Temp: 97.6 F (36.4 C) 98.3 F (36.8 C) 98 F (36.7 C) 97.3 F (36.3 C)  TempSrc: Oral  Oral Oral  Resp:  18 18 22   Height:      Weight:      SpO2: 94% 94% 94% 93%   No intake or output data in the 24 hours ending 03/25/16 1205 Filed Weights   03/23/16 1650 03/24/16 0257 03/24/16 1930  Weight: 87.9 kg (193 lb 12.6 oz) 90.5 kg (199 lb 8.3 oz) 84.9 kg (187 lb 2.7 oz)    Examination: Constitutional: Mildly tremulous Filed Vitals:   03/24/16 2319 03/25/16 0150 03/25/16 0549 03/25/16 1029  BP: 152/90 156/86 149/90 142/86  Pulse: 93 80 74 70  Temp: 97.6 F (36.4 C) 98.3 F (36.8 C) 98 F (36.7 C) 97.3 F (36.3 C)  TempSrc: Oral  Oral Oral  Resp:  18 18 22   Height:      Weight:      SpO2: 94% 94% 94% 93%   Eyes: PERRL, periorbital ecchymoses  ENMT: Mucous membranes are moist.  Respiratory: clear to auscultation bilaterally, no wheezing, no crackles. Normal respiratory effort. No accessory muscle use.  Cardiovascular: Regular rate and rhythm, no murmurs / rubs / gallops. No extremity edema. 2+ pedal pulses. Abdomen: no tenderness, no masses palpated. Bowel sounds positive.    Musculoskeletal: no clubbing / cyanosis. No joint deformity upper and lower extremities. Normal muscle tone. Neurologic: Nonfocal Psychiatric: Appears anxious   Data Reviewed: I have personally reviewed following labs and imaging studies  CBC:  Recent Labs Lab 03/22/16 0030 03/23/16 1311 03/24/16 0321 03/25/16 0523  WBC 7.3 7.4 4.9 4.7  NEUTROABS 5.2 5.9  --   --   HGB 13.5 11.8* 9.4* 11.3*  HCT 37.5* 32.1* 26.5* 30.9*  MCV 100.5* 100.9* 99.6 103.3*  PLT 162 141* 138* [id number]   Basic Metabolic Panel:  Recent Labs Lab 03/22/16 0030 03/23/16 1310 03/23/16 1311 03/24/16 0321 03/25/16 0523  NA 139  --  135 137 139  K 3.3*  --  3.1* 3.2* 3.8  CL 97*  --  98* 105 100*  CO2 16*  --  23 23 29   GLUCOSE 101*  --  159* 129* 114*  BUN 16  --  17 13 7   CREATININE 1.08  --  0.92 0.72 0.70  CALCIUM 9.4  --  9.2 8.6* 9.8  MG  --  1.5*  --  1.9  --    GFR: Estimated Creatinine Clearance: 91.3 mL/min (by C-G formula based on Cr of 0.7). Liver Function Tests:  Recent Labs Lab 03/22/16 0030 03/23/16 1311 03/24/16 0321 03/25/16 0523  AST 135* 66* 53* 71*  ALT 65* 40 34 40  ALKPHOS 86 74 65 76  BILITOT 2.9* 2.8* 1.6* 1.4*  PROT 6.8 5.8* 5.3* 6.4*  ALBUMIN 3.8 3.3* 2.9* 3.6   Urine analysis:    Component Value Date/Time   COLORURINE ORANGE* 03/23/2016 1801   APPEARANCEUR CLOUDY* 03/23/2016 1801   LABSPEC 1.028 03/23/2016 1801   PHURINE 6.0 03/23/2016 1801   GLUCOSEU NEGATIVE 03/23/2016 1801   HGBUR NEGATIVE 03/23/2016 1801   BILIRUBINUR LARGE* 03/23/2016 1801   KETONESUR 40* 03/23/2016 1801   PROTEINUR 30* 03/23/2016 1801   UROBILINOGEN 2.0* 04/02/2015 2013   NITRITE POSITIVE* 03/23/2016 1801   LEUKOCYTESUR SMALL* 03/23/2016 1801   Sepsis Labs: Invalid input(s): PROCALCITONIN, LACTICIDVEN  Recent Results (from the past 240 hour(s))  MRSA PCR Screening     Status: None   Collection Time: 03/23/16  4:41 PM  Result Value Ref Range Status   MRSA by PCR NEGATIVE  NEGATIVE Final    Comment:        The GeneXpert MRSA Assay (FDA approved for NASAL specimens only), is one component of a comprehensive MRSA colonization surveillance program. It is not intended to diagnose MRSA infection nor to guide or monitor treatment for MRSA infections.   C difficile quick scan w PCR reflex     Status: Abnormal   Collection Time: 03/24/16 12:40 AM  Result Value Ref Range Status   C Diff antigen POSITIVE (A) NEGATIVE Final   C Diff toxin NEGATIVE NEGATIVE Final   C Diff interpretation   Final    C. difficile present, but toxin not detected. This indicates colonization. In most cases, this does not require treatment. If patient has signs and symptoms consistent  with colitis, consider treatment. Requires ENTERIC precautions.    Radiology Studies: Dg Ribs Bilateral W/chest  03/23/2016  CLINICAL DATA:  several falls recently. Was seen at [facility] Monday for same. Pt is an alcoholic. Requesting detox. Denies any etoh today. EMS reports he is hypotensive and tachycardic at 130. Hx prostate cancer. Hx HTN. Hx CAD. Smoker. BB not placed for images because pt states generalized chest and rib pain on anterior right and left sides. EXAM: BILATERAL RIBS AND CHEST - 4+ VIEW COMPARISON:  02/16/2016 FINDINGS: No fracture or other bone lesions are seen involving the ribs. There is no evidence of pneumothorax or pleural effusion. Both lungs are clear. Heart size and mediastinal contours are within normal limits. Tortuous thoracic aorta. IMPRESSION: Negative. Electronically Signed   By: Lucrezia Europe M.D.   On: 03/23/2016 13:08   Scheduled Meds: . aspirin EC  81 mg Oral Daily  . atorvastatin  40 mg Oral q1800  . buPROPion  100 mg Oral BID  . carvedilol  3.125 mg Oral BID WC  . cholecalciferol  2,000 Units Oral Daily  . citalopram  20 mg Oral QHS  . enoxaparin (LOVENOX) injection  40 mg Subcutaneous Q24H  . folic acid  1 mg Oral Daily  . lactose free nutrition  237 mL Oral TID WC  .  magnesium oxide  400 mg Oral QHS  . multivitamin with minerals  1 tablet Oral Daily  . omega-3 acid ethyl esters  1 g Oral Daily  . potassium chloride SA  20 mEq Oral Daily  . tamsulosin  0.4 mg Oral Daily  . thiamine  100 mg Oral Daily  . triamterene-hydrochlorothiazide  1 capsule Oral Daily   Continuous Infusions:   Marzetta Board, MD, PhD [facility] Pager [phone]  If 7PM-7AM, please contact night-coverage [email or web address] Password [email or web address] 03/25/2016, 12:05 PM

## 2016-03-26 MED ORDER — TRIAMTERENE-HCTZ 37.5-25 MG PO TABS
1.0000 | ORAL_TABLET | Freq: Every day | ORAL | Status: DC
  Administered 2016-03-26 – 2016-03-30 (×5): 1 via ORAL
  Filled 2016-03-26 (×5): qty 1

## 2016-03-26 MED ORDER — CIPROFLOXACIN HCL 0.3 % OP SOLN
1.0000 [drp] | OPHTHALMIC | Status: DC
  Administered 2016-03-26 – 2016-04-04 (×43): 1 [drp] via OPHTHALMIC
  Filled 2016-03-26 (×2): qty 2.5

## 2016-03-26 NOTE — Progress Notes (Signed)
CSW declined to speak with CSW today, " I didn't get any sleep last night. Come back later." PT notes indicate pt will accept SNF placement, if needed. CSW will initiate SNF search and provide bed offers when available.  Werner Lean LCSW

## 2016-03-26 NOTE — Progress Notes (Signed)
PROGRESS NOTE  Joshua Henson [id number] DOB: 10-02-1946 DOA: 03/23/2016 PCP: Mayra Neer, MD Outpatient Specialists:    LOS: 3 days   Brief Narrative: 70 y.o. male with a history of hypertension, tachycardia, alcoholic liver disease, that presented to the emergency department with complaints of generalized weakness and falls. Patient was recently seen in the emergency department on 03/23/2015 after sustaining several falls. Patient was not admitted at that time. Last drink per pt 4/4. He does state that he has been having pain with deep breathing and movement as well as cough. Per reports from [place], wife stated the patient had brief period of stiffness and shaking the morning of admission on 4/5  Assessment & Plan: Active Problems:   Alcohol abuse   Alcohol withdrawal (HCC)   Fatty liver, alcoholic   Frequent falls   Hypokalemia   Hypomagnesemia   Essential hypertension   Alcohol withdrawal - Blood alcohol level <5 - Patient continues to trigger CIWA, appears somewhat confused overnight as well as this morning, but overall improved  Possible seizure - Reported by the wife to EDP - EEG pending still - Placed on seizure precautions - Likely in the setting of alcohol withdrawal, unlikely he will need any AED  Essential hypertension - Continue Dyazide, Coreg  Elevated LFTs/fatty liver - Secondary to alcohol abuse - LFTs currently stable, continue to monitor CMP - AST and bilirubin improving  Tachycardia - Continue Coreg - Patient does have history of tachycardia, possibly worsened by alcohol withdrawal  Hypomagnesemia/hypokalemia - Patient does take magnesium and potassium supplements at home - improved  Frequent falls/physical deconditioning - PT and OT consulted, SNF recommended, SW consulted - Patient presented to the emergency department on 03/22/2016 and had x-ray of the ribs and chest which was negative, CT of the head/maxillofacial which showed no acute  intracranial findings, CT of the cervical spine which was negative for acute fractures  Depression/Anxiety - Continue bupropion, Celexa - Home ativan and Xanax held - Placed on CIWA protocol  Eye discharge - with bilateral crusting, local care, cipro drops   DVT prophylaxis: Lovenox Code Status: Full code Family Communication: No family at bedside, d/w wife over the phone Disposition Plan: Home when ready, PT evaluation pending Barriers for discharge: Alcohol withdrawal  Consultants:   None  Procedures:   None   Antimicrobials:  None    Subjective: - mild confusion, but feels "better than the past 2 days"  Objective: Filed Vitals:   03/25/16 1029 03/25/16 1414 03/25/16 2101 03/26/16 0414  BP: 142/86 131/78 134/74 144/88  Pulse: 70 78 94 78  Temp: 97.3 F (36.3 C) 97.5 F (36.4 C) 97.5 F (36.4 C) 97.4 F (36.3 C)  TempSrc: Oral Oral Axillary Axillary  Resp: 22 20 23 20   Height:      Weight:      SpO2: 93% 96% 96% 96%    Intake/Output Summary (Last 24 hours) at 03/26/16 1456 Last data filed at 03/26/16 0801  Gross per 24 hour  Intake    110 ml  Output   1250 ml  Net  -1140 ml   Filed Weights   03/23/16 1650 03/24/16 0257 03/24/16 1930  Weight: 87.9 kg (193 lb 12.6 oz) 90.5 kg (199 lb 8.3 oz) 84.9 kg (187 lb 2.7 oz)    Examination: Constitutional: Mildly tremulous Filed Vitals:   03/25/16 1029 03/25/16 1414 03/25/16 2101 03/26/16 0414  BP: 142/86 131/78 134/74 144/88  Pulse: 70 78 94 78  Temp: 97.3 F (36.3 C) 97.5  F (36.4 C) 97.5 F (36.4 C) 97.4 F (36.3 C)  TempSrc: Oral Oral Axillary Axillary  Resp: 22 20 23 20   Height:      Weight:      SpO2: 93% 96% 96% 96%  Eyes: PERRL, periorbital ecchymoses  ENMT: Mucous membranes are moist.  Respiratory: clear to auscultation bilaterally, no wheezing, no crackles.  Cardiovascular: Regular rate and rhythm, no murmurs / rubs / gallops. Abdomen: no tenderness Bowel sounds positive.    Musculoskeletal: no clubbing / cyanosis. No joint deformity upper and lower extremities. Normal muscle tone. Neurologic: Nonfocal Psychiatric: drowsy   Data Reviewed: I have personally reviewed following labs and imaging studies  CBC:  Recent Labs Lab 03/22/16 0030 03/23/16 1311 03/24/16 0321 03/25/16 0523  WBC 7.3 7.4 4.9 4.7  NEUTROABS 5.2 5.9  --   --   HGB 13.5 11.8* 9.4* 11.3*  HCT 37.5* 32.1* 26.5* 30.9*  MCV 100.5* 100.9* 99.6 103.3*  PLT 162 141* 138* [id number]   Basic Metabolic Panel:  Recent Labs Lab 03/22/16 0030 03/23/16 1310 03/23/16 1311 03/24/16 0321 03/25/16 0523  NA 139  --  135 137 139  K 3.3*  --  3.1* 3.2* 3.8  CL 97*  --  98* 105 100*  CO2 16*  --  23 23 29   GLUCOSE 101*  --  159* 129* 114*  BUN 16  --  17 13 7   CREATININE 1.08  --  0.92 0.72 0.70  CALCIUM 9.4  --  9.2 8.6* 9.8  MG  --  1.5*  --  1.9  --    GFR: Estimated Creatinine Clearance: 91.3 mL/min (by C-G formula based on Cr of 0.7). Liver Function Tests:  Recent Labs Lab 03/22/16 0030 03/23/16 1311 03/24/16 0321 03/25/16 0523  AST 135* 66* 53* 71*  ALT 65* 40 34 40  ALKPHOS 86 74 65 76  BILITOT 2.9* 2.8* 1.6* 1.4*  PROT 6.8 5.8* 5.3* 6.4*  ALBUMIN 3.8 3.3* 2.9* 3.6   Urine analysis:    Component Value Date/Time   COLORURINE ORANGE* 03/23/2016 1801   APPEARANCEUR CLOUDY* 03/23/2016 1801   LABSPEC 1.028 03/23/2016 1801   PHURINE 6.0 03/23/2016 1801   GLUCOSEU NEGATIVE 03/23/2016 1801   HGBUR NEGATIVE 03/23/2016 1801   BILIRUBINUR LARGE* 03/23/2016 1801   KETONESUR 40* 03/23/2016 1801   PROTEINUR 30* 03/23/2016 1801   UROBILINOGEN 2.0* 04/02/2015 2013   NITRITE POSITIVE* 03/23/2016 1801   LEUKOCYTESUR SMALL* 03/23/2016 1801   Sepsis Labs: Invalid input(s): PROCALCITONIN, LACTICIDVEN  Recent Results (from the past 240 hour(s))  MRSA PCR Screening     Status: None   Collection Time: 03/23/16  4:41 PM  Result Value Ref Range Status   MRSA by PCR NEGATIVE NEGATIVE  Final    Comment:        The GeneXpert MRSA Assay (FDA approved for NASAL specimens only), is one component of a comprehensive MRSA colonization surveillance program. It is not intended to diagnose MRSA infection nor to guide or monitor treatment for MRSA infections.   C difficile quick scan w PCR reflex     Status: Abnormal   Collection Time: 03/24/16 12:40 AM  Result Value Ref Range Status   C Diff antigen POSITIVE (A) NEGATIVE Final   C Diff toxin POSITIVE (A) NEGATIVE Corrected    Comment: CALLED TO DENNIS RN AT 1227 ON 4.7.17 BY EPPERSON,S CORRECTED ON 04/07 AT 1225: PREVIOUSLY REPORTED AS NEGATIVE    C Diff interpretation Positive for toxigenic C. difficile  Corrected    Comment: CRITICAL RESULT CALLED TO, READ BACK BY AND VERIFIED WITH: DENNIS, RN AT 1227 ON 4.7.17 BY EPPERSON,S CORRECTED ON 04/07 AT 1446: PREVIOUSLY REPORTED AS C. difficile present, but toxin not detected. This indicates colonization. In most cases, this does not require treatment. If patient has signs and symptoms consistent with colitis, consider treatment.  Requires ENTERIC precautions.     Radiology Studies: No results found. Scheduled Meds: . aspirin EC  81 mg Oral Daily  . atorvastatin  40 mg Oral q1800  . buPROPion  100 mg Oral BID  . carvedilol  3.125 mg Oral BID WC  . cholecalciferol  2,000 Units Oral Daily  . ciprofloxacin  1 drop Both Eyes Q4H while awake  . citalopram  20 mg Oral QHS  . enoxaparin (LOVENOX) injection  40 mg Subcutaneous Q24H  . folic acid  1 mg Oral Daily  . lactose free nutrition  237 mL Oral TID WC  . magnesium oxide  400 mg Oral QHS  . multivitamin with minerals  1 tablet Oral Daily  . omega-3 acid ethyl esters  1 g Oral Daily  . potassium chloride SA  20 mEq Oral Daily  . tamsulosin  0.4 mg Oral Daily  . thiamine  100 mg Oral Daily  . triamterene-hydrochlorothiazide  1 tablet Oral Daily  . vancomycin  125 mg Oral 4 times per day   Continuous Infusions:    Marzetta Board, MD, PhD [facility] Pager [phone]  If 7PM-7AM, please contact night-coverage [email or web address] Password [email or web address] 03/26/2016, 2:56 PM

## 2016-03-27 LAB — AMMONIA: Ammonia: 25 umol/L (ref 9–35)

## 2016-03-27 NOTE — Progress Notes (Signed)
PROGRESS NOTE  Joshua Henson [id number] DOB: 1946-01-16 DOA: 03/23/2016 PCP: Mayra Neer, MD Outpatient Specialists:    LOS: 4 days   Brief Narrative: 70 y.o. male with a history of hypertension, tachycardia, alcoholic liver disease, that presented to the emergency department with complaints of generalized weakness and falls. Patient was recently seen in the emergency department on 03/23/2015 after sustaining several falls. Patient was not admitted at that time. Last drink per pt 4/4. He does state that he has been having pain with deep breathing and movement as well as cough. Per reports from [place], wife stated the patient had brief period of stiffness and shaking the morning of admission on 4/5  Assessment & Plan: Active Problems:   Alcohol abuse   Alcohol withdrawal (HCC)   Fatty liver, alcoholic   Frequent falls   Hypokalemia   Hypomagnesemia   Essential hypertension   Alcohol withdrawal - Blood alcohol level <5 - Patient continues to trigger CIWA, continues to be confused. Per wife, his withdrawal lasts ~5 days   Possible seizure - Reported by the wife to [place] - EEG pending still - Placed on seizure precautions - Likely in the setting of alcohol withdrawal, unlikely he will need any AED  Essential hypertension - Continue Dyazide, Coreg  Elevated LFTs/fatty liver - Secondary to alcohol abuse - LFTs currently stable, continue to monitor CMP - AST and bilirubin improving - check ammonia given ongoing confusion  Tachycardia - Continue Coreg - Patient does have history of tachycardia, possibly worsened by alcohol withdrawal  Hypomagnesemia/hypokalemia - Patient does take magnesium and potassium supplements at home - improved  Frequent falls/physical deconditioning - PT and OT consulted, SNF recommended, SW consulted - Patient presented to the emergency department on 03/22/2016 and had x-ray of the ribs and chest which was negative, CT of the head/maxillofacial  which showed no acute intracranial findings, CT of the cervical spine which was negative for acute fractures  Depression/Anxiety - Continue bupropion, Celexa - Home ativan and Xanax held - Placed on CIWA protocol  Eye discharge - with bilateral crusting, local care, cipro drops   DVT prophylaxis: Lovenox Code Status: Full code Family Communication: No family at bedside, d/w wife over the phone Disposition Plan: Home when ready, PT evaluation pending Barriers for discharge: Alcohol withdrawal  Consultants:   None  Procedures:   None   Antimicrobials:  None    Subjective: - mild confusion, but feels "better than the past 2 days"  Objective: Filed Vitals:   03/26/16 1500 03/26/16 2126 03/27/16 0034 03/27/16 0656  BP: 133/76 132/88 162/100 173/100  Pulse: 94 70 71 93  Temp: 97.8 F (36.6 C) 97.7 F (36.5 C)  97.6 F (36.4 C)  TempSrc: Axillary   Axillary  Resp: 20 20  18   Height:      Weight:      SpO2: 97% 95%  96%   No intake or output data in the 24 hours ending 03/27/16 1200 Filed Weights   03/23/16 1650 03/24/16 0257 03/24/16 1930  Weight: 87.9 kg (193 lb 12.6 oz) 90.5 kg (199 lb 8.3 oz) 84.9 kg (187 lb 2.7 oz)    Examination: Constitutional: Mildly tremulous Filed Vitals:   03/26/16 1500 03/26/16 2126 03/27/16 0034 03/27/16 0656  BP: 133/76 132/88 162/100 173/100  Pulse: 94 70 71 93  Temp: 97.8 F (36.6 C) 97.7 F (36.5 C)  97.6 F (36.4 C)  TempSrc: Axillary   Axillary  Resp: 20 20  18   Height:  Weight:      SpO2: 97% 95%  96%  Eyes: PERRL, periorbital ecchymoses  ENMT: Mucous membranes are moist.  Respiratory: clear to auscultation bilaterally, no wheezing, no crackles.  Cardiovascular: Regular rate and rhythm, no murmurs / rubs / gallops. Abdomen: no tenderness Bowel sounds positive.  Musculoskeletal: no clubbing / cyanosis. No joint deformity upper and lower extremities. Normal muscle tone. Neurologic: Nonfocal Psychiatric:  drowsy   Data Reviewed: I have personally reviewed following labs and imaging studies  CBC:  Recent Labs Lab 03/22/16 0030 03/23/16 1311 03/24/16 0321 03/25/16 0523  WBC 7.3 7.4 4.9 4.7  NEUTROABS 5.2 5.9  --   --   HGB 13.5 11.8* 9.4* 11.3*  HCT 37.5* 32.1* 26.5* 30.9*  MCV 100.5* 100.9* 99.6 103.3*  PLT 162 141* 138* [id number]   Basic Metabolic Panel:  Recent Labs Lab 03/22/16 0030 03/23/16 1310 03/23/16 1311 03/24/16 0321 03/25/16 0523  NA 139  --  135 137 139  K 3.3*  --  3.1* 3.2* 3.8  CL 97*  --  98* 105 100*  CO2 16*  --  23 23 29   GLUCOSE 101*  --  159* 129* 114*  BUN 16  --  17 13 7   CREATININE 1.08  --  0.92 0.72 0.70  CALCIUM 9.4  --  9.2 8.6* 9.8  MG  --  1.5*  --  1.9  --    GFR: Estimated Creatinine Clearance: 91.3 mL/min (by C-G formula based on Cr of 0.7). Liver Function Tests:  Recent Labs Lab 03/22/16 0030 03/23/16 1311 03/24/16 0321 03/25/16 0523  AST 135* 66* 53* 71*  ALT 65* 40 34 40  ALKPHOS 86 74 65 76  BILITOT 2.9* 2.8* 1.6* 1.4*  PROT 6.8 5.8* 5.3* 6.4*  ALBUMIN 3.8 3.3* 2.9* 3.6   Urine analysis:    Component Value Date/Time   COLORURINE ORANGE* 03/23/2016 1801   APPEARANCEUR CLOUDY* 03/23/2016 1801   LABSPEC 1.028 03/23/2016 1801   PHURINE 6.0 03/23/2016 1801   GLUCOSEU NEGATIVE 03/23/2016 1801   HGBUR NEGATIVE 03/23/2016 1801   BILIRUBINUR LARGE* 03/23/2016 1801   KETONESUR 40* 03/23/2016 1801   PROTEINUR 30* 03/23/2016 1801   UROBILINOGEN 2.0* 04/02/2015 2013   NITRITE POSITIVE* 03/23/2016 1801   LEUKOCYTESUR SMALL* 03/23/2016 1801   Sepsis Labs: Invalid input(s): PROCALCITONIN, LACTICIDVEN  Recent Results (from the past 240 hour(s))  MRSA PCR Screening     Status: None   Collection Time: 03/23/16  4:41 PM  Result Value Ref Range Status   MRSA by PCR NEGATIVE NEGATIVE Final    Comment:        The GeneXpert MRSA Assay (FDA approved for NASAL specimens only), is one component of a comprehensive MRSA  colonization surveillance program. It is not intended to diagnose MRSA infection nor to guide or monitor treatment for MRSA infections.   C difficile quick scan w PCR reflex     Status: Abnormal   Collection Time: 03/24/16 12:40 AM  Result Value Ref Range Status   C Diff antigen POSITIVE (A) NEGATIVE Final   C Diff toxin POSITIVE (A) NEGATIVE Corrected    Comment: CALLED TO DENNIS RN AT 1227 ON 4.7.17 BY EPPERSON,S CORRECTED ON 04/07 AT 1225: PREVIOUSLY REPORTED AS NEGATIVE    C Diff interpretation Positive for toxigenic C. difficile  Corrected    Comment: CRITICAL RESULT CALLED TO, READ BACK BY AND VERIFIED WITH: DENNIS, RN AT 1227 ON 4.7.17 BY EPPERSON,S CORRECTED ON 04/07 AT 1446: PREVIOUSLY REPORTED AS  C. difficile present, but toxin not detected. This indicates colonization. In most cases, this does not require treatment. If patient has signs and symptoms consistent with colitis, consider treatment.  Requires ENTERIC precautions.     Radiology Studies: No results found. Scheduled Meds: . aspirin EC  81 mg Oral Daily  . atorvastatin  40 mg Oral q1800  . buPROPion  100 mg Oral BID  . carvedilol  3.125 mg Oral BID WC  . cholecalciferol  2,000 Units Oral Daily  . ciprofloxacin  1 drop Both Eyes Q4H while awake  . citalopram  20 mg Oral QHS  . enoxaparin (LOVENOX) injection  40 mg Subcutaneous Q24H  . folic acid  1 mg Oral Daily  . lactose free nutrition  237 mL Oral TID WC  . magnesium oxide  400 mg Oral QHS  . multivitamin with minerals  1 tablet Oral Daily  . omega-3 acid ethyl esters  1 g Oral Daily  . potassium chloride SA  20 mEq Oral Daily  . tamsulosin  0.4 mg Oral Daily  . thiamine  100 mg Oral Daily  . triamterene-hydrochlorothiazide  1 tablet Oral Daily  . vancomycin  125 mg Oral 4 times per day   Continuous Infusions:   Marzetta Board, MD, PhD [facility] Pager [phone]  If 7PM-7AM, please contact night-coverage [email or web address] Password  [email or web address] 03/27/2016, 12:00 PM

## 2016-03-28 LAB — COMPREHENSIVE METABOLIC PANEL
ALK PHOS: 86 U/L (ref 38–126)
ALT: 61 U/L (ref 17–63)
ANION GAP: 12 (ref 5–15)
AST: 78 U/L — ABNORMAL HIGH (ref 15–41)
Albumin: 3.7 g/dL (ref 3.5–5.0)
BUN: 19 mg/dL (ref 6–20)
CALCIUM: 10.1 mg/dL (ref 8.9–10.3)
CO2: 22 mmol/L (ref 22–32)
CREATININE: 0.94 mg/dL (ref 0.61–1.24)
Chloride: 101 mmol/L (ref 101–111)
Glucose, Bld: 121 mg/dL — ABNORMAL HIGH (ref 65–99)
Potassium: 3.8 mmol/L (ref 3.5–5.1)
Sodium: 135 mmol/L (ref 135–145)
Total Bilirubin: 1.4 mg/dL — ABNORMAL HIGH (ref 0.3–1.2)
Total Protein: 7.2 g/dL (ref 6.5–8.1)

## 2016-03-28 LAB — CBC
HCT: 37.4 % — ABNORMAL LOW (ref 39.0–52.0)
HEMOGLOBIN: 13.4 g/dL (ref 13.0–17.0)
MCH: 36.6 pg — ABNORMAL HIGH (ref 26.0–34.0)
MCHC: 35.8 g/dL (ref 30.0–36.0)
MCV: 102.2 fL — ABNORMAL HIGH (ref 78.0–100.0)
PLATELETS: 208 10*3/uL (ref 150–400)
RBC: 3.66 MIL/uL — AB (ref 4.22–5.81)
RDW: 14.8 % (ref 11.5–15.5)
WBC: 7.9 10*3/uL (ref 4.0–10.5)

## 2016-03-28 LAB — PROTIME-INR
INR: 1.11 (ref 0.00–1.49)
PROTHROMBIN TIME: 14 s (ref 11.6–15.2)

## 2016-03-28 NOTE — Care Management Important Message (Signed)
Important Message  Patient Details IM Letter given to [name]/Case Manager to present to Patient Name: Joshua Henson MRN: [id number] Date of Birth: 06-19-1946   Medicare Important Message Given:  Yes    Camillo Flaming 03/28/2016, 11:13 AMImportant Message  Patient Details  Name: Joshua Henson MRN: [id number] Date of Birth: 08-05-1946   Medicare Important Message Given:  Yes    Camillo Flaming 03/28/2016, 11:13 AM

## 2016-03-28 NOTE — Progress Notes (Signed)
PROGRESS NOTE  Joshua Henson [id number] DOB: 03-15-1946 DOA: 03/23/2016 PCP: Joshua Neer, MD Outpatient Specialists:    LOS: 5 days   Brief Narrative: 70 y.o. male with a history of hypertension, tachycardia, alcoholic liver disease, that presented to the emergency department with complaints of generalized weakness and falls. Patient was recently seen in the emergency department on 03/23/2015 after sustaining several falls. Patient was not admitted at that time. Last drink per pt 4/4. He does state that he has been having pain with deep breathing and movement as well as cough. Per reports from [place], wife stated the patient had brief period of stiffness and shaking the morning of admission on 4/5  Assessment & Plan: Active Problems:   Alcohol abuse   Alcohol withdrawal (HCC)   Fatty liver, alcoholic   Frequent falls   Hypokalemia   Hypomagnesemia   Essential hypertension   Alcohol withdrawal - Blood alcohol level <5 - Patient continues to trigger CIWA, continues to be confused, however appears calmer today and had a calmer overnight. Per wife, his withdrawal lasts ~5 days   Possible seizure - Reported by the wife to [place] - EEG pending still - Placed on seizure precautions - Likely in the setting of alcohol withdrawal, unlikely he will need any AED  Essential hypertension - Continue Dyazide, Coreg  Elevated LFTs/fatty liver - Secondary to alcohol abuse - LFTs currently stable, continue to monitor CMP - AST and bilirubin improving - check ammonia given ongoing confusion  Tachycardia - Continue Coreg - Patient does have history of tachycardia, possibly worsened by alcohol withdrawal  Hypomagnesemia/hypokalemia - Patient does take magnesium and potassium supplements at home - improved  Frequent falls/physical deconditioning - PT and OT consulted, SNF recommended, SW consulted - Patient presented to the emergency department on 03/22/2016 and had x-ray of the ribs  and chest which was negative, CT of the head/maxillofacial which showed no acute intracranial findings, CT of the cervical spine which was negative for acute fractures  Depression/Anxiety - Continue bupropion, Celexa - Home ativan and Xanax held - Placed on CIWA protocol  Eye discharge - with bilateral crusting, local care, cipro drops   DVT prophylaxis: Lovenox Code Status: Full code Family Communication: No family at bedside Disposition Plan: SNF when stops withdrawing, hopefully 2-3 days Barriers for discharge: Alcohol withdrawal  Consultants:   None  Procedures:   None   Antimicrobials:  None    Subjective: - Ongoing confusion, however more appropriate this morning, has no specific complaints  Objective: Filed Vitals:   03/27/16 1500 03/27/16 1725 03/27/16 2037 03/28/16 0418  BP: 180/73 144/83 126/88 102/73  Pulse: 105  65 106  Temp: 98.7 F (37.1 C)  97.7 F (36.5 C) 97.7 F (36.5 C)  TempSrc: Axillary  Oral Axillary  Resp: 20  20 20   Height:      Weight:      SpO2: 98%  98% 98%    Intake/Output Summary (Last 24 hours) at 03/28/16 1425 Last data filed at 03/28/16 0418  Gross per 24 hour  Intake      0 ml  Output    450 ml  Net   -450 ml   Filed Weights   03/23/16 1650 03/24/16 0257 03/24/16 1930  Weight: 87.9 kg (193 lb 12.6 oz) 90.5 kg (199 lb 8.3 oz) 84.9 kg (187 lb 2.7 oz)    Examination: Constitutional: Mildly tremulous Filed Vitals:   03/27/16 1500 03/27/16 1725 03/27/16 2037 03/28/16 0418  BP: 180/73 144/83 126/88 102/73  Pulse: 105  65 106  Temp: 98.7 F (37.1 C)  97.7 F (36.5 C) 97.7 F (36.5 C)  TempSrc: Axillary  Oral Axillary  Resp: 20  20 20   Height:      Weight:      SpO2: 98%  98% 98%  Eyes: PERRL, periorbital ecchymoses  ENMT: Mucous membranes are moist.  Respiratory: clear to auscultation bilaterally, no wheezing, no crackles.  Cardiovascular: Regular rate and rhythm, no murmurs / rubs / gallops. Abdomen: no  tenderness Bowel sounds positive.    Data Reviewed: I have personally reviewed following labs and imaging studies  CBC:  Recent Labs Lab 03/22/16 0030 03/23/16 1311 03/24/16 0321 03/25/16 0523 03/28/16 0526  WBC 7.3 7.4 4.9 4.7 7.9  NEUTROABS 5.2 5.9  --   --   --   HGB 13.5 11.8* 9.4* 11.3* 13.4  HCT 37.5* 32.1* 26.5* 30.9* 37.4*  MCV 100.5* 100.9* 99.6 103.3* 102.2*  PLT 162 141* 138* 167 [id number]   Basic Metabolic Panel:  Recent Labs Lab 03/22/16 0030 03/23/16 1310 03/23/16 1311 03/24/16 0321 03/25/16 0523 03/28/16 0526  NA 139  --  135 137 139 135  K 3.3*  --  3.1* 3.2* 3.8 3.8  CL 97*  --  98* 105 100* 101  CO2 16*  --  23 23 29 22   GLUCOSE 101*  --  159* 129* 114* 121*  BUN 16  --  17 13 7 19   CREATININE 1.08  --  0.92 0.72 0.70 0.94  CALCIUM 9.4  --  9.2 8.6* 9.8 10.1  MG  --  1.5*  --  1.9  --   --    GFR: Estimated Creatinine Clearance: 77.7 mL/min (by C-G formula based on Cr of 0.94). Liver Function Tests:  Recent Labs Lab 03/22/16 0030 03/23/16 1311 03/24/16 0321 03/25/16 0523 03/28/16 0526  AST 135* 66* 53* 71* 78*  ALT 65* 40 34 40 61  ALKPHOS 86 74 65 76 86  BILITOT 2.9* 2.8* 1.6* 1.4* 1.4*  PROT 6.8 5.8* 5.3* 6.4* 7.2  ALBUMIN 3.8 3.3* 2.9* 3.6 3.7   Urine analysis:    Component Value Date/Time   COLORURINE ORANGE* 03/23/2016 1801   APPEARANCEUR CLOUDY* 03/23/2016 1801   LABSPEC 1.028 03/23/2016 1801   PHURINE 6.0 03/23/2016 1801   GLUCOSEU NEGATIVE 03/23/2016 1801   HGBUR NEGATIVE 03/23/2016 1801   BILIRUBINUR LARGE* 03/23/2016 1801   KETONESUR 40* 03/23/2016 1801   PROTEINUR 30* 03/23/2016 1801   UROBILINOGEN 2.0* 04/02/2015 2013   NITRITE POSITIVE* 03/23/2016 1801   LEUKOCYTESUR SMALL* 03/23/2016 1801   Sepsis Labs: Invalid input(s): PROCALCITONIN, LACTICIDVEN  Recent Results (from the past 240 hour(s))  MRSA PCR Screening     Status: None   Collection Time: 03/23/16  4:41 PM  Result Value Ref Range Status   MRSA by PCR  NEGATIVE NEGATIVE Final    Comment:        The GeneXpert MRSA Assay (FDA approved for NASAL specimens only), is one component of a comprehensive MRSA colonization surveillance program. It is not intended to diagnose MRSA infection nor to guide or monitor treatment for MRSA infections.   C difficile quick scan w PCR reflex     Status: Abnormal   Collection Time: 03/24/16 12:40 AM  Result Value Ref Range Status   C Diff antigen POSITIVE (A) NEGATIVE Final   C Diff toxin POSITIVE (A) NEGATIVE Corrected    Comment: CALLED TO DENNIS RN AT 1227 ON 4.7.17 BY EPPERSON,S CORRECTED  ON 04/07 AT 1225: PREVIOUSLY REPORTED AS NEGATIVE    C Diff interpretation Positive for toxigenic C. difficile  Corrected    Comment: CRITICAL RESULT CALLED TO, READ BACK BY AND VERIFIED WITH: DENNIS, RN AT 1227 ON 4.7.17 BY EPPERSON,S CORRECTED ON 04/07 AT 1446: PREVIOUSLY REPORTED AS C. difficile present, but toxin not detected. This indicates colonization. In most cases, this does not require treatment. If patient has signs and symptoms consistent with colitis, consider treatment.  Requires ENTERIC precautions.     Radiology Studies: No results found. Scheduled Meds: . aspirin EC  81 mg Oral Daily  . atorvastatin  40 mg Oral q1800  . buPROPion  100 mg Oral BID  . carvedilol  3.125 mg Oral BID WC  . cholecalciferol  2,000 Units Oral Daily  . ciprofloxacin  1 drop Both Eyes Q4H while awake  . citalopram  20 mg Oral QHS  . enoxaparin (LOVENOX) injection  40 mg Subcutaneous Q24H  . folic acid  1 mg Oral Daily  . lactose free nutrition  237 mL Oral TID WC  . magnesium oxide  400 mg Oral QHS  . multivitamin with minerals  1 tablet Oral Daily  . omega-3 acid ethyl esters  1 g Oral Daily  . potassium chloride SA  20 mEq Oral Daily  . tamsulosin  0.4 mg Oral Daily  . thiamine  100 mg Oral Daily  . triamterene-hydrochlorothiazide  1 tablet Oral Daily  . vancomycin  125 mg Oral 4 times per day   Continuous  Infusions:   Marzetta Board, MD, PhD [facility] Pager [phone]  If 7PM-7AM, please contact night-coverage [email or web address] Password [email or web address] 03/28/2016, 2:25 PM

## 2016-03-28 NOTE — Clinical Social Work Placement (Signed)
   CLINICAL SOCIAL WORK PLACEMENT  NOTE  Date:  03/28/2016  Patient Details  Name: Joshua Henson MRN: [id number] Date of Birth: [birth year]  Clinical Social Work is seeking post-discharge placement for this patient at the [place] level of care (*CSW will initial, date and re-position this form in  chart as items are completed):  Yes   Patient/family provided with [place] Work Department's list of facilities offering this level of care within the geographic area requested by the patient (or if unable, by the patient's family).  Yes   Patient/family informed of their freedom to choose among providers that offer the needed level of care, that participate in Medicare, Medicaid or managed care program needed by the patient, have an available bed and are willing to accept the patient.  Yes   Patient/family informed of [facility]'s ownership interest in [facility] and [facility], as well as of the fact that they are under no obligation to receive care at these facilities.  PASRR submitted to EDS on 03/25/16     PASRR number received on 03/25/16     Existing PASRR number confirmed on       FL2 transmitted to all facilities in geographic area requested by pt/family on       FL2 transmitted to all facilities within larger geographic area on 03/26/16     Patient informed that his/her managed care company has contracts with or will negotiate with certain facilities, including the following:        Yes   Patient/family informed of bed offers received.  Patient chooses bed at       Physician recommends and patient chooses bed at      Patient to be transferred to   on  .  Patient to be transferred to facility by       Patient family notified on   of transfer.  Name of family member notified:        PHYSICIAN       Additional Comment:    _______________________________________________ Harlon Flor, Student-SW 03/28/2016, 1:32 PM

## 2016-03-28 NOTE — Clinical Social Work Note (Addendum)
Clinical Social Work Assessment  Patient Details  Name: Joshua Henson MRN: [id number] Date of Birth: May 09, 1946  Date of referral:  03/28/16               Reason for consult:  Facility Placement                Permission sought to share information with:    Permission granted to share information::     Name::        Agency::     Relationship::     Contact Information:     Housing/Transportation Living arrangements for the past 2 months:  Single Family Home Source of Information:  Patient Patient Interpreter Needed:  None Criminal Activity/Legal Involvement Pertinent to Current Situation/Hospitalization:  No - Comment as needed Significant Relationships:  Significant Other Lives with:  Significant Other Do you feel safe going back to the place where you live?  Yes Need for family participation in patient care:  No (Coment)  Care giving concerns:  Pt admitted from home with significant other. PT recommending short-term rehab at a SNF.   Social Worker assessment / plan:  CSW attempted to assess pt yesterday, pt declined. BSW Intern met with pt today at bedside. BSW Intern introduced self and explained role. Pt states he lives at home with his significant other. BSW Intern explained PT recommendation for short-term rehab and provided pt with bed offers. Pt did not seem too interested in short-term rehab option. Pt discussed his signiificant other is "Super Woman" but it might be difficult to care for him by herself at this time. BSW Intern discussed the option of going home with [facility] services as well. Pt reports he will dicuss options later tonight with significant other.   CSW to follow-up with decision on SNF vs. HH at dc.  Employment status:  Aeronautical engineer:  Medicare PT Recommendations:  [place] / Referral to community resources:  [place]  Patient/Family's Response to care:  According to pt chart pt is only oriented  to self. Pt active in conversation although hard of hearing.   Patient/Family's Understanding of and Emotional Response to Diagnosis, Current Treatment, and Prognosis: Pt understanding of current condition and reports no further questions at this time.  Emotional Assessment Appearance:  Appears stated age Attitude/Demeanor/Rapport:   (Appropriate) Affect (typically observed):  Calm, Quiet Orientation:  Oriented to Self Alcohol / Substance use:  Not Applicable Psych involvement (Current and /or in the community):  No (Comment)  Discharge Needs  Concerns to be addressed:  Care Coordination Readmission within the last 30 days:  No Current discharge risk:  None Barriers to Discharge:  Continued Medical Work up   [employer], Student-SW 03/28/2016, 12:42 PM  Psychosocial assessment reviewed and agree. Pt has SNF bed offers and plans to discuss with significant other about plan for discharge. CSW to follow up tomorrow regarding decision.   Alison Murray, MSW, [place] Work [phone]

## 2016-03-28 NOTE — Procedures (Signed)
History: Joshua Henson is an 70 y.o. male patient with altered mental status . Routine inpatient EEG was performed for further evaluation.   Patient Active Problem List   Diagnosis Date Noted  . Essential hypertension 03/23/2016  . Fall   . Tobacco abuse   . Hypokalemia   . Hypomagnesemia   . Hypophosphatemia   . CAP (community acquired pneumonia) due to MSSA (methicillin sensitive Staphylococcus aureus) ([place])   . CAD in native artery   . Cocaine abuse   . Alcohol abuse with intoxication ([place]) 01/11/2016  . Frequent falls 01/11/2016  . Left rib fracture 01/11/2016  . Acute encephalopathy 01/11/2016  . Polysubstance abuse 01/11/2016  . Delirium tremens ([place]) 01/11/2016  . NSTEMI (non-ST elevated myocardial infarction) ([place]) 01/11/2016  . Sepsis due to Enterococcus with acute renal failure and metabolic encephalopathy ([place]) 01/11/2016  . Alcohol intoxication ([place])   . Chronic alcohol abuse   . Acute respiratory failure with hypoxia ([place])   . Elevated troponin 01/10/2016  . Chest pain 01/10/2016  . CAD (coronary artery disease), native coronary artery 01/10/2016  . Fatty liver, alcoholic [id number]  . Tremor 04/02/2015  . Ataxia   . Alcohol withdrawal ([place]) 09/03/2014  . Unspecified cerebral artery occlusion with cerebral infarction 07/05/2014  . Alcohol abuse 07/02/2014  . Current tobacco use 01/17/2014  . Hearing loss 11/25/2013  . Tinnitus of both ears 11/25/2013    No current facility-administered medications for this encounter. No current outpatient prescriptions on file.  Facility-Administered Medications Ordered in Other Encounters:  .  acetaminophen (TYLENOL) tablet 650 mg, 650 mg, Oral, Q6H PRN **OR** acetaminophen (TYLENOL) suppository 650 mg, 650 mg, Rectal, Q6H PRN, Maryann Mikhail, DO .  aspirin EC tablet 81 mg, 81 mg, Oral, Daily, Maryann Mikhail, DO, 81 mg at 03/27/16 1123 .  atorvastatin (LIPITOR) tablet 40 mg, 40 mg, Oral, q1800, Maryann Mikhail, DO, 40 mg at  03/26/16 1758 .  buPROPion [facility]) tablet 100 mg, 100 mg, Oral, BID, Maryann Mikhail, DO, 100 mg at 03/27/16 2048 .  carvedilol (COREG) tablet 3.125 mg, 3.125 mg, Oral, BID WC, Maryann Mikhail, DO, 3.125 mg at 03/28/16 0811 .  cholecalciferol (VITAMIN D) tablet 2,000 Units, 2,000 Units, Oral, Daily, Maryann Mikhail, DO, 2,000 Units at 03/27/16 1122 .  ciprofloxacin (CILOXAN) 0.3 % ophthalmic solution 1 drop, 1 drop, Both Eyes, Q4H while awake, Costin Karlyne Greenspan, MD, 1 drop at 03/28/16 0606 .  citalopram (CELEXA) tablet 20 mg, 20 mg, Oral, QHS, Maryann Mikhail, DO, 20 mg at 03/27/16 2048 .  enoxaparin (LOVENOX) injection 40 mg, 40 mg, Subcutaneous, Q24H, Maryann Mikhail, DO, 40 mg at 03/27/16 1830 .  folic acid (FOLVITE) tablet 1 mg, 1 mg, Oral, Daily, Maryann Mikhail, DO, 1 mg at 03/27/16 1136 .  HYDROcodone-acetaminophen (NORCO) 10-325 MG per tablet 1 tablet, 1 tablet, Oral, BID PRN, Cristal Ford, DO, 1 tablet at 03/25/16 1303 .  lactose free nutrition (BOOST PLUS) liquid 237 mL, 237 mL, Oral, TID WC, Costin Karlyne Greenspan, MD, 237 mL at 03/28/16 0808 .  LORazepam (ATIVAN) injection 2-3 mg, 2-3 mg, Intravenous, Q1H PRN, Maryann Mikhail, DO, 2 mg at 03/28/16 0223 .  magnesium oxide (MAG-OX) tablet 400 mg, 400 mg, Oral, QHS, Maryann Mikhail, DO, 400 mg at 03/27/16 2048 .  morphine 2 MG/ML injection 2 mg, 2 mg, Intravenous, Q4H PRN, Maryann Mikhail, DO, 2 mg at 03/28/16 0815 .  multivitamin with minerals tablet 1 tablet, 1 tablet, Oral, Daily, Maryann Mikhail, DO, 1 tablet at 03/27/16 1123 .  omega-3 acid ethyl esters (LOVAZA) capsule 1 g, 1 g, Oral, Daily, Maryann Mikhail, DO, 1 g at 03/26/16 1046 .  ondansetron (ZOFRAN) tablet 4 mg, 4 mg, Oral, Q6H PRN **OR** ondansetron (ZOFRAN) injection 4 mg, 4 mg, Intravenous, Q6H PRN, Maryann Mikhail, DO .  potassium chloride SA (K-DUR,KLOR-CON) CR tablet 20 mEq, 20 mEq, Oral, Daily, Maryann Mikhail, DO, 20 mEq at 03/27/16 1123 .  tamsulosin (FLOMAX) capsule  0.4 mg, 0.4 mg, Oral, Daily, Maryann Mikhail, DO, 0.4 mg at 03/27/16 1124 .  thiamine (VITAMIN B-1) tablet 100 mg, 100 mg, Oral, Daily, Maryann Mikhail, DO, 100 mg at 03/27/16 1123 .  triamterene-hydrochlorothiazide (MAXZIDE-25) 37.5-25 MG per tablet 1 tablet, 1 tablet, Oral, Daily, Caren Griffins, MD, 1 tablet at 03/27/16 1123 .  vancomycin (VANCOCIN) 50 mg/mL oral solution 125 mg, 125 mg, Oral, 4 times per day, Caren Griffins, MD, 125 mg at 03/28/16 [id number]   Introduction:  This is a 19 channel routine scalp EEG performed at the bedside with bipolar and monopolar montages arranged in accordance to the international 10/20 system of electrode placement. One channel was dedicated to EKG recording.   Findings:  The background rhythm was normal 9-10 Hz alpha . No definite evidence of abnormal epileptiform discharges or electrographic seizures were noted during this recording.   Impression:  Unremarkable awake and drowsy routine inpatient EEG. Clinical correlation is recommended .

## 2016-03-28 NOTE — Progress Notes (Signed)
Physical Therapy Treatment Patient Details Name: Joshua Henson MRN: [id number] DOB: 23-Mar-1946 Today's Date: 03/28/2016    History of Present Illness 70 yo male admitted 03/23/16 With a history of hypertension, tachycardia, alcoholic liver disease, that presented to the emergency department with complaints of generalized weakness and falls.  wife stated the patient had brief period of stiffness and shaking this morning.     PT Comments    Pt reluctantly participated with PT this session. C/o back pain (medicated ~8 am this morning and RN in to give pain meds during session). Stood at [employer] with Mod assist and RW. Pt was able to take a few steps along the side of bed before abruptly sitting then lying down. Continue to recommend SNF  Follow Up Recommendations  SNF     Equipment Recommendations  None recommended by PT    Recommendations for Other Services       Precautions / Restrictions Precautions Precautions: Fall Restrictions Weight Bearing Restrictions: No    Mobility  Bed Mobility Overal bed mobility: Needs Assistance Bed Mobility: Rolling;Sidelying to Sit;Sit to Supine Rolling: Modified independent (Device/Increase time) Sidelying to sit: Min guard   Sit to supine: Min guard   General bed mobility comments: cues for safety; guarded  Transfers Overall transfer level: Needs assistance Equipment used: Rolling walker (2 wheeled) Transfers: Sit to/from Stand Sit to Stand: Mod assist         General transfer comment: Assist to rise, stabilize, control descent. VCs safety, hand placement.   Ambulation/Gait Ambulation/Gait assistance: Mod assist           General Gait Details: 4 side steps along bed. Assist to stabilize pt and maneuver safely with walker. Weight shifted posteriorly. Cues for proper technique for RW use. Pt sat abruptly and requested to return to bed.    Stairs            Wheelchair Mobility    Modified Rankin (Stroke Patients Only)        Balance Overall balance assessment: Needs assistance   Sitting balance-Leahy Scale: Fair     Standing balance support: During functional activity;Bilateral upper extremity supported Standing balance-Leahy Scale: Poor                      Cognition Arousal/Alertness: Awake/alert Behavior During Therapy: WFL for tasks assessed/performed Overall Cognitive Status: Within Functional Limits for tasks assessed                      Exercises      General Comments        Pertinent Vitals/Pain Pain Assessment: 0-10 Pain Score: 9  Pain Location: back, L side Pain Descriptors / Indicators: Sore Pain Intervention(s): Limited activity within patient's tolerance;Repositioned    Home Living                      Prior Function            PT Goals (current goals can now be found in the care plan section) Progress towards PT goals: Progressing toward goals (very slowly)    Frequency  Min 3X/week    PT Plan Current plan remains appropriate    Co-evaluation             End of Session Equipment Utilized During Treatment: Gait belt Activity Tolerance: Patient limited by pain;Patient limited by fatigue Patient left: in bed;with call bell/phone within reach (bed alarm unable to be reset. RN  okay with leaving mittens off. )     Time: [id number] PT Time Calculation (min) (ACUTE ONLY): 26 min  Charges:  $Therapeutic Activity: 8-22 mins                    G Codes:      Weston Anna, MPT Pager: [phone]

## 2016-03-29 NOTE — Progress Notes (Signed)
Nutrition Follow-up  DOCUMENTATION CODES:   Not applicable  INTERVENTION:  - Continue Boost Plus TID  - Continue to encourage intakes of meals and supplements - RD will continue to monitor for additional needs, need to adjust supplements  NUTRITION DIAGNOSIS:   Inadequate oral intake related to poor appetite as evidenced by per patient/family report. -revised/ongoing  GOAL:   Patient will meet greater than or equal to 90% of their needs -likely unmet  MONITOR:   PO intake, Supplement acceptance, Weight trends, Labs, Skin, I & O's  ASSESSMENT:   Joshua Henson is a 70 yo male With a history of hypertension, tachycardia, alcoholic liver disease, that presented to the emergency department with complaints of generalized weakness and falls.  4/11 No intakes documented since previous assessment. Physical assessment was completed on 4/6 and showed no signs of muscle or fat wasting, no edema. No new weight since that time. Pt continues to be confused and was sleeping at time of visit this AM with no family/visitors present. Spoke with tech outside of pt's room who reports ~25% completion of breakfast and that pt drank ~240 ounces of liquid at that time. Boost Plus is currently ordered TID.   Pt likely not meeting needs; will continue to monitor for need for further nutrition intervention. Medications reviewed. Labs reviewed.   4/6 - He complains of poor appetite for 4-5 days PTA. "I didn't really eat much." - He denies weight loss.  - Patient has bruising across his eyes and nose, all down his right side. - 100% completion of meal tray present on bedside table.  - He had one bottle of boost so far, liked it, will continue to provide during stay.   Diet Order:  Diet Heart Room service appropriate?: Yes; Fluid consistency:: Thin  Skin:  Wound (see comment) (Multiple ecchymosis)  Last BM:  4/6  Height:   Ht Readings from Last 1 Encounters:  03/23/16 5\' 10"  (1.778 m)     Weight:   Wt Readings from Last 1 Encounters:  03/24/16 187 lb 2.7 oz (84.9 kg)    Ideal Body Weight:  75.45 kg  BMI:  Body mass index is 26.86 kg/(m^2).  Estimated Nutritional Needs:   Kcal:  2000-2400 calories  Protein:  90-110 grams  Fluid:  1.2L  EDUCATION NEEDS:   No education needs identified at this time     Jarome Matin, RD, LDN Inpatient Clinical Dietitian Pager # [phone] After hours/weekend pager # [phone]

## 2016-03-29 NOTE — Progress Notes (Signed)
Occupational Therapy Treatment Patient Details Name: Joshua Henson MRN: [id number] DOB: May 14, 1946 Today's Date: 03/29/2016    History of present illness 70 yo male admitted 03/23/16 With a history of hypertension, tachycardia, alcoholic liver disease, that presented to the emergency department with complaints of generalized weakness and falls.  wife stated the patient had brief period of stiffness and shaking this morning.    OT comments  Patient progressing towards OT goals, continue plan of care for now.   Follow Up Recommendations  SNF;Supervision/Assistance - 24 hour    Equipment Recommendations  None recommended by OT    Recommendations for Other Services  None at this time   Precautions / Restrictions Precautions Precautions: Fall Restrictions Weight Bearing Restrictions: No    Mobility Bed Mobility Overal bed mobility: Needs Assistance Bed Mobility: Sit to Supine       Sit to supine: Min guard   General bed mobility comments: cues for safety; guarded  Transfers Overall transfer level: Needs assistance Equipment used: Rolling walker (2 wheeled) Transfers: Sit to/from Stand Sit to Stand: Mod assist General transfer comment: Assist to rise, stabilize, control descent. VCs safety, hand placement.     Balance Overall balance assessment: Needs assistance Sitting-balance support: No upper extremity supported;Feet supported Sitting balance-Leahy Scale: Good     Standing balance support: Bilateral upper extremity supported;During functional activity Standing balance-Leahy Scale: Poor   ADL Overall ADL's : Needs assistance/impaired Eating/Feeding: Set up;Bed level   Grooming: Set up;Bed level   Upper Body Bathing: Minimal assitance;Sitting   Lower Body Bathing: Moderate assistance;Sit to/from stand   Upper Body Dressing : Minimal assistance;Sitting   Lower Body Dressing: Moderate assistance;Sit to/from stand   Toilet Transfer: Minimal assistance;Moderate  assistance;Cueing for safety;RW;BSC;Ambulation   Toileting- Clothing Manipulation and Hygiene: Total assistance;Bed level       Functional mobility during ADLs: Minimal assistance;Moderate assistance;Rolling walker;Cueing for safety;Cueing for sequencing General ADL Comments: Pt with unwitnessed fall this am when trying to get up to go to BR, pt reports he lowered himself to the ground so he didn't fall forward again. Discussed importance of rehab and why his treatment team is recommending rehab prior to home, pt agreed and stated he felt he did need to go.       Vision Additional Comments: black and blue surrounding bilateral eyes          Cognition   Behavior During Therapy: WFL for tasks assessed/performed Overall Cognitive Status: Within Functional Limits for tasks assessed                 Pertinent Vitals/ Pain       Pain Assessment: No/denies pain Pain Score: 8  Pain Location: "everything" Pain Descriptors / Indicators: Sore Pain Intervention(s): Limited activity within patient's tolerance;Monitored during session;Repositioned   Frequency Min 2X/week     Progress Toward Goals  OT Goals(current goals can now befound in the care plan section)  Progress towards OT goals: Progressing toward goals  Acute Rehab OT Goals Patient Stated Goal: to get stronger OT Goal Formulation: With patient Time For Goal Achievement: 04/07/16 Potential to Achieve Goals: Good  Plan Discharge plan remains appropriate    End of Session Equipment Utilized During Treatment: Gait belt;Rolling walker   Activity Tolerance Patient tolerated treatment well   Patient Left in bed;with call bell/phone within reach;with bed alarm set;with nursing/sitter in room  Nurse Communication Mobility status     Time: [id number] OT Time Calculation (min): 14 min  Charges: OT General Charges $OT  Visit: 1 Procedure OT Treatments $Self Care/Home Management : 8-22 mins  Chrys Racer , MS, OTR/L,  [place] Pager: [phone]  03/29/2016, 4:20 PM

## 2016-03-29 NOTE — Progress Notes (Signed)
   03/29/16 0805 03/29/16 0813  What Happened  Was fall witnessed? No --   Was patient injured? No --   Patient found on floor;in bathroom --   Found by Staff-comment Durwin [name] RN, Kenna Gilbert RN ) --   Stated prior activity ambulating-unassisted --   Follow Up  MD notified Gherghe --   Time MD notified 0800 --   Family notified Yes-comment (Wife [phone]) --   Time family notified 0806 --   Additional tests --  No  Simple treatment --  Other (comment) (rest in bed )  Progress note created (see row info) --  Yes  Adult Fall Risk Assessment  Risk Factor Category (scoring not indicated) High fall risk per protocol (document High fall risk) --   Patient's Fall Risk High Fall Risk (>13 points) --   Adult Fall Risk Interventions  Required Bundle Interventions *See Row Information* High fall risk - low, moderate, and high requirements implemented --   Additional Interventions Use of appropriate toileting equipment (bedpan, BSC, etc.) --   Fall with Injury Screening  Risk For Fall Injury- See Row Information  F;D;Nurse judgement --   Intervention(s) for 2 or more risk criteria identified Gait Belt --   Oxygen Therapy  SpO2 --  97 %  O2 Device --  Room [employer]

## 2016-03-29 NOTE — Progress Notes (Signed)
PROGRESS NOTE  Joshua Henson [id number] DOB: Feb 24, 1946 DOA: 03/23/2016 PCP: Mayra Neer, MD Outpatient Specialists:    LOS: 6 days   Brief Narrative: 70 y.o. male with a history of hypertension, tachycardia, alcoholic liver disease, that presented to the emergency department with complaints of generalized weakness and falls. Patient was recently seen in the emergency department on 03/23/2015 after sustaining several falls. Patient was not admitted at that time. Last drink per pt 4/4. He does state that he has been having pain with deep breathing and movement as well as cough. Per reports from [place], wife stated the patient had brief period of stiffness and shaking the morning of admission on 4/5  Interim summary Patient admitted on 4/5 with weakness and severe ETOH withdrawal and a seizure episode. He slowly improved with supportive management however has been persistently encephalopathic up until 4/11 when his mental status started to clear up. SW consulted for SNF however pt wants to go home. Wife is on board with SNF though.  Assessment & Plan: Active Problems:   Alcohol abuse   Alcohol withdrawal (HCC)   Fatty liver, alcoholic   Frequent falls   Hypokalemia   Hypomagnesemia   Essential hypertension   Alcohol withdrawal with acute encephalopathy - Blood alcohol level <5 - confused for ~5 days, today appears improved and close to baseline, AxOx3   Possible seizure - Reported by the wife to EDP - EEG negative - Likely in the setting of alcohol withdrawal, does not need AED  Essential hypertension - Continue Dyazide, Coreg  Elevated LFTs/fatty liver - Secondary to alcohol abuse - LFTs currently stable, continue to monitor CMP - AST and bilirubin improving - given ongoing confusion ammonia checked 4/9 and was normal at 25  Tachycardia, sinus - Continue Coreg - worsened by alcohol withdrawal  Hypomagnesemia/hypokalemia - replete as needed  Frequent  falls/physical deconditioning - x-ray of the ribs and chest which was negative, CT of the head/maxillofacial which showed no acute intracranial findings, CT of the cervical spine which was negative for acute fractures - today 4/11 patient tried to go to the bathroom by himself and had a fall. No head injury.  - he tells me that he wants to go home rather than SNF.  - PT recommending SNF. Discussed with his wife [name] who thinks also he should be in a rehab for some time. Discussed with SW  Depression/Anxiety - Continue bupropion, Celexa - Home ativan and Xanax held - Placed on CIWA protocol  Eye discharge - with bilateral crusting, local care, cipro drops - improving   DVT prophylaxis: Lovenox Code Status: Full code Family Communication: No family at bedside, d/w [name] over the phone Disposition Plan: SNF 2-3 days Barriers for discharge: Alcohol withdrawal, mental status changes  Consultants:   None  Procedures:   None   Antimicrobials:  None    Subjective: - confusion improving, more appropriate this morning, asking to go home when ready rather than SNF - Chest pain, shortness of breath, no abdominal pain, or nausea  Objective: Filed Vitals:   03/28/16 2110 03/29/16 0522 03/29/16 0756 03/29/16 0813  BP: 92/75 135/93 142/89   Pulse: 91 94 136   Temp: 98.9 F (37.2 C) 98.5 F (36.9 C)    TempSrc: Oral Oral    Resp: 20 16 18    Height:      Weight:      SpO2: 97% 97% 89% 97%    Intake/Output Summary (Last 24 hours) at 03/29/16 1204 Last data filed  at 03/29/16 0522  Gross per 24 hour  Intake      0 ml  Output    150 ml  Net   -150 ml   Filed Weights   03/23/16 1650 03/24/16 0257 03/24/16 1930  Weight: 87.9 kg (193 lb 12.6 oz) 90.5 kg (199 lb 8.3 oz) 84.9 kg (187 lb 2.7 oz)    Examination: Constitutional: Mildly tremulous Filed Vitals:   03/28/16 2110 03/29/16 0522 03/29/16 0756 03/29/16 0813  BP: 92/75 135/93 142/89   Pulse: 91 94 136   Temp:  98.9 F (37.2 C) 98.5 F (36.9 C)    TempSrc: Oral Oral    Resp: 20 16 18    Height:      Weight:      SpO2: 97% 97% 89% 97%  Eyes: PERRL, periorbital ecchymoses, improving ENMT: Mucous membranes are moist.  Respiratory: clear to auscultation bilaterally, no wheezing, no crackles.  Cardiovascular: Regular rate and rhythm, no murmurs / rubs / gallops. Abdomen: no tenderness Bowel sounds positive.    Data Reviewed: I have personally reviewed following labs and imaging studies  CBC:  Recent Labs Lab 03/23/16 1311 03/24/16 0321 03/25/16 0523 03/28/16 0526  WBC 7.4 4.9 4.7 7.9  NEUTROABS 5.9  --   --   --   HGB 11.8* 9.4* 11.3* 13.4  HCT 32.1* 26.5* 30.9* 37.4*  MCV 100.9* 99.6 103.3* 102.2*  PLT 141* 138* 167 123XX123   Basic Metabolic Panel:  Recent Labs Lab 03/23/16 1310 03/23/16 1311 03/24/16 0321 03/25/16 0523 03/28/16 0526  NA  --  135 137 139 135  K  --  3.1* 3.2* 3.8 3.8  CL  --  98* 105 100* 101  CO2  --  23 23 29 22   GLUCOSE  --  159* 129* 114* 121*  BUN  --  17 13 7 19   CREATININE  --  0.92 0.72 0.70 0.94  CALCIUM  --  9.2 8.6* 9.8 10.1  MG 1.5*  --  1.9  --   --    GFR: Estimated Creatinine Clearance: 77.7 mL/min (by C-G formula based on Cr of 0.94). Liver Function Tests:  Recent Labs Lab 03/23/16 1311 03/24/16 0321 03/25/16 0523 03/28/16 0526  AST 66* 53* 71* 78*  ALT 40 34 40 61  ALKPHOS 74 65 76 86  BILITOT 2.8* 1.6* 1.4* 1.4*  PROT 5.8* 5.3* 6.4* 7.2  ALBUMIN 3.3* 2.9* 3.6 3.7   Urine analysis:    Component Value Date/Time   COLORURINE ORANGE* 03/23/2016 1801   APPEARANCEUR CLOUDY* 03/23/2016 1801   LABSPEC 1.028 03/23/2016 1801   PHURINE 6.0 03/23/2016 1801   GLUCOSEU NEGATIVE 03/23/2016 1801   HGBUR NEGATIVE 03/23/2016 1801   BILIRUBINUR LARGE* 03/23/2016 1801   KETONESUR 40* 03/23/2016 1801   PROTEINUR 30* 03/23/2016 1801   UROBILINOGEN 2.0* 04/02/2015 2013   NITRITE POSITIVE* 03/23/2016 1801   LEUKOCYTESUR SMALL* 03/23/2016  1801   Sepsis Labs: Invalid input(s): PROCALCITONIN, LACTICIDVEN  Recent Results (from the past 240 hour(s))  MRSA PCR Screening     Status: None   Collection Time: 03/23/16  4:41 PM  Result Value Ref Range Status   MRSA by PCR NEGATIVE NEGATIVE Final    Comment:        The GeneXpert MRSA Assay (FDA approved for NASAL specimens only), is one component of a comprehensive MRSA colonization surveillance program. It is not intended to diagnose MRSA infection nor to guide or monitor treatment for MRSA infections.   C difficile quick  scan w PCR reflex     Status: Abnormal   Collection Time: 03/24/16 12:40 AM  Result Value Ref Range Status   C Diff antigen POSITIVE (A) NEGATIVE Final   C Diff toxin POSITIVE (A) NEGATIVE Corrected    Comment: CALLED TO DENNIS RN AT 1227 ON 4.7.17 BY EPPERSON,S CORRECTED ON 04/07 AT 1225: PREVIOUSLY REPORTED AS NEGATIVE    C Diff interpretation Positive for toxigenic C. difficile  Corrected    Comment: CRITICAL RESULT CALLED TO, READ BACK BY AND VERIFIED WITH: DENNIS, RN AT 1227 ON 4.7.17 BY EPPERSON,S CORRECTED ON 04/07 AT 1446: PREVIOUSLY REPORTED AS C. difficile present, but toxin not detected. This indicates colonization. In most cases, this does not require treatment. If patient has signs and symptoms consistent with colitis, consider treatment.  Requires ENTERIC precautions.     Radiology Studies: No results found. Scheduled Meds: . aspirin EC  81 mg Oral Daily  . atorvastatin  40 mg Oral q1800  . buPROPion  100 mg Oral BID  . carvedilol  3.125 mg Oral BID WC  . cholecalciferol  2,000 Units Oral Daily  . ciprofloxacin  1 drop Both Eyes Q4H while awake  . citalopram  20 mg Oral QHS  . enoxaparin (LOVENOX) injection  40 mg Subcutaneous Q24H  . folic acid  1 mg Oral Daily  . lactose free nutrition  237 mL Oral TID WC  . magnesium oxide  400 mg Oral QHS  . multivitamin with minerals  1 tablet Oral Daily  . omega-3 acid ethyl esters  1 g  Oral Daily  . potassium chloride SA  20 mEq Oral Daily  . tamsulosin  0.4 mg Oral Daily  . thiamine  100 mg Oral Daily  . triamterene-hydrochlorothiazide  1 tablet Oral Daily  . vancomycin  125 mg Oral 4 times per day   Continuous Infusions:   Marzetta Board, MD, PhD [facility] Pager [phone]  If 7PM-7AM, please contact night-coverage [email or web address] Password [facility] 03/29/2016, 12:04 PM

## 2016-03-29 NOTE — Progress Notes (Signed)
CSW continuing to follow.  BSW Intern contacted pt significant other, Michelle via telephone. [name] agrees with PT recommendation for SNF as she does not feel she can care for pt at home at this time. [name] to discuss SNF option with pt and follow-up with decision. [name] feels she and the MD can convince pt.  Pt and Michelle's first choice is [facility] which pt has been to before. CSW and [name] both contacted [facility] who decline pt at this time.  Pt significant other is touring facilities and will follow-up with BSW Intern or CSW with SNF decision.  CSW to continue to follow.  Harlon Flor, [place] Intern Clinical Social Work Department  [phone]

## 2016-03-29 NOTE — Progress Notes (Addendum)
Pt found sitting on floor near bathroom. Pt stated "I was able to sit down," "I fell on my bottom." Pt complains of generalized pain, no acute pain relating to fall. Pt VSS. Pt sitting in bed eating. MD notified. Family contacted about fall. Will continue to monitor.   Kizzie Ide, RN

## 2016-03-30 DIAGNOSIS — N179 Acute kidney failure, unspecified: Secondary | ICD-10-CM | POA: Diagnosis present

## 2016-03-30 DIAGNOSIS — A047 Enterocolitis due to Clostridium difficile: Secondary | ICD-10-CM

## 2016-03-30 DIAGNOSIS — A0472 Enterocolitis due to Clostridium difficile, not specified as recurrent: Secondary | ICD-10-CM | POA: Diagnosis present

## 2016-03-30 LAB — BASIC METABOLIC PANEL
ANION GAP: 9 (ref 5–15)
BUN: 40 mg/dL — ABNORMAL HIGH (ref 6–20)
CHLORIDE: 100 mmol/L — AB (ref 101–111)
CO2: 23 mmol/L (ref 22–32)
CREATININE: 2.27 mg/dL — AB (ref 0.61–1.24)
Calcium: 9.7 mg/dL (ref 8.9–10.3)
GFR calc non Af Amer: 28 mL/min — ABNORMAL LOW (ref >60)
GFR, EST AFRICAN AMERICAN: 32 mL/min — AB (ref >60)
Glucose, Bld: 124 mg/dL — ABNORMAL HIGH (ref 65–99)
Potassium: 4.3 mmol/L (ref 3.5–5.1)
SODIUM: 132 mmol/L — AB (ref 135–145)

## 2016-03-30 LAB — CREATININE, SERUM
CREATININE: 2.22 mg/dL — AB (ref 0.61–1.24)
GFR calc Af Amer: 33 mL/min — ABNORMAL LOW (ref >60)
GFR calc non Af Amer: 29 mL/min — ABNORMAL LOW (ref >60)

## 2016-03-30 LAB — CREATININE, URINE, RANDOM: CREATININE, URINE: 137.86 mg/dL

## 2016-03-30 LAB — SODIUM, URINE, RANDOM: SODIUM UR: 59 mmol/L

## 2016-03-30 MED ORDER — SODIUM CHLORIDE 0.9 % IV SOLN
INTRAVENOUS | Status: DC
  Administered 2016-03-30 – 2016-04-03 (×5): via INTRAVENOUS

## 2016-03-30 NOTE — Progress Notes (Signed)
PROGRESS NOTE  Joshua Henson [id number] DOB: 1946/08/04 DOA: 03/23/2016 PCP: Mayra Neer, MD Outpatient Specialists:    LOS: 7 days   Subjective: Awake alert oriented 4, still feels very weak, has pretty impressive raccoon eyes.  Brief Narrative: 70 y.o. male with a history of hypertension, tachycardia, alcoholic liver disease, that presented to the emergency department with complaints of generalized weakness and falls. Patient was recently seen in the emergency department on 03/23/2015 after sustaining several falls. Patient was not admitted at that time. Last drink per pt 4/4. He does state that he has been having pain with deep breathing and movement as well as cough. Per reports from [place], wife stated the patient had brief period of stiffness and shaking the morning of admission on 4/5  Interim summary Patient admitted on 4/5 with weakness and severe ETOH withdrawal and a seizure episode. He slowly improved with supportive management however has been persistently encephalopathic up until 4/11 when his mental status started to clear up. SW consulted for SNF however pt wants to go home. Wife is on board with SNF though.  Assessment & Plan: Principal Problem:   Alcohol withdrawal ([place]) Active Problems:   Alcohol abuse   Fatty liver, alcoholic   Frequent falls   Hypokalemia   Hypomagnesemia   Essential hypertension   C. difficile colitis   Acute kidney injury ([place])   Alcohol withdrawal with acute encephalopathy - Blood alcohol level <5 - confused for ~5 days, today appears improved and close to baseline, AAOx3.  C. difficile colitis Diarrhea which is positive for C. difficile by PCR. Currently on oral vancomycin 125 mg 4 times a day. Continue current antibiotics, no PPIs add Florastor.  Acute kidney injury Creatinine increased from 0.94 to 2.27, BUN elevated at 40. Discontinued Maxide, check urine studies and start patient on IV fluids and check BMP in a.m.     Possible seizure - Reported by the wife to EDP - EEG negative - Likely in the setting of alcohol withdrawal, does not need AED  Essential hypertension - Continue Coreg  Elevated LFTs/fatty liver - Secondary to alcohol abuse - LFTs currently stable, continue to monitor CMP - AST and bilirubin improving - given ongoing confusion ammonia checked 4/9 and was normal at 25  Tachycardia, sinus - Continue Coreg - worsened by alcohol withdrawal  Hypomagnesemia/hypokalemia - replete as needed  Frequent falls/physical deconditioning - x-ray of the ribs and chest which was negative, CT of the head/maxillofacial which showed no acute intracranial findings, CT of the cervical spine which was negative for acute fractures - today 4/11 patient tried to go to the bathroom by himself and had a fall. No head injury.  - he tells me that he wants to go home rather than SNF.  - PT recommending SNF. Discussed with his wife [name] who thinks also he should be in a rehab for some time. Discussed with SW  Depression/Anxiety - Continue bupropion, Celexa - Home ativan and Xanax held - Placed on CIWA protocol  Eye discharge - with bilateral crusting, local care, cipro drops - improving   DVT prophylaxis: Lovenox Code Status: Full code Family Communication: No family at bedside, d/w [name] over the phone Disposition Plan: SNF 2-3 days Barriers for discharge: Alcohol withdrawal, mental status changes  Consultants:   None  Procedures:   None   Antimicrobials:  None    Objective: Filed Vitals:   03/29/16 2004 03/30/16 0420 03/30/16 0510 03/30/16 1006  BP: 99/79  114/72 115/86  Pulse: 98  94 86  Temp: 98.3 F (36.8 C)  97.7 F (36.5 C) 98.1 F (36.7 C)  TempSrc: Oral  Oral Oral  Resp: 20  20 20   Height:  5\' 10"  (1.778 m)    Weight:  88.8 kg (195 lb 12.3 oz)    SpO2: 95%  96% 96%    Intake/Output Summary (Last 24 hours) at 03/30/16 1535 Last data filed at 03/30/16 0753   Gross per 24 hour  Intake    540 ml  Output    900 ml  Net   -360 ml   Filed Weights   03/24/16 0257 03/24/16 1930 03/30/16 0420  Weight: 90.5 kg (199 lb 8.3 oz) 84.9 kg (187 lb 2.7 oz) 88.8 kg (195 lb 12.3 oz)    Examination: Constitutional: Mildly tremulous Filed Vitals:   03/29/16 2004 03/30/16 0420 03/30/16 0510 03/30/16 1006  BP: 99/79  114/72 115/86  Pulse: 98  94 86  Temp: 98.3 F (36.8 C)  97.7 F (36.5 C) 98.1 F (36.7 C)  TempSrc: Oral  Oral Oral  Resp: 20  20 20   Height:  5\' 10"  (1.778 m)    Weight:  88.8 kg (195 lb 12.3 oz)    SpO2: 95%  96% 96%  Eyes: PERRL, periorbital ecchymoses, improving ENMT: Mucous membranes are moist.  Respiratory: clear to auscultation bilaterally, no wheezing, no crackles.  Cardiovascular: Regular rate and rhythm, no murmurs / rubs / gallops. Abdomen: no tenderness Bowel sounds positive.    Data Reviewed: I have personally reviewed following labs and imaging studies  CBC:  Recent Labs Lab 03/24/16 0321 03/25/16 0523 03/28/16 0526  WBC 4.9 4.7 7.9  HGB 9.4* 11.3* 13.4  HCT 26.5* 30.9* 37.4*  MCV 99.6 103.3* 102.2*  PLT 138* 167 123XX123   Basic Metabolic Panel:  Recent Labs Lab 03/24/16 0321 03/25/16 0523 03/28/16 0526 03/30/16 0524  NA 137 139 135 132*  K 3.2* 3.8 3.8 4.3  CL 105 100* 101 100*  CO2 23 29 22 23   GLUCOSE 129* 114* 121* 124*  BUN 13 7 19  40*  CREATININE 0.72 0.70 0.94 2.27*  2.22*  CALCIUM 8.6* 9.8 10.1 9.7  MG 1.9  --   --   --    GFR: Estimated Creatinine Clearance: 35.7 mL/min (by C-G formula based on Cr of 2.22). Liver Function Tests:  Recent Labs Lab 03/24/16 0321 03/25/16 0523 03/28/16 0526  AST 53* 71* 78*  ALT 34 40 61  ALKPHOS 65 76 86  BILITOT 1.6* 1.4* 1.4*  PROT 5.3* 6.4* 7.2  ALBUMIN 2.9* 3.6 3.7   Urine analysis:    Component Value Date/Time   COLORURINE ORANGE* 03/23/2016 1801   APPEARANCEUR CLOUDY* 03/23/2016 1801   LABSPEC 1.028 03/23/2016 1801   PHURINE 6.0  03/23/2016 1801   GLUCOSEU NEGATIVE 03/23/2016 1801   HGBUR NEGATIVE 03/23/2016 1801   BILIRUBINUR LARGE* 03/23/2016 1801   KETONESUR 40* 03/23/2016 1801   PROTEINUR 30* 03/23/2016 1801   UROBILINOGEN 2.0* 04/02/2015 2013   NITRITE POSITIVE* 03/23/2016 1801   LEUKOCYTESUR SMALL* 03/23/2016 1801   Sepsis Labs: Invalid input(s): PROCALCITONIN, LACTICIDVEN  Recent Results (from the past 240 hour(s))  MRSA PCR Screening     Status: None   Collection Time: 03/23/16  4:41 PM  Result Value Ref Range Status   MRSA by PCR NEGATIVE NEGATIVE Final    Comment:        The GeneXpert MRSA Assay (FDA approved for NASAL specimens only), is one component of a comprehensive  MRSA colonization surveillance program. It is not intended to diagnose MRSA infection nor to guide or monitor treatment for MRSA infections.   C difficile quick scan w PCR reflex     Status: Abnormal   Collection Time: 03/24/16 12:40 AM  Result Value Ref Range Status   C Diff antigen POSITIVE (A) NEGATIVE Final   C Diff toxin POSITIVE (A) NEGATIVE Corrected    Comment: CALLED TO DENNIS RN AT 1227 ON 4.7.17 BY EPPERSON,S CORRECTED ON 04/07 AT 1225: PREVIOUSLY REPORTED AS NEGATIVE    C Diff interpretation Positive for toxigenic C. difficile  Corrected    Comment: CRITICAL RESULT CALLED TO, READ BACK BY AND VERIFIED WITH: DENNIS, RN AT 1227 ON 4.7.17 BY EPPERSON,S CORRECTED ON 04/07 AT 1446: PREVIOUSLY REPORTED AS C. difficile present, but toxin not detected. This indicates colonization. In most cases, this does not require treatment. If patient has signs and symptoms consistent with colitis, consider treatment.  Requires ENTERIC precautions.     Radiology Studies: No results found. Scheduled Meds: . aspirin EC  81 mg Oral Daily  . atorvastatin  40 mg Oral q1800  . buPROPion  100 mg Oral BID  . carvedilol  3.125 mg Oral BID WC  . cholecalciferol  2,000 Units Oral Daily  . ciprofloxacin  1 drop Both Eyes Q4H while  awake  . citalopram  20 mg Oral QHS  . enoxaparin (LOVENOX) injection  40 mg Subcutaneous Q24H  . folic acid  1 mg Oral Daily  . lactose free nutrition  237 mL Oral TID WC  . magnesium oxide  400 mg Oral QHS  . multivitamin with minerals  1 tablet Oral Daily  . omega-3 acid ethyl esters  1 g Oral Daily  . potassium chloride SA  20 mEq Oral Daily  . tamsulosin  0.4 mg Oral Daily  . thiamine  100 mg Oral Daily  . triamterene-hydrochlorothiazide  1 tablet Oral Daily  . vancomycin  125 mg Oral 4 times per day   Continuous Infusions:   Birdie Hopes, MD [facility] Pager [phone]  If 7PM-7AM, please contact night-coverage [email or web address] Password [facility] 03/30/2016, 3:35 PM

## 2016-03-30 NOTE — Progress Notes (Signed)
Physical Therapy Treatment Patient Details Name: [name] MRN: [id number] DOB: 1946/09/05 Today's Date: 03/30/2016    History of Present Illness 70 yo male admitted 03/23/16 With a history of hypertension, tachycardia, alcoholic liver disease, that presented to the emergency department with complaints of generalized weakness and falls.  wife stated the patient had brief period of stiffness and shaking this morning.     PT Comments    Pt agreeable to participating with therapy. Min assist +2 for mobility-walked ~45 feet with RW. Posterior bias noted intermittently during session. Pt with impaired balance reactions. Poor safety awareness and insight into current condition. Very questionable reasoning/logic expressed when pt was attempting to explain how he will manage at home. Pt remains at high risk for falls! Continue to recommend SNF for ST rehab.   Follow Up Recommendations  SNF     Equipment Recommendations  None recommended by PT    Recommendations for Other Services       Precautions / Restrictions Precautions Precautions: Fall Restrictions Weight Bearing Restrictions: No    Mobility  Bed Mobility Overal bed mobility: Needs Assistance Bed Mobility: Supine to Sit;Sit to Supine     Supine to sit: Min guard Sit to supine: Min guard   General bed mobility comments: cues for safety; guarded  Transfers Overall transfer level: Needs assistance Equipment used: Rolling walker (2 wheeled) Transfers: Sit to/from Stand Sit to Stand: +2 safety/equipment;Min assist         General transfer comment: Assist to rise, stabilize, control descent. VCs safety, hand placement. Uncontrolled descent. Posterior bias  Ambulation/Gait Ambulation/Gait assistance: Min assist;+2 physical assistance;+2 safety/equipment Ambulation Distance (Feet): 45 Feet Assistive device: Rolling walker (2 wheeled) Gait Pattern/deviations: Step-through pattern;Decreased stride length     General  Gait Details: Assist to stabilize and maneuver safely with walker. LOB posteriorly especially with stop/start. Pt fatigues fairly easily. Followed closely with recliner.    Stairs            Wheelchair Mobility    Modified Rankin (Stroke Patients Only)       Balance Overall balance assessment: Needs assistance         Standing balance support: Bilateral upper extremity supported;During functional activity Standing balance-Leahy Scale: Poor Little/delayed reaction to posterior LOB                      Cognition Arousal/Alertness: Awake/alert Behavior During Therapy: WFL for tasks assessed/performed Overall Cognitive Status: Impaired/Different from baseline Area of Impairment: Safety/judgement;Problem solving         Safety/Judgement: Decreased awareness of safety;Decreased awareness of deficits   Problem Solving: Requires tactile cues;Requires verbal cues      Exercises      General Comments        Pertinent Vitals/Pain Pain Assessment: No/denies pain    Home Living                      Prior Function            PT Goals (current goals can now be found in the care plan section) Progress towards PT goals: Progressing toward goals (slowly)    Frequency  Min 3X/week    PT Plan Current plan remains appropriate    Co-evaluation             End of Session Equipment Utilized During Treatment: Gait belt Activity Tolerance: Patient limited by fatigue Patient left: in bed;with call bell/phone within reach;with bed alarm set  Time: [id number] PT Time Calculation (min) (ACUTE ONLY): 20 min  Charges:  $Gait Training: 8-22 mins                    G Codes:      [place] [facility] [id number]

## 2016-03-30 NOTE — Progress Notes (Signed)
CSW continuing to follow.   CSW spoke with pt girlfriend today regarding disposition planning. Support provided to pt girlfriend as she discussed that she was concerned about the available bed offers and about documentation not indicating pt needs as facilities have reported to her that "generalized weakness" will not qualify for Medicare coverage. CSW explained to pt girlfriend that the only documentation that this CSW sees that states generalized weakness is that this was a chief complaint when entering the ED. CSW shared with pt girlfriend that unfortunately pt hx of ETOH use may be a barrier to facilities offering pt placement. Pt girlfriend expressed understanding, but hopeful that pt SNF search could be re-initiated since it has been a number of days since initial search and pt has made improvements. CSW discussed with pt girlfriend that CSW can re-initiate SNF search and update information, but unable to guarantee that there will be a change in current bed offers. Pt girlfriend expressed understanding and appreciative of re-initiation of SNF search.  CSW will update pt information and re-initiate SNF search to determine if any offer change of any more facilities are able to offer pt a bed.  CSW to continue to follow to provide support and assist with pt disposition needs.   Alison Murray, MSW, [place] [employer] [phone]

## 2016-03-30 NOTE — Progress Notes (Signed)
OT Cancellation Note  Patient Details Name: Joshua Henson MRN: [id number] DOB: [birth year]   Cancelled Treatment:    Reason Eval/Treat Not Completed: Fatigue/lethargy limiting ability to participate.  Pt had just had medications and sleepy. Will check back another day.  Khadim Lundberg 03/30/2016, 3:09 PM  Lesle Chris, OTR/L [phone] 03/30/2016

## 2016-03-30 NOTE — NC FL2 (Signed)
[place] LEVEL OF CARE SCREENING TOOL     IDENTIFICATION  Patient Name: Joshua Henson Birthdate: Apr 14, 1946 Sex: male Admission Date (Current Location): 03/23/2016  [facility] and [place] Number:  Herbalist and Address:  [facility],  [place] [address] [place]      Provider Number: [id number]  Attending Physician Name and Address:  Verlee Monte, MD  Relative Name and Phone Number:       Current Level of Care: Hospital Recommended Level of Care: [place] Prior Approval Number:    Date Approved/Denied:   PASRR Number: [id number]  Discharge Plan: SNF    Current Diagnoses: Patient Active Problem List   Diagnosis Date Noted  . C. difficile colitis 03/30/2016  . Acute kidney injury ([place]) 03/30/2016  . Essential hypertension 03/23/2016  . Fall   . Tobacco abuse   . Hypokalemia   . Hypomagnesemia   . Hypophosphatemia   . CAP (community acquired pneumonia) due to MSSA (methicillin sensitive Staphylococcus aureus) ([place])   . CAD in native artery   . Cocaine abuse   . Alcohol abuse with intoxication ([place]) 01/11/2016  . Frequent falls 01/11/2016  . Left rib fracture 01/11/2016  . Acute encephalopathy 01/11/2016  . Polysubstance abuse 01/11/2016  . Delirium tremens ([place]) 01/11/2016  . NSTEMI (non-ST elevated myocardial infarction) ([place]) 01/11/2016  . Sepsis due to Enterococcus with acute renal failure and metabolic encephalopathy ([place]) 01/11/2016  . Alcohol intoxication ([place])   . Chronic alcohol abuse   . Acute respiratory failure with hypoxia ([place])   . Elevated troponin 01/10/2016  . Chest pain 01/10/2016  . CAD (coronary artery disease), native coronary artery 01/10/2016  . Fatty liver, alcoholic [id number]  . Tremor 04/02/2015  . Ataxia   . Alcohol withdrawal ([place]) 09/03/2014  . Unspecified cerebral artery occlusion with cerebral infarction 07/05/2014  . Alcohol abuse 07/02/2014  . Current tobacco use  01/17/2014  . Hearing loss 11/25/2013  . Tinnitus of both ears 11/25/2013    Orientation RESPIRATION BLADDER Height & Weight     Self, Time, Situation, Place  Normal Incontinent, External catheter Weight: 195 lb 12.3 oz (88.8 kg) Height:  5\' 10"  (177.8 cm)  BEHAVIORAL SYMPTOMS/MOOD NEUROLOGICAL BOWEL NUTRITION STATUS  Other (Comment) (confusion improved and close to baseline, AxOx3) Convulsions/Seizures (Possible seizure) Continent Diet (Diet Heart)  AMBULATORY STATUS COMMUNICATION OF NEEDS Skin   Extensive Assist (min assist, +2 for safety equipment) Verbally Other (Comment) (right great toe laceration)                       Personal Care Assistance Level of Assistance  Bathing, Feeding, Dressing Bathing Assistance: Limited assistance Feeding assistance: Independent Dressing Assistance: Limited assistance     Functional Limitations Info  Sight, Hearing, Speech Sight Info: Adequate Hearing Info: Impaired Speech Info: Adequate    SPECIAL CARE FACTORS FREQUENCY  PT (By licensed PT), OT (By licensed OT)     PT Frequency: 5 x a week OT Frequency: 5 x a week            Contractures Contractures Info: Not present    Additional Factors Info  Code Status, Allergies, Isolation Precautions, Psychotropic Code Status Info: FULL code status Allergies Info: Lisinopril, Celebrex Psychotropic Info: Wellbutrin, Celexa   Isolation Precautions Info: Enteric Precautions     Current Medications (03/30/2016):  This is the current hospital active medication list Current Facility-Administered Medications  Medication Dose Route Frequency Provider Last Rate  Last Dose  . 0.9 %  sodium chloride infusion   Intravenous Continuous Verlee Monte, MD 100 mL/hr at 03/30/16 1609    . acetaminophen (TYLENOL) tablet 650 mg  650 mg Oral Q6H PRN Maryann Mikhail, DO       Or  . acetaminophen (TYLENOL) suppository 650 mg  650 mg Rectal Q6H PRN Maryann Mikhail, DO      . aspirin EC tablet 81 mg   81 mg Oral Daily Maryann Mikhail, DO   81 mg at 03/30/16 0936  . atorvastatin (LIPITOR) tablet 40 mg  40 mg Oral q1800 Maryann Mikhail, DO   40 mg at 03/29/16 1816  . buPROPion [facility]) tablet 100 mg  100 mg Oral BID Maryann Mikhail, DO   100 mg at 03/30/16 0936  . carvedilol (COREG) tablet 3.125 mg  3.125 mg Oral BID WC Maryann Mikhail, DO   3.125 mg at 03/30/16 0936  . cholecalciferol (VITAMIN D) tablet 2,000 Units  2,000 Units Oral Daily Maryann Mikhail, DO   2,000 Units at 03/30/16 0935  . ciprofloxacin (CILOXAN) 0.3 % ophthalmic solution 1 drop  1 drop Both Eyes Q4H while awake Costin Karlyne Greenspan, MD   1 drop at 03/30/16 1353  . citalopram (CELEXA) tablet 20 mg  20 mg Oral QHS Maryann Mikhail, DO   20 mg at 03/29/16 2153  . enoxaparin (LOVENOX) injection 40 mg  40 mg Subcutaneous Q24H Maryann Mikhail, DO   40 mg at 03/29/16 1817  . folic acid (FOLVITE) tablet 1 mg  1 mg Oral Daily Maryann Mikhail, DO   1 mg at 03/30/16 0936  . HYDROcodone-acetaminophen (NORCO) 10-325 MG per tablet 1 tablet  1 tablet Oral BID PRN Cristal Ford, DO   1 tablet at 03/30/16 0829  . lactose free nutrition (BOOST PLUS) liquid 237 mL  237 mL Oral TID WC Costin Karlyne Greenspan, MD   237 mL at 03/30/16 1200  . LORazepam (ATIVAN) injection 2-3 mg  2-3 mg Intravenous Q1H PRN Maryann Mikhail, DO   2 mg at 03/30/16 1427  . magnesium oxide (MAG-OX) tablet 400 mg  400 mg Oral QHS Maryann Mikhail, DO   400 mg at 03/29/16 2153  . morphine 2 MG/ML injection 2 mg  2 mg Intravenous Q4H PRN Maryann Mikhail, DO   2 mg at 03/30/16 1357  . multivitamin with minerals tablet 1 tablet  1 tablet Oral Daily Maryann Mikhail, DO   1 tablet at 03/30/16 0936  . omega-3 acid ethyl esters (LOVAZA) capsule 1 g  1 g Oral Daily Maryann Mikhail, DO   1 g at 03/30/16 0935  . ondansetron (ZOFRAN) tablet 4 mg  4 mg Oral Q6H PRN Maryann Mikhail, DO       Or  . ondansetron (ZOFRAN) injection 4 mg  4 mg Intravenous Q6H PRN Maryann Mikhail, DO      .  potassium chloride SA (K-DUR,KLOR-CON) CR tablet 20 mEq  20 mEq Oral Daily Maryann Mikhail, DO   20 mEq at 03/30/16 0936  . tamsulosin (FLOMAX) capsule 0.4 mg  0.4 mg Oral Daily Maryann Mikhail, DO   0.4 mg at 03/30/16 0935  . thiamine (VITAMIN B-1) tablet 100 mg  100 mg Oral Daily Maryann Mikhail, DO   100 mg at 03/30/16 0936  . vancomycin (VANCOCIN) 50 mg/mL oral solution 125 mg  125 mg Oral 4 times per day Caren Griffins, MD   125 mg at 03/30/16 1353     Discharge Medications: Please see discharge summary  for a list of discharge medications.  Relevant Imaging Results:  Relevant Lab Results:   Additional Information SSN: [id number]  Lakrisha Iseman A, LCSW

## 2016-03-31 LAB — BASIC METABOLIC PANEL
Anion gap: 9 (ref 5–15)
BUN: 32 mg/dL — ABNORMAL HIGH (ref 6–20)
CHLORIDE: 104 mmol/L (ref 101–111)
CO2: 22 mmol/L (ref 22–32)
CREATININE: 1.52 mg/dL — AB (ref 0.61–1.24)
Calcium: 9.2 mg/dL (ref 8.9–10.3)
GFR calc non Af Amer: 45 mL/min — ABNORMAL LOW (ref >60)
GFR, EST AFRICAN AMERICAN: 53 mL/min — AB (ref >60)
GLUCOSE: 138 mg/dL — AB (ref 65–99)
Potassium: 4.2 mmol/L (ref 3.5–5.1)
Sodium: 135 mmol/L (ref 135–145)

## 2016-03-31 LAB — UREA NITROGEN, URINE: Urea Nitrogen, Ur: 713 mg/dL

## 2016-03-31 NOTE — Consult Note (Signed)
   [facility] Inpatient Consult   03/31/2016  [facility] 16-Jan-1946 [id number]   Patient screened for potential [facility] Care Management services. Chart reviewed. Noted discharge plans are likely for SNF. Will not engage for [place] Management program at this time.. If patient's post hospital needs change please place a [facility] Care Management consult. For questions please contact:  Marthenia Rolling, [place], RN,BSN [place] [facility] Liaison [phone]

## 2016-03-31 NOTE — Progress Notes (Signed)
03/31/16 0704  What Happened  Was fall witnessed? No  Was patient injured? No  Patient found on floor  Found by Staff-comment  Stated prior activity ambulating-unassisted  Follow Up  MD notified [place]  Time MD notified 0700  Family notified No- patient refusal  Additional tests No  Progress note created (see row info) Yes  Adult Fall Risk Assessment  Risk Factor Category (scoring not indicated) Fall has occurred during this admission (document High fall risk)  Patient's Fall Risk High Fall Risk (>13 points)  Adult Fall Risk Interventions  Required Bundle Interventions *See Row Information* High fall risk - low, moderate, and high requirements implemented  Additional Interventions Fall risk signage  Fall with Injury Screening  Risk For Fall Injury- See Row Information  F;Nurse judgement  Intervention(s) for 2 or more risk criteria identified Gait Belt  Vitals  Temp 98 F (36.7 C)  Temp Source Oral  BP (!) 132/93 mmHg  BP Location Left Arm  BP Method Automatic  Patient Position (if appropriate) Lying  Pulse Rate 72  Pulse Rate Source Dinamap  Cardiac Rhythm NSR  Resp 18  Oxygen Therapy  SpO2 97 %  O2 Device Room Air  Pain Assessment  Pain Assessment 0-10  Pain Score 4  Patients Stated Pain Goal 3  Pain Intervention(s) Repositioned  PCA/Epidural/Spinal Assessment  Respiratory Pattern Regular;Unlabored  Neurological  Neuro (WDL) X  Level of Consciousness Alert  Orientation Level Oriented to person;Oriented to place;Disoriented to time;Disoriented to situation  Cognition Impulsive;Poor judgement;Follows commands;Poor safety awareness  Speech Clear  Pupil Assessment  No  R Pupil Size (mm) 2  R Pupil Shape Round  R Pupil Reaction Brisk  L Pupil Size (mm) 2  L Pupil Shape Round  L Pupil Reaction Brisk  Additional Pupil Assessments No  Motor Function/Sensation Assessment Grip;Plantar flexion;Dorsiflexion  Facial Symmetry Symmetrical  R Hand Grip Present;Strong  L  Hand Grip Present;Strong  R Foot Dorsiflexion Present;Strong  L Foot Dorsiflexion Present;Strong  R Foot Plantar Flexion Present;Strong  L Foot Plantar Flexion Present;Strong  Neuro Symptoms None  Neuro Additional Assessments No  Glasgow Coma Scale  Eye Opening 4  Best Verbal Response (NON-intubated) 5  Best Motor Response 6  Glasgow Coma Scale Score 15  Musculoskeletal  Musculoskeletal (WDL) X  Assistive Device Front wheel walker  Generalized Weakness Yes  Weight Bearing Restrictions No  Integumentary  Integumentary (WDL) X  Skin Color Appropriate for ethnicity  Skin Condition Dry  Skin Integrity Abrasion;Rash;Excoriated (scratch marks)  Abrasion Location Face;Eye;Arm;Leg;Buttocks;Nose  Abrasion Location Orientation Bilateral  Ecchymosis Location Arm;Back;Eye;Face;Hip;Leg  Ecchymosis Location Orientation Right;Left;Mid;Bilateral  Excoriated Location Leg  Excoriated Location Orientation Bilateral  Rash Location Leg  Rash Location Orientation Bilateral

## 2016-03-31 NOTE — Progress Notes (Signed)
PROGRESS NOTE  Joshua Henson [id number] DOB: 1946-08-05 DOA: 03/23/2016 PCP: Mayra Neer, MD Outpatient Specialists:    LOS: 8 days   Subjective: [place] earlier today, no obvious injuries, denies LOC. Neuro checks, hold on discharging to nursing home.  Brief Narrative: 70 y.o. male with a history of hypertension, tachycardia, alcoholic liver disease, that presented to the emergency department with complaints of generalized weakness and falls. Patient was recently seen in the emergency department on 03/23/2015 after sustaining several falls. Patient was not admitted at that time. Last drink per pt 4/4. He does state that he has been having pain with deep breathing and movement as well as cough. Per reports from [place], wife stated the patient had brief period of stiffness and shaking the morning of admission on 4/5  Interim summary Patient admitted on 4/5 with weakness and severe ETOH withdrawal and a seizure episode. He slowly improved with supportive management however has been persistently encephalopathic up until 4/11 when his mental status started to clear up. SW consulted for SNF however pt wants to go home. Wife is on board with SNF though.  Assessment & Plan: Principal Problem:   Alcohol withdrawal ([place]) Active Problems:   Alcohol abuse   Fatty liver, alcoholic   Frequent falls   Hypokalemia   Hypomagnesemia   Essential hypertension   C. difficile colitis   Acute kidney injury ([place])   Alcohol withdrawal with acute encephalopathy - Blood alcohol level <5 - Confused for ~5 days, today appears improved and close to baseline, AAOx3. - Feels okay, denies any complaints.  C. difficile colitis Diarrhea which is positive for C. difficile by PCR. Currently on oral vancomycin 125 mg 4 times a day. Continue current antibiotics, no PPIs add Florastor.  Acute kidney injury Creatinine increased from 0.94 to 2.27, BUN elevated at 40. Discontinued Maxide, check urine studies  and start patient on IV fluids and check BMP in a.m.   Possible seizure - Reported by the wife to EDP - EEG negative - Likely in the setting of alcohol withdrawal, does not need AED  Essential hypertension - Continue Coreg  Elevated LFTs/fatty liver - Secondary to alcohol abuse. - LFTs currently stable, continue to monitor CMP - AST and bilirubin improving - Given ongoing confusion ammonia checked 4/9 and was normal at 25  Tachycardia, sinus - Continue Coreg - worsened by alcohol withdrawal  Hypomagnesemia/hypokalemia - replete as needed  Frequent falls/physical deconditioning - x-ray of the ribs and chest which was negative, CT of the head/maxillofacial which showed no acute intracranial findings, CT of the cervical spine which was negative for acute fractures - today 4/11 patient tried to go to the bathroom by himself and had a fall. No head injury.  - he tells me that he wants to go home rather than SNF.  - PT recommending SNF. Discussed with his wife [name] who thinks also he should be in a rehab for some time. Discussed with SW  Depression/Anxiety -Continue bupropion, Celexa -Home ativan and Xanax held -Placed on CIWA protocol  Eye discharge - with bilateral crusting, local care, cipro drops - improving   DVT prophylaxis: Lovenox Code Status: Full code Family Communication: No family at bedside, d/w [name] over the phone Disposition Plan: SNF 2-3 days Barriers for discharge: Alcohol withdrawal, mental status changes  Consultants:   None  Procedures:   None   Antimicrobials:  None    Objective: Filed Vitals:   03/31/16 0431 03/31/16 0540 03/31/16 0704 03/31/16 0817  BP:  129/81  132/93 128/76  Pulse:  83 72 85  Temp:  97.8 F (36.6 C) 98 F (36.7 C) 97.6 F (36.4 C)  TempSrc:  Oral Oral Oral  Resp:  18 18 14   Height:      Weight: 86.4 kg (190 lb 7.6 oz)     SpO2:  94% 97% 94%    Intake/Output Summary (Last 24 hours) at 03/31/16  1355 Last data filed at 03/31/16 0500  Gross per 24 hour  Intake      0 ml  Output    700 ml  Net   -700 ml   Filed Weights   03/24/16 1930 03/30/16 0420 03/31/16 0431  Weight: 84.9 kg (187 lb 2.7 oz) 88.8 kg (195 lb 12.3 oz) 86.4 kg (190 lb 7.6 oz)    Examination: Constitutional: Mildly tremulous Filed Vitals:   03/31/16 0431 03/31/16 0540 03/31/16 0704 03/31/16 0817  BP:  129/81 132/93 128/76  Pulse:  83 72 85  Temp:  97.8 F (36.6 C) 98 F (36.7 C) 97.6 F (36.4 C)  TempSrc:  Oral Oral Oral  Resp:  18 18 14   Height:      Weight: 86.4 kg (190 lb 7.6 oz)     SpO2:  94% 97% 94%  Eyes: PERRL, periorbital ecchymoses, improving ENMT: Mucous membranes are moist.  Respiratory: clear to auscultation bilaterally, no wheezing, no crackles.  Cardiovascular: Regular rate and rhythm, no murmurs / rubs / gallops. Abdomen: no tenderness Bowel sounds positive.    Data Reviewed: I have personally reviewed following labs and imaging studies  CBC:  Recent Labs Lab 03/25/16 0523 03/28/16 0526  WBC 4.7 7.9  HGB 11.3* 13.4  HCT 30.9* 37.4*  MCV 103.3* 102.2*  PLT 167 [id number]   Basic Metabolic Panel:  Recent Labs Lab 03/25/16 0523 03/28/16 0526 03/30/16 0524 03/31/16 0758  NA 139 135 132* 135  K 3.8 3.8 4.3 4.2  CL 100* 101 100* 104  CO2 29 22 23 22   GLUCOSE 114* 121* 124* 138*  BUN 7 19 40* 32*  CREATININE 0.70 0.94 2.27*  2.22* 1.52*  CALCIUM 9.8 10.1 9.7 9.2   GFR: Estimated Creatinine Clearance: 48 mL/min (by C-G formula based on Cr of 1.52). Liver Function Tests:  Recent Labs Lab 03/25/16 0523 03/28/16 0526  AST 71* 78*  ALT 40 61  ALKPHOS 76 86  BILITOT 1.4* 1.4*  PROT 6.4* 7.2  ALBUMIN 3.6 3.7   Urine analysis:    Component Value Date/Time   COLORURINE ORANGE* 03/23/2016 1801   APPEARANCEUR CLOUDY* 03/23/2016 1801   LABSPEC 1.028 03/23/2016 1801   PHURINE 6.0 03/23/2016 1801   GLUCOSEU NEGATIVE 03/23/2016 1801   HGBUR NEGATIVE 03/23/2016 1801    BILIRUBINUR LARGE* 03/23/2016 1801   KETONESUR 40* 03/23/2016 1801   PROTEINUR 30* 03/23/2016 1801   UROBILINOGEN 2.0* 04/02/2015 2013   NITRITE POSITIVE* 03/23/2016 1801   LEUKOCYTESUR SMALL* 03/23/2016 1801   Sepsis Labs: Invalid input(s): PROCALCITONIN, LACTICIDVEN  Recent Results (from the past 240 hour(s))  MRSA PCR Screening     Status: None   Collection Time: 03/23/16  4:41 PM  Result Value Ref Range Status   MRSA by PCR NEGATIVE NEGATIVE Final    Comment:        The GeneXpert MRSA Assay (FDA approved for NASAL specimens only), is one component of a comprehensive MRSA colonization surveillance program. It is not intended to diagnose MRSA infection nor to guide or monitor treatment for MRSA infections.   C difficile  quick scan w PCR reflex     Status: Abnormal   Collection Time: 03/24/16 12:40 AM  Result Value Ref Range Status   C Diff antigen POSITIVE (A) NEGATIVE Final   C Diff toxin POSITIVE (A) NEGATIVE Corrected    Comment: CALLED TO DENNIS RN AT 1227 ON 4.7.17 BY EPPERSON,S CORRECTED ON 04/07 AT 1225: PREVIOUSLY REPORTED AS NEGATIVE    C Diff interpretation Positive for toxigenic C. difficile  Corrected    Comment: CRITICAL RESULT CALLED TO, READ BACK BY AND VERIFIED WITH: DENNIS, RN AT 1227 ON 4.7.17 BY EPPERSON,S CORRECTED ON 04/07 AT 1446: PREVIOUSLY REPORTED AS C. difficile present, but toxin not detected. This indicates colonization. In most cases, this does not require treatment. If patient has signs and symptoms consistent with colitis, consider treatment.  Requires ENTERIC precautions.     Radiology Studies: No results found. Scheduled Meds: . aspirin EC  81 mg Oral Daily  . atorvastatin  40 mg Oral q1800  . buPROPion  100 mg Oral BID  . carvedilol  3.125 mg Oral BID WC  . cholecalciferol  2,000 Units Oral Daily  . ciprofloxacin  1 drop Both Eyes Q4H while awake  . citalopram  20 mg Oral QHS  . enoxaparin (LOVENOX) injection  40 mg Subcutaneous  Q24H  . folic acid  1 mg Oral Daily  . lactose free nutrition  237 mL Oral TID WC  . magnesium oxide  400 mg Oral QHS  . multivitamin with minerals  1 tablet Oral Daily  . omega-3 acid ethyl esters  1 g Oral Daily  . potassium chloride SA  20 mEq Oral Daily  . tamsulosin  0.4 mg Oral Daily  . thiamine  100 mg Oral Daily  . vancomycin  125 mg Oral 4 times per day   Continuous Infusions: . sodium chloride 100 mL/hr at 03/31/16 1226    Ambra Haverstick A, MD [facility] Pager [phone]  If 7PM-7AM, please contact night-coverage [email or web address] Password [email or web address] 03/31/2016, 1:55 PM

## 2016-04-01 LAB — BASIC METABOLIC PANEL
Anion gap: 9 (ref 5–15)
BUN: 20 mg/dL (ref 6–20)
CALCIUM: 9.8 mg/dL (ref 8.9–10.3)
CO2: 23 mmol/L (ref 22–32)
CREATININE: 1.13 mg/dL (ref 0.61–1.24)
Chloride: 109 mmol/L (ref 101–111)
GFR calc non Af Amer: >60 mL/min (ref >60)
Glucose, Bld: 105 mg/dL — ABNORMAL HIGH (ref 65–99)
Potassium: 4.3 mmol/L (ref 3.5–5.1)
Sodium: 141 mmol/L (ref 135–145)

## 2016-04-01 NOTE — Care Management Important Message (Addendum)
Important Message  Patient Details IM Letter given to [name]/Case Manager to present to Patient Name: Joshua Henson MRN: [id number] Date of Birth: 03-10-1946   Medicare Important Message Given:  Yes    Camillo Flaming 04/01/2016, 9:15 AMImportant Message  Patient Details  Name: Joshua Henson MRN: [id number] Date of Birth: May 10, 1946   Medicare Important Message Given:  Yes    Camillo Flaming 04/01/2016, 9:14 AM

## 2016-04-01 NOTE — Progress Notes (Signed)
Physical Therapy Treatment Patient Details Name: MELCHIOR RUTSTEIN MRN: [id number] DOB: 08/18/1946 Today's Date: 04/01/2016    History of Present Illness 70 yo male admitted 03/23/16 With a history of hypertension, tachycardia, alcoholic liver disease, that presented to the emergency department with complaints of generalized weakness and falls.  wife stated the patient had brief period of stiffness and shaking this morning.     PT Comments    Pt remains very unsteady and at risk for falls. Fatigues fairly easily with activity. Continues to have decreased safety awareness and poor insight into present condition. Continue to recommend SNF for therapy.   Follow Up Recommendations  SNF     Equipment Recommendations  None recommended by PT    Recommendations for Other Services       Precautions / Restrictions Precautions Precautions: Fall Restrictions Weight Bearing Restrictions: No    Mobility  Bed Mobility Overal bed mobility: Needs Assistance Bed Mobility: Supine to Sit;Sit to Supine     Supine to sit: Min guard Sit to supine: Min guard   General bed mobility comments: cues for safety; guarded  Transfers Overall transfer level: Needs assistance Equipment used: Rolling walker (2 wheeled) Transfers: Sit to/from Stand Sit to Stand: Mod assist         General transfer comment: Assist to rise, stabilize, control descent. VCs safety, hand placement. Uncontrolled descent. Posterior bias  Ambulation/Gait Ambulation/Gait assistance: Mod assist Ambulation Distance (Feet): 75 Feet Assistive device: Rolling walker (2 wheeled) Gait Pattern/deviations: Step-through pattern;Decreased stride length     General Gait Details: Assist to stabilize and maneuver safely with walker. Very unsteady and remains at risk for falls. Mod cues for safety.    Stairs            Wheelchair Mobility    Modified Rankin (Stroke Patients Only)       Balance Overall balance assessment:  Needs assistance         Standing balance support: Bilateral upper extremity supported;During functional activity Standing balance-Leahy Scale: Poor                      Cognition Arousal/Alertness: Awake/alert Behavior During Therapy: WFL for tasks assessed/performed Overall Cognitive Status: Impaired/Different from baseline Area of Impairment: Safety/judgement;Problem solving         Safety/Judgement: Decreased awareness of safety;Decreased awareness of deficits   Problem Solving: Requires tactile cues;Requires verbal cues      Exercises      General Comments        Pertinent Vitals/Pain Pain Assessment: No/denies pain    Home Living                      Prior Function            PT Goals (current goals can now be found in the care plan section) Progress towards PT goals: Progressing toward goals    Frequency  Min 3X/week    PT Plan Current plan remains appropriate    Co-evaluation             End of Session Equipment Utilized During Treatment: Gait belt Activity Tolerance: Patient limited by fatigue Patient left: in bed;with call bell/phone within reach;with bed alarm set;with restraints reapplied;with nursing/sitter in room     Time: [id number] PT Time Calculation (min) (ACUTE ONLY): 17 min  Charges:  $Gait Training: 8-22 mins  G Codes:      Weston Anna, MPT Pager: [phone]

## 2016-04-01 NOTE — Progress Notes (Signed)
Occupational Therapy Treatment Patient Details Name: Joshua Henson MRN: [id number] DOB: Jul 08, 1946 Today's Date: 04/01/2016    History of present illness 70 yo male admitted 03/23/16 With a history of hypertension, tachycardia, alcoholic liver disease, that presented to the emergency department with complaints of generalized weakness and falls.  wife stated the patient had brief period of stiffness and shaking this morning.    OT comments  Pt very unsteady and high falls risk.  Multimodal cues for safety  Follow Up Recommendations  SNF    Equipment Recommendations  None recommended by OT    Recommendations for Other Services      Precautions / Restrictions Precautions Precautions: Fall Restrictions Weight Bearing Restrictions: No       Mobility Bed Mobility Overal bed mobility: Needs Assistance Bed Mobility: Supine to Sit;Sit to Supine     Supine to sit: Min guard Sit to supine: Min guard   General bed mobility comments: for safety  Transfers Overall transfer level: Needs assistance Equipment used: Rolling walker (2 wheeled) Transfers: Sit to/from Stand Sit to Stand: Min assist;Mod assist         General transfer comment: assist to rise and stabilize.  Mod A to control descent as pt does not control descent    Balance Overall balance assessment: Needs assistance         Standing balance support: Bilateral upper extremity supported Standing balance-Leahy Scale: Poor                     ADL       Grooming: Oral care;Minimal assistance;Standing                                 General ADL Comments: pt very unsteady/unsafe. Multimodal cues given.  Pt agreeable to standing at sink to brush teeth. He did not want to do any other ADL, however, he poured cup of water that was meant to rinse his mouth, over his head.  Tried and combed hair sitting EOB and changed gown once he laid back down.  Set up for grooming from seated position. Pt is  very impulsive.  Laid back down once he sat up first time.  He sat quickly and lines were not fully managed, so he had to stand up again.      Vision                     Perception     Praxis      Cognition   Behavior During Therapy: [facility] for tasks assessed/performed Overall Cognitive Status: Impaired/Different from baseline Area of Impairment: Safety/judgement;Problem solving          Safety/Judgement: Decreased awareness of safety;Decreased awareness of deficits   Problem Solving: Requires tactile cues;Requires verbal cues      Extremity/Trunk Assessment               Exercises     Shoulder Instructions       General Comments      Pertinent Vitals/ Pain       Pain Assessment: Faces Faces Pain Scale: Hurts little more Pain Location: all over Pain Intervention(s): Limited activity within patient's tolerance;Monitored during session;Repositioned  Home Living  Prior Functioning/Environment              Frequency Min 2X/week     Progress Toward Goals  OT Goals(current goals can now be found in the care plan section)  Progress towards OT goals: Progressing toward goals     Plan Discharge plan remains appropriate    Co-evaluation                 End of Session     Activity Tolerance Patient tolerated treatment well   Patient Left in bed;with call bell/phone within reach;with nursing/sitter in room   Nurse Communication          Time: [id number] OT Time Calculation (min): 16 min  Charges: OT General Charges $OT Visit: 1 Procedure OT Treatments $Self Care/Home Management : 8-22 mins  Alysabeth Scalia 04/01/2016, 12:07 PM  Lesle Chris, OTR/L [phone] 04/01/2016

## 2016-04-01 NOTE — Progress Notes (Signed)
PROGRESS NOTE  Joshua Henson [id number] DOB: [birth year] DOA: 03/23/2016 PCP: Mayra Neer, MD Outpatient Specialists:    LOS: 9 days   Subjective: Denies any new complaints  Brief Narrative: 70 y.o. male with a history of hypertension, tachycardia, alcoholic liver disease, that presented to the emergency department with complaints of generalized weakness and falls. Patient was recently seen in the emergency department on 03/23/2015 after sustaining several falls. Patient was not admitted at that time. Last drink per pt 4/4. He does state that he has been having pain with deep breathing and movement as well as cough. Per reports from [place], wife stated the patient had brief period of stiffness and shaking the morning of admission on 4/5  Interim summary Patient admitted on 4/5 with weakness and severe ETOH withdrawal and a seizure episode. He slowly improved with supportive management however has been persistently encephalopathic up until 4/11 when his mental status started to clear up. SW consulted for SNF however pt wants to go home. Wife is on board with SNF though.  Assessment & Plan: Principal Problem:   Alcohol withdrawal ([place]) Active Problems:   Alcohol abuse   Fatty liver, alcoholic   Frequent falls   Hypokalemia   Hypomagnesemia   Essential hypertension   C. difficile colitis   Acute kidney injury ([place])   Alcohol withdrawal with acute encephalopathy - Blood alcohol level <5 - Improved mentation, he has confusion every now and then. But back to his baseline. - Feels okay, denies any complaints.  C. difficile colitis -Diarrhea which is positive for C. difficile by PCR. -Currently on oral vancomycin 125 mg 4 times a day. -Continue current antibiotics, no PPIs and add Florastor.  Acute kidney injury -Creatinine increased from 0.94 to 2.27, BUN elevated at 40. -This is resolved.   Possible seizure - Reported by the wife to EDP - EEG negative - Likely in the  setting of alcohol withdrawal, does not need AED  Essential hypertension - Continue Coreg  Elevated LFTs/fatty liver - Secondary to alcohol abuse. - LFTs currently stable, continue to monitor CMP - AST and bilirubin improving - Given ongoing confusion ammonia checked 4/9 and was normal at 25  Tachycardia, sinus - Continue Coreg - worsened by alcohol withdrawal  Hypomagnesemia/hypokalemia - replete as needed  Frequent falls/physical deconditioning - x-ray of the ribs and chest which was negative, CT of the head/maxillofacial which showed no acute intracranial findings, CT of the cervical spine which was negative for acute fractures - today 4/11 patient tried to go to the bathroom by himself and had a fall. No head injury.  - he tells me that he wants to go home rather than SNF.  - PT recommending SNF. Discussed with his wife [name] who thinks also he should be in a rehab for some time. Discussed with SW  Depression/Anxiety -Continue bupropion, Celexa -Home ativan and Xanax held -Placed on CIWA protocol  Eye discharge - with bilateral crusting, local care, cipro drops - improving   DVT prophylaxis: Lovenox Code Status: Full code Family Communication: No family at bedside, d/w [name] over the phone Disposition Plan: SNF 2-3 days Barriers for discharge: Alcohol withdrawal, mental status changes  Consultants:   None  Procedures:   None   Antimicrobials:  None    Objective: Filed Vitals:   04/01/16 0500 04/01/16 0545 04/01/16 0817 04/01/16 1007  BP:  136/93  152/79  Pulse:  66  64  Temp:  98.1 F (36.7 C)  98.2 F (36.8 C)  TempSrc:  Oral  Oral  Resp:  16  17  Height:      Weight: 87.2 kg (192 lb 3.9 oz)  88.8 kg (195 lb 12.3 oz)   SpO2:  95%  97%    Intake/Output Summary (Last 24 hours) at 04/01/16 1608 Last data filed at 04/01/16 1233  Gross per 24 hour  Intake 4531.66 ml  Output   1703 ml  Net 2828.66 ml   Filed Weights   03/31/16 0431  04/01/16 0500 04/01/16 0817  Weight: 86.4 kg (190 lb 7.6 oz) 87.2 kg (192 lb 3.9 oz) 88.8 kg (195 lb 12.3 oz)    Examination: Constitutional: Mildly tremulous Filed Vitals:   04/01/16 0500 04/01/16 0545 04/01/16 0817 04/01/16 1007  BP:  136/93  152/79  Pulse:  66  64  Temp:  98.1 F (36.7 C)  98.2 F (36.8 C)  TempSrc:  Oral  Oral  Resp:  16  17  Height:      Weight: 87.2 kg (192 lb 3.9 oz)  88.8 kg (195 lb 12.3 oz)   SpO2:  95%  97%  Eyes: PERRL, periorbital ecchymoses, improving ENMT: Mucous membranes are moist.  Respiratory: clear to auscultation bilaterally, no wheezing, no crackles.  Cardiovascular: Regular rate and rhythm, no murmurs / rubs / gallops. Abdomen: no tenderness Bowel sounds positive.    Data Reviewed: I have personally reviewed following labs and imaging studies  CBC:  Recent Labs Lab 03/28/16 0526  WBC 7.9  HGB 13.4  HCT 37.4*  MCV 102.2*  PLT 123XX123   Basic Metabolic Panel:  Recent Labs Lab 03/28/16 0526 03/30/16 0524 03/31/16 0758 04/01/16 0530  NA 135 132* 135 141  K 3.8 4.3 4.2 4.3  CL 101 100* 104 109  CO2 22 23 22 23   GLUCOSE 121* 124* 138* 105*  BUN 19 40* 32* 20  CREATININE 0.94 2.27*  2.22* 1.52* 1.13  CALCIUM 10.1 9.7 9.2 9.8   GFR: Estimated Creatinine Clearance: 70.2 mL/min (by C-G formula based on Cr of 1.13). Liver Function Tests:  Recent Labs Lab 03/28/16 0526  AST 78*  ALT 61  ALKPHOS 86  BILITOT 1.4*  PROT 7.2  ALBUMIN 3.7   Urine analysis:    Component Value Date/Time   COLORURINE ORANGE* 03/23/2016 1801   APPEARANCEUR CLOUDY* 03/23/2016 1801   LABSPEC 1.028 03/23/2016 1801   PHURINE 6.0 03/23/2016 1801   GLUCOSEU NEGATIVE 03/23/2016 1801   HGBUR NEGATIVE 03/23/2016 1801   BILIRUBINUR LARGE* 03/23/2016 1801   KETONESUR 40* 03/23/2016 1801   PROTEINUR 30* 03/23/2016 1801   UROBILINOGEN 2.0* 04/02/2015 2013   NITRITE POSITIVE* 03/23/2016 1801   LEUKOCYTESUR SMALL* 03/23/2016 1801   Sepsis  Labs: Invalid input(s): PROCALCITONIN, LACTICIDVEN  Recent Results (from the past 240 hour(s))  MRSA PCR Screening     Status: None   Collection Time: 03/23/16  4:41 PM  Result Value Ref Range Status   MRSA by PCR NEGATIVE NEGATIVE Final    Comment:        The GeneXpert MRSA Assay (FDA approved for NASAL specimens only), is one component of a comprehensive MRSA colonization surveillance program. It is not intended to diagnose MRSA infection nor to guide or monitor treatment for MRSA infections.   C difficile quick scan w PCR reflex     Status: Abnormal   Collection Time: 03/24/16 12:40 AM  Result Value Ref Range Status   C Diff antigen POSITIVE (A) NEGATIVE Final   C Diff toxin POSITIVE (A) NEGATIVE Corrected  Comment: CALLED TO DENNIS RN AT 1227 ON 4.7.17 BY EPPERSON,S CORRECTED ON 04/07 AT 1225: PREVIOUSLY REPORTED AS NEGATIVE    C Diff interpretation Positive for toxigenic C. difficile  Corrected    Comment: CRITICAL RESULT CALLED TO, READ BACK BY AND VERIFIED WITH: DENNIS, RN AT 1227 ON 4.7.17 BY EPPERSON,S CORRECTED ON 04/07 AT 1446: PREVIOUSLY REPORTED AS C. difficile present, but toxin not detected. This indicates colonization. In most cases, this does not require treatment. If patient has signs and symptoms consistent with colitis, consider treatment.  Requires ENTERIC precautions.     Radiology Studies: No results found. Scheduled Meds: . aspirin EC  81 mg Oral Daily  . atorvastatin  40 mg Oral q1800  . buPROPion  100 mg Oral BID  . carvedilol  3.125 mg Oral BID WC  . cholecalciferol  2,000 Units Oral Daily  . ciprofloxacin  1 drop Both Eyes Q4H while awake  . citalopram  20 mg Oral QHS  . enoxaparin (LOVENOX) injection  40 mg Subcutaneous Q24H  . folic acid  1 mg Oral Daily  . lactose free nutrition  237 mL Oral TID WC  . magnesium oxide  400 mg Oral QHS  . multivitamin with minerals  1 tablet Oral Daily  . omega-3 acid ethyl esters  1 g Oral Daily  .  potassium chloride SA  20 mEq Oral Daily  . tamsulosin  0.4 mg Oral Daily  . thiamine  100 mg Oral Daily  . vancomycin  125 mg Oral 4 times per day   Continuous Infusions: . sodium chloride 100 mL/hr at 03/31/16 2200    [facility] A, MD [facility] Pager [phone]  If 7PM-7AM, please contact night-coverage [email or web address] Password [facility] 04/01/2016, 4:08 PM

## 2016-04-01 NOTE — Progress Notes (Addendum)
CSW assisting with d/c planning. Spoke with [name] ( significant other ) this am. Pt has agreed to accept a bed at [facility], for rehab at d/c. Earliest [facility] can admit is Monday. Pt will need to be restraint / sitter free for 24 hrs. Prior to d/c to SNF.  Werner Lean LCSW [phone]

## 2016-04-02 LAB — BASIC METABOLIC PANEL
Anion gap: 8 (ref 5–15)
BUN: 15 mg/dL (ref 6–20)
CALCIUM: 9.3 mg/dL (ref 8.9–10.3)
CO2: 24 mmol/L (ref 22–32)
Chloride: 106 mmol/L (ref 101–111)
Creatinine, Ser: 1.1 mg/dL (ref 0.61–1.24)
GFR calc Af Amer: >60 mL/min (ref >60)
GLUCOSE: 105 mg/dL — AB (ref 65–99)
Potassium: 4 mmol/L (ref 3.5–5.1)
SODIUM: 138 mmol/L (ref 135–145)

## 2016-04-02 MED ORDER — LORAZEPAM 1 MG PO TABS
1.0000 mg | ORAL_TABLET | Freq: Every day | ORAL | Status: DC | PRN
  Administered 2016-04-02 – 2016-04-04 (×3): 1 mg via ORAL
  Filled 2016-04-02 (×4): qty 1

## 2016-04-02 NOTE — Progress Notes (Signed)
PROGRESS NOTE  Joshua Henson [id number] DOB: September 14, 1946 DOA: 03/23/2016 PCP: Mayra Neer, MD Outpatient Specialists:    LOS: 10 days   Subjective: Per nursing staff in and out of confusion. No more falls.  Brief Narrative: 70 y.o. male with a history of hypertension, tachycardia, alcoholic liver disease, that presented to the emergency department with complaints of generalized weakness and falls. Patient was recently seen in the emergency department on 03/23/2015 after sustaining several falls. Patient was not admitted at that time. Last drink per pt 4/4. He does state that he has been having pain with deep breathing and movement as well as cough. Per reports from [place], wife stated the patient had brief period of stiffness and shaking the morning of admission on 4/5  Interim summary Patient admitted on 4/5 with weakness and severe ETOH withdrawal and a seizure episode. He slowly improved with supportive management however has been persistently encephalopathic up until 4/11 when his mental status started to clear up. SW consulted for SNF however pt wants to go home. Wife is on board with SNF though.  Assessment & Plan: Principal Problem:   Alcohol withdrawal ([place]) Active Problems:   Alcohol abuse   Fatty liver, alcoholic   Frequent falls   Hypokalemia   Hypomagnesemia   Essential hypertension   C. difficile colitis   Acute kidney injury ([place])   Alcohol withdrawal with acute encephalopathy - Blood alcohol level <5 - Improved mentation, he has confusion every now and then. But back to his baseline. - Feels okay, denies any complaints. Await nursing home bed availability on Monday.  C. difficile colitis -Diarrhea which is positive for C. difficile by PCR. -Currently on oral vancomycin 125 mg 4 times a day. -Continue current antibiotics, no PPIs and add Florastor.  Acute kidney injury -Creatinine increased from 0.94 to 2.27, BUN elevated at 40. -This is resolved.     Possible seizure - Reported by the wife to EDP - EEG negative - Likely in the setting of alcohol withdrawal, does not need AED  Essential hypertension - Continue Coreg  Elevated LFTs/fatty liver - Secondary to alcohol abuse. - LFTs currently stable, continue to monitor CMP - AST and bilirubin improving - Given ongoing confusion ammonia checked 4/9 and was normal at 25  Tachycardia, sinus - Continue Coreg - worsened by alcohol withdrawal  Hypomagnesemia/hypokalemia - replete as needed  Frequent falls/physical deconditioning - x-ray of the ribs and chest which was negative, CT of the head/maxillofacial which showed no acute intracranial findings, CT of the cervical spine which was negative for acute fractures - today 4/11 patient tried to go to the bathroom by himself and had a fall. No head injury.  - he tells me that he wants to go home rather than SNF.  - PT recommending SNF. Discussed with his wife [name] who thinks also he should be in a rehab for some time. Discussed with SW  Depression/Anxiety -Continue bupropion, Celexa -Home ativan and Xanax held -Placed on CIWA protocol  Eye discharge - with bilateral crusting, local care, cipro drops - improving   DVT prophylaxis: Lovenox Code Status: Full code Family Communication: No family at bedside Disposition Plan: SNF 2-3 days Barriers for discharge: Alcohol withdrawal, mental status changes  Consultants:   None  Procedures:   None   Antimicrobials:  None    Objective: Filed Vitals:   04/01/16 1007 04/02/16 0101 04/02/16 0455 04/02/16 0500  BP: 152/79 138/80 132/84 132/84  Pulse: 64 66 74 76  Temp: 98.2 F (  36.8 C) 98.6 F (37 C) 98.3 F (36.8 C)   TempSrc: Oral Oral Oral   Resp: 17 16 16    Height:      Weight:      SpO2: 97% 97% 97%     Intake/Output Summary (Last 24 hours) at 04/02/16 1225 Last data filed at 04/02/16 0916  Gross per 24 hour  Intake    200 ml  Output    351 ml  Net    -151 ml   Filed Weights   03/31/16 0431 04/01/16 0500 04/01/16 0817  Weight: 86.4 kg (190 lb 7.6 oz) 87.2 kg (192 lb 3.9 oz) 88.8 kg (195 lb 12.3 oz)    Examination: Constitutional: Mildly tremulous Filed Vitals:   04/01/16 1007 04/02/16 0101 04/02/16 0455 04/02/16 0500  BP: 152/79 138/80 132/84 132/84  Pulse: 64 66 74 76  Temp: 98.2 F (36.8 C) 98.6 F (37 C) 98.3 F (36.8 C)   TempSrc: Oral Oral Oral   Resp: 17 16 16    Height:      Weight:      SpO2: 97% 97% 97%   Eyes: PERRL, periorbital ecchymoses, improving ENMT: Mucous membranes are moist.  Respiratory: clear to auscultation bilaterally, no wheezing, no crackles.  Cardiovascular: Regular rate and rhythm, no murmurs / rubs / gallops. Abdomen: no tenderness Bowel sounds positive.    Data Reviewed: I have personally reviewed following labs and imaging studies  CBC:  Recent Labs Lab 03/28/16 0526  WBC 7.9  HGB 13.4  HCT 37.4*  MCV 102.2*  PLT 123XX123   Basic Metabolic Panel:  Recent Labs Lab 03/28/16 0526 03/30/16 0524 03/31/16 0758 04/01/16 0530 04/02/16 0527  NA 135 132* 135 141 138  K 3.8 4.3 4.2 4.3 4.0  CL 101 100* 104 109 106  CO2 22 23 22 23 24   GLUCOSE 121* 124* 138* 105* 105*  BUN 19 40* 32* 20 15  CREATININE 0.94 2.27*  2.22* 1.52* 1.13 1.10  CALCIUM 10.1 9.7 9.2 9.8 9.3   GFR: Estimated Creatinine Clearance: 72.1 mL/min (by C-G formula based on Cr of 1.1). Liver Function Tests:  Recent Labs Lab 03/28/16 0526  AST 78*  ALT 61  ALKPHOS 86  BILITOT 1.4*  PROT 7.2  ALBUMIN 3.7   Urine analysis:    Component Value Date/Time   COLORURINE ORANGE* 03/23/2016 1801   APPEARANCEUR CLOUDY* 03/23/2016 1801   LABSPEC 1.028 03/23/2016 1801   PHURINE 6.0 03/23/2016 1801   GLUCOSEU NEGATIVE 03/23/2016 1801   HGBUR NEGATIVE 03/23/2016 1801   BILIRUBINUR LARGE* 03/23/2016 1801   KETONESUR 40* 03/23/2016 1801   PROTEINUR 30* 03/23/2016 1801   UROBILINOGEN 2.0* 04/02/2015 2013   NITRITE  POSITIVE* 03/23/2016 1801   LEUKOCYTESUR SMALL* 03/23/2016 1801   Sepsis Labs: Invalid input(s): PROCALCITONIN, LACTICIDVEN  Recent Results (from the past 240 hour(s))  MRSA PCR Screening     Status: None   Collection Time: 03/23/16  4:41 PM  Result Value Ref Range Status   MRSA by PCR NEGATIVE NEGATIVE Final    Comment:        The GeneXpert MRSA Assay (FDA approved for NASAL specimens only), is one component of a comprehensive MRSA colonization surveillance program. It is not intended to diagnose MRSA infection nor to guide or monitor treatment for MRSA infections.   C difficile quick scan w PCR reflex     Status: Abnormal   Collection Time: 03/24/16 12:40 AM  Result Value Ref Range Status   C Diff antigen  POSITIVE (A) NEGATIVE Final   C Diff toxin POSITIVE (A) NEGATIVE Corrected    Comment: CALLED TO DENNIS RN AT 1227 ON 4.7.17 BY EPPERSON,S CORRECTED ON 04/07 AT 1225: PREVIOUSLY REPORTED AS NEGATIVE    C Diff interpretation Positive for toxigenic C. difficile  Corrected    Comment: CRITICAL RESULT CALLED TO, READ BACK BY AND VERIFIED WITH: DENNIS, RN AT 1227 ON 4.7.17 BY EPPERSON,S CORRECTED ON 04/07 AT 1446: PREVIOUSLY REPORTED AS C. difficile present, but toxin not detected. This indicates colonization. In most cases, this does not require treatment. If patient has signs and symptoms consistent with colitis, consider treatment.  Requires ENTERIC precautions.     Radiology Studies: No results found. Scheduled Meds: . aspirin EC  81 mg Oral Daily  . atorvastatin  40 mg Oral q1800  . buPROPion  100 mg Oral BID  . carvedilol  3.125 mg Oral BID WC  . cholecalciferol  2,000 Units Oral Daily  . ciprofloxacin  1 drop Both Eyes Q4H while awake  . citalopram  20 mg Oral QHS  . enoxaparin (LOVENOX) injection  40 mg Subcutaneous Q24H  . folic acid  1 mg Oral Daily  . lactose free nutrition  237 mL Oral TID WC  . magnesium oxide  400 mg Oral QHS  . multivitamin with  minerals  1 tablet Oral Daily  . omega-3 acid ethyl esters  1 g Oral Daily  . potassium chloride SA  20 mEq Oral Daily  . tamsulosin  0.4 mg Oral Daily  . thiamine  100 mg Oral Daily  . vancomycin  125 mg Oral 4 times per day   Continuous Infusions: . sodium chloride 100 mL/hr at 04/01/16 2200    [facility] A, MD [facility] Pager [phone]  If 7PM-7AM, please contact night-coverage [email or web address] Password [facility] 04/02/2016, 12:25 PM

## 2016-04-02 NOTE — Progress Notes (Signed)
Per MD, OK to leave out IV which was pulled out this a.m. by patient and patient refused IV team.

## 2016-04-03 LAB — BASIC METABOLIC PANEL
ANION GAP: 12 (ref 5–15)
BUN: 14 mg/dL (ref 6–20)
CO2: 21 mmol/L — ABNORMAL LOW (ref 22–32)
Calcium: 9.5 mg/dL (ref 8.9–10.3)
Chloride: 108 mmol/L (ref 101–111)
Creatinine, Ser: 1 mg/dL (ref 0.61–1.24)
GFR calc Af Amer: >60 mL/min (ref >60)
GFR calc non Af Amer: >60 mL/min (ref >60)
GLUCOSE: 108 mg/dL — AB (ref 65–99)
Potassium: 4.1 mmol/L (ref 3.5–5.1)
Sodium: 141 mmol/L (ref 135–145)

## 2016-04-03 MED ORDER — LORAZEPAM 1 MG PO TABS
2.0000 mg | ORAL_TABLET | Freq: Once | ORAL | Status: AC
  Administered 2016-04-03: 2 mg via ORAL
  Filled 2016-04-03: qty 2

## 2016-04-03 MED ORDER — LORAZEPAM 2 MG/ML IJ SOLN: 2.0000 mg | Freq: Once | INTRAMUSCULAR | Status: AC

## 2016-04-03 NOTE — Progress Notes (Signed)
PROGRESS NOTE  Joshua Henson [id number] DOB: August 16, 1946 DOA: 03/23/2016 PCP: Mayra Neer, MD Outpatient Specialists:    LOS: 11 days   Subjective: No changes clinically await bed for discharge in a.m.  Brief Narrative: 70 y.o. male with a history of hypertension, tachycardia, alcoholic liver disease, that presented to the emergency department with complaints of generalized weakness and falls. Patient was recently seen in the emergency department on 03/23/2015 after sustaining several falls. Patient was not admitted at that time. Last drink per pt 4/4. He does state that he has been having pain with deep breathing and movement as well as cough. Per reports from [place], wife stated the patient had brief period of stiffness and shaking the morning of admission on 4/5  Interim summary Patient admitted on 4/5 with weakness and severe ETOH withdrawal and a seizure episode. He slowly improved with supportive management however has been persistently encephalopathic up until 4/11 when his mental status started to clear up. SW consulted for SNF however pt wants to go home. Wife is on board with SNF though.  Assessment & Plan: Principal Problem:   Alcohol withdrawal ([place]) Active Problems:   Alcohol abuse   Fatty liver, alcoholic   Frequent falls   Hypokalemia   Hypomagnesemia   Essential hypertension   C. difficile colitis   Acute kidney injury ([place])   Alcohol withdrawal with acute encephalopathy - Blood alcohol level <5 - Improved mentation, he has confusion every now and then. But back to his baseline. - Feels okay, denies any complaints. Await nursing home bed availability on Monday. -No complaints await discharge in a.m. to SNF  C. difficile colitis -Diarrhea which is positive for C. difficile by PCR. -Currently on oral vancomycin 125 mg 4 times a day. -Continue current antibiotics, no PPIs and add Florastor.  Acute kidney injury -Creatinine increased from 0.94 to 2.27, BUN  elevated at 40. -This is resolved.   Possible seizure - Reported by the wife to EDP - EEG negative - Likely in the setting of alcohol withdrawal, does not need AED  Essential hypertension - Continue Coreg  Elevated LFTs/fatty liver - Secondary to alcohol abuse. - LFTs currently stable, continue to monitor CMP - AST and bilirubin improving - Given ongoing confusion ammonia checked 4/9 and was normal at 25  Tachycardia, sinus - Continue Coreg - worsened by alcohol withdrawal  Hypomagnesemia/hypokalemia - replete as needed  Frequent falls/physical deconditioning - x-ray of the ribs and chest which was negative, CT of the head/maxillofacial which showed no acute intracranial findings, CT of the cervical spine which was negative for acute fractures - today 4/11 patient tried to go to the bathroom by himself and had a fall. No head injury.  - he tells me that he wants to go home rather than SNF.  - PT recommending SNF. Discussed with his wife [name] who thinks also he should be in a rehab for some time. Discussed with SW  Depression/Anxiety -Continue bupropion, Celexa -Home ativan and Xanax held -Placed on CIWA protocol  Eye discharge - with bilateral crusting, local care, cipro drops - improving   DVT prophylaxis: Lovenox Code Status: Full code Family Communication: No family at bedside Disposition Plan: SNF 2-3 days Barriers for discharge: Alcohol withdrawal, mental status changes  Consultants:   None  Procedures:   None   Antimicrobials:  None    Objective: Filed Vitals:   04/02/16 0500 04/02/16 1704 04/02/16 2114 04/03/16 0621  BP: 132/84 154/99 116/77 151/94  Pulse: 76 85 86  77  Temp:  98.3 F (36.8 C) 98 F (36.7 C) 98.3 F (36.8 C)  TempSrc:  Oral Oral Oral  Resp:  16 16 16   Height:      Weight:      SpO2:  95% 95% 100%    Intake/Output Summary (Last 24 hours) at 04/03/16 1451 Last data filed at 04/03/16 [id number]  Gross per 24 hour  Intake       0 ml  Output    550 ml  Net   -550 ml   Filed Weights   03/31/16 0431 04/01/16 0500 04/01/16 0817  Weight: 86.4 kg (190 lb 7.6 oz) 87.2 kg (192 lb 3.9 oz) 88.8 kg (195 lb 12.3 oz)    Examination: Constitutional: Mildly tremulous Filed Vitals:   04/02/16 0500 04/02/16 1704 04/02/16 2114 04/03/16 0621  BP: 132/84 154/99 116/77 151/94  Pulse: 76 85 86 77  Temp:  98.3 F (36.8 C) 98 F (36.7 C) 98.3 F (36.8 C)  TempSrc:  Oral Oral Oral  Resp:  16 16 16   Height:      Weight:      SpO2:  95% 95% 100%  Eyes: PERRL, periorbital ecchymoses, improving ENMT: Mucous membranes are moist.  Respiratory: clear to auscultation bilaterally, no wheezing, no crackles.  Cardiovascular: Regular rate and rhythm, no murmurs / rubs / gallops. Abdomen: no tenderness Bowel sounds positive.    Data Reviewed: I have personally reviewed following labs and imaging studies  CBC:  Recent Labs Lab 03/28/16 0526  WBC 7.9  HGB 13.4  HCT 37.4*  MCV 102.2*  PLT [id number]   Basic Metabolic Panel:  Recent Labs Lab 03/30/16 0524 03/31/16 0758 04/01/16 0530 04/02/16 0527 04/03/16 0553  NA 132* 135 141 138 141  K 4.3 4.2 4.3 4.0 4.1  CL 100* 104 109 106 108  CO2 23 22 23 24  21*  GLUCOSE 124* 138* 105* 105* 108*  BUN 40* 32* 20 15 14   CREATININE 2.27*  2.22* 1.52* 1.13 1.10 1.00  CALCIUM 9.7 9.2 9.8 9.3 9.5   GFR: Estimated Creatinine Clearance: 79.3 mL/min (by C-G formula based on Cr of 1). Liver Function Tests:  Recent Labs Lab 03/28/16 0526  AST 78*  ALT 61  ALKPHOS 86  BILITOT 1.4*  PROT 7.2  ALBUMIN 3.7   Urine analysis:    Component Value Date/Time   COLORURINE ORANGE* 03/23/2016 1801   APPEARANCEUR CLOUDY* 03/23/2016 1801   LABSPEC 1.028 03/23/2016 1801   PHURINE 6.0 03/23/2016 1801   GLUCOSEU NEGATIVE 03/23/2016 1801   HGBUR NEGATIVE 03/23/2016 1801   BILIRUBINUR LARGE* 03/23/2016 1801   KETONESUR 40* 03/23/2016 1801   PROTEINUR 30* 03/23/2016 1801   UROBILINOGEN  2.0* 04/02/2015 2013   NITRITE POSITIVE* 03/23/2016 1801   LEUKOCYTESUR SMALL* 03/23/2016 1801   Sepsis Labs: Invalid input(s): PROCALCITONIN, LACTICIDVEN  No results found for this or any previous visit (from the past 240 hour(s)).  Radiology Studies: No results found. Scheduled Meds: . aspirin EC  81 mg Oral Daily  . atorvastatin  40 mg Oral q1800  . buPROPion  100 mg Oral BID  . carvedilol  3.125 mg Oral BID WC  . cholecalciferol  2,000 Units Oral Daily  . ciprofloxacin  1 drop Both Eyes Q4H while awake  . citalopram  20 mg Oral QHS  . enoxaparin (LOVENOX) injection  40 mg Subcutaneous Q24H  . folic acid  1 mg Oral Daily  . lactose free nutrition  237 mL Oral TID WC  .  magnesium oxide  400 mg Oral QHS  . multivitamin with minerals  1 tablet Oral Daily  . omega-3 acid ethyl esters  1 g Oral Daily  . potassium chloride SA  20 mEq Oral Daily  . tamsulosin  0.4 mg Oral Daily  . thiamine  100 mg Oral Daily  . vancomycin  125 mg Oral 4 times per day   Continuous Infusions: . sodium chloride 100 mL/hr at 04/03/16 1016    Tramain Gershman A, MD [facility] Pager [phone]  If 7PM-7AM, please contact night-coverage [email or web address] Password [email or web address] 04/03/2016, 2:51 PM

## 2016-04-04 LAB — BASIC METABOLIC PANEL
ANION GAP: 8 (ref 5–15)
BUN: 13 mg/dL (ref 4–21)
BUN: 13 mg/dL (ref 6–20)
CO2: 22 mmol/L (ref 22–32)
CREATININE: 0.9 mg/dL (ref 0.6–1.3)
Calcium: 9.2 mg/dL (ref 8.9–10.3)
Chloride: 111 mmol/L (ref 101–111)
Creatinine, Ser: 0.87 mg/dL (ref 0.61–1.24)
Glucose, Bld: 102 mg/dL — ABNORMAL HIGH (ref 65–99)
Glucose: 102 mg/dL
POTASSIUM: 4.1 mmol/L (ref 3.5–5.1)
SODIUM: 141 mmol/L (ref 135–145)
Sodium: 141 mmol/L (ref 137–147)

## 2016-04-04 LAB — MAGNESIUM: MAGNESIUM: 1.8 mg/dL (ref 1.7–2.4)

## 2016-04-04 MED ORDER — LORAZEPAM 1 MG PO TABS
1.0000 mg | ORAL_TABLET | ORAL | Status: AC
  Administered 2016-04-04: 1 mg via ORAL

## 2016-04-04 MED ORDER — ALPRAZOLAM 0.5 MG PO TABS: 0.5000 mg | ORAL_TABLET | Freq: Two times a day (BID) | ORAL | Status: DC | PRN

## 2016-04-04 MED ORDER — VANCOMYCIN HCL 125 MG PO CAPS: 125.0000 mg | ORAL_CAPSULE | Freq: Four times a day (QID) | ORAL | Status: DC

## 2016-04-04 NOTE — Progress Notes (Signed)
Pt for discharge to [facility].   CSW facilitated pt discharge needs including contacting facility, faxing pt discharge information via epic hub, discussing with pt and pt significant other at bedside, pt is agreeable to transition to [facility] for rehab, providing RN phone number to call report, and arranging ambulance transport for pt to [facility].   No further social work needs identified at this time.  CSW signing off.   Alison Murray, MSW, LCSW Clinical Social Work 5E coverage  [phone]

## 2016-04-04 NOTE — Progress Notes (Signed)
Nutrition Follow-up  DOCUMENTATION CODES:   Not applicable  INTERVENTION:  - Continue Boost Plus TID - RD will continue to monitor for additional needs if pt unable to d/c today  NUTRITION DIAGNOSIS:   Inadequate oral intake related to poor appetite as evidenced by per patient/family report. -ongoing  GOAL:   Patient will meet greater than or equal to 90% of their needs -unmet  MONITOR:   PO intake, Supplement acceptance, Weight trends, Labs, Skin, I & O's  ASSESSMENT:   Joshua Henson is a 70 yo male With a history of hypertension, tachycardia, alcoholic liver disease, that presented to the emergency department with complaints of generalized weakness and falls.  4/17 Per MD note yesterday PM, RN and MD this AM plan is for d/c to SNF today. Per chart review, pt consuming 25-50% of meals since previous assessment. Pt's mentation has been improving and he is now at baseline. Pt not fully meeting needs at this time; will continue to monitor for further needs if pt is unable to d/c today. Medications reviewed. Labs reviewed.    4/11 - No intakes documented since previous assessment.  - Physical assessment was completed on 4/6 and showed no signs of muscle or fat wasting, no edema.  - No new weight since that time.  - Pt continues to be confused and was sleeping at time of visit this AM with no family/visitors present.  - Spoke with tech outside of pt's room who reports ~25% completion of breakfast and that pt drank ~240 ounces of liquid at that time.  - Boost Plus is currently ordered TID.   4/6 - He complains of poor appetite for 4-5 days PTA. "I didn't really eat much." - He denies weight loss.  - Patient has bruising across his eyes and nose, all down his right side. - 100% completion of meal tray present on bedside table.  - He had one bottle of boost so far, liked it, will continue to provide during stay.    Diet Order:  Diet Heart Room service appropriate?: Yes;  Fluid consistency:: Thin  Skin:  Wound (see comment) (Multiple ecchymosis)  Last BM:  4/15  Height:   Ht Readings from Last 1 Encounters:  03/30/16 5\' 10"  (1.778 m)    Weight:   Wt Readings from Last 1 Encounters:  04/01/16 195 lb 12.3 oz (88.8 kg)    Ideal Body Weight:  75.45 kg  BMI:  Body mass index is 28.09 kg/(m^2).  Estimated Nutritional Needs:   Kcal:  2000-2400 calories  Protein:  90-110 grams  Fluid:  1.2L  EDUCATION NEEDS:   No education needs identified at this time     Jarome Matin, RD, LDN Inpatient Clinical Dietitian Pager # [phone] After hours/weekend pager # [phone]

## 2016-04-04 NOTE — Progress Notes (Signed)
Physical Therapy Treatment Patient Details Name: NETHANIEL CIFUENTES MRN: [id number] DOB: 12-18-1946 Today's Date: 04/04/2016    History of Present Illness 70 yo male admitted 03/23/16 With a history of hypertension, tachycardia, alcoholic liver disease, that presented to the emergency department with complaints of generalized weakness and falls.  wife stated the patient had brief period of stiffness and shaking this morning.     PT Comments    Progressing slowly with mobility. Pt is motivated to mobilize more. Recommend daily ambulation with nursing assist and RW, if possible. Continue to recommend ST rehab at [facility] for strength and balance training. Poor balance reactions noted even with use of walker.  Follow Up Recommendations  SNF     Equipment Recommendations  None recommended by PT    Recommendations for Other Services       Precautions / Restrictions Precautions Precautions: Fall Restrictions Weight Bearing Restrictions: No    Mobility  Bed Mobility Overal bed mobility: Needs Assistance Bed Mobility: Supine to Sit;Sit to Supine     Supine to sit: Supervision Sit to supine: Supervision   General bed mobility comments: close supervision.  Transfers Overall transfer level: Needs assistance Equipment used: Rolling walker (2 wheeled) Transfers: Sit to/from Stand Sit to Stand: Min assist;From elevated surface;Mod assist         General transfer comment: Assist to rise, stabilize, control descent. Mod assist to control descent-pt attempts to sit before safely positioned in front of surface despite cueing.   Ambulation/Gait Ambulation/Gait assistance: Min assist Ambulation Distance (Feet): 150 Feet Assistive device: Rolling walker (2 wheeled) Gait Pattern/deviations: Step-through pattern;Decreased stride length     General Gait Details: Assist to stabilize and maneuver safely with walker. Very unsteady and remains at risk for falls. Mod cues for safety. LOB posteriorly  x 2 even with RW use. Unsafe walker use at times.   Stairs            Wheelchair Mobility    Modified Rankin (Stroke Patients Only)       Balance           Standing balance support: Bilateral upper extremity supported;During functional activity Standing balance-Leahy Scale: Poor                      Cognition Arousal/Alertness: Awake/alert Behavior During Therapy: WFL for tasks assessed/performed Overall Cognitive Status: Impaired/Different from baseline Area of Impairment: Safety/judgement;Problem solving         Safety/Judgement: Decreased awareness of deficits;Decreased awareness of safety   Problem Solving: Requires tactile cues;Requires verbal cues      Exercises      General Comments        Pertinent Vitals/Pain Pain Assessment: No/denies pain    Home Living                      Prior Function            PT Goals (current goals can now be found in the care plan section) Progress towards PT goals: Progressing toward goals    Frequency  Min 3X/week    PT Plan Current plan remains appropriate    Co-evaluation             End of Session Equipment Utilized During Treatment: Gait belt Activity Tolerance: Patient tolerated treatment well Patient left: with call bell/phone within reach;in bed;with bed alarm set     Time: [id number] PT Time Calculation (min) (ACUTE ONLY): 10 min  Charges:  $Gait  Training: 8-22 mins                    G Codes:      Weston Anna, MPT Pager: [phone]

## 2016-04-04 NOTE — Discharge Summary (Signed)
Physician Discharge Summary  DAMARRIUS Henson [id number] DOB: 01-Jul-1946 DOA: 03/23/2016  PCP: Mayra Neer, MD  Admit date: 03/23/2016 Discharge date: 04/04/2016  Time spent: 40 minutes  Recommendations for Outpatient Follow-up:  1. Follow up with nursing home M.D. 2. Continue oral vancomycin 125 mg QID for 4 more days then stop, end date is 04/08/2016.   Discharge Diagnoses:  Principal Problem:   Alcohol withdrawal ([place]) Active Problems:   Alcohol abuse   Fatty liver, alcoholic   Frequent falls   Hypokalemia   Hypomagnesemia   Essential hypertension   C. difficile colitis   Acute kidney injury [facility])   Discharge Condition: Stable  Diet recommendation: Heart healthy  Filed Weights   03/31/16 0431 04/01/16 0500 04/01/16 0817  Weight: 86.4 kg (190 lb 7.6 oz) 87.2 kg (192 lb 3.9 oz) 88.8 kg (195 lb 12.3 oz)    History of present illness:  Joshua Henson is a 70 y.o. male  With a history of hypertension, tachycardia, alcoholic liver disease, that presented to the emergency department with complaints of generalized weakness and falls. Patient was recently seen in the emergency department on 03/23/2015 after sustaining several falls. Patient was not admitted at that time. He cannot go on his last alcohol consumption was. He does state that he has been having pain with deep breathing and movement as well as cough. Per reports from [place], wife stated the patient had brief period of stiffness and shaking this morning. No loss of bowel or bladder control, no tongue biting. In the ER, patient was found to be tachycardic. Potassium 3.1, magnesium 1.5. TRH called for admission.   Hospital Course:   Alcohol withdrawal with acute encephalopathy - Blood alcohol level <5 - Improved mentation, he has confusion every now and then. But back to his baseline. - Feels okay, denies any complaints. No recent complains consistent with alcohol withdrawal. This is resolved. - Patient has bouts of  confabulations every now and then, continues B12 and folate.  C. difficile colitis -Diarrhea which is positive for C. difficile by PCR. -Currently on oral vancomycin 125 mg 4 times a day. -Continue current antibiotics, no PPIs and add Florastor.  Acute kidney injury -Creatinine increased from 0.94 to 2.27, BUN elevated at 40. -This is resolved, after treatment with IV fluids.  Possible seizure - Reported by the wife to EDP - EEG negative - Likely in the setting of alcohol withdrawal, does not need AED  Essential hypertension - Continue Coreg  Elevated LFTs/fatty liver - Secondary to alcohol abuse. - LFTs currently stable, continue to monitor CMP - AST and bilirubin improving - Given ongoing confusion ammonia checked 4/9 and was normal at 25  Tachycardia, sinus - Continue Coreg - worsened by alcohol withdrawal  Hypomagnesemia/hypokalemia - replete as needed  Frequent falls/physical deconditioning - x-ray of the ribs and chest which was negative, CT of the head/maxillofacial which showed no acute intracranial findings, CT of the cervical spine which was negative for acute fractures - 4/11 patient tried to go to the bathroom by himself and had a fall. No head injury.  - Has had long history of falls and injuries secondary to that. - Falls being secondary to multiple reasons including Mnire's disease, orthostatic hypotension and alcohol intoxication. - Currently okay denies complaints, SNF for rehabilitation.  Depression/Anxiety -Continue bupropion, Celexa -On both Xanax and Ativan at home, Ativan discontinued, Xanax frequency increase to twice a day instead of once a day.  Eye discharge - with bilateral crusting, local care, cipro  drops - improving  History of Mnire disease -Patient has history of vertigo, hearing loss and tinnitus. -Being treated for Mnire's disease.   Procedures:  None  Consultations:  None  Discharge Exam: Filed Vitals:    04/03/16 2339 04/04/16 0600  BP:  160/80  Pulse: 69 70  Temp: 98.2 F (36.8 C) 98.2 F (36.8 C)  Resp: 16 20   General: Alert and awake, oriented to self and place not to time, has bouts of confabulations. HEENT: anicteric sclera, pupils reactive to light and accommodation, EOMI CVS: S1-S2 clear, no murmur rubs or gallops Chest: clear to auscultation bilaterally, no wheezing, rales or rhonchi Abdomen: soft nontender, nondistended, normal bowel sounds, no organomegaly Extremities: no cyanosis, clubbing or edema noted bilaterally Neuro: Cranial nerves II-XII intact, no focal neurological deficits  Discharge Instructions   Discharge Instructions    Diet - low sodium heart healthy    Complete by:  As directed      Increase activity slowly    Complete by:  As directed           Current Discharge Medication List    START taking these medications   Details  vancomycin (VANCOCIN) 125 MG capsule Take 1 capsule (125 mg total) by mouth 4 (four) times daily.      CONTINUE these medications which have CHANGED   Details  ALPRAZolam (XANAX) 0.5 MG tablet Take 1 tablet (0.5 mg total) by mouth 2 (two) times daily as needed for anxiety. Qty: 10 tablet, Refills: 0      CONTINUE these medications which have NOT CHANGED   Details  aspirin 81 MG EC tablet Take 1 tablet (81 mg total) by mouth daily. Qty: 30 tablet, Refills: 12    atorvastatin (LIPITOR) 40 MG tablet Take 1 tablet (40 mg total) by mouth daily at 6 PM. Qty: 30 tablet, Refills: 0    buPROPion (WELLBUTRIN) 100 MG tablet Take 100 mg by mouth 2 (two) times daily. Refills: 0    carvedilol (COREG) 6.25 MG tablet Take 1 tablet (6.25 mg total) by mouth 2 (two) times daily with a meal. Qty: 60 tablet, Refills: 0    cholecalciferol (VITAMIN D) 1000 units tablet Take 2,000 Units by mouth daily.    CIALIS 20 MG tablet Take 20 mg by mouth as needed for erectile dysfunction.  Refills: 0    citalopram (CELEXA) 40 MG tablet Take 1  tablet (40 mg total) by mouth at bedtime. Qty: 30 tablet, Refills: 0    Coenzyme Q10 (CO Q-10) 200 MG CAPS Take 200 mg by mouth daily.    folic acid (FOLVITE) 1 MG tablet Take 1 tablet (1 mg total) by mouth daily. Qty: 30 tablet, Refills: 1    Magnesium 500 MG CAPS Take 500 mg by mouth at bedtime.    Omega-3 Fatty Acids (FISH OIL) 1000 MG CAPS Take 1,000 mg by mouth 2 (two) times daily.     tamsulosin (FLOMAX) 0.4 MG CAPS capsule Take 0.4 mg by mouth daily.  Refills: 0    thiamine (VITAMIN B-1) 100 MG tablet Take 100 mg by mouth daily.      STOP taking these medications     HYDROcodone-acetaminophen (NORCO) 10-325 MG tablet      LORazepam (ATIVAN) 1 MG tablet      potassium chloride SA (K-DUR,KLOR-CON) 20 MEQ tablet      triamterene-hydrochlorothiazide (DYAZIDE) 37.5-25 MG capsule        Allergies  Allergen Reactions  . Lisinopril  syncope  . Celebrex [Celecoxib] Rash      The results of significant diagnostics from this hospitalization (including imaging, microbiology, ancillary and laboratory) are listed below for reference.    Significant Diagnostic Studies: Dg Ribs Bilateral W/chest  03/23/2016  CLINICAL DATA:  several falls recently. Was [facility] at [facility] Monday for same. Pt is an alcoholic. Requesting detox. Denies any etoh today. EMS reports he is hypotensive and tachycardic at 130. Hx prostate cancer. Hx HTN. Hx CAD. Smoker. BB not placed for images because pt states generalized chest and rib pain on anterior right and left sides. EXAM: BILATERAL RIBS AND CHEST - 4+ VIEW COMPARISON:  02/16/2016 FINDINGS: No fracture or other bone lesions are seen involving the ribs. There is no evidence of pneumothorax or pleural effusion. Both lungs are clear. Heart size and mediastinal contours are within normal limits. Tortuous thoracic aorta. IMPRESSION: Negative. Electronically Signed   By: Lucrezia Europe M.D.   On: 03/23/2016 13:08   Ct Head Wo Contrast  03/22/2016  CLINICAL DATA:   Pain EXAM: CT HEAD WITHOUT CONTRAST CT MAXILLOFACIAL WITHOUT CONTRAST CT CERVICAL SPINE WITHOUT CONTRAST TECHNIQUE: Multidetector CT imaging of the head, cervical spine, and maxillofacial structures were performed using the standard protocol without intravenous contrast. Multiplanar CT image reconstructions of the cervical spine and maxillofacial structures were also generated. COMPARISON:  None. FINDINGS: CT HEAD FINDINGS There is no intracranial hemorrhage, mass or evidence of acute infarction. There is mild generalized atrophy. There is mild chronic microvascular ischemic change. There is no significant extra-axial fluid collection. No acute intracranial findings are evident. No acute bony abnormality is evident. CT MAXILLOFACIAL FINDINGS Orbits are unremarkable. No maxillofacial fractures. 2 cm retention cyst in the left maxillary antrum, and membrane thickening in the floor of the right maxillary sinus. Frontal sinuses, ethmoid air cells and sphenoid sinuses are clear. Mastoid air cells are clear. CT CERVICAL SPINE FINDINGS The vertebral column, pedicles and facet articulations are intact. There is no evidence of acute fracture. No acute soft tissue abnormalities are evident. Moderately severe cervical degenerative disc disease is present at C5-6. Mild-to-moderate mid cervical facet arthritis is present. No bone lesion or bony destruction. No evidence of acute fracture. No acute soft tissue abnormality. IMPRESSION: 1. No acute intracranial findings. There is mild generalized atrophy and chronic white matter hypodensity which likely represents small vessel disease. 2. Left maxillary antrum retention cyst. Mild membrane thickening in the floor of the right maxillary sinus. Negative for acute maxillofacial fracture. Next 3. Negative for acute cervical spine fracture. Moderately severe degenerative disc changes at C5-6. Electronically Signed   By: Andreas Newport M.D.   On: 03/22/2016 01:56   Ct Cervical Spine  Wo Contrast  03/22/2016  CLINICAL DATA:  Pain EXAM: CT HEAD WITHOUT CONTRAST CT MAXILLOFACIAL WITHOUT CONTRAST CT CERVICAL SPINE WITHOUT CONTRAST TECHNIQUE: Multidetector CT imaging of the head, cervical spine, and maxillofacial structures were performed using the standard protocol without intravenous contrast. Multiplanar CT image reconstructions of the cervical spine and maxillofacial structures were also generated. COMPARISON:  None. FINDINGS: CT HEAD FINDINGS There is no intracranial hemorrhage, mass or evidence of acute infarction. There is mild generalized atrophy. There is mild chronic microvascular ischemic change. There is no significant extra-axial fluid collection. No acute intracranial findings are evident. No acute bony abnormality is evident. CT MAXILLOFACIAL FINDINGS Orbits are unremarkable. No maxillofacial fractures. 2 cm retention cyst in the left maxillary antrum, and membrane thickening in the floor of the right maxillary sinus. Frontal sinuses, ethmoid  air cells and sphenoid sinuses are clear. Mastoid air cells are clear. CT CERVICAL SPINE FINDINGS The vertebral column, pedicles and facet articulations are intact. There is no evidence of acute fracture. No acute soft tissue abnormalities are evident. Moderately severe cervical degenerative disc disease is present at C5-6. Mild-to-moderate mid cervical facet arthritis is present. No bone lesion or bony destruction. No evidence of acute fracture. No acute soft tissue abnormality. IMPRESSION: 1. No acute intracranial findings. There is mild generalized atrophy and chronic white matter hypodensity which likely represents small vessel disease. 2. Left maxillary antrum retention cyst. Mild membrane thickening in the floor of the right maxillary sinus. Negative for acute maxillofacial fracture. Next 3. Negative for acute cervical spine fracture. Moderately severe degenerative disc changes at C5-6. Electronically Signed   By: Andreas Newport M.D.    On: 03/22/2016 01:56   Ct Maxillofacial Wo Cm  03/22/2016  CLINICAL DATA:  Pain EXAM: CT HEAD WITHOUT CONTRAST CT MAXILLOFACIAL WITHOUT CONTRAST CT CERVICAL SPINE WITHOUT CONTRAST TECHNIQUE: Multidetector CT imaging of the head, cervical spine, and maxillofacial structures were performed using the standard protocol without intravenous contrast. Multiplanar CT image reconstructions of the cervical spine and maxillofacial structures were also generated. COMPARISON:  None. FINDINGS: CT HEAD FINDINGS There is no intracranial hemorrhage, mass or evidence of acute infarction. There is mild generalized atrophy. There is mild chronic microvascular ischemic change. There is no significant extra-axial fluid collection. No acute intracranial findings are evident. No acute bony abnormality is evident. CT MAXILLOFACIAL FINDINGS Orbits are unremarkable. No maxillofacial fractures. 2 cm retention cyst in the left maxillary antrum, and membrane thickening in the floor of the right maxillary sinus. Frontal sinuses, ethmoid air cells and sphenoid sinuses are clear. Mastoid air cells are clear. CT CERVICAL SPINE FINDINGS The vertebral column, pedicles and facet articulations are intact. There is no evidence of acute fracture. No acute soft tissue abnormalities are evident. Moderately severe cervical degenerative disc disease is present at C5-6. Mild-to-moderate mid cervical facet arthritis is present. No bone lesion or bony destruction. No evidence of acute fracture. No acute soft tissue abnormality. IMPRESSION: 1. No acute intracranial findings. There is mild generalized atrophy and chronic white matter hypodensity which likely represents small vessel disease. 2. Left maxillary antrum retention cyst. Mild membrane thickening in the floor of the right maxillary sinus. Negative for acute maxillofacial fracture. Next 3. Negative for acute cervical spine fracture. Moderately severe degenerative disc changes at C5-6. Electronically  Signed   By: Andreas Newport M.D.   On: 03/22/2016 01:56    Microbiology: No results found for this or any previous visit (from the past 240 hour(s)).   Labs: Basic Metabolic Panel:  Recent Labs Lab 03/31/16 0758 04/01/16 0530 04/02/16 0527 04/03/16 0553 04/04/16 0529  NA 135 141 138 141 141  K 4.2 4.3 4.0 4.1 4.1  CL 104 109 106 108 111  CO2 22 23 24  21* 22  GLUCOSE 138* 105* 105* 108* 102*  BUN 32* 20 15 14 13   CREATININE 1.52* 1.13 1.10 1.00 0.87  CALCIUM 9.2 9.8 9.3 9.5 9.2  MG  --   --   --   --  1.8   Liver Function Tests: No results for input(s): AST, ALT, ALKPHOS, BILITOT, PROT, ALBUMIN in the last 168 hours. No results for input(s): LIPASE, AMYLASE in the last 168 hours. No results for input(s): AMMONIA in the last 168 hours. CBC: No results for input(s): WBC, NEUTROABS, HGB, HCT, MCV, PLT in the last 168  hours. Cardiac Enzymes: No results for input(s): CKTOTAL, CKMB, CKMBINDEX, TROPONINI in the last 168 hours. BNP: BNP (last 3 results) No results for input(s): BNP in the last 8760 hours.  ProBNP (last 3 results) No results for input(s): PROBNP in the last 8760 hours.  CBG: No results for input(s): GLUCAP in the last 168 hours.     Signed:  Birdie Hopes MD.  [facility] 04/04/2016, 11:28 AM

## 2016-04-04 NOTE — Clinical Social Work Placement (Signed)
   CLINICAL SOCIAL WORK PLACEMENT  NOTE  Date:  04/04/2016  Patient Details  Name: Joshua Henson MRN: [id number] Date of Birth: 1945/12/22  Clinical Social Work is seeking post-discharge placement for this patient at the [place] level of care (*CSW will initial, date and re-position this form in  chart as items are completed):  Yes   Patient/family provided with [facility] Work Department's list of facilities offering this level of care within the geographic area requested by the patient (or if unable, by the patient's family).  Yes   Patient/family informed of their freedom to choose among providers that offer the needed level of care, that participate in Medicare, Medicaid or managed care program needed by the patient, have an available bed and are willing to accept the patient.  Yes   Patient/family informed of [facility]'s ownership interest in [facility] and [facility], as well as of the fact that they are under no obligation to receive care at these facilities.  PASRR submitted to EDS on 03/25/16     PASRR number received on 03/25/16     Existing PASRR number confirmed on       FL2 transmitted to all facilities in geographic area requested by pt/family on       FL2 transmitted to all facilities within larger geographic area on 03/26/16     Patient informed that his/her managed care company has contracts with or will negotiate with certain facilities, including the following:        Yes   Patient/family informed of bed offers received.  Patient chooses bed at [facility]     Physician recommends and patient chooses bed at      Patient to be transferred to [facility] on 04/04/16.  Patient to be transferred to facility by ambulance [facility])     Patient family notified on 04/04/16 of transfer.  Name of family member notified:  pt and pt significant other, Michelle notified at bedside     PHYSICIAN Please sign FL2      Additional Comment:    _______________________________________________ Ladell Pier, LCSW 04/04/2016, 1:02 PM

## 2016-04-04 NOTE — Progress Notes (Signed)
Gave report to Kathlee Nations, [employer] or nursing at [facility]. Left number if she had additional questions.

## 2016-04-05 ENCOUNTER — Non-Acute Institutional Stay (SKILLED_NURSING_FACILITY): Payer: Medicare Other | Admitting: Internal Medicine

## 2016-04-05 ENCOUNTER — Encounter: Payer: Self-pay | Admitting: Internal Medicine

## 2016-04-05 DIAGNOSIS — F419 Anxiety disorder, unspecified: Secondary | ICD-10-CM | POA: Diagnosis not present

## 2016-04-05 DIAGNOSIS — N400 Enlarged prostate without lower urinary tract symptoms: Secondary | ICD-10-CM

## 2016-04-05 DIAGNOSIS — E785 Hyperlipidemia, unspecified: Secondary | ICD-10-CM

## 2016-04-05 DIAGNOSIS — I2510 Atherosclerotic heart disease of native coronary artery without angina pectoris: Secondary | ICD-10-CM

## 2016-04-05 DIAGNOSIS — Z789 Other specified health status: Secondary | ICD-10-CM | POA: Diagnosis not present

## 2016-04-05 DIAGNOSIS — R531 Weakness: Secondary | ICD-10-CM | POA: Diagnosis not present

## 2016-04-05 DIAGNOSIS — I10 Essential (primary) hypertension: Secondary | ICD-10-CM | POA: Diagnosis not present

## 2016-04-05 DIAGNOSIS — F329 Major depressive disorder, single episode, unspecified: Secondary | ICD-10-CM

## 2016-04-05 DIAGNOSIS — F1090 Alcohol use, unspecified, uncomplicated: Secondary | ICD-10-CM

## 2016-04-05 DIAGNOSIS — F32A Depression, unspecified: Secondary | ICD-10-CM

## 2016-04-05 DIAGNOSIS — A047 Enterocolitis due to Clostridium difficile: Secondary | ICD-10-CM | POA: Diagnosis not present

## 2016-04-05 DIAGNOSIS — Z7289 Other problems related to lifestyle: Secondary | ICD-10-CM

## 2016-04-05 DIAGNOSIS — A0472 Enterocolitis due to Clostridium difficile, not specified as recurrent: Secondary | ICD-10-CM

## 2016-04-05 NOTE — Progress Notes (Signed)
LOCATION: [place]  PCP: Mayra Neer, MD   Code Status: Full Code  Goals of care: Advanced Directive information Advanced Directives 03/23/2016  Does patient have an advance directive? No  Type of Advance Directive -  Would patient like information on creating an advanced directive? No - patient declined information       Extended Emergency Contact Information Primary Emergency Contact: [place] Phone: [phone] Relation: Significant other   Allergies  Allergen Reactions  . Lisinopril     syncope  . Celebrex [Celecoxib] Rash    Chief Complaint  Patient presents with  . New Admit To SNF    New Admission     HPI:  Patient is a 70 y.o. male seen today for short term rehabilitation post hospital admission from 03/23/16-04/04/16 post fall with alcohol withdrawal and c.diff colitis. He was started on CIWA protocol and po vancomycin. He had AKI and received iv fluids. Fracture and acute intracranial abnormality was ruled out. He has PMH of alcohol use, Meniere disease, CAD, HTN among others. He is seen in his room today.   Review of Systems:  Constitutional: Negative for fever, chills. Feels weak and tired. HENT: Negative for headache, congestion, nasal discharge. Has hearing loss. Wears hearing aids.   Eyes: Negative for blurred vision, double vision and discharge.  Respiratory: Negative for cough, shortness of breath and wheezing.   Cardiovascular: Negative for chest pain, palpitations, leg swelling. positive for Chest wall soreness. Gastrointestinal: Negative for heartburn, nausea, vomiting, abdominal pain. Last bowel movement was today.   Genitourinary: Negative for dysuria and flank pain.  Musculoskeletal: Negative for back pain, fall.  Skin: Negative for itching, rash.  Neurological: Negative for dizziness. Psychiatric/Behavioral: Negative for depression.   Past Medical History  Diagnosis Date  . Prostate cancer  ([place]) 2010    External beam radiation (urol - Risa Grill, XRT Valere Dross)  . Stroke ([place])   . Depression   . Tachycardia   . Hypertension   . Insomnia   . CAD (coronary artery disease), native coronary artery 01/10/2016    3 vessel coronary calcification noted on CT scan 04/03/15    . Fatty liver, alcoholic [id number]   Past Surgical History  Procedure Laterality Date  . None    . Cardiac catheterization N/A 01/15/2016    Procedure: Left Heart Cath and Coronary Angiography;  Surgeon: Burnell Blanks, MD;  Location: [facility];  Service: Cardiovascular;  Laterality: N/A;   Social History:   reports that he has been smoking Cigarettes.  He has a 15 pack-year smoking history. He has never used smokeless tobacco. He reports that he drinks about 33.6 oz of alcohol per week. He reports that he does not use illicit drugs.  Family History  Problem Relation Age of Onset  . Cancer Mother     esophageal  . Hypertension Mother   . Diabetes Mother   . Cancer Father     prostate  . Heart disease Maternal Grandfather     MI  . Cancer Paternal Grandfather     prostate    Medications:   Medication List       This list is accurate as of: 04/05/16  4:48 PM.  Always use your most recent med list.               ALPRAZolam 0.5 MG tablet  Commonly known as:  XANAX  Take 1 tablet (0.5 mg total) by mouth 2 (two) times  daily as needed for anxiety.     aspirin 81 MG EC tablet  Take 1 tablet (81 mg total) by mouth daily.     atorvastatin 40 MG tablet  Commonly known as:  LIPITOR  Take 1 tablet (40 mg total) by mouth daily at 6 PM.     buPROPion 100 MG tablet  Commonly known as:  WELLBUTRIN  Take 100 mg by mouth 2 (two) times daily.     carvedilol 6.25 MG tablet  Commonly known as:  COREG  Take 6.25 mg by mouth 2 (two) times daily with a meal.     cholecalciferol 1000 units tablet  Commonly known as:  VITAMIN D  Take 2,000 Units by mouth daily.     citalopram 40 MG tablet    Commonly known as:  CELEXA  Take 40 mg by mouth at bedtime.     Co Q-10 200 MG Caps  Take 200 mg by mouth daily.     Fish Oil 1000 MG Caps  Take 1,000 mg by mouth 2 (two) times daily.     folic acid 1 MG tablet  Commonly known as:  FOLVITE  Take 1 tablet (1 mg total) by mouth daily.     LORazepam 1 MG tablet  Commonly known as:  ATIVAN  Take 1 mg by mouth every 6 (six) hours as needed for anxiety.     Magnesium 500 MG Caps  Take 500 mg by mouth at bedtime.     tamsulosin 0.4 MG Caps capsule  Commonly known as:  FLOMAX  Take 0.4 mg by mouth daily.     thiamine 100 MG tablet  Commonly known as:  VITAMIN B-1  Take 100 mg by mouth daily.     vancomycin 125 MG capsule  Commonly known as:  VANCOCIN  Take 125 mg by mouth every 6 (six) hours.        Immunizations: Immunization History  Administered Date(s) Administered  . Influenza,inj,Quad PF,36+ Mos 09/04/2014  . PPD Test 04/04/2016  . Pneumococcal-Unspecified 07/01/2014     Physical Exam: Filed Vitals:   04/05/16 1641  BP: 113/76  Pulse: 82  Temp: 98.4 F (36.9 C)  TempSrc: Oral  Resp: 18  Height: 5\' 10"  (1.778 m)  Weight: 195 lb (88.451 kg)  SpO2: 98%   Body mass index is 27.98 kg/(m^2).  General- elderly male, well built, in no acute distress Head- normocephalic, atraumatic Nose- no maxillary or frontal sinus tenderness, no nasal discharge Throat- moist mucus membrane Eyes- PERRLA, EOMI, no pallor, no icterus, no discharge, normal conjunctiva, normal sclera Neck- no cervical lymphadenopathy Cardiovascular- normal s1,s2, no murmur Respiratory- bilateral clear to auscultation, no wheeze, no rhonchi, no crackles, no use of accessory muscles Abdomen- bowel sounds present, soft, non tender Musculoskeletal- able to move all 4 extremities, generalized weakness, ankle edema Neurological- alert and oriented to person, place and time Skin- warm and dry, extensive bruise to his face and arms noted, scab to  right side of forehead Psychiatry- normal mood and affect    Labs reviewed: Basic Metabolic Panel:  Recent Labs  01/17/16 0355 01/18/16 0517 01/19/16 0505  03/23/16 1310  03/24/16 0321  04/02/16 0527 04/03/16 0553 04/04/16 04/04/16 0529  NA 138 141 141  < >  --   < > 137  < > 138 141 141 141  K 2.8* 3.1* 3.6  < >  --   < > 3.2*  < > 4.0 4.1  --  4.1  CL 102 106  107  < >  --   < > 105  < > 106 108  --  111  CO2 25 25 24   < >  --   < > 23  < > 24 21*  --  22  GLUCOSE 126* 111* 107*  < >  --   < > 129*  < > 105* 108*  --  102*  BUN 8 10 10   < >  --   < > 13  < > 15 14 13 13   CREATININE 0.85 0.87 0.80  < >  --   < > 0.72  < > 1.10 1.00 0.9 0.87  CALCIUM 8.2* 8.5* 8.6*  < >  --   < > 8.6*  < > 9.3 9.5  --  9.2  MG 1.9 1.8 2.0  --  1.5*  --  1.9  --   --   --   --  1.8  PHOS 3.1 2.4* 3.1  --   --   --   --   --   --   --   --   --   < > = values in this interval not displayed. Liver Function Tests:  Recent Labs  03/24/16 0321 03/25/16 0523 03/28/16 0526  AST 53* 71* 78*  ALT 34 40 61  ALKPHOS 65 76 86  BILITOT 1.6* 1.4* 1.4*  PROT 5.3* 6.4* 7.2  ALBUMIN 2.9* 3.6 3.7   No results for input(s): LIPASE, AMYLASE in the last 8760 hours.  Recent Labs  03/27/16 1219  AMMONIA 25   CBC:  Recent Labs  02/16/16 1907 03/22/16 0030 03/23/16 1311 03/24/16 0321 03/25/16 0523 03/28/16 0526  WBC 5.7 7.3 7.4 4.9 4.7 7.9  NEUTROABS 3.0 5.2 5.9  --   --   --   HGB 12.7* 13.5 11.8* 9.4* 11.3* 13.4  HCT 36.0* 37.5* 32.1* 26.5* 30.9* 37.4*  MCV 98.1 100.5* 100.9* 99.6 103.3* 102.2*  PLT 205 162 141* 138* 167 208   Cardiac Enzymes:  Recent Labs  01/10/16 1712 01/10/16 2322 01/11/16 0236 01/11/16 0528  CKTOTAL 47*  --   --  82  CKMB 1.3  --   --   --   TROPONINI 4.59* 4.53* 4.35* 4.46*   BNP: Invalid input(s): POCBNP CBG:  Recent Labs  01/11/16 1329  GLUCAP 137*    Radiological Exams: Dg Ribs Bilateral W/chest  03/23/2016  CLINICAL DATA:  several falls  recently. Was seen at [facility] Monday for same. Pt is an alcoholic. Requesting detox. Denies any etoh today. EMS reports he is hypotensive and tachycardic at 130. Hx prostate cancer. Hx HTN. Hx CAD. Smoker. BB not placed for images because pt states generalized chest and rib pain on anterior right and left sides. EXAM: BILATERAL RIBS AND CHEST - 4+ VIEW COMPARISON:  02/16/2016 FINDINGS: No fracture or other bone lesions are seen involving the ribs. There is no evidence of pneumothorax or pleural effusion. Both lungs are clear. Heart size and mediastinal contours are within normal limits. Tortuous thoracic aorta. IMPRESSION: Negative. Electronically Signed   By: Lucrezia Europe M.D.   On: 03/23/2016 13:08   Ct Head Wo Contrast  03/22/2016  CLINICAL DATA:  Pain EXAM: CT HEAD WITHOUT CONTRAST CT MAXILLOFACIAL WITHOUT CONTRAST CT CERVICAL SPINE WITHOUT CONTRAST TECHNIQUE: Multidetector CT imaging of the head, cervical spine, and maxillofacial structures were performed using the standard protocol without intravenous contrast. Multiplanar CT image reconstructions of the cervical spine and maxillofacial structures were also generated.  COMPARISON:  None. FINDINGS: CT HEAD FINDINGS There is no intracranial hemorrhage, mass or evidence of acute infarction. There is mild generalized atrophy. There is mild chronic microvascular ischemic change. There is no significant extra-axial fluid collection. No acute intracranial findings are evident. No acute bony abnormality is evident. CT MAXILLOFACIAL FINDINGS Orbits are unremarkable. No maxillofacial fractures. 2 cm retention cyst in the left maxillary antrum, and membrane thickening in the floor of the right maxillary sinus. Frontal sinuses, ethmoid air cells and sphenoid sinuses are clear. Mastoid air cells are clear. CT CERVICAL SPINE FINDINGS The vertebral column, pedicles and facet articulations are intact. There is no evidence of acute fracture. No acute soft tissue abnormalities are  evident. Moderately severe cervical degenerative disc disease is present at C5-6. Mild-to-moderate mid cervical facet arthritis is present. No bone lesion or bony destruction. No evidence of acute fracture. No acute soft tissue abnormality. IMPRESSION: 1. No acute intracranial findings. There is mild generalized atrophy and chronic white matter hypodensity which likely represents small vessel disease. 2. Left maxillary antrum retention cyst. Mild membrane thickening in the floor of the right maxillary sinus. Negative for acute maxillofacial fracture. Next 3. Negative for acute cervical spine fracture. Moderately severe degenerative disc changes at C5-6. Electronically Signed   By: Andreas Newport M.D.   On: 03/22/2016 01:56   Ct Cervical Spine Wo Contrast  03/22/2016  CLINICAL DATA:  Pain EXAM: CT HEAD WITHOUT CONTRAST CT MAXILLOFACIAL WITHOUT CONTRAST CT CERVICAL SPINE WITHOUT CONTRAST TECHNIQUE: Multidetector CT imaging of the head, cervical spine, and maxillofacial structures were performed using the standard protocol without intravenous contrast. Multiplanar CT image reconstructions of the cervical spine and maxillofacial structures were also generated. COMPARISON:  None. FINDINGS: CT HEAD FINDINGS There is no intracranial hemorrhage, mass or evidence of acute infarction. There is mild generalized atrophy. There is mild chronic microvascular ischemic change. There is no significant extra-axial fluid collection. No acute intracranial findings are evident. No acute bony abnormality is evident. CT MAXILLOFACIAL FINDINGS Orbits are unremarkable. No maxillofacial fractures. 2 cm retention cyst in the left maxillary antrum, and membrane thickening in the floor of the right maxillary sinus. Frontal sinuses, ethmoid air cells and sphenoid sinuses are clear. Mastoid air cells are clear. CT CERVICAL SPINE FINDINGS The vertebral column, pedicles and facet articulations are intact. There is no evidence of acute  fracture. No acute soft tissue abnormalities are evident. Moderately severe cervical degenerative disc disease is present at C5-6. Mild-to-moderate mid cervical facet arthritis is present. No bone lesion or bony destruction. No evidence of acute fracture. No acute soft tissue abnormality. IMPRESSION: 1. No acute intracranial findings. There is mild generalized atrophy and chronic white matter hypodensity which likely represents small vessel disease. 2. Left maxillary antrum retention cyst. Mild membrane thickening in the floor of the right maxillary sinus. Negative for acute maxillofacial fracture. Next 3. Negative for acute cervical spine fracture. Moderately severe degenerative disc changes at C5-6. Electronically Signed   By: Andreas Newport M.D.   On: 03/22/2016 01:56   Ct Maxillofacial Wo Cm  03/22/2016  CLINICAL DATA:  Pain EXAM: CT HEAD WITHOUT CONTRAST CT MAXILLOFACIAL WITHOUT CONTRAST CT CERVICAL SPINE WITHOUT CONTRAST TECHNIQUE: Multidetector CT imaging of the head, cervical spine, and maxillofacial structures were performed using the standard protocol without intravenous contrast. Multiplanar CT image reconstructions of the cervical spine and maxillofacial structures were also generated. COMPARISON:  None. FINDINGS: CT HEAD FINDINGS There is no intracranial hemorrhage, mass or evidence of acute infarction. There is mild  generalized atrophy. There is mild chronic microvascular ischemic change. There is no significant extra-axial fluid collection. No acute intracranial findings are evident. No acute bony abnormality is evident. CT MAXILLOFACIAL FINDINGS Orbits are unremarkable. No maxillofacial fractures. 2 cm retention cyst in the left maxillary antrum, and membrane thickening in the floor of the right maxillary sinus. Frontal sinuses, ethmoid air cells and sphenoid sinuses are clear. Mastoid air cells are clear. CT CERVICAL SPINE FINDINGS The vertebral column, pedicles and facet articulations are  intact. There is no evidence of acute fracture. No acute soft tissue abnormalities are evident. Moderately severe cervical degenerative disc disease is present at C5-6. Mild-to-moderate mid cervical facet arthritis is present. No bone lesion or bony destruction. No evidence of acute fracture. No acute soft tissue abnormality. IMPRESSION: 1. No acute intracranial findings. There is mild generalized atrophy and chronic white matter hypodensity which likely represents small vessel disease. 2. Left maxillary antrum retention cyst. Mild membrane thickening in the floor of the right maxillary sinus. Negative for acute maxillofacial fracture. Next 3. Negative for acute cervical spine fracture. Moderately severe degenerative disc changes at C5-6. Electronically Signed   By: Andreas Newport M.D.   On: 03/22/2016 01:56    Assessment/Plan  Generalized weakness Will have him work with physical therapy and occupational therapy team to help with gait training and muscle strengthening exercises.fall precautions. Skin care. Encourage to be out of bed.   C.diff colitis No further loose stool for > 48 hours. Continue and complete course of vancomycin po 125 mg qid on 04/08/16. Hydration to be maintained  Alcohol use Advised on cessation. Continue thiamin and folic acid  Anxiety Continue xanax 0.5 mg bid prn and monitor  HLD Continue lipitor 40 mg daily  Depression Stable mood, continue wellbutrin 100 mg bid and celexa 40 mg daily  CAD Chest pain free. Continue aspirin, coreg 6.25 mg bid  HTN Stable, monitor bo and HR bid x 1 week. Continue coreg  BPH Continue flomax and monitor   Goals of care: short term rehabilitation   Labs/tests ordered: cbc, cmp 04/07/16  Family/ staff Communication: reviewed care plan with patient and nursing supervisor    Blanchie Serve, MD Internal Medicine [place], [place] [address] Cell Phone  (Monday-Friday 8 am - 5 pm): [phone] On Call: [phone] and follow prompts after 5 pm and on weekends Office Phone: [phone] Office Fax: [phone]

## 2016-04-07 LAB — CBC AND DIFFERENTIAL
HCT: 27 % — AB (ref 41–53)
Hemoglobin: 11.8 g/dL — AB (ref 13.5–17.5)
Neutrophils Absolute: 4 /uL
PLATELETS: 288 10*3/uL (ref 150–399)
WBC: 6.7 10^3/mL

## 2016-04-07 LAB — BASIC METABOLIC PANEL
BUN: 13 mg/dL (ref 4–21)
CREATININE: 1 mg/dL (ref 0.6–1.3)
Glucose: 110 mg/dL
Potassium: 3.5 mmol/L (ref 3.4–5.3)
SODIUM: 139 mmol/L (ref 137–147)

## 2016-04-07 LAB — HEPATIC FUNCTION PANEL
ALK PHOS: 88 U/L (ref 25–125)
ALT: 67 U/L — AB (ref 10–40)
AST: 53 U/L — AB (ref 14–40)
Bilirubin, Total: 0.8 mg/dL

## 2016-04-15 ENCOUNTER — Encounter: Payer: Self-pay | Admitting: Adult Health

## 2016-04-15 ENCOUNTER — Non-Acute Institutional Stay (SKILLED_NURSING_FACILITY): Payer: Medicare Other | Admitting: Adult Health

## 2016-04-15 DIAGNOSIS — R531 Weakness: Secondary | ICD-10-CM | POA: Diagnosis not present

## 2016-04-15 DIAGNOSIS — I10 Essential (primary) hypertension: Secondary | ICD-10-CM

## 2016-04-15 DIAGNOSIS — N400 Enlarged prostate without lower urinary tract symptoms: Secondary | ICD-10-CM

## 2016-04-15 DIAGNOSIS — Z7289 Other problems related to lifestyle: Secondary | ICD-10-CM

## 2016-04-15 DIAGNOSIS — Z789 Other specified health status: Secondary | ICD-10-CM

## 2016-04-15 DIAGNOSIS — E785 Hyperlipidemia, unspecified: Secondary | ICD-10-CM

## 2016-04-15 DIAGNOSIS — F329 Major depressive disorder, single episode, unspecified: Secondary | ICD-10-CM | POA: Diagnosis not present

## 2016-04-15 DIAGNOSIS — I2510 Atherosclerotic heart disease of native coronary artery without angina pectoris: Secondary | ICD-10-CM | POA: Diagnosis not present

## 2016-04-15 DIAGNOSIS — F419 Anxiety disorder, unspecified: Secondary | ICD-10-CM | POA: Diagnosis not present

## 2016-04-15 DIAGNOSIS — F1090 Alcohol use, unspecified, uncomplicated: Secondary | ICD-10-CM

## 2016-04-15 DIAGNOSIS — F32A Depression, unspecified: Secondary | ICD-10-CM

## 2016-04-15 NOTE — Progress Notes (Signed)
Patient ID: Joshua Henson, male   DOB: 12/20/1945, 70 y.o.   MRN: [id number]    DATE:  04/15/2016   MRN:  [id number]  BIRTHDAY: [birth year]  Facility:  [facility]ursing Home Location:  [place] Room Number: 509-P  LEVEL OF CARE:  SNF (31)  Contact Information    Name Relation Home Work Berino Significant other [phone]  [phone]       Code Status History    Date Active Date Inactive Code Status Order ID Comments User Context   03/23/2016  4:48 PM 04/04/2016  4:36 PM Full Code [id number]  Cristal Ford, DO Inpatient   01/18/2016  9:25 AM 01/19/2016  9:46 PM Full Code [id number]  Cherene Altes, MD Inpatient   01/15/2016  3:37 PM 01/18/2016  9:25 AM Full Code [id number]  Burnell Blanks, MD Inpatient   01/10/2016  9:58 PM 01/15/2016  3:37 PM Full Code [id number]  Norval Morton, MD Inpatient   04/02/2015  7:29 PM 04/08/2015  7:38 PM Full Code [id number]  Deneise Lever, MD ED   12/09/2014 10:27 PM 12/11/2014 12:14 PM Full Code [id number]  Evelina Bucy, MD ED   12/09/2014  4:22 PM 12/09/2014 10:27 PM Full Code [id number]  Charlott Rakes, MD ED   09/03/2014  1:19 AM 09/12/2014  3:50 PM Full Code [id number]  Allie Bossier, MD ED   07/07/2014  4:31 PM 07/11/2014  5:39 PM Full Code [id number]  Cathlyn Parsons, PA-C Inpatient   07/02/2014  3:38 AM 07/07/2014  4:31 PM Full Code [id number]  Theressa Millard, MD Inpatient       Chief Complaint  Patient presents with  . Discharge Note    HISTORY OF PRESENT ILLNESS:  This is a 70 year old male who is for discharge home with Home health PT for endurance, OT for ADLs, CNA for showers and Skilled Nursing for medication management.   He has been admitted to [facility] on 04/04/16 from [facility]. He has PMH of alcohol use, Meniere disease, CAD and hypertension. He had a fall due to alcohol withdrawal and c. Difficile colitis. He was started on CIWA protocol and PO Vancomycin. He had AKI  and received IV fluids. He was negative for fracture and acute intracranial abnormality.  Patient was admitted to this facility for short-term rehabilitation after the patient's recent hospitalization.  Patient has completed SNF rehabilitation and therapy has cleared the patient for discharge.  PAST MEDICAL HISTORY:  Past Medical History  Diagnosis Date  . Prostate cancer ([place]) 2010    External beam radiation (urol - Risa Grill, XRT Valere Dross)  . Stroke ([place])   . Depression   . Tachycardia   . Hypertension   . Insomnia   . CAD (coronary artery disease), native coronary artery 01/10/2016    3 vessel coronary calcification noted on CT scan 04/03/15    . Fatty liver, alcoholic [id number]     CURRENT MEDICATIONS: Reviewed  Patient's Medications  New Prescriptions   No medications on file  Previous Medications   ALPRAZOLAM (XANAX) 0.5 MG TABLET    Take 0.5 mg by mouth 3 (three) times daily as needed for anxiety.   ASPIRIN 81 MG EC TABLET    Take 1 tablet (81 mg total) by mouth daily.   ATORVASTATIN (LIPITOR) 40 MG TABLET    Take 1 tablet (40 mg total) by mouth daily at 6 PM.   BUPROPION (WELLBUTRIN) 100 MG TABLET  Take 100 mg by mouth 2 (two) times daily.   CARVEDILOL (COREG) 6.25 MG TABLET    Take 6.25 mg by mouth 2 (two) times daily with a meal.   CHOLECALCIFEROL (VITAMIN D) 1000 UNITS TABLET    Take 2,000 Units by mouth daily.   CITALOPRAM (CELEXA) 40 MG TABLET    Take 40 mg by mouth at bedtime.   COENZYME Q10 (CO Q-10) 200 MG CAPS    Take 200 mg by mouth daily.   FOLIC ACID (FOLVITE) 1 MG TABLET    Take 1 tablet (1 mg total) by mouth daily.   MAGNESIUM 500 MG CAPS    Take 500 mg by mouth at bedtime.   OMEGA-3 FATTY ACIDS (FISH OIL) 1000 MG CAPS    Take 1,000 mg by mouth 2 (two) times daily.    TAMSULOSIN (FLOMAX) 0.4 MG CAPS CAPSULE    Take 0.4 mg by mouth daily.    THIAMINE (VITAMIN B-1) 100 MG TABLET    Take 100 mg by mouth daily.  Modified Medications   No medications on file   Discontinued Medications   ALPRAZOLAM (XANAX) 0.5 MG TABLET    Take 1 tablet (0.5 mg total) by mouth 2 (two) times daily as needed for anxiety.   LORAZEPAM (ATIVAN) 1 MG TABLET    Take 1 mg by mouth every 6 (six) hours as needed for anxiety.   VANCOMYCIN (VANCOCIN) 125 MG CAPSULE    Take 125 mg by mouth every 6 (six) hours.     Allergies  Allergen Reactions  . Lisinopril     syncope  . Celebrex [Celecoxib] Rash     REVIEW OF SYSTEMS:  GENERAL: no change in appetite, no fatigue, no weight changes, no fever, chills or weakness EYES: Denies change in vision, dry eyes, eye pain, itching or discharge EARS: Denies change in hearing, ringing in ears, or earache NOSE: Denies nasal congestion or epistaxis MOUTH and THROAT: Denies oral discomfort, gingival pain or bleeding, pain from teeth or hoarseness   RESPIRATORY: no cough, SOB, DOE, wheezing, hemoptysis CARDIAC: no chest pain, edema or palpitations GI: no abdominal pain, diarrhea, constipation, heart burn, nausea or vomiting GU: Denies dysuria, frequency, hematuria, incontinence, or discharge PSYCHIATRIC: Denies feeling of depression or anxiety. No report of hallucinations, insomnia, paranoia, or agitation   PHYSICAL EXAMINATION  GENERAL APPEARANCE: Well nourished. In no acute distress. Normal body habitus SKIN:  Skin is warm and dry.  HEAD: Normal in size and contour. No evidence of trauma EYES: Lids open and close normally. No blepharitis, entropion or ectropion. PERRL. Conjunctivae are clear and sclerae are white. Lenses are without opacity EARS: Pinnae are normal. Patient hears normal voice tunes of the examiner MOUTH and THROAT: Lips are without lesions. Oral mucosa is moist and without lesions. Tongue is normal in shape, size, and color and without lesions NECK: supple, trachea midline, no neck masses, no thyroid tenderness, no thyromegaly LYMPHATICS: no LAN in the neck, no supraclavicular LAN RESPIRATORY: breathing is even  & unlabored, BS CTAB CARDIAC: RRR, no murmur,no extra heart sounds, no edema GI: abdomen soft, normal BS, no masses, no tenderness, no hepatomegaly, no splenomegaly EXTREMITIES: Able to move X 4 extremities PSYCHIATRIC: Alert and oriented X 3. Affect and behavior are appropriate  LABS/RADIOLOGY: Labs reviewed: Basic Metabolic Panel:  Recent Labs  01/17/16 0355 01/18/16 0517 01/19/16 0505  03/23/16 1310  03/24/16 0321  04/02/16 0527 04/03/16 0553 04/04/16 04/04/16 0529 04/07/16  NA 138 141 141  < >  --   < >  137  < > 138 141 141 141 139  K 2.8* 3.1* 3.6  < >  --   < > 3.2*  < > 4.0 4.1  --  4.1 3.5  CL 102 106 107  < >  --   < > 105  < > 106 108  --  111  --   CO2 25 25 24   < >  --   < > 23  < > 24 21*  --  22  --   GLUCOSE 126* 111* 107*  < >  --   < > 129*  < > 105* 108*  --  102*  --   BUN 8 10 10   < >  --   < > 13  < > 15 14 13 13 13   CREATININE 0.85 0.87 0.80  < >  --   < > 0.72  < > 1.10 1.00 0.9 0.87 1.0  CALCIUM 8.2* 8.5* 8.6*  < >  --   < > 8.6*  < > 9.3 9.5  --  9.2  --   MG 1.9 1.8 2.0  --  1.5*  --  1.9  --   --   --   --  1.8  --   PHOS 3.1 2.4* 3.1  --   --   --   --   --   --   --   --   --   --   < > = values in this interval not displayed. Liver Function Tests:  Recent Labs  03/24/16 0321 03/25/16 0523 03/28/16 0526 04/07/16  AST 53* 71* 78* 53*  ALT 34 40 61 67*  ALKPHOS 65 76 86 88  BILITOT 1.6* 1.4* 1.4*  --   PROT 5.3* 6.4* 7.2  --   ALBUMIN 2.9* 3.6 3.7  --     Recent Labs  03/27/16 1219  AMMONIA 25   CBC:  Recent Labs  03/22/16 0030 03/23/16 1311 03/24/16 0321 03/25/16 0523 03/28/16 0526 04/07/16  WBC 7.3 7.4 4.9 4.7 7.9 6.7  NEUTROABS 5.2 5.9  --   --   --  4  HGB 13.5 11.8* 9.4* 11.3* 13.4 11.8*  HCT 37.5* 32.1* 26.5* 30.9* 37.4* 27*  MCV 100.5* 100.9* 99.6 103.3* 102.2*  --   PLT 162 141* 138* 167 208 288   Cardiac Enzymes:  Recent Labs  01/10/16 1712 01/10/16 2322 01/11/16 0236 01/11/16 0528  CKTOTAL 47*  --   --  82   CKMB 1.3  --   --   --   TROPONINI 4.59* 4.53* 4.35* 4.46*   CBG:  Recent Labs  01/11/16 1329  GLUCAP 137*    Dg Ribs Bilateral W/chest  03/23/2016  CLINICAL DATA:  several falls recently. Was seen at [facility] Monday for same. Pt is an alcoholic. Requesting detox. Denies any etoh today. EMS reports he is hypotensive and tachycardic at 130. Hx prostate cancer. Hx HTN. Hx CAD. Smoker. BB not placed for images because pt states generalized chest and rib pain on anterior right and left sides. EXAM: BILATERAL RIBS AND CHEST - 4+ VIEW COMPARISON:  02/16/2016 FINDINGS: No fracture or other bone lesions are seen involving the ribs. There is no evidence of pneumothorax or pleural effusion. Both lungs are clear. Heart size and mediastinal contours are within normal limits. Tortuous thoracic aorta. IMPRESSION: Negative. Electronically Signed   By: Lucrezia Europe M.D.   On: 03/23/2016 13:08   Ct Head Wo Contrast  03/22/2016  CLINICAL DATA:  Pain EXAM: CT HEAD WITHOUT CONTRAST CT MAXILLOFACIAL WITHOUT CONTRAST CT CERVICAL SPINE WITHOUT CONTRAST TECHNIQUE: Multidetector CT imaging of the head, cervical spine, and maxillofacial structures were performed using the standard protocol without intravenous contrast. Multiplanar CT image reconstructions of the cervical spine and maxillofacial structures were also generated. COMPARISON:  None. FINDINGS: CT HEAD FINDINGS There is no intracranial hemorrhage, mass or evidence of acute infarction. There is mild generalized atrophy. There is mild chronic microvascular ischemic change. There is no significant extra-axial fluid collection. No acute intracranial findings are evident. No acute bony abnormality is evident. CT MAXILLOFACIAL FINDINGS Orbits are unremarkable. No maxillofacial fractures. 2 cm retention cyst in the left maxillary antrum, and membrane thickening in the floor of the right maxillary sinus. Frontal sinuses, ethmoid air cells and sphenoid sinuses are clear. Mastoid  air cells are clear. CT CERVICAL SPINE FINDINGS The vertebral column, pedicles and facet articulations are intact. There is no evidence of acute fracture. No acute soft tissue abnormalities are evident. Moderately severe cervical degenerative disc disease is present at C5-6. Mild-to-moderate mid cervical facet arthritis is present. No bone lesion or bony destruction. No evidence of acute fracture. No acute soft tissue abnormality. IMPRESSION: 1. No acute intracranial findings. There is mild generalized atrophy and chronic white matter hypodensity which likely represents small vessel disease. 2. Left maxillary antrum retention cyst. Mild membrane thickening in the floor of the right maxillary sinus. Negative for acute maxillofacial fracture. Next 3. Negative for acute cervical spine fracture. Moderately severe degenerative disc changes at C5-6. Electronically Signed   By: Andreas Newport M.D.   On: 03/22/2016 01:56   Ct Cervical Spine Wo Contrast  03/22/2016  CLINICAL DATA:  Pain EXAM: CT HEAD WITHOUT CONTRAST CT MAXILLOFACIAL WITHOUT CONTRAST CT CERVICAL SPINE WITHOUT CONTRAST TECHNIQUE: Multidetector CT imaging of the head, cervical spine, and maxillofacial structures were performed using the standard protocol without intravenous contrast. Multiplanar CT image reconstructions of the cervical spine and maxillofacial structures were also generated. COMPARISON:  None. FINDINGS: CT HEAD FINDINGS There is no intracranial hemorrhage, mass or evidence of acute infarction. There is mild generalized atrophy. There is mild chronic microvascular ischemic change. There is no significant extra-axial fluid collection. No acute intracranial findings are evident. No acute bony abnormality is evident. CT MAXILLOFACIAL FINDINGS Orbits are unremarkable. No maxillofacial fractures. 2 cm retention cyst in the left maxillary antrum, and membrane thickening in the floor of the right maxillary sinus. Frontal sinuses, ethmoid air cells  and sphenoid sinuses are clear. Mastoid air cells are clear. CT CERVICAL SPINE FINDINGS The vertebral column, pedicles and facet articulations are intact. There is no evidence of acute fracture. No acute soft tissue abnormalities are evident. Moderately severe cervical degenerative disc disease is present at C5-6. Mild-to-moderate mid cervical facet arthritis is present. No bone lesion or bony destruction. No evidence of acute fracture. No acute soft tissue abnormality. IMPRESSION: 1. No acute intracranial findings. There is mild generalized atrophy and chronic white matter hypodensity which likely represents small vessel disease. 2. Left maxillary antrum retention cyst. Mild membrane thickening in the floor of the right maxillary sinus. Negative for acute maxillofacial fracture. Next 3. Negative for acute cervical spine fracture. Moderately severe degenerative disc changes at C5-6. Electronically Signed   By: Andreas Newport M.D.   On: 03/22/2016 01:56   Ct Maxillofacial Wo Cm  03/22/2016  CLINICAL DATA:  Pain EXAM: CT HEAD WITHOUT CONTRAST CT MAXILLOFACIAL WITHOUT CONTRAST CT CERVICAL SPINE WITHOUT CONTRAST TECHNIQUE:  Multidetector CT imaging of the head, cervical spine, and maxillofacial structures were performed using the standard protocol without intravenous contrast. Multiplanar CT image reconstructions of the cervical spine and maxillofacial structures were also generated. COMPARISON:  None. FINDINGS: CT HEAD FINDINGS There is no intracranial hemorrhage, mass or evidence of acute infarction. There is mild generalized atrophy. There is mild chronic microvascular ischemic change. There is no significant extra-axial fluid collection. No acute intracranial findings are evident. No acute bony abnormality is evident. CT MAXILLOFACIAL FINDINGS Orbits are unremarkable. No maxillofacial fractures. 2 cm retention cyst in the left maxillary antrum, and membrane thickening in the floor of the right maxillary sinus.  Frontal sinuses, ethmoid air cells and sphenoid sinuses are clear. Mastoid air cells are clear. CT CERVICAL SPINE FINDINGS The vertebral column, pedicles and facet articulations are intact. There is no evidence of acute fracture. No acute soft tissue abnormalities are evident. Moderately severe cervical degenerative disc disease is present at C5-6. Mild-to-moderate mid cervical facet arthritis is present. No bone lesion or bony destruction. No evidence of acute fracture. No acute soft tissue abnormality. IMPRESSION: 1. No acute intracranial findings. There is mild generalized atrophy and chronic white matter hypodensity which likely represents small vessel disease. 2. Left maxillary antrum retention cyst. Mild membrane thickening in the floor of the right maxillary sinus. Negative for acute maxillofacial fracture. Next 3. Negative for acute cervical spine fracture. Moderately severe degenerative disc changes at C5-6. Electronically Signed   By: Andreas Newport M.D.   On: 03/22/2016 01:56    ASSESSMENT/PLAN:  Generalized weakness - for home health PT, OT, Skilled Nurse and CNA  C. Difficile Colitis - completed Vancomycin PO on 04/08/16, no diarrhea nor abdominal pain  Alcohol use  -  Continue Thiamin [id number] mg daily and folic acid 1 mg daily; advised alcohol use cessation  Anxiety - continue Xanax 0.5 mg 1 tab by mouth 3 times a day when necessary  Hyperlipidemia - continue Lipitor 40 mg 1 tab by mouth every 6 p.m.  Depression - mood is stable; continue Celexa 40 mg 1 tab by mouth daily at bedtime and Wellbutrin SR 100 mg 1 tab by mouth twice a day  CAD - stable; continue aspirin 81 mg 1 tab by mouth daily and Coreg 6.25 mg 1 tab by mouth twice a day  Hypertension - continue Coreg 6.25 mg 1 tab by mouth twice a day  BPH - continue Flomax 0.4 mg 1 capsule by mouth 80     I have filled out patient's discharge paperwork and written prescriptions.  Patient will receive home health PT, OT, Skilled  Nurse and CNA.  Total discharge time: Less than 30 minutes  Discharge time involved coordination of the discharge process with Education officer, museum, nursing staff and therapy department. Medical justification for home health services verified.   [facility], NP [employer] [phone]

## 2016-04-28 DIAGNOSIS — Z6826 Body mass index (BMI) 26.0-26.9, adult: Secondary | ICD-10-CM | POA: Diagnosis not present

## 2016-04-28 DIAGNOSIS — M5416 Radiculopathy, lumbar region: Secondary | ICD-10-CM | POA: Diagnosis not present

## 2016-05-09 ENCOUNTER — Inpatient Hospital Stay (HOSPITAL_COMMUNITY)
Admission: EM | Admit: 2016-05-09 | Discharge: 2016-05-13 | DRG: 641 | Disposition: A | Discharge status: Skilled Nursing Facility | Payer: Medicare Other | Attending: Family Medicine | Admitting: Family Medicine

## 2016-05-09 ENCOUNTER — Emergency Department (HOSPITAL_COMMUNITY): Payer: Medicare Other

## 2016-05-09 ENCOUNTER — Encounter (HOSPITAL_COMMUNITY): Payer: Self-pay | Admitting: Internal Medicine

## 2016-05-09 DIAGNOSIS — Z8673 Personal history of transient ischemic attack (TIA), and cerebral infarction without residual deficits: Secondary | ICD-10-CM

## 2016-05-09 DIAGNOSIS — R4182 Altered mental status, unspecified: Secondary | ICD-10-CM | POA: Diagnosis not present

## 2016-05-09 DIAGNOSIS — S299XXA Unspecified injury of thorax, initial encounter: Secondary | ICD-10-CM | POA: Diagnosis not present

## 2016-05-09 DIAGNOSIS — F329 Major depressive disorder, single episode, unspecified: Secondary | ICD-10-CM | POA: Diagnosis present

## 2016-05-09 DIAGNOSIS — Z7982 Long term (current) use of aspirin: Secondary | ICD-10-CM

## 2016-05-09 DIAGNOSIS — F10231 Alcohol dependence with withdrawal delirium: Secondary | ICD-10-CM | POA: Diagnosis present

## 2016-05-09 DIAGNOSIS — S01112A Laceration without foreign body of left eyelid and periocular area, initial encounter: Secondary | ICD-10-CM | POA: Diagnosis not present

## 2016-05-09 DIAGNOSIS — K7030 Alcoholic cirrhosis of liver without ascites: Secondary | ICD-10-CM | POA: Diagnosis present

## 2016-05-09 DIAGNOSIS — H8109 Meniere's disease, unspecified ear: Secondary | ICD-10-CM | POA: Diagnosis present

## 2016-05-09 DIAGNOSIS — E512 Wernicke's encephalopathy: Secondary | ICD-10-CM | POA: Diagnosis not present

## 2016-05-09 DIAGNOSIS — G9340 Encephalopathy, unspecified: Secondary | ICD-10-CM | POA: Diagnosis not present

## 2016-05-09 DIAGNOSIS — R4781 Slurred speech: Secondary | ICD-10-CM | POA: Diagnosis present

## 2016-05-09 DIAGNOSIS — F10239 Alcohol dependence with withdrawal, unspecified: Secondary | ICD-10-CM | POA: Diagnosis not present

## 2016-05-09 DIAGNOSIS — F10931 Alcohol use, unspecified with withdrawal delirium: Secondary | ICD-10-CM | POA: Diagnosis present

## 2016-05-09 DIAGNOSIS — G312 Degeneration of nervous system due to alcohol: Secondary | ICD-10-CM | POA: Diagnosis present

## 2016-05-09 DIAGNOSIS — Z8546 Personal history of malignant neoplasm of prostate: Secondary | ICD-10-CM

## 2016-05-09 DIAGNOSIS — R109 Unspecified abdominal pain: Secondary | ICD-10-CM

## 2016-05-09 DIAGNOSIS — E876 Hypokalemia: Secondary | ICD-10-CM | POA: Diagnosis present

## 2016-05-09 DIAGNOSIS — I248 Other forms of acute ischemic heart disease: Secondary | ICD-10-CM | POA: Diagnosis present

## 2016-05-09 DIAGNOSIS — I2510 Atherosclerotic heart disease of native coronary artery without angina pectoris: Secondary | ICD-10-CM | POA: Diagnosis present

## 2016-05-09 DIAGNOSIS — S0083XA Contusion of other part of head, initial encounter: Secondary | ICD-10-CM | POA: Diagnosis not present

## 2016-05-09 DIAGNOSIS — S0232XA Fracture of orbital floor, left side, initial encounter for closed fracture: Secondary | ICD-10-CM | POA: Diagnosis not present

## 2016-05-09 DIAGNOSIS — R296 Repeated falls: Secondary | ICD-10-CM

## 2016-05-09 DIAGNOSIS — R29810 Facial weakness: Secondary | ICD-10-CM | POA: Diagnosis present

## 2016-05-09 DIAGNOSIS — K700 Alcoholic fatty liver: Secondary | ICD-10-CM | POA: Diagnosis present

## 2016-05-09 DIAGNOSIS — R1012 Left upper quadrant pain: Secondary | ICD-10-CM

## 2016-05-09 DIAGNOSIS — I25119 Atherosclerotic heart disease of native coronary artery with unspecified angina pectoris: Secondary | ICD-10-CM | POA: Diagnosis not present

## 2016-05-09 DIAGNOSIS — I10 Essential (primary) hypertension: Secondary | ICD-10-CM | POA: Diagnosis present

## 2016-05-09 DIAGNOSIS — I214 Non-ST elevation (NSTEMI) myocardial infarction: Secondary | ICD-10-CM

## 2016-05-09 DIAGNOSIS — Z923 Personal history of irradiation: Secondary | ICD-10-CM

## 2016-05-09 DIAGNOSIS — F17210 Nicotine dependence, cigarettes, uncomplicated: Secondary | ICD-10-CM | POA: Diagnosis present

## 2016-05-09 DIAGNOSIS — W19XXXA Unspecified fall, initial encounter: Secondary | ICD-10-CM | POA: Diagnosis present

## 2016-05-09 HISTORY — DX: Non-ST elevation (NSTEMI) myocardial infarction: I21.4

## 2016-05-09 LAB — CBC
HEMATOCRIT: 35.1 % — AB (ref 39.0–52.0)
Hemoglobin: 12.7 g/dL — ABNORMAL LOW (ref 13.0–17.0)
MCH: 35.6 pg — AB (ref 26.0–34.0)
MCHC: 36.2 g/dL — AB (ref 30.0–36.0)
MCV: 98.3 fL (ref 78.0–100.0)
PLATELETS: 326 10*3/uL (ref 150–400)
RBC: 3.57 MIL/uL — ABNORMAL LOW (ref 4.22–5.81)
RDW: 15 % (ref 11.5–15.5)
WBC: 5.7 10*3/uL (ref 4.0–10.5)

## 2016-05-09 LAB — URINALYSIS, ROUTINE W REFLEX MICROSCOPIC
Bilirubin Urine: NEGATIVE
Glucose, UA: NEGATIVE mg/dL
HGB URINE DIPSTICK: NEGATIVE
Ketones, ur: NEGATIVE mg/dL
Leukocytes, UA: NEGATIVE
Nitrite: NEGATIVE
PH: 7.5 (ref 5.0–8.0)
Protein, ur: NEGATIVE mg/dL
SPECIFIC GRAVITY, URINE: 1.016 (ref 1.005–1.030)

## 2016-05-09 LAB — CBG MONITORING, ED: Glucose-Capillary: 104 mg/dL — ABNORMAL HIGH (ref 65–99)

## 2016-05-09 LAB — COMPREHENSIVE METABOLIC PANEL
ALBUMIN: 3.4 g/dL — AB (ref 3.5–5.0)
ALT: 20 U/L (ref 17–63)
AST: 31 U/L (ref 15–41)
Alkaline Phosphatase: 186 U/L — ABNORMAL HIGH (ref 38–126)
Anion gap: 9 (ref 5–15)
BUN: 10 mg/dL (ref 6–20)
CHLORIDE: 106 mmol/L (ref 101–111)
CO2: 24 mmol/L (ref 22–32)
CREATININE: 0.67 mg/dL (ref 0.61–1.24)
Calcium: 9.1 mg/dL (ref 8.9–10.3)
GFR calc Af Amer: >60 mL/min (ref >60)
GFR calc non Af Amer: >60 mL/min (ref >60)
GLUCOSE: 100 mg/dL — AB (ref 65–99)
POTASSIUM: 3.3 mmol/L — AB (ref 3.5–5.1)
SODIUM: 139 mmol/L (ref 135–145)
Total Bilirubin: 1.1 mg/dL (ref 0.3–1.2)
Total Protein: 6.5 g/dL (ref 6.5–8.1)

## 2016-05-09 LAB — ETHANOL: Alcohol, Ethyl (B): 9 mg/dL — ABNORMAL HIGH (ref <5)

## 2016-05-09 LAB — RAPID URINE DRUG SCREEN, HOSP PERFORMED
Amphetamines: NOT DETECTED
BARBITURATES: NOT DETECTED
Benzodiazepines: POSITIVE — AB
COCAINE: NOT DETECTED
Opiates: POSITIVE — AB
Tetrahydrocannabinol: NOT DETECTED

## 2016-05-09 LAB — AMMONIA: AMMONIA: 43 umol/L — AB (ref 9–35)

## 2016-05-09 NOTE — ED Notes (Signed)
[name], pt's wife can be contacted at [phone] or [phone]

## 2016-05-09 NOTE — ED Notes (Signed)
Pt took a fall 5 days ago and has a L bruised eye. Today pt is not acting appropriately. [place] doors at home, remote on front porch. Alert.

## 2016-05-09 NOTE — ED Provider Notes (Signed)
CSN: [id number]     Arrival date & time 05/09/16  2007 History   First MD Initiated Contact with Patient 05/09/16 2037     Chief Complaint  Patient presents with  . Altered Mental Status  . Fall      Patient is a 70 y.o. male presenting with altered mental status and fall. The history is provided by the patient.  Altered Mental Status Presenting symptoms: confusion   Associated symptoms: no abdominal pain   Fall Pertinent negatives include no chest pain and no abdominal pain.  Patient presents with altered mental status and fall. A few days ago had a fall where he hit the left side of his face. Not willing to go to the ER at that time. Has since then become more confused. Reportedly barricaded himself in his room with some pillows. States he feels bad. No neck pain. No headache. 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 Patient is also a heavy drinker. Had been in nursing home but on May 2 got out. Since it may have been drinking more. No dysuria. No abdominal pain.  Past Medical History  Diagnosis Date  . Prostate cancer ([place]) 2010    External beam radiation (urol - Risa Grill, XRT Valere Dross)  . Stroke ([place])   . Depression   . Tachycardia   . Hypertension   . Insomnia   . CAD (coronary artery disease), native coronary artery 01/10/2016    3 vessel coronary calcification noted on CT scan 04/03/15    . Fatty liver, alcoholic [id number]   Past Surgical History  Procedure Laterality Date  . None    . Cardiac catheterization N/A 01/15/2016    Procedure: Left Heart Cath and Coronary Angiography;  Surgeon: Burnell Blanks, MD;  Location: [facility];  Service: Cardiovascular;  Laterality: N/A;   Family History  Problem Relation Age of Onset  . Cancer Mother     esophageal  . Hypertension Mother   . Diabetes Mother   . Cancer Father     prostate  . Heart disease Maternal Grandfather     MI  . Cancer Paternal Grandfather     prostate   Social History   Substance Use Topics  . Smoking status: Current Every Day Smoker -- 0.50 packs/day for 30 years    Types: Cigarettes  . Smokeless tobacco: Never Used  . Alcohol Use: 33.6 oz/week    56 Shots of liquor per week     Comment: last drink yesterday    Review of Systems  Constitutional: Negative for appetite change.  Cardiovascular: Negative for chest pain.  Gastrointestinal: Negative for abdominal pain.  Genitourinary: Negative for frequency.  Musculoskeletal: Negative for back pain.  Skin: Positive for wound.  Neurological: Negative for numbness.  Psychiatric/Behavioral: Positive for confusion.      Allergies  Lisinopril and Celebrex  Home Medications   Prior to Admission medications   Medication Sig Start Date End Date Taking? Authorizing Provider  ALPRAZolam Duanne Moron) 0.5 MG tablet Take 0.5-0.75 mg by mouth at bedtime.    Yes Historical Provider, MD  aspirin 81 MG EC tablet Take 1 tablet (81 mg total) by mouth daily. 07/11/14  Yes Daniel J Angiulli, PA-C  atorvastatin (LIPITOR) 40 MG tablet Take 1 tablet (40 mg total) by mouth daily at 6 PM. 01/19/16  Yes Allie Bossier, MD  buPROPion [facility] SR) 100 MG 12 hr tablet Take 200 mg by mouth daily.  04/19/16  Yes Historical Provider,  MD  carvedilol (COREG) 6.25 MG tablet Take 6.25 mg by mouth 2 (two) times daily with a meal.   Yes Historical Provider, MD  cholecalciferol (VITAMIN D) 1000 units tablet Take 2,000 Units by mouth daily.   Yes Historical Provider, MD  citalopram (CELEXA) 20 MG tablet Take 20 mg by mouth daily.   Yes Historical Provider, MD  Coenzyme Q10 (CO Q-10) 200 MG CAPS Take 200 mg by mouth daily.   Yes Historical Provider, MD  folic acid (FOLVITE) 1 MG tablet Take 1 tablet (1 mg total) by mouth daily. 07/11/14  Yes Daniel J Angiulli, PA-C  HYDROcodone-acetaminophen (NORCO) 10-325 MG tablet Take 1 tablet by mouth every 4 (four) hours as needed. pain 05/04/16  Yes Historical Provider, MD  LORazepam (ATIVAN) 1 MG tablet  Take 1 mg by mouth 2 (two) times daily as needed. anxiety 04/26/16  Yes Historical Provider, MD  Magnesium 500 MG CAPS Take 500 mg by mouth at bedtime.   Yes Historical Provider, MD  Omega-3 Fatty Acids (FISH OIL) 1000 MG CAPS Take 1,000 mg by mouth 2 (two) times daily.    Yes Historical Provider, MD  tamsulosin (FLOMAX) 0.4 MG CAPS capsule Take 0.4 mg by mouth daily.  11/25/14  Yes Historical Provider, MD  thiamine (VITAMIN B-1) 100 MG tablet Take 100 mg by mouth daily.   Yes Historical Provider, MD  citalopram (CELEXA) 40 MG tablet Take 40 mg by mouth at bedtime. Reported on 05/09/2016    Historical Provider, MD   BP 165/104 mmHg  Pulse 91  Temp(Src) 97.9 F (36.6 C) (Oral)  Resp 13  SpO2 96% Physical Exam  Constitutional: He is oriented to person, place, and time. He appears well-developed.  HENT:  Some mild left facial droop. Slightly slurred speech. Some left lid droop. There is a healing laceration on the upper side of the upper lid on the left side. Eye movements intact. Pupils somewhat constricted. No foreign body seen under eyelid.  Eyes: EOM are normal.  Neck: Neck supple.  Cardiovascular: Normal rate.   Pulmonary/Chest: Effort normal.  Abdominal: There is no tenderness.  Musculoskeletal: Normal range of motion.  Neurological: He is alert and oriented to person, place, and time.  Skin: Skin is warm. No erythema.  Psychiatric:  Somewhat flat affect.    ED Course  Procedures (including critical care time) Labs Review Labs Reviewed  COMPREHENSIVE METABOLIC PANEL - Abnormal; Notable for the following:    Potassium 3.3 (*)    Glucose, Bld 100 (*)    Albumin 3.4 (*)    Alkaline Phosphatase 186 (*)    All other components within normal limits  CBC - Abnormal; Notable for the following:    RBC 3.57 (*)    Hemoglobin 12.7 (*)    HCT 35.1 (*)    MCH 35.6 (*)    MCHC 36.2 (*)    All other components within normal limits  ETHANOL - Abnormal; Notable for the following:     Alcohol, Ethyl (B) 9 (*)    All other components within normal limits  CBG MONITORING, ED - Abnormal; Notable for the following:    Glucose-Capillary 104 (*)    All other components within normal limits  URINALYSIS, ROUTINE W REFLEX MICROSCOPIC (NOT AT [facility])    Imaging Review Dg Chest 2 View  05/09/2016  CLINICAL DATA:  Fall 5 days prior with altered mental status. EXAM: CHEST  2 VIEW COMPARISON:  Most recent chest radiograph 03/23/2016. Additional priors radiographs reviewed. FINDINGS:  The cardiomediastinal contours are unchanged, heart at the upper limits of normal in size. There is thickening of the paratracheal stripes, stable and likely related to overlapping vascular structures. Obscuration of the left hemidiaphragm secondary to small left pleural effusion No pulmonary edema. No pneumothorax. No acute osseous abnormalities are seen. IMPRESSION: Small left pleural effusion. No pneumothorax or evidence of displaced rib fracture. Electronically Signed   By: Jeb Levering M.D.   On: 05/09/2016 21:29   Ct Head Wo Contrast  05/09/2016  CLINICAL DATA:  Initial evaluation for acute fall 5 days ago. Bruising to left thigh. EXAM: CT HEAD WITHOUT CONTRAST CT MAXILLOFACIAL WITHOUT CONTRAST TECHNIQUE: Multidetector CT imaging of the head and maxillofacial structures were performed using the standard protocol without intravenous contrast. Multiplanar CT image reconstructions of the maxillofacial structures were also generated. COMPARISON:  None. FINDINGS: CT HEAD FINDINGS Diffuse prominence of the CSF containing spaces compatible with generalized cerebral atrophy. Mild to moderate chronic small vessel ischemic disease present. Scattered vascular calcifications within the carotid siphons. No acute intracranial hemorrhage. No acute large vessel territory infarct. No mass lesion, midline shift, or mass effect para ventricular prominence related to global parenchymal volume loss present without hydrocephalus. No  extra-axial fluid collection. Left periorbital contusion. Calcification at the anterior aspect of the left globe. Globes themselves grossly intact. No retro-orbital process. Scalp soft tissues otherwise normal. No mastoid effusion. Calvarium intact. CT MAXILLOFACIAL FINDINGS Left periorbital contusion/ laceration. Two adjacent radiopaque calcific densities present anterior to the left globe measuring up to 7 mm (series 5, image 58). These are suspicious for retained debris. Left globe itself intact. Right globe normal. No retro-orbital hematoma or other pathology. Mild left facial contusion as well. No orbital floor fracture. Small focal old defect in the left lamina papyracea is new relative to prior exam, and likely reflects a small acute fracture (series 9, image 33). Associated 2 mm of displacement. Orbital roofs and lateral orbital walls are intact. Zygomatic arches intact. No acute maxillary fracture. Pterygoid plates intact. No acute nasal bone fracture. Nasal septum intact. Mandible intact. Mandibular condyles mildly subluxed anteriorly relative to the temporomandibular fossa, likely related to jaw positioning. Poor dentition noted. No acute abnormality about the dentition itself. Chronic bilateral maxillary sinus disease again noted, stable. Minimal mucosal thickening within the ethmoidal air cells. Paranasal sinuses are otherwise clear. Visualized upper cervical spine grossly intact. IMPRESSION: CT HEAD: 1. No acute intracranial process. 2. Stable atrophy with chronic small vessel ischemic disease. CT MAXILLOFACIAL: 1. Left periorbital/facial contusion with left periorbital laceration. Scattered radiopaque densities anterior to the left globe likely reflect retained debris. Globe itself intact with no retro-orbital pathology. 2. Acute fracture of the left lamina papyracea with 2 mm of displacement. While this fracture somewhat age indeterminate in appearance, this is new relative to most recent CT from  03/22/2016, and is suspected to have occurred with this most recent trauma 3. Chronic maxillary sinusitis. Electronically Signed   By: Jeannine Boga M.D.   On: 05/09/2016 22:00   Ct Maxillofacial Wo Cm  05/09/2016  CLINICAL DATA:  Initial evaluation for acute fall 5 days ago. Bruising to left thigh. EXAM: CT HEAD WITHOUT CONTRAST CT MAXILLOFACIAL WITHOUT CONTRAST TECHNIQUE: Multidetector CT imaging of the head and maxillofacial structures were performed using the standard protocol without intravenous contrast. Multiplanar CT image reconstructions of the maxillofacial structures were also generated. COMPARISON:  None. FINDINGS: CT HEAD FINDINGS Diffuse prominence of the CSF containing spaces compatible with generalized cerebral atrophy. Mild to moderate chronic  small vessel ischemic disease present. Scattered vascular calcifications within the carotid siphons. No acute intracranial hemorrhage. No acute large vessel territory infarct. No mass lesion, midline shift, or mass effect para ventricular prominence related to global parenchymal volume loss present without hydrocephalus. No extra-axial fluid collection. Left periorbital contusion. Calcification at the anterior aspect of the left globe. Globes themselves grossly intact. No retro-orbital process. Scalp soft tissues otherwise normal. No mastoid effusion. Calvarium intact. CT MAXILLOFACIAL FINDINGS Left periorbital contusion/ laceration. Two adjacent radiopaque calcific densities present anterior to the left globe measuring up to 7 mm (series 5, image 58). These are suspicious for retained debris. Left globe itself intact. Right globe normal. No retro-orbital hematoma or other pathology. Mild left facial contusion as well. No orbital floor fracture. Small focal old defect in the left lamina papyracea is new relative to prior exam, and likely reflects a small acute fracture (series 9, image 33). Associated 2 mm of displacement. Orbital roofs and lateral  orbital walls are intact. Zygomatic arches intact. No acute maxillary fracture. Pterygoid plates intact. No acute nasal bone fracture. Nasal septum intact. Mandible intact. Mandibular condyles mildly subluxed anteriorly relative to the temporomandibular fossa, likely related to jaw positioning. Poor dentition noted. No acute abnormality about the dentition itself. Chronic bilateral maxillary sinus disease again noted, stable. Minimal mucosal thickening within the ethmoidal air cells. Paranasal sinuses are otherwise clear. Visualized upper cervical spine grossly intact. IMPRESSION: CT HEAD: 1. No acute intracranial process. 2. Stable atrophy with chronic small vessel ischemic disease. CT MAXILLOFACIAL: 1. Left periorbital/facial contusion with left periorbital laceration. Scattered radiopaque densities anterior to the left globe likely reflect retained debris. Globe itself intact with no retro-orbital pathology. 2. Acute fracture of the left lamina papyracea with 2 mm of displacement. While this fracture somewhat age indeterminate in appearance, this is new relative to most recent CT from 03/22/2016, and is suspected to have occurred with this most recent trauma 3. Chronic maxillary sinusitis. Electronically Signed   By: Jeannine Boga M.D.   On: 05/09/2016 22:00   I have personally reviewed and evaluated these images and lab results as part of my medical decision-making.   EKG Interpretation   Date/Time:  Monday May 09 2016 20:31:44 EDT Ventricular Rate:  91 PR Interval:  147 QRS Duration: 87 QT Interval:  374 QTC Calculation: 460 R Axis:   85 Text Interpretation:  Sinus rhythm Borderline right axis deviation  Borderline T abnormalities, anterior leads Confirmed by Alvino Chapel  MD,  Ovid Curd [phone]) on 05/09/2016 10:27:43 PM      MDM   Final diagnoses:  Encephalopathy  Eyelid laceration, left, initial encounter  Facial contusion, initial encounter    Patient with altered mental status.  Some slurred speech and some left-sided facial droop. Also injury upper lid. Head CT shows contusion. No foreign body seen but there was some retained debris is on CT. Small nasal area fracture does not need acute treatment. Patient continues to feel bad and has had more confusion. Alcohol level is slightly present. Could have some alcohol withdrawal contributing to this. Reportedly has been a heavy drinker. Also with facial droop may need further evaluation. Will admit to internal medicine.`    Davonna Belling, MD 05/09/16 2235

## 2016-05-09 NOTE — H&P (Addendum)
Joshua Henson [id number] DOB: 1946-11-07 DOA: 05/09/2016    PCP: Mayra Neer, MD   Outpatient Specialists: Cardiology Angelena Form, Neurology Tomi Likens Patient coming from: home  Chief Complaint: confusion  HPI: Joshua Henson is a 70 y.o. male with medical history significant of CAD, alcoholism, C. difficile colitis, Meniere disease, hypertension, remote history of prostate cancer, depression, history of stroke    Presented with confusion started from today patient has barricaded doors at home with pillows. She had a fall 5 days ago resulting in left arm being bruised. She states she shows bad but cannot reiterate and which way. Patient is known history of heavy alcohol abuse wife is concerned he's been drinking more unusual. Patient does not endorse chest pain but does endorse left upper quadrant abdominal pain under the ribs is been to persistent. Patient is a poor historian states he is unsure why he is here states last alcohol drink was 2 weeks ago  Per ER notes is wife was concerned patient has been drinking more heavily recently. Patient have had frequent falls in the past Regarding pertinent Chronic problems: History of coronary artery disease status post cardiac catheterization January 2017 showing moderately severe stenosis of mid segment of nondominant RCA cardiology felt to be non-significant recommended statin and aspirin Patient has history of long-standing alcohol abuse with admissions for withdrawal and delirium tremens complicated by aspiration pneumonia and respiratory failure. Last admission was in April SECONDARY to alcohol withdrawal  Patient history of C. difficile colitis treated with vancomycin in April  IN ER: Afebrile heart rate up to 107 blood pressure up to 160 06/18/2004 hemoglobin 12.7 potassium 3.3 troponin elevated 11.6 ammonia level elevated at 43. U tox significant for opiates and benzodiazepines CT of the head showing no evidence of intracranial bleeding is a  nasal fracture  Chest x-ray showing small left pleural effusion no rib fractures In emergency department he was given tramadol   Hospitalist was called for admission for acute encephalopathy  Review of Systems:    Pertinent positives include: Frequent falls, left upper quadrant abdominal pain, confusion  Constitutional:  No weight loss, night sweats, Fevers, chills, fatigue, weight loss  HEENT:  No headaches, Difficulty swallowing,Tooth/dental problems,Sore throat,  No sneezing, itching, ear ache, nasal congestion, post nasal drip,  Cardio-vascular:  No chest pain, Orthopnea, PND, anasarca, dizziness, palpitations.no Bilateral lower extremity swelling  GI:  No heartburn, indigestion, abdominal pain, nausea, vomiting, diarrhea, change in bowel habits, loss of appetite, melena, blood in stool, hematemesis Resp:  no shortness of breath at rest. No dyspnea on exertion, No excess mucus, no productive cough, No non-productive cough, No coughing up of blood.No change in color of mucus.No wheezing. Skin:  no rash or lesions. No jaundice GU:  no dysuria, change in color of urine, no urgency or frequency. No straining to urinate.  No flank pain.  Musculoskeletal:  No joint pain or no joint swelling. No decreased range of motion. No back pain.  Psych:  No change in mood or affect. No depression or anxiety. No memory loss.  Neuro: no localizing neurological complaints, no tingling, no weakness, no double vision, no gait abnormality, no slurred speech, As per HPI otherwise 10 point review of systems negative.   Past Medical History: Past Medical History  Diagnosis Date  . Prostate cancer ([place]) 2010    External beam radiation (urol - Risa Grill, XRT Valere Dross)  . Stroke ([place])   . Depression   . Tachycardia   . Hypertension   .  Insomnia   . CAD (coronary artery disease), native coronary artery 01/10/2016    3 vessel coronary calcification noted on CT scan 04/03/15    . Fatty liver, alcoholic  [id number]   Past Surgical History  Procedure Laterality Date  . None    . Cardiac catheterization N/A 01/15/2016    Procedure: Left Heart Cath and Coronary Angiography;  Surgeon: Burnell Blanks, MD;  Location: [facility];  Service: Cardiovascular;  Laterality: N/A;     Social History:  Ambulatory   walker cane  Lives at home  With family     reports that he has been smoking Cigarettes.  He has a 15 pack-year smoking history. He has never used smokeless tobacco. He reports that he drinks about 33.6 oz of alcohol per week. He reports that he does not use illicit drugs.  Allergies:   Allergies  Allergen Reactions  . Lisinopril     syncope  . Celebrex [Celecoxib] Rash       Family History:    Family History  Problem Relation Age of Onset  . Cancer Mother     esophageal  . Hypertension Mother   . Diabetes Mother   . Cancer Father     prostate  . Heart disease Maternal Grandfather     MI  . Cancer Paternal Grandfather     prostate    Medications: Prior to Admission medications   Medication Sig Start Date End Date Taking? Authorizing Provider  ALPRAZolam Duanne Moron) 0.5 MG tablet Take 0.5-0.75 mg by mouth at bedtime.    Yes Historical Provider, MD  aspirin 81 MG EC tablet Take 1 tablet (81 mg total) by mouth daily. 07/11/14  Yes Daniel J Angiulli, PA-C  atorvastatin (LIPITOR) 40 MG tablet Take 1 tablet (40 mg total) by mouth daily at 6 PM. 01/19/16  Yes Allie Bossier, MD  buPROPion [facility] SR) 100 MG 12 hr tablet Take 200 mg by mouth daily.  04/19/16  Yes Historical Provider, MD  carvedilol (COREG) 6.25 MG tablet Take 6.25 mg by mouth 2 (two) times daily with a meal.   Yes Historical Provider, MD  cholecalciferol (VITAMIN D) 1000 units tablet Take 2,000 Units by mouth daily.   Yes Historical Provider, MD  citalopram (CELEXA) 20 MG tablet Take 20 mg by mouth daily.   Yes Historical Provider, MD  Coenzyme Q10 (CO Q-10) 200 MG CAPS Take 200 mg by mouth daily.    Yes Historical Provider, MD  folic acid (FOLVITE) 1 MG tablet Take 1 tablet (1 mg total) by mouth daily. 07/11/14  Yes Daniel J Angiulli, PA-C  HYDROcodone-acetaminophen (NORCO) 10-325 MG tablet Take 1 tablet by mouth every 4 (four) hours as needed. pain 05/04/16  Yes Historical Provider, MD  LORazepam (ATIVAN) 1 MG tablet Take 1 mg by mouth 2 (two) times daily as needed. anxiety 04/26/16  Yes Historical Provider, MD  Magnesium 500 MG CAPS Take 500 mg by mouth at bedtime.   Yes Historical Provider, MD  Omega-3 Fatty Acids (FISH OIL) 1000 MG CAPS Take 1,000 mg by mouth 2 (two) times daily.    Yes Historical Provider, MD  tamsulosin (FLOMAX) 0.4 MG CAPS capsule Take 0.4 mg by mouth daily.  11/25/14  Yes Historical Provider, MD  thiamine (VITAMIN B-1) 100 MG tablet Take 100 mg by mouth daily.   Yes Historical Provider, MD  citalopram (CELEXA) 40 MG tablet Take 40 mg by mouth at bedtime. Reported on 05/09/2016    Historical Provider, MD  Physical Exam: Patient Vitals for the past 24 hrs:  BP Temp Temp src Pulse Resp SpO2  05/09/16 2250 164/98 mmHg - - 80 11 95 %  05/09/16 2149 (!) 165/104 mmHg - - 91 13 96 %  05/09/16 2015 (!) 173/112 mmHg 97.9 F (36.6 C) Oral 107 - 100 %    1. General:  in No Acute distress 2. Psychological: Alert and  Oriented 3. Head/ENT:     Dry Mucous Membranes                          Head   Traumatic healing laceration above left eye, neck supple                            Poor Dentition 4. SKIN:   decreased Skin turgor,  Skin clean Dry and intact no rash 5. Heart: Regular rate and rhythm no  Murmur, Rub or gallop 6. Lungs:  no wheezes or crackles   7. Abdomen: Soft, non-tender, Non distended 8. Lower extremities: no clubbing, cyanosis, or edema 9. Neurologically strength 5 out of 5 in no 4 extremities cranial nerves II through XII appears to be intact   10. MSK: Normal range of motion   body mass index is unknown because there is no weight on file.  Labs on  Admission:   Labs on Admission: I have personally reviewed following labs and imaging studies  CBC:  Recent Labs Lab 05/09/16 2044  WBC 5.7  HGB 12.7*  HCT 35.1*  MCV 98.3  PLT [id number]   Basic Metabolic Panel:  Recent Labs Lab 05/09/16 2044  NA 139  K 3.3*  CL 106  CO2 24  GLUCOSE 100*  BUN 10  CREATININE 0.67  CALCIUM 9.1   GFR: CrCl cannot be calculated (Unknown ideal weight.). Liver Function Tests:  Recent Labs Lab 05/09/16 2044  AST 31  ALT 20  ALKPHOS 186*  BILITOT 1.1  PROT 6.5  ALBUMIN 3.4*   No results for input(s): LIPASE, AMYLASE in the last 168 hours. No results for input(s): AMMONIA in the last 168 hours. Coagulation Profile: No results for input(s): INR, PROTIME in the last 168 hours. Cardiac Enzymes: No results for input(s): CKTOTAL, CKMB, CKMBINDEX, TROPONINI in the last 168 hours. BNP (last 3 results) No results for input(s): PROBNP in the last 8760 hours. HbA1C: No results for input(s): HGBA1C in the last 72 hours. CBG:  Recent Labs Lab 05/09/16 2032  GLUCAP 104*   Lipid Profile: No results for input(s): CHOL, HDL, LDLCALC, TRIG, CHOLHDL, LDLDIRECT in the last 72 hours. Thyroid Function Tests: No results for input(s): TSH, T4TOTAL, FREET4, T3FREE, THYROIDAB in the last 72 hours. Anemia Panel: No results for input(s): VITAMINB12, FOLATE, FERRITIN, TIBC, IRON, RETICCTPCT in the last 72 hours. Urine analysis:    Component Value Date/Time   COLORURINE YELLOW 05/09/2016 2144   APPEARANCEUR CLEAR 05/09/2016 2144   LABSPEC 1.016 05/09/2016 2144   PHURINE 7.5 05/09/2016 2144   GLUCOSEU NEGATIVE 05/09/2016 2144   HGBUR NEGATIVE 05/09/2016 2144   BILIRUBINUR NEGATIVE 05/09/2016 2144   [place] NEGATIVE 05/09/2016 2144   PROTEINUR NEGATIVE 05/09/2016 2144   UROBILINOGEN 2.0* 04/02/2015 2013   NITRITE NEGATIVE 05/09/2016 2144   LEUKOCYTESUR NEGATIVE 05/09/2016 2144   Sepsis Labs: @LABRCNTIP (procalcitonin:4,lacticidven:4) )No  results found for this or any previous visit (from the past 240 hour(s)).    UA  no evidence of UTI  Lab Results  Component Value Date   HGBA1C 5.7* 09/03/2014    CrCl cannot be calculated (Unknown ideal weight.).  BNP (last 3 results) No results for input(s): PROBNP in the last 8760 hours.   ECG REPORT  Independently reviewed Rate:81  Rhythm: NSR ST&T Change: No acute ischemic changes   QTC 458  There were no vitals filed for this visit.   Cultures:    Component Value Date/Time   SDES URINE, RANDOM 01/12/2016 0257   SPECREQUEST NONE 01/12/2016 0257   CULT  01/11/2016 2250    ABUNDANT STAPHYLOCOCCUS AUREUS Note: RIFAMPIN AND GENTAMICIN SHOULD NOT BE USED AS SINGLE DRUGS FOR TREATMENT OF STAPH INFECTIONS. This organism is presumed to be Clindamycin resistant based on detection of inducible Clindamycin resistance. Performed at [facility] 01/13/2016 FINAL 01/12/2016 0257     Radiological Exams on Admission: Dg Chest 2 View  05/09/2016  CLINICAL DATA:  Fall 5 days prior with altered mental status. EXAM: CHEST  2 VIEW COMPARISON:  Most recent chest radiograph 03/23/2016. Additional priors radiographs reviewed. FINDINGS: The cardiomediastinal contours are unchanged, heart at the upper limits of normal in size. There is thickening of the paratracheal stripes, stable and likely related to overlapping vascular structures. Obscuration of the left hemidiaphragm secondary to small left pleural effusion No pulmonary edema. No pneumothorax. No acute osseous abnormalities are seen. IMPRESSION: Small left pleural effusion. No pneumothorax or evidence of displaced rib fracture. Electronically Signed   By: Jeb Levering M.D.   On: 05/09/2016 21:29   Ct Head Wo Contrast  05/09/2016  CLINICAL DATA:  Initial evaluation for acute fall 5 days ago. Bruising to left thigh. EXAM: CT HEAD WITHOUT CONTRAST CT MAXILLOFACIAL WITHOUT CONTRAST TECHNIQUE: Multidetector CT imaging  of the head and maxillofacial structures were performed using the standard protocol without intravenous contrast. Multiplanar CT image reconstructions of the maxillofacial structures were also generated. COMPARISON:  None. FINDINGS: CT HEAD FINDINGS Diffuse prominence of the CSF containing spaces compatible with generalized cerebral atrophy. Mild to moderate chronic small vessel ischemic disease present. Scattered vascular calcifications within the carotid siphons. No acute intracranial hemorrhage. No acute large vessel territory infarct. No mass lesion, midline shift, or mass effect para ventricular prominence related to global parenchymal volume loss present without hydrocephalus. No extra-axial fluid collection. Left periorbital contusion. Calcification at the anterior aspect of the left globe. Globes themselves grossly intact. No retro-orbital process. Scalp soft tissues otherwise normal. No mastoid effusion. Calvarium intact. CT MAXILLOFACIAL FINDINGS Left periorbital contusion/ laceration. Two adjacent radiopaque calcific densities present anterior to the left globe measuring up to 7 mm (series 5, image 58). These are suspicious for retained debris. Left globe itself intact. Right globe normal. No retro-orbital hematoma or other pathology. Mild left facial contusion as well. No orbital floor fracture. Small focal old defect in the left lamina papyracea is new relative to prior exam, and likely reflects a small acute fracture (series 9, image 33). Associated 2 mm of displacement. Orbital roofs and lateral orbital walls are intact. Zygomatic arches intact. No acute maxillary fracture. Pterygoid plates intact. No acute nasal bone fracture. Nasal septum intact. Mandible intact. Mandibular condyles mildly subluxed anteriorly relative to the temporomandibular fossa, likely related to jaw positioning. Poor dentition noted. No acute abnormality about the dentition itself. Chronic bilateral maxillary sinus disease  again noted, stable. Minimal mucosal thickening within the ethmoidal air cells. Paranasal sinuses are otherwise clear. Visualized upper cervical spine grossly intact. IMPRESSION: CT HEAD: 1. No  acute intracranial process. 2. Stable atrophy with chronic small vessel ischemic disease. CT MAXILLOFACIAL: 1. Left periorbital/facial contusion with left periorbital laceration. Scattered radiopaque densities anterior to the left globe likely reflect retained debris. Globe itself intact with no retro-orbital pathology. 2. Acute fracture of the left lamina papyracea with 2 mm of displacement. While this fracture somewhat age indeterminate in appearance, this is new relative to most recent CT from 03/22/2016, and is suspected to have occurred with this most recent trauma 3. Chronic maxillary sinusitis. Electronically Signed   By: Jeannine Boga M.D.   On: 05/09/2016 22:00   Ct Maxillofacial Wo Cm  05/09/2016  CLINICAL DATA:  Initial evaluation for acute fall 5 days ago. Bruising to left thigh. EXAM: CT HEAD WITHOUT CONTRAST CT MAXILLOFACIAL WITHOUT CONTRAST TECHNIQUE: Multidetector CT imaging of the head and maxillofacial structures were performed using the standard protocol without intravenous contrast. Multiplanar CT image reconstructions of the maxillofacial structures were also generated. COMPARISON:  None. FINDINGS: CT HEAD FINDINGS Diffuse prominence of the CSF containing spaces compatible with generalized cerebral atrophy. Mild to moderate chronic small vessel ischemic disease present. Scattered vascular calcifications within the carotid siphons. No acute intracranial hemorrhage. No acute large vessel territory infarct. No mass lesion, midline shift, or mass effect para ventricular prominence related to global parenchymal volume loss present without hydrocephalus. No extra-axial fluid collection. Left periorbital contusion. Calcification at the anterior aspect of the left globe. Globes themselves grossly  intact. No retro-orbital process. Scalp soft tissues otherwise normal. No mastoid effusion. Calvarium intact. CT MAXILLOFACIAL FINDINGS Left periorbital contusion/ laceration. Two adjacent radiopaque calcific densities present anterior to the left globe measuring up to 7 mm (series 5, image 58). These are suspicious for retained debris. Left globe itself intact. Right globe normal. No retro-orbital hematoma or other pathology. Mild left facial contusion as well. No orbital floor fracture. Small focal old defect in the left lamina papyracea is new relative to prior exam, and likely reflects a small acute fracture (series 9, image 33). Associated 2 mm of displacement. Orbital roofs and lateral orbital walls are intact. Zygomatic arches intact. No acute maxillary fracture. Pterygoid plates intact. No acute nasal bone fracture. Nasal septum intact. Mandible intact. Mandibular condyles mildly subluxed anteriorly relative to the temporomandibular fossa, likely related to jaw positioning. Poor dentition noted. No acute abnormality about the dentition itself. Chronic bilateral maxillary sinus disease again noted, stable. Minimal mucosal thickening within the ethmoidal air cells. Paranasal sinuses are otherwise clear. Visualized upper cervical spine grossly intact. IMPRESSION: CT HEAD: 1. No acute intracranial process. 2. Stable atrophy with chronic small vessel ischemic disease. CT MAXILLOFACIAL: 1. Left periorbital/facial contusion with left periorbital laceration. Scattered radiopaque densities anterior to the left globe likely reflect retained debris. Globe itself intact with no retro-orbital pathology. 2. Acute fracture of the left lamina papyracea with 2 mm of displacement. While this fracture somewhat age indeterminate in appearance, this is new relative to most recent CT from 03/22/2016, and is suspected to have occurred with this most recent trauma 3. Chronic maxillary sinusitis. Electronically Signed   By: Jeannine Boga M.D.   On: 05/09/2016 22:00    Chart has been reviewed    Assessment/Plan  70 y.o. male with medical history significant of CAD, alcoholism, C. difficile colitis, Meniere disease, hypertension, remote history of prostate cancer, depression, history of stroke here with acute encephalopathy likely secondary to alcohol abuse and/or hepatic encephalopathy with elevated troponin and possible chest versus abdominal left-sided pain worrisome for an NSTEMI  Present on Admission:  NSTEMI - will transfer to [facility] when beds available. Obtain CT of the abdomen given abdominal discomfort and recent fall to make sure is no intra-abdominal bleeding if not we will initiate heparin drip and nitroglycerin drip to control symptoms. Left-sided abdominal/lower chest pain could be possible anginal equivalent  . Acute encephalopathy in a setting of recent alcohol abuse frequent falls because of polysubstance abuse and elevated ammonia initiate lactulose rehydrate  . Alcohol withdrawal ([place]) - CIWA protocol . CAD (coronary artery disease), native coronary artery- if no evidence of intra-abdominal bleeding he will initiate aspirin and heparin appreciate cardiology consult  . Hypokalemia  - replace     Other plan as per orders.  DVT prophylaxis:  heparin   Code Status:  FULL CODE  As per family  Family Communication:   Family not  at  Bedside Discussed with  Wife phone number [phone]  Disposition Plan:    likely will need placement for rehabilitation                           Consults called: Cardiology   Admission status:   Inpatient     Level of care    SDU      I have spent a total of 65 min on this admission   extra time was spent to discuss case with cardiology  [place] 05/09/2016, 1:10 AM   [facility]  Pager [phone]   after 2 AM please page floor coverage PA If 7AM-7PM, please contact the day team taking care of the patient  [email or web address]  Password  [email or web address]

## 2016-05-10 ENCOUNTER — Encounter (HOSPITAL_COMMUNITY): Payer: Self-pay | Admitting: Internal Medicine

## 2016-05-10 ENCOUNTER — Observation Stay (HOSPITAL_COMMUNITY): Payer: Medicare Other

## 2016-05-10 ENCOUNTER — Other Ambulatory Visit (HOSPITAL_COMMUNITY): Payer: Self-pay

## 2016-05-10 DIAGNOSIS — R29810 Facial weakness: Secondary | ICD-10-CM | POA: Diagnosis present

## 2016-05-10 DIAGNOSIS — F17210 Nicotine dependence, cigarettes, uncomplicated: Secondary | ICD-10-CM | POA: Diagnosis present

## 2016-05-10 DIAGNOSIS — R7989 Other specified abnormal findings of blood chemistry: Secondary | ICD-10-CM

## 2016-05-10 DIAGNOSIS — N179 Acute kidney failure, unspecified: Secondary | ICD-10-CM | POA: Diagnosis not present

## 2016-05-10 DIAGNOSIS — Z8673 Personal history of transient ischemic attack (TIA), and cerebral infarction without residual deficits: Secondary | ICD-10-CM | POA: Diagnosis not present

## 2016-05-10 DIAGNOSIS — R4781 Slurred speech: Secondary | ICD-10-CM | POA: Diagnosis present

## 2016-05-10 DIAGNOSIS — R1012 Left upper quadrant pain: Secondary | ICD-10-CM | POA: Diagnosis present

## 2016-05-10 DIAGNOSIS — R2681 Unsteadiness on feet: Secondary | ICD-10-CM | POA: Diagnosis not present

## 2016-05-10 DIAGNOSIS — K700 Alcoholic fatty liver: Secondary | ICD-10-CM | POA: Diagnosis present

## 2016-05-10 DIAGNOSIS — G9340 Encephalopathy, unspecified: Secondary | ICD-10-CM | POA: Diagnosis not present

## 2016-05-10 DIAGNOSIS — F329 Major depressive disorder, single episode, unspecified: Secondary | ICD-10-CM | POA: Diagnosis present

## 2016-05-10 DIAGNOSIS — E876 Hypokalemia: Secondary | ICD-10-CM | POA: Diagnosis not present

## 2016-05-10 DIAGNOSIS — W19XXXA Unspecified fall, initial encounter: Secondary | ICD-10-CM | POA: Diagnosis present

## 2016-05-10 DIAGNOSIS — H8109 Meniere's disease, unspecified ear: Secondary | ICD-10-CM | POA: Diagnosis present

## 2016-05-10 DIAGNOSIS — I2510 Atherosclerotic heart disease of native coronary artery without angina pectoris: Secondary | ICD-10-CM | POA: Diagnosis not present

## 2016-05-10 DIAGNOSIS — R296 Repeated falls: Secondary | ICD-10-CM | POA: Diagnosis not present

## 2016-05-10 DIAGNOSIS — Z7982 Long term (current) use of aspirin: Secondary | ICD-10-CM | POA: Diagnosis not present

## 2016-05-10 DIAGNOSIS — F10239 Alcohol dependence with withdrawal, unspecified: Secondary | ICD-10-CM | POA: Diagnosis not present

## 2016-05-10 DIAGNOSIS — R4182 Altered mental status, unspecified: Secondary | ICD-10-CM | POA: Diagnosis present

## 2016-05-10 DIAGNOSIS — K709 Alcoholic liver disease, unspecified: Secondary | ICD-10-CM | POA: Diagnosis not present

## 2016-05-10 DIAGNOSIS — Z5189 Encounter for other specified aftercare: Secondary | ICD-10-CM | POA: Diagnosis not present

## 2016-05-10 DIAGNOSIS — K7030 Alcoholic cirrhosis of liver without ascites: Secondary | ICD-10-CM | POA: Diagnosis present

## 2016-05-10 DIAGNOSIS — Z923 Personal history of irradiation: Secondary | ICD-10-CM | POA: Diagnosis not present

## 2016-05-10 DIAGNOSIS — R259 Unspecified abnormal involuntary movements: Secondary | ICD-10-CM | POA: Diagnosis not present

## 2016-05-10 DIAGNOSIS — I248 Other forms of acute ischemic heart disease: Secondary | ICD-10-CM | POA: Diagnosis present

## 2016-05-10 DIAGNOSIS — E785 Hyperlipidemia, unspecified: Secondary | ICD-10-CM | POA: Diagnosis not present

## 2016-05-10 DIAGNOSIS — G312 Degeneration of nervous system due to alcohol: Secondary | ICD-10-CM | POA: Diagnosis present

## 2016-05-10 DIAGNOSIS — Z8546 Personal history of malignant neoplasm of prostate: Secondary | ICD-10-CM | POA: Diagnosis not present

## 2016-05-10 DIAGNOSIS — I10 Essential (primary) hypertension: Secondary | ICD-10-CM | POA: Diagnosis present

## 2016-05-10 DIAGNOSIS — M6281 Muscle weakness (generalized): Secondary | ICD-10-CM | POA: Diagnosis not present

## 2016-05-10 DIAGNOSIS — F10231 Alcohol dependence with withdrawal delirium: Secondary | ICD-10-CM | POA: Diagnosis not present

## 2016-05-10 DIAGNOSIS — E512 Wernicke's encephalopathy: Secondary | ICD-10-CM | POA: Diagnosis present

## 2016-05-10 DIAGNOSIS — R109 Unspecified abdominal pain: Secondary | ICD-10-CM | POA: Diagnosis not present

## 2016-05-10 DIAGNOSIS — S01112A Laceration without foreign body of left eyelid and periocular area, initial encounter: Secondary | ICD-10-CM | POA: Diagnosis present

## 2016-05-10 DIAGNOSIS — R488 Other symbolic dysfunctions: Secondary | ICD-10-CM | POA: Diagnosis not present

## 2016-05-10 LAB — COMPREHENSIVE METABOLIC PANEL
ALBUMIN: 3.1 g/dL — AB (ref 3.5–5.0)
ALK PHOS: 166 U/L — AB (ref 38–126)
ALT: 17 U/L (ref 17–63)
AST: 28 U/L (ref 15–41)
Anion gap: 6 (ref 5–15)
BILIRUBIN TOTAL: 1.1 mg/dL (ref 0.3–1.2)
BUN: 8 mg/dL (ref 6–20)
CALCIUM: 8.3 mg/dL — AB (ref 8.9–10.3)
CO2: 23 mmol/L (ref 22–32)
Chloride: 109 mmol/L (ref 101–111)
Creatinine, Ser: 0.66 mg/dL (ref 0.61–1.24)
GFR calc Af Amer: >60 mL/min (ref >60)
GFR calc non Af Amer: >60 mL/min (ref >60)
GLUCOSE: 108 mg/dL — AB (ref 65–99)
POTASSIUM: 3.4 mmol/L — AB (ref 3.5–5.1)
Sodium: 138 mmol/L (ref 135–145)
TOTAL PROTEIN: 5.8 g/dL — AB (ref 6.5–8.1)

## 2016-05-10 LAB — MRSA PCR SCREENING: MRSA BY PCR: NEGATIVE

## 2016-05-10 LAB — CBC
HCT: 34.5 % — ABNORMAL LOW (ref 39.0–52.0)
HEMOGLOBIN: 12.1 g/dL — AB (ref 13.0–17.0)
MCH: 35.3 pg — AB (ref 26.0–34.0)
MCHC: 35.1 g/dL (ref 30.0–36.0)
MCV: 100.6 fL — ABNORMAL HIGH (ref 78.0–100.0)
PLATELETS: 300 10*3/uL (ref 150–400)
RBC: 3.43 MIL/uL — AB (ref 4.22–5.81)
RDW: 15.4 % (ref 11.5–15.5)
WBC: 6.5 10*3/uL (ref 4.0–10.5)

## 2016-05-10 LAB — PROTIME-INR
INR: 1.05 (ref 0.00–1.49)
PROTHROMBIN TIME: 13.5 s (ref 11.6–15.2)

## 2016-05-10 LAB — GLUCOSE, CAPILLARY: Glucose-Capillary: 102 mg/dL — ABNORMAL HIGH (ref 65–99)

## 2016-05-10 LAB — VITAMIN B12: Vitamin B-12: 447 pg/mL (ref 180–914)

## 2016-05-10 LAB — TROPONIN I
TROPONIN I: 11.59 ng/mL — AB (ref <0.031)
TROPONIN I: 11.85 ng/mL — AB (ref <0.031)

## 2016-05-10 LAB — APTT: APTT: 22 s — AB (ref 24–37)

## 2016-05-10 LAB — C DIFFICILE QUICK SCREEN W PCR REFLEX
C Diff antigen: POSITIVE — AB
C Diff toxin: NEGATIVE

## 2016-05-10 LAB — PHOSPHORUS: Phosphorus: 2.5 mg/dL (ref 2.5–4.6)

## 2016-05-10 LAB — HEPARIN LEVEL (UNFRACTIONATED): Heparin Unfractionated: 0.12 IU/mL — ABNORMAL LOW (ref 0.30–0.70)

## 2016-05-10 LAB — MAGNESIUM: Magnesium: 1.4 mg/dL — ABNORMAL LOW (ref 1.7–2.4)

## 2016-05-10 LAB — TSH: TSH: 2.644 u[IU]/mL (ref 0.350–4.500)

## 2016-05-10 MED ORDER — NITROGLYCERIN 0.4 MG SL SUBL: 0.4000 mg | SUBLINGUAL_TABLET | SUBLINGUAL | Status: DC | PRN

## 2016-05-10 MED ORDER — TAMSULOSIN HCL 0.4 MG PO CAPS
0.4000 mg | ORAL_CAPSULE | Freq: Every day | ORAL | Status: DC
  Administered 2016-05-10 – 2016-05-13 (×4): 0.4 mg via ORAL
  Filled 2016-05-10 (×4): qty 1

## 2016-05-10 MED ORDER — ADULT MULTIVITAMIN W/MINERALS CH
1.0000 | ORAL_TABLET | Freq: Every day | ORAL | Status: DC
  Administered 2016-05-10 – 2016-05-13 (×4): 1 via ORAL
  Filled 2016-05-10 (×4): qty 1

## 2016-05-10 MED ORDER — ONDANSETRON HCL 4 MG/2ML IJ SOLN: 4.0000 mg | Freq: Four times a day (QID) | INTRAMUSCULAR | Status: DC | PRN

## 2016-05-10 MED ORDER — BUPROPION HCL ER (SR) 100 MG PO TB12
200.0000 mg | ORAL_TABLET | Freq: Every day | ORAL | Status: DC
  Administered 2016-05-10: 200 mg via ORAL
  Filled 2016-05-10: qty 2

## 2016-05-10 MED ORDER — SODIUM CHLORIDE 0.9% FLUSH
3.0000 mL | Freq: Two times a day (BID) | INTRAVENOUS | Status: DC
  Administered 2016-05-10 – 2016-05-13 (×5): 3 mL via INTRAVENOUS

## 2016-05-10 MED ORDER — CITALOPRAM HYDROBROMIDE 20 MG PO TABS
20.0000 mg | ORAL_TABLET | Freq: Every day | ORAL | Status: DC
  Administered 2016-05-10 – 2016-05-12 (×3): 20 mg via ORAL
  Filled 2016-05-10 (×3): qty 1

## 2016-05-10 MED ORDER — HYDRALAZINE HCL 20 MG/ML IJ SOLN: 5.0000 mg | INTRAMUSCULAR | Status: DC | PRN

## 2016-05-10 MED ORDER — MORPHINE SULFATE (PF) 2 MG/ML IV SOLN
2.0000 mg | INTRAVENOUS | Status: DC | PRN
  Administered 2016-05-11: 2 mg via INTRAVENOUS
  Filled 2016-05-10: qty 1

## 2016-05-10 MED ORDER — HEPARIN BOLUS VIA INFUSION
4000.0000 [IU] | Freq: Once | INTRAVENOUS | Status: AC
  Administered 2016-05-10: 4000 [IU] via INTRAVENOUS
  Filled 2016-05-10: qty 4000

## 2016-05-10 MED ORDER — ONDANSETRON HCL 4 MG PO TABS: 4.0000 mg | ORAL_TABLET | Freq: Four times a day (QID) | ORAL | Status: DC | PRN

## 2016-05-10 MED ORDER — LORAZEPAM 1 MG PO TABS
1.0000 mg | ORAL_TABLET | Freq: Four times a day (QID) | ORAL | Status: AC | PRN
  Administered 2016-05-11 – 2016-05-13 (×3): 1 mg via ORAL
  Filled 2016-05-10 (×2): qty 1

## 2016-05-10 MED ORDER — TRAMADOL HCL 50 MG PO TABS
50.0000 mg | ORAL_TABLET | Freq: Once | ORAL | Status: AC
  Administered 2016-05-10: 50 mg via ORAL
  Filled 2016-05-10: qty 1

## 2016-05-10 MED ORDER — ENOXAPARIN SODIUM 40 MG/0.4ML ~~LOC~~ SOLN
40.0000 mg | SUBCUTANEOUS | Status: DC
  Administered 2016-05-10 – 2016-05-12 (×3): 40 mg via SUBCUTANEOUS
  Filled 2016-05-10 (×3): qty 0.4

## 2016-05-10 MED ORDER — LORAZEPAM 2 MG/ML IJ SOLN
2.0000 mg | Freq: Once | INTRAMUSCULAR | Status: AC
  Administered 2016-05-10: 2 mg via INTRAVENOUS
  Filled 2016-05-10: qty 1

## 2016-05-10 MED ORDER — HEPARIN (PORCINE) IN NACL 100-0.45 UNIT/ML-% IJ SOLN
1000.0000 [IU]/h | INTRAMUSCULAR | Status: DC
  Administered 2016-05-10: 1000 [IU]/h via INTRAVENOUS
  Filled 2016-05-10: qty 250

## 2016-05-10 MED ORDER — POTASSIUM CHLORIDE 10 MEQ/100ML IV SOLN
10.0000 meq | INTRAVENOUS | Status: AC
  Administered 2016-05-10 (×4): 10 meq via INTRAVENOUS
  Filled 2016-05-10 (×4): qty 100

## 2016-05-10 MED ORDER — LACTULOSE 10 GM/15ML PO SOLN
30.0000 g | Freq: Two times a day (BID) | ORAL | Status: DC
  Administered 2016-05-10 – 2016-05-13 (×6): 30 g via ORAL
  Filled 2016-05-10 (×9): qty 45

## 2016-05-10 MED ORDER — FOLIC ACID 1 MG PO TABS
1.0000 mg | ORAL_TABLET | Freq: Every day | ORAL | Status: DC
  Administered 2016-05-10 – 2016-05-13 (×4): 1 mg via ORAL
  Filled 2016-05-10 (×4): qty 1

## 2016-05-10 MED ORDER — HYDROCODONE-ACETAMINOPHEN 10-325 MG PO TABS: 1.0000 | ORAL_TABLET | ORAL | Status: DC | PRN

## 2016-05-10 MED ORDER — VITAMIN B-1 100 MG PO TABS
100.0000 mg | ORAL_TABLET | Freq: Every day | ORAL | Status: DC
  Administered 2016-05-10 – 2016-05-13 (×4): 100 mg via ORAL
  Filled 2016-05-10 (×4): qty 1

## 2016-05-10 MED ORDER — POTASSIUM CHLORIDE IN NACL 20-0.9 MEQ/L-% IV SOLN
INTRAVENOUS | Status: DC
  Administered 2016-05-10: 50 mL/h via INTRAVENOUS
  Administered 2016-05-11 – 2016-05-12 (×2): via INTRAVENOUS
  Filled 2016-05-10 (×4): qty 1000

## 2016-05-10 MED ORDER — CARVEDILOL 6.25 MG PO TABS
6.2500 mg | ORAL_TABLET | Freq: Two times a day (BID) | ORAL | Status: DC
  Administered 2016-05-10: 6.25 mg via ORAL
  Filled 2016-05-10: qty 1

## 2016-05-10 MED ORDER — CLONIDINE HCL 0.1 MG PO TABS
0.1000 mg | ORAL_TABLET | Freq: Four times a day (QID) | ORAL | Status: DC | PRN
  Administered 2016-05-10: 0.1 mg via ORAL
  Filled 2016-05-10: qty 1

## 2016-05-10 MED ORDER — ACETAMINOPHEN 325 MG PO TABS: 650.0000 mg | ORAL_TABLET | Freq: Four times a day (QID) | ORAL | Status: DC | PRN

## 2016-05-10 MED ORDER — CITALOPRAM HYDROBROMIDE 20 MG PO TABS
20.0000 mg | ORAL_TABLET | Freq: Every day | ORAL | Status: DC
  Filled 2016-05-10: qty 1

## 2016-05-10 MED ORDER — MAGNESIUM GLUCONATE 500 MG PO TABS
500.0000 mg | ORAL_TABLET | Freq: Every day | ORAL | Status: DC
  Administered 2016-05-10: 500 mg via ORAL
  Filled 2016-05-10 (×3): qty 1

## 2016-05-10 MED ORDER — ALPRAZOLAM 0.5 MG PO TABS
0.5000 mg | ORAL_TABLET | Freq: Every day | ORAL | Status: DC
  Administered 2016-05-10: 0.5 mg via ORAL
  Filled 2016-05-10: qty 1

## 2016-05-10 MED ORDER — THIAMINE HCL 100 MG/ML IJ SOLN
500.0000 mg | Freq: Once | INTRAVENOUS | Status: AC
  Administered 2016-05-10: 500 mg via INTRAVENOUS
  Filled 2016-05-10: qty 5

## 2016-05-10 MED ORDER — ASPIRIN EC 81 MG PO TBEC
81.0000 mg | DELAYED_RELEASE_TABLET | Freq: Every day | ORAL | Status: DC
  Administered 2016-05-10 – 2016-05-13 (×4): 81 mg via ORAL
  Filled 2016-05-10 (×4): qty 1

## 2016-05-10 MED ORDER — SODIUM CHLORIDE 0.9 % IV SOLN
INTRAVENOUS | Status: DC
  Administered 2016-05-10: 01:00:00 via INTRAVENOUS

## 2016-05-10 MED ORDER — ATORVASTATIN CALCIUM 40 MG PO TABS
40.0000 mg | ORAL_TABLET | Freq: Every day | ORAL | Status: DC
  Administered 2016-05-10 – 2016-05-12 (×3): 40 mg via ORAL
  Filled 2016-05-10 (×3): qty 1

## 2016-05-10 MED ORDER — ASPIRIN 325 MG PO TABS
325.0000 mg | ORAL_TABLET | Freq: Once | ORAL | Status: AC
Start: 2016-05-10 — End: 2016-05-10
  Administered 2016-05-10: 325 mg via ORAL
  Filled 2016-05-10: qty 1

## 2016-05-10 MED ORDER — HYDROCODONE-ACETAMINOPHEN 5-325 MG PO TABS
1.0000 | ORAL_TABLET | ORAL | Status: DC | PRN
  Administered 2016-05-12 – 2016-05-13 (×4): 2 via ORAL
  Filled 2016-05-10 (×4): qty 2

## 2016-05-10 MED ORDER — MORPHINE SULFATE (PF) 2 MG/ML IV SOLN: 1.0000 mg | INTRAVENOUS | Status: DC | PRN

## 2016-05-10 MED ORDER — CITALOPRAM HYDROBROMIDE 20 MG PO TABS
40.0000 mg | ORAL_TABLET | Freq: Every day | ORAL | Status: DC
  Administered 2016-05-10: 40 mg via ORAL
  Filled 2016-05-10 (×3): qty 1

## 2016-05-10 MED ORDER — ACETAMINOPHEN 650 MG RE SUPP: 650.0000 mg | Freq: Four times a day (QID) | RECTAL | Status: DC | PRN

## 2016-05-10 MED ORDER — CARVEDILOL 12.5 MG PO TABS
12.5000 mg | ORAL_TABLET | Freq: Two times a day (BID) | ORAL | Status: DC
  Administered 2016-05-10 – 2016-05-13 (×6): 12.5 mg via ORAL
  Filled 2016-05-10 (×6): qty 1

## 2016-05-10 MED ORDER — LORAZEPAM 2 MG/ML IJ SOLN
1.0000 mg | Freq: Four times a day (QID) | INTRAMUSCULAR | Status: AC | PRN
  Administered 2016-05-10 – 2016-05-11 (×3): 1 mg via INTRAVENOUS
  Filled 2016-05-10 (×3): qty 1

## 2016-05-10 MED ORDER — BUPROPION HCL ER (SR) 100 MG PO TB12
100.0000 mg | ORAL_TABLET | Freq: Two times a day (BID) | ORAL | Status: DC
  Administered 2016-05-11 – 2016-05-13 (×5): 100 mg via ORAL
  Filled 2016-05-10 (×8): qty 1

## 2016-05-10 NOTE — H&P (Deleted)
Name: Joshua Henson is a 70 y.o. male Admit date: 05/09/2016 Referring Physician:  Angela Burke, NP Primary Physician:  Dr. Mayra Neer Primary Cardiologist:  Dr. Acie Fredrickson  Reason for Consultation:  Elevated troponins  ASSESSMENT: 70 y/o M w/ PMHx of EtOH abuse, CVA, HTN, CAD, and polysubstance abuse who presented w/ alcoholic encephalopathy, found to have elevated troponin level. He denies chest pain and EKG unchanged from priors w/out signs of ischemia. Likely troponins elevated due to demand ischemia from demand ischemia 2/2 intoxication as he was tachycardic w/ BP up to 173/112 on presentation.   PLAN: 1. Elevated troponins - in setting of known CAD by [facility] January 2017 w/ 60-70% stenosis of mid segment of the non dominant RCA. Likely due to demand ischemia, continue to trend down tropoinins, if patient develops chest pain can consider repeat LHC. Canceled ECHO.  - continue statin, BB, and asa 2. HTN- slightly elevated likely in setting of alcohol withdrawal, last CIWA score 15. Will increase coreg to 12.5mg  BID.  3. EtOH encephalopathy-- currently on CIWA     HPI: [name] is a 70 y/o M w/ past medical history of prostate cancer s/p radiation tx, CVA, HTN, CAD, hepatic steatosis, and polysubstance abuse who presented to the ED on [id number] with alcoholic encephalopathy. His UDS on admission was positive for opiates and benzodiazepines, ethanol level 9, CT head negative for hemorrhage, CXR positive for small left pleural effusion. His troponin level was elevated at 11.59 with second set at 11.82. Previously admitted in January 2017 for alcohol intoxication requiring intubation for DTs w/ peak troponin level of 4.59. He underwent left heart catheterization that revealed moderately severe stenosis (60-70%) in the mid segment of the non-dominant RCA.; and mild non-obstructive disease in the LAD and Circumflex. During that time it was felt troponins were elevated secondary to demand ischemia as he  was not having chest pain nor had exertional chest pain while at home. He was continued on asa, beta blocker, and a statin.   Pt currently denies any chest pain, palpitations, or shortness of breath. He is alert and oriented x 3, although has received xanax and ativan earlier. He states he fell last week resulting in left black eye. He is unsure of what medications he takes at home. He drinks around 3 glasses of vokda daily and smokes 1/2 PPD of cigarettes for the past 30 years. Denies family hx of cardiac disease.    PMH:   Past Medical History  Diagnosis Date  . Prostate cancer ([place]) 2010    External beam radiation (urol - Risa Grill, XRT Valere Dross)  . Stroke ([place])   . Depression   . Tachycardia   . Hypertension   . Insomnia   . CAD (coronary artery disease), native coronary artery 01/10/2016    3 vessel coronary calcification noted on CT scan 04/03/15    . Fatty liver, alcoholic [id number]  . Acute encephalopathy   . NSTEMI (non-ST elevated myocardial infarction) (HCC)     PSH:   Past Surgical History  Procedure Laterality Date  . None    . Cardiac catheterization N/A 01/15/2016    Procedure: Left Heart Cath and Coronary Angiography;  Surgeon: Burnell Blanks, MD;  Location: [facility];  Service: Cardiovascular;  Laterality: N/A;   Allergies:  Lisinopril and Celebrex Prior to Admit Meds:   Prescriptions prior to admission  Medication Sig Dispense Refill Last Dose  . ALPRAZolam (XANAX) 0.5 MG tablet Take 0.5-0.75 mg by mouth  at bedtime.    Past Week at Unknown time  . aspirin 81 MG EC tablet Take 1 tablet (81 mg total) by mouth daily. 30 tablet 12 Past Week at Unknown time  . atorvastatin (LIPITOR) 40 MG tablet Take 1 tablet (40 mg total) by mouth daily at 6 PM. 30 tablet 0 Past Week at Unknown time  . buPROPion (WELLBUTRIN SR) 100 MG 12 hr tablet Take 200 mg by mouth daily.   0 Past Week at Unknown time  . carvedilol (COREG) 6.25 MG tablet Take 6.25 mg by mouth 2 (two)  times daily with a meal.   Past Week at Unknown time  . cholecalciferol (VITAMIN D) 1000 units tablet Take 2,000 Units by mouth daily.   Past Week at Unknown time  . citalopram (CELEXA) 20 MG tablet Take 20 mg by mouth daily.   Past Week at Unknown time  . Coenzyme Q10 (CO Q-10) 200 MG CAPS Take 200 mg by mouth daily.   Past Week at Unknown time  . folic acid (FOLVITE) 1 MG tablet Take 1 tablet (1 mg total) by mouth daily. 30 tablet 1 Past Week at Unknown time  . HYDROcodone-acetaminophen (NORCO) 10-325 MG tablet Take 1 tablet by mouth every 4 (four) hours as needed. pain  0 05/09/2016 at Unknown time  . LORazepam (ATIVAN) 1 MG tablet Take 1 mg by mouth 2 (two) times daily as needed. anxiety  0 05/09/2016 at Unknown time  . Magnesium 500 MG CAPS Take 500 mg by mouth at bedtime.   Past Week at Unknown time  . Omega-3 Fatty Acids (FISH OIL) 1000 MG CAPS Take 1,000 mg by mouth 2 (two) times daily.    Past Week at Unknown time  . tamsulosin (FLOMAX) 0.4 MG CAPS capsule Take 0.4 mg by mouth daily.   0 Past Week at Unknown time  . thiamine (VITAMIN B-1) 100 MG tablet Take 100 mg by mouth daily.   Past Week at Unknown time  . citalopram (CELEXA) 40 MG tablet Take 40 mg by mouth at bedtime. Reported on 05/09/2016   Not Taking at Unknown time   Fam HX:    Family History  Problem Relation Age of Onset  . Cancer Mother     esophageal  . Hypertension Mother   . Diabetes Mother   . Cancer Father     prostate  . Heart disease Maternal Grandfather     MI  . Cancer Paternal Grandfather     prostate   Social HX:    Social History   Social History  . Marital Status: Divorced    Spouse Name: N/A  . Number of Children: 3  . Years of Education: 16   Occupational History  . lawyer    Social History Main Topics  . Smoking status: Current Every Day Smoker -- 0.50 packs/day for 30 years    Types: Cigarettes  . Smokeless tobacco: Never Used  . Alcohol Use: 33.6 oz/week    56 Shots of liquor per week       Comment: last drink yesterday  . Drug Use: No     Comment: Cocaine intermittently  . Sexual Activity: Yes   Other Topics Concern  . Not on file   Social History Narrative   [employer], [employer], [place]. Married 22 yrs yrs - divorced. Twin boys - 19; 1 dtr - '94. Pension scheme manager.      Review of Systems: Pertinent ROS noted in HPI, limited due  to lethargy. All other systems are negative.  Physical Exam: Blood pressure 157/106, pulse 73, temperature 97.9 F (36.6 C), temperature source Oral, resp. rate 20, height 5\' 10"  (1.778 m), weight 180 lb (81.647 kg), SpO2 100 %. Weight change:   Physical Exam  Constitutional: He is oriented to person, place, and time. He appears well-developed and well-nourished. No distress.  HENT:  Head: Normocephalic.  Nose: Nose normal.  Eyes: EOM are normal. No scleral icterus.  Bruising around left eye   Cardiovascular: Normal rate, regular rhythm and normal heart sounds.  Exam reveals no gallop and no friction rub.   No murmur heard. Pulmonary/Chest: Effort normal and breath sounds normal. No respiratory distress. He has no wheezes. He has no rales. He exhibits tenderness (left chest wall tenderness).  Abdominal: Soft. Bowel sounds are normal. He exhibits no distension and no mass. There is no tenderness. There is no rebound and no guarding.  Musculoskeletal: He exhibits no edema.  Neurological: He is alert and oriented to person, place, and time.  Skin: Skin is warm and dry. He is not diaphoretic.  Rt knee abrasion    Labs: Lab Results  Component Value Date   WBC 6.5 05/10/2016   HGB 12.1* 05/10/2016   HCT 34.5* 05/10/2016   MCV 100.6* 05/10/2016   PLT 300 05/10/2016    Recent Labs Lab 05/10/16 0456  NA 138  K 3.4*  CL 109  CO2 23  BUN 8  CREATININE 0.66  CALCIUM 8.3*  PROT 5.8*  BILITOT 1.1  ALKPHOS 166*  ALT 17  AST 28  GLUCOSE 108*   No results found for: PTT Lab Results  Component Value Date   INR 1.05  05/10/2016   INR 1.11 03/28/2016   INR 1.00 02/16/2016   Lab Results  Component Value Date   CKTOTAL 82 01/11/2016   CKMB 1.3 01/10/2016   TROPONINI 11.85* 05/10/2016     Lab Results  Component Value Date   CHOL 118 07/04/2014   Lab Results  Component Value Date   HDL 57 07/04/2014   Lab Results  Component Value Date   LDLCALC 46 07/04/2014   Lab Results  Component Value Date   TRIG 77 07/04/2014   Lab Results  Component Value Date   CHOLHDL 2.1 07/04/2014   No results found for: LDLDIRECT    Radiology:  Ct Abdomen Pelvis Wo Contrast  05/10/2016  CLINICAL DATA:  Abdominal pain post fall a few days prior EXAM: CT ABDOMEN AND PELVIS WITHOUT CONTRAST TECHNIQUE: Multidetector CT imaging of the abdomen and pelvis was performed following the standard protocol without IV contrast. COMPARISON:  CT 02/16/2016 FINDINGS: Lower chest: Small left pleural effusion, fluid measures simple fluid density. Mild atelectasis in both lower lobes. Liver: Partially obscured by motion.  No evidence of focal lesion. Hepatobiliary: Gallbladder physiologically distended, partially obscured by motion. No calcified stone. Difficult to exclude wall thickening. Pancreas: Partially obscured by motion. No evidence of traumatic injury. Atrophic parenchymal. Spleen: Splenic granuloma. No perisplenic fluid. No evidence of traumatic injury allowing for lack contrast. Adrenal glands: No hemorrhage or nodule. Kidneys: Bilateral nonspecific perinephric edema, stable from prior. No hydronephrosis. No focal abnormality. Stomach/Bowel: Stomach physiologically distended. There are no dilated or thickened small bowel loops. Small-moderate volume of stool throughout the colon without colonic wall thickening. Distal colonic diverticulosis without diverticulitis. The appendix is normal. Vascular/Lymphatic: No retroperitoneal fluid, bleed, or adenopathy. Abdominal aorta is normal in caliber. Reproductive: Prominent sized prostate  gland with mass effect on  the bladder base. Brachytherapy seeds. Bladder: Physiologically distended, no wall thickening. Other: No free air, free fluid, or intra-abdominal fluid collection. Fat within both inguinal canals. Tiny fat containing umbilical hernia. Musculoskeletal: Left anterior rib fractures are remote. There are no acute or suspicious osseous abnormalities. Motion artifact limits detailed assessment. IMPRESSION: No acute or traumatic abnormality in the abdomen or pelvis allowing for patient motion and lack of intravenous contrast. Small left pleural effusion measures simple fluid density. Electronically Signed   By: Jeb Levering M.D.   On: 05/10/2016 03:49   Dg Chest 2 View  05/09/2016  CLINICAL DATA:  Fall 5 days prior with altered mental status. EXAM: CHEST  2 VIEW COMPARISON:  Most recent chest radiograph 03/23/2016. Additional priors radiographs reviewed. FINDINGS: The cardiomediastinal contours are unchanged, heart at the upper limits of normal in size. There is thickening of the paratracheal stripes, stable and likely related to overlapping vascular structures. Obscuration of the left hemidiaphragm secondary to small left pleural effusion No pulmonary edema. No pneumothorax. No acute osseous abnormalities are seen. IMPRESSION: Small left pleural effusion. No pneumothorax or evidence of displaced rib fracture. Electronically Signed   By: Jeb Levering M.D.   On: 05/09/2016 21:29   Ct Head Wo Contrast  05/09/2016  CLINICAL DATA:  Initial evaluation for acute fall 5 days ago. Bruising to left thigh. EXAM: CT HEAD WITHOUT CONTRAST CT MAXILLOFACIAL WITHOUT CONTRAST TECHNIQUE: Multidetector CT imaging of the head and maxillofacial structures were performed using the standard protocol without intravenous contrast. Multiplanar CT image reconstructions of the maxillofacial structures were also generated. COMPARISON:  None. FINDINGS: CT HEAD FINDINGS Diffuse prominence of the CSF containing  spaces compatible with generalized cerebral atrophy. Mild to moderate chronic small vessel ischemic disease present. Scattered vascular calcifications within the carotid siphons. No acute intracranial hemorrhage. No acute large vessel territory infarct. No mass lesion, midline shift, or mass effect para ventricular prominence related to global parenchymal volume loss present without hydrocephalus. No extra-axial fluid collection. Left periorbital contusion. Calcification at the anterior aspect of the left globe. Globes themselves grossly intact. No retro-orbital process. Scalp soft tissues otherwise normal. No mastoid effusion. Calvarium intact. CT MAXILLOFACIAL FINDINGS Left periorbital contusion/ laceration. Two adjacent radiopaque calcific densities present anterior to the left globe measuring up to 7 mm (series 5, image 58). These are suspicious for retained debris. Left globe itself intact. Right globe normal. No retro-orbital hematoma or other pathology. Mild left facial contusion as well. No orbital floor fracture. Small focal old defect in the left lamina papyracea is new relative to prior exam, and likely reflects a small acute fracture (series 9, image 33). Associated 2 mm of displacement. Orbital roofs and lateral orbital walls are intact. Zygomatic arches intact. No acute maxillary fracture. Pterygoid plates intact. No acute nasal bone fracture. Nasal septum intact. Mandible intact. Mandibular condyles mildly subluxed anteriorly relative to the temporomandibular fossa, likely related to jaw positioning. Poor dentition noted. No acute abnormality about the dentition itself. Chronic bilateral maxillary sinus disease again noted, stable. Minimal mucosal thickening within the ethmoidal air cells. Paranasal sinuses are otherwise clear. Visualized upper cervical spine grossly intact. IMPRESSION: CT HEAD: 1. No acute intracranial process. 2. Stable atrophy with chronic small vessel ischemic disease. CT  MAXILLOFACIAL: 1. Left periorbital/facial contusion with left periorbital laceration. Scattered radiopaque densities anterior to the left globe likely reflect retained debris. Globe itself intact with no retro-orbital pathology. 2. Acute fracture of the left lamina papyracea with 2 mm of displacement. While this fracture  somewhat age indeterminate in appearance, this is new relative to most recent CT from 03/22/2016, and is suspected to have occurred with this most recent trauma 3. Chronic maxillary sinusitis. Electronically Signed   By: Jeannine Boga M.D.   On: 05/09/2016 22:00   Ct Maxillofacial Wo Cm  05/09/2016  CLINICAL DATA:  Initial evaluation for acute fall 5 days ago. Bruising to left thigh. EXAM: CT HEAD WITHOUT CONTRAST CT MAXILLOFACIAL WITHOUT CONTRAST TECHNIQUE: Multidetector CT imaging of the head and maxillofacial structures were performed using the standard protocol without intravenous contrast. Multiplanar CT image reconstructions of the maxillofacial structures were also generated. COMPARISON:  None. FINDINGS: CT HEAD FINDINGS Diffuse prominence of the CSF containing spaces compatible with generalized cerebral atrophy. Mild to moderate chronic small vessel ischemic disease present. Scattered vascular calcifications within the carotid siphons. No acute intracranial hemorrhage. No acute large vessel territory infarct. No mass lesion, midline shift, or mass effect para ventricular prominence related to global parenchymal volume loss present without hydrocephalus. No extra-axial fluid collection. Left periorbital contusion. Calcification at the anterior aspect of the left globe. Globes themselves grossly intact. No retro-orbital process. Scalp soft tissues otherwise normal. No mastoid effusion. Calvarium intact. CT MAXILLOFACIAL FINDINGS Left periorbital contusion/ laceration. Two adjacent radiopaque calcific densities present anterior to the left globe measuring up to 7 mm (series 5, image  58). These are suspicious for retained debris. Left globe itself intact. Right globe normal. No retro-orbital hematoma or other pathology. Mild left facial contusion as well. No orbital floor fracture. Small focal old defect in the left lamina papyracea is new relative to prior exam, and likely reflects a small acute fracture (series 9, image 33). Associated 2 mm of displacement. Orbital roofs and lateral orbital walls are intact. Zygomatic arches intact. No acute maxillary fracture. Pterygoid plates intact. No acute nasal bone fracture. Nasal septum intact. Mandible intact. Mandibular condyles mildly subluxed anteriorly relative to the temporomandibular fossa, likely related to jaw positioning. Poor dentition noted. No acute abnormality about the dentition itself. Chronic bilateral maxillary sinus disease again noted, stable. Minimal mucosal thickening within the ethmoidal air cells. Paranasal sinuses are otherwise clear. Visualized upper cervical spine grossly intact. IMPRESSION: CT HEAD: 1. No acute intracranial process. 2. Stable atrophy with chronic small vessel ischemic disease. CT MAXILLOFACIAL: 1. Left periorbital/facial contusion with left periorbital laceration. Scattered radiopaque densities anterior to the left globe likely reflect retained debris. Globe itself intact with no retro-orbital pathology. 2. Acute fracture of the left lamina papyracea with 2 mm of displacement. While this fracture somewhat age indeterminate in appearance, this is new relative to most recent CT from 03/22/2016, and is suspected to have occurred with this most recent trauma 3. Chronic maxillary sinusitis. Electronically Signed   By: Jeannine Boga M.D.   On: 05/09/2016 22:00    EKG:  Sinus rhythm, unchanged from priors.     Julious Oka 05/10/2016 9:58 AM

## 2016-05-10 NOTE — Consult Note (Signed)
Name: Joshua Henson is a 70 y.o. male Admit date: 05/09/2016 Referring Physician:  Angela Burke, NP Primary Physician:  Dr. Mayra Neer Primary Cardiologist:  Dr. Acie Fredrickson  Reason for Consultation:  Elevated troponins  ASSESSMENT: 70 y/o M w/ PMHx of EtOH abuse, CVA, HTN, CAD, and polysubstance abuse who presented w/ alcoholic encephalopathy, found to have elevated troponin level. He denies chest pain and EKG unchanged from priors w/out signs of ischemia. Likely troponins elevated due to demand ischemia from demand ischemia 2/2 intoxication as he was tachycardic w/ BP up to 173/112 on presentation.   PLAN: 1. Elevated troponins - in setting of known CAD by [facility] January 2017 w/ 60-70% stenosis of mid segment of the non dominant RCA. Likely due to demand ischemia, continue to trend down tropoinins, if patient develops chest pain can consider repeat LHC. Canceled ECHO.  - continue statin, BB, and asa 2. HTN- slightly elevated likely in setting of alcohol withdrawal, last CIWA score 15. Will increase coreg to 12.5mg  BID.  3. EtOH encephalopathy-- currently on CIWA     HPI: Joshua Henson is a 70 y/o M w/ past medical history of prostate cancer s/p radiation tx, CVA, HTN, CAD, hepatic steatosis, and polysubstance abuse who presented to the ED on [id number] with alcoholic encephalopathy. His UDS on admission was positive for opiates and benzodiazepines, ethanol level 9, CT head negative for hemorrhage, CXR positive for small left pleural effusion. His troponin level was elevated at 11.59 with second set at 11.82. Previously admitted in January 2017 for alcohol intoxication requiring intubation for DTs w/ peak troponin level of 4.59. He underwent left heart catheterization that revealed moderately severe stenosis (60-70%) in the mid segment of the non-dominant RCA.; and mild non-obstructive disease in the LAD and Circumflex. During that time it was felt troponins were elevated secondary to demand ischemia as he  was not having chest pain nor had exertional chest pain while at home. He was continued on asa, beta blocker, and a statin.   Pt currently denies any chest pain, palpitations, or shortness of breath. He is alert and oriented x 3, although has received xanax and ativan earlier. He states he fell last week resulting in left black eye. He is unsure of what medications he takes at home. He drinks around 3 glasses of vokda daily and smokes 1/2 PPD of cigarettes for the past 30 years. Denies family hx of cardiac disease.    PMH:   Past Medical History  Diagnosis Date  . Prostate cancer ([place]) 2010    External beam radiation (urol - Risa Grill, XRT Valere Dross)  . Stroke ([place])   . Depression   . Tachycardia   . Hypertension   . Insomnia   . CAD (coronary artery disease), native coronary artery 01/10/2016    3 vessel coronary calcification noted on CT scan 04/03/15    . Fatty liver, alcoholic [id number]  . Acute encephalopathy   . NSTEMI (non-ST elevated myocardial infarction) (HCC)     PSH:   Past Surgical History  Procedure Laterality Date  . None    . Cardiac catheterization N/A 01/15/2016    Procedure: Left Heart Cath and Coronary Angiography;  Surgeon: Burnell Blanks, MD;  Location: [facility];  Service: Cardiovascular;  Laterality: N/A;   Allergies:  Lisinopril and Celebrex Prior to Admit Meds:   Prescriptions prior to admission  Medication Sig Dispense Refill Last Dose  . ALPRAZolam (XANAX) 0.5 MG tablet Take 0.5-0.75 mg by mouth  at bedtime.    Past Week at Unknown time  . aspirin 81 MG EC tablet Take 1 tablet (81 mg total) by mouth daily. 30 tablet 12 Past Week at Unknown time  . atorvastatin (LIPITOR) 40 MG tablet Take 1 tablet (40 mg total) by mouth daily at 6 PM. 30 tablet 0 Past Week at Unknown time  . buPROPion (WELLBUTRIN SR) 100 MG 12 hr tablet Take 200 mg by mouth daily.   0 Past Week at Unknown time  . carvedilol (COREG) 6.25 MG tablet Take 6.25 mg by mouth 2 (two)  times daily with a meal.   Past Week at Unknown time  . cholecalciferol (VITAMIN D) 1000 units tablet Take 2,000 Units by mouth daily.   Past Week at Unknown time  . citalopram (CELEXA) 20 MG tablet Take 20 mg by mouth daily.   Past Week at Unknown time  . Coenzyme Q10 (CO Q-10) 200 MG CAPS Take 200 mg by mouth daily.   Past Week at Unknown time  . folic acid (FOLVITE) 1 MG tablet Take 1 tablet (1 mg total) by mouth daily. 30 tablet 1 Past Week at Unknown time  . HYDROcodone-acetaminophen (NORCO) 10-325 MG tablet Take 1 tablet by mouth every 4 (four) hours as needed. pain  0 05/09/2016 at Unknown time  . LORazepam (ATIVAN) 1 MG tablet Take 1 mg by mouth 2 (two) times daily as needed. anxiety  0 05/09/2016 at Unknown time  . Magnesium 500 MG CAPS Take 500 mg by mouth at bedtime.   Past Week at Unknown time  . Omega-3 Fatty Acids (FISH OIL) 1000 MG CAPS Take 1,000 mg by mouth 2 (two) times daily.    Past Week at Unknown time  . tamsulosin (FLOMAX) 0.4 MG CAPS capsule Take 0.4 mg by mouth daily.   0 Past Week at Unknown time  . thiamine (VITAMIN B-1) 100 MG tablet Take 100 mg by mouth daily.   Past Week at Unknown time  . citalopram (CELEXA) 40 MG tablet Take 40 mg by mouth at bedtime. Reported on 05/09/2016   Not Taking at Unknown time   Fam HX:    Family History  Problem Relation Age of Onset  . Cancer Mother     esophageal  . Hypertension Mother   . Diabetes Mother   . Cancer Father     prostate  . Heart disease Maternal Grandfather     MI  . Cancer Paternal Grandfather     prostate   Social HX:    Social History   Social History  . Marital Status: Divorced    Spouse Name: N/A  . Number of Children: 3  . Years of Education: 16   Occupational History  . lawyer    Social History Main Topics  . Smoking status: Current Every Day Smoker -- 0.50 packs/day for 30 years    Types: Cigarettes  . Smokeless tobacco: Never Used  . Alcohol Use: 33.6 oz/week    56 Shots of liquor per week       Comment: last drink yesterday  . Drug Use: No     Comment: Cocaine intermittently  . Sexual Activity: Yes   Other Topics Concern  . Not on file   Social History Narrative   HSG, [employer], [place]. Married 22 yrs yrs - divorced. Twin boys - 105; 1 dtr - '94. Pension scheme manager.      Review of Systems: Pertinent ROS noted in HPI, limited due  to lethargy. All other systems are negative.  Physical Exam: Blood pressure 157/106, pulse 73, temperature 97.9 F (36.6 C), temperature source Oral, resp. rate 20, height 5\' 10"  (1.778 m), weight 180 lb (81.647 kg), SpO2 100 %. Weight change:   Physical Exam  Constitutional: He is oriented to person, place, and time. He appears well-developed and well-nourished. No distress.  HENT:  Head: Normocephalic.  Nose: Nose normal.  Eyes: EOM are normal. No scleral icterus.  Bruising around left eye   Cardiovascular: Normal rate, regular rhythm and normal heart sounds.  Exam reveals no gallop and no friction rub.   No murmur heard. Pulmonary/Chest: Effort normal and breath sounds normal. No respiratory distress. He has no wheezes. He has no rales. He exhibits tenderness (left chest wall tenderness).  Abdominal: Soft. Bowel sounds are normal. He exhibits no distension and no mass. There is no tenderness. There is no rebound and no guarding.  Musculoskeletal: He exhibits no edema.  Neurological: He is alert and oriented to person, place, and time.  Skin: Skin is warm and dry. He is not diaphoretic.  Rt knee abrasion    Labs: Lab Results  Component Value Date   WBC 6.5 05/10/2016   HGB 12.1* 05/10/2016   HCT 34.5* 05/10/2016   MCV 100.6* 05/10/2016   PLT 300 05/10/2016    Recent Labs Lab 05/10/16 0456  NA 138  K 3.4*  CL 109  CO2 23  BUN 8  CREATININE 0.66  CALCIUM 8.3*  PROT 5.8*  BILITOT 1.1  ALKPHOS 166*  ALT 17  AST 28  GLUCOSE 108*   No results found for: PTT Lab Results  Component Value Date   INR 1.05  05/10/2016   INR 1.11 03/28/2016   INR 1.00 02/16/2016   Lab Results  Component Value Date   CKTOTAL 82 01/11/2016   CKMB 1.3 01/10/2016   TROPONINI 11.85* 05/10/2016     Lab Results  Component Value Date   CHOL 118 07/04/2014   Lab Results  Component Value Date   HDL 57 07/04/2014   Lab Results  Component Value Date   LDLCALC 46 07/04/2014   Lab Results  Component Value Date   TRIG 77 07/04/2014   Lab Results  Component Value Date   CHOLHDL 2.1 07/04/2014   No results found for: LDLDIRECT    Radiology:  Ct Abdomen Pelvis Wo Contrast  05/10/2016  CLINICAL DATA:  Abdominal pain post fall a few days prior EXAM: CT ABDOMEN AND PELVIS WITHOUT CONTRAST TECHNIQUE: Multidetector CT imaging of the abdomen and pelvis was performed following the standard protocol without IV contrast. COMPARISON:  CT 02/16/2016 FINDINGS: Lower chest: Small left pleural effusion, fluid measures simple fluid density. Mild atelectasis in both lower lobes. Liver: Partially obscured by motion.  No evidence of focal lesion. Hepatobiliary: Gallbladder physiologically distended, partially obscured by motion. No calcified stone. Difficult to exclude wall thickening. Pancreas: Partially obscured by motion. No evidence of traumatic injury. Atrophic parenchymal. Spleen: Splenic granuloma. No perisplenic fluid. No evidence of traumatic injury allowing for lack contrast. Adrenal glands: No hemorrhage or nodule. Kidneys: Bilateral nonspecific perinephric edema, stable from prior. No hydronephrosis. No focal abnormality. Stomach/Bowel: Stomach physiologically distended. There are no dilated or thickened small bowel loops. Small-moderate volume of stool throughout the colon without colonic wall thickening. Distal colonic diverticulosis without diverticulitis. The appendix is normal. Vascular/Lymphatic: No retroperitoneal fluid, bleed, or adenopathy. Abdominal aorta is normal in caliber. Reproductive: Prominent sized prostate  gland with mass effect on  the bladder base. Brachytherapy seeds. Bladder: Physiologically distended, no wall thickening. Other: No free air, free fluid, or intra-abdominal fluid collection. Fat within both inguinal canals. Tiny fat containing umbilical hernia. Musculoskeletal: Left anterior rib fractures are remote. There are no acute or suspicious osseous abnormalities. Motion artifact limits detailed assessment. IMPRESSION: No acute or traumatic abnormality in the abdomen or pelvis allowing for patient motion and lack of intravenous contrast. Small left pleural effusion measures simple fluid density. Electronically Signed   By: Jeb Levering M.D.   On: 05/10/2016 03:49   Dg Chest 2 View  05/09/2016  CLINICAL DATA:  Fall 5 days prior with altered mental status. EXAM: CHEST  2 VIEW COMPARISON:  Most recent chest radiograph 03/23/2016. Additional priors radiographs reviewed. FINDINGS: The cardiomediastinal contours are unchanged, heart at the upper limits of normal in size. There is thickening of the paratracheal stripes, stable and likely related to overlapping vascular structures. Obscuration of the left hemidiaphragm secondary to small left pleural effusion No pulmonary edema. No pneumothorax. No acute osseous abnormalities are seen. IMPRESSION: Small left pleural effusion. No pneumothorax or evidence of displaced rib fracture. Electronically Signed   By: Jeb Levering M.D.   On: 05/09/2016 21:29   Ct Head Wo Contrast  05/09/2016  CLINICAL DATA:  Initial evaluation for acute fall 5 days ago. Bruising to left thigh. EXAM: CT HEAD WITHOUT CONTRAST CT MAXILLOFACIAL WITHOUT CONTRAST TECHNIQUE: Multidetector CT imaging of the head and maxillofacial structures were performed using the standard protocol without intravenous contrast. Multiplanar CT image reconstructions of the maxillofacial structures were also generated. COMPARISON:  None. FINDINGS: CT HEAD FINDINGS Diffuse prominence of the CSF containing  spaces compatible with generalized cerebral atrophy. Mild to moderate chronic small vessel ischemic disease present. Scattered vascular calcifications within the carotid siphons. No acute intracranial hemorrhage. No acute large vessel territory infarct. No mass lesion, midline shift, or mass effect para ventricular prominence related to global parenchymal volume loss present without hydrocephalus. No extra-axial fluid collection. Left periorbital contusion. Calcification at the anterior aspect of the left globe. Globes themselves grossly intact. No retro-orbital process. Scalp soft tissues otherwise normal. No mastoid effusion. Calvarium intact. CT MAXILLOFACIAL FINDINGS Left periorbital contusion/ laceration. Two adjacent radiopaque calcific densities present anterior to the left globe measuring up to 7 mm (series 5, image 58). These are suspicious for retained debris. Left globe itself intact. Right globe normal. No retro-orbital hematoma or other pathology. Mild left facial contusion as well. No orbital floor fracture. Small focal old defect in the left lamina papyracea is new relative to prior exam, and likely reflects a small acute fracture (series 9, image 33). Associated 2 mm of displacement. Orbital roofs and lateral orbital walls are intact. Zygomatic arches intact. No acute maxillary fracture. Pterygoid plates intact. No acute nasal bone fracture. Nasal septum intact. Mandible intact. Mandibular condyles mildly subluxed anteriorly relative to the temporomandibular fossa, likely related to jaw positioning. Poor dentition noted. No acute abnormality about the dentition itself. Chronic bilateral maxillary sinus disease again noted, stable. Minimal mucosal thickening within the ethmoidal air cells. Paranasal sinuses are otherwise clear. Visualized upper cervical spine grossly intact. IMPRESSION: CT HEAD: 1. No acute intracranial process. 2. Stable atrophy with chronic small vessel ischemic disease. CT  MAXILLOFACIAL: 1. Left periorbital/facial contusion with left periorbital laceration. Scattered radiopaque densities anterior to the left globe likely reflect retained debris. Globe itself intact with no retro-orbital pathology. 2. Acute fracture of the left lamina papyracea with 2 mm of displacement. While this fracture  somewhat age indeterminate in appearance, this is new relative to most recent CT from 03/22/2016, and is suspected to have occurred with this most recent trauma 3. Chronic maxillary sinusitis. Electronically Signed   By: Jeannine Boga M.D.   On: 05/09/2016 22:00   Ct Maxillofacial Wo Cm  05/09/2016  CLINICAL DATA:  Initial evaluation for acute fall 5 days ago. Bruising to left thigh. EXAM: CT HEAD WITHOUT CONTRAST CT MAXILLOFACIAL WITHOUT CONTRAST TECHNIQUE: Multidetector CT imaging of the head and maxillofacial structures were performed using the standard protocol without intravenous contrast. Multiplanar CT image reconstructions of the maxillofacial structures were also generated. COMPARISON:  None. FINDINGS: CT HEAD FINDINGS Diffuse prominence of the CSF containing spaces compatible with generalized cerebral atrophy. Mild to moderate chronic small vessel ischemic disease present. Scattered vascular calcifications within the carotid siphons. No acute intracranial hemorrhage. No acute large vessel territory infarct. No mass lesion, midline shift, or mass effect para ventricular prominence related to global parenchymal volume loss present without hydrocephalus. No extra-axial fluid collection. Left periorbital contusion. Calcification at the anterior aspect of the left globe. Globes themselves grossly intact. No retro-orbital process. Scalp soft tissues otherwise normal. No mastoid effusion. Calvarium intact. CT MAXILLOFACIAL FINDINGS Left periorbital contusion/ laceration. Two adjacent radiopaque calcific densities present anterior to the left globe measuring up to 7 mm (series 5, image  58). These are suspicious for retained debris. Left globe itself intact. Right globe normal. No retro-orbital hematoma or other pathology. Mild left facial contusion as well. No orbital floor fracture. Small focal old defect in the left lamina papyracea is new relative to prior exam, and likely reflects a small acute fracture (series 9, image 33). Associated 2 mm of displacement. Orbital roofs and lateral orbital walls are intact. Zygomatic arches intact. No acute maxillary fracture. Pterygoid plates intact. No acute nasal bone fracture. Nasal septum intact. Mandible intact. Mandibular condyles mildly subluxed anteriorly relative to the temporomandibular fossa, likely related to jaw positioning. Poor dentition noted. No acute abnormality about the dentition itself. Chronic bilateral maxillary sinus disease again noted, stable. Minimal mucosal thickening within the ethmoidal air cells. Paranasal sinuses are otherwise clear. Visualized upper cervical spine grossly intact. IMPRESSION: CT HEAD: 1. No acute intracranial process. 2. Stable atrophy with chronic small vessel ischemic disease. CT MAXILLOFACIAL: 1. Left periorbital/facial contusion with left periorbital laceration. Scattered radiopaque densities anterior to the left globe likely reflect retained debris. Globe itself intact with no retro-orbital pathology. 2. Acute fracture of the left lamina papyracea with 2 mm of displacement. While this fracture somewhat age indeterminate in appearance, this is new relative to most recent CT from 03/22/2016, and is suspected to have occurred with this most recent trauma 3. Chronic maxillary sinusitis. Electronically Signed   By: Jeannine Boga M.D.   On: 05/09/2016 22:00    EKG:  Sinus rhythm, unchanged from priors.     Julious Oka 05/10/2016 9:58 AM  Patient seen and examined and history reviewed. Agree with above findings and plan. 70 yo WM with history of Etoh abuse admitted with acute alcoholic  encephalopathy. He was admitted in January 2017 with similar problems. Troponin elevated then. Extensive cardiac evaluation done then including Echo- normal and cardiac cath. I personally reviewed his cath data. He had modest stenosis in a nondominant RCA 60% mid. No other disease. Medical management recommended.  Now patient history is difficult. He is soundly sleeping and disoriented on arousal. He has no history of anginal pain. Ecg is normal. Troponin is elevated to 11.82.  Given prior cardiac work up, lack of ischemic symptoms, and normal Ecg I do not feel further cardiac evaluation is warranted. Troponin elevation related to demand of HTN and Etoh intoxication. Really I don't know why troponins would be checked in this setting with lack of clinical reason to do so. Resume ASA, beta blocker, statin.  Call if you have further questions.   Nicasio Barlowe [place], [place] 05/10/2016 11:22 AM

## 2016-05-10 NOTE — Progress Notes (Signed)
05/10/2016 Patient transfer from 2 H to [place] at 1745. Patient is alert to self, not aware of place and time. Patient is on 4 point restraint and on a CIWA . He have abrasion to the knees, scab to the left knee. Scarring to the right arm, sacb to bilateral foot, arms, bruising under left eye and laceration to left eye. Patient have a condom cath and rectal pouch.  CHG was done and he was place on contact c-diff positive.  Sacrum pink but blanchesNadine Insurance claims handler

## 2016-05-10 NOTE — Progress Notes (Signed)
[place] for IV Heparin Indication: chest pain/ACS  Allergies  Allergen Reactions  . Lisinopril     syncope  . Celebrex [Celecoxib] Rash    Patient Measurements: Height: 5\' 10"  (177.8 cm) Weight: 180 lb (81.647 kg) IBW/kg (Calculated) : 73   Vital Signs: Temp: 98.3 F (36.8 C) (05/23 1158) Temp Source: Axillary (05/23 1158) BP: 154/101 mmHg (05/23 1200) Pulse Rate: 75 (05/23 1200)  Labs:  Recent Labs  05/09/16 2044 05/09/16 2305 05/10/16 0241 05/10/16 0456 05/10/16 1339  HGB 12.7*  --   --  12.1*  --   HCT 35.1*  --   --  34.5*  --   PLT 326  --   --  300  --   APTT  --   --  22*  --   --   LABPROT  --   --  13.5  --   --   INR  --   --  1.05  --   --   HEPARINUNFRC  --   --   --   --  0.12*  CREATININE 0.67  --   --  0.66  --   TROPONINI  --  11.59*  --  11.85*  --     Estimated Creatinine Clearance: 91.3 mL/min (by C-G formula based on Cr of 0.66).    Scheduled:  . aspirin EC  81 mg Oral Daily  . atorvastatin  40 mg Oral q1800  . [START ON 05/11/2016] buPROPion  100 mg Oral BID  . carvedilol  12.5 mg Oral BID WC  . citalopram  20 mg Oral QHS  . folic acid  1 mg Oral Daily  . lactulose  30 g Oral BID  . magnesium gluconate  500 mg Oral QHS  . multivitamin with minerals  1 tablet Oral Daily  . sodium chloride flush  3 mL Intravenous Q12H  . tamsulosin  0.4 mg Oral Daily  . thiamine  100 mg Oral Daily   Infusions:  . 0.9 % NaCl with KCl 20 mEq / L 50 mL/hr at 05/10/16 1300  . heparin 1,000 Units/hr (05/10/16 1300)    Assessment: 70 yo male with elevated troponin (history of CAD) on heparin. Per cardiology, no further cardiac evaluation needed.  -Spoke with Dr. [place] and ok to discontinue heparin.   Goal of Therapy:  Heparin level 0.3-0.7 units/ml Monitor platelets by anticoagulation protocol: Yes   Plan:  -Discontinue heparin infusion -Begin lovenox for VTE prophylaxis  Hildred Laser, Pharm D 05/10/2016  3:00 PM

## 2016-05-10 NOTE — Progress Notes (Signed)
Attempted to give repots to [place]. Nurse unavailable at this time.

## 2016-05-10 NOTE — Progress Notes (Signed)
[facility] PROGRESS NOTE  Joshua Henson [id number] DOB: 1945/12/20 DOA: 05/09/2016 PCP: Mayra Neer, MD  Assessment/Plan: NSTEMI -  Arrived to  [facility] 7[facility]. Heart score 5. Hx CAD s/p cath January 2017 with moderate to severestenosis RCA. Chart review indicates cards recommended statin and asa. Troponin greater than 11 x2. EKG with SR and T wave abnormality. Heparin drip intact.  -continue heparin drip  -continue asa and statin -await cardiology input  . Acute encephalopathy in a setting of recent alcohol abuse frequent falls because of polysubstance abuse and elevated ammonia. Ammonia level 45. Good results. Ct head negative, UDS negative.  Pt remains combative when awake. Currently mitted and sleeping due to ativan. No signs infection. No metabolic derangement Likely related to ETOH abuse. Recent admission for withdrawal -obtain b12 and folate -follow ammonia level   . Alcohol withdrawal ([place]) - See #2. - CIWA protocol  . CAD (coronary artery disease), native coronary artery- see #1. Home meds include coreg, asa, statin -continue home med -await cardiology consult   . Hypokalemia - repleted and potassium remains 3.4. -add to gentle IV fluids -recheck in am   Code Status: full Family Communication: none present Disposition Plan: home when ready   Consultants:  cardiology  Procedures:  none  Antibiotics:  none  HPI/Subjective: 70 y.o. male with medical history significant of CAD, alcoholism, C. difficile colitis, Meniere disease, hypertension, remote history of prostate cancer, depression, history of stroke admitted with acute encephalopathy likely secondary to alcohol abuse and/or hepatic encephalopathy with elevated troponin and possible chest versus abdominal left-sided pain worrisome for an NSTEMI Objective: Filed Vitals:   05/10/16 0757 05/10/16 0800  BP: 152/95 145/88  Pulse:  71  Temp:    Resp:  21   No intake or output data in the 24 hours  ending 05/10/16 0828 Filed Weights   05/10/16 0251 05/10/16 0251  Weight: 83.8 kg (184 lb 11.9 oz) 81.647 kg (180 lb)    Exam:   General:  Well nourished sleeping, mitts on hands  Cardiovascular: RRR no MGR no LE edema  Respiratory: somewhat shallow. BS slightly coarse throughout but no crackles or wheeze  Abdomen: non-distended soft sluggish BS  Musculoskeletal: joints without swelling/erythema. Moves all extremeties  Neuro: somewhat lethargic. Arouses to tactile stimulus. Remains combative when awake   Data Reviewed: Basic Metabolic Panel:  Recent Labs Lab 05/09/16 2044 05/10/16 0456  NA 139 138  K 3.3* 3.4*  CL 106 109  CO2 24 23  GLUCOSE 100* 108*  BUN 10 8  CREATININE 0.67 0.66  CALCIUM 9.1 8.3*  MG  --  1.4*  PHOS  --  2.5   Liver Function Tests:  Recent Labs Lab 05/09/16 2044 05/10/16 0456  AST 31 28  ALT 20 17  ALKPHOS 186* 166*  BILITOT 1.1 1.1  PROT 6.5 5.8*  ALBUMIN 3.4* 3.1*   No results for input(s): LIPASE, AMYLASE in the last 168 hours.  Recent Labs Lab 05/09/16 2305  AMMONIA 43*   CBC:  Recent Labs Lab 05/09/16 2044 05/10/16 0456  WBC 5.7 6.5  HGB 12.7* 12.1*  HCT 35.1* 34.5*  MCV 98.3 100.6*  PLT 326 300   Cardiac Enzymes:  Recent Labs Lab 05/09/16 2305 05/10/16 0456  TROPONINI 11.59* 11.85*   BNP (last 3 results) No results for input(s): BNP in the last 8760 hours.  ProBNP (last 3 results) No results for input(s): PROBNP in the last 8760 hours.  CBG:  Recent Labs Lab 05/09/16 2032  GLUCAP 104*    No results found for this or any previous visit (from the past 240 hour(s)).   Studies: Ct Abdomen Pelvis Wo Contrast  05/10/2016  CLINICAL DATA:  Abdominal pain post fall a few days prior EXAM: CT ABDOMEN AND PELVIS WITHOUT CONTRAST TECHNIQUE: Multidetector CT imaging of the abdomen and pelvis was performed following the standard protocol without IV contrast. COMPARISON:  CT 02/16/2016 FINDINGS: Lower chest:  Small left pleural effusion, fluid measures simple fluid density. Mild atelectasis in both lower lobes. Liver: Partially obscured by motion.  No evidence of focal lesion. Hepatobiliary: Gallbladder physiologically distended, partially obscured by motion. No calcified stone. Difficult to exclude wall thickening. Pancreas: Partially obscured by motion. No evidence of traumatic injury. Atrophic parenchymal. Spleen: Splenic granuloma. No perisplenic fluid. No evidence of traumatic injury allowing for lack contrast. Adrenal glands: No hemorrhage or nodule. Kidneys: Bilateral nonspecific perinephric edema, stable from prior. No hydronephrosis. No focal abnormality. Stomach/Bowel: Stomach physiologically distended. There are no dilated or thickened small bowel loops. Small-moderate volume of stool throughout the colon without colonic wall thickening. Distal colonic diverticulosis without diverticulitis. The appendix is normal. Vascular/Lymphatic: No retroperitoneal fluid, bleed, or adenopathy. Abdominal aorta is normal in caliber. Reproductive: Prominent sized prostate gland with mass effect on the bladder base. Brachytherapy seeds. Bladder: Physiologically distended, no wall thickening. Other: No free air, free fluid, or intra-abdominal fluid collection. Fat within both inguinal canals. Tiny fat containing umbilical hernia. Musculoskeletal: Left anterior rib fractures are remote. There are no acute or suspicious osseous abnormalities. Motion artifact limits detailed assessment. IMPRESSION: No acute or traumatic abnormality in the abdomen or pelvis allowing for patient motion and lack of intravenous contrast. Small left pleural effusion measures simple fluid density. Electronically Signed   By: Jeb Levering M.D.   On: 05/10/2016 03:49   Dg Chest 2 View  05/09/2016  CLINICAL DATA:  Fall 5 days prior with altered mental status. EXAM: CHEST  2 VIEW COMPARISON:  Most recent chest radiograph 03/23/2016. Additional priors  radiographs reviewed. FINDINGS: The cardiomediastinal contours are unchanged, heart at the upper limits of normal in size. There is thickening of the paratracheal stripes, stable and likely related to overlapping vascular structures. Obscuration of the left hemidiaphragm secondary to small left pleural effusion No pulmonary edema. No pneumothorax. No acute osseous abnormalities are seen. IMPRESSION: Small left pleural effusion. No pneumothorax or evidence of displaced rib fracture. Electronically Signed   By: Jeb Levering M.D.   On: 05/09/2016 21:29   Ct Head Wo Contrast  05/09/2016  CLINICAL DATA:  Initial evaluation for acute fall 5 days ago. Bruising to left thigh. EXAM: CT HEAD WITHOUT CONTRAST CT MAXILLOFACIAL WITHOUT CONTRAST TECHNIQUE: Multidetector CT imaging of the head and maxillofacial structures were performed using the standard protocol without intravenous contrast. Multiplanar CT image reconstructions of the maxillofacial structures were also generated. COMPARISON:  None. FINDINGS: CT HEAD FINDINGS Diffuse prominence of the CSF containing spaces compatible with generalized cerebral atrophy. Mild to moderate chronic small vessel ischemic disease present. Scattered vascular calcifications within the carotid siphons. No acute intracranial hemorrhage. No acute large vessel territory infarct. No mass lesion, midline shift, or mass effect para ventricular prominence related to global parenchymal volume loss present without hydrocephalus. No extra-axial fluid collection. Left periorbital contusion. Calcification at the anterior aspect of the left globe. Globes themselves grossly intact. No retro-orbital process. Scalp soft tissues otherwise normal. No mastoid effusion. Calvarium intact. CT MAXILLOFACIAL FINDINGS Left periorbital contusion/ laceration. Two adjacent radiopaque calcific densities present  anterior to the left globe measuring up to 7 mm (series 5, image 58). These are suspicious for  retained debris. Left globe itself intact. Right globe normal. No retro-orbital hematoma or other pathology. Mild left facial contusion as well. No orbital floor fracture. Small focal old defect in the left lamina papyracea is new relative to prior exam, and likely reflects a small acute fracture (series 9, image 33). Associated 2 mm of displacement. Orbital roofs and lateral orbital walls are intact. Zygomatic arches intact. No acute maxillary fracture. Pterygoid plates intact. No acute nasal bone fracture. Nasal septum intact. Mandible intact. Mandibular condyles mildly subluxed anteriorly relative to the temporomandibular fossa, likely related to jaw positioning. Poor dentition noted. No acute abnormality about the dentition itself. Chronic bilateral maxillary sinus disease again noted, stable. Minimal mucosal thickening within the ethmoidal air cells. Paranasal sinuses are otherwise clear. Visualized upper cervical spine grossly intact. IMPRESSION: CT HEAD: 1. No acute intracranial process. 2. Stable atrophy with chronic small vessel ischemic disease. CT MAXILLOFACIAL: 1. Left periorbital/facial contusion with left periorbital laceration. Scattered radiopaque densities anterior to the left globe likely reflect retained debris. Globe itself intact with no retro-orbital pathology. 2. Acute fracture of the left lamina papyracea with 2 mm of displacement. While this fracture somewhat age indeterminate in appearance, this is new relative to most recent CT from 03/22/2016, and is suspected to have occurred with this most recent trauma 3. Chronic maxillary sinusitis. Electronically Signed   By: Jeannine Boga M.D.   On: 05/09/2016 22:00   Ct Maxillofacial Wo Cm  05/09/2016  CLINICAL DATA:  Initial evaluation for acute fall 5 days ago. Bruising to left thigh. EXAM: CT HEAD WITHOUT CONTRAST CT MAXILLOFACIAL WITHOUT CONTRAST TECHNIQUE: Multidetector CT imaging of the head and maxillofacial structures were  performed using the standard protocol without intravenous contrast. Multiplanar CT image reconstructions of the maxillofacial structures were also generated. COMPARISON:  None. FINDINGS: CT HEAD FINDINGS Diffuse prominence of the CSF containing spaces compatible with generalized cerebral atrophy. Mild to moderate chronic small vessel ischemic disease present. Scattered vascular calcifications within the carotid siphons. No acute intracranial hemorrhage. No acute large vessel territory infarct. No mass lesion, midline shift, or mass effect para ventricular prominence related to global parenchymal volume loss present without hydrocephalus. No extra-axial fluid collection. Left periorbital contusion. Calcification at the anterior aspect of the left globe. Globes themselves grossly intact. No retro-orbital process. Scalp soft tissues otherwise normal. No mastoid effusion. Calvarium intact. CT MAXILLOFACIAL FINDINGS Left periorbital contusion/ laceration. Two adjacent radiopaque calcific densities present anterior to the left globe measuring up to 7 mm (series 5, image 58). These are suspicious for retained debris. Left globe itself intact. Right globe normal. No retro-orbital hematoma or other pathology. Mild left facial contusion as well. No orbital floor fracture. Small focal old defect in the left lamina papyracea is new relative to prior exam, and likely reflects a small acute fracture (series 9, image 33). Associated 2 mm of displacement. Orbital roofs and lateral orbital walls are intact. Zygomatic arches intact. No acute maxillary fracture. Pterygoid plates intact. No acute nasal bone fracture. Nasal septum intact. Mandible intact. Mandibular condyles mildly subluxed anteriorly relative to the temporomandibular fossa, likely related to jaw positioning. Poor dentition noted. No acute abnormality about the dentition itself. Chronic bilateral maxillary sinus disease again noted, stable. Minimal mucosal thickening  within the ethmoidal air cells. Paranasal sinuses are otherwise clear. Visualized upper cervical spine grossly intact. IMPRESSION: CT HEAD: 1. No acute intracranial process. 2.  Stable atrophy with chronic small vessel ischemic disease. CT MAXILLOFACIAL: 1. Left periorbital/facial contusion with left periorbital laceration. Scattered radiopaque densities anterior to the left globe likely reflect retained debris. Globe itself intact with no retro-orbital pathology. 2. Acute fracture of the left lamina papyracea with 2 mm of displacement. While this fracture somewhat age indeterminate in appearance, this is new relative to most recent CT from 03/22/2016, and is suspected to have occurred with this most recent trauma 3. Chronic maxillary sinusitis. Electronically Signed   By: Jeannine Boga M.D.   On: 05/09/2016 22:00    Scheduled Meds: . ALPRAZolam  0.5-0.75 mg Oral QHS  . aspirin EC  81 mg Oral Daily  . atorvastatin  40 mg Oral q1800  . buPROPion  200 mg Oral Daily  . carvedilol  6.25 mg Oral BID WC  . citalopram  40 mg Oral QHS  . folic acid  1 mg Oral Daily  . lactulose  30 g Oral BID  . magnesium gluconate  500 mg Oral QHS  . sodium chloride flush  3 mL Intravenous Q12H  . tamsulosin  0.4 mg Oral Daily  . thiamine  100 mg Oral Daily   Continuous Infusions: . sodium chloride 75 mL/hr at 05/10/16 0100  . heparin 1,000 Units/hr (05/10/16 0759)    Active Problems:   Alcohol withdrawal ([facility])   CAD (coronary artery disease), native coronary artery   Frequent falls   Acute encephalopathy   NSTEMI (non-ST elevated myocardial infarction) ([facility])   Hypokalemia   Encephalopathy   Left upper quadrant pain    Time spent: 50 minutes    [facility]  If 7PM-7AM, please contact night-coverage at [email or web address], password [facility] 05/10/2016, 8:28 AM  LOS: 0 days

## 2016-05-10 NOTE — ED Notes (Signed)
Report given to [facility] and [facility]. Currently awaiting [facility] for transport of pt to [facility].

## 2016-05-10 NOTE — Progress Notes (Signed)
ANTICOAGULATION CONSULT NOTE - Initial Consult  Pharmacy Consult for IV Heparin Indication: chest pain/ACS  Allergies  Allergen Reactions  . Lisinopril     syncope  . Celebrex [Celecoxib] Rash    Patient Measurements: Height: 5\' 10"  (177.8 cm) Weight: 180 lb (81.647 kg) IBW/kg (Calculated) : 73 Heparin Dosing Weight: 83 kg  Vital Signs: Temp: 97.9 F (36.6 C) (05/22 2015) Temp Source: Oral (05/22 2015) BP: 151/101 mmHg (05/23 0400) Pulse Rate: 78 (05/23 0400)  Labs:  Recent Labs  05/09/16 2044 05/09/16 2305 05/10/16 0241  HGB 12.7*  --   --   HCT 35.1*  --   --   PLT 326  --   --   APTT  --   --  22*  LABPROT  --   --  13.5  INR  --   --  1.05  CREATININE 0.67  --   --   TROPONINI  --  11.59*  --     Estimated Creatinine Clearance: 91.3 mL/min (by C-G formula based on Cr of 0.67).   Medical History: Past Medical History  Diagnosis Date  . Prostate cancer ([place]) 2010    External beam radiation (urol - Risa Grill, XRT Valere Dross)  . Stroke ([place])   . Depression   . Tachycardia   . Hypertension   . Insomnia   . CAD (coronary artery disease), native coronary artery 01/10/2016    3 vessel coronary calcification noted on CT scan 04/03/15    . Fatty liver, alcoholic [id number]    Medications:   (Not in a hospital admission) Scheduled:  . ALPRAZolam  0.5-0.75 mg Oral QHS  . aspirin  81 mg Oral Daily  . atorvastatin  40 mg Oral q1800  . buPROPion  200 mg Oral Daily  . carvedilol  6.25 mg Oral BID WC  . citalopram  20 mg Oral Daily  . citalopram  40 mg Oral QHS  . folic acid  1 mg Oral Daily  . lactulose  30 g Oral BID  . Magnesium  500 mg Oral QHS  . sodium chloride flush  3 mL Intravenous Q12H  . tamsulosin  0.4 mg Oral Daily  . thiamine  100 mg Oral Daily   Infusions:  . sodium chloride 75 mL/hr at 05/10/16 0100  . heparin 1,000 Units/hr (05/10/16 0348)  . potassium chloride 10 mEq (05/10/16 0417)    Assessment: 69yoM presents with confusion.   Troponin=11.59 IV Heparin per Rx for ACS. Goal of Therapy:  Heparin level 0.3-0.7 units/ml Monitor platelets by anticoagulation protocol: Yes   Plan:   Baseline coags stat  Heparin 4000 unit bolus x1  Start drip @ 1000 units/hr  Daily HL/CBC  Check 1st HL in 6 hours (10 am)  Dorrene German 05/10/2016,4:34 AM

## 2016-05-10 NOTE — Care Management Note (Signed)
Case Management Note  Patient Details  Name: Joshua Henson MRN: [id number] Date of Birth: Jul 05, 1946  Subjective/Objective:         Adm w nstemi           Action/Plan: lives w wife   Expected Discharge Date:                  Expected Discharge Plan:  Home/Self Care  In-House Referral:  Clinical Social Work  Discharge planning Services     Post Acute Care Choice:    Choice offered to:     DME Arranged:    DME Agency:     HH Arranged:    [place] Agency:     Status of Service:     Medicare Important Message Given:    Date Medicare IM Given:    Medicare IM give by:    Date Additional Medicare IM Given:    Additional Medicare Important Message give by:     If discussed at [place] of Stay Meetings, dates discussed:    Additional Comments: current etoh use, will make sw ref, moniter for dc needs  Lacretia Leigh, RN 05/10/2016, 8:38 AM

## 2016-05-11 DIAGNOSIS — E876 Hypokalemia: Secondary | ICD-10-CM

## 2016-05-11 DIAGNOSIS — F10239 Alcohol dependence with withdrawal, unspecified: Secondary | ICD-10-CM

## 2016-05-11 LAB — CBC
HCT: 36.7 % — ABNORMAL LOW (ref 39.0–52.0)
Hemoglobin: 12.2 g/dL — ABNORMAL LOW (ref 13.0–17.0)
MCH: 33.8 pg (ref 26.0–34.0)
MCHC: 33.2 g/dL (ref 30.0–36.0)
MCV: 101.7 fL — ABNORMAL HIGH (ref 78.0–100.0)
PLATELETS: 276 10*3/uL (ref 150–400)
RBC: 3.61 MIL/uL — ABNORMAL LOW (ref 4.22–5.81)
RDW: 15.4 % (ref 11.5–15.5)
WBC: 8.5 10*3/uL (ref 4.0–10.5)

## 2016-05-11 LAB — BASIC METABOLIC PANEL
ANION GAP: 10 (ref 5–15)
CALCIUM: 9.1 mg/dL (ref 8.9–10.3)
CO2: 23 mmol/L (ref 22–32)
CREATININE: 0.66 mg/dL (ref 0.61–1.24)
Chloride: 107 mmol/L (ref 101–111)
GFR calc Af Amer: >60 mL/min (ref >60)
GLUCOSE: 110 mg/dL — AB (ref 65–99)
Potassium: 3.5 mmol/L (ref 3.5–5.1)
Sodium: 140 mmol/L (ref 135–145)

## 2016-05-11 LAB — GLUCOSE, CAPILLARY: GLUCOSE-CAPILLARY: 139 mg/dL — AB (ref 65–99)

## 2016-05-11 LAB — CLOSTRIDIUM DIFFICILE BY PCR: CDIFFPCR: POSITIVE — AB

## 2016-05-11 LAB — FOLATE RBC
FOLATE, RBC: 1480 ng/mL (ref >498)
Folate, Hemolysate: 534.3 ng/mL
HEMATOCRIT: 36.1 % — AB (ref 37.5–51.0)

## 2016-05-11 LAB — HEMATOLOGY COMMENTS:

## 2016-05-11 MED ORDER — MAGNESIUM OXIDE 400 (241.3 MG) MG PO TABS
400.0000 mg | ORAL_TABLET | Freq: Every day | ORAL | Status: DC
  Administered 2016-05-11 – 2016-05-12 (×2): 400 mg via ORAL
  Filled 2016-05-11 (×2): qty 1

## 2016-05-11 NOTE — Progress Notes (Signed)
PROGRESS NOTE                                                                                                                                                                                                             Patient Demographics:    Joshua Henson, is a 70 y.o. male, DOB - 02/19/1946, [id number]  Admit date - 05/09/2016   Admitting Physician Toy Baker, MD  Outpatient Primary MD for the patient is Mayra Neer, MD  LOS - 1   Chief Complaint  Patient presents with  . Altered Mental Status  . Fall       Brief Narrative   70 y.o. male with a Past Medical History of prostate cancer, CVA, depression, HTN, insomnia, CAD, alcoholic cirrhosis, recurrent acute encephalopathy, who presents with acute mental status changes and NST EMI. Suspect pt w/ some amount of Wernicke encephalopathy given his ETOH abuse   Subjective:    [name]     Assessment  & Plan :    Active Problems:   Alcohol withdrawal (HCC) - continue CIWA protocol    CAD (coronary artery disease), native coronary artery - continue current medical management as outlined by Cardiology  Elevated troponin - No further cardiac work up per cardiology    Frequent falls - PT evaluation    Acute encephalopathy - most likely due to alcohol history, question what is patient's baseline. He is at risk for Wernicke encephalopathy    Hypokalemia - WNL on last check  Code Status : full  Family Communication  : none at bedside  Disposition Plan  : pending improvement in mentation  Barriers For Discharge : mental status  Consults  :  Cardiology  Procedures  : none  DVT Prophylaxis  :  Lovenox  Lab Results  Component Value Date   PLT 276 05/11/2016    Antibiotics  :  None  Anti-infectives    None        Objective:   Filed Vitals:   05/10/16 2018 05/10/16 2347 05/11/16 0400 05/11/16 0800  BP: 142/81 143/90 132/81  137/91  Pulse: 67 73 76 73  Temp: 97.6 F (36.4 C) 97.6 F (36.4 C) 98.7 F (37.1 C) 98 F (36.7 C)  TempSrc: Oral Oral Oral Oral  Resp: 28 23    Height:      Weight:  SpO2: 99% 96% 97% 100%    Wt Readings from Last 3 Encounters:  05/10/16 81.647 kg (180 lb)  04/15/16 83.825 kg (184 lb 12.8 oz)  04/05/16 88.451 kg (195 lb)     Intake/Output Summary (Last 24 hours) at 05/11/16 1216 Last data filed at 05/11/16 1044  Gross per 24 hour  Intake   1253 ml  Output   2750 ml  Net  -1497 ml     Physical Exam  Awake Alert, oriented to person, bruising at left face Supple Neck,No JVD, No cervical lymphadenopathy appreciated.  Symmetrical Chest wall movement, Good air movement bilaterally, CTAB RRR,No Gallops,Rubs or new Murmurs, No Parasternal Heave +ve B.Sounds, Abd Soft, No tenderness, No organomegaly appriciated, No rebound - guarding or rigidity. No Cyanosis,+ bruise  face    Data Review:    CBC  Recent Labs Lab 05/09/16 2044 05/10/16 0456 05/11/16 0345  WBC 5.7 6.5 8.5  HGB 12.7* 12.1* 12.2*  HCT 35.1* 34.5* 36.7*  PLT 326 300 276  MCV 98.3 100.6* 101.7*  MCH 35.6* 35.3* 33.8  MCHC 36.2* 35.1 33.2  RDW 15.0 15.4 15.4    Chemistries   Recent Labs Lab 05/09/16 2044 05/10/16 0456 05/11/16 0345  NA 139 138 140  K 3.3* 3.4* 3.5  CL 106 109 107  CO2 24 23 23   GLUCOSE 100* 108* 110*  BUN 10 8 <5*  CREATININE 0.67 0.66 0.66  CALCIUM 9.1 8.3* 9.1  MG  --  1.4*  --   AST 31 28  --   ALT 20 17  --   ALKPHOS 186* 166*  --   BILITOT 1.1 1.1  --    ------------------------------------------------------------------------------------------------------------------ No results for input(s): CHOL, HDL, LDLCALC, TRIG, CHOLHDL, LDLDIRECT in the last 72 hours.  Lab Results  Component Value Date   HGBA1C 5.7* 09/03/2014   ------------------------------------------------------------------------------------------------------------------  Recent Labs   05/10/16 0456  TSH 2.644   ------------------------------------------------------------------------------------------------------------------  Recent Labs  05/10/16 0853  VITAMINB12 447    Coagulation profile  Recent Labs Lab 05/10/16 0241  INR 1.05    No results for input(s): DDIMER in the last 72 hours.  Cardiac Enzymes  Recent Labs Lab 05/09/16 2305 05/10/16 0456  TROPONINI 11.59* 11.85*   ------------------------------------------------------------------------------------------------------------------ No results found for: BNP  Inpatient Medications  Scheduled Meds: . aspirin EC  81 mg Oral Daily  . atorvastatin  40 mg Oral q1800  . buPROPion  100 mg Oral BID  . carvedilol  12.5 mg Oral BID WC  . citalopram  20 mg Oral QHS  . enoxaparin (LOVENOX) injection  40 mg Subcutaneous Q24H  . folic acid  1 mg Oral Daily  . lactulose  30 g Oral BID  . magnesium gluconate  500 mg Oral QHS  . multivitamin with minerals  1 tablet Oral Daily  . sodium chloride flush  3 mL Intravenous Q12H  . tamsulosin  0.4 mg Oral Daily  . thiamine  100 mg Oral Daily   Continuous Infusions: . 0.9 % NaCl with KCl 20 mEq / L 50 mL/hr at 05/11/16 0621   PRN Meds:.acetaminophen **OR** acetaminophen, cloNIDine, HYDROcodone-acetaminophen, LORazepam **OR** LORazepam, morphine injection, nitroGLYCERIN, ondansetron **OR** ondansetron (ZOFRAN) IV  Micro Results Recent Results (from the past 240 hour(s))  MRSA PCR Screening     Status: None   Collection Time: 05/10/16  8:30 AM  Result Value Ref Range Status   MRSA by PCR NEGATIVE NEGATIVE Final    Comment:  The GeneXpert MRSA Assay (FDA approved for NASAL specimens only), is one component of a comprehensive MRSA colonization surveillance program. It is not intended to diagnose MRSA infection nor to guide or monitor treatment for MRSA infections.   C difficile quick scan w PCR reflex     Status: Abnormal   Collection Time:  05/10/16  8:30 AM  Result Value Ref Range Status   C Diff antigen POSITIVE (A) NEGATIVE Final   C Diff toxin NEGATIVE NEGATIVE Final   C Diff interpretation   Final    C. difficile present, but toxin not detected. This indicates colonization. In most cases, this does not require treatment. If patient has signs and symptoms consistent with colitis, consider treatment. Requires ENTERIC precautions.    Radiology Reports Ct Abdomen Pelvis Wo Contrast  05/10/2016  CLINICAL DATA:  Abdominal pain post fall a few days prior EXAM: CT ABDOMEN AND PELVIS WITHOUT CONTRAST TECHNIQUE: Multidetector CT imaging of the abdomen and pelvis was performed following the standard protocol without IV contrast. COMPARISON:  CT 02/16/2016 FINDINGS: Lower chest: Small left pleural effusion, fluid measures simple fluid density. Mild atelectasis in both lower lobes. Liver: Partially obscured by motion.  No evidence of focal lesion. Hepatobiliary: Gallbladder physiologically distended, partially obscured by motion. No calcified stone. Difficult to exclude wall thickening. Pancreas: Partially obscured by motion. No evidence of traumatic injury. Atrophic parenchymal. Spleen: Splenic granuloma. No perisplenic fluid. No evidence of traumatic injury allowing for lack contrast. Adrenal glands: No hemorrhage or nodule. Kidneys: Bilateral nonspecific perinephric edema, stable from prior. No hydronephrosis. No focal abnormality. Stomach/Bowel: Stomach physiologically distended. There are no dilated or thickened small bowel loops. Small-moderate volume of stool throughout the colon without colonic wall thickening. Distal colonic diverticulosis without diverticulitis. The appendix is normal. Vascular/Lymphatic: No retroperitoneal fluid, bleed, or adenopathy. Abdominal aorta is normal in caliber. Reproductive: Prominent sized prostate gland with mass effect on the bladder base. Brachytherapy seeds. Bladder: Physiologically distended, no wall  thickening. Other: No free air, free fluid, or intra-abdominal fluid collection. Fat within both inguinal canals. Tiny fat containing umbilical hernia. Musculoskeletal: Left anterior rib fractures are remote. There are no acute or suspicious osseous abnormalities. Motion artifact limits detailed assessment. IMPRESSION: No acute or traumatic abnormality in the abdomen or pelvis allowing for patient motion and lack of intravenous contrast. Small left pleural effusion measures simple fluid density. Electronically Signed   By: Jeb Levering M.D.   On: 05/10/2016 03:49   Dg Chest 2 View  05/09/2016  CLINICAL DATA:  Fall 5 days prior with altered mental status. EXAM: CHEST  2 VIEW COMPARISON:  Most recent chest radiograph 03/23/2016. Additional priors radiographs reviewed. FINDINGS: The cardiomediastinal contours are unchanged, heart at the upper limits of normal in size. There is thickening of the paratracheal stripes, stable and likely related to overlapping vascular structures. Obscuration of the left hemidiaphragm secondary to small left pleural effusion No pulmonary edema. No pneumothorax. No acute osseous abnormalities are seen. IMPRESSION: Small left pleural effusion. No pneumothorax or evidence of displaced rib fracture. Electronically Signed   By: Jeb Levering M.D.   On: 05/09/2016 21:29   Ct Head Wo Contrast  05/09/2016  CLINICAL DATA:  Initial evaluation for acute fall 5 days ago. Bruising to left thigh. EXAM: CT HEAD WITHOUT CONTRAST CT MAXILLOFACIAL WITHOUT CONTRAST TECHNIQUE: Multidetector CT imaging of the head and maxillofacial structures were performed using the standard protocol without intravenous contrast. Multiplanar CT image reconstructions of the maxillofacial structures were also generated. COMPARISON:  None. FINDINGS: CT HEAD FINDINGS Diffuse prominence of the CSF containing spaces compatible with generalized cerebral atrophy. Mild to moderate chronic small vessel ischemic disease  present. Scattered vascular calcifications within the carotid siphons. No acute intracranial hemorrhage. No acute large vessel territory infarct. No mass lesion, midline shift, or mass effect para ventricular prominence related to global parenchymal volume loss present without hydrocephalus. No extra-axial fluid collection. Left periorbital contusion. Calcification at the anterior aspect of the left globe. Globes themselves grossly intact. No retro-orbital process. Scalp soft tissues otherwise normal. No mastoid effusion. Calvarium intact. CT MAXILLOFACIAL FINDINGS Left periorbital contusion/ laceration. Two adjacent radiopaque calcific densities present anterior to the left globe measuring up to 7 mm (series 5, image 58). These are suspicious for retained debris. Left globe itself intact. Right globe normal. No retro-orbital hematoma or other pathology. Mild left facial contusion as well. No orbital floor fracture. Small focal old defect in the left lamina papyracea is new relative to prior exam, and likely reflects a small acute fracture (series 9, image 33). Associated 2 mm of displacement. Orbital roofs and lateral orbital walls are intact. Zygomatic arches intact. No acute maxillary fracture. Pterygoid plates intact. No acute nasal bone fracture. Nasal septum intact. Mandible intact. Mandibular condyles mildly subluxed anteriorly relative to the temporomandibular fossa, likely related to jaw positioning. Poor dentition noted. No acute abnormality about the dentition itself. Chronic bilateral maxillary sinus disease again noted, stable. Minimal mucosal thickening within the ethmoidal air cells. Paranasal sinuses are otherwise clear. Visualized upper cervical spine grossly intact. IMPRESSION: CT HEAD: 1. No acute intracranial process. 2. Stable atrophy with chronic small vessel ischemic disease. CT MAXILLOFACIAL: 1. Left periorbital/facial contusion with left periorbital laceration. Scattered radiopaque densities  anterior to the left globe likely reflect retained debris. Globe itself intact with no retro-orbital pathology. 2. Acute fracture of the left lamina papyracea with 2 mm of displacement. While this fracture somewhat age indeterminate in appearance, this is new relative to most recent CT from 03/22/2016, and is suspected to have occurred with this most recent trauma 3. Chronic maxillary sinusitis. Electronically Signed   By: Jeannine Boga M.D.   On: 05/09/2016 22:00   Ct Maxillofacial Wo Cm  05/09/2016  CLINICAL DATA:  Initial evaluation for acute fall 5 days ago. Bruising to left thigh. EXAM: CT HEAD WITHOUT CONTRAST CT MAXILLOFACIAL WITHOUT CONTRAST TECHNIQUE: Multidetector CT imaging of the head and maxillofacial structures were performed using the standard protocol without intravenous contrast. Multiplanar CT image reconstructions of the maxillofacial structures were also generated. COMPARISON:  None. FINDINGS: CT HEAD FINDINGS Diffuse prominence of the CSF containing spaces compatible with generalized cerebral atrophy. Mild to moderate chronic small vessel ischemic disease present. Scattered vascular calcifications within the carotid siphons. No acute intracranial hemorrhage. No acute large vessel territory infarct. No mass lesion, midline shift, or mass effect para ventricular prominence related to global parenchymal volume loss present without hydrocephalus. No extra-axial fluid collection. Left periorbital contusion. Calcification at the anterior aspect of the left globe. Globes themselves grossly intact. No retro-orbital process. Scalp soft tissues otherwise normal. No mastoid effusion. Calvarium intact. CT MAXILLOFACIAL FINDINGS Left periorbital contusion/ laceration. Two adjacent radiopaque calcific densities present anterior to the left globe measuring up to 7 mm (series 5, image 58). These are suspicious for retained debris. Left globe itself intact. Right globe normal. No retro-orbital hematoma  or other pathology. Mild left facial contusion as well. No orbital floor fracture. Small focal old defect in the left lamina papyracea is new  relative to prior exam, and likely reflects a small acute fracture (series 9, image 33). Associated 2 mm of displacement. Orbital roofs and lateral orbital walls are intact. Zygomatic arches intact. No acute maxillary fracture. Pterygoid plates intact. No acute nasal bone fracture. Nasal septum intact. Mandible intact. Mandibular condyles mildly subluxed anteriorly relative to the temporomandibular fossa, likely related to jaw positioning. Poor dentition noted. No acute abnormality about the dentition itself. Chronic bilateral maxillary sinus disease again noted, stable. Minimal mucosal thickening within the ethmoidal air cells. Paranasal sinuses are otherwise clear. Visualized upper cervical spine grossly intact. IMPRESSION: CT HEAD: 1. No acute intracranial process. 2. Stable atrophy with chronic small vessel ischemic disease. CT MAXILLOFACIAL: 1. Left periorbital/facial contusion with left periorbital laceration. Scattered radiopaque densities anterior to the left globe likely reflect retained debris. Globe itself intact with no retro-orbital pathology. 2. Acute fracture of the left lamina papyracea with 2 mm of displacement. While this fracture somewhat age indeterminate in appearance, this is new relative to most recent CT from 03/22/2016, and is suspected to have occurred with this most recent trauma 3. Chronic maxillary sinusitis. Electronically Signed   By: Jeannine Boga M.D.   On: 05/09/2016 22:00    Time Spent in minutes  25   Velvet Bathe M.D on 05/11/2016 at 12:16 PM  Between 7am to 7pm - Pager - [phone]  After 7pm go to [email or web address] - password [email or web address]  [facility] -  Office  [phone]

## 2016-05-12 DIAGNOSIS — R296 Repeated falls: Secondary | ICD-10-CM

## 2016-05-12 NOTE — Evaluation (Signed)
Physical Therapy Evaluation Patient Details Name: Joshua Henson MRN: [id number] DOB: 1946-06-16 Today's Date: 05/12/2016   History of Present Illness  Pt adm with AMS. Pt with ETOH withdrawal and NSTEMI. PMH - prostate cancer, CVA, depression, HTN, insomnia, CAD, alcoholic cirrhosis, recurrent acute encephalopathy  Clinical Impression  Pt admitted with above diagnosis and presents to PT with functional limitations due to deficits listed below (See PT problem list). Pt needs skilled PT to maximize independence and safety to allow discharge to ST-SNF. Pt not safe to mobilize on his own and he doesn't have 24 hour assist at home. Pt with decr safety awareness and is reluctant to return to SNF but didn't refuse that option. Wife reports he will be alone during the day at home and that he needs to be independent prior to return.     Follow Up Recommendations SNF    Equipment Recommendations  None recommended by PT    Recommendations for Other Services       Precautions / Restrictions Precautions Precautions: Fall Restrictions Weight Bearing Restrictions: No      Mobility  Bed Mobility Overal bed mobility: Needs Assistance Bed Mobility: Supine to Sit     Supine to sit: Supervision;HOB elevated     General bed mobility comments: Incr time and effort and use of rail to come to sitting  Transfers Overall transfer level: Needs assistance Equipment used: Rolling walker (2 wheeled) Transfers: Sit to/from Stand Sit to Stand: Min assist         General transfer comment: Assist to bring hips up and for balance. Pt leaning posteriorly with  backs of legs braced on bed.  Ambulation/Gait Ambulation/Gait assistance: Mod assist;Min assist Ambulation Distance (Feet): 100 Feet Assistive device: Rolling walker (2 wheeled) Gait Pattern/deviations: Step-through pattern;Decreased stride length;Ataxic;Staggering right;Leaning posteriorly;Staggering left Gait velocity: decr Gait velocity  interpretation: Below normal speed for age/gender General Gait Details: assist for balance and support. Pt with intermittent staggering requiring mod A to correct. With turning pt with posterior staggering  Stairs            Wheelchair Mobility    Modified Rankin (Stroke Patients Only)       Balance Overall balance assessment: Needs assistance Sitting-balance support: No upper extremity supported;Feet supported Sitting balance-Leahy Scale: Normal     Standing balance support: Bilateral upper extremity supported Standing balance-Leahy Scale: Poor Standing balance comment: walker and min A for static standing                             Pertinent Vitals/Pain Pain Assessment: No/denies pain    Home Living Family/patient expects to be discharged to:: Skilled nursing facility Living Arrangements: Spouse/significant other Available Help at Discharge: Family;Available PRN/intermittently Type of Home: Other(Comment) (condo) Home Access: Stairs to enter Entrance Stairs-Rails: Right;Left Entrance Stairs-Number of Steps: 3 Home Layout: Two level;Bed/bath upstairs Home Equipment: Walker - 2 wheels;Shower seat - built in;Bedside commode;Grab bars - tub/shower      Prior Function Level of Independence: Needs assistance   Gait / Transfers Assistance Needed: Pt with frequent falls since last hospitalization and SNF stay.           Hand Dominance   Dominant Hand: Right    Extremity/Trunk Assessment   Upper Extremity Assessment: Overall WFL for tasks assessed           Lower Extremity Assessment: Generalized weakness         Communication   Communication: [facility]  Cognition Arousal/Alertness: Awake/alert Behavior During Therapy: WFL for tasks assessed/performed Overall Cognitive Status: Impaired/Different from baseline Area of Impairment: Safety/judgement;Memory     Memory: Decreased short-term memory;Decreased recall of precautions    Safety/Judgement: Decreased awareness of safety;Decreased awareness of deficits          General Comments      Exercises        Assessment/Plan    PT Assessment Patient needs continued PT services  PT Diagnosis Difficulty walking;Abnormality of gait;Generalized weakness   PT Problem List Decreased strength;Decreased activity tolerance;Decreased balance;Decreased mobility;Decreased cognition;Decreased knowledge of use of DME;Decreased safety awareness;Decreased knowledge of precautions  PT Treatment Interventions DME instruction;Gait training;Functional mobility training;Therapeutic exercise;Therapeutic activities;Balance training;Cognitive remediation;Patient/family education   PT Goals (Current goals can be found in the Care Plan section) Acute Rehab PT Goals Patient Stated Goal: Go home PT Goal Formulation: With patient Time For Goal Achievement: 05/26/16 Potential to Achieve Goals: Good    Frequency Min 3X/week   Barriers to discharge Decreased caregiver support Pt does not have 24 hour assist at home    Co-evaluation               End of Session Equipment Utilized During Treatment: Gait belt Activity Tolerance: Patient tolerated treatment well Patient left: in chair;with call bell/phone within reach;with chair alarm set Nurse Communication: Mobility status         Time: [id number] PT Time Calculation (min) (ACUTE ONLY): 21 min   Charges:   PT Evaluation $PT Eval Moderate Complexity: 1 Procedure     PT G Codes:        Emmilia Sowder 05-23-2016, 3:48 PM [facility] PT [phone]

## 2016-05-12 NOTE — Progress Notes (Signed)
Patient lying in bed, wife present at bedside. Call light within reach.

## 2016-05-12 NOTE — Progress Notes (Signed)
PROGRESS NOTE                                                                                                                                                                                                             Patient Demographics:    Joshua Henson, is a 70 y.o. male, DOB - December 24, 1945, [id number]  Admit date - 05/09/2016   Admitting Physician Toy Baker, MD  Outpatient Primary MD for the patient is Mayra Neer, MD  LOS - 2   Chief Complaint  Patient presents with  . Altered Mental Status  . Fall       Brief Narrative   70 y.o. male with a Past Medical History of prostate cancer, CVA, depression, HTN, insomnia, CAD, alcoholic cirrhosis, recurrent acute encephalopathy, who presents with acute mental status changes and NSTEMI.     Subjective:    Joshua Henson  Has no new complaints today. He is more conversant today.    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 Will monitor the rest of the day to see if patient continues to be oriented as wife states that he has periods of time where he is alert and then gets confused.    CAD (coronary artery disease), native coronary artery - continue current medical management as outlined by Cardiology  Elevated troponin - No further cardiac work up per cardiology    Frequent falls - PT evaluation. Pt was at [facility] in the past. I suspect he has some debility due to his recent hospitalization with decreased activity due to principle problem above.    Acute encephalopathy - resolving.    Hypokalemia - WNL on last check  Code Status : full  Family Communication  : spoke with wife   Disposition Plan  : pending improvement in mentation  Barriers For Discharge : mental status  Consults  :  Cardiology  Procedures  : none  DVT Prophylaxis  :  Lovenox  Lab Results    Component Value Date   PLT 276 05/11/2016    Antibiotics  :  None  Anti-infectives    None        Objective:   Filed Vitals:   05/11/16 1200 05/11/16 1600 05/11/16 2000 05/12/16 0804  BP: 125/90 150/85 140/92  148/83  Pulse:  69 72 71  Temp: 98 F (36.7 C) 98.3 F (36.8 C)    TempSrc: Oral Oral    Resp: 16 22    Height:      Weight:      SpO2: 100%   99%    Wt Readings from Last 3 Encounters:  05/10/16 81.647 kg (180 lb)  04/15/16 83.825 kg (184 lb 12.8 oz)  04/05/16 88.451 kg (195 lb)     Intake/Output Summary (Last 24 hours) at 05/12/16 1008 Last data filed at 05/11/16 1800  Gross per 24 hour  Intake    623 ml  Output      0 ml  Net    623 ml     Physical Exam  Awake Alert, oriented to person, bruising at left face Supple Neck,No JVD, No cervical lymphadenopathy appreciated.  Symmetrical Chest wall movement, Good air movement bilaterally, CTAB RRR,No Gallops,Rubs or new Murmurs, No Parasternal Heave +ve B.Sounds, Abd Soft, No tenderness, No organomegaly appriciated, No rebound - guarding or rigidity. No Cyanosis,+ bruise  face    Data Review:    CBC  Recent Labs Lab 05/09/16 2044 05/10/16 0456 05/10/16 0853 05/11/16 0345  WBC 5.7 6.5  --  8.5  HGB 12.7* 12.1*  --  12.2*  HCT 35.1* 34.5* 36.1* 36.7*  PLT 326 300  --  276  MCV 98.3 100.6*  --  101.7*  MCH 35.6* 35.3*  --  33.8  MCHC 36.2* 35.1  --  33.2  RDW 15.0 15.4  --  15.4    Chemistries   Recent Labs Lab 05/09/16 2044 05/10/16 0456 05/11/16 0345  NA 139 138 140  K 3.3* 3.4* 3.5  CL 106 109 107  CO2 24 23 23   GLUCOSE 100* 108* 110*  BUN 10 8 <5*  CREATININE 0.67 0.66 0.66  CALCIUM 9.1 8.3* 9.1  MG  --  1.4*  --   AST 31 28  --   ALT 20 17  --   ALKPHOS 186* 166*  --   BILITOT 1.1 1.1  --    ------------------------------------------------------------------------------------------------------------------ No results for input(s): CHOL, HDL, LDLCALC, TRIG, CHOLHDL,  LDLDIRECT in the last 72 hours.  Lab Results  Component Value Date   HGBA1C 5.7* 09/03/2014   ------------------------------------------------------------------------------------------------------------------  Recent Labs  05/10/16 0456  TSH 2.644   ------------------------------------------------------------------------------------------------------------------  Recent Labs  05/10/16 0853  VITAMINB12 447    Coagulation profile  Recent Labs Lab 05/10/16 0241  INR 1.05    No results for input(s): DDIMER in the last 72 hours.  Cardiac Enzymes  Recent Labs Lab 05/09/16 2305 05/10/16 0456  TROPONINI 11.59* 11.85*   ------------------------------------------------------------------------------------------------------------------ No results found for: BNP  Inpatient Medications  Scheduled Meds: . aspirin EC  81 mg Oral Daily  . atorvastatin  40 mg Oral q1800  . buPROPion  100 mg Oral BID  . carvedilol  12.5 mg Oral BID WC  . citalopram  20 mg Oral QHS  . enoxaparin (LOVENOX) injection  40 mg Subcutaneous Q24H  . folic acid  1 mg Oral Daily  . lactulose  30 g Oral BID  . magnesium oxide  400 mg Oral QHS  . multivitamin with minerals  1 tablet Oral Daily  . sodium chloride flush  3 mL Intravenous Q12H  . tamsulosin  0.4 mg Oral Daily  . thiamine  100 mg Oral Daily   Continuous Infusions: . 0.9 % NaCl with KCl 20 mEq / L 50 mL/hr  at 05/11/16 0621   PRN Meds:.acetaminophen **OR** acetaminophen, cloNIDine, HYDROcodone-acetaminophen, LORazepam **OR** LORazepam, morphine injection, nitroGLYCERIN, ondansetron **OR** ondansetron (ZOFRAN) IV  Micro Results Recent Results (from the past 240 hour(s))  MRSA PCR Screening     Status: None   Collection Time: 05/10/16  8:30 AM  Result Value Ref Range Status   MRSA by PCR NEGATIVE NEGATIVE Final    Comment:        The GeneXpert MRSA Assay (FDA approved for NASAL specimens only), is one component of a comprehensive  MRSA colonization surveillance program. It is not intended to diagnose MRSA infection nor to guide or monitor treatment for MRSA infections.   C difficile quick scan w PCR reflex     Status: Abnormal   Collection Time: 05/10/16  8:30 AM  Result Value Ref Range Status   C Diff antigen POSITIVE (A) NEGATIVE Final   C Diff toxin NEGATIVE NEGATIVE Final   C Diff interpretation   Final    C. difficile present, but toxin not detected. This indicates colonization. In most cases, this does not require treatment. If patient has signs and symptoms consistent with colitis, consider treatment. Requires ENTERIC precautions.  Clostridium Difficile by PCR     Status: Abnormal   Collection Time: 05/10/16  8:30 AM  Result Value Ref Range Status   Toxigenic C Difficile by pcr POSITIVE (A) NEGATIVE Final    Comment: CRITICAL RESULT CALLED TO, READ BACK BY AND VERIFIED WITH: Retia Passe MD 12:35 05/11/16 ([email or web address])     Radiology Reports Ct Abdomen Pelvis Wo Contrast  05/10/2016  CLINICAL DATA:  Abdominal pain post fall a few days prior EXAM: CT ABDOMEN AND PELVIS WITHOUT CONTRAST TECHNIQUE: Multidetector CT imaging of the abdomen and pelvis was performed following the standard protocol without IV contrast. COMPARISON:  CT 02/16/2016 FINDINGS: Lower chest: Small left pleural effusion, fluid measures simple fluid density. Mild atelectasis in both lower lobes. Liver: Partially obscured by motion.  No evidence of focal lesion. Hepatobiliary: Gallbladder physiologically distended, partially obscured by motion. No calcified stone. Difficult to exclude wall thickening. Pancreas: Partially obscured by motion. No evidence of traumatic injury. Atrophic parenchymal. Spleen: Splenic granuloma. No perisplenic fluid. No evidence of traumatic injury allowing for lack contrast. Adrenal glands: No hemorrhage or nodule. Kidneys: Bilateral nonspecific perinephric edema, stable from prior. No hydronephrosis. No focal abnormality.  Stomach/Bowel: Stomach physiologically distended. There are no dilated or thickened small bowel loops. Small-moderate volume of stool throughout the colon without colonic wall thickening. Distal colonic diverticulosis without diverticulitis. The appendix is normal. Vascular/Lymphatic: No retroperitoneal fluid, bleed, or adenopathy. Abdominal aorta is normal in caliber. Reproductive: Prominent sized prostate gland with mass effect on the bladder base. Brachytherapy seeds. Bladder: Physiologically distended, no wall thickening. Other: No free air, free fluid, or intra-abdominal fluid collection. Fat within both inguinal canals. Tiny fat containing umbilical hernia. Musculoskeletal: Left anterior rib fractures are remote. There are no acute or suspicious osseous abnormalities. Motion artifact limits detailed assessment. IMPRESSION: No acute or traumatic abnormality in the abdomen or pelvis allowing for patient motion and lack of intravenous contrast. Small left pleural effusion measures simple fluid density. Electronically Signed   By: Jeb Levering M.D.   On: 05/10/2016 03:49   Dg Chest 2 View  05/09/2016  CLINICAL DATA:  Fall 5 days prior with altered mental status. EXAM: CHEST  2 VIEW COMPARISON:  Most recent chest radiograph 03/23/2016. Additional priors radiographs reviewed. FINDINGS: The cardiomediastinal contours are unchanged, heart at the upper limits of  normal in size. There is thickening of the paratracheal stripes, stable and likely related to overlapping vascular structures. Obscuration of the left hemidiaphragm secondary to small left pleural effusion No pulmonary edema. No pneumothorax. No acute osseous abnormalities are seen. IMPRESSION: Small left pleural effusion. No pneumothorax or evidence of displaced rib fracture. Electronically Signed   By: Jeb Levering M.D.   On: 05/09/2016 21:29   Ct Head Wo Contrast  05/09/2016  CLINICAL DATA:  Initial evaluation for acute fall 5 days ago.  Bruising to left thigh. EXAM: CT HEAD WITHOUT CONTRAST CT MAXILLOFACIAL WITHOUT CONTRAST TECHNIQUE: Multidetector CT imaging of the head and maxillofacial structures were performed using the standard protocol without intravenous contrast. Multiplanar CT image reconstructions of the maxillofacial structures were also generated. COMPARISON:  None. FINDINGS: CT HEAD FINDINGS Diffuse prominence of the CSF containing spaces compatible with generalized cerebral atrophy. Mild to moderate chronic small vessel ischemic disease present. Scattered vascular calcifications within the carotid siphons. No acute intracranial hemorrhage. No acute large vessel territory infarct. No mass lesion, midline shift, or mass effect para ventricular prominence related to global parenchymal volume loss present without hydrocephalus. No extra-axial fluid collection. Left periorbital contusion. Calcification at the anterior aspect of the left globe. Globes themselves grossly intact. No retro-orbital process. Scalp soft tissues otherwise normal. No mastoid effusion. Calvarium intact. CT MAXILLOFACIAL FINDINGS Left periorbital contusion/ laceration. Two adjacent radiopaque calcific densities present anterior to the left globe measuring up to 7 mm (series 5, image 58). These are suspicious for retained debris. Left globe itself intact. Right globe normal. No retro-orbital hematoma or other pathology. Mild left facial contusion as well. No orbital floor fracture. Small focal old defect in the left lamina papyracea is new relative to prior exam, and likely reflects a small acute fracture (series 9, image 33). Associated 2 mm of displacement. Orbital roofs and lateral orbital walls are intact. Zygomatic arches intact. No acute maxillary fracture. Pterygoid plates intact. No acute nasal bone fracture. Nasal septum intact. Mandible intact. Mandibular condyles mildly subluxed anteriorly relative to the temporomandibular fossa, likely related to jaw  positioning. Poor dentition noted. No acute abnormality about the dentition itself. Chronic bilateral maxillary sinus disease again noted, stable. Minimal mucosal thickening within the ethmoidal air cells. Paranasal sinuses are otherwise clear. Visualized upper cervical spine grossly intact. IMPRESSION: CT HEAD: 1. No acute intracranial process. 2. Stable atrophy with chronic small vessel ischemic disease. CT MAXILLOFACIAL: 1. Left periorbital/facial contusion with left periorbital laceration. Scattered radiopaque densities anterior to the left globe likely reflect retained debris. Globe itself intact with no retro-orbital pathology. 2. Acute fracture of the left lamina papyracea with 2 mm of displacement. While this fracture somewhat age indeterminate in appearance, this is new relative to most recent CT from 03/22/2016, and is suspected to have occurred with this most recent trauma 3. Chronic maxillary sinusitis. Electronically Signed   By: Jeannine Boga M.D.   On: 05/09/2016 22:00   Ct Maxillofacial Wo Cm  05/09/2016  CLINICAL DATA:  Initial evaluation for acute fall 5 days ago. Bruising to left thigh. EXAM: CT HEAD WITHOUT CONTRAST CT MAXILLOFACIAL WITHOUT CONTRAST TECHNIQUE: Multidetector CT imaging of the head and maxillofacial structures were performed using the standard protocol without intravenous contrast. Multiplanar CT image reconstructions of the maxillofacial structures were also generated. COMPARISON:  None. FINDINGS: CT HEAD FINDINGS Diffuse prominence of the CSF containing spaces compatible with generalized cerebral atrophy. Mild to moderate chronic small vessel ischemic disease present. Scattered vascular calcifications within the carotid  siphons. No acute intracranial hemorrhage. No acute large vessel territory infarct. No mass lesion, midline shift, or mass effect para ventricular prominence related to global parenchymal volume loss present without hydrocephalus. No extra-axial fluid  collection. Left periorbital contusion. Calcification at the anterior aspect of the left globe. Globes themselves grossly intact. No retro-orbital process. Scalp soft tissues otherwise normal. No mastoid effusion. Calvarium intact. CT MAXILLOFACIAL FINDINGS Left periorbital contusion/ laceration. Two adjacent radiopaque calcific densities present anterior to the left globe measuring up to 7 mm (series 5, image 58). These are suspicious for retained debris. Left globe itself intact. Right globe normal. No retro-orbital hematoma or other pathology. Mild left facial contusion as well. No orbital floor fracture. Small focal old defect in the left lamina papyracea is new relative to prior exam, and likely reflects a small acute fracture (series 9, image 33). Associated 2 mm of displacement. Orbital roofs and lateral orbital walls are intact. Zygomatic arches intact. No acute maxillary fracture. Pterygoid plates intact. No acute nasal bone fracture. Nasal septum intact. Mandible intact. Mandibular condyles mildly subluxed anteriorly relative to the temporomandibular fossa, likely related to jaw positioning. Poor dentition noted. No acute abnormality about the dentition itself. Chronic bilateral maxillary sinus disease again noted, stable. Minimal mucosal thickening within the ethmoidal air cells. Paranasal sinuses are otherwise clear. Visualized upper cervical spine grossly intact. IMPRESSION: CT HEAD: 1. No acute intracranial process. 2. Stable atrophy with chronic small vessel ischemic disease. CT MAXILLOFACIAL: 1. Left periorbital/facial contusion with left periorbital laceration. Scattered radiopaque densities anterior to the left globe likely reflect retained debris. Globe itself intact with no retro-orbital pathology. 2. Acute fracture of the left lamina papyracea with 2 mm of displacement. While this fracture somewhat age indeterminate in appearance, this is new relative to most recent CT from 03/22/2016, and is  suspected to have occurred with this most recent trauma 3. Chronic maxillary sinusitis. Electronically Signed   By: Jeannine Boga M.D.   On: 05/09/2016 22:00    Time Spent in minutes  30   Velvet Bathe M.D on 05/12/2016 at 10:08 AM  Between 7am to 7pm - Pager - [phone]  After 7pm go to [email or web address] - password [facility]  [facility] -  Office  [phone]

## 2016-05-13 MED ORDER — CARVEDILOL 12.5 MG PO TABS: 12.5000 mg | ORAL_TABLET | Freq: Two times a day (BID) | ORAL | Status: DC

## 2016-05-13 MED ORDER — ADULT MULTIVITAMIN W/MINERALS CH: 1.0000 | ORAL_TABLET | Freq: Every day | ORAL | Status: DC

## 2016-05-13 MED ORDER — ACETAMINOPHEN 325 MG PO TABS: 650.0000 mg | ORAL_TABLET | Freq: Four times a day (QID) | ORAL | Status: DC | PRN

## 2016-05-13 MED ORDER — HYDROXYZINE HCL 10 MG PO TABS: 10.0000 mg | ORAL_TABLET | Freq: Three times a day (TID) | ORAL | Status: DC | PRN

## 2016-05-13 MED ORDER — LACTULOSE 10 GM/15ML PO SOLN: 30.0000 g | Freq: Two times a day (BID) | ORAL | Status: DC

## 2016-05-13 MED ORDER — NITROGLYCERIN 0.4 MG SL SUBL: 0.4000 mg | SUBLINGUAL_TABLET | SUBLINGUAL | Status: AC | PRN

## 2016-05-13 NOTE — Progress Notes (Signed)
Rana Snare with social work called, said pt and spouse had an argument in room when she was present regarding discharge placement.  Ultimately, however, [facility].  Everything is being finalized and [facility] scheduling transport for pickup at 3:00pm.

## 2016-05-13 NOTE — Clinical Social Work Note (Signed)
Clinical Social Work Assessment  Patient Details  Name: Joshua Henson MRN: [id number] Date of Birth: 06/28/1946  Date of referral:  05/13/16               Reason for consult:  Discharge Planning                Permission sought to share information with:  Facility Sport and exercise psychologist, Family Supports Permission granted to share information::  Yes, Verbal Permission Granted  Name::     Hotel manager::  SNFs  Relationship::  Wife  Contact Information:     Housing/Transportation Living arrangements for the past 2 months:  [place] of Information:  Patient, Spouse Patient Interpreter Needed:  None Criminal Activity/Legal Involvement Pertinent to Current Situation/Hospitalization:  No - Comment as needed Significant Relationships:  Spouse Lives with:  Self, Spouse Do you feel safe going back to the place where you live?  Yes Need for family participation in patient care:  Yes (Comment)  Care giving concerns:  Patient's wife would like for patient to rebuild his strength to return home. She reports that she is unable to supervise his safety.    Social Worker assessment / plan:  CSW met with patient at beside to complete assessment. Patient was resting comfortably in bed. CSW explained PT recommendation for SNF placement. CSW explained SNF search and placement process to the patient and answered his questions. Patient reported he is was in [place] before and would like to return if possible. Patient reported her/ his supports as his wife Joshua Henson. Per patient request CSW called Joshua Henson. She also reported patient was in [place] before and would like patient to return if possible. CSW will follow up with bed offers.  Employment status:  Retired Forensic scientist:  [employer] PT Recommendations:  [place] / Referral to community resources:  [place]  Patient/Family's Response to care:  The patient appears happy with  the care she is receiving in hospital and is appreciative of CSW assistance.  Patient/Family's Understanding of and Emotional Response to Diagnosis, Current Treatment, and Prognosis:  The patient has a good understanding of why he was  admitted. He understands that a SNF will be need post discharge.  Emotional Assessment Appearance:  Appears stated age Attitude/Demeanor/Rapport:   (Patient was welcoming of CSW and appropriate. ) Affect (typically observed):  Accepting, Calm, Appropriate Orientation:  Oriented to Self, Oriented to Place, Oriented to  Time, Oriented to Situation Alcohol / Substance use:  Not Applicable Psych involvement (Current and /or in the community):  No (Comment)  Discharge Needs  Concerns to be addressed:  Discharge Planning Concerns Readmission within the last 30 days:  No Current discharge risk:  Physical Impairment Barriers to Discharge:  Continued Medical Work up   [employer], LCSW 05/13/2016, 12:33 PM

## 2016-05-13 NOTE — Progress Notes (Signed)
Pt was unable to get a bed at [facility], the facility where he has had a previous stay.  He states that he would like to be discharged to home in the care of his adult daughter.  Dr. Wendee Beavers notified, he will put in for orders for D/C to home as well as home PT/OT/social work consults.

## 2016-05-13 NOTE — Consult Note (Signed)
   [facility] Inpatient Consult   05/13/2016  [place] Feb 03, 1946 [id number]    Patient screened for potential [facility] Care Management services. Chart reviewed. Noted discharge plan is for SNF.  There are no identifiable [facility] Care Management needs at this time. If patient's post hospital needs change, please place a [facility] Care Management consult. For questions please contact:  Marthenia Rolling, [place], RN,BSN [facility] Liaison [phone]

## 2016-05-13 NOTE — Progress Notes (Signed)
Report called to RN at [facility].  [address]?)

## 2016-05-13 NOTE — Clinical Social Work Placement (Signed)
   CLINICAL SOCIAL WORK PLACEMENT  NOTE  Date:  05/13/2016  Patient Details  Name: Joshua Henson MRN: [id number] Date of Birth: [birth year]  Clinical Social Work is seeking post-discharge placement for this patient at the [place] level of care (*CSW will initial, date and re-position this form in  chart as items are completed):  Yes   Patient/family provided with [place] Work Department's list of facilities offering this level of care within the geographic area requested by the patient (or if unable, by the patient's family).  Yes   Patient/family informed of their freedom to choose among providers that offer the needed level of care, that participate in Medicare, Medicaid or managed care program needed by the patient, have an available bed and are willing to accept the patient.  Yes   Patient/family informed of [place]'s ownership interest in [facility] and [facility], as well as of the fact that they are under no obligation to receive care at these facilities.  PASRR submitted to EDS on       PASRR number received on       Existing PASRR number confirmed on 05/13/16     FL2 transmitted to all facilities in geographic area requested by pt/family on 05/13/16     FL2 transmitted to all facilities within larger geographic area on       Patient informed that his/her managed care company has contracts with or will negotiate with certain facilities, including the following:        Yes   Patient/family informed of bed offers received.  Patient chooses bed at [facility]     Physician recommends and patient chooses bed at      Patient to be transferred to [facility] on 05/13/16.  Patient to be transferred to facility by       Patient family notified on 05/13/16 of transfer.  Name of family member notified:  [name] (pt's wife)     PHYSICIAN Please prepare priority discharge summary, including medications, Please sign FL2,  Please prepare prescriptions     Additional Comment:  Per MD patient is ready to discharge to [facility]. RN, patient, patient's family, and facility notified of discharge. RN given phone number for report and transport packet is on patient's chart. Ambulance transport requested. CSW signing off.   _______________________________________________ Samule Dry, LCSW 05/13/2016, 12:40 PM

## 2016-05-13 NOTE — Discharge Summary (Signed)
Physician Discharge Summary  Joshua Henson [id number] DOB: 11/05/1946 DOA: 05/09/2016  PCP: Mayra Neer, MD  Admit date: 05/09/2016 Discharge date: 05/13/2016  Time spent: > 35 minutes  Recommendations for Outpatient Follow-up:  1. Please evaluate and decide whether or not patient will require more medication for alcohol withdrawal. He has been here for 5 days on Ativan as needed. Will transition to Atarax on discharge   Discharge Diagnoses:  Active Problems:   Alcohol withdrawal (HCC)   CAD (coronary artery disease), native coronary artery   Frequent falls   Acute encephalopathy   Hypokalemia   Left upper quadrant pain   Discharge Condition: stable  Diet recommendation: heart healthy  Filed Weights   05/10/16 0251 05/10/16 0251  Weight: 83.8 kg (184 lb 11.9 oz) 81.647 kg (180 lb)    History of present illness:  70 y.o. male with a Past Medical History of prostate cancer, CVA, depression, HTN, insomnia, CAD, alcoholic cirrhosis, recurrent acute encephalopathy, who presents with acute mental status changes and Elevated troponins.  Hospital Course:  Elevated troponin -Patient seen and evaluated by cardiology who recommended continuing medical management with statin, beta blocker, and aspirin. Per their evaluation:in setting of known CAD by [facility] January 2017 w/ 60-70% stenosis of mid segment of the non dominant RCA. Likely due to demand ischemia  Generalized weakness -Patient has been evaluated by physical therapy is recommending SNF on discharge. Patient agreeable to SNF  Alcoholic withdrawal -Patient is awake and alert and oriented. He has had 5 days of benzodiazepines for acute alcoholic withdrawal. Will transition to hydroxyzine on discharge. No anxiety noted on evaluation prior to discharge.  Hypertension -Continue beta blocker  Encephalopathy -Secondary to alcohol use initially. Has resolved  Procedures:  None  Consultations:  Cardiology  Discharge  Exam: Filed Vitals:   05/12/16 2037 05/13/16 0602  BP: 156/89 138/81  Pulse: 83 77  Temp: 99 F (37.2 C) 99 F (37.2 C)  Resp: 17 17    General: Patient in no acute distress, alert and awake Cardiovascular: Regular rate and rhythm, no rubs Respiratory: No increased work of breathing, no wheezes  Discharge Instructions   Discharge Instructions    Call MD for:  difficulty breathing, headache or visual disturbances    Complete by:  As directed      Call MD for:  temperature >100.4    Complete by:  As directed      Diet - low sodium heart healthy    Complete by:  As directed      Discharge instructions    Complete by:  As directed   Please be sure to follow up with your primary care physician in 1-2 weeks or sooner should any new concerns arise.     Increase activity slowly    Complete by:  As directed           Current Discharge Medication List    START taking these medications   Details  acetaminophen (TYLENOL) 325 MG tablet Take 2 tablets (650 mg total) by mouth every 6 (six) hours as needed for mild pain (or Fever >/= 101).    hydrOXYzine (ATARAX/VISTARIL) 10 MG tablet Take 1 tablet (10 mg total) by mouth 3 (three) times daily as needed for anxiety. Qty: 30 tablet, Refills: 0    lactulose (CHRONULAC) 10 GM/15ML solution Take 45 mLs (30 g total) by mouth 2 (two) times daily. Qty: 240 mL, Refills: 0    Multiple Vitamin (MULTIVITAMIN WITH MINERALS) TABS tablet Take 1 tablet  by mouth daily.    nitroGLYCERIN (NITROSTAT) 0.4 MG SL tablet Place 1 tablet (0.4 mg total) under the tongue every 5 (five) minutes as needed for chest pain. Refills: 12      CONTINUE these medications which have CHANGED   Details  carvedilol (COREG) 12.5 MG tablet Take 1 tablet (12.5 mg total) by mouth 2 (two) times daily with a meal. Qty: 60 tablet, Refills: 0      CONTINUE these medications which have NOT CHANGED   Details  aspirin 81 MG EC tablet Take 1 tablet (81 mg total) by mouth  daily. Qty: 30 tablet, Refills: 12    atorvastatin (LIPITOR) 40 MG tablet Take 1 tablet (40 mg total) by mouth daily at 6 PM. Qty: 30 tablet, Refills: 0    buPROPion (WELLBUTRIN SR) 100 MG 12 hr tablet Take 200 mg by mouth daily.  Refills: 0    cholecalciferol (VITAMIN D) 1000 units tablet Take 2,000 Units by mouth daily.    citalopram (CELEXA) 20 MG tablet Take 20 mg by mouth daily.    Coenzyme Q10 (CO Q-10) 200 MG CAPS Take 200 mg by mouth daily.    folic acid (FOLVITE) 1 MG tablet Take 1 tablet (1 mg total) by mouth daily. Qty: 30 tablet, Refills: 1    Magnesium 500 MG CAPS Take 500 mg by mouth at bedtime.    Omega-3 Fatty Acids (FISH OIL) 1000 MG CAPS Take 1,000 mg by mouth 2 (two) times daily.     tamsulosin (FLOMAX) 0.4 MG CAPS capsule Take 0.4 mg by mouth daily.  Refills: 0    thiamine (VITAMIN B-1) 100 MG tablet Take 100 mg by mouth daily.      STOP taking these medications     ALPRAZolam (XANAX) 0.5 MG tablet      HYDROcodone-acetaminophen (NORCO) 10-325 MG tablet      LORazepam (ATIVAN) 1 MG tablet        Allergies  Allergen Reactions  . Lisinopril     syncope  . Celebrex [Celecoxib] Rash      The results of significant diagnostics from this hospitalization (including imaging, microbiology, ancillary and laboratory) are listed below for reference.    Significant Diagnostic Studies: Ct Abdomen Pelvis Wo Contrast  05/10/2016  CLINICAL DATA:  Abdominal pain post fall a few days prior EXAM: CT ABDOMEN AND PELVIS WITHOUT CONTRAST TECHNIQUE: Multidetector CT imaging of the abdomen and pelvis was performed following the standard protocol without IV contrast. COMPARISON:  CT 02/16/2016 FINDINGS: Lower chest: Small left pleural effusion, fluid measures simple fluid density. Mild atelectasis in both lower lobes. Liver: Partially obscured by motion.  No evidence of focal lesion. Hepatobiliary: Gallbladder physiologically distended, partially obscured by motion. No  calcified stone. Difficult to exclude wall thickening. Pancreas: Partially obscured by motion. No evidence of traumatic injury. Atrophic parenchymal. Spleen: Splenic granuloma. No perisplenic fluid. No evidence of traumatic injury allowing for lack contrast. Adrenal glands: No hemorrhage or nodule. Kidneys: Bilateral nonspecific perinephric edema, stable from prior. No hydronephrosis. No focal abnormality. Stomach/Bowel: Stomach physiologically distended. There are no dilated or thickened small bowel loops. Small-moderate volume of stool throughout the colon without colonic wall thickening. Distal colonic diverticulosis without diverticulitis. The appendix is normal. Vascular/Lymphatic: No retroperitoneal fluid, bleed, or adenopathy. Abdominal aorta is normal in caliber. Reproductive: Prominent sized prostate gland with mass effect on the bladder base. Brachytherapy seeds. Bladder: Physiologically distended, no wall thickening. Other: No free air, free fluid, or intra-abdominal fluid collection. Fat within both  inguinal canals. Tiny fat containing umbilical hernia. Musculoskeletal: Left anterior rib fractures are remote. There are no acute or suspicious osseous abnormalities. Motion artifact limits detailed assessment. IMPRESSION: No acute or traumatic abnormality in the abdomen or pelvis allowing for patient motion and lack of intravenous contrast. Small left pleural effusion measures simple fluid density. Electronically Signed   By: Jeb Levering M.D.   On: 05/10/2016 03:49   Dg Chest 2 View  05/09/2016  CLINICAL DATA:  Fall 5 days prior with altered mental status. EXAM: CHEST  2 VIEW COMPARISON:  Most recent chest radiograph 03/23/2016. Additional priors radiographs reviewed. FINDINGS: The cardiomediastinal contours are unchanged, heart at the upper limits of normal in size. There is thickening of the paratracheal stripes, stable and likely related to overlapping vascular structures. Obscuration of the left  hemidiaphragm secondary to small left pleural effusion No pulmonary edema. No pneumothorax. No acute osseous abnormalities are seen. IMPRESSION: Small left pleural effusion. No pneumothorax or evidence of displaced rib fracture. Electronically Signed   By: Jeb Levering M.D.   On: 05/09/2016 21:29   Ct Head Wo Contrast  05/09/2016  CLINICAL DATA:  Initial evaluation for acute fall 5 days ago. Bruising to left thigh. EXAM: CT HEAD WITHOUT CONTRAST CT MAXILLOFACIAL WITHOUT CONTRAST TECHNIQUE: Multidetector CT imaging of the head and maxillofacial structures were performed using the standard protocol without intravenous contrast. Multiplanar CT image reconstructions of the maxillofacial structures were also generated. COMPARISON:  None. FINDINGS: CT HEAD FINDINGS Diffuse prominence of the CSF containing spaces compatible with generalized cerebral atrophy. Mild to moderate chronic small vessel ischemic disease present. Scattered vascular calcifications within the carotid siphons. No acute intracranial hemorrhage. No acute large vessel territory infarct. No mass lesion, midline shift, or mass effect para ventricular prominence related to global parenchymal volume loss present without hydrocephalus. No extra-axial fluid collection. Left periorbital contusion. Calcification at the anterior aspect of the left globe. Globes themselves grossly intact. No retro-orbital process. Scalp soft tissues otherwise normal. No mastoid effusion. Calvarium intact. CT MAXILLOFACIAL FINDINGS Left periorbital contusion/ laceration. Two adjacent radiopaque calcific densities present anterior to the left globe measuring up to 7 mm (series 5, image 58). These are suspicious for retained debris. Left globe itself intact. Right globe normal. No retro-orbital hematoma or other pathology. Mild left facial contusion as well. No orbital floor fracture. Small focal old defect in the left lamina papyracea is new relative to prior exam, and likely  reflects a small acute fracture (series 9, image 33). Associated 2 mm of displacement. Orbital roofs and lateral orbital walls are intact. Zygomatic arches intact. No acute maxillary fracture. Pterygoid plates intact. No acute nasal bone fracture. Nasal septum intact. Mandible intact. Mandibular condyles mildly subluxed anteriorly relative to the temporomandibular fossa, likely related to jaw positioning. Poor dentition noted. No acute abnormality about the dentition itself. Chronic bilateral maxillary sinus disease again noted, stable. Minimal mucosal thickening within the ethmoidal air cells. Paranasal sinuses are otherwise clear. Visualized upper cervical spine grossly intact. IMPRESSION: CT HEAD: 1. No acute intracranial process. 2. Stable atrophy with chronic small vessel ischemic disease. CT MAXILLOFACIAL: 1. Left periorbital/facial contusion with left periorbital laceration. Scattered radiopaque densities anterior to the left globe likely reflect retained debris. Globe itself intact with no retro-orbital pathology. 2. Acute fracture of the left lamina papyracea with 2 mm of displacement. While this fracture somewhat age indeterminate in appearance, this is new relative to most recent CT from 03/22/2016, and is suspected to have occurred with this most  recent trauma 3. Chronic maxillary sinusitis. Electronically Signed   By: Jeannine Boga M.D.   On: 05/09/2016 22:00   Ct Maxillofacial Wo Cm  05/09/2016  CLINICAL DATA:  Initial evaluation for acute fall 5 days ago. Bruising to left thigh. EXAM: CT HEAD WITHOUT CONTRAST CT MAXILLOFACIAL WITHOUT CONTRAST TECHNIQUE: Multidetector CT imaging of the head and maxillofacial structures were performed using the standard protocol without intravenous contrast. Multiplanar CT image reconstructions of the maxillofacial structures were also generated. COMPARISON:  None. FINDINGS: CT HEAD FINDINGS Diffuse prominence of the CSF containing spaces compatible with  generalized cerebral atrophy. Mild to moderate chronic small vessel ischemic disease present. Scattered vascular calcifications within the carotid siphons. No acute intracranial hemorrhage. No acute large vessel territory infarct. No mass lesion, midline shift, or mass effect para ventricular prominence related to global parenchymal volume loss present without hydrocephalus. No extra-axial fluid collection. Left periorbital contusion. Calcification at the anterior aspect of the left globe. Globes themselves grossly intact. No retro-orbital process. Scalp soft tissues otherwise normal. No mastoid effusion. Calvarium intact. CT MAXILLOFACIAL FINDINGS Left periorbital contusion/ laceration. Two adjacent radiopaque calcific densities present anterior to the left globe measuring up to 7 mm (series 5, image 58). These are suspicious for retained debris. Left globe itself intact. Right globe normal. No retro-orbital hematoma or other pathology. Mild left facial contusion as well. No orbital floor fracture. Small focal old defect in the left lamina papyracea is new relative to prior exam, and likely reflects a small acute fracture (series 9, image 33). Associated 2 mm of displacement. Orbital roofs and lateral orbital walls are intact. Zygomatic arches intact. No acute maxillary fracture. Pterygoid plates intact. No acute nasal bone fracture. Nasal septum intact. Mandible intact. Mandibular condyles mildly subluxed anteriorly relative to the temporomandibular fossa, likely related to jaw positioning. Poor dentition noted. No acute abnormality about the dentition itself. Chronic bilateral maxillary sinus disease again noted, stable. Minimal mucosal thickening within the ethmoidal air cells. Paranasal sinuses are otherwise clear. Visualized upper cervical spine grossly intact. IMPRESSION: CT HEAD: 1. No acute intracranial process. 2. Stable atrophy with chronic small vessel ischemic disease. CT MAXILLOFACIAL: 1. Left  periorbital/facial contusion with left periorbital laceration. Scattered radiopaque densities anterior to the left globe likely reflect retained debris. Globe itself intact with no retro-orbital pathology. 2. Acute fracture of the left lamina papyracea with 2 mm of displacement. While this fracture somewhat age indeterminate in appearance, this is new relative to most recent CT from 03/22/2016, and is suspected to have occurred with this most recent trauma 3. Chronic maxillary sinusitis. Electronically Signed   By: Jeannine Boga M.D.   On: 05/09/2016 22:00    Microbiology: Recent Results (from the past 240 hour(s))  MRSA PCR Screening     Status: None   Collection Time: 05/10/16  8:30 AM  Result Value Ref Range Status   MRSA by PCR NEGATIVE NEGATIVE Final    Comment:        The GeneXpert MRSA Assay (FDA approved for NASAL specimens only), is one component of a comprehensive MRSA colonization surveillance program. It is not intended to diagnose MRSA infection nor to guide or monitor treatment for MRSA infections.   C difficile quick scan w PCR reflex     Status: Abnormal   Collection Time: 05/10/16  8:30 AM  Result Value Ref Range Status   C Diff antigen POSITIVE (A) NEGATIVE Final   C Diff toxin NEGATIVE NEGATIVE Final   C Diff interpretation  Final    C. difficile present, but toxin not detected. This indicates colonization. In most cases, this does not require treatment. If patient has signs and symptoms consistent with colitis, consider treatment. Requires ENTERIC precautions.  Clostridium Difficile by PCR     Status: Abnormal   Collection Time: 05/10/16  8:30 AM  Result Value Ref Range Status   Toxigenic C Difficile by pcr POSITIVE (A) NEGATIVE Final    Comment: CRITICAL RESULT CALLED TO, READ BACK BY AND VERIFIED WITH: Retia Passe MD 12:35 05/11/16 ([email or web address])      Labs: Basic Metabolic Panel:  Recent Labs Lab 05/09/16 2044 05/10/16 0456 05/11/16 0345  NA 139 138  140  K 3.3* 3.4* 3.5  CL 106 109 107  CO2 24 23 23   GLUCOSE 100* 108* 110*  BUN 10 8 <5*  CREATININE 0.67 0.66 0.66  CALCIUM 9.1 8.3* 9.1  MG  --  1.4*  --   PHOS  --  2.5  --    Liver Function Tests:  Recent Labs Lab 05/09/16 2044 05/10/16 0456  AST 31 28  ALT 20 17  ALKPHOS 186* 166*  BILITOT 1.1 1.1  PROT 6.5 5.8*  ALBUMIN 3.4* 3.1*   No results for input(s): LIPASE, AMYLASE in the last 168 hours.  Recent Labs Lab 05/09/16 2305  AMMONIA 43*   CBC:  Recent Labs Lab 05/09/16 2044 05/10/16 0456 05/10/16 0853 05/11/16 0345  WBC 5.7 6.5  --  8.5  HGB 12.7* 12.1*  --  12.2*  HCT 35.1* 34.5* 36.1* 36.7*  MCV 98.3 100.6*  --  101.7*  PLT 326 300  --  276   Cardiac Enzymes:  Recent Labs Lab 05/09/16 2305 05/10/16 0456  TROPONINI 11.59* 11.85*   BNP: BNP (last 3 results) No results for input(s): BNP in the last 8760 hours.  ProBNP (last 3 results) No results for input(s): PROBNP in the last 8760 hours.  CBG:  Recent Labs Lab 05/09/16 2032 05/10/16 1156 05/11/16 1205  GLUCAP 104* 102* 139*   Signed:  Velvet Bathe MD.  [facility] 05/13/2016, 9:09 AM

## 2016-05-13 NOTE — NC FL2 (Signed)
[place] LEVEL OF CARE SCREENING TOOL     IDENTIFICATION  Patient Name: Joshua Henson Birthdate: 01/12/1946 Sex: male Admission Date (Current Location): 05/09/2016  [facility] and [place] Number:  Herbalist and Address:  [facility] [place], [place] [address], [place], [place] [address]      Provider Number: [id number]  Attending Physician Name and Address:  Velvet Bathe, MD  Relative Name and Phone Number:       Current Level of Care: Hospital Recommended Level of Care: [place] Prior Approval Number:    Date Approved/Denied:   PASRR Number: [id number]  Discharge Plan: SNF    Current Diagnoses: Patient Active Problem List   Diagnosis Date Noted  . Left upper quadrant pain 05/10/2016  . Encephalopathy 05/09/2016  . C. difficile colitis 03/30/2016  . Acute kidney injury ([place]) 03/30/2016  . Essential hypertension 03/23/2016  . Fall   . Tobacco abuse   . Hypokalemia   . Hypomagnesemia   . Hypophosphatemia   . CAP (community acquired pneumonia) due to MSSA (methicillin sensitive Staphylococcus aureus) ([place])   . CAD in native artery   . Cocaine abuse   . Alcohol abuse with intoxication ([place]) 01/11/2016  . Frequent falls 01/11/2016  . Left rib fracture 01/11/2016  . Acute encephalopathy 01/11/2016  . Polysubstance abuse 01/11/2016  . Delirium tremens ([place]) 01/11/2016  . NSTEMI (non-ST elevated myocardial infarction) ([place]) 01/11/2016  . Sepsis due to Enterococcus with acute renal failure and metabolic encephalopathy ([place]) 01/11/2016  . Alcohol intoxication ([place])   . Chronic alcohol abuse   . Acute respiratory failure with hypoxia ([place])   . Elevated troponin 01/10/2016  . Chest pain 01/10/2016  . CAD (coronary artery disease), native coronary artery 01/10/2016  . Fatty liver, alcoholic [id number]  . Tremor 04/02/2015  . Ataxia   . Alcohol withdrawal ([place]) 09/03/2014  . Unspecified cerebral artery occlusion  with cerebral infarction 07/05/2014  . Alcohol abuse 07/02/2014  . Current tobacco use 01/17/2014  . Hearing loss 11/25/2013  . Tinnitus of both ears 11/25/2013    Orientation RESPIRATION BLADDER Height & Weight     Self, Time, Situation, Place  Normal External catheter, Incontinent Weight: 180 lb (81.647 kg) Height:  5\' 10"  (177.8 cm)  BEHAVIORAL SYMPTOMS/MOOD NEUROLOGICAL BOWEL NUTRITION STATUS   (None)  (None) Continent Diet (heart )  AMBULATORY STATUS COMMUNICATION OF NEEDS Skin   Extensive Assist Verbally Normal                       Personal Care Assistance Level of Assistance  Bathing, Feeding, Dressing Bathing Assistance: Limited assistance Feeding assistance: Independent Dressing Assistance: Limited assistance     Functional Limitations Info  Sight, Hearing, Speech Sight Info: Adequate Hearing Info: Impaired Speech Info: Adequate    SPECIAL CARE FACTORS FREQUENCY  PT (By licensed PT), OT (By licensed OT)     PT Frequency: 5/ week OT Frequency: 5/ week            Contractures Contractures Info: Not present    Additional Factors Info  Code Status, Allergies, Psychotropic, Isolation Precautions Code Status Info: FULL Allergies Info: Lisinopril, Celebrex Celecoxib Psychotropic Info: hydrOXYzine (ATARAX/VISTARIL) 10 MG tablet Take 1 tablet (10 mg total) by mouth 3 (three) times daily as needed for anxiety.  buPROPion (WELLBUTRIN SR) 100 MG 12 hr tablet Take 200 mg by mouth daily.   Isolation Precautions Info: Enteric precautions (UV disinfection)  Current Medications (05/13/2016):  This is the current hospital active medication list Current Facility-Administered Medications  Medication Dose Route Frequency Provider Last Rate Last Dose  . 0.9 % NaCl with KCl 20 mEq/ L  infusion   Intravenous Continuous Radene Gunning, NP 50 mL/hr at 05/12/16 2146    . acetaminophen (TYLENOL) tablet 650 mg  650 mg Oral Q6H PRN Toy Baker, MD       Or  .  acetaminophen (TYLENOL) suppository 650 mg  650 mg Rectal Q6H PRN Toy Baker, MD      . aspirin EC tablet 81 mg  81 mg Oral Daily Toy Baker, MD   81 mg at 05/13/16 0812  . atorvastatin (LIPITOR) tablet 40 mg  40 mg Oral q1800 Toy Baker, MD   40 mg at 05/12/16 1730  . buPROPion (WELLBUTRIN SR) 12 hr tablet 100 mg  100 mg Oral BID Edwin Dada, MD   100 mg at 05/13/16 [id number]  . carvedilol (COREG) tablet 12.5 mg  12.5 mg Oral BID WC Norman Herrlich, MD   12.5 mg at 05/13/16 [id number]  . citalopram (CELEXA) tablet 20 mg  20 mg Oral QHS Radene Gunning, NP   20 mg at 05/12/16 2153  . cloNIDine (CATAPRES) tablet 0.1 mg  0.1 mg Oral Q6H PRN Radene Gunning, NP   0.1 mg at 05/10/16 1706  . enoxaparin (LOVENOX) injection 40 mg  40 mg Subcutaneous Q24H Peter M Martinique, MD   40 mg at 05/12/16 2151  . folic acid (FOLVITE) tablet 1 mg  1 mg Oral Daily Toy Baker, MD   1 mg at 05/13/16 [id number]  . HYDROcodone-acetaminophen (NORCO/VICODIN) 5-325 MG per tablet 1-2 tablet  1-2 tablet Oral Q4H PRN Toy Baker, MD   2 tablet at 05/13/16 0812  . lactulose (CHRONULAC) 10 GM/15ML solution 30 g  30 g Oral BID Toy Baker, MD   30 g at 05/13/16 0813  . LORazepam (ATIVAN) tablet 1 mg  1 mg Oral Q6H PRN Radene Gunning, NP   1 mg at 05/13/16 0019   Or  . LORazepam (ATIVAN) injection 1 mg  1 mg Intravenous Q6H PRN Radene Gunning, NP   1 mg at 05/11/16 2143  . magnesium oxide (MAG-OX) tablet 400 mg  400 mg Oral QHS Velvet Bathe, MD   400 mg at 05/12/16 2154  . morphine 2 MG/ML injection 2 mg  2 mg Intravenous Q4H PRN Toy Baker, MD   2 mg at 05/11/16 2343  . multivitamin with minerals tablet 1 tablet  1 tablet Oral Daily Radene Gunning, NP   1 tablet at 05/13/16 [id number]  . nitroGLYCERIN (NITROSTAT) SL tablet 0.4 mg  0.4 mg Sublingual Q5 min PRN Toy Baker, MD      . ondansetron (ZOFRAN) tablet 4 mg  4 mg Oral Q6H PRN Toy Baker, MD       Or  . ondansetron (ZOFRAN)  injection 4 mg  4 mg Intravenous Q6H PRN Toy Baker, MD      . sodium chloride flush (NS) 0.9 % injection 3 mL  3 mL Intravenous Q12H Toy Baker, MD   3 mL at 05/12/16 2156  . tamsulosin (FLOMAX) capsule 0.4 mg  0.4 mg Oral Daily Toy Baker, MD   0.4 mg at 05/13/16 0812  . thiamine (VITAMIN B-1) tablet 100 mg  100 mg Oral Daily Waldemar Dickens, MD   100 mg at 05/13/16 [id number]     Discharge Medications: Please  see discharge summary for a list of discharge medications.  Relevant Imaging Results:  Relevant Lab Results:   Additional Information SSN: [id number]  Samule Dry, LCSW

## 2016-05-17 ENCOUNTER — Other Ambulatory Visit: Payer: Self-pay | Admitting: *Deleted

## 2016-05-17 ENCOUNTER — Non-Acute Institutional Stay (SKILLED_NURSING_FACILITY): Payer: Medicare Other | Admitting: Adult Health

## 2016-05-17 ENCOUNTER — Encounter: Payer: Self-pay | Admitting: Adult Health

## 2016-05-17 DIAGNOSIS — N400 Enlarged prostate without lower urinary tract symptoms: Secondary | ICD-10-CM | POA: Diagnosis not present

## 2016-05-17 DIAGNOSIS — E46 Unspecified protein-calorie malnutrition: Secondary | ICD-10-CM

## 2016-05-17 DIAGNOSIS — I10 Essential (primary) hypertension: Secondary | ICD-10-CM | POA: Diagnosis not present

## 2016-05-17 DIAGNOSIS — F10930 Alcohol use, unspecified with withdrawal, uncomplicated: Secondary | ICD-10-CM

## 2016-05-17 DIAGNOSIS — F329 Major depressive disorder, single episode, unspecified: Secondary | ICD-10-CM | POA: Diagnosis not present

## 2016-05-17 DIAGNOSIS — R531 Weakness: Secondary | ICD-10-CM

## 2016-05-17 DIAGNOSIS — E785 Hyperlipidemia, unspecified: Secondary | ICD-10-CM

## 2016-05-17 DIAGNOSIS — I2510 Atherosclerotic heart disease of native coronary artery without angina pectoris: Secondary | ICD-10-CM

## 2016-05-17 DIAGNOSIS — F10230 Alcohol dependence with withdrawal, uncomplicated: Secondary | ICD-10-CM

## 2016-05-17 DIAGNOSIS — F32A Depression, unspecified: Secondary | ICD-10-CM

## 2016-05-17 DIAGNOSIS — R778 Other specified abnormalities of plasma proteins: Secondary | ICD-10-CM

## 2016-05-17 DIAGNOSIS — R7989 Other specified abnormal findings of blood chemistry: Secondary | ICD-10-CM | POA: Diagnosis not present

## 2016-05-17 NOTE — Progress Notes (Signed)
Patient ID: Joshua Henson, male   DOB: 28-Feb-1946, 70 y.o.   MRN: [id number]    DATE:  05/17/2016   MRN:  [id number]  BIRTHDAY: May 09, 1946  Facility:  Nursing Home Location:  [place] Room Number: 1204-P  LEVEL OF CARE:  SNF (31)  Contact Information    Name Relation Home Work [place] Significant other [place]   [phone]   Ground,Alexandra Daughter [phone]         Code Status History    Date Active Date Inactive Code Status Order ID Comments User Context   05/10/2016  1:49 AM 05/13/2016  4:51 PM Full Code [id number]  Toy Baker, MD ED   03/23/2016  4:48 PM 04/04/2016  4:36 PM Full Code [id number]  Cristal Ford, DO Inpatient   01/18/2016  9:25 AM 01/19/2016  9:46 PM Full Code [id number]  Cherene Altes, MD Inpatient   01/15/2016  3:37 PM 01/18/2016  9:25 AM Full Code [id number]  Burnell Blanks, MD Inpatient   01/10/2016  9:58 PM 01/15/2016  3:37 PM Full Code [id number]  Norval Morton, MD Inpatient   04/02/2015  7:29 PM 04/08/2015  7:38 PM Full Code [id number]  Deneise Lever, MD ED   12/09/2014 10:27 PM 12/11/2014 12:14 PM Full Code [id number]  Evelina Bucy, MD ED   12/09/2014  4:22 PM 12/09/2014 10:27 PM Full Code [id number]  Charlott Rakes, MD ED   09/03/2014  1:19 AM 09/12/2014  3:50 PM Full Code [id number]  Allie Bossier, MD ED   07/07/2014  4:31 PM 07/11/2014  5:39 PM Full Code [id number]  Cathlyn Parsons, PA-C Inpatient   07/02/2014  3:38 AM 07/07/2014  4:31 PM Full Code [id number]  Theressa Millard, MD Inpatient       Chief Complaint  Patient presents with  . Hospitalization Follow-up    HISTORY OF PRESENT ILLNESS:  This is a 70 year old male who has been admitted to [facility] on 05/13/16 from [place] has PMH of prostate cancer, CVA, depression, hypertension, insomnia, CAD, alcoholic cirrhosis and recurrent acute encephalopathy. He was having acute  mental status changes and was brought to the hospital. Wife was concerned that he was drinking more than usual. Patient has a known history of heavy alcohol abuse. He was noted to have elevated troponin of 11.59, urine rapid drug screen positive for opiates and benzodiazepines, alcohol level of 9 (elevated), CT head negative for hemorrhage and CXR positive for small left pleural effusion.  Cardiology was consulted and he was started on heparin drip. It was felt that troponin was elevated due to demand ischemia in setting of known CAD by [facility] January 2017 w/ 60-70% stenosis of mid segment of the non dominant RCA.  He has been admitted for a short-term rehabilitation.  PAST MEDICAL HISTORY:  Past Medical History  Diagnosis Date  . Prostate cancer ([place]) 2010    External beam radiation (urol - Risa Grill, XRT Valere Dross)  . Stroke ([place])   . Depression   . Tachycardia   . Hypertension   . Insomnia   . CAD (coronary artery disease), native coronary artery 01/10/2016    3 vessel coronary calcification noted on CT scan 04/03/15    . Fatty liver, alcoholic [id number]  . Acute encephalopathy   . NSTEMI (non-ST elevated myocardial infarction) [facility])      CURRENT MEDICATIONS: Reviewed  Patient's Medications  New Prescriptions  No medications on file  Previous Medications   ACETAMINOPHEN (TYLENOL) 325 MG TABLET    Take 2 tablets (650 mg total) by mouth every 6 (six) hours as needed for mild pain (or Fever >/= 101).   ASPIRIN 81 MG EC TABLET    Take 1 tablet (81 mg total) by mouth daily.   ATORVASTATIN (LIPITOR) 40 MG TABLET    Take 1 tablet (40 mg total) by mouth daily at 6 PM.   BUPROPION (WELLBUTRIN SR) 100 MG 12 HR TABLET    Take 200 mg by mouth daily.    CARVEDILOL (COREG) 12.5 MG TABLET    Take 1 tablet (12.5 mg total) by mouth 2 (two) times daily with a meal.   CHOLECALCIFEROL (VITAMIN D) 1000 UNITS TABLET    Take 2,000 Units by mouth daily.   CITALOPRAM (CELEXA) 20 MG TABLET    Take 20 mg by mouth  daily.   COENZYME Q10 (CO Q-10) 200 MG CAPS    Take 200 mg by mouth daily.   FOLIC ACID (FOLVITE) 1 MG TABLET    Take 1 tablet (1 mg total) by mouth daily.   HYDROXYZINE (ATARAX/VISTARIL) 10 MG TABLET    Take 1 tablet (10 mg total) by mouth 3 (three) times daily as needed for anxiety.   LACTULOSE (CHRONULAC) 10 GM/15ML SOLUTION    Take 45 mLs (30 g total) by mouth 2 (two) times daily.   MAGNESIUM 500 MG CAPS    Take 500 mg by mouth at bedtime.   MULTIPLE VITAMIN (MULTIVITAMIN WITH MINERALS) TABS TABLET    Take 1 tablet by mouth daily.   NITROGLYCERIN (NITROSTAT) 0.4 MG SL TABLET    Place 1 tablet (0.4 mg total) under the tongue every 5 (five) minutes as needed for chest pain.   OMEGA-3 FATTY ACIDS (FISH OIL) 1000 MG CAPS    Take 1,000 mg by mouth 2 (two) times daily.    PROTEIN (PROCEL 100) POWD    Take 1 scoop by mouth 2 (two) times daily.   TAMSULOSIN (FLOMAX) 0.4 MG CAPS CAPSULE    Take 0.4 mg by mouth daily.    THIAMINE (VITAMIN B-1) 100 MG TABLET    Take 100 mg by mouth daily.  Modified Medications   No medications on file  Discontinued Medications   No medications on file     Allergies  Allergen Reactions  . Lisinopril     syncope  . Celebrex [Celecoxib] Rash     REVIEW OF SYSTEMS:  GENERAL: no change in appetite, no fatigue, no weight changes, no fever, chills or weakness EYES: Denies change in vision, dry eyes, eye pain, itching or discharge EARS: Denies change in hearing, ringing in ears, or earache NOSE: Denies nasal congestion or epistaxis MOUTH and THROAT: Denies oral discomfort, gingival pain or bleeding, pain from teeth or hoarseness   RESPIRATORY: no cough, SOB, DOE, wheezing, hemoptysis CARDIAC: no chest pain, edema or palpitations GI: no abdominal pain, diarrhea, constipation, heart burn, nausea or vomiting GU: Denies dysuria, frequency, hematuria, incontinence, or discharge PSYCHIATRIC: Denies feeling of depression. No report of hallucinations, insomnia,  paranoia, or agitation; +anxiety   PHYSICAL EXAMINATION  GENERAL APPEARANCE: Well nourished. In no acute distress. Normal body habitus SKIN:  Bruising on left cheek  HEAD: Normal in size and contour. No evidence of trauma EYES: Lids open and close normally. No blepharitis, entropion or ectropion. PERRL. Conjunctivae are clear and sclerae are white. Lenses are without opacity EARS: Pinnae are normal. Patient hears  normal voice tunes of the examiner MOUTH and THROAT: Lips are without lesions. Oral mucosa is moist and without lesions. Tongue is normal in shape, size, and color and without lesions NECK: supple, trachea midline, no neck masses, no thyroid tenderness, no thyromegaly LYMPHATICS: no LAN in the neck, no supraclavicular LAN RESPIRATORY: breathing is even & unlabored, BS CTAB CARDIAC: RRR, no murmur,no extra heart sounds, no edema GI: abdomen soft, normal BS, no masses, no tenderness, no hepatomegaly, no splenomegaly EXTREMITIES:  Able to move 4 extremities PSYCHIATRIC: Alert and oriented X 3. Anxious and behavior are appropriate  LABS/RADIOLOGY: Labs reviewed: Basic Metabolic Panel:  Recent Labs  01/18/16 0517 01/19/16 0505  03/24/16 0321  04/04/16 0529  05/09/16 2044 05/10/16 0456 05/11/16 0345  NA 141 141  < > 137  < > 141  < > 139 138 140  K 3.1* 3.6  < > 3.2*  < > 4.1  < > 3.3* 3.4* 3.5  CL 106 107  < > 105  < > 111  --  106 109 107  CO2 25 24  < > 23  < > 22  --  24 23 23   GLUCOSE 111* 107*  < > 129*  < > 102*  --  100* 108* 110*  BUN 10 10  < > 13  < > 13  < > 10 8 <5*  CREATININE 0.87 0.80  < > 0.72  < > 0.87  < > 0.67 0.66 0.66  CALCIUM 8.5* 8.6*  < > 8.6*  < > 9.2  --  9.1 8.3* 9.1  MG 1.8 2.0  < > 1.9  --  1.8  --   --  1.4*  --   PHOS 2.4* 3.1  --   --   --   --   --   --  2.5  --   < > = values in this interval not displayed. Liver Function Tests:  Recent Labs  03/28/16 0526 04/07/16 05/09/16 2044 05/10/16 0456  AST 78* 53* 31 28  ALT 61 67* 20  17  ALKPHOS 86 88 186* 166*  BILITOT 1.4*  --  1.1 1.1  PROT 7.2  --  6.5 5.8*  ALBUMIN 3.7  --  3.4* 3.1*   No results for input(s): LIPASE, AMYLASE in the last 8760 hours.  Recent Labs  03/27/16 1219 05/09/16 2305  AMMONIA 25 43*   CBC:  Recent Labs  03/22/16 0030 03/23/16 1311  04/07/16 05/09/16 2044 05/10/16 0456 05/10/16 0853 05/11/16 0345  WBC 7.3 7.4  < > 6.7 5.7 6.5  --  8.5  NEUTROABS 5.2 5.9  --  4  --   --   --   --   HGB 13.5 11.8*  < > 11.8* 12.7* 12.1*  --  12.2*  HCT 37.5* 32.1*  < > 27* 35.1* 34.5* 36.1* 36.7*  MCV 100.5* 100.9*  < >  --  98.3 100.6*  --  101.7*  PLT 162 141*  < > 288 326 300  --  276  < > = values in this interval not displayed.  Cardiac Enzymes:  Recent Labs  01/10/16 1712  01/11/16 0528 05/09/16 2305 05/10/16 0456  CKTOTAL 47*  --  82  --   --   CKMB 1.3  --   --   --   --   TROPONINI 4.59*  < > 4.46* 11.59* 11.85*  < > = values in this interval not displayed. BNP: Invalid input(s): POCBNP  CBG:  Recent Labs  05/09/16 2032 05/10/16 1156 05/11/16 1205  GLUCAP 104* 102* 139*      Ct Abdomen Pelvis Wo Contrast  05/10/2016  CLINICAL DATA:  Abdominal pain post fall a few days prior EXAM: CT ABDOMEN AND PELVIS WITHOUT CONTRAST TECHNIQUE: Multidetector CT imaging of the abdomen and pelvis was performed following the standard protocol without IV contrast. COMPARISON:  CT 02/16/2016 FINDINGS: Lower chest: Small left pleural effusion, fluid measures simple fluid density. Mild atelectasis in both lower lobes. Liver: Partially obscured by motion.  No evidence of focal lesion. Hepatobiliary: Gallbladder physiologically distended, partially obscured by motion. No calcified stone. Difficult to exclude wall thickening. Pancreas: Partially obscured by motion. No evidence of traumatic injury. Atrophic parenchymal. Spleen: Splenic granuloma. No perisplenic fluid. No evidence of traumatic injury allowing for lack contrast. Adrenal glands: No  hemorrhage or nodule. Kidneys: Bilateral nonspecific perinephric edema, stable from prior. No hydronephrosis. No focal abnormality. Stomach/Bowel: Stomach physiologically distended. There are no dilated or thickened small bowel loops. Small-moderate volume of stool throughout the colon without colonic wall thickening. Distal colonic diverticulosis without diverticulitis. The appendix is normal. Vascular/Lymphatic: No retroperitoneal fluid, bleed, or adenopathy. Abdominal aorta is normal in caliber. Reproductive: Prominent sized prostate gland with mass effect on the bladder base. Brachytherapy seeds. Bladder: Physiologically distended, no wall thickening. Other: No free air, free fluid, or intra-abdominal fluid collection. Fat within both inguinal canals. Tiny fat containing umbilical hernia. Musculoskeletal: Left anterior rib fractures are remote. There are no acute or suspicious osseous abnormalities. Motion artifact limits detailed assessment. IMPRESSION: No acute or traumatic abnormality in the abdomen or pelvis allowing for patient motion and lack of intravenous contrast. Small left pleural effusion measures simple fluid density. Electronically Signed   By: Jeb Levering M.D.   On: 05/10/2016 03:49   Dg Chest 2 View  05/09/2016  CLINICAL DATA:  Fall 5 days prior with altered mental status. EXAM: CHEST  2 VIEW COMPARISON:  Most recent chest radiograph 03/23/2016. Additional priors radiographs reviewed. FINDINGS: The cardiomediastinal contours are unchanged, heart at the upper limits of normal in size. There is thickening of the paratracheal stripes, stable and likely related to overlapping vascular structures. Obscuration of the left hemidiaphragm secondary to small left pleural effusion No pulmonary edema. No pneumothorax. No acute osseous abnormalities are seen. IMPRESSION: Small left pleural effusion. No pneumothorax or evidence of displaced rib fracture. Electronically Signed   By: Jeb Levering  M.D.   On: 05/09/2016 21:29   Ct Head Wo Contrast  05/09/2016  CLINICAL DATA:  Initial evaluation for acute fall 5 days ago. Bruising to left thigh. EXAM: CT HEAD WITHOUT CONTRAST CT MAXILLOFACIAL WITHOUT CONTRAST TECHNIQUE: Multidetector CT imaging of the head and maxillofacial structures were performed using the standard protocol without intravenous contrast. Multiplanar CT image reconstructions of the maxillofacial structures were also generated. COMPARISON:  None. FINDINGS: CT HEAD FINDINGS Diffuse prominence of the CSF containing spaces compatible with generalized cerebral atrophy. Mild to moderate chronic small vessel ischemic disease present. Scattered vascular calcifications within the carotid siphons. No acute intracranial hemorrhage. No acute large vessel territory infarct. No mass lesion, midline shift, or mass effect para ventricular prominence related to global parenchymal volume loss present without hydrocephalus. No extra-axial fluid collection. Left periorbital contusion. Calcification at the anterior aspect of the left globe. Globes themselves grossly intact. No retro-orbital process. Scalp soft tissues otherwise normal. No mastoid effusion. Calvarium intact. CT MAXILLOFACIAL FINDINGS Left periorbital contusion/ laceration. Two adjacent radiopaque calcific densities present anterior  to the left globe measuring up to 7 mm (series 5, image 58). These are suspicious for retained debris. Left globe itself intact. Right globe normal. No retro-orbital hematoma or other pathology. Mild left facial contusion as well. No orbital floor fracture. Small focal old defect in the left lamina papyracea is new relative to prior exam, and likely reflects a small acute fracture (series 9, image 33). Associated 2 mm of displacement. Orbital roofs and lateral orbital walls are intact. Zygomatic arches intact. No acute maxillary fracture. Pterygoid plates intact. No acute nasal bone fracture. Nasal septum intact.  Mandible intact. Mandibular condyles mildly subluxed anteriorly relative to the temporomandibular fossa, likely related to jaw positioning. Poor dentition noted. No acute abnormality about the dentition itself. Chronic bilateral maxillary sinus disease again noted, stable. Minimal mucosal thickening within the ethmoidal air cells. Paranasal sinuses are otherwise clear. Visualized upper cervical spine grossly intact. IMPRESSION: CT HEAD: 1. No acute intracranial process. 2. Stable atrophy with chronic small vessel ischemic disease. CT MAXILLOFACIAL: 1. Left periorbital/facial contusion with left periorbital laceration. Scattered radiopaque densities anterior to the left globe likely reflect retained debris. Globe itself intact with no retro-orbital pathology. 2. Acute fracture of the left lamina papyracea with 2 mm of displacement. While this fracture somewhat age indeterminate in appearance, this is new relative to most recent CT from 03/22/2016, and is suspected to have occurred with this most recent trauma 3. Chronic maxillary sinusitis. Electronically Signed   By: Jeannine Boga M.D.   On: 05/09/2016 22:00   Ct Maxillofacial Wo Cm  05/09/2016  CLINICAL DATA:  Initial evaluation for acute fall 5 days ago. Bruising to left thigh. EXAM: CT HEAD WITHOUT CONTRAST CT MAXILLOFACIAL WITHOUT CONTRAST TECHNIQUE: Multidetector CT imaging of the head and maxillofacial structures were performed using the standard protocol without intravenous contrast. Multiplanar CT image reconstructions of the maxillofacial structures were also generated. COMPARISON:  None. FINDINGS: CT HEAD FINDINGS Diffuse prominence of the CSF containing spaces compatible with generalized cerebral atrophy. Mild to moderate chronic small vessel ischemic disease present. Scattered vascular calcifications within the carotid siphons. No acute intracranial hemorrhage. No acute large vessel territory infarct. No mass lesion, midline shift, or mass  effect para ventricular prominence related to global parenchymal volume loss present without hydrocephalus. No extra-axial fluid collection. Left periorbital contusion. Calcification at the anterior aspect of the left globe. Globes themselves grossly intact. No retro-orbital process. Scalp soft tissues otherwise normal. No mastoid effusion. Calvarium intact. CT MAXILLOFACIAL FINDINGS Left periorbital contusion/ laceration. Two adjacent radiopaque calcific densities present anterior to the left globe measuring up to 7 mm (series 5, image 58). These are suspicious for retained debris. Left globe itself intact. Right globe normal. No retro-orbital hematoma or other pathology. Mild left facial contusion as well. No orbital floor fracture. Small focal old defect in the left lamina papyracea is new relative to prior exam, and likely reflects a small acute fracture (series 9, image 33). Associated 2 mm of displacement. Orbital roofs and lateral orbital walls are intact. Zygomatic arches intact. No acute maxillary fracture. Pterygoid plates intact. No acute nasal bone fracture. Nasal septum intact. Mandible intact. Mandibular condyles mildly subluxed anteriorly relative to the temporomandibular fossa, likely related to jaw positioning. Poor dentition noted. No acute abnormality about the dentition itself. Chronic bilateral maxillary sinus disease again noted, stable. Minimal mucosal thickening within the ethmoidal air cells. Paranasal sinuses are otherwise clear. Visualized upper cervical spine grossly intact. IMPRESSION: CT HEAD: 1. No acute intracranial process. 2. Stable atrophy  with chronic small vessel ischemic disease. CT MAXILLOFACIAL: 1. Left periorbital/facial contusion with left periorbital laceration. Scattered radiopaque densities anterior to the left globe likely reflect retained debris. Globe itself intact with no retro-orbital pathology. 2. Acute fracture of the left lamina papyracea with 2 mm of  displacement. While this fracture somewhat age indeterminate in appearance, this is new relative to most recent CT from 03/22/2016, and is suspected to have occurred with this most recent trauma 3. Chronic maxillary sinusitis. Electronically Signed   By: Jeannine Boga M.D.   On: 05/09/2016 22:00    ASSESSMENT/PLAN:  Generalized weakness - for rehabilitation, PT and OT  Elevated troponin - felt that troponin was elevated due to demand ischemia in setting of known CAD by [facility] January 2017 w/ 60-70% stenosis of mid segment of the non dominant RCA; continue Coreg 12.5 mg 1 tab by mouth twice a day, atorvastatin 40 mg 1 tab by mouth every 6 p.m. and aspirin EC 81 mg 1 tab by mouth daily  Alcoholic withdrawal - continue hydroxyzine 10 mg 1 tab by mouth 3 times a day when necessary and start Ativan 0.5 mg 1 tab by mouth twice a day when necessary; continue folic acid 1 mg daily and thiamine 100 mg daily  Depression - mood is stable; continue bupropion SR 200 mg 1 tab by mouth daily and Celexa 20 mg 1 tab by mouth daily  BPH - continue Flomax 0.4 mg 1 capsule by mouth daily  Elevated ammonia level - ammonia 43 ; continue lactulose 10 g per 15 mL solution. 37 ML twice a day; check ammonia level  Hypertension - continue Coreg 12.5 mg 1 tab by mouth twice a day; BP/HR  twice a day 1 week  Protein calorie malnutrition  - albumin 3.1; continue Procel 1 scoop by mouth twice a day  Hyperlipidemia - continue atorvastatin 40 mg 1 tab by mouth every 6 p.m.  Hypomagnesemia - magnesium 1.4; continue magnesium 500 mg 1 tab by mouth daily at bedtime; check magnesium level  CAD - continue aspirin 81 mg daily and NTG when necessary     Goals of care:  Short-term rehabilitation    Durenda Age, NP [place]

## 2016-05-18 ENCOUNTER — Other Ambulatory Visit: Payer: Self-pay | Admitting: *Deleted

## 2016-05-18 ENCOUNTER — Non-Acute Institutional Stay (SKILLED_NURSING_FACILITY): Payer: Medicare Other | Admitting: Internal Medicine

## 2016-05-18 ENCOUNTER — Encounter: Payer: Self-pay | Admitting: Internal Medicine

## 2016-05-18 DIAGNOSIS — R7989 Other specified abnormal findings of blood chemistry: Secondary | ICD-10-CM

## 2016-05-18 DIAGNOSIS — F411 Generalized anxiety disorder: Secondary | ICD-10-CM | POA: Diagnosis not present

## 2016-05-18 DIAGNOSIS — I2510 Atherosclerotic heart disease of native coronary artery without angina pectoris: Secondary | ICD-10-CM

## 2016-05-18 DIAGNOSIS — F329 Major depressive disorder, single episode, unspecified: Secondary | ICD-10-CM | POA: Diagnosis not present

## 2016-05-18 DIAGNOSIS — F1010 Alcohol abuse, uncomplicated: Secondary | ICD-10-CM

## 2016-05-18 DIAGNOSIS — E46 Unspecified protein-calorie malnutrition: Secondary | ICD-10-CM

## 2016-05-18 DIAGNOSIS — R5381 Other malaise: Secondary | ICD-10-CM | POA: Diagnosis not present

## 2016-05-18 DIAGNOSIS — N400 Enlarged prostate without lower urinary tract symptoms: Secondary | ICD-10-CM

## 2016-05-18 DIAGNOSIS — I10 Essential (primary) hypertension: Secondary | ICD-10-CM | POA: Diagnosis not present

## 2016-05-18 DIAGNOSIS — E785 Hyperlipidemia, unspecified: Secondary | ICD-10-CM | POA: Diagnosis not present

## 2016-05-18 DIAGNOSIS — E784 Other hyperlipidemia: Secondary | ICD-10-CM | POA: Diagnosis not present

## 2016-05-18 LAB — LIPID PANEL
CHOLESTEROL: 101 mg/dL (ref 0–200)
HDL: 38 mg/dL (ref 35–70)
LDL Cholesterol: 50 mg/dL
Triglycerides: 70 mg/dL (ref 40–160)

## 2016-05-18 LAB — CBC AND DIFFERENTIAL
HCT: 35 % — AB (ref 41–53)
HEMOGLOBIN: 11.5 g/dL — AB (ref 13.5–17.5)
Platelets: 307 10*3/uL (ref 150–399)
WBC: 3.4 10^3/mL

## 2016-05-18 LAB — BASIC METABOLIC PANEL
BUN: 11 mg/dL (ref 4–21)
Creatinine: 0.8 mg/dL (ref 0.6–1.3)
GLUCOSE: 89 mg/dL
POTASSIUM: 3.7 mmol/L (ref 3.4–5.3)
SODIUM: 140 mmol/L (ref 137–147)

## 2016-05-18 MED ORDER — LORAZEPAM 1 MG PO TABS: ORAL_TABLET | ORAL | Status: DC

## 2016-05-18 NOTE — Telephone Encounter (Signed)
[facility] 

## 2016-05-18 NOTE — Progress Notes (Signed)
LOCATION: [facility]  PCP: Mayra Neer, MD   Code Status: Full Code  Goals of care: Advanced Directive information Advanced Directives 05/09/2016  Does patient have an advance directive? No  Type of Advance Directive -  Would patient like information on creating an advanced directive? -       Extended Emergency Contact Information Primary Emergency Contact: Tilghman,Michelle Address: [address]          [place], [place] [address] [place] Phone: [phone] Mobile Phone: [phone] Relation: Significant other Secondary Emergency Contact: Ann Maki [place] Mobile Phone: [phone] Relation: Friend   Allergies  Allergen Reactions  . Lisinopril     syncope  . Celebrex [Celecoxib] Rash    Chief Complaint  Patient presents with  . New Admit To SNF    New Admission     HPI:  Patient is a 70 y.o. male seen today for short term rehabilitation post hospital admission from 05/09/16-05/13/16 with alcohol withdrawal. CT head was negative for acute intracranial abnormalities. He had elevated troponin and cardiology was consulted and there was concern for demand ischemia. Medical management was recommended. He has PMH of prostate cancer, CVA, depression, hypertension, insomnia, CAD, alcoholic cirrhosis and recurrent encephalopathy. He is seen in his room today. He has been working with therapy team. He is a high fall risk and has unsteady gait.    Review of Systems:  Constitutional: Negative for fever, chills, diaphoresis. Energy level slowly returning. HENT: Negative for headache, congestion, nasal discharge Eyes: Negative for blurred vision, double vision and discharge. Wears glasses.  Respiratory: Negative for shortness of breath and wheezing. Positive for dry cough.  Cardiovascular: Negative for chest pain, palpitations, leg swelling.  Gastrointestinal: Negative for heartburn, nausea, vomiting, abdominal pain. Last bowel  movement was today. Regular stool.  Genitourinary: Negative for dysuria and flank pain.  Musculoskeletal: Negative for back pain, fall in the facility.  Skin: Negative for itching, rash.  Neurological: Negative for dizziness. Psychiatric/Behavioral: Negative for depression   Past Medical History  Diagnosis Date  . Prostate cancer ([facility]) 2010    External beam radiation (urol - Risa Grill, XRT Valere Dross)  . Stroke ([facility])   . Depression   . Tachycardia   . Hypertension   . Insomnia   . CAD (coronary artery disease), native coronary artery 01/10/2016    3 vessel coronary calcification noted on CT scan 04/03/15    . Fatty liver, alcoholic [id number]  . Acute encephalopathy   . NSTEMI (non-ST elevated myocardial infarction) [facility])    Past Surgical History  Procedure Laterality Date  . None    . Cardiac catheterization N/A 01/15/2016    Procedure: Left Heart Cath and Coronary Angiography;  Surgeon: Burnell Blanks, MD;  Location: [facility];  Service: Cardiovascular;  Laterality: N/A;   Social History:   reports that he has been smoking Cigarettes.  He has a 15 pack-year smoking history. He has never used smokeless tobacco. He reports that he drinks about 33.6 oz of alcohol per week. He reports that he does not use illicit drugs.  Family History  Problem Relation Age of Onset  . Cancer Mother     esophageal  . Hypertension Mother   . Diabetes Mother   . Cancer Father     prostate  . Heart disease Maternal Grandfather     MI  . Cancer Paternal Grandfather     prostate    Medications:   Medication List  This list is accurate as of: 05/18/16  3:23 PM.  Always use your most recent med list.               acetaminophen 325 MG tablet  Commonly known as:  TYLENOL  Take 2 tablets (650 mg total) by mouth every 6 (six) hours as needed for mild pain (or Fever >/= 101).     aspirin 81 MG EC tablet  Take 1 tablet (81 mg total) by mouth daily.     atorvastatin 40 MG  tablet  Commonly known as:  LIPITOR  Take 1 tablet (40 mg total) by mouth daily at 6 PM.     buPROPion 100 MG 12 hr tablet  Commonly known as:  WELLBUTRIN SR  Take 200 mg by mouth daily.     carvedilol 12.5 MG tablet  Commonly known as:  COREG  Take 1 tablet (12.5 mg total) by mouth 2 (two) times daily with a meal.     cholecalciferol 1000 units tablet  Commonly known as:  VITAMIN D  Take 2,000 Units by mouth daily.     citalopram 20 MG tablet  Commonly known as:  CELEXA  Take 20 mg by mouth daily.     Co Q-10 200 MG Caps  Take 200 mg by mouth daily.     Fish Oil 1000 MG Caps  Take 1,000 mg by mouth 2 (two) times daily.     folic acid 1 MG tablet  Commonly known as:  FOLVITE  Take 1 tablet (1 mg total) by mouth daily.     hydrOXYzine 10 MG tablet  Commonly known as:  ATARAX/VISTARIL  Take 1 tablet (10 mg total) by mouth 3 (three) times daily as needed for anxiety.     lactulose 10 GM/15ML solution  Commonly known as:  CHRONULAC  Take 45 mLs (30 g total) by mouth 2 (two) times daily.     LORazepam 0.5 MG tablet  Commonly known as:  ATIVAN  Take 0.5 mg by mouth 2 (two) times daily as needed for anxiety.     Magnesium 500 MG Caps  Take 500 mg by mouth at bedtime.     multivitamin with minerals Tabs tablet  Take 1 tablet by mouth daily.     nitroGLYCERIN 0.4 MG SL tablet  Commonly known as:  NITROSTAT  Place 1 tablet (0.4 mg total) under the tongue every 5 (five) minutes as needed for chest pain.     PROCEL 100 Powd  Take 1 scoop by mouth 2 (two) times daily.     tamsulosin 0.4 MG Caps capsule  Commonly known as:  FLOMAX  Take 0.4 mg by mouth daily.     thiamine 100 MG tablet  Commonly known as:  VITAMIN B-1  Take 100 mg by mouth daily.        Immunizations: Immunization History  Administered Date(s) Administered  . Influenza,inj,Quad PF,36+ Mos 09/04/2014  . PPD Test 04/04/2016  . Pneumococcal-Unspecified 07/01/2014     Physical Exam: Filed  Vitals:   05/18/16 1518  BP: 153/95  Pulse: 71  Temp: 98.7 F (37.1 C)  TempSrc: Oral  Resp: 20  Height: 5\' 10"  (1.778 m)  Weight: 184 lb 6.4 oz (83.643 kg)  SpO2: 98%   Body mass index is 26.46 kg/(m^2).  General- elderly male, well built, in no acute distress Head- normocephalic, atraumatic, has hearing aids Nose- no maxillary or frontal sinus tenderness, no nasal discharge Throat- moist mucus membrane, has dentures Eyes- PERRLA,  EOMI, no pallor, no icterus, no discharge, normal conjunctiva, normal sclera Neck- no cervical lymphadenopathy Cardiovascular- normal s1,s2, no murmur Respiratory- bilateral clear to auscultation, no wheeze, no rhonchi, no crackles, no use of accessory muscles Abdomen- bowel sounds present, soft, non tender Musculoskeletal- able to move all 4 extremities, generalized weakness, ankle edema Neurological- alert and oriented to person, place and time, tremors + Skin- warm and dry, bruising to his face and arms noted Psychiatry- normal mood and affect      Labs reviewed: Basic Metabolic Panel:  Recent Labs  01/18/16 0517 01/19/16 0505  03/24/16 0321  04/04/16 0529  05/09/16 2044 05/10/16 0456 05/11/16 0345  NA 141 141  < > 137  < > 141  < > 139 138 140  K 3.1* 3.6  < > 3.2*  < > 4.1  < > 3.3* 3.4* 3.5  CL 106 107  < > 105  < > 111  --  106 109 107  CO2 25 24  < > 23  < > 22  --  24 23 23   GLUCOSE 111* 107*  < > 129*  < > 102*  --  100* 108* 110*  BUN 10 10  < > 13  < > 13  < > 10 8 <5*  CREATININE 0.87 0.80  < > 0.72  < > 0.87  < > 0.67 0.66 0.66  CALCIUM 8.5* 8.6*  < > 8.6*  < > 9.2  --  9.1 8.3* 9.1  MG 1.8 2.0  < > 1.9  --  1.8  --   --  1.4*  --   PHOS 2.4* 3.1  --   --   --   --   --   --  2.5  --   < > = values in this interval not displayed. Liver Function Tests:  Recent Labs  03/28/16 0526 04/07/16 05/09/16 2044 05/10/16 0456  AST 78* 53* 31 28  ALT 61 67* 20 17  ALKPHOS 86 88 186* 166*  BILITOT 1.4*  --  1.1 1.1  PROT  7.2  --  6.5 5.8*  ALBUMIN 3.7  --  3.4* 3.1*   No results for input(s): LIPASE, AMYLASE in the last 8760 hours.  Recent Labs  03/27/16 1219 05/09/16 2305  AMMONIA 25 43*   CBC:  Recent Labs  03/22/16 0030 03/23/16 1311  04/07/16 05/09/16 2044 05/10/16 0456 05/10/16 0853 05/11/16 0345  WBC 7.3 7.4  < > 6.7 5.7 6.5  --  8.5  NEUTROABS 5.2 5.9  --  4  --   --   --   --   HGB 13.5 11.8*  < > 11.8* 12.7* 12.1*  --  12.2*  HCT 37.5* 32.1*  < > 27* 35.1* 34.5* 36.1* 36.7*  MCV 100.5* 100.9*  < >  --  98.3 100.6*  --  101.7*  PLT 162 141*  < > 288 326 300  --  276  < > = values in this interval not displayed. Cardiac Enzymes:  Recent Labs  01/10/16 1712  01/11/16 0528 05/09/16 2305 05/10/16 0456  CKTOTAL 47*  --  82  --   --   CKMB 1.3  --   --   --   --   TROPONINI 4.59*  < > 4.46* 11.59* 11.85*  < > = values in this interval not displayed. BNP: Invalid input(s): POCBNP CBG:  Recent Labs  05/09/16 2032 05/10/16 1156 05/11/16 1205  GLUCAP 104* 102* 139*  Radiological Exams: Ct Abdomen Pelvis Wo Contrast  05/10/2016  CLINICAL DATA:  Abdominal pain post fall a few days prior EXAM: CT ABDOMEN AND PELVIS WITHOUT CONTRAST TECHNIQUE: Multidetector CT imaging of the abdomen and pelvis was performed following the standard protocol without IV contrast. COMPARISON:  CT 02/16/2016 FINDINGS: Lower chest: Small left pleural effusion, fluid measures simple fluid density. Mild atelectasis in both lower lobes. Liver: Partially obscured by motion.  No evidence of focal lesion. Hepatobiliary: Gallbladder physiologically distended, partially obscured by motion. No calcified stone. Difficult to exclude wall thickening. Pancreas: Partially obscured by motion. No evidence of traumatic injury. Atrophic parenchymal. Spleen: Splenic granuloma. No perisplenic fluid. No evidence of traumatic injury allowing for lack contrast. Adrenal glands: No hemorrhage or nodule. Kidneys: Bilateral  nonspecific perinephric edema, stable from prior. No hydronephrosis. No focal abnormality. Stomach/Bowel: Stomach physiologically distended. There are no dilated or thickened small bowel loops. Small-moderate volume of stool throughout the colon without colonic wall thickening. Distal colonic diverticulosis without diverticulitis. The appendix is normal. Vascular/Lymphatic: No retroperitoneal fluid, bleed, or adenopathy. Abdominal aorta is normal in caliber. Reproductive: Prominent sized prostate gland with mass effect on the bladder base. Brachytherapy seeds. Bladder: Physiologically distended, no wall thickening. Other: No free air, free fluid, or intra-abdominal fluid collection. Fat within both inguinal canals. Tiny fat containing umbilical hernia. Musculoskeletal: Left anterior rib fractures are remote. There are no acute or suspicious osseous abnormalities. Motion artifact limits detailed assessment. IMPRESSION: No acute or traumatic abnormality in the abdomen or pelvis allowing for patient motion and lack of intravenous contrast. Small left pleural effusion measures simple fluid density. Electronically Signed   By: Jeb Levering M.D.   On: 05/10/2016 03:49   Dg Chest 2 View  05/09/2016  CLINICAL DATA:  Fall 5 days prior with altered mental status. EXAM: CHEST  2 VIEW COMPARISON:  Most recent chest radiograph 03/23/2016. Additional priors radiographs reviewed. FINDINGS: The cardiomediastinal contours are unchanged, heart at the upper limits of normal in size. There is thickening of the paratracheal stripes, stable and likely related to overlapping vascular structures. Obscuration of the left hemidiaphragm secondary to small left pleural effusion No pulmonary edema. No pneumothorax. No acute osseous abnormalities are seen. IMPRESSION: Small left pleural effusion. No pneumothorax or evidence of displaced rib fracture. Electronically Signed   By: Jeb Levering M.D.   On: 05/09/2016 21:29   Ct Head Wo  Contrast  05/09/2016  CLINICAL DATA:  Initial evaluation for acute fall 5 days ago. Bruising to left thigh. EXAM: CT HEAD WITHOUT CONTRAST CT MAXILLOFACIAL WITHOUT CONTRAST TECHNIQUE: Multidetector CT imaging of the head and maxillofacial structures were performed using the standard protocol without intravenous contrast. Multiplanar CT image reconstructions of the maxillofacial structures were also generated. COMPARISON:  None. FINDINGS: CT HEAD FINDINGS Diffuse prominence of the CSF containing spaces compatible with generalized cerebral atrophy. Mild to moderate chronic small vessel ischemic disease present. Scattered vascular calcifications within the carotid siphons. No acute intracranial hemorrhage. No acute large vessel territory infarct. No mass lesion, midline shift, or mass effect para ventricular prominence related to global parenchymal volume loss present without hydrocephalus. No extra-axial fluid collection. Left periorbital contusion. Calcification at the anterior aspect of the left globe. Globes themselves grossly intact. No retro-orbital process. Scalp soft tissues otherwise normal. No mastoid effusion. Calvarium intact. CT MAXILLOFACIAL FINDINGS Left periorbital contusion/ laceration. Two adjacent radiopaque calcific densities present anterior to the left globe measuring up to 7 mm (series 5, image 58). These are suspicious for retained debris.  Left globe itself intact. Right globe normal. No retro-orbital hematoma or other pathology. Mild left facial contusion as well. No orbital floor fracture. Small focal old defect in the left lamina papyracea is new relative to prior exam, and likely reflects a small acute fracture (series 9, image 33). Associated 2 mm of displacement. Orbital roofs and lateral orbital walls are intact. Zygomatic arches intact. No acute maxillary fracture. Pterygoid plates intact. No acute nasal bone fracture. Nasal septum intact. Mandible intact. Mandibular condyles mildly  subluxed anteriorly relative to the temporomandibular fossa, likely related to jaw positioning. Poor dentition noted. No acute abnormality about the dentition itself. Chronic bilateral maxillary sinus disease again noted, stable. Minimal mucosal thickening within the ethmoidal air cells. Paranasal sinuses are otherwise clear. Visualized upper cervical spine grossly intact. IMPRESSION: CT HEAD: 1. No acute intracranial process. 2. Stable atrophy with chronic small vessel ischemic disease. CT MAXILLOFACIAL: 1. Left periorbital/facial contusion with left periorbital laceration. Scattered radiopaque densities anterior to the left globe likely reflect retained debris. Globe itself intact with no retro-orbital pathology. 2. Acute fracture of the left lamina papyracea with 2 mm of displacement. While this fracture somewhat age indeterminate in appearance, this is new relative to most recent CT from 03/22/2016, and is suspected to have occurred with this most recent trauma 3. Chronic maxillary sinusitis. Electronically Signed   By: Jeannine Boga M.D.   On: 05/09/2016 22:00   Ct Maxillofacial Wo Cm  05/09/2016  CLINICAL DATA:  Initial evaluation for acute fall 5 days ago. Bruising to left thigh. EXAM: CT HEAD WITHOUT CONTRAST CT MAXILLOFACIAL WITHOUT CONTRAST TECHNIQUE: Multidetector CT imaging of the head and maxillofacial structures were performed using the standard protocol without intravenous contrast. Multiplanar CT image reconstructions of the maxillofacial structures were also generated. COMPARISON:  None. FINDINGS: CT HEAD FINDINGS Diffuse prominence of the CSF containing spaces compatible with generalized cerebral atrophy. Mild to moderate chronic small vessel ischemic disease present. Scattered vascular calcifications within the carotid siphons. No acute intracranial hemorrhage. No acute large vessel territory infarct. No mass lesion, midline shift, or mass effect para ventricular prominence related to  global parenchymal volume loss present without hydrocephalus. No extra-axial fluid collection. Left periorbital contusion. Calcification at the anterior aspect of the left globe. Globes themselves grossly intact. No retro-orbital process. Scalp soft tissues otherwise normal. No mastoid effusion. Calvarium intact. CT MAXILLOFACIAL FINDINGS Left periorbital contusion/ laceration. Two adjacent radiopaque calcific densities present anterior to the left globe measuring up to 7 mm (series 5, image 58). These are suspicious for retained debris. Left globe itself intact. Right globe normal. No retro-orbital hematoma or other pathology. Mild left facial contusion as well. No orbital floor fracture. Small focal old defect in the left lamina papyracea is new relative to prior exam, and likely reflects a small acute fracture (series 9, image 33). Associated 2 mm of displacement. Orbital roofs and lateral orbital walls are intact. Zygomatic arches intact. No acute maxillary fracture. Pterygoid plates intact. No acute nasal bone fracture. Nasal septum intact. Mandible intact. Mandibular condyles mildly subluxed anteriorly relative to the temporomandibular fossa, likely related to jaw positioning. Poor dentition noted. No acute abnormality about the dentition itself. Chronic bilateral maxillary sinus disease again noted, stable. Minimal mucosal thickening within the ethmoidal air cells. Paranasal sinuses are otherwise clear. Visualized upper cervical spine grossly intact. IMPRESSION: CT HEAD: 1. No acute intracranial process. 2. Stable atrophy with chronic small vessel ischemic disease. CT MAXILLOFACIAL: 1. Left periorbital/facial contusion with left periorbital laceration. Scattered radiopaque densities  anterior to the left globe likely reflect retained debris. Globe itself intact with no retro-orbital pathology. 2. Acute fracture of the left lamina papyracea with 2 mm of displacement. While this fracture somewhat age  indeterminate in appearance, this is new relative to most recent CT from 03/22/2016, and is suspected to have occurred with this most recent trauma 3. Chronic maxillary sinusitis. Electronically Signed   By: Jeannine Boga M.D.   On: 05/09/2016 22:00    Assessment/Plan  Physical deconditioning With generalized weakness. Will have him work with physical therapy and occupational therapy team to help with gait training and muscle strengthening exercises.fall precautions. Skin care. Encourage to be out of bed.   Alcohol use No active withdrawal at present. On ativan bid prn. Continue atarax prn and monitor. counselled on cessation. Continue thiamin and folic acid  Elevated ammonia level With alcoholic cirrhosis with encephalopathy in hospital. Currently on lactulose 30 g/ 45 cc bid. change this to 20 gm/ 30 cc bid for now and monitor  Anxiety Continue ativan 0.5 mg bid prn and monitor  Depression Stable mood, continue wellbutrin 200 mg daily and celexa 20 mg daily, monitor  CAD With demand ischemia in hospital. Currently remains chest pain free. Continue aspirin, coreg 12.5 mg bid and lipitor. Continue prn NTG  HTN Elevated bp, monitor BP/HR q shift for now. Continue coreg and adjust dosing if needed    Protein calorie malnutrition Continue MVI with thiamine and folate and continue procel supplement  HLD Continue lipitor 40 mg daily  BPH Continue flomax and monitor    Goals of care: short term rehabilitation   Labs/tests ordered: cbc, cmp  Family/ staff Communication: reviewed care plan with patient and nursing supervisor    Blanchie Serve, MD Internal Medicine [place], [place] [address] Cell Phone (Monday-Friday 8 am - 5 pm): [phone] On Call: [phone] and follow prompts after 5 pm and on weekends Office Phone: [phone] Office Fax: [phone]

## 2016-05-19 DIAGNOSIS — N400 Enlarged prostate without lower urinary tract symptoms: Secondary | ICD-10-CM | POA: Diagnosis not present

## 2016-05-19 DIAGNOSIS — R531 Weakness: Secondary | ICD-10-CM | POA: Diagnosis not present

## 2016-05-19 DIAGNOSIS — E785 Hyperlipidemia, unspecified: Secondary | ICD-10-CM | POA: Diagnosis not present

## 2016-05-19 DIAGNOSIS — K709 Alcoholic liver disease, unspecified: Secondary | ICD-10-CM | POA: Diagnosis not present

## 2016-05-19 DIAGNOSIS — F10230 Alcohol dependence with withdrawal, uncomplicated: Secondary | ICD-10-CM | POA: Diagnosis not present

## 2016-05-19 DIAGNOSIS — F329 Major depressive disorder, single episode, unspecified: Secondary | ICD-10-CM | POA: Diagnosis not present

## 2016-05-19 DIAGNOSIS — R7989 Other specified abnormal findings of blood chemistry: Secondary | ICD-10-CM | POA: Diagnosis not present

## 2016-05-19 DIAGNOSIS — K7030 Alcoholic cirrhosis of liver without ascites: Secondary | ICD-10-CM | POA: Diagnosis not present

## 2016-05-19 DIAGNOSIS — K7200 Acute and subacute hepatic failure without coma: Secondary | ICD-10-CM | POA: Diagnosis not present

## 2016-05-19 DIAGNOSIS — I10 Essential (primary) hypertension: Secondary | ICD-10-CM | POA: Diagnosis not present

## 2016-05-19 DIAGNOSIS — Z5189 Encounter for other specified aftercare: Secondary | ICD-10-CM | POA: Diagnosis not present

## 2016-05-19 DIAGNOSIS — R488 Other symbolic dysfunctions: Secondary | ICD-10-CM | POA: Diagnosis not present

## 2016-05-19 DIAGNOSIS — M10029 Idiopathic gout, unspecified elbow: Secondary | ICD-10-CM | POA: Diagnosis not present

## 2016-05-19 DIAGNOSIS — M6281 Muscle weakness (generalized): Secondary | ICD-10-CM | POA: Diagnosis not present

## 2016-05-19 DIAGNOSIS — I2510 Atherosclerotic heart disease of native coronary artery without angina pectoris: Secondary | ICD-10-CM | POA: Diagnosis not present

## 2016-05-19 DIAGNOSIS — N179 Acute kidney failure, unspecified: Secondary | ICD-10-CM | POA: Diagnosis not present

## 2016-05-19 DIAGNOSIS — E46 Unspecified protein-calorie malnutrition: Secondary | ICD-10-CM | POA: Diagnosis not present

## 2016-05-19 DIAGNOSIS — F411 Generalized anxiety disorder: Secondary | ICD-10-CM | POA: Diagnosis not present

## 2016-05-19 DIAGNOSIS — R2681 Unsteadiness on feet: Secondary | ICD-10-CM | POA: Diagnosis not present

## 2016-05-20 ENCOUNTER — Non-Acute Institutional Stay (SKILLED_NURSING_FACILITY): Payer: Medicare Other | Admitting: Internal Medicine

## 2016-05-20 ENCOUNTER — Encounter: Payer: Self-pay | Admitting: Internal Medicine

## 2016-05-20 DIAGNOSIS — K7682 Hepatic encephalopathy: Secondary | ICD-10-CM

## 2016-05-20 DIAGNOSIS — K7030 Alcoholic cirrhosis of liver without ascites: Secondary | ICD-10-CM

## 2016-05-20 DIAGNOSIS — F411 Generalized anxiety disorder: Secondary | ICD-10-CM

## 2016-05-20 DIAGNOSIS — R7989 Other specified abnormal findings of blood chemistry: Secondary | ICD-10-CM

## 2016-05-20 DIAGNOSIS — K7200 Acute and subacute hepatic failure without coma: Secondary | ICD-10-CM

## 2016-05-20 LAB — HEPATIC FUNCTION PANEL
ALK PHOS: 117 U/L (ref 25–125)
ALT: 25 U/L (ref 10–40)
AST: 24 U/L (ref 14–40)
Bilirubin, Total: 0.6 mg/dL

## 2016-05-20 LAB — BASIC METABOLIC PANEL
BUN: 12 mg/dL (ref 4–21)
CREATININE: 1 mg/dL (ref 0.6–1.3)
GLUCOSE: 117 mg/dL
Potassium: 4.3 mmol/L (ref 3.4–5.3)
SODIUM: 140 mmol/L (ref 137–147)

## 2016-05-20 LAB — CBC AND DIFFERENTIAL
HCT: 36 % — AB (ref 41–53)
Hemoglobin: 12.2 g/dL — AB (ref 13.5–17.5)
Neutrophils Absolute: 4440 /uL
PLATELETS: 392 10*3/uL (ref 150–399)
WBC: 7.4 10^3/mL

## 2016-05-20 NOTE — Progress Notes (Signed)
LOCATION: [facility]  PCP: Mayra Neer, MD   Code Status: Full Code  Goals of care: Advanced Directive information Advanced Directives 05/09/2016  Does patient have an advance directive? No  Type of Advance Directive -  Would patient like information on creating an advanced directive? -       Extended Emergency Contact Information Primary Emergency Contact: Tilghman,Michelle Address: [address]          [place], [place] [address] [place] Phone: [phone] Mobile Phone: [phone] Relation: Significant other Secondary Emergency Contact: Ann Maki [place] Mobile Phone: [phone] Relation: Friend   Allergies  Allergen Reactions  . Lisinopril     syncope  . Celebrex [Celecoxib] Rash    Chief Complaint  Patient presents with  . Acute Visit    Confusion     HPI:  Patient is a 70 y.o. male seen today for acute visit. He has been confused since this am and has made attempts to elope the building. He does not recognize his girlfriend who is at bedside at present. He participates in conversation but seems to confabulate. Of note, he is here for short term rehabilitation post hospital admission from 05/09/16-05/13/16 with alcohol withdrawal. He has history pf alcoholic cirrhosis and recurrent encephalopathy.     Review of Systems:  Constitutional: Negative for fever, chills, diaphoresis.  HENT: Negative for headache, congestion, nasal discharge Eyes: Negative for blurred vision, double vision and discharge. Wears glasses.  Respiratory: Negative for shortness of breath and wheezing. Positive for dry cough.  Cardiovascular: Negative for chest pain, palpitations, leg swelling.  Gastrointestinal: Negative for heartburn, nausea, vomiting, abdominal pain. Last bowel movement was 2 days back, Genitourinary: Negative for dysuria and flank pain.  Musculoskeletal: Negative for back pain, fall in the facility.  Skin: Negative  for itching, rash.  Neurological: Negative for dizziness.   Past Medical History  Diagnosis Date  . Prostate cancer ([facility]) 2010    External beam radiation (urol - Risa Grill, XRT Valere Dross)  . Stroke ([facility])   . Depression   . Tachycardia   . Hypertension   . Insomnia   . CAD (coronary artery disease), native coronary artery 01/10/2016    3 vessel coronary calcification noted on CT scan 04/03/15    . Fatty liver, alcoholic [id number]  . Acute encephalopathy   . NSTEMI (non-ST elevated myocardial infarction) [facility])    Past Surgical History  Procedure Laterality Date  . None    . Cardiac catheterization N/A 01/15/2016    Procedure: Left Heart Cath and Coronary Angiography;  Surgeon: Burnell Blanks, MD;  Location: [facility];  Service: Cardiovascular;  Laterality: N/A;     Medications:   Medication List       This list is accurate as of: 05/20/16  1:37 PM.  Always use your most recent med list.               acetaminophen 325 MG tablet  Commonly known as:  TYLENOL  Take 2 tablets (650 mg total) by mouth every 6 (six) hours as needed for mild pain (or Fever >/= 101).     aspirin 81 MG EC tablet  Take 1 tablet (81 mg total) by mouth daily.     atorvastatin 40 MG tablet  Commonly known as:  LIPITOR  Take 1 tablet (40 mg total) by mouth daily at 6 PM.     buPROPion 100 MG 12 hr tablet  Commonly known as:  WELLBUTRIN SR  Take 200 mg by mouth daily.     carvedilol 12.5 MG tablet  Commonly known as:  COREG  Take 1 tablet (12.5 mg total) by mouth 2 (two) times daily with a meal.     cholecalciferol 1000 units tablet  Commonly known as:  VITAMIN D  Take 2,000 Units by mouth daily.     citalopram 20 MG tablet  Commonly known as:  CELEXA  Take 20 mg by mouth daily.     Co Q-10 200 MG Caps  Take 200 mg by mouth daily.     Fish Oil 1000 MG Caps  Take 1,000 mg by mouth 2 (two) times daily.     folic acid 1 MG tablet  Commonly known as:  FOLVITE  Take 1 tablet  (1 mg total) by mouth daily.     hydrOXYzine 10 MG tablet  Commonly known as:  ATARAX/VISTARIL  Take 1 tablet (10 mg total) by mouth 3 (three) times daily as needed for anxiety.     lactulose 10 GM/15ML solution  Commonly known as:  CHRONULAC  Take by mouth 2 (two) times daily. Give 30 mL     LORazepam 0.5 MG tablet  Commonly known as:  ATIVAN  Take 0.5 mg by mouth 2 (two) times daily as needed for anxiety.     Magnesium 500 MG Caps  Take 500 mg by mouth at bedtime.     multivitamin with minerals Tabs tablet  Take 1 tablet by mouth daily.     nitroGLYCERIN 0.4 MG SL tablet  Commonly known as:  NITROSTAT  Place 1 tablet (0.4 mg total) under the tongue every 5 (five) minutes as needed for chest pain.     PROCEL 100 Powd  Take 1 scoop by mouth 2 (two) times daily.     tamsulosin 0.4 MG Caps capsule  Commonly known as:  FLOMAX  Take 0.4 mg by mouth daily.     thiamine 100 MG tablet  Commonly known as:  VITAMIN B-1  Take 100 mg by mouth daily.        Immunizations: Immunization History  Administered Date(s) Administered  . Influenza,inj,Quad PF,36+ Mos 09/04/2014  . PPD Test 04/04/2016  . Pneumococcal-Unspecified 07/01/2014     Physical Exam: Filed Vitals:   05/20/16 1330  BP: 132/68  Pulse: 78  Temp: 98.2 F (36.8 C)  TempSrc: Oral  Resp: 18  Height: 5\' 10"  (1.778 m)  Weight: 184 lb 6.4 oz (83.643 kg)   Body mass index is 26.46 kg/(m^2).  General- elderly male, well built, in no acute distress Head- normocephalic, atraumatic, has hearing aids Nose- no maxillary or frontal sinus tenderness, no nasal discharge Throat- moist mucus membrane, has dentures Eyes- PERRLA, EOMI, no pallor, no icterus, no discharge, normal conjunctiva, normal sclera Neck- no cervical lymphadenopathy Cardiovascular- normal s1,s2, no murmur Respiratory- bilateral clear to auscultation, no wheeze, no rhonchi, no crackles, no use of accessory muscles Abdomen- bowel sounds present,  soft, non tender Musculoskeletal- able to move all 4 extremities, generalized weakness, ankle edema +, has mild asterexis Neurological- alert and oriented to self Skin- warm and dry, bruising to his face and arms noted Psychiatry- confused and somewhat agitated      Labs reviewed: Basic Metabolic Panel:  Recent Labs  01/18/16 0517 01/19/16 0505  03/24/16 0321  04/04/16 0529  05/09/16 2044 05/10/16 0456 05/11/16 0345 05/18/16  NA 141 141  < > 137  < > 141  < > 139 138 140  140  K 3.1* 3.6  < > 3.2*  < > 4.1  < > 3.3* 3.4* 3.5 3.7  CL 106 107  < > 105  < > 111  --  106 109 107  --   CO2 25 24  < > 23  < > 22  --  24 23 23   --   GLUCOSE 111* 107*  < > 129*  < > 102*  --  100* 108* 110*  --   BUN 10 10  < > 13  < > 13  < > 10 8 <5* 11  CREATININE 0.87 0.80  < > 0.72  < > 0.87  < > 0.67 0.66 0.66 0.8  CALCIUM 8.5* 8.6*  < > 8.6*  < > 9.2  --  9.1 8.3* 9.1  --   MG 1.8 2.0  < > 1.9  --  1.8  --   --  1.4*  --   --   PHOS 2.4* 3.1  --   --   --   --   --   --  2.5  --   --   < > = values in this interval not displayed. Liver Function Tests:  Recent Labs  03/28/16 0526 04/07/16 05/09/16 2044 05/10/16 0456  AST 78* 53* 31 28  ALT 61 67* 20 17  ALKPHOS 86 88 186* 166*  BILITOT 1.4*  --  1.1 1.1  PROT 7.2  --  6.5 5.8*  ALBUMIN 3.7  --  3.4* 3.1*   No results for input(s): LIPASE, AMYLASE in the last 8760 hours.  Recent Labs  03/27/16 1219 05/09/16 2305  AMMONIA 25 43*   CBC:  Recent Labs  03/22/16 0030 03/23/16 1311  04/07/16 05/09/16 2044 05/10/16 0456 05/10/16 0853 05/11/16 0345 05/18/16  WBC 7.3 7.4  < > 6.7 5.7 6.5  --  8.5 3.4  NEUTROABS 5.2 5.9  --  4  --   --   --   --   --   HGB 13.5 11.8*  < > 11.8* 12.7* 12.1*  --  12.2* 11.5*  HCT 37.5* 32.1*  < > 27* 35.1* 34.5* 36.1* 36.7* 35*  MCV 100.5* 100.9*  < >  --  98.3 100.6*  --  101.7*  --   PLT 162 141*  < > 288 326 300  --  276 307  < > = values in this interval not displayed. Cardiac  Enzymes:  Recent Labs  01/10/16 1712  01/11/16 0528 05/09/16 2305 05/10/16 0456  CKTOTAL 47*  --  82  --   --   CKMB 1.3  --   --   --   --   TROPONINI 4.59*  < > 4.46* 11.59* 11.85*  < > = values in this interval not displayed. BNP: Invalid input(s): POCBNP CBG:  Recent Labs  05/09/16 2032 05/10/16 1156 05/11/16 1205  GLUCAP 104* 102* 139*    Radiological Exams: Ct Abdomen Pelvis Wo Contrast  05/10/2016  CLINICAL DATA:  Abdominal pain post fall a few days prior EXAM: CT ABDOMEN AND PELVIS WITHOUT CONTRAST TECHNIQUE: Multidetector CT imaging of the abdomen and pelvis was performed following the standard protocol without IV contrast. COMPARISON:  CT 02/16/2016 FINDINGS: Lower chest: Small left pleural effusion, fluid measures simple fluid density. Mild atelectasis in both lower lobes. Liver: Partially obscured by motion.  No evidence of focal lesion. Hepatobiliary: Gallbladder physiologically distended, partially obscured by motion. No calcified stone. Difficult to exclude wall thickening.  Pancreas: Partially obscured by motion. No evidence of traumatic injury. Atrophic parenchymal. Spleen: Splenic granuloma. No perisplenic fluid. No evidence of traumatic injury allowing for lack contrast. Adrenal glands: No hemorrhage or nodule. Kidneys: Bilateral nonspecific perinephric edema, stable from prior. No hydronephrosis. No focal abnormality. Stomach/Bowel: Stomach physiologically distended. There are no dilated or thickened small bowel loops. Small-moderate volume of stool throughout the colon without colonic wall thickening. Distal colonic diverticulosis without diverticulitis. The appendix is normal. Vascular/Lymphatic: No retroperitoneal fluid, bleed, or adenopathy. Abdominal aorta is normal in caliber. Reproductive: Prominent sized prostate gland with mass effect on the bladder base. Brachytherapy seeds. Bladder: Physiologically distended, no wall thickening. Other: No free air, free fluid,  or intra-abdominal fluid collection. Fat within both inguinal canals. Tiny fat containing umbilical hernia. Musculoskeletal: Left anterior rib fractures are remote. There are no acute or suspicious osseous abnormalities. Motion artifact limits detailed assessment. IMPRESSION: No acute or traumatic abnormality in the abdomen or pelvis allowing for patient motion and lack of intravenous contrast. Small left pleural effusion measures simple fluid density. Electronically Signed   By: Jeb Levering M.D.   On: 05/10/2016 03:49   Dg Chest 2 View  05/09/2016  CLINICAL DATA:  Fall 5 days prior with altered mental status. EXAM: CHEST  2 VIEW COMPARISON:  Most recent chest radiograph 03/23/2016. Additional priors radiographs reviewed. FINDINGS: The cardiomediastinal contours are unchanged, heart at the upper limits of normal in size. There is thickening of the paratracheal stripes, stable and likely related to overlapping vascular structures. Obscuration of the left hemidiaphragm secondary to small left pleural effusion No pulmonary edema. No pneumothorax. No acute osseous abnormalities are seen. IMPRESSION: Small left pleural effusion. No pneumothorax or evidence of displaced rib fracture. Electronically Signed   By: Jeb Levering M.D.   On: 05/09/2016 21:29   Ct Head Wo Contrast  05/09/2016  CLINICAL DATA:  Initial evaluation for acute fall 5 days ago. Bruising to left thigh. EXAM: CT HEAD WITHOUT CONTRAST CT MAXILLOFACIAL WITHOUT CONTRAST TECHNIQUE: Multidetector CT imaging of the head and maxillofacial structures were performed using the standard protocol without intravenous contrast. Multiplanar CT image reconstructions of the maxillofacial structures were also generated. COMPARISON:  None. FINDINGS: CT HEAD FINDINGS Diffuse prominence of the CSF containing spaces compatible with generalized cerebral atrophy. Mild to moderate chronic small vessel ischemic disease present. Scattered vascular calcifications  within the carotid siphons. No acute intracranial hemorrhage. No acute large vessel territory infarct. No mass lesion, midline shift, or mass effect para ventricular prominence related to global parenchymal volume loss present without hydrocephalus. No extra-axial fluid collection. Left periorbital contusion. Calcification at the anterior aspect of the left globe. Globes themselves grossly intact. No retro-orbital process. Scalp soft tissues otherwise normal. No mastoid effusion. Calvarium intact. CT MAXILLOFACIAL FINDINGS Left periorbital contusion/ laceration. Two adjacent radiopaque calcific densities present anterior to the left globe measuring up to 7 mm (series 5, image 58). These are suspicious for retained debris. Left globe itself intact. Right globe normal. No retro-orbital hematoma or other pathology. Mild left facial contusion as well. No orbital floor fracture. Small focal old defect in the left lamina papyracea is new relative to prior exam, and likely reflects a small acute fracture (series 9, image 33). Associated 2 mm of displacement. Orbital roofs and lateral orbital walls are intact. Zygomatic arches intact. No acute maxillary fracture. Pterygoid plates intact. No acute nasal bone fracture. Nasal septum intact. Mandible intact. Mandibular condyles mildly subluxed anteriorly relative to the temporomandibular fossa, likely related to  jaw positioning. Poor dentition noted. No acute abnormality about the dentition itself. Chronic bilateral maxillary sinus disease again noted, stable. Minimal mucosal thickening within the ethmoidal air cells. Paranasal sinuses are otherwise clear. Visualized upper cervical spine grossly intact. IMPRESSION: CT HEAD: 1. No acute intracranial process. 2. Stable atrophy with chronic small vessel ischemic disease. CT MAXILLOFACIAL: 1. Left periorbital/facial contusion with left periorbital laceration. Scattered radiopaque densities anterior to the left globe likely reflect  retained debris. Globe itself intact with no retro-orbital pathology. 2. Acute fracture of the left lamina papyracea with 2 mm of displacement. While this fracture somewhat age indeterminate in appearance, this is new relative to most recent CT from 03/22/2016, and is suspected to have occurred with this most recent trauma 3. Chronic maxillary sinusitis. Electronically Signed   By: Jeannine Boga M.D.   On: 05/09/2016 22:00   Ct Maxillofacial Wo Cm  05/09/2016  CLINICAL DATA:  Initial evaluation for acute fall 5 days ago. Bruising to left thigh. EXAM: CT HEAD WITHOUT CONTRAST CT MAXILLOFACIAL WITHOUT CONTRAST TECHNIQUE: Multidetector CT imaging of the head and maxillofacial structures were performed using the standard protocol without intravenous contrast. Multiplanar CT image reconstructions of the maxillofacial structures were also generated. COMPARISON:  None. FINDINGS: CT HEAD FINDINGS Diffuse prominence of the CSF containing spaces compatible with generalized cerebral atrophy. Mild to moderate chronic small vessel ischemic disease present. Scattered vascular calcifications within the carotid siphons. No acute intracranial hemorrhage. No acute large vessel territory infarct. No mass lesion, midline shift, or mass effect para ventricular prominence related to global parenchymal volume loss present without hydrocephalus. No extra-axial fluid collection. Left periorbital contusion. Calcification at the anterior aspect of the left globe. Globes themselves grossly intact. No retro-orbital process. Scalp soft tissues otherwise normal. No mastoid effusion. Calvarium intact. CT MAXILLOFACIAL FINDINGS Left periorbital contusion/ laceration. Two adjacent radiopaque calcific densities present anterior to the left globe measuring up to 7 mm (series 5, image 58). These are suspicious for retained debris. Left globe itself intact. Right globe normal. No retro-orbital hematoma or other pathology. Mild left facial  contusion as well. No orbital floor fracture. Small focal old defect in the left lamina papyracea is new relative to prior exam, and likely reflects a small acute fracture (series 9, image 33). Associated 2 mm of displacement. Orbital roofs and lateral orbital walls are intact. Zygomatic arches intact. No acute maxillary fracture. Pterygoid plates intact. No acute nasal bone fracture. Nasal septum intact. Mandible intact. Mandibular condyles mildly subluxed anteriorly relative to the temporomandibular fossa, likely related to jaw positioning. Poor dentition noted. No acute abnormality about the dentition itself. Chronic bilateral maxillary sinus disease again noted, stable. Minimal mucosal thickening within the ethmoidal air cells. Paranasal sinuses are otherwise clear. Visualized upper cervical spine grossly intact. IMPRESSION: CT HEAD: 1. No acute intracranial process. 2. Stable atrophy with chronic small vessel ischemic disease. CT MAXILLOFACIAL: 1. Left periorbital/facial contusion with left periorbital laceration. Scattered radiopaque densities anterior to the left globe likely reflect retained debris. Globe itself intact with no retro-orbital pathology. 2. Acute fracture of the left lamina papyracea with 2 mm of displacement. While this fracture somewhat age indeterminate in appearance, this is new relative to most recent CT from 03/22/2016, and is suspected to have occurred with this most recent trauma 3. Chronic maxillary sinusitis. Electronically Signed   By: Jeannine Boga M.D.   On: 05/09/2016 22:00    Assessment/Plan  Acute encephalopathy Has alcoholic liver cirrhosis and lab work shows elevated ammonia level in  80s. Has not had a bowel movement in 2 days. Will change his lactulose to 45 ml/30 g tid for now to help have at least 3 bowel movement a day. Check cbc with diff and cmp to assess for infectious etiology. Continue ativan bid prn for agitation. He is a high fall risk and have  suggested family for 1:1 sitter for now. Send u/a to rule out uti  Alcoholic liver cirrhosis With encephalopathy. No active withdrawal at present. On ativan bid prn. Continue atarax prn and monitor. Continue thiamine and folic acid.  Elevated ammonia level With alcoholic cirrhosis with encephalopathy. change lactulose to 45 ml/30 gm tid for now.   Anxiety Continue ativan 0.5 mg bid prn and monitor. Avoid excessive benzodiazepine to avoid fall risk with his unsteady gait   Labs/tests ordered: stat cbc, cmp, ammonia level, u/a with c/s  Family/ staff Communication: reviewed care plan with patient and nursing supervisor    Blanchie Serve, MD Internal [facility] [place], [place] [address] Cell Phone (Monday-Friday 8 am - 5 pm): [phone] On Call: [phone] and follow prompts after 5 pm and on weekends Office Phone: [phone] Office Fax: [phone]

## 2016-05-26 ENCOUNTER — Encounter: Payer: Self-pay | Admitting: Adult Health

## 2016-05-26 ENCOUNTER — Non-Acute Institutional Stay (SKILLED_NURSING_FACILITY): Payer: Medicare Other | Admitting: Adult Health

## 2016-05-26 DIAGNOSIS — F10230 Alcohol dependence with withdrawal, uncomplicated: Secondary | ICD-10-CM | POA: Diagnosis not present

## 2016-05-26 DIAGNOSIS — R531 Weakness: Secondary | ICD-10-CM | POA: Diagnosis not present

## 2016-05-26 DIAGNOSIS — E46 Unspecified protein-calorie malnutrition: Secondary | ICD-10-CM

## 2016-05-26 DIAGNOSIS — M109 Gout, unspecified: Secondary | ICD-10-CM

## 2016-05-26 DIAGNOSIS — F10930 Alcohol use, unspecified with withdrawal, uncomplicated: Secondary | ICD-10-CM

## 2016-05-26 DIAGNOSIS — I10 Essential (primary) hypertension: Secondary | ICD-10-CM

## 2016-05-26 DIAGNOSIS — F329 Major depressive disorder, single episode, unspecified: Secondary | ICD-10-CM | POA: Diagnosis not present

## 2016-05-26 DIAGNOSIS — N400 Enlarged prostate without lower urinary tract symptoms: Secondary | ICD-10-CM

## 2016-05-26 DIAGNOSIS — M10029 Idiopathic gout, unspecified elbow: Secondary | ICD-10-CM | POA: Diagnosis not present

## 2016-05-26 DIAGNOSIS — F32A Depression, unspecified: Secondary | ICD-10-CM

## 2016-05-26 DIAGNOSIS — I2510 Atherosclerotic heart disease of native coronary artery without angina pectoris: Secondary | ICD-10-CM

## 2016-05-26 NOTE — Progress Notes (Signed)
Patient ID: Joshua Henson, male   DOB: 02/22/1946, 70 y.o.   MRN: [id number]    DATE:  05/26/2016   MRN:  [id number]  BIRTHDAY: [birth year]  Facility:  [facility]ursing Home Location:  [place] Room Number: 1204-P  LEVEL OF CARE:  SNF (31)  Contact Information    Name Relation Home Work [place] Significant other [place]   [phone]   Crossett,Alexandra Daughter [phone]         Code Status History    Date Active Date Inactive Code Status Order ID Comments User Context   05/10/2016  1:49 AM 05/13/2016  4:51 PM Full Code [id number]  Toy Baker, MD ED   03/23/2016  4:48 PM 04/04/2016  4:36 PM Full Code [id number]  Cristal Ford, DO Inpatient   01/18/2016  9:25 AM 01/19/2016  9:46 PM Full Code [id number]  Cherene Altes, MD Inpatient   01/15/2016  3:37 PM 01/18/2016  9:25 AM Full Code [id number]  Burnell Blanks, MD Inpatient   01/10/2016  9:58 PM 01/15/2016  3:37 PM Full Code [id number]  Norval Morton, MD Inpatient   04/02/2015  7:29 PM 04/08/2015  7:38 PM Full Code [id number]  Deneise Lever, MD ED   12/09/2014 10:27 PM 12/11/2014 12:14 PM Full Code [id number]  Evelina Bucy, MD ED   12/09/2014  4:22 PM 12/09/2014 10:27 PM Full Code [id number]  Charlott Rakes, MD ED   09/03/2014  1:19 AM 09/12/2014  3:50 PM Full Code [id number]  Allie Bossier, MD ED   07/07/2014  4:31 PM 07/11/2014  5:39 PM Full Code [id number]  Cathlyn Parsons, PA-C Inpatient   07/02/2014  3:38 AM 07/07/2014  4:31 PM Full Code [id number]  Theressa Millard, MD Inpatient       Chief Complaint  Patient presents with  . Discharge Note    HISTORY OF PRESENT ILLNESS:  This is a 70 year old male who is for discharge home with Home health OT, PT and SW.   He has been admitted to [facility] on 05/13/16 from [place] has PMH of prostate cancer, CVA, depression, hypertension, insomnia, CAD, alcoholic cirrhosis and  recurrent acute encephalopathy. He was having acute mental status changes and was brought to the hospital. Wife was concerned that he was drinking more than usual. Patient has a known history of heavy alcohol abuse. He was noted to have elevated troponin of 11.59, urine rapid drug screen positive for opiates and benzodiazepines, alcohol level of 9 (elevated), CT head negative for hemorrhage and CXR positive for small left pleural effusion.  Cardiology was consulted and he was started on heparin drip. It was felt that troponin was elevated due to demand ischemia in setting of known CAD by [facility] January 2017 w/ 60-70% stenosis of mid segment of the non dominant RCA.  He has complained of pain on bilateral elbows. He can do active ROM of BUE but complained of tenderness on bilateral elbows. Uric acid level was 9.0  - elevated.  Patient was admitted to this facility for short-term rehabilitation after the patient's recent hospitalization.  Patient has completed SNF rehabilitation and therapy has cleared the patient for discharge.  PAST MEDICAL HISTORY:  Past Medical History  Diagnosis Date  . Prostate cancer ([place]) 2010    External beam radiation (urol - Risa Grill, XRT Valere Dross)  . Stroke ([place])   . Depression   . Tachycardia   .  Hypertension   . Insomnia   . CAD (coronary artery disease), native coronary artery 01/10/2016    3 vessel coronary calcification noted on CT scan 04/03/15    . Fatty liver, alcoholic [id number]  . NSTEMI (non-ST elevated myocardial infarction) ([place])   . Acute hepatic encephalopathy ([place])   . Alcoholic cirrhosis of liver without ascites ([place])   . Increased ammonia level   . Anxiety state      CURRENT MEDICATIONS: Reviewed  Patient's Medications  New Prescriptions   No medications on file  Previous Medications   ACETAMINOPHEN (TYLENOL) 325 MG TABLET    Take 2 tablets (650 mg total) by mouth every 6 (six) hours as needed for mild pain (or Fever >/= 101).   ASPIRIN 81 MG EC  TABLET    Take 1 tablet (81 mg total) by mouth daily.   ATORVASTATIN (LIPITOR) 40 MG TABLET    Take 1 tablet (40 mg total) by mouth daily at 6 PM.   BUPROPION (WELLBUTRIN SR) 100 MG 12 HR TABLET    Take 200 mg by mouth daily.    CARVEDILOL (COREG) 12.5 MG TABLET    Take 1 tablet (12.5 mg total) by mouth 2 (two) times daily with a meal.   CHOLECALCIFEROL (VITAMIN D) 1000 UNITS TABLET    Take 2,000 Units by mouth daily.   CITALOPRAM (CELEXA) 20 MG TABLET    Take 20 mg by mouth daily.   COENZYME Q10 (CO Q-10) 200 MG CAPS    Take 200 mg by mouth daily.   FOLIC ACID (FOLVITE) 1 MG TABLET    Take 1 tablet (1 mg total) by mouth daily.   HYDROXYZINE (ATARAX/VISTARIL) 10 MG TABLET    Take 1 tablet (10 mg total) by mouth 3 (three) times daily as needed for anxiety.   LACTULOSE (CHRONULAC) 10 GM/15ML SOLUTION    Take by mouth 3 (three) times daily. Give 45 mL. Hold if having 3 BMs/day   LORAZEPAM (ATIVAN) 0.5 MG TABLET    Take 0.5 mg by mouth 2 (two) times daily as needed for anxiety.   MAGNESIUM 500 MG CAPS    Take 500 mg by mouth at bedtime.   MULTIPLE VITAMIN (MULTIVITAMIN WITH MINERALS) TABS TABLET    Take 1 tablet by mouth daily.   NITROGLYCERIN (NITROSTAT) 0.4 MG SL TABLET    Place 1 tablet (0.4 mg total) under the tongue every 5 (five) minutes as needed for chest pain.   OMEGA-3 FATTY ACIDS (FISH OIL) 1000 MG CAPS    Take 1,000 mg by mouth 2 (two) times daily.    PROTEIN (PROCEL 100) POWD    Take 1 scoop by mouth 2 (two) times daily.   TAMSULOSIN (FLOMAX) 0.4 MG CAPS CAPSULE    Take 0.4 mg by mouth daily.    THIAMINE (VITAMIN B-1) 100 MG TABLET    Take 100 mg by mouth daily.  Modified Medications   No medications on file  Discontinued Medications   No medications on file     Allergies  Allergen Reactions  . Lisinopril     syncope  . Celebrex [Celecoxib] Rash     REVIEW OF SYSTEMS:  GENERAL: no change in appetite, no fatigue, no weight changes, no fever, chills or weakness EYES: Denies  change in vision, dry eyes, eye pain, itching or discharge EARS: Denies change in hearing, ringing in ears, or earache NOSE: Denies nasal congestion or epistaxis MOUTH and THROAT: Denies oral discomfort, gingival pain or bleeding, pain from  teeth or hoarseness   RESPIRATORY: no cough, SOB, DOE, wheezing, hemoptysis CARDIAC: no chest pain, edema or palpitations GI: no abdominal pain, diarrhea, constipation, heart burn, nausea or vomiting GU: Denies dysuria, frequency, hematuria, incontinence, or discharge PSYCHIATRIC: Denies feeling of depression. No report of hallucinations, insomnia, paranoia, or agitation; +anxiety   PHYSICAL EXAMINATION  GENERAL APPEARANCE: Well nourished. In no acute distress. Normal body habitus SKIN:  Skin is warm and dry. HEAD: Normal in size and contour. No evidence of trauma EYES: Lids open and close normally. No blepharitis, entropion or ectropion. PERRL. Conjunctivae are clear and sclerae are white. Lenses are without opacity EARS: Pinnae are normal. Patient hears normal voice tunes of the examiner MOUTH and THROAT: Lips are without lesions. Oral mucosa is moist and without lesions. Tongue is normal in shape, size, and color and without lesions NECK: supple, trachea midline, no neck masses, no thyroid tenderness, no thyromegaly LYMPHATICS: no LAN in the neck, no supraclavicular LAN RESPIRATORY: breathing is even & unlabored, BS CTAB CARDIAC: RRR, no murmur,no extra heart sounds, no edema GI: abdomen soft, normal BS, no masses, no tenderness, no hepatomegaly, no splenomegaly EXTREMITIES:  Able to move 4 extremities; tender on bilateral elbows PSYCHIATRIC: Alert and oriented X 3. Anxious and behavior are appropriate  LABS/RADIOLOGY: Labs reviewed: 05/25/16   Uric acid 9.0 Basic Metabolic Panel:  Recent Labs  01/18/16 0517 01/19/16 0505  03/24/16 0321  04/04/16 0529  05/09/16 2044 05/10/16 0456 05/11/16 0345 05/18/16 05/20/16  NA 141 141  < > 137  < >  141  < > 139 138 140 140 140  K 3.1* 3.6  < > 3.2*  < > 4.1  < > 3.3* 3.4* 3.5 3.7 4.3  CL 106 107  < > 105  < > 111  --  106 109 107  --   --   CO2 25 24  < > 23  < > 22  --  24 23 23   --   --   GLUCOSE 111* 107*  < > 129*  < > 102*  --  100* 108* 110*  --   --   BUN 10 10  < > 13  < > 13  < > 10 8 <5* 11 12  CREATININE 0.87 0.80  < > 0.72  < > 0.87  < > 0.67 0.66 0.66 0.8 1.0  CALCIUM 8.5* 8.6*  < > 8.6*  < > 9.2  --  9.1 8.3* 9.1  --   --   MG 1.8 2.0  < > 1.9  --  1.8  --   --  1.4*  --   --   --   PHOS 2.4* 3.1  --   --   --   --   --   --  2.5  --   --   --   < > = values in this interval not displayed. Liver Function Tests:  Recent Labs  03/28/16 0526  05/09/16 2044 05/10/16 0456 05/20/16  AST 78*  < > 31 28 24   ALT 61  < > 20 17 25   ALKPHOS 86  < > 186* 166* 117  BILITOT 1.4*  --  1.1 1.1  --   PROT 7.2  --  6.5 5.8*  --   ALBUMIN 3.7  --  3.4* 3.1*  --   < > = values in this interval not displayed.  Recent Labs  03/27/16 1219 05/09/16 2305  AMMONIA 25 43*  CBC:  Recent Labs  03/23/16 1311  04/07/16 05/09/16 2044 05/10/16 0456  05/11/16 0345 05/18/16 05/20/16  WBC 7.4  < > 6.7 5.7 6.5  --  8.5 3.4 7.4  NEUTROABS 5.9  --  4  --   --   --   --   --  4440  HGB 11.8*  < > 11.8* 12.7* 12.1*  --  12.2* 11.5* 12.2*  HCT 32.1*  < > 27* 35.1* 34.5*  < > 36.7* 35* 36*  MCV 100.9*  < >  --  98.3 100.6*  --  101.7*  --   --   PLT 141*  < > 288 326 300  --  276 307 392  < > = values in this interval not displayed.  Cardiac Enzymes:  Recent Labs  01/10/16 1712  01/11/16 0528 05/09/16 2305 05/10/16 0456  CKTOTAL 47*  --  82  --   --   CKMB 1.3  --   --   --   --   TROPONINI 4.59*  < > 4.46* 11.59* 11.85*  < > = values in this interval not displayed.  CBG:  Recent Labs  05/09/16 2032 05/10/16 1156 05/11/16 1205  GLUCAP 104* 102* 139*      Ct Abdomen Pelvis Wo Contrast  05/10/2016  CLINICAL DATA:  Abdominal pain post fall a few days prior EXAM: CT  ABDOMEN AND PELVIS WITHOUT CONTRAST TECHNIQUE: Multidetector CT imaging of the abdomen and pelvis was performed following the standard protocol without IV contrast. COMPARISON:  CT 02/16/2016 FINDINGS: Lower chest: Small left pleural effusion, fluid measures simple fluid density. Mild atelectasis in both lower lobes. Liver: Partially obscured by motion.  No evidence of focal lesion. Hepatobiliary: Gallbladder physiologically distended, partially obscured by motion. No calcified stone. Difficult to exclude wall thickening. Pancreas: Partially obscured by motion. No evidence of traumatic injury. Atrophic parenchymal. Spleen: Splenic granuloma. No perisplenic fluid. No evidence of traumatic injury allowing for lack contrast. Adrenal glands: No hemorrhage or nodule. Kidneys: Bilateral nonspecific perinephric edema, stable from prior. No hydronephrosis. No focal abnormality. Stomach/Bowel: Stomach physiologically distended. There are no dilated or thickened small bowel loops. Small-moderate volume of stool throughout the colon without colonic wall thickening. Distal colonic diverticulosis without diverticulitis. The appendix is normal. Vascular/Lymphatic: No retroperitoneal fluid, bleed, or adenopathy. Abdominal aorta is normal in caliber. Reproductive: Prominent sized prostate gland with mass effect on the bladder base. Brachytherapy seeds. Bladder: Physiologically distended, no wall thickening. Other: No free air, free fluid, or intra-abdominal fluid collection. Fat within both inguinal canals. Tiny fat containing umbilical hernia. Musculoskeletal: Left anterior rib fractures are remote. There are no acute or suspicious osseous abnormalities. Motion artifact limits detailed assessment. IMPRESSION: No acute or traumatic abnormality in the abdomen or pelvis allowing for patient motion and lack of intravenous contrast. Small left pleural effusion measures simple fluid density. Electronically Signed   By: Jeb Levering  M.D.   On: 05/10/2016 03:49   Dg Chest 2 View  05/09/2016  CLINICAL DATA:  Fall 5 days prior with altered mental status. EXAM: CHEST  2 VIEW COMPARISON:  Most recent chest radiograph 03/23/2016. Additional priors radiographs reviewed. FINDINGS: The cardiomediastinal contours are unchanged, heart at the upper limits of normal in size. There is thickening of the paratracheal stripes, stable and likely related to overlapping vascular structures. Obscuration of the left hemidiaphragm secondary to small left pleural effusion No pulmonary edema. No pneumothorax. No acute osseous abnormalities are seen. IMPRESSION: Small left pleural effusion. No  pneumothorax or evidence of displaced rib fracture. Electronically Signed   By: Jeb Levering M.D.   On: 05/09/2016 21:29   Ct Head Wo Contrast  05/09/2016  CLINICAL DATA:  Initial evaluation for acute fall 5 days ago. Bruising to left thigh. EXAM: CT HEAD WITHOUT CONTRAST CT MAXILLOFACIAL WITHOUT CONTRAST TECHNIQUE: Multidetector CT imaging of the head and maxillofacial structures were performed using the standard protocol without intravenous contrast. Multiplanar CT image reconstructions of the maxillofacial structures were also generated. COMPARISON:  None. FINDINGS: CT HEAD FINDINGS Diffuse prominence of the CSF containing spaces compatible with generalized cerebral atrophy. Mild to moderate chronic small vessel ischemic disease present. Scattered vascular calcifications within the carotid siphons. No acute intracranial hemorrhage. No acute large vessel territory infarct. No mass lesion, midline shift, or mass effect para ventricular prominence related to global parenchymal volume loss present without hydrocephalus. No extra-axial fluid collection. Left periorbital contusion. Calcification at the anterior aspect of the left globe. Globes themselves grossly intact. No retro-orbital process. Scalp soft tissues otherwise normal. No mastoid effusion. Calvarium intact. CT  MAXILLOFACIAL FINDINGS Left periorbital contusion/ laceration. Two adjacent radiopaque calcific densities present anterior to the left globe measuring up to 7 mm (series 5, image 58). These are suspicious for retained debris. Left globe itself intact. Right globe normal. No retro-orbital hematoma or other pathology. Mild left facial contusion as well. No orbital floor fracture. Small focal old defect in the left lamina papyracea is new relative to prior exam, and likely reflects a small acute fracture (series 9, image 33). Associated 2 mm of displacement. Orbital roofs and lateral orbital walls are intact. Zygomatic arches intact. No acute maxillary fracture. Pterygoid plates intact. No acute nasal bone fracture. Nasal septum intact. Mandible intact. Mandibular condyles mildly subluxed anteriorly relative to the temporomandibular fossa, likely related to jaw positioning. Poor dentition noted. No acute abnormality about the dentition itself. Chronic bilateral maxillary sinus disease again noted, stable. Minimal mucosal thickening within the ethmoidal air cells. Paranasal sinuses are otherwise clear. Visualized upper cervical spine grossly intact. IMPRESSION: CT HEAD: 1. No acute intracranial process. 2. Stable atrophy with chronic small vessel ischemic disease. CT MAXILLOFACIAL: 1. Left periorbital/facial contusion with left periorbital laceration. Scattered radiopaque densities anterior to the left globe likely reflect retained debris. Globe itself intact with no retro-orbital pathology. 2. Acute fracture of the left lamina papyracea with 2 mm of displacement. While this fracture somewhat age indeterminate in appearance, this is new relative to most recent CT from 03/22/2016, and is suspected to have occurred with this most recent trauma 3. Chronic maxillary sinusitis. Electronically Signed   By: Jeannine Boga M.D.   On: 05/09/2016 22:00   Ct Maxillofacial Wo Cm  05/09/2016  CLINICAL DATA:  Initial  evaluation for acute fall 5 days ago. Bruising to left thigh. EXAM: CT HEAD WITHOUT CONTRAST CT MAXILLOFACIAL WITHOUT CONTRAST TECHNIQUE: Multidetector CT imaging of the head and maxillofacial structures were performed using the standard protocol without intravenous contrast. Multiplanar CT image reconstructions of the maxillofacial structures were also generated. COMPARISON:  None. FINDINGS: CT HEAD FINDINGS Diffuse prominence of the CSF containing spaces compatible with generalized cerebral atrophy. Mild to moderate chronic small vessel ischemic disease present. Scattered vascular calcifications within the carotid siphons. No acute intracranial hemorrhage. No acute large vessel territory infarct. No mass lesion, midline shift, or mass effect para ventricular prominence related to global parenchymal volume loss present without hydrocephalus. No extra-axial fluid collection. Left periorbital contusion. Calcification at the anterior aspect of the left  globe. Globes themselves grossly intact. No retro-orbital process. Scalp soft tissues otherwise normal. No mastoid effusion. Calvarium intact. CT MAXILLOFACIAL FINDINGS Left periorbital contusion/ laceration. Two adjacent radiopaque calcific densities present anterior to the left globe measuring up to 7 mm (series 5, image 58). These are suspicious for retained debris. Left globe itself intact. Right globe normal. No retro-orbital hematoma or other pathology. Mild left facial contusion as well. No orbital floor fracture. Small focal old defect in the left lamina papyracea is new relative to prior exam, and likely reflects a small acute fracture (series 9, image 33). Associated 2 mm of displacement. Orbital roofs and lateral orbital walls are intact. Zygomatic arches intact. No acute maxillary fracture. Pterygoid plates intact. No acute nasal bone fracture. Nasal septum intact. Mandible intact. Mandibular condyles mildly subluxed anteriorly relative to the  temporomandibular fossa, likely related to jaw positioning. Poor dentition noted. No acute abnormality about the dentition itself. Chronic bilateral maxillary sinus disease again noted, stable. Minimal mucosal thickening within the ethmoidal air cells. Paranasal sinuses are otherwise clear. Visualized upper cervical spine grossly intact. IMPRESSION: CT HEAD: 1. No acute intracranial process. 2. Stable atrophy with chronic small vessel ischemic disease. CT MAXILLOFACIAL: 1. Left periorbital/facial contusion with left periorbital laceration. Scattered radiopaque densities anterior to the left globe likely reflect retained debris. Globe itself intact with no retro-orbital pathology. 2. Acute fracture of the left lamina papyracea with 2 mm of displacement. While this fracture somewhat age indeterminate in appearance, this is new relative to most recent CT from 03/22/2016, and is suspected to have occurred with this most recent trauma 3. Chronic maxillary sinusitis. Electronically Signed   By: Jeannine Boga M.D.   On: 05/09/2016 22:00    ASSESSMENT/PLAN:  Generalized weakness - for rehabilitation, PT, SW and OT  Alcoholic withdrawal - continue hydroxyzine 10 mg 1 tab by mouth 3 times a day when necessary and  Ativan 0.5 mg 1 tab by mouth twice a day when necessary; continue folic acid 1 mg daily and thiamine 100 mg daily  Depression - mood is stable; continue bupropion SR 200 mg 1 tab by mouth daily and Celexa 20 mg 1 tab by mouth daily  BPH - continue Flomax 0.4 mg 1 capsule by mouth daily  Elevated ammonia level - ammonia 43 ;  re-checked ammonia level 40; decrease lactulose 10 g per 15 mL solution. 83 ML twice a day  Hypertension - well-controlled; continue Coreg 12.5 mg 1 tab by mouth twice a day  Protein calorie malnutrition  - albumin 3.1; continue Procel 1 scoop by mouth twice a day  Hyperlipidemia - continue atorvastatin 40 mg 1 tab by mouth every 6 p.m. Lab Results  Component Value  Date   CHOL 101 05/18/2016   HDL 38 05/18/2016   LDLCALC 50 05/18/2016   TRIG 70 05/18/2016   CHOLHDL 2.1 07/04/2014    Hypomagnesemia - magnesium 1.4; continue magnesium 500 mg 1 tab by mouth daily at bedtime; re-checked magnesium level 2.2  CAD - continue aspirin 81 mg daily and NTG when necessary  Gout - uric acid 9.0, elevated; start Colchicine 0.6 mg 1 tab PO then repeat in 1 hour then daily X 2 weeks      I have filled out patient's discharge paperwork and written prescriptions.  Patient will receive home health PT, OT and SW.  Total discharge time: Greater than 30 minutes  Discharge time involved coordination of the discharge process with social worker, nursing staff and therapy department. Medical justification  for home health services verified.    Durenda Age, NP [employer] [phone]

## 2016-05-31 DIAGNOSIS — G9340 Encephalopathy, unspecified: Secondary | ICD-10-CM | POA: Diagnosis not present

## 2016-06-15 DIAGNOSIS — H353130 Nonexudative age-related macular degeneration, bilateral, stage unspecified: Secondary | ICD-10-CM | POA: Diagnosis not present

## 2016-06-15 DIAGNOSIS — H5213 Myopia, bilateral: Secondary | ICD-10-CM | POA: Diagnosis not present

## 2016-06-15 DIAGNOSIS — H524 Presbyopia: Secondary | ICD-10-CM | POA: Diagnosis not present

## 2016-06-15 DIAGNOSIS — H2513 Age-related nuclear cataract, bilateral: Secondary | ICD-10-CM | POA: Diagnosis not present

## 2016-06-15 DIAGNOSIS — H40013 Open angle with borderline findings, low risk, bilateral: Secondary | ICD-10-CM | POA: Diagnosis not present

## 2016-07-06 DIAGNOSIS — H40013 Open angle with borderline findings, low risk, bilateral: Secondary | ICD-10-CM | POA: Diagnosis not present

## 2016-07-06 DIAGNOSIS — H401220 Low-tension glaucoma, left eye, stage unspecified: Secondary | ICD-10-CM | POA: Diagnosis not present

## 2016-07-06 DIAGNOSIS — H5340 Unspecified visual field defects: Secondary | ICD-10-CM | POA: Diagnosis not present

## 2016-07-06 DIAGNOSIS — H3589 Other specified retinal disorders: Secondary | ICD-10-CM | POA: Diagnosis not present

## 2016-07-26 DIAGNOSIS — H401222 Low-tension glaucoma, left eye, moderate stage: Secondary | ICD-10-CM | POA: Diagnosis not present

## 2016-08-24 ENCOUNTER — Emergency Department (HOSPITAL_COMMUNITY): Payer: Medicare Other

## 2016-08-24 ENCOUNTER — Emergency Department (HOSPITAL_COMMUNITY)
Admission: EM | Admit: 2016-08-24 | Discharge: 2016-08-24 | Disposition: A | Discharge status: Home / Self Care | Payer: Medicare Other | Attending: Emergency Medicine | Admitting: Emergency Medicine

## 2016-08-24 ENCOUNTER — Encounter (HOSPITAL_COMMUNITY): Payer: Self-pay | Admitting: Emergency Medicine

## 2016-08-24 DIAGNOSIS — W19XXXA Unspecified fall, initial encounter: Secondary | ICD-10-CM | POA: Insufficient documentation

## 2016-08-24 DIAGNOSIS — Z8546 Personal history of malignant neoplasm of prostate: Secondary | ICD-10-CM | POA: Insufficient documentation

## 2016-08-24 DIAGNOSIS — S0990XA Unspecified injury of head, initial encounter: Secondary | ICD-10-CM | POA: Diagnosis not present

## 2016-08-24 DIAGNOSIS — F10120 Alcohol abuse with intoxication, uncomplicated: Secondary | ICD-10-CM | POA: Diagnosis not present

## 2016-08-24 DIAGNOSIS — Y939 Activity, unspecified: Secondary | ICD-10-CM | POA: Diagnosis not present

## 2016-08-24 DIAGNOSIS — Y999 Unspecified external cause status: Secondary | ICD-10-CM | POA: Insufficient documentation

## 2016-08-24 DIAGNOSIS — Z79899 Other long term (current) drug therapy: Secondary | ICD-10-CM | POA: Insufficient documentation

## 2016-08-24 DIAGNOSIS — I10 Essential (primary) hypertension: Secondary | ICD-10-CM | POA: Diagnosis not present

## 2016-08-24 DIAGNOSIS — S62626A Displaced fracture of medial phalanx of right little finger, initial encounter for closed fracture: Secondary | ICD-10-CM | POA: Insufficient documentation

## 2016-08-24 DIAGNOSIS — F17210 Nicotine dependence, cigarettes, uncomplicated: Secondary | ICD-10-CM | POA: Diagnosis not present

## 2016-08-24 DIAGNOSIS — M5134 Other intervertebral disc degeneration, thoracic region: Secondary | ICD-10-CM | POA: Insufficient documentation

## 2016-08-24 DIAGNOSIS — R52 Pain, unspecified: Secondary | ICD-10-CM

## 2016-08-24 DIAGNOSIS — S50312A Abrasion of left elbow, initial encounter: Secondary | ICD-10-CM | POA: Diagnosis not present

## 2016-08-24 DIAGNOSIS — M5030 Other cervical disc degeneration, unspecified cervical region: Secondary | ICD-10-CM | POA: Diagnosis not present

## 2016-08-24 DIAGNOSIS — F1010 Alcohol abuse, uncomplicated: Secondary | ICD-10-CM

## 2016-08-24 DIAGNOSIS — E876 Hypokalemia: Secondary | ICD-10-CM | POA: Diagnosis not present

## 2016-08-24 DIAGNOSIS — R296 Repeated falls: Secondary | ICD-10-CM | POA: Diagnosis not present

## 2016-08-24 DIAGNOSIS — S62622A Displaced fracture of medial phalanx of right middle finger, initial encounter for closed fracture: Secondary | ICD-10-CM | POA: Diagnosis not present

## 2016-08-24 DIAGNOSIS — I2510 Atherosclerotic heart disease of native coronary artery without angina pectoris: Secondary | ICD-10-CM | POA: Insufficient documentation

## 2016-08-24 DIAGNOSIS — F10920 Alcohol use, unspecified with intoxication, uncomplicated: Secondary | ICD-10-CM

## 2016-08-24 DIAGNOSIS — S6991XA Unspecified injury of right wrist, hand and finger(s), initial encounter: Secondary | ICD-10-CM | POA: Diagnosis present

## 2016-08-24 DIAGNOSIS — T1490 Injury, unspecified: Secondary | ICD-10-CM | POA: Diagnosis not present

## 2016-08-24 DIAGNOSIS — T148XXA Other injury of unspecified body region, initial encounter: Secondary | ICD-10-CM

## 2016-08-24 DIAGNOSIS — Y92009 Unspecified place in unspecified non-institutional (private) residence as the place of occurrence of the external cause: Secondary | ICD-10-CM | POA: Diagnosis not present

## 2016-08-24 DIAGNOSIS — T148 Other injury of unspecified body region: Secondary | ICD-10-CM | POA: Diagnosis not present

## 2016-08-24 DIAGNOSIS — M50322 Other cervical disc degeneration at C5-C6 level: Secondary | ICD-10-CM | POA: Diagnosis not present

## 2016-08-24 DIAGNOSIS — Z7982 Long term (current) use of aspirin: Secondary | ICD-10-CM | POA: Insufficient documentation

## 2016-08-24 DIAGNOSIS — M47814 Spondylosis without myelopathy or radiculopathy, thoracic region: Secondary | ICD-10-CM

## 2016-08-24 DIAGNOSIS — S62609A Fracture of unspecified phalanx of unspecified finger, initial encounter for closed fracture: Secondary | ICD-10-CM

## 2016-08-24 DIAGNOSIS — S0083XA Contusion of other part of head, initial encounter: Secondary | ICD-10-CM | POA: Diagnosis not present

## 2016-08-24 DIAGNOSIS — R102 Pelvic and perineal pain: Secondary | ICD-10-CM | POA: Diagnosis not present

## 2016-08-24 LAB — RAPID URINE DRUG SCREEN, HOSP PERFORMED
AMPHETAMINES: NOT DETECTED
BARBITURATES: NOT DETECTED
Benzodiazepines: POSITIVE — AB
COCAINE: NOT DETECTED
OPIATES: NOT DETECTED
Tetrahydrocannabinol: NOT DETECTED

## 2016-08-24 LAB — COMPREHENSIVE METABOLIC PANEL
ALT: 32 U/L (ref 17–63)
ANION GAP: 12 (ref 5–15)
AST: 50 U/L — ABNORMAL HIGH (ref 15–41)
Albumin: 3.9 g/dL (ref 3.5–5.0)
Alkaline Phosphatase: 100 U/L (ref 38–126)
BILIRUBIN TOTAL: 0.8 mg/dL (ref 0.3–1.2)
BUN: 11 mg/dL (ref 6–20)
CHLORIDE: 106 mmol/L (ref 101–111)
CO2: 26 mmol/L (ref 22–32)
Calcium: 8.7 mg/dL — ABNORMAL LOW (ref 8.9–10.3)
Creatinine, Ser: 0.6 mg/dL — ABNORMAL LOW (ref 0.61–1.24)
Glucose, Bld: 96 mg/dL (ref 65–99)
POTASSIUM: 3 mmol/L — AB (ref 3.5–5.1)
Sodium: 144 mmol/L (ref 135–145)
TOTAL PROTEIN: 6.7 g/dL (ref 6.5–8.1)

## 2016-08-24 LAB — CBC WITH DIFFERENTIAL/PLATELET
BASOS PCT: 0 %
Basophils Absolute: 0 10*3/uL (ref 0.0–0.1)
EOS ABS: 0.1 10*3/uL (ref 0.0–0.7)
EOS PCT: 1 %
HCT: 39.4 % (ref 39.0–52.0)
HEMOGLOBIN: 13.8 g/dL (ref 13.0–17.0)
LYMPHS ABS: 1.8 10*3/uL (ref 0.7–4.0)
Lymphocytes Relative: 24 %
MCH: 32.9 pg (ref 26.0–34.0)
MCHC: 35 g/dL (ref 30.0–36.0)
MCV: 94 fL (ref 78.0–100.0)
MONO ABS: 0.7 10*3/uL (ref 0.1–1.0)
MONOS PCT: 9 %
NEUTROS PCT: 66 %
Neutro Abs: 4.9 10*3/uL (ref 1.7–7.7)
PLATELETS: 213 10*3/uL (ref 150–400)
RBC: 4.19 MIL/uL — ABNORMAL LOW (ref 4.22–5.81)
RDW: 14.8 % (ref 11.5–15.5)
WBC: 7.5 10*3/uL (ref 4.0–10.5)

## 2016-08-24 LAB — URINALYSIS, ROUTINE W REFLEX MICROSCOPIC
Glucose, UA: NEGATIVE mg/dL
HGB URINE DIPSTICK: NEGATIVE
Ketones, ur: NEGATIVE mg/dL
Leukocytes, UA: NEGATIVE
Nitrite: NEGATIVE
Protein, ur: NEGATIVE mg/dL
SPECIFIC GRAVITY, URINE: 1.019 (ref 1.005–1.030)
pH: 7 (ref 5.0–8.0)

## 2016-08-24 LAB — I-STAT CG4 LACTIC ACID, ED: LACTIC ACID, VENOUS: 3.48 mmol/L — AB (ref 0.5–1.9)

## 2016-08-24 LAB — APTT: APTT: 29 s (ref 24–36)

## 2016-08-24 LAB — ETHANOL: ALCOHOL ETHYL (B): 273 mg/dL — AB (ref <5)

## 2016-08-24 LAB — PROTIME-INR
INR: 0.99
Prothrombin Time: 13.1 seconds (ref 11.4–15.2)

## 2016-08-24 LAB — AMMONIA: AMMONIA: 17 umol/L (ref 9–35)

## 2016-08-24 MED ORDER — SODIUM CHLORIDE 0.9 % IV SOLN
INTRAVENOUS | Status: DC
  Administered 2016-08-24: 20:00:00 via INTRAVENOUS

## 2016-08-24 MED ORDER — POTASSIUM CHLORIDE CRYS ER 20 MEQ PO TBCR
60.0000 meq | EXTENDED_RELEASE_TABLET | Freq: Once | ORAL | Status: AC
  Administered 2016-08-24: 60 meq via ORAL
  Filled 2016-08-24: qty 3

## 2016-08-24 MED ORDER — SODIUM CHLORIDE 0.9 % IV BOLUS (SEPSIS)
1000.0000 mL | Freq: Once | INTRAVENOUS | Status: AC
  Administered 2016-08-24: 1000 mL via INTRAVENOUS

## 2016-08-24 NOTE — ED Notes (Signed)
PA at bedside.

## 2016-08-24 NOTE — ED Triage Notes (Addendum)
Per EMS pts wife called EMS stating pt has been falling constantly. Pt was in [facility] for rehab and placed on medication for high ammonia levels. According to wife he has stopped taking medications for last 3 weeks and has been heavily drinking. EMS reports abrasion to left forearms and redness noted on thoracic portion of back, no deformity notified. Pt alert and orientated to name and president, not to situation or place. Pt is verbally abusive,agitated and cussing, not listening to staff. 5 versed given en route.  Wife states pt has been complaining of left sided hip pain for about a week, unsure if its related to falls.

## 2016-08-24 NOTE — Discharge Instructions (Signed)
STOP DRINKING ALCOHOL! Your xrays, CT scans, and labs were reassuring aside from showing that you were intoxicated, have a fractured finger (right pinky), and you have some mild arthritis of your neck and back. Follow up with your regular doctor in 2-3 days for ongoing management of your medical conditions and for help with your alcoholism. Stay well hydrated. Return to the ER for changes or worsening symptoms.   For your finger fracture: use the splint at all times until you see the hand specialist. Ice and elevate the finger to help with pain and swelling. Use tylenol and motrin as needed for pain. Follow up with the hand specialist in 1 week for recheck and ongoing management of your finger fracture.

## 2016-08-24 NOTE — ED Notes (Signed)
Bed: [id number] Expected date:  Expected time:  Means of arrival:  Comments: EMS- 70yo M, AMS/increased falls/ETOH

## 2016-08-24 NOTE — ED Notes (Signed)
Wife [name] [phone]

## 2016-08-24 NOTE — ED Provider Notes (Signed)
[place] DEPT Provider Note   CSN: [id number] Arrival date & time: 08/24/16  1758     History   Chief Complaint Chief Complaint  Patient presents with  . Fall  . Alcohol Intoxication    HPI Joshua Henson is a 70 y.o. male with a PMHx of alcoholism with many ER visits for falls related to alcohol intoxication, prior acute encephalopathy admission 04/2016, polysubstance abuse, hx of DTs with alcohol withdrawal, CAD, CVA, depression, fatty liver disease, HTN, NSTEMI, and prior prostate cancer, who presents to the ED via EMS who were called out for complaints of recurrent falls and alcohol intoxication.Marland Kitchen LEVEL 5 CAVEAT DUE TO PT BEING UNCOOPERATIVE AND INTOXICATED. Per EMS, wife reports that he has been following constantly, was recently in [facility] for rehabilitation and was placed on medication for "high ammonia levels" (presumably lactulose) but has stopped taking them over the last 3 weeks and has been drinking heavily. 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 He is unable to provide me with any additional information, but denies any pain or any complaints at this time. Chart review reveals that he was seen several times over the last several months for recurrent falls due to alcohol intoxication, last admission was in 04/2016 for hepatic encephalopathy, was discharged to a SNF after that hospitalization.    The history is provided by the patient, the EMS personnel and medical records. No language interpreter was used.  Fall  This is a recurrent problem. The problem occurs constantly. The problem has not changed since onset. Alcohol Intoxication     Past Medical History:  Diagnosis Date  . Acute hepatic encephalopathy ([place])   . Alcoholic cirrhosis of liver without ascites ([place])   . Anxiety  state   . CAD (coronary artery disease), native coronary artery 01/10/2016   3 vessel coronary calcification noted on CT scan 04/03/15    . Depression   . Fatty liver, alcoholic [id number]  . Hypertension   . Increased ammonia level   . Insomnia   . NSTEMI (non-ST elevated myocardial infarction) ([place])   . Prostate cancer ([place]) 2010   External beam radiation (urol - Risa Grill, XRT Valere Dross)  . Stroke ([place])   . Tachycardia     Patient Active Problem List   Diagnosis Date Noted  . Left upper quadrant pain 05/10/2016  . Encephalopathy 05/09/2016  . C. difficile colitis 03/30/2016  . Acute kidney injury ([place]) 03/30/2016  . Essential hypertension 03/23/2016  . Fall   . Tobacco abuse   . Hypokalemia   . Hypomagnesemia   . Hypophosphatemia   . CAP (community acquired pneumonia) due to MSSA (methicillin sensitive Staphylococcus aureus) ([place])   . CAD in native artery   . Cocaine abuse   . Alcohol abuse with intoxication ([place]) 01/11/2016  . Frequent falls 01/11/2016  . Left rib fracture 01/11/2016  . Acute encephalopathy 01/11/2016  . Polysubstance abuse 01/11/2016  . Delirium tremens ([place]) 01/11/2016  . NSTEMI (non-ST elevated myocardial infarction) ([place]) 01/11/2016  . Sepsis due to Enterococcus with acute renal failure and metabolic encephalopathy ([place]) 01/11/2016  . Alcohol intoxication ([place])   . Chronic alcohol abuse   . Acute respiratory failure with hypoxia ([place])   . Elevated troponin 01/10/2016  . Chest pain 01/10/2016  . CAD (coronary artery disease), native  coronary artery 01/10/2016  . Fatty liver, alcoholic [id number]  . Tremor 04/02/2015  . Ataxia   . Alcohol withdrawal ([place]) 09/03/2014  . Unspecified cerebral artery occlusion with cerebral infarction 07/05/2014  . Alcohol abuse 07/02/2014  . Current tobacco use 01/17/2014  . Hearing loss 11/25/2013  . Tinnitus of both ears 11/25/2013    Past Surgical History:  Procedure Laterality Date  . CARDIAC CATHETERIZATION  N/A 01/15/2016   Procedure: Left Heart Cath and Coronary Angiography;  Surgeon: Burnell Blanks, MD;  Location: [facility];  Service: Cardiovascular;  Laterality: N/A;  . none         Home Medications    Prior to Admission medications   Medication Sig Start Date End Date Taking? Authorizing Provider  acetaminophen (TYLENOL) 325 MG tablet Take 2 tablets (650 mg total) by mouth every 6 (six) hours as needed for mild pain (or Fever >/= 101). 05/13/16   Velvet Bathe, MD  aspirin 81 MG EC tablet Take 1 tablet (81 mg total) by mouth daily. 07/11/14   Lavon Paganini Angiulli, PA-C  atorvastatin (LIPITOR) 40 MG tablet Take 1 tablet (40 mg total) by mouth daily at 6 PM. 01/19/16   Allie Bossier, MD  buPROPion [facility] SR) 100 MG 12 hr tablet Take 200 mg by mouth daily.  04/19/16   Historical Provider, MD  carvedilol (COREG) 12.5 MG tablet Take 1 tablet (12.5 mg total) by mouth 2 (two) times daily with a meal. 05/13/16   Velvet Bathe, MD  cholecalciferol (VITAMIN D) 1000 units tablet Take 2,000 Units by mouth daily.    Historical Provider, MD  citalopram (CELEXA) 20 MG tablet Take 20 mg by mouth daily.    Historical Provider, MD  Coenzyme Q10 (CO Q-10) 200 MG CAPS Take 200 mg by mouth daily.    Historical Provider, MD  folic acid (FOLVITE) 1 MG tablet Take 1 tablet (1 mg total) by mouth daily. 07/11/14   Lavon Paganini Angiulli, PA-C  hydrOXYzine (ATARAX/VISTARIL) 10 MG tablet Take 1 tablet (10 mg total) by mouth 3 (three) times daily as needed for anxiety. 05/13/16   Velvet Bathe, MD  lactulose (CHRONULAC) 10 GM/15ML solution Take by mouth 3 (three) times daily. Give 45 mL. Hold if having 3 BMs/day    Historical Provider, MD  LORazepam (ATIVAN) 0.5 MG tablet Take 0.5 mg by mouth 2 (two) times daily as needed for anxiety.    Historical Provider, MD  Magnesium 500 MG CAPS Take 500 mg by mouth at bedtime.    Historical Provider, MD  Multiple Vitamin (MULTIVITAMIN WITH MINERALS) TABS tablet Take 1 tablet  by mouth daily. 05/13/16   Velvet Bathe, MD  nitroGLYCERIN (NITROSTAT) 0.4 MG SL tablet Place 1 tablet (0.4 mg total) under the tongue every 5 (five) minutes as needed for chest pain. 05/13/16   Velvet Bathe, MD  Omega-3 Fatty Acids (FISH OIL) 1000 MG CAPS Take 1,000 mg by mouth 2 (two) times daily.     Historical Provider, MD  Protein (PROCEL 100) POWD Take 1 scoop by mouth 2 (two) times daily.    Historical Provider, MD  tamsulosin (FLOMAX) 0.4 MG CAPS capsule Take 0.4 mg by mouth daily.  11/25/14   Historical Provider, MD  thiamine (VITAMIN B-1) 100 MG tablet Take 100 mg by mouth daily.    Historical Provider, MD    Family History Family History  Problem Relation Age of Onset  . Cancer Mother     esophageal  . Hypertension Mother   .  Diabetes Mother   . Cancer Father     prostate  . Heart disease Maternal Grandfather     MI  . Cancer Paternal Grandfather     prostate    Social History Social History  Substance Use Topics  . Smoking status: Current Every Day Smoker    Packs/day: 0.50    Years: 30.00    Types: Cigarettes  . Smokeless tobacco: Never Used  . Alcohol use 33.6 oz/week    56 Shots of liquor per week     Comment: last drink yesterday     Allergies   Lisinopril and Celebrex [celecoxib]   Review of Systems Review of Systems  Unable to perform ROS: Other   Level 5 caveat due to intoxication/uncooperativeness  Physical Exam Updated Vital Signs BP (!) 144/102 (BP Location: Left Arm)   Pulse 85   Temp 98.3 F (36.8 C) (Oral)   Resp 16   Ht 5\' 10"  (1.778 m)   Wt 81.6 kg   SpO2 99%   BMI 25.83 kg/m   Physical Exam  Constitutional: He is oriented to person, place, and time. Vital signs are normal. He appears well-developed and well-nourished. He is uncooperative.  Non-toxic appearance. No distress. Cervical collar and backboard in place.  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 On back board and C-collar in  place  HENT:  Head: Normocephalic. Head is with contusion. Head is without raccoon's eyes, without Battle's sign and without abrasion.    Mouth/Throat: Oropharynx is clear and moist and mucous membranes are normal.  Small contusion to R cheek. No scalp or facial tenderness or crepitus, no dental loosening, no raccoon eyes or battle's sign.   Eyes: Conjunctivae and EOM are normal. Pupils are equal, round, and reactive to light. Right eye exhibits no discharge. Left eye exhibits no discharge.  PERRL, EOMI, uncooperative with full exam but no obvious nystagmus noted  Neck: Spinous process tenderness and muscular tenderness present.  C-collar in place, mild diffuse midline spinous process TTP, no bony stepoffs or deformities, with mild b/l paraspinous muscle TTP without muscle spasms.  Cardiovascular: Normal rate, regular rhythm, normal heart sounds and intact distal pulses.  Exam reveals no gallop and no friction rub.   No murmur heard. Pulmonary/Chest: Effort normal and breath sounds normal. No respiratory distress. He has no decreased breath sounds. He has no wheezes. He has no rhonchi. He has no rales.  Abdominal: Soft. Normal appearance and bowel sounds are normal. He exhibits no distension. There is no tenderness. There is no rigidity, no rebound, no guarding, no CVA tenderness, no tenderness at McBurney's point and negative Murphy's sign.  Musculoskeletal: Normal range of motion.  C-spine as above All other spinal levels nonTTP without any bony stepoffs or deformities. Slightly erythematous region to the T-spine area that seems as though he has been laying on his back, no bruising or warmth, no evidence of cellulitis.  All extremities nonTTP without deformities. Small abrasion/skin tear to L elbow. Small healing bruise to R hip. Bruising and swelling to R pinky. No tenderness with pelvic squeeze, no pelvic instability. Multiple scabbed over areas on b/l legs. MAE x4, FROM intact at shoulders,  elbows, wrists, fingers, knees, ankles, toes, and hips. Strength and sensation grossly intact in all extremities Distal pulses intact  Neurological: He is alert and oriented to person, place, and time. He has normal strength. No sensory deficit.  A&O x3, rambles, doesn't cooperate with full exam but no focal  neuro deficits noted on the limited exam that can be performed given his current condition  Skin: Skin is warm and dry. Abrasion and bruising noted. No rash noted.  Abrasions and bruises of extremities as noted above  Psychiatric: His speech is tangential.  Tangential speech, cursing often throughout exam, easily redirected  Nursing note and vitals reviewed.    ED Treatments / Results  Labs (all labs ordered are listed, but only abnormal results are displayed) Labs Reviewed  CBC WITH DIFFERENTIAL/PLATELET - Abnormal; Notable for the following:       Result Value   RBC 4.19 (*)    All other components within normal limits  COMPREHENSIVE METABOLIC PANEL - Abnormal; Notable for the following:    Potassium 3.0 (*)    Creatinine, Ser 0.60 (*)    Calcium 8.7 (*)    AST 50 (*)    All other components within normal limits  ETHANOL - Abnormal; Notable for the following:    Alcohol, Ethyl (B) 273 (*)    All other components within normal limits  I-STAT CG4 LACTIC ACID, ED - Abnormal; Notable for the following:    Lactic Acid, Venous 3.48 (*)    All other components within normal limits  AMMONIA  APTT  PROTIME-INR  URINE RAPID DRUG SCREEN, HOSP PERFORMED  URINALYSIS, ROUTINE W REFLEX MICROSCOPIC (NOT AT [facility])    EKG  EKG Interpretation  Date/Time:  Wednesday August 24 2016 18:06:22 EDT Ventricular Rate:  84 PR Interval:    QRS Duration: 94 QT Interval:  394 QTC Calculation: 466 R Axis:   84 Text Interpretation:  Sinus rhythm Borderline right axis deviation Interpretation limited secondary to artifact No significant change since last tracing Confirmed by Glynn Octave [phone]) on 08/24/2016 8:05:16 PM       Radiology Dg Thoracic Spine 2 View  Result Date: 08/24/2016 CLINICAL DATA:  Multiple falls recently, initial encounter EXAM: THORACIC SPINE 2 VIEWS COMPARISON:  05/09/2016 FINDINGS: Vertebral body height is well maintained. Mild osteophytic changes are seen. No paraspinal mass lesion is noted. No definitive rib abnormality is seen. IMPRESSION: Mild degenerative change without acute abnormality. Electronically Signed   By: Inez Catalina M.D.   On: 08/24/2016 19:36   Dg Pelvis 1-2 Views  Result Date: 08/24/2016 CLINICAL DATA:  Multiple falls with pelvic pain, initial encounter EXAM: PELVIS - 1-2 VIEW COMPARISON:  None. FINDINGS: Pelvic ring is intact. No acute fracture or dislocation is seen. No soft tissue abnormality is noted. IMPRESSION: Negative. Electronically Signed   By: Inez Catalina M.D.   On: 08/24/2016 19:37   Ct Head Wo Contrast  Result Date: 08/24/2016 CLINICAL DATA:  Per EMS pts wife called EMS stating pt has been falling constantly. Pt was in [facility] for rehab and placed on medication for high ammonia levels. According to wife he has stopped taking medications for last 3 weeks and has been heavily drinking. EMS reports abrasion to left forearms and redness noted on thoracic portion of back, no deformity notified. Pt alert and orientated to name and president, not to situation or place. Pt is verbally abusive,agitated and cu patient is noncooperative. 5 versed given en route. Wife states pt has been complaining of left sided hip pain for about a week, unsure if its related to falls. EXAM: CT HEAD WITHOUT CONTRAST CT MAXILLOFACIAL WITHOUT CONTRAST CT CERVICAL SPINE WITHOUT CONTRAST TECHNIQUE: Multidetector CT imaging of the head, cervical spine, and maxillofacial structures were performed using the standard protocol without intravenous contrast.  Multiplanar CT image reconstructions of the cervical spine and maxillofacial structures were also  generated. COMPARISON:  05/09/2016 FINDINGS: CT HEAD FINDINGS Brain: Periventricular white matter and corona radiata hypodensities favor chronic ischemic microvascular white matter disease. The brainstem, cerebellum, cerebral peduncles, thalami, basal ganglia, basilar cisterns, and ventricular system appear within normal limits. No intracranial hemorrhage, mass lesion, or acute CVA. Vascular: There is atherosclerotic calcification of the cavernous carotid arteries bilaterally. Skull: Unremarkable Sinuses/Orbits: Deferred to facial CT Other: No supplemental non-categorized findings. CT MAXILLOFACIAL FINDINGS Osseous: Prior left medial orbital wall fracture shown on facial CT of 05/09/2016 is less conspicuous today. There is some calcification along the right anterior globe margin, not changed. No facial fracture identified. Periapical lucency associated with the right maxillary canine and adjacent right maxillary premolar implant, with demineralization of the maxilla for example on image 38/4. Orbits: Calcification along the left anterior globe as noted previously. No significant intraorbital abnormality observed. Prior tiny medial orbital wall fracture on the left is much less conspicuous today. Sinuses: Mildly expansile mucous retention cyst in the left maxillary sinus with chronic bilateral maxillary sinusitis and mild chronic ethmoid sinusitis. Prior right maxillary antrectomy. Soft tissues: Abnormal subcutaneous edema/ soft tissue swelling overlying the right maxilla/ zygomatic arch, potentially from facial contusion, image 41/3. CT CERVICAL SPINE FINDINGS Alignment: 3 mm degenerative grade 1 anterolisthesis at C6-7 with 2 mm degenerative retrolisthesis at C4-5. Skull base and vertebrae: Prominent loss of articular space at the anterior C1-2 articulation with associated spurring. Fused facet joints bilaterally at C4-5 with extensive facet spurring. Endplate sclerosis and endplate irregularity at C5-6 with  posterior osseous ridging. No cervical spine fracture identified. Soft tissues and spinal canal: Unremarkable Disc levels:  C2-3:  No impingement.  Bilateral facet arthropathy. C3-4: Moderate to prominent right and mild left foraminal impingement due to uncinate and facet spurring. C4-5: No definite osseous foraminal impingement. Bilateral facet arthropathy and right uncinate spurring. C5-6: Prominent right and moderate left osseous foraminal stenosis with mild central stenosis due to posterior osseous ridging, uncinate spurring, and facet arthropathy. C6-7:  No impingement.  Bilateral facet arthropathy. C7-T1:  Unremarkable. Upper chest: Unremarkable Other: No supplemental non-categorized findings. IMPRESSION: 1. CT head demonstrates chronic ischemic microvascular white matter disease but no other significant findings. 2. Maxillofacial CT demonstrates similar findings to the prior exam, including chronic ethmoid and maxillary sinusitis and a calcification along the left anterior globe. More notably, there is continued periapical lucency associated with the right maxillary canine and adjacent right maxillary premolar implant, and periapical abscess is not excluded. There is also a new subcutaneous edema along the right maxilla/zygomatic arch, potentially from facial contusion. 3. Cervical spine demonstrate spondylosis and degenerative disc disease causing prominent impingement at C5-6 and moderate to prominent impingement at C3-4. Electronically Signed   By: Van Clines M.D.   On: 08/24/2016 20:06   Ct Cervical Spine Wo Contrast  Result Date: 08/24/2016 CLINICAL DATA:  Per EMS pts wife called EMS stating pt has been falling constantly. Pt was in [facility] for rehab and placed on medication for high ammonia levels. According to wife he has stopped taking medications for last 3 weeks and has been heavily drinking. EMS reports abrasion to left forearms and redness noted on thoracic portion of back, no  deformity notified. Pt alert and orientated to name and president, not to situation or place. Pt is verbally abusive,agitated and cu patient is noncooperative. 5 versed given en route. Wife states pt has been complaining of left sided hip  pain for about a week, unsure if its related to falls. EXAM: CT HEAD WITHOUT CONTRAST CT MAXILLOFACIAL WITHOUT CONTRAST CT CERVICAL SPINE WITHOUT CONTRAST TECHNIQUE: Multidetector CT imaging of the head, cervical spine, and maxillofacial structures were performed using the standard protocol without intravenous contrast. Multiplanar CT image reconstructions of the cervical spine and maxillofacial structures were also generated. COMPARISON:  05/09/2016 FINDINGS: CT HEAD FINDINGS Brain: Periventricular white matter and corona radiata hypodensities favor chronic ischemic microvascular white matter disease. The brainstem, cerebellum, cerebral peduncles, thalami, basal ganglia, basilar cisterns, and ventricular system appear within normal limits. No intracranial hemorrhage, mass lesion, or acute CVA. Vascular: There is atherosclerotic calcification of the cavernous carotid arteries bilaterally. Skull: Unremarkable Sinuses/Orbits: Deferred to facial CT Other: No supplemental non-categorized findings. CT MAXILLOFACIAL FINDINGS Osseous: Prior left medial orbital wall fracture shown on facial CT of 05/09/2016 is less conspicuous today. There is some calcification along the right anterior globe margin, not changed. No facial fracture identified. Periapical lucency associated with the right maxillary canine and adjacent right maxillary premolar implant, with demineralization of the maxilla for example on image 38/4. Orbits: Calcification along the left anterior globe as noted previously. No significant intraorbital abnormality observed. Prior tiny medial orbital wall fracture on the left is much less conspicuous today. Sinuses: Mildly expansile mucous retention cyst in the left maxillary sinus  with chronic bilateral maxillary sinusitis and mild chronic ethmoid sinusitis. Prior right maxillary antrectomy. Soft tissues: Abnormal subcutaneous edema/ soft tissue swelling overlying the right maxilla/ zygomatic arch, potentially from facial contusion, image 41/3. CT CERVICAL SPINE FINDINGS Alignment: 3 mm degenerative grade 1 anterolisthesis at C6-7 with 2 mm degenerative retrolisthesis at C4-5. Skull base and vertebrae: Prominent loss of articular space at the anterior C1-2 articulation with associated spurring. Fused facet joints bilaterally at C4-5 with extensive facet spurring. Endplate sclerosis and endplate irregularity at C5-6 with posterior osseous ridging. No cervical spine fracture identified. Soft tissues and spinal canal: Unremarkable Disc levels:  C2-3:  No impingement.  Bilateral facet arthropathy. C3-4: Moderate to prominent right and mild left foraminal impingement due to uncinate and facet spurring. C4-5: No definite osseous foraminal impingement. Bilateral facet arthropathy and right uncinate spurring. C5-6: Prominent right and moderate left osseous foraminal stenosis with mild central stenosis due to posterior osseous ridging, uncinate spurring, and facet arthropathy. C6-7:  No impingement.  Bilateral facet arthropathy. C7-T1:  Unremarkable. Upper chest: Unremarkable Other: No supplemental non-categorized findings. IMPRESSION: 1. CT head demonstrates chronic ischemic microvascular white matter disease but no other significant findings. 2. Maxillofacial CT demonstrates similar findings to the prior exam, including chronic ethmoid and maxillary sinusitis and a calcification along the left anterior globe. More notably, there is continued periapical lucency associated with the right maxillary canine and adjacent right maxillary premolar implant, and periapical abscess is not excluded. There is also a new subcutaneous edema along the right maxilla/zygomatic arch, potentially from facial contusion.  3. Cervical spine demonstrate spondylosis and degenerative disc disease causing prominent impingement at C5-6 and moderate to prominent impingement at C3-4. Electronically Signed   By: Van Clines M.D.   On: 08/24/2016 20:06   Dg Hand Complete Right  Result Date: 08/24/2016 CLINICAL DATA:  Multiple recent falls with pain, initial encounter EXAM: RIGHT HAND - COMPLETE 3+ VIEW COMPARISON:  None. FINDINGS: There is a comminuted fracture at the base of the fifth middle phalanx with mild impaction at the fracture site. Mild degenerative changes are noted the first [facility] joint as well as in the interphalangeal joints.  IMPRESSION: Fifth middle phalangeal fracture. Mild degenerative changes are seen. Electronically Signed   By: Inez Catalina M.D.   On: 08/24/2016 19:38   Ct Maxillofacial Wo Contrast  Result Date: 08/24/2016 CLINICAL DATA:  Per EMS pts wife called EMS stating pt has been falling constantly. Pt was in [facility] for rehab and placed on medication for high ammonia levels. According to wife he has stopped taking medications for last 3 weeks and has been heavily drinking. EMS reports abrasion to left forearms and redness noted on thoracic portion of back, no deformity notified. Pt alert and orientated to name and president, not to situation or place. Pt is verbally abusive,agitated and cu patient is noncooperative. 5 versed given en route. Wife states pt has been complaining of left sided hip pain for about a week, unsure if its related to falls. EXAM: CT HEAD WITHOUT CONTRAST CT MAXILLOFACIAL WITHOUT CONTRAST CT CERVICAL SPINE WITHOUT CONTRAST TECHNIQUE: Multidetector CT imaging of the head, cervical spine, and maxillofacial structures were performed using the standard protocol without intravenous contrast. Multiplanar CT image reconstructions of the cervical spine and maxillofacial structures were also generated. COMPARISON:  05/09/2016 FINDINGS: CT HEAD FINDINGS Brain: Periventricular white matter  and corona radiata hypodensities favor chronic ischemic microvascular white matter disease. The brainstem, cerebellum, cerebral peduncles, thalami, basal ganglia, basilar cisterns, and ventricular system appear within normal limits. No intracranial hemorrhage, mass lesion, or acute CVA. Vascular: There is atherosclerotic calcification of the cavernous carotid arteries bilaterally. Skull: Unremarkable Sinuses/Orbits: Deferred to facial CT Other: No supplemental non-categorized findings. CT MAXILLOFACIAL FINDINGS Osseous: Prior left medial orbital wall fracture shown on facial CT of 05/09/2016 is less conspicuous today. There is some calcification along the right anterior globe margin, not changed. No facial fracture identified. Periapical lucency associated with the right maxillary canine and adjacent right maxillary premolar implant, with demineralization of the maxilla for example on image 38/4. Orbits: Calcification along the left anterior globe as noted previously. No significant intraorbital abnormality observed. Prior tiny medial orbital wall fracture on the left is much less conspicuous today. Sinuses: Mildly expansile mucous retention cyst in the left maxillary sinus with chronic bilateral maxillary sinusitis and mild chronic ethmoid sinusitis. Prior right maxillary antrectomy. Soft tissues: Abnormal subcutaneous edema/ soft tissue swelling overlying the right maxilla/ zygomatic arch, potentially from facial contusion, image 41/3. CT CERVICAL SPINE FINDINGS Alignment: 3 mm degenerative grade 1 anterolisthesis at C6-7 with 2 mm degenerative retrolisthesis at C4-5. Skull base and vertebrae: Prominent loss of articular space at the anterior C1-2 articulation with associated spurring. Fused facet joints bilaterally at C4-5 with extensive facet spurring. Endplate sclerosis and endplate irregularity at C5-6 with posterior osseous ridging. No cervical spine fracture identified. Soft tissues and spinal canal:  Unremarkable Disc levels:  C2-3:  No impingement.  Bilateral facet arthropathy. C3-4: Moderate to prominent right and mild left foraminal impingement due to uncinate and facet spurring. C4-5: No definite osseous foraminal impingement. Bilateral facet arthropathy and right uncinate spurring. C5-6: Prominent right and moderate left osseous foraminal stenosis with mild central stenosis due to posterior osseous ridging, uncinate spurring, and facet arthropathy. C6-7:  No impingement.  Bilateral facet arthropathy. C7-T1:  Unremarkable. Upper chest: Unremarkable Other: No supplemental non-categorized findings. IMPRESSION: 1. CT head demonstrates chronic ischemic microvascular white matter disease but no other significant findings. 2. Maxillofacial CT demonstrates similar findings to the prior exam, including chronic ethmoid and maxillary sinusitis and a calcification along the left anterior globe. More notably, there is continued periapical lucency associated  with the right maxillary canine and adjacent right maxillary premolar implant, and periapical abscess is not excluded. There is also a new subcutaneous edema along the right maxilla/zygomatic arch, potentially from facial contusion. 3. Cervical spine demonstrate spondylosis and degenerative disc disease causing prominent impingement at C5-6 and moderate to prominent impingement at C3-4. Electronically Signed   By: Van Clines M.D.   On: 08/24/2016 20:06    Procedures Procedures (including critical care time)  Medications Ordered in ED Medications  sodium chloride 0.9 % bolus 1,000 mL (1,000 mLs Intravenous New Bag/Given 08/24/16 1959)    And  0.9 %  sodium chloride infusion ( Intravenous New Bag/Given 08/24/16 2000)  potassium chloride SA (K-DUR,KLOR-CON) CR tablet 60 mEq (not administered)     Initial Impression / Assessment and Plan / ED Course  I have reviewed the triage vital signs and the nursing notes.  Pertinent labs & imaging results that  were available during my care of the patient were reviewed by me and considered in my medical decision making (see chart for details).  Clinical Course    70 y.o. male here for multiple falls and alcohol abuse, per EMS wife states that pt has been having more falls, stopped taking ?lactulose and has been drinking heavily. He has been seen multiple times recently for alcohol intoxication and falls, last admission was 04/2016. On exam, has contusion to R cheek, old healing bruise to R hip, abrasion to L elbow and healed scabbed areas on both lower legs. Mild tenderness to C-spine region, so collar was left in place. No tenderness to T spine but has some redness to the area that appears as though he's been laying on his back for a bit. Pt A&O x3 but rambles and doesn't cooperate fully with exam so difficult to assess full neurologic status. Will get ammonia level, lactic, U/A, EtOH, UDS, INR, APTT, CMP, CBC, EKG, CT head/neck/maxillofacial and T-spine xray, R hand xray, and pelvis xray to ensure no injuries. Will give fluids and reassess shortly.  8:40 PM Ammonia level unremarkable. EtOH level 273. Lactic 3.48 likely from alcohol intoxication. INR and APTT WNL. CMP with mild hypokalemia 3.0 will replete here, otherwise unremarkable. CBC w/diff WNL. EKG without acute changes or ischemic findings although limited due to artifact. T-spine xray with mild degenerative changes but otherwise unremarkable. Pelvis xray neg. R hand xray shows 5th middle phalanx fx with mild impaction but no other acute findings, will splint finger.  CT head/neck/face with chronic unchanged sinusitis findings and chronic DDD of C-spine and spondylosis, and chronic microvascular changes without acute intracranial abnormalities. U/A and UDS not yet done, pt will provide urine sample now. Reassessed pt and he is still clinically intoxicated, not yet safe to d/c home, discussed findings with him and he seems to understand that he'll need to  have the finger splint on and f/up with hand specialist in the next 1wk, states the finger injury was 2-3 days ago. Still able to bend finger, and sensation intact. Discussed that he needs to stop drinking. Will sign out care to my attending Dr. Claudine Mouton at shift change, please see his notes for further documentation of care and dispo once clinically sober, able to ambulate, and UA/UDS results return. Pt stable at this time, denies any requests at this time.   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

## 2016-08-24 NOTE — ED Notes (Signed)
Py verbally abusive to staff. Requires lots of redirection. Can be redirected briefly. Pt continues to yell and is trying to remove self from backboard.

## 2016-09-13 ENCOUNTER — Emergency Department (HOSPITAL_COMMUNITY): Payer: Medicare Other

## 2016-09-13 ENCOUNTER — Encounter (HOSPITAL_COMMUNITY): Payer: Self-pay

## 2016-09-13 ENCOUNTER — Inpatient Hospital Stay (HOSPITAL_COMMUNITY)
Admission: EM | Admit: 2016-09-13 | Discharge: 2016-09-23 | DRG: 896 | Disposition: A | Discharge status: Skilled Nursing Facility | Payer: Medicare Other | Attending: Family Medicine | Admitting: Family Medicine

## 2016-09-13 DIAGNOSIS — Z9114 Patient's other noncompliance with medication regimen: Secondary | ICD-10-CM | POA: Diagnosis not present

## 2016-09-13 DIAGNOSIS — F10931 Alcohol use, unspecified with withdrawal delirium: Secondary | ICD-10-CM | POA: Diagnosis present

## 2016-09-13 DIAGNOSIS — R000 Tachycardia, unspecified: Secondary | ICD-10-CM | POA: Diagnosis not present

## 2016-09-13 DIAGNOSIS — D696 Thrombocytopenia, unspecified: Secondary | ICD-10-CM | POA: Diagnosis present

## 2016-09-13 DIAGNOSIS — D539 Nutritional anemia, unspecified: Secondary | ICD-10-CM | POA: Diagnosis present

## 2016-09-13 DIAGNOSIS — F10231 Alcohol dependence with withdrawal delirium: Principal | ICD-10-CM | POA: Diagnosis present

## 2016-09-13 DIAGNOSIS — F17210 Nicotine dependence, cigarettes, uncomplicated: Secondary | ICD-10-CM | POA: Diagnosis present

## 2016-09-13 DIAGNOSIS — K7030 Alcoholic cirrhosis of liver without ascites: Secondary | ICD-10-CM | POA: Diagnosis present

## 2016-09-13 DIAGNOSIS — M25552 Pain in left hip: Secondary | ICD-10-CM | POA: Diagnosis not present

## 2016-09-13 DIAGNOSIS — W19XXXA Unspecified fall, initial encounter: Secondary | ICD-10-CM | POA: Diagnosis present

## 2016-09-13 DIAGNOSIS — R296 Repeated falls: Secondary | ICD-10-CM | POA: Diagnosis present

## 2016-09-13 DIAGNOSIS — H9190 Unspecified hearing loss, unspecified ear: Secondary | ICD-10-CM | POA: Diagnosis present

## 2016-09-13 DIAGNOSIS — I2510 Atherosclerotic heart disease of native coronary artery without angina pectoris: Secondary | ICD-10-CM | POA: Diagnosis present

## 2016-09-13 DIAGNOSIS — R2689 Other abnormalities of gait and mobility: Secondary | ICD-10-CM | POA: Diagnosis not present

## 2016-09-13 DIAGNOSIS — R509 Fever, unspecified: Secondary | ICD-10-CM

## 2016-09-13 DIAGNOSIS — F411 Generalized anxiety disorder: Secondary | ICD-10-CM | POA: Diagnosis present

## 2016-09-13 DIAGNOSIS — W19XXXD Unspecified fall, subsequent encounter: Secondary | ICD-10-CM | POA: Diagnosis not present

## 2016-09-13 DIAGNOSIS — Z8249 Family history of ischemic heart disease and other diseases of the circulatory system: Secondary | ICD-10-CM

## 2016-09-13 DIAGNOSIS — Z833 Family history of diabetes mellitus: Secondary | ICD-10-CM

## 2016-09-13 DIAGNOSIS — Z8673 Personal history of transient ischemic attack (TIA), and cerebral infarction without residual deficits: Secondary | ICD-10-CM | POA: Diagnosis not present

## 2016-09-13 DIAGNOSIS — F329 Major depressive disorder, single episode, unspecified: Secondary | ICD-10-CM | POA: Diagnosis present

## 2016-09-13 DIAGNOSIS — J690 Pneumonitis due to inhalation of food and vomit: Secondary | ICD-10-CM | POA: Diagnosis not present

## 2016-09-13 DIAGNOSIS — J189 Pneumonia, unspecified organism: Secondary | ICD-10-CM | POA: Diagnosis not present

## 2016-09-13 DIAGNOSIS — Z800 Family history of malignant neoplasm of digestive organs: Secondary | ICD-10-CM | POA: Diagnosis not present

## 2016-09-13 DIAGNOSIS — Z8546 Personal history of malignant neoplasm of prostate: Secondary | ICD-10-CM | POA: Diagnosis not present

## 2016-09-13 DIAGNOSIS — I252 Old myocardial infarction: Secondary | ICD-10-CM | POA: Diagnosis not present

## 2016-09-13 DIAGNOSIS — Z8042 Family history of malignant neoplasm of prostate: Secondary | ICD-10-CM

## 2016-09-13 DIAGNOSIS — M25551 Pain in right hip: Secondary | ICD-10-CM | POA: Diagnosis not present

## 2016-09-13 DIAGNOSIS — I10 Essential (primary) hypertension: Secondary | ICD-10-CM | POA: Diagnosis present

## 2016-09-13 DIAGNOSIS — H9193 Unspecified hearing loss, bilateral: Secondary | ICD-10-CM | POA: Diagnosis not present

## 2016-09-13 DIAGNOSIS — R278 Other lack of coordination: Secondary | ICD-10-CM | POA: Diagnosis not present

## 2016-09-13 DIAGNOSIS — G8929 Other chronic pain: Secondary | ICD-10-CM | POA: Diagnosis present

## 2016-09-13 DIAGNOSIS — E876 Hypokalemia: Secondary | ICD-10-CM | POA: Diagnosis not present

## 2016-09-13 DIAGNOSIS — M6281 Muscle weakness (generalized): Secondary | ICD-10-CM | POA: Diagnosis not present

## 2016-09-13 LAB — CBC WITH DIFFERENTIAL/PLATELET
BASOS ABS: 0 10*3/uL (ref 0.0–0.1)
Basophils Relative: 1 %
EOS PCT: 1 %
Eosinophils Absolute: 0 10*3/uL (ref 0.0–0.7)
HEMATOCRIT: 32.7 % — AB (ref 39.0–52.0)
HEMOGLOBIN: 11.7 g/dL — AB (ref 13.0–17.0)
LYMPHS ABS: 1.2 10*3/uL (ref 0.7–4.0)
LYMPHS PCT: 25 %
MCH: 34.2 pg — AB (ref 26.0–34.0)
MCHC: 35.8 g/dL (ref 30.0–36.0)
MCV: 95.6 fL (ref 78.0–100.0)
Monocytes Absolute: 0.4 10*3/uL (ref 0.1–1.0)
Monocytes Relative: 7 %
NEUTROS ABS: 3.3 10*3/uL (ref 1.7–7.7)
NEUTROS PCT: 66 %
PLATELETS: 151 10*3/uL (ref 150–400)
RBC: 3.42 MIL/uL — AB (ref 4.22–5.81)
RDW: 17.3 % — ABNORMAL HIGH (ref 11.5–15.5)
WBC: 5 10*3/uL (ref 4.0–10.5)

## 2016-09-13 LAB — MAGNESIUM: MAGNESIUM: 1.3 mg/dL — AB (ref 1.7–2.4)

## 2016-09-13 LAB — RAPID URINE DRUG SCREEN, HOSP PERFORMED
AMPHETAMINES: NOT DETECTED
BENZODIAZEPINES: POSITIVE — AB
Barbiturates: NOT DETECTED
Cocaine: NOT DETECTED
Opiates: NOT DETECTED
TETRAHYDROCANNABINOL: NOT DETECTED

## 2016-09-13 LAB — COMPREHENSIVE METABOLIC PANEL
ALT: 66 U/L — AB (ref 17–63)
ANION GAP: 11 (ref 5–15)
AST: 116 U/L — ABNORMAL HIGH (ref 15–41)
Albumin: 3.9 g/dL (ref 3.5–5.0)
Alkaline Phosphatase: 92 U/L (ref 38–126)
BUN: 10 mg/dL (ref 6–20)
CHLORIDE: 105 mmol/L (ref 101–111)
CO2: 24 mmol/L (ref 22–32)
CREATININE: 0.57 mg/dL — AB (ref 0.61–1.24)
Calcium: 9.1 mg/dL (ref 8.9–10.3)
Glucose, Bld: 102 mg/dL — ABNORMAL HIGH (ref 65–99)
POTASSIUM: 3.1 mmol/L — AB (ref 3.5–5.1)
SODIUM: 140 mmol/L (ref 135–145)
Total Bilirubin: 1.7 mg/dL — ABNORMAL HIGH (ref 0.3–1.2)
Total Protein: 6.7 g/dL (ref 6.5–8.1)

## 2016-09-13 LAB — ETHANOL

## 2016-09-13 LAB — MRSA PCR SCREENING: MRSA BY PCR: NEGATIVE

## 2016-09-13 LAB — AMMONIA: AMMONIA: 41 umol/L — AB (ref 9–35)

## 2016-09-13 MED ORDER — ONDANSETRON HCL 4 MG/2ML IJ SOLN
4.0000 mg | Freq: Three times a day (TID) | INTRAMUSCULAR | Status: DC | PRN
Start: 2016-09-13 — End: 2016-09-23

## 2016-09-13 MED ORDER — ADULT MULTIVITAMIN W/MINERALS CH
1.0000 | ORAL_TABLET | Freq: Every day | ORAL | Status: DC
  Administered 2016-09-14 – 2016-09-23 (×10): 1 via ORAL
  Filled 2016-09-13 (×10): qty 1

## 2016-09-13 MED ORDER — SODIUM CHLORIDE 0.9 % IV SOLN
INTRAVENOUS | Status: DC
  Administered 2016-09-13 – 2016-09-22 (×13): via INTRAVENOUS

## 2016-09-13 MED ORDER — POTASSIUM CHLORIDE CRYS ER 20 MEQ PO TBCR
40.0000 meq | EXTENDED_RELEASE_TABLET | Freq: Once | ORAL | Status: AC
  Administered 2016-09-13: 40 meq via ORAL
  Filled 2016-09-13: qty 2

## 2016-09-13 MED ORDER — ASPIRIN EC 81 MG PO TBEC
81.0000 mg | DELAYED_RELEASE_TABLET | Freq: Every day | ORAL | Status: DC
Start: 2016-09-13 — End: 2016-09-23
  Administered 2016-09-13 – 2016-09-23 (×11): 81 mg via ORAL
  Filled 2016-09-13 (×11): qty 1

## 2016-09-13 MED ORDER — LORAZEPAM 2 MG/ML IJ SOLN
1.0000 mg | Freq: Once | INTRAMUSCULAR | Status: AC
  Administered 2016-09-13: 1 mg via INTRAVENOUS
  Filled 2016-09-13: qty 1

## 2016-09-13 MED ORDER — ENSURE ENLIVE PO LIQD
237.0000 mL | Freq: Two times a day (BID) | ORAL | Status: DC
  Administered 2016-09-14 – 2016-09-23 (×17): 237 mL via ORAL

## 2016-09-13 MED ORDER — SODIUM CHLORIDE 0.9 % IV SOLN
INTRAVENOUS | Status: DC
  Administered 2016-09-13: 14:00:00 via INTRAVENOUS

## 2016-09-13 MED ORDER — LORAZEPAM 2 MG/ML IJ SOLN
0.0000 mg | Freq: Four times a day (QID) | INTRAMUSCULAR | Status: DC
Start: 2016-09-13 — End: 2016-09-13
  Administered 2016-09-13: 2 mg via INTRAVENOUS
  Filled 2016-09-13: qty 1

## 2016-09-13 MED ORDER — LORAZEPAM 2 MG/ML IJ SOLN: 0.0000 mg | Freq: Two times a day (BID) | INTRAMUSCULAR | Status: DC

## 2016-09-13 MED ORDER — MORPHINE SULFATE (PF) 2 MG/ML IV SOLN
2.0000 mg | INTRAVENOUS | Status: DC | PRN
  Administered 2016-09-13 – 2016-09-21 (×23): 2 mg via INTRAVENOUS
  Filled 2016-09-13 (×23): qty 1

## 2016-09-13 MED ORDER — FOLIC ACID 5 MG/ML IJ SOLN
1.0000 mg | Freq: Every day | INTRAMUSCULAR | Status: DC
  Administered 2016-09-14: 1 mg via INTRAVENOUS
  Filled 2016-09-13 (×2): qty 0.2

## 2016-09-13 MED ORDER — SODIUM CHLORIDE 0.9% FLUSH
3.0000 mL | Freq: Two times a day (BID) | INTRAVENOUS | Status: DC
  Administered 2016-09-14 – 2016-09-19 (×8): 3 mL via INTRAVENOUS

## 2016-09-13 MED ORDER — LORAZEPAM 2 MG/ML IJ SOLN
2.0000 mg | INTRAMUSCULAR | Status: DC | PRN
  Administered 2016-09-13 – 2016-09-14 (×12): 2 mg via INTRAVENOUS
  Administered 2016-09-14: 3 mg via INTRAVENOUS
  Administered 2016-09-14 – 2016-09-15 (×7): 2 mg via INTRAVENOUS
  Administered 2016-09-15 (×3): 3 mg via INTRAVENOUS
  Administered 2016-09-16 (×2): 2 mg via INTRAVENOUS
  Administered 2016-09-16: 3 mg via INTRAVENOUS
  Administered 2016-09-16 – 2016-09-18 (×5): 2 mg via INTRAVENOUS
  Administered 2016-09-19: 3 mg via INTRAVENOUS
  Administered 2016-09-19: 2 mg via INTRAVENOUS
  Administered 2016-09-20: 3 mg via INTRAVENOUS
  Filled 2016-09-13: qty 2
  Filled 2016-09-13 (×4): qty 1
  Filled 2016-09-13: qty 2
  Filled 2016-09-13 (×4): qty 1
  Filled 2016-09-13: qty 2
  Filled 2016-09-13 (×2): qty 1
  Filled 2016-09-13: qty 2
  Filled 2016-09-13 (×7): qty 1
  Filled 2016-09-13: qty 2
  Filled 2016-09-13: qty 1
  Filled 2016-09-13: qty 2
  Filled 2016-09-13 (×3): qty 1
  Filled 2016-09-13: qty 2
  Filled 2016-09-13 (×9): qty 1

## 2016-09-13 MED ORDER — THIAMINE HCL 100 MG/ML IJ SOLN
100.0000 mg | Freq: Once | INTRAMUSCULAR | Status: AC
  Administered 2016-09-13: 100 mg via INTRAVENOUS
  Filled 2016-09-13: qty 2

## 2016-09-13 MED ORDER — THIAMINE HCL 100 MG/ML IJ SOLN
100.0000 mg | Freq: Every day | INTRAMUSCULAR | Status: DC
  Administered 2016-09-14: 100 mg via INTRAVENOUS
  Filled 2016-09-13: qty 2

## 2016-09-13 MED ORDER — MAGNESIUM SULFATE 2 GM/50ML IV SOLN
2.0000 g | Freq: Once | INTRAVENOUS | Status: AC
  Administered 2016-09-13: 2 g via INTRAVENOUS
  Filled 2016-09-13: qty 50

## 2016-09-13 MED ORDER — HYDRALAZINE HCL 20 MG/ML IJ SOLN: 5.0000 mg | Freq: Four times a day (QID) | INTRAMUSCULAR | Status: DC | PRN

## 2016-09-13 MED ORDER — HEPARIN SODIUM (PORCINE) 5000 UNIT/ML IJ SOLN
5000.0000 [IU] | Freq: Three times a day (TID) | INTRAMUSCULAR | Status: DC
  Administered 2016-09-13 – 2016-09-23 (×29): 5000 [IU] via SUBCUTANEOUS
  Filled 2016-09-13 (×29): qty 1

## 2016-09-13 NOTE — ED Notes (Signed)
Patient transported to X-ray 

## 2016-09-13 NOTE — ED Notes (Signed)
MD at bedside. EDP KNAPP PRESENT TO SPEAK TO PT AND WIFE. AWARE OF ADMISSION

## 2016-09-13 NOTE — ED Notes (Signed)
Pt denies ETOH usage upon arrival to ED. Pt admits to drinking 2 days ago. Pt states here for pain medication. Pt reports falling this AM. ? Hip pain post fall. Pt reports "hurts all the time". Pt states he gets shots however no pills. Pt appears in no distress.

## 2016-09-13 NOTE — H&P (Signed)
History and Physical    MARTINIANO HUE [id number] DOB: 06-Feb-1946 DOA: 09/13/2016  PCP: Mayra Neer, MD Patient coming from: Home  Chief Complaint: Fall  HPI: Joshua Henson is a 70 y.o. male with medical history significant of alcohol abuse, stroke, and STEMI, hypertension, depression, anxiety, alcohol cirrhosis, hepatic encephalopathy. Patient reports that early this morning around 4 AM, he fell down backwards. He reports no the time. His girlfriend found him at around 6:00 AM and wanted to bring him to the emergency room. He refused at that time. Symptoms of weakness continued and girlfriend eventually can since patient to be evaluated in the emergency department. Patient drinks liquor daily. Last drink was yesterday. According to the EDP report, patient's girlfriend has been diluting his alcohol.  ED Course: Vitals: Tachycardia, hypertension Labs: Hemoglobin of 11.7, potassium of 2.1, magnesium 1.3, elevated AST of 116 and ALT of 66. Ammonia of 41 ImaCT head was negative for acute hemorrhage Medications/Course: Potassium repleted, Ativan 1 mg, thiamine given  Review of Systems: Review of Systems  Constitutional: Negative for chills and fever.  Respiratory: Negative for cough, sputum production, shortness of breath and wheezing.   Cardiovascular: Negative for chest pain and palpitations.  Gastrointestinal: Negative for abdominal pain, blood in stool, constipation, diarrhea, nausea and vomiting.  Neurological: Positive for tremors and loss of consciousness.  All other systems reviewed and are negative.   Past Medical History:  Diagnosis Date  . Acute hepatic encephalopathy ([place])   . Alcoholic cirrhosis of liver without ascites ([place])   . Anxiety state   . CAD (coronary artery disease), native coronary artery 01/10/2016   3 vessel coronary calcification noted on CT scan 04/03/15    . Depression   . Fatty liver, alcoholic [id number]  . Hypertension   . Increased ammonia level     . Insomnia   . NSTEMI (non-ST elevated myocardial infarction) ([place])   . Prostate cancer ([place]) 2010   External beam radiation (urol - Risa Grill, XRT Valere Dross)  . Stroke ([place])   . Tachycardia     Past Surgical History:  Procedure Laterality Date  . CARDIAC CATHETERIZATION N/A 01/15/2016   Procedure: Left Heart Cath and Coronary Angiography;  Surgeon: Burnell Blanks, MD;  Location: [facility];  Service: Cardiovascular;  Laterality: N/A;  . none       reports that he has been smoking Cigarettes.  He has a 15.00 pack-year smoking history. He has never used smokeless tobacco. He reports that he drinks about 33.6 oz of alcohol per week . He reports that he does not use drugs.  Allergies  Allergen Reactions  . Lisinopril     syncope  . Celebrex [Celecoxib] Rash    Family History  Problem Relation Age of Onset  . Cancer Mother     esophageal  . Hypertension Mother   . Diabetes Mother   . Cancer Father     prostate  . Heart disease Maternal Grandfather     MI  . Cancer Paternal Grandfather     prostate    Prior to Admission medications   Medication Sig Start Date End Date Taking? Authorizing Provider  ALPRAZolam Duanne Moron) 0.5 MG tablet Take 0.75 mg by mouth at bedtime.   Yes Historical Provider, MD  aspirin 81 MG EC tablet Take 1 tablet (81 mg total) by mouth daily. 07/11/14  Yes Daniel J Angiulli, PA-C  atorvastatin (LIPITOR) 40 MG tablet Take 1 tablet (40 mg total) by mouth daily at 6 PM.  01/19/16  Yes Allie Bossier, MD  buPROPion [facility] SR) 100 MG 12 hr tablet Take 200 mg by mouth daily.  04/19/16  Yes Historical Provider, MD  carvedilol (COREG) 6.25 MG tablet Take 6.25 mg by mouth 2 (two) times daily with a meal.   Yes Historical Provider, MD  cholecalciferol (VITAMIN D) 1000 units tablet Take 1,000 Units by mouth 2 (two) times daily.    Yes Historical Provider, MD  citalopram (CELEXA) 20 MG tablet Take 20 mg by mouth daily.   Yes Historical Provider, MD   Coenzyme Q10 (CO Q-10) 200 MG CAPS Take 200 mg by mouth daily.   Yes Historical Provider, MD  folic acid (FOLVITE) 1 MG tablet Take 1 tablet (1 mg total) by mouth daily. 07/11/14  Yes Daniel J Angiulli, PA-C  LORazepam (ATIVAN) 1 MG tablet Take 1 mg by mouth 2 (two) times daily as needed for anxiety.   Yes Historical Provider, MD  Magnesium 500 MG CAPS Take 500 mg by mouth at bedtime.   Yes Historical Provider, MD  nitroGLYCERIN (NITROSTAT) 0.4 MG SL tablet Place 1 tablet (0.4 mg total) under the tongue every 5 (five) minutes as needed for chest pain. 05/13/16  Yes Velvet Bathe, MD  Omega-3 Fatty Acids (FISH OIL) 1000 MG CAPS Take 1,000 mg by mouth 2 (two) times daily.    Yes Historical Provider, MD  potassium chloride SA (K-DUR,KLOR-CON) 20 MEQ tablet Take 20 mEq by mouth 2 (two) times daily.   Yes Historical Provider, MD  tamsulosin (FLOMAX) 0.4 MG CAPS capsule Take 0.4 mg by mouth daily.  11/25/14  Yes Historical Provider, MD  thiamine (VITAMIN B-1) 100 MG tablet Take 100 mg by mouth daily.   Yes Historical Provider, MD  acetaminophen (TYLENOL) 325 MG tablet Take 2 tablets (650 mg total) by mouth every 6 (six) hours as needed for mild pain (or Fever >/= 101). Patient not taking: Reported on 09/13/2016 05/13/16   Velvet Bathe, MD  carvedilol (COREG) 12.5 MG tablet Take 1 tablet (12.5 mg total) by mouth 2 (two) times daily with a meal. Patient not taking: Reported on 09/13/2016 05/13/16   Velvet Bathe, MD  hydrOXYzine (ATARAX/VISTARIL) 10 MG tablet Take 1 tablet (10 mg total) by mouth 3 (three) times daily as needed for anxiety. Patient not taking: Reported on 09/13/2016 05/13/16   Velvet Bathe, MD  Multiple Vitamin (MULTIVITAMIN WITH MINERALS) TABS tablet Take 1 tablet by mouth daily. Patient not taking: Reported on 09/13/2016 05/13/16   Velvet Bathe, MD    Physical Exam: Vitals:   09/13/16 1411 09/13/16 1506 09/13/16 1559 09/13/16 1600  BP: (!) 155/113 (!) 154/103  157/94  Pulse: 112 110  106   Resp: 14 14  25   Temp:      TempSrc:      SpO2: 98% 98%    Weight:   81.6 kg (180 lb)      Constitutional: NAD, agitated, disheveled  Eyes: PERRL, lids and conjunctivae normal, anicteric ENMT: Mucous membranes are moist. Posterior pharynx clear of any exudate or lesions.Normal dentition.  Neck: normal, supple, no masses, no thyromegaly Respiratory: clear to auscultation bilaterally, no wheezing, no crackles. Normal respiratory effort. No accessory muscle use.  Cardiovascular: Regular rate and rhythm, no murmurs / rubs / gallops. No extremity edema. 2+ pedal pulses Abdomen: no tenderness, no masses palpated. No hepatosplenomegaly. Bowel sounds positive.  Musculoskeletal: no clubbing / cyanosis. No joint deformity upper and lower extremities. Good ROM, no contractures. Normal muscle tone.  Skin:  multiple small ecchymotic rashes. Large scab on right knee Neurologic: CN 2-12 grossly intact. Sensation intact, DTR normal. Strength 5/5 in all 4. Steady tremor of bilateral hands. No asterixis Psychiatric: Impaired judgment and insight. Alert and oriented x 3. Depressed mood.   Labs on Admission: I have personally reviewed following labs and imaging studies  CBC:  Recent Labs Lab 09/13/16 1414  WBC 5.0  NEUTROABS 3.3  HGB 11.7*  HCT 32.7*  MCV 95.6  PLT 123XX123   Basic Metabolic Panel:  Recent Labs Lab 09/13/16 1414  NA 140  K 3.1*  CL 105  CO2 24  GLUCOSE 102*  BUN 10  CREATININE 0.57*  CALCIUM 9.1  MG 1.3*   GFR: Estimated Creatinine Clearance: 91.3 mL/min (by C-G formula based on SCr of 0.57 mg/dL (L)). Liver Function Tests:  Recent Labs Lab 09/13/16 1414  AST 116*  ALT 66*  ALKPHOS 92  BILITOT 1.7*  PROT 6.7  ALBUMIN 3.9   No results for input(s): LIPASE, AMYLASE in the last 168 hours.  Recent Labs Lab 09/13/16 1426  AMMONIA 41*   Coagulation Profile: No results for input(s): INR, PROTIME in the last 168 hours. Cardiac Enzymes: No results for  input(s): CKTOTAL, CKMB, CKMBINDEX, TROPONINI in the last 168 hours. BNP (last 3 results) No results for input(s): PROBNP in the last 8760 hours. HbA1C: No results for input(s): HGBA1C in the last 72 hours. CBG: No results for input(s): GLUCAP in the last 168 hours. Lipid Profile: No results for input(s): CHOL, HDL, LDLCALC, TRIG, CHOLHDL, LDLDIRECT in the last 72 hours. Thyroid Function Tests: No results for input(s): TSH, T4TOTAL, FREET4, T3FREE, THYROIDAB in the last 72 hours. Anemia Panel: No results for input(s): VITAMINB12, FOLATE, FERRITIN, TIBC, IRON, RETICCTPCT in the last 72 hours. Urine analysis:    Component Value Date/Time   COLORURINE AMBER (A) 08/24/2016 1830   APPEARANCEUR CLEAR 08/24/2016 1830   LABSPEC 1.019 08/24/2016 1830   PHURINE 7.0 08/24/2016 1830   GLUCOSEU NEGATIVE 08/24/2016 1830   HGBUR NEGATIVE 08/24/2016 1830   BILIRUBINUR SMALL (A) 08/24/2016 1830   KETONESUR NEGATIVE 08/24/2016 1830   PROTEINUR NEGATIVE 08/24/2016 1830   UROBILINOGEN 2.0 (H) 04/02/2015 2013   NITRITE NEGATIVE 08/24/2016 1830   LEUKOCYTESUR NEGATIVE 08/24/2016 1830    Radiological Exams on Admission: Ct Head Wo Contrast  Result Date: 09/13/2016 CLINICAL DATA:  Frequent falls. History of alcohol abuse and Meniere's disease EXAM: CT HEAD WITHOUT CONTRAST TECHNIQUE: Contiguous axial images were obtained from the base of the skull through the vertex without intravenous contrast. COMPARISON:  08/26/2016 FINDINGS: Brain: Mild superficial and moderate central atrophy with sulcal and ventricular prominence unchanged in appearance. Chronic appearing small vessel ischemic disease characterized by. Ventricular and coronal radiata hypodensity is again noted. No large vessel territory infarction, acute intracranial mass, hemorrhage, edema or midline shift. Vascular: Atherosclerotic calcifications of the cavernous carotid arteries bilaterally. Skull: No acute osseous abnormality. Sinuses/Orbits:  Stable chronic left anterior lobe calcifications unchanged in appearance. Chronic left lamina papyracea fracture deformity. The this retention cyst of the left maxillary sinus. Right maxillary sinus mucosal thickening with hypertrophic changes of the maxillary sinus walls consistent chronic sinusitis. Other: None IMPRESSION: 1. 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 Stable cerebral atrophy. 2. No acute intracranial abnormality. 3. Chronic fracture deformity of the left lamina papyracea. 4. Chronic right maxillary sinusitis. Left maxillary mucous retention cysts. Electronically Signed   By: Ashley Royalty M.D.   On: 09/13/2016 14:14   Dg Hips  Bilat With Pelvis 3-4 Views  Result Date: 09/13/2016 CLINICAL DATA:  Multiple falls.  Bilateral hip pain. EXAM: DG HIP (WITH OR WITHOUT PELVIS) 3-4V BILAT COMPARISON:  08/24/2016 pelvic radiograph. FINDINGS: No fracture or diastasis in the pelvis. No hip fracture or dislocation. 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 Fiducial markers overlie the prostate. Atherosclerotic change in the internal iliac arteries. No suspicious focal osseous lesion. IMPRESSION: No fracture.  No hip dislocation. Electronically Signed   By: Ilona Sorrel M.D.   On: 09/13/2016 14:01    Assessment/Plan Active Problems:   Hearing loss   Essential hypertension   Alcohol withdrawal delirium (HCC)   Alcohol withdrawal Alcohol abuse CIWA scores of 11 in the emergency department. Patient appears to be withdrawing mildly. Blood pressure elevated, tachycardic.  -Continue CIWA protocol -continue thiamine, folic acid, multivitamin -Social worker consult  Hypertension will hold beta blocker since patient is not taking and so as not to mask withdrawal symptoms -Hydralazine when necessary  CAD -Continue aspirin  Cirrhosis Ammonia mildly elevated. Patient is overall not confused although cannot get a real baseline. No  thrombocytopenia. Mild anemia that is stable. No evidence of bleeding. -INR -avoid hepatotoxic agents  Depression Patient speaks very poorly of himself. He reports "not being worth [anything]." He is prescribed antidepressants at home but does not take them. He reports no gun at home (he used to have some but his friend took them away three years ago for fear he would hurt himself). -will hold off on restarting antidepressants as he will need to be adherent with regimen -not actively suicidal but may benefit from psychiatry consult  DVT prophylaxis:  Heparin Code Status: Full code Family Communication: None at bedside Disposition Plan: Discharge home in 3-5 days Consults called: None Admission status: Inpatient, SDU   Cordelia Poche, MD [facility] Pager [phone]  If 7PM-7AM, please contact night-coverage [email or web address] Password [facility]  09/13/2016, 5:51 PM

## 2016-09-13 NOTE — Progress Notes (Signed)
Received patient from ED, VS obtained, skin assessment completed, call light placed in reach

## 2016-09-13 NOTE — BH Assessment (Signed)
TTS consult cancelled. Per EDP-Dr. Tomi Bamberger this patient does not need a TTS consult.

## 2016-09-13 NOTE — ED Triage Notes (Signed)
Per [facility]- Pt states having frequent falls. Pt attempting to detox off of ETOH. Last drink 12 hours ago. HX of delirium. Girlfriend had been diluting ETOH with water without pt's awareness. HTN. Pt is non complaint with medications. Pt here for DETOX and frequent falls (falls over the last 3 days). STROKE NEG. Neuro intact. Hallucinations or delusions at present. No traumas noted post falls. Pt is calm and cooperative with care.

## 2016-09-13 NOTE — ED Notes (Addendum)
Pt asleep. Eye closed with even respirations. HR 106, RR16,

## 2016-09-13 NOTE — ED Notes (Signed)
PT IS UNABLE TO COLLECT URINE SAMPLE

## 2016-09-13 NOTE — ED Notes (Signed)
MD at bedside. 

## 2016-09-13 NOTE — ED Notes (Signed)
MD at bedside. ADMISSION MD PRESENT SPEAKING WITH PT

## 2016-09-13 NOTE — ED Provider Notes (Signed)
[facility] DEPT Provider Note   CSN: [id number] Arrival date & time: 09/13/16  1214     History   Chief Complaint Chief Complaint  Patient presents with  . Alcohol Problem  . Delirium Tremens (DTS)  . Fall  . Hypertension    HPI Joshua Henson is a 70 y.o. male.  HPI Patient presents to the emergency room for evaluation of frequent falls. Patient has a history of alcoholism, Mnire's disease, and several other medical problems.  The patient has been seen several times in the past for similar symptoms. Patient was last seen in the emergency room on September 6 of this year. The patient has been admitted to the hospital in the past for hepatic encephalopathy. He was also in a skilled nursing facility to rehabilitation previously. When I ask him about what brought him to the emergency room the patient tells me about his history of Mnire's disease his career as a Chief Executive Officer and initially did not mention his alcohol abuse. When I specifically asked about drinking he mentioned to me that he quit several years ago when he was diagnosed with Mnire's disease. When I asked him if he is drinking now he does admit that he drinks currently and his last drink was yesterday. Denies any acute pain. When I asked him about whether he wants to stop drinking alcohol he says that he does have an interest is not really sure if he wants to stop. Patient mentioned hip pain to the nurse although he did not mention that to me. Past Medical History:  Diagnosis Date  . Acute hepatic encephalopathy ([place])   . Alcoholic cirrhosis of liver without ascites ([place])   . Anxiety state   . CAD (coronary artery disease), native coronary artery 01/10/2016   3 vessel coronary calcification noted on CT scan 04/03/15    . Depression   . Fatty liver, alcoholic [id number]  . Hypertension   . Increased ammonia level   . Insomnia   . NSTEMI (non-ST elevated myocardial infarction) ([place])   . Prostate cancer ([place]) 2010   External  beam radiation (urol - Risa Grill, XRT Valere Dross)  . Stroke ([place])   . Tachycardia     Patient Active Problem List   Diagnosis Date Noted  . Left upper quadrant pain 05/10/2016  . Encephalopathy 05/09/2016  . C. difficile colitis 03/30/2016  . Acute kidney injury ([place]) 03/30/2016  . Essential hypertension 03/23/2016  . Fall   . Tobacco abuse   . Hypokalemia   . Hypomagnesemia   . Hypophosphatemia   . CAP (community acquired pneumonia) due to MSSA (methicillin sensitive Staphylococcus aureus) ([place])   . CAD in native artery   . Cocaine abuse   . Alcohol abuse with intoxication ([place]) 01/11/2016  . Frequent falls 01/11/2016  . Left rib fracture 01/11/2016  . Acute encephalopathy 01/11/2016  . Polysubstance abuse 01/11/2016  . Delirium tremens ([place]) 01/11/2016  . NSTEMI (non-ST elevated myocardial infarction) ([place]) 01/11/2016  . Sepsis due to Enterococcus with acute renal failure and metabolic encephalopathy ([place]) 01/11/2016  . Alcohol intoxication ([place])   . Chronic alcohol abuse   . Acute respiratory failure with hypoxia ([place])   . Elevated troponin 01/10/2016  . Chest pain 01/10/2016  . CAD (coronary artery disease), native coronary artery 01/10/2016  . Fatty liver, alcoholic [id number]  . Tremor 04/02/2015  . Ataxia   . Alcohol withdrawal ([place]) 09/03/2014  . Unspecified cerebral artery occlusion with cerebral infarction 07/05/2014  . Alcohol abuse 07/02/2014  .  Current tobacco use 01/17/2014  . Hearing loss 11/25/2013  . Tinnitus of both ears 11/25/2013    Past Surgical History:  Procedure Laterality Date  . CARDIAC CATHETERIZATION N/A 01/15/2016   Procedure: Left Heart Cath and Coronary Angiography;  Surgeon: Burnell Blanks, MD;  Location: [facility];  Service: Cardiovascular;  Laterality: N/A;  . none         Home Medications    Prior to Admission medications   Medication Sig Start Date End Date Taking? Authorizing Provider  acetaminophen (TYLENOL)  325 MG tablet Take 2 tablets (650 mg total) by mouth every 6 (six) hours as needed for mild pain (or Fever >/= 101). 05/13/16   Velvet Bathe, MD  aspirin 81 MG EC tablet Take 1 tablet (81 mg total) by mouth daily. 07/11/14   Lavon Paganini Angiulli, PA-C  atorvastatin (LIPITOR) 40 MG tablet Take 1 tablet (40 mg total) by mouth daily at 6 PM. 01/19/16   Allie Bossier, MD  buPROPion [facility] SR) 100 MG 12 hr tablet Take 200 mg by mouth daily.  04/19/16   Historical Provider, MD  carvedilol (COREG) 12.5 MG tablet Take 1 tablet (12.5 mg total) by mouth 2 (two) times daily with a meal. 05/13/16   Velvet Bathe, MD  cholecalciferol (VITAMIN D) 1000 units tablet Take 2,000 Units by mouth daily.    Historical Provider, MD  citalopram (CELEXA) 20 MG tablet Take 20 mg by mouth daily.    Historical Provider, MD  Coenzyme Q10 (CO Q-10) 200 MG CAPS Take 200 mg by mouth daily.    Historical Provider, MD  folic acid (FOLVITE) 1 MG tablet Take 1 tablet (1 mg total) by mouth daily. 07/11/14   Lavon Paganini Angiulli, PA-C  hydrOXYzine (ATARAX/VISTARIL) 10 MG tablet Take 1 tablet (10 mg total) by mouth 3 (three) times daily as needed for anxiety. 05/13/16   Velvet Bathe, MD  lactulose (CHRONULAC) 10 GM/15ML solution Take by mouth 3 (three) times daily. Give 45 mL. Hold if having 3 BMs/day    Historical Provider, MD  LORazepam (ATIVAN) 0.5 MG tablet Take 0.5 mg by mouth 2 (two) times daily as needed for anxiety.    Historical Provider, MD  Magnesium 500 MG CAPS Take 500 mg by mouth at bedtime.    Historical Provider, MD  Multiple Vitamin (MULTIVITAMIN WITH MINERALS) TABS tablet Take 1 tablet by mouth daily. 05/13/16   Velvet Bathe, MD  nitroGLYCERIN (NITROSTAT) 0.4 MG SL tablet Place 1 tablet (0.4 mg total) under the tongue every 5 (five) minutes as needed for chest pain. 05/13/16   Velvet Bathe, MD  Omega-3 Fatty Acids (FISH OIL) 1000 MG CAPS Take 1,000 mg by mouth 2 (two) times daily.     Historical Provider, MD  Protein (PROCEL 100)  POWD Take 1 scoop by mouth 2 (two) times daily.    Historical Provider, MD  tamsulosin (FLOMAX) 0.4 MG CAPS capsule Take 0.4 mg by mouth daily.  11/25/14   Historical Provider, MD  thiamine (VITAMIN B-1) 100 MG tablet Take 100 mg by mouth daily.    Historical Provider, MD    Family History Family History  Problem Relation Age of Onset  . Cancer Mother     esophageal  . Hypertension Mother   . Diabetes Mother   . Cancer Father     prostate  . Heart disease Maternal Grandfather     MI  . Cancer Paternal Grandfather     prostate    Social  History Social History  Substance Use Topics  . Smoking status: Current Every Day Smoker    Packs/day: 0.50    Years: 30.00    Types: Cigarettes  . Smokeless tobacco: Never Used  . Alcohol use 33.6 oz/week    56 Shots of liquor per week     Comment: last drink yesterday     Allergies   Lisinopril and Celebrex [celecoxib]   Review of Systems Review of Systems  All other systems reviewed and are negative.    Physical Exam Updated Vital Signs BP (!) 154/103 (BP Location: Left Arm)   Pulse 110   Temp 98.1 F (36.7 C) (Oral)   Resp 14   Wt 81.6 kg   SpO2 98%   BMI 25.83 kg/m   Physical Exam  Constitutional: He is oriented to person, place, and time. No distress.  Disheveled  HENT:  Head: Normocephalic and atraumatic.  Right Ear: External ear normal.  Left Ear: External ear normal.  Eyes: Conjunctivae are normal. Right eye exhibits no discharge. Left eye exhibits no discharge. No scleral icterus.  Neck: Neck supple. No tracheal deviation present.  Cardiovascular: Normal rate, regular rhythm and intact distal pulses.   Pulmonary/Chest: Effort normal and breath sounds normal. No stridor. No respiratory distress. He has no wheezes. He has no rales.  Abdominal: Soft. Bowel sounds are normal. He exhibits no distension. There is no tenderness. There is no rebound and no guarding.  Musculoskeletal: He exhibits no edema or  tenderness.  Neurological: He is alert and oriented to person, place, and time. He has normal strength. He displays tremor. No cranial nerve deficit (no facial droop, extraocular movements intact, no slurred speech) or sensory deficit. He exhibits normal muscle tone. He displays no seizure activity. Coordination normal.  Skin: Skin is warm and dry. No rash noted.  Psychiatric: He has a normal mood and affect.  Nursing note and vitals reviewed.    ED Treatments / Results  Labs (all labs ordered are listed, but only abnormal results are displayed) Labs Reviewed  COMPREHENSIVE METABOLIC PANEL - Abnormal; Notable for the following:       Result Value   Potassium 3.1 (*)    Glucose, Bld 102 (*)    Creatinine, Ser 0.57 (*)    AST 116 (*)    ALT 66 (*)    Total Bilirubin 1.7 (*)    All other components within normal limits  CBC WITH DIFFERENTIAL/PLATELET - Abnormal; Notable for the following:    RBC 3.42 (*)    Hemoglobin 11.7 (*)    HCT 32.7 (*)    MCH 34.2 (*)    RDW 17.3 (*)    All other components within normal limits  AMMONIA - Abnormal; Notable for the following:    Ammonia 41 (*)    All other components within normal limits  ETHANOL  URINE RAPID DRUG SCREEN, HOSP PERFORMED  MAGNESIUM     Radiology Ct Head Wo Contrast  Result Date: 09/13/2016 CLINICAL DATA:  Frequent falls. History of alcohol abuse and Meniere's disease EXAM: CT HEAD WITHOUT CONTRAST TECHNIQUE: Contiguous axial images were obtained from the base of the skull through the vertex without intravenous contrast. COMPARISON:  08/26/2016 FINDINGS: Brain: Mild superficial and moderate central atrophy with sulcal and ventricular prominence unchanged in appearance. Chronic appearing small vessel ischemic disease characterized by. Ventricular and coronal radiata hypodensity is again noted. No large vessel territory infarction, acute intracranial mass, hemorrhage, edema or midline shift. Vascular: Atherosclerotic  calcifications of  the cavernous carotid arteries bilaterally. Skull: No acute osseous abnormality. Sinuses/Orbits: Stable chronic left anterior lobe calcifications unchanged in appearance. Chronic left lamina papyracea fracture deformity. The this retention cyst of the left maxillary sinus. Right maxillary sinus mucosal thickening with hypertrophic changes of the maxillary sinus walls consistent chronic sinusitis. Other: None IMPRESSION: 1. 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 Stable cerebral atrophy. 2. No acute intracranial abnormality. 3. Chronic fracture deformity of the left lamina papyracea. 4. Chronic right maxillary sinusitis. Left maxillary mucous retention cysts. Electronically Signed   By: Ashley Royalty M.D.   On: 09/13/2016 14:14   Dg Hips Bilat With Pelvis 3-4 Views  Result Date: 09/13/2016 CLINICAL DATA:  Multiple falls.  Bilateral hip pain. EXAM: DG HIP (WITH OR WITHOUT PELVIS) 3-4V BILAT COMPARISON:  08/24/2016 pelvic radiograph. FINDINGS: No fracture or diastasis in the pelvis. No hip fracture or dislocation. 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 Fiducial markers overlie the prostate. Atherosclerotic change in the internal iliac arteries. No suspicious focal osseous lesion. IMPRESSION: No fracture.  No hip dislocation. Electronically Signed   By: Ilona Sorrel M.D.   On: 09/13/2016 14:01    Procedures .Critical Care Performed by: Dorie Rank Authorized by: Dorie Rank   Critical care provider statement:    Critical care time (minutes):  45   Critical care was necessary to treat or prevent imminent or life-threatening deterioration of the following conditions:  Metabolic crisis   Critical care was time spent personally by me on the following activities:  Discussions with consultants, evaluation of patient's response to treatment, examination of patient, ordering and performing treatments and interventions, ordering and  review of laboratory studies, ordering and review of radiographic studies, pulse oximetry, re-evaluation of patient's condition, obtaining history from patient or surrogate and review of old charts   (including critical care time)  Medications Ordered in ED Medications  0.9 %  sodium chloride infusion ( Intravenous New Bag/Given 09/13/16 1429)  LORazepam (ATIVAN) injection 1 mg (not administered)  LORazepam (ATIVAN) injection 0-4 mg (not administered)    Followed by  LORazepam (ATIVAN) injection 0-4 mg (not administered)  potassium chloride SA (K-DUR,KLOR-CON) CR tablet 40 mEq (not administered)  thiamine (B-1) injection 100 mg (100 mg Intravenous Given 09/13/16 1429)     Initial Impression / Assessment and Plan / ED Course  I have reviewed the triage vital signs and the nursing notes.  Pertinent labs & imaging results that were available during my care of the patient were reviewed by me and considered in my medical decision making (see chart for details).  Clinical Course  Comment By Time  Notified by nurse that ciwa is 11.  Will give ativan Dorie Rank, MD 09/26 1539  The patient remains awake but is tremulous and confused.  He is hypertensive.  Patient does have a history of alcohol abuse and he is chronically intoxicated when he comes to the emergency room. His alcohol level is less than 5 here today I will consult the medical service for admission.  I'm concerned about early DTs. Dorie Rank, MD 09/26 1601     Final Clinical Impressions(s) / ED Diagnoses   Final diagnoses:  Alcohol withdrawal, with delirium ([place])  Hypokalemia      Dorie Rank, MD 09/13/16 [phone]

## 2016-09-14 DIAGNOSIS — F10231 Alcohol dependence with withdrawal delirium: Principal | ICD-10-CM

## 2016-09-14 DIAGNOSIS — W19XXXD Unspecified fall, subsequent encounter: Secondary | ICD-10-CM

## 2016-09-14 DIAGNOSIS — E876 Hypokalemia: Secondary | ICD-10-CM

## 2016-09-14 LAB — COMPREHENSIVE METABOLIC PANEL
ALBUMIN: 3.8 g/dL (ref 3.5–5.0)
ALT: 58 U/L (ref 17–63)
ANION GAP: 8 (ref 5–15)
AST: 93 U/L — ABNORMAL HIGH (ref 15–41)
Alkaline Phosphatase: 88 U/L (ref 38–126)
BILIRUBIN TOTAL: 1.4 mg/dL — AB (ref 0.3–1.2)
BUN: 8 mg/dL (ref 6–20)
CALCIUM: 8.8 mg/dL — AB (ref 8.9–10.3)
CO2: 23 mmol/L (ref 22–32)
Chloride: 108 mmol/L (ref 101–111)
Creatinine, Ser: 0.67 mg/dL (ref 0.61–1.24)
GLUCOSE: 129 mg/dL — AB (ref 65–99)
POTASSIUM: 2.8 mmol/L — AB (ref 3.5–5.1)
Sodium: 139 mmol/L (ref 135–145)
TOTAL PROTEIN: 6.5 g/dL (ref 6.5–8.1)

## 2016-09-14 LAB — CBC
HCT: 32.8 % — ABNORMAL LOW (ref 39.0–52.0)
Hemoglobin: 11.5 g/dL — ABNORMAL LOW (ref 13.0–17.0)
MCH: 35 pg — ABNORMAL HIGH (ref 26.0–34.0)
MCHC: 35.1 g/dL (ref 30.0–36.0)
MCV: 99.7 fL (ref 78.0–100.0)
PLATELETS: 141 10*3/uL — AB (ref 150–400)
RBC: 3.29 MIL/uL — AB (ref 4.22–5.81)
RDW: 17.1 % — AB (ref 11.5–15.5)
WBC: 5.2 10*3/uL (ref 4.0–10.5)

## 2016-09-14 LAB — BASIC METABOLIC PANEL
ANION GAP: 7 (ref 5–15)
BUN: 8 mg/dL (ref 6–20)
CALCIUM: 8.8 mg/dL — AB (ref 8.9–10.3)
CO2: 22 mmol/L (ref 22–32)
Chloride: 108 mmol/L (ref 101–111)
Creatinine, Ser: 0.69 mg/dL (ref 0.61–1.24)
GFR calc Af Amer: >60 mL/min (ref >60)
Glucose, Bld: 102 mg/dL — ABNORMAL HIGH (ref 65–99)
POTASSIUM: 3.5 mmol/L (ref 3.5–5.1)
SODIUM: 137 mmol/L (ref 135–145)

## 2016-09-14 LAB — PHOSPHORUS: PHOSPHORUS: 3 mg/dL (ref 2.5–4.6)

## 2016-09-14 LAB — MAGNESIUM: MAGNESIUM: 1.8 mg/dL (ref 1.7–2.4)

## 2016-09-14 MED ORDER — FOLIC ACID 1 MG PO TABS
1.0000 mg | ORAL_TABLET | Freq: Every day | ORAL | Status: DC
  Administered 2016-09-15 – 2016-09-23 (×9): 1 mg via ORAL
  Filled 2016-09-14 (×9): qty 1

## 2016-09-14 MED ORDER — VITAMIN B-1 100 MG PO TABS
100.0000 mg | ORAL_TABLET | Freq: Every day | ORAL | Status: DC
  Administered 2016-09-15 – 2016-09-23 (×9): 100 mg via ORAL
  Filled 2016-09-14 (×9): qty 1

## 2016-09-14 MED ORDER — MIRTAZAPINE 15 MG PO TABS
7.5000 mg | ORAL_TABLET | Freq: Every day | ORAL | Status: DC
  Administered 2016-09-14 – 2016-09-22 (×9): 7.5 mg via ORAL
  Filled 2016-09-14 (×9): qty 1

## 2016-09-14 MED ORDER — POTASSIUM CHLORIDE 10 MEQ/100ML IV SOLN
10.0000 meq | INTRAVENOUS | Status: AC
  Administered 2016-09-14 (×4): 10 meq via INTRAVENOUS
  Filled 2016-09-14 (×3): qty 100

## 2016-09-14 MED ORDER — POTASSIUM CHLORIDE 10 MEQ/100ML IV SOLN
INTRAVENOUS | Status: AC
  Filled 2016-09-14: qty 100

## 2016-09-14 MED ORDER — POTASSIUM CHLORIDE CRYS ER 20 MEQ PO TBCR
40.0000 meq | EXTENDED_RELEASE_TABLET | Freq: Every day | ORAL | Status: DC
  Administered 2016-09-14 – 2016-09-23 (×10): 40 meq via ORAL
  Filled 2016-09-14 (×10): qty 2

## 2016-09-14 MED ORDER — MAGNESIUM SULFATE 2 GM/50ML IV SOLN
2.0000 g | Freq: Once | INTRAVENOUS | Status: AC
  Administered 2016-09-14: 2 g via INTRAVENOUS
  Filled 2016-09-14: qty 50

## 2016-09-14 MED ORDER — CARVEDILOL 12.5 MG PO TABS
12.5000 mg | ORAL_TABLET | Freq: Two times a day (BID) | ORAL | Status: DC
  Administered 2016-09-14 – 2016-09-22 (×18): 12.5 mg via ORAL
  Filled 2016-09-14 (×18): qty 1

## 2016-09-14 NOTE — Care Management Note (Signed)
Case Management Note  Patient Details  Name: Joshua Henson MRN: [id number] Date of Birth: [birth year]  Subjective/Objective:          etoh withdrawal with tremors          Action/Plan:   tbd by psych   Expected Discharge Date:   (unknown)               Expected Discharge Plan:  Home/Self Care  In-House Referral:     Discharge planning Services     Post Acute Care Choice:    Choice offered to:     DME Arranged:    DME Agency:     HH Arranged:    [place] Agency:     Status of Service:  In process, will continue to follow  If discussed at Long Length of Stay Meetings, dates discussed:    Additional Comments:  Will follow for needs/[place] Lulu Riding [id number]  Leeroy Cha, RN 09/14/2016, 9:32 AM

## 2016-09-14 NOTE — Consult Note (Signed)
[facility] Consult   Reason for Consult: depression  Referring Physician: Dr. Carolin Sicks  Patient Identification: Joshua Henson MRN:  [id number] Principal Diagnosis: Alcohol Dependence, Alcohol Withdrawal Diagnosis:   Patient Active Problem List   Diagnosis Date Noted  . Alcohol withdrawal delirium ([place]) [F10.231] 09/13/2016  . Left upper quadrant pain [R10.12] 05/10/2016  . Encephalopathy [G93.40] 05/09/2016  . C. difficile colitis [A04.7] 03/30/2016  . Acute kidney injury ([place]) [N17.9] 03/30/2016  . Essential hypertension [I10] 03/23/2016  . Fall [W19.XXXA]   . Tobacco abuse [Z72.0]   . Hypokalemia [E87.6]   . Hypomagnesemia [E83.42]   . Hypophosphatemia [E83.39]   . CAP (community acquired pneumonia) due to MSSA (methicillin sensitive Staphylococcus aureus) ([place]) [J15.211]   . CAD (coronary artery disease) [I25.10]   . Cocaine abuse [F14.10]   . Alcohol abuse with intoxication ([place]) [F10.129] 01/11/2016  . Frequent falls [R29.6] 01/11/2016  . Left rib fracture [S22.32XA] 01/11/2016  . Acute encephalopathy [G93.40] 01/11/2016  . Polysubstance abuse [F19.10] 01/11/2016  . Delirium tremens ([place]) [F10.231] 01/11/2016  . NSTEMI (non-ST elevated myocardial infarction) ([place]) [I21.4] 01/11/2016  . Sepsis due to Enterococcus with acute renal failure and metabolic encephalopathy ([place]) [A41.81, R65.20, N17.9, G93.41] 01/11/2016  . Alcohol intoxication ([place]) [F10.129]   . Chronic alcohol abuse [F10.10]   . Acute respiratory failure with hypoxia ([place]) [J96.01]   . Elevated troponin [R79.89] 01/10/2016  . Chest pain [R07.9] 01/10/2016  . CAD (coronary artery disease), native coronary artery [I25.10] 01/10/2016  . Fatty liver, alcoholic [M01.0] 27/25/3664  . Tremor [R25.1] 04/02/2015  . Ataxia [R27.0]   . Alcohol withdrawal ([place]) [F10.239] 09/03/2014  . Unspecified cerebral artery occlusion with cerebral infarction [I63.50] 07/05/2014  . Alcohol abuse [F10.10] 07/02/2014   . Current tobacco use [Z72.0] 01/17/2014  . Hearing loss [H91.90] 11/25/2013  . Tinnitus of both ears [H93.13] 11/25/2013    Total Time spent with patient: 30 minutes  Subjective:   Joshua Henson is a 70 y.o. male patient admitted with frequent falls and alcohol dependence. Marland Kitchen  HPI:  70 year old male, lives with GF, admitted on 9/26 due to falls, weakness. History of daily alcohol consumption- as per chart, patient's girlfriend had been diluting his alcohol with water to decrease the amount he was drinking . Patient has been noted to present depressed, sad. Reportedly he has not been taking prescribed antidepressant medications ( as per chart PTA med list, was prescribed Celexa and Wellbutrin ) .  Patient acknowledges history of alcohol dependence, worse over the last two to three years, states he drinks daily, and particularly heavily when his  GF is not at home. He also endorses history of depression . 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 He reports neuro-vegetative symptoms of depression - poor appetite, unspecified weight loss, poor energy level, anhedonia. Denies any suicidal ideations, denies hallucinations. Of note,insight into the negative impact that alcohol abuse is likely having on his health and functioning is currently limited .    Past Psychiatric History: reports history of depression following divorce and more recently after developing Meniere's Disease - denies any history of suicidal attempts, denies any history of psychosis, denies history of mania, states he has never been admitted to a psychiatric hospital. Reports he has been on several antidepressants in the past but does not remember names. As noted,  Celexa and Wellbutrin are listed as medications on his PTA med list, but patient was reportedly not taking these.    Risk to Self: Is patient at risk for suicide?: No Risk to Others:   Prior Inpatient Therapy:   Prior Outpatient Therapy:    Past Medical History:  Past Medical History:  Diagnosis Date  . Acute hepatic encephalopathy ([place])   . Alcoholic cirrhosis of liver without ascites ([place])   . Anxiety state   . CAD (coronary artery disease), native coronary artery 01/10/2016   3 vessel coronary calcification noted on CT scan 04/03/15    . Depression   . Fatty liver, alcoholic 06/06/5092  . Hypertension   . Increased ammonia level   . Insomnia   . NSTEMI (non-ST elevated myocardial infarction) ([place])   . Prostate cancer ([place]) 2010   External beam radiation (urol - Risa Grill, XRT Valere Dross)  . Stroke ([place])   . Tachycardia     Past Surgical History:  Procedure Laterality Date  . CARDIAC CATHETERIZATION N/A 01/15/2016   Procedure: Left Heart Cath and Coronary Angiography;  Surgeon: Burnell Blanks, MD;  Location: [facility];  Service: Cardiovascular;  Laterality: N/A;  . none     Family History:  Family History  Problem Relation Age of Onset  . Cancer Mother     esophageal  . Hypertension Mother   . Diabetes Mother   . Cancer Father     prostate  . Heart disease Maternal Grandfather     MI  . 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 33.6 oz/week  . 45 Shots of liquor per week    Comment: last drink yesterday     History  Drug Use No    Comment: Cocaine intermittently    Social History   Social History  . Marital status: Divorced    Spouse name: N/A  . Number of children: 3  . Years of education: 60   Occupational History  . lawyer    Social History Main Topics  . Smoking status: Current Every Day Smoker    Packs/day: 0.50    Years: 30.00    Types: Cigarettes  . Smokeless tobacco: Never Used  . Alcohol use  33.6 oz/week    56 Shots of liquor per week     Comment: last drink yesterday  . Drug use: No     Comment: Cocaine intermittently  . Sexual activity: Yes   Other Topics Concern  . None   Social History Narrative   HSG, [employer], [place]. Married 22 yrs yrs - divorced. Twin boys - 72; 1 dtr - '94. Pension scheme manager.    Additional Social History:    Allergies:   Allergies  Allergen Reactions  . Lisinopril     syncope  . Celebrex [Celecoxib] Rash    Labs:  Results for orders placed or performed during the hospital encounter of 09/13/16 (from the past 48 hour(s))  Urine rapid drug screen (hosp performed)     Status: Abnormal   Collection Time: 09/13/16  1:31 PM  Result Value Ref Range   Opiates NONE DETECTED NONE DETECTED   Cocaine NONE DETECTED NONE DETECTED   Benzodiazepines POSITIVE (A) NONE DETECTED   Amphetamines NONE DETECTED NONE DETECTED   Tetrahydrocannabinol NONE DETECTED NONE DETECTED   Barbiturates  NONE DETECTED NONE DETECTED    Comment:        DRUG SCREEN FOR MEDICAL PURPOSES ONLY.  IF CONFIRMATION IS NEEDED FOR ANY PURPOSE, NOTIFY LAB WITHIN 5 DAYS.        LOWEST DETECTABLE LIMITS FOR URINE DRUG SCREEN Drug Class       Cutoff (ng/mL) Amphetamine      1000 Barbiturate      200 Benzodiazepine   025 Tricyclics       852 Opiates          300 Cocaine          300 THC              50   Comprehensive metabolic panel     Status: Abnormal   Collection Time: 09/13/16  2:14 PM  Result Value Ref Range   Sodium 140 135 - 145 mmol/L   Potassium 3.1 (L) 3.5 - 5.1 mmol/L   Chloride 105 101 - 111 mmol/L   CO2 24 22 - 32 mmol/L   Glucose, Bld 102 (H) 65 - 99 mg/dL   BUN 10 6 - 20 mg/dL   Creatinine, Ser 0.57 (L) 0.61 - 1.24 mg/dL   Calcium 9.1 8.9 - 10.3 mg/dL   Total Protein 6.7 6.5 - 8.1 g/dL   Albumin 3.9 3.5 - 5.0 g/dL   AST 116 (H) 15 - 41 U/L   ALT 66 (H) 17 - 63 U/L   Alkaline Phosphatase 92 38 - 126 U/L   Total Bilirubin 1.7 (H) 0.3  - 1.2 mg/dL   GFR calc non Af Amer >60 >60 mL/min   GFR calc Af Amer >60 >60 mL/min    Comment: (NOTE) The eGFR has been calculated using the CKD EPI equation. This calculation has not been validated in all clinical situations. eGFR's persistently <60 mL/min signify possible Chronic Kidney Disease.    Anion gap 11 5 - 15  CBC with Differential     Status: Abnormal   Collection Time: 09/13/16  2:14 PM  Result Value Ref Range   WBC 5.0 4.0 - 10.5 K/uL   RBC 3.42 (L) 4.22 - 5.81 MIL/uL   Hemoglobin 11.7 (L) 13.0 - 17.0 g/dL   HCT 32.7 (L) 39.0 - 52.0 %   MCV 95.6 78.0 - 100.0 fL   MCH 34.2 (H) 26.0 - 34.0 pg   MCHC 35.8 30.0 - 36.0 g/dL   RDW 17.3 (H) 11.5 - 15.5 %   Platelets 151 150 - 400 K/uL   Neutrophils Relative % 66 %   Neutro Abs 3.3 1.7 - 7.7 K/uL   Lymphocytes Relative 25 %   Lymphs Abs 1.2 0.7 - 4.0 K/uL   Monocytes Relative 7 %   Monocytes Absolute 0.4 0.1 - 1.0 K/uL   Eosinophils Relative 1 %   Eosinophils Absolute 0.0 0.0 - 0.7 K/uL   Basophils Relative 1 %   Basophils Absolute 0.0 0.0 - 0.1 K/uL  Magnesium     Status: Abnormal   Collection Time: 09/13/16  2:14 PM  Result Value Ref Range   Magnesium 1.3 (L) 1.7 - 2.4 mg/dL  Ethanol     Status: None   Collection Time: 09/13/16  2:26 PM  Result Value Ref Range   Alcohol, Ethyl (B) <5 <5 mg/dL    Comment:        LOWEST DETECTABLE LIMIT FOR SERUM ALCOHOL IS 5 mg/dL FOR MEDICAL PURPOSES ONLY   Ammonia     Status: Abnormal  Collection Time: 09/13/16  2:26 PM  Result Value Ref Range   Ammonia 41 (H) 9 - 35 umol/L  MRSA PCR Screening     Status: None   Collection Time: 09/13/16  6:06 PM  Result Value Ref Range   MRSA by PCR NEGATIVE NEGATIVE    Comment:        The GeneXpert MRSA Assay (FDA approved for NASAL specimens only), is one component of a comprehensive MRSA colonization surveillance program. It is not intended to diagnose MRSA infection nor to guide or monitor treatment for MRSA infections.    Comprehensive metabolic panel     Status: Abnormal   Collection Time: 09/14/16  4:01 AM  Result Value Ref Range   Sodium 139 135 - 145 mmol/L   Potassium 2.8 (L) 3.5 - 5.1 mmol/L   Chloride 108 101 - 111 mmol/L   CO2 23 22 - 32 mmol/L   Glucose, Bld 129 (H) 65 - 99 mg/dL   BUN 8 6 - 20 mg/dL   Creatinine, Ser 0.67 0.61 - 1.24 mg/dL   Calcium 8.8 (L) 8.9 - 10.3 mg/dL   Total Protein 6.5 6.5 - 8.1 g/dL   Albumin 3.8 3.5 - 5.0 g/dL   AST 93 (H) 15 - 41 U/L   ALT 58 17 - 63 U/L   Alkaline Phosphatase 88 38 - 126 U/L   Total Bilirubin 1.4 (H) 0.3 - 1.2 mg/dL   GFR calc non Af Amer >60 >60 mL/min   GFR calc Af Amer >60 >60 mL/min    Comment: (NOTE) The eGFR has been calculated using the CKD EPI equation. This calculation has not been validated in all clinical situations. eGFR's persistently <60 mL/min signify possible Chronic Kidney Disease.    Anion gap 8 5 - 15  Magnesium     Status: None   Collection Time: 09/14/16  4:01 AM  Result Value Ref Range   Magnesium 1.8 1.7 - 2.4 mg/dL  Phosphorus     Status: None   Collection Time: 09/14/16  4:01 AM  Result Value Ref Range   Phosphorus 3.0 2.5 - 4.6 mg/dL  CBC     Status: Abnormal   Collection Time: 09/14/16  4:01 AM  Result Value Ref Range   WBC 5.2 4.0 - 10.5 K/uL   RBC 3.29 (L) 4.22 - 5.81 MIL/uL   Hemoglobin 11.5 (L) 13.0 - 17.0 g/dL   HCT 32.8 (L) 39.0 - 52.0 %   MCV 99.7 78.0 - 100.0 fL   MCH 35.0 (H) 26.0 - 34.0 pg   MCHC 35.1 30.0 - 36.0 g/dL    Comment: PRE-WARMING TECHNIQUE USED   RDW 17.1 (H) 11.5 - 15.5 %   Platelets 141 (L) 150 - 400 K/uL    Current Facility-Administered Medications  Medication Dose Route Frequency Provider Last Rate Last Dose  . 0.9 %  sodium chloride infusion   Intravenous Continuous Mariel Aloe, MD 100 mL/hr at 09/13/16 2000    . aspirin EC tablet 81 mg  81 mg Oral Daily Mariel Aloe, MD   81 mg at 09/14/16 0951  . carvedilol (COREG) tablet 12.5 mg  12.5 mg Oral BID WC Dron Tanna Furry, MD   12.5 mg at 09/14/16 0728  . feeding supplement (ENSURE ENLIVE) (ENSURE ENLIVE) liquid 237 mL  237 mL Oral BID BM Mariel Aloe, MD   237 mL at 09/14/16 1454  . [START ON 6/37/8588] folic acid (FOLVITE) tablet 1 mg  1 mg  Oral Daily Dron Tanna Furry, MD      . heparin injection 5,000 Units  5,000 Units Subcutaneous Q8H Mariel Aloe, MD   5,000 Units at 09/14/16 1454  . hydrALAZINE (APRESOLINE) injection 5 mg  5 mg Intravenous Q6H PRN Mariel Aloe, MD      . LORazepam (ATIVAN) injection 2-3 mg  2-3 mg Intravenous Q1H PRN Mariel Aloe, MD   2 mg at 09/14/16 1454  . morphine 2 MG/ML injection 2 mg  2 mg Intravenous Q4H PRN Mariel Aloe, MD   2 mg at 09/14/16 1454  . multivitamin with minerals tablet 1 tablet  1 tablet Oral Daily Mariel Aloe, MD   1 tablet at 09/14/16 0951  . ondansetron (ZOFRAN) injection 4 mg  4 mg Intravenous Q8H PRN Mariel Aloe, MD      . potassium chloride SA (K-DUR,KLOR-CON) CR tablet 40 mEq  40 mEq Oral Daily Dron Tanna Furry, MD   40 mEq at 09/14/16 0951  . sodium chloride flush (NS) 0.9 % injection 3 mL  3 mL Intravenous Q12H Mariel Aloe, MD      . Derrill Memo ON 09/15/2016] thiamine (VITAMIN B-1) tablet 100 mg  100 mg Oral Daily Dron Tanna Furry, MD        Musculoskeletal: Strength & Muscle Tone: within normal limits (+) distal tremors  Gait & Station: gait not examined, patient has been falling recently  Patient leans: N/A  Psychiatric Specialty Exam: Physical Exam  ROS denies headache, denies chest pain, denies shortness of breath, denies vomiting   Blood pressure 132/69, pulse 91, temperature 99.3 F (37.4 C), temperature source Oral, resp. rate 19, height _0  (1.778 m), weight 183 lb 10.3 oz (83.3 kg), SpO2 96 %.Body mass index is 26.35 kg/m.  General Appearance: Fairly Groomed in hospital garb   Eye Contact:  Good  Speech:  Normal Rate  Volume:  Normal  Mood:  Depressed  Affect:  Constricted, but smiles at times  appropriately   Thought Process:  Linear  Orientation:  Other:  oriented to person, partially oriented to place- " hospital ", partially oriented to time " February, 2017"  Thought Content:  denies hallucinations, no delusions expressed, does not appear internally preoccupied   Suicidal Thoughts:  No- denies suicidal or self injurious ideations , denies violent or homicidal ideations   Homicidal Thoughts:  No  Memory:  recent and remote fair   Judgement:  Fair  Insight:  Fair  Psychomotor Activity:  (+) distal tremors, not agitated or restless  Concentration:  Concentration: Fair and Attention Span: Fair  Recall:  [employer] of Knowledge:  Good  Language:  Good  Akathisia:  Negative  Handed:  Right  AIMS (if indicated):     Assets:  Desire for Improvement Resilience  ADL's:   Fair   Cognition:  Impaired,  Moderate  Sleep:      Assessment - patient is a 70 year old man who lives with GF. History of alcohol dependence, as per patient started drinking heavily about two years ago . Currently presenting with some symptoms of withdrawal - distal tremors, vague/mild restlessness, but vitals stable. Presents alert, attentive, does not appear delirious at this time, but remains disoriented . He endorses depression, and presents with sad affect, neuro-vegetative symptoms of depression, but denies any suicidal ideations and is not psychotic .    Treatment Plan Summary: as below   Disposition: No evidence of imminent risk to self or  others at present.   Patient does not meet criteria for psychiatric inpatient admission. Consider REMERON trial to help address depression , may also help with improving sleep and appetite . Would start at 7.5 mgrs QHS . I discussed this medication and its side effects with patient and he stated he would " give it a try". (Another option would be to restart CELEXA , which he was being prescribed prior to admission, but was not taking . Would start at 10 mgrs QDAY  initially.) Would NOT restart Wellbutrin at this time, due to his current alcohol Withdrawal status and this medication's  potential for decreasing seizure threshold, causing seizures Continue to provide education, motivation, support regarding the importance of alcohol abstinence as an integral part of treatment goals . Agree with Ativan detox protocol as needed to minimize WDL symptoms . Neita Garnet, MD 09/14/2016 3:47 PM

## 2016-09-14 NOTE — Clinical Social Work Note (Signed)
Clinical Social Work Assessment  Patient Details  Name: Joshua Henson MRN: [id number] Date of Birth: Apr 23, 1946  Date of referral:  09/14/16               Reason for consult:  Substance Use/ETOH Abuse, Mental Health Concerns                Permission sought to share information with:  Family Supports, Psychiatrist, Case Manager Permission granted to share information::  Yes, Verbal Permission Granted  Name::        Agency::     Relationship::  Significant Other: Joshua Henson Information:  [phone]  Housing/Transportation Living arrangements for the past 2 months:  [place] of Information:  Patient Patient Interpreter Needed:  None Criminal Activity/Legal Involvement Pertinent to Current Situation/Hospitalization:  No - Comment as needed Significant Relationships:  Significant Other Lives with:  Significant Other Do you feel safe going back to the place where you live?  Yes Need for family participation in patient care:  Yes (Comment)  Care giving concerns:  Patient was agreeable to assessment. Oriented enough to answer questions. Patient reports he wears hearing aids and his wife was going to bring them later in the day, patient informed LCSWA to speak loudly so that he could understand. 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 The patient reports he often is depressed and is having a hard time understanding and coping with his hearing loss.  Patient reports he has been hospitalized several times in past the year for additional health issues such as C. Diff. During this time patient feels he has lost his "pestige." The patient reports, "I use to be a social drinker but now I drink because I am depressed. The patient denies SI/HI. The patient reports he has been to two nursing homes for physical therapy for falls, he  admits that his drinking contributed to some of the falls. The patient reports he is not interested in SA programs at this time but will take a look at the list provided by [facility].   Patient significant other reports: The patient has been drinking heavily-nonstop for the past three weeks. She had to take his keys to keep the patient from driving. She reports he had several falls while at home until he was so weak he asked she call EMS. She reports this has been an ongoing for the patient.   Social Worker assessment / plan:  LCSWA met with patient at bedside. LCSWA informed patient reason for consult. 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 The patient denies historySA programs but reports going to physical therapy for falls.   Plan: Patient will be seen by psychiatrist for his depression, etoh. LCSWA will follow up with psychiatrist recommendations.       Employment status:  Aeronautical engineer:  Medicare PT Recommendations:  Not assessed at this time Information / Referral to community resources:  Residential Substance Abuse Treatment Options, Outpatient Psychiatric Care (Comment Required)  Patient/Family's Response to care:  Agreeable to care at this time, not sure if patient will be agree to SA. Patient will need to be seen by physical therapy to help determine patient disposition.   Patient/Family's Understanding of  and Emotional Response to Diagnosis, Current Treatment, and Prognosis:  " I am getting old and I cannot continue the way that  I am, I do not know what my future will look like."  Emotional Assessment Appearance:  Appears older than stated age Attitude/Demeanor/Rapport:  Lethargic Affect (typically observed):  Accepting, Depressed Orientation:  Oriented to Self, Oriented to Place, Oriented to Situation Alcohol / Substance use:  Alcohol Use Psych  involvement (Current and /or in the community):  Yes (Comment)  Discharge Needs  Concerns to be addressed:  Mental Health Concerns Readmission within the last 30 days:  Yes Current discharge risk:  Substance Abuse Barriers to Discharge:  Continued Medical Work up, Active Substance Use   Joshua Hopping, LCSW 09/14/2016, 11:22 AM

## 2016-09-14 NOTE — Progress Notes (Signed)
PROGRESS NOTE    Joshua Henson  [id number] DOB: 1946-10-21 DOA: 09/13/2016 PCP: Mayra Neer, MD    Brief Narrative: 70 year old male with history of alcohol abuse, stroke, coronary artery disease, hypertension, depression, anxiety, LIPID liver cirrhosis, hepatic encephalopathy presented after a fall in the setting of ongoing use. Patient drinks alcohol everyday and now monitoring for any withdrawal. Psychiatric consult called for the evaluation of depression.  Assessment & Plan:   # Alcohol abuse and possible alcohol withdrawal delirium:  -continue CIWA protocol -Continue Timentin, folic acid and multivitamin -On Ativan as needed  -Continue supportive care.  #Depression: The patient reported low mood and depressed. Denied suicidal or homicidal ideation. He is not compliant with the medications. He is willing to talk to psychiatrist regarding his depression. Psychiatric consult called today.  # Essential hypertension: Resume home dose of Coreg. Monitor blood pressure, heart rate. Continue telemetry.  #Severe hypokalemia, hypomagnesemia: Replete potassium chloride IV and oral. Repleted magnesium sulfate. Monitor labs.  #History liver cirrhosis: Continue supportive care. Ammonia level only mildly elevated.    #Mild thrombocytopenia likely in the setting of liver cirrhosis: No sign of bleeding. Monitor blood counts.  DVT prophylaxis: Heparin subcutaneous Code Status: Full code Family Communication: No family member present at bedside Disposition Plan: Likely discharge home in 1-2 days  Consultants:   Psychiatry consult called today.    Subjective: Patient was seen and examined at bedside. Patient is hard of hearing. Reports feeling weak and tired and depressed. Denied suicidal or homicidal ideation. Denied chest pain, shortness of breath, headache, nausea, vomiting or abdominal pain.   Objective: Vitals:   09/14/16 0500 09/14/16 0600 09/14/16 0800 09/14/16 0900  BP: (!)  156/93 (!) 158/99 135/84 120/75  Pulse:   91   Resp: 14 17 20 17   Temp:   99 F (37.2 C)   TempSrc:   Oral   SpO2: 98% 97% 96% 96%  Weight:      Height:        Intake/Output Summary (Last 24 hours) at 09/14/16 1027 Last data filed at 09/14/16 0728  Gross per 24 hour  Intake          1633.33 ml  Output              500 ml  Net          1133.33 ml   Filed Weights   09/13/16 1559 09/13/16 1832  Weight: 81.6 kg (180 lb) 83.3 kg (183 lb 10.3 oz)    Examination:  General exam: Appears calm and comfortable  Respiratory system: Clear to auscultation. Respiratory effort normal. Cardiovascular system: S1 & S2 heard, RRR. No pedal edema. Gastrointestinal system: Abdomen is nondistended, soft and nontender.  Normal bowel sounds heard. Central nervous system: No focal neurological deficits.Alert awake and following commands Extremities: Symmetric 5 x 5 power. Skin: No rashes or ulcers Psychiatry: low mood and depressed.     Data Reviewed: I have personally reviewed following labs and imaging studies  CBC:  Recent Labs Lab 09/13/16 1414 09/14/16 0401  WBC 5.0 5.2  NEUTROABS 3.3  --   HGB 11.7* 11.5*  HCT 32.7* 32.8*  MCV 95.6 99.7  PLT 151 Q000111Q*   Basic Metabolic Panel:  Recent Labs Lab 09/13/16 1414 09/14/16 0401  NA 140 139  K 3.1* 2.8*  CL 105 108  CO2 24 23  GLUCOSE 102* 129*  BUN 10 8  CREATININE 0.57* 0.67  CALCIUM 9.1 8.8*  MG 1.3* 1.8  PHOS  --  3.0   GFR: Estimated Creatinine Clearance: 91.3 mL/min (by C-G formula based on SCr of 0.67 mg/dL). Liver Function Tests:  Recent Labs Lab 09/13/16 1414 09/14/16 0401  AST 116* 93*  ALT 66* 58  ALKPHOS 92 88  BILITOT 1.7* 1.4*  PROT 6.7 6.5  ALBUMIN 3.9 3.8   No results for input(s): LIPASE, AMYLASE in the last 168 hours.  Recent Labs Lab 09/13/16 1426  AMMONIA 41*   Coagulation Profile: No results for input(s): INR, PROTIME in the last 168 hours. Cardiac Enzymes: No results for input(s):  CKTOTAL, CKMB, CKMBINDEX, TROPONINI in the last 168 hours. BNP (last 3 results) No results for input(s): PROBNP in the last 8760 hours. HbA1C: No results for input(s): HGBA1C in the last 72 hours. CBG: No results for input(s): GLUCAP in the last 168 hours. Lipid Profile: No results for input(s): CHOL, HDL, LDLCALC, TRIG, CHOLHDL, LDLDIRECT in the last 72 hours. Thyroid Function Tests: No results for input(s): TSH, T4TOTAL, FREET4, T3FREE, THYROIDAB in the last 72 hours. Anemia Panel: No results for input(s): VITAMINB12, FOLATE, FERRITIN, TIBC, IRON, RETICCTPCT in the last 72 hours. Sepsis Labs: No results for input(s): PROCALCITON, LATICACIDVEN in the last 168 hours.  Recent Results (from the past 240 hour(s))  MRSA PCR Screening     Status: None   Collection Time: 09/13/16  6:06 PM  Result Value Ref Range Status   MRSA by PCR NEGATIVE NEGATIVE Final    Comment:        The GeneXpert MRSA Assay (FDA approved for NASAL specimens only), is one component of a comprehensive MRSA colonization surveillance program. It is not intended to diagnose MRSA infection nor to guide or monitor treatment for MRSA infections.          Radiology Studies: Ct Head Wo Contrast  Result Date: 09/13/2016 CLINICAL DATA:  Frequent falls. History of alcohol abuse and Meniere's disease EXAM: CT HEAD WITHOUT CONTRAST TECHNIQUE: Contiguous axial images were obtained from the base of the skull through the vertex without intravenous contrast. COMPARISON:  08/26/2016 FINDINGS: Brain: Mild superficial and moderate central atrophy with sulcal and ventricular prominence unchanged in appearance. Chronic appearing small vessel ischemic disease characterized by. Ventricular and coronal radiata hypodensity is again noted. No large vessel territory infarction, acute intracranial mass, hemorrhage, edema or midline shift. Vascular: Atherosclerotic calcifications of the cavernous carotid arteries bilaterally. Skull: No  acute osseous abnormality. Sinuses/Orbits: Stable chronic left anterior lobe calcifications unchanged in appearance. Chronic left lamina papyracea fracture deformity. The this retention cyst of the left maxillary sinus. Right maxillary sinus mucosal thickening with hypertrophic changes of the maxillary sinus walls consistent chronic sinusitis. Other: None IMPRESSION: 1. 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 Stable cerebral atrophy. 2. No acute intracranial abnormality. 3. Chronic fracture deformity of the left lamina papyracea. 4. Chronic right maxillary sinusitis. Left maxillary mucous retention cysts. Electronically Signed   By: Ashley Royalty M.D.   On: 09/13/2016 14:14   Dg Hips Bilat With Pelvis 3-4 Views  Result Date: 09/13/2016 CLINICAL DATA:  Multiple falls.  Bilateral hip pain. EXAM: DG HIP (WITH OR WITHOUT PELVIS) 3-4V BILAT COMPARISON:  08/24/2016 pelvic radiograph. FINDINGS: No fracture or diastasis in the pelvis. No hip fracture or dislocation. 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 Fiducial markers overlie the prostate. Atherosclerotic change in the internal iliac arteries. No suspicious focal osseous lesion. IMPRESSION: No fracture.  No hip dislocation. Electronically Signed   By: Janina Mayo.D.  On: 09/13/2016 14:01        Scheduled Meds: . aspirin EC  81 mg Oral Daily  . carvedilol  12.5 mg Oral BID WC  . feeding supplement (ENSURE ENLIVE)  237 mL Oral BID BM  . folic acid  1 mg Intravenous Daily  . heparin  5,000 Units Subcutaneous Q8H  . multivitamin with minerals  1 tablet Oral Daily  . potassium chloride  10 mEq Intravenous Q1 Hr x 4  . potassium chloride SA  40 mEq Oral Daily  . sodium chloride flush  3 mL Intravenous Q12H  . thiamine  100 mg Intravenous Daily   Continuous Infusions: . sodium chloride 100 mL/hr at 09/13/16 2000     LOS: 1 day    Time spent: 30 minutes    Dron Tanna Furry,  MD [facility] Pager [phone]  If 7PM-7AM, please contact night-coverage [email or web address] Password [facility] 09/14/2016, 10:27 AM

## 2016-09-14 NOTE — Progress Notes (Signed)
Initial Nutrition Assessment  DOCUMENTATION CODES:   Not applicable  INTERVENTION:  - Continue Ensure Enlive BID, each supplement provides 350 kcal and 20 grams of protein - Continue daily multivitamin with minerals. - Encourage PO intakes of meals and supplements. - RD will continue to monitor for additional nutrition-related needs.  NUTRITION DIAGNOSIS:   Inadequate oral intake related to poor appetite, social / environmental circumstances as evidenced by per patient/family report.  GOAL:   Patient will meet greater than or equal to 90% of their needs  MONITOR:   PO intake, Supplement acceptance, Weight trends, Labs, Skin, I & O's  REASON FOR ASSESSMENT:   Malnutrition Screening Tool  ASSESSMENT:   70 y.o. male with medical history significant of alcohol abuse, stroke, and STEMI, hypertension, depression, anxiety, alcohol cirrhosis, hepatic encephalopathy. Patient reports that early this morning around 4 AM, he fell down backwards. He reports no the time. His girlfriend found him at around 6:00 AM and wanted to bring him to the emergency room. He refused at that time. Symptoms of weakness continued and girlfriend eventually can since patient to be evaluated in the emergency department. Patient drinks liquor daily. Last drink was yesterday. According to the EDP report, patient's girlfriend has been diluting his alcohol.  Pt seen for MST. BMI indicates overweight status. Per chart review, pt consumed 100% of meal at 0253 today which pt reports was a grilled chicken sandwich. He states that he has not eaten anything other than the sandwich in the past 2 days. He states prior to that he had a "fair" appetite for unknown amount of time but states that he would not eat breakfast, some days he would eat lunch and some days he would not, and he ate dinner every night. He states that he has no problems with chewing but sometimes feels that items are difficult to swallow; drinking fluids with  bites of food helps relieve this sensation.   Pt states that he likes Ensure and is very happy that order has already been placed for during hospitalization. Pt requested chocolate Ensure during RD visit; provide this to him. Per rounds this AM, pt in active withdrawal. Pt denies tremors/shakiness to hands. Pt denies abdominal pain or nausea at this time.   Unable to complete physical assessment and will do so at follow-up. Pt reports that over the past 6-8 months he has lost 15 lbs. This is consistent with chart review which indicates 13 lb weight loss (7% body weight) in the past 5 months which is not significant for time frame. Will continue to monitor weight trends during hospitalization.  Medications reviewed; 1 mg IV folic acid, 2 g IV Mg sulfate x1 dose yesterday and x1 dose today, daily multivitamin with minerals, PRN IV Zofran, 40 mEq oral KCl x2 doses yesterday and x1 dose today, 100 mg IV thiamine/day.   Labs reviewed; K: 2.8 mmol/L, Ca: 8.8 mg/dL, AST elevated but trending down.  IVF: NS @ 100 mL/hr.     Diet Order:  Diet Heart Room service appropriate? Yes; Fluid consistency: Thin  Skin:   abrasions and lacerations to fingers and toes.   Last BM:  PTA  Height:   Ht Readings from Last 1 Encounters:  09/13/16 5\' 10"  (1.778 m)    Weight:   Wt Readings from Last 1 Encounters:  09/13/16 183 lb 10.3 oz (83.3 kg)    Ideal Body Weight:  75.45 kg  BMI:  Body mass index is 26.35 kg/m.  Estimated Nutritional Needs:  Kcal:  1600-1800  Protein:  70-80 grams  Fluid:  >/= 1.8 L/day  EDUCATION NEEDS:   No education needs identified at this time    Joshua Matin, MS, RD, LDN Inpatient Clinical Dietitian Pager # [phone] After hours/weekend pager # [phone]

## 2016-09-15 LAB — BASIC METABOLIC PANEL
ANION GAP: 10 (ref 5–15)
Anion gap: 8 (ref 5–15)
BUN: 10 mg/dL (ref 6–20)
BUN: 11 mg/dL (ref 6–20)
CHLORIDE: 106 mmol/L (ref 101–111)
CO2: 20 mmol/L — AB (ref 22–32)
CO2: 20 mmol/L — AB (ref 22–32)
Calcium: 9.1 mg/dL (ref 8.9–10.3)
Calcium: 8.8 mg/dL — ABNORMAL LOW (ref 8.9–10.3)
Chloride: 105 mmol/L (ref 101–111)
Creatinine, Ser: 0.66 mg/dL (ref 0.61–1.24)
Creatinine, Ser: 0.78 mg/dL (ref 0.61–1.24)
GFR calc Af Amer: >60 mL/min (ref >60)
GFR calc non Af Amer: >60 mL/min (ref >60)
GLUCOSE: 96 mg/dL (ref 65–99)
Glucose, Bld: 101 mg/dL — ABNORMAL HIGH (ref 65–99)
POTASSIUM: 3.8 mmol/L (ref 3.5–5.1)
POTASSIUM: 3.6 mmol/L (ref 3.5–5.1)
SODIUM: 134 mmol/L — AB (ref 135–145)
Sodium: 135 mmol/L (ref 135–145)

## 2016-09-15 LAB — MAGNESIUM: Magnesium: 1.7 mg/dL (ref 1.7–2.4)

## 2016-09-15 LAB — PHOSPHORUS: Phosphorus: 2.1 mg/dL — ABNORMAL LOW (ref 2.5–4.6)

## 2016-09-15 MED ORDER — THIAMINE HCL 100 MG/ML IJ SOLN
Freq: Once | INTRAVENOUS | Status: AC
  Administered 2016-09-15: 12:00:00 via INTRAVENOUS
  Filled 2016-09-15: qty 1000

## 2016-09-15 MED ORDER — POTASSIUM PHOSPHATE MONOBASIC 500 MG PO TABS
500.0000 mg | ORAL_TABLET | Freq: Three times a day (TID) | ORAL | Status: DC
  Administered 2016-09-15 – 2016-09-20 (×19): 500 mg via ORAL
  Filled 2016-09-15 (×22): qty 1

## 2016-09-15 MED ORDER — HALOPERIDOL LACTATE 5 MG/ML IJ SOLN
5.0000 mg | Freq: Four times a day (QID) | INTRAMUSCULAR | Status: DC | PRN
  Administered 2016-09-15 – 2016-09-17 (×7): 5 mg via INTRAVENOUS
  Filled 2016-09-15 (×7): qty 1

## 2016-09-15 MED ORDER — LORAZEPAM 2 MG/ML IJ SOLN
2.0000 mg | Freq: Once | INTRAMUSCULAR | Status: AC
  Administered 2016-09-15: 2 mg via INTRAVENOUS

## 2016-09-15 NOTE — Progress Notes (Signed)
Patient has Air cabin crew at bedside. Patient is able to be redirected at this time. Restraints are not required now.Will continue to monitor patient.

## 2016-09-15 NOTE — Progress Notes (Signed)
Pt attempting to get out of the bed stating that he needs to go to the liquor store before it closes. Pt reoriented to being in the hospital. Pt verbalizes understanding and is redirected back to the bed.

## 2016-09-15 NOTE — Progress Notes (Signed)
K. Schorr,NP notified of pt having increased agitation that does not mprove with prn order of ativan given every hour. Order obtained for haldol and ativan.

## 2016-09-15 NOTE — Progress Notes (Signed)
PROGRESS NOTE    Joshua Henson  [id number] DOB: [birth year] DOA: 09/13/2016 PCP: Mayra Neer, MD    Brief Narrative: 70 year old male with history of alcohol abuse, stroke, coronary artery disease, hypertension, depression, anxiety, liver cirrhosis, hepatic encephalopathy presented after a fall in the setting of alcohol use. Patient drinks alcohol everyday and now monitoring for withdrawal. Psychiatric consult for evaluation of depression, started Remeron.  Assessment & Plan:   # Alcohol abuse and possible alcohol withdrawal delirium:  -continue CIWA protocol.  -required haldol and bed sitter for safety. Continue current dose of ativan. Still actively withdrawing.  -Continue Thimentin, folic acid and multivitamin -On Ativan as needed  -Continue supportive care. -started banana bag.  #Depression: started Remeron. Psych consult appreciated.   # Essential hypertension: Continue Coreg. Monitor blood pressure, heart rate. Continue telemetry. Elevated blood pressure probably in the setting of alcohol withdrawal.  #Hypokalemia, hypomagnesemia: Repleted KCL and Kphos today. Monitor daily labs.   # Hypophosphatemia: added oral Kphos.  #History liver cirrhosis: Continue supportive care. Ammonia level only mildly elevated on admission.    #Mild thrombocytopenia likely in the setting of liver cirrhosis: No sign of bleeding. Monitor blood counts.  DVT prophylaxis: Heparin subcutaneous Code Status: Full code Family Communication: No family member present at bedside Disposition Plan: Likely discharge home in 1-2 days  Consultants:   Psychiatry consult.    Subjective: Patient was seen and examined at bedside. Patient has bed sitter. Patient was alert awake and following commands. He is actively withdrawing. Denied chest pain, shortness of breath, nausea or vomiting. Bed sitter present.  Objective: Vitals:   09/15/16 1038 09/15/16 1040 09/15/16 1200 09/15/16 1205  BP:  (!) 143/86  (!) 166/106 (!) 143/101  Pulse: 90     Resp:  20 (!) 21 (!) 25  Temp:      TempSrc:      SpO2:  97% 100% 96%  Weight:      Height:        Intake/Output Summary (Last 24 hours) at 09/15/16 1248 Last data filed at 09/15/16 1043  Gross per 24 hour  Intake             3283 ml  Output             1025 ml  Net             2258 ml   Filed Weights   09/13/16 1559 09/13/16 1832  Weight: 81.6 kg (180 lb) 83.3 kg (183 lb 10.3 oz)    Examination:  General exam:Appears comfortable.  Respiratory system: Clear to auscultation. Respiratory effort normal. Cardiovascular system: S1-S2 normal, regular rate and rhythm. No pedal edema. Gastrointestinal system: Abdomen soft, nontender, nondistended. Bowel sounds positive. Central nervous system: Alert, awake and following simple commands. He has upper extremity tremors. Extremities: Symmetric 5 x 5 power. Skin: No rashes or ulcers Psychiatry: depression.    Data Reviewed: I have personally reviewed following labs and imaging studies  CBC:  Recent Labs Lab 09/13/16 1414 09/14/16 0401  WBC 5.0 5.2  NEUTROABS 3.3  --   HGB 11.7* 11.5*  HCT 32.7* 32.8*  MCV 95.6 99.7  PLT 151 Q000111Q*   Basic Metabolic Panel:  Recent Labs Lab 09/13/16 1414 09/14/16 0401 09/14/16 1709 09/15/16 0340  NA 140 139 137 135  K 3.1* 2.8* 3.5 3.8  CL 105 108 108 105  CO2 24 23 22  20*  GLUCOSE 102* 129* 102* 96  BUN 10 8 8 10   CREATININE 0.57* 0.67  0.69 0.66  CALCIUM 9.1 8.8* 8.8* 9.1  MG 1.3* 1.8  --  1.7  PHOS  --  3.0  --  2.1*   GFR: Estimated Creatinine Clearance: 91.3 mL/min (by C-G formula based on SCr of 0.66 mg/dL). Liver Function Tests:  Recent Labs Lab 09/13/16 1414 09/14/16 0401  AST 116* 93*  ALT 66* 58  ALKPHOS 92 88  BILITOT 1.7* 1.4*  PROT 6.7 6.5  ALBUMIN 3.9 3.8   No results for input(s): LIPASE, AMYLASE in the last 168 hours.  Recent Labs Lab 09/13/16 1426  AMMONIA 41*   Coagulation Profile: No results for  input(s): INR, PROTIME in the last 168 hours. Cardiac Enzymes: No results for input(s): CKTOTAL, CKMB, CKMBINDEX, TROPONINI in the last 168 hours. BNP (last 3 results) No results for input(s): PROBNP in the last 8760 hours. HbA1C: No results for input(s): HGBA1C in the last 72 hours. CBG: No results for input(s): GLUCAP in the last 168 hours. Lipid Profile: No results for input(s): CHOL, HDL, LDLCALC, TRIG, CHOLHDL, LDLDIRECT in the last 72 hours. Thyroid Function Tests: No results for input(s): TSH, T4TOTAL, FREET4, T3FREE, THYROIDAB in the last 72 hours. Anemia Panel: No results for input(s): VITAMINB12, FOLATE, FERRITIN, TIBC, IRON, RETICCTPCT in the last 72 hours. Sepsis Labs: No results for input(s): PROCALCITON, LATICACIDVEN in the last 168 hours.  Recent Results (from the past 240 hour(s))  MRSA PCR Screening     Status: None   Collection Time: 09/13/16  6:06 PM  Result Value Ref Range Status   MRSA by PCR NEGATIVE NEGATIVE Final    Comment:        The GeneXpert MRSA Assay (FDA approved for NASAL specimens only), is one component of a comprehensive MRSA colonization surveillance program. It is not intended to diagnose MRSA infection nor to guide or monitor treatment for MRSA infections.          Radiology Studies: Ct Head Wo Contrast  Result Date: 09/13/2016 CLINICAL DATA:  Frequent falls. History of alcohol abuse and Meniere's disease EXAM: CT HEAD WITHOUT CONTRAST TECHNIQUE: Contiguous axial images were obtained from the base of the skull through the vertex without intravenous contrast. COMPARISON:  08/26/2016 FINDINGS: Brain: Mild superficial and moderate central atrophy with sulcal and ventricular prominence unchanged in appearance. Chronic appearing small vessel ischemic disease characterized by. Ventricular and coronal radiata hypodensity is again noted. No large vessel territory infarction, acute intracranial mass, hemorrhage, edema or midline shift. Vascular:  Atherosclerotic calcifications of the cavernous carotid arteries bilaterally. Skull: No acute osseous abnormality. Sinuses/Orbits: Stable chronic left anterior lobe calcifications unchanged in appearance. Chronic left lamina papyracea fracture deformity. The this retention cyst of the left maxillary sinus. Right maxillary sinus mucosal thickening with hypertrophic changes of the maxillary sinus walls consistent chronic sinusitis. Other: None IMPRESSION: 1. 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 Stable cerebral atrophy. 2. No acute intracranial abnormality. 3. Chronic fracture deformity of the left lamina papyracea. 4. Chronic right maxillary sinusitis. Left maxillary mucous retention cysts. Electronically Signed   By: Ashley Royalty M.D.   On: 09/13/2016 14:14   Dg Hips Bilat With Pelvis 3-4 Views  Result Date: 09/13/2016 CLINICAL DATA:  Multiple falls.  Bilateral hip pain. EXAM: DG HIP (WITH OR WITHOUT PELVIS) 3-4V BILAT COMPARISON:  08/24/2016 pelvic radiograph. FINDINGS: No fracture or diastasis in the pelvis. No hip fracture or dislocation. 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 Fiducial markers overlie the prostate. Atherosclerotic change in the internal  iliac arteries. No suspicious focal osseous lesion. IMPRESSION: No fracture.  No hip dislocation. Electronically Signed   By: Ilona Sorrel M.D.   On: 09/13/2016 14:01        Scheduled Meds: . aspirin EC  81 mg Oral Daily  . carvedilol  12.5 mg Oral BID WC  . feeding supplement (ENSURE ENLIVE)  237 mL Oral BID BM  . folic acid  1 mg Oral Daily  . heparin  5,000 Units Subcutaneous Q8H  . mirtazapine  7.5 mg Oral QHS  . multivitamin with minerals  1 tablet Oral Daily  . potassium chloride SA  40 mEq Oral Daily  . potassium phosphate (monobasic)  500 mg Oral TID WC & HS  . sodium chloride flush  3 mL Intravenous Q12H  . thiamine  100 mg Oral Daily   Continuous Infusions: . sodium  chloride 100 mL/hr at 09/14/16 2006     LOS: 2 days    Time spent: 26 minutes    Tyannah Sane Tanna Furry, MD [facility] Pager [phone]  If 7PM-7AM, please contact night-coverage [email or web address] Password [facility] 09/15/2016, 12:48 PM

## 2016-09-15 NOTE — Progress Notes (Signed)
Pt attempting to get out of bed and pull lines stating that he needs to get in his bed. Pt reoriented to being in a bed currently at the hospital.

## 2016-09-15 NOTE — Progress Notes (Addendum)
Repeatedly trying to get out of bed and not responsive to redirection. Ativan given every hour with no decrease in agitation.No safety sitter available at this time. Order obtained for restraints.

## 2016-09-16 LAB — BASIC METABOLIC PANEL
Anion gap: 9 (ref 5–15)
BUN: 11 mg/dL (ref 6–20)
CALCIUM: 8.9 mg/dL (ref 8.9–10.3)
CO2: 20 mmol/L — ABNORMAL LOW (ref 22–32)
CREATININE: 0.69 mg/dL (ref 0.61–1.24)
Chloride: 107 mmol/L (ref 101–111)
Glucose, Bld: 101 mg/dL — ABNORMAL HIGH (ref 65–99)
Potassium: 3.5 mmol/L (ref 3.5–5.1)
SODIUM: 136 mmol/L (ref 135–145)

## 2016-09-16 LAB — MAGNESIUM: MAGNESIUM: 1.5 mg/dL — AB (ref 1.7–2.4)

## 2016-09-16 LAB — PHOSPHORUS: PHOSPHORUS: 2.3 mg/dL — AB (ref 2.5–4.6)

## 2016-09-16 MED ORDER — MAGNESIUM SULFATE 2 GM/50ML IV SOLN
2.0000 g | Freq: Once | INTRAVENOUS | Status: AC
  Administered 2016-09-16: 2 g via INTRAVENOUS
  Filled 2016-09-16: qty 50

## 2016-09-16 NOTE — Progress Notes (Signed)
PROGRESS NOTE    Joshua Henson  [id number] DOB: June 29, 1946 DOA: 09/13/2016 PCP: Mayra Neer, MD    Brief Narrative: 70 year old male with history of alcohol abuse, stroke, coronary artery disease, hypertension, depression, anxiety, liver cirrhosis, hepatic encephalopathy presented after a fall in the setting of alcohol use. Patient drinks alcohol everyday and now monitoring for withdrawal. Psychiatric consult for evaluation of depression, started Remeron.  Assessment & Plan:   # Alcohol abuse and possible alcohol withdrawal delirium:  -continue CIWA protocol. Still requiring high dose of ativan and he is restless.  --Continue Thimentin, folic acid and multivitamin -On Ativan as needed  -Continue supportive care. -Continue to monitor in step down unit. He is also on haldol as needed. He was restless but re-oriented this morning.  -he is not ready to accept education regarding alcohol abuse.   #Depression: started Remeron. Psych consult appreciated.   # Essential hypertension: Continue Coreg. Monitor blood pressure, heart rate. Continue telemetry. Elevated blood pressure probably in the setting of alcohol withdrawal.  #Hypokalemia, hypomagnesemia: Repleted Mg today. Continue to monitor labs and replete electrolytes as needed.   # Hypophosphatemia: added oral Kphos. Phone 2.3 today.  #History liver cirrhosis: Continue supportive care. Ammonia level only mildly elevated on admission.    #Mild thrombocytopenia likely in the setting of liver cirrhosis: No sign of bleeding. Monitor blood counts.  DVT prophylaxis: Heparin subcutaneous Code Status: Full code Family Communication: No family member present at bedside Disposition Plan: Likely discharge home in 1-2 days  Consultants:   Psychiatry consult.    Subjective: Patient was seen and examined at bedside. Patient was restless but reoriented this morning. He reported tired and jittery. Denied headache, nausea, vomiting, chest  pain or shortness of breath. Objective: Vitals:   09/16/16 0424 09/16/16 0724 09/16/16 0800 09/16/16 0837  BP:  (!) 180/112  (!) 170/92  Pulse:      Resp:  (!) 26  (!) 21  Temp: 98.3 F (36.8 C) 99 F (37.2 C) 98.9 F (37.2 C)   TempSrc: Oral Oral Oral   SpO2:    100%  Weight:      Height:        Intake/Output Summary (Last 24 hours) at 09/16/16 1108 Last data filed at 09/16/16 0700  Gross per 24 hour  Intake             1360 ml  Output             1700 ml  Net             -340 ml   Filed Weights   09/13/16 1559 09/13/16 1832  Weight: 81.6 kg (180 lb) 83.3 kg (183 lb 10.3 oz)    Examination:  General exam:Appears restless but reoriented and able to follow commands.  Respiratory system: Clear to auscultation bilateral, no wheezing appreciated Cardiovascular system: Regular rate rhythm, S1-S2 normal. No lower extremity edema. Gastrointestinal system: Abdomen soft, nontender, nondistended. Bowel sound positive. Central nervous system: Alert, awake and following commands. Has upper body and upper extremity tremor. Extremities: Symmetric 5 x 5 power. Skin: A small bruises on the right knee Psychiatry: depression.    Data Reviewed: I have personally reviewed following labs and imaging studies  CBC:  Recent Labs Lab 09/13/16 1414 09/14/16 0401  WBC 5.0 5.2  NEUTROABS 3.3  --   HGB 11.7* 11.5*  HCT 32.7* 32.8*  MCV 95.6 99.7  PLT 151 [id number]*   Basic Metabolic Panel:  Recent Labs Lab 09/13/16 1414 09/14/16  0401 09/14/16 1709 09/15/16 0340 09/15/16 1744 09/16/16 0346  NA 140 139 137 135 134* 136  K 3.1* 2.8* 3.5 3.8 3.6 3.5  CL 105 108 108 105 106 107  CO2 24 23 22  20* 20* 20*  GLUCOSE 102* 129* 102* 96 101* 101*  BUN 10 8 8 10 11 11   CREATININE 0.57* 0.67 0.69 0.66 0.78 0.69  CALCIUM 9.1 8.8* 8.8* 9.1 8.8* 8.9  MG 1.3* 1.8  --  1.7  --  1.5*  PHOS  --  3.0  --  2.1*  --  2.3*   GFR: Estimated Creatinine Clearance: 91.3 mL/min (by C-G formula based on  SCr of 0.69 mg/dL). Liver Function Tests:  Recent Labs Lab 09/13/16 1414 09/14/16 0401  AST 116* 93*  ALT 66* 58  ALKPHOS 92 88  BILITOT 1.7* 1.4*  PROT 6.7 6.5  ALBUMIN 3.9 3.8   No results for input(s): LIPASE, AMYLASE in the last 168 hours.  Recent Labs Lab 09/13/16 1426  AMMONIA 41*   Coagulation Profile: No results for input(s): INR, PROTIME in the last 168 hours. Cardiac Enzymes: No results for input(s): CKTOTAL, CKMB, CKMBINDEX, TROPONINI in the last 168 hours. BNP (last 3 results) No results for input(s): PROBNP in the last 8760 hours. HbA1C: No results for input(s): HGBA1C in the last 72 hours. CBG: No results for input(s): GLUCAP in the last 168 hours. Lipid Profile: No results for input(s): CHOL, HDL, LDLCALC, TRIG, CHOLHDL, LDLDIRECT in the last 72 hours. Thyroid Function Tests: No results for input(s): TSH, T4TOTAL, FREET4, T3FREE, THYROIDAB in the last 72 hours. Anemia Panel: No results for input(s): VITAMINB12, FOLATE, FERRITIN, TIBC, IRON, RETICCTPCT in the last 72 hours. Sepsis Labs: No results for input(s): PROCALCITON, LATICACIDVEN in the last 168 hours.  Recent Results (from the past 240 hour(s))  MRSA PCR Screening     Status: None   Collection Time: 09/13/16  6:06 PM  Result Value Ref Range Status   MRSA by PCR NEGATIVE NEGATIVE Final    Comment:        The GeneXpert MRSA Assay (FDA approved for NASAL specimens only), is one component of a comprehensive MRSA colonization surveillance program. It is not intended to diagnose MRSA infection nor to guide or monitor treatment for MRSA infections.          Radiology Studies: No results found.      Scheduled Meds: . aspirin EC  81 mg Oral Daily  . carvedilol  12.5 mg Oral BID WC  . feeding supplement (ENSURE ENLIVE)  237 mL Oral BID BM  . folic acid  1 mg Oral Daily  . heparin  5,000 Units Subcutaneous Q8H  . mirtazapine  7.5 mg Oral QHS  . multivitamin with minerals  1 tablet  Oral Daily  . potassium chloride SA  40 mEq Oral Daily  . potassium phosphate (monobasic)  500 mg Oral TID WC & HS  . sodium chloride flush  3 mL Intravenous Q12H  . thiamine  100 mg Oral Daily   Continuous Infusions: . sodium chloride 100 mL/hr at 09/15/16 2145     LOS: 3 days    Time spent: 25 minutes    Dron Tanna Furry, MD [facility] Pager [phone]  If 7PM-7AM, please contact night-coverage [email or web address] Password [email or web address] 09/16/2016, 11:08 AM

## 2016-09-17 DIAGNOSIS — H9193 Unspecified hearing loss, bilateral: Secondary | ICD-10-CM

## 2016-09-17 LAB — BASIC METABOLIC PANEL
ANION GAP: 9 (ref 5–15)
BUN: 11 mg/dL (ref 6–20)
CHLORIDE: 112 mmol/L — AB (ref 101–111)
CO2: 19 mmol/L — ABNORMAL LOW (ref 22–32)
Calcium: 9.2 mg/dL (ref 8.9–10.3)
Creatinine, Ser: 0.76 mg/dL (ref 0.61–1.24)
GFR calc Af Amer: >60 mL/min (ref >60)
Glucose, Bld: 109 mg/dL — ABNORMAL HIGH (ref 65–99)
POTASSIUM: 3.9 mmol/L (ref 3.5–5.1)
SODIUM: 140 mmol/L (ref 135–145)

## 2016-09-17 LAB — PHOSPHORUS: PHOSPHORUS: 2.8 mg/dL (ref 2.5–4.6)

## 2016-09-17 LAB — MAGNESIUM: MAGNESIUM: 1.7 mg/dL (ref 1.7–2.4)

## 2016-09-17 MED ORDER — DEXMEDETOMIDINE HCL IN NACL 200 MCG/50ML IV SOLN
0.4000 ug/kg/h | INTRAVENOUS | Status: DC
  Administered 2016-09-17: 0.2 ug/kg/h via INTRAVENOUS
  Filled 2016-09-17: qty 50

## 2016-09-17 MED ORDER — HYDRALAZINE HCL 25 MG PO TABS
25.0000 mg | ORAL_TABLET | Freq: Four times a day (QID) | ORAL | Status: DC | PRN
  Administered 2016-09-18 – 2016-09-19 (×2): 25 mg via ORAL
  Filled 2016-09-17 (×2): qty 1

## 2016-09-17 MED ORDER — HALOPERIDOL LACTATE 5 MG/ML IJ SOLN
5.0000 mg | Freq: Four times a day (QID) | INTRAMUSCULAR | Status: DC | PRN
  Administered 2016-09-19 – 2016-09-22 (×6): 5 mg via INTRAVENOUS
  Filled 2016-09-17 (×6): qty 1

## 2016-09-17 MED ORDER — LORAZEPAM 2 MG/ML IJ SOLN
2.0000 mg | Freq: Four times a day (QID) | INTRAMUSCULAR | Status: DC
  Administered 2016-09-17 – 2016-09-19 (×7): 2 mg via INTRAVENOUS
  Filled 2016-09-17 (×6): qty 1

## 2016-09-17 NOTE — Progress Notes (Signed)
[facility]  PROGRESS NOTE  KAIREN CLASSON [id number] DOB: 06/20/1946 DOA: 09/13/2016 PCP: Mayra Neer, MD   Brief HPI:  70 year old male with history of alcohol abuse, stroke, coronary artery disease, hypertension, depression, anxiety, liver cirrhosis, hepatic encephalopathy presented after a fall in the setting of alcohol use. Patient drinks alcohol everyday and now monitoring for withdrawal. Psychiatric consult for evaluation of depression, started Remeron .   Subjective   Patient continues to be agitated, required Haldol this morning. We'll started on Precedex infusion this morning which has been discontinued all as patient was sedated at low dose of 0.2 mcg/kg/min.   Assessment/Plan:     1. Alcohol withdrawal/delirium tremens- patient has severe alcohol withdrawal symptoms, not controlled with when necessary Ativan. Required Haldol this morning, and continued to have agitation. Started on Precedex infusion, which has been discontinued as patient was sedated that your dose of Precedex. Discussed with Dr. Darliss Ridgel start Ativan scheduled 2 mg IV every 6 hours, Haldol 5 mg every 6 hours when necessary. Continue CIWA protocol. Continue thiamine, folate 2. Depression- patient started on Remeron, psychiatry following 3. Essential hypertension- blood pressure uncontrolled from alcohol withdrawal, continue Coreg. Will start hydralazine 25 mg every 6 hours when necessary 4. Hypomagnesemia-replete, magnesium 1.7 5. Hypophosphatemia-replete, phosphorus 2.8   DVT prophylaxis: Heparin  Code Status: Full code  Family Communication: No family present at bedside   Disposition Plan: Remains in stepdown, pending improvement in delirium   Consultants:  Psychiatry  Procedures:  None  Antibiotics:   Anti-infectives    None       Objective   Vitals:   09/17/16 0413 09/17/16 0800 09/17/16 1000 09/17/16 1100  BP:  (!) 169/108 (!) 169/83 140/82  Pulse:      Resp:    (!) 23 (!) 25  Temp: 98.7 F (37.1 C) 98.9 F (37.2 C)    TempSrc: Oral Oral    SpO2:   98% 98%  Weight:      Height:        Intake/Output Summary (Last 24 hours) at 09/17/16 1129 Last data filed at 09/17/16 0600  Gross per 24 hour  Intake             2480 ml  Output              900 ml  Net             1580 ml   Filed Weights   09/13/16 1559 09/13/16 1832  Weight: 81.6 kg (180 lb) 83.3 kg (183 lb 10.3 oz)     Physical Examination:  General exam: Agitated, tremors Respiratory system: Clear to auscultation. Respiratory effort normal. Cardiovascular system:  RRR. No  murmurs, rubs, gallops. No pedal edema. GI system: Abdomen is nondistended, soft and nontender. No organomegaly.  Central nervous system. No focal neurological deficits. 5 x 5 power in all extremities. Skin: No rashes, lesions or ulcers. Psychiatry: Alert, oriented x 1, agitated, lacks insight and judgment    Data Reviewed: I have personally reviewed following labs and imaging studies  CBG: No results for input(s): GLUCAP in the last 168 hours.  CBC:  Recent Labs Lab 09/13/16 1414 09/14/16 0401  WBC 5.0 5.2  NEUTROABS 3.3  --   HGB 11.7* 11.5*  HCT 32.7* 32.8*  MCV 95.6 99.7  PLT 151 141*    Basic Metabolic Panel:  Recent Labs Lab 09/13/16 1414 09/14/16 0401 09/14/16 1709 09/15/16 0340 09/15/16 1744 09/16/16 0346 09/17/16 0321 09/17/16 1025  NA 140 139  137 135 134* 136 140  --   K 3.1* 2.8* 3.5 3.8 3.6 3.5 3.9  --   CL 105 108 108 105 106 107 112*  --   CO2 24 23 22  20* 20* 20* 19*  --   GLUCOSE 102* 129* 102* 96 101* 101* 109*  --   BUN 10 8 8 10 11 11 11   --   CREATININE 0.57* 0.67 0.69 0.66 0.78 0.69 0.76  --   CALCIUM 9.1 8.8* 8.8* 9.1 8.8* 8.9 9.2  --   MG 1.3* 1.8  --  1.7  --  1.5*  --  1.7  PHOS  --  3.0  --  2.1*  --  2.3*  --  2.8    Recent Results (from the past 240 hour(s))  MRSA PCR Screening     Status: None   Collection Time: 09/13/16  6:06 PM  Result Value Ref  Range Status   MRSA by PCR NEGATIVE NEGATIVE Final    Comment:        The GeneXpert MRSA Assay (FDA approved for NASAL specimens only), is one component of a comprehensive MRSA colonization surveillance program. It is not intended to diagnose MRSA infection nor to guide or monitor treatment for MRSA infections.      Liver Function Tests:  Recent Labs Lab 09/13/16 1414 09/14/16 0401  AST 116* 93*  ALT 66* 58  ALKPHOS 92 88  BILITOT 1.7* 1.4*  PROT 6.7 6.5  ALBUMIN 3.9 3.8   No results for input(s): LIPASE, AMYLASE in the last 168 hours.  Recent Labs Lab 09/13/16 1426  AMMONIA 41*    Cardiac Enzymes: No results for input(s): CKTOTAL, CKMB, CKMBINDEX, TROPONINI in the last 168 hours. BNP (last 3 results) No results for input(s): BNP in the last 8760 hours.  ProBNP (last 3 results) No results for input(s): PROBNP in the last 8760 hours.    Studies: No results found.  Scheduled Meds: . aspirin EC  81 mg Oral Daily  . carvedilol  12.5 mg Oral BID WC  . feeding supplement (ENSURE ENLIVE)  237 mL Oral BID BM  . folic acid  1 mg Oral Daily  . heparin  5,000 Units Subcutaneous Q8H  . LORazepam  2 mg Intravenous Q6H  . mirtazapine  7.5 mg Oral QHS  . multivitamin with minerals  1 tablet Oral Daily  . potassium chloride SA  40 mEq Oral Daily  . potassium phosphate (monobasic)  500 mg Oral TID WC & HS  . sodium chloride flush  3 mL Intravenous Q12H  . thiamine  100 mg Oral Daily    Continuous Infusions: . sodium chloride 100 mL/hr at 09/15/16 2145    Time spent: 20 min  [facility] Pager [phone]. If 7PM-7AM, please contact night-coverage at [email or web address], Office  [phone]  password [email or web address] 09/17/2016, 11:29 AM  LOS: 4 days

## 2016-09-18 LAB — COMPREHENSIVE METABOLIC PANEL
ALBUMIN: 2.8 g/dL — AB (ref 3.5–5.0)
ALT: 35 U/L (ref 17–63)
AST: 42 U/L — AB (ref 15–41)
Alkaline Phosphatase: 69 U/L (ref 38–126)
Anion gap: 6 (ref 5–15)
BUN: 16 mg/dL (ref 6–20)
CHLORIDE: 110 mmol/L (ref 101–111)
CO2: 22 mmol/L (ref 22–32)
Calcium: 9.3 mg/dL (ref 8.9–10.3)
Creatinine, Ser: 0.68 mg/dL (ref 0.61–1.24)
GFR calc Af Amer: >60 mL/min (ref >60)
Glucose, Bld: 141 mg/dL — ABNORMAL HIGH (ref 65–99)
POTASSIUM: 3.7 mmol/L (ref 3.5–5.1)
SODIUM: 138 mmol/L (ref 135–145)
Total Bilirubin: 0.9 mg/dL (ref 0.3–1.2)
Total Protein: 6.1 g/dL — ABNORMAL LOW (ref 6.5–8.1)

## 2016-09-18 LAB — CBC
HCT: 28.3 % — ABNORMAL LOW (ref 39.0–52.0)
Hemoglobin: 10.2 g/dL — ABNORMAL LOW (ref 13.0–17.0)
MCH: 35.7 pg — ABNORMAL HIGH (ref 26.0–34.0)
MCHC: 36 g/dL (ref 30.0–36.0)
MCV: 99 fL (ref 78.0–100.0)
PLATELETS: 151 10*3/uL (ref 150–400)
RBC: 2.86 MIL/uL — AB (ref 4.22–5.81)
RDW: 16.5 % — AB (ref 11.5–15.5)
WBC: 4.9 10*3/uL (ref 4.0–10.5)

## 2016-09-18 NOTE — Progress Notes (Signed)
PROGRESS NOTE    Joshua Henson  [id number] DOB: [birth year] DOA: 09/13/2016 PCP: Mayra Neer, MD    Brief Narrative: 70 year old male with history of alcohol abuse, stroke, coronary artery disease, hypertension, depression, anxiety, liver cirrhosis, hepatic encephalopathy presented after a fall in the setting of alcohol use. Patient drinks alcohol everyday and now monitoring for withdrawal. Psychiatric consult for evaluation of depression, started Remeron.  Assessment & Plan:   # Alcohol withdrawals and delirium tremens:  Requiring scheduled Ativan and as needed Haldol and Ativan as needed further symptom management. Patient is still withdrawing. Continue CIWA protocol, vitamins and IV fluid. Continue supportive care in monitor setting today. -We will monitor if patient needs Precedex drip. So far he is doing okay with Ativan and Haldol.   #Depression: Evaluated by psychiatrist, started on Remeron.  # Essential hypertension: Elevated blood pressure likely in the setting of  alcohol withdrawal. Continue Coreg. Patient is also on hydralazine as needed for the management of high blood pressure.  #Hypokalemia, hypomagnesemia: Monitor electrolytes and replete as needed. Continue potassium chloride.  # Hypophosphatemia: On potassium phosphate. Repeat phosphorus level tomorrow morning.  #History liver cirrhosis: Continue supportive care. Ammonia level only mildly elevated on admission.    #Mild thrombocytopenia likely in the setting of liver cirrhosis: No sign of bleeding. Monitor blood counts.  DVT prophylaxis: Heparin subcutaneous Code Status: Full code Family Communication: No family member present at bedside Disposition Plan: Likely discharge home in 1-2 days  Consultants:   Psychiatry consult.    Subjective: Patient was seen and examined at bedside. Patient overall looks improving with some mild agitation and tremors. Reports generalized body pain. Denied chest pain, shortness  of breath, nausea or vomiting.  Objective: Vitals:   09/18/16 0000 09/18/16 0334 09/18/16 0427 09/18/16 0800  BP: (!) 160/77  (!) 161/93 (!) 188/103  Pulse:      Resp: 20  19 (!) 21  Temp:  100 F (37.8 C)    TempSrc:  Oral    SpO2: 99%  95% 97%  Weight:      Height:        Intake/Output Summary (Last 24 hours) at 09/18/16 1058 Last data filed at 09/18/16 1000  Gross per 24 hour  Intake           2610.5 ml  Output             1050 ml  Net           1560.5 ml   Filed Weights   09/13/16 1559 09/13/16 1832  Weight: 81.6 kg (180 lb) 83.3 kg (183 lb 10.3 oz)    Examination:  General exam:Appears comfortable with intermittent mild agitation.  Respiratory system: Clear to auscultation bilateral, no wheezing or crackles.  Cardiovascular system: Regular rate rhythm, S1-S2 normal. No lower extremity edema. Gastrointestinal system: Abdomen soft, nontender, nondistended. Bowel sounds positive. Central nervous system: Alert, awake and following commands. Has upper extremity tremors.. Skin: A small bruises on the right knee, unchanged Psychiatry: depression.    Data Reviewed: I have personally reviewed following labs and imaging studies  CBC:  Recent Labs Lab 09/13/16 1414 09/14/16 0401 09/18/16 0318  WBC 5.0 5.2 4.9  NEUTROABS 3.3  --   --   HGB 11.7* 11.5* 10.2*  HCT 32.7* 32.8* 28.3*  MCV 95.6 99.7 99.0  PLT 151 141* [id number]   Basic Metabolic Panel:  Recent Labs Lab 09/13/16 1414 09/14/16 0401  09/15/16 0340 09/15/16 1744 09/16/16 0346 09/17/16 0321 09/17/16 1025 09/18/16  0318  NA 140 139  < > 135 134* 136 140  --  138  K 3.1* 2.8*  < > 3.8 3.6 3.5 3.9  --  3.7  CL 105 108  < > 105 106 107 112*  --  110  CO2 24 23  < > 20* 20* 20* 19*  --  22  GLUCOSE 102* 129*  < > 96 101* 101* 109*  --  141*  BUN 10 8  < > 10 11 11 11   --  16  CREATININE 0.57* 0.67  < > 0.66 0.78 0.69 0.76  --  0.68  CALCIUM 9.1 8.8*  < > 9.1 8.8* 8.9 9.2  --  9.3  MG 1.3* 1.8  --  1.7   --  1.5*  --  1.7  --   PHOS  --  3.0  --  2.1*  --  2.3*  --  2.8  --   < > = values in this interval not displayed. GFR: Estimated Creatinine Clearance: 91.3 mL/min (by C-G formula based on SCr of 0.68 mg/dL). Liver Function Tests:  Recent Labs Lab 09/13/16 1414 09/14/16 0401 09/18/16 0318  AST 116* 93* 42*  ALT 66* 58 35  ALKPHOS 92 88 69  BILITOT 1.7* 1.4* 0.9  PROT 6.7 6.5 6.1*  ALBUMIN 3.9 3.8 2.8*   No results for input(s): LIPASE, AMYLASE in the last 168 hours.  Recent Labs Lab 09/13/16 1426  AMMONIA 41*   Coagulation Profile: No results for input(s): INR, PROTIME in the last 168 hours. Cardiac Enzymes: No results for input(s): CKTOTAL, CKMB, CKMBINDEX, TROPONINI in the last 168 hours. BNP (last 3 results) No results for input(s): PROBNP in the last 8760 hours. HbA1C: No results for input(s): HGBA1C in the last 72 hours. CBG: No results for input(s): GLUCAP in the last 168 hours. Lipid Profile: No results for input(s): CHOL, HDL, LDLCALC, TRIG, CHOLHDL, LDLDIRECT in the last 72 hours. Thyroid Function Tests: No results for input(s): TSH, T4TOTAL, FREET4, T3FREE, THYROIDAB in the last 72 hours. Anemia Panel: No results for input(s): VITAMINB12, FOLATE, FERRITIN, TIBC, IRON, RETICCTPCT in the last 72 hours. Sepsis Labs: No results for input(s): PROCALCITON, LATICACIDVEN in the last 168 hours.  Recent Results (from the past 240 hour(s))  MRSA PCR Screening     Status: None   Collection Time: 09/13/16  6:06 PM  Result Value Ref Range Status   MRSA by PCR NEGATIVE NEGATIVE Final    Comment:        The GeneXpert MRSA Assay (FDA approved for NASAL specimens only), is one component of a comprehensive MRSA colonization surveillance program. It is not intended to diagnose MRSA infection nor to guide or monitor treatment for MRSA infections.          Radiology Studies: No results found.      Scheduled Meds: . aspirin EC  81 mg Oral Daily  .  carvedilol  12.5 mg Oral BID WC  . feeding supplement (ENSURE ENLIVE)  237 mL Oral BID BM  . folic acid  1 mg Oral Daily  . heparin  5,000 Units Subcutaneous Q8H  . LORazepam  2 mg Intravenous Q6H  . mirtazapine  7.5 mg Oral QHS  . multivitamin with minerals  1 tablet Oral Daily  . potassium chloride SA  40 mEq Oral Daily  . potassium phosphate (monobasic)  500 mg Oral TID WC & HS  . sodium chloride flush  3 mL Intravenous Q12H  . thiamine  100 mg Oral Daily   Continuous Infusions: . sodium chloride 100 mL/hr at 09/18/16 1000     LOS: 5 days    Time spent: 26 minutes    Alfretta Pinch Tanna Furry, MD [facility] Pager [phone]  If 7PM-7AM, please contact night-coverage [email or web address] Password [email or web address] 09/18/2016, 10:58 AM

## 2016-09-18 NOTE — Progress Notes (Signed)
Per day shift nurse, nurse had conversation with doctor concerning a suicide sitter. Nurse and MD agreed that suicide sitter is not needed at this time. Patient has an order for a Air cabin crew, if needed. Current nurse has not witnessed any verbal ideations of suicide or homicide. Patient now calm and sleeping. Will continue to monitor patient.

## 2016-09-19 LAB — BASIC METABOLIC PANEL
ANION GAP: 10 (ref 5–15)
BUN: 12 mg/dL (ref 6–20)
CALCIUM: 8.8 mg/dL — AB (ref 8.9–10.3)
CO2: 21 mmol/L — ABNORMAL LOW (ref 22–32)
Chloride: 106 mmol/L (ref 101–111)
Creatinine, Ser: 0.67 mg/dL (ref 0.61–1.24)
Glucose, Bld: 105 mg/dL — ABNORMAL HIGH (ref 65–99)
POTASSIUM: 3.5 mmol/L (ref 3.5–5.1)
SODIUM: 137 mmol/L (ref 135–145)

## 2016-09-19 LAB — PHOSPHORUS: PHOSPHORUS: 2.9 mg/dL (ref 2.5–4.6)

## 2016-09-19 LAB — MAGNESIUM: Magnesium: 1.5 mg/dL — ABNORMAL LOW (ref 1.7–2.4)

## 2016-09-19 MED ORDER — HYDRALAZINE HCL 25 MG PO TABS
25.0000 mg | ORAL_TABLET | Freq: Three times a day (TID) | ORAL | Status: DC
  Administered 2016-09-19 – 2016-09-23 (×12): 25 mg via ORAL
  Filled 2016-09-19 (×12): qty 1

## 2016-09-19 MED ORDER — LORAZEPAM 1 MG PO TABS
2.0000 mg | ORAL_TABLET | Freq: Three times a day (TID) | ORAL | Status: DC
  Administered 2016-09-19 – 2016-09-22 (×9): 2 mg via ORAL
  Filled 2016-09-19 (×9): qty 2

## 2016-09-19 MED ORDER — MAGNESIUM SULFATE 2 GM/50ML IV SOLN
2.0000 g | Freq: Once | INTRAVENOUS | Status: AC
  Administered 2016-09-19: 2 g via INTRAVENOUS
  Filled 2016-09-19: qty 50

## 2016-09-19 NOTE — Progress Notes (Signed)
Patient is sleeping at this time.

## 2016-09-19 NOTE — Progress Notes (Signed)
PROGRESS NOTE    Joshua Henson  [id number] DOB: 1946-01-07 DOA: 09/13/2016 PCP: Mayra Neer, MD    Brief Narrative: 70 year old male with history of alcohol abuse, stroke, coronary artery disease, hypertension, depression, anxiety, liver cirrhosis, hepatic encephalopathy presented after a fall in the setting of alcohol use. Patient drinks alcohol everyday and now monitoring for withdrawal. Psychiatric consulted for evaluation of depression, started Remeron.  Assessment & Plan:   # Alcohol withdrawals and delirium tremens:  Clinically improving. Will change IV ativan to Po. Transfer from step down to tele floor today. Still has tremor and some signs of withdrawal.  -Continue CIWA protocol, vitamins and IV fluid.  -continue safety precaution.  - #Depression: Evaluated by psychiatrist, started on Remeron. Denied suicidal ideation today.   # Essential hypertension: Elevated blood pressure likely in the setting of  alcohol withdrawal. Continue Coreg. I will add oral hydralazine 3 times a day today. Patient is also on hydralazine as needed for the management of high blood pressure.  #Hypokalemia, hypomagnesemia: Monitor electrolytes and replete as needed. Continue potassium chloride. -Replete Mag sulfate today.  # Hypophosphatemia: On potassium phosphate. Repeat phosphorus level 2.9 today.  #History liver cirrhosis: Continue supportive care. Ammonia level only mildly elevated on admission.    #Mild thrombocytopenia likely in the setting of liver cirrhosis: No sign of bleeding. Monitor blood counts.  DVT prophylaxis: Heparin subcutaneous Code Status: Full code Family Communication: No family member present at bedside Disposition Plan: Likely discharge home in 1-2 days  Consultants:   Psychiatry consult.    Subjective: Patient was seen and examined at bedside. Reports tired with generalized body pain. He is still shaky. Denied nausea vomiting or abdominal pain. No chest pain or  shortness of breath. He denied suicidal or homicidal ideation. Objective: Vitals:   09/19/16 0600 09/19/16 0800 09/19/16 0900 09/19/16 1000  BP: (!) 168/107 (!) 189/111  (!) 184/133  Pulse:      Resp: 20 17 (!) 21 19  Temp:  99.2 F (37.3 C)    TempSrc:  Oral    SpO2: 91% 91% 96% 94%  Weight:      Height:        Intake/Output Summary (Last 24 hours) at 09/19/16 1227 Last data filed at 09/19/16 1000  Gross per 24 hour  Intake             2400 ml  Output              500 ml  Net             1900 ml   Filed Weights   09/13/16 1559 09/13/16 1832  Weight: 81.6 kg (180 lb) 83.3 kg (183 lb 10.3 oz)    Examination:  General exam:Appears less agitated and distressed today. Respiratory system: Bilateral clear to auscultation, no wheezing. Cardiovascular system: Regular rate and rhythm, S1-S2 normal. No lower extremity edema. Gastrointestinal system: Bowel sound positive. Abdomen soft, nontender and nondistended. Central nervous system: Has upper extremity tremor. Alert, awake and following commands Skin: A small bruises on the right knee, unchanged Psychiatry: depression. Denied suicidal ideation today.    Data Reviewed: I have personally reviewed following labs and imaging studies  CBC:  Recent Labs Lab 09/13/16 1414 09/14/16 0401 09/18/16 0318  WBC 5.0 5.2 4.9  NEUTROABS 3.3  --   --   HGB 11.7* 11.5* 10.2*  HCT 32.7* 32.8* 28.3*  MCV 95.6 99.7 99.0  PLT 151 141* [id number]   Basic Metabolic Panel:  Recent Labs  Lab 09/14/16 0401  09/15/16 0340 09/15/16 1744 09/16/16 0346 09/17/16 0321 09/17/16 1025 09/18/16 0318 09/19/16 0310  NA 139  < > 135 134* 136 140  --  138 137  K 2.8*  < > 3.8 3.6 3.5 3.9  --  3.7 3.5  CL 108  < > 105 106 107 112*  --  110 106  CO2 23  < > 20* 20* 20* 19*  --  22 21*  GLUCOSE 129*  < > 96 101* 101* 109*  --  141* 105*  BUN 8  < > 10 11 11 11   --  16 12  CREATININE 0.67  < > 0.66 0.78 0.69 0.76  --  0.68 0.67  CALCIUM 8.8*  < > 9.1  8.8* 8.9 9.2  --  9.3 8.8*  MG 1.8  --  1.7  --  1.5*  --  1.7  --  1.5*  PHOS 3.0  --  2.1*  --  2.3*  --  2.8  --  2.9  < > = values in this interval not displayed. GFR: Estimated Creatinine Clearance: 91.3 mL/min (by C-G formula based on SCr of 0.67 mg/dL). Liver Function Tests:  Recent Labs Lab 09/13/16 1414 09/14/16 0401 09/18/16 0318  AST 116* 93* 42*  ALT 66* 58 35  ALKPHOS 92 88 69  BILITOT 1.7* 1.4* 0.9  PROT 6.7 6.5 6.1*  ALBUMIN 3.9 3.8 2.8*   No results for input(s): LIPASE, AMYLASE in the last 168 hours.  Recent Labs Lab 09/13/16 1426  AMMONIA 41*   Coagulation Profile: No results for input(s): INR, PROTIME in the last 168 hours. Cardiac Enzymes: No results for input(s): CKTOTAL, CKMB, CKMBINDEX, TROPONINI in the last 168 hours. BNP (last 3 results) No results for input(s): PROBNP in the last 8760 hours. HbA1C: No results for input(s): HGBA1C in the last 72 hours. CBG: No results for input(s): GLUCAP in the last 168 hours. Lipid Profile: No results for input(s): CHOL, HDL, LDLCALC, TRIG, CHOLHDL, LDLDIRECT in the last 72 hours. Thyroid Function Tests: No results for input(s): TSH, T4TOTAL, FREET4, T3FREE, THYROIDAB in the last 72 hours. Anemia Panel: No results for input(s): VITAMINB12, FOLATE, FERRITIN, TIBC, IRON, RETICCTPCT in the last 72 hours. Sepsis Labs: No results for input(s): PROCALCITON, LATICACIDVEN in the last 168 hours.  Recent Results (from the past 240 hour(s))  MRSA PCR Screening     Status: None   Collection Time: 09/13/16  6:06 PM  Result Value Ref Range Status   MRSA by PCR NEGATIVE NEGATIVE Final    Comment:        The GeneXpert MRSA Assay (FDA approved for NASAL specimens only), is one component of a comprehensive MRSA colonization surveillance program. It is not intended to diagnose MRSA infection nor to guide or monitor treatment for MRSA infections.          Radiology Studies: No results  found.      Scheduled Meds: . aspirin EC  81 mg Oral Daily  . carvedilol  12.5 mg Oral BID WC  . feeding supplement (ENSURE ENLIVE)  237 mL Oral BID BM  . folic acid  1 mg Oral Daily  . heparin  5,000 Units Subcutaneous Q8H  . LORazepam  2 mg Oral Q8H  . mirtazapine  7.5 mg Oral QHS  . multivitamin with minerals  1 tablet Oral Daily  . potassium chloride SA  40 mEq Oral Daily  . potassium phosphate (monobasic)  500 mg Oral TID WC &  HS  . sodium chloride flush  3 mL Intravenous Q12H  . thiamine  100 mg Oral Daily   Continuous Infusions: . sodium chloride 100 mL/hr at 09/19/16 1000     LOS: 6 days    Time spent: 24 minutes    Falesha Schommer Tanna Furry, MD [facility] Pager [phone]  If 7PM-7AM, please contact night-coverage [email or web address] Password [facility] 09/19/2016, 12:27 PM

## 2016-09-20 ENCOUNTER — Inpatient Hospital Stay (HOSPITAL_COMMUNITY): Payer: Medicare Other

## 2016-09-20 DIAGNOSIS — R509 Fever, unspecified: Secondary | ICD-10-CM

## 2016-09-20 LAB — RESPIRATORY PANEL BY PCR
Adenovirus: NOT DETECTED
BORDETELLA PERTUSSIS-RVPCR: NOT DETECTED
CHLAMYDOPHILA PNEUMONIAE-RVPPCR: NOT DETECTED
CORONAVIRUS 229E-RVPPCR: NOT DETECTED
Coronavirus HKU1: NOT DETECTED
Coronavirus NL63: NOT DETECTED
Coronavirus OC43: NOT DETECTED
INFLUENZA B-RVPPCR: NOT DETECTED
Influenza A: NOT DETECTED
MYCOPLASMA PNEUMONIAE-RVPPCR: NOT DETECTED
Metapneumovirus: NOT DETECTED
Parainfluenza Virus 1: NOT DETECTED
Parainfluenza Virus 2: NOT DETECTED
Parainfluenza Virus 3: NOT DETECTED
Parainfluenza Virus 4: NOT DETECTED
RESPIRATORY SYNCYTIAL VIRUS-RVPPCR: NOT DETECTED
Rhinovirus / Enterovirus: NOT DETECTED

## 2016-09-20 LAB — URINALYSIS, ROUTINE W REFLEX MICROSCOPIC
Bilirubin Urine: NEGATIVE
Glucose, UA: NEGATIVE mg/dL
Hgb urine dipstick: NEGATIVE
Ketones, ur: NEGATIVE mg/dL
Nitrite: NEGATIVE
PROTEIN: NEGATIVE mg/dL
SPECIFIC GRAVITY, URINE: 1.014 (ref 1.005–1.030)
pH: 6.5 (ref 5.0–8.0)

## 2016-09-20 LAB — INFLUENZA PANEL BY PCR (TYPE A & B)
H1N1 flu by pcr: NOT DETECTED
INFLBPCR: NEGATIVE
Influenza A By PCR: NEGATIVE

## 2016-09-20 LAB — BASIC METABOLIC PANEL
ANION GAP: 7 (ref 5–15)
BUN: 10 mg/dL (ref 6–20)
CHLORIDE: 108 mmol/L (ref 101–111)
CO2: 23 mmol/L (ref 22–32)
CREATININE: 0.73 mg/dL (ref 0.61–1.24)
Calcium: 9 mg/dL (ref 8.9–10.3)
GFR calc non Af Amer: >60 mL/min (ref >60)
GLUCOSE: 100 mg/dL — AB (ref 65–99)
Potassium: 3.6 mmol/L (ref 3.5–5.1)
Sodium: 138 mmol/L (ref 135–145)

## 2016-09-20 LAB — URINE MICROSCOPIC-ADD ON

## 2016-09-20 LAB — MAGNESIUM: Magnesium: 2.1 mg/dL (ref 1.7–2.4)

## 2016-09-20 LAB — PHOSPHORUS: PHOSPHORUS: 3.6 mg/dL (ref 2.5–4.6)

## 2016-09-20 MED ORDER — LEVOFLOXACIN 500 MG PO TABS
500.0000 mg | ORAL_TABLET | Freq: Every day | ORAL | Status: DC
  Administered 2016-09-20 – 2016-09-23 (×4): 500 mg via ORAL
  Filled 2016-09-20 (×4): qty 1

## 2016-09-20 NOTE — Progress Notes (Signed)
Nutrition Follow-up  DOCUMENTATION CODES:   Not applicable  INTERVENTION:  - Continue Ensure Enlive BID. - Continue to encourage PO intakes of meals and supplements. - RD will continue to monitor for needs.  NUTRITION DIAGNOSIS:   Inadequate oral intake related to poor appetite, social / environmental circumstances as evidenced by per patient/family report. -likely ongoing   GOAL:   Patient will meet greater than or equal to 90% of their needs -likely unmet  MONITOR:   PO intake, Supplement acceptance, Weight trends, Labs, Skin, I & O's  ASSESSMENT:   70 y.o. male with medical history significant of alcohol abuse, stroke, and STEMI, hypertension, depression, anxiety, alcohol cirrhosis, hepatic encephalopathy. Patient reports that early this morning around 4 AM, he fell down backwards. He reports no the time. His girlfriend found him at around 6:00 AM and wanted to bring him to the emergency room. He refused at that time. Symptoms of weakness continued and girlfriend eventually can since patient to be evaluated in the emergency department. Patient drinks liquor daily. Last drink was yesterday. According to the EDP report, patient's girlfriend has been diluting his alcohol.  10/3 Per chart review, pt consumed 50% of breakfast this AM and no other intakes documented since previous RD visit. Pt sleeping soundly at this time and did not awake to name call x4; no family/visitors present. Lunch tray delivered shortly before RD visit and is untouched. Other snacks present on bedside table. Ensure Enlive with ~25% completion on bedside table. No new weight since 09/13/16.  RD will continue to monitor for additional nutrition-related needs. Continue to encourage PO intakes.   Medications reviewed; 1 mg oral folic acid/day, 2 g IV Mg sulfate x1 dose yesterday, daily multivitamin with minerals, PRN IV Zofran, 40 mEq oral KCl/day, 100 mg oral thiamine/day.  Labs reviewed. IVF: NS @ 100 mL/hr.      9/27 - Per chart review, pt consumed 100% of meal at 0253 today which pt reports was a grilled chicken sandwich.  - He states he has not eaten anything other than the sandwich in 2 days.  - Prior to that he had a "fair" appetite for unknown amount of time but states that he would not eat breakfast, some days he would eat lunch and some days he would not, and he ate dinner every night. - No problems with chewing but sometimes feels that items are difficult to swallow; drinking fluids with bites of food helps relieve this sensation.  - Pt states that he likes Ensure.  - Per rounds this AM, pt in active withdrawal. Pt denies tremors/shakiness to hands.  - Pt denies abdominal pain or nausea at this time.  - Unable to complete physical assessment. - Pt reports that over the past 6-8 months he has lost 15 lbs.  - This is consistent with chart review which indicates 13 lb weight loss (7% body weight) in the past 5 months which is not significant for time frame. W  IVF: NS @ 100 mL/hr.     Diet Order:  Diet Heart Room service appropriate? Yes; Fluid consistency: Thin  Skin:     Last BM:  10/2  Height:   Ht Readings from Last 1 Encounters:  09/13/16 5\' 10"  (1.778 m)    Weight:   Wt Readings from Last 1 Encounters:  09/13/16 183 lb 10.3 oz (83.3 kg)    Ideal Body Weight:  75.45 kg  BMI:  Body mass index is 26.35 kg/m.  Estimated Nutritional Needs:  Kcal:  1600-1800  Protein:  70-80 grams  Fluid:  >/= 1.8 L/day  EDUCATION NEEDS:   No education needs identified at this time    Jarome Matin, MS, RD, LDN Inpatient Clinical Dietitian Pager # [phone] After hours/weekend pager # [phone]

## 2016-09-20 NOTE — Care Management Note (Signed)
Case Management Note  Patient Details  Name: Joshua Henson MRN: [id number] Date of Birth: 1946/02/11  Subjective/Objective:  Transfer. 70 y/o m admitted w/ETOH w/drawal. From home.PT cons-await recc.                  Action/Plan:d/c plan home.   Expected Discharge Date:   (unknown)               Expected Discharge Plan:     In-House Referral:     Discharge planning Services  CM Consult  Post Acute Care Choice:    Choice offered to:     DME Arranged:    DME Agency:     HH Arranged:    HH Agency:     Status of Service:  In process, will continue to follow  If discussed at Long Length of Stay Meetings, dates discussed:    Additional Comments:  Dessa Phi, RN 09/20/2016, 11:17 AM

## 2016-09-20 NOTE — Evaluation (Signed)
Physical Therapy Evaluation Patient Details Name: Joshua Henson MRN: [id number] DOB: [birth year] Today's Date: 09/20/2016   History of Present Illness  70 y.o. male admitted with fall, EtOH withdrawal. PMH of prostate cancer, CVA, depression, HTN, alcoholic cirrhosis, hepatic encephalopathy  Clinical Impression  Pt admitted with above diagnosis. Pt currently with functional limitations due to the deficits listed below (see PT Problem List). Pt ambulated 100' with RW and min/guard assist. He requires mod assist for bed mobility. At present he would need 24* supervision due to confusion. Pt will benefit from skilled PT to increase their independence and safety with mobility to allow discharge to the venue listed below.       Follow Up Recommendations Home health PT or ST-SNF if family not able to provide 24* supervision    Equipment Recommendations  None recommended by PT    Recommendations for Other Services OT consult     Precautions / Restrictions Precautions Precautions: Fall Precaution Comments: fell PTA per chart, pt denies this Restrictions Weight Bearing Restrictions: No      Mobility  Bed Mobility Overal bed mobility: Needs Assistance Bed Mobility: Supine to Sit     Supine to sit: Mod assist     General bed mobility comments: assist to raise trunk  Transfers Overall transfer level: Needs assistance Equipment used: Rolling walker (2 wheeled) Transfers: Sit to/from Stand Sit to Stand: From elevated surface;Min assist         General transfer comment: assist to rise  Ambulation/Gait Ambulation/Gait assistance: Min guard Ambulation Distance (Feet): 100 Feet Assistive device: Rolling walker (2 wheeled) Gait Pattern/deviations: Step-to pattern;Decreased step length - right;Decreased step length - left;Trunk flexed Gait velocity: decr Gait velocity interpretation: Below normal speed for age/gender General Gait Details: VCs to increase step length and for  positioning in RW, no LOB  Stairs            Wheelchair Mobility    Modified Rankin (Stroke Patients Only)       Balance Overall balance assessment: History of Falls;Needs assistance   Sitting balance-Leahy Scale: Good       Standing balance-Leahy Scale: Fair                               Pertinent Vitals/Pain Pain Assessment: No/denies pain    Home Living Family/patient expects to be discharged to:: Private residence Living Arrangements: Spouse/significant other Available Help at Discharge: Family;Available PRN/intermittently   Home Access: Stairs to enter Entrance Stairs-Rails: Right;Left Entrance Stairs-Number of Steps: 3 Home Layout: Two level;Able to live on main level with bedroom/bathroom Home Equipment: Gilford Rile - 2 wheels;Shower seat - built in;Bedside commode;Grab bars - tub/shower Additional Comments: stays on first level    Prior Function Level of Independence: Independent         Comments: criminal lawyer, 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     Hand Dominance        Extremity/Trunk Assessment   Upper Extremity Assessment: Defer to OT evaluation           Lower Extremity Assessment: Generalized weakness (4/5 grossly)      Cervical / Trunk Assessment: Normal  Communication   Communication: HOH  Cognition Arousal/Alertness: Awake/alert Behavior During Therapy: WFL for tasks assessed/performed Overall Cognitive Status: No family/caregiver present to determine baseline cognitive functioning       Memory: Decreased short-term memory  General Comments      Exercises     Assessment/Plan    PT Assessment Patient needs continued PT services  PT Problem List Decreased strength;Decreased activity tolerance;Decreased balance;Decreased knowledge of use of DME;Decreased mobility          PT Treatment Interventions DME instruction;Gait training;Stair training;Functional  mobility training;Therapeutic exercise;Therapeutic activities;Patient/family education    PT Goals (Current goals can be found in the Care Plan section)  Acute Rehab PT Goals Patient Stated Goal: to get up and walk more PT Goal Formulation: With patient Time For Goal Achievement: 10/04/16 Potential to Achieve Goals: Good    Frequency Min 3X/week   Barriers to discharge Decreased caregiver support girlfriend works as a Firefighter During Treatment: Gait belt Activity Tolerance: Patient tolerated treatment well Patient left: in chair;with call bell/phone within reach;with chair alarm set Nurse Communication: Mobility status         Time: 1254-1320 PT Time Calculation (min) (ACUTE ONLY): 26 min   Charges:   PT Evaluation $PT Eval Low Complexity: 1 Procedure PT Treatments $Gait Training: 8-22 mins   PT G Codes:        Philomena Doheny 09/20/2016, 1:37 PM [phone]

## 2016-09-20 NOTE — Progress Notes (Signed)
Occupational Therapy Evaluation Patient Details Name: Joshua Henson MRN: [id number] DOB: 27-Nov-1946 Today's Date: 09/20/2016    History of Present Illness 70 y.o. male admitted with fall, EtOH withdrawal. PMH of prostate cancer, CVA, depression, HTN, alcoholic cirrhosis, hepatic encephalopathy   Clinical Impression   Patient presents to OT with decreased ADL independence due to the deficits listed below. He will benefit from skilled OT to maximize function and to facilitate a safe discharge. OT will follow.    Follow Up Recommendations  Supervision/Assistance - 24 hour;Home health OT (if no 24/7 A available, recommend SNF)   Equipment Recommendations  3 in 1 bedside comode    Recommendations for Other Services       Precautions / Restrictions Precautions Precautions: Fall Precaution Comments: fell PTA per chart, pt denies this Restrictions Weight Bearing Restrictions: No      Mobility Bed Mobility            General bed mobility comments: NT -- up in recliner  Transfers Overall transfer level: Needs assistance Equipment used: Rolling walker (2 wheeled) Transfers: Sit to/from Stand Sit to Stand: Mod assist         General transfer comment: assist to rise and stabilize. Heavy posterior lean    Balance Overall balance assessment: History of Falls                                        ADL Overall ADL's : Needs assistance/impaired Eating/Feeding: Set up;Sitting   Grooming: Wash/dry hands;Wash/dry face;Set up;Sitting                 Lower Body Dressing Details (indicate cue type and reason): adjusted socks with min A (crosses each foot over opposite knee) Toilet Transfer: Minimal assistance;Moderate assistance;Stand-pivot;BSC;RW           Functional mobility during ADLs: Minimal assistance;Moderate assistance;Cueing for safety;Rolling walker General ADL Comments: Patient pleasantly confused with decreased safety awareness  and decreased awareness of deficits. Up in recliner. Sit to stand from recliner mod A and pt with heavy posterior lean such that toes were not even touching ground and pt could not correct. OT will continue to follow.     Vision     Perception     Praxis      Pertinent Vitals/Pain Pain Assessment: No/denies pain     Hand Dominance Right   Extremity/Trunk Assessment Upper Extremity Assessment Upper Extremity Assessment: Generalized weakness   Lower Extremity Assessment Lower Extremity Assessment: Defer to PT evaluation   Cervical / Trunk Assessment Cervical / Trunk Assessment: Normal   Communication Communication Communication: HOH   Cognition Arousal/Alertness: Awake/alert Behavior During Therapy: WFL for tasks assessed/performed Overall Cognitive Status: No family/caregiver present to determine baseline cognitive functioning       Memory: Decreased recall of precautions;Decreased short-term memory             General Comments       Exercises       Shoulder Instructions      Home Living Family/patient expects to be discharged to:: Private residence Living Arrangements: Spouse/significant other Available Help at Discharge: Family;Available PRN/intermittently   Home Access: Stairs to enter Entrance Stairs-Number of Steps: 3 Entrance Stairs-Rails: Right;Left Home Layout: Two level;Able to live on main level with bedroom/bathroom Alternate Level Stairs-Number of Steps: 13 Alternate Level Stairs-Rails: Left Bathroom Shower/Tub: Occupational psychologist: Standard Bathroom Accessibility:  Yes   Home Equipment: Walker - 2 wheels;Shower seat - built in;Bedside commode;Grab bars - tub/shower   Additional Comments: stays on first level      Prior Functioning/Environment Level of Independence: Independent        Comments: criminal lawyer, 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        OT Problem List: Decreased  strength;Decreased activity tolerance;Impaired balance (sitting and/or standing);Decreased cognition;Decreased safety awareness;Decreased knowledge of use of DME or AE;Decreased knowledge of precautions   OT Treatment/Interventions: Self-care/ADL training;DME and/or AE instruction;Therapeutic activities;Patient/family education;Therapeutic exercise    OT Goals(Current goals can be found in the care plan section) Acute Rehab OT Goals Patient Stated Goal: none stated OT Goal Formulation: With patient Time For Goal Achievement: 10/04/16 Potential to Achieve Goals: Good ADL Goals Pt Will Perform Upper Body Bathing: with set-up;sitting Pt Will Perform Lower Body Bathing: with min assist;sit to/from stand Pt Will Perform Upper Body Dressing: with set-up;sitting Pt Will Perform Lower Body Dressing: with min assist;sit to/from stand Pt Will Transfer to Toilet: with supervision;ambulating;bedside commode Pt Will Perform Toileting - Clothing Manipulation and hygiene: with supervision;sit to/from stand  OT Frequency: Min 2X/week   Barriers to D/C: Decreased caregiver support          Co-evaluation              End of Session Equipment Utilized During Treatment: Rolling walker Nurse Communication: Mobility status  Activity Tolerance: Patient tolerated treatment well Patient left: in chair;with call bell/phone within reach;with chair alarm set   Time: [id number] OT Time Calculation (min): 28 min Charges:  OT General Charges $OT Visit: 1 Procedure OT Evaluation $OT Eval Moderate Complexity: 1 Procedure OT Treatments $Self Care/Home Management : 8-22 mins G-Codes:    Joshua Henson A 09/24/2016, 2:07 PM

## 2016-09-20 NOTE — Progress Notes (Addendum)
PROGRESS NOTE    Joshua Henson  [id number] DOB: 07/08/1946 DOA: 09/13/2016 PCP: Mayra Neer, MD    Brief Narrative: 70 year old male with history of alcohol abuse, stroke, coronary artery disease, hypertension, depression, anxiety, liver cirrhosis, hepatic encephalopathy presented after a fall in the setting of alcohol use. Patient drinks alcohol everyday and now monitoring for withdrawal. Psychiatric consulted for evaluation of depression, started Remeron.   Assessment & Plan:   # Alcohol withdrawals and delirium tremens:  Gradual clinical improvement. Changed to oral ativan. Transferred from step down to tele floor. Still has minimal upper extremity tremor. -Continue CIWA protocol, vitamins and IV fluid.  -continue safety precaution.  -Ordered PT, OT evaluation for safe discharge planning.  #Depression: Evaluated by psychiatrist, started on Remeron. Denied low mood or suicidal ideation today.   # Essential hypertension: Elevated blood pressure likely in the setting of  alcohol withdrawal. Continue Coreg and added low dose hydralazine. Patient is also on hydralazine as needed for the management of high blood pressure.  #Hypokalemia, hypomagnesemia: Monitor electrolytes and replete as needed. Continue potassium chloride. Electrolytes are acceptable today.  # Hypophosphatemia: Phosphate level improved to 3.6 today. I will discontinue potassium phosphate. Encourage oral intake.  #Acute febrile illness: Exact etiology unknown. He does not have urinary symptoms and UA unremarkable. Chest x-ray with atelectasis versus pneumonia. He does have dry cough. I will start oral Levaquin. He is not hypoxic. Continue to monitor. -Check flu and respiratory viral studies.  #History liver cirrhosis: Continue supportive care. Ammonia level only mildly elevated on admission.    #Mild thrombocytopenia likely in the setting of liver cirrhosis: No sign of bleeding. Monitor blood counts.  DVT  prophylaxis: Heparin subcutaneous Code Status: Full code Family Communication: No family member present at bedside Disposition Plan: Likely discharge home in 1-2 days. She is febrile today, awaiting PT, OT evaluation. Consultants:   Psychiatry consult.    Subjective: Patient was seen and examined at bedside. He reports chronic hip pain. Able to move lower extremities. He feels less jittery today. Denies nausea, vomiting, chest pain, shortness of breath.  Objective: Vitals:   09/20/16 0056 09/20/16 0509 09/20/16 0824 09/20/16 1132  BP: (!) 156/91 (!) 156/86 (!) 162/83   Pulse: 66 78 91   Resp: 20 20 (!) 26   Temp: 98.7 F (37.1 C) 99.2 F (37.3 C) (!) 100.4 F (38 C) 98.7 F (37.1 C)  TempSrc: Oral Oral Oral Oral  SpO2: 97% 95% 96%   Weight:      Height:        Intake/Output Summary (Last 24 hours) at 09/20/16 1300 Last data filed at 09/20/16 1131  Gross per 24 hour  Intake             2560 ml  Output             1150 ml  Net             1410 ml   Filed Weights   09/13/16 1559 09/13/16 1832  Weight: 81.6 kg (180 lb) 83.3 kg (183 lb 10.3 oz)    Examination:  General exam: Appears much better and alert today. Not in distress. Respiratory system: Bibasal decreased breath sound, no wheezing appreciated Cardiovascular system: Regular rate and rhythm, S1-S2 normal, no lower extremity edema. Gastrointestinal system: Bowel sound positive, abdomen soft, nontender and nondistended Central nervous system: Has minimal upper extremity tremor. He is alert awake and following commands. Skin: A small bruises on the right knee, unchanged Psychiatry: depression.  Denied suicidal ideation today. Extremities: Able to move lower extremities without difficulty. No hip tenderness.   Data Reviewed: I have personally reviewed following labs and imaging studies  CBC:  Recent Labs Lab 09/13/16 1414 09/14/16 0401 09/18/16 0318  WBC 5.0 5.2 4.9  NEUTROABS 3.3  --   --   HGB 11.7*  11.5* 10.2*  HCT 32.7* 32.8* 28.3*  MCV 95.6 99.7 99.0  PLT 151 141* 123XX123   Basic Metabolic Panel:  Recent Labs Lab 09/15/16 0340  09/16/16 0346 09/17/16 0321 09/17/16 1025 09/18/16 0318 09/19/16 0310 09/20/16 0524  NA 135  < > 136 140  --  138 137 138  K 3.8  < > 3.5 3.9  --  3.7 3.5 3.6  CL 105  < > 107 112*  --  110 106 108  CO2 20*  < > 20* 19*  --  22 21* 23  GLUCOSE 96  < > 101* 109*  --  141* 105* 100*  BUN 10  < > 11 11  --  16 12 10   CREATININE 0.66  < > 0.69 0.76  --  0.68 0.67 0.73  CALCIUM 9.1  < > 8.9 9.2  --  9.3 8.8* 9.0  MG 1.7  --  1.5*  --  1.7  --  1.5* 2.1  PHOS 2.1*  --  2.3*  --  2.8  --  2.9 3.6  < > = values in this interval not displayed. GFR: Estimated Creatinine Clearance: 91.3 mL/min (by C-G formula based on SCr of 0.73 mg/dL). Liver Function Tests:  Recent Labs Lab 09/13/16 1414 09/14/16 0401 09/18/16 0318  AST 116* 93* 42*  ALT 66* 58 35  ALKPHOS 92 88 69  BILITOT 1.7* 1.4* 0.9  PROT 6.7 6.5 6.1*  ALBUMIN 3.9 3.8 2.8*   No results for input(s): LIPASE, AMYLASE in the last 168 hours.  Recent Labs Lab 09/13/16 1426  AMMONIA 41*   Coagulation Profile: No results for input(s): INR, PROTIME in the last 168 hours. Cardiac Enzymes: No results for input(s): CKTOTAL, CKMB, CKMBINDEX, TROPONINI in the last 168 hours. BNP (last 3 results) No results for input(s): PROBNP in the last 8760 hours. HbA1C: No results for input(s): HGBA1C in the last 72 hours. CBG: No results for input(s): GLUCAP in the last 168 hours. Lipid Profile: No results for input(s): CHOL, HDL, LDLCALC, TRIG, CHOLHDL, LDLDIRECT in the last 72 hours. Thyroid Function Tests: No results for input(s): TSH, T4TOTAL, FREET4, T3FREE, THYROIDAB in the last 72 hours. Anemia Panel: No results for input(s): VITAMINB12, FOLATE, FERRITIN, TIBC, IRON, RETICCTPCT in the last 72 hours. Sepsis Labs: No results for input(s): PROCALCITON, LATICACIDVEN in the last 168 hours.  Recent  Results (from the past 240 hour(s))  MRSA PCR Screening     Status: None   Collection Time: 09/13/16  6:06 PM  Result Value Ref Range Status   MRSA by PCR NEGATIVE NEGATIVE Final    Comment:        The GeneXpert MRSA Assay (FDA approved for NASAL specimens only), is one component of a comprehensive MRSA colonization surveillance program. It is not intended to diagnose MRSA infection nor to guide or monitor treatment for MRSA infections.          Radiology Studies: Dg Chest 1 View  Result Date: 09/20/2016 CLINICAL DATA:  History of fever, tachycardia, smoking history EXAM: CHEST 1 VIEW COMPARISON:  Chest x-ray of 05/09/2016 FINDINGS: Minimally prominent markings at the lung bases may well  represent linear atelectasis, with pneumonia difficult to exclude particularly at the left lung base. No effusion is seen. The heart is mildly enlarged and stable. No bony abnormality is seen. A calcification in the left neck is noted on the frontal view probably vascular in origin. IMPRESSION: Slightly prominent markings at the lung bases left-greater-than-right may represent atelectasis, but pneumonia cannot be excluded. Recommend followup. Electronically Signed   By: Ivar Drape M.D.   On: 09/20/2016 10:16        Scheduled Meds: . aspirin EC  81 mg Oral Daily  . carvedilol  12.5 mg Oral BID WC  . feeding supplement (ENSURE ENLIVE)  237 mL Oral BID BM  . folic acid  1 mg Oral Daily  . heparin  5,000 Units Subcutaneous Q8H  . hydrALAZINE  25 mg Oral Q8H  . levofloxacin  500 mg Oral Daily  . LORazepam  2 mg Oral Q8H  . mirtazapine  7.5 mg Oral QHS  . multivitamin with minerals  1 tablet Oral Daily  . potassium chloride SA  40 mEq Oral Daily  . potassium phosphate (monobasic)  500 mg Oral TID WC & HS  . sodium chloride flush  3 mL Intravenous Q12H  . thiamine  100 mg Oral Daily   Continuous Infusions: . sodium chloride 100 mL/hr at 09/20/16 0904     LOS: 7 days    Time spent: 23  minutes    Emerson Barretto Tanna Furry, MD [facility] Pager [phone]  If 7PM-7AM, please contact night-coverage [email or web address] Password [email or web address] 09/20/2016, 1:00 PM

## 2016-09-21 LAB — BASIC METABOLIC PANEL
ANION GAP: 5 (ref 5–15)
BUN: 16 mg/dL (ref 6–20)
CO2: 22 mmol/L (ref 22–32)
Calcium: 9.1 mg/dL (ref 8.9–10.3)
Chloride: 110 mmol/L (ref 101–111)
Creatinine, Ser: 0.68 mg/dL (ref 0.61–1.24)
Glucose, Bld: 107 mg/dL — ABNORMAL HIGH (ref 65–99)
POTASSIUM: 4.3 mmol/L (ref 3.5–5.1)
SODIUM: 137 mmol/L (ref 135–145)

## 2016-09-21 MED ORDER — OXYCODONE HCL 5 MG PO TABS
5.0000 mg | ORAL_TABLET | ORAL | Status: DC | PRN
  Administered 2016-09-21 – 2016-09-22 (×4): 5 mg via ORAL
  Filled 2016-09-21 (×5): qty 1

## 2016-09-21 NOTE — Care Management Important Message (Signed)
Important Message  Patient Details  Name: Joshua Henson MRN: [id number] Date of Birth: 02-16-1946   Medicare Important Message Given:  Yes    Camillo Flaming 09/21/2016, 10:34 AMImportant Message  Patient Details  Name: Joshua Henson MRN: [id number] Date of Birth: 1946/10/16   Medicare Important Message Given:  Yes    Camillo Flaming 09/21/2016, 10:34 AM

## 2016-09-21 NOTE — Progress Notes (Signed)
PROGRESS NOTE    Joshua Henson  [id number] DOB: 03/28/1946 DOA: 09/13/2016 PCP: Mayra Neer, MD    Brief Narrative: 70 year old male with history of alcohol abuse, stroke, coronary artery disease, hypertension, depression, anxiety, liver cirrhosis, hepatic encephalopathy presented after a fall in the setting of alcohol use. Patient drinks alcohol everyday and now monitoring for withdrawal. Psychiatric consulted for evaluation of depression, started on Remeron.   This AM he is resting comfortably, continues to be somewhat confused but not agitated, not tremulous. He denies pain, fevers, nausea but is fixated on his Meniere's and issues with his car/wife, etc.  Assessment & Plan: # Alcohol withdrawals and delirium tremens:  Gradual clinical improvement. Changed to oral ativan. Transferred from step down to tele floor. Has almost no upper extremity tremor. -Continue CIWA protocol, vitamins and IV fluid.  -continue safety precaution.  -Ordered PT, OT evaluation for safe discharge planning, recommend 24 hour supervision will likely need SNF  #Depression: Evaluated by psychiatrist, started on Remeron. Denies low mood or suicidal ideation.  # Essential hypertension: Elevated blood pressure likely in the setting of  alcohol withdrawal. Continue Coreg and added low dose hydralazine. Patient is also on hydralazine as needed for the management of high blood pressure.  #Hypokalemia, hypomagnesemia: Monitor electrolytes and replete as needed. Continue potassium chloride. Electrolytes are acceptable today.  # Hypophosphatemia: Phosphate level improved to 3.6 today. I will discontinue potassium phosphate. Encourage oral intake.  #Acute febrile illness: Exact etiology unknown. He does not have urinary symptoms and UA unremarkable. Chest x-ray with atelectasis versus pneumonia. He does have dry cough. Now on oral Levaquin. He is not hypoxic. Continue to monitor. -Check flu and respiratory viral  studies are negative. DC droplet precautions.  #History liver cirrhosis: Continue supportive care. Ammonia level only mildly elevated on admission, will recheck in AM since he doesn't look tremulous but is a bit confused. Not sure if that's his baseline though.    #Mild thrombocytopenia likely in the setting of liver cirrhosis: No sign of bleeding. Monitor blood counts.  DVT prophylaxis: Heparin subcutaneous Code Status: Full code Family Communication: No family member present at bedside. RN just updated fiance. Disposition Plan: Likely discharge 1-2 days, to SNF. Consultants:   Psychiatry consult.  Objective: Vitals:   09/20/16 1132 09/20/16 1419 09/20/16 2129 09/21/16 0521  BP:  138/76 (!) 158/94 (!) 164/85  Pulse:  96    Resp:  20 20 18   Temp: 98.7 F (37.1 C) 97.8 F (36.6 C) 98.5 F (36.9 C) 98.3 F (36.8 C)  TempSrc: Oral Oral Oral Oral  SpO2:  97% 97% 96%  Weight:      Height:        Intake/Output Summary (Last 24 hours) at 09/21/16 0931 Last data filed at 09/21/16 0800  Gross per 24 hour  Intake             2760 ml  Output             1550 ml  Net             1210 ml   Filed Weights   09/13/16 1559 09/13/16 1832  Weight: 81.6 kg (180 lb) 83.3 kg (183 lb 10.3 oz)    Examination: General exam: Appears alert today. Not in distress. Little confused, but oriented to self and location. Respiratory system: Bibasal decreased breath sound, no wheezing appreciated Cardiovascular system: Regular rate and rhythm, S1-S2 normal, no lower extremity edema. Gastrointestinal system: Bowel sound positive, abdomen soft, nontender and  nondistended Central nervous system: Has no upper extremity tremor. He is alert awake and following commands. Skin: A small bruises on the right knee, unchanged Extremities: Able to move lower extremities without difficulty. No hip tenderness.   Data Reviewed: I have personally reviewed following labs and imaging studies  CBC:  Recent  Labs Lab 09/18/16 0318  WBC 4.9  HGB 10.2*  HCT 28.3*  MCV 99.0  PLT 123XX123   Basic Metabolic Panel:  Recent Labs Lab 09/15/16 0340  09/16/16 0346 09/17/16 0321 09/17/16 1025 09/18/16 0318 09/19/16 0310 09/20/16 0524  NA 135  < > 136 140  --  138 137 138  K 3.8  < > 3.5 3.9  --  3.7 3.5 3.6  CL 105  < > 107 112*  --  110 106 108  CO2 20*  < > 20* 19*  --  22 21* 23  GLUCOSE 96  < > 101* 109*  --  141* 105* 100*  BUN 10  < > 11 11  --  16 12 10   CREATININE 0.66  < > 0.69 0.76  --  0.68 0.67 0.73  CALCIUM 9.1  < > 8.9 9.2  --  9.3 8.8* 9.0  MG 1.7  --  1.5*  --  1.7  --  1.5* 2.1  PHOS 2.1*  --  2.3*  --  2.8  --  2.9 3.6  < > = values in this interval not displayed. GFR: Estimated Creatinine Clearance: 91.3 mL/min (by C-G formula based on SCr of 0.73 mg/dL). Liver Function Tests:  Recent Labs Lab 09/18/16 0318  AST 42*  ALT 35  ALKPHOS 69  BILITOT 0.9  PROT 6.1*  ALBUMIN 2.8*   No results for input(s): LIPASE, AMYLASE in the last 168 hours. No results for input(s): AMMONIA in the last 168 hours. Coagulation Profile: No results for input(s): INR, PROTIME in the last 168 hours. Cardiac Enzymes: No results for input(s): CKTOTAL, CKMB, CKMBINDEX, TROPONINI in the last 168 hours. BNP (last 3 results) No results for input(s): PROBNP in the last 8760 hours. HbA1C: No results for input(s): HGBA1C in the last 72 hours. CBG: No results for input(s): GLUCAP in the last 168 hours. Lipid Profile: No results for input(s): CHOL, HDL, LDLCALC, TRIG, CHOLHDL, LDLDIRECT in the last 72 hours. Thyroid Function Tests: No results for input(s): TSH, T4TOTAL, FREET4, T3FREE, THYROIDAB in the last 72 hours. Anemia Panel: No results for input(s): VITAMINB12, FOLATE, FERRITIN, TIBC, IRON, RETICCTPCT in the last 72 hours. Sepsis Labs: No results for input(s): PROCALCITON, LATICACIDVEN in the last 168 hours.  Recent Results (from the past 240 hour(s))  MRSA PCR Screening     Status:  None   Collection Time: 09/13/16  6:06 PM  Result Value Ref Range Status   MRSA by PCR NEGATIVE NEGATIVE Final    Comment:        The GeneXpert MRSA Assay (FDA approved for NASAL specimens only), is one component of a comprehensive MRSA colonization surveillance program. It is not intended to diagnose MRSA infection nor to guide or monitor treatment for MRSA infections.   Respiratory Panel by PCR     Status: None   Collection Time: 09/20/16  1:14 PM  Result Value Ref Range Status   Adenovirus NOT DETECTED NOT DETECTED Final   Coronavirus 229E NOT DETECTED NOT DETECTED Final   Coronavirus HKU1 NOT DETECTED NOT DETECTED Final   Coronavirus NL63 NOT DETECTED NOT DETECTED Final   Coronavirus OC43 NOT DETECTED  NOT DETECTED Final   Metapneumovirus NOT DETECTED NOT DETECTED Final   Rhinovirus / Enterovirus NOT DETECTED NOT DETECTED Final   Influenza A NOT DETECTED NOT DETECTED Final   Influenza B NOT DETECTED NOT DETECTED Final   Parainfluenza Virus 1 NOT DETECTED NOT DETECTED Final   Parainfluenza Virus 2 NOT DETECTED NOT DETECTED Final   Parainfluenza Virus 3 NOT DETECTED NOT DETECTED Final   Parainfluenza Virus 4 NOT DETECTED NOT DETECTED Final   Respiratory Syncytial Virus NOT DETECTED NOT DETECTED Final   Bordetella pertussis NOT DETECTED NOT DETECTED Final   Chlamydophila pneumoniae NOT DETECTED NOT DETECTED Final   Mycoplasma pneumoniae NOT DETECTED NOT DETECTED Final    Comment: Performed at [facility]         Radiology Studies: Dg Chest 1 View  Result Date: 09/20/2016 CLINICAL DATA:  History of fever, tachycardia, smoking history EXAM: CHEST 1 VIEW COMPARISON:  Chest x-ray of 05/09/2016 FINDINGS: Minimally prominent markings at the lung bases may well represent linear atelectasis, with pneumonia difficult to exclude particularly at the left lung base. No effusion is seen. The heart is mildly enlarged and stable. No bony abnormality is seen. A calcification in  the left neck is noted on the frontal view probably vascular in origin. IMPRESSION: Slightly prominent markings at the lung bases left-greater-than-right may represent atelectasis, but pneumonia cannot be excluded. Recommend followup. Electronically Signed   By: Ivar Drape M.D.   On: 09/20/2016 10:16        Scheduled Meds: . aspirin EC  81 mg Oral Daily  . carvedilol  12.5 mg Oral BID WC  . feeding supplement (ENSURE ENLIVE)  237 mL Oral BID BM  . folic acid  1 mg Oral Daily  . heparin  5,000 Units Subcutaneous Q8H  . hydrALAZINE  25 mg Oral Q8H  . levofloxacin  500 mg Oral Daily  . LORazepam  2 mg Oral Q8H  . mirtazapine  7.5 mg Oral QHS  . multivitamin with minerals  1 tablet Oral Daily  . potassium chloride SA  40 mEq Oral Daily  . sodium chloride flush  3 mL Intravenous Q12H  . thiamine  100 mg Oral Daily   Continuous Infusions: . sodium chloride 100 mL/hr at 09/21/16 0340    LOS: 8 days   Time spent: 20 minutes  Asiel Chrostowski Marry Guan, MD [facility] Pager [phone]  If 7PM-7AM, please contact night-coverage [email or web address] Password [email or web address] 09/21/2016, 9:31 AM

## 2016-09-22 LAB — BASIC METABOLIC PANEL
ANION GAP: 6 (ref 5–15)
BUN: 18 mg/dL (ref 6–20)
CALCIUM: 9.5 mg/dL (ref 8.9–10.3)
CO2: 23 mmol/L (ref 22–32)
Chloride: 109 mmol/L (ref 101–111)
Creatinine, Ser: 0.8 mg/dL (ref 0.61–1.24)
Glucose, Bld: 108 mg/dL — ABNORMAL HIGH (ref 65–99)
Potassium: 4 mmol/L (ref 3.5–5.1)
SODIUM: 138 mmol/L (ref 135–145)

## 2016-09-22 LAB — CBC
HCT: 30.1 % — ABNORMAL LOW (ref 39.0–52.0)
HEMOGLOBIN: 10.2 g/dL — AB (ref 13.0–17.0)
MCH: 34.8 pg — ABNORMAL HIGH (ref 26.0–34.0)
MCHC: 33.9 g/dL (ref 30.0–36.0)
MCV: 102.7 fL — ABNORMAL HIGH (ref 78.0–100.0)
PLATELETS: 326 10*3/uL (ref 150–400)
RBC: 2.93 MIL/uL — AB (ref 4.22–5.81)
RDW: 16.4 % — ABNORMAL HIGH (ref 11.5–15.5)
WBC: 4.4 10*3/uL (ref 4.0–10.5)

## 2016-09-22 LAB — AMMONIA: AMMONIA: 20 umol/L (ref 9–35)

## 2016-09-22 MED ORDER — LORAZEPAM 1 MG PO TABS: 1.0000 mg | ORAL_TABLET | Freq: Three times a day (TID) | ORAL | Status: DC

## 2016-09-22 MED ORDER — LORAZEPAM 1 MG PO TABS
1.0000 mg | ORAL_TABLET | Freq: Two times a day (BID) | ORAL | Status: AC
  Administered 2016-09-22 – 2016-09-23 (×2): 1 mg via ORAL
  Filled 2016-09-22 (×2): qty 1

## 2016-09-22 NOTE — Progress Notes (Signed)
Physical Therapy Treatment Patient Details Name: Joshua Henson MRN: [id number] DOB: July 24, 1946 Today's Date: 09/22/2016    History of Present Illness 70 y.o. male admitted with fall, EtOH withdrawal. PMH of prostate cancer, CVA, depression, HTN, alcoholic cirrhosis, hepatic encephalopathy    PT Comments    The  Patient was more cooperative and able to participate than when OT attempted. Patient relates that he does not want to go to [place]. Continue PT while in acute care.  Follow Up Recommendations  Home health PT;SNF;Supervision/Assistance - 24 hour     Equipment Recommendations  None recommended by PT    Recommendations for Other Services       Precautions / Restrictions Precautions Precautions: Fall Precaution Comments: fell PTA per chart, pt denies this    Mobility  Bed Mobility Overal bed mobility: Needs Assistance Bed Mobility: Supine to Sit     Supine to sit: Supervision        Transfers Overall transfer level: Needs assistance Equipment used: Rolling walker (2 wheeled) Transfers: Sit to/from Stand Sit to Stand: Min assist         General transfer comment: steady assist.  Ambulation/Gait Ambulation/Gait assistance: Min guard;Min assist Ambulation Distance (Feet): 200 Feet Assistive device: Rolling walker (2 wheeled) Gait Pattern/deviations: Step-through pattern;Shuffle Gait velocity: decr   General Gait Details: VCs to increase step length and for positioning in RW, no LOB   Stairs            Wheelchair Mobility    Modified Rankin (Stroke Patients Only)       Balance     Sitting balance-Leahy Scale: Good     Standing balance support: During functional activity;Bilateral upper extremity supported Standing balance-Leahy Scale: Fair                      Cognition Arousal/Alertness: Awake/alert Behavior During Therapy: Flat affect Overall Cognitive Status: No family/caregiver present to determine baseline cognitive  functioning Area of Impairment: Safety/judgement;Awareness     Memory: Decreased recall of precautions;Decreased short-term memory   Safety/Judgement: Decreased awareness of deficits;Decreased awareness of safety     General Comments: patient states that [facility] is not an option vbecause he doesn't do much for therapy there.    Exercises      General Comments        Pertinent Vitals/Pain Pain Assessment: No/denies pain    Home Living                      Prior Function            PT Goals (current goals can now be found in the care plan section) Progress towards PT goals: Progressing toward goals    Frequency    Min 3X/week      PT Plan Current plan remains appropriate    Co-evaluation             End of Session Equipment Utilized During Treatment: Gait belt Activity Tolerance: Patient tolerated treatment well Patient left: in chair;with call bell/phone within reach;with chair alarm set     Time: [id number] PT Time Calculation (min) (ACUTE ONLY): 19 min  Charges:  $Gait Training: 8-22 mins                    G Codes:      Claretha Cooper 09/22/2016, 4:25 PM Tresa Endo PT [phone]

## 2016-09-22 NOTE — NC FL2 (Signed)
[place] LEVEL OF CARE SCREENING TOOL     IDENTIFICATION  Patient Name: Joshua Henson Birthdate: 1946/01/23 Sex: male Admission Date (Current Location): 09/13/2016  [facility] and [place] Number:  Herbalist and Address:  [facility],  Koci Webster [address] [place]      Provider Number: [id number]  Attending Physician Name and Address:  Patrecia Pour, MD  Relative Name and Phone Number:       Current Level of Care: Hospital Recommended Level of Care: [place] Prior Approval Number:    Date Approved/Denied:   PASRR Number: [id number]  Discharge Plan: SNF    Current Diagnoses: Patient Active Problem List   Diagnosis Date Noted  . Fever   . Alcohol withdrawal delirium ([place]) 09/13/2016  . Left upper quadrant pain 05/10/2016  . Encephalopathy 05/09/2016  . C. difficile colitis 03/30/2016  . Acute kidney injury ([place]) 03/30/2016  . Essential hypertension 03/23/2016  . Fall   . Tobacco abuse   . Hypokalemia   . Hypomagnesemia   . Hypophosphatemia   . CAP (community acquired pneumonia) due to MSSA (methicillin sensitive Staphylococcus aureus) ([place])   . CAD (coronary artery disease)   . Cocaine abuse   . Alcohol abuse with intoxication ([place]) 01/11/2016  . Frequent falls 01/11/2016  . Left rib fracture 01/11/2016  . Acute encephalopathy 01/11/2016  . Polysubstance abuse 01/11/2016  . Delirium tremens ([place]) 01/11/2016  . NSTEMI (non-ST elevated myocardial infarction) ([place]) 01/11/2016  . Sepsis due to Enterococcus with acute renal failure and metabolic encephalopathy ([place]) 01/11/2016  . Alcohol intoxication ([place])   . Chronic alcohol abuse   . Acute respiratory failure with hypoxia ([place])   . Elevated troponin 01/10/2016  . Chest pain 01/10/2016  . CAD (coronary artery disease), native coronary artery 01/10/2016  . Fatty liver, alcoholic [id number]  . Tremor 04/02/2015  . Ataxia   . Alcohol withdrawal, with  delirium ([place]) 09/03/2014  . Unspecified cerebral artery occlusion with cerebral infarction 07/05/2014  . Alcohol abuse 07/02/2014  . Current tobacco use 01/17/2014  . Hearing loss 11/25/2013  . Tinnitus of both ears 11/25/2013    Orientation RESPIRATION BLADDER Height & Weight     Self, Time  Normal Continent Weight: 183 lb 10.3 oz (83.3 kg) Height:  5\' 10"  (177.8 cm)  BEHAVIORAL SYMPTOMS/MOOD NEUROLOGICAL BOWEL NUTRITION STATUS      Incontinent Diet (Heart)  AMBULATORY STATUS COMMUNICATION OF NEEDS Skin   Extensive Assist Verbally  (Wound / Incision (Open or Dehisced) 09/13/16 Other (Comment) Other (Comment) Right;Left;Lower;Upper ABRASIONS/HEALING NO S/S INFECTION & Wound / Incision (Open or Dehisced) 09/13/16 Other (Comment) Finger (Comment which one) Right 5TH FINGER BROKEN/ RT H)                       Personal Care Assistance Level of Assistance  Bathing, Feeding, Dressing Bathing Assistance: Limited assistance Feeding assistance: Limited assistance Dressing Assistance: Limited assistance     Functional Limitations Info  Sight, Hearing, Speech Sight Info: Adequate Hearing Info: Impaired Speech Info: Adequate    SPECIAL CARE FACTORS FREQUENCY  PT (By licensed PT), OT (By licensed OT)     PT Frequency: 5 OT Frequency: 5            Contractures      Additional Factors Info  Code Status, Allergies, Psychotropic Code Status Info: Fullcode Allergies Info:  Lisinopril, Celebrex Celecoxib  Current Medications (09/22/2016):  This is the current hospital active medication list Current Facility-Administered Medications  Medication Dose Route Frequency Provider Last Rate Last Dose  . 0.9 %  sodium chloride infusion   Intravenous Continuous Mariel Aloe, MD 100 mL/hr at 09/21/16 2158    . aspirin EC tablet 81 mg  81 mg Oral Daily Mariel Aloe, MD   81 mg at 09/22/16 0817  . carvedilol (COREG) tablet 12.5 mg  12.5 mg Oral BID WC Dron Tanna Furry, MD   12.5 mg at 09/22/16 0816  . feeding supplement (ENSURE ENLIVE) (ENSURE ENLIVE) liquid 237 mL  237 mL Oral BID BM Mariel Aloe, MD   237 mL at 09/22/16 1000  . folic acid (FOLVITE) tablet 1 mg  1 mg Oral Daily Dron Tanna Furry, MD   1 mg at 09/22/16 0817  . haloperidol lactate (HALDOL) injection 5 mg  5 mg Intravenous Q6H PRN Oswald Hillock, MD   5 mg at 09/22/16 0315  . heparin injection 5,000 Units  5,000 Units Subcutaneous Q8H Mariel Aloe, MD   5,000 Units at 09/22/16 0555  . hydrALAZINE (APRESOLINE) tablet 25 mg  25 mg Oral Q8H Dron Tanna Furry, MD   25 mg at 09/22/16 0555  . levofloxacin (LEVAQUIN) tablet 500 mg  500 mg Oral Daily Dron Tanna Furry, MD   500 mg at 09/22/16 0817  . LORazepam (ATIVAN) injection 2-3 mg  2-3 mg Intravenous Q1H PRN Mariel Aloe, MD   3 mg at 09/20/16 2333  . LORazepam (ATIVAN) tablet 1 mg  1 mg Oral BID Patrecia Pour, MD      . mirtazapine (REMERON) tablet 7.5 mg  7.5 mg Oral QHS Dron Tanna Furry, MD   7.5 mg at 09/21/16 2159  . morphine 2 MG/ML injection 2 mg  2 mg Intravenous Q4H PRN Mariel Aloe, MD   2 mg at 09/21/16 0541  . multivitamin with minerals tablet 1 tablet  1 tablet Oral Daily Mariel Aloe, MD   1 tablet at 09/22/16 [phone]  . ondansetron (ZOFRAN) injection 4 mg  4 mg Intravenous Q8H PRN Mariel Aloe, MD      . oxyCODONE (Oxy IR/ROXICODONE) immediate release tablet 5 mg  5 mg Oral Q4H PRN Mir Marry Guan, MD   5 mg at 09/22/16 0126  . potassium chloride SA (K-DUR,KLOR-CON) CR tablet 40 mEq  40 mEq Oral Daily Dron Tanna Furry, MD   40 mEq at 09/22/16 0817  . sodium chloride flush (NS) 0.9 % injection 3 mL  3 mL Intravenous Q12H Mariel Aloe, MD   3 mL at 09/19/16 0935  . thiamine (VITAMIN B-1) tablet 100 mg  100 mg Oral Daily Dron Tanna Furry, MD   100 mg at 09/22/16 [id number]     Discharge Medications: Please see discharge summary for a list of discharge medications.  Relevant Imaging  Results:  Relevant Lab Results:   Additional Information SSN: [id number]  Standley Brooking, LCSW

## 2016-09-22 NOTE — Progress Notes (Signed)
PROGRESS NOTE  Joshua Henson  [id number] DOB: [birth year] DOA: 09/13/2016 PCP: Mayra Neer, MD   Brief Narrative: Joshua Henson is a 69 year old male with history of alcohol abuse and hepatic cirrhosis, stroke, coronary artery disease, hypertension, depression, and anxiety who presented after a fall in the setting of alcohol use. Patient drinks 5 drinks or more of vodka daily, so he was admitted for monitoring of withdrawal. CIWA protocol was used and requirement for ativan slowly decreased. Psychiatry was consulted for evaluation of depression, started on Remeron. Intermittent confusion has been reported without tremors. Ammonia is normal. He has developed a cough and low-grade fever prompting CXR which showed atelectasis vs. LLL pneumonia on 10/3, so antibiotics were begun.   Assessment & Plan: Acute alcohol withdrawal: Improving gradually, has been transferred to telemetry on oral ativan with decreasing CIWA scores. Last drink was day PTA (9/25):  - CIWA 7, 6, 4, 3 overnight.  - Continue CIWA protocol, vitamins and IV fluid.  - Continue safety precautions.  - Decrease dose and frequency of oral ativan: 1mg  po BID x 2 doses. Reassess in AM. If stable, he is not far enough out from first drink to safely discharge in relation to this problem.  Deconditioning: Related to recurrent C diff infections, bedbound status, and disuse due to alcoholism.  - PT, OT working with him while inpatient - I believe he requires SNF for acute rehabilitation to give him ths best chance at regaining function. Doubt he would do well even with his girlfriend at home and home healthy given they will not be available 24/7.   Pneumonia: Difficult to determine chronology/etiology. Presume healthcare-associated given this was not present on arrival. Could also be aspiration related to intoxication. No urinary symptoms or UA findings.  - Continue levaquin (10/3 >> ) - Oxygen prn and monitor pulse ox, has not been  hypoxemic.  - Flu and respiratory viral studies are negative. - Incentive spirometry.  Depression: Evaluated by psychiatrist, started on Remeron.  - No SI/HI  Essential hypertension: Elevated blood pressure likely in the setting of  alcohol withdrawal. Continue Coreg and added low dose hydralazine. Patient is also on hydralazine as needed for the management of high blood pressure.  Hypokalemia, hypomagnesemia, hypophosphatemia: Resolved - Monitor prn - Encourage oral intake.  Hepatic cirrhosis: With concomitant thrombocytopenia at baseline, and macrocytic anemia at baseline.  - Continue supportive care and intermittent blood work. - Avoid hepatotoxins.  DVT prophylaxis: Heparin subcutaneous Code Status: Full code Family Communication: No family member present at bedside. Declined offer to discuss with girlfriend. Disposition Plan: Likely discharge 24-48 hours, to SNF. Reiterated importance of SNF as best chance for long term improvement.   Consultants:   Psychiatry  Objective: Vitals:   09/21/16 0521 09/21/16 1250 09/21/16 2145 09/22/16 0507  BP: (!) 164/85 (!) 155/81 (!) 146/74 (!) 148/83  Pulse:  81 84 86  Resp: 18 20 20 20   Temp: 98.3 F (36.8 C) 98.7 F (37.1 C) 98.5 F (36.9 C) 98.2 F (36.8 C)  TempSrc: Oral Oral Oral Oral  SpO2: 96% 98% 95% 96%  Weight:      Height:        Intake/Output Summary (Last 24 hours) at 09/22/16 1234 Last data filed at 09/22/16 1003  Gross per 24 hour  Intake          2766.67 ml  Output             2775 ml  Net            -  8.33 ml   Filed Weights   09/13/16 1559 09/13/16 1832  Weight: 81.6 kg (180 lb) 83.3 kg (183 lb 10.3 oz)    Examination: General exam: 70 y.o. male appearing older than stated age sleeping soundly. No distress.  Respiratory system: Decreased breath sounds throughout without focal wheezing or crackles. Nonlabored.  Cardiovascular system: Regular rate and rhythm, S1-S2 normal, no lower extremity  edema. Gastrointestinal system: Bowel sound positive, abdomen soft, nontender and nondistended Central nervous system: Normal speech, diffusely weak without focal sensorimotor impairment.  Psych: Alert, oriented, talkative without pressured speech. Skin: No petechiae or telangiectasias Extremities: 5/5 strength without deformities.    Data Reviewed: I have personally reviewed following labs and imaging studies  CBC:  Recent Labs Lab 09/18/16 0318 09/22/16 0459  WBC 4.9 4.4  HGB 10.2* 10.2*  HCT 28.3* 30.1*  MCV 99.0 102.7*  PLT 151 [id number]   Basic Metabolic Panel:  Recent Labs Lab 09/16/16 0346  09/17/16 1025 09/18/16 0318 09/19/16 0310 09/20/16 0524 09/21/16 1342 09/22/16 0459  NA 136  < >  --  138 137 138 137 138  K 3.5  < >  --  3.7 3.5 3.6 4.3 4.0  CL 107  < >  --  110 106 108 110 109  CO2 20*  < >  --  22 21* 23 22 23   GLUCOSE 101*  < >  --  141* 105* 100* 107* 108*  BUN 11  < >  --  16 12 10 16 18   CREATININE 0.69  < >  --  0.68 0.67 0.73 0.68 0.80  CALCIUM 8.9  < >  --  9.3 8.8* 9.0 9.1 9.5  MG 1.5*  --  1.7  --  1.5* 2.1  --   --   PHOS 2.3*  --  2.8  --  2.9 3.6  --   --   < > = values in this interval not displayed. GFR: Estimated Creatinine Clearance: 91.3 mL/min (by C-G formula based on SCr of 0.8 mg/dL). Liver Function Tests:  Recent Labs Lab 09/18/16 0318  AST 42*  ALT 35  ALKPHOS 69  BILITOT 0.9  PROT 6.1*  ALBUMIN 2.8*   No results for input(s): LIPASE, AMYLASE in the last 168 hours.  Recent Labs Lab 09/22/16 0459  AMMONIA 20   Coagulation Profile: No results for input(s): INR, PROTIME in the last 168 hours. Cardiac Enzymes: No results for input(s): CKTOTAL, CKMB, CKMBINDEX, TROPONINI in the last 168 hours. BNP (last 3 results) No results for input(s): PROBNP in the last 8760 hours. HbA1C: No results for input(s): HGBA1C in the last 72 hours. CBG: No results for input(s): GLUCAP in the last 168 hours. Lipid Profile: No results  for input(s): CHOL, HDL, LDLCALC, TRIG, CHOLHDL, LDLDIRECT in the last 72 hours. Thyroid Function Tests: No results for input(s): TSH, T4TOTAL, FREET4, T3FREE, THYROIDAB in the last 72 hours. Anemia Panel: No results for input(s): VITAMINB12, FOLATE, FERRITIN, TIBC, IRON, RETICCTPCT in the last 72 hours. Sepsis Labs: No results for input(s): PROCALCITON, LATICACIDVEN in the last 168 hours.  Recent Results (from the past 240 hour(s))  MRSA PCR Screening     Status: None   Collection Time: 09/13/16  6:06 PM  Result Value Ref Range Status   MRSA by PCR NEGATIVE NEGATIVE Final    Comment:        The GeneXpert MRSA Assay (FDA approved for NASAL specimens only), is one component of a comprehensive MRSA colonization surveillance program.  It is not intended to diagnose MRSA infection nor to guide or monitor treatment for MRSA infections.   Respiratory Panel by PCR     Status: None   Collection Time: 09/20/16  1:14 PM  Result Value Ref Range Status   Adenovirus NOT DETECTED NOT DETECTED Final   Coronavirus 229E NOT DETECTED NOT DETECTED Final   Coronavirus HKU1 NOT DETECTED NOT DETECTED Final   Coronavirus NL63 NOT DETECTED NOT DETECTED Final   Coronavirus OC43 NOT DETECTED NOT DETECTED Final   Metapneumovirus NOT DETECTED NOT DETECTED Final   Rhinovirus / Enterovirus NOT DETECTED NOT DETECTED Final   Influenza A NOT DETECTED NOT DETECTED Final   Influenza B NOT DETECTED NOT DETECTED Final   Parainfluenza Virus 1 NOT DETECTED NOT DETECTED Final   Parainfluenza Virus 2 NOT DETECTED NOT DETECTED Final   Parainfluenza Virus 3 NOT DETECTED NOT DETECTED Final   Parainfluenza Virus 4 NOT DETECTED NOT DETECTED Final   Respiratory Syncytial Virus NOT DETECTED NOT DETECTED Final   Bordetella pertussis NOT DETECTED NOT DETECTED Final   Chlamydophila pneumoniae NOT DETECTED NOT DETECTED Final   Mycoplasma pneumoniae NOT DETECTED NOT DETECTED Final    Comment: Performed at [facility]          Radiology Studies: No results found.      Scheduled Meds: . aspirin EC  81 mg Oral Daily  . carvedilol  12.5 mg Oral BID WC  . feeding supplement (ENSURE ENLIVE)  237 mL Oral BID BM  . folic acid  1 mg Oral Daily  . heparin  5,000 Units Subcutaneous Q8H  . hydrALAZINE  25 mg Oral Q8H  . levofloxacin  500 mg Oral Daily  . LORazepam  1 mg Oral BID  . mirtazapine  7.5 mg Oral QHS  . multivitamin with minerals  1 tablet Oral Daily  . potassium chloride SA  40 mEq Oral Daily  . sodium chloride flush  3 mL Intravenous Q12H  . thiamine  100 mg Oral Daily   Continuous Infusions: . sodium chloride 100 mL/hr at 09/21/16 2158    LOS: 9 days   Time spent: 20 minutes  Vance Gather, MD [facility] Pager [phone]   If 7PM-7AM, please contact night-coverage [email or web address] Password [email or web address] 09/22/2016, 12:34 PM

## 2016-09-22 NOTE — Consult Note (Signed)
   [facility] Inpatient Consult   09/22/2016  Leonel Wakley [facility] [birth year] [id number]    Patient screened for potential [facility] Care Management services. Chart reviewed. Noted current discharge plan is for SNF. Spoke with inpatient RNCM to confirm. There are no identifiable [facility] Care Management needs at this time. If patient's post hospital needs change, please place a [facility] Care Management consult. For questions please contact:  Marthenia Rolling, [place], RN,BSN [facility] Liaison [phone]

## 2016-09-22 NOTE — Progress Notes (Signed)
Occupational Therapy Treatment Patient Details Name: ESSIAH PASSARIELLO MRN: [id number] DOB: [birth year] Today's Date: 09/22/2016    History of present illness 70 y.o. male admitted with fall, EtOH withdrawal. PMH of prostate cancer, CVA, depression, HTN, alcoholic cirrhosis, hepatic encephalopathy   OT comments  Patient highly confused this date, thinks it is "January 2017," perseverating on when he will be discharged and where his car is. Does not recall being in hospital x 9 days or why he is here. Patient does report that his girlfriend works. He does not have 24/7 supervision/assistance at home; therefore, d/c recs updated to SNF. OT will continue to follow.  Follow Up Recommendations  SNF;Supervision/Assistance - 24 hour    Equipment Recommendations  3 in 1 bedside comode    Recommendations for Other Services      Precautions / Restrictions Precautions Precautions: Fall Precaution Comments: fell PTA per chart, pt denies this Restrictions Weight Bearing Restrictions: No       Mobility Bed Mobility Overal bed mobility: Needs Assistance Bed Mobility: Supine to Sit;Sit to Supine     Supine to sit: Min guard Sit to supine: Min guard      Transfers                 General transfer comment: pt unable to be directed to perform transfers    Balance Overall balance assessment: History of Falls                                 ADL Overall ADL's : Needs assistance/impaired Eating/Feeding: Set up;Sitting   Grooming: Wash/dry hands;Wash/dry face;Set up;Supervision/safety;Sitting                 Lower Body Dressing Details (indicate cue type and reason): adjusted socks with min A (crosses each foot over opposite knee)               General ADL Comments: Patient confused this morning, thinks it is "January 2017," perseverating over where his car is. States he has seen a doctor for "only 13 seconds" since he's been hospitalized, does not recall he  has been hospitalized x 9 days. Supine <> sit min guard A but pt so perseverative on being d/c home and where his car is that he could not be directed to perform further ADL/mobility tasks. Returned to bed with bed alarm replaced.      Vision                     Perception     Praxis      Cognition   Behavior During Therapy: Anxious Overall Cognitive Status: No family/caregiver present to determine baseline cognitive functioning       Memory: Decreased recall of precautions;Decreased short-term memory               Extremity/Trunk Assessment               Exercises     Shoulder Instructions       General Comments      Pertinent Vitals/ Pain       Pain Assessment: No/denies pain  Home Living                                          Prior Functioning/Environment  Frequency  Min 2X/week        Progress Toward Goals  OT Goals(current goals can now be found in the care plan section)  Progress towards OT goals: Not progressing toward goals - comment (limited session today)  Acute Rehab OT Goals Patient Stated Goal: none stated  Plan Discharge plan needs to be updated    Co-evaluation                 End of Session     Activity Tolerance Other (comment) (tx limited by perseveration on d/c, where his car is)   Patient Left in bed;with call bell/phone within reach;with bed alarm set   Nurse Communication          Time: [phone] OT Time Calculation (min): 12 min  Charges: OT General Charges $OT Visit: 1 Procedure OT Treatments $Self Care/Home Management : 8-22 mins  Marixa Mellott A 09/22/2016, 8:07 AM

## 2016-09-22 NOTE — Clinical Social Work Placement (Signed)
CSW reviewed PT evaluation recommending [facility] vs SNF. CSW spoke with patient's girlfriend, Joshua Henson (cell#: [phone]) re: discharge planning as patient only A&Ox2 currently. Patient had been to [facility] back in May and patient's girlfriend is requesting that he go back there at discharge. CSW awaiting call back from [place] at [place] re: bed offer.    Raynaldo Opitz, [facility] Clinical Social Worker cell #: [phone]    CLINICAL SOCIAL WORK PLACEMENT  NOTE  Date:  09/22/2016  Patient Details  Name: Joshua Henson MRN: [id number] Date of Birth: 1946/07/20  Clinical Social Work is seeking post-discharge placement for this patient at the [place] level of care (*CSW will initial, date and re-position this form in  chart as items are completed):  Yes   Patient/family provided with [place] Work Department's list of facilities offering this level of care within the geographic area requested by the patient (or if unable, by the patient's family).  Yes   Patient/family informed of their freedom to choose among providers that offer the needed level of care, that participate in Medicare, Medicaid or managed care program needed by the patient, have an available bed and are willing to accept the patient.  Yes   Patient/family informed of [facility]'s ownership interest in [facility] and [facility], as well as of the fact that they are under no obligation to receive care at these facilities.  PASRR submitted to EDS on       PASRR number received on       Existing PASRR number confirmed on 09/22/16     FL2 transmitted to all facilities in geographic area requested by pt/family on 09/22/16     FL2 transmitted to all facilities within larger geographic area on       Patient informed that his/her managed care company has contracts with or will negotiate with certain facilities, including the following:         Patient/family informed of bed offers received.  Patient chooses bed at       Physician recommends and patient chooses bed at      Patient to be transferred to   on  .  Patient to be transferred to facility by       Patient family notified on   of transfer.  Name of family member notified:        PHYSICIAN       Additional Comment:    _______________________________________________ Standley Brooking, LCSW 09/22/2016, 11:54 AM

## 2016-09-22 NOTE — Care Management Note (Signed)
Case Management Note  Patient Details  Name: Joshua Henson MRN: [id number] Date of Birth: [birth year]  Subjective/Objective:  Noted CSW following for SNF.                  Action/Plan:d/c plan SNF.   Expected Discharge Date:   (unknown)               Expected Discharge Plan:  Skilled Nursing Facility  In-House Referral:  Clinical Social Work  Discharge planning Services  CM Consult  Post Acute Care Choice:    Choice offered to:     DME Arranged:    DME Agency:     HH Arranged:    [place] Agency:     Status of Service:  In process, will continue to follow  If discussed at Long Length of Stay Meetings, dates discussed:    Additional Comments:  Dessa Phi, RN 09/22/2016, 1:44 PM

## 2016-09-23 DIAGNOSIS — M6281 Muscle weakness (generalized): Secondary | ICD-10-CM | POA: Diagnosis not present

## 2016-09-23 DIAGNOSIS — R296 Repeated falls: Secondary | ICD-10-CM | POA: Diagnosis not present

## 2016-09-23 DIAGNOSIS — I10 Essential (primary) hypertension: Secondary | ICD-10-CM | POA: Diagnosis not present

## 2016-09-23 DIAGNOSIS — R278 Other lack of coordination: Secondary | ICD-10-CM | POA: Diagnosis not present

## 2016-09-23 DIAGNOSIS — F10231 Alcohol dependence with withdrawal delirium: Secondary | ICD-10-CM | POA: Diagnosis not present

## 2016-09-23 DIAGNOSIS — R2681 Unsteadiness on feet: Secondary | ICD-10-CM | POA: Diagnosis not present

## 2016-09-23 DIAGNOSIS — F329 Major depressive disorder, single episode, unspecified: Secondary | ICD-10-CM | POA: Diagnosis not present

## 2016-09-23 DIAGNOSIS — F419 Anxiety disorder, unspecified: Secondary | ICD-10-CM | POA: Diagnosis not present

## 2016-09-23 DIAGNOSIS — E43 Unspecified severe protein-calorie malnutrition: Secondary | ICD-10-CM | POA: Diagnosis not present

## 2016-09-23 DIAGNOSIS — N3000 Acute cystitis without hematuria: Secondary | ICD-10-CM | POA: Diagnosis not present

## 2016-09-23 DIAGNOSIS — E785 Hyperlipidemia, unspecified: Secondary | ICD-10-CM | POA: Diagnosis not present

## 2016-09-23 DIAGNOSIS — D638 Anemia in other chronic diseases classified elsewhere: Secondary | ICD-10-CM | POA: Diagnosis not present

## 2016-09-23 DIAGNOSIS — K7460 Unspecified cirrhosis of liver: Secondary | ICD-10-CM | POA: Diagnosis not present

## 2016-09-23 DIAGNOSIS — R5381 Other malaise: Secondary | ICD-10-CM | POA: Diagnosis not present

## 2016-09-23 DIAGNOSIS — R2689 Other abnormalities of gait and mobility: Secondary | ICD-10-CM | POA: Diagnosis not present

## 2016-09-23 DIAGNOSIS — N400 Enlarged prostate without lower urinary tract symptoms: Secondary | ICD-10-CM | POA: Diagnosis not present

## 2016-09-23 DIAGNOSIS — Z789 Other specified health status: Secondary | ICD-10-CM | POA: Diagnosis not present

## 2016-09-23 DIAGNOSIS — E440 Moderate protein-calorie malnutrition: Secondary | ICD-10-CM | POA: Diagnosis not present

## 2016-09-23 DIAGNOSIS — F10230 Alcohol dependence with withdrawal, uncomplicated: Secondary | ICD-10-CM | POA: Diagnosis not present

## 2016-09-23 DIAGNOSIS — I2510 Atherosclerotic heart disease of native coronary artery without angina pectoris: Secondary | ICD-10-CM | POA: Diagnosis not present

## 2016-09-23 DIAGNOSIS — J189 Pneumonia, unspecified organism: Secondary | ICD-10-CM | POA: Diagnosis not present

## 2016-09-23 DIAGNOSIS — K7030 Alcoholic cirrhosis of liver without ascites: Secondary | ICD-10-CM | POA: Diagnosis not present

## 2016-09-23 DIAGNOSIS — E876 Hypokalemia: Secondary | ICD-10-CM | POA: Diagnosis not present

## 2016-09-23 LAB — BASIC METABOLIC PANEL
Anion gap: 6 (ref 5–15)
BUN: 14 mg/dL (ref 6–20)
CALCIUM: 9.3 mg/dL (ref 8.9–10.3)
CO2: 22 mmol/L (ref 22–32)
CREATININE: 0.72 mg/dL (ref 0.61–1.24)
Chloride: 110 mmol/L (ref 101–111)
GFR calc Af Amer: >60 mL/min (ref >60)
GLUCOSE: 94 mg/dL (ref 65–99)
Potassium: 3.8 mmol/L (ref 3.5–5.1)
Sodium: 138 mmol/L (ref 135–145)

## 2016-09-23 LAB — CBC
HCT: 29.5 % — ABNORMAL LOW (ref 39.0–52.0)
Hemoglobin: 10.3 g/dL — ABNORMAL LOW (ref 13.0–17.0)
MCH: 34.4 pg — AB (ref 26.0–34.0)
MCHC: 34.9 g/dL (ref 30.0–36.0)
MCV: 98.7 fL (ref 78.0–100.0)
Platelets: 383 10*3/uL (ref 150–400)
RBC: 2.99 MIL/uL — ABNORMAL LOW (ref 4.22–5.81)
RDW: 16.4 % — AB (ref 11.5–15.5)
WBC: 4 10*3/uL (ref 4.0–10.5)

## 2016-09-23 MED ORDER — CARVEDILOL 25 MG PO TABS: 25.0000 mg | ORAL_TABLET | Freq: Two times a day (BID) | ORAL | 0 refills | Status: DC

## 2016-09-23 MED ORDER — HYDRALAZINE HCL 25 MG PO TABS: 25.0000 mg | ORAL_TABLET | Freq: Three times a day (TID) | ORAL | 0 refills | Status: DC

## 2016-09-23 MED ORDER — CARVEDILOL 25 MG PO TABS
25.0000 mg | ORAL_TABLET | Freq: Two times a day (BID) | ORAL | Status: DC
  Administered 2016-09-23: 25 mg via ORAL
  Filled 2016-09-23: qty 1

## 2016-09-23 MED ORDER — MIRTAZAPINE 7.5 MG PO TABS: 7.5000 mg | ORAL_TABLET | Freq: Every day | ORAL | Status: DC

## 2016-09-23 MED ORDER — POTASSIUM CHLORIDE CRYS ER 20 MEQ PO TBCR: 40.0000 meq | EXTENDED_RELEASE_TABLET | Freq: Every day | ORAL | Status: DC

## 2016-09-23 MED ORDER — LEVOFLOXACIN 500 MG PO TABS: 500.0000 mg | ORAL_TABLET | Freq: Every day | ORAL | 0 refills | Status: AC

## 2016-09-23 NOTE — Discharge Summary (Signed)
Physician Discharge Summary  Joshua Henson [id number] DOB: 04-18-1946 DOA: 09/13/2016  PCP: Mayra Neer, MD  Admit date: 09/13/2016 Discharge date: 09/23/2016  Admitted From: Home Disposition: [facility] for short term rehab.   Recommendations for Outpatient Follow-up:  1. Follow up with PCP in 1-2 weeks 2. Continue levaquin (last dose 10/9 to complete 7 days) for pneumonia treatment. 3. Discontinued at discharge: tylenol (cirrhotic), wellbutrin (for risk of seizures with alcohol withdrawal), and celexa (pt was not taking).  4. Continue remeron, started for sleep and depression. Titrate as needed. Consider restarting celexa at 10mg  if needed. 5. Please obtain BMP in one week to monitor electrolytes.  Home Health: N/A; going to SNF Equipment/Devices: 3-in-1 recommended. Will be provided at [facility].  Discharge Condition: Stable CODE STATUS: Full Diet recommendation: Heart healthy  Brief/Interim Summary: Joshua Henson is a 70 year old male with history of alcohol abuse and hepatic cirrhosis, stroke, coronary artery disease, hypertension, depression, and anxiety who presented after a fall in the setting of alcohol use. Patient drinks 5 drinks or more of vodka daily, so he was admitted for monitoring of withdrawal. CIWA protocol was used and requirement for ativan slowly decreased. Psychiatry was consulted for evaluation of depression, started on Remeron. Patient had not been taking medications as directed, including celexa and wellbutrin. Intermittent confusion has been reported without tremors. Ammonia is normal. On 10/3, he developed a cough and low-grade fever prompting CXR which showed atelectasis vs. LLL pneumonia on 10/3, so antibiotics were begun. Levaquin should be continued through 10/9.  It is thought that he will benefit from 24 hour supervision, and his girlfriend, with whom she lives, is not able or willing to provide this. Thus, short term rehabilitation at SNF is  required to regain strength from deconditioning related to multiple admissions and acute illness.   Discharge Diagnoses:  Active Problems:   Hearing loss   Essential hypertension   Alcohol withdrawal delirium (HCC)   Fever  Acute alcohol withdrawal: Improving gradually, has been transferred to telemetry on oral ativan with decreasing CIWA scores. Last drink was day PTA (9/25):  - CIWA 3,3,5,5,0 overnight. Ativan taper completed overnight.  - Continue vitamins  - Continue safety precautions.  - He is far enough out from first drink to safely discharge in relation to this problem.  Deconditioning: Related to recurrent C diff infections, bedbound status, and disuse due to alcoholism.  - PT, OT working with him while inpatient - I believe he requires SNF for acute rehabilitation to give him ths best chance at regaining function. Doubt he would do well even with his girlfriend at home and home healthy given they will not be available 24/7. He also presented after a fall.   Pneumonia: Difficult to determine chronology/etiology. Presume healthcare-associated given this was not present on arrival. Could also be aspiration related to intoxication. No urinary symptoms or UA findings.  - Continue levaquin (10/3 >> 10/9) - Flu and respiratory viral studies are negative. - Incentive spirometry.  Depression: Evaluated by psychiatrist, started on Remeron.  - No SI/HI  Essential hypertension: Elevated blood pressure likely in the setting of  alcohol withdrawal.  - Increased dose of coreg to 25mg  BID and added low dose hydralazine. Both continued at discharge.  - Monitor BP.   Hypokalemia, hypomagnesemia, hypophosphatemia: Resolved - Monitor  - Encourage oral intake.  Hepatic cirrhosis: With concomitant thrombocytopenia at baseline, and macrocytic anemia at baseline.  - Continue supportive care and intermittent blood work. - Avoid hepatotoxins. Tylenol stopped.  Discharge  Instructions Discharge Instructions    Diet - low sodium heart healthy    Complete by:  As directed    Discharge instructions    Complete by:  As directed    See discharge summary       Medication List    STOP taking these medications   acetaminophen 325 MG tablet Commonly known as:  TYLENOL   ALPRAZolam 0.5 MG tablet Commonly known as:  XANAX   buPROPion 100 MG 12 hr tablet Commonly known as:  WELLBUTRIN SR   citalopram 20 MG tablet Commonly known as:  CELEXA     TAKE these medications   aspirin 81 MG EC tablet Take 1 tablet (81 mg total) by mouth daily.   atorvastatin 40 MG tablet Commonly known as:  LIPITOR Take 1 tablet (40 mg total) by mouth daily at 6 PM.   carvedilol 25 MG tablet Commonly known as:  COREG Take 1 tablet (25 mg total) by mouth 2 (two) times daily with a meal. What changed:  medication strength  how much to take  Another medication with the same name was removed. Continue taking this medication, and follow the directions you see here.   cholecalciferol 1000 units tablet Commonly known as:  VITAMIN D Take 1,000 Units by mouth 2 (two) times daily.   Co Q-10 200 MG Caps Take 200 mg by mouth daily.   Fish Oil 1000 MG Caps Take 1,000 mg by mouth 2 (two) times daily.   folic acid 1 MG tablet Commonly known as:  FOLVITE Take 1 tablet (1 mg total) by mouth daily.   hydrALAZINE 25 MG tablet Commonly known as:  APRESOLINE Take 1 tablet (25 mg total) by mouth every 8 (eight) hours.   hydrOXYzine 10 MG tablet Commonly known as:  ATARAX/VISTARIL Take 1 tablet (10 mg total) by mouth 3 (three) times daily as needed for anxiety.   levofloxacin 500 MG tablet Commonly known as:  LEVAQUIN Take 1 tablet (500 mg total) by mouth daily. Start taking on:  09/24/2016   LORazepam 1 MG tablet Commonly known as:  ATIVAN Take 1 mg by mouth 2 (two) times daily as needed for anxiety.   Magnesium 500 MG Caps Take 500 mg by mouth at bedtime.    mirtazapine 7.5 MG tablet Commonly known as:  REMERON Take 1 tablet (7.5 mg total) by mouth at bedtime.   multivitamin with minerals Tabs tablet Take 1 tablet by mouth daily.   nitroGLYCERIN 0.4 MG SL tablet Commonly known as:  NITROSTAT Place 1 tablet (0.4 mg total) under the tongue every 5 (five) minutes as needed for chest pain.   potassium chloride SA 20 MEQ tablet Commonly known as:  K-DUR,KLOR-CON Take 2 tablets (40 mEq total) by mouth daily. Start taking on:  09/24/2016 What changed:  how much to take  when to take this   tamsulosin 0.4 MG Caps capsule Commonly known as:  FLOMAX Take 0.4 mg by mouth daily.   thiamine 100 MG tablet Commonly known as:  VITAMIN B-1 Take 100 mg by mouth daily.       Allergies  Allergen Reactions  . Lisinopril     syncope  . Celebrex [Celecoxib] Rash    Consultations:  Dr. Parke Poisson, psychiatry  Procedures/Studies: Dg Chest 1 View  Result Date: 09/20/2016 CLINICAL DATA:  History of fever, tachycardia, smoking history EXAM: CHEST 1 VIEW COMPARISON:  Chest x-ray of 05/09/2016 FINDINGS: Minimally prominent markings at the lung bases may well represent linear  atelectasis, with pneumonia difficult to exclude particularly at the left lung base. No effusion is seen. The heart is mildly enlarged and stable. No bony abnormality is seen. A calcification in the left neck is noted on the frontal view probably vascular in origin. IMPRESSION: Slightly prominent markings at the lung bases left-greater-than-right may represent atelectasis, but pneumonia cannot be excluded. Recommend followup. Electronically Signed   By: Ivar Drape M.D.   On: 09/20/2016 10:16   Dg Thoracic Spine 2 View  Result Date: 08/24/2016 CLINICAL DATA:  Multiple falls recently, initial encounter EXAM: THORACIC SPINE 2 VIEWS COMPARISON:  05/09/2016 FINDINGS: Vertebral body height is well maintained. Mild osteophytic changes are seen. No paraspinal mass lesion is noted. No  definitive rib abnormality is seen. IMPRESSION: Mild degenerative change without acute abnormality. Electronically Signed   By: Inez Catalina M.D.   On: 08/24/2016 19:36   Dg Pelvis 1-2 Views  Result Date: 08/24/2016 CLINICAL DATA:  Multiple falls with pelvic pain, initial encounter EXAM: PELVIS - 1-2 VIEW COMPARISON:  None. FINDINGS: Pelvic ring is intact. No acute fracture or dislocation is seen. No soft tissue abnormality is noted. IMPRESSION: Negative. Electronically Signed   By: Inez Catalina M.D.   On: 08/24/2016 19:37   Ct Head Wo Contrast  Result Date: 09/13/2016 CLINICAL DATA:  Frequent falls. History of alcohol abuse and Meniere's disease EXAM: CT HEAD WITHOUT CONTRAST TECHNIQUE: Contiguous axial images were obtained from the base of the skull through the vertex without intravenous contrast. COMPARISON:  08/26/2016 FINDINGS: Brain: Mild superficial and moderate central atrophy with sulcal and ventricular prominence unchanged in appearance. Chronic appearing small vessel ischemic disease characterized by. Ventricular and coronal radiata hypodensity is again noted. No large vessel territory infarction, acute intracranial mass, hemorrhage, edema or midline shift. Vascular: Atherosclerotic calcifications of the cavernous carotid arteries bilaterally. Skull: No acute osseous abnormality. Sinuses/Orbits: Stable chronic left anterior lobe calcifications unchanged in appearance. Chronic left lamina papyracea fracture deformity. The this retention cyst of the left maxillary sinus. Right maxillary sinus mucosal thickening with hypertrophic changes of the maxillary sinus walls consistent chronic sinusitis. Other: None IMPRESSION: 1. 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 Stable cerebral atrophy. 2. No acute intracranial abnormality. 3. Chronic fracture deformity of the left lamina papyracea. 4. Chronic right maxillary sinusitis. Left maxillary mucous retention cysts.  Electronically Signed   By: Ashley Royalty M.D.   On: 09/13/2016 14:14   Ct Head Wo Contrast  Result Date: 08/24/2016 CLINICAL DATA:  Per EMS pts wife called EMS stating pt has been falling constantly. Pt was in [facility] for rehab and placed on medication for high ammonia levels. According to wife he has stopped taking medications for last 3 weeks and has been heavily drinking. EMS reports abrasion to left forearms and redness noted on thoracic portion of back, no deformity notified. Pt alert and orientated to name and president, not to situation or place. Pt is verbally abusive,agitated and cu patient is noncooperative. 5 versed given en route. Wife states pt has been complaining of left sided hip pain for about a week, unsure if its related to falls. EXAM: CT HEAD WITHOUT CONTRAST CT MAXILLOFACIAL WITHOUT CONTRAST CT CERVICAL SPINE WITHOUT CONTRAST TECHNIQUE: Multidetector CT imaging of the head, cervical spine, and maxillofacial structures were performed using the standard protocol without intravenous contrast. Multiplanar CT image reconstructions of the cervical spine and maxillofacial structures were also generated. COMPARISON:  05/09/2016 FINDINGS: CT HEAD FINDINGS Brain: Periventricular white matter and corona radiata  hypodensities favor chronic ischemic microvascular white matter disease. The brainstem, cerebellum, cerebral peduncles, thalami, basal ganglia, basilar cisterns, and ventricular system appear within normal limits. No intracranial hemorrhage, mass lesion, or acute CVA. Vascular: There is atherosclerotic calcification of the cavernous carotid arteries bilaterally. Skull: Unremarkable Sinuses/Orbits: Deferred to facial CT Other: No supplemental non-categorized findings. CT MAXILLOFACIAL FINDINGS Osseous: Prior left medial orbital wall fracture shown on facial CT of 05/09/2016 is less conspicuous today. There is some calcification along the right anterior globe margin, not changed. No facial  fracture identified. Periapical lucency associated with the right maxillary canine and adjacent right maxillary premolar implant, with demineralization of the maxilla for example on image 38/4. Orbits: Calcification along the left anterior globe as noted previously. No significant intraorbital abnormality observed. Prior tiny medial orbital wall fracture on the left is much less conspicuous today. Sinuses: Mildly expansile mucous retention cyst in the left maxillary sinus with chronic bilateral maxillary sinusitis and mild chronic ethmoid sinusitis. Prior right maxillary antrectomy. Soft tissues: Abnormal subcutaneous edema/ soft tissue swelling overlying the right maxilla/ zygomatic arch, potentially from facial contusion, image 41/3. CT CERVICAL SPINE FINDINGS Alignment: 3 mm degenerative grade 1 anterolisthesis at C6-7 with 2 mm degenerative retrolisthesis at C4-5. Skull base and vertebrae: Prominent loss of articular space at the anterior C1-2 articulation with associated spurring. Fused facet joints bilaterally at C4-5 with extensive facet spurring. Endplate sclerosis and endplate irregularity at C5-6 with posterior osseous ridging. No cervical spine fracture identified. Soft tissues and spinal canal: Unremarkable Disc levels:  C2-3:  No impingement.  Bilateral facet arthropathy. C3-4: Moderate to prominent right and mild left foraminal impingement due to uncinate and facet spurring. C4-5: No definite osseous foraminal impingement. Bilateral facet arthropathy and right uncinate spurring. C5-6: Prominent right and moderate left osseous foraminal stenosis with mild central stenosis due to posterior osseous ridging, uncinate spurring, and facet arthropathy. C6-7:  No impingement.  Bilateral facet arthropathy. C7-T1:  Unremarkable. Upper chest: Unremarkable Other: No supplemental non-categorized findings. IMPRESSION: 1. CT head demonstrates chronic ischemic microvascular white matter disease but no other  significant findings. 2. Maxillofacial CT demonstrates similar findings to the prior exam, including chronic ethmoid and maxillary sinusitis and a calcification along the left anterior globe. More notably, there is continued periapical lucency associated with the right maxillary canine and adjacent right maxillary premolar implant, and periapical abscess is not excluded. There is also a new subcutaneous edema along the right maxilla/zygomatic arch, potentially from facial contusion. 3. Cervical spine demonstrate spondylosis and degenerative disc disease causing prominent impingement at C5-6 and moderate to prominent impingement at C3-4. Electronically Signed   By: Van Clines M.D.   On: 08/24/2016 20:06   Ct Cervical Spine Wo Contrast  Result Date: 08/24/2016 CLINICAL DATA:  Per EMS pts wife called EMS stating pt has been falling constantly. Pt was in [facility] for rehab and placed on medication for high ammonia levels. According to wife he has stopped taking medications for last 3 weeks and has been heavily drinking. EMS reports abrasion to left forearms and redness noted on thoracic portion of back, no deformity notified. Pt alert and orientated to name and president, not to situation or place. Pt is verbally abusive,agitated and cu patient is noncooperative. 5 versed given en route. Wife states pt has been complaining of left sided hip pain for about a week, unsure if its related to falls. EXAM: CT HEAD WITHOUT CONTRAST CT MAXILLOFACIAL WITHOUT CONTRAST CT CERVICAL SPINE WITHOUT CONTRAST TECHNIQUE: Multidetector CT imaging  of the head, cervical spine, and maxillofacial structures were performed using the standard protocol without intravenous contrast. Multiplanar CT image reconstructions of the cervical spine and maxillofacial structures were also generated. COMPARISON:  05/09/2016 FINDINGS: CT HEAD FINDINGS Brain: Periventricular white matter and corona radiata hypodensities favor chronic ischemic  microvascular white matter disease. The brainstem, cerebellum, cerebral peduncles, thalami, basal ganglia, basilar cisterns, and ventricular system appear within normal limits. No intracranial hemorrhage, mass lesion, or acute CVA. Vascular: There is atherosclerotic calcification of the cavernous carotid arteries bilaterally. Skull: Unremarkable Sinuses/Orbits: Deferred to facial CT Other: No supplemental non-categorized findings. CT MAXILLOFACIAL FINDINGS Osseous: Prior left medial orbital wall fracture shown on facial CT of 05/09/2016 is less conspicuous today. There is some calcification along the right anterior globe margin, not changed. No facial fracture identified. Periapical lucency associated with the right maxillary canine and adjacent right maxillary premolar implant, with demineralization of the maxilla for example on image 38/4. Orbits: Calcification along the left anterior globe as noted previously. No significant intraorbital abnormality observed. Prior tiny medial orbital wall fracture on the left is much less conspicuous today. Sinuses: Mildly expansile mucous retention cyst in the left maxillary sinus with chronic bilateral maxillary sinusitis and mild chronic ethmoid sinusitis. Prior right maxillary antrectomy. Soft tissues: Abnormal subcutaneous edema/ soft tissue swelling overlying the right maxilla/ zygomatic arch, potentially from facial contusion, image 41/3. CT CERVICAL SPINE FINDINGS Alignment: 3 mm degenerative grade 1 anterolisthesis at C6-7 with 2 mm degenerative retrolisthesis at C4-5. Skull base and vertebrae: Prominent loss of articular space at the anterior C1-2 articulation with associated spurring. Fused facet joints bilaterally at C4-5 with extensive facet spurring. Endplate sclerosis and endplate irregularity at C5-6 with posterior osseous ridging. No cervical spine fracture identified. Soft tissues and spinal canal: Unremarkable Disc levels:  C2-3:  No impingement.  Bilateral  facet arthropathy. C3-4: Moderate to prominent right and mild left foraminal impingement due to uncinate and facet spurring. C4-5: No definite osseous foraminal impingement. Bilateral facet arthropathy and right uncinate spurring. C5-6: Prominent right and moderate left osseous foraminal stenosis with mild central stenosis due to posterior osseous ridging, uncinate spurring, and facet arthropathy. C6-7:  No impingement.  Bilateral facet arthropathy. C7-T1:  Unremarkable. Upper chest: Unremarkable Other: No supplemental non-categorized findings. IMPRESSION: 1. CT head demonstrates chronic ischemic microvascular white matter disease but no other significant findings. 2. Maxillofacial CT demonstrates similar findings to the prior exam, including chronic ethmoid and maxillary sinusitis and a calcification along the left anterior globe. More notably, there is continued periapical lucency associated with the right maxillary canine and adjacent right maxillary premolar implant, and periapical abscess is not excluded. There is also a new subcutaneous edema along the right maxilla/zygomatic arch, potentially from facial contusion. 3. Cervical spine demonstrate spondylosis and degenerative disc disease causing prominent impingement at C5-6 and moderate to prominent impingement at C3-4. Electronically Signed   By: Van Clines M.D.   On: 08/24/2016 20:06   Dg Hand Complete Right  Result Date: 08/24/2016 CLINICAL DATA:  Multiple recent falls with pain, initial encounter EXAM: RIGHT HAND - COMPLETE 3+ VIEW COMPARISON:  None. FINDINGS: There is a comminuted fracture at the base of the fifth middle phalanx with mild impaction at the fracture site. Mild degenerative changes are noted the first [facility] joint as well as in the interphalangeal joints. IMPRESSION: Fifth middle phalangeal fracture. Mild degenerative changes are seen. Electronically Signed   By: Inez Catalina M.D.   On: 08/24/2016 19:38   Dg Hips Bilat  With Pelvis  3-4 Views  Result Date: 09/13/2016 CLINICAL DATA:  Multiple falls.  Bilateral hip pain. EXAM: DG HIP (WITH OR WITHOUT PELVIS) 3-4V BILAT COMPARISON:  08/24/2016 pelvic radiograph. FINDINGS: No fracture or diastasis in the pelvis. No hip fracture or dislocation. 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 Fiducial markers overlie the prostate. Atherosclerotic change in the internal iliac arteries. No suspicious focal osseous lesion. IMPRESSION: No fracture.  No hip dislocation. Electronically Signed   By: Ilona Sorrel M.D.   On: 09/13/2016 14:01   Ct Maxillofacial Wo Contrast  Result Date: 08/24/2016 CLINICAL DATA:  Per EMS pts wife called EMS stating pt has been falling constantly. Pt was in [facility] for rehab and placed on medication for high ammonia levels. According to wife he has stopped taking medications for last 3 weeks and has been heavily drinking. EMS reports abrasion to left forearms and redness noted on thoracic portion of back, no deformity notified. Pt alert and orientated to name and president, not to situation or place. Pt is verbally abusive,agitated and cu patient is noncooperative. 5 versed given en route. Wife states pt has been complaining of left sided hip pain for about a week, unsure if its related to falls. EXAM: CT HEAD WITHOUT CONTRAST CT MAXILLOFACIAL WITHOUT CONTRAST CT CERVICAL SPINE WITHOUT CONTRAST TECHNIQUE: Multidetector CT imaging of the head, cervical spine, and maxillofacial structures were performed using the standard protocol without intravenous contrast. Multiplanar CT image reconstructions of the cervical spine and maxillofacial structures were also generated. COMPARISON:  05/09/2016 FINDINGS: CT HEAD FINDINGS Brain: Periventricular white matter and corona radiata hypodensities favor chronic ischemic microvascular white matter disease. The brainstem, cerebellum, cerebral peduncles, thalami, basal ganglia, basilar cisterns, and ventricular  system appear within normal limits. No intracranial hemorrhage, mass lesion, or acute CVA. Vascular: There is atherosclerotic calcification of the cavernous carotid arteries bilaterally. Skull: Unremarkable Sinuses/Orbits: Deferred to facial CT Other: No supplemental non-categorized findings. CT MAXILLOFACIAL FINDINGS Osseous: Prior left medial orbital wall fracture shown on facial CT of 05/09/2016 is less conspicuous today. There is some calcification along the right anterior globe margin, not changed. No facial fracture identified. Periapical lucency associated with the right maxillary canine and adjacent right maxillary premolar implant, with demineralization of the maxilla for example on image 38/4. Orbits: Calcification along the left anterior globe as noted previously. No significant intraorbital abnormality observed. Prior tiny medial orbital wall fracture on the left is much less conspicuous today. Sinuses: Mildly expansile mucous retention cyst in the left maxillary sinus with chronic bilateral maxillary sinusitis and mild chronic ethmoid sinusitis. Prior right maxillary antrectomy. Soft tissues: Abnormal subcutaneous edema/ soft tissue swelling overlying the right maxilla/ zygomatic arch, potentially from facial contusion, image 41/3. CT CERVICAL SPINE FINDINGS Alignment: 3 mm degenerative grade 1 anterolisthesis at C6-7 with 2 mm degenerative retrolisthesis at C4-5. Skull base and vertebrae: Prominent loss of articular space at the anterior C1-2 articulation with associated spurring. Fused facet joints bilaterally at C4-5 with extensive facet spurring. Endplate sclerosis and endplate irregularity at C5-6 with posterior osseous ridging. No cervical spine fracture identified. Soft tissues and spinal canal: Unremarkable Disc levels:  C2-3:  No impingement.  Bilateral facet arthropathy. C3-4: Moderate to prominent right and mild left foraminal impingement due to uncinate and facet spurring. C4-5: No definite  osseous foraminal impingement. Bilateral facet arthropathy and right uncinate spurring. C5-6: Prominent right and moderate left osseous foraminal stenosis with mild central stenosis due to posterior osseous ridging, uncinate spurring, and  facet arthropathy. C6-7:  No impingement.  Bilateral facet arthropathy. C7-T1:  Unremarkable. Upper chest: Unremarkable Other: No supplemental non-categorized findings. IMPRESSION: 1. CT head demonstrates chronic ischemic microvascular white matter disease but no other significant findings. 2. Maxillofacial CT demonstrates similar findings to the prior exam, including chronic ethmoid and maxillary sinusitis and a calcification along the left anterior globe. More notably, there is continued periapical lucency associated with the right maxillary canine and adjacent right maxillary premolar implant, and periapical abscess is not excluded. There is also a new subcutaneous edema along the right maxilla/zygomatic arch, potentially from facial contusion. 3. Cervical spine demonstrate spondylosis and degenerative disc disease causing prominent impingement at C5-6 and moderate to prominent impingement at C3-4. Electronically Signed   By: Van Clines M.D.   On: 08/24/2016 20:06    Subjective: Pt without complaints other than not sleeping well, feels tired. No fever, cough, chest pain, dyspnea, tremors. Anxiety improving on remeron. Feels mentation is improving as ativan is stopped.   Discharge Exam: Vitals:   09/23/16 0628 09/23/16 0940  BP: 131/77 (!) 171/103  Pulse: 80 90  Resp: 18   Temp: 98.7 F (37.1 C)    Vitals:   09/22/16 1643 09/22/16 2054 09/23/16 0628 09/23/16 0940  BP: 140/80 (!) 157/102 131/77 (!) 171/103  Pulse: 92 76 80 90  Resp:  20 18   Temp:  98.2 F (36.8 C) 98.7 F (37.1 C)   TempSrc:  Oral Oral   SpO2:  100% 100%   Weight:      Height:       General: Pt is alert, awake, not in acute distress Cardiovascular: RRR, S1/S2 +, no rubs, no  gallops Respiratory: CTA bilaterally, no wheezing, no rhonchi Abdominal: Soft, NT, ND, bowel sounds + Extremities: no edema, no cyanosis Psych: No SI/HI.   The results of significant diagnostics from this hospitalization (including imaging, microbiology, ancillary and laboratory) are listed below for reference.    Microbiology: Recent Results (from the past 240 hour(s))  MRSA PCR Screening     Status: None   Collection Time: 09/13/16  6:06 PM  Result Value Ref Range Status   MRSA by PCR NEGATIVE NEGATIVE Final    Comment:        The GeneXpert MRSA Assay (FDA approved for NASAL specimens only), is one component of a comprehensive MRSA colonization surveillance program. It is not intended to diagnose MRSA infection nor to guide or monitor treatment for MRSA infections.   Respiratory Panel by PCR     Status: None   Collection Time: 09/20/16  1:14 PM  Result Value Ref Range Status   Adenovirus NOT DETECTED NOT DETECTED Final   Coronavirus 229E NOT DETECTED NOT DETECTED Final   Coronavirus HKU1 NOT DETECTED NOT DETECTED Final   Coronavirus NL63 NOT DETECTED NOT DETECTED Final   Coronavirus OC43 NOT DETECTED NOT DETECTED Final   Metapneumovirus NOT DETECTED NOT DETECTED Final   Rhinovirus / Enterovirus NOT DETECTED NOT DETECTED Final   Influenza A NOT DETECTED NOT DETECTED Final   Influenza B NOT DETECTED NOT DETECTED Final   Parainfluenza Virus 1 NOT DETECTED NOT DETECTED Final   Parainfluenza Virus 2 NOT DETECTED NOT DETECTED Final   Parainfluenza Virus 3 NOT DETECTED NOT DETECTED Final   Parainfluenza Virus 4 NOT DETECTED NOT DETECTED Final   Respiratory Syncytial Virus NOT DETECTED NOT DETECTED Final   Bordetella pertussis NOT DETECTED NOT DETECTED Final   Chlamydophila pneumoniae NOT DETECTED NOT DETECTED Final  Mycoplasma pneumoniae NOT DETECTED NOT DETECTED Final    Comment: Performed at [place]: BNP (last 3 results) No results for input(s):  BNP in the last 8760 hours. Basic Metabolic Panel:  Recent Labs Lab 09/17/16 1025  09/19/16 0310 09/20/16 0524 09/21/16 1342 09/22/16 0459 09/23/16 0546  NA  --   < > 137 138 137 138 138  K  --   < > 3.5 3.6 4.3 4.0 3.8  CL  --   < > 106 108 110 109 110  CO2  --   < > 21* 23 22 23 22   GLUCOSE  --   < > 105* 100* 107* 108* 94  BUN  --   < > 12 10 16 18 14   CREATININE  --   < > 0.67 0.73 0.68 0.80 0.72  CALCIUM  --   < > 8.8* 9.0 9.1 9.5 9.3  MG 1.7  --  1.5* 2.1  --   --   --   PHOS 2.8  --  2.9 3.6  --   --   --   < > = values in this interval not displayed. Liver Function Tests:  Recent Labs Lab 09/18/16 0318  AST 42*  ALT 35  ALKPHOS 69  BILITOT 0.9  PROT 6.1*  ALBUMIN 2.8*   No results for input(s): LIPASE, AMYLASE in the last 168 hours.  Recent Labs Lab 09/22/16 0459  AMMONIA 20   CBC:  Recent Labs Lab 09/18/16 0318 09/22/16 0459 09/23/16 0546  WBC 4.9 4.4 4.0  HGB 10.2* 10.2* 10.3*  HCT 28.3* 30.1* 29.5*  MCV 99.0 102.7* 98.7  PLT 151 326 383   Cardiac Enzymes: No results for input(s): CKTOTAL, CKMB, CKMBINDEX, TROPONINI in the last 168 hours. BNP: Invalid input(s): POCBNP CBG: No results for input(s): GLUCAP in the last 168 hours. D-Dimer No results for input(s): DDIMER in the last 72 hours. Hgb A1c No results for input(s): HGBA1C in the last 72 hours. Lipid Profile No results for input(s): CHOL, HDL, LDLCALC, TRIG, CHOLHDL, LDLDIRECT in the last 72 hours. Thyroid function studies No results for input(s): TSH, T4TOTAL, T3FREE, THYROIDAB in the last 72 hours.  Invalid input(s): FREET3 Anemia work up No results for input(s): VITAMINB12, FOLATE, FERRITIN, TIBC, IRON, RETICCTPCT in the last 72 hours. Urinalysis    Component Value Date/Time   COLORURINE YELLOW 09/20/2016 1047   APPEARANCEUR CLOUDY (A) 09/20/2016 1047   LABSPEC 1.014 09/20/2016 1047   PHURINE 6.5 09/20/2016 1047   GLUCOSEU NEGATIVE 09/20/2016 1047   HGBUR NEGATIVE  09/20/2016 1047   BILIRUBINUR NEGATIVE 09/20/2016 1047   KETONESUR NEGATIVE 09/20/2016 1047   PROTEINUR NEGATIVE 09/20/2016 1047   UROBILINOGEN 2.0 (H) 04/02/2015 2013   NITRITE NEGATIVE 09/20/2016 1047   LEUKOCYTESUR TRACE (A) 09/20/2016 1047   Sepsis Labs Invalid input(s): PROCALCITONIN,  WBC,  LACTICIDVEN Microbiology Recent Results (from the past 240 hour(s))  MRSA PCR Screening     Status: None   Collection Time: 09/13/16  6:06 PM  Result Value Ref Range Status   MRSA by PCR NEGATIVE NEGATIVE Final    Comment:        The GeneXpert MRSA Assay (FDA approved for NASAL specimens only), is one component of a comprehensive MRSA colonization surveillance program. It is not intended to diagnose MRSA infection nor to guide or monitor treatment for MRSA infections.   Respiratory Panel by PCR     Status: None   Collection Time: 09/20/16  1:14 PM  Result Value Ref Range Status   Adenovirus NOT DETECTED NOT DETECTED Final   Coronavirus 229E NOT DETECTED NOT DETECTED Final   Coronavirus HKU1 NOT DETECTED NOT DETECTED Final   Coronavirus NL63 NOT DETECTED NOT DETECTED Final   Coronavirus OC43 NOT DETECTED NOT DETECTED Final   Metapneumovirus NOT DETECTED NOT DETECTED Final   Rhinovirus / Enterovirus NOT DETECTED NOT DETECTED Final   Influenza A NOT DETECTED NOT DETECTED Final   Influenza B NOT DETECTED NOT DETECTED Final   Parainfluenza Virus 1 NOT DETECTED NOT DETECTED Final   Parainfluenza Virus 2 NOT DETECTED NOT DETECTED Final   Parainfluenza Virus 3 NOT DETECTED NOT DETECTED Final   Parainfluenza Virus 4 NOT DETECTED NOT DETECTED Final   Respiratory Syncytial Virus NOT DETECTED NOT DETECTED Final   Bordetella pertussis NOT DETECTED NOT DETECTED Final   Chlamydophila pneumoniae NOT DETECTED NOT DETECTED Final   Mycoplasma pneumoniae NOT DETECTED NOT DETECTED Final    Comment: Performed at [facility]    Time coordinating discharge: Over 64 minutes  Vance Gather,  MD  [facility] 09/23/2016, 12:44 PM Pager [phone]  If 7PM-7AM, please contact night-coverage [email or web address] Password [email or web address]

## 2016-09-23 NOTE — Progress Notes (Signed)
OT Cancellation Note  Patient Details Name: Joshua Henson MRN: [id number] DOB: 01-10-1946   Cancelled Treatment:    Reason Eval/Treat Not Completed: Other (comment) -- Attempted OT session. Patient asleep; arousable to voice. States he wants to sleep because he didn't get much sleep last night. Asked OT to come back at a later time. OT will follow up per plan of care.  Mikiah Demond A 09/23/2016, 10:55 AM

## 2016-09-23 NOTE — Care Management Note (Signed)
Case Management Note  Patient Details  Name: Joshua Henson MRN: [id number] Date of Birth: Mar 12, 1946  Subjective/Objective:                    Action/Plan:d/c SNF.    Expected Discharge Date:   (unknown)               Expected Discharge Plan:  Skilled Nursing Facility  In-House Referral:  Clinical Social Work  Discharge planning Services  CM Consult  Post Acute Care Choice:    Choice offered to:     DME Arranged:    DME Agency:     HH Arranged:    [place] Agency:     Status of Service:  Completed, signed off  If discussed at [employer], dates discussed:    Additional Comments:  Dessa Phi, RN 09/23/2016, 1:04 PM

## 2016-09-23 NOTE — Clinical Social Work Placement (Signed)
Patient is set to discharge to [facility] today. Patient & girlfriend, [name] aware. Discharge packet given to RN, Sonia Baller. [facility] called for transport.     Raynaldo Opitz, [facility] Clinical Social Worker cell #: [phone]    CLINICAL SOCIAL WORK PLACEMENT  NOTE  Date:  09/23/2016  Patient Details  Name: Joshua Henson MRN: [id number] Date of Birth: July 18, 1946  Clinical Social Work is seeking post-discharge placement for this patient at the [place] level of care (*CSW will initial, date and re-position this form in  chart as items are completed):  Yes   Patient/family provided with [place] Work Department's list of facilities offering this level of care within the geographic area requested by the patient (or if unable, by the patient's family).  Yes   Patient/family informed of their freedom to choose among providers that offer the needed level of care, that participate in Medicare, Medicaid or managed care program needed by the patient, have an available bed and are willing to accept the patient.  Yes   Patient/family informed of [facility]'s ownership interest in [facility] and [facility], as well as of the fact that they are under no obligation to receive care at these facilities.  PASRR submitted to EDS on       PASRR number received on       Existing PASRR number confirmed on 09/22/16     FL2 transmitted to all facilities in geographic area requested by pt/family on 09/22/16     FL2 transmitted to all facilities within larger geographic area on       Patient informed that his/her managed care company has contracts with or will negotiate with certain facilities, including the following:        Yes   Patient/family informed of bed offers received.  Patient chooses bed at [facility]     Physician recommends and patient chooses bed at      Patient to be transferred to [facility] on  09/23/16.  Patient to be transferred to facility by [facility]     Patient family notified on 09/23/16 of transfer.  Name of family member notified:  patient's girlfriend, [name] via phone     PHYSICIAN       Additional Comment:    _______________________________________________ Standley Brooking, LCSW 09/23/2016, 1:57 PM

## 2016-09-26 ENCOUNTER — Encounter: Payer: Self-pay | Admitting: Adult Health

## 2016-09-26 ENCOUNTER — Non-Acute Institutional Stay (SKILLED_NURSING_FACILITY): Payer: Medicare Other | Admitting: Adult Health

## 2016-09-26 DIAGNOSIS — F329 Major depressive disorder, single episode, unspecified: Secondary | ICD-10-CM | POA: Diagnosis not present

## 2016-09-26 DIAGNOSIS — R5381 Other malaise: Secondary | ICD-10-CM | POA: Diagnosis not present

## 2016-09-26 DIAGNOSIS — J189 Pneumonia, unspecified organism: Secondary | ICD-10-CM

## 2016-09-26 DIAGNOSIS — F10231 Alcohol dependence with withdrawal delirium: Secondary | ICD-10-CM | POA: Diagnosis not present

## 2016-09-26 DIAGNOSIS — E785 Hyperlipidemia, unspecified: Secondary | ICD-10-CM

## 2016-09-26 DIAGNOSIS — F10931 Alcohol use, unspecified with withdrawal delirium: Secondary | ICD-10-CM

## 2016-09-26 DIAGNOSIS — F32A Depression, unspecified: Secondary | ICD-10-CM

## 2016-09-26 DIAGNOSIS — N400 Enlarged prostate without lower urinary tract symptoms: Secondary | ICD-10-CM

## 2016-09-26 DIAGNOSIS — E876 Hypokalemia: Secondary | ICD-10-CM

## 2016-09-26 DIAGNOSIS — F419 Anxiety disorder, unspecified: Secondary | ICD-10-CM

## 2016-09-26 DIAGNOSIS — I10 Essential (primary) hypertension: Secondary | ICD-10-CM

## 2016-09-26 DIAGNOSIS — E43 Unspecified severe protein-calorie malnutrition: Secondary | ICD-10-CM

## 2016-09-26 DIAGNOSIS — I2510 Atherosclerotic heart disease of native coronary artery without angina pectoris: Secondary | ICD-10-CM

## 2016-09-26 DIAGNOSIS — K7460 Unspecified cirrhosis of liver: Secondary | ICD-10-CM

## 2016-09-26 NOTE — Progress Notes (Signed)
Patient ID: Joshua Henson, male   DOB: [birth year], 70 y.o.   MRN: [id number]    DATE:  09/26/2016   MRN:  [id number]  BIRTHDAY: 11-19-1946  Facility:  Nursing Home Location:  [place] Room Number: [id number]  LEVEL OF CARE:  SNF (31)  Contact Information    Name Relation Home Work [place] Significant other [place]   [phone]       Code Status History    Date Active Date Inactive Code Status Order ID Comments User Context   09/13/2016  6:30 PM 09/23/2016  5:59 PM Full Code [id number]  Mariel Aloe, MD Inpatient   05/10/2016  1:49 AM 05/13/2016  4:51 PM Full Code [id number]  Toy Baker, MD ED   03/23/2016  4:48 PM 04/04/2016  4:36 PM Full Code [id number]  Cristal Ford, DO Inpatient   01/18/2016  9:25 AM 01/19/2016  9:46 PM Full Code [id number]  Cherene Altes, MD Inpatient   01/15/2016  3:37 PM 01/18/2016  9:25 AM Full Code [id number]  Burnell Blanks, MD Inpatient   01/10/2016  9:58 PM 01/15/2016  3:37 PM Full Code [id number]  Norval Morton, MD Inpatient   04/02/2015  7:29 PM 04/08/2015  7:38 PM Full Code [id number]  Deneise Lever, MD ED   12/09/2014 10:27 PM 12/11/2014 12:14 PM Full Code [id number]  Evelina Bucy, MD ED   12/09/2014  4:22 PM 12/09/2014 10:27 PM Full Code [id number]  Charlott Rakes, MD ED   09/03/2014  1:19 AM 09/12/2014  3:50 PM Full Code [id number]  Allie Bossier, MD ED   07/07/2014  4:31 PM 07/11/2014  5:39 PM Full Code [id number]  Cathlyn Parsons, PA-C Inpatient   07/02/2014  3:38 AM 07/07/2014  4:31 PM Full Code [id number]  Theressa Millard, MD Inpatient       Chief Complaint  Patient presents with  . Hospitalization Follow-up    HISTORY OF PRESENT ILLNESS:  This is a 70 year old male who has a PMH of alcohol abuse, hepatic cirrhosis, stroke, CAD, hypertension, depression and anxiety. He has been admitted to [facility] on 09/23/16. He drinks 5 drinks or more  of vodka daily. He had a fall in the setting of alcohol use. He was put on CIWA protocol requiring Ativan. Psychiatry was consulted for evaluation of depression. He was started on Remeron. He developed cough and fever. CXR showed atelectasis vs. LLL PNA on 10/03 so ATB was started. Levaquin will be continued till today, 09/26/16. He has been admitted for short-term rehabilitation.     PAST MEDICAL HISTORY:  Past Medical History:  Diagnosis Date  . Acute hepatic encephalopathy   . Alcoholic cirrhosis of liver without ascites ([place])   . Anxiety state   . CAD (coronary artery disease), native coronary artery 01/10/2016   3 vessel coronary calcification noted on CT scan 04/03/15    . Depression   . Fatty liver, alcoholic [id number]  . Hypertension   . Increased ammonia level   . Insomnia   . NSTEMI (non-ST elevated myocardial infarction) ([place])   . Prostate cancer ([place]) 2010   External beam radiation (urol - Risa Grill, XRT Valere Dross)  . Stroke ([place])   . Tachycardia      CURRENT MEDICATIONS: Reviewed  Patient's Medications  New Prescriptions   No medications on file  Previous Medications   ASPIRIN 81 MG EC TABLET    Take 1  tablet (81 mg total) by mouth daily.   ATORVASTATIN (LIPITOR) 40 MG TABLET    Take 1 tablet (40 mg total) by mouth daily at 6 PM.   CARVEDILOL (COREG) 25 MG TABLET    Take 1 tablet (25 mg total) by mouth 2 (two) times daily with a meal.   CHOLECALCIFEROL (VITAMIN D) 1000 UNITS TABLET    Take 1,000 Units by mouth 2 (two) times daily.    COENZYME Q10 (CO Q-10) 200 MG CAPS    Take 200 mg by mouth daily.   FOLIC ACID (FOLVITE) 1 MG TABLET    Take 1 tablet (1 mg total) by mouth daily.   HYDRALAZINE (APRESOLINE) 25 MG TABLET    Take 25 mg by mouth 3 (three) times daily.   HYDROXYZINE (ATARAX/VISTARIL) 10 MG TABLET    Take 1 tablet (10 mg total) by mouth 3 (three) times daily as needed for anxiety.   LEVOFLOXACIN (LEVAQUIN) 500 MG TABLET    Take 1 tablet (500 mg total) by mouth  daily.   LORAZEPAM (ATIVAN) 1 MG TABLET    Take 1 mg by mouth 2 (two) times daily as needed for anxiety.   MAGNESIUM 500 MG CAPS    Take 500 mg by mouth at bedtime.   MIRTAZAPINE (REMERON) 7.5 MG TABLET    Take 1 tablet (7.5 mg total) by mouth at bedtime.   MULTIPLE VITAMIN (MULTIVITAMIN WITH MINERALS) TABS TABLET    Take 1 tablet by mouth daily.   NITROGLYCERIN (NITROSTAT) 0.4 MG SL TABLET    Place 1 tablet (0.4 mg total) under the tongue every 5 (five) minutes as needed for chest pain.   OMEGA-3 FATTY ACIDS (FISH OIL) 1000 MG CAPS    Take 1,000 mg by mouth 2 (two) times daily.    POTASSIUM CHLORIDE SA (K-DUR,KLOR-CON) 20 MEQ TABLET    Take 2 tablets (40 mEq total) by mouth daily.   TAMSULOSIN (FLOMAX) 0.4 MG CAPS CAPSULE    Take 0.4 mg by mouth daily.    THIAMINE (VITAMIN B-1) 100 MG TABLET    Take 100 mg by mouth daily.  Modified Medications   No medications on file  Discontinued Medications   HYDRALAZINE (APRESOLINE) 25 MG TABLET    Take 1 tablet (25 mg total) by mouth every 8 (eight) hours.     Allergies  Allergen Reactions  . Lisinopril     syncope  . Celebrex [Celecoxib] Rash     REVIEW OF SYSTEMS:  GENERAL: no change in appetite, no fatigue, no weight changes, no fever, chills or weakness EYES: Denies change in vision, dry eyes, eye pain, itching or discharge EARS: Denies change in hearing, ringing in ears, or earache NOSE: Denies nasal congestion or epistaxis MOUTH and THROAT: Denies oral discomfort, gingival pain or bleeding, pain from teeth or hoarseness   RESPIRATORY: no cough, SOB, DOE, wheezing, hemoptysis CARDIAC: no chest pain, edema or palpitations GI: no abdominal pain, diarrhea, constipation, heart burn, nausea or vomiting GU: Denies dysuria, frequency, hematuria, incontinence, or discharge PSYCHIATRIC: Denies feeling of depression or anxiety. No report of hallucinations, insomnia, paranoia, or agitation     PHYSICAL EXAMINATION  GENERAL APPEARANCE: Well  nourished. In no acute distress. Normal body habitus SKIN:  Scabbed wound on right knee HEAD: Normal in size and contour. No evidence of trauma EYES: Lids open and close normally. No blepharitis, entropion or ectropion. PERRL. Conjunctivae are clear and sclerae are white. Lenses are without opacity EARS: Pinnae are normal. Patient hears normal  voice tunes of the examiner MOUTH and THROAT: Lips are without lesions. Oral mucosa is moist and without lesions. Tongue is normal in shape, size, and color and without lesions NECK: supple, trachea midline, no neck masses, no thyroid tenderness, no thyromegaly LYMPHATICS: no LAN in the neck, no supraclavicular LAN RESPIRATORY: breathing is even & unlabored, BS CTAB CARDIAC: RRR, no murmur,no extra heart sounds, no edema GI: abdomen soft, normal BS, no masses, no tenderness, no hepatomegaly, no splenomegaly EXTREMITIES:  Able to move X 4 extremities PSYCHIATRIC: Alert and oriented X 3. Affect and behavior are appropriate  LABS/RADIOLOGY: Labs reviewed: Basic Metabolic Panel:  Recent Labs  09/17/16 1025  09/19/16 0310 09/20/16 0524 09/21/16 1342 09/22/16 0459 09/23/16 0546  NA  --   < > 137 138 137 138 138  K  --   < > 3.5 3.6 4.3 4.0 3.8  CL  --   < > 106 108 110 109 110  CO2  --   < > 21* 23 22 23 22   GLUCOSE  --   < > 105* 100* 107* 108* 94  BUN  --   < > 12 10 16 18 14   CREATININE  --   < > 0.67 0.73 0.68 0.80 0.72  CALCIUM  --   < > 8.8* 9.0 9.1 9.5 9.3  MG 1.7  --  1.5* 2.1  --   --   --   PHOS 2.8  --  2.9 3.6  --   --   --   < > = values in this interval not displayed. Liver Function Tests:  Recent Labs  09/13/16 1414 09/14/16 0401 09/18/16 0318  AST 116* 93* 42*  ALT 66* 58 35  ALKPHOS 92 88 69  BILITOT 1.7* 1.4* 0.9  PROT 6.7 6.5 6.1*  ALBUMIN 3.9 3.8 2.8*    Recent Labs  08/24/16 1855 09/13/16 1426 09/22/16 0459  AMMONIA 17 41* 20   CBC:  Recent Labs  05/20/16 08/24/16 1855 09/13/16 1414  09/18/16 0318  09/22/16 0459 09/23/16 0546  WBC 7.4 7.5 5.0  < > 4.9 4.4 4.0  NEUTROABS 4,440 4.9 3.3  --   --   --   --   HGB 12.2* 13.8 11.7*  < > 10.2* 10.2* 10.3*  HCT 36* 39.4 32.7*  < > 28.3* 30.1* 29.5*  MCV  --  94.0 95.6  < > 99.0 102.7* 98.7  PLT 392 213 151  < > 151 326 383  < > = values in this interval not displayed.  Lipid Panel:  Recent Labs  05/18/16  HDL 38   Cardiac Enzymes:  Recent Labs  01/10/16 1712  01/11/16 0528 05/09/16 2305 05/10/16 0456  CKTOTAL 47*  --  82  --   --   CKMB 1.3  --   --   --   --   TROPONINI 4.59*  < > 4.46* 11.59* 11.85*  < > = values in this interval not displayed.  CBG:  Recent Labs  05/09/16 2032 05/10/16 1156 05/11/16 1205  GLUCAP 104* 102* 139*      Dg Chest 1 View  Result Date: 09/20/2016 CLINICAL DATA:  History of fever, tachycardia, smoking history EXAM: CHEST 1 VIEW COMPARISON:  Chest x-ray of 05/09/2016 FINDINGS: Minimally prominent markings at the lung bases may well represent linear atelectasis, with pneumonia difficult to exclude particularly at the left lung base. No effusion is seen. The heart is mildly enlarged and stable. No bony abnormality is seen.  A calcification in the left neck is noted on the frontal view probably vascular in origin. IMPRESSION: Slightly prominent markings at the lung bases left-greater-than-right may represent atelectasis, but pneumonia cannot be excluded. Recommend followup. Electronically Signed   By: Ivar Drape M.D.   On: 09/20/2016 10:16   Ct Head Wo Contrast  Result Date: 09/13/2016 CLINICAL DATA:  Frequent falls. History of alcohol abuse and Meniere's disease EXAM: CT HEAD WITHOUT CONTRAST TECHNIQUE: Contiguous axial images were obtained from the base of the skull through the vertex without intravenous contrast. COMPARISON:  08/26/2016 FINDINGS: Brain: Mild superficial and moderate central atrophy with sulcal and ventricular prominence unchanged in appearance. Chronic appearing small vessel  ischemic disease characterized by. Ventricular and coronal radiata hypodensity is again noted. No large vessel territory infarction, acute intracranial mass, hemorrhage, edema or midline shift. Vascular: Atherosclerotic calcifications of the cavernous carotid arteries bilaterally. Skull: No acute osseous abnormality. Sinuses/Orbits: Stable chronic left anterior lobe calcifications unchanged in appearance. Chronic left lamina papyracea fracture deformity. The this retention cyst of the left maxillary sinus. Right maxillary sinus mucosal thickening with hypertrophic changes of the maxillary sinus walls consistent chronic sinusitis. Other: None IMPRESSION: 1. 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 Stable cerebral atrophy. 2. No acute intracranial abnormality. 3. Chronic fracture deformity of the left lamina papyracea. 4. Chronic right maxillary sinusitis. Left maxillary mucous retention cysts. Electronically Signed   By: Ashley Royalty M.D.   On: 09/13/2016 14:14   Dg Hips Bilat With Pelvis 3-4 Views  Result Date: 09/13/2016 CLINICAL DATA:  Multiple falls.  Bilateral hip pain. EXAM: DG HIP (WITH OR WITHOUT PELVIS) 3-4V BILAT COMPARISON:  08/24/2016 pelvic radiograph. FINDINGS: No fracture or diastasis in the pelvis. No hip fracture or dislocation. 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 Fiducial markers overlie the prostate. Atherosclerotic change in the internal iliac arteries. No suspicious focal osseous lesion. IMPRESSION: No fracture.  No hip dislocation. Electronically Signed   By: Ilona Sorrel M.D.   On: 09/13/2016 14:01    ASSESSMENT/PLAN:  Physical deconditioning - for rehabilitation, PT and OT, for therapeutic strengthening exercises; fall precaution  HCAP - continue Levaquin 500 mg 1 tab PO Q D till 10/09  Alcohol withdrawal - continue Ativan 1 mg 1 tab PO BID PRN, Folic acid 1 mg 1 tab PO Q D  Depression - continue Mirtazepine 7.5 mg  1 tab PO Q HS  Essential hypertension - continue Coreg 25 mg 1 tab PO BID  Hepatic cirrhosis - check CMP in 1 week  Protein-calorie malnutrition, severe - albumin 2.8; RD consult  Hypokalemia - continue KCL ER 20 meq give 2 tabs = 40 meq PO Q D Lab Results  Component Value Date   K 3.8 09/23/2016   BPH - continue Flomax 0.4 mg 1 capsule PO Q D  Hyperlipidemia - continue Lipitor 40 mg 1 tab PO Q D  Anxiety - continue Hydroxyzine 25 mg 1 tab PO TID PRN  CAD - continue ASA 81 mg 1 tab PO daily,  NTG PRN  Hypomagnesemia - continue Magnesium 500 mg 1 tab PO Q HS  Anemia of chronic disease - hgb 10.3; check CBC in 1 week    Goals of care:  Short-term rehabilitation     Durenda Age, NP [place] [phone]

## 2016-09-27 ENCOUNTER — Encounter: Payer: Self-pay | Admitting: Internal Medicine

## 2016-09-27 ENCOUNTER — Non-Acute Institutional Stay (SKILLED_NURSING_FACILITY): Payer: Medicare Other | Admitting: Internal Medicine

## 2016-09-27 DIAGNOSIS — D638 Anemia in other chronic diseases classified elsewhere: Secondary | ICD-10-CM | POA: Diagnosis not present

## 2016-09-27 DIAGNOSIS — R2681 Unsteadiness on feet: Secondary | ICD-10-CM

## 2016-09-27 DIAGNOSIS — E876 Hypokalemia: Secondary | ICD-10-CM | POA: Diagnosis not present

## 2016-09-27 DIAGNOSIS — N3000 Acute cystitis without hematuria: Secondary | ICD-10-CM | POA: Diagnosis not present

## 2016-09-27 DIAGNOSIS — E785 Hyperlipidemia, unspecified: Secondary | ICD-10-CM

## 2016-09-27 DIAGNOSIS — F1090 Alcohol use, unspecified, uncomplicated: Secondary | ICD-10-CM

## 2016-09-27 DIAGNOSIS — Z789 Other specified health status: Secondary | ICD-10-CM | POA: Diagnosis not present

## 2016-09-27 DIAGNOSIS — I2510 Atherosclerotic heart disease of native coronary artery without angina pectoris: Secondary | ICD-10-CM | POA: Diagnosis not present

## 2016-09-27 DIAGNOSIS — E440 Moderate protein-calorie malnutrition: Secondary | ICD-10-CM | POA: Diagnosis not present

## 2016-09-27 DIAGNOSIS — R5381 Other malaise: Secondary | ICD-10-CM

## 2016-09-27 DIAGNOSIS — I10 Essential (primary) hypertension: Secondary | ICD-10-CM | POA: Diagnosis not present

## 2016-09-27 DIAGNOSIS — K7030 Alcoholic cirrhosis of liver without ascites: Secondary | ICD-10-CM

## 2016-09-27 DIAGNOSIS — Z7289 Other problems related to lifestyle: Secondary | ICD-10-CM

## 2016-09-27 DIAGNOSIS — F411 Generalized anxiety disorder: Secondary | ICD-10-CM

## 2016-09-27 NOTE — Progress Notes (Signed)
LOCATION: [facility]  PCP: Mayra Neer, MD   Code Status: Full Code  Goals of care: Advanced Directive information Advanced Directives 09/13/2016  Does patient have an advance directive? No  Type of Advance Directive -  Would patient like information on creating an advanced directive? No - patient declined information  Pre-existing out of facility DNR order (yellow form or pink MOST form) -  Some encounter information is confidential and restricted. Go to Review Flowsheets activity to see all data.       Extended Emergency Contact Information Primary Emergency Contact: Tilghman,Michelle Address: [address]          [place], [place] [address] [place] Phone: [phone] Mobile Phone: [phone] Relation: Significant other Secondary Emergency Contact: Ann Maki [place] Mobile Phone: [phone] Relation: Friend   Allergies  Allergen Reactions  . Lisinopril     syncope  . Celebrex [Celecoxib] Rash    Chief Complaint  Patient presents with  . New Admit To SNF    New Admission Visit     HPI:  Patient is a 70 y.o. Henson seen today for short term rehabilitation post hospital admission from 09/13/16-09/23/16 with alcohol withdrawal and pneumonia. He has PMH of alcohol abuse, liver cirrhosis, CVA, CAD, HTN, depression among others. CIWA protocol was started in hospital and psychiatry was consulted. He was started on remeron and his wellbutrin was discontinued given risk for seizure with intake of etoh. He was started on antibiotics for pneumonia. He is seen in his room today.   Review of Systems:  Constitutional: Negative for fever, chills, diaphoresis. Feels weak and tired. HENT: Negative for headache, congestion, nasal discharge. Eyes: Negative for blurred vision, double vision and discharge. Wears glasses. Respiratory: Negative for cough, shortness of breath and wheezing.   Cardiovascular: Negative for chest pain,  palpitations, leg swelling.  Gastrointestinal: Negative for heartburn, nausea, vomiting, abdominal pain. Last bowel movement was yesterday.  Genitourinary: Negative for dysuria and flank pain.  Musculoskeletal: Negative for back pain, fall in the facility.  Skin: Negative for itching, rash.  Neurological: Negative for dizziness. Psychiatric/Behavioral: Negative for depression   Past Medical History:  Diagnosis Date  . Acute hepatic encephalopathy   . Alcoholic cirrhosis of liver without ascites ([facility])   . Anxiety state   . CAD (coronary artery disease), native coronary artery 01/10/2016   3 vessel coronary calcification noted on CT scan 04/03/15    . Depression   . Fatty liver, alcoholic [id number]  . Hypertension   . Increased ammonia level   . Insomnia   . NSTEMI (non-ST elevated myocardial infarction) ([place] [facility])   . Prostate cancer ([facility]) 2010   External beam radiation (urol - Risa Grill, XRT Valere Dross)  . Stroke ([place])   . Tachycardia    Past Surgical History:  Procedure Laterality Date  . CARDIAC CATHETERIZATION N/A 01/15/2016   Procedure: Left Heart Cath and Coronary Angiography;  Surgeon: Burnell Blanks, MD;  Location: [facility];  Service: Cardiovascular;  Laterality: N/A;   Social History:   reports that he has been smoking Cigarettes.  He has a 15.00 pack-year smoking history. He has never used smokeless tobacco. He reports that he drinks about 33.6 oz of alcohol per week . He reports that he does not use drugs.  Family History  Problem Relation Age of Onset  . Cancer Mother     esophageal  . Hypertension Mother   . Diabetes Mother   . Cancer Father  prostate  . Heart disease Maternal Grandfather     MI  . Cancer Paternal Grandfather     prostate    Medications:   Medication List       Accurate as of 09/27/16  2:52 PM. Always use your most recent med list.          aspirin 81 MG EC tablet Take 1 tablet (81 mg total) by mouth daily.     atorvastatin 40 MG tablet Commonly known as:  LIPITOR Take 1 tablet (40 mg total) by mouth daily at 6 PM.   carvedilol 25 MG tablet Commonly known as:  COREG Take 1 tablet (25 mg total) by mouth 2 (two) times daily with a meal.   cholecalciferol 1000 units tablet Commonly known as:  VITAMIN D Take 1,000 Units by mouth 2 (two) times daily.   Co Q-10 200 MG Caps Take 200 mg by mouth daily.   Fish Oil 1000 MG Caps Take 1,000 mg by mouth 2 (two) times daily.   folic acid 1 MG tablet Commonly known as:  FOLVITE Take 1 tablet (1 mg total) by mouth daily.   hydrALAZINE 25 MG tablet Commonly known as:  APRESOLINE Take 25 mg by mouth 3 (three) times daily.   hydrOXYzine 10 MG tablet Commonly known as:  ATARAX/VISTARIL Take 1 tablet (10 mg total) by mouth 3 (three) times daily as needed for anxiety.   levofloxacin 500 MG tablet Commonly known as:  LEVAQUIN Take 1 tablet (500 mg total) by mouth daily.   LORazepam 1 MG tablet Commonly known as:  ATIVAN Take 1 mg by mouth 2 (two) times daily as needed for anxiety.   Magnesium 500 MG Caps Take 500 mg by mouth at bedtime.   mirtazapine 7.5 MG tablet Commonly known as:  REMERON Take 1 tablet (7.5 mg total) by mouth at bedtime.   multivitamin with minerals Tabs tablet Take 1 tablet by mouth daily.   nitroGLYCERIN 0.4 MG SL tablet Commonly known as:  NITROSTAT Place 1 tablet (0.4 mg total) under the tongue every 5 (five) minutes as needed for chest pain.   potassium chloride SA 20 MEQ tablet Commonly known as:  K-DUR,KLOR-CON Take 2 tablets (40 mEq total) by mouth daily.   tamsulosin 0.4 MG Caps capsule Commonly known as:  FLOMAX Take 0.4 mg by mouth daily.   thiamine 100 MG tablet Commonly known as:  VITAMIN B-1 Take 100 mg by mouth daily.       Immunizations: Immunization History  Administered Date(s) Administered  . Influenza,inj,Quad PF,36+ Mos 09/04/2014  . PPD Test 04/04/2016, 09/23/2016  .  Pneumococcal-Unspecified 07/01/2014     Physical Exam:  Vitals:   09/27/16 1448  BP: (!) 156/99  Pulse: 86  Resp: 20  Temp: 97.6 F (36.4 C)  TempSrc: Oral  SpO2: 98%  Weight: 180 lb (81.6 kg)  Height: 5\' 10"  (1.778 m)   Body mass index is 25.83 kg/m.  General- elderly Henson, well built, in no acute distress Head- normocephalic, atraumatic, has hearing aids Nose- no maxillary or frontal sinus tenderness, no nasal discharge Throat- moist mucus membrane, has dentures Eyes- PERRLA, EOMI, no pallor, no icterus, no discharge, normal conjunctiva, normal sclera Neck- no cervical lymphadenopathy Cardiovascular- normal s1,s2, no murmur, no leg edema Respiratory- bilateral clear to auscultation, no wheeze, no rhonchi, no crackles, no use of accessory muscles Abdomen- bowel sounds present, soft, non tender Musculoskeletal- able to move all 4 extremities, generalized weakness Neurological- alert and oriented x 3  Skin- warm and dry, bruising +, skin tear to right knee Psychiatry- normal mood and affect   Labs reviewed: Basic Metabolic Panel:  Recent Labs  09/17/16 1025  09/19/16 0310 09/20/16 0524 09/21/16 1342 09/22/16 0459 09/23/16 0546  NA  --   < > 137 138 137 138 138  K  --   < > 3.5 3.6 4.3 4.0 3.8  CL  --   < > 106 108 110 109 110  CO2  --   < > 21* 23 22 23 22   GLUCOSE  --   < > 105* 100* 107* 108* 94  BUN  --   < > 12 10 16 18 14   CREATININE  --   < > 0.67 0.73 0.68 0.80 0.72  CALCIUM  --   < > 8.8* 9.0 9.1 9.5 9.3  MG 1.7  --  1.5* 2.1  --   --   --   PHOS 2.8  --  2.9 3.6  --   --   --   < > = values in this interval not displayed. Liver Function Tests:  Recent Labs  09/13/16 1414 09/14/16 0401 09/18/16 0318  AST 116* 93* 42*  ALT 66* 58 35  ALKPHOS 92 88 69  BILITOT 1.7* 1.4* 0.9  PROT 6.7 6.5 6.1*  ALBUMIN 3.9 3.8 2.8*   No results for input(s): LIPASE, AMYLASE in the last 8760 hours.  Recent Labs  08/24/16 1855 09/13/16 1426 09/22/16 0459    AMMONIA 17 41* 20   CBC:  Recent Labs  05/20/16 08/24/16 1855 09/13/16 1414  09/18/16 0318 09/22/16 0459 09/23/16 0546  WBC 7.4 7.5 5.0  < > 4.9 4.4 4.0  NEUTROABS 4,440 4.9 3.3  --   --   --   --   HGB 12.2* 13.8 11.7*  < > 10.2* 10.2* 10.3*  HCT 36* 39.4 32.7*  < > 28.3* 30.1* 29.5*  MCV  --  94.0 95.6  < > 99.0 102.7* 98.7  PLT 392 213 151  < > 151 326 383  < > = values in this interval not displayed. Cardiac Enzymes:  Recent Labs  01/10/16 1712  01/11/16 0528 05/09/16 2305 05/10/16 0456  CKTOTAL 47*  --  82  --   --   CKMB 1.3  --   --   --   --   TROPONINI 4.59*  < > 4.46* 11.59* 11.Joshua*  < > = values in this interval not displayed. BNP: Invalid input(s): POCBNP CBG:  Recent Labs  05/09/16 2032 05/10/16 1156 05/11/16 1205  GLUCAP 104* 102* 139*    Radiological Exams: Dg Chest 1 View  Result Date: 09/20/2016 CLINICAL DATA:  History of fever, tachycardia, smoking history EXAM: CHEST 1 VIEW COMPARISON:  Chest x-ray of 05/09/2016 FINDINGS: Minimally prominent markings at the lung bases may well represent linear atelectasis, with pneumonia difficult to exclude particularly at the left lung base. No effusion is seen. The heart is mildly enlarged and stable. No bony abnormality is seen. A calcification in the left neck is noted on the frontal view probably vascular in origin. IMPRESSION: Slightly prominent markings at the lung bases left-greater-than-right may represent atelectasis, but pneumonia cannot be excluded. Recommend followup. Electronically Signed   By: Ivar Drape M.D.   On: 09/20/2016 10:16   Ct Head Wo Contrast  Result Date: 09/13/2016 CLINICAL DATA:  Frequent falls. History of alcohol abuse and Meniere's disease EXAM: CT HEAD WITHOUT CONTRAST TECHNIQUE: Contiguous axial images were obtained  from the base of the skull through the vertex without intravenous contrast. COMPARISON:  08/26/2016 FINDINGS: Brain: Mild superficial and moderate central atrophy with  sulcal and ventricular prominence unchanged in appearance. Chronic appearing small vessel ischemic disease characterized by. Ventricular and coronal radiata hypodensity is again noted. No large vessel territory infarction, acute intracranial mass, hemorrhage, edema or midline shift. Vascular: Atherosclerotic calcifications of the cavernous carotid arteries bilaterally. Skull: No acute osseous abnormality. Sinuses/Orbits: Stable chronic left anterior lobe calcifications unchanged in appearance. Chronic left lamina papyracea fracture deformity. The this retention cyst of the left maxillary sinus. Right maxillary sinus mucosal thickening with hypertrophic changes of the maxillary sinus walls consistent chronic sinusitis. Other: None IMPRESSION: 1. 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 Stable cerebral atrophy. 2. No acute intracranial abnormality. 3. Chronic fracture deformity of the left lamina papyracea. 4. Chronic right maxillary sinusitis. Left maxillary mucous retention cysts. Electronically Signed   By: Ashley Royalty M.D.   On: 09/13/2016 14:14   Dg Hips Bilat With Pelvis 3-4 Views  Result Date: 09/13/2016 CLINICAL DATA:  Multiple falls.  Bilateral hip pain. EXAM: DG HIP (WITH OR WITHOUT PELVIS) 3-4V BILAT COMPARISON:  08/24/2016 pelvic radiograph. FINDINGS: No fracture or diastasis in the pelvis. No hip fracture or dislocation. 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 Fiducial markers overlie the prostate. Atherosclerotic change in the internal iliac arteries. No suspicious focal osseous lesion. IMPRESSION: No fracture.  No hip dislocation. Electronically Signed   By: Ilona Sorrel M.D.   On: 09/13/2016 14:01    Assessment/Plan  Unsteady gait Will have patient work with PT/OT as tolerated to regain strength and restore function.  Fall precautions are in place.  Physical deconditioning Will have him work with physical therapy and occupational  therapy team to help with gait training and muscle strengthening exercises.fall precautions. Skin care. Encourage to be out of bed.   Acute cystitis Continue levaquin 500 mg daily until today. Hydration encouraged  Chronic alcohol use Encouraged to stop alcohol use post discharge. Continue thiamine and folic acid. Continue MVI.   Protein calorie malnutrition Continue mirtazapine. Get RD consult.   Anemia of chronic disease With liver cirrhosis and etoh use. Monitor cbc  Alcoholic liver cirrhosis Monitor LFT and for signs of encephalopathy  CAD chets pain free. Continue coreg and hydralazine with aspirin and statin  HTN Continue hydralazine 25 mg tid and coreg 25 mg bid  Hypomagnesemia Continue magnesium supplement  Hypokalemia Continue kcl, check bmp  Hyperlipidemia Continue atorvastatin  Anxiety Continue ativan 1 mg bid prn and monitor.     Goals of care: short term rehabilitation   Labs/tests ordered: cbc, cmp  Family/ staff Communication: reviewed care plan with patient and nursing supervisor    Blanchie Serve, MD Internal Medicine [place], [place] [address] Cell Phone (Monday-Friday 8 am - 5 pm): [phone] On Call: [phone] and follow prompts after 5 pm and on weekends Office Phone: [phone] Office Fax: [phone]

## 2016-10-03 LAB — CBC AND DIFFERENTIAL
HCT: 32 % — AB (ref 41–53)
Hemoglobin: 10.5 g/dL — AB (ref 13.5–17.5)
Neutrophils Absolute: 4 /uL
Platelets: 304 10*3/uL (ref 150–399)
WBC: 6.9 10^3/mL

## 2016-10-03 LAB — BASIC METABOLIC PANEL
BUN: 16 mg/dL (ref 4–21)
CREATININE: 0.8 mg/dL (ref 0.6–1.3)
GLUCOSE: 95 mg/dL
POTASSIUM: 4.2 mmol/L (ref 3.4–5.3)
SODIUM: 142 mmol/L (ref 137–147)

## 2016-10-03 LAB — HEPATIC FUNCTION PANEL
ALT: 26 U/L (ref 10–40)
AST: 26 U/L (ref 14–40)
Alkaline Phosphatase: 60 U/L (ref 25–125)
Bilirubin, Total: 0.3 mg/dL

## 2016-10-07 ENCOUNTER — Encounter: Payer: Self-pay | Admitting: Adult Health

## 2016-10-07 ENCOUNTER — Non-Acute Institutional Stay (SKILLED_NURSING_FACILITY): Payer: Medicare Other | Admitting: Adult Health

## 2016-10-07 DIAGNOSIS — D638 Anemia in other chronic diseases classified elsewhere: Secondary | ICD-10-CM

## 2016-10-07 DIAGNOSIS — R5381 Other malaise: Secondary | ICD-10-CM

## 2016-10-07 DIAGNOSIS — I10 Essential (primary) hypertension: Secondary | ICD-10-CM | POA: Diagnosis not present

## 2016-10-07 DIAGNOSIS — F10230 Alcohol dependence with withdrawal, uncomplicated: Secondary | ICD-10-CM | POA: Diagnosis not present

## 2016-10-07 DIAGNOSIS — I2510 Atherosclerotic heart disease of native coronary artery without angina pectoris: Secondary | ICD-10-CM

## 2016-10-07 DIAGNOSIS — F419 Anxiety disorder, unspecified: Secondary | ICD-10-CM

## 2016-10-07 DIAGNOSIS — N400 Enlarged prostate without lower urinary tract symptoms: Secondary | ICD-10-CM

## 2016-10-07 DIAGNOSIS — F32A Depression, unspecified: Secondary | ICD-10-CM

## 2016-10-07 DIAGNOSIS — E876 Hypokalemia: Secondary | ICD-10-CM

## 2016-10-07 DIAGNOSIS — E785 Hyperlipidemia, unspecified: Secondary | ICD-10-CM

## 2016-10-07 DIAGNOSIS — E43 Unspecified severe protein-calorie malnutrition: Secondary | ICD-10-CM

## 2016-10-07 DIAGNOSIS — F329 Major depressive disorder, single episode, unspecified: Secondary | ICD-10-CM | POA: Diagnosis not present

## 2016-10-07 DIAGNOSIS — F10930 Alcohol use, unspecified with withdrawal, uncomplicated: Secondary | ICD-10-CM

## 2016-10-07 NOTE — Progress Notes (Signed)
Patient ID: Joshua Henson, male   DOB: 1946-05-03, 70 y.o.   MRN: [id number]    DATE:  10/07/2016   MRN:  [id number]  BIRTHDAY: Jun 01, 1946  Facility:  [facility] Home Location:  [place] Room Number: [id number]  LEVEL OF CARE:  SNF (31)  Contact Information    Name Relation Home Work [place] Significant other Basye   [phone]       Code Status History    Date Active Date Inactive Code Status Order ID Comments User Context   09/13/2016  6:30 PM 09/23/2016  5:59 PM Full Code [id number]  Mariel Aloe, MD Inpatient   05/10/2016  1:49 AM 05/13/2016  4:51 PM Full Code [id number]  Toy Baker, MD ED   03/23/2016  4:48 PM 04/04/2016  4:36 PM Full Code [id number]  Cristal Ford, DO Inpatient   01/18/2016  9:25 AM 01/19/2016  9:46 PM Full Code [id number]  Cherene Altes, MD Inpatient   01/15/2016  3:37 PM 01/18/2016  9:25 AM Full Code [id number]  Burnell Blanks, MD Inpatient   01/10/2016  9:58 PM 01/15/2016  3:37 PM Full Code [id number]  Norval Morton, MD Inpatient   04/02/2015  7:29 PM 04/08/2015  7:38 PM Full Code [id number]  Deneise Lever, MD ED   12/09/2014 10:27 PM 12/11/2014 12:14 PM Full Code [id number]  Evelina Bucy, MD ED   12/09/2014  4:22 PM 12/09/2014 10:27 PM Full Code [id number]  Charlott Rakes, MD ED   09/03/2014  1:19 AM 09/12/2014  3:50 PM Full Code [id number]  Allie Bossier, MD ED   07/07/2014  4:31 PM 07/11/2014  5:39 PM Full Code [id number]  Cathlyn Parsons, PA-C Inpatient   07/02/2014  3:38 AM 07/07/2014  4:31 PM Full Code [id number]  Theressa Millard, MD Inpatient       Chief Complaint  Patient presents with  . Discharge Note    HISTORY OF PRESENT ILLNESS:  This is a 70 year old male who is for discharge home with Home health PT and OT.   She has a PMH of alcohol abuse, hepatic cirrhosis, stroke, CAD, hypertension, depression and anxiety. He has been admitted to [facility] on 09/23/16. He drinks 5 drinks or more of vodka daily. He had a fall in the setting of alcohol use. He was put on CIWA protocol requiring Ativan. Psychiatry was consulted for evaluation of depression. He was started on Remeron. He developed cough and fever. CXR showed atelectasis vs. LLL PNA on 10/03 so ATB was started. Levaquin will be continued till today, 09/26/16.   Patient was admitted to this facility for short-term rehabilitation after the patient's recent hospitalization.  Patient has completed SNF rehabilitation and therapy has cleared the patient for discharge.   PAST MEDICAL HISTORY:  Past Medical History:  Diagnosis Date  . Acute hepatic encephalopathy   . Alcoholic cirrhosis of liver without ascites ([place])   . Anxiety state   . CAD (coronary artery disease), native coronary artery 01/10/2016   3 vessel coronary calcification noted on CT scan 04/03/15    . Depression   . Fatty liver, alcoholic [id number]  . Hypertension   . Increased ammonia level   . Insomnia   . NSTEMI (non-ST elevated myocardial infarction) ([place])   . Prostate cancer ([place]) 2010   External beam radiation (urol - Risa Grill, XRT Valere Dross)  . Stroke ([place])   . Tachycardia  CURRENT MEDICATIONS: Reviewed  Patient's Medications  New Prescriptions   No medications on file  Previous Medications   ASPIRIN 81 MG EC TABLET    Take 1 tablet (81 mg total) by mouth daily.   ATORVASTATIN (LIPITOR) 40 MG TABLET    Take 1 tablet (40 mg total) by mouth daily at 6 PM.   CARVEDILOL (COREG) 25 MG TABLET    Take 1 tablet (25 mg total) by mouth 2 (two) times daily with a meal.   CHOLECALCIFEROL (VITAMIN D) 1000 UNITS TABLET    Take 1,000 Units by mouth 2 (two) times daily.    COENZYME Q10 (CO Q-10) 200 MG CAPS    Take 200 mg by mouth daily.   FOLIC ACID (FOLVITE) 1 MG TABLET    Take 1 tablet (1 mg total) by mouth daily.   HYDRALAZINE (APRESOLINE) 25 MG TABLET    Take 25 mg by mouth 3 (three) times daily.   HYDROXYZINE  (ATARAX/VISTARIL) 10 MG TABLET    Take 1 tablet (10 mg total) by mouth 3 (three) times daily as needed for anxiety.   LORAZEPAM (ATIVAN) 1 MG TABLET    Take 1 mg by mouth 2 (two) times daily as needed for anxiety.   MAGNESIUM 500 MG CAPS    Take 500 mg by mouth at bedtime.   MIRTAZAPINE (REMERON) 7.5 MG TABLET    Take 1 tablet (7.5 mg total) by mouth at bedtime.   MULTIPLE VITAMIN (MULTIVITAMIN WITH MINERALS) TABS TABLET    Take 1 tablet by mouth daily.   NITROGLYCERIN (NITROSTAT) 0.4 MG SL TABLET    Place 1 tablet (0.4 mg total) under the tongue every 5 (five) minutes as needed for chest pain.   NUTRITIONAL SUPPLEMENTS (NUTRITIONAL DRINK PO)    Take 90 mLs by mouth 2 (two) times daily. MedPass 2.0   OMEGA-3 FATTY ACIDS (FISH OIL) 1000 MG CAPS    Take 1,000 mg by mouth 2 (two) times daily.    POTASSIUM CHLORIDE SA (K-DUR,KLOR-CON) 20 MEQ TABLET    Take 2 tablets (40 mEq total) by mouth daily.   TAMSULOSIN (FLOMAX) 0.4 MG CAPS CAPSULE    Take 0.4 mg by mouth daily.    THIAMINE (VITAMIN B-1) 100 MG TABLET    Take 100 mg by mouth daily.  Modified Medications   No medications on file  Discontinued Medications   No medications on file     Allergies  Allergen Reactions  . Lisinopril     syncope  . Celebrex [Celecoxib] Rash     REVIEW OF SYSTEMS:  GENERAL: no change in appetite, no fatigue, no weight changes, no fever, chills or weakness EYES: Denies change in vision, dry eyes, eye pain, itching or discharge EARS: Denies change in hearing, ringing in ears, or earache NOSE: Denies nasal congestion or epistaxis MOUTH and THROAT: Denies oral discomfort, gingival pain or bleeding, pain from teeth or hoarseness   RESPIRATORY: no cough, SOB, DOE, wheezing, hemoptysis CARDIAC: no chest pain, edema or palpitations GI: no abdominal pain, diarrhea, constipation, heart burn, nausea or vomiting GU: Denies dysuria, frequency, hematuria, incontinence, or discharge PSYCHIATRIC: Denies feeling of  depression or anxiety. No report of hallucinations, insomnia, paranoia, or agitation     PHYSICAL EXAMINATION  GENERAL APPEARANCE: Well nourished. In no acute distress. Normal body habitus SKIN:  Scabbed wound on right knee HEAD: Normal in size and contour. No evidence of trauma EYES: Lids open and close normally. No blepharitis, entropion or ectropion. PERRL. Conjunctivae are  clear and sclerae are white. Lenses are without opacity EARS: Pinnae are normal. Patient hears normal voice tunes of the examiner MOUTH and THROAT: Lips are without lesions. Oral mucosa is moist and without lesions. Tongue is normal in shape, size, and color and without lesions NECK: supple, trachea midline, no neck masses, no thyroid tenderness, no thyromegaly LYMPHATICS: no LAN in the neck, no supraclavicular LAN RESPIRATORY: breathing is even & unlabored, BS CTAB CARDIAC: RRR, no murmur,no extra heart sounds, no edema GI: abdomen soft, normal BS, no masses, no tenderness, no hepatomegaly, no splenomegaly EXTREMITIES:  Able to move X 4 extremities PSYCHIATRIC: Alert and oriented X 3. Affect and behavior are appropriate  LABS/RADIOLOGY: Labs reviewed: Basic Metabolic Panel:  Recent Labs  09/17/16 1025  09/19/16 0310 09/20/16 0524 09/21/16 1342 09/22/16 0459 09/23/16 0546 10/03/16  NA  --   < > 137 138 137 138 138 142  K  --   < > 3.5 3.6 4.3 4.0 3.8 4.2  CL  --   < > 106 108 110 109 110  --   CO2  --   < > 21* 23 22 23 22   --   GLUCOSE  --   < > 105* 100* 107* 108* 94  --   BUN  --   < > 12 10 16 18 14 16   CREATININE  --   < > 0.67 0.73 0.68 0.80 0.72 0.8  CALCIUM  --   < > 8.8* 9.0 9.1 9.5 9.3  --   MG 1.7  --  1.5* 2.1  --   --   --   --   PHOS 2.8  --  2.9 3.6  --   --   --   --   < > = values in this interval not displayed. Liver Function Tests:  Recent Labs  09/13/16 1414 09/14/16 0401 09/18/16 0318 10/03/16  AST 116* 93* 42* 26  ALT 66* 58 35 26  ALKPHOS 92 88 69 60  BILITOT 1.7*  1.4* 0.9  --   PROT 6.7 6.5 6.1*  --   ALBUMIN 3.9 3.8 2.8*  --     Recent Labs  08/24/16 1855 09/13/16 1426 09/22/16 0459  AMMONIA 17 41* 20   CBC:  Recent Labs  08/24/16 1855 09/13/16 1414  09/18/16 0318 09/22/16 0459 09/23/16 0546 10/03/16  WBC 7.5 5.0  < > 4.9 4.4 4.0 6.9  NEUTROABS 4.9 3.3  --   --   --   --  4  HGB 13.8 11.7*  < > 10.2* 10.2* 10.3* 10.5*  HCT 39.4 32.7*  < > 28.3* 30.1* 29.5* 32*  MCV 94.0 95.6  < > 99.0 102.7* 98.7  --   PLT 213 151  < > 151 326 383 304  < > = values in this interval not displayed.  Lipid Panel:  Recent Labs  05/18/16  HDL 38   Cardiac Enzymes:  Recent Labs  01/10/16 1712  01/11/16 0528 05/09/16 2305 05/10/16 0456  CKTOTAL 47*  --  82  --   --   CKMB 1.3  --   --   --   --   TROPONINI 4.59*  < > 4.46* 11.59* 11.85*  < > = values in this interval not displayed.  CBG:  Recent Labs  05/09/16 2032 05/10/16 1156 05/11/16 1205  GLUCAP 104* 102* 139*      Dg Chest 1 View  Result Date: 09/20/2016 CLINICAL DATA:  History of fever, tachycardia,  smoking history EXAM: CHEST 1 VIEW COMPARISON:  Chest x-ray of 05/09/2016 FINDINGS: Minimally prominent markings at the lung bases may well represent linear atelectasis, with pneumonia difficult to exclude particularly at the left lung base. No effusion is seen. The heart is mildly enlarged and stable. No bony abnormality is seen. A calcification in the left neck is noted on the frontal view probably vascular in origin. IMPRESSION: Slightly prominent markings at the lung bases left-greater-than-right may represent atelectasis, but pneumonia cannot be excluded. Recommend followup. Electronically Signed   By: Ivar Drape M.D.   On: 09/20/2016 10:16   Ct Head Wo Contrast  Result Date: 09/13/2016 CLINICAL DATA:  Frequent falls. History of alcohol abuse and Meniere's disease EXAM: CT HEAD WITHOUT CONTRAST TECHNIQUE: Contiguous axial images were obtained from the base of the skull  through the vertex without intravenous contrast. COMPARISON:  08/26/2016 FINDINGS: Brain: Mild superficial and moderate central atrophy with sulcal and ventricular prominence unchanged in appearance. Chronic appearing small vessel ischemic disease characterized by. Ventricular and coronal radiata hypodensity is again noted. No large vessel territory infarction, acute intracranial mass, hemorrhage, edema or midline shift. Vascular: Atherosclerotic calcifications of the cavernous carotid arteries bilaterally. Skull: No acute osseous abnormality. Sinuses/Orbits: Stable chronic left anterior lobe calcifications unchanged in appearance. Chronic left lamina papyracea fracture deformity. The this retention cyst of the left maxillary sinus. Right maxillary sinus mucosal thickening with hypertrophic changes of the maxillary sinus walls consistent chronic sinusitis. Other: None IMPRESSION: 1. 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 Stable cerebral atrophy. 2. No acute intracranial abnormality. 3. Chronic fracture deformity of the left lamina papyracea. 4. Chronic right maxillary sinusitis. Left maxillary mucous retention cysts. Electronically Signed   By: Ashley Royalty M.D.   On: 09/13/2016 14:14   Dg Hips Bilat With Pelvis 3-4 Views  Result Date: 09/13/2016 CLINICAL DATA:  Multiple falls.  Bilateral hip pain. EXAM: DG HIP (WITH OR WITHOUT PELVIS) 3-4V BILAT COMPARISON:  08/24/2016 pelvic radiograph. FINDINGS: No fracture or diastasis in the pelvis. No hip fracture or dislocation. 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 Fiducial markers overlie the prostate. Atherosclerotic change in the internal iliac arteries. No suspicious focal osseous lesion. IMPRESSION: No fracture.  No hip dislocation. Electronically Signed   By: Ilona Sorrel M.D.   On: 09/13/2016 14:01    ASSESSMENT/PLAN:  Physical deconditioning - for rehabilitation, PT and OT, for therapeutic  strengthening exercises; fall precaution  HCAP - S/P Levaquin course; resolved  Alcohol withdrawal - continue Ativan 1 mg 1 tab PO BID PRN, Folic acid 1 mg 1 tab PO Q D  Depression - continue Mirtazepine 7.5 mg 1 tab PO Q HS  Essential hypertension - continue Coreg 25 mg 1 tab PO BID  Protein-calorie malnutrition, severe - albumin 2.8; continue Med pass 90 ml PO BID  Hypokalemia - continue KCL ER 20 meq give 2 tabs = 40 meq PO Q D Lab Results  Component Value Date   K 4.2 10/03/2016   BPH - continue Flomax 0.4 mg 1 capsule PO Q D  Hyperlipidemia - continue Lipitor 40 mg 1 tab PO Q D Lab Results  Component Value Date   CHOL 101 05/18/2016   HDL 38 05/18/2016   LDLCALC 50 05/18/2016   TRIG 70 05/18/2016   CHOLHDL 2.1 07/04/2014    Anxiety - mood is stable; continue Hydroxyzine 25 mg 1 tab PO TID PRN  CAD - no chest pain; continue ASA 81 mg 1  tab PO daily,  NTG PRN  Hypomagnesemia - continue Magnesium 500 mg 1 tab PO Q HS  Anemia of chronic disease - stable Lab Results  Component Value Date   HGB 10.5 (A) 10/03/2016        I have filled out patient's discharge paperwork and written prescriptions.  Patient will receive home health PT and OT.   DME provided:  None  Total discharge time: Less than 30 minutes  Discharge time involved coordination of the discharge process with social worker, nursing staff and therapy department. Medical justification for home health services verified.     Durenda Age, NP [employer] [phone]

## 2016-10-21 ENCOUNTER — Emergency Department (HOSPITAL_COMMUNITY): Payer: Medicare Other

## 2016-10-21 ENCOUNTER — Encounter (HOSPITAL_COMMUNITY): Payer: Self-pay

## 2016-10-21 ENCOUNTER — Emergency Department (HOSPITAL_COMMUNITY)
Admission: EM | Admit: 2016-10-21 | Discharge: 2016-10-21 | Disposition: A | Discharge status: Home / Self Care | Payer: Medicare Other | Attending: Emergency Medicine | Admitting: Emergency Medicine

## 2016-10-21 DIAGNOSIS — Z8546 Personal history of malignant neoplasm of prostate: Secondary | ICD-10-CM | POA: Insufficient documentation

## 2016-10-21 DIAGNOSIS — I252 Old myocardial infarction: Secondary | ICD-10-CM | POA: Insufficient documentation

## 2016-10-21 DIAGNOSIS — Z8673 Personal history of transient ischemic attack (TIA), and cerebral infarction without residual deficits: Secondary | ICD-10-CM | POA: Diagnosis not present

## 2016-10-21 DIAGNOSIS — R220 Localized swelling, mass and lump, head: Secondary | ICD-10-CM | POA: Diagnosis not present

## 2016-10-21 DIAGNOSIS — I10 Essential (primary) hypertension: Secondary | ICD-10-CM | POA: Diagnosis not present

## 2016-10-21 DIAGNOSIS — Y92009 Unspecified place in unspecified non-institutional (private) residence as the place of occurrence of the external cause: Secondary | ICD-10-CM

## 2016-10-21 DIAGNOSIS — I2510 Atherosclerotic heart disease of native coronary artery without angina pectoris: Secondary | ICD-10-CM | POA: Insufficient documentation

## 2016-10-21 DIAGNOSIS — Z7982 Long term (current) use of aspirin: Secondary | ICD-10-CM | POA: Insufficient documentation

## 2016-10-21 DIAGNOSIS — D649 Anemia, unspecified: Secondary | ICD-10-CM

## 2016-10-21 DIAGNOSIS — F17210 Nicotine dependence, cigarettes, uncomplicated: Secondary | ICD-10-CM | POA: Diagnosis not present

## 2016-10-21 DIAGNOSIS — R4182 Altered mental status, unspecified: Secondary | ICD-10-CM | POA: Diagnosis not present

## 2016-10-21 DIAGNOSIS — Z0001 Encounter for general adult medical examination with abnormal findings: Secondary | ICD-10-CM | POA: Diagnosis present

## 2016-10-21 DIAGNOSIS — W19XXXA Unspecified fall, initial encounter: Secondary | ICD-10-CM

## 2016-10-21 LAB — URINALYSIS, ROUTINE W REFLEX MICROSCOPIC
BILIRUBIN URINE: NEGATIVE
Glucose, UA: NEGATIVE mg/dL
Hgb urine dipstick: NEGATIVE
KETONES UR: NEGATIVE mg/dL
Leukocytes, UA: NEGATIVE
NITRITE: NEGATIVE
PROTEIN: NEGATIVE mg/dL
SPECIFIC GRAVITY, URINE: 1.024 (ref 1.005–1.030)
pH: 6.5 (ref 5.0–8.0)

## 2016-10-21 LAB — ETHANOL

## 2016-10-21 LAB — CBC WITH DIFFERENTIAL/PLATELET
BASOS ABS: 0 10*3/uL (ref 0.0–0.1)
BASOS PCT: 0 %
EOS ABS: 0.2 10*3/uL (ref 0.0–0.7)
EOS PCT: 2 %
HCT: 35.2 % — ABNORMAL LOW (ref 39.0–52.0)
Hemoglobin: 12 g/dL — ABNORMAL LOW (ref 13.0–17.0)
Lymphocytes Relative: 21 %
Lymphs Abs: 1.9 10*3/uL (ref 0.7–4.0)
MCH: 33.5 pg (ref 26.0–34.0)
MCHC: 34.1 g/dL (ref 30.0–36.0)
MCV: 98.3 fL (ref 78.0–100.0)
MONO ABS: 0.9 10*3/uL (ref 0.1–1.0)
Monocytes Relative: 10 %
Neutro Abs: 6.2 10*3/uL (ref 1.7–7.7)
Neutrophils Relative %: 67 %
PLATELETS: 229 10*3/uL (ref 150–400)
RBC: 3.58 MIL/uL — AB (ref 4.22–5.81)
RDW: 15.5 % (ref 11.5–15.5)
WBC: 9.2 10*3/uL (ref 4.0–10.5)

## 2016-10-21 LAB — COMPREHENSIVE METABOLIC PANEL
ALBUMIN: 3.8 g/dL (ref 3.5–5.0)
ALT: 18 U/L (ref 17–63)
AST: 20 U/L (ref 15–41)
Alkaline Phosphatase: 52 U/L (ref 38–126)
Anion gap: 7 (ref 5–15)
BILIRUBIN TOTAL: 0.8 mg/dL (ref 0.3–1.2)
BUN: 16 mg/dL (ref 6–20)
CO2: 21 mmol/L — AB (ref 22–32)
Calcium: 9.2 mg/dL (ref 8.9–10.3)
Chloride: 108 mmol/L (ref 101–111)
Creatinine, Ser: 0.75 mg/dL (ref 0.61–1.24)
GFR calc Af Amer: >60 mL/min (ref >60)
GFR calc non Af Amer: >60 mL/min (ref >60)
GLUCOSE: 102 mg/dL — AB (ref 65–99)
POTASSIUM: 3.7 mmol/L (ref 3.5–5.1)
Sodium: 136 mmol/L (ref 135–145)
TOTAL PROTEIN: 6.8 g/dL (ref 6.5–8.1)

## 2016-10-21 LAB — RAPID URINE DRUG SCREEN, HOSP PERFORMED
AMPHETAMINES: NOT DETECTED
BARBITURATES: NOT DETECTED
Benzodiazepines: POSITIVE — AB
Cocaine: NOT DETECTED
Opiates: NOT DETECTED
TETRAHYDROCANNABINOL: POSITIVE — AB

## 2016-10-21 LAB — PROTIME-INR
INR: 1.08
Prothrombin Time: 14 seconds (ref 11.4–15.2)

## 2016-10-21 LAB — AMMONIA: AMMONIA: 11 umol/L (ref 9–35)

## 2016-10-21 NOTE — Progress Notes (Addendum)
[facility] ED Cm received a call from Cleora Fleet home health coordinator about pt coming to [place]  Pt needing medical clearance Pt is scheduled for placement at [facility] place "Has a bed at [facility] place for Monday"  Tim states [place] assisted pt with placement to [facility] but pt noted to confused, needing  Transitional plan: to [facility] ED for assessment to determine medical stability (assist to determine the source of his confusion) to assist with admission to [facility]  If pt medically stable the wife is willing to take pt home and to [place] place on Monday.  Jolyne Loa [phone]), admissions director [facility] can assist with coordinating this process ED Cm spoke with ED assistant director, Anderson Malta, charge RN Erline Levine and triage RN Maylon Cos ED PM CM updated

## 2016-10-21 NOTE — ED Notes (Signed)
Bed: [facility] Expected date:  Expected time:  Means of arrival:  Comments: 

## 2016-10-21 NOTE — ED Notes (Signed)
Joshua Henson [phone]

## 2016-10-21 NOTE — Discharge Instructions (Signed)
Go to [facility] as scheduled. Your ammonia level and liver tests are all normal.

## 2016-10-21 NOTE — ED Triage Notes (Addendum)
Pt sent here by [facility] to get blood work- specifically ammonia so that pt could be cleared to go back to [facility]. Yesterday, visitor reports that pt was mentally altered, but his symptoms have resolved today. They were told to speak to Jackelyn Poling. A&Ox4. Ambulatory.

## 2016-10-21 NOTE — ED Notes (Signed)
After speaking with case management, pt was sent here to be medically cleared to go back to a facility.

## 2016-10-21 NOTE — ED Notes (Signed)
RN attempted to stick pt two times and was only able to get a small amount of blood before line blew. Phlebotomy attempting now.

## 2016-10-21 NOTE — ED Provider Notes (Signed)
[facility] DEPT Provider Note   CSN: [id number] Arrival date & time: 10/21/16  1556     History   Chief Complaint Chief Complaint  Patient presents with  . Medical Clearance    HPI Joshua Henson is a 71 y.o. male.  He was sent here for medical clearance to go to [facility]. He does have history of hepatic encephalopathy and there was concern that his ammonia level might be elevated. Patient states that he had fallen 6 days ago and had been drinking at that time. He also states she might of had something to drink 4 days ago but states she's been abstinent since then and denies any drug use. He fell 2 days ago. After each of these falls, he states that he was "fuzzy" for about 1-2 days but he feels he is back to his baseline. Note from [facility] states that family member that he was mentally altered yesterday but better today. He was sent here for medical clearance including ammonia level. Past history includes polysubstance abuse, alcoholic cirrhosis, hepatic encephalopathy, hypertension, stroke, coronary artery disease with non-STEMI.   The history is provided by the patient and a relative.    Past Medical History:  Diagnosis Date  . Acute hepatic encephalopathy   . Alcoholic cirrhosis of liver without ascites ([place])   . Anxiety state   . CAD (coronary artery disease), native coronary artery 01/10/2016   3 vessel coronary calcification noted on CT scan 04/03/15    . Depression   . Fatty liver, alcoholic [id number]  . Hypertension   . Increased ammonia level   . Insomnia   . NSTEMI (non-ST elevated myocardial infarction) ([place])   . Prostate cancer ([place]) 2010   External beam radiation (urol - Risa Grill, XRT Valere Dross)  . Stroke ([place])   . Tachycardia     Patient Active Problem List   Diagnosis Date Noted  . Fever   . Alcohol withdrawal delirium ([place]) 09/13/2016  . Left upper quadrant pain 05/10/2016  . Encephalopathy 05/09/2016  . C. difficile colitis 03/30/2016  .  Acute kidney injury ([place]) 03/30/2016  . Essential hypertension 03/23/2016  . Fall   . Tobacco abuse   . Hypokalemia   . Hypomagnesemia   . Hypophosphatemia   . CAP (community acquired pneumonia) due to MSSA (methicillin sensitive Staphylococcus aureus) ([place])   . CAD (coronary artery disease)   . Cocaine abuse   . Alcohol abuse with intoxication ([place]) 01/11/2016  . Frequent falls 01/11/2016  . Left rib fracture 01/11/2016  . Acute encephalopathy 01/11/2016  . Polysubstance abuse 01/11/2016  . Delirium tremens ([place]) 01/11/2016  . NSTEMI (non-ST elevated myocardial infarction) ([place]) 01/11/2016  . Sepsis due to Enterococcus with acute renal failure and metabolic encephalopathy ([place]) 01/11/2016  . Alcohol intoxication ([place])   . Chronic alcohol abuse   . Acute respiratory failure with hypoxia ([place])   . Elevated troponin 01/10/2016  . Chest pain 01/10/2016  . CAD (coronary artery disease), native coronary artery 01/10/2016  . Fatty liver, alcoholic [id number]  . Tremor 04/02/2015  . Ataxia   . Alcohol withdrawal, with delirium ([place]) 09/03/2014  . Unspecified cerebral artery occlusion with cerebral infarction 07/05/2014  . Alcohol abuse 07/02/2014  . Current tobacco use 01/17/2014  . Hearing loss 11/25/2013  . Tinnitus of both ears 11/25/2013    Past Surgical History:  Procedure Laterality Date  . CARDIAC CATHETERIZATION N/A 01/15/2016   Procedure: Left Heart Cath and Coronary Angiography;  Surgeon: Annita Brod  Angelena Form, MD;  Location: [facility];  Service: Cardiovascular;  Laterality: N/A;       Home Medications    Prior to Admission medications   Medication Sig Start Date End Date Taking? Authorizing Provider  aspirin 81 MG EC tablet Take 1 tablet (81 mg total) by mouth daily. 07/11/14   Lavon Paganini Angiulli, PA-C  atorvastatin (LIPITOR) 40 MG tablet Take 1 tablet (40 mg total) by mouth daily at 6 PM. 01/19/16   Allie Bossier, MD  carvedilol (COREG) 25 MG tablet Take  1 tablet (25 mg total) by mouth 2 (two) times daily with a meal. 09/23/16   Patrecia Pour, MD  cholecalciferol (VITAMIN D) 1000 units tablet Take 1,000 Units by mouth 2 (two) times daily.     Historical Provider, MD  Coenzyme Q10 (CO Q-10) 200 MG CAPS Take 200 mg by mouth daily.    Historical Provider, MD  folic acid (FOLVITE) 1 MG tablet Take 1 tablet (1 mg total) by mouth daily. 07/11/14   Lavon Paganini Angiulli, PA-C  hydrALAZINE (APRESOLINE) 25 MG tablet Take 25 mg by mouth 3 (three) times daily.    Historical Provider, MD  hydrOXYzine (ATARAX/VISTARIL) 10 MG tablet Take 1 tablet (10 mg total) by mouth 3 (three) times daily as needed for anxiety. 05/13/16   Velvet Bathe, MD  LORazepam (ATIVAN) 1 MG tablet Take 1 mg by mouth 2 (two) times daily as needed for anxiety.    Historical Provider, MD  Magnesium 500 MG CAPS Take 500 mg by mouth at bedtime.    Historical Provider, MD  mirtazapine (REMERON) 7.5 MG tablet Take 1 tablet (7.5 mg total) by mouth at bedtime. 09/23/16   Patrecia Pour, MD  Multiple Vitamin (MULTIVITAMIN WITH MINERALS) TABS tablet Take 1 tablet by mouth daily. 05/13/16   Velvet Bathe, MD  nitroGLYCERIN (NITROSTAT) 0.4 MG SL tablet Place 1 tablet (0.4 mg total) under the tongue every 5 (five) minutes as needed for chest pain. 05/13/16   Velvet Bathe, MD  Nutritional Supplements (NUTRITIONAL DRINK PO) Take 90 mLs by mouth 2 (two) times daily. MedPass 2.0 10/01/16   Historical Provider, MD  Omega-3 Fatty Acids (FISH OIL) 1000 MG CAPS Take 1,000 mg by mouth 2 (two) times daily.     Historical Provider, MD  potassium chloride SA (K-DUR,KLOR-CON) 20 MEQ tablet Take 2 tablets (40 mEq total) by mouth daily. 09/24/16   Patrecia Pour, MD  tamsulosin (FLOMAX) 0.4 MG CAPS capsule Take 0.4 mg by mouth daily.  11/25/14   Historical Provider, MD  thiamine (VITAMIN B-1) 100 MG tablet Take 100 mg by mouth daily.    Historical Provider, MD    Family History Family History  Problem Relation Age of Onset  .  Cancer Mother     esophageal  . Hypertension Mother   . Diabetes Mother   . Cancer Father     prostate  . Heart disease Maternal Grandfather     MI  . Cancer Paternal Grandfather     prostate    Social History Social History  Substance Use Topics  . Smoking status: Current Every Day Smoker    Packs/day: 0.50    Years: 30.00    Types: Cigarettes  . Smokeless tobacco: Never Used  . Alcohol use 33.6 oz/week    56 Shots of liquor per week     Comment: last drink yesterday     Allergies   Lisinopril and Celebrex [celecoxib]   Review of  Systems Review of Systems  All other systems reviewed and are negative.    Physical Exam Updated Vital Signs BP 137/91 (BP Location: Left Arm)   Pulse 86   Temp 98 F (36.7 C) (Oral)   Resp 18   SpO2 94%   Physical Exam  Nursing note and vitals reviewed.  70 year old male, Awake and alert, resting comfortably and in no acute distress. Vital signs are normal. Oxygen saturation is 94%, which is normal. Head is normocephalic. Minor abrasion is noted over the bridge of the nose without any swelling. PERRLA, EOMI. Oropharynx is clear. Neck is nontender and supple without adenopathy or JVD. Back is nontender and there is no CVA tenderness. Lungs are clear without rales, wheezes, or rhonchi. Chest is nontender. Heart has regular rate and rhythm without murmur. Abdomen is soft, flat, nontender without masses or hepatosplenomegaly and peristalsis is normoactive. Extremities have no cyanosis or edema, full range of motion is present. Skin is warm and dry without rash. Neurologic: Mental status is normal, cranial nerves are intact, there are no motor or sensory deficits.  ED Treatments / Results  Labs (all labs ordered are listed, but only abnormal results are displayed) Labs Reviewed  COMPREHENSIVE METABOLIC PANEL - Abnormal; Notable for the following:       Result Value   CO2 21 (*)    Glucose, Bld 102 (*)    All other components  within normal limits  RAPID URINE DRUG SCREEN, HOSP PERFORMED - Abnormal; Notable for the following:    Benzodiazepines POSITIVE (*)    Tetrahydrocannabinol POSITIVE (*)    All other components within normal limits  CBC WITH DIFFERENTIAL/PLATELET - Abnormal; Notable for the following:    RBC 3.58 (*)    Hemoglobin 12.0 (*)    HCT 35.2 (*)    All other components within normal limits  URINALYSIS, ROUTINE W REFLEX MICROSCOPIC (NOT AT [facility])  ETHANOL  AMMONIA  PROTIME-INR    Radiology Ct Head Wo Contrast  Result Date: 10/21/2016 CLINICAL DATA:  Fall, altered mental status EXAM: CT HEAD WITHOUT CONTRAST TECHNIQUE: Contiguous axial images were obtained from the base of the skull through the vertex without intravenous contrast. COMPARISON:  09/13/2016 FINDINGS: Brain: No evidence of acute infarction, hemorrhage, hydrocephalus, extra-axial collection or mass lesion/mass effect. Vascular: No hyperdense vessel or unexpected calcification. Skull: Mild soft tissue swelling overlying the right frontal bone (series 2/ image 12). No evidence of calvarial fracture. Sinuses/Orbits: Partial opacification of the bilateral maxillary sinuses, left greater than right. Mastoid air cells are clear. Other: Mild subcortical white matter and periventricular small vessel ischemic changes. Intracranial atherosclerosis. IMPRESSION: Mild soft tissue swelling overlying the right frontal bone. No evidence of calvarial fracture. No evidence of acute intracranial abnormality. Electronically Signed   By: Julian Hy M.D.   On: 10/21/2016 17:29    Procedures Procedures (including critical care time)  Medications Ordered in ED Medications - No data to display   Initial Impression / Assessment and Plan / ED Course  I have reviewed the triage vital signs and the nursing notes.  Pertinent labs & imaging results that were available during my care of the patient were reviewed by me and considered in my medical decision  making (see chart for details).  Clinical Course   2 falls in the last week with reports of mental status change following falls. Clinically, I doubt this is due to hepatic encephalopathy since he improved spontaneously. However, will get screening labs including ammonia. We'll also  send for CT of head to make sure he did not have intracranial injury. Old records are reviewed confirming hospital admissions for alcohol abuse, hepatic encephalopathy.  ED workup is unremarkable including CT of head showing no acute changes. Ammonia level is 11 and unlikely to be related to any mental status changes. Family member has arrived and states that he has had mental status changes that do not seem to be related to the fall. He has mild anemia which is unchanged from baseline. It is noted that he takes lorazepam on an as-needed basis. This should probably be stopped.  Final Clinical Impressions(s) / ED Diagnoses   Final diagnoses:  Fall at home, initial encounter  Normochromic normocytic anemia    New Prescriptions New Prescriptions   No medications on file     Delora Fuel, MD [id number]

## 2016-10-21 NOTE — ED Notes (Signed)
ED Provider at bedside. 

## 2016-10-25 DIAGNOSIS — H401222 Low-tension glaucoma, left eye, moderate stage: Secondary | ICD-10-CM | POA: Diagnosis not present

## 2016-10-25 DIAGNOSIS — H40001 Preglaucoma, unspecified, right eye: Secondary | ICD-10-CM | POA: Diagnosis not present

## 2016-11-04 ENCOUNTER — Other Ambulatory Visit: Payer: Self-pay | Admitting: Adult Health

## 2016-11-30 ENCOUNTER — Other Ambulatory Visit: Payer: Self-pay | Admitting: Adult Health

## 2017-01-11 ENCOUNTER — Other Ambulatory Visit: Payer: Self-pay | Admitting: Adult Health

## 2017-01-13 ENCOUNTER — Other Ambulatory Visit: Payer: Self-pay | Admitting: Adult Health

## 2017-02-14 ENCOUNTER — Inpatient Hospital Stay (HOSPITAL_COMMUNITY)
Admission: EM | Admit: 2017-02-14 | Discharge: 2017-02-23 | DRG: 438 | Disposition: A | Discharge status: Skilled Nursing Facility | Payer: Medicare Other | Attending: Internal Medicine | Admitting: Internal Medicine

## 2017-02-14 DIAGNOSIS — K852 Alcohol induced acute pancreatitis without necrosis or infection: Principal | ICD-10-CM | POA: Diagnosis present

## 2017-02-14 DIAGNOSIS — I252 Old myocardial infarction: Secondary | ICD-10-CM

## 2017-02-14 DIAGNOSIS — Z800 Family history of malignant neoplasm of digestive organs: Secondary | ICD-10-CM

## 2017-02-14 DIAGNOSIS — W06XXXA Fall from bed, initial encounter: Secondary | ICD-10-CM | POA: Diagnosis present

## 2017-02-14 DIAGNOSIS — E876 Hypokalemia: Secondary | ICD-10-CM | POA: Diagnosis present

## 2017-02-14 DIAGNOSIS — T50906A Underdosing of unspecified drugs, medicaments and biological substances, initial encounter: Secondary | ICD-10-CM | POA: Diagnosis present

## 2017-02-14 DIAGNOSIS — W010XXA Fall on same level from slipping, tripping and stumbling without subsequent striking against object, initial encounter: Secondary | ICD-10-CM | POA: Diagnosis present

## 2017-02-14 DIAGNOSIS — Z8249 Family history of ischemic heart disease and other diseases of the circulatory system: Secondary | ICD-10-CM

## 2017-02-14 DIAGNOSIS — I10 Essential (primary) hypertension: Secondary | ICD-10-CM | POA: Diagnosis present

## 2017-02-14 DIAGNOSIS — Z8546 Personal history of malignant neoplasm of prostate: Secondary | ICD-10-CM

## 2017-02-14 DIAGNOSIS — S2232XA Fracture of one rib, left side, initial encounter for closed fracture: Secondary | ICD-10-CM | POA: Diagnosis present

## 2017-02-14 DIAGNOSIS — Z91138 Patient's unintentional underdosing of medication regimen for other reason: Secondary | ICD-10-CM

## 2017-02-14 DIAGNOSIS — Z8042 Family history of malignant neoplasm of prostate: Secondary | ICD-10-CM

## 2017-02-14 DIAGNOSIS — Y92003 Bedroom of unspecified non-institutional (private) residence as the place of occurrence of the external cause: Secondary | ICD-10-CM

## 2017-02-14 DIAGNOSIS — Y92002 Bathroom of unspecified non-institutional (private) residence single-family (private) house as the place of occurrence of the external cause: Secondary | ICD-10-CM

## 2017-02-14 DIAGNOSIS — F411 Generalized anxiety disorder: Secondary | ICD-10-CM | POA: Diagnosis present

## 2017-02-14 DIAGNOSIS — F10231 Alcohol dependence with withdrawal delirium: Secondary | ICD-10-CM | POA: Diagnosis present

## 2017-02-14 DIAGNOSIS — F1010 Alcohol abuse, uncomplicated: Secondary | ICD-10-CM | POA: Diagnosis present

## 2017-02-14 DIAGNOSIS — F329 Major depressive disorder, single episode, unspecified: Secondary | ICD-10-CM | POA: Diagnosis present

## 2017-02-14 DIAGNOSIS — J690 Pneumonitis due to inhalation of food and vomit: Secondary | ICD-10-CM

## 2017-02-14 DIAGNOSIS — F1029 Alcohol dependence with unspecified alcohol-induced disorder: Secondary | ICD-10-CM

## 2017-02-14 DIAGNOSIS — Z888 Allergy status to other drugs, medicaments and biological substances status: Secondary | ICD-10-CM

## 2017-02-14 DIAGNOSIS — A419 Sepsis, unspecified organism: Secondary | ICD-10-CM | POA: Diagnosis not present

## 2017-02-14 DIAGNOSIS — I712 Thoracic aortic aneurysm, without rupture: Secondary | ICD-10-CM | POA: Diagnosis present

## 2017-02-14 DIAGNOSIS — K7030 Alcoholic cirrhosis of liver without ascites: Secondary | ICD-10-CM | POA: Diagnosis present

## 2017-02-14 DIAGNOSIS — R042 Hemoptysis: Secondary | ICD-10-CM | POA: Diagnosis present

## 2017-02-14 DIAGNOSIS — F10931 Alcohol use, unspecified with withdrawal delirium: Secondary | ICD-10-CM | POA: Diagnosis present

## 2017-02-14 DIAGNOSIS — I2510 Atherosclerotic heart disease of native coronary artery without angina pectoris: Secondary | ICD-10-CM | POA: Diagnosis present

## 2017-02-14 DIAGNOSIS — R509 Fever, unspecified: Secondary | ICD-10-CM

## 2017-02-14 DIAGNOSIS — K700 Alcoholic fatty liver: Secondary | ICD-10-CM | POA: Diagnosis present

## 2017-02-14 DIAGNOSIS — R339 Retention of urine, unspecified: Secondary | ICD-10-CM | POA: Diagnosis present

## 2017-02-14 DIAGNOSIS — F17210 Nicotine dependence, cigarettes, uncomplicated: Secondary | ICD-10-CM | POA: Diagnosis present

## 2017-02-14 DIAGNOSIS — Z8673 Personal history of transient ischemic attack (TIA), and cerebral infarction without residual deficits: Secondary | ICD-10-CM

## 2017-02-14 DIAGNOSIS — G312 Degeneration of nervous system due to alcohol: Secondary | ICD-10-CM | POA: Diagnosis present

## 2017-02-14 DIAGNOSIS — K859 Acute pancreatitis without necrosis or infection, unspecified: Secondary | ICD-10-CM | POA: Diagnosis present

## 2017-02-14 NOTE — ED Triage Notes (Signed)
Pt presents to ED via EMS with n/v with brownish/red color vomit that look like blood. Pt also relates that he is been feeling worthless due chronic illness but denies plan to hurt self. Pt admits to drinking tonight.

## 2017-02-14 NOTE — ED Notes (Signed)
Bed: [id number] Expected date:  Expected time:  Means of arrival:  Comments: EMS 72 yo male intoxicated vomiting coffee ground

## 2017-02-15 ENCOUNTER — Emergency Department (HOSPITAL_COMMUNITY): Payer: Medicare Other

## 2017-02-15 ENCOUNTER — Encounter (HOSPITAL_COMMUNITY): Payer: Self-pay | Admitting: Emergency Medicine

## 2017-02-15 DIAGNOSIS — K8521 Alcohol induced acute pancreatitis with uninfected necrosis: Secondary | ICD-10-CM | POA: Diagnosis not present

## 2017-02-15 DIAGNOSIS — R531 Weakness: Secondary | ICD-10-CM | POA: Diagnosis not present

## 2017-02-15 DIAGNOSIS — W06XXXA Fall from bed, initial encounter: Secondary | ICD-10-CM | POA: Diagnosis present

## 2017-02-15 DIAGNOSIS — R339 Retention of urine, unspecified: Secondary | ICD-10-CM | POA: Diagnosis present

## 2017-02-15 DIAGNOSIS — Z8249 Family history of ischemic heart disease and other diseases of the circulatory system: Secondary | ICD-10-CM | POA: Diagnosis not present

## 2017-02-15 DIAGNOSIS — R1084 Generalized abdominal pain: Secondary | ICD-10-CM | POA: Diagnosis not present

## 2017-02-15 DIAGNOSIS — F10231 Alcohol dependence with withdrawal delirium: Secondary | ICD-10-CM | POA: Diagnosis not present

## 2017-02-15 DIAGNOSIS — Z8042 Family history of malignant neoplasm of prostate: Secondary | ICD-10-CM | POA: Diagnosis not present

## 2017-02-15 DIAGNOSIS — Z8546 Personal history of malignant neoplasm of prostate: Secondary | ICD-10-CM | POA: Diagnosis not present

## 2017-02-15 DIAGNOSIS — F329 Major depressive disorder, single episode, unspecified: Secondary | ICD-10-CM | POA: Diagnosis present

## 2017-02-15 DIAGNOSIS — K859 Acute pancreatitis without necrosis or infection, unspecified: Secondary | ICD-10-CM | POA: Diagnosis present

## 2017-02-15 DIAGNOSIS — Y92002 Bathroom of unspecified non-institutional (private) residence single-family (private) house as the place of occurrence of the external cause: Secondary | ICD-10-CM | POA: Diagnosis not present

## 2017-02-15 DIAGNOSIS — R042 Hemoptysis: Secondary | ICD-10-CM | POA: Diagnosis present

## 2017-02-15 DIAGNOSIS — K8500 Idiopathic acute pancreatitis without necrosis or infection: Secondary | ICD-10-CM

## 2017-02-15 DIAGNOSIS — B252 Cytomegaloviral pancreatitis: Secondary | ICD-10-CM | POA: Diagnosis not present

## 2017-02-15 DIAGNOSIS — G312 Degeneration of nervous system due to alcohol: Secondary | ICD-10-CM | POA: Diagnosis present

## 2017-02-15 DIAGNOSIS — F1029 Alcohol dependence with unspecified alcohol-induced disorder: Secondary | ICD-10-CM | POA: Diagnosis not present

## 2017-02-15 DIAGNOSIS — I10 Essential (primary) hypertension: Secondary | ICD-10-CM | POA: Diagnosis present

## 2017-02-15 DIAGNOSIS — S2232XA Fracture of one rib, left side, initial encounter for closed fracture: Secondary | ICD-10-CM | POA: Diagnosis not present

## 2017-02-15 DIAGNOSIS — K852 Alcohol induced acute pancreatitis without necrosis or infection: Secondary | ICD-10-CM | POA: Diagnosis not present

## 2017-02-15 DIAGNOSIS — F17210 Nicotine dependence, cigarettes, uncomplicated: Secondary | ICD-10-CM | POA: Diagnosis present

## 2017-02-15 DIAGNOSIS — I252 Old myocardial infarction: Secondary | ICD-10-CM | POA: Diagnosis not present

## 2017-02-15 DIAGNOSIS — Y92003 Bedroom of unspecified non-institutional (private) residence as the place of occurrence of the external cause: Secondary | ICD-10-CM | POA: Diagnosis not present

## 2017-02-15 DIAGNOSIS — I712 Thoracic aortic aneurysm, without rupture: Secondary | ICD-10-CM | POA: Diagnosis present

## 2017-02-15 DIAGNOSIS — K7030 Alcoholic cirrhosis of liver without ascites: Secondary | ICD-10-CM | POA: Diagnosis present

## 2017-02-15 DIAGNOSIS — J690 Pneumonitis due to inhalation of food and vomit: Secondary | ICD-10-CM | POA: Diagnosis not present

## 2017-02-15 DIAGNOSIS — A419 Sepsis, unspecified organism: Secondary | ICD-10-CM | POA: Diagnosis not present

## 2017-02-15 DIAGNOSIS — K700 Alcoholic fatty liver: Secondary | ICD-10-CM | POA: Diagnosis present

## 2017-02-15 DIAGNOSIS — W010XXA Fall on same level from slipping, tripping and stumbling without subsequent striking against object, initial encounter: Secondary | ICD-10-CM | POA: Diagnosis present

## 2017-02-15 DIAGNOSIS — J189 Pneumonia, unspecified organism: Secondary | ICD-10-CM | POA: Diagnosis not present

## 2017-02-15 DIAGNOSIS — E876 Hypokalemia: Secondary | ICD-10-CM | POA: Diagnosis present

## 2017-02-15 DIAGNOSIS — F411 Generalized anxiety disorder: Secondary | ICD-10-CM | POA: Diagnosis present

## 2017-02-15 DIAGNOSIS — Z800 Family history of malignant neoplasm of digestive organs: Secondary | ICD-10-CM | POA: Diagnosis not present

## 2017-02-15 DIAGNOSIS — I2510 Atherosclerotic heart disease of native coronary artery without angina pectoris: Secondary | ICD-10-CM | POA: Diagnosis present

## 2017-02-15 DIAGNOSIS — Z8673 Personal history of transient ischemic attack (TIA), and cerebral infarction without residual deficits: Secondary | ICD-10-CM | POA: Diagnosis not present

## 2017-02-15 DIAGNOSIS — F1010 Alcohol abuse, uncomplicated: Secondary | ICD-10-CM | POA: Diagnosis not present

## 2017-02-15 DIAGNOSIS — K920 Hematemesis: Secondary | ICD-10-CM | POA: Insufficient documentation

## 2017-02-15 LAB — CBC
HCT: 47.3 % (ref 39.0–52.0)
HEMOGLOBIN: 16.7 g/dL (ref 13.0–17.0)
MCH: 32.9 pg (ref 26.0–34.0)
MCHC: 35.3 g/dL (ref 30.0–36.0)
MCV: 93.3 fL (ref 78.0–100.0)
PLATELETS: 154 10*3/uL (ref 150–400)
RBC: 5.07 MIL/uL (ref 4.22–5.81)
RDW: 14.8 % (ref 11.5–15.5)
WBC: 10.4 10*3/uL (ref 4.0–10.5)

## 2017-02-15 LAB — MAGNESIUM: Magnesium: 2.2 mg/dL (ref 1.7–2.4)

## 2017-02-15 LAB — COMPREHENSIVE METABOLIC PANEL
ALBUMIN: 4.9 g/dL (ref 3.5–5.0)
ALK PHOS: 94 U/L (ref 38–126)
ALT: 90 U/L — AB (ref 17–63)
ANION GAP: 25 — AB (ref 5–15)
AST: 182 U/L — ABNORMAL HIGH (ref 15–41)
BILIRUBIN TOTAL: 1.8 mg/dL — AB (ref 0.3–1.2)
BUN: 23 mg/dL — ABNORMAL HIGH (ref 6–20)
CALCIUM: 8.9 mg/dL (ref 8.9–10.3)
CO2: 12 mmol/L — ABNORMAL LOW (ref 22–32)
CREATININE: 1.01 mg/dL (ref 0.61–1.24)
Chloride: 102 mmol/L (ref 101–111)
GFR calc Af Amer: >60 mL/min (ref >60)
GFR calc non Af Amer: >60 mL/min (ref >60)
GLUCOSE: 128 mg/dL — AB (ref 65–99)
Potassium: 4.6 mmol/L (ref 3.5–5.1)
Sodium: 139 mmol/L (ref 135–145)
TOTAL PROTEIN: 7.7 g/dL (ref 6.5–8.1)

## 2017-02-15 LAB — TYPE AND SCREEN
ABO/RH(D): B NEG
ANTIBODY SCREEN: NEGATIVE

## 2017-02-15 LAB — MRSA PCR SCREENING: MRSA by PCR: NEGATIVE

## 2017-02-15 LAB — LIPASE, BLOOD: Lipase: 1399 U/L — ABNORMAL HIGH (ref 11–51)

## 2017-02-15 LAB — POC OCCULT BLOOD, ED: FECAL OCCULT BLD: NEGATIVE

## 2017-02-15 MED ORDER — LEVALBUTEROL HCL 0.63 MG/3ML IN NEBU: 0.6300 mg | INHALATION_SOLUTION | Freq: Four times a day (QID) | RESPIRATORY_TRACT | Status: DC | PRN

## 2017-02-15 MED ORDER — LORAZEPAM 2 MG/ML IJ SOLN
0.5000 mg | Freq: Once | INTRAMUSCULAR | Status: AC
  Administered 2017-02-15: 0.5 mg via INTRAVENOUS
  Filled 2017-02-15: qty 1

## 2017-02-15 MED ORDER — SODIUM CHLORIDE 0.9 % IV BOLUS (SEPSIS): 1000.0000 mL | Freq: Once | INTRAVENOUS | Status: DC

## 2017-02-15 MED ORDER — PIPERACILLIN-TAZOBACTAM 3.375 G IVPB
3.3750 g | Freq: Three times a day (TID) | INTRAVENOUS | Status: DC
  Administered 2017-02-15 – 2017-02-16 (×3): 3.375 g via INTRAVENOUS
  Filled 2017-02-15 (×3): qty 50

## 2017-02-15 MED ORDER — LORAZEPAM 2 MG/ML IJ SOLN
1.0000 mg | Freq: Four times a day (QID) | INTRAMUSCULAR | Status: DC | PRN
  Administered 2017-02-15: 1 mg via INTRAVENOUS
  Filled 2017-02-15: qty 1

## 2017-02-15 MED ORDER — LORAZEPAM 2 MG/ML IJ SOLN
0.0000 mg | Freq: Four times a day (QID) | INTRAMUSCULAR | Status: DC
  Administered 2017-02-15: 1 mg via INTRAVENOUS
  Administered 2017-02-15: 2 mg via INTRAVENOUS
  Administered 2017-02-16 (×2): 1 mg via INTRAVENOUS
  Filled 2017-02-15 (×4): qty 1

## 2017-02-15 MED ORDER — LORAZEPAM 1 MG PO TABS
1.0000 mg | ORAL_TABLET | Freq: Four times a day (QID) | ORAL | Status: DC | PRN
  Administered 2017-02-16: 1 mg via ORAL
  Filled 2017-02-15: qty 1

## 2017-02-15 MED ORDER — SODIUM CHLORIDE 0.9 % IV SOLN
INTRAVENOUS | Status: DC
  Administered 2017-02-15 – 2017-02-18 (×5): via INTRAVENOUS

## 2017-02-15 MED ORDER — DEXTROSE 5 % AND 0.9 % NACL IV BOLUS
1000.0000 mL | Freq: Once | INTRAVENOUS | Status: AC
  Administered 2017-02-15: 1000 mL via INTRAVENOUS

## 2017-02-15 MED ORDER — SODIUM CHLORIDE 0.9 % IV SOLN
80.0000 mg | Freq: Once | INTRAVENOUS | Status: AC
  Administered 2017-02-15: 09:00:00 80 mg via INTRAVENOUS
  Filled 2017-02-15: qty 80

## 2017-02-15 MED ORDER — SODIUM CHLORIDE 0.9 % IV SOLN
8.0000 mg/h | INTRAVENOUS | Status: DC
  Administered 2017-02-15 (×2): 8 mg/h via INTRAVENOUS
  Filled 2017-02-15: qty 80

## 2017-02-15 MED ORDER — PIPERACILLIN-TAZOBACTAM 3.375 G IVPB 30 MIN
3.3750 g | Freq: Once | INTRAVENOUS | Status: AC
  Administered 2017-02-15: 3.375 g via INTRAVENOUS
  Filled 2017-02-15: qty 50

## 2017-02-15 MED ORDER — POTASSIUM CHLORIDE IN NACL 20-0.9 MEQ/L-% IV SOLN
INTRAVENOUS | Status: DC
  Administered 2017-02-15: 09:00:00 via INTRAVENOUS
  Filled 2017-02-15: qty 1000

## 2017-02-15 MED ORDER — LEVOFLOXACIN IN D5W 750 MG/150ML IV SOLN
750.0000 mg | Freq: Once | INTRAVENOUS | Status: AC
  Administered 2017-02-15: 750 mg via INTRAVENOUS
  Filled 2017-02-15: qty 150

## 2017-02-15 MED ORDER — VITAMIN B-1 100 MG PO TABS: 100.0000 mg | ORAL_TABLET | Freq: Every day | ORAL | Status: DC

## 2017-02-15 MED ORDER — ADULT MULTIVITAMIN W/MINERALS CH
1.0000 | ORAL_TABLET | Freq: Every day | ORAL | Status: DC
  Administered 2017-02-15 – 2017-02-16 (×2): 1 via ORAL
  Filled 2017-02-15 (×2): qty 1

## 2017-02-15 MED ORDER — LACTATED RINGERS IV SOLN
INTRAVENOUS | Status: DC
  Administered 2017-02-15: 07:00:00 via INTRAVENOUS

## 2017-02-15 MED ORDER — IOPAMIDOL (ISOVUE-300) INJECTION 61%
INTRAVENOUS | Status: AC
  Administered 2017-02-15: 75 mL via INTRAVENOUS
  Filled 2017-02-15: qty 75

## 2017-02-15 MED ORDER — THIAMINE HCL 100 MG/ML IJ SOLN: 100.0000 mg | Freq: Every day | INTRAMUSCULAR | Status: DC

## 2017-02-15 MED ORDER — ORAL CARE MOUTH RINSE
15.0000 mL | Freq: Two times a day (BID) | OROMUCOSAL | Status: DC
  Administered 2017-02-15 – 2017-02-23 (×14): 15 mL via OROMUCOSAL

## 2017-02-15 MED ORDER — MAGNESIUM SULFATE 2 GM/50ML IV SOLN
2.0000 g | Freq: Once | INTRAVENOUS | Status: AC
  Administered 2017-02-15: 2 g via INTRAVENOUS
  Filled 2017-02-15: qty 50

## 2017-02-15 MED ORDER — ONDANSETRON HCL 4 MG PO TABS: 4.0000 mg | ORAL_TABLET | Freq: Four times a day (QID) | ORAL | Status: DC | PRN

## 2017-02-15 MED ORDER — SODIUM CHLORIDE 0.9 % IV BOLUS (SEPSIS)
500.0000 mL | Freq: Once | INTRAVENOUS | Status: AC
  Administered 2017-02-15: 500 mL via INTRAVENOUS

## 2017-02-15 MED ORDER — FENTANYL CITRATE (PF) 100 MCG/2ML IJ SOLN
50.0000 ug | Freq: Once | INTRAMUSCULAR | Status: AC
  Administered 2017-02-15: 50 ug via INTRAVENOUS
  Filled 2017-02-15: qty 2

## 2017-02-15 MED ORDER — SODIUM CHLORIDE 0.9% FLUSH
3.0000 mL | Freq: Two times a day (BID) | INTRAVENOUS | Status: DC
  Administered 2017-02-15 – 2017-02-22 (×5): 3 mL via INTRAVENOUS

## 2017-02-15 MED ORDER — ONDANSETRON HCL 4 MG/2ML IJ SOLN
4.0000 mg | Freq: Once | INTRAMUSCULAR | Status: AC
  Administered 2017-02-15: 4 mg via INTRAVENOUS
  Filled 2017-02-15: qty 2

## 2017-02-15 MED ORDER — MIRTAZAPINE 15 MG PO TABS
7.5000 mg | ORAL_TABLET | Freq: Every day | ORAL | Status: DC
  Administered 2017-02-15 – 2017-02-21 (×6): 7.5 mg via ORAL
  Filled 2017-02-15 (×8): qty 1

## 2017-02-15 MED ORDER — PANTOPRAZOLE SODIUM 40 MG IV SOLR: 40.0000 mg | Freq: Two times a day (BID) | INTRAVENOUS | Status: DC

## 2017-02-15 MED ORDER — ONDANSETRON HCL 4 MG/2ML IJ SOLN: 4.0000 mg | Freq: Four times a day (QID) | INTRAMUSCULAR | Status: DC | PRN

## 2017-02-15 MED ORDER — FOLIC ACID 1 MG PO TABS
1.0000 mg | ORAL_TABLET | Freq: Every day | ORAL | Status: DC
  Administered 2017-02-16 – 2017-02-23 (×8): 1 mg via ORAL
  Filled 2017-02-15 (×8): qty 1

## 2017-02-15 MED ORDER — VITAMIN B-1 100 MG PO TABS
100.0000 mg | ORAL_TABLET | Freq: Every day | ORAL | Status: DC
  Administered 2017-02-15: 100 mg via ORAL
  Filled 2017-02-15: qty 1

## 2017-02-15 MED ORDER — FOLIC ACID 1 MG PO TABS
1.0000 mg | ORAL_TABLET | Freq: Every day | ORAL | Status: DC
  Administered 2017-02-15: 1 mg via ORAL
  Filled 2017-02-15: qty 1

## 2017-02-15 MED ORDER — OXYCODONE HCL 5 MG PO TABS
5.0000 mg | ORAL_TABLET | ORAL | Status: DC | PRN
  Administered 2017-02-15 – 2017-02-23 (×29): 5 mg via ORAL
  Filled 2017-02-15 (×29): qty 1

## 2017-02-15 MED ORDER — PANTOPRAZOLE SODIUM 40 MG IV SOLR
40.0000 mg | Freq: Every day | INTRAVENOUS | Status: DC
  Administered 2017-02-15 – 2017-02-19 (×5): 40 mg via INTRAVENOUS
  Filled 2017-02-15 (×5): qty 40

## 2017-02-15 MED ORDER — IOPAMIDOL (ISOVUE-300) INJECTION 61%
75.0000 mL | Freq: Once | INTRAVENOUS | Status: AC | PRN
  Administered 2017-02-15: 75 mL via INTRAVENOUS

## 2017-02-15 MED ORDER — TAMSULOSIN HCL 0.4 MG PO CAPS
0.4000 mg | ORAL_CAPSULE | Freq: Every day | ORAL | Status: DC
  Administered 2017-02-15 – 2017-02-23 (×9): 0.4 mg via ORAL
  Filled 2017-02-15 (×9): qty 1

## 2017-02-15 MED ORDER — VITAMIN B-1 100 MG PO TABS
100.0000 mg | ORAL_TABLET | Freq: Every day | ORAL | Status: DC
  Administered 2017-02-16 – 2017-02-23 (×8): 100 mg via ORAL
  Filled 2017-02-15 (×8): qty 1

## 2017-02-15 MED ORDER — CARVEDILOL 12.5 MG PO TABS
12.5000 mg | ORAL_TABLET | Freq: Two times a day (BID) | ORAL | Status: DC
  Administered 2017-02-15 – 2017-02-17 (×4): 12.5 mg via ORAL
  Filled 2017-02-15 (×5): qty 1

## 2017-02-15 MED ORDER — BUPROPION HCL ER (SR) 100 MG PO TB12
200.0000 mg | ORAL_TABLET | Freq: Every day | ORAL | Status: DC
  Administered 2017-02-15 – 2017-02-23 (×9): 200 mg via ORAL
  Filled 2017-02-15 (×9): qty 2

## 2017-02-15 MED ORDER — LORAZEPAM 2 MG/ML IJ SOLN: 0.0000 mg | Freq: Two times a day (BID) | INTRAMUSCULAR | Status: DC

## 2017-02-15 MED ORDER — SENNOSIDES-DOCUSATE SODIUM 8.6-50 MG PO TABS: 1.0000 | ORAL_TABLET | Freq: Every evening | ORAL | Status: DC | PRN

## 2017-02-15 NOTE — ED Notes (Addendum)
Pt's wife ran to nurse's station yelling that pt was vomiting.  Wife was hysterical and screaming.  Walked into pt's room to find him with some vomit on chest but airway patent, pt still alert and oriented, able to breathe.  Pt turned onto side, mouth did not require suctioning, no oxygen needed.  VS remain stable though HR was elevated for a minute from the vomiting.  Pt's lungs auscultated by me, lung sounds the same as before the vomiting.  Voice clear.  No obvious signs of aspiration.  Zofran, ativan, and fentanyl given.  MD in room right after to assess & aware of this.  Vomit yellowish and thick.

## 2017-02-15 NOTE — H&P (Signed)
[facility] History and Physical  Joshua Henson [id number] DOB: [birth year] DOA: 02/14/2017  Referring physician:   PCP: Mayra Neer, MD   Chief Complaint: * Intractable nausea  HPI:   71 y.o. male with medical history significant of alcohol abuse, stroke, and STEMI, hypertension, depression, anxiety, alcohol cirrhosis, hepatic encephalopathy , who presents after   Falls  and intractable nausea vomiting. Patient brought in via EMS. Initially there was a suspicion of coffee-ground emesis. Patient is alcoholic and admits to drinking a pint of vodka everyday. He started feeling bad 2 days ago after he lost his balance and fell in the bathroom. Subsequently he developed left-sided chest wall pain. As per ED notes patient was again on the floor around 5 PM yesterday when apparently he fell out of the bed. Patient has not taken any of his regular medications for the last 4 days, because of excessive alcohol use. Patient also complained of urinary retention, and unable to urinate. Patient denied any abdominal pain or shortness of breath.  ED course VS temp- 97.1F, pulse 90-116, respirations 21-31, BP maintained, O2 saturations 91-100%.  Lab work reveals hemoglobin 16.7, BUN 23, Cr 1.01, lipase 1399, AST 182, ALT 90. Hemoccult negative CT scan of the chest reveals left rib fracture 2/2 fall, possible aspiration pneumonia, and acute pancreatitis. He was given Levaquin for aspiration pneumonia. Started on [employer]   . [facility] called to admit inpatient admission to telemetry bed.    Review of Systems: negative for the following  Constitutional: Denies fever, chills, diaphoresis, appetite change and fatigue.  HEENT: Denies photophobia, eye pain, redness, hearing loss, ear pain, congestion, sore throat, rhinorrhea, sneezing, mouth sores, trouble swallowing, neck pain, neck stiffness and tinnitus.  Respiratory: Denies SOB, DOE, cough, chest tightness, and wheezing.  Cardiovascular: Positive for  left-sided chest pain, palpitations and leg swelling.  Gastrointestinal: Positive for nausea, vomiting, abdominal pain, diarrhea, constipation, blood in stool and abdominal distention.  Genitourinary: Denies dysuria, urgency, frequency, hematuria, flank pain and difficulty urinating.  Musculoskeletal: Denies myalgias, back pain, joint swelling, arthralgias and gait problem.  Skin: Denies pallor, rash and wound.  Neurological: Denies dizziness, seizures, syncope, weakness, light-headedness, numbness and headaches.  Hematological: Denies adenopathy. Easy bruising, personal or family bleeding history  Psychiatric/Behavioral: Denies suicidal ideation, mood changes, confusion, nervousness, sleep disturbance and agitation       Past Medical History:  Diagnosis Date  . Acute hepatic encephalopathy   . Alcoholic cirrhosis of liver without ascites ([place])   . Anxiety state   . CAD (coronary artery disease), native coronary artery 01/10/2016   3 vessel coronary calcification noted on CT scan 04/03/15    . Depression   . Fatty liver, alcoholic [id number]  . Hypertension   . Increased ammonia level   . Insomnia   . NSTEMI (non-ST elevated myocardial infarction) ([place])   . Prostate cancer ([place]) 2010   External beam radiation (urol - Risa Grill, XRT Valere Dross)  . Stroke ([place])   . Tachycardia      Past Surgical History:  Procedure Laterality Date  . CARDIAC CATHETERIZATION N/A 01/15/2016   Procedure: Left Heart Cath and Coronary Angiography;  Surgeon: Burnell Blanks, MD;  Location: [facility];  Service: Cardiovascular;  Laterality: N/A;      Social History:  reports that he has been smoking Cigarettes.  He has a 15.00 pack-year smoking history. He has never used smokeless tobacco. He reports that he drinks about 33.6 oz of alcohol per week . He reports that  he does not use drugs.    Allergies  Allergen Reactions  . Lisinopril     syncope  . Celebrex [Celecoxib] Rash    Family  History  Problem Relation Age of Onset  . Cancer Mother     esophageal  . Hypertension Mother   . Diabetes Mother   . Cancer Father     prostate  . Heart disease Maternal Grandfather     MI  . Cancer Paternal Grandfather     prostate        Prior to Admission medications   Medication Sig Start Date End Date Taking? Authorizing Provider  aspirin 81 MG EC tablet Take 1 tablet (81 mg total) by mouth daily. 07/11/14  Yes Daniel J Angiulli, PA-C  atorvastatin (LIPITOR) 40 MG tablet Take 1 tablet (40 mg total) by mouth daily at 6 PM. 01/19/16  Yes Allie Bossier, MD  buPROPion [facility] SR) 200 MG 12 hr tablet Take 200 mg by mouth daily.   Yes Historical Provider, MD  carvedilol (COREG) 25 MG tablet Take 1 tablet (25 mg total) by mouth 2 (two) times daily with a meal. Patient taking differently: Take 12.5 mg by mouth 2 (two) times daily with a meal.  09/23/16  Yes Patrecia Pour, MD  cholecalciferol (VITAMIN D) 1000 units tablet Take 1,000 Units by mouth 2 (two) times daily.    Yes Historical Provider, MD  Coenzyme Q10 (CO Q-10) 200 MG CAPS Take 200 mg by mouth daily.   Yes Historical Provider, MD  folic acid (FOLVITE) 1 MG tablet take 1 tablet by mouth once daily 01/12/17  Yes Monina C Medina-Vargas, NP  hydrOXYzine (ATARAX/VISTARIL) 10 MG tablet Take 1 tablet (10 mg total) by mouth 3 (three) times daily as needed for anxiety. 05/13/16  Yes Velvet Bathe, MD  LORazepam (ATIVAN) 1 MG tablet Take 1 mg by mouth 2 (two) times daily as needed for anxiety.   Yes Historical Provider, MD  Magnesium 500 MG CAPS Take 500 mg by mouth at bedtime.   Yes Historical Provider, MD  mirtazapine (REMERON) 7.5 MG tablet take 1 tablet by mouth at bedtime 01/12/17  Yes Monina C Medina-Vargas, NP  nitroGLYCERIN (NITROSTAT) 0.4 MG SL tablet Place 1 tablet (0.4 mg total) under the tongue every 5 (five) minutes as needed for chest pain. 05/13/16  Yes Velvet Bathe, MD  Omega-3 Fatty Acids (FISH OIL) 1000 MG CAPS Take 1,000  mg by mouth 2 (two) times daily.    Yes Historical Provider, MD  potassium chloride SA (K-DUR,KLOR-CON) 20 MEQ tablet take 2 tablets by mouth once daily Patient taking differently: take 1 tablets by mouth once daily 01/12/17  Yes Monina C Medina-Vargas, NP  tamsulosin (FLOMAX) 0.4 MG CAPS capsule take 1 capsule by mouth once daily 01/12/17  Yes Monina C Medina-Vargas, NP  thiamine (VITAMIN B-1) 100 MG tablet Take 100 mg by mouth daily.   Yes Historical Provider, MD  Multiple Vitamin (MULTIVITAMIN WITH MINERALS) TABS tablet Take 1 tablet by mouth daily. Patient not taking: Reported on 02/15/2017 05/13/16   Velvet Bathe, MD     Physical Exam: Vitals:   02/15/17 0655 02/15/17 0734 02/15/17 0800 02/15/17 0823  BP: 160/100 (!) 148/101 (!) 154/101 (!) 139/103  Pulse: 115   (!) 123  Resp: (!) 30 (!) 29 (!) 28 21  Temp:    98.9 F (37.2 C)  TempSrc:    Oral  SpO2: 92% 96% 94% 99%  Weight:  Height:          Constitutional: NAD, calm, comfortable Vitals:   02/15/17 0655 02/15/17 0734 02/15/17 0800 02/15/17 0823  BP: 160/100 (!) 148/101 (!) 154/101 (!) 139/103  Pulse: 115   (!) 123  Resp: (!) 30 (!) 29 (!) 28 21  Temp:    98.9 F (37.2 C)  TempSrc:    Oral  SpO2: 92% 96% 94% 99%  Weight:      Height:       Constitutional: He is oriented to person, place, and time. Vital signs are normal. He appears well-developed and well-nourished.  Non-toxic appearance. He does not appear ill. No distress.  Clinically intoxicated.  HENT:  Head: Normocephalic and atraumatic.  Nose: Nose normal.  Mouth/Throat: Oropharynx is clear and moist. No oropharyngeal exudate.  Eyes: Conjunctivae and EOM are normal. Pupils are equal, round, and reactive to light. No scleral icterus.  Neck: Normal range of motion. Neck supple. No tracheal deviation, no edema, no erythema and normal range of motion present. No thyroid mass and no thyromegaly present.  Cardiovascular: Regular rhythm, S1 normal, S2 normal,  normal heart sounds, intact distal pulses and normal pulses.  Tachycardia present.  Exam reveals no gallop and no friction rub.   No murmur heard. Tachycardic   Pulmonary/Chest: Effort normal and breath sounds normal. No respiratory distress. He has no wheezes. He has no rhonchi. He has no rales.  Left lower ribs TTP.  Abdominal: Soft. Normal appearance and bowel sounds are normal. He exhibits no distension, no ascites and no mass. There is no hepatosplenomegaly. There is no tenderness. There is no rebound, no guarding and no CVA tenderness.  Musculoskeletal: Normal range of motion. He exhibits no edema or tenderness.  Lymphadenopathy:    He has no cervical adenopathy.  Neurological: He is alert and oriented to person, place, and time. He has normal strength. No cranial nerve deficit or sensory deficit    Labs on Admission: I have personally reviewed following labs and imaging studies  CBC:  Recent Labs Lab 02/15/17 0034  WBC 10.4  HGB 16.7  HCT 47.3  MCV 93.3  PLT 123456    Basic Metabolic Panel:  Recent Labs Lab 02/15/17 0034 02/15/17 0435  NA 139  --   K 4.6  --   CL 102  --   CO2 12*  --   GLUCOSE 128*  --   BUN 23*  --   CREATININE 1.01  --   CALCIUM 8.9  --   MG  --  2.2    GFR: Estimated Creatinine Clearance: 71.3 mL/min (by C-G formula based on SCr of 1.01 mg/dL).  Liver Function Tests:  Recent Labs Lab 02/15/17 0034  AST 182*  ALT 90*  ALKPHOS 94  BILITOT 1.8*  PROT 7.7  ALBUMIN 4.9    Recent Labs Lab 02/15/17 0034  LIPASE 1,399*   No results for input(s): AMMONIA in the last 168 hours.  Coagulation Profile: No results for input(s): INR, PROTIME in the last 168 hours. No results for input(s): DDIMER in the last 72 hours.  Cardiac Enzymes: No results for input(s): CKTOTAL, CKMB, CKMBINDEX, TROPONINI in the last 168 hours.  BNP (last 3 results) No results for input(s): PROBNP in the last 8760 hours.  HbA1C: No results for input(s):  HGBA1C in the last 72 hours. Lab Results  Component Value Date   HGBA1C 5.7 (H) 09/03/2014   HGBA1C 5.6 07/04/2014     CBG: No results for input(s): GLUCAP  in the last 168 hours.  Lipid Profile: No results for input(s): CHOL, HDL, LDLCALC, TRIG, CHOLHDL, LDLDIRECT in the last 72 hours.  Thyroid Function Tests: No results for input(s): TSH, T4TOTAL, FREET4, T3FREE, THYROIDAB in the last 72 hours.  Anemia Panel: No results for input(s): VITAMINB12, FOLATE, FERRITIN, TIBC, IRON, RETICCTPCT in the last 72 hours.  Urine analysis:    Component Value Date/Time   COLORURINE YELLOW 10/21/2016 [place] 10/21/2016 1835   LABSPEC 1.024 10/21/2016 1835   PHURINE 6.5 10/21/2016 1835   GLUCOSEU NEGATIVE 10/21/2016 1835   HGBUR NEGATIVE 10/21/2016 1835   BILIRUBINUR NEGATIVE 10/21/2016 1835   KETONESUR NEGATIVE 10/21/2016 1835   PROTEINUR NEGATIVE 10/21/2016 1835   UROBILINOGEN 2.0 (H) 04/02/2015 2013   NITRITE NEGATIVE 10/21/2016 1835   LEUKOCYTESUR NEGATIVE 10/21/2016 1835    Sepsis Labs: @LABRCNTIP (procalcitonin:4,lacticidven:4) )No results found for this or any previous visit (from the past 240 hour(s)).       Radiological Exams on Admission: Ct Chest W Contrast  Result Date: 02/15/2017 CLINICAL DATA:  Status post fall out of bed, with left rib pain. Hemoptysis or hematemesis. Mild leukocytosis. Initial encounter. EXAM: CT CHEST WITH CONTRAST TECHNIQUE: Multidetector CT imaging of the chest was performed during intravenous contrast administration. CONTRAST:  75 mL of Isovue 300 IV contrast COMPARISON:  Chest radiograph performed 09/20/2016 FINDINGS: Cardiovascular: Scattered coronary artery calcifications are seen. There is borderline dilatation of the ascending thoracic aorta to 4.1 cm in AP dimension. Scattered calcification is noted along the descending thoracic aorta. The heart remains normal in size. The great vessels are grossly unremarkable in appearance,  aside from minimal calcification along the proximal right subclavian artery. Mediastinum/Nodes: No mediastinal lymphadenopathy is seen. No pericardial effusion is identified. Mild nonspecific soft tissue inflammation is noted at the left epicardial fat pad. The thyroid gland is unremarkable. No axillary lymphadenopathy is seen. Lungs/Pleura: Patchy bibasilar airspace opacities may reflect atelectasis or mild pneumonia. The lungs are otherwise clear. No pleural effusion or pneumothorax is seen. No mass is identified. A calcified granuloma is noted at the right lung base. Upper Abdomen: There is diffuse fatty infiltration within the liver. Diffuse fluid and edema are seen tracking about the entirety of the pancreas and the second segment of the duodenum, suspicious for acute pancreatitis. There is no definite evidence of devascularization or pseudocyst formation at this time. Scattered calcified granulomata are seen within the spleen. Nonspecific perinephric stranding is noted bilaterally. A small right renal cyst is seen. Musculoskeletal: There is a mildly displaced left anterolateral seventh rib fracture. Additional chronic bilateral rib deformities are noted. The visualized musculature is unremarkable in appearance. IMPRESSION: 1. Mildly displaced left anterolateral seventh rib fracture. 2. Diffuse fluid and edema tracking about the entirety of the pancreas and the second segment of the duodenum, suspicious for acute pancreatitis. No definite evidence of devascularization or pseudocyst formation at this time. 3. Patchy bibasilar airspace opacities may reflect atelectasis or mild pneumonia. 4. Borderline aneurysmal dilatation of the ascending thoracic aorta to 4.1 cm in AP dimension. Recommend annual imaging followup by CTA or MRA. This recommendation follows 2010 ACCF/AHA/AATS/ACR/ASA/SCA/SCAI/SIR/STS/SVM Guidelines for the Diagnosis and Management of Patients with Thoracic Aortic Disease. Circulation. 2010; 121:  e266-e369 5. Scattered coronary artery calcifications seen. 6. Mild nonspecific soft tissue inflammation at the left epicardial fat pad. 7. Diffuse fatty infiltration within the liver. 8. Small right renal cyst seen. These results were called by telephone at the time of interpretation on 02/15/2017 at 4:33 am to  Dr. Everlene Balls, who verbally acknowledged these results. Electronically Signed   By: Garald Balding M.D.   On: 02/15/2017 04:35   Ct Chest W Contrast  Result Date: 02/15/2017 CLINICAL DATA:  Status post fall out of bed, with left rib pain. Hemoptysis or hematemesis. Mild leukocytosis. Initial encounter. EXAM: CT CHEST WITH CONTRAST TECHNIQUE: Multidetector CT imaging of the chest was performed during intravenous contrast administration. CONTRAST:  75 mL of Isovue 300 IV contrast COMPARISON:  Chest radiograph performed 09/20/2016 FINDINGS: Cardiovascular: Scattered coronary artery calcifications are seen. There is borderline dilatation of the ascending thoracic aorta to 4.1 cm in AP dimension. Scattered calcification is noted along the descending thoracic aorta. The heart remains normal in size. The great vessels are grossly unremarkable in appearance, aside from minimal calcification along the proximal right subclavian artery. Mediastinum/Nodes: No mediastinal lymphadenopathy is seen. No pericardial effusion is identified. Mild nonspecific soft tissue inflammation is noted at the left epicardial fat pad. The thyroid gland is unremarkable. No axillary lymphadenopathy is seen. Lungs/Pleura: Patchy bibasilar airspace opacities may reflect atelectasis or mild pneumonia. The lungs are otherwise clear. No pleural effusion or pneumothorax is seen. No mass is identified. A calcified granuloma is noted at the right lung base. Upper Abdomen: There is diffuse fatty infiltration within the liver. Diffuse fluid and edema are seen tracking about the entirety of the pancreas and the second segment of the duodenum,  suspicious for acute pancreatitis. There is no definite evidence of devascularization or pseudocyst formation at this time. Scattered calcified granulomata are seen within the spleen. Nonspecific perinephric stranding is noted bilaterally. A small right renal cyst is seen. Musculoskeletal: There is a mildly displaced left anterolateral seventh rib fracture. Additional chronic bilateral rib deformities are noted. The visualized musculature is unremarkable in appearance. IMPRESSION: 1. Mildly displaced left anterolateral seventh rib fracture. 2. Diffuse fluid and edema tracking about the entirety of the pancreas and the second segment of the duodenum, suspicious for acute pancreatitis. No definite evidence of devascularization or pseudocyst formation at this time. 3. Patchy bibasilar airspace opacities may reflect atelectasis or mild pneumonia. 4. Borderline aneurysmal dilatation of the ascending thoracic aorta to 4.1 cm in AP dimension. Recommend annual imaging followup by CTA or MRA. This recommendation follows 2010 ACCF/AHA/AATS/ACR/ASA/SCA/SCAI/SIR/STS/SVM Guidelines for the Diagnosis and Management of Patients with Thoracic Aortic Disease. Circulation. 2010; 121: e266-e369 5. Scattered coronary artery calcifications seen. 6. Mild nonspecific soft tissue inflammation at the left epicardial fat pad. 7. Diffuse fatty infiltration within the liver. 8. Small right renal cyst seen. These results were called by telephone at the time of interpretation on 02/15/2017 at 4:33 am to Dr. Everlene Balls, who verbally acknowledged these results. Electronically Signed   By: Garald Balding M.D.   On: 02/15/2017 04:35      EKG: Independently reviewed.    Assessment/Plan Acute alcoholic pancreatitis We'll manage empirically IV fluids, and antiemetics, bowel rest Intractable nausea vomiting likely secondary to acute pancreatitis  Bilateral Aspiration pneumonia Patient initiated on Zosyn, will obtain blood cultures May be  able to transition to oral antibiotics in 48 hours  Alcohol abuse Patient started on CIWA protocol Low threshold for safety sitter May going to severe DT's , given degree of alcohol dependence  Essential hypertension Continue Coreg  History of depression Continue Wellbutrin, Vistaril,  Urinary retention We'll check postvoid residuals and in and out cath if needed Continue Flomax  Ascending thoracic aortic aneurysm-measuring 4.1 cm, this will need outpatient follow-up PCP Outpatient thoracic surgery evaluation recommended  Displaced left anterolateral seventh rib fracture This will be managed conservatively with oral oxycodone        DVT prophylaxis: Lovenox     Code Status Orders      consults called: None  Family Communication: Admission, patients condition and plan of care including tests being ordered have been discussed with the patient  who indicates understanding and agree with the plan and Code Status  Admission status: inpatient    Disposition plan: Further plan will depend as patient's clinical course evolves and further radiologic and laboratory data become available. Likely home when stable   At the time of admission, it appears that the appropriate admission status for this patient is INPATIENT .Thisis judged to be reasonable and necessary in order to provide the required intensity of service to ensure the patient's safetygiven thepresenting symptoms, physical exam findings, and initial radiographic and laboratory data in the context of their chronic comorbidities.   Reyne Dumas MD [facility] Pager [phone]  If 7PM-7AM, please contact night-coverage [email or web address] Password [email or web address]  02/15/2017, 9:08 AM

## 2017-02-15 NOTE — ED Notes (Signed)
PT IS HARD OF HEARING

## 2017-02-15 NOTE — ED Provider Notes (Signed)
[facility] DEPT Provider Note   CSN: [id number] Arrival date & time: 02/14/17  2347  By signing my name below, I, Soijett Blue, attest that this documentation has been prepared under the direction and in the presence of Everlene Balls, MD. Electronically Signed: Soijett Blue, ED Scribe. 02/15/17. 1:59 AM.  History   Chief Complaint Chief Complaint  Patient presents with  . Emesis  . Alcohol Problem    HPI Joshua Henson is a 71 y.o. male with a PMHx of alcoholic cirrhosis of liver, HTN, prostate CA, who presents to the Emergency Department brought in by EMS complaining of emesis onset 11 PM last night. Wife notes that it appears that there was blood in the pt emesis PTA. Pt reports associated urinary retention and left sided rib pain s/p fall last night. Spouse reports that she found the pt on the floor around 5 PM yesterday and he informed her that he fell from the bed. Wife states that the pt hasn't taken his medications in the past 4 days. Pt notes that he consumes 1 pint of ETOH daily. He denies any other symptoms.    The history is provided by the patient. No language interpreter was used.    Past Medical History:  Diagnosis Date  . Acute hepatic encephalopathy   . Alcoholic cirrhosis of liver without ascites ([place])   . Anxiety state   . CAD (coronary artery disease), native coronary artery 01/10/2016   3 vessel coronary calcification noted on CT scan 04/03/15    . Depression   . Fatty liver, alcoholic [id number]  . Hypertension   . Increased ammonia level   . Insomnia   . NSTEMI (non-ST elevated myocardial infarction) ([place])   . Prostate cancer ([place]) 2010   External beam radiation (urol - Risa Grill, XRT Valere Dross)  . Stroke ([place])   . Tachycardia     Patient Active Problem List   Diagnosis Date Noted  . Fever   . Alcohol withdrawal delirium ([place]) 09/13/2016  . Left upper quadrant pain 05/10/2016  . Encephalopathy 05/09/2016  . C. difficile colitis 03/30/2016  . Acute  kidney injury ([place]) 03/30/2016  . Essential hypertension 03/23/2016  . Fall   . Tobacco abuse   . Hypokalemia   . Hypomagnesemia   . Hypophosphatemia   . CAP (community acquired pneumonia) due to MSSA (methicillin sensitive Staphylococcus aureus) ([place])   . CAD (coronary artery disease)   . Cocaine abuse   . Alcohol abuse with intoxication ([place]) 01/11/2016  . Frequent falls 01/11/2016  . Left rib fracture 01/11/2016  . Acute encephalopathy 01/11/2016  . Polysubstance abuse 01/11/2016  . Delirium tremens ([place]) 01/11/2016  . NSTEMI (non-ST elevated myocardial infarction) ([place]) 01/11/2016  . Sepsis due to Enterococcus with acute renal failure and metabolic encephalopathy ([place]) 01/11/2016  . Alcohol intoxication ([place])   . Chronic alcohol abuse   . Acute respiratory failure with hypoxia ([place])   . Elevated troponin 01/10/2016  . Chest pain 01/10/2016  . CAD (coronary artery disease), native coronary artery 01/10/2016  . Fatty liver, alcoholic [id number]  . Tremor 04/02/2015  . Ataxia   . Alcohol withdrawal, with delirium ([place]) 09/03/2014  . Unspecified cerebral artery occlusion with cerebral infarction 07/05/2014  . Alcohol abuse 07/02/2014  . Current tobacco use 01/17/2014  . Hearing loss 11/25/2013  . Tinnitus of both ears 11/25/2013    Past Surgical History:  Procedure Laterality Date  . CARDIAC CATHETERIZATION N/A 01/15/2016   Procedure: Left Heart Cath and Coronary Angiography;  Surgeon: Burnell Blanks, MD;  Location: [facility];  Service: Cardiovascular;  Laterality: N/A;       Home Medications    Prior to Admission medications   Medication Sig Start Date End Date Taking? Authorizing Provider  aspirin 81 MG EC tablet Take 1 tablet (81 mg total) by mouth daily. 07/11/14  Yes Daniel J Angiulli, PA-C  atorvastatin (LIPITOR) 40 MG tablet Take 1 tablet (40 mg total) by mouth daily at 6 PM. 01/19/16  Yes Allie Bossier, MD  buPROPion [facility] SR) 200 MG 12  hr tablet Take 200 mg by mouth daily.   Yes Historical Provider, MD  carvedilol (COREG) 25 MG tablet Take 1 tablet (25 mg total) by mouth 2 (two) times daily with a meal. Patient taking differently: Take 12.5 mg by mouth 2 (two) times daily with a meal.  09/23/16  Yes Patrecia Pour, MD  cholecalciferol (VITAMIN D) 1000 units tablet Take 1,000 Units by mouth 2 (two) times daily.    Yes Historical Provider, MD  Coenzyme Q10 (CO Q-10) 200 MG CAPS Take 200 mg by mouth daily.   Yes Historical Provider, MD  folic acid (FOLVITE) 1 MG tablet take 1 tablet by mouth once daily 01/12/17  Yes Monina C Medina-Vargas, NP  hydrOXYzine (ATARAX/VISTARIL) 10 MG tablet Take 1 tablet (10 mg total) by mouth 3 (three) times daily as needed for anxiety. 05/13/16  Yes Velvet Bathe, MD  LORazepam (ATIVAN) 1 MG tablet Take 1 mg by mouth 2 (two) times daily as needed for anxiety.   Yes Historical Provider, MD  Magnesium 500 MG CAPS Take 500 mg by mouth at bedtime.   Yes Historical Provider, MD  mirtazapine (REMERON) 7.5 MG tablet take 1 tablet by mouth at bedtime 01/12/17  Yes Monina C Medina-Vargas, NP  nitroGLYCERIN (NITROSTAT) 0.4 MG SL tablet Place 1 tablet (0.4 mg total) under the tongue every 5 (five) minutes as needed for chest pain. 05/13/16  Yes Velvet Bathe, MD  Omega-3 Fatty Acids (FISH OIL) 1000 MG CAPS Take 1,000 mg by mouth 2 (two) times daily.    Yes Historical Provider, MD  potassium chloride SA (K-DUR,KLOR-CON) 20 MEQ tablet take 2 tablets by mouth once daily Patient taking differently: take 1 tablets by mouth once daily 01/12/17  Yes Monina C Medina-Vargas, NP  tamsulosin (FLOMAX) 0.4 MG CAPS capsule take 1 capsule by mouth once daily 01/12/17  Yes Monina C Medina-Vargas, NP  thiamine (VITAMIN B-1) 100 MG tablet Take 100 mg by mouth daily.   Yes Historical Provider, MD  Multiple Vitamin (MULTIVITAMIN WITH MINERALS) TABS tablet Take 1 tablet by mouth daily. Patient not taking: Reported on 02/15/2017 05/13/16   Velvet Bathe, MD    Family History Family History  Problem Relation Age of Onset  . Cancer Mother     esophageal  . Hypertension Mother   . Diabetes Mother   . Cancer Father     prostate  . Heart disease Maternal Grandfather     MI  . Cancer Paternal Grandfather     prostate    Social History Social History  Substance Use Topics  . Smoking status: Current Every Day Smoker    Packs/day: 0.50    Years: 30.00    Types: Cigarettes  . Smokeless tobacco: Never Used  . Alcohol use 33.6 oz/week    56 Shots of liquor per week     Comment: last drink yesterday     Allergies   Lisinopril and  Celebrex [celecoxib]   Review of Systems Review of Systems A complete 10 system review of systems was obtained and all systems are negative except as noted in the HPI and PMH.   Physical Exam Updated Vital Signs BP 153/97   Pulse 112   Temp 97.7 F (36.5 C) (Oral)   Resp (!) 31   Ht 5\' 10"  (1.778 m)   Wt 175 lb (79.4 kg)   SpO2 96%   BMI 25.11 kg/m   Physical Exam  Constitutional: He is oriented to person, place, and time. Vital signs are normal. He appears well-developed and well-nourished.  Non-toxic appearance. He does not appear ill. No distress.  Clinically intoxicated.  HENT:  Head: Normocephalic and atraumatic.  Nose: Nose normal.  Mouth/Throat: Oropharynx is clear and moist. No oropharyngeal exudate.  Eyes: Conjunctivae and EOM are normal. Pupils are equal, round, and reactive to light. No scleral icterus.  Neck: Normal range of motion. Neck supple. No tracheal deviation, no edema, no erythema and normal range of motion present. No thyroid mass and no thyromegaly present.  Cardiovascular: Regular rhythm, S1 normal, S2 normal, normal heart sounds, intact distal pulses and normal pulses.  Tachycardia present.  Exam reveals no gallop and no friction rub.   No murmur heard. Tachycardic   Pulmonary/Chest: Effort normal and breath sounds normal. No respiratory distress. He has no  wheezes. He has no rhonchi. He has no rales.  Left lower ribs TTP.  Abdominal: Soft. Normal appearance and bowel sounds are normal. He exhibits no distension, no ascites and no mass. There is no hepatosplenomegaly. There is no tenderness. There is no rebound, no guarding and no CVA tenderness.  Musculoskeletal: Normal range of motion. He exhibits no edema or tenderness.  Lymphadenopathy:    He has no cervical adenopathy.  Neurological: He is alert and oriented to person, place, and time. He has normal strength. No cranial nerve deficit or sensory deficit.  Skin: Skin is warm, dry and intact. No petechiae and no rash noted. He is not diaphoretic. No erythema. No pallor.  Nursing note and vitals reviewed.    ED Treatments / Results  DIAGNOSTIC STUDIES: Oxygen Saturation is 96% on RA, nl by my interpretation.    COORDINATION OF CARE: 1:50 AM Discussed treatment plan with pt at bedside which includes labs, UA, and pt agreed to plan.   Labs (all labs ordered are listed, but only abnormal results are displayed) Labs Reviewed  COMPREHENSIVE METABOLIC PANEL - Abnormal; Notable for the following:       Result Value   CO2 12 (*)    Glucose, Bld 128 (*)    BUN 23 (*)    AST 182 (*)    ALT 90 (*)    Total Bilirubin 1.8 (*)    Anion gap 25 (*)    All other components within normal limits  CBC  POC OCCULT BLOOD, ED  TYPE AND SCREEN    EKG  EKG Interpretation None       Radiology No results found.  Procedures Procedures (including critical care time)  Medications Ordered in ED Medications - No data to display   Initial Impression / Assessment and Plan / ED Course  I have reviewed the triage vital signs and the nursing notes.  Pertinent labs & imaging results that were available during my care of the patient were reviewed by me and considered in my medical decision making (see chart for details).    Patient presents to the ED for urinary  retention, fall in the bathroom  with L rib pain, then subsequently vomiting blood.  Patient can not state whether he coughed up the blood or vomited.  His hemoccult is negative which makes it more concerning for coughing up blood int he setting of trauma.  Will obtain CT scan to assess for severe injury. Labs are consistent with alcoholic liver disease.  He was given D5 IVF bolus to correct his alcoholic acidosis.  Will continue to observe.  Patient continues to be tachycardic, hypoxic.  Could be aspiration pneumonia vs alcohol withdrawal.  Will cover with abx.  Dr. Tamala Julian accepted for admission.  CIWA protocol started.   Final Clinical Impressions(s) / ED Diagnoses   Final diagnoses:  None    New Prescriptions New Prescriptions   No medications on file     I personally performed the services described in this documentation, which was scribed in my presence. The recorded information has been reviewed and is accurate.       Everlene Balls, MD 02/15/17 [phone]

## 2017-02-15 NOTE — ED Notes (Signed)
Unable to get second set of BC's despite several attempts by both RN Learta Codding and NT Megan.  Will alert hospitalist.

## 2017-02-15 NOTE — Progress Notes (Signed)
Pharmacy Antibiotic Note  Joshua Henson is a 71 y.o. male admitted on 02/14/2017 with pneumonia.  Pharmacy has been consulted for Zosyn dosing.  Plan: Zosyn 3.375g IV q8h (4 hour infusion).  No dose adjustments anticipated- pharmacy to sign off & monitor peripherally via survelience software.  Please re-consult if needed  Height: 5\' 10"  (177.8 cm) Weight: 190 lb 14.7 oz (86.6 kg) IBW/kg (Calculated) : 73  Temp (24hrs), Avg:98.3 F (36.8 C), Min:97.7 F (36.5 C), Max:98.9 F (37.2 C)   Recent Labs Lab 02/15/17 0034  WBC 10.4  CREATININE 1.01    Estimated Creatinine Clearance: 71.3 mL/min (by C-G formula based on SCr of 1.01 mg/dL).    Allergies  Allergen Reactions  . Lisinopril     syncope  . Celebrex [Celecoxib] Rash    Antimicrobials this admission: 2/28 Zosyn >>  2/28 Levaquin x1 in ED  Dose adjustments this admission:   Microbiology results: 2/28 BCx: sent 2/28 MRSA PCR: sent  Thank you for allowing pharmacy to be a part of this patient's care.  Netta Cedars, PharmD, BCPS Pager: [phone] 02/15/2017 9:57 AM

## 2017-02-15 NOTE — ED Notes (Signed)
[facility] CONTACT INFORMATION [phone]

## 2017-02-15 NOTE — Progress Notes (Signed)
71 year old male with pmh HTN, alcohol abuse, alcohol cirrhosis of the liver, hepatic encephalopathy; who presents after a fall reportedly having hematemesis. VS temp- 97.74F, pulse 90-116, respirations 21-31, BP maintained, O2 saturations 91-100%.  Lab work reveals hemoglobin 16.7, BUN 23, Cr 1.01, lipase 1399, AST 182, ALT 90.  CT scan of the chest reveals left rib fracture 2/2 fall, possible aspiration pneumonia, and pancreatitis. Empiric antibiotics of Levaquin given. CWIA Will need serial H&H monitoring and likely GI consult. TRH called to admit inpatient admission to telemetry bed.

## 2017-02-15 NOTE — ED Notes (Signed)
Delay in transfer, pharmacy called for verification on ativan. Will medicated with ativan before transfer. Report given by Night RN Caryl Comes to 4th floor.
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
## 2017-02-15 NOTE — ED Notes (Signed)
PT STATED LAST DRINK 2 DAYS AGO

## 2017-02-15 NOTE — ED Notes (Signed)
Bladder scan showed 456 ml.

## 2017-02-15 NOTE — Care Management Note (Signed)
Case Management Note  Patient Details  Name: Joshua Henson MRN: [id number] Date of Birth: 1946/07/09  Subjective/Objective:      Case followed by medicare              Action/Plan: Date:  February 14, 2017 Chart reviewed for concurrent status and case management needs. Will continue to follow patient progress. Discharge Planning: following for needs Expected discharge date: [id number] [name], BSN, [place], [place]  Expected Discharge Date:   (unknown)               Expected Discharge Plan:  Home/Self Care  In-House Referral:     Discharge planning Services     Post Acute Care Choice:    Choice offered to:     DME Arranged:    DME Agency:     HH Arranged:    [place] Agency:     Status of Service:  Completed, signed off  If discussed at [employer] of Stay Meetings, dates discussed:    Additional Comments:  Leeroy Cha, RN 02/15/2017, 12:20 PM

## 2017-02-16 DIAGNOSIS — F10231 Alcohol dependence with withdrawal delirium: Secondary | ICD-10-CM

## 2017-02-16 DIAGNOSIS — K8521 Alcohol induced acute pancreatitis with uninfected necrosis: Secondary | ICD-10-CM

## 2017-02-16 LAB — CBC
HEMATOCRIT: 36 % — AB (ref 39.0–52.0)
HEMOGLOBIN: 12.8 g/dL — AB (ref 13.0–17.0)
MCH: 33.5 pg (ref 26.0–34.0)
MCHC: 35.6 g/dL (ref 30.0–36.0)
MCV: 94.2 fL (ref 78.0–100.0)
Platelets: 87 10*3/uL — ABNORMAL LOW (ref 150–400)
RBC: 3.82 MIL/uL — AB (ref 4.22–5.81)
RDW: 14.7 % (ref 11.5–15.5)
WBC: 8.9 10*3/uL (ref 4.0–10.5)

## 2017-02-16 LAB — COMPREHENSIVE METABOLIC PANEL
ALBUMIN: 3.5 g/dL (ref 3.5–5.0)
ALT: 60 U/L (ref 17–63)
ANION GAP: 10 (ref 5–15)
AST: 128 U/L — ABNORMAL HIGH (ref 15–41)
Alkaline Phosphatase: 69 U/L (ref 38–126)
BUN: 21 mg/dL — ABNORMAL HIGH (ref 6–20)
CHLORIDE: 110 mmol/L (ref 101–111)
CO2: 21 mmol/L — AB (ref 22–32)
Calcium: 8.5 mg/dL — ABNORMAL LOW (ref 8.9–10.3)
Creatinine, Ser: 0.87 mg/dL (ref 0.61–1.24)
GFR calc non Af Amer: >60 mL/min (ref >60)
Glucose, Bld: 85 mg/dL (ref 65–99)
Potassium: 3.2 mmol/L — ABNORMAL LOW (ref 3.5–5.1)
SODIUM: 141 mmol/L (ref 135–145)
Total Bilirubin: 2 mg/dL — ABNORMAL HIGH (ref 0.3–1.2)
Total Protein: 6.1 g/dL — ABNORMAL LOW (ref 6.5–8.1)

## 2017-02-16 MED ORDER — HYDRALAZINE HCL 20 MG/ML IJ SOLN
10.0000 mg | Freq: Once | INTRAMUSCULAR | Status: AC
  Administered 2017-02-16: 10 mg via INTRAVENOUS
  Filled 2017-02-16: qty 1

## 2017-02-16 MED ORDER — LABETALOL HCL 5 MG/ML IV SOLN
10.0000 mg | INTRAVENOUS | Status: DC | PRN
  Administered 2017-02-16 (×2): 10 mg via INTRAVENOUS
  Filled 2017-02-16 (×2): qty 4

## 2017-02-16 MED ORDER — HYDRALAZINE HCL 20 MG/ML IJ SOLN
10.0000 mg | INTRAMUSCULAR | Status: DC | PRN
  Administered 2017-02-16 – 2017-02-21 (×9): 10 mg via INTRAVENOUS
  Filled 2017-02-16 (×9): qty 1

## 2017-02-16 MED ORDER — POTASSIUM CHLORIDE CRYS ER 20 MEQ PO TBCR
40.0000 meq | EXTENDED_RELEASE_TABLET | ORAL | Status: AC
  Administered 2017-02-16 (×2): 40 meq via ORAL
  Filled 2017-02-16 (×2): qty 2

## 2017-02-16 MED ORDER — LORAZEPAM 2 MG/ML IJ SOLN
1.0000 mg | INTRAMUSCULAR | Status: DC | PRN
  Administered 2017-02-18 (×3): 1 mg via INTRAVENOUS
  Administered 2017-02-19 – 2017-02-20 (×12): 2 mg via INTRAVENOUS
  Filled 2017-02-16 (×15): qty 1

## 2017-02-16 MED ORDER — DEXMEDETOMIDINE HCL IN NACL 200 MCG/50ML IV SOLN
0.2000 ug/kg/h | INTRAVENOUS | Status: AC
  Administered 2017-02-16: 0.5 ug/kg/h via INTRAVENOUS
  Administered 2017-02-16: 0.2 ug/kg/h via INTRAVENOUS
  Administered 2017-02-16 – 2017-02-17 (×3): 0.5 ug/kg/h via INTRAVENOUS
  Filled 2017-02-16 (×5): qty 50

## 2017-02-16 MED ORDER — LORAZEPAM 2 MG/ML IJ SOLN
2.0000 mg | INTRAMUSCULAR | Status: DC | PRN
  Administered 2017-02-16 (×3): 3 mg via INTRAVENOUS
  Administered 2017-02-16: 2 mg via INTRAVENOUS
  Filled 2017-02-16 (×3): qty 2
  Filled 2017-02-16: qty 1

## 2017-02-16 MED ORDER — MORPHINE SULFATE (PF) 4 MG/ML IV SOLN
2.0000 mg | INTRAVENOUS | Status: DC | PRN
  Administered 2017-02-16 – 2017-02-20 (×11): 2 mg via INTRAVENOUS
  Filled 2017-02-16 (×11): qty 1

## 2017-02-16 MED ORDER — LABETALOL HCL 5 MG/ML IV SOLN: 10.0000 mg | INTRAVENOUS | Status: DC | PRN

## 2017-02-16 NOTE — Progress Notes (Signed)
RN notified Tylene Fantasia, NP of Hydralazine administration at 0300 and pt 0430 BP of 185/83. NP ordered a second dose of 10mg  Hydralazine IV push.

## 2017-02-16 NOTE — Progress Notes (Signed)
3mg  ativan IV not affecting patient at all. Pt still restless and agitated.

## 2017-02-16 NOTE — Progress Notes (Signed)
Paged Dr. Maylene Roes about pt having little result with 11mg  ativan in the past 4 hours.

## 2017-02-16 NOTE — Progress Notes (Addendum)
PROGRESS NOTE    Joshua Henson  [id number] DOB: 1946/03/26 DOA: 02/14/2017 PCP: Mayra Neer, MD     Brief Narrative:  Joshua Henson is a 71 y.o.malewith medical history significant of alcohol abuse, stroke, NSTEMI, hypertension, depression, anxiety, alcohol cirrhosis, hepatic encephalopathy, who presents after falls and intractable nausea vomiting. Patient brought in via EMS. Initially there was a suspicion of coffee-ground emesis. Patient is alcoholic and admits to drinking a pint of vodka everyday. He started feeling bad 2 days ago after he lost his balance and fell in the bathroom. Subsequently he developed left-sided chest wall pain. As per ED notes patient was again on the floor around 5 PM yesterday when apparently he fell out of the bed. Patient has not taken any of his regular medications for the last 4 days because of excessive alcohol use. Patient also complained of urinary retention, and unable to urinate. Patient denied any abdominal pain or shortness of breath. Workup in the emergency department revealed acute alcoholic pancreatitis.  Assessment & Plan:   Principal Problem:   Acute pancreatitis Active Problems:   Alcohol abuse   Alcohol withdrawal, with delirium (HCC)   Left rib fracture   Chronic alcohol abuse   Delirium tremens (HCC)   Hypokalemia   Essential hypertension   Alcohol withdrawal delirium (HCC)  Acute alcoholic pancreatitis -Seen on CT scan as well as elevated lipase. Currently, patient denying any abdominal pain and abdomen is not tender to palpation. We will continue IV fluids and slowly advance diet.  Aspiration pneumonitis  -Initially concerned for aspiration pneumonia and was started on empiric Zosyn. Patient denies any cough, has been afebrile in the hospital as well as normal white blood cell count. Discontinue Zosyn and continue to monitor for now. -Blood cultures pending   Alcohol withdrawal with delirium  -CIWA stepdown protocol    -Folvite, thiamine   Hypokalemia -Replace and trend   Essential hypertension -Continue Coreg, hydralazine/labetalol prn   History of depression -Continue Wellbutrin, Vistaril  Urinary retention -Foley   Ascending thoracic aortic aneurysm -Measuring 4.1 cm, this will need outpatient follow-up PCP. Outpatient thoracic surgery evaluation recommended  Displaced left anterolateral seventh rib fracture -Pain control    DVT prophylaxis: lovenox Code Status: full Family Communication: no family at bedside Disposition Plan: pending further improvement and stabilization    Consultants:   None  Procedures:   None  Antimicrobials:  Anti-infectives    Start     Dose/Rate Route Frequency Ordered Stop   02/15/17 1600  piperacillin-tazobactam (ZOSYN) IVPB 3.375 g     3.375 g 12.5 mL/hr over 240 Minutes Intravenous Every 8 hours 02/15/17 1010     02/15/17 0815  piperacillin-tazobactam (ZOSYN) IVPB 3.375 g     3.375 g 100 mL/hr over 30 Minutes Intravenous  Once 02/15/17 0812 02/15/17 0956   02/15/17 0615  levofloxacin (LEVAQUIN) IVPB 750 mg     750 mg 100 mL/hr over 90 Minutes Intravenous  Once 02/15/17 0602 02/15/17 1901         Subjective: Patient is quite confused this morning. He is oriented to self only and asks how I got into his house today. He is able to answer some questions appropriately, however. He denies any abdominal pain or nausea or vomiting. He states that his main pain consists of left shoulder and left rib pain from when he fell. He also admits to drinking about a pint of alcohol daily since he was 71 years old. He then thanks me for seeing him  today and asks if he needs to make an appointment to see me because Friday will not work well for him, he is hoping for Monday.    Objective: Vitals:   02/16/17 1000 02/16/17 1030 02/16/17 1100 02/16/17 1121  BP: (!) 154/75 (!) 131/101 (!) 157/86   Pulse: 92 93 86 85  Resp: (!) 28 (!) 23 (!) 27   Temp:   98.4 F (36.9 C)    TempSrc:  Oral    SpO2: 93% 90% 91%   Weight:      Height:        Intake/Output Summary (Last 24 hours) at 02/16/17 1207 Last data filed at 02/16/17 0600  Gross per 24 hour  Intake          2390.83 ml  Output              275 ml  Net          2115.83 ml   Filed Weights   02/15/17 0001 02/15/17 0900  Weight: 79.4 kg (175 lb) 86.6 kg (190 lb 14.7 oz)    Examination:  General exam: Appears calm and comfortable, confused  Respiratory system: Clear to auscultation. Respiratory effort normal. Cardiovascular system: S1 & S2 heard, RRR. No JVD, murmurs, rubs, gallops or clicks. No pedal edema. Gastrointestinal system: Abdomen is nondistended, soft and nontender to palpation. No organomegaly or masses felt. Normal bowel sounds heard. Central nervous system: Alert and oriented to self only, answers some questions appropriately  Extremities: Symmetric 5 x 5 power. Skin: No rashes, lesions or ulcers  Data Reviewed: I have personally reviewed following labs and imaging studies  CBC:  Recent Labs Lab 02/15/17 0034 02/16/17 0601  WBC 10.4 8.9  HGB 16.7 12.8*  HCT 47.3 36.0*  MCV 93.3 94.2  PLT 154 87*   Basic Metabolic Panel:  Recent Labs Lab 02/15/17 0034 02/15/17 0435 02/16/17 0344  NA 139  --  141  K 4.6  --  3.2*  CL 102  --  110  CO2 12*  --  21*  GLUCOSE 128*  --  85  BUN 23*  --  21*  CREATININE 1.01  --  0.87  CALCIUM 8.9  --  8.5*  MG  --  2.2  --    GFR: Estimated Creatinine Clearance: 82.7 mL/min (by C-G formula based on SCr of 0.87 mg/dL). Liver Function Tests:  Recent Labs Lab 02/15/17 0034 02/16/17 0344  AST 182* 128*  ALT 90* 60  ALKPHOS 94 69  BILITOT 1.8* 2.0*  PROT 7.7 6.1*  ALBUMIN 4.9 3.5    Recent Labs Lab 02/15/17 0034  LIPASE 1,399*   No results for input(s): AMMONIA in the last 168 hours. Coagulation Profile: No results for input(s): INR, PROTIME in the last 168 hours. Cardiac Enzymes: No results for  input(s): CKTOTAL, CKMB, CKMBINDEX, TROPONINI in the last 168 hours. BNP (last 3 results) No results for input(s): PROBNP in the last 8760 hours. HbA1C: No results for input(s): HGBA1C in the last 72 hours. CBG: No results for input(s): GLUCAP in the last 168 hours. Lipid Profile: No results for input(s): CHOL, HDL, LDLCALC, TRIG, CHOLHDL, LDLDIRECT in the last 72 hours. Thyroid Function Tests: No results for input(s): TSH, T4TOTAL, FREET4, T3FREE, THYROIDAB in the last 72 hours. Anemia Panel: No results for input(s): VITAMINB12, FOLATE, FERRITIN, TIBC, IRON, RETICCTPCT in the last 72 hours. Sepsis Labs: No results for input(s): PROCALCITON, LATICACIDVEN in the last 168 hours.  Recent Results (from the  past 240 hour(s))  Culture, blood (Routine X 2) w Reflex to ID Panel     Status: None (Preliminary result)   Collection Time: 02/15/17  7:15 AM  Result Value Ref Range Status   Specimen Description BLOOD RIGHT WRIST  Final   Special Requests IN PEDIATRIC BOTTLE 3CC  Final   Culture   Final    NO GROWTH 1 DAY Performed at [facility], [place] [address], [place], [place] [address]    Report Status PENDING  Incomplete  MRSA PCR Screening     Status: None   Collection Time: 02/15/17  8:43 AM  Result Value Ref Range Status   MRSA by PCR NEGATIVE NEGATIVE Final    Comment:        The GeneXpert MRSA Assay (FDA approved for NASAL specimens only), is one component of a comprehensive MRSA colonization surveillance program. It is not intended to diagnose MRSA infection nor to guide or monitor treatment for MRSA infections.   Culture, blood (Routine X 2) w Reflex to ID Panel     Status: None (Preliminary result)   Collection Time: 02/15/17  9:09 AM  Result Value Ref Range Status   Specimen Description BLOOD LEFT ARM  Final   Special Requests BOTTLES DRAWN AEROBIC AND ANAEROBIC 7 CC  Final   Culture   Final    NO GROWTH 1 DAY Performed at [facility], [address], [place], [place] [address]    Report Status PENDING  Incomplete       Radiology Studies: Ct Chest W Contrast  Result Date: 02/15/2017 CLINICAL DATA:  Status post fall out of bed, with left rib pain. Hemoptysis or hematemesis. Mild leukocytosis. Initial encounter. EXAM: CT CHEST WITH CONTRAST TECHNIQUE: Multidetector CT imaging of the chest was performed during intravenous contrast administration. CONTRAST:  75 mL of Isovue 300 IV contrast COMPARISON:  Chest radiograph performed 09/20/2016 FINDINGS: Cardiovascular: Scattered coronary artery calcifications are seen. There is borderline dilatation of the ascending thoracic aorta to 4.1 cm in AP dimension. Scattered calcification is noted along the descending thoracic aorta. The heart remains normal in size. The great vessels are grossly unremarkable in appearance, aside from minimal calcification along the proximal right subclavian artery. Mediastinum/Nodes: No mediastinal lymphadenopathy is seen. No pericardial effusion is identified. Mild nonspecific soft tissue inflammation is noted at the left epicardial fat pad. The thyroid gland is unremarkable. No axillary lymphadenopathy is seen. Lungs/Pleura: Patchy bibasilar airspace opacities may reflect atelectasis or mild pneumonia. The lungs are otherwise clear. No pleural effusion or pneumothorax is seen. No mass is identified. A calcified granuloma is noted at the right lung base. Upper Abdomen: There is diffuse fatty infiltration within the liver. Diffuse fluid and edema are seen tracking about the entirety of the pancreas and the second segment of the duodenum, suspicious for acute pancreatitis. There is no definite evidence of devascularization or pseudocyst formation at this time. Scattered calcified granulomata are seen within the spleen. Nonspecific perinephric stranding is noted bilaterally. A small right renal cyst is seen. Musculoskeletal: There is a mildly displaced left anterolateral seventh rib  fracture. Additional chronic bilateral rib deformities are noted. The visualized musculature is unremarkable in appearance. IMPRESSION: 1. Mildly displaced left anterolateral seventh rib fracture. 2. Diffuse fluid and edema tracking about the entirety of the pancreas and the second segment of the duodenum, suspicious for acute pancreatitis. No definite evidence of devascularization or pseudocyst formation at this time. 3. Patchy bibasilar airspace opacities may reflect  atelectasis or mild pneumonia. 4. Borderline aneurysmal dilatation of the ascending thoracic aorta to 4.1 cm in AP dimension. Recommend annual imaging followup by CTA or MRA. This recommendation follows 2010 ACCF/AHA/AATS/ACR/ASA/SCA/SCAI/SIR/STS/SVM Guidelines for the Diagnosis and Management of Patients with Thoracic Aortic Disease. Circulation. 2010; 121: e266-e369 5. Scattered coronary artery calcifications seen. 6. Mild nonspecific soft tissue inflammation at the left epicardial fat pad. 7. Diffuse fatty infiltration within the liver. 8. Small right renal cyst seen. These results were called by telephone at the time of interpretation on 02/15/2017 at 4:33 am to Dr. Everlene Balls, who verbally acknowledged these results. Electronically Signed   By: Garald Balding M.D.   On: 02/15/2017 04:35      Scheduled Meds: . buPROPion  200 mg Oral Daily  . carvedilol  12.5 mg Oral BID  . folic acid  1 mg Oral Daily  . mouth rinse  15 mL Mouth Rinse BID  . mirtazapine  7.5 mg Oral QHS  . multivitamin with minerals  1 tablet Oral Daily  . pantoprazole  40 mg Intravenous QHS  . piperacillin-tazobactam (ZOSYN)  IV  3.375 g Intravenous Q8H  . potassium chloride  40 mEq Oral Q4H  . sodium chloride flush  3 mL Intravenous Q12H  . tamsulosin  0.4 mg Oral Daily  . thiamine  100 mg Oral Daily   Continuous Infusions: . sodium chloride 150 mL/hr at 02/16/17 0600     LOS: 1 day    Time spent: 40 minutes   Dessa Phi, DO [facility] [email or web address] Password [facility] 02/16/2017, 12:07 PM

## 2017-02-16 NOTE — Progress Notes (Signed)
RN notified by lab technician that one of the lab tubes had hemolyzed. Lab technician stated she would notify phlebotomist. Will continue to monitor.

## 2017-02-16 NOTE — Consult Note (Signed)
PULMONARY / CRITICAL CARE MEDICINE   Name: Joshua Henson MRN: [id number] DOB: Jul 18, 1946  02/16/2017  ADMISSION DATE:  02/14/2017 CONSULTATION DATE:  02/16/2017  REFERRING MD:  Dr. Maylene Roes  CHIEF COMPLAINT:  Abdominal pain, alcohol withdrawal  HISTORY OF PRESENT ILLNESS:   This is a 71 year old male with a past medical history significant for alcohol abuse, alcoholic cirrhosis who was admitted to [facility] long hospital on 02/14/2017 with a chief complaint of abdominal pain. He reported a fall a few days prior to admission. He admitted to drinking up to 1 pint of vodka daily. He apparently fell in his bathroom and landed on his left side and was complaining of some left-sided chest pain. He also noted several days of nausea and vomiting and inability to take his medications. He was admitted by the [facility] and treated for aspiration pneumonia which was seen on a CT scan of his chest in the emergency room department. He was also found on lab work and imaging to have evidence of acute alcoholic pancreatitis. Pulmonary and critical care medicine was consulted on 02/16/2017 in the setting of alcohol withdrawal. His nurses note that he has been restless, trying to get out of bed, and has required is much as 11 mg of Ativan and 4 hours.  PAST MEDICAL HISTORY :  He  has a past medical history of Acute hepatic encephalopathy; Alcoholic cirrhosis of liver without ascites ([place]); Anxiety state; CAD (coronary artery disease), native coronary artery (01/10/2016); Depression; Fatty liver, alcoholic ([id number]); Hypertension; Increased ammonia level; Insomnia; NSTEMI (non-ST elevated myocardial infarction) ([place]); Prostate cancer ([place]) (2010); Stroke [facility]); and Tachycardia.  PAST SURGICAL HISTORY: He  has a past surgical history that includes Cardiac catheterization (N/A, 01/15/2016).  Allergies  Allergen Reactions  . Lisinopril     syncope  . Celebrex [Celecoxib] Rash    No current  facility-administered medications on file prior to encounter.    Current Outpatient Prescriptions on File Prior to Encounter  Medication Sig  . aspirin 81 MG EC tablet Take 1 tablet (81 mg total) by mouth daily.  Marland Kitchen atorvastatin (LIPITOR) 40 MG tablet Take 1 tablet (40 mg total) by mouth daily at 6 PM.  . carvedilol (COREG) 25 MG tablet Take 1 tablet (25 mg total) by mouth 2 (two) times daily with a meal. (Patient taking differently: Take 12.5 mg by mouth 2 (two) times daily with a meal. )  . cholecalciferol (VITAMIN D) 1000 units tablet Take 1,000 Units by mouth 2 (two) times daily.   . Coenzyme Q10 (CO Q-10) 200 MG CAPS Take 200 mg by mouth daily.  . folic acid (FOLVITE) 1 MG tablet take 1 tablet by mouth once daily  . hydrOXYzine (ATARAX/VISTARIL) 10 MG tablet Take 1 tablet (10 mg total) by mouth 3 (three) times daily as needed for anxiety.  Marland Kitchen LORazepam (ATIVAN) 1 MG tablet Take 1 mg by mouth 2 (two) times daily as needed for anxiety.  . Magnesium 500 MG CAPS Take 500 mg by mouth at bedtime.  . mirtazapine (REMERON) 7.5 MG tablet take 1 tablet by mouth at bedtime  . nitroGLYCERIN (NITROSTAT) 0.4 MG SL tablet Place 1 tablet (0.4 mg total) under the tongue every 5 (five) minutes as needed for chest pain.  . Omega-3 Fatty Acids (FISH OIL) 1000 MG CAPS Take 1,000 mg by mouth 2 (two) times daily.   . potassium chloride SA (K-DUR,KLOR-CON) 20 MEQ tablet take 2 tablets by mouth once daily (Patient taking differently: take 1 tablets  by mouth once daily)  . tamsulosin (FLOMAX) 0.4 MG CAPS capsule take 1 capsule by mouth once daily  . thiamine (VITAMIN B-1) 100 MG tablet Take 100 mg by mouth daily.  . Multiple Vitamin (MULTIVITAMIN WITH MINERALS) TABS tablet Take 1 tablet by mouth daily. (Patient not taking: Reported on 02/15/2017)    FAMILY HISTORY/SOCIAL HISTORY/REVIEW OF SYSTEMS:   Cannot obtain due to confusion  SUBJECTIVE:  As above  VITAL SIGNS: BP (!) 174/97   Pulse 88   Temp 98.4 F  (36.9 C) (Oral)   Resp (!) 29   Ht 5\' 10"  (1.778 m)   Wt 190 lb 14.7 oz (86.6 kg)   SpO2 90%   BMI 27.39 kg/m   HEMODYNAMICS:    VENTILATOR SETTINGS:    INTAKE / OUTPUT: I/O last 3 completed shifts: In: 3243.8 [I.V.:2943.8; IV Piggyback:300] Out: 275 [Urine:275]  PHYSICAL EXAMINATION: General:  Disheveled male, lying in bed Neuro:  Eyes closed, only will open eyes to abdominal exam, does not follow commands, no speech, moves all 4 extremities spontaneously HEENT:  Normocephalic atraumatic, dry mucous membranes Cardiovascular:  Regular rate and rhythm no murmurs gallops or rubs Lungs:  Few rhonchi bilaterally but normal effort Abdomen:  Mildly tender epigastrium, slight distention Musculoskeletal:  Normal bulk and tone Skin:  Dry skin, some scattered extensor surface bruises, no rash or skin breakdown otherwise  LABS:  BMET  Recent Labs Lab 02/15/17 0034 02/16/17 0344  NA 139 141  K 4.6 3.2*  CL 102 110  CO2 12* 21*  BUN 23* 21*  CREATININE 1.01 0.87  GLUCOSE 128* 85    Electrolytes  Recent Labs Lab 02/15/17 0034 02/15/17 0435 02/16/17 0344  CALCIUM 8.9  --  8.5*  MG  --  2.2  --     CBC  Recent Labs Lab 02/15/17 0034 02/16/17 0601  WBC 10.4 8.9  HGB 16.7 12.8*  HCT 47.3 36.0*  PLT 154 87*    Coag's No results for input(s): APTT, INR in the last 168 hours.  Sepsis Markers No results for input(s): LATICACIDVEN, PROCALCITON, O2SATVEN in the last 168 hours.  ABG No results for input(s): PHART, PCO2ART, PO2ART in the last 168 hours.  Liver Enzymes  Recent Labs Lab 02/15/17 0034 02/16/17 0344  AST 182* 128*  ALT 90* 60  ALKPHOS 94 69  BILITOT 1.8* 2.0*  ALBUMIN 4.9 3.5    Cardiac Enzymes No results for input(s): TROPONINI, PROBNP in the last 168 hours.  Glucose No results for input(s): GLUCAP in the last 168 hours.  Imaging No results found.   STUDIES:  CT chest 02/15/2017 showing bi-basilar tree-in-bud opacities, fluid  around the pancreas with pancreatic inflammation, abdominal aortic diameter 4.1 cm, images independently reviewed by me.  CULTURES: February 28 blood culture  ANTIBIOTICS: February 28 Zosyn February 28 vancomycin  SIGNIFICANT EVENTS: 02/16/2017 pulmonary and critical care consultation  LINES/TUBES:   DISCUSSION: This is a 71 year old male who is admitted for aspiration pneumonia and acute alcoholic pancreatitis who is now undergoing treatment for delirium tremens. Despite treatment with as needed benzodiazepine use his symptoms have worsened and he requires ICU level care.  ASSESSMENT / PLAN:  PULMONARY A: Aspiration pneumonia P:   Nothing by mouth Continue antibiotics as you're doing   GASTROINTESTINAL A:   Acute alcoholic pancreatitis P:   Nothing by mouth Pain control per [facility] Continue IV fluids  NEUROLOGIC A:   Acute encephalopathy secondary to alcohol withdrawal with delirium P:   RASS goal: 0  to -1 Precedex infusion per alcohol withdrawal ICU protocol Continue as needed Ativan Thiamine, folate   FAMILY  - Updates: none bedside  - Inter-disciplinary family meet or Palliative Care meeting due by:  day 7  My CCM time 35 minutes  Roselie Awkward, MD [place] [facility] Pager: [phone] Cell: [phone] After 3pm or if no response, call [phone] 02/16/2017, 2:38 PM

## 2017-02-17 DIAGNOSIS — F1029 Alcohol dependence with unspecified alcohol-induced disorder: Secondary | ICD-10-CM

## 2017-02-17 DIAGNOSIS — F1010 Alcohol abuse, uncomplicated: Secondary | ICD-10-CM

## 2017-02-17 DIAGNOSIS — K852 Alcohol induced acute pancreatitis without necrosis or infection: Principal | ICD-10-CM

## 2017-02-17 LAB — BASIC METABOLIC PANEL
ANION GAP: 9 (ref 5–15)
BUN: 14 mg/dL (ref 6–20)
CO2: 21 mmol/L — AB (ref 22–32)
Calcium: 7.9 mg/dL — ABNORMAL LOW (ref 8.9–10.3)
Chloride: 113 mmol/L — ABNORMAL HIGH (ref 101–111)
Creatinine, Ser: 0.59 mg/dL — ABNORMAL LOW (ref 0.61–1.24)
GFR calc Af Amer: >60 mL/min (ref >60)
GLUCOSE: 81 mg/dL (ref 65–99)
POTASSIUM: 3.2 mmol/L — AB (ref 3.5–5.1)
Sodium: 143 mmol/L (ref 135–145)

## 2017-02-17 LAB — CBC WITH DIFFERENTIAL/PLATELET
BASOS ABS: 0 10*3/uL (ref 0.0–0.1)
Basophils Relative: 0 %
Eosinophils Absolute: 0.1 10*3/uL (ref 0.0–0.7)
Eosinophils Relative: 1 %
HEMATOCRIT: 32.1 % — AB (ref 39.0–52.0)
Hemoglobin: 11.3 g/dL — ABNORMAL LOW (ref 13.0–17.0)
LYMPHS PCT: 13 %
Lymphs Abs: 0.8 10*3/uL (ref 0.7–4.0)
MCH: 32.9 pg (ref 26.0–34.0)
MCHC: 35.2 g/dL (ref 30.0–36.0)
MCV: 93.6 fL (ref 78.0–100.0)
Monocytes Absolute: 0.6 10*3/uL (ref 0.1–1.0)
Monocytes Relative: 9 %
NEUTROS ABS: 5.1 10*3/uL (ref 1.7–7.7)
Neutrophils Relative %: 78 %
Platelets: 75 10*3/uL — ABNORMAL LOW (ref 150–400)
RBC: 3.43 MIL/uL — AB (ref 4.22–5.81)
RDW: 14.7 % (ref 11.5–15.5)
WBC: 6.5 10*3/uL (ref 4.0–10.5)

## 2017-02-17 MED ORDER — IPRATROPIUM-ALBUTEROL 0.5-2.5 (3) MG/3ML IN SOLN
3.0000 mL | Freq: Four times a day (QID) | RESPIRATORY_TRACT | Status: DC
  Administered 2017-02-17 (×3): 3 mL via RESPIRATORY_TRACT
  Filled 2017-02-17 (×5): qty 3

## 2017-02-17 MED ORDER — POTASSIUM CHLORIDE CRYS ER 20 MEQ PO TBCR
40.0000 meq | EXTENDED_RELEASE_TABLET | ORAL | Status: AC
  Administered 2017-02-17 (×2): 40 meq via ORAL
  Filled 2017-02-17 (×2): qty 2

## 2017-02-17 NOTE — Progress Notes (Signed)
PULMONARY / CRITICAL CARE MEDICINE   Name: KYREEM LAWVER MRN: [id number] DOB: [birth year]  02/16/2017  ADMISSION DATE:  02/14/2017 CONSULTATION DATE:  02/16/2017  REFERRING MD:  Dr. Maylene Roes  CHIEF COMPLAINT:  Abdominal pain, alcohol withdrawal  HISTORY OF PRESENT ILLNESS:   This is a 71 year old male with a past medical history significant for alcohol abuse, alcoholic cirrhosis who was admitted to [facility] hospital on 02/14/2017 with a chief complaint of abdominal pain. He reported a fall a few days prior to admission. He admitted to drinking up to 1 pint of vodka daily. He apparently fell in his bathroom and landed on his left side and was complaining of some left-sided chest pain. He also noted several days of nausea and vomiting and inability to take his medications. He was admitted by the [facility] and treated for aspiration pneumonia which was seen on a CT scan of his chest in the emergency room department. He was also found on lab work and imaging to have evidence of acute alcoholic pancreatitis. Pulmonary and critical care medicine was consulted on 02/16/2017 in the setting of alcohol withdrawal.    SUBJECTIVE:  Less agitation last 24 hrs.  On precedex drip >> rate decreased this am.  UO is picking up. Tolerating clear diet   VITAL SIGNS: BP (!) 165/80   Pulse 76   Temp 98.6 F (37 C) (Oral)   Resp (!) 23   Ht 5\' 10"  (1.778 m)   Wt 86.6 kg (190 lb 14.7 oz)   SpO2 91%   BMI 27.39 kg/m   HEMODYNAMICS:    VENTILATOR SETTINGS:    INTAKE / OUTPUT: I/O last 3 completed shifts: In: 5317.3 [I.V.:5267.3; IV Piggyback:50] Out: [id number]  PHYSICAL EXAMINATION: General:  Disheveled male, lying in bed. Asleep, arousable. NAD.  Neuro:  CN grossly intact. (-) lateralizing signs. Oriented x 3 HEENT:  Normocephalic atraumatic, dry mucous membranes Cardiovascular:  Regular rate and rhythm no murmurs gallops or rubs Lungs:  Good ae.  Rhonchi in BULF which  decrease with coughing.  Abdomen:  (+) BS. Soft, non tended Musculoskeletal:  Normal bulk and tone Skin:  Dry skin, some scattered extensor surface bruises, no rash or skin breakdown otherwise  LABS:  BMET  Recent Labs Lab 02/15/17 0034 02/16/17 0344 02/17/17 0357  NA 139 141 143  K 4.6 3.2* 3.2*  CL 102 110 113*  CO2 12* 21* 21*  BUN 23* 21* 14  CREATININE 1.01 0.87 0.59*  GLUCOSE 128* 85 81    Electrolytes  Recent Labs Lab 02/15/17 0034 02/15/17 0435 02/16/17 0344 02/17/17 0357  CALCIUM 8.9  --  8.5* 7.9*  MG  --  2.2  --   --     CBC  Recent Labs Lab 02/15/17 0034 02/16/17 0601 02/17/17 0357  WBC 10.4 8.9 6.5  HGB 16.7 12.8* 11.3*  HCT 47.3 36.0* 32.1*  PLT 154 87* 75*    Coag's No results for input(s): APTT, INR in the last 168 hours.  Sepsis Markers No results for input(s): LATICACIDVEN, PROCALCITON, O2SATVEN in the last 168 hours.  ABG No results for input(s): PHART, PCO2ART, PO2ART in the last 168 hours.  Liver Enzymes  Recent Labs Lab 02/15/17 0034 02/16/17 0344  AST 182* 128*  ALT 90* 60  ALKPHOS 94 69  BILITOT 1.8* 2.0*  ALBUMIN 4.9 3.5    Cardiac Enzymes No results for input(s): TROPONINI, PROBNP in the last 168 hours.  Glucose No results for input(s): GLUCAP in  the last 168 hours.  Imaging No results found.   STUDIES:  CT chest 02/15/2017 showing bi-basilar tree-in-bud opacities, fluid around the pancreas with pancreatic inflammation, abdominal aortic diameter 4.1 cm, images independently reviewed by me.  CULTURES: February 28 blood culture  ANTIBIOTICS: February 28 Zosyn February 28 vancomycin  SIGNIFICANT EVENTS: 02/16/2017 pulmonary and critical care consultation  LINES/TUBES:   DISCUSSION: This is a 71 year old male who is admitted for aspiration pneumonia and acute alcoholic pancreatitis who is now undergoing treatment for delirium tremens. Despite treatment with as needed benzodiazepine use his symptoms  have worsened and he requires ICU level care. Pt is currently on precedex drip and his agitation is better last 24 hrs.   ASSESSMENT / PLAN:  PULMONARY A: Aspiration pneumonia/pneumonitis P:   Antibiotics were  discontinued on 3/1.  Observe off abx for now.  (-) fever. (-) increase in O2 requirment. Obviously, if he clinically deteriorates, panculture and resume abx.  Incentive spirometry Duoneb QID (he sounds rhonchorus)   GASTROINTESTINAL A:   Acute alcoholic pancreatitis P:   On clears Pain control per [facility] Continue IV fluids >> suggest to decrease to 75 mls/hr as he is now 5L (+). May start congesting.  NEUROLOGIC A:   Acute encephalopathy secondary to alcohol withdrawal with delirium P:   RASS goal: 0 to -1 Precedex infusion per alcohol withdrawal ICU protocol Continue as needed Ativan Thiamine, folate   FAMILY  - Updates: none bedside. Pt updated of plans at bedside.   - Inter-disciplinary family meet or Palliative Care meeting due by:  day 7  My CCM time 30  minutes  J. Shirl Harris, MD 02/17/2017, 9:52 AM [place] [facility] Pager [phone] After 3 pm or if no answer, call [phone]

## 2017-02-17 NOTE — Progress Notes (Signed)
HR dropped to 30's sustained for over 1 minute.  Pt was alert and talking during this time with RN at bedside.  HR now back in 60-70's.  Paged Dr. Maylene Roes to notify.

## 2017-02-17 NOTE — Progress Notes (Signed)
OT Cancellation Note  Patient Details Name: Joshua Henson MRN: [id number] DOB: 11-12-1946   Cancelled Treatment:    Reason Eval/Treat Not Completed: Medical issues which prohibited therapy.  Pt on precedex. Will check another time.  Trinten Boudoin 02/17/2017, 9:03 AM  Lesle Chris, OTR/L [phone] 02/17/2017

## 2017-02-17 NOTE — Progress Notes (Signed)
PROGRESS NOTE    Joshua Henson  [id number] DOB: 07-26-1946 DOA: 02/14/2017 PCP: Mayra Neer, MD     Brief Narrative:  Joshua Henson is a 71 y.o.malewith medical history significant of alcohol abuse, stroke, NSTEMI, hypertension, depression, anxiety, alcohol cirrhosis, hepatic encephalopathy, who presents after falls and intractable nausea vomiting. Patient brought in via EMS. Initially there was a suspicion of coffee-ground emesis. Patient is alcoholic and admits to drinking a pint of vodka everyday. He started feeling bad 2 days ago after he lost his balance and fell in the bathroom. Subsequently he developed left-sided chest wall pain. As per ED notes patient was again on the floor around 5 PM yesterday when apparently he fell out of the bed. Patient has not taken any of his regular medications for the last 4 days because of excessive alcohol use. Patient also complained of urinary retention, and unable to urinate. Patient denied any abdominal pain or shortness of breath. Workup in the emergency department revealed acute alcoholic pancreatitis.  Assessment & Plan:   Principal Problem:   Acute pancreatitis Active Problems:   Alcohol abuse   Alcohol withdrawal, with delirium (HCC)   Left rib fracture   Chronic alcohol abuse   Delirium tremens (HCC)   Hypokalemia   Essential hypertension   Alcohol withdrawal delirium (HCC)   Alcohol-induced acute pancreatitis  Acute alcoholic pancreatitis -Seen on CT scan as well as elevated lipase -Continue supportive care.   Aspiration pneumonitis  -Initially concerned for aspiration pneumonia and was started on empiric Zosyn. Patient denied any cough, has been afebrile in the hospital as well as normal white blood cell count. Discontinue Zosyn and continue to monitor for now. -Blood cultures negative to date   Alcohol withdrawal with delirium  -Required precedex gtt. Appreciate PCCM  -Folvite, thiamine   Hypokalemia -Replace and  trend   Essential hypertension -Continue Coreg, hydralazine/labetalol prn   History of depression -Continue Wellbutrin, Vistaril  Urinary retention -Foley   Ascending thoracic aortic aneurysm -Measuring 4.1 cm, this will need outpatient follow-up PCP. Outpatient thoracic surgery evaluation recommended  Displaced left anterolateral seventh rib fracture -Pain control    DVT prophylaxis: lovenox Code Status: full Family Communication: no family at bedside Disposition Plan: pending further improvement and stabilization    Consultants:   PCCM  Procedures:   None  Antimicrobials:  Anti-infectives    Start     Dose/Rate Route Frequency Ordered Stop   02/15/17 1600  piperacillin-tazobactam (ZOSYN) IVPB 3.375 g  Status:  Discontinued     3.375 g 12.5 mL/hr over 240 Minutes Intravenous Every 8 hours 02/15/17 1010 02/16/17 1213   02/15/17 0815  piperacillin-tazobactam (ZOSYN) IVPB 3.375 g     3.375 g 100 mL/hr over 30 Minutes Intravenous  Once 02/15/17 0812 02/15/17 0956   02/15/17 0615  levofloxacin (LEVAQUIN) IVPB 750 mg     750 mg 100 mL/hr over 90 Minutes Intravenous  Once 02/15/17 0602 02/15/17 1901         Subjective: Patient required Precedex drip yesterday due to continued agitation and withdrawal symptoms. Per nursing, patient has been able to be aroused with verbal stimuli. He has been able to take oral medications. This morning on my examination, patient is quite somnolent. No new events overnight.   Objective: Vitals:   02/17/17 0700 02/17/17 0800 02/17/17 0900 02/17/17 1000  BP: (!) 165/80 (!) 149/77 (!) 144/85 137/80  Pulse: 76 85 (!) 103 76  Resp: (!) 23 (!) 23 20 (!) 26  Temp:  98.6 F (37 C)    TempSrc:  Oral    SpO2: 91% 90% 92% 93%  Weight:      Height:        Intake/Output Summary (Last 24 hours) at 02/17/17 1059 Last data filed at 02/17/17 1000  Gross per 24 hour  Intake          4638.03 ml  Output             1530 ml  Net           3108.03 ml   Filed Weights   02/15/17 0001 02/15/17 0900  Weight: 79.4 kg (175 lb) 86.6 kg (190 lb 14.7 oz)    Examination:  General exam: somnolent, otherwise comfortable  Respiratory system: +snoring while sleeping. Respiratory effort normal. On room air  Cardiovascular system: S1 & S2 heard, RRR. No JVD, murmurs, rubs, gallops or clicks. No pedal edema. Gastrointestinal system: Abdomen is nondistended, soft and +grimace to palpation. No organomegaly or masses felt. Normal bowel sounds heard. Central nervous system: Somnolent  Extremities: Symmetric  Skin: No rashes, lesions or ulcers  Data Reviewed: I have personally reviewed following labs and imaging studies  CBC:  Recent Labs Lab 02/15/17 0034 02/16/17 0601 02/17/17 0357  WBC 10.4 8.9 6.5  NEUTROABS  --   --  5.1  HGB 16.7 12.8* 11.3*  HCT 47.3 36.0* 32.1*  MCV 93.3 94.2 93.6  PLT 154 87* 75*   Basic Metabolic Panel:  Recent Labs Lab 02/15/17 0034 02/15/17 0435 02/16/17 0344 02/17/17 0357  NA 139  --  141 143  K 4.6  --  3.2* 3.2*  CL 102  --  110 113*  CO2 12*  --  21* 21*  GLUCOSE 128*  --  85 81  BUN 23*  --  21* 14  CREATININE 1.01  --  0.87 0.59*  CALCIUM 8.9  --  8.5* 7.9*  MG  --  2.2  --   --    GFR: Estimated Creatinine Clearance: 90 mL/min (by C-G formula based on SCr of 0.59 mg/dL (L)). Liver Function Tests:  Recent Labs Lab 02/15/17 0034 02/16/17 0344  AST 182* 128*  ALT 90* 60  ALKPHOS 94 69  BILITOT 1.8* 2.0*  PROT 7.7 6.1*  ALBUMIN 4.9 3.5    Recent Labs Lab 02/15/17 0034  LIPASE 1,399*   No results for input(s): AMMONIA in the last 168 hours. Coagulation Profile: No results for input(s): INR, PROTIME in the last 168 hours. Cardiac Enzymes: No results for input(s): CKTOTAL, CKMB, CKMBINDEX, TROPONINI in the last 168 hours. BNP (last 3 results) No results for input(s): PROBNP in the last 8760 hours. HbA1C: No results for input(s): HGBA1C in the last 72  hours. CBG: No results for input(s): GLUCAP in the last 168 hours. Lipid Profile: No results for input(s): CHOL, HDL, LDLCALC, TRIG, CHOLHDL, LDLDIRECT in the last 72 hours. Thyroid Function Tests: No results for input(s): TSH, T4TOTAL, FREET4, T3FREE, THYROIDAB in the last 72 hours. Anemia Panel: No results for input(s): VITAMINB12, FOLATE, FERRITIN, TIBC, IRON, RETICCTPCT in the last 72 hours. Sepsis Labs: No results for input(s): PROCALCITON, LATICACIDVEN in the last 168 hours.  Recent Results (from the past 240 hour(s))  Culture, blood (Routine X 2) w Reflex to ID Panel     Status: None (Preliminary result)   Collection Time: 02/15/17  7:15 AM  Result Value Ref Range Status   Specimen Description BLOOD RIGHT WRIST  Final   Special Requests  IN PEDIATRIC BOTTLE 3CC  Final   Culture   Final    NO GROWTH 1 DAY Performed at [facility], [place] [address], [place], [place] [address]    Report Status PENDING  Incomplete  MRSA PCR Screening     Status: None   Collection Time: 02/15/17  8:43 AM  Result Value Ref Range Status   MRSA by PCR NEGATIVE NEGATIVE Final    Comment:        The GeneXpert MRSA Assay (FDA approved for NASAL specimens only), is one component of a comprehensive MRSA colonization surveillance program. It is not intended to diagnose MRSA infection nor to guide or monitor treatment for MRSA infections.   Culture, blood (Routine X 2) w Reflex to ID Panel     Status: None (Preliminary result)   Collection Time: 02/15/17  9:09 AM  Result Value Ref Range Status   Specimen Description BLOOD LEFT ARM  Final   Special Requests BOTTLES DRAWN AEROBIC AND ANAEROBIC 7 CC  Final   Culture   Final    NO GROWTH 1 DAY Performed at [facility], [address] [place], [place] [address]    Report Status PENDING  Incomplete       Radiology Studies: No results found.    Scheduled Meds: . buPROPion  200 mg Oral Daily  . carvedilol  12.5 mg Oral BID  .  folic acid  1 mg Oral Daily  . ipratropium-albuterol  3 mL Nebulization QID  . mouth rinse  15 mL Mouth Rinse BID  . mirtazapine  7.5 mg Oral QHS  . pantoprazole  40 mg Intravenous QHS  . potassium chloride  40 mEq Oral Q4H  . sodium chloride flush  3 mL Intravenous Q12H  . tamsulosin  0.4 mg Oral Daily  . thiamine  100 mg Oral Daily   Continuous Infusions: . sodium chloride 150 mL/hr at 02/17/17 1000  . dexmedetomidine 0.402 mcg/kg/hr (02/17/17 1000)     LOS: 2 days    Time spent: 30 minutes   Dessa Phi, DO [facility] [email or web address] Password [email or web address] 02/17/2017, 10:59 AM

## 2017-02-17 NOTE — Progress Notes (Signed)
PT Cancellation Note  Patient Details Name: Joshua Henson MRN: [id number] DOB: 01-Jul-1946   Cancelled Treatment:    Reason Eval/Treat Not Completed: Patient not medically ready (currently on Precedex. will check back another time .)   Claretha Cooper 02/17/2017, 8:38 AM Tresa Endo PT [phone]

## 2017-02-18 LAB — CBC WITH DIFFERENTIAL/PLATELET
BASOS ABS: 0 10*3/uL (ref 0.0–0.1)
Basophils Relative: 0 %
EOS ABS: 0.1 10*3/uL (ref 0.0–0.7)
EOS PCT: 1 %
HCT: 33.8 % — ABNORMAL LOW (ref 39.0–52.0)
Hemoglobin: 11.9 g/dL — ABNORMAL LOW (ref 13.0–17.0)
Lymphocytes Relative: 16 %
Lymphs Abs: 1.4 10*3/uL (ref 0.7–4.0)
MCH: 33.2 pg (ref 26.0–34.0)
MCHC: 35.2 g/dL (ref 30.0–36.0)
MCV: 94.4 fL (ref 78.0–100.0)
MONO ABS: 0.8 10*3/uL (ref 0.1–1.0)
Monocytes Relative: 10 %
Neutro Abs: 6.2 10*3/uL (ref 1.7–7.7)
Neutrophils Relative %: 73 %
PLATELETS: 118 10*3/uL — AB (ref 150–400)
RBC: 3.58 MIL/uL — AB (ref 4.22–5.81)
RDW: 14.6 % (ref 11.5–15.5)
WBC: 8.4 10*3/uL (ref 4.0–10.5)

## 2017-02-18 LAB — BASIC METABOLIC PANEL
ANION GAP: 7 (ref 5–15)
BUN: 9 mg/dL (ref 6–20)
CALCIUM: 8.1 mg/dL — AB (ref 8.9–10.3)
CO2: 21 mmol/L — ABNORMAL LOW (ref 22–32)
Chloride: 110 mmol/L (ref 101–111)
Creatinine, Ser: 0.66 mg/dL (ref 0.61–1.24)
GFR calc Af Amer: >60 mL/min (ref >60)
GLUCOSE: 116 mg/dL — AB (ref 65–99)
POTASSIUM: 2.8 mmol/L — AB (ref 3.5–5.1)
SODIUM: 138 mmol/L (ref 135–145)

## 2017-02-18 LAB — MAGNESIUM: MAGNESIUM: 1.4 mg/dL — AB (ref 1.7–2.4)

## 2017-02-18 MED ORDER — ATORVASTATIN CALCIUM 40 MG PO TABS
40.0000 mg | ORAL_TABLET | Freq: Every day | ORAL | Status: DC
  Administered 2017-02-18 – 2017-02-23 (×6): 40 mg via ORAL
  Filled 2017-02-18 (×6): qty 1

## 2017-02-18 MED ORDER — CARVEDILOL 12.5 MG PO TABS
12.5000 mg | ORAL_TABLET | Freq: Two times a day (BID) | ORAL | Status: DC
  Administered 2017-02-18 – 2017-02-23 (×10): 12.5 mg via ORAL
  Filled 2017-02-18 (×11): qty 1

## 2017-02-18 MED ORDER — IPRATROPIUM-ALBUTEROL 0.5-2.5 (3) MG/3ML IN SOLN
3.0000 mL | Freq: Three times a day (TID) | RESPIRATORY_TRACT | Status: DC
  Administered 2017-02-18: 3 mL via RESPIRATORY_TRACT
  Filled 2017-02-18 (×2): qty 3

## 2017-02-18 MED ORDER — SODIUM CHLORIDE 0.9 % IV SOLN
30.0000 meq | INTRAVENOUS | Status: AC
  Administered 2017-02-18 (×2): 30 meq via INTRAVENOUS
  Filled 2017-02-18 (×3): qty 15

## 2017-02-18 MED ORDER — MAGNESIUM SULFATE 2 GM/50ML IV SOLN
2.0000 g | Freq: Once | INTRAVENOUS | Status: AC
  Administered 2017-02-18: 2 g via INTRAVENOUS
  Filled 2017-02-18: qty 50

## 2017-02-18 MED ORDER — ASPIRIN EC 81 MG PO TBEC
81.0000 mg | DELAYED_RELEASE_TABLET | Freq: Every day | ORAL | Status: DC
  Administered 2017-02-18 – 2017-02-23 (×6): 81 mg via ORAL
  Filled 2017-02-18 (×6): qty 1

## 2017-02-18 NOTE — Evaluation (Signed)
Physical Therapy Evaluation Patient Details Name: Joshua Henson MRN: [id number] DOB: Jun 01, 1946 Today's Date: 02/18/2017   History of Present Illness  71 yo male with H/O alcohol abuse, prostate cancer, cirrhosis, hepatic encephalopathy, admitted 02/14/17 after falls at home, continued ETOH abuse, fractured Left 7th rib.  Clinical Impression  The patient is very weak, unable to take steps today.Stood x 2 with much assistance.  HR up to 138 with exertion. Assisted back into bed. RN aware. Family not present. Pt admitted with above diagnosis. Pt currently with functional limitations due to the deficits listed below (see PT Problem List). Pt will benefit from skilled PT to increase their independence and safety with mobility to allow discharge to the venue listed below.       Follow Up Recommendations SNF;Supervision/Assistance - 24 hour    Equipment Recommendations  None recommended by PT    Recommendations for Other Services       Precautions / Restrictions Precautions Precautions: Fall Precaution Comments: monitor HR      Mobility  Bed Mobility Overal bed mobility: Needs Assistance Bed Mobility: Supine to Sit;Sit to Supine     Supine to sit: Mod assist     General bed mobility comments: extra time, assist with trunk, assist to scoot to bed edge. Assist with legs onto the bed.  Transfers Overall transfer level: Needs assistance Equipment used: Rolling walker (2 wheeled) Transfers: Sit to/from Stand Sit to Stand: Mod assist;+2 safety/equipment         General transfer comment: Assist to rise,  steady assist after standing, posterior leaning with RW coming off of the ground. Sat down and rested. Assist to rise again. Attempted side steps along bed but did not really gain any distance.Pts. HR up to 138. Assisted back into bed.  Ambulation/Gait                Stairs            Wheelchair Mobility    Modified Rankin (Stroke Patients Only)       Balance  Overall balance assessment: History of Falls;Needs assistance Sitting-balance support: Feet supported;Bilateral upper extremity supported Sitting balance-Leahy Scale: Fair     Standing balance support: During functional activity;Bilateral upper extremity supported Standing balance-Leahy Scale: Poor Standing balance comment: posterior lean                             Pertinent Vitals/Pain Pain Assessment: Faces Faces Pain Scale: Hurts even more Pain Location: L chest wall, lt shoulder Pain Descriptors / Indicators: Grimacing;Guarding Pain Intervention(s): Repositioned;RN gave pain meds during session;Monitored during session    Home Living Family/patient expects to be discharged to:: Private residence Living Arrangements: Spouse/significant other Available Help at Discharge: Family;Available PRN/intermittently Type of Home: House Home Access: Stairs to enter Entrance Stairs-Rails: Right;Left Entrance Stairs-Number of Steps: 3 Home Layout: Two level;Able to live on main level with bedroom/bathroom Home Equipment: Gilford Rile - 2 wheels;Shower seat - built in;Bedside commode;Grab bars - tub/shower Additional Comments: Information obtained from previous encounter    Prior Function Level of Independence: Independent               Hand Dominance        Extremity/Trunk Assessment   Upper Extremity Assessment Upper Extremity Assessment: RUE deficits/detail;LUE deficits/detail RUE Deficits / Details: noted tremors LUE Deficits / Details: decreased active shoulder elevation, AAROM performed to 90 degrees only, pain limiting  Communication   Communication: HOH  Cognition Arousal/Alertness: Awake/alert Behavior During Therapy: WFL for tasks assessed/performed Overall Cognitive Status: Impaired/Different from baseline Area of Impairment: Orientation;Awareness;Safety/judgement;Memory Orientation Level: Time       Safety/Judgement: Decreased awareness  of deficits          General Comments      Exercises     Assessment/Plan    PT Assessment Patient needs continued PT services  PT Problem List Decreased strength;Decreased safety awareness;Decreased activity tolerance;Decreased balance;Decreased mobility;Cardiopulmonary status limiting activity;Decreased knowledge of precautions;Decreased cognition;Pain       PT Treatment Interventions DME instruction;Gait training;Functional mobility training;Therapeutic activities;Therapeutic exercise    PT Goals (Current goals can be found in the Care Plan section)  Acute Rehab PT Goals Patient Stated Goal: to get up and walk PT Goal Formulation: With patient Time For Goal Achievement: 03/04/17 Potential to Achieve Goals: Fair    Frequency Min 3X/week   Barriers to discharge Decreased caregiver support wife works    Co-evaluation               End of Session   Activity Tolerance: Patient limited by fatigue;Treatment limited secondary to medical complications (Comment) Patient left: in bed;with call bell/phone within reach;with bed alarm set Nurse Communication: Mobility status PT Visit Diagnosis: Unsteadiness on feet (R26.81);Repeated falls (R29.6);Muscle weakness (generalized) (M62.81)         Time: [id number] PT Time Calculation (min) (ACUTE ONLY): 24 min   Charges:   PT Evaluation $PT Eval Moderate Complexity: 1 Procedure PT Treatments $Therapeutic Activity: 8-22 mins   PT G Codes:         Claretha Cooper 02/18/2017, 12:48 PM Tresa Endo PT [phone]

## 2017-02-18 NOTE — Progress Notes (Signed)
OT Cancellation Note  Patient Details Name: Joshua Henson MRN: [id number] DOB: 1946-07-11   Cancelled Treatment:    Reason Eval/Treat Not Completed: Fatigue/lethargy limiting ability to participate   [name], OTR/L [id number]   [name] 02/18/2017, 4:25 PM

## 2017-02-18 NOTE — Progress Notes (Signed)
PROGRESS NOTE    Joshua Henson  [id number] DOB: Jun 24, 1946 DOA: 02/14/2017 PCP: Mayra Neer, MD     Brief Narrative:  Joshua Henson is a 71 y.o.malewith medical history significant of alcohol abuse, stroke, NSTEMI, hypertension, depression, anxiety, alcohol cirrhosis, hepatic encephalopathy, who presents after falls and intractable nausea vomiting. Patient brought in via EMS. Initially there was a suspicion of coffee-ground emesis. Patient is alcoholic and admits to drinking a pint of vodka everyday. He started feeling bad 2 days ago after he lost his balance and fell in the bathroom. Subsequently he developed left-sided chest wall pain. As per ED notes patient was again on the floor around 5 PM yesterday when apparently he fell out of the bed. Patient has not taken any of his regular medications for the last 4 days because of excessive alcohol use. Patient also complained of urinary retention, and unable to urinate. Patient denied any abdominal pain or shortness of breath. Workup in the emergency department revealed acute alcoholic pancreatitis.  Assessment & Plan:   Principal Problem:   Acute pancreatitis Active Problems:   Alcohol abuse   Alcohol withdrawal, with delirium (HCC)   Left rib fracture   Chronic alcohol abuse   Delirium tremens (HCC)   Hypokalemia   Essential hypertension   Alcohol withdrawal delirium (HCC)   Alcohol-induced acute pancreatitis  Acute alcoholic pancreatitis -Seen on CT scan as well as elevated lipase -Continue supportive care, able to tolerate CLD, will slowly advance to FLD today   Aspiration pneumonitis  -Initially concerned for aspiration pneumonia and was started on empiric Zosyn. Patient denied any cough, has been afebrile in the hospital as well as normal white blood cell count. Discontinue Zosyn and continue to monitor for now. -Blood cultures negative to date   Alcohol withdrawal with delirium  -Required precedex gtt. Appreciate PCCM.  Currently off precedex  -Folvite, thiamine   Hypokalemia -Replace and trend   Hypomagnesemia -Replace and trend   Essential hypertension -Continue Coreg, hydralazine prn   History of depression -Continue Wellbutrin, Vistaril  Urinary retention -Foley   Ascending thoracic aortic aneurysm -Measuring 4.1 cm, this will need outpatient follow-up PCP. Outpatient thoracic surgery evaluation recommended  Displaced left anterolateral seventh rib fracture -Pain control    DVT prophylaxis: lovenox Code Status: full Family Communication: no family at bedside Disposition Plan: pending further improvement and stabilization    Consultants:   PCCM  Procedures:   None  Antimicrobials:  Anti-infectives    Start     Dose/Rate Route Frequency Ordered Stop   02/15/17 1600  piperacillin-tazobactam (ZOSYN) IVPB 3.375 g  Status:  Discontinued     3.375 g 12.5 mL/hr over 240 Minutes Intravenous Every 8 hours 02/15/17 1010 02/16/17 1213   02/15/17 0815  piperacillin-tazobactam (ZOSYN) IVPB 3.375 g     3.375 g 100 mL/hr over 30 Minutes Intravenous  Once 02/15/17 0812 02/15/17 0956   02/15/17 0615  levofloxacin (LEVAQUIN) IVPB 750 mg     750 mg 100 mL/hr over 90 Minutes Intravenous  Once 02/15/17 0602 02/15/17 1901         Subjective: Patient currently off Precedex and is much more alert. He is able to remember me and our previous conversation from 2 days ago. He states that he is very hungry and wants to eat. He does have some abdominal pain that worsens with cough. No other complaints today.   Objective: Vitals:   02/18/17 0446 02/18/17 0500 02/18/17 0600 02/18/17 0800  BP:  (!) 154/84 136/81 Marland Kitchen)  177/96  Pulse:  (!) 110 (!) 109 (!) 108  Resp:  20 (!) 23 (!) 22  Temp: 99.9 F (37.7 C)     TempSrc: Oral     SpO2:  93% 94% 95%  Weight:      Height:        Intake/Output Summary (Last 24 hours) at 02/18/17 1151 Last data filed at 02/18/17 0604  Gross per 24 hour    Intake          2339.06 ml  Output              930 ml  Net          1409.06 ml   Filed Weights   02/15/17 0001 02/15/17 0900  Weight: 79.4 kg (175 lb) 86.6 kg (190 lb 14.7 oz)    Examination:  General exam: alert, awake, comfortable  Respiratory system: CTAB, Respiratory effort normal Cardiovascular system: S1 & S2 heard, Tachycardic, regular rhythm. No JVD, murmurs, rubs, gallops or clicks. No pedal edema. Gastrointestinal system: Abdomen is nondistended, soft, tender to palpation along the left rib cage. No organomegaly or masses felt. Normal bowel sounds heard. Central nervous system: Alert, oriented, appropriate, nonfocal Extremities: Symmetric  Skin: No rashes, lesions or ulcers  Data Reviewed: I have personally reviewed following labs and imaging studies  CBC:  Recent Labs Lab 02/15/17 0034 02/16/17 0601 02/17/17 0357 02/18/17 0355  WBC 10.4 8.9 6.5 8.4  NEUTROABS  --   --  5.1 6.2  HGB 16.7 12.8* 11.3* 11.9*  HCT 47.3 36.0* 32.1* 33.8*  MCV 93.3 94.2 93.6 94.4  PLT 154 87* 75* [id number]*   Basic Metabolic Panel:  Recent Labs Lab 02/15/17 0034 02/15/17 0435 02/16/17 0344 02/17/17 0357 02/18/17 0355  NA 139  --  141 143 138  K 4.6  --  3.2* 3.2* 2.8*  CL 102  --  110 113* 110  CO2 12*  --  21* 21* 21*  GLUCOSE 128*  --  85 81 116*  BUN 23*  --  21* 14 9  CREATININE 1.01  --  0.87 0.59* 0.66  CALCIUM 8.9  --  8.5* 7.9* 8.1*  MG  --  2.2  --   --  1.4*   GFR: Estimated Creatinine Clearance: 90 mL/min (by C-G formula based on SCr of 0.66 mg/dL). Liver Function Tests:  Recent Labs Lab 02/15/17 0034 02/16/17 0344  AST 182* 128*  ALT 90* 60  ALKPHOS 94 69  BILITOT 1.8* 2.0*  PROT 7.7 6.1*  ALBUMIN 4.9 3.5    Recent Labs Lab 02/15/17 0034  LIPASE 1,399*   No results for input(s): AMMONIA in the last 168 hours. Coagulation Profile: No results for input(s): INR, PROTIME in the last 168 hours. Cardiac Enzymes: No results for input(s): CKTOTAL,  CKMB, CKMBINDEX, TROPONINI in the last 168 hours. BNP (last 3 results) No results for input(s): PROBNP in the last 8760 hours. HbA1C: No results for input(s): HGBA1C in the last 72 hours. CBG: No results for input(s): GLUCAP in the last 168 hours. Lipid Profile: No results for input(s): CHOL, HDL, LDLCALC, TRIG, CHOLHDL, LDLDIRECT in the last 72 hours. Thyroid Function Tests: No results for input(s): TSH, T4TOTAL, FREET4, T3FREE, THYROIDAB in the last 72 hours. Anemia Panel: No results for input(s): VITAMINB12, FOLATE, FERRITIN, TIBC, IRON, RETICCTPCT in the last 72 hours. Sepsis Labs: No results for input(s): PROCALCITON, LATICACIDVEN in the last 168 hours.  Recent Results (from the past 240 hour(s))  Culture, blood (  Routine X 2) w Reflex to ID Panel     Status: None (Preliminary result)   Collection Time: 02/15/17  7:15 AM  Result Value Ref Range Status   Specimen Description BLOOD RIGHT WRIST  Final   Special Requests IN PEDIATRIC BOTTLE 3CC  Final   Culture   Final    NO GROWTH 2 DAYS Performed at [facility], [place] [address], [place], [place] [address]    Report Status PENDING  Incomplete  MRSA PCR Screening     Status: None   Collection Time: 02/15/17  8:43 AM  Result Value Ref Range Status   MRSA by PCR NEGATIVE NEGATIVE Final    Comment:        The GeneXpert MRSA Assay (FDA approved for NASAL specimens only), is one component of a comprehensive MRSA colonization surveillance program. It is not intended to diagnose MRSA infection nor to guide or monitor treatment for MRSA infections.   Culture, blood (Routine X 2) w Reflex to ID Panel     Status: None (Preliminary result)   Collection Time: 02/15/17  9:09 AM  Result Value Ref Range Status   Specimen Description BLOOD LEFT ARM  Final   Special Requests BOTTLES DRAWN AEROBIC AND ANAEROBIC 7 CC  Final   Culture   Final    NO GROWTH 2 DAYS Performed at [facility], [address], [place], [place]  [address]    Report Status PENDING  Incomplete       Radiology Studies: No results found.    Scheduled Meds: . aspirin EC  81 mg Oral Daily  . atorvastatin  40 mg Oral q1800  . buPROPion  200 mg Oral Daily  . carvedilol  12.5 mg Oral BID  . folic acid  1 mg Oral Daily  . ipratropium-albuterol  3 mL Nebulization QID  . mouth rinse  15 mL Mouth Rinse BID  . mirtazapine  7.5 mg Oral QHS  . pantoprazole  40 mg Intravenous QHS  . potassium chloride (KCL MULTIRUN) 30 mEq in 265 mL IVPB  30 mEq Intravenous Q3H  . sodium chloride flush  3 mL Intravenous Q12H  . tamsulosin  0.4 mg Oral Daily  . thiamine  100 mg Oral Daily   Continuous Infusions: . sodium chloride 75 mL/hr at 02/18/17 0604     LOS: 3 days    Time spent: 30 minutes   Dessa Phi, DO [facility] [email or web address] Password [email or web address] 02/18/2017, 11:51 AM

## 2017-02-18 NOTE — Progress Notes (Signed)
   Off precdex Talking andd drinking water Looks ok   PCCM will sign off Call back if needed  D/w RN  Dr. Brand Males, M.D., [facility].C.P Pulmonary and Critical Care Medicine Staff Physician [facility] [facility] Pager: [phone], If no answer or between  15:00h - 7:00h: call [phone]  02/18/2017 9:39 AM

## 2017-02-19 LAB — CBC WITH DIFFERENTIAL/PLATELET
Basophils Absolute: 0 10*3/uL (ref 0.0–0.1)
Basophils Relative: 0 %
EOS ABS: 0.1 10*3/uL (ref 0.0–0.7)
Eosinophils Relative: 1 %
HEMATOCRIT: 33.8 % — AB (ref 39.0–52.0)
HEMOGLOBIN: 12 g/dL — AB (ref 13.0–17.0)
LYMPHS ABS: 1.4 10*3/uL (ref 0.7–4.0)
Lymphocytes Relative: 17 %
MCH: 32.9 pg (ref 26.0–34.0)
MCHC: 35.5 g/dL (ref 30.0–36.0)
MCV: 92.6 fL (ref 78.0–100.0)
MONO ABS: 1.8 10*3/uL — AB (ref 0.1–1.0)
MONOS PCT: 22 %
NEUTROS ABS: 5.1 10*3/uL (ref 1.7–7.7)
Neutrophils Relative %: 60 %
Platelets: 138 10*3/uL — ABNORMAL LOW (ref 150–400)
RBC: 3.65 MIL/uL — ABNORMAL LOW (ref 4.22–5.81)
RDW: 14.2 % (ref 11.5–15.5)
WBC: 8.4 10*3/uL (ref 4.0–10.5)

## 2017-02-19 LAB — MAGNESIUM: Magnesium: 1.5 mg/dL — ABNORMAL LOW (ref 1.7–2.4)

## 2017-02-19 LAB — BASIC METABOLIC PANEL
Anion gap: 11 (ref 5–15)
BUN: 5 mg/dL — AB (ref 6–20)
CALCIUM: 8.2 mg/dL — AB (ref 8.9–10.3)
CHLORIDE: 102 mmol/L (ref 101–111)
CO2: 21 mmol/L — AB (ref 22–32)
Creatinine, Ser: 0.58 mg/dL — ABNORMAL LOW (ref 0.61–1.24)
GFR calc Af Amer: >60 mL/min (ref >60)
GFR calc non Af Amer: >60 mL/min (ref >60)
GLUCOSE: 112 mg/dL — AB (ref 65–99)
Potassium: 2.9 mmol/L — ABNORMAL LOW (ref 3.5–5.1)
Sodium: 134 mmol/L — ABNORMAL LOW (ref 135–145)

## 2017-02-19 MED ORDER — IPRATROPIUM-ALBUTEROL 0.5-2.5 (3) MG/3ML IN SOLN: 3.0000 mL | RESPIRATORY_TRACT | Status: DC | PRN

## 2017-02-19 MED ORDER — SODIUM CHLORIDE 0.9 % IV SOLN
30.0000 meq | Freq: Once | INTRAVENOUS | Status: AC
  Administered 2017-02-19: 30 meq via INTRAVENOUS
  Filled 2017-02-19: qty 15

## 2017-02-19 MED ORDER — POTASSIUM CHLORIDE IN NACL 40-0.9 MEQ/L-% IV SOLN
INTRAVENOUS | Status: AC
  Administered 2017-02-19: 75 mL/h via INTRAVENOUS
  Filled 2017-02-19 (×2): qty 1000

## 2017-02-19 MED ORDER — MAGNESIUM SULFATE 4 GM/100ML IV SOLN
4.0000 g | Freq: Once | INTRAVENOUS | Status: AC
  Administered 2017-02-19: 4 g via INTRAVENOUS
  Filled 2017-02-19: qty 100

## 2017-02-19 MED ORDER — SODIUM CHLORIDE 0.9 % IV SOLN
INTRAVENOUS | Status: DC
  Administered 2017-02-19 – 2017-02-21 (×3): via INTRAVENOUS

## 2017-02-19 NOTE — Evaluation (Signed)
Occupational Therapy Evaluation Patient Details Name: Joshua Henson MRN: [id number] DOB: [birth year] Today's Date: 02/19/2017    History of Present Illness 71 yo male with H/O alcohol abuse, prostate cancer, cirrhosis, hepatic encephalopathy, admitted 02/14/17 after falls at home, continued ETOH abuse, fractured Left 7th rib.   Clinical Impression   Pt noted to have decreased cognition as he was not currently oriented to time, place or situation. He became agitated with further requests for ROM assessment of UEs. Assisted pt to reposition in bed with +2 total assistance. Will attempt to follow pt for continued OT as long as pt is agreeable and participate in therapy. Will work toward increased independence in self care tasks and progress L UE strengthening and ROM as tolerated for better use in ADL.    Follow Up Recommendations  SNF;Supervision/Assistance - 24 hour    Equipment Recommendations  Other (comment) (defer to next venue)    Recommendations for Other Services       Precautions / Restrictions Precautions Precautions: Fall Precaution Comments: monitor HR Restrictions Weight Bearing Restrictions: No      Mobility Bed Mobility Overal bed mobility: Needs Assistance             General bed mobility comments: +2 assist to pull pt back up in the bed to midline. Pt did not attempt to assist when OT requested that he try to move LEs toward the middle of the bed.  Transfers                      Balance                                            ADL Overall ADL's : Needs assistance/impaired             Lower Body Bathing: Total assistance;Bed level   Upper Body Dressing : Total assistance;Bed level                     General ADL Comments: Limited eval as pt did not cooperate much with formal assessment. Pt becoming agitated with further requests to move UEs and perform UE exercises. Pt states he thought he is in a hotel and  states "I have things I need to take care of." Attempted to reorient pt to current time, place, situation. He also states he thought it was Valentines Day but yet was able to state the current month as March when asked. When OT arrived, pt lying crossways in bed with LEs moved over toward left side of bed and L knee propped on bed rail. With +2 assist, positioned pt back in midline. Pt's gown most of the way off when OT arrived and pt initially not cooperative with therapist helping him to don it again but finally did allow the assist. Pt did not attempt to help much with donning gown even on his R UE. Limited ability to move L UE to help with donning gown on that side.     Vision   Additional Comments: not able to assess as pt agitated.     Perception     Praxis      Pertinent Vitals/Pain Pain Assessment: Faces Faces Pain Scale: Hurts even more Pain Location: left shoulder Pain Descriptors / Indicators: Grimacing;Guarding Pain Intervention(s): Monitored during session     Hand Dominance  Extremity/Trunk Assessment Upper Extremity Assessment RUE Deficits / Details: pt didnt allow for formal assessment though pt used R UE functionally to pull down gown. LUE Deficits / Details: very limited shoulder flexion due to pain. AAROM to approximately 45 degrees then pt resisted further motion. Able to grip therapist's hand. Would not cooperate for further MMT for R UE or further with L UE           Communication Communication Communication: HOH   Cognition Arousal/Alertness: Awake/alert Behavior During Therapy: Agitated Overall Cognitive Status: Impaired/Different from baseline Area of Impairment: Orientation;Awareness;Safety/judgement;Memory Orientation Level: Situation;Time;Place       Safety/Judgement: Decreased awareness of safety;Decreased awareness of deficits     General Comments: Pt thought he was in a hotel and stated, "I have things I need to take care of." When OT  attempted to move UEs, pt gripped with R hand very tightly to therapist's hand. Pt becoming somewhat agitated during session so session concluded. Nursing aware. Pt thought it was Valentine's Day yet knew the month was March.    General Comments       Exercises       Shoulder Instructions      Home Living Family/patient expects to be discharged to:: Private residence Living Arrangements: Spouse/significant other Available Help at Discharge: Family;Available PRN/intermittently Type of Home: House Home Access: Stairs to enter [employer] of Steps: 3 Entrance Stairs-Rails: Right;Left Home Layout: Two level;Able to live on main level with bedroom/bathroom     Bathroom Shower/Tub: Occupational psychologist: Standard     Home Equipment: Environmental consultant - 2 wheels;Shower seat - built in;Bedside commode;Grab bars - tub/shower   Additional Comments: Information obtained from previous encounter      Prior Functioning/Environment Level of Independence: Independent                 OT Problem List: Decreased strength;Decreased activity tolerance;Decreased knowledge of use of DME or AE;Decreased cognition;Decreased range of motion      OT Treatment/Interventions: Self-care/ADL training;Therapeutic exercise;Patient/family education;DME and/or AE instruction;Therapeutic activities    OT Goals(Current goals can be found in the care plan section) Acute Rehab OT Goals Patient Stated Goal: none stated. Time For Goal Achievement: 03/05/17 Potential to Achieve Goals: Fair  OT Frequency: Min 2X/week   Barriers to D/C:            Co-evaluation              End of Session    Activity Tolerance: Treatment limited secondary to agitation Patient left: in bed;with call bell/phone within reach;with bed alarm set  OT Visit Diagnosis: Pain Pain - Right/Left: Left Pain - part of body: Shoulder                ADL either performed or assessed with clinical judgement   Time: 1126-1139 OT Time Calculation (min): 13 min Charges:  OT General Charges $OT Visit: 1 Procedure OT Evaluation $OT Eval Moderate Complexity: 1 Procedure G-Codes:       Jules Schick 02/19/2017, 12:03 PM

## 2017-02-19 NOTE — Progress Notes (Signed)
PROGRESS NOTE    Joshua Henson  [id number] DOB: 1946-10-02 DOA: 02/14/2017 PCP: Mayra Neer, MD     Brief Narrative:  Joshua Henson is a 71 y.o.malewith medical history significant of alcohol abuse, stroke, NSTEMI, hypertension, depression, anxiety, alcohol cirrhosis, hepatic encephalopathy, who presents after falls and intractable nausea vomiting. Patient brought in via EMS. Initially there was a suspicion of coffee-ground emesis. Patient is alcoholic and admits to drinking a pint of vodka everyday. He started feeling bad 2 days ago after he lost his balance and fell in the bathroom. Subsequently he developed left-sided chest wall pain. As per ED notes patient was again on the floor around 5 PM yesterday when apparently he fell out of the bed. Patient has not taken any of his regular medications for the last 4 days because of excessive alcohol use. Patient also complained of urinary retention, and unable to urinate. Patient denied any abdominal pain or shortness of breath. Workup in the emergency department revealed acute alcoholic pancreatitis.  Assessment & Plan:   Principal Problem:   Acute pancreatitis Active Problems:   Alcohol abuse   Alcohol withdrawal, with delirium (HCC)   Left rib fracture   Chronic alcohol abuse   Delirium tremens (HCC)   Hypokalemia   Essential hypertension   Alcohol withdrawal delirium (HCC)   Alcohol-induced acute pancreatitis  Acute alcoholic pancreatitis -Seen on CT scan as well as elevated lipase -Continue supportive care, able to tolerate FLD   Aspiration pneumonitis  -Initially concerned for aspiration pneumonia and was started on empiric Zosyn. Patient denied any cough, has been afebrile in the hospital as well as normal white blood cell count. Discontinue Zosyn and continue to monitor for now. -Blood cultures negative to date   Alcohol withdrawal with delirium  -Required precedex gtt. Appreciate PCCM. Currently off precedex, continue  ativan prn for CIWA scale  -Folvite, thiamine   Hypokalemia -Replace and trend   Hypomagnesemia -Replace and trend   Essential hypertension -Continue Coreg, hydralazine prn   History of depression -Continue Wellbutrin, Vistaril  Urinary retention -Foley   Ascending thoracic aortic aneurysm -Measuring 4.1 cm, this will need outpatient follow-up PCP. Outpatient thoracic surgery evaluation recommended  Displaced left anterolateral seventh rib fracture -Pain control    DVT prophylaxis: lovenox Code Status: full Family Communication: spoke with wife over phone this morning with update Disposition Plan: pending further improvement and stabilization    Consultants:   PCCM  Procedures:   None  Antimicrobials:  Anti-infectives    Start     Dose/Rate Route Frequency Ordered Stop   02/15/17 1600  piperacillin-tazobactam (ZOSYN) IVPB 3.375 g  Status:  Discontinued     3.375 g 12.5 mL/hr over 240 Minutes Intravenous Every 8 hours 02/15/17 1010 02/16/17 1213   02/15/17 0815  piperacillin-tazobactam (ZOSYN) IVPB 3.375 g     3.375 g 100 mL/hr over 30 Minutes Intravenous  Once 02/15/17 0812 02/15/17 0956   02/15/17 0615  levofloxacin (LEVAQUIN) IVPB 750 mg     750 mg 100 mL/hr over 90 Minutes Intravenous  Once 02/15/17 0602 02/15/17 1901         Subjective: Patient more confused than yesterday. This morning, patient states that he had a rough night due to a business venture gone wrong overnight. He also points to the cabinet in the room and states that he would like to drink vodka from the bottle that's in the cupboard. Denies new complaints, no abdominal pain. Spoke with wife over the phone who states that  patient's withdrawal symptoms are typically worse at day 5 and 6.   Objective: Vitals:   02/19/17 0500 02/19/17 0800 02/19/17 0821 02/19/17 1200  BP: (!) 152/99 (!) 186/85 (!) 174/99 125/71  Pulse: 98 98 96 90  Resp: (!) 29 18 (!) 33 (!) 23  Temp:  99.4 F  (37.4 C)    TempSrc:  Oral    SpO2: 92% 93% 94% 92%  Weight:      Height:        Intake/Output Summary (Last 24 hours) at 02/19/17 1219 Last data filed at 02/19/17 0600  Gross per 24 hour  Intake             1720 ml  Output             1475 ml  Net              245 ml   Filed Weights   02/15/17 0001 02/15/17 0900  Weight: 79.4 kg (175 lb) 86.6 kg (190 lb 14.7 oz)    Examination:  General exam: alert, awake, comfortable  Respiratory system: CTAB, Respiratory effort normal Cardiovascular system: S1 & S2 heard, RRR. No JVD, murmurs, rubs, gallops or clicks. No pedal edema. Gastrointestinal system: Abdomen is nondistended, soft, tender to palpation along the left rib cage. No organomegaly or masses felt. Normal bowel sounds heard. No other abdominal pain to palpation  Central nervous system: Alert, but disoriented and confused today  Extremities: Symmetric  Skin: No rashes, lesions or ulcers  Data Reviewed: I have personally reviewed following labs and imaging studies  CBC:  Recent Labs Lab 02/15/17 0034 02/16/17 0601 02/17/17 0357 02/18/17 0355 02/19/17 0336  WBC 10.4 8.9 6.5 8.4 8.4  NEUTROABS  --   --  5.1 6.2 5.1  HGB 16.7 12.8* 11.3* 11.9* 12.0*  HCT 47.3 36.0* 32.1* 33.8* 33.8*  MCV 93.3 94.2 93.6 94.4 92.6  PLT 154 87* 75* 118* 0000000*   Basic Metabolic Panel:  Recent Labs Lab 02/15/17 0034 02/15/17 0435 02/16/17 0344 02/17/17 0357 02/18/17 0355 02/19/17 0336  NA 139  --  141 143 138 134*  K 4.6  --  3.2* 3.2* 2.8* 2.9*  CL 102  --  110 113* 110 102  CO2 12*  --  21* 21* 21* 21*  GLUCOSE 128*  --  85 81 116* 112*  BUN 23*  --  21* 14 9 5*  CREATININE 1.01  --  0.87 0.59* 0.66 0.58*  CALCIUM 8.9  --  8.5* 7.9* 8.1* 8.2*  MG  --  2.2  --   --  1.4* 1.5*   GFR: Estimated Creatinine Clearance: 90 mL/min (by C-G formula based on SCr of 0.58 mg/dL (L)). Liver Function Tests:  Recent Labs Lab 02/15/17 0034 02/16/17 0344  AST 182* 128*  ALT 90* 60    ALKPHOS 94 69  BILITOT 1.8* 2.0*  PROT 7.7 6.1*  ALBUMIN 4.9 3.5    Recent Labs Lab 02/15/17 0034  LIPASE 1,399*   No results for input(s): AMMONIA in the last 168 hours. Coagulation Profile: No results for input(s): INR, PROTIME in the last 168 hours. Cardiac Enzymes: No results for input(s): CKTOTAL, CKMB, CKMBINDEX, TROPONINI in the last 168 hours. BNP (last 3 results) No results for input(s): PROBNP in the last 8760 hours. HbA1C: No results for input(s): HGBA1C in the last 72 hours. CBG: No results for input(s): GLUCAP in the last 168 hours. Lipid Profile: No results for input(s): CHOL, HDL, LDLCALC, TRIG,  CHOLHDL, LDLDIRECT in the last 72 hours. Thyroid Function Tests: No results for input(s): TSH, T4TOTAL, FREET4, T3FREE, THYROIDAB in the last 72 hours. Anemia Panel: No results for input(s): VITAMINB12, FOLATE, FERRITIN, TIBC, IRON, RETICCTPCT in the last 72 hours. Sepsis Labs: No results for input(s): PROCALCITON, LATICACIDVEN in the last 168 hours.  Recent Results (from the past 240 hour(s))  Culture, blood (Routine X 2) w Reflex to ID Panel     Status: None (Preliminary result)   Collection Time: 02/15/17  7:15 AM  Result Value Ref Range Status   Specimen Description BLOOD RIGHT WRIST  Final   Special Requests IN PEDIATRIC BOTTLE 3CC  Final   Culture   Final    NO GROWTH 3 DAYS Performed at [facility], [address] [place], [place] [address]    Report Status PENDING  Incomplete  MRSA PCR Screening     Status: None   Collection Time: 02/15/17  8:43 AM  Result Value Ref Range Status   MRSA by PCR NEGATIVE NEGATIVE Final    Comment:        The GeneXpert MRSA Assay (FDA approved for NASAL specimens only), is one component of a comprehensive MRSA colonization surveillance program. It is not intended to diagnose MRSA infection nor to guide or monitor treatment for MRSA infections.   Culture, blood (Routine X 2) w Reflex to ID Panel     Status:  None (Preliminary result)   Collection Time: 02/15/17  9:09 AM  Result Value Ref Range Status   Specimen Description BLOOD LEFT ARM  Final   Special Requests BOTTLES DRAWN AEROBIC AND ANAEROBIC 7 CC  Final   Culture   Final    NO GROWTH 3 DAYS Performed at [facility], [address] [place], [place] [address]    Report Status PENDING  Incomplete       Radiology Studies: No results found.    Scheduled Meds: . aspirin EC  81 mg Oral Daily  . atorvastatin  40 mg Oral q1800  . buPROPion  200 mg Oral Daily  . carvedilol  12.5 mg Oral BID  . folic acid  1 mg Oral Daily  . ipratropium-albuterol  3 mL Nebulization TID  . mouth rinse  15 mL Mouth Rinse BID  . mirtazapine  7.5 mg Oral QHS  . pantoprazole  40 mg Intravenous QHS  . sodium chloride flush  3 mL Intravenous Q12H  . tamsulosin  0.4 mg Oral Daily  . thiamine  100 mg Oral Daily   Continuous Infusions: . 0.9 % NaCl with KCl 40 mEq / L 75 mL/hr (02/19/17 0651)     LOS: 4 days    Time spent: 30 minutes   Dessa Phi, DO [facility] [email or web address] Password [email or web address] 02/19/2017, 12:19 PM

## 2017-02-20 LAB — BASIC METABOLIC PANEL
ANION GAP: 10 (ref 5–15)
BUN: 8 mg/dL (ref 6–20)
CHLORIDE: 104 mmol/L (ref 101–111)
CO2: 23 mmol/L (ref 22–32)
Calcium: 8.5 mg/dL — ABNORMAL LOW (ref 8.9–10.3)
Creatinine, Ser: 0.68 mg/dL (ref 0.61–1.24)
GFR calc non Af Amer: >60 mL/min (ref >60)
Glucose, Bld: 107 mg/dL — ABNORMAL HIGH (ref 65–99)
Potassium: 3.1 mmol/L — ABNORMAL LOW (ref 3.5–5.1)
Sodium: 137 mmol/L (ref 135–145)

## 2017-02-20 LAB — CULTURE, BLOOD (ROUTINE X 2)
CULTURE: NO GROWTH
CULTURE: NO GROWTH

## 2017-02-20 LAB — CBC WITH DIFFERENTIAL/PLATELET
Basophils Absolute: 0 10*3/uL (ref 0.0–0.1)
Basophils Relative: 0 %
Eosinophils Absolute: 0.1 10*3/uL (ref 0.0–0.7)
Eosinophils Relative: 1 %
HCT: 33.7 % — ABNORMAL LOW (ref 39.0–52.0)
HEMOGLOBIN: 12 g/dL — AB (ref 13.0–17.0)
LYMPHS ABS: 1.3 10*3/uL (ref 0.7–4.0)
LYMPHS PCT: 19 %
MCH: 33 pg (ref 26.0–34.0)
MCHC: 35.6 g/dL (ref 30.0–36.0)
MCV: 92.6 fL (ref 78.0–100.0)
Monocytes Absolute: 2.1 10*3/uL — ABNORMAL HIGH (ref 0.1–1.0)
Monocytes Relative: 29 %
NEUTROS PCT: 51 %
Neutro Abs: 3.6 10*3/uL (ref 1.7–7.7)
Platelets: 151 10*3/uL (ref 150–400)
RBC: 3.64 MIL/uL — AB (ref 4.22–5.81)
RDW: 14.2 % (ref 11.5–15.5)
WBC: 7.1 10*3/uL (ref 4.0–10.5)

## 2017-02-20 LAB — MAGNESIUM: Magnesium: 1.8 mg/dL (ref 1.7–2.4)

## 2017-02-20 MED ORDER — PANTOPRAZOLE SODIUM 40 MG PO TBEC
40.0000 mg | DELAYED_RELEASE_TABLET | Freq: Every day | ORAL | Status: DC
  Administered 2017-02-20 – 2017-02-23 (×4): 40 mg via ORAL
  Filled 2017-02-20 (×4): qty 1

## 2017-02-20 MED ORDER — LORAZEPAM 2 MG/ML IJ SOLN
1.0000 mg | Freq: Four times a day (QID) | INTRAMUSCULAR | Status: DC | PRN
  Administered 2017-02-21 – 2017-02-23 (×7): 1 mg via INTRAVENOUS
  Filled 2017-02-20 (×7): qty 1

## 2017-02-20 MED ORDER — LORAZEPAM 1 MG PO TABS
1.0000 mg | ORAL_TABLET | Freq: Four times a day (QID) | ORAL | Status: DC | PRN
  Administered 2017-02-23 (×2): 1 mg via ORAL
  Filled 2017-02-20 (×2): qty 1

## 2017-02-20 MED ORDER — LORAZEPAM 2 MG/ML IJ SOLN: 1.0000 mg | Freq: Four times a day (QID) | INTRAMUSCULAR | Status: DC | PRN

## 2017-02-20 MED ORDER — LORAZEPAM 1 MG PO TABS: 1.0000 mg | ORAL_TABLET | Freq: Four times a day (QID) | ORAL | Status: DC | PRN

## 2017-02-20 MED ORDER — POTASSIUM CHLORIDE CRYS ER 20 MEQ PO TBCR
40.0000 meq | EXTENDED_RELEASE_TABLET | ORAL | Status: AC
  Administered 2017-02-20 (×2): 40 meq via ORAL
  Filled 2017-02-20 (×2): qty 2

## 2017-02-20 NOTE — Progress Notes (Signed)
Physical Therapy Treatment Patient Details Name: Joshua Henson MRN: [id number] DOB: 08-Dec-1946 Today's Date: 02/20/2017    History of Present Illness 71 yo male with H/O alcohol abuse, prostate cancer, cirrhosis, hepatic encephalopathy, admitted 02/14/17 after falls at home, continued ETOH abuse, fractured Left 7th rib.    PT Comments    The patient was very lethargic, much encouragement to mobilize and stay sitting at the bedside. Continue PT.    Follow Up Recommendations  SNF;Supervision/Assistance - 24 hour     Equipment Recommendations  None recommended by PT    Recommendations for Other Services       Precautions / Restrictions Precautions Precautions: Fall Precaution Comments: monitor HR    Mobility  Bed Mobility Overal bed mobility: Needs Assistance Bed Mobility: Supine to Sit;Sit to Supine     Supine to sit: Mod assist;Max assist Sit to supine: Max assist   General bed mobility comments: assist with trunk, pt. placed legs over the bed and back onto bed sesveral times.  Transfers Overall transfer level: Needs assistance Equipment used: Rolling walker (2 wheeled) Transfers: Sit to/from Stand Sit to Stand: Mod assist;+2 safety/equipment;Max assist         General transfer comment: Assist to rise,  steady assist after standing, posterior leaning Sat down and rested. and porceeded to return self to supine  Ambulation/Gait                 Stairs            Wheelchair Mobility    Modified Rankin (Stroke Patients Only)       Balance Overall balance assessment: History of Falls;Needs assistance Sitting-balance support: Feet supported;Bilateral upper extremity supported Sitting balance-Leahy Scale: Fair Sitting balance - Comments: very lethrgic but aroused. Combed his hair in sitting                            Cognition Arousal/Alertness: Lethargic;Suspect due to medications Behavior During Therapy: [facility] for tasks  assessed/performed Overall Cognitive Status: No family/caregiver present to determine baseline cognitive functioning Area of Impairment: Orientation;Awareness;Safety/judgement;Memory               General Comments: pt. was cooperativebut limited in his participation    Exercises      General Comments        Pertinent Vitals/Pain Pain Assessment: No/denies pain Faces Pain Scale: Hurts even more Pain Location: left side Pain Descriptors / Indicators: Grimacing;Guarding Pain Intervention(s): Monitored during session    Home Living                      Prior Function            PT Goals (current goals can now be found in the care plan section)      Frequency    Min 3X/week      PT Plan Current plan remains appropriate    Co-evaluation PT/OT/SLP Co-Evaluation/Treatment: Yes Reason for Co-Treatment: For patient/therapist safety PT goals addressed during session: Mobility/safety with mobility       End of Session   Activity Tolerance: Patient limited by fatigue Patient left: in bed;with call bell/phone within reach;with bed alarm set Nurse Communication: Mobility status PT Visit Diagnosis: Unsteadiness on feet (R26.81);Repeated falls (R29.6);Muscle weakness (generalized) (M62.81)     Time: [id number] PT Time Calculation (min) (ACUTE ONLY): 15 min  Charges:  G Codes:       Claretha Cooper 02/20/2017, 5:05 PM Tresa Endo PT [phone]

## 2017-02-20 NOTE — NC FL2 (Signed)
[place] LEVEL OF CARE SCREENING TOOL     IDENTIFICATION  Patient Name: Joshua Henson Birthdate: 1946-04-23 Sex: male Admission Date (Current Location): 02/14/2017  [facility] and [place] Number:  Herbalist and Address:  [facility],  [place], [place]      Provider Number: [id number]  Attending Physician Name and Address:  Shon Millet*  Relative Name and Phone Number:       Current Level of Care: Hospital Recommended Level of Care: [place] Prior Approval Number:    Date Approved/Denied:   PASRR Number: [id number]  Discharge Plan: SNF    Current Diagnoses: Patient Active Problem List   Diagnosis Date Noted  . Alcohol-induced acute pancreatitis   . Hematemesis 02/15/2017  . Acute pancreatitis 02/15/2017  . Alcohol dependence with unspecified alcohol-induced disorder ([place])   . Alcohol withdrawal delirium ([place]) 09/13/2016  . C. difficile colitis 03/30/2016  . Essential hypertension 03/23/2016  . Fall   . Tobacco abuse   . Hypokalemia   . CAD (coronary artery disease)   . Cocaine abuse   . Frequent falls 01/11/2016  . Left rib fracture 01/11/2016  . Acute encephalopathy 01/11/2016  . Polysubstance abuse 01/11/2016  . Delirium tremens ([place]) 01/11/2016  . NSTEMI (non-ST elevated myocardial infarction) ([place]) 01/11/2016  . Sepsis due to Enterococcus with acute renal failure and metabolic encephalopathy ([place]) 01/11/2016  . Chronic alcohol abuse   . CAD (coronary artery disease), native coronary artery 01/10/2016  . Fatty liver, alcoholic [id number]  . Ataxia   . Alcohol withdrawal, with delirium ([place]) 09/03/2014  . Unspecified cerebral artery occlusion with cerebral infarction 07/05/2014  . Alcohol abuse 07/02/2014  . Current tobacco use 01/17/2014  . Hearing loss 11/25/2013  . Tinnitus of both ears 11/25/2013    Orientation RESPIRATION BLADDER Height & Weight     Self, Place  O2 Incontinent Weight: 190 lb 14.7 oz (86.6 kg) Height:  5\' 10"  (177.8 cm)  BEHAVIORAL SYMPTOMS/MOOD NEUROLOGICAL BOWEL NUTRITION STATUS  Other (Comment) (Pt can be restless / agitated at times)   Incontinent Diet  AMBULATORY STATUS COMMUNICATION OF NEEDS Skin   Extensive Assist Verbally Normal                       Personal Care Assistance Level of Assistance  Bathing, Feeding, Dressing Bathing Assistance: Maximum assistance Feeding assistance: Limited assistance Dressing Assistance: Maximum assistance     Functional Limitations Info  Sight, Hearing, Speech Sight Info: Adequate Hearing Info: Impaired Speech Info: Adequate    SPECIAL CARE FACTORS FREQUENCY  PT (By licensed PT), OT (By licensed OT)     PT Frequency: 5x wk OT Frequency: 5x wk            Contractures Contractures Info: Not present    Additional Factors Info  Code Status, Allergies, Psychotropic Code Status Info: Full Code             Current Medications (02/20/2017):  This is the current hospital active medication list Current Facility-Administered Medications  Medication Dose Route Frequency Provider Last Rate Last Dose  . 0.9 %  sodium chloride infusion   Intravenous Continuous Shon Millet, DO 50 mL/hr at 02/20/17 1216    . aspirin EC tablet 81 mg  81 mg Oral Daily [employer], DO   81 mg at 02/20/17 1242  . atorvastatin (LIPITOR) tablet 40 mg  40 mg Oral q1800 Shon Millet, DO  40 mg at 02/19/17 1743  . buPROPion (WELLBUTRIN SR) 12 hr tablet 200 mg  200 mg Oral Daily Reyne Dumas, MD   200 mg at 02/20/17 1241  . carvedilol (COREG) tablet 12.5 mg  12.5 mg Oral BID Rich Fuchs Choi, DO   12.5 mg at 02/20/17 1242  . folic acid (FOLVITE) tablet 1 mg  1 mg Oral Daily Reyne Dumas, MD   1 mg at 02/20/17 1242  . hydrALAZINE (APRESOLINE) injection 10 mg  10 mg Intravenous Q4H PRN Gardiner Barefoot, NP   10 mg at 02/20/17 0212  . ipratropium-albuterol  (DUONEB) 0.5-2.5 (3) MG/3ML nebulizer solution 3 mL  3 mL Nebulization Q4H PRN Shon Millet, DO      . LORazepam (ATIVAN) injection 1-2 mg  1-2 mg Intravenous Q1H PRN Juanito Doom, MD   2 mg at 02/20/17 1252  . MEDLINE mouth rinse  15 mL Mouth Rinse BID Reyne Dumas, MD   15 mL at 02/20/17 1243  . mirtazapine (REMERON) tablet 7.5 mg  7.5 mg Oral QHS Reyne Dumas, MD   7.5 mg at 02/19/17 2246  . morphine 4 MG/ML injection 2 mg  2 mg Intravenous Q2H PRN Shon Millet, DO   2 mg at 02/20/17 0103  . ondansetron (ZOFRAN) tablet 4 mg  4 mg Oral Q6H PRN Reyne Dumas, MD       Or  . ondansetron (ZOFRAN) injection 4 mg  4 mg Intravenous Q6H PRN Reyne Dumas, MD      . oxyCODONE (Oxy IR/ROXICODONE) immediate release tablet 5 mg  5 mg Oral Q4H PRN Reyne Dumas, MD   5 mg at 02/20/17 0742  . pantoprazole (PROTONIX) EC tablet 40 mg  40 mg Oral Daily Thomes Lolling, RPH      . senna-docusate (Senokot-S) tablet 1 tablet  1 tablet Oral QHS PRN Reyne Dumas, MD      . sodium chloride flush (NS) 0.9 % injection 3 mL  3 mL Intravenous Q12H Reyne Dumas, MD   3 mL at 02/17/17 1000  . tamsulosin (FLOMAX) capsule 0.4 mg  0.4 mg Oral Daily Reyne Dumas, MD   0.4 mg at 02/20/17 1242  . thiamine (VITAMIN B-1) tablet 100 mg  100 mg Oral Daily Reyne Dumas, MD   100 mg at 02/20/17 1242     Discharge Medications: Please see discharge summary for a list of discharge medications.  Relevant Imaging Results:  Relevant Lab Results:   Additional Information SSN: [id number]  [place] Hagans, Randall An, LCSW

## 2017-02-20 NOTE — Clinical Social Work Note (Signed)
Clinical Social Work Assessment  Patient Details  Name: Joshua Henson MRN: [id number] Date of Birth: 06/26/1946  Date of referral:  02/20/17               Reason for consult:  Facility Placement, Discharge Planning                Permission sought to share information with:  Chartered certified accountant granted to share information::  Yes, Verbal Permission Granted  Name::        Agency::     Relationship::     Contact Information:     Housing/Transportation Living arrangements for the past 2 months:  Single Family Home Source of Information:  Spouse Patient Interpreter Needed:  None Criminal Activity/Legal Involvement Pertinent to Current Situation/Hospitalization:  No - Comment as needed Significant Relationships:  Spouse Lives with:  Spouse Do you feel safe going back to the place where you live?   (SNF recommended.) Need for family participation in patient care:  Yes (Comment)  Care giving concerns:  Pt's care cannot be managed at home following hospital d/c.   Social Worker assessment / plan:  Pt hospitalized from home on 02/14/17 with Acute alcoholic pancreatitis, Bilateral Aspiration pneumonia. CSW met with spouse/significant other to assist with d/c planning. Pt was receiving care and unable to participate in dc planning. PT has recommended SNF at d/c. Spouse is in agreement with this plan. ETOH abuse was not addressed at this time. SNF search has been initiated and bed offers pending. CSW will continue to follow to assist with d/c planning needs.  Employment status:  Aeronautical engineer:  Medicare PT Recommendations:  [place] / Referral to community resources:  [place]  Patient/Family's Response to care:  Spouse agrees with PT recommendations for SNF at d/c.  Patient/Family's Understanding of and Emotional Response to Diagnosis, Current Treatment, and Prognosis:  Spouse is aware of pt's medical  status. CSW has not yet discussed SNF placement with pt. Pt has had some confusion during hospital stay. CSW will meet with pt / spouse again to review dc plan and provide ongoing support.  Emotional Assessment Appearance:  Appears stated age Attitude/Demeanor/Rapport:  Unable to Assess Affect (typically observed):  Unable to Assess Orientation:  Oriented to Self, Oriented to Place Alcohol / Substance use:  Alcohol Use Psych involvement (Current and /or in the community):  No (Comment)  Discharge Needs  Concerns to be addressed:  Discharge Planning Concerns Readmission within the last 30 days:  No Current discharge risk:  None Barriers to Discharge:  No Barriers Identified   Luretha Rued, [place] 02/20/2017, 3:24 PM

## 2017-02-20 NOTE — Progress Notes (Signed)
Occupational Therapy Treatment Patient Details Name: Joshua Henson MRN: [id number] DOB: 06-02-1946 Today's Date: 02/20/2017    History of present illness 71 yo male with H/O alcohol abuse, prostate cancer, cirrhosis, hepatic encephalopathy, admitted 02/14/17 after falls at home, continued ETOH abuse, fractured Left 7th rib.   OT comments  Pt Very fatigued. Pt did sit EOB with A and much encouragement. Pt would attempt to just lay back in bed without warning. Pt was aware he was very sleepy- stating -'i just cant wake up'  Follow Up Recommendations  SNF;Supervision/Assistance - 24 hour              Mobility Bed Mobility Overal bed mobility: Needs Assistance Bed Mobility: Supine to Sit;Sit to Supine     Supine to sit: Mod assist;Max assist Sit to supine: Max assist      Transfers Overall transfer level: Needs assistance Equipment used: Rolling walker (2 wheeled) Transfers: Sit to/from Stand Sit to Stand: Mod assist;+2 safety/equipment;Max assist                  ADL       Grooming: Sitting;Moderate assistance;Brushing hair                                 General ADL Comments: co tx with PT.  Pt did agree to sit EOB but needed much encouragement. Pt very sleepy!                Cognition   Behavior During Therapy: WFL for tasks assessed/performed Overall Cognitive Status: No family/caregiver present to determine baseline cognitive functioning                         Exercises             Pertinent Vitals/ Pain       Pain Assessment: No/denies pain            Progress Toward Goals  OT Goals(current goals can now be found in the care plan section)  Progress towards OT goals: Progressing toward goals     Plan Discharge plan remains appropriate       End of Session Equipment Utilized During Treatment: Rolling walker  OT Visit Diagnosis: Unsteadiness on feet (R26.81)   Activity Tolerance Patient limited by fatigue    Patient Left in bed;with call bell/phone within reach;with bed alarm set   Nurse Communication Mobility status        Time: [id number] OT Time Calculation (min): 13 min  Charges: OT General Charges $OT Visit: 1 Procedure OT Treatments $Self Care/Home Management : 8-22 mins  [place], [place] [phone]   Payton Mccallum D 02/20/2017, 4:12 PM

## 2017-02-20 NOTE — Clinical Social Work Placement (Signed)
   CLINICAL SOCIAL WORK PLACEMENT  NOTE  Date:  02/20/2017  Patient Details  Name: Joshua Henson MRN: [id number] Date of Birth: Apr 03, 1946  Clinical Social Work is seeking post-discharge placement for this patient at the [place] level of care (*CSW will initial, date and re-position this form in  chart as items are completed):      Patient/family provided with [facility] Work Department's list of facilities offering this level of care within the geographic area requested by the patient (or if unable, by the patient's family).  Yes   Patient/family informed of their freedom to choose among providers that offer the needed level of care, that participate in Medicare, Medicaid or managed care program needed by the patient, have an available bed and are willing to accept the patient.  Yes   Patient/family informed of [facility]'s ownership interest in [facility] and [facility], as well as of the fact that they are under no obligation to receive care at these facilities.  PASRR submitted to EDS on       PASRR number received on       Existing PASRR number confirmed on 02/20/17     FL2 transmitted to all facilities in geographic area requested by pt/family on 02/20/17     FL2 transmitted to all facilities within larger geographic area on       Patient informed that his/her managed care company has contracts with or will negotiate with certain facilities, including the following:            Patient/family informed of bed offers received.  Patient chooses bed at       Physician recommends and patient chooses bed at      Patient to be transferred to   on  .  Patient to be transferred to facility by       Patient family notified on   of transfer.  Name of family member notified:        PHYSICIAN       Additional Comment:    _______________________________________________ Luretha Rued, [place] 02/20/2017, 3:51 PM

## 2017-02-20 NOTE — Progress Notes (Signed)
PROGRESS NOTE    Joshua Henson  [id number] DOB: 1946-05-11 DOA: 02/14/2017 PCP: Mayra Neer, MD     Brief Narrative:  Joshua Henson is a 71 y.o.malewith medical history significant of alcohol abuse, stroke, NSTEMI, hypertension, depression, anxiety, alcohol cirrhosis, hepatic encephalopathy, who presents after falls and intractable nausea vomiting. Patient brought in via EMS. Initially there was a suspicion of coffee-ground emesis. Patient is alcoholic and admits to drinking a pint of vodka everyday. He started feeling bad 2 days ago after he lost his balance and fell in the bathroom. Subsequently he developed left-sided chest wall pain. As per ED notes patient was again on the floor around 5 PM yesterday when apparently he fell out of the bed. Patient has not taken any of his regular medications for the last 4 days because of excessive alcohol use. Patient also complained of urinary retention, and unable to urinate. Patient denied any abdominal pain or shortness of breath. Workup in the emergency department revealed acute alcoholic pancreatitis.  Assessment & Plan:   Principal Problem:   Acute pancreatitis Active Problems:   Alcohol abuse   Alcohol withdrawal, with delirium (HCC)   Left rib fracture   Chronic alcohol abuse   Delirium tremens (HCC)   Hypokalemia   Essential hypertension   Alcohol withdrawal delirium (HCC)   Alcohol-induced acute pancreatitis  Acute alcoholic pancreatitis -Seen on CT scan as well as elevated lipase -Continue supportive care, able to tolerate FLD   Aspiration pneumonitis  -Initially concerned for aspiration pneumonia and was started on empiric Zosyn. Patient denied any cough, has been afebrile in the hospital as well as normal white blood cell count. Discontinue Zosyn and continue to monitor for now. -Blood cultures negative to date   Alcohol withdrawal with delirium  -Required precedex gtt. Appreciate PCCM. Currently off precedex, continue  ativan prn for CIWA scale  -Folvite, thiamine   Hypokalemia -Replace and trend   Essential hypertension -Continue Coreg, hydralazine prn   History of depression -Continue Wellbutrin, Vistaril  Urinary retention -Foley   Ascending thoracic aortic aneurysm -Measuring 4.1 cm, this will need outpatient follow-up PCP. Outpatient thoracic surgery evaluation recommended  Displaced left anterolateral seventh rib fracture -Pain control    DVT prophylaxis: lovenox Code Status: full Family Communication: spoke with wife over phone 3/4 Disposition Plan: pending further improvement and stabilization    Consultants:   PCCM  Procedures:   None  Antimicrobials:  Anti-infectives    Start     Dose/Rate Route Frequency Ordered Stop   02/15/17 1600  piperacillin-tazobactam (ZOSYN) IVPB 3.375 g  Status:  Discontinued     3.375 g 12.5 mL/hr over 240 Minutes Intravenous Every 8 hours 02/15/17 1010 02/16/17 1213   02/15/17 0815  piperacillin-tazobactam (ZOSYN) IVPB 3.375 g     3.375 g 100 mL/hr over 30 Minutes Intravenous  Once 02/15/17 0812 02/15/17 0956   02/15/17 0615  levofloxacin (LEVAQUIN) IVPB 750 mg     750 mg 100 mL/hr over 90 Minutes Intravenous  Once 02/15/17 0602 02/15/17 1901         Subjective: Continues to be confused intermittently, asking for his cell phone but points toward the window. No new complaints but states that he feels terrible    Objective: Vitals:   02/20/17 0000 02/20/17 0100 02/20/17 0304 02/20/17 0700  BP: (!) 166/105 (!) 176/99    Pulse: 89 95    Resp: (!) 27 (!) 31    Temp:   99.4 F (37.4 C) 100.1 F (37.8  C)  TempSrc:   Axillary Axillary  SpO2: 91% 90%    Weight:      Height:        Intake/Output Summary (Last 24 hours) at 02/20/17 1205 Last data filed at 02/20/17 0100  Gross per 24 hour  Intake            762.5 ml  Output                0 ml  Net            762.5 ml   Filed Weights   02/15/17 0001 02/15/17 0900    Weight: 79.4 kg (175 lb) 86.6 kg (190 lb 14.7 oz)    Examination:  General exam: alert, awake, comfortable  Respiratory system: CTAB, Respiratory effort normal Cardiovascular system: S1 & S2 heard, RRR. No JVD, murmurs, rubs, gallops or clicks. No pedal edema. Gastrointestinal system: Abdomen is nondistended, soft, tender to palpation along the left rib cage. No organomegaly or masses felt. Normal bowel sounds heard. +mild epigastric abdominal pain to palpation  Central nervous system: Alert, but disoriented and confused today  Extremities: Symmetric  Skin: No rashes, lesions or ulcers  Data Reviewed: I have personally reviewed following labs and imaging studies  CBC:  Recent Labs Lab 02/16/17 0601 02/17/17 0357 02/18/17 0355 02/19/17 0336 02/20/17 0315  WBC 8.9 6.5 8.4 8.4 7.1  NEUTROABS  --  5.1 6.2 5.1 3.6  HGB 12.8* 11.3* 11.9* 12.0* 12.0*  HCT 36.0* 32.1* 33.8* 33.8* 33.7*  MCV 94.2 93.6 94.4 92.6 92.6  PLT 87* 75* 118* 138* [id number]   Basic Metabolic Panel:  Recent Labs Lab 02/15/17 0435 02/16/17 0344 02/17/17 0357 02/18/17 0355 02/19/17 0336 02/20/17 0315  NA  --  141 143 138 134* 137  K  --  3.2* 3.2* 2.8* 2.9* 3.1*  CL  --  110 113* 110 102 104  CO2  --  21* 21* 21* 21* 23  GLUCOSE  --  85 81 116* 112* 107*  BUN  --  21* 14 9 5* 8  CREATININE  --  0.87 0.59* 0.66 0.58* 0.68  CALCIUM  --  8.5* 7.9* 8.1* 8.2* 8.5*  MG 2.2  --   --  1.4* 1.5* 1.8   GFR: Estimated Creatinine Clearance: 90 mL/min (by C-G formula based on SCr of 0.68 mg/dL). Liver Function Tests:  Recent Labs Lab 02/15/17 0034 02/16/17 0344  AST 182* 128*  ALT 90* 60  ALKPHOS 94 69  BILITOT 1.8* 2.0*  PROT 7.7 6.1*  ALBUMIN 4.9 3.5    Recent Labs Lab 02/15/17 0034  LIPASE 1,399*   No results for input(s): AMMONIA in the last 168 hours. Coagulation Profile: No results for input(s): INR, PROTIME in the last 168 hours. Cardiac Enzymes: No results for input(s): CKTOTAL, CKMB,  CKMBINDEX, TROPONINI in the last 168 hours. BNP (last 3 results) No results for input(s): PROBNP in the last 8760 hours. HbA1C: No results for input(s): HGBA1C in the last 72 hours. CBG: No results for input(s): GLUCAP in the last 168 hours. Lipid Profile: No results for input(s): CHOL, HDL, LDLCALC, TRIG, CHOLHDL, LDLDIRECT in the last 72 hours. Thyroid Function Tests: No results for input(s): TSH, T4TOTAL, FREET4, T3FREE, THYROIDAB in the last 72 hours. Anemia Panel: No results for input(s): VITAMINB12, FOLATE, FERRITIN, TIBC, IRON, RETICCTPCT in the last 72 hours. Sepsis Labs: No results for input(s): PROCALCITON, LATICACIDVEN in the last 168 hours.  Recent Results (from the past 240 hour(s))  Culture, blood (Routine X 2) w Reflex to ID Panel     Status: None (Preliminary result)   Collection Time: 02/15/17  7:15 AM  Result Value Ref Range Status   Specimen Description BLOOD RIGHT WRIST  Final   Special Requests IN PEDIATRIC BOTTLE 3CC  Final   Culture   Final    NO GROWTH 4 DAYS Performed at [facility], [place] [address], [place], [place] [address]    Report Status PENDING  Incomplete  MRSA PCR Screening     Status: None   Collection Time: 02/15/17  8:43 AM  Result Value Ref Range Status   MRSA by PCR NEGATIVE NEGATIVE Final    Comment:        The GeneXpert MRSA Assay (FDA approved for NASAL specimens only), is one component of a comprehensive MRSA colonization surveillance program. It is not intended to diagnose MRSA infection nor to guide or monitor treatment for MRSA infections.   Culture, blood (Routine X 2) w Reflex to ID Panel     Status: None (Preliminary result)   Collection Time: 02/15/17  9:09 AM  Result Value Ref Range Status   Specimen Description BLOOD LEFT ARM  Final   Special Requests BOTTLES DRAWN AEROBIC AND ANAEROBIC 7 CC  Final   Culture   Final    NO GROWTH 4 DAYS Performed at [facility], [place] [address] [place], [place]  [address]    Report Status PENDING  Incomplete       Radiology Studies: No results found.    Scheduled Meds: . aspirin EC  81 mg Oral Daily  . atorvastatin  40 mg Oral q1800  . buPROPion  200 mg Oral Daily  . carvedilol  12.5 mg Oral BID  . folic acid  1 mg Oral Daily  . mouth rinse  15 mL Mouth Rinse BID  . mirtazapine  7.5 mg Oral QHS  . pantoprazole  40 mg Intravenous QHS  . potassium chloride  40 mEq Oral Q4H  . sodium chloride flush  3 mL Intravenous Q12H  . tamsulosin  0.4 mg Oral Daily  . thiamine  100 mg Oral Daily   Continuous Infusions: . sodium chloride 75 mL/hr at 02/20/17 0856     LOS: 5 days    Time spent: 30 minutes   Dessa Phi, DO [facility] [email or web address] Password [email or web address] 02/20/2017, 12:05 PM

## 2017-02-20 NOTE — Progress Notes (Signed)
Pt arrived to unit, sleepy but easy to arouse. Condom cath in place , tape securing tube, bruises

## 2017-02-20 NOTE — Progress Notes (Signed)
PHARMACIST - PHYSICIAN COMMUNICATION  DR:  Maylene Roes  CONCERNING: IV to Oral Route Change Policy  RECOMMENDATION: This patient is receiving Protonix by the intravenous route.  Based on criteria approved by the Pharmacy and Therapeutics Committee, the intravenous medication(s) is/are being converted to the equivalent oral dose form(s).   DESCRIPTION: These criteria include:  The patient is eating (either orally or via tube) and/or has been taking other orally administered medications for a least 24 hours  The patient has no evidence of active gastrointestinal bleeding or impaired GI absorption (gastrectomy, short bowel, patient on TNA or NPO).  If you have questions about this conversion, please contact the Pharmacy Department  []   [phone] )  [facility] []   [phone] )  [facility] []   [phone] )  [facility] []   [phone] )  [facility] [x]   [phone] )  [place], [place], [facility] 02/20/2017 2:39 PM

## 2017-02-21 ENCOUNTER — Inpatient Hospital Stay (HOSPITAL_COMMUNITY): Payer: Medicare Other

## 2017-02-21 LAB — BASIC METABOLIC PANEL
Anion gap: 9 (ref 5–15)
BUN: 9 mg/dL (ref 6–20)
CHLORIDE: 105 mmol/L (ref 101–111)
CO2: 23 mmol/L (ref 22–32)
CREATININE: 0.7 mg/dL (ref 0.61–1.24)
Calcium: 8.5 mg/dL — ABNORMAL LOW (ref 8.9–10.3)
GFR calc Af Amer: >60 mL/min (ref >60)
GFR calc non Af Amer: >60 mL/min (ref >60)
Glucose, Bld: 107 mg/dL — ABNORMAL HIGH (ref 65–99)
Potassium: 3.4 mmol/L — ABNORMAL LOW (ref 3.5–5.1)
SODIUM: 137 mmol/L (ref 135–145)

## 2017-02-21 LAB — MAGNESIUM: MAGNESIUM: 1.6 mg/dL — AB (ref 1.7–2.4)

## 2017-02-21 MED ORDER — IOPAMIDOL (ISOVUE-300) INJECTION 61%: 30.0000 mL | Freq: Once | INTRAVENOUS | Status: DC

## 2017-02-21 MED ORDER — PIPERACILLIN-TAZOBACTAM 3.375 G IVPB
3.3750 g | Freq: Three times a day (TID) | INTRAVENOUS | Status: DC
  Filled 2017-02-21 (×2): qty 50

## 2017-02-21 MED ORDER — IOPAMIDOL (ISOVUE-300) INJECTION 61%
INTRAVENOUS | Status: AC
Start: 2017-02-21 — End: 2017-02-21
  Filled 2017-02-21: qty 30

## 2017-02-21 MED ORDER — PIPERACILLIN SOD-TAZOBACTAM SO 3.375 (3-0.375) G IV SOLR
3.3750 g | Freq: Three times a day (TID) | INTRAVENOUS | Status: DC
  Administered 2017-02-21 – 2017-02-23 (×6): 3.375 g via INTRAVENOUS
  Filled 2017-02-21 (×8): qty 3.38

## 2017-02-21 MED ORDER — RESOURCE THICKENUP CLEAR PO POWD
ORAL | Status: DC | PRN
  Filled 2017-02-21 (×2): qty 125

## 2017-02-21 MED ORDER — PIPERACILLIN-TAZOBACTAM 3.375 G IVPB: 3.3750 g | Freq: Three times a day (TID) | INTRAVENOUS | Status: DC

## 2017-02-21 MED ORDER — MAGNESIUM SULFATE 2 GM/50ML IV SOLN
2.0000 g | Freq: Once | INTRAVENOUS | Status: AC
  Administered 2017-02-21: 2 g via INTRAVENOUS
  Filled 2017-02-21: qty 50

## 2017-02-21 MED ORDER — POTASSIUM CHLORIDE CRYS ER 20 MEQ PO TBCR
40.0000 meq | EXTENDED_RELEASE_TABLET | Freq: Once | ORAL | Status: AC
  Administered 2017-02-21: 40 meq via ORAL
  Filled 2017-02-21: qty 2

## 2017-02-21 MED ORDER — SODIUM CHLORIDE 0.9 % IV BOLUS (SEPSIS)
250.0000 mL | Freq: Once | INTRAVENOUS | Status: AC
  Administered 2017-02-21: 250 mL via INTRAVENOUS

## 2017-02-21 MED ORDER — IOPAMIDOL (ISOVUE-300) INJECTION 61%
INTRAVENOUS | Status: AC
  Administered 2017-02-21: 100 mL via INTRAVENOUS
  Filled 2017-02-21: qty 100

## 2017-02-21 NOTE — Progress Notes (Signed)
Patient with temp of 100.7. NP on call notified. New order placed. 250 cc NS bolus given. Temp recheck 102.6. Ice packs placed under axillary. NP on call notified.

## 2017-02-21 NOTE — Progress Notes (Addendum)
PROGRESS NOTE    Joshua Henson  [id number] DOB: January 09, 1946 DOA: 02/14/2017 PCP: Mayra Neer, MD     Brief Narrative:  Joshua Henson is a 71 y.o.malewith medical history significant of alcohol abuse, stroke, NSTEMI, hypertension, depression, anxiety, alcohol cirrhosis, hepatic encephalopathy, who presents after falls and intractable nausea vomiting. Patient brought in via EMS. Initially there was a suspicion of coffee-ground emesis. Patient is alcoholic and admits to drinking a pint of vodka everyday. He started feeling bad 2 days ago after he lost his balance and fell in the bathroom. Subsequently he developed left-sided chest wall pain. As per ED notes patient was again on the floor around 5 PM yesterday when apparently he fell out of the bed. Patient has not taken any of his regular medications for the last 4 days because of excessive alcohol use. Patient also complained of urinary retention, and unable to urinate. Patient denied any abdominal pain or shortness of breath. Workup in the emergency department revealed acute alcoholic pancreatitis.   He has been treated for acute alcoholic pancreatitis with supportive care. His main issue has been alcohol withdrawal, requiring Precedex drip for short period of time. Overnight on 3/5, he developed fevers requiring CT scan to evaluate possible pneumonia vs pancreatic etiology.    Assessment & Plan:   Principal Problem:   Acute pancreatitis Active Problems:   Alcohol abuse   Alcohol withdrawal, with delirium (HCC)   Left rib fracture   Chronic alcohol abuse   Delirium tremens (HCC)   Hypokalemia   Essential hypertension   Alcohol withdrawal delirium (HCC)   Alcohol-induced acute pancreatitis  Acute alcoholic pancreatitis -Seen on CT scan as well as elevated lipase -Continue supportive care, able to tolerate FLD, minimal abdominal tenderness to palpation -Repeat CT abdomen due to new fever, peripancreatic fluid with acute  pancreatitis, improved from 2/28   Sepsis secondary to aspiration pneumonia  -Initially concerned for aspiration pneumonia at time of admission and was started on empiric Zosyn. Patient denied any cough, has been afebrile in the hospital as well as normal white blood cell count. Zosyn was discontinued -New fever overnight, CT chest obtained with new ground-glass consolidation, could be secondary to aspiration -Restart zosyn -SLP eval   Alcohol withdrawal with delirium  -Required precedex gtt. Appreciate PCCM. Currently off precedex, continue ativan prn for CIWA scale  -Folvite, thiamine  -Mentation has been slowly improving   Hypokalemia -Replace and trend   Hypomagnesemia -Replace and trend  Essential hypertension -Continue Coreg, hydralazine prn   History of depression -Continue Wellbutrin, Vistaril  Urinary retention -Foley   Ascending thoracic aortic aneurysm -Measuring 4.1 cm, this will need outpatient follow-up PCP. Outpatient thoracic surgery evaluation recommended  Displaced left anterolateral seventh rib fracture -Pain control    DVT prophylaxis: lovenox Code Status: full Family Communication: wife at bedside  Disposition Plan: pending further improvement and stabilization    Consultants:   PCCM  Procedures:   None  Antimicrobials:  Anti-infectives    Start     Dose/Rate Route Frequency Ordered Stop   02/15/17 1600  piperacillin-tazobactam (ZOSYN) IVPB 3.375 g  Status:  Discontinued     3.375 g 12.5 mL/hr over 240 Minutes Intravenous Every 8 hours 02/15/17 1010 02/16/17 1213   02/15/17 0815  piperacillin-tazobactam (ZOSYN) IVPB 3.375 g     3.375 g 100 mL/hr over 30 Minutes Intravenous  Once 02/15/17 0812 02/15/17 0956   02/15/17 0615  levofloxacin (LEVAQUIN) IVPB 750 mg     750 mg 100 mL/hr  over 90 Minutes Intravenous  Once 02/15/17 0602 02/15/17 1901         Subjective: Patient states he is feeling terrible. He is unable to further  characterize his complaints. He denies any nausea, vomiting or abdominal pain this morning. Denies any shortness of breath.   Objective: Vitals:   02/20/17 2028 02/20/17 2354 02/21/17 0520 02/21/17 0645  BP: 130/86 (!) 178/94 (!) 154/83   Pulse:  95 91   Resp:   (!) 21   Temp:  99.2 F (37.3 C) (!) 100.7 F (38.2 C) (!) 102.6 F (39.2 C)  TempSrc:  Oral Oral Oral  SpO2:  97% 98%   Weight:      Height:        Intake/Output Summary (Last 24 hours) at 02/21/17 1130 Last data filed at 02/21/17 0900  Gross per 24 hour  Intake          2664.99 ml  Output              600 ml  Net          2064.99 ml   Filed Weights   02/15/17 0001 02/15/17 0900  Weight: 79.4 kg (175 lb) 86.6 kg (190 lb 14.7 oz)    Examination:  General exam: alert, awake Respiratory system: rhonchi, Respiratory effort normal, on [place] O2  Cardiovascular system: S1 & S2 heard, RRR. No JVD, murmurs, rubs, gallops or clicks. No pedal edema. Gastrointestinal system: Abdomen is nondistended, soft, tender to palpation along the left rib cage. No organomegaly or masses felt. Normal bowel sounds heard. Epigastric region not tender to palpation  Central nervous system: Alert, less confused today  Extremities: Symmetric  Skin: No rashes, lesions or ulcers  Data Reviewed: I have personally reviewed following labs and imaging studies  CBC:  Recent Labs Lab 02/16/17 0601 02/17/17 0357 02/18/17 0355 02/19/17 0336 02/20/17 0315  WBC 8.9 6.5 8.4 8.4 7.1  NEUTROABS  --  5.1 6.2 5.1 3.6  HGB 12.8* 11.3* 11.9* 12.0* 12.0*  HCT 36.0* 32.1* 33.8* 33.8* 33.7*  MCV 94.2 93.6 94.4 92.6 92.6  PLT 87* 75* 118* 138* [id number]   Basic Metabolic Panel:  Recent Labs Lab 02/15/17 0435  02/17/17 0357 02/18/17 0355 02/19/17 0336 02/20/17 0315 02/21/17 0634  NA  --   < > 143 138 134* 137 137  K  --   < > 3.2* 2.8* 2.9* 3.1* 3.4*  CL  --   < > 113* 110 102 104 105  CO2  --   < > 21* 21* 21* 23 23  GLUCOSE  --   < > 81 116* 112*  107* 107*  BUN  --   < > 14 9 5* 8 9  CREATININE  --   < > 0.59* 0.66 0.58* 0.68 0.70  CALCIUM  --   < > 7.9* 8.1* 8.2* 8.5* 8.5*  MG 2.2  --   --  1.4* 1.5* 1.8 1.6*  < > = values in this interval not displayed. GFR: Estimated Creatinine Clearance: 90 mL/min (by C-G formula based on SCr of 0.7 mg/dL). Liver Function Tests:  Recent Labs Lab 02/15/17 0034 02/16/17 0344  AST 182* 128*  ALT 90* 60  ALKPHOS 94 69  BILITOT 1.8* 2.0*  PROT 7.7 6.1*  ALBUMIN 4.9 3.5    Recent Labs Lab 02/15/17 0034  LIPASE 1,399*   No results for input(s): AMMONIA in the last 168 hours. Coagulation Profile: No results for input(s): INR, PROTIME in the  last 168 hours. Cardiac Enzymes: No results for input(s): CKTOTAL, CKMB, CKMBINDEX, TROPONINI in the last 168 hours. BNP (last 3 results) No results for input(s): PROBNP in the last 8760 hours. HbA1C: No results for input(s): HGBA1C in the last 72 hours. CBG: No results for input(s): GLUCAP in the last 168 hours. Lipid Profile: No results for input(s): CHOL, HDL, LDLCALC, TRIG, CHOLHDL, LDLDIRECT in the last 72 hours. Thyroid Function Tests: No results for input(s): TSH, T4TOTAL, FREET4, T3FREE, THYROIDAB in the last 72 hours. Anemia Panel: No results for input(s): VITAMINB12, FOLATE, FERRITIN, TIBC, IRON, RETICCTPCT in the last 72 hours. Sepsis Labs: No results for input(s): PROCALCITON, LATICACIDVEN in the last 168 hours.  Recent Results (from the past 240 hour(s))  Culture, blood (Routine X 2) w Reflex to ID Panel     Status: None   Collection Time: 02/15/17  7:15 AM  Result Value Ref Range Status   Specimen Description BLOOD RIGHT WRIST  Final   Special Requests IN PEDIATRIC BOTTLE 3CC  Final   Culture   Final    NO GROWTH 5 DAYS Performed at [facility], [place] [address] [place], [place] [address]    Report Status 02/20/2017 FINAL  Final  MRSA PCR Screening     Status: None   Collection Time: 02/15/17  8:43 AM  Result  Value Ref Range Status   MRSA by PCR NEGATIVE NEGATIVE Final    Comment:        The GeneXpert MRSA Assay (FDA approved for NASAL specimens only), is one component of a comprehensive MRSA colonization surveillance program. It is not intended to diagnose MRSA infection nor to guide or monitor treatment for MRSA infections.   Culture, blood (Routine X 2) w Reflex to ID Panel     Status: None   Collection Time: 02/15/17  9:09 AM  Result Value Ref Range Status   Specimen Description BLOOD LEFT ARM  Final   Special Requests BOTTLES DRAWN AEROBIC AND ANAEROBIC 7 CC  Final   Culture   Final    NO GROWTH 5 DAYS Performed at [facility], [place] [address], [place], [place] [address]    Report Status 02/20/2017 FINAL  Final       Radiology Studies: No results found.    Scheduled Meds: . iopamidol      . aspirin EC  81 mg Oral Daily  . atorvastatin  40 mg Oral q1800  . buPROPion  200 mg Oral Daily  . carvedilol  12.5 mg Oral BID  . folic acid  1 mg Oral Daily  . iopamidol  30 mL Oral Once  . mouth rinse  15 mL Mouth Rinse BID  . mirtazapine  7.5 mg Oral QHS  . pantoprazole  40 mg Oral Daily  . sodium chloride flush  3 mL Intravenous Q12H  . tamsulosin  0.4 mg Oral Daily  . thiamine  100 mg Oral Daily   Continuous Infusions: . sodium chloride 50 mL/hr at 02/21/17 0032     LOS: 6 days    Time spent: 30 minutes   Dessa Phi, DO [facility] [email or web address] Password [email or web address] 02/21/2017, 11:30 AM

## 2017-02-21 NOTE — Progress Notes (Signed)
Dr. Maylene Roes notified that results of CT scan are available to review in computer.

## 2017-02-21 NOTE — Progress Notes (Signed)
Pharmacy Antibiotic Note  Joshua Henson is a 71 y.o. male admitted on 02/14/2017 with aspiration pneumonia.  Pharmacy has been consulted for zosyn dosing.  Plan: Zosyn 3.375gm IV q8h over 4h infusion - do not anticipate need for dose adjustment, pharmacy will sign-off zosyn dosing  Height: 5\' 10"  (177.8 cm) Weight: 190 lb 14.7 oz (86.6 kg) IBW/kg (Calculated) : 73  Temp (24hrs), Avg:100 F (37.8 C), Min:99 F (37.2 C), Max:102.6 F (39.2 C)   Recent Labs Lab 02/16/17 0601 02/17/17 0357 02/18/17 0355 02/19/17 0336 02/20/17 0315 02/21/17 0634  WBC 8.9 6.5 8.4 8.4 7.1  --   CREATININE  --  0.59* 0.66 0.58* 0.68 0.70    Estimated Creatinine Clearance: 90 mL/min (by C-G formula based on SCr of 0.7 mg/dL).    Allergies  Allergen Reactions  . Lisinopril     syncope  . Celebrex [Celecoxib] Rash    Antimicrobials this admission: 2/28 >> zosyn >> 3/1, resume 3/6  Dose adjustments this admission:  Microbiology results: 2/28 BCx: NGTD 2/28 MRSA PCR: neg  Thank you for allowing pharmacy to be a part of this patient's care.  Doreene Eland, PharmD, BCPS.   Pager: [phone] 02/21/2017 2:20 PM

## 2017-02-21 NOTE — Evaluation (Signed)
Clinical/Bedside Swallow Evaluation Patient Details  Name: Joshua Henson MRN: [id number] Date of Birth: 1946-04-17  Today's Date: 02/21/2017 Time: SLP Start Time (ACUTE ONLY): 18 SLP Stop Time (ACUTE ONLY): 1133 SLP Time Calculation (min) (ACUTE ONLY): 28 min  Past Medical History:  Past Medical History:  Diagnosis Date  . Acute hepatic encephalopathy   . Alcoholic cirrhosis of liver without ascites ([place])   . Anxiety state   . CAD (coronary artery disease), native coronary artery 01/10/2016   3 vessel coronary calcification noted on CT scan 04/03/15    . Depression   . Fatty liver, alcoholic [id number]  . Hypertension   . Increased ammonia level   . Insomnia   . NSTEMI (non-ST elevated myocardial infarction) ([place])   . Prostate cancer ([place]) 2010   External beam radiation (urol - Risa Grill, XRT Valere Dross)  . Stroke ([place])   . Tachycardia    Past Surgical History:  Past Surgical History:  Procedure Laterality Date  . CARDIAC CATHETERIZATION N/A 01/15/2016   Procedure: Left Heart Cath and Coronary Angiography;  Surgeon: Burnell Blanks, MD;  Location: [place] [facility];  Service: Cardiovascular;  Laterality: N/A;   HPI:  71 yo male with H/O alcohol abuse, prostate cancer, cirrhosis, hepatic encephalopathy, admitted 02/14/17 after falls at home, continued ETOH abuse, fractured Left 7th rib.  During Jan 2017 admission was followed for swallowing due to an acute reversible dysphagia s/p intubation.  RN reports coughing with liquids during this admission.   Assessment / Plan / Recommendation Clinical Impression  Pt presents with concerns for dysphagia with potential aspiration given decreased MS, consistent coughing after consumption of thin liquids and throat-clearing with nectars.  Pureed/regular solids appear to be tolerated without difficulty.  Recommend honey-thick liquids, dysphagia 3 diet for now; pt will need MBS for further evaluation.  Will schedule for next date.   SLP  Visit Diagnosis: Dysphagia, unspecified (R13.10)    Aspiration Risk  Mild aspiration risk    Diet Recommendation   dys 3/honey-thick liquids  Medication Administration: Whole meds with puree    Other  Recommendations Oral Care Recommendations: Oral care BID Other Recommendations: Order thickener from pharmacy   Follow up Recommendations  (tba)      Frequency and Duration min 2x/week  2 weeks       Prognosis        Swallow Study   General Date of Onset: 02/14/17 HPI: 71 yo male with H/O alcohol abuse, prostate cancer, cirrhosis, hepatic encephalopathy, admitted 02/14/17 after falls at home, continued ETOH abuse, fractured Left 7th rib.  During Jan 2017 admission was followed for swallowing due to an acute reversible dysphagia s/p intubation.  RN reports coughing with liquids. Type of Study: Bedside Swallow Evaluation Previous Swallow Assessment: Jan 2017 Diet Prior to this Study: Thin liquids Temperature Spikes Noted: Yes Respiratory Status: Room air History of Recent Intubation: No Behavior/Cognition: Alert;Confused Oral Cavity Assessment: Within Functional Limits Oral Care Completed by SLP: No Oral Cavity - Dentition:  (plates) Vision: Functional for self-feeding Self-Feeding Abilities: Needs assist Patient Positioning: Upright in bed Baseline Vocal Quality: Normal Volitional Cough: Strong Volitional Swallow: Able to elicit    Oral/Motor/Sensory Function Overall Oral Motor/Sensory Function: Within functional limits   Ice Chips Ice chips: Within functional limits   Thin Liquid Thin Liquid: Impaired Presentation: Straw;Cup Pharyngeal  Phase Impairments: Cough - Immediate    Nectar Thick Nectar Thick Liquid: Impaired Presentation: Cup Pharyngeal Phase Impairments: Throat Clearing - Immediate   Honey Thick  Honey Thick Liquid: Within functional limits Presentation: Cup   Puree Puree: Within functional limits   Solid   GO   Solid: Within functional limits         Juan Quam Laurice 02/21/2017,11:37 AM Estill Bamberg L. Tivis Ringer, [place] CCC/SLP Pager [phone]

## 2017-02-22 ENCOUNTER — Inpatient Hospital Stay (HOSPITAL_COMMUNITY): Payer: Medicare Other

## 2017-02-22 LAB — BASIC METABOLIC PANEL
Anion gap: 8 (ref 5–15)
BUN: 7 mg/dL (ref 6–20)
CALCIUM: 8.7 mg/dL — AB (ref 8.9–10.3)
CO2: 23 mmol/L (ref 22–32)
CREATININE: 0.73 mg/dL (ref 0.61–1.24)
Chloride: 104 mmol/L (ref 101–111)
GFR calc Af Amer: >60 mL/min (ref >60)
GLUCOSE: 104 mg/dL — AB (ref 65–99)
Potassium: 3.3 mmol/L — ABNORMAL LOW (ref 3.5–5.1)
Sodium: 135 mmol/L (ref 135–145)

## 2017-02-22 LAB — CBC WITH DIFFERENTIAL/PLATELET
BASOS ABS: 0 10*3/uL (ref 0.0–0.1)
Basophils Relative: 0 %
EOS PCT: 1 %
Eosinophils Absolute: 0.1 10*3/uL (ref 0.0–0.7)
HEMATOCRIT: 31.6 % — AB (ref 39.0–52.0)
Hemoglobin: 11 g/dL — ABNORMAL LOW (ref 13.0–17.0)
LYMPHS PCT: 15 %
Lymphs Abs: 1.1 10*3/uL (ref 0.7–4.0)
MCH: 32.4 pg (ref 26.0–34.0)
MCHC: 34.8 g/dL (ref 30.0–36.0)
MCV: 92.9 fL (ref 78.0–100.0)
MONO ABS: 1.8 10*3/uL — AB (ref 0.1–1.0)
MONOS PCT: 25 %
NEUTROS ABS: 4.2 10*3/uL (ref 1.7–7.7)
Neutrophils Relative %: 59 %
PLATELETS: 198 10*3/uL (ref 150–400)
RBC: 3.4 MIL/uL — ABNORMAL LOW (ref 4.22–5.81)
RDW: 14.1 % (ref 11.5–15.5)
WBC: 7.1 10*3/uL (ref 4.0–10.5)

## 2017-02-22 LAB — MAGNESIUM: Magnesium: 1.9 mg/dL (ref 1.7–2.4)

## 2017-02-22 MED ORDER — POTASSIUM CHLORIDE CRYS ER 20 MEQ PO TBCR
40.0000 meq | EXTENDED_RELEASE_TABLET | ORAL | Status: AC
  Administered 2017-02-22 (×2): 40 meq via ORAL
  Filled 2017-02-22 (×2): qty 2

## 2017-02-22 NOTE — Progress Notes (Signed)
PROGRESS NOTE    Joshua Henson  NAT:[id number] DOB: August 27, 1946 DOA: 02/14/2017 PCP: Mayra Neer, MD     Brief Narrative:  Joshua Henson is a 71 y.o.malewith medical history significant of alcohol abuse, stroke, NSTEMI, hypertension, depression, anxiety, alcohol cirrhosis, hepatic encephalopathy, who presents after falls and intractable nausea vomiting. Patient brought in via EMS. Initially there was a suspicion of coffee-ground emesis. Patient is alcoholic and admits to drinking a pint of vodka everyday. He started feeling bad 2 days ago after he lost his balance and fell in the bathroom. Subsequently he developed left-sided chest wall pain. As per ED notes patient was again on the floor around 5 PM yesterday when apparently he fell out of the bed. Patient has not taken any of his regular medications for the last 4 days because of excessive alcohol use. Patient also complained of urinary retention, and unable to urinate. Patient denied any abdominal pain or shortness of breath. Workup in the emergency department revealed acute alcoholic pancreatitis.   He has been treated for acute alcoholic pancreatitis with supportive care. His main issue has been alcohol withdrawal, requiring Precedex drip for short period of time. Overnight on 3/5, he developed fevers requiring CT scan to evaluate possible pneumonia vs pancreatic etiology.    Assessment & Plan:   Principal Problem:   Acute pancreatitis Active Problems:   Alcohol abuse   Alcohol withdrawal, with delirium (HCC)   Left rib fracture   Chronic alcohol abuse   Delirium tremens (HCC)   Hypokalemia   Essential hypertension   Alcohol withdrawal delirium (HCC)   Alcohol-induced acute pancreatitis  Acute alcoholic pancreatitis -Seen on CT scan as well as elevated lipase -Continue supportive care, advance diet  -Repeat CT abdomen due to new fever, peripancreatic fluid with acute pancreatitis, improved from 2/28  -Clinically  improving   Sepsis secondary to aspiration pneumonia  -Initially concerned for aspiration pneumonia at time of admission and was started on empiric Zosyn. Patient denied any cough, has been afebrile in the hospital as well as normal white blood cell count. Zosyn was discontinued -New fever overnight, CT chest obtained with new ground-glass consolidation, could be secondary to aspiration -Restart zosyn -SLP eval   Alcohol withdrawal with delirium  -Required precedex gtt. Appreciate PCCM. Currently off precedex, continue ativan prn for CIWA scale  -Folvite, thiamine  -Mentation has been slowly improving, waxing and waning   Hypokalemia -Replace and trend   Hypomagnesemia -Replace and trend  Essential hypertension -Continue Coreg, hydralazine prn   History of depression -Continue Wellbutrin, Vistaril  Urinary retention -Foley   Ascending thoracic aortic aneurysm -Measuring 4.1 cm, this will need outpatient follow-up PCP. Outpatient thoracic surgery evaluation recommended  Displaced left anterolateral seventh rib fracture -Pain control    DVT prophylaxis: lovenox Code Status: full Family Communication: no family at bedside, updated wife 3/6  Disposition Plan: pending further improvement and stabilization    Consultants:   PCCM  Procedures:   None  Antimicrobials:  Anti-infectives    Start     Dose/Rate Route Frequency Ordered Stop   02/21/17 1600  piperacillin-tazobactam (ZOSYN) IVPB 3.375 g  Status:  Discontinued     3.375 g 12.5 mL/hr over 240 Minutes Intravenous Every 8 hours 02/21/17 1417 02/21/17 1430   02/21/17 1600  piperacillin-tazobactam (ZOSYN) 3.375 g in dextrose 5 % 50 mL IVPB     3.375 g 12.5 mL/hr over 240 Minutes Intravenous Every 8 hours 02/21/17 1427     02/21/17 1430  piperacillin-tazobactam (ZOSYN)  IVPB 3.375 g  Status:  Discontinued     3.375 g 12.5 mL/hr over 240 Minutes Intravenous Every 8 hours 02/21/17 1415 02/21/17 1416    02/15/17 1600  piperacillin-tazobactam (ZOSYN) IVPB 3.375 g  Status:  Discontinued     3.375 g 12.5 mL/hr over 240 Minutes Intravenous Every 8 hours 02/15/17 1010 02/16/17 1213   02/15/17 0815  piperacillin-tazobactam (ZOSYN) IVPB 3.375 g     3.375 g 100 mL/hr over 30 Minutes Intravenous  Once 02/15/17 0812 02/15/17 0956   02/15/17 0615  levofloxacin (LEVAQUIN) IVPB 750 mg     750 mg 100 mL/hr over 90 Minutes Intravenous  Once 02/15/17 0602 02/15/17 1901         Subjective: No new complaints or events overnight. Denies any abdominal pain, nausea or vomiting. Diet has been advanced.   Objective: Vitals:   02/21/17 0645 02/21/17 1410 02/21/17 2018 02/22/17 0621  BP:  (!) 163/94 136/73 (!) 154/82  Pulse:  89 77 84  Resp:  (!) 23 (!) 21 20  Temp: (!) 102.6 F (39.2 C) 99.4 F (37.4 C) 99.8 F (37.7 C) 97.3 F (36.3 C)  TempSrc: Oral Oral Oral Oral  SpO2:  97% 93% 95%  Weight:      Height:        Intake/Output Summary (Last 24 hours) at 02/22/17 1152 Last data filed at 02/21/17 1900  Gross per 24 hour  Intake           696.67 ml  Output                0 ml  Net           696.67 ml   Filed Weights   02/15/17 0001 02/15/17 0900  Weight: 79.4 kg (175 lb) 86.6 kg (190 lb 14.7 oz)    Examination:  General exam: alert, awake Respiratory system: rhonchi, Respiratory effort normal, on [place] O2  Cardiovascular system: S1 & S2 heard, RRR. No JVD, murmurs, rubs, gallops or clicks. No pedal edema. Gastrointestinal system: Abdomen is nondistended, soft, not tender to palpation. No organomegaly or masses felt. Normal bowel sounds heard. Central nervous system: Alert, less confused today  Extremities: Symmetric  Skin: No rashes, lesions or ulcers  Data Reviewed: I have personally reviewed following labs and imaging studies  CBC:  Recent Labs Lab 02/17/17 0357 02/18/17 0355 02/19/17 0336 02/20/17 0315 02/22/17 0613  WBC 6.5 8.4 8.4 7.1 7.1  NEUTROABS 5.1 6.2 5.1 3.6 4.2    HGB 11.3* 11.9* 12.0* 12.0* 11.0*  HCT 32.1* 33.8* 33.8* 33.7* 31.6*  MCV 93.6 94.4 92.6 92.6 92.9  PLT 75* 118* 138* 151 834   Basic Metabolic Panel:  Recent Labs Lab 02/18/17 0355 02/19/17 0336 02/20/17 0315 02/21/17 0634 02/22/17 0613  NA 138 134* 137 137 135  K 2.8* 2.9* 3.1* 3.4* 3.3*  CL 110 102 104 105 104  CO2 21* 21* 23 23 23   GLUCOSE 116* 112* 107* 107* 104*  BUN 9 5* 8 9 7   CREATININE 0.66 0.58* 0.68 0.70 0.73  CALCIUM 8.1* 8.2* 8.5* 8.5* 8.7*  MG 1.4* 1.5* 1.8 1.6* 1.9   GFR: Estimated Creatinine Clearance: 90 mL/min (by C-G formula based on SCr of 0.73 mg/dL). Liver Function Tests:  Recent Labs Lab 02/16/17 0344  AST 128*  ALT 60  ALKPHOS 69  BILITOT 2.0*  PROT 6.1*  ALBUMIN 3.5   No results for input(s): LIPASE, AMYLASE in the last 168 hours. No results for input(s): AMMONIA  in the last 168 hours. Coagulation Profile: No results for input(s): INR, PROTIME in the last 168 hours. Cardiac Enzymes: No results for input(s): CKTOTAL, CKMB, CKMBINDEX, TROPONINI in the last 168 hours. BNP (last 3 results) No results for input(s): PROBNP in the last 8760 hours. HbA1C: No results for input(s): HGBA1C in the last 72 hours. CBG: No results for input(s): GLUCAP in the last 168 hours. Lipid Profile: No results for input(s): CHOL, HDL, LDLCALC, TRIG, CHOLHDL, LDLDIRECT in the last 72 hours. Thyroid Function Tests: No results for input(s): TSH, T4TOTAL, FREET4, T3FREE, THYROIDAB in the last 72 hours. Anemia Panel: No results for input(s): VITAMINB12, FOLATE, FERRITIN, TIBC, IRON, RETICCTPCT in the last 72 hours. Sepsis Labs: No results for input(s): PROCALCITON, LATICACIDVEN in the last 168 hours.  Recent Results (from the past 240 hour(s))  Culture, blood (Routine X 2) w Reflex to ID Panel     Status: None   Collection Time: 02/15/17  7:15 AM  Result Value Ref Range Status   Specimen Description BLOOD RIGHT WRIST  Final   Special Requests IN PEDIATRIC  BOTTLE 3CC  Final   Culture   Final    NO GROWTH 5 DAYS Performed at [facility], [place] [address], [place], [place] [address]    Report Status 02/20/2017 FINAL  Final  MRSA PCR Screening     Status: None   Collection Time: 02/15/17  8:43 AM  Result Value Ref Range Status   MRSA by PCR NEGATIVE NEGATIVE Final    Comment:        The GeneXpert MRSA Assay (FDA approved for NASAL specimens only), is one component of a comprehensive MRSA colonization surveillance program. It is not intended to diagnose MRSA infection nor to guide or monitor treatment for MRSA infections.   Culture, blood (Routine X 2) w Reflex to ID Panel     Status: None   Collection Time: 02/15/17  9:09 AM  Result Value Ref Range Status   Specimen Description BLOOD LEFT ARM  Final   Special Requests BOTTLES DRAWN AEROBIC AND ANAEROBIC 7 CC  Final   Culture   Final    NO GROWTH 5 DAYS Performed at [facility], [place] [address], [place], [place] [address]    Report Status 02/20/2017 FINAL  Final       Radiology Studies: Ct Chest W Contrast  Result Date: 02/21/2017 CLINICAL DATA:  Aspiration pneumonia, new fever, pancreatitis. EXAM: CT CHEST WITH CONTRAST CT ABDOMEN WITHOUT AND WITH CONTRAST TECHNIQUE: Multidetector CT imaging of the abdomen was performed without intravenous contrast. Multidetector CT imaging of the chest and abdomen was then performed during bolus administration of intravenous contrast. CONTRAST:  1 ISOVUE-300 IOPAMIDOL (ISOVUE-300) INJECTION 61% COMPARISON:  Chest CT dated 02/15/2017 CT abdomen dated 05/10/2016. FINDINGS: CT CHEST WITH CONTRAST Cardiovascular: Aortic atherosclerosis. No aortic aneurysm or dissection. Heart size is normal. No pericardial effusion. Coronary artery calcifications. No central obstructing pulmonary embolism seen. Mediastinum/Nodes: No mass or enlarged lymph nodes within the mediastinum or perihilar regions. Esophagus appears normal. Trachea and central bronchi  are unremarkable. Lungs/Pleura: Patchy ground-glass airspace opacities at each lung apex, right greater than left, with associated bronchiectasis, new compared to the chest CT of 02/15/2017 There are small bilateral pleural effusions with adjacent compressive atelectasis. Musculoskeletal: Mild degenerative spurring within the thoracic spine. No acute or suspicious osseous finding. Superficial soft tissues are unremarkable. CT ABDOMEN WITHOUT AND WITH CONTRAST Hepatobiliary: Liver is low in density suggesting fatty infiltration. Gallbladder is  contracted but otherwise unremarkable. No bile duct dilatation. Pancreas: Fluid stranding surrounding the pancreas, most prominent about the body and tail regions, compatible with acute pancreatitis, somewhat diminished compared to the earlier CT of 02/15/2017. No pseudocyst formation seen. No peripancreatic hemorrhage seen. No mass or focal lesion within the pancreas. No pancreatic ductal dilatation. Spleen: Normal in size without focal abnormality. Adrenals/Urinary Tract: Adrenal glands appear normal. Small hypodense mass exophytic to the upper pole of the right kidney, measuring 8 mm, not significantly changed compared to the earlier CT of 05/10/2016, too small to definitively characterize but most compatible with a small benign cyst. No renal stone or hydronephrosis bilaterally. Stomach/Bowel: Stomach appears normal. Abdominal portion of the bowel is normal in caliber. No bowel wall thickening or evidence of bowel wall inflammation seen. Vascular/Lymphatic: Mild atherosclerosis of the abdominal aorta. No acute appearing vascular abnormality. No enlarged lymph nodes seen. Other: No abscess collection.  No free intraperitoneal air. Musculoskeletal: Degenerative changes throughout the lumbar spine, moderate in degree. No acute or suspicious osseous finding. IMPRESSION: 1. New ground-glass opacity consolidations within each lung apex, right greater than left, highly suspicious  for atypical pneumonia. Differential includes atypical pneumonias such as viral or fungal, tuberculosis, interstitial pneumonias, edema related to volume overload/CHF, chronic interstitial diseases, hypersensitivity pneumonitis, and respiratory bronchiolitis. 2. Small bilateral pleural effusions with adjacent compressive atelectasis. 3. Peripancreatic fluid stranding/edema compatible with acute pancreatitis, improved compared to the earlier CT of 02/15/2017. No pseudocyst formation. No peripancreatic hemorrhage seen. 4. Aortic atherosclerosis. 5. Coronary artery calcifications, particularly dense within the left anterior descending coronary artery. 6. Fatty infiltration of the liver. These results will be called to the ordering clinician or representative by the Radiologist Assistant, and communication documented in the PACS or zVision Dashboard. Electronically Signed   By: Franki Cabot M.D.   On: 02/21/2017 13:22   Ct Abdomen W Contrast  Result Date: 02/21/2017 CLINICAL DATA:  Aspiration pneumonia, new fever, pancreatitis. EXAM: CT CHEST WITH CONTRAST CT ABDOMEN WITHOUT AND WITH CONTRAST TECHNIQUE: Multidetector CT imaging of the abdomen was performed without intravenous contrast. Multidetector CT imaging of the chest and abdomen was then performed during bolus administration of intravenous contrast. CONTRAST:  1 ISOVUE-300 IOPAMIDOL (ISOVUE-300) INJECTION 61% COMPARISON:  Chest CT dated 02/15/2017 CT abdomen dated 05/10/2016. FINDINGS: CT CHEST WITH CONTRAST Cardiovascular: Aortic atherosclerosis. No aortic aneurysm or dissection. Heart size is normal. No pericardial effusion. Coronary artery calcifications. No central obstructing pulmonary embolism seen. Mediastinum/Nodes: No mass or enlarged lymph nodes within the mediastinum or perihilar regions. Esophagus appears normal. Trachea and central bronchi are unremarkable. Lungs/Pleura: Patchy ground-glass airspace opacities at each lung apex, right greater than  left, with associated bronchiectasis, new compared to the chest CT of 02/15/2017 There are small bilateral pleural effusions with adjacent compressive atelectasis. Musculoskeletal: Mild degenerative spurring within the thoracic spine. No acute or suspicious osseous finding. Superficial soft tissues are unremarkable. CT ABDOMEN WITHOUT AND WITH CONTRAST Hepatobiliary: Liver is low in density suggesting fatty infiltration. Gallbladder is contracted but otherwise unremarkable. No bile duct dilatation. Pancreas: Fluid stranding surrounding the pancreas, most prominent about the body and tail regions, compatible with acute pancreatitis, somewhat diminished compared to the earlier CT of 02/15/2017. No pseudocyst formation seen. No peripancreatic hemorrhage seen. No mass or focal lesion within the pancreas. No pancreatic ductal dilatation. Spleen: Normal in size without focal abnormality. Adrenals/Urinary Tract: Adrenal glands appear normal. Small hypodense mass exophytic to the upper pole of the right kidney, measuring 8 mm, not significantly changed compared  to the earlier CT of 05/10/2016, too small to definitively characterize but most compatible with a small benign cyst. No renal stone or hydronephrosis bilaterally. Stomach/Bowel: Stomach appears normal. Abdominal portion of the bowel is normal in caliber. No bowel wall thickening or evidence of bowel wall inflammation seen. Vascular/Lymphatic: Mild atherosclerosis of the abdominal aorta. No acute appearing vascular abnormality. No enlarged lymph nodes seen. Other: No abscess collection.  No free intraperitoneal air. Musculoskeletal: Degenerative changes throughout the lumbar spine, moderate in degree. No acute or suspicious osseous finding. IMPRESSION: 1. New ground-glass opacity consolidations within each lung apex, right greater than left, highly suspicious for atypical pneumonia. Differential includes atypical pneumonias such as viral or fungal, tuberculosis,  interstitial pneumonias, edema related to volume overload/CHF, chronic interstitial diseases, hypersensitivity pneumonitis, and respiratory bronchiolitis. 2. Small bilateral pleural effusions with adjacent compressive atelectasis. 3. Peripancreatic fluid stranding/edema compatible with acute pancreatitis, improved compared to the earlier CT of 02/15/2017. No pseudocyst formation. No peripancreatic hemorrhage seen. 4. Aortic atherosclerosis. 5. Coronary artery calcifications, particularly dense within the left anterior descending coronary artery. 6. Fatty infiltration of the liver. These results will be called to the ordering clinician or representative by the Radiologist Assistant, and communication documented in the PACS or zVision Dashboard. Electronically Signed   By: Franki Cabot M.D.   On: 02/21/2017 13:22      Scheduled Meds: . aspirin EC  81 mg Oral Daily  . atorvastatin  40 mg Oral q1800  . buPROPion  200 mg Oral Daily  . carvedilol  12.5 mg Oral BID  . folic acid  1 mg Oral Daily  . iopamidol  30 mL Oral Once  . mouth rinse  15 mL Mouth Rinse BID  . mirtazapine  7.5 mg Oral QHS  . pantoprazole  40 mg Oral Daily  . piperacillin-tazobactam (ZOSYN)  IV  3.375 g Intravenous Q8H  . potassium chloride  40 mEq Oral Q4H  . sodium chloride flush  3 mL Intravenous Q12H  . tamsulosin  0.4 mg Oral Daily  . thiamine  100 mg Oral Daily   Continuous Infusions:    LOS: 7 days    Time spent: 30 minutes   Dessa Phi, DO [facility] [email or web address] Password [email or web address] 02/22/2017, 11:52 AM

## 2017-02-22 NOTE — Clinical Social Work Placement (Signed)
CSW provided SNF bed offers to patient & significant other, [name]. [name] informed CSW that they are considering [facility] or return to [employer] to [place] before making a decision.   Raynaldo Opitz, [facility] Clinical Social Worker cell #: [phone]    CLINICAL SOCIAL WORK PLACEMENT  NOTE  Date:  02/22/2017  Patient Details  Name: Joshua Henson MRN: [id number] Date of Birth: 29-Nov-1946  Clinical Social Work is seeking post-discharge placement for this patient at the [place] level of care (*CSW will initial, date and re-position this form in  chart as items are completed):      Patient/family provided with [place] Work Department's list of facilities offering this level of care within the geographic area requested by the patient (or if unable, by the patient's family).  Yes   Patient/family informed of their freedom to choose among providers that offer the needed level of care, that participate in Medicare, Medicaid or managed care program needed by the patient, have an available bed and are willing to accept the patient.  Yes   Patient/family informed of 's ownership interest in [facility] and [facility], as well as of the fact that they are under no obligation to receive care at these facilities.  PASRR submitted to EDS on       PASRR number received on       Existing PASRR number confirmed on 02/20/17     FL2 transmitted to all facilities in geographic area requested by pt/family on 02/20/17     FL2 transmitted to all facilities within larger geographic area on       Patient informed that his/her managed care company has contracts with or will negotiate with certain facilities, including the following:        Yes   Patient/family informed of bed offers received.  Patient chooses bed at       Physician recommends and patient chooses bed at       Patient to be transferred to   on  .  Patient to be transferred to facility by       Patient family notified on   of transfer.  Name of family member notified:        PHYSICIAN       Additional Comment:    _______________________________________________ Standley Brooking, LCSW 02/22/2017, 9:34 AM

## 2017-02-22 NOTE — Progress Notes (Signed)
PT Cancellation Note  Patient Details Name: NILTON LAVE MRN: [id number] DOB: 07/03/1946   Cancelled Treatment:    Reason Eval/Treat Not Completed: Fatigue/lethargy limiting ability to participate (pt sleeping soundly, did not arouse to verbal nor tactile stimulii, possibly due to Ativan. Will follow. )   Philomena Doheny 02/22/2017, 1:16 PM  [phone]

## 2017-02-22 NOTE — Progress Notes (Signed)
Occupational Therapy Treatment Patient Details Name: JOSEALFREDO ADKINS MRN: [id number] DOB: 04-29-1946 Today's Date: 02/22/2017    History of present illness 71 yo male with H/O alcohol abuse, prostate cancer, cirrhosis, hepatic encephalopathy, admitted 02/14/17 after falls at home, continued ETOH abuse, fractured Left 7th rib.   OT comments  Pt initially agreeable to OT- but then would not sit EOB and wanted to return to supine.   Follow Up Recommendations  SNF;Supervision/Assistance - 24 hour          Precautions / Restrictions Precautions Precautions: Fall       Mobility Bed Mobility Overal bed mobility: Needs Assistance Bed Mobility: Supine to Sit;Sit to Supine     Supine to sit: Max assist Sit to supine: Mod assist      Transfers          NT                ADL       Grooming: Sitting;Brushing hair;Minimal assistance Grooming Details (indicate cue type and reason): pt sat EOB briefly.  Pt needed to lie down due to fatigue.  OT provided encouragement- but pt adamant about lyeing down.                               General ADL Comments: Pt more awake this day but with limited participation.                 Cognition   Behavior During Therapy: Agitated;Anxious Overall Cognitive Status: No family/caregiver present to determine baseline cognitive functioning                  General Comments: pt. was initally was cooperative            General Comments      Pertinent Vitals/ Pain       Pain Assessment: No/denies pain            Progress Toward Goals  OT Goals(current goals can now be found in the care plan section)  Progress towards OT goals: Progressing toward goals     Plan Discharge plan remains appropriate       End of Session    OT Visit Diagnosis: Unsteadiness on feet (R26.81)   Activity Tolerance Patient limited by fatigue   Patient Left in bed;with call bell/phone within reach;with bed alarm set    Nurse Communication Mobility status        Time: 7591-6384 OT Time Calculation (min): 9 min  Charges: OT General Charges $OT Visit: 1 Procedure OT Treatments $Self Care/Home Management : 8-22 mins  [place], [place]   Payton Mccallum D 02/22/2017, 4:06 PM

## 2017-02-22 NOTE — Progress Notes (Signed)
Modified Barium Swallow Progress Note  Patient Details  Name: Joshua Henson MRN: [id number] Date of Birth: 06-19-1946  Today's Date: 02/22/2017  Modified Barium Swallow completed.  Full report located under Chart Review in the Imaging Section.  Brief recommendations include the following:  Clinical Impression  Mild oropharyngeal dysphagia noted with sensorimotor deficits.  Pt with trace laryngeal penetration of thin that was inconsistent due to discoordination of swallow.  No aspiration present nor penetration of any other consistency.  Moderate vallecular residuals present with increased viscocity due to weak propulsion/tongue base retraction.  Pt does NOT sense residuals and cued dry swallows assist to decrease residuals.  Cues to "hock" and expectorate helpful to clear.  Pt also with appearance minimal esophageal residuals with backflow and no awareness.  Recommend advance diet to dys3/ground meats/thin with strict aspiration precautions.  Of note, pt does admit to premorbid dysphagia sensing food lodging in throat.  Will follow for dysphagia management/treatment to maximize po safety.    Swallow Evaluation Recommendations       SLP Diet Recommendations: Dysphagia 3 (Mech soft) solids;Thin liquid (ground meats, extra gravy/sauce)   Liquid Administration via: Cup   Medication Administration: Whole meds with puree   Supervision: Full assist for feeding;Patient able to self feed   Compensations: Minimize environmental distractions;Follow solids with liquid;Other (Comment) (intermittent dry swallow, intermittent "hock" to clear throat)   Postural Changes: Remain semi-upright after after feeds/meals (Comment);Seated upright at 90 degrees   Oral Care Recommendations: Oral care BID        Luanna Salk, [place] [facility] SLP [phone]

## 2017-02-22 NOTE — Progress Notes (Signed)
MBS completed, full report to follow.  Pt with trace laryngeal penetration of thin that was inconsistent due to discoordination of swallow.  No aspiration present nor penetration of any other consistency.  Moderate vallecular residuals present with increased viscocity due to weak propulsion/tongue base retraction.  Pt does NOT sense residuals and cued dry swallows assist to decrease residuals.  Cues to "hock" and expectorate helpful to clear.  Pt also with appearance minimal esophageal residuals with backflow and no awareness.   Recommend advance diet to dys3/ground meats/thin with strict aspiration precautions.  Of note, pt does admit to premorbid dysphagia sensing food lodging in throat.  Will follow for dysphagia management/treatment to maximize po safety.    Joshua Henson, [place] [facility] SLP  [phone]

## 2017-02-23 DIAGNOSIS — E876 Hypokalemia: Secondary | ICD-10-CM

## 2017-02-23 DIAGNOSIS — I10 Essential (primary) hypertension: Secondary | ICD-10-CM

## 2017-02-23 DIAGNOSIS — J690 Pneumonitis due to inhalation of food and vomit: Secondary | ICD-10-CM

## 2017-02-23 DIAGNOSIS — S2232XA Fracture of one rib, left side, initial encounter for closed fracture: Secondary | ICD-10-CM

## 2017-02-23 LAB — CBC WITH DIFFERENTIAL/PLATELET
BASOS ABS: 0 10*3/uL (ref 0.0–0.1)
BASOS PCT: 0 %
EOS ABS: 0.1 10*3/uL (ref 0.0–0.7)
Eosinophils Relative: 1 %
HCT: 31.7 % — ABNORMAL LOW (ref 39.0–52.0)
Hemoglobin: 11.1 g/dL — ABNORMAL LOW (ref 13.0–17.0)
LYMPHS ABS: 1.6 10*3/uL (ref 0.7–4.0)
Lymphocytes Relative: 19 %
MCH: 33 pg (ref 26.0–34.0)
MCHC: 35 g/dL (ref 30.0–36.0)
MCV: 94.3 fL (ref 78.0–100.0)
Monocytes Absolute: 0.8 10*3/uL (ref 0.1–1.0)
Monocytes Relative: 9 %
NEUTROS PCT: 71 %
Neutro Abs: 5.9 10*3/uL (ref 1.7–7.7)
PLATELETS: 272 10*3/uL (ref 150–400)
RBC: 3.36 MIL/uL — AB (ref 4.22–5.81)
RDW: 14.2 % (ref 11.5–15.5)
WBC: 8.3 10*3/uL (ref 4.0–10.5)

## 2017-02-23 LAB — BASIC METABOLIC PANEL
Anion gap: 8 (ref 5–15)
BUN: 9 mg/dL (ref 6–20)
CO2: 22 mmol/L (ref 22–32)
Calcium: 8.8 mg/dL — ABNORMAL LOW (ref 8.9–10.3)
Chloride: 106 mmol/L (ref 101–111)
Creatinine, Ser: 0.74 mg/dL (ref 0.61–1.24)
Glucose, Bld: 107 mg/dL — ABNORMAL HIGH (ref 65–99)
POTASSIUM: 3.6 mmol/L (ref 3.5–5.1)
SODIUM: 136 mmol/L (ref 135–145)

## 2017-02-23 MED ORDER — AMOXICILLIN-POT CLAVULANATE 875-125 MG PO TABS: 1.0000 | ORAL_TABLET | Freq: Two times a day (BID) | ORAL | 0 refills | Status: AC

## 2017-02-23 MED ORDER — LORAZEPAM 1 MG PO TABS: 1.0000 mg | ORAL_TABLET | Freq: Two times a day (BID) | ORAL | 0 refills | Status: DC | PRN

## 2017-02-23 MED ORDER — CARVEDILOL 12.5 MG PO TABS: 12.5000 mg | ORAL_TABLET | Freq: Two times a day (BID) | ORAL | 0 refills | Status: AC

## 2017-02-23 MED ORDER — OXYCODONE HCL 5 MG PO TABS: 5.0000 mg | ORAL_TABLET | ORAL | 0 refills | Status: DC | PRN

## 2017-02-23 MED ORDER — SENNOSIDES-DOCUSATE SODIUM 8.6-50 MG PO TABS: 1.0000 | ORAL_TABLET | Freq: Every day | ORAL | 0 refills | Status: DC

## 2017-02-23 NOTE — Progress Notes (Signed)
Report called to [facility] at [facility] Awaiting [facility] for transport.

## 2017-02-23 NOTE — Progress Notes (Signed)
Contacted by CSW that patient's girlfriend, [name] wants to dispute the planned d/c. Contacted [name], discussed concerns with her, reviewed the process of appeal. IM letter provided for her. She is on her way to visit one of the SNF's that made a bed offer and she will decide whether she will dispute the d/c after that or accept the bed and d/c today.

## 2017-02-23 NOTE — Care Management Important Message (Signed)
Important Message  Patient Details  Name: Joshua Henson MRN: [id number] Date of Birth: 01/20/1946   Medicare Important Message Given:  Yes    Guadalupe Maple, RN 02/23/2017, 12:47 PM

## 2017-02-23 NOTE — Discharge Summary (Signed)
Discharge Summary  Joshua Henson QQI:[id number] DOB: [birth year]  PCP: Mayra Neer, MD  Admit date: 02/14/2017 Discharge date: 02/23/2017  Time spent: >59mins  Recommendations for Outpatient Follow-up:  1. F/u with PMD at [facility] for hospital discharge follow up, repeat cbc/bmp at follow up 2. Repeat cxr two view in 3-4weeks to ensure resolution of pneumonia 3.   Outpatient thoracic surgery evaluation for ascending thoracic aortic anuerysm  Discharge Diagnoses:  Active Hospital Problems   Diagnosis Date Noted  . Acute pancreatitis 02/15/2017  . Alcohol-induced acute pancreatitis   . Alcohol withdrawal delirium ([place]) 09/13/2016  . Essential hypertension 03/23/2016  . Hypokalemia   . Left rib fracture 01/11/2016  . Delirium tremens ([place]) 01/11/2016  . Chronic alcohol abuse   . Alcohol withdrawal, with delirium ([place]) 09/03/2014  . Alcohol abuse 07/02/2014    Resolved Hospital Problems   Diagnosis Date Noted Date Resolved  No resolved problems to display.    Discharge Condition: stable  Diet recommendation: heart healthy   SLP Diet Recommendations: Dysphagia 3 (Mech soft) solids;Thin liquid (ground meats, extra gravy/sauce)   Liquid Administration via: Cup   Medication Administration: Whole meds with puree   Supervision: Full assist for feeding;Patient able to self feed   Compensations: Minimize environmental distractions;Follow solids with liquid;Other (Comment) (intermittent dry swallow, intermittent "hock" to clear throat)   Postural Changes: Remain semi-upright after after feeds/meals (Comment);Seated upright at 90 degrees   Oral Care Recommendations: Oral care BID  Filed Weights   02/15/17 0001 02/15/17 0900  Weight: 79.4 kg (175 lb) 86.6 kg (190 lb 14.7 oz)    History of present illness:  HPI:  71 y.o.malewith medical history significant of alcohol abuse, stroke, and STEMI, hypertension, depression, anxiety, alcohol cirrhosis, hepatic  encephalopathy , who presents after   Falls  and intractable nausea vomiting. Patient brought in via EMS. Initially there was a suspicion of coffee-ground emesis. Patient is alcoholic and admits to drinking a pint of vodka everyday. He started feeling bad 2 days ago after he lost his balance and fell in the bathroom. Subsequently he developed left-sided chest wall pain. As per ED notes patient was again on the floor around 5 PM yesterday when apparently he fell out of the bed. Patient has not taken any of his regular medications for the last 4 days, because of excessive alcohol use. Patient also complained of urinary retention, and unable to urinate. Patient denied any abdominal pain or shortness of breath.  ED course VS temp- 97.47F, pulse 90-116, respirations 21-31, BP maintained, O2 saturations 91-100%.  Labwork reveals hemoglobin 16.7, BUN 23, Cr 1.01, lipase 1399,AST 182, ALT 90. Hemoccult negative CT scan of the chest reveals left rib fracture 2/2 fall, possible aspiration pneumonia, and acute pancreatitis. He was given Levaquin for aspiration pneumonia. Started on [employer]   . [facility] called to admit inpatient admission to telemetry bed.  He has been treated for acute alcoholic pancreatitis with supportive care , he received abx for aspiration pneumonia. His main issue has been alcohol withdrawal, requiring Precedex drip for short period of time. Overnight on 3/5, he developed fevers requiring CT scan to evaluate possible pneumonia. He is restarted back on abx, swallow eval as shown above. He is fever free for 48hr prior to discharge to [facility] Hospital Course:  Principal Problem:   Acute pancreatitis Active Problems:   Alcohol abuse   Alcohol withdrawal, with delirium ([place])   Left rib fracture   Chronic alcohol abuse   Delirium tremens ([place])  Hypokalemia   Essential hypertension   Alcohol withdrawal delirium (HCC)   Alcohol-induced acute pancreatitis  Acute alcoholic pancreatitis -Seen on  CT scan as well as elevated lipase on admission -Continue supportive care, advance diet  -Repeat CT abdomen due to new fever, peripancreatic fluid with acute pancreatitis, improved from 2/28  -Clinically improving , tolerating diet, no n/v.  Sepsis secondary to aspiration pneumonia  -Initially concerned for aspiration pneumonia at time of admission and was started on empiric Zosyn. Patient denied any cough, has been afebrile in the hospital as well as normal white blood cell count. Zosyn was discontinued -New fever on 3/5-3/6, CT chest obtained with new ground-glass consolidation, could be secondary to aspiration -Restart zosyn, barium swallow done on 3/7, result as above, he is fever free for 48hrs prior to discharge, he is discharged on augmentin for additional 5days to finish total of 7 days abx treatment, continue aspiration precaution   Alcohol withdrawal with delirium  -Required precedex gtt. Appreciate PCCM.  off precedex, continue ativan prn for CIWA scale  -Folvite, thiamine  -Mentation has been slowly improving, he is discharged on prn ativan  Hypokalemia -Replace and trend   Hypomagnesemia -Replace and trend  Essential hypertension -Continue Coreg, hydralazine prn   History of depression -Continue Wellbutrin, Vistaril  Urinary retention -required Foley , now able to void without foley, continue flomax  Ascending thoracic aortic aneurysm -Measuring 4.1 cm, this will need outpatient follow-up PCP. Outpatient thoracic surgery evaluation recommended  Displaced left anterolateral seventh rib fracture -Pain control    DVT prophylaxis: lovenox Code Status: full Family Communication: no family at bedside, updated wife 3/6  Disposition Plan: snf   Consultants:   PCCM  Procedures:   None    Discharge Exam: BP (!) 158/90   Pulse 83   Temp 99.1 F (37.3 C) (Oral)   Resp 18   Ht 5\' 10"  (1.778 m)   Wt 86.6 kg (190 lb 14.7 oz)   SpO2 90%   BMI  27.39 kg/m   General: aaox3 Cardiovascular: rrr Respiratory: CATBL  Discharge Instructions You were cared for by a hospitalist during your hospital stay. If you have any questions about your discharge medications or the care you received while you were in the hospital after you are discharged, you can call the unit and asked to speak with the hospitalist on call if the hospitalist that took care of you is not available. Once you are discharged, your primary care physician will handle any further medical issues. Please note that NO REFILLS for any discharge medications will be authorized once you are discharged, as it is imperative that you return to your primary care physician (or establish a relationship with a primary care physician if you do not have one) for your aftercare needs so that they can reassess your need for medications and monitor your lab values.  Discharge Instructions    Diet - low sodium heart healthy    Complete by:  As directed    aspiration precaution   Increase activity slowly    Complete by:  As directed      Allergies as of 02/23/2017      Reactions   Lisinopril    syncope   Celebrex [celecoxib] Rash      Medication List    TAKE these medications   amoxicillin-clavulanate 875-125 MG tablet Commonly known as:  AUGMENTIN Take 1 tablet by mouth 2 (two) times daily.   aspirin 81 MG EC tablet Take 1 tablet (81  mg total) by mouth daily.   atorvastatin 40 MG tablet Commonly known as:  LIPITOR Take 1 tablet (40 mg total) by mouth daily at 6 PM.   buPROPion 200 MG 12 hr tablet Commonly known as:  WELLBUTRIN SR Take 200 mg by mouth daily.   carvedilol 12.5 MG tablet Commonly known as:  COREG Take 1 tablet (12.5 mg total) by mouth 2 (two) times daily. What changed:  medication strength  how much to take  when to take this   cholecalciferol 1000 units tablet Commonly known as:  VITAMIN D Take 1,000 Units by mouth 2 (two) times daily.   Co Q-10 200  MG Caps Take 200 mg by mouth daily.   Fish Oil 1000 MG Caps Take 1,000 mg by mouth 2 (two) times daily.   folic acid 1 MG tablet Commonly known as:  FOLVITE take 1 tablet by mouth once daily   hydrOXYzine 10 MG tablet Commonly known as:  ATARAX/VISTARIL Take 1 tablet (10 mg total) by mouth 3 (three) times daily as needed for anxiety.   LORazepam 1 MG tablet Commonly known as:  ATIVAN Take 1 tablet (1 mg total) by mouth 2 (two) times daily as needed for anxiety.   Magnesium 500 MG Caps Take 500 mg by mouth at bedtime.   mirtazapine 7.5 MG tablet Commonly known as:  REMERON take 1 tablet by mouth at bedtime   nitroGLYCERIN 0.4 MG SL tablet Commonly known as:  NITROSTAT Place 1 tablet (0.4 mg total) under the tongue every 5 (five) minutes as needed for chest pain.   oxyCODONE 5 MG immediate release tablet Commonly known as:  Oxy IR/ROXICODONE Take 1 tablet (5 mg total) by mouth every 4 (four) hours as needed for moderate pain.   potassium chloride SA 20 MEQ tablet Commonly known as:  K-DUR,KLOR-CON take 2 tablets by mouth once daily What changed:  See the new instructions.   senna-docusate 8.6-50 MG tablet Commonly known as:  Senokot-S Take 1 tablet by mouth at bedtime.   tamsulosin 0.4 MG Caps capsule Commonly known as:  FLOMAX take 1 capsule by mouth once daily   thiamine 100 MG tablet Commonly known as:  VITAMIN B-1 Take 100 mg by mouth daily.      Allergies  Allergen Reactions  . Lisinopril     syncope  . Celebrex [Celecoxib] Rash   Follow-up Information    SHAW,KIMBERLEE, MD. Schedule an appointment as soon as possible for a visit in 3 week(s).   Specialty:  Family Medicine Why:  for close follow up of your alcohol abuse and liver disease Contact information: [address] [place]  [address] [phone]            The results of significant diagnostics from this hospitalization (including imaging, microbiology, ancillary  and laboratory) are listed below for reference.    Significant Diagnostic Studies: Ct Chest W Contrast  Result Date: 02/21/2017 CLINICAL DATA:  Aspiration pneumonia, new fever, pancreatitis. EXAM: CT CHEST WITH CONTRAST CT ABDOMEN WITHOUT AND WITH CONTRAST TECHNIQUE: Multidetector CT imaging of the abdomen was performed without intravenous contrast. Multidetector CT imaging of the chest and abdomen was then performed during bolus administration of intravenous contrast. CONTRAST:  1 ISOVUE-300 IOPAMIDOL (ISOVUE-300) INJECTION 61% COMPARISON:  Chest CT dated 02/15/2017 CT abdomen dated 05/10/2016. FINDINGS: CT CHEST WITH CONTRAST Cardiovascular: Aortic atherosclerosis. No aortic aneurysm or dissection. Heart size is normal. No pericardial effusion. Coronary artery calcifications. No central obstructing pulmonary embolism seen. Mediastinum/Nodes:  No mass or enlarged lymph nodes within the mediastinum or perihilar regions. Esophagus appears normal. Trachea and central bronchi are unremarkable. Lungs/Pleura: Patchy ground-glass airspace opacities at each lung apex, right greater than left, with associated bronchiectasis, new compared to the chest CT of 02/15/2017 There are small bilateral pleural effusions with adjacent compressive atelectasis. Musculoskeletal: Mild degenerative spurring within the thoracic spine. No acute or suspicious osseous finding. Superficial soft tissues are unremarkable. CT ABDOMEN WITHOUT AND WITH CONTRAST Hepatobiliary: Liver is low in density suggesting fatty infiltration. Gallbladder is contracted but otherwise unremarkable. No bile duct dilatation. Pancreas: Fluid stranding surrounding the pancreas, most prominent about the body and tail regions, compatible with acute pancreatitis, somewhat diminished compared to the earlier CT of 02/15/2017. No pseudocyst formation seen. No peripancreatic hemorrhage seen. No mass or focal lesion within the pancreas. No pancreatic ductal dilatation.  Spleen: Normal in size without focal abnormality. Adrenals/Urinary Tract: Adrenal glands appear normal. Small hypodense mass exophytic to the upper pole of the right kidney, measuring 8 mm, not significantly changed compared to the earlier CT of 05/10/2016, too small to definitively characterize but most compatible with a small benign cyst. No renal stone or hydronephrosis bilaterally. Stomach/Bowel: Stomach appears normal. Abdominal portion of the bowel is normal in caliber. No bowel wall thickening or evidence of bowel wall inflammation seen. Vascular/Lymphatic: Mild atherosclerosis of the abdominal aorta. No acute appearing vascular abnormality. No enlarged lymph nodes seen. Other: No abscess collection.  No free intraperitoneal air. Musculoskeletal: Degenerative changes throughout the lumbar spine, moderate in degree. No acute or suspicious osseous finding. IMPRESSION: 1. New ground-glass opacity consolidations within each lung apex, right greater than left, highly suspicious for atypical pneumonia. Differential includes atypical pneumonias such as viral or fungal, tuberculosis, interstitial pneumonias, edema related to volume overload/CHF, chronic interstitial diseases, hypersensitivity pneumonitis, and respiratory bronchiolitis. 2. Small bilateral pleural effusions with adjacent compressive atelectasis. 3. Peripancreatic fluid stranding/edema compatible with acute pancreatitis, improved compared to the earlier CT of 02/15/2017. No pseudocyst formation. No peripancreatic hemorrhage seen. 4. Aortic atherosclerosis. 5. Coronary artery calcifications, particularly dense within the left anterior descending coronary artery. 6. Fatty infiltration of the liver. These results will be called to the ordering clinician or representative by the Radiologist Assistant, and communication documented in the PACS or zVision Dashboard. Electronically Signed   By: Franki Cabot M.D.   On: 02/21/2017 13:22   Ct Chest W  Contrast  Result Date: 02/15/2017 CLINICAL DATA:  Status post fall out of bed, with left rib pain. Hemoptysis or hematemesis. Mild leukocytosis. Initial encounter. EXAM: CT CHEST WITH CONTRAST TECHNIQUE: Multidetector CT imaging of the chest was performed during intravenous contrast administration. CONTRAST:  75 mL of Isovue 300 IV contrast COMPARISON:  Chest radiograph performed 09/20/2016 FINDINGS: Cardiovascular: Scattered coronary artery calcifications are seen. There is borderline dilatation of the ascending thoracic aorta to 4.1 cm in AP dimension. Scattered calcification is noted along the descending thoracic aorta. The heart remains normal in size. The great vessels are grossly unremarkable in appearance, aside from minimal calcification along the proximal right subclavian artery. Mediastinum/Nodes: No mediastinal lymphadenopathy is seen. No pericardial effusion is identified. Mild nonspecific soft tissue inflammation is noted at the left epicardial fat pad. The thyroid gland is unremarkable. No axillary lymphadenopathy is seen. Lungs/Pleura: Patchy bibasilar airspace opacities may reflect atelectasis or mild pneumonia. The lungs are otherwise clear. No pleural effusion or pneumothorax is seen. No mass is identified. A calcified granuloma is noted at the right lung base. Upper Abdomen: There  is diffuse fatty infiltration within the liver. Diffuse fluid and edema are seen tracking about the entirety of the pancreas and the second segment of the duodenum, suspicious for acute pancreatitis. There is no definite evidence of devascularization or pseudocyst formation at this time. Scattered calcified granulomata are seen within the spleen. Nonspecific perinephric stranding is noted bilaterally. A small right renal cyst is seen. Musculoskeletal: There is a mildly displaced left anterolateral seventh rib fracture. Additional chronic bilateral rib deformities are noted. The visualized musculature is unremarkable in  appearance. IMPRESSION: 1. Mildly displaced left anterolateral seventh rib fracture. 2. Diffuse fluid and edema tracking about the entirety of the pancreas and the second segment of the duodenum, suspicious for acute pancreatitis. No definite evidence of devascularization or pseudocyst formation at this time. 3. Patchy bibasilar airspace opacities may reflect atelectasis or mild pneumonia. 4. Borderline aneurysmal dilatation of the ascending thoracic aorta to 4.1 cm in AP dimension. Recommend annual imaging followup by CTA or MRA. This recommendation follows 2010 ACCF/AHA/AATS/ACR/ASA/SCA/SCAI/SIR/STS/SVM Guidelines for the Diagnosis and Management of Patients with Thoracic Aortic Disease. Circulation. 2010; 121: e266-e369 5. Scattered coronary artery calcifications seen. 6. Mild nonspecific soft tissue inflammation at the left epicardial fat pad. 7. Diffuse fatty infiltration within the liver. 8. Small right renal cyst seen. These results were called by telephone at the time of interpretation on 02/15/2017 at 4:33 am to Dr. Everlene Balls, who verbally acknowledged these results. Electronically Signed   By: Garald Balding M.D.   On: 02/15/2017 04:35   Ct Abdomen W Contrast  Result Date: 02/21/2017 CLINICAL DATA:  Aspiration pneumonia, new fever, pancreatitis. EXAM: CT CHEST WITH CONTRAST CT ABDOMEN WITHOUT AND WITH CONTRAST TECHNIQUE: Multidetector CT imaging of the abdomen was performed without intravenous contrast. Multidetector CT imaging of the chest and abdomen was then performed during bolus administration of intravenous contrast. CONTRAST:  1 ISOVUE-300 IOPAMIDOL (ISOVUE-300) INJECTION 61% COMPARISON:  Chest CT dated 02/15/2017 CT abdomen dated 05/10/2016. FINDINGS: CT CHEST WITH CONTRAST Cardiovascular: Aortic atherosclerosis. No aortic aneurysm or dissection. Heart size is normal. No pericardial effusion. Coronary artery calcifications. No central obstructing pulmonary embolism seen. Mediastinum/Nodes: No  mass or enlarged lymph nodes within the mediastinum or perihilar regions. Esophagus appears normal. Trachea and central bronchi are unremarkable. Lungs/Pleura: Patchy ground-glass airspace opacities at each lung apex, right greater than left, with associated bronchiectasis, new compared to the chest CT of 02/15/2017 There are small bilateral pleural effusions with adjacent compressive atelectasis. Musculoskeletal: Mild degenerative spurring within the thoracic spine. No acute or suspicious osseous finding. Superficial soft tissues are unremarkable. CT ABDOMEN WITHOUT AND WITH CONTRAST Hepatobiliary: Liver is low in density suggesting fatty infiltration. Gallbladder is contracted but otherwise unremarkable. No bile duct dilatation. Pancreas: Fluid stranding surrounding the pancreas, most prominent about the body and tail regions, compatible with acute pancreatitis, somewhat diminished compared to the earlier CT of 02/15/2017. No pseudocyst formation seen. No peripancreatic hemorrhage seen. No mass or focal lesion within the pancreas. No pancreatic ductal dilatation. Spleen: Normal in size without focal abnormality. Adrenals/Urinary Tract: Adrenal glands appear normal. Small hypodense mass exophytic to the upper pole of the right kidney, measuring 8 mm, not significantly changed compared to the earlier CT of 05/10/2016, too small to definitively characterize but most compatible with a small benign cyst. No renal stone or hydronephrosis bilaterally. Stomach/Bowel: Stomach appears normal. Abdominal portion of the bowel is normal in caliber. No bowel wall thickening or evidence of bowel wall inflammation seen. Vascular/Lymphatic: Mild atherosclerosis of the abdominal aorta.  No acute appearing vascular abnormality. No enlarged lymph nodes seen. Other: No abscess collection.  No free intraperitoneal air. Musculoskeletal: Degenerative changes throughout the lumbar spine, moderate in degree. No acute or suspicious osseous  finding. IMPRESSION: 1. New ground-glass opacity consolidations within each lung apex, right greater than left, highly suspicious for atypical pneumonia. Differential includes atypical pneumonias such as viral or fungal, tuberculosis, interstitial pneumonias, edema related to volume overload/CHF, chronic interstitial diseases, hypersensitivity pneumonitis, and respiratory bronchiolitis. 2. Small bilateral pleural effusions with adjacent compressive atelectasis. 3. Peripancreatic fluid stranding/edema compatible with acute pancreatitis, improved compared to the earlier CT of 02/15/2017. No pseudocyst formation. No peripancreatic hemorrhage seen. 4. Aortic atherosclerosis. 5. Coronary artery calcifications, particularly dense within the left anterior descending coronary artery. 6. Fatty infiltration of the liver. These results will be called to the ordering clinician or representative by the Radiologist Assistant, and communication documented in the PACS or zVision Dashboard. Electronically Signed   By: Franki Cabot M.D.   On: 02/21/2017 13:22   Dg Swallowing Func-speech Pathology  Result Date: 02/22/2017 Objective Swallowing Evaluation: Type of Study: Bedside Swallow Evaluation Patient Details Name: Joshua Henson MRN: [id number] Date of Birth: 01/12/1946 Today's Date: 02/22/2017 Time: SLP Start Time (ACUTE ONLY): 1220-SLP Stop Time (ACUTE ONLY): 1245 SLP Time Calculation (min) (ACUTE ONLY): 25 min Past Medical History: Past Medical History: Diagnosis Date . Acute hepatic encephalopathy  . Alcoholic cirrhosis of liver without ascites ([place])  . Anxiety state  . CAD (coronary artery disease), native coronary artery 01/10/2016  3 vessel coronary calcification noted on CT scan 04/03/15   . Depression  . Fatty liver, alcoholic 3/76/2831 . Hypertension  . Increased ammonia level  . Insomnia  . NSTEMI (non-ST elevated myocardial infarction) ([employer])  . Prostate cancer ([place]) 2010  External beam radiation (urol - Risa Grill, XRT Valere Dross) . Stroke ([place])  . Tachycardia  Past Surgical History: Past Surgical History: Procedure Laterality Date . CARDIAC CATHETERIZATION N/A 01/15/2016  Procedure: Left Heart Cath and Coronary Angiography;  Surgeon: Burnell Blanks, MD;  Location: [facility];  Service: Cardiovascular;  Laterality: N/A; HPI: 71 yo male with H/O alcohol abuse, prostate cancer, cirrhosis, hepatic encephalopathy, admitted 02/14/17 after falls at home, continued ETOH abuse, fractured Left 7th rib.  During Jan 2017 admission was followed for swallowing due to an acute reversible dysphagia s/p intubation.  RN reports coughing with liquids. Subjective: confused Assessment / Plan / Recommendation CHL IP CLINICAL IMPRESSIONS 02/22/2017 Clinical Impression Mild oropharyngeal dysphagia noted with sensorimotor deficits.  Pt with trace laryngeal penetration of thin that was inconsistent due to discoordination of swallow.  No aspiration present nor penetration of any other consistency. Moderate vallecular residuals present with increased viscocity due to weak propulsion/tongue base retraction.  Pt does NOT sense residuals and cued dry swallows assist to decrease residuals.  Cues to "hock" and expectorate helpful to clear.  Pt also with appearance minimal esophageal residuals with backflow and no awareness.  Recommend advance diet to dys3/ground meats/thin with strict aspiration precautions.  Of note, pt does admit to premorbid dysphagia sensing food lodging in throat.  Will follow for dysphagia management/treatment to maximize po safety. Of note pt did not cough during MBS.   SLP Visit Diagnosis Dysphagia, oropharyngeal phase (R13.12) Attention and concentration deficit following -- Frontal lobe and executive function deficit following -- Impact on safety and function Mild aspiration risk Some encounter information is confidential and restricted. Go to Review Flowsheets activity to see all data.  CHL IP TREATMENT RECOMMENDATION  02/22/2017 Treatment Recommendations Therapy as outlined in treatment plan below Some encounter information is confidential and restricted. Go to Review Flowsheets activity to see all data.   Prognosis 02/22/2017 Prognosis for Safe Diet Advancement Good Barriers to Reach Goals -- Barriers/Prognosis Comment -- Some encounter information is confidential and restricted. Go to Review Flowsheets activity to see all data. CHL IP DIET RECOMMENDATION 02/22/2017 SLP Diet Recommendations Dysphagia 3 (Mech soft) solids;Thin liquid Liquid Administration via Cup Medication Administration Whole meds with puree Compensations Minimize environmental distractions;Follow solids with liquid;Other (Comment) Postural Changes Remain semi-upright after after feeds/meals (Comment);Seated upright at 90 degrees Some encounter information is confidential and restricted. Go to Review Flowsheets activity to see all data.   CHL IP OTHER RECOMMENDATIONS 02/22/2017 Recommended Consults -- Oral Care Recommendations Oral care BID Other Recommendations -- Some encounter information is confidential and restricted. Go to Review Flowsheets activity to see all data.   CHL IP FOLLOW UP RECOMMENDATIONS 02/22/2017 Follow up Recommendations None Some encounter information is confidential and restricted. Go to Review Flowsheets activity to see all data.   CHL IP FREQUENCY AND DURATION 02/22/2017 Speech Therapy Frequency (ACUTE ONLY) min 1 x/week Treatment Duration 1 week Some encounter information is confidential and restricted. Go to Review Flowsheets activity to see all data.      CHL IP ORAL PHASE 02/22/2017 Oral Phase Impaired Oral - Pudding Teaspoon -- Oral - Pudding Cup -- Oral - Honey Teaspoon -- Oral - Honey Cup -- Oral - Nectar Teaspoon Reduced posterior propulsion;Weak lingual manipulation Oral - Nectar Cup Weak lingual manipulation;Lingual pumping;Reduced posterior propulsion;Premature spillage Oral - Nectar Straw -- Oral - Thin Teaspoon Weak lingual  manipulation;Reduced posterior propulsion;Lingual pumping;Premature spillage Oral - Thin Cup Weak lingual manipulation;Lingual pumping;Reduced posterior propulsion;Premature spillage Oral - Thin Straw Weak lingual manipulation;Reduced posterior propulsion;Premature spillage Oral - Puree Weak lingual manipulation;Reduced posterior propulsion;Delayed oral transit;Premature spillage;Lingual pumping Oral - Mech Soft -- Oral - Regular Weak lingual manipulation;Reduced posterior propulsion;Premature spillage Oral - Multi-Consistency -- Oral - Pill Weak lingual manipulation;Reduced posterior propulsion;Premature spillage Oral Phase - Comment --  CHL IP PHARYNGEAL PHASE 02/22/2017 Pharyngeal Phase Impaired Pharyngeal- Pudding Teaspoon -- Pharyngeal -- Pharyngeal- Pudding Cup -- Pharyngeal -- Pharyngeal- Honey Teaspoon -- Pharyngeal -- Pharyngeal- Honey Cup -- Pharyngeal -- Pharyngeal- Nectar Teaspoon WFL Pharyngeal -- Pharyngeal- Nectar Cup WFL Pharyngeal -- Pharyngeal- Nectar Straw Reduced pharyngeal peristalsis;Reduced laryngeal elevation;Reduced tongue base retraction;Pharyngeal residue - valleculae Pharyngeal -- Pharyngeal- Thin Teaspoon Reduced laryngeal elevation;Reduced anterior laryngeal mobility;Reduced epiglottic inversion;Reduced airway/laryngeal closure;Reduced tongue base retraction;Pharyngeal residue - valleculae Pharyngeal -- Pharyngeal- Thin Cup Reduced anterior laryngeal mobility;Reduced epiglottic inversion;Reduced pharyngeal peristalsis;Reduced laryngeal elevation;Reduced tongue base retraction;Pharyngeal residue - valleculae;Penetration/Aspiration during swallow Pharyngeal Material enters airway, remains ABOVE vocal cords and not ejected out Pharyngeal- Thin Straw Reduced epiglottic inversion;Reduced anterior laryngeal mobility;Reduced laryngeal elevation;Reduced airway/laryngeal closure;Reduced tongue base retraction;Pharyngeal residue - valleculae;Penetration/Aspiration during swallow Pharyngeal Material  enters airway, remains ABOVE vocal cords and not ejected out Pharyngeal- Puree Pharyngeal residue - valleculae;Reduced tongue base retraction Pharyngeal -- Pharyngeal- Mechanical Soft Reduced pharyngeal peristalsis;Reduced epiglottic inversion;Reduced tongue base retraction Pharyngeal -- Pharyngeal- Regular -- Pharyngeal -- Pharyngeal- Multi-consistency -- Pharyngeal -- Pharyngeal- Pill Reduced epiglottic inversion;Reduced tongue base retraction Pharyngeal -- Pharyngeal Comment --  CHL IP CERVICAL ESOPHAGEAL PHASE 02/22/2017 Cervical Esophageal Phase WFL Pudding Teaspoon -- Pudding Cup -- Honey Teaspoon -- Honey Cup -- Nectar Teaspoon -- Nectar Cup -- Nectar Straw -- Thin Teaspoon -- Thin Cup -- Thin Straw -- Puree -- Mechanical Soft --  Regular -- Multi-consistency -- Pill -- Cervical Esophageal Comment -- No flowsheet data found. Luanna Salk, [place] SLP [phone]                         Microbiology: Recent Results (from the past 240 hour(s))  Culture, blood (Routine X 2) w Reflex to ID Panel     Status: None   Collection Time: 02/15/17  7:15 AM  Result Value Ref Range Status   Specimen Description BLOOD RIGHT WRIST  Final   Special Requests IN PEDIATRIC BOTTLE 3CC  Final   Culture   Final    NO GROWTH 5 DAYS Performed at [facility], [place] [address], [place], [place] [address]    Report Status 02/20/2017 FINAL  Final  MRSA PCR Screening     Status: None   Collection Time: 02/15/17  8:43 AM  Result Value Ref Range Status   MRSA by PCR NEGATIVE NEGATIVE Final    Comment:        The GeneXpert MRSA Assay (FDA approved for NASAL specimens only), is one component of a comprehensive MRSA colonization surveillance program. It is not intended to diagnose MRSA infection nor to guide or monitor treatment for MRSA infections.   Culture, blood (Routine X 2) w Reflex to ID Panel     Status: None   Collection Time: 02/15/17  9:09 AM  Result Value Ref Range Status   Specimen Description  BLOOD LEFT ARM  Final   Special Requests BOTTLES DRAWN AEROBIC AND ANAEROBIC 7 CC  Final   Culture   Final    NO GROWTH 5 DAYS Performed at [facility], [place] [address] [place], [place] [address]    Report Status 02/20/2017 FINAL  Final     Labs: Basic Metabolic Panel:  Recent Labs Lab 02/18/17 0355 02/19/17 0336 02/20/17 0315 02/21/17 0634 02/22/17 0613 02/23/17 0533  NA 138 134* 137 137 135 136  K 2.8* 2.9* 3.1* 3.4* 3.3* 3.6  CL 110 102 104 105 104 106  CO2 21* 21* 23 23 23 22   GLUCOSE 116* 112* 107* 107* 104* 107*  BUN 9 5* 8 9 7 9   CREATININE 0.66 0.58* 0.68 0.70 0.73 0.74  CALCIUM 8.1* 8.2* 8.5* 8.5* 8.7* 8.8*  MG 1.4* 1.5* 1.8 1.6* 1.9  --    Liver Function Tests: No results for input(s): AST, ALT, ALKPHOS, BILITOT, PROT, ALBUMIN in the last 168 hours. No results for input(s): LIPASE, AMYLASE in the last 168 hours. No results for input(s): AMMONIA in the last 168 hours. CBC:  Recent Labs Lab 02/18/17 0355 02/19/17 0336 02/20/17 0315 02/22/17 0613 02/23/17 0533  WBC 8.4 8.4 7.1 7.1 8.3  NEUTROABS 6.2 5.1 3.6 4.2 5.9  HGB 11.9* 12.0* 12.0* 11.0* 11.1*  HCT 33.8* 33.8* 33.7* 31.6* 31.7*  MCV 94.4 92.6 92.6 92.9 94.3  PLT 118* 138* 151 198 272   Cardiac Enzymes: No results for input(s): CKTOTAL, CKMB, CKMBINDEX, TROPONINI in the last 168 hours. BNP: BNP (last 3 results) No results for input(s): BNP in the last 8760 hours.  ProBNP (last 3 results) No results for input(s): PROBNP in the last 8760 hours.  CBG: No results for input(s): GLUCAP in the last 168 hours.     SignedFlorencia Reasons MD, PhD  [facility] 02/23/2017, 11:25 AM

## 2017-02-23 NOTE — Progress Notes (Signed)
SLP Cancellation Note  Patient Details Name: Joshua Henson MRN: [id number] DOB: 1946-01-30   Cancelled treatment:       Reason Eval/Treat Not Completed: Patient declined, no reason specified   Luanna Salk, [place] [facility] SLP [phone]

## 2017-02-23 NOTE — Progress Notes (Signed)
Physical Therapy Treatment Patient Details Name: Joshua Henson MRN: [id number] DOB: [birth year] Today's Date: 02/23/2017    History of Present Illness 71 yo male with H/O alcohol abuse, prostate cancer, cirrhosis, hepatic encephalopathy, admitted 02/14/17 after falls at home, continued ETOH abuse, fractured Left 7th rib.    PT Comments    Pt resistant to mobility initially so performed LE exercises then pt agreeable to attempt standing however felt unable to ambulate today due to weakness and unsteadiness.  Continue to recommend SNF.  Follow Up Recommendations  SNF;Supervision/Assistance - 24 hour     Equipment Recommendations  None recommended by PT    Recommendations for Other Services       Precautions / Restrictions Precautions Precautions: Fall Restrictions Weight Bearing Restrictions: No    Mobility  Bed Mobility Overal bed mobility: Needs Assistance Bed Mobility: Supine to Sit;Sit to Supine     Supine to sit: Min assist Sit to supine: Min assist   General bed mobility comments: assist for trunk per pt request, reports left rib pain  Transfers Overall transfer level: Needs assistance Equipment used: 2 person hand held assist Transfers: Sit to/from Stand Sit to Stand: Mod assist         General transfer comment: assist to rise and steady, pt only wanted HHA so assisted assisted from front- knees ready to be blocked for safety, pt unable to take steps today and returned to sitting  Ambulation/Gait                 Stairs            Wheelchair Mobility    Modified Rankin (Stroke Patients Only)       Balance Overall balance assessment: History of Falls                                  Cognition Arousal/Alertness: Awake/alert Behavior During Therapy: WFL for tasks assessed/performed Overall Cognitive Status: Within Functional Limits for tasks assessed                      Exercises General Exercises - Upper  Extremity Shoulder Flexion: AROM;Both;10 reps Elbow Flexion: AROM;Both;10 reps General Exercises - Lower Extremity Ankle Circles/Pumps: AROM;Both;10 reps Quad Sets: AROM;Both;10 reps Heel Slides: AROM;Both;10 reps Hip ABduction/ADduction: AROM;Both;10 reps Straight Leg Raises: AROM;Both;5 reps    General Comments        Pertinent Vitals/Pain Pain Assessment: Faces Faces Pain Scale: Hurts little more Pain Location: left side Pain Descriptors / Indicators: Grimacing;Guarding Pain Intervention(s): Monitored during session    Home Living                      Prior Function            PT Goals (current goals can now be found in the care plan section) Progress towards PT goals: Progressing toward goals    Frequency    Min 3X/week      PT Plan Current plan remains appropriate    Co-evaluation             End of Session   Activity Tolerance: Patient limited by fatigue Patient left: in bed;with call bell/phone within reach;with bed alarm set   PT Visit Diagnosis: Muscle weakness (generalized) (M62.81)     Time: 1040-1059 PT Time Calculation (min) (ACUTE ONLY): 19 min  Charges:  $Therapeutic Activity: 8-22 mins  G Codes:       Taneka Espiritu,KATHrine E 02/23/2017, 11:35 AM Carmelia Bake, PT, DPT 02/23/2017 Pager: [phone]

## 2017-02-23 NOTE — Progress Notes (Signed)
Plan for d/c to SNF, discharge planning per CSW. [phone] 

## 2017-02-23 NOTE — Clinical Social Work Placement (Signed)
Patient is set to discharge to [facility] today. Patient & significant other, [name] made aware. Discharge packet given to RN, Lesly Rubenstein. PTAR called for transport.     Raynaldo Opitz, [facility] Clinical Social Worker cell #: [phone]    CLINICAL SOCIAL WORK PLACEMENT  NOTE  Date:  02/23/2017  Patient Details  Name: Joshua Henson MRN: [id number] Date of Birth: [birth year]  Clinical Social Work is seeking post-discharge placement for this patient at the [place] level of care (*CSW will initial, date and re-position this form in  chart as items are completed):      Patient/family provided with [place] Work Department's list of facilities offering this level of care within the geographic area requested by the patient (or if unable, by the patient's family).  Yes   Patient/family informed of their freedom to choose among providers that offer the needed level of care, that participate in Medicare, Medicaid or managed care program needed by the patient, have an available bed and are willing to accept the patient.  Yes   Patient/family informed of [facility]'s ownership interest in [facility] and [facility], as well as of the fact that they are under no obligation to receive care at these facilities.  PASRR submitted to EDS on       PASRR number received on       Existing PASRR number confirmed on 02/20/17     FL2 transmitted to all facilities in geographic area requested by pt/family on 02/20/17     FL2 transmitted to all facilities within larger geographic area on       Patient informed that his/her managed care company has contracts with or will negotiate with certain facilities, including the following:        Yes   Patient/family informed of bed offers received.  Patient chooses bed at [facility]     Physician recommends and patient chooses bed at      Patient to be transferred  to [facility] on 02/23/17.  Patient to be transferred to facility by PTAR     Patient family notified on 02/23/17 of transfer.  Name of family member notified:  patient's significant other, Michelle via phone     PHYSICIAN       Additional Comment:    _______________________________________________ Standley Brooking, LCSW 02/23/2017, 3:50 PM

## 2017-02-28 DIAGNOSIS — F419 Anxiety disorder, unspecified: Secondary | ICD-10-CM | POA: Diagnosis not present

## 2017-02-28 DIAGNOSIS — F329 Major depressive disorder, single episode, unspecified: Secondary | ICD-10-CM | POA: Diagnosis not present

## 2017-03-02 DIAGNOSIS — M6281 Muscle weakness (generalized): Secondary | ICD-10-CM | POA: Diagnosis not present

## 2017-03-02 DIAGNOSIS — R278 Other lack of coordination: Secondary | ICD-10-CM | POA: Diagnosis not present

## 2017-03-02 DIAGNOSIS — W19XXXA Unspecified fall, initial encounter: Secondary | ICD-10-CM | POA: Diagnosis not present

## 2017-03-02 DIAGNOSIS — Z9181 History of falling: Secondary | ICD-10-CM | POA: Diagnosis not present

## 2017-03-17 DIAGNOSIS — J189 Pneumonia, unspecified organism: Secondary | ICD-10-CM | POA: Diagnosis not present

## 2017-11-09 ENCOUNTER — Other Ambulatory Visit: Payer: Self-pay

## 2017-11-09 ENCOUNTER — Emergency Department (HOSPITAL_COMMUNITY): Payer: Medicare Other

## 2017-11-09 ENCOUNTER — Inpatient Hospital Stay (HOSPITAL_COMMUNITY)
Admission: EM | Admit: 2017-11-09 | Discharge: 2017-11-21 | DRG: 896 | Disposition: A | Discharge status: Skilled Nursing Facility | Payer: Medicare Other | Attending: Internal Medicine | Admitting: Internal Medicine

## 2017-11-09 ENCOUNTER — Encounter (HOSPITAL_COMMUNITY): Payer: Self-pay | Admitting: *Deleted

## 2017-11-09 DIAGNOSIS — E860 Dehydration: Secondary | ICD-10-CM | POA: Diagnosis present

## 2017-11-09 DIAGNOSIS — R109 Unspecified abdominal pain: Secondary | ICD-10-CM | POA: Diagnosis not present

## 2017-11-09 DIAGNOSIS — R4182 Altered mental status, unspecified: Secondary | ICD-10-CM

## 2017-11-09 DIAGNOSIS — I2510 Atherosclerotic heart disease of native coronary artery without angina pectoris: Secondary | ICD-10-CM | POA: Diagnosis present

## 2017-11-09 DIAGNOSIS — Y905 Blood alcohol level of 100-119 mg/100 ml: Secondary | ICD-10-CM | POA: Diagnosis present

## 2017-11-09 DIAGNOSIS — F329 Major depressive disorder, single episode, unspecified: Secondary | ICD-10-CM | POA: Diagnosis present

## 2017-11-09 DIAGNOSIS — Z809 Family history of malignant neoplasm, unspecified: Secondary | ICD-10-CM

## 2017-11-09 DIAGNOSIS — E872 Acidosis: Secondary | ICD-10-CM | POA: Diagnosis present

## 2017-11-09 DIAGNOSIS — Z4682 Encounter for fitting and adjustment of non-vascular catheter: Secondary | ICD-10-CM | POA: Diagnosis not present

## 2017-11-09 DIAGNOSIS — Z8673 Personal history of transient ischemic attack (TIA), and cerebral infarction without residual deficits: Secondary | ICD-10-CM

## 2017-11-09 DIAGNOSIS — Z452 Encounter for adjustment and management of vascular access device: Secondary | ICD-10-CM | POA: Diagnosis not present

## 2017-11-09 DIAGNOSIS — K7030 Alcoholic cirrhosis of liver without ascites: Secondary | ICD-10-CM | POA: Diagnosis present

## 2017-11-09 DIAGNOSIS — Z974 Presence of external hearing-aid: Secondary | ICD-10-CM

## 2017-11-09 DIAGNOSIS — J9600 Acute respiratory failure, unspecified whether with hypoxia or hypercapnia: Secondary | ICD-10-CM

## 2017-11-09 DIAGNOSIS — G4700 Insomnia, unspecified: Secondary | ICD-10-CM | POA: Diagnosis not present

## 2017-11-09 DIAGNOSIS — K760 Fatty (change of) liver, not elsewhere classified: Secondary | ICD-10-CM | POA: Diagnosis present

## 2017-11-09 DIAGNOSIS — H9190 Unspecified hearing loss, unspecified ear: Secondary | ICD-10-CM | POA: Diagnosis present

## 2017-11-09 DIAGNOSIS — J189 Pneumonia, unspecified organism: Secondary | ICD-10-CM | POA: Diagnosis present

## 2017-11-09 DIAGNOSIS — J690 Pneumonitis due to inhalation of food and vomit: Secondary | ICD-10-CM

## 2017-11-09 DIAGNOSIS — F10231 Alcohol dependence with withdrawal delirium: Principal | ICD-10-CM | POA: Diagnosis present

## 2017-11-09 DIAGNOSIS — E876 Hypokalemia: Secondary | ICD-10-CM | POA: Diagnosis present

## 2017-11-09 DIAGNOSIS — Z8546 Personal history of malignant neoplasm of prostate: Secondary | ICD-10-CM

## 2017-11-09 DIAGNOSIS — D539 Nutritional anemia, unspecified: Secondary | ICD-10-CM | POA: Diagnosis present

## 2017-11-09 DIAGNOSIS — F10239 Alcohol dependence with withdrawal, unspecified: Secondary | ICD-10-CM | POA: Diagnosis present

## 2017-11-09 DIAGNOSIS — I252 Old myocardial infarction: Secondary | ICD-10-CM | POA: Diagnosis not present

## 2017-11-09 DIAGNOSIS — H1132 Conjunctival hemorrhage, left eye: Secondary | ICD-10-CM | POA: Diagnosis present

## 2017-11-09 DIAGNOSIS — G8929 Other chronic pain: Secondary | ICD-10-CM | POA: Diagnosis present

## 2017-11-09 DIAGNOSIS — J90 Pleural effusion, not elsewhere classified: Secondary | ICD-10-CM | POA: Diagnosis not present

## 2017-11-09 DIAGNOSIS — F10931 Alcohol use, unspecified with withdrawal delirium: Secondary | ICD-10-CM | POA: Diagnosis present

## 2017-11-09 DIAGNOSIS — I10 Essential (primary) hypertension: Secondary | ICD-10-CM | POA: Diagnosis present

## 2017-11-09 DIAGNOSIS — E8889 Other specified metabolic disorders: Secondary | ICD-10-CM

## 2017-11-09 DIAGNOSIS — Z8249 Family history of ischemic heart disease and other diseases of the circulatory system: Secondary | ICD-10-CM

## 2017-11-09 DIAGNOSIS — F1410 Cocaine abuse, uncomplicated: Secondary | ICD-10-CM | POA: Diagnosis present

## 2017-11-09 DIAGNOSIS — D509 Iron deficiency anemia, unspecified: Secondary | ICD-10-CM | POA: Diagnosis present

## 2017-11-09 DIAGNOSIS — Z7983 Long term (current) use of bisphosphonates: Secondary | ICD-10-CM

## 2017-11-09 DIAGNOSIS — F17210 Nicotine dependence, cigarettes, uncomplicated: Secondary | ICD-10-CM | POA: Diagnosis present

## 2017-11-09 DIAGNOSIS — R079 Chest pain, unspecified: Secondary | ICD-10-CM | POA: Diagnosis not present

## 2017-11-09 DIAGNOSIS — K700 Alcoholic fatty liver: Secondary | ICD-10-CM | POA: Diagnosis present

## 2017-11-09 DIAGNOSIS — F411 Generalized anxiety disorder: Secondary | ICD-10-CM | POA: Diagnosis present

## 2017-11-09 DIAGNOSIS — Z888 Allergy status to other drugs, medicaments and biological substances status: Secondary | ICD-10-CM

## 2017-11-09 DIAGNOSIS — J9602 Acute respiratory failure with hypercapnia: Secondary | ICD-10-CM

## 2017-11-09 DIAGNOSIS — R918 Other nonspecific abnormal finding of lung field: Secondary | ICD-10-CM | POA: Diagnosis not present

## 2017-11-09 DIAGNOSIS — Z79899 Other long term (current) drug therapy: Secondary | ICD-10-CM

## 2017-11-09 DIAGNOSIS — Z4659 Encounter for fitting and adjustment of other gastrointestinal appliance and device: Secondary | ICD-10-CM

## 2017-11-09 DIAGNOSIS — Z01818 Encounter for other preprocedural examination: Secondary | ICD-10-CM

## 2017-11-09 DIAGNOSIS — K7200 Acute and subacute hepatic failure without coma: Secondary | ICD-10-CM | POA: Diagnosis present

## 2017-11-09 DIAGNOSIS — F10939 Alcohol use, unspecified with withdrawal, unspecified: Secondary | ICD-10-CM

## 2017-11-09 DIAGNOSIS — R945 Abnormal results of liver function studies: Secondary | ICD-10-CM | POA: Diagnosis not present

## 2017-11-09 DIAGNOSIS — Z7982 Long term (current) use of aspirin: Secondary | ICD-10-CM

## 2017-11-09 DIAGNOSIS — R7989 Other specified abnormal findings of blood chemistry: Secondary | ICD-10-CM

## 2017-11-09 DIAGNOSIS — Z9181 History of falling: Secondary | ICD-10-CM

## 2017-11-09 DIAGNOSIS — R0603 Acute respiratory distress: Secondary | ICD-10-CM | POA: Diagnosis not present

## 2017-11-09 DIAGNOSIS — Z923 Personal history of irradiation: Secondary | ICD-10-CM

## 2017-11-09 LAB — COMPREHENSIVE METABOLIC PANEL
ALT: 30 U/L (ref 17–63)
AST: 90 U/L — ABNORMAL HIGH (ref 15–41)
Albumin: 3.8 g/dL (ref 3.5–5.0)
Alkaline Phosphatase: 81 U/L (ref 38–126)
Anion gap: 21 — ABNORMAL HIGH (ref 5–15)
BILIRUBIN TOTAL: 1.2 mg/dL (ref 0.3–1.2)
BUN: 9 mg/dL (ref 6–20)
CALCIUM: 8.8 mg/dL — AB (ref 8.9–10.3)
CO2: 16 mmol/L — AB (ref 22–32)
CREATININE: 0.92 mg/dL (ref 0.61–1.24)
Chloride: 101 mmol/L (ref 101–111)
Glucose, Bld: 110 mg/dL — ABNORMAL HIGH (ref 65–99)
Potassium: 3.2 mmol/L — ABNORMAL LOW (ref 3.5–5.1)
Sodium: 138 mmol/L (ref 135–145)
Total Protein: 7 g/dL (ref 6.5–8.1)

## 2017-11-09 LAB — CBC WITH DIFFERENTIAL/PLATELET
Basophils Absolute: 0 10*3/uL (ref 0.0–0.1)
Basophils Relative: 1 %
Eosinophils Absolute: 0 10*3/uL (ref 0.0–0.7)
Eosinophils Relative: 1 %
HEMATOCRIT: 36 % — AB (ref 39.0–52.0)
HEMOGLOBIN: 13 g/dL (ref 13.0–17.0)
LYMPHS ABS: 1 10*3/uL (ref 0.7–4.0)
LYMPHS PCT: 21 %
MCH: 36 pg — AB (ref 26.0–34.0)
MCHC: 36.1 g/dL — ABNORMAL HIGH (ref 30.0–36.0)
MCV: 99.7 fL (ref 78.0–100.0)
MONOS PCT: 8 %
Monocytes Absolute: 0.4 10*3/uL (ref 0.1–1.0)
NEUTROS PCT: 69 %
Neutro Abs: 3.2 10*3/uL (ref 1.7–7.7)
Platelets: 154 10*3/uL (ref 150–400)
RBC: 3.61 MIL/uL — AB (ref 4.22–5.81)
RDW: 14.3 % (ref 11.5–15.5)
WBC: 4.7 10*3/uL (ref 4.0–10.5)

## 2017-11-09 LAB — BLOOD GAS, VENOUS
O2 SAT: 45.1 %
PATIENT TEMPERATURE: 98.6
PH VEN: 7.59 — AB (ref 7.250–7.430)

## 2017-11-09 LAB — I-STAT CG4 LACTIC ACID, ED
LACTIC ACID, VENOUS: 8.04 mmol/L — AB (ref 0.5–1.9)
LACTIC ACID, VENOUS: 3.51 mmol/L — AB (ref 0.5–1.9)

## 2017-11-09 LAB — RAPID URINE DRUG SCREEN, HOSP PERFORMED
AMPHETAMINES: NOT DETECTED
Barbiturates: NOT DETECTED
Benzodiazepines: POSITIVE — AB
Cocaine: NOT DETECTED
Opiates: POSITIVE — AB
Tetrahydrocannabinol: NOT DETECTED

## 2017-11-09 LAB — MAGNESIUM: Magnesium: 1.1 mg/dL — ABNORMAL LOW (ref 1.7–2.4)

## 2017-11-09 LAB — LACTIC ACID, PLASMA
LACTIC ACID, VENOUS: 1.2 mmol/L (ref 0.5–1.9)
Lactic Acid, Venous: 2.5 mmol/L (ref 0.5–1.9)

## 2017-11-09 LAB — ETHANOL: ALCOHOL ETHYL (B): 118 mg/dL — AB (ref <10)

## 2017-11-09 LAB — LIPASE, BLOOD: Lipase: 17 U/L (ref 11–51)

## 2017-11-09 MED ORDER — SODIUM CHLORIDE 0.9 % IV BOLUS (SEPSIS)
1000.0000 mL | Freq: Once | INTRAVENOUS | Status: AC
Start: 2017-11-09 — End: 2017-11-09
  Administered 2017-11-09: 1000 mL via INTRAVENOUS

## 2017-11-09 MED ORDER — ONDANSETRON HCL 4 MG PO TABS: 4.0000 mg | ORAL_TABLET | Freq: Four times a day (QID) | ORAL | Status: DC | PRN

## 2017-11-09 MED ORDER — HALOPERIDOL LACTATE 5 MG/ML IJ SOLN
2.0000 mg | Freq: Four times a day (QID) | INTRAMUSCULAR | Status: DC | PRN
  Administered 2017-11-09: 2 mg via INTRAVENOUS
  Filled 2017-11-09 (×2): qty 1

## 2017-11-09 MED ORDER — MIRTAZAPINE 15 MG PO TABS
7.5000 mg | ORAL_TABLET | Freq: Every day | ORAL | Status: DC
  Administered 2017-11-09: 7.5 mg via ORAL
  Filled 2017-11-09: qty 1

## 2017-11-09 MED ORDER — LORAZEPAM 1 MG PO TABS: 0.0000 mg | ORAL_TABLET | Freq: Four times a day (QID) | ORAL | Status: DC

## 2017-11-09 MED ORDER — SODIUM CHLORIDE 0.9 % IV BOLUS (SEPSIS)
1000.0000 mL | Freq: Once | INTRAVENOUS | Status: AC
  Administered 2017-11-09: 1000 mL via INTRAVENOUS

## 2017-11-09 MED ORDER — FOLIC ACID 1 MG PO TABS: 1.0000 mg | ORAL_TABLET | Freq: Every day | ORAL | Status: DC

## 2017-11-09 MED ORDER — POTASSIUM CHLORIDE CRYS ER 20 MEQ PO TBCR: 40.0000 meq | EXTENDED_RELEASE_TABLET | Freq: Once | ORAL | Status: DC

## 2017-11-09 MED ORDER — LORAZEPAM 2 MG/ML IJ SOLN
0.0000 mg | Freq: Four times a day (QID) | INTRAMUSCULAR | Status: DC
  Administered 2017-11-09: 2 mg via INTRAVENOUS
  Filled 2017-11-09: qty 1

## 2017-11-09 MED ORDER — LORAZEPAM 1 MG PO TABS
0.0000 mg | ORAL_TABLET | Freq: Two times a day (BID) | ORAL | Status: DC
Start: 2017-11-11 — End: 2017-11-09

## 2017-11-09 MED ORDER — THIAMINE HCL 100 MG/ML IJ SOLN
100.0000 mg | Freq: Every day | INTRAMUSCULAR | Status: DC
  Administered 2017-11-09: 100 mg via INTRAVENOUS
  Filled 2017-11-09: qty 2

## 2017-11-09 MED ORDER — LORAZEPAM 2 MG/ML IJ SOLN
2.0000 mg | INTRAMUSCULAR | Status: DC | PRN
  Administered 2017-11-09 – 2017-11-10 (×6): 3 mg via INTRAVENOUS
  Administered 2017-11-10: 2 mg via INTRAVENOUS
  Filled 2017-11-09 (×5): qty 2
  Filled 2017-11-09: qty 1

## 2017-11-09 MED ORDER — ADULT MULTIVITAMIN W/MINERALS CH: 1.0000 | ORAL_TABLET | Freq: Every day | ORAL | Status: DC

## 2017-11-09 MED ORDER — TAMSULOSIN HCL 0.4 MG PO CAPS
0.4000 mg | ORAL_CAPSULE | Freq: Every day | ORAL | Status: DC
  Administered 2017-11-12 – 2017-11-21 (×10): 0.4 mg via ORAL
  Filled 2017-11-09 (×11): qty 1

## 2017-11-09 MED ORDER — OXYCODONE HCL 5 MG PO TABS
5.0000 mg | ORAL_TABLET | ORAL | Status: DC | PRN
  Administered 2017-11-09 – 2017-11-10 (×3): 5 mg via ORAL
  Filled 2017-11-09 (×3): qty 1

## 2017-11-09 MED ORDER — LORAZEPAM 2 MG/ML IJ SOLN
1.0000 mg | Freq: Once | INTRAMUSCULAR | Status: AC
  Administered 2017-11-09: 1 mg via INTRAVENOUS
  Filled 2017-11-09: qty 1

## 2017-11-09 MED ORDER — LORAZEPAM 2 MG/ML IJ SOLN: 0.0000 mg | Freq: Two times a day (BID) | INTRAMUSCULAR | Status: DC

## 2017-11-09 MED ORDER — MAGNESIUM SULFATE 2 GM/50ML IV SOLN
2.0000 g | Freq: Once | INTRAVENOUS | Status: AC
  Administered 2017-11-09: 2 g via INTRAVENOUS
  Filled 2017-11-09: qty 50

## 2017-11-09 MED ORDER — CYCLOBENZAPRINE HCL 10 MG PO TABS
10.0000 mg | ORAL_TABLET | Freq: Once | ORAL | Status: AC
  Administered 2017-11-09: 10 mg via ORAL
  Filled 2017-11-09: qty 1

## 2017-11-09 MED ORDER — SODIUM CHLORIDE 0.9 % IV SOLN
INTRAVENOUS | Status: DC
  Administered 2017-11-09: 18:00:00 via INTRAVENOUS

## 2017-11-09 MED ORDER — FOLIC ACID 1 MG PO TABS
1.0000 mg | ORAL_TABLET | Freq: Every day | ORAL | Status: DC
  Filled 2017-11-09: qty 1

## 2017-11-09 MED ORDER — VITAMIN B-1 100 MG PO TABS
100.0000 mg | ORAL_TABLET | Freq: Every day | ORAL | Status: DC
  Administered 2017-11-12 – 2017-11-14 (×3): 100 mg via ORAL
  Filled 2017-11-09 (×4): qty 1

## 2017-11-09 MED ORDER — CLONIDINE HCL 0.1 MG PO TABS
0.1000 mg | ORAL_TABLET | Freq: Once | ORAL | Status: AC
  Administered 2017-11-09: 0.1 mg via ORAL
  Filled 2017-11-09: qty 1

## 2017-11-09 MED ORDER — MAGNESIUM OXIDE 400 (241.3 MG) MG PO TABS
400.0000 mg | ORAL_TABLET | Freq: Every day | ORAL | Status: DC
  Administered 2017-11-09: 400 mg via ORAL
  Filled 2017-11-09: qty 1

## 2017-11-09 MED ORDER — HYDROXYZINE HCL 10 MG PO TABS
10.0000 mg | ORAL_TABLET | Freq: Three times a day (TID) | ORAL | Status: DC | PRN
  Administered 2017-11-09 – 2017-11-19 (×3): 10 mg via ORAL
  Filled 2017-11-09 (×5): qty 1

## 2017-11-09 MED ORDER — VITAMIN D3 25 MCG (1000 UNIT) PO TABS
1000.0000 [IU] | ORAL_TABLET | Freq: Two times a day (BID) | ORAL | Status: DC
  Administered 2017-11-09: 1000 [IU] via ORAL
  Filled 2017-11-09 (×2): qty 1

## 2017-11-09 MED ORDER — LORAZEPAM 2 MG/ML IJ SOLN
INTRAMUSCULAR | Status: AC
  Filled 2017-11-09: qty 2

## 2017-11-09 MED ORDER — MORPHINE SULFATE (PF) 4 MG/ML IV SOLN
1.0000 mg | Freq: Once | INTRAVENOUS | Status: AC
  Administered 2017-11-09: 1 mg via INTRAVENOUS
  Filled 2017-11-09: qty 1

## 2017-11-09 MED ORDER — CARVEDILOL 12.5 MG PO TABS
12.5000 mg | ORAL_TABLET | Freq: Two times a day (BID) | ORAL | Status: DC
  Administered 2017-11-09: 12.5 mg via ORAL
  Filled 2017-11-09 (×2): qty 1

## 2017-11-09 MED ORDER — CO Q-10 200 MG PO CAPS: 200.0000 mg | ORAL_CAPSULE | Freq: Every day | ORAL | Status: DC

## 2017-11-09 MED ORDER — ASPIRIN EC 81 MG PO TBEC
81.0000 mg | DELAYED_RELEASE_TABLET | Freq: Every day | ORAL | Status: DC
  Filled 2017-11-09: qty 1

## 2017-11-09 MED ORDER — ACETAMINOPHEN 325 MG PO TABS
650.0000 mg | ORAL_TABLET | Freq: Once | ORAL | Status: AC
  Administered 2017-11-09: 650 mg via ORAL
  Filled 2017-11-09: qty 2

## 2017-11-09 MED ORDER — ENOXAPARIN SODIUM 40 MG/0.4ML ~~LOC~~ SOLN
40.0000 mg | SUBCUTANEOUS | Status: DC
  Administered 2017-11-09 – 2017-11-17 (×9): 40 mg via SUBCUTANEOUS
  Filled 2017-11-09 (×9): qty 0.4

## 2017-11-09 MED ORDER — VITAMIN B-1 100 MG PO TABS: 100.0000 mg | ORAL_TABLET | Freq: Every day | ORAL | Status: DC

## 2017-11-09 MED ORDER — ONDANSETRON HCL 4 MG/2ML IJ SOLN
4.0000 mg | Freq: Four times a day (QID) | INTRAMUSCULAR | Status: DC | PRN
  Administered 2017-11-09: 4 mg via INTRAVENOUS
  Filled 2017-11-09: qty 2

## 2017-11-09 NOTE — ED Triage Notes (Addendum)
Pt arrives POV, states he is withdrawing from ETOH, last drink 2-3 days ago, states he is a heavy drinker. Generalized pain and tremors.PT IS HOH  Pt smells of ETOH

## 2017-11-09 NOTE — H&P (Signed)
History and Physical    Joshua Henson OAC:[id number] DOB: July 12, 1946 DOA: 11/09/2017  Referring MD/NP/PA: er PCP: Mayra Neer, MD Outpatient Specialists:  Patient coming from: home  Chief Complaint: withdrawal  HPI: Joshua Henson is a 71 y.o. male with medical history significant of alcohol abuse, alcoholic liver cirrhosis,anxiety/depression, chronic pain.  He presents to the ER stating he is withdrawing from alcohol.  He states his last drink was 2-3 days ago yet his alcohol level is >100.  He has been drinking 1/2 pint of vodka for an extended period of time.  He states he is doing this because his back hurts and he is out of pain medications.  Patient says he had a cyst on his back and is unable to have surgery to remove it and it causes him pain.  No fever, no N/V, no SI/HI  In the ER, his alcohol level was >100 and he was found to have tremors.  His lactic acid was also found to be elevated.  His K and Mg were low.  He was given 2L IVF and 2 mg IV Ativan along with thiamine and magnesium with some improvement.  Hospitalist were asked to admit for alcohol withdrawal   Review of Systems: all systems reviewed, negative unless stated above in HPI   Past Medical History:  Diagnosis Date  . Acute hepatic encephalopathy   . Alcoholic cirrhosis of liver without ascites ([place])   . Anxiety state   . CAD (coronary artery disease), native coronary artery 01/10/2016   3 vessel coronary calcification noted on CT scan 04/03/15    . Depression   . Fatty liver, alcoholic 0/09/9322  . Hypertension   . Increased ammonia level   . Insomnia   . NSTEMI (non-ST elevated myocardial infarction) ([place])   . Prostate cancer ([place]) 2010   External beam radiation (urol - Risa Grill, XRT Valere Dross)  . Stroke ([place])   . Tachycardia     Past Surgical History:  Procedure Laterality Date  . CARDIAC CATHETERIZATION N/A 01/15/2016   Procedure: Left Heart Cath and Coronary Angiography;  Surgeon: Burnell Blanks, MD;  Location: [place] [facility];  Service: Cardiovascular;  Laterality: N/A;     reports that he has been smoking cigarettes.  He has a 15.00 pack-year smoking history. he has never used smokeless tobacco. He reports that he drinks about 33.6 oz of alcohol per week. He reports that he does not use drugs.  Allergies  Allergen Reactions  . Lisinopril     syncope  . Celebrex [Celecoxib] Rash    Family History  Problem Relation Age of Onset  . Cancer Mother        esophageal  . Hypertension Mother   . Diabetes Mother   . Cancer Father        prostate  . Heart disease Maternal Grandfather        MI  . Cancer Paternal Grandfather        prostate     Prior to Admission medications   Medication Sig Start Date End Date Taking? Authorizing Provider  aspirin 81 MG EC tablet Take 1 tablet (81 mg total) by mouth daily. 07/11/14   Angiulli, Lavon Paganini, PA-C  atorvastatin (LIPITOR) 40 MG tablet Take 1 tablet (40 mg total) by mouth daily at 6 PM. 01/19/16   Allie Bossier, MD  buPROPion [facility] SR) 200 MG 12 hr tablet Take 200 mg by mouth daily.    [provider]  carvedilol (COREG) 12.5 MG tablet Take 1 tablet (12.5 mg total) by mouth 2 (two) times daily. 02/23/17   Florencia Reasons, MD  cholecalciferol (VITAMIN D) 1000 units tablet Take 1,000 Units by mouth 2 (two) times daily.     [provider]  Coenzyme Q10 (CO Q-10) 200 MG CAPS Take 200 mg by mouth daily.    [provider]  folic acid (FOLVITE) 1 MG tablet take 1 tablet by mouth once daily 01/12/17   Medina-Vargas, Monina C, NP  hydrOXYzine (ATARAX/VISTARIL) 10 MG tablet Take 1 tablet (10 mg total) by mouth 3 (three) times daily as needed for anxiety. 05/13/16   Velvet Bathe, MD  LORazepam (ATIVAN) 1 MG tablet Take 1 tablet (1 mg total) by mouth 2 (two) times daily as needed for anxiety. 02/23/17   Florencia Reasons, MD  Magnesium 500 MG CAPS Take 500 mg by mouth at bedtime.    [provider]  mirtazapine  (REMERON) 7.5 MG tablet take 1 tablet by mouth at bedtime 01/12/17   Medina-Vargas, Monina C, NP  nitroGLYCERIN (NITROSTAT) 0.4 MG SL tablet Place 1 tablet (0.4 mg total) under the tongue every 5 (five) minutes as needed for chest pain. 05/13/16   Velvet Bathe, MD  Omega-3 Fatty Acids (FISH OIL) 1000 MG CAPS Take 1,000 mg by mouth 2 (two) times daily.     [provider]  oxyCODONE (OXY IR/ROXICODONE) 5 MG immediate release tablet Take 1 tablet (5 mg total) by mouth every 4 (four) hours as needed for moderate pain. 02/23/17   Florencia Reasons, MD  potassium chloride SA (K-DUR,KLOR-CON) 20 MEQ tablet take 2 tablets by mouth once daily Patient taking differently: take 1 tablets by mouth once daily 01/12/17   Medina-Vargas, Monina C, NP  senna-docusate (SENOKOT-S) 8.6-50 MG tablet Take 1 tablet by mouth at bedtime. 02/23/17   Florencia Reasons, MD  tamsulosin [facility]) 0.4 MG CAPS capsule take 1 capsule by mouth once daily 01/12/17   Medina-Vargas, Monina C, NP  thiamine (VITAMIN B-1) 100 MG tablet Take 100 mg by mouth daily.    [provider]    Physical Exam: Vitals:   11/09/17 1415 11/09/17 1445 11/09/17 1515 11/09/17 1530  BP: (!) 159/98 (!) 167/108 (!) 151/118 (!) 148/95  Pulse:  (!) 111 100 95  Resp: 17 18  18   Temp:      SpO2:  93% 93% 95%  Weight:      Height:          Constitutional: chronically ill appearing Vitals:   11/09/17 1415 11/09/17 1445 11/09/17 1515 11/09/17 1530  BP: (!) 159/98 (!) 167/108 (!) 151/118 (!) 148/95  Pulse:  (!) 111 100 95  Resp: 17 18  18   Temp:      SpO2:  93% 93% 95%  Weight:      Height:       Eyes: PERRL, lids and conjunctivae normal, small wound above left eye ENMT: Mucous membranes are moist. Posterior pharynx clear of any exudate or lesions Respiratory: clear to auscultation bilaterally, no wheezing, no crackles. Normal respiratory effort. No accessory muscle use.  Cardiovascular: tachy, no LE edema Abdomen: no tenderness, no masses palpated.  No hepatosplenomegaly. Bowel sounds positive.  Musculoskeletal: no clubbing / cyanosis. No joint deformity upper and lower extremities. Good ROM, no contractures. Normal muscle tone.  Skin: no rashes, lesions, ulcers. No induration Neurologic: CN 2-12 grossly intact. Sensation intact, hard of hearing Psychiatric:  Alert and oriented x 3. tremulose   Labs  on Admission: I have personally reviewed following labs and imaging studies  CBC: Recent Labs  Lab 11/09/17 1252  WBC 4.7  NEUTROABS 3.2  HGB 13.0  HCT 36.0*  MCV 99.7  PLT 258   Basic Metabolic Panel: Recent Labs  Lab 11/09/17 1252  NA 138  K 3.2*  CL 101  CO2 16*  GLUCOSE 110*  BUN 9  CREATININE 0.92  CALCIUM 8.8*  MG 1.1*   GFR: Estimated Creatinine Clearance: 85.9 mL/min (by C-G formula based on SCr of 0.92 mg/dL). Liver Function Tests: Recent Labs  Lab 11/09/17 1252  AST 90*  ALT 30  ALKPHOS 81  BILITOT 1.2  PROT 7.0  ALBUMIN 3.8   Recent Labs  Lab 11/09/17 1252  LIPASE 17   No results for input(s): AMMONIA in the last 168 hours. Coagulation Profile: No results for input(s): INR, PROTIME in the last 168 hours. Cardiac Enzymes: No results for input(s): CKTOTAL, CKMB, CKMBINDEX, TROPONINI in the last 168 hours. BNP (last 3 results) No results for input(s): PROBNP in the last 8760 hours. HbA1C: No results for input(s): HGBA1C in the last 72 hours. CBG: No results for input(s): GLUCAP in the last 168 hours. Lipid Profile: No results for input(s): CHOL, HDL, LDLCALC, TRIG, CHOLHDL, LDLDIRECT in the last 72 hours. Thyroid Function Tests: No results for input(s): TSH, T4TOTAL, FREET4, T3FREE, THYROIDAB in the last 72 hours. Anemia Panel: No results for input(s): VITAMINB12, FOLATE, FERRITIN, TIBC, IRON, RETICCTPCT in the last 72 hours. Urine analysis:    Component Value Date/Time   COLORURINE YELLOW 10/21/2016 [place] 10/21/2016 1835   LABSPEC 1.024 10/21/2016 1835   PHURINE  6.5 10/21/2016 1835   GLUCOSEU NEGATIVE 10/21/2016 1835   HGBUR NEGATIVE 10/21/2016 [place] NEGATIVE 10/21/2016 1835   KETONESUR NEGATIVE 10/21/2016 1835   PROTEINUR NEGATIVE 10/21/2016 1835   UROBILINOGEN 2.0 (H) 04/02/2015 2013   NITRITE NEGATIVE 10/21/2016 1835   LEUKOCYTESUR NEGATIVE 10/21/2016 1835   Sepsis Labs: Invalid input(s): PROCALCITONIN, LACTICIDVEN No results found for this or any previous visit (from the past 240 hour(s)).   Radiological Exams on Admission: Dg Chest 2 View  Result Date: 11/09/2017 CLINICAL DATA:  Pt arrives POV, states he is withdrawing from ETOH, last drink 2-3 days ago, states he is a heavy drinker. Generalized pain and tremors.PT IS HOH Pt smells of ETOH hx hypertension - hx non ST evaluated myocardial in farction - hx prostate cancer 2010 - current smoker EXAM: CHEST  2 VIEW COMPARISON:  09/20/2016. FINDINGS: Cardiac silhouette is normal in size. No mediastinal or hilar masses. No evidence of adenopathy. Clear lungs.  No pleural effusion or pneumothorax. Skeletal structures are intact. IMPRESSION: No active cardiopulmonary disease. Electronically Signed   By: Lajean Manes M.D.   On: 11/09/2017 15:31      Assessment/Plan Active Problems:   Delirium tremens ([place])   Essential hypertension   Alcohol withdrawal ([place])  Alcohol withdrawal -1/2 pint/day -showing signs of withdrawal despite alcohol level >100 -CIWA SDU -thiamine/MVI  Chronic back pain -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 -PRN pain meds  HTN -continue coreg  Hypokalemia/hypomagnesemia -replete -recheck in AM  Lactic acidosis -IVF -follow until normal   DVT prophylaxis: lovenox Code Status: full Family Communication:  Disposition Plan:  Consults called:  Admission status: SDU-- showing signs of withdrawal with alcohol level >100   [facility] Pager [phone]  If 7PM-7AM, please contact night-coverage [email or web address] Password  [facility]  11/09/2017, 4:02  PM

## 2017-11-09 NOTE — ED Notes (Signed)
Patient given urinal but unable to void in it.  Urine went all over bed.

## 2017-11-09 NOTE — ED Provider Notes (Signed)
[facility] DEPT Provider Note   CSN: [id number] Arrival date & time: 11/09/17  1214     History   Chief Complaint Chief Complaint  Patient presents with  . Alcohol Intoxication    HPI Joshua Henson is a 71 y.o. male.  Patient is a 71 year old male with a history of N STEMI, DTs, chronic alcohol abuse, cocaine abuse, coronary artery disease, pancreatitis, hepatic encephalopathy, sepsis presenting today with desire to stop drinking.  Patient states that he has been drinking half a pint of vodka daily for some period of time now.  He states that he was diagnosed with a cyst on his back that is not amenable to surgery.  It intermittently hurts some but over the last 2 weeks it is been hurting more and has not been able to get a hold of his medical doctors for more pain medication so he has been drinking more.  He states for the last 2 weeks he has had minimal food to eat and is usually just been drinking alcohol.  He has had some intermittent vomiting but denies any abdominal pain.  He states he last drank yesterday but is starting to feel shaky.  He denies any hallucinations but is complaining of tremors in his hands.  He is not currently taking any medications.  He denies any suicidal or homicidal thoughts   The history is provided by the patient.    Past Medical History:  Diagnosis Date  . Acute hepatic encephalopathy   . Alcoholic cirrhosis of liver without ascites ([place])   . Anxiety state   . CAD (coronary artery disease), native coronary artery 01/10/2016   3 vessel coronary calcification noted on CT scan 04/03/15    . Depression   . Fatty liver, alcoholic 6/38/7564  . Hypertension   . Increased ammonia level   . Insomnia   . NSTEMI (non-ST elevated myocardial infarction) ([place])   . Prostate cancer ([place]) 2010   External beam radiation (urol - Risa Grill, XRT Valere Dross)  . Stroke ([place])   . Tachycardia     Patient Active Problem List   Diagnosis Date  Noted  . Alcohol-induced acute pancreatitis   . Hematemesis 02/15/2017  . Acute pancreatitis 02/15/2017  . Alcohol dependence with unspecified alcohol-induced disorder ([place])   . Alcohol withdrawal delirium ([place]) 09/13/2016  . C. difficile colitis 03/30/2016  . Essential hypertension 03/23/2016  . Fall   . Tobacco abuse   . Hypokalemia   . CAD (coronary artery disease)   . Cocaine abuse ([place])   . Frequent falls 01/11/2016  . Left rib fracture 01/11/2016  . Acute encephalopathy 01/11/2016  . Polysubstance abuse ([place]) 01/11/2016  . Delirium tremens ([place]) 01/11/2016  . NSTEMI (non-ST elevated myocardial infarction) ([place]) 01/11/2016  . Sepsis due to Enterococcus with acute renal failure and metabolic encephalopathy ([place]) 01/11/2016  . Chronic alcohol abuse   . CAD (coronary artery disease), native coronary artery 01/10/2016  . Fatty liver, alcoholic 33/29/5188  . Ataxia   . Alcohol withdrawal, with delirium ([place]) 09/03/2014  . Unspecified cerebral artery occlusion with cerebral infarction 07/05/2014  . Alcohol abuse 07/02/2014  . Current tobacco use 01/17/2014  . Hearing loss 11/25/2013  . Tinnitus of both ears 11/25/2013    Past Surgical History:  Procedure Laterality Date  . CARDIAC CATHETERIZATION N/A 01/15/2016   Procedure: Left Heart Cath and Coronary Angiography;  Surgeon: Burnell Blanks, MD;  Location: [facility];  Service: Cardiovascular;  Laterality: N/A;  Home Medications    Prior to Admission medications   Medication Sig Start Date End Date Taking? Authorizing Provider  aspirin 81 MG EC tablet Take 1 tablet (81 mg total) by mouth daily. 07/11/14   Angiulli, Lavon Paganini, PA-C  atorvastatin (LIPITOR) 40 MG tablet Take 1 tablet (40 mg total) by mouth daily at 6 PM. 01/19/16   Allie Bossier, MD  buPROPion [facility] SR) 200 MG 12 hr tablet Take 200 mg by mouth daily.    [provider]  carvedilol (COREG) 12.5 MG tablet Take 1 tablet (12.5  mg total) by mouth 2 (two) times daily. 02/23/17   Florencia Reasons, MD  cholecalciferol (VITAMIN D) 1000 units tablet Take 1,000 Units by mouth 2 (two) times daily.     [provider]  Coenzyme Q10 (CO Q-10) 200 MG CAPS Take 200 mg by mouth daily.    [provider]  folic acid (FOLVITE) 1 MG tablet take 1 tablet by mouth once daily 01/12/17   Medina-Vargas, Monina C, NP  hydrOXYzine (ATARAX/VISTARIL) 10 MG tablet Take 1 tablet (10 mg total) by mouth 3 (three) times daily as needed for anxiety. 05/13/16   Velvet Bathe, MD  LORazepam (ATIVAN) 1 MG tablet Take 1 tablet (1 mg total) by mouth 2 (two) times daily as needed for anxiety. 02/23/17   Florencia Reasons, MD  Magnesium 500 MG CAPS Take 500 mg by mouth at bedtime.    [provider]  mirtazapine (REMERON) 7.5 MG tablet take 1 tablet by mouth at bedtime 01/12/17   Medina-Vargas, Monina C, NP  nitroGLYCERIN (NITROSTAT) 0.4 MG SL tablet Place 1 tablet (0.4 mg total) under the tongue every 5 (five) minutes as needed for chest pain. 05/13/16   Velvet Bathe, MD  Omega-3 Fatty Acids (FISH OIL) 1000 MG CAPS Take 1,000 mg by mouth 2 (two) times daily.     [provider]  oxyCODONE (OXY IR/ROXICODONE) 5 MG immediate release tablet Take 1 tablet (5 mg total) by mouth every 4 (four) hours as needed for moderate pain. 02/23/17   Florencia Reasons, MD  potassium chloride SA (K-DUR,KLOR-CON) 20 MEQ tablet take 2 tablets by mouth once daily Patient taking differently: take 1 tablets by mouth once daily 01/12/17   Medina-Vargas, Monina C, NP  senna-docusate (SENOKOT-S) 8.6-50 MG tablet Take 1 tablet by mouth at bedtime. 02/23/17   Florencia Reasons, MD  tamsulosin [facility]) 0.4 MG CAPS capsule take 1 capsule by mouth once daily 01/12/17   Medina-Vargas, Monina C, NP  thiamine (VITAMIN B-1) 100 MG tablet Take 100 mg by mouth daily.    [provider]    Family History Family History  Problem Relation Age of Onset  . Cancer Mother        esophageal  .  Hypertension Mother   . Diabetes Mother   . Cancer Father        prostate  . Heart disease Maternal Grandfather        MI  . Cancer Paternal Grandfather        prostate    Social History Social History   Tobacco Use  . Smoking status: Current Every Day Smoker    Packs/day: 0.50    Years: 30.00    Pack years: 15.00    Types: Cigarettes  . Smokeless tobacco: Never Used  Substance Use Topics  . Alcohol use: Yes    Alcohol/week: 33.6 oz    Types: 56 Shots of liquor per week    Comment:  last drink yesterday  . Drug use: No    Comment: Cocaine intermittently     Allergies   Lisinopril and Celebrex [celecoxib]   Review of Systems Review of Systems  All other systems reviewed and are negative.    Physical Exam Updated Vital Signs BP (!) 170/112 (BP Location: Left Arm)   Pulse (!) 145   Temp 98.3 F (36.8 C)   Resp (!) 24   Ht 5\' 10"  (1.778 m)   Wt 90.7 kg (200 lb)   SpO2 98%   BMI 28.70 kg/m   Physical Exam  Constitutional: He is oriented to person, place, and time. He appears well-developed and well-nourished. No distress.  HENT:  Head: Normocephalic and atraumatic.  Dry mucous membranes  Eyes: Conjunctivae and EOM are normal. Pupils are equal, round, and reactive to light.  Left subconjunctival hemorrhage  Neck: Normal range of motion. Neck supple.  Cardiovascular: Regular rhythm and intact distal pulses. Tachycardia present.  No murmur heard. Pulmonary/Chest: Effort normal and breath sounds normal. No respiratory distress. He has no wheezes. He has no rales.  Abdominal: Soft. He exhibits no distension. There is no tenderness. There is no rebound and no guarding.  Musculoskeletal: Normal range of motion. He exhibits no edema or tenderness.  Neurological: He is alert and oriented to person, place, and time.  Generalized fine tremor of bilateral upper extremities.  No notable tongue fasciculations  Skin: Skin is warm and dry. Capillary refill takes 2 to 3  seconds. No rash noted. No erythema.  Psychiatric:  No evidence of hallucination, HI or SI.  Patient admits to not sleeping  Nursing note and vitals reviewed.    ED Treatments / Results  Labs (all labs ordered are listed, but only abnormal results are displayed) Labs Reviewed  CBC WITH DIFFERENTIAL/PLATELET - Abnormal; Notable for the following components:      Result Value   RBC 3.61 (*)    HCT 36.0 (*)    MCH 36.0 (*)    MCHC 36.1 (*)    All other components within normal limits  COMPREHENSIVE METABOLIC PANEL - Abnormal; Notable for the following components:   Potassium 3.2 (*)    CO2 16 (*)    Glucose, Bld 110 (*)    Calcium 8.8 (*)    AST 90 (*)    Anion gap 21 (*)    All other components within normal limits  ETHANOL - Abnormal; Notable for the following components:   Alcohol, Ethyl (B) 118 (*)    All other components within normal limits  RAPID URINE DRUG SCREEN, HOSP PERFORMED - Abnormal; Notable for the following components:   Opiates POSITIVE (*)    Benzodiazepines POSITIVE (*)    All other components within normal limits  MAGNESIUM - Abnormal; Notable for the following components:   Magnesium 1.1 (*)    All other components within normal limits  BLOOD GAS, VENOUS - Abnormal; Notable for the following components:   pH, Ven 7.590 (*)    All other components within normal limits  I-STAT CG4 LACTIC ACID, ED - Abnormal; Notable for the following components:   Lactic Acid, Venous 8.04 (*)    All other components within normal limits  I-STAT CG4 LACTIC ACID, ED - Abnormal; Notable for the following components:   Lactic Acid, Venous 3.51 (*)    All other components within normal limits  LIPASE, BLOOD    EKG  EKG Interpretation  Date/Time:  Thursday November 09 2017 12:20:04 EST Ventricular Rate:  135 PR Interval:    QRS Duration: 117 QT Interval:  312 QTC Calculation: 461 R Axis:   107 Text Interpretation:  Sinus tachycardia Left posterior fascicular block  Anterior infarct, age indeterminate Artifact in lead(s) I II aVR and baseline wander in lead(s) II V3 V4 V5 No significant change since last tracing Confirmed by Blanchie Dessert [phone]) on 11/09/2017 12:57:07 PM       Radiology Dg Chest 2 View  Result Date: 11/09/2017 CLINICAL DATA:  Pt arrives POV, states he is withdrawing from ETOH, last drink 2-3 days ago, states he is a heavy drinker. Generalized pain and tremors.PT IS HOH Pt smells of ETOH hx hypertension - hx non ST evaluated myocardial in farction - hx prostate cancer 2010 - current smoker EXAM: CHEST  2 VIEW COMPARISON:  09/20/2016. FINDINGS: Cardiac silhouette is normal in size. No mediastinal or hilar masses. No evidence of adenopathy. Clear lungs.  No pleural effusion or pneumothorax. Skeletal structures are intact. IMPRESSION: No active cardiopulmonary disease. Electronically Signed   By: Lajean Manes M.D.   On: 11/09/2017 15:31    Procedures Procedures (including critical care time)  Medications Ordered in ED Medications  LORazepam (ATIVAN) injection 1 mg (not administered)  sodium chloride 0.9 % bolus 1,000 mL (1,000 mLs Intravenous New Bag/Given 11/09/17 1301)  LORazepam (ATIVAN) injection 0-4 mg (not administered)    Or  LORazepam (ATIVAN) tablet 0-4 mg (not administered)  LORazepam (ATIVAN) injection 0-4 mg (not administered)    Or  LORazepam (ATIVAN) tablet 0-4 mg (not administered)  thiamine (VITAMIN B-1) tablet 100 mg (not administered)    Or  thiamine (B-1) injection 100 mg (not administered)     Initial Impression / Assessment and Plan / ED Course  I have reviewed the triage vital signs and the nursing notes.  Pertinent labs & imaging results that were available during my care of the patient were reviewed by me and considered in my medical decision making (see chart for details).     Patient presenting today with symptoms concerning for alcohol withdrawal versus alcoholic ketosis and dehydration.  He has  no abdominal pain concerning for pancreatitis but is tachycardic and tachypneic.  He denies any chest pain or shortness of breath but does note that he is breathing faster.  No hallucinating but does have a fine tremor.  Seems as if patient last drank yesterday.  He does not seem acutely intoxicated at this time.  CBC, CMP, lipase, magnesium, lactate, chest x-ray, EKG pending.  Patient given IV fluids, thiamine and Ativan on the CIWA scale  1:51 PM Labs are consistent with a normal CBC, CMP with a CO2 of 16 and anion gap of 21, normal lipase and a lactic acid of 8.  Patient's magnesium is also 1.1.  EKG without evidence of acute ischemia.  Patient given 2 L of.  Patient's alcohol is 118 at this time.    3:47 PM After 2L repeat lactate is 3.5.  Pt CIWA intially 54 and treated with 2 of ativan and given thiamine and magnesium.  Will admit for further care  CRITICAL CARE Performed by: Princessa Lesmeister Total critical care time: 30 minutes Critical care time was exclusive of separately billable procedures and treating other patients. Critical care was necessary to treat or prevent imminent or life-threatening deterioration. Critical care was time spent personally by me on the following activities: development of treatment plan with patient and/or surrogate as well as nursing, discussions with consultants, evaluation of patient's response to treatment,  examination of patient, obtaining history from patient or surrogate, ordering and performing treatments and interventions, ordering and review of laboratory studies, ordering and review of radiographic studies, pulse oximetry and re-evaluation of patient's condition.   Final Clinical Impressions(s) / ED Diagnoses   Final diagnoses:  Alcohol withdrawal syndrome with complication ([place])  Alcoholic ketosis  Dehydration  Hypomagnesemia    ED Discharge Orders    None       Blanchie Dessert, MD 11/09/17 1548

## 2017-11-09 NOTE — ED Notes (Signed)
Admitting MD asked if patient could have his pain medication right away.

## 2017-11-09 NOTE — Progress Notes (Signed)
PHARMACIST - PHYSICIAN ORDER COMMUNICATION  CONCERNING: P&T Medication Policy on Herbal Medications  DESCRIPTION:  This patient's order for:  Co-Q 10  has been noted.  This product(s) is classified as an "herbal" or natural product. Due to a lack of definitive safety studies or FDA approval, nonstandard manufacturing practices, plus the potential risk of unknown drug-drug interactions while on inpatient medications, the Pharmacy and Therapeutics Committee does not permit the use of "herbal" or natural products of this type within [facility].   ACTION TAKEN: The pharmacy department is unable to verify this order at this time and your patient has been informed of this safety policy. Please reevaluate patient's clinical condition at discharge and address if the herbal or natural product(s) should be resumed at that time.  Hershal Coria, PharmD, BCPS Pager: [phone] 11/09/2017 8:56 PM

## 2017-11-10 ENCOUNTER — Inpatient Hospital Stay (HOSPITAL_COMMUNITY): Payer: Medicare Other

## 2017-11-10 DIAGNOSIS — F10231 Alcohol dependence with withdrawal delirium: Principal | ICD-10-CM

## 2017-11-10 DIAGNOSIS — I10 Essential (primary) hypertension: Secondary | ICD-10-CM

## 2017-11-10 DIAGNOSIS — F10239 Alcohol dependence with withdrawal, unspecified: Secondary | ICD-10-CM

## 2017-11-10 LAB — COMPREHENSIVE METABOLIC PANEL
ALT: 27 U/L (ref 17–63)
AST: 70 U/L — AB (ref 15–41)
Albumin: 3.7 g/dL (ref 3.5–5.0)
Alkaline Phosphatase: 77 U/L (ref 38–126)
Anion gap: 11 (ref 5–15)
BILIRUBIN TOTAL: 2 mg/dL — AB (ref 0.3–1.2)
BUN: 7 mg/dL (ref 6–20)
CHLORIDE: 98 mmol/L — AB (ref 101–111)
CO2: 26 mmol/L (ref 22–32)
CREATININE: 0.73 mg/dL (ref 0.61–1.24)
Calcium: 8.2 mg/dL — ABNORMAL LOW (ref 8.9–10.3)
Glucose, Bld: 99 mg/dL (ref 65–99)
POTASSIUM: 3.6 mmol/L (ref 3.5–5.1)
Sodium: 135 mmol/L (ref 135–145)
TOTAL PROTEIN: 6.8 g/dL (ref 6.5–8.1)

## 2017-11-10 LAB — CBC
HCT: 35.5 % — ABNORMAL LOW (ref 39.0–52.0)
Hemoglobin: 12.4 g/dL — ABNORMAL LOW (ref 13.0–17.0)
MCH: 35.3 pg — ABNORMAL HIGH (ref 26.0–34.0)
MCHC: 34.9 g/dL (ref 30.0–36.0)
MCV: 101.1 fL — ABNORMAL HIGH (ref 78.0–100.0)
Platelets: 128 K/uL — ABNORMAL LOW (ref 150–400)
RBC: 3.51 MIL/uL — ABNORMAL LOW (ref 4.22–5.81)
RDW: 14.3 % (ref 11.5–15.5)
WBC: 3.5 K/uL — ABNORMAL LOW (ref 4.0–10.5)

## 2017-11-10 LAB — MAGNESIUM: Magnesium: 1.6 mg/dL — ABNORMAL LOW (ref 1.7–2.4)

## 2017-11-10 MED ORDER — SODIUM CHLORIDE 0.9 % IV SOLN
INTRAVENOUS | Status: AC
  Administered 2017-11-10: 100 mL/h via INTRAVENOUS
  Administered 2017-11-11: 23:00:00 via INTRAVENOUS
  Administered 2017-11-11: 100 mL/h via INTRAVENOUS
  Administered 2017-11-12 – 2017-11-13 (×2): via INTRAVENOUS

## 2017-11-10 MED ORDER — THIAMINE HCL 100 MG/ML IJ SOLN
Freq: Once | INTRAVENOUS | Status: AC
  Administered 2017-11-10: 10:00:00 via INTRAVENOUS
  Filled 2017-11-10: qty 1000

## 2017-11-10 MED ORDER — HYDRALAZINE HCL 20 MG/ML IJ SOLN
10.0000 mg | Freq: Four times a day (QID) | INTRAMUSCULAR | Status: DC | PRN
Start: 2017-11-10 — End: 2017-11-11
  Administered 2017-11-11: 10 mg via INTRAVENOUS
  Filled 2017-11-10: qty 1

## 2017-11-10 MED ORDER — HALOPERIDOL LACTATE 5 MG/ML IJ SOLN
5.0000 mg | Freq: Once | INTRAMUSCULAR | Status: AC
  Administered 2017-11-10: 5 mg via INTRAVENOUS
  Filled 2017-11-10: qty 1

## 2017-11-10 MED ORDER — LORAZEPAM 2 MG/ML IJ SOLN
2.0000 mg | INTRAMUSCULAR | Status: DC | PRN
  Administered 2017-11-10: 3 mg via INTRAVENOUS
  Administered 2017-11-10 – 2017-11-11 (×9): 2 mg via INTRAVENOUS
  Filled 2017-11-10 (×5): qty 1
  Filled 2017-11-10: qty 2
  Filled 2017-11-10 (×4): qty 1

## 2017-11-10 MED ORDER — ASPIRIN 300 MG RE SUPP
150.0000 mg | Freq: Every day | RECTAL | Status: DC
  Administered 2017-11-10 – 2017-11-12 (×3): 150 mg via RECTAL
  Filled 2017-11-10 (×4): qty 1

## 2017-11-10 MED ORDER — METOPROLOL TARTRATE 5 MG/5ML IV SOLN
5.0000 mg | Freq: Three times a day (TID) | INTRAVENOUS | Status: DC
  Administered 2017-11-10 – 2017-11-12 (×6): 5 mg via INTRAVENOUS
  Filled 2017-11-10 (×5): qty 5

## 2017-11-10 NOTE — Care Management Note (Signed)
Case Management Note  Patient Details  Name: LAIN TETTERTON MRN: [id number] Date of Birth: 07/21/1946  Subjective/Objective:                  etoh withdrawal and ciwa protocol  Action/Plan: Date: November 10, 2017 [name], BSN, [place], [place] Chart and notes review for patient progress and needs. Will follow for case management and discharge needs. Next review date: [id number]  Expected Discharge Date:                  Expected Discharge Plan:  Home/Self Care  In-House Referral:     Discharge planning Services  CM Consult  Post Acute Care Choice:    Choice offered to:     DME Arranged:    DME Agency:     HH Arranged:    HH Agency:     Status of Service:  In process, will continue to follow  If discussed at Long Length of Stay Meetings, dates discussed:    Additional Comments:  Leeroy Cha, RN 11/10/2017, 8:35 AM

## 2017-11-10 NOTE — Progress Notes (Signed)
This RN wasted 3 mg of Morphine 4mg /ML with Venia Carbon, RN

## 2017-11-10 NOTE — Consult Note (Signed)
PULMONARY / CRITICAL CARE MEDICINE   Name: Joshua Henson MRN: [id number] DOB: [birth year]    ADMISSION DATE:  11/09/2017 CONSULTATION DATE: 11/23  REFERRING MD: Dr. Erlinda Hong  CHIEF COMPLAINT:  ETOH withdrawal   HISTORY OF PRESENT ILLNESS: 71 year old male who presented to [facility] long hospital on 11/22 with reports of alcohol withdrawal.  On admission the patient reported his last drink was 2-3 days prior to presentation.  He reported pain and tremors.  Initial alcohol level was 118.  UDS was positive for benzos and opiates.  Initial labs notable for hypokalemia, anion gap of 21, lactic acid 8.04, MCV 99.7.  CXR negative for acute process.  The patient was admitted by [facility] and placed on alcohol withdrawal protocol.  Due to patient's prior history of multiple admissions with need for Precedex, PCCM consulted for evaluation.  Chart review shows the patient has had 23 mg of Ativan since 1 PM on 11/22.    PAST MEDICAL HISTORY :  He  has a past medical history of Acute hepatic encephalopathy, Alcoholic cirrhosis of liver without ascites ([place]), Anxiety state, CAD (coronary artery disease), native coronary artery (01/10/2016), Depression, Fatty liver, alcoholic (01/12/5808), Hypertension, Increased ammonia level, Insomnia, NSTEMI (non-ST elevated myocardial infarction) [facility]), Prostate cancer ([place]) (2010), Stroke ([place]), and Tachycardia.  PAST SURGICAL HISTORY: He  has a past surgical history that includes Cardiac catheterization (N/A, 01/15/2016).  Allergies  Allergen Reactions  . Lisinopril     syncope  . Celebrex [Celecoxib] Rash    No current facility-administered medications on file prior to encounter.    Current Outpatient Medications on File Prior to Encounter  Medication Sig  . aspirin 81 MG EC tablet Take 1 tablet (81 mg total) by mouth daily.  Marland Kitchen atorvastatin (LIPITOR) 40 MG tablet Take 1 tablet (40 mg total) by mouth daily at 6 PM.  . buPROPion (WELLBUTRIN SR) 200 MG 12 hr tablet Take 200  mg by mouth daily.  . carvedilol (COREG) 12.5 MG tablet Take 1 tablet (12.5 mg total) by mouth 2 (two) times daily.  . cholecalciferol (VITAMIN D) 1000 units tablet Take 1,000 Units by mouth 2 (two) times daily.   . Coenzyme Q10 (CO Q-10) 200 MG CAPS Take 200 mg by mouth daily.  . folic acid (FOLVITE) 1 MG tablet take 1 tablet by mouth once daily  . LORazepam (ATIVAN) 1 MG tablet Take 1 tablet (1 mg total) by mouth 2 (two) times daily as needed for anxiety.  . Magnesium 500 MG CAPS Take 500 mg by mouth at bedtime.  . mirtazapine (REMERON) 7.5 MG tablet take 1 tablet by mouth at bedtime (Patient taking differently: take 15mg  by mouth at bedtime)  . nitroGLYCERIN (NITROSTAT) 0.4 MG SL tablet Place 1 tablet (0.4 mg total) under the tongue every 5 (five) minutes as needed for chest pain.  . Omega-3 Fatty Acids (FISH OIL) 1000 MG CAPS Take 1,000 mg by mouth 2 (two) times daily.   Marland Kitchen oxyCODONE (OXY IR/ROXICODONE) 5 MG immediate release tablet Take 1 tablet (5 mg total) by mouth every 4 (four) hours as needed for moderate pain.  . potassium chloride SA (K-DUR,KLOR-CON) 20 MEQ tablet take 2 tablets by mouth once daily (Patient taking differently: take 1 tablets by mouth once daily)  . tamsulosin (FLOMAX) 0.4 MG CAPS capsule take 1 capsule by mouth once daily  . thiamine (VITAMIN B-1) 100 MG tablet Take 100 mg by mouth daily.  Marland Kitchen triamterene-hydrochlorothiazide (DYAZIDE) 37.5-25 MG capsule Take 1 capsule by mouth daily.  Marland Kitchen  hydrOXYzine (ATARAX/VISTARIL) 10 MG tablet Take 1 tablet (10 mg total) by mouth 3 (three) times daily as needed for anxiety. (Patient not taking: Reported on 11/09/2017)  . senna-docusate (SENOKOT-S) 8.6-50 MG tablet Take 1 tablet by mouth at bedtime. (Patient not taking: Reported on 11/09/2017)    FAMILY HISTORY:  His indicated that his mother is deceased. He indicated that his father is deceased. He indicated that his sister is alive. He indicated that the status of his maternal  grandfather is unknown. He indicated that the status of his paternal grandfather is unknown.   SOCIAL HISTORY: He  reports that he has been smoking cigarettes.  He has a 15.00 pack-year smoking history. he has never used smokeless tobacco. He reports that he drinks about 33.6 oz of alcohol per week. He reports that he does not use drugs.  REVIEW OF SYSTEMS: Able to complete as patient is altered due to alcohol withdrawal  SUBJECTIVE:   VITAL SIGNS: BP (!) 150/104 (BP Location: Left Arm)   Pulse 83   Temp 98.9 F (37.2 C) (Axillary)   Resp (!) 26   Ht 5\' 10"  (1.778 m)   Wt 206 lb 12.7 oz (93.8 kg)   SpO2 94%   BMI 29.67 kg/m   HEMODYNAMICS:    VENTILATOR SETTINGS:    INTAKE / OUTPUT: I/O last 3 completed shifts: In: -  Out: 2400 [Urine:2400]  PHYSICAL EXAMINATION: General: chronically ill appearing male in NAD, lying in bed HEENT: MM pink/dry, no jvd, bruising to L forehead PSY: calm Neuro: opens eyes to voice, oriented to self, city & year, drifts back to sleep after minimal interaction CV: s1s2 rrr, no m/r/g PULM: even/non-labored, lungs bilaterally diminished but clear  YQ:MVHQ, non-tender, bsx4 active  Extremities: warm/dry, no edema, scattered bruises of varying stages Skin: no rashes or lesions   LABS:  BMET Recent Labs  Lab 11/09/17 1252 11/10/17 0247  NA 138 135  K 3.2* 3.6  CL 101 98*  CO2 16* 26  BUN 9 7  CREATININE 0.92 0.73  GLUCOSE 110* 99    Electrolytes Recent Labs  Lab 11/09/17 1252 11/10/17 0247  CALCIUM 8.8* 8.2*  MG 1.1* 1.6*    CBC Recent Labs  Lab 11/09/17 1252 11/10/17 0247  WBC 4.7 3.5*  HGB 13.0 12.4*  HCT 36.0* 35.5*  PLT 154 128*    Coag's No results for input(s): APTT, INR in the last 168 hours.  Sepsis Markers Recent Labs  Lab 11/09/17 1539 11/09/17 1735 11/09/17 2003  LATICACIDVEN 3.51* 2.5* 1.2    ABG No results for input(s): PHART, PCO2ART, PO2ART in the last 168 hours.  Liver Enzymes Recent  Labs  Lab 11/09/17 1252 11/10/17 0247  AST 90* 70*  ALT 30 27  ALKPHOS 81 77  BILITOT 1.2 2.0*  ALBUMIN 3.8 3.7    Cardiac Enzymes No results for input(s): TROPONINI, PROBNP in the last 168 hours.  Glucose No results for input(s): GLUCAP in the last 168 hours.  Imaging Dg Chest 2 View  Result Date: 11/09/2017 CLINICAL DATA:  Pt arrives POV, states he is withdrawing from ETOH, last drink 2-3 days ago, states he is a heavy drinker. Generalized pain and tremors.PT IS HOH Pt smells of ETOH hx hypertension - hx non ST evaluated myocardial in farction - hx prostate cancer 2010 - current smoker EXAM: CHEST  2 VIEW COMPARISON:  09/20/2016. FINDINGS: Cardiac silhouette is normal in size. No mediastinal or hilar masses. No evidence of adenopathy. Clear lungs.  No  pleural effusion or pneumothorax. Skeletal structures are intact. IMPRESSION: No active cardiopulmonary disease. Electronically Signed   By: Lajean Manes M.D.   On: 11/09/2017 15:31     STUDIES:  RUQ [place] 11/23 >>   CULTURES:   ANTIBIOTICS:   SIGNIFICANT EVENTS: 11/22  Admit with ETOH withdrawal, level 118 on presentation    LINES/TUBES:   DISCUSSION: 71 y/o M with prior hx of ETOH withdrawal admitted on 11/22 with recurrent withdrawal symptoms.  Reportedly drinks a half of a fifth per day.  Chronic benzo/opiate use.    ASSESSMENT / PLAN:  NEUROLOGIC A:   Acute alcohol withdrawal Agitated delirium P:   RASS goal: 0 CIWA protocol Increase ativan  Monitor for worsening withdrawal symptoms  Thiamine, folate  Haldol PRN  PRN oxycodone for pain   PULMONARY A: P:     CARDIOVASCULAR A:  Sinus Rhythm  Hypertension  P:  SDU monitoring Lopressor 5mg  IV Q8 PRN hydralazine for SBP > 180  RENAL A:   Hypokalemia Lactic Acidosis - on admit, suspect from withdrawal symptoms, cleared  P:   Trend BMP / urinary output Assess CK Replace electrolytes as indicated Avoid nephrotoxic agents, ensure adequate renal  perfusion  GASTROINTESTINAL A:   At risk of aspiration Mild Elevation of LFT's P:   NPO unless fully alert RUQ [place] per primary  Aspiration precautions PRN zofran   HEMATOLOGIC A:   P:  Lovenox for DVT prophylaxis   INFECTIOUS A:   P:    ENDOCRINE A:   At risk for hyper/hypoglycemia P:   Monitor glucose on BMP   FAMILY  - Updates: No family at bedside.      Noe Gens, NP-C [facility] Pgr: [phone] or if no answer [phone] 11/10/2017, 10:47 AM

## 2017-11-10 NOTE — Progress Notes (Signed)
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  Alcohol withdrawal -1/2 pint/day, alcohol level 118 on presentation -CIWA SDU -thiamine/MVI -aspiration protocol -critical care consulted, as patient has h/o prolonged severe alchol withdrawal requiring precedex and prolonged hospital stay ( per wife his symptom will peak around day 4-5 in the past) -aspiration and seizure precaution   HTN -not consistently taking oral meds -change to iv lopressor with parameters and prn hydralazine  Hypokalemia/hypomagnesemia -replete -recheck in AM  Lactic acidosis -lactic acid 8.04 -normalized with IVF  Alcohol cirrhosis:  H/o hepatic encephalopathy Has mildly elevated ast, ammonia level pending Live [place] /hepatitis panel ordered  Chronic back pain -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 -PRN pain meds  Hearing impairment: wearing hearing aids   DVT prophylaxis: lovenox Code Status: full  Family Communication: wife Joshua Henson who is currently In [place] over the phone  she will be back Teusday Patient's exwife and children may visit per Joshua Henson  Disposition Plan: remain in stepdown   Consultants:  Critical care  Procedures:  none  Antibiotics:  none   Objective: BP (!) 150/104 (BP Location: Left Arm)   Pulse 83   Temp 98.9 F (37.2 C) (Axillary)   Resp (!) 26   Ht 5\' 10"  (1.778 m)   Wt 93.8 kg (206 lb 12.7 oz)   SpO2 94%   BMI 29.67 kg/m   Intake/Output Summary (Last 24 hours) at 11/10/2017 0948 Last data filed at 11/10/2017 0600 Gross per 24 hour  Intake -  Output 2400 ml  Net -2400 ml   Filed Weights   11/09/17 1221 11/09/17 1630  Weight: 90.7 kg (200 lb)  93.8 kg (206 lb 12.7 oz)    Exam: Patient is examined daily including today on 11/10/2017, exams remain the same as of yesterday except that has changed    General:  Confused, agitated, hearing impairment  Cardiovascular: RRR  Respiratory: CTABL  Abdomen: Soft/ND/NT, positive BS  Musculoskeletal: No Edema  Neuro: confused and agitates  Data Reviewed: Basic Metabolic Panel: Recent Labs  Lab 11/09/17 1252 11/10/17 0247  NA 138 135  K 3.2* 3.6  CL 101 98*  CO2 16* 26  GLUCOSE 110* 99  BUN 9 7  CREATININE 0.92 0.73  CALCIUM 8.8* 8.2*  MG 1.1* 1.6*   Liver Function Tests: Recent Labs  Lab 11/09/17 1252 11/10/17 0247  AST 90* 70*  ALT 30 27  ALKPHOS 81 77  BILITOT 1.2 2.0*  PROT 7.0 6.8  ALBUMIN 3.8 3.7   Recent Labs  Lab 11/09/17 1252  LIPASE 17   No results for input(s): AMMONIA in the last 168 hours. CBC: Recent Labs  Lab 11/09/17 1252 11/10/17 0247  WBC 4.7 3.5*  NEUTROABS 3.2  --   HGB 13.0 12.4*  HCT 36.0* 35.5*  MCV 99.7 101.1*  PLT 154 128*   Cardiac Enzymes:   No results for input(s): CKTOTAL, CKMB, CKMBINDEX, TROPONINI in the last 168 hours. BNP (last 3 results) No results for input(s): BNP in the last 8760 hours.  ProBNP (last 3 results) No results for input(s): PROBNP in the last 8760 hours.  CBG: No results for input(s): GLUCAP in the last 168 hours.  No results found for this or any previous  visit (from the past 240 hour(s)).   Studies: Dg Chest 2 View  Result Date: 11/09/2017 CLINICAL DATA:  Pt arrives POV, states he is withdrawing from ETOH, last drink 2-3 days ago, states he is a heavy drinker. Generalized pain and tremors.PT IS HOH Pt smells of ETOH hx hypertension - hx non ST evaluated myocardial in farction - hx prostate cancer 2010 - current smoker EXAM: CHEST  2 VIEW COMPARISON:  09/20/2016. FINDINGS: Cardiac silhouette is normal in size. No mediastinal or hilar masses. No evidence of adenopathy. Clear lungs.  No  pleural effusion or pneumothorax. Skeletal structures are intact. IMPRESSION: No active cardiopulmonary disease. Electronically Signed   By: Lajean Manes M.D.   On: 11/09/2017 15:31    Scheduled Meds: . aspirin EC  81 mg Oral Daily  . carvedilol  12.5 mg Oral BID  . cholecalciferol  1,000 Units Oral BID  . enoxaparin (LOVENOX) injection  40 mg Subcutaneous Q24H  . folic acid  1 mg Oral Daily  . mirtazapine  7.5 mg Oral QHS  . multivitamin with minerals  1 tablet Oral Daily  . potassium chloride  40 mEq Oral Once  . tamsulosin  0.4 mg Oral Daily  . thiamine  100 mg Oral Daily    Continuous Infusions: . banana bag IV 1000 mL       Time spent: 29mins, case discussed with critical care. I have personally reviewed and interpreted on  11/10/2017 daily labs, tele strips, imagings as discussed above under date review session and assessment and plans.  I reviewed all nursing notes, pharmacy notes, vitals, pertinent old records  I have discussed plan of care as described above with RN , patient and family on 11/10/2017   Joshua Reasons MD, PhD  [facility] Pager [phone]. If 7PM-7AM, please contact night-coverage at [email or web address], password [facility] 11/10/2017, 9:48 AM  LOS: 1 day

## 2017-11-11 ENCOUNTER — Inpatient Hospital Stay (HOSPITAL_COMMUNITY): Payer: Medicare Other

## 2017-11-11 DIAGNOSIS — E8889 Other specified metabolic disorders: Secondary | ICD-10-CM

## 2017-11-11 DIAGNOSIS — E860 Dehydration: Secondary | ICD-10-CM

## 2017-11-11 DIAGNOSIS — E872 Acidosis: Secondary | ICD-10-CM

## 2017-11-11 LAB — BLOOD GAS, ARTERIAL
Acid-base deficit: 2.5 mmol/L — ABNORMAL HIGH (ref 0.0–2.0)
Acid-base deficit: 3.2 mmol/L — ABNORMAL HIGH (ref 0.0–2.0)
BICARBONATE: 20.7 mmol/L (ref 20.0–28.0)
Bicarbonate: 17.9 mmol/L — ABNORMAL LOW (ref 20.0–28.0)
DRAWN BY: 103701
Drawn by: 103701
FIO2: 100
MECHVT: 600 mL
O2 Content: 3 L/min
O2 Saturation: 93.3 %
O2 Saturation: 99.7 %
PATIENT TEMPERATURE: 99.4
PCO2 ART: 22.4 mmHg — AB (ref 32.0–48.0)
PEEP: 5 cmH2O
PO2 ART: 71.6 mmHg — AB (ref 83.0–108.0)
PO2 ART: 315 mmHg — AB (ref 83.0–108.0)
Patient temperature: 101.2
RATE: 15 resp/min
pCO2 arterial: 35.6 mmHg (ref 32.0–48.0)
pH, Arterial: 7.521 — ABNORMAL HIGH (ref 7.350–7.450)
pH, Arterial: 7.385 (ref 7.350–7.450)

## 2017-11-11 LAB — COMPREHENSIVE METABOLIC PANEL
ALBUMIN: 3.7 g/dL (ref 3.5–5.0)
ALT: 24 U/L (ref 17–63)
AST: 63 U/L — AB (ref 15–41)
Alkaline Phosphatase: 83 U/L (ref 38–126)
Anion gap: 16 — ABNORMAL HIGH (ref 5–15)
BILIRUBIN TOTAL: 1.9 mg/dL — AB (ref 0.3–1.2)
BUN: 9 mg/dL (ref 6–20)
CHLORIDE: 100 mmol/L — AB (ref 101–111)
CO2: 19 mmol/L — ABNORMAL LOW (ref 22–32)
Calcium: 8.1 mg/dL — ABNORMAL LOW (ref 8.9–10.3)
Creatinine, Ser: 0.79 mg/dL (ref 0.61–1.24)
GFR calc Af Amer: >60 mL/min (ref >60)
GFR calc non Af Amer: >60 mL/min (ref >60)
GLUCOSE: 114 mg/dL — AB (ref 65–99)
POTASSIUM: 3 mmol/L — AB (ref 3.5–5.1)
Sodium: 135 mmol/L (ref 135–145)
TOTAL PROTEIN: 6.9 g/dL (ref 6.5–8.1)

## 2017-11-11 LAB — CBC WITH DIFFERENTIAL/PLATELET
Basophils Absolute: 0 10*3/uL (ref 0.0–0.1)
Basophils Relative: 0 %
Eosinophils Absolute: 0 10*3/uL (ref 0.0–0.7)
Eosinophils Relative: 0 %
HEMATOCRIT: 38.1 % — AB (ref 39.0–52.0)
HEMOGLOBIN: 13.3 g/dL (ref 13.0–17.0)
LYMPHS ABS: 0.9 10*3/uL (ref 0.7–4.0)
LYMPHS PCT: 12 %
MCH: 35.4 pg — AB (ref 26.0–34.0)
MCHC: 34.9 g/dL (ref 30.0–36.0)
MCV: 101.3 fL — AB (ref 78.0–100.0)
Monocytes Absolute: 0.8 10*3/uL (ref 0.1–1.0)
Monocytes Relative: 11 %
NEUTROS PCT: 77 %
Neutro Abs: 5.6 10*3/uL (ref 1.7–7.7)
Platelets: 151 10*3/uL (ref 150–400)
RBC: 3.76 MIL/uL — AB (ref 4.22–5.81)
RDW: 14.3 % (ref 11.5–15.5)
WBC: 7.3 10*3/uL (ref 4.0–10.5)

## 2017-11-11 LAB — AMMONIA: Ammonia: 44 umol/L — ABNORMAL HIGH (ref 9–35)

## 2017-11-11 LAB — TRIGLYCERIDES: TRIGLYCERIDES: 86 mg/dL (ref <150)

## 2017-11-11 LAB — CK: Total CK: 157 U/L (ref 49–397)

## 2017-11-11 LAB — MAGNESIUM: Magnesium: 1.5 mg/dL — ABNORMAL LOW (ref 1.7–2.4)

## 2017-11-11 MED ORDER — PROPOFOL 1000 MG/100ML IV EMUL
INTRAVENOUS | Status: AC
  Filled 2017-11-11: qty 100

## 2017-11-11 MED ORDER — POTASSIUM CHLORIDE 10 MEQ/100ML IV SOLN
10.0000 meq | INTRAVENOUS | Status: AC
  Administered 2017-11-11 (×4): 10 meq via INTRAVENOUS
  Filled 2017-11-11 (×4): qty 100

## 2017-11-11 MED ORDER — CHLORHEXIDINE GLUCONATE 0.12% ORAL RINSE (MEDLINE KIT)
15.0000 mL | Freq: Two times a day (BID) | OROMUCOSAL | Status: DC
  Administered 2017-11-11 – 2017-11-14 (×7): 15 mL via OROMUCOSAL

## 2017-11-11 MED ORDER — DEXMEDETOMIDINE HCL IN NACL 200 MCG/50ML IV SOLN
0.2000 ug/kg/h | INTRAVENOUS | Status: DC
  Administered 2017-11-11: 0.5 ug/kg/h via INTRAVENOUS
  Filled 2017-11-11 (×2): qty 50

## 2017-11-11 MED ORDER — MAGNESIUM SULFATE 2 GM/50ML IV SOLN
2.0000 g | Freq: Once | INTRAVENOUS | Status: AC
  Administered 2017-11-11: 2 g via INTRAVENOUS
  Filled 2017-11-11: qty 50

## 2017-11-11 MED ORDER — CLONIDINE HCL 0.1 MG/24HR TD PTWK
0.1000 mg | MEDICATED_PATCH | TRANSDERMAL | Status: DC
  Administered 2017-11-11: 0.1 mg via TRANSDERMAL
  Filled 2017-11-11: qty 1

## 2017-11-11 MED ORDER — PROPOFOL 1000 MG/100ML IV EMUL
5.0000 ug/kg/min | INTRAVENOUS | Status: DC
  Administered 2017-11-11 (×2): 30 ug/kg/min via INTRAVENOUS
  Administered 2017-11-12 (×3): 40 ug/kg/min via INTRAVENOUS
  Administered 2017-11-12: 35 ug/kg/min via INTRAVENOUS
  Administered 2017-11-12: 50 ug/kg/min via INTRAVENOUS
  Administered 2017-11-13: 15 ug/kg/min via INTRAVENOUS
  Administered 2017-11-13: 40 ug/kg/min via INTRAVENOUS
  Administered 2017-11-13: 25 ug/kg/min via INTRAVENOUS
  Administered 2017-11-13: 40 ug/kg/min via INTRAVENOUS
  Administered 2017-11-14: 30 ug/kg/min via INTRAVENOUS
  Filled 2017-11-11 (×12): qty 100

## 2017-11-11 MED ORDER — FENTANYL CITRATE (PF) 100 MCG/2ML IJ SOLN
25.0000 ug | INTRAMUSCULAR | Status: DC | PRN
  Administered 2017-11-13: 25 ug via INTRAVENOUS
  Filled 2017-11-11 (×2): qty 2

## 2017-11-11 MED ORDER — FAMOTIDINE IN NACL 20-0.9 MG/50ML-% IV SOLN
20.0000 mg | Freq: Two times a day (BID) | INTRAVENOUS | Status: DC
Start: 2017-11-11 — End: 2017-11-12
  Administered 2017-11-11: 20 mg via INTRAVENOUS
  Filled 2017-11-11 (×2): qty 50

## 2017-11-11 MED ORDER — ORAL CARE MOUTH RINSE
15.0000 mL | Freq: Four times a day (QID) | OROMUCOSAL | Status: DC
  Administered 2017-11-12 – 2017-11-15 (×14): 15 mL via OROMUCOSAL

## 2017-11-11 MED ORDER — LACTULOSE 10 GM/15ML PO SOLN
10.0000 g | Freq: Two times a day (BID) | ORAL | Status: DC
  Administered 2017-11-11: 10 g via ORAL
  Filled 2017-11-11: qty 15

## 2017-11-11 MED ORDER — AMPICILLIN-SULBACTAM SODIUM 1.5 (1-0.5) G IJ SOLR
1.5000 g | Freq: Four times a day (QID) | INTRAMUSCULAR | Status: DC
  Administered 2017-11-11 – 2017-11-12 (×5): 1.5 g via INTRAVENOUS
  Filled 2017-11-11 (×6): qty 1.5

## 2017-11-11 MED ORDER — MIDAZOLAM HCL 2 MG/2ML IJ SOLN
1.0000 mg | INTRAMUSCULAR | Status: DC | PRN
  Administered 2017-11-12 – 2017-11-13 (×3): 2 mg via INTRAVENOUS
  Administered 2017-11-13: 1 mg via INTRAVENOUS
  Filled 2017-11-11 (×5): qty 2

## 2017-11-11 NOTE — Consult Note (Addendum)
PULMONARY / CRITICAL CARE MEDICINE   Name: Joshua Henson MRN: [id number] DOB: 09/04/1946    ADMISSION DATE:  11/09/2017 CONSULTATION DATE: 11/23  REFERRING MD: Dr. Erlinda Hong  CHIEF COMPLAINT:  ETOH withdrawal   HISTORY OF PRESENT ILLNESS: 71 year old male who presented to [facility] hospital on 11/22 with reports of alcohol withdrawal.  On admission the patient reported his last drink was 2-3 days prior to presentation.  He reported pain and tremors.  Initial alcohol level was 118.  UDS was positive for benzos and opiates.  Initial labs notable for hypokalemia, anion gap of 21, lactic acid 8.04, MCV 99.7.  CXR negative for acute process.  The patient was admitted by [facility] and placed on alcohol withdrawal protocol.  Due to patient's prior history of multiple admissions with need for Precedex, PCCM consulted for evaluation.  Chart review shows the patient has had 23 mg of Ativan since 1 PM on 11/22.     SUBJECTIVE: some agitation Increasing RR  VITAL SIGNS: BP (!) 166/113   Pulse 83   Temp (!) 101.2 F (38.4 C) (Oral) Comment: reported to rn   Resp (!) 49   Ht 5\' 10"  (1.778 m)   Wt 93.8 kg (206 lb 12.7 oz)   SpO2 92%   BMI 29.67 kg/m   HEMODYNAMICS:    VENTILATOR SETTINGS:    INTAKE / OUTPUT: I/O last 3 completed shifts: In: 3375 [I.V.:3375] Out: 4590 [Urine:4590]  PHYSICAL EXAMINATION: General: awake, confused, tremors Neuro: moves al etx, delirium, perrl HEENT: jvd down PULM: ronchi bases, eleavted rr 40 CV:  s1 s2 RRR not tachy now GI: soft, liver border, no r, no pain Extremities:  No rash, no edema   LABS:  BMET Recent Labs  Lab 11/09/17 1252 11/10/17 0247 11/11/17 0257  NA 138 135 135  K 3.2* 3.6 3.0*  CL 101 98* 100*  CO2 16* 26 19*  BUN 9 7 9   CREATININE 0.92 0.73 0.79  GLUCOSE 110* 99 114*    Electrolytes Recent Labs  Lab 11/09/17 1252 11/10/17 0247 11/11/17 0257  CALCIUM 8.8* 8.2* 8.1*  MG 1.1* 1.6* 1.5*    CBC Recent Labs  Lab  11/09/17 1252 11/10/17 0247 11/11/17 0724  WBC 4.7 3.5* 7.3  HGB 13.0 12.4* 13.3  HCT 36.0* 35.5* 38.1*  PLT 154 128* 151    Coag's No results for input(s): APTT, INR in the last 168 hours.  Sepsis Markers Recent Labs  Lab 11/09/17 1539 11/09/17 1735 11/09/17 2003  LATICACIDVEN 3.51* 2.5* 1.2    ABG No results for input(s): PHART, PCO2ART, PO2ART in the last 168 hours.  Liver Enzymes Recent Labs  Lab 11/09/17 1252 11/10/17 0247 11/11/17 0257  AST 90* 70* 63*  ALT 30 27 24   ALKPHOS 81 77 83  BILITOT 1.2 2.0* 1.9*  ALBUMIN 3.8 3.7 3.7    Cardiac Enzymes No results for input(s): TROPONINI, PROBNP in the last 168 hours.  Glucose No results for input(s): GLUCAP in the last 168 hours.  Imaging US Abdomen Limited Ruq  Result Date: 11/10/2017 CLINICAL DATA:  Elevated LFTs. EXAM: ULTRASOUND ABDOMEN LIMITED RIGHT UPPER QUADRANT COMPARISON:  CT 02/21/2017 . FINDINGS: Gallbladder: No gallstones or wall thickening visualized. No sonographic Murphy sign noted by sonographer. Common bile duct: Diameter: 3.1 mm Liver: Increased echogenicity consistent with fatty infiltration and/or hepatocellular disease. No focal hepatic abnormality identified. Portal vein is patent on color Doppler imaging with normal direction of blood flow towards the liver. IMPRESSION: 1. Increase echogenicity consistent  fatty infiltration and/or hepatocellular disease. 2. No gallstones or biliary distention. Electronically Signed   By: Marcello Moores  Register   On: 11/10/2017 14:40     STUDIES:  RUQ [place] 11/23 >>   CULTURES:   ANTIBIOTICS:   SIGNIFICANT EVENTS: 11/22  Admit with ETOH withdrawal, level 118 on presentation    LINES/TUBES:   DISCUSSION: 71 y/o M with prior hx of ETOH withdrawal admitted on 11/22 with recurrent withdrawal symptoms.  Reportedly drinks a half of a fifth per day.  Chronic benzo/opiate use.    ASSESSMENT / PLAN:  NEUROLOGIC A:   Acute alcohol withdrawal Agitated  delirium P:   RASS goal: 0 CIWA protocol- NOT to be used in icu - dc Dc ativan  Add precedex, add versed prn, add prn fent Monitor for worsening withdrawal symptoms  - he has them now, see above Thiamine, folate  Haldol PRN - dc PRN oxycodone for pain   PULMONARY A: Concern at risk aspiration, elevated rr from WD P:   STAT pcxr Stat abg  CARDIOVASCULAR A:  Sinus Rhythm  Hypertension  P:  SDU monitoring Consider clonidine addition Lopressor 5mg  IV Q8, use PRN hydralazine for SBP > 180, dc If pcxr edema, will need echo to rule out etoh cardiomyopthy  RENAL A:   Hypokalemia Lactic Acidosis - on admit, suspect from withdrawal symptoms, cleared  P:   supp k , mag in setting etoh wd Pos balance Hydration Chem in am   GASTROINTESTINAL A:   At risk of aspiration Mild Elevation of LFT's Fatty liver P:   NPO strict RUQ [place] reviewed, assess AFP in am  PRN zofran   HEMATOLOGIC A:  DVT prevention P:  Lovenox for DVT prophylaxis   INFECTIOUS A:  Concern aspiration P:   pcxr Low threshold unasyn  ENDOCRINE A:   At risk for hyper/hypoglycemia P:   Monitor glucose on BMP They are wnl  FAMILY  - Updates: No family at bedside.     Ccm time 30 min   Lavon Paganini. Titus Mould, MD, [place] Pgr: [place] [facility]

## 2017-11-11 NOTE — Plan of Care (Signed)
  Progressing Pain Managment: General experience of comfort will improve 11/11/2017 0759 - Progressing by Hermelinda Dellen, RN Safety: Ability to remain free from injury will improve 11/11/2017 0759 - Progressing by Hermelinda Dellen, RN

## 2017-11-11 NOTE — Procedures (Signed)
Intubation Procedure Note KOHEI ANTONELLIS [id number] 10/11/1946  Procedure: Intubation Indications: Respiratory insufficiency  Procedure Details Consent: Unable to obtain consent because of emergent medical necessity. Time Out: Verified patient identification, verified procedure, site/side was marked, verified correct patient position, special equipment/implants available, medications/allergies/relevent history reviewed, required imaging and test results available.  Performed  Maximum sterile technique was used including antiseptics, cap, gloves, hand hygiene and mask.  MAC and 3    Evaluation Hemodynamic Status: BP stable throughout; O2 sats: stable throughout Patient's Current Condition: stable Complications: No apparent complications Patient did tolerate procedure well. Chest X-ray ordered to verify placement.  CXR: pending.   Raylene Miyamoto. 11/11/2017  massive pus all over airway suctioned

## 2017-11-11 NOTE — Consult Note (Deleted)
Not on precedex RN reports improvement Call if needed  Lavon Paganini. Titus Mould, MD, [place] Pgr: [place] [facility]

## 2017-11-11 NOTE — Progress Notes (Signed)
[place] Progress Note Patient Name: ELZIA HOTT DOB: 02-25-1946 MRN: [id number]   Date of Service  11/11/2017  HPI/Events of Note    eICU Interventions  pepcid added for SUP      Intervention Category Intermediate Interventions: Best-practice therapies (e.g. DVT, beta blocker, etc.)  Collene Gobble 11/11/2017, 9:49 PM

## 2017-11-11 NOTE — Progress Notes (Signed)
Alcohol withdrawal symptom worsening, critical care plan to intubate now , hospitalist will sign off.

## 2017-11-12 ENCOUNTER — Inpatient Hospital Stay (HOSPITAL_COMMUNITY): Payer: Medicare Other

## 2017-11-12 DIAGNOSIS — R7989 Other specified abnormal findings of blood chemistry: Secondary | ICD-10-CM

## 2017-11-12 DIAGNOSIS — J9600 Acute respiratory failure, unspecified whether with hypoxia or hypercapnia: Secondary | ICD-10-CM

## 2017-11-12 DIAGNOSIS — R945 Abnormal results of liver function studies: Secondary | ICD-10-CM

## 2017-11-12 LAB — BASIC METABOLIC PANEL
Anion gap: 11 (ref 5–15)
Anion gap: 7 (ref 5–15)
BUN: 16 mg/dL (ref 6–20)
BUN: 15 mg/dL (ref 6–20)
CALCIUM: 7.9 mg/dL — AB (ref 8.9–10.3)
CALCIUM: 7.6 mg/dL — AB (ref 8.9–10.3)
CO2: 20 mmol/L — ABNORMAL LOW (ref 22–32)
CO2: 20 mmol/L — AB (ref 22–32)
CREATININE: 0.66 mg/dL (ref 0.61–1.24)
CREATININE: 0.65 mg/dL (ref 0.61–1.24)
Chloride: 106 mmol/L (ref 101–111)
Chloride: 110 mmol/L (ref 101–111)
GFR calc Af Amer: >60 mL/min (ref >60)
GLUCOSE: 104 mg/dL — AB (ref 65–99)
Glucose, Bld: 128 mg/dL — ABNORMAL HIGH (ref 65–99)
Potassium: 2.9 mmol/L — ABNORMAL LOW (ref 3.5–5.1)
Potassium: 2.7 mmol/L — CL (ref 3.5–5.1)
SODIUM: 137 mmol/L (ref 135–145)
SODIUM: 137 mmol/L (ref 135–145)

## 2017-11-12 LAB — MAGNESIUM
MAGNESIUM: 2.3 mg/dL (ref 1.7–2.4)
MAGNESIUM: 1.9 mg/dL (ref 1.7–2.4)
Magnesium: 1.8 mg/dL (ref 1.7–2.4)

## 2017-11-12 LAB — PHOSPHORUS
Phosphorus: 1.8 mg/dL — ABNORMAL LOW (ref 2.5–4.6)
Phosphorus: 2.3 mg/dL — ABNORMAL LOW (ref 2.5–4.6)
Phosphorus: 1.8 mg/dL — ABNORMAL LOW (ref 2.5–4.6)

## 2017-11-12 LAB — GLUCOSE, CAPILLARY
GLUCOSE-CAPILLARY: 137 mg/dL — AB (ref 65–99)
Glucose-Capillary: 109 mg/dL — ABNORMAL HIGH (ref 65–99)
Glucose-Capillary: 143 mg/dL — ABNORMAL HIGH (ref 65–99)
Glucose-Capillary: 157 mg/dL — ABNORMAL HIGH (ref 65–99)

## 2017-11-12 LAB — AMMONIA: Ammonia: 38 umol/L — ABNORMAL HIGH (ref 9–35)

## 2017-11-12 LAB — HEPATITIS PANEL, ACUTE
HEP B S AG: NEGATIVE
Hep A IgM: NEGATIVE
Hep B C IgM: NEGATIVE

## 2017-11-12 MED ORDER — POTASSIUM PHOSPHATES 15 MMOLE/5ML IV SOLN
40.0000 meq | Freq: Once | INTRAVENOUS | Status: AC
  Administered 2017-11-12: 40 meq via INTRAVENOUS
  Filled 2017-11-12: qty 9.09

## 2017-11-12 MED ORDER — VITAL HIGH PROTEIN PO LIQD
1000.0000 mL | ORAL | Status: DC
  Filled 2017-11-12: qty 1000

## 2017-11-12 MED ORDER — PIPERACILLIN-TAZOBACTAM 3.375 G IVPB
3.3750 g | Freq: Three times a day (TID) | INTRAVENOUS | Status: DC
  Administered 2017-11-12 – 2017-11-14 (×5): 3.375 g via INTRAVENOUS
  Filled 2017-11-12 (×5): qty 50

## 2017-11-12 MED ORDER — PANTOPRAZOLE SODIUM 40 MG IV SOLR
40.0000 mg | Freq: Two times a day (BID) | INTRAVENOUS | Status: DC
  Administered 2017-11-12 – 2017-11-17 (×11): 40 mg via INTRAVENOUS
  Filled 2017-11-12 (×11): qty 40

## 2017-11-12 MED ORDER — PRO-STAT SUGAR FREE PO LIQD
60.0000 mL | Freq: Three times a day (TID) | ORAL | Status: DC
  Administered 2017-11-12 – 2017-11-14 (×6): 60 mL
  Filled 2017-11-12 (×6): qty 60

## 2017-11-12 MED ORDER — PRO-STAT SUGAR FREE PO LIQD
30.0000 mL | Freq: Two times a day (BID) | ORAL | Status: DC
  Administered 2017-11-12: 30 mL
  Filled 2017-11-12: qty 30

## 2017-11-12 MED ORDER — INSULIN ASPART 100 UNIT/ML ~~LOC~~ SOLN
0.0000 [IU] | SUBCUTANEOUS | Status: DC
  Administered 2017-11-12: 1 [IU] via SUBCUTANEOUS
  Administered 2017-11-12: 2 [IU] via SUBCUTANEOUS
  Administered 2017-11-13 – 2017-11-16 (×8): 1 [IU] via SUBCUTANEOUS
  Administered 2017-11-17: 5 [IU] via SUBCUTANEOUS
  Administered 2017-11-18 (×2): 1 [IU] via SUBCUTANEOUS

## 2017-11-12 MED ORDER — VITAL 1.5 CAL PO LIQD
1000.0000 mL | ORAL | Status: DC
  Administered 2017-11-12 – 2017-11-13 (×2): 1000 mL
  Filled 2017-11-12 (×3): qty 1000

## 2017-11-12 MED ORDER — METOPROLOL TARTRATE 25 MG/10 ML ORAL SUSPENSION
25.0000 mg | Freq: Two times a day (BID) | ORAL | Status: DC
  Administered 2017-11-12 – 2017-11-16 (×7): 25 mg
  Filled 2017-11-12 (×11): qty 10

## 2017-11-12 MED ORDER — LACTULOSE 10 GM/15ML PO SOLN
20.0000 g | Freq: Three times a day (TID) | ORAL | Status: DC
  Administered 2017-11-12 (×2): 20 g
  Administered 2017-11-13: 10 g
  Administered 2017-11-13 – 2017-11-17 (×8): 20 g
  Filled 2017-11-12 (×11): qty 30

## 2017-11-12 MED ORDER — ACETAMINOPHEN 160 MG/5ML PO SOLN
650.0000 mg | Freq: Once | ORAL | Status: AC
  Administered 2017-11-12: 650 mg
  Filled 2017-11-12: qty 20.3

## 2017-11-12 MED ORDER — VANCOMYCIN HCL 10 G IV SOLR
1500.0000 mg | Freq: Two times a day (BID) | INTRAVENOUS | Status: DC
  Administered 2017-11-12 – 2017-11-13 (×3): 1500 mg via INTRAVENOUS
  Filled 2017-11-12 (×4): qty 1500

## 2017-11-12 MED ORDER — POTASSIUM CHLORIDE 20 MEQ/15ML (10%) PO SOLN
40.0000 meq | Freq: Once | ORAL | Status: AC
  Administered 2017-11-12: 40 meq
  Filled 2017-11-12: qty 30

## 2017-11-12 MED ORDER — LORAZEPAM 2 MG/ML IJ SOLN
2.0000 mg | Freq: Three times a day (TID) | INTRAMUSCULAR | Status: DC
Start: 2017-11-12 — End: 2017-11-14
  Administered 2017-11-12 – 2017-11-14 (×8): 2 mg via INTRAVENOUS
  Filled 2017-11-12 (×8): qty 1

## 2017-11-12 MED ORDER — POTASSIUM CHLORIDE 10 MEQ/100ML IV SOLN
10.0000 meq | INTRAVENOUS | Status: AC
Start: 2017-11-12 — End: 2017-11-13
  Administered 2017-11-12 – 2017-11-13 (×4): 10 meq via INTRAVENOUS
  Filled 2017-11-12 (×4): qty 100

## 2017-11-12 MED ORDER — CLONIDINE HCL 0.1 MG PO TABS
0.1000 mg | ORAL_TABLET | Freq: Three times a day (TID) | ORAL | Status: DC
  Administered 2017-11-12 – 2017-11-13 (×4): 0.1 mg via ORAL
  Filled 2017-11-12 (×4): qty 1

## 2017-11-12 MED ORDER — VANCOMYCIN HCL 10 G IV SOLR
1500.0000 mg | Freq: Once | INTRAVENOUS | Status: AC
  Administered 2017-11-12: 1500 mg via INTRAVENOUS
  Filled 2017-11-12: qty 1500

## 2017-11-12 NOTE — Progress Notes (Signed)
Organ  Patient known to pharmacy from Vancomycin dosing (see full note earlier today).  Patient has been on Unasyn since 11/24.  Patient remains febrile (currently 101.8).  Unasyn has been d/c'ed and pharmacy consulted to dose Zosyn for aspiration pneumonia.  CrCl : 98 ml/min  Plan: Zosyn 3.375gm IV q8h (each dose infused over 4 hrs) Patient continues on Vancomycin Follow SCr daily while on both Zosyn and Vancomycin  Leone Haven, PharmD 11/12/17 @ 17:23

## 2017-11-12 NOTE — Progress Notes (Signed)
Initial Nutrition Assessment  INTERVENTION:   Initiate Vital 1.5 @ 30 ml/hr via NGT.  60 ml Prostat TID.   Tube feeding regimen + current Propofol infusion provides 2200 kcal (100% of needs), 137 grams of protein, and 550 ml of H2O.   RD will continue to monitor  NUTRITION DIAGNOSIS:   Inadequate oral intake related to inability to eat as evidenced by NPO status.  GOAL:   Patient will meet greater than or equal to 90% of their needs  MONITOR:   Vent status, Labs, Weight trends, I & O's, TF tolerance  REASON FOR ASSESSMENT:   Consult Enteral/tube feeding initiation and management  ASSESSMENT:   71 year old male who presented to [facility] long hospital on 11/22 with reports of alcohol withdrawal. On admission the patient reported his last drink was 2-3 days prior to presentation.  He reported pain and tremors.  Initial alcohol level was 118.  UDS was positive for benzos and opiates.   Pt's reported alcohol intake is half a fifth daily. Currently going through withdrawal. Weight has increased since earlier this year.  Patient is currently intubated on ventilator support MV: 11.5 L/min Temp (24hrs), Avg:100 F (37.8 C), Min:98.4 F (36.9 C), Max:101.2 F (38.4 C)  Propofol: 19.7 ml/hr -provides 520 kcal  Medications: IV Protonix BID, Thiamine tablet daily, IV K-Phos once Labs reviewed: Low K, Phos Mg WNL TG: 86 mg/dL  NUTRITION - FOCUSED PHYSICAL EXAM:  Nutrition focused physical exam shows no sign of depletion of muscle mass or body fat.  Diet Order:  No diet orders on file  EDUCATION NEEDS:   No education needs have been identified at this time  Skin:  Skin Assessment: Reviewed RN Assessment  Last BM:  11/21  Height:   Ht Readings from Last 1 Encounters:  11/09/17 5\' 10"  (1.778 m)    Weight:   Wt Readings from Last 1 Encounters:  11/09/17 206 lb 12.7 oz (93.8 kg)    Ideal Body Weight:  75.5 kg  BMI:  Body mass index is 29.67 kg/m.  Estimated  Nutritional Needs:   Kcal:  2197  Protein:  135-145g  Fluid:  2L/day  Clayton Bibles, MS, RD, LDN [place] [facility] Pager: [phone] After Hours Pager: [phone]

## 2017-11-12 NOTE — Progress Notes (Signed)
Spoke with MD at [facility] aware of temp 101  Waiting at this time to see if temp goes up . ? Temp is part of withdraw. If truly from infection then will monitor and give when temps increases.  Per Md.   I increased propofol earlier d/t pt having tremors and diaphoretic also had to give 2mg  versed at that time.

## 2017-11-12 NOTE — Progress Notes (Signed)
[place] Progress Note Patient Name: KESHAUN DUBEY DOB: 1945-12-23 MRN: [id number]   Date of Service  11/12/2017  HPI/Events of Note    eICU Interventions  Hypokalemia -repleted      Intervention Category Intermediate Interventions: Electrolyte abnormality - evaluation and management  Rakesh V. Alva 11/12/2017, 10:11 PM

## 2017-11-12 NOTE — Progress Notes (Signed)
Pharmacy Antibiotic Note  Joshua Henson is a 71 y.o. male admitted on 11/09/2017 with alcohol withdrawal.  Pharmacy has been consulted for vancomycin dosing for PNA.  Plan: Vancomycin 1500mg  IV q12h (AUC 525.4, Scr 0.66) Unasyn 1.5mg  IV q6 per MD Follow renal function, cultures and clinical course  Height: 5\' 10"  (177.8 cm) Weight: 206 lb 12.7 oz (93.8 kg) IBW/kg (Calculated) : 73  Temp (24hrs), Avg:100 F (37.8 C), Min:98.4 F (36.9 C), Max:101.2 F (38.4 C)  Recent Labs  Lab 11/09/17 1252 11/09/17 1308 11/09/17 1539 11/09/17 1735 11/09/17 2003 11/10/17 0247 11/11/17 0257 11/11/17 0724 11/12/17 0311  WBC 4.7  --   --   --   --  3.5*  --  7.3  --   CREATININE 0.92  --   --   --   --  0.73 0.79  --  0.66  LATICACIDVEN  --  8.04* 3.51* 2.5* 1.2  --   --   --   --     Estimated Creatinine Clearance: 98.8 mL/min (by C-G formula based on SCr of 0.66 mg/dL).    Allergies  Allergen Reactions  . Lisinopril     syncope  . Celebrex [Celecoxib] Rash    Antimicrobials this admission: 11/24 unasyn >> 11/25 vancomycin >> Dose adjustments this admission:   Microbiology results: 11/24 Sputum: multiple    Thank you for allowing pharmacy to be a part of this patient's care.  Dolly Rias RPh 11/12/2017, 10:11 AM Pager [phone]

## 2017-11-12 NOTE — Progress Notes (Signed)
Spoke with Titus Mould, MD in regards to patient's temperature of 101.8.  Verbal orders provided are as follows: 650 mg Tylenol x 1 dose, blood cultures x2, Zosyn per pharmacy consult, and discontinue Unasyn.  Orders executed.  Will continue to monitor.

## 2017-11-12 NOTE — Consult Note (Addendum)
PULMONARY / CRITICAL CARE MEDICINE   Name: QUAYSHAUN HUBBERT MRN: [id number] DOB: 01/06/1946    ADMISSION DATE:  11/09/2017 CONSULTATION DATE: 11/23  REFERRING MD: Dr. Erlinda Hong  CHIEF COMPLAINT:  ETOH withdrawal   HISTORY OF PRESENT ILLNESS: 71 year old male who presented to [facility] long hospital on 11/22 with reports of alcohol withdrawal.  On admission the patient reported his last drink was 2-3 days prior to presentation.  He reported pain and tremors.  Initial alcohol level was 118.  UDS was positive for benzos and opiates.  Initial labs notable for hypokalemia, anion gap of 21, lactic acid 8.04, MCV 99.7.  CXR negative for acute process.  The patient was admitted by [facility] and placed on alcohol withdrawal protocol.  Due to patient's prior history of multiple admissions with need for Precedex, PCCM consulted for evaluation.  Chart review shows the patient has had 23 mg of Ativan since 1 PM on 11/22.     SUBJECTIVE:  Intubated fever  VITAL SIGNS: BP (!) 152/97 (BP Location: Right Arm)   Pulse 94   Temp (!) 101.2 F (38.4 C) (Oral) Comment: reported to rn   Resp 19   Ht 5' 10"  (1.778 m)   Wt 93.8 kg (206 lb 12.7 oz)   SpO2 96%   BMI 29.67 kg/m   HEMODYNAMICS:    VENTILATOR SETTINGS: Vent Mode: PRVC FiO2 (%):  [30 %-100 %] 30 % Set Rate:  [12 bmp-15 bmp] 12 bmp Vt Set:  [600 mL] 600 mL PEEP:  [5 cmH20] 5 cmH20 Plateau Pressure:  [12 cmH20-21 cmH20] 14 cmH20  INTAKE / OUTPUT: I/O last 3 completed shifts: In: 6421.4 [I.V.:5397.3; Other:165; IV Piggyback:859.1] Out: 1975 [Urine:1705; Emesis/NG output:270]  PHYSICAL EXAMINATION: General: calm, sedated vent Neuro: sedated calmer, moves all etx, min FC HEENT: ett, jvd down PULM: CTA, int scatt ronchi CV:  s1 s2 RRR not tachy GI: soft, BS wnl, liver border Extremities: edema mild    LABS:  BMET Recent Labs  Lab 11/10/17 0247 11/11/17 0257 11/12/17 0311  NA 135 135 137  K 3.6 3.0* 2.9*  CL 98* 100* 106  CO2 26 19*  20*  BUN 7 9 16   CREATININE 0.73 0.79 0.66  GLUCOSE 99 114* 104*    Electrolytes Recent Labs  Lab 11/10/17 0247 11/11/17 0257 11/12/17 0311  CALCIUM 8.2* 8.1* 7.9*  MG 1.6* 1.5* 2.3  PHOS  --   --  1.8*    CBC Recent Labs  Lab 11/09/17 1252 11/10/17 0247 11/11/17 0724  WBC 4.7 3.5* 7.3  HGB 13.0 12.4* 13.3  HCT 36.0* 35.5* 38.1*  PLT 154 128* 151    Coag's No results for input(s): APTT, INR in the last 168 hours.  Sepsis Markers Recent Labs  Lab 11/09/17 1539 11/09/17 1735 11/09/17 2003  LATICACIDVEN 3.51* 2.5* 1.2    ABG Recent Labs  Lab 11/11/17 1100 11/11/17 1530  PHART 7.521* 7.385  PCO2ART 22.4* 35.6  PO2ART 71.6* 315*    Liver Enzymes Recent Labs  Lab 11/09/17 1252 11/10/17 0247 11/11/17 0257  AST 90* 70* 63*  ALT 30 27 24   ALKPHOS 81 77 83  BILITOT 1.2 2.0* 1.9*  ALBUMIN 3.8 3.7 3.7    Cardiac Enzymes No results for input(s): TROPONINI, PROBNP in the last 168 hours.  Glucose No results for input(s): GLUCAP in the last 168 hours.  Imaging Dg Chest Port 1 View  Result Date: 11/11/2017 CLINICAL DATA:  70 year old male for evaluation of endotracheal and enteric tube placement.  EXAM: PORTABLE CHEST 1 VIEW COMPARISON:  11/11/2017 FINDINGS: There has been interval placement of an endotracheal tube which terminates approximately 7.7 cm above the level of the carina. An enteric tube courses below the diaphragm beyond the edge of the film. Cardiomediastinal silhouette is partially visualized. Visualized portions of the lung are clear. No acute osseous abnormalities. IMPRESSION: Interval intubation with endotracheal tube approximately 7.7 cm above the level of the carina. Consider advancing further and reimaging. Electronically Signed   By: Kristopher Oppenheim M.D.   On: 11/11/2017 13:54   Dg Chest Port 1 View  Result Date: 11/11/2017 CLINICAL DATA:  Acute respiratory failure. EXAM: PORTABLE CHEST 1 VIEW COMPARISON:  Radiographs of November 09, 2017. FINDINGS: Stable cardiomediastinal silhouette. No pneumothorax or pleural effusion is noted. Both lungs are clear. The visualized skeletal structures are unremarkable. IMPRESSION: No acute cardiopulmonary abnormality seen. Electronically Signed   By: Marijo Conception, M.D.   On: 11/11/2017 10:50   Dg Abd Portable 1v  Result Date: 11/11/2017 CLINICAL DATA:  71 year old male for evaluation of enteric tube placement. EXAM: PORTABLE ABDOMEN - 1 VIEW COMPARISON:  None. FINDINGS: An enteric tube courses below the diaphragm and terminates within the gastric body. Visualized portions of bowel gas pattern are normal. IMPRESSION: Enteric tube courses below the diaphragm and terminates at the level of the gastric body. Electronically Signed   By: Kristopher Oppenheim M.D.   On: 11/11/2017 13:54     STUDIES:  RUQ [place] 11/23 >>   CULTURES: Sputum 11/24>>>  ANTIBIOTICS: unasyn 1/24>>> vanc 1/25>>>  SIGNIFICANT EVENTS: 11/22  Admit with ETOH withdrawal, level 118 on presentation   11/24 asp, ett  LINES/TUBES: ett 1/24>>>  DISCUSSION: 71 y/o M with prior hx of ETOH withdrawal admitted on 11/22 with recurrent withdrawal symptoms.  Reportedly drinks a half of a fifth per day.  Chronic benzo/opiate use.    ASSESSMENT / PLAN:  NEUROLOGIC A:   Acute alcohol withdrawal Agitated delirium P:   Prop, WUA Consider further scheduled benzo  PULMONARY A: Acute resp failure, aspiration sputum P:   pcxr now, re assess infiltrates and advanced ett pcxr in am  Was alk, MV reduced, abg wnl SBT okay this am if prop can be reduced  CARDIOVASCULAR A:  Sinus Rhythm  Hypertension  P:  Clon to tid oral Tele Dc patch Consider BB addition to toral scheduled  RENAL A:   Hypokalemia Hypophos DT's P:   k ,phos supp Lytes in pm  Pos balance okay again today Hydration Chem in am   GASTROINTESTINAL A:   At risk of aspiration Mild Elevation of LFT's Fatty liver Hepatic enceph  contirbution Stress gastritis? P:   NPO strict Lactulose, empiric, no role ammonia levels Start TF Change pepcide to BID pPI  HEMATOLOGIC A:  DVT prevention P:  Lovenox for DVT prophylaxis  crt in am  Cbc in am   INFECTIOUS A:  aspiration P:   pcxr now again unasyn Fever - add vanc Follow sputum  ENDOCRINE A:   At risk for hyper/hypoglycemia P:   Monitor glucose, cbg  FAMILY  - Updates: No family at bedside.     Ccm time 30 min   Lavon Paganini. Titus Mould, MD, [place] Pgr: [place] [facility]

## 2017-11-13 ENCOUNTER — Inpatient Hospital Stay (HOSPITAL_COMMUNITY): Payer: Medicare Other

## 2017-11-13 LAB — CBC WITH DIFFERENTIAL/PLATELET
BASOS ABS: 0 10*3/uL (ref 0.0–0.1)
Basophils Relative: 1 %
EOS PCT: 1 %
Eosinophils Absolute: 0 10*3/uL (ref 0.0–0.7)
HEMATOCRIT: 30.8 % — AB (ref 39.0–52.0)
Hemoglobin: 10.6 g/dL — ABNORMAL LOW (ref 13.0–17.0)
LYMPHS PCT: 19 %
Lymphs Abs: 0.8 10*3/uL (ref 0.7–4.0)
MCH: 35.5 pg — AB (ref 26.0–34.0)
MCHC: 34.4 g/dL (ref 30.0–36.0)
MCV: 103 fL — AB (ref 78.0–100.0)
MONO ABS: 0.6 10*3/uL (ref 0.1–1.0)
MONOS PCT: 14 %
NEUTROS ABS: 2.8 10*3/uL (ref 1.7–7.7)
Neutrophils Relative %: 65 %
PLATELETS: 112 10*3/uL — AB (ref 150–400)
RBC: 2.99 MIL/uL — ABNORMAL LOW (ref 4.22–5.81)
RDW: 14.3 % (ref 11.5–15.5)
WBC: 4.3 10*3/uL (ref 4.0–10.5)

## 2017-11-13 LAB — COMPREHENSIVE METABOLIC PANEL
ALK PHOS: 65 U/L (ref 38–126)
ALT: 18 U/L (ref 17–63)
AST: 46 U/L — AB (ref 15–41)
Albumin: 2.4 g/dL — ABNORMAL LOW (ref 3.5–5.0)
Anion gap: 5 (ref 5–15)
BILIRUBIN TOTAL: 1.1 mg/dL (ref 0.3–1.2)
BUN: 12 mg/dL (ref 6–20)
CO2: 20 mmol/L — AB (ref 22–32)
Calcium: 7.8 mg/dL — ABNORMAL LOW (ref 8.9–10.3)
Chloride: 112 mmol/L — ABNORMAL HIGH (ref 101–111)
Creatinine, Ser: 0.6 mg/dL — ABNORMAL LOW (ref 0.61–1.24)
GLUCOSE: 121 mg/dL — AB (ref 65–99)
Potassium: 3.3 mmol/L — ABNORMAL LOW (ref 3.5–5.1)
Sodium: 137 mmol/L (ref 135–145)
TOTAL PROTEIN: 5.5 g/dL — AB (ref 6.5–8.1)

## 2017-11-13 LAB — GLUCOSE, CAPILLARY
GLUCOSE-CAPILLARY: 118 mg/dL — AB (ref 65–99)
GLUCOSE-CAPILLARY: 137 mg/dL — AB (ref 65–99)
GLUCOSE-CAPILLARY: 127 mg/dL — AB (ref 65–99)
Glucose-Capillary: 133 mg/dL — ABNORMAL HIGH (ref 65–99)
Glucose-Capillary: 128 mg/dL — ABNORMAL HIGH (ref 65–99)
Glucose-Capillary: 146 mg/dL — ABNORMAL HIGH (ref 65–99)

## 2017-11-13 LAB — PHOSPHORUS
PHOSPHORUS: 1.9 mg/dL — AB (ref 2.5–4.6)
Phosphorus: 1.8 mg/dL — ABNORMAL LOW (ref 2.5–4.6)

## 2017-11-13 LAB — MAGNESIUM
MAGNESIUM: 1.8 mg/dL (ref 1.7–2.4)
Magnesium: 1.9 mg/dL (ref 1.7–2.4)

## 2017-11-13 LAB — CULTURE, RESPIRATORY: CULTURE: NORMAL

## 2017-11-13 LAB — CULTURE, RESPIRATORY W GRAM STAIN

## 2017-11-13 LAB — MRSA PCR SCREENING: MRSA BY PCR: NEGATIVE

## 2017-11-13 MED ORDER — POTASSIUM CHLORIDE 20 MEQ/15ML (10%) PO SOLN
40.0000 meq | ORAL | Status: AC
  Administered 2017-11-13: 40 meq
  Filled 2017-11-13: qty 30

## 2017-11-13 MED ORDER — CLONIDINE HCL 0.1 MG PO TABS
0.2000 mg | ORAL_TABLET | Freq: Three times a day (TID) | ORAL | Status: DC
  Administered 2017-11-13 – 2017-11-17 (×14): 0.2 mg via ORAL
  Filled 2017-11-13 (×14): qty 2

## 2017-11-13 MED ORDER — ASPIRIN 81 MG PO CHEW
162.0000 mg | CHEWABLE_TABLET | Freq: Every day | ORAL | Status: DC
  Administered 2017-11-13 – 2017-11-17 (×5): 162 mg
  Filled 2017-11-13 (×5): qty 2

## 2017-11-13 MED ORDER — POTASSIUM PHOSPHATES 15 MMOLE/5ML IV SOLN
20.0000 meq | Freq: Once | INTRAVENOUS | Status: AC
  Administered 2017-11-13: 20 meq via INTRAVENOUS
  Filled 2017-11-13: qty 4.55

## 2017-11-13 MED ORDER — MAGNESIUM SULFATE 2 GM/50ML IV SOLN
2.0000 g | Freq: Once | INTRAVENOUS | Status: AC
  Administered 2017-11-13: 2 g via INTRAVENOUS
  Filled 2017-11-13: qty 50

## 2017-11-13 MED ORDER — LABETALOL HCL 5 MG/ML IV SOLN
10.0000 mg | INTRAVENOUS | Status: DC | PRN
  Administered 2017-11-13 – 2017-11-17 (×3): 10 mg via INTRAVENOUS
  Filled 2017-11-13 (×4): qty 4

## 2017-11-13 MED ORDER — POTASSIUM CHLORIDE 20 MEQ/15ML (10%) PO SOLN
20.0000 meq | ORAL | Status: DC
  Administered 2017-11-13: 20 meq
  Filled 2017-11-13: qty 15

## 2017-11-13 MED ORDER — FOLIC ACID 1 MG PO TABS
1.0000 mg | ORAL_TABLET | Freq: Every day | ORAL | Status: DC
  Administered 2017-11-13 – 2017-11-19 (×7): 1 mg
  Filled 2017-11-13 (×7): qty 1

## 2017-11-13 NOTE — Progress Notes (Signed)
[facility] ADULT ICU REPLACEMENT PROTOCOL FOR AM LAB REPLACEMENT ONLY  The patient does apply for the [facility] Adult ICU Electrolyte Replacment Protocol based on the criteria listed below:   1. Is GFR >/= 40 ml/min? Yes.    Patient's GFR today is >60 2. Is urine output >/= 0.5 ml/kg/hr for the last 6 hours? Yes.   Patient's UOP is 1.2 ml/kg/hr 3. Is BUN < 60 mg/dL? Yes.    Patient's BUN today is 12 4. Abnormal electrolyte(s): K 3.3 5. Ordered repletion with: protocol 6. If a panic level lab has been reported, has the CCM MD in charge been notified? No..   Physician:    Ronda Fairly A 11/13/2017 6:52 AM

## 2017-11-13 NOTE — Consult Note (Signed)
PULMONARY / CRITICAL CARE MEDICINE   Name: Joshua Henson MRN: [id number] DOB: 08-01-1946    ADMISSION DATE:  11/09/2017 CONSULTATION DATE: 11/23  REFERRING MD: Dr. Erlinda Hong  CHIEF COMPLAINT:  ETOH withdrawal   HISTORY OF PRESENT ILLNESS: 71 year old male who presented to [facility] long hospital on 11/22 with reports of alcohol withdrawal.  On admission the patient reported his last drink was 2-3 days prior to presentation.  He reported pain and tremors.  Initial alcohol level was 118.  UDS was positive for benzos and opiates.  Initial labs notable for hypokalemia, anion gap of 21, lactic acid 8.04, MCV 99.7.  CXR negative for acute process.  The patient was admitted by [facility] and placed on alcohol withdrawal protocol.  Due to patient's prior history of multiple admissions with need for Precedex, PCCM consulted for evaluation.  Chart review shows the patient has had 23 mg of Ativan since 1 PM on 11/22.     SUBJECTIVE:  Calmer Secretions lower  VITAL SIGNS: BP (!) 179/108 Comment: BP Meds given 15 minutes ago.   Pulse 89   Temp 98.1 F (36.7 C) (Oral)   Resp (!) 26   Ht 5\' 10"  (1.778 m)   Wt 88.8 kg (195 lb 12.3 oz)   SpO2 93%   BMI 28.09 kg/m   HEMODYNAMICS:    VENTILATOR SETTINGS: Vent Mode: PRVC FiO2 (%):  [30 %] 30 % Set Rate:  [12 bmp] 12 bmp Vt Set:  [600 mL] 600 mL PEEP:  [5 cmH20] 5 cmH20 Pressure Support:  [8 cmH20] 8 cmH20 Plateau Pressure:  [12 cmH20-14 cmH20] 14 cmH20  INTAKE / OUTPUT: I/O last 3 completed shifts: In: 7528.7 [I.V.:4560.7; SWFUX:323; FT/DD:220; IV Piggyback:2259.1] Out: 2695 [Urine:1480; Emesis/NG output:1215]  PHYSICAL EXAMINATION: General: awake, rass 0 Neuro: fc, rass 0 , improved this am  HEENT: jvd wnl PULM: coarse but clear CV:  s1 s2 RRR not tachy GI: soft, bs wnl, no r Extremities: some tremor, he says Is chronic     LABS:  BMET Recent Labs  Lab 11/12/17 0311 11/12/17 2016 11/13/17 0549  NA 137 137 137  K 2.9* 2.7* 3.3*   CL 106 110 112*  CO2 20* 20* 20*  BUN 16 15 12   CREATININE 0.66 0.65 0.60*  GLUCOSE 104* 128* 121*    Electrolytes Recent Labs  Lab 11/12/17 0311 11/12/17 1009 11/12/17 1712 11/12/17 2016 11/13/17 0549  CALCIUM 7.9*  --   --  7.6* 7.8*  MG 2.3 1.9 1.8  --  1.8  PHOS 1.8* 2.3* 1.8*  --  1.8*    CBC Recent Labs  Lab 11/10/17 0247 11/11/17 0724 11/13/17 0549  WBC 3.5* 7.3 4.3  HGB 12.4* 13.3 10.6*  HCT 35.5* 38.1* 30.8*  PLT 128* 151 112*    Coag's No results for input(s): APTT, INR in the last 168 hours.  Sepsis Markers Recent Labs  Lab 11/09/17 1539 11/09/17 1735 11/09/17 2003  LATICACIDVEN 3.51* 2.5* 1.2    ABG Recent Labs  Lab 11/11/17 1100 11/11/17 1530  PHART 7.521* 7.385  PCO2ART 22.4* 35.6  PO2ART 71.6* 315*    Liver Enzymes Recent Labs  Lab 11/10/17 0247 11/11/17 0257 11/13/17 0549  AST 70* 63* 46*  ALT 27 24 18   ALKPHOS 77 83 65  BILITOT 2.0* 1.9* 1.1  ALBUMIN 3.7 3.7 2.4*    Cardiac Enzymes No results for input(s): TROPONINI, PROBNP in the last 168 hours.  Glucose Recent Labs  Lab 11/12/17 1220 11/12/17 1658 11/12/17 1923  11/12/17 2302 11/13/17 0319 11/13/17 0746  GLUCAP 109* 143* 137* 157* 133* 118*    Imaging Dg Chest Port 1 View  Result Date: 11/13/2017 CLINICAL DATA:  Intubation. EXAM: PORTABLE CHEST 1 VIEW COMPARISON:  11/12/2017 FINDINGS: The endotracheal tube terminates near the inferior margin of the clavicular heads, well above the carina. Enteric tube courses into the upper abdomen with tip not imaged. The cardiac silhouette is mildly enlarged. There is increasing airspace opacity in the left lung base. Right lung base opacity is stable to slightly improved and has a predominantly curvilinear configuration suggesting atelectasis. No sizable pleural effusion or pneumothorax is identified. IMPRESSION: 1. Increasing left basilar airspace opacity concerning for pneumonia. 2. Mild right basilar opacity suggestive of  atelectasis. Electronically Signed   By: Logan Bores M.D.   On: 11/13/2017 06:50     STUDIES:  RUQ [place] 11/23 >> faty liver  CULTURES: Sputum 11/24>>> 11/25 BC>>>  ANTIBIOTICS: unasyn 1/24>>>11/25 Zosyn 11/25>>> vanc 1/25>>>  SIGNIFICANT EVENTS: 11/22  Admit with ETOH withdrawal, level 118 on presentation   11/24 asp, ett 11/25 fever  LINES/TUBES: ett 1/24>>>  DISCUSSION: 71 y/o M with prior hx of ETOH withdrawal admitted on 11/22 with recurrent withdrawal symptoms.  Reportedly drinks a half of a fifth per day.  Chronic benzo/opiate use.    ASSESSMENT / PLAN:  NEUROLOGIC A:   Acute alcohol withdrawal Agitated delirium P:   Prop, WUA Scheduled benzo , remain, liked it  Thimaine, folic  PULMONARY A: Acute resp failure, aspiration sputum P:   pcxr with some atx mild linear, repeat in am  Wean cpap 5 PS 5,goal 30 min , assess rsbi Will need IS Follow secretion status  CARDIOVASCULAR A:  Sinus Rhythm  Hypertension  P:  Clonidine tid, increase dose Tele Metop remain Avoid such pos balance  RENAL A:   Hypokalemia Hypophos DT's P:   kvo May need lasix Chem in am kphos mag  GASTROINTESTINAL A:   At risk of aspiration Mild Elevation of LFT's Fatty liver Hepatic enceph contirbution Stress gastritis? P:   Lactulose, empiric BID pPI hct in am   HEMATOLOGIC A:  DVT prevention, up and down plat P:  Lovenox for DVT prophylaxis, follow plat trend Cbc in am   INFECTIOUS A:  Aspiration, fever 11/25 P:   pcxr in am  Vanc,m zosyn, narrow in am likely Follow sputum  ENDOCRINE A:   At risk for hyper/hypoglycemia P:   Monitor glucose, cbg - wnl  FAMILY  - Updates: No family at bedside.   I updated pt  Ccm time 30 min   Lavon Paganini. Titus Mould, MD, [place] Pgr: [facility]

## 2017-11-14 LAB — BASIC METABOLIC PANEL
ANION GAP: 6 (ref 5–15)
BUN: 8 mg/dL (ref 6–20)
CHLORIDE: 113 mmol/L — AB (ref 101–111)
CO2: 19 mmol/L — ABNORMAL LOW (ref 22–32)
Calcium: 8.1 mg/dL — ABNORMAL LOW (ref 8.9–10.3)
Creatinine, Ser: 0.58 mg/dL — ABNORMAL LOW (ref 0.61–1.24)
GFR calc Af Amer: >60 mL/min (ref >60)
GLUCOSE: 122 mg/dL — AB (ref 65–99)
POTASSIUM: 3.4 mmol/L — AB (ref 3.5–5.1)
Sodium: 138 mmol/L (ref 135–145)

## 2017-11-14 LAB — GLUCOSE, CAPILLARY
GLUCOSE-CAPILLARY: 120 mg/dL — AB (ref 65–99)
GLUCOSE-CAPILLARY: 116 mg/dL — AB (ref 65–99)
Glucose-Capillary: 109 mg/dL — ABNORMAL HIGH (ref 65–99)
Glucose-Capillary: 110 mg/dL — ABNORMAL HIGH (ref 65–99)
Glucose-Capillary: 116 mg/dL — ABNORMAL HIGH (ref 65–99)
Glucose-Capillary: 129 mg/dL — ABNORMAL HIGH (ref 65–99)

## 2017-11-14 LAB — MAGNESIUM: Magnesium: 1.9 mg/dL (ref 1.7–2.4)

## 2017-11-14 MED ORDER — HYDRALAZINE HCL 20 MG/ML IJ SOLN
20.0000 mg | Freq: Once | INTRAMUSCULAR | Status: AC
  Administered 2017-11-14: 20 mg via INTRAVENOUS
  Filled 2017-11-14: qty 1

## 2017-11-14 MED ORDER — HYDRALAZINE HCL 25 MG PO TABS
25.0000 mg | ORAL_TABLET | Freq: Three times a day (TID) | ORAL | Status: DC
  Administered 2017-11-14 – 2017-11-17 (×9): 25 mg
  Filled 2017-11-14 (×10): qty 1

## 2017-11-14 MED ORDER — DEXTROSE 5 % IV SOLN
1.0000 g | INTRAVENOUS | Status: DC
  Administered 2017-11-14 – 2017-11-15 (×2): 1 g via INTRAVENOUS
  Filled 2017-11-14 (×2): qty 10

## 2017-11-14 MED ORDER — FUROSEMIDE 10 MG/ML IJ SOLN
40.0000 mg | Freq: Two times a day (BID) | INTRAMUSCULAR | Status: AC
  Administered 2017-11-14 – 2017-11-15 (×4): 40 mg via INTRAVENOUS
  Filled 2017-11-14 (×4): qty 4

## 2017-11-14 MED ORDER — LORAZEPAM 1 MG PO TABS
2.0000 mg | ORAL_TABLET | Freq: Three times a day (TID) | ORAL | Status: DC
  Administered 2017-11-14 – 2017-11-21 (×20): 2 mg via ORAL
  Filled 2017-11-14 (×20): qty 2

## 2017-11-14 MED ORDER — LORAZEPAM 2 MG/ML IJ SOLN
2.0000 mg | INTRAMUSCULAR | Status: DC | PRN
  Administered 2017-11-15 – 2017-11-16 (×7): 2 mg via INTRAVENOUS
  Administered 2017-11-16 – 2017-11-17 (×3): 3 mg via INTRAVENOUS
  Administered 2017-11-17 – 2017-11-19 (×4): 2 mg via INTRAVENOUS
  Administered 2017-11-19: 3 mg via INTRAVENOUS
  Administered 2017-11-19 – 2017-11-21 (×10): 2 mg via INTRAVENOUS
  Filled 2017-11-14: qty 1
  Filled 2017-11-14: qty 2
  Filled 2017-11-14 (×4): qty 1
  Filled 2017-11-14: qty 2
  Filled 2017-11-14 (×2): qty 1
  Filled 2017-11-14: qty 2
  Filled 2017-11-14 (×10): qty 1
  Filled 2017-11-14: qty 2
  Filled 2017-11-14: qty 1
  Filled 2017-11-14 (×2): qty 2
  Filled 2017-11-14 (×2): qty 1

## 2017-11-14 MED ORDER — POTASSIUM CHLORIDE 20 MEQ/15ML (10%) PO SOLN
40.0000 meq | ORAL | Status: AC
  Administered 2017-11-14 (×2): 40 meq
  Filled 2017-11-14 (×2): qty 30

## 2017-11-14 NOTE — Progress Notes (Addendum)
Nutrition Follow-up  DOCUMENTATION CODES:   Not applicable  INTERVENTION:  - Diet advancement as medically feasible. - RD will monitor for nutrition-related needs at follow-up.   NUTRITION DIAGNOSIS:   Inadequate oral intake related to inability to eat as evidenced by NPO status. -ongoing  GOAL:   Patient will meet greater than or equal to 90% of their needs -unable to meet at this time.   MONITOR:   Diet advancement, Weight trends, Labs  ASSESSMENT:   71 year old male who presented to [facility] hospital on 11/22 with reports of alcohol withdrawal. On admission the patient reported his last drink was 2-3 days prior to presentation.  He reported pain and tremors.  Initial alcohol level was 118.  UDS was positive for benzos and opiates.   11/27 Pt was extubated earlier this AM and TF was placed on hold prior to that event. He was previously receiving Vital 1.5 @ 30 mL/hr with 60 mL Prostat TID. This regimen provided 1680 kcal, 139 grams of protein, and 550 mL free water. Estimated nutrition needs updated based on extubation and based on current weight; will monitor weight trends closely.   Dr. Janit Pagan note from this AM states pt is at risk for aspiration, has fatty liver, has hepatic encephalopathy, and possible stress gastritis.    Medications reviewed; 1 mg folic acid/day, sliding scale Novolog, 20 g lactulose TID, 2 g IV Mg sulfate x1 run yesterday, 40 mg IV Protonix BID, 40 mEq KCl per OGT x2 doses today, 20 mEq IV KPhos x1 run yesterday, 100 mg thiamine/day.  Labs reviewed; CBGs: 110 and 120 mg/dL this AM, K: 3.4 mmol/L, Cl: 113 mmol/L, creatinine: 0.58 mg/dL, Ca: 8.1 mg/dL.       11/25 - Pt's reported alcohol intake is half a fifth daily.  - Currently going through withdrawal.  - Weight has increased since earlier this year.  Patient is currently intubated on ventilator support MV: 11.5 L/min Temp (24hrs), Avg:100 F (37.8 C), Min:98.4 F (36.9 C), Max:101.2  F (38.4 C) Propofol: 19.7 ml/hr -provides 520 kcal      Diet Order:  Diet NPO time specified  EDUCATION NEEDS:   No education needs have been identified at this time  Skin:  Skin Assessment: Reviewed RN Assessment  Last BM:  11/27  Height:   Ht Readings from Last 1 Encounters:  11/13/17 5\' 10"  (1.778 m)    Weight:   Wt Readings from Last 1 Encounters:  11/14/17 202 lb 2.6 oz (91.7 kg)    Ideal Body Weight:  75.5 kg  BMI:  Body mass index is 29.01 kg/m.  Estimated Nutritional Needs:   Kcal:  1650-2020 (18-22 kcal/kg)  Protein:  75-90 grams  Fluid:  >/= 1.8 L/day      Jarome Matin, MS, RD, LDN, [facility] Inpatient Clinical Dietitian Pager # [phone] After hours/weekend pager # [phone]

## 2017-11-14 NOTE — Procedures (Signed)
Extubation Procedure Note  Patient Details:   Name: Joshua Henson DOB: September 22, 1946 MRN: [id number]   Airway Documentation:     Evaluation  O2 sats: stable throughout Complications: No apparent complications Patient did tolerate procedure well. Bilateral Breath Sounds: Diminished   Yes  Tamera Reason 11/14/2017, 10:14 AM

## 2017-11-14 NOTE — Consult Note (Signed)
PULMONARY / CRITICAL CARE MEDICINE   Name: Joshua Henson MRN: [id number] DOB: [birth year]    ADMISSION DATE:  11/09/2017 CONSULTATION DATE: 11/23  REFERRING MD: Dr. Erlinda Hong  CHIEF COMPLAINT:  ETOH withdrawal   HISTORY OF PRESENT ILLNESS: 71 year old male who presented to [place] [facility] hospital on 11/22 with reports of alcohol withdrawal.  On admission the patient reported his last drink was 2-3 days prior to presentation.  He reported pain and tremors.  Initial alcohol level was 118.  UDS was positive for benzos and opiates.  Initial labs notable for hypokalemia, anion gap of 21, lactic acid 8.04, MCV 99.7.  CXR negative for acute process.  The patient was admitted by [facility] and placed on alcohol withdrawal protocol.  Due to patient's prior history of multiple admissions with need for Precedex, PCCM consulted for evaluation.  Chart review shows the patient has had 23 mg of Ativan since 1 PM on 11/22.     SUBJECTIVE:  Weaned with high rr   VITAL SIGNS: BP (!) 185/110 (BP Location: Right Arm)   Pulse (!) 57   Temp 98.7 F (37.1 C) (Axillary)   Resp (!) 21   Ht 5\' 10"  (1.778 m)   Wt 91.7 kg (202 lb 2.6 oz)   SpO2 98%   BMI 29.01 kg/m   HEMODYNAMICS:    VENTILATOR SETTINGS: Vent Mode: PRVC FiO2 (%):  [30 %] 30 % Set Rate:  [12 bmp] 12 bmp Vt Set:  [580 mL-600 mL] 580 mL PEEP:  [5 cmH20] 5 cmH20 Plateau Pressure:  [14 cmH20-18 cmH20] 18 cmH20  INTAKE / OUTPUT: I/O last 3 completed shifts: In: 6005.4 [I.V.:2300.8; Other:60; NG/GT:1240; IV Piggyback:2404.6] Out: 1025 [Urine:1670; Emesis/NG output:685; Stool:2]  PHYSICAL EXAMINATION: General: awakens, rass -2 Neuro: rass - 2 HEENT: jvd wnl PULM: coarse bs bases, anterior CTA CV: s1 s2 RRR no r GI: soft, obese, NT, nd no r Extremities: edema none      LABS:  BMET Recent Labs  Lab 11/12/17 2016 11/13/17 0549 11/14/17 0357  NA 137 137 138  K 2.7* 3.3* 3.4*  CL 110 112* 113*  CO2 20* 20* 19*  BUN 15 12 8    CREATININE 0.65 0.60* 0.58*  GLUCOSE 128* 121* 122*    Electrolytes Recent Labs  Lab 11/12/17 1712 11/12/17 2016 11/13/17 0549 11/13/17 2019 11/14/17 0357  CALCIUM  --  7.6* 7.8*  --  8.1*  MG 1.8  --  1.8 1.9 1.9  PHOS 1.8*  --  1.8* 1.9*  --     CBC Recent Labs  Lab 11/10/17 0247 11/11/17 0724 11/13/17 0549  WBC 3.5* 7.3 4.3  HGB 12.4* 13.3 10.6*  HCT 35.5* 38.1* 30.8*  PLT 128* 151 112*    Coag's No results for input(s): APTT, INR in the last 168 hours.  Sepsis Markers Recent Labs  Lab 11/09/17 1539 11/09/17 1735 11/09/17 2003  LATICACIDVEN 3.51* 2.5* 1.2    ABG Recent Labs  Lab 11/11/17 1100 11/11/17 1530  PHART 7.521* 7.385  PCO2ART 22.4* 35.6  PO2ART 71.6* 315*    Liver Enzymes Recent Labs  Lab 11/10/17 0247 11/11/17 0257 11/13/17 0549  AST 70* 63* 46*  ALT 27 24 18   ALKPHOS 77 83 65  BILITOT 2.0* 1.9* 1.1  ALBUMIN 3.7 3.7 2.4*    Cardiac Enzymes No results for input(s): TROPONINI, PROBNP in the last 168 hours.  Glucose Recent Labs  Lab 11/13/17 0746 11/13/17 1128 11/13/17 1532 11/13/17 1927 11/13/17 2300 11/14/17 0303  GLUCAP 118*  137* 127* 128* 146* 110*    Imaging No results found.   STUDIES:  RUQ [place] 11/23 >> faty liver  CULTURES: Sputum 11/24>>>NF 11/25 BC>>>  ANTIBIOTICS: unasyn 1/24>>>11/25 Zosyn 11/25>>>1/27 vanc 1/25>>>1/27 CTX 1/27>>>  SIGNIFICANT EVENTS: 11/22  Admit with ETOH withdrawal, level 118 on presentation   11/24 asp, ett 11/25 fever  LINES/TUBES: ett 1/24>>>  DISCUSSION: 71 y/o M with prior hx of ETOH withdrawal admitted on 11/22 with recurrent withdrawal symptoms.  Reportedly drinks a half of a fifth per day.  Chronic benzo/opiate use.    ASSESSMENT / PLAN:  NEUROLOGIC A:   Acute alcohol withdrawal Agitated delirium P:   Prop, WUA mandatory, can dc prop easily, may be able to dc all together Scheduled benzo,. Improved status with this Thimaine, folic  PULMONARY A: Acute  resp failure, aspiration sputum P:   Continued pos balance, consider neg balance goals Repeat weaning PS 5, cpap 5, may tolerate rr 35, if volumes are adequate Repeat pcxr in am Upright position, secretions have improved  CARDIOVASCULAR A:  Sinus Rhythm  Hypertension  P:  Clonidine tid, limited response Tele Metop remain, some brady, unable to increase Avoid such pos balance Lasix Hydralazine add with brady  RENAL A:   Hypokalemia P:   kvo Lasix start Chem In am  k supp  GASTROINTESTINAL A:   At risk of aspiration Mild Elevation of LFT's Fatty liver Hepatic enceph contirbution Stress gastritis? P:   Lactulose, empiric, maintain BID pPITF  HEMATOLOGIC A:  DVT prevention, up and down plat P:  Lovenox for DVT prophylaxis, follow plat trend in am further Cbc in am   INFECTIOUS A:  Aspiration, fever 11/25, improved P:   NF, change all abx to CTX  ENDOCRINE A:   At risk for hyper/hypoglycemia P:   Monitor glucose, cbg - wnl  FAMILY  - Updates: No family at bedside.   I updated pt  Ccm time 30 min   Lavon Paganini. Titus Mould, MD, [place] Pgr: [facility]

## 2017-11-14 NOTE — Progress Notes (Signed)
Date: November 14, 2017 [name], BSN, [place], [place] Chart and notes review for patient progress and needs./Remains on full vent support and septic Will follow for case management and discharge needs. Next review date: [id number]

## 2017-11-15 ENCOUNTER — Inpatient Hospital Stay (HOSPITAL_COMMUNITY): Payer: Medicare Other

## 2017-11-15 LAB — CBC WITH DIFFERENTIAL/PLATELET
Basophils Absolute: 0.1 10*3/uL (ref 0.0–0.1)
Basophils Relative: 1 %
EOS ABS: 0.1 10*3/uL (ref 0.0–0.7)
EOS PCT: 2 %
HCT: 34.7 % — ABNORMAL LOW (ref 39.0–52.0)
Hemoglobin: 12.1 g/dL — ABNORMAL LOW (ref 13.0–17.0)
LYMPHS ABS: 1 10*3/uL (ref 0.7–4.0)
Lymphocytes Relative: 24 %
MCH: 35.8 pg — AB (ref 26.0–34.0)
MCHC: 34.9 g/dL (ref 30.0–36.0)
MCV: 102.7 fL — ABNORMAL HIGH (ref 78.0–100.0)
MONO ABS: 0.8 10*3/uL (ref 0.1–1.0)
MONOS PCT: 19 %
Neutro Abs: 2.1 10*3/uL (ref 1.7–7.7)
Neutrophils Relative %: 54 %
PLATELETS: 163 10*3/uL (ref 150–400)
RBC: 3.38 MIL/uL — ABNORMAL LOW (ref 4.22–5.81)
RDW: 14.1 % (ref 11.5–15.5)
WBC: 4 10*3/uL (ref 4.0–10.5)

## 2017-11-15 LAB — BASIC METABOLIC PANEL
Anion gap: 9 (ref 5–15)
BUN: 10 mg/dL (ref 6–20)
CHLORIDE: 108 mmol/L (ref 101–111)
CO2: 23 mmol/L (ref 22–32)
CREATININE: 0.66 mg/dL (ref 0.61–1.24)
Calcium: 8.9 mg/dL (ref 8.9–10.3)
GFR calc Af Amer: >60 mL/min (ref >60)
GFR calc non Af Amer: >60 mL/min (ref >60)
GLUCOSE: 116 mg/dL — AB (ref 65–99)
Potassium: 3.5 mmol/L (ref 3.5–5.1)
Sodium: 140 mmol/L (ref 135–145)

## 2017-11-15 LAB — GLUCOSE, CAPILLARY
GLUCOSE-CAPILLARY: 102 mg/dL — AB (ref 65–99)
GLUCOSE-CAPILLARY: 100 mg/dL — AB (ref 65–99)
Glucose-Capillary: 105 mg/dL — ABNORMAL HIGH (ref 65–99)
Glucose-Capillary: 106 mg/dL — ABNORMAL HIGH (ref 65–99)
Glucose-Capillary: 114 mg/dL — ABNORMAL HIGH (ref 65–99)
Glucose-Capillary: 123 mg/dL — ABNORMAL HIGH (ref 65–99)

## 2017-11-15 LAB — MAGNESIUM: MAGNESIUM: 1.5 mg/dL — AB (ref 1.7–2.4)

## 2017-11-15 MED ORDER — MAGNESIUM SULFATE 2 GM/50ML IV SOLN
2.0000 g | Freq: Once | INTRAVENOUS | Status: AC
  Administered 2017-11-15: 2 g via INTRAVENOUS
  Filled 2017-11-15: qty 50

## 2017-11-15 MED ORDER — POTASSIUM CHLORIDE CRYS ER 20 MEQ PO TBCR
20.0000 meq | EXTENDED_RELEASE_TABLET | Freq: Once | ORAL | Status: AC
  Administered 2017-11-15: 20 meq via ORAL
  Filled 2017-11-15: qty 1

## 2017-11-15 MED ORDER — HALOPERIDOL LACTATE 5 MG/ML IJ SOLN
5.0000 mg | Freq: Once | INTRAMUSCULAR | Status: AC
  Administered 2017-11-15: 5 mg via INTRAVENOUS
  Filled 2017-11-15: qty 1

## 2017-11-15 MED ORDER — MAGNESIUM SULFATE 2 GM/50ML IV SOLN: 2.0000 g | Freq: Once | INTRAVENOUS | Status: DC

## 2017-11-15 MED ORDER — ORAL CARE MOUTH RINSE
15.0000 mL | Freq: Two times a day (BID) | OROMUCOSAL | Status: DC
  Administered 2017-11-16 – 2017-11-21 (×3): 15 mL via OROMUCOSAL

## 2017-11-15 MED ORDER — VITAMIN B-1 100 MG PO TABS
100.0000 mg | ORAL_TABLET | Freq: Every day | ORAL | Status: DC
  Administered 2017-11-15 – 2017-11-21 (×7): 100 mg via ORAL
  Filled 2017-11-15 (×7): qty 1

## 2017-11-15 MED ORDER — PROSIGHT PO TABS
1.0000 | ORAL_TABLET | Freq: Every day | ORAL | Status: DC
  Administered 2017-11-15 – 2017-11-21 (×7): 1 via ORAL
  Filled 2017-11-15 (×7): qty 1

## 2017-11-15 NOTE — Progress Notes (Signed)
[place] Progress Note Patient Name: Joshua Henson DOB: [birth year] MRN: [id number]   Date of Service  11/15/2017  HPI/Events of Note  Moderately low potassium and hypomag  eICU Interventions  Potassium and mag replaced     Intervention Category Intermediate Interventions: Electrolyte abnormality - evaluation and management  Athene Schuhmacher 11/15/2017, 4:13 AM

## 2017-11-15 NOTE — Progress Notes (Addendum)
Patient ID: Joshua Henson, male   DOB: Nov 24, 1946, 71 y.o.   MRN: [id number]  PROGRESS NOTE    Joshua Henson  SUP:[id number] DOB: 29-Jun-1946 DOA: 11/09/2017  PCP: Mayra Neer, MD   Brief Narrative:   71 year old male with medical history significant for alcohol cirrhosis, coronary artery disease, anxiety and depression, hypertension, history of stroke and prostate cancer. Patient presented to ED with alcohol withdrawal. Patient reported his last drink was about 3 days prior to admission. Initial alcohol level was 118. UDS was positive for benzos and opiates. Blood work was notable for hypokalemia, anion gap of 21, lactic acid of 8.04, MCV 99.7. Chest x-ray was concerning for left basilar opacity suspicious for pneumonia. Critical care evaluated the patient for possible need for Precedex.  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  Objective: Vitals:   11/15/17 0200 11/15/17 0300 11/15/17 0400 11/15/17 0510  BP: (!) 142/90 (!) 149/85 (!) 155/79 138/84  Pulse: (!) 43  (!) 53 (!) 59  Resp: (!) _0 Temp:   98.1 F (36.7 C)   TempSrc:   Oral   SpO2: 98%  98% 98%  Weight:      Height:         Intake/Output Summary (Last 24 hours) at 11/15/2017 0536 Last data filed at 11/15/2017 0400 Gross per 24 hour  Intake 134.61 ml  Output 4700 ml  Net -4565.39 ml   Filed Weights   11/09/17 1630 11/13/17 0332 11/14/17 0400  Weight: 93.8 kg (206 lb 12.7 oz) 88.8 kg (195 lb 12.3 oz) 91.7 kg (202 lb 2.6 oz)    Examination:  General exam: Appears in no distress Respiratory system: Clear to auscultation. Respiratory effort normal. Cardiovascular system: S1 & S2 heard, tachycardic  Gastrointestinal system: Abdomen is nondistended, soft and nontender. No organomegaly or masses felt. Normal bowel sounds heard. Central nervous system: No focal neurological deficits. Extremities: Symmetric 5 x 5 power. Skin: No rashes, lesions or ulcers Psychiatry: Appears to be confused, disoriented   Data Reviewed: I have personally reviewed following labs and imaging studies  CBC: Recent Labs  Lab 11/09/17 1252 11/10/17 0247 11/11/17 0724 11/13/17 0549 11/15/17 0257  WBC 4.7 3.5* 7.3 4.3 4.0  NEUTROABS 3.2  --  5.6 2.8 2.1  HGB 13.0 12.4* 13.3 10.6* 12.1*  HCT 36.0* 35.5* 38.1* 30.8* 34.7*  MCV 99.7 101.1* 101.3* 103.0* 102.7*  PLT 154 128* 151 112* 592   Basic Metabolic Panel: Recent Labs  Lab 11/12/17 0311 11/12/17 1009 11/12/17 1712 11/12/17 2016 11/13/17 0549 11/13/17 2019 11/14/17 0357 11/15/17 0257  NA 137  --   --  137 137  --  138 140  K 2.9*  --   --  2.7* 3.3*  --  3.4* 3.5  CL 106  --   --  110 112*  --  113* 108  CO2 20*  --   --  20* 20*  --  19* 23  GLUCOSE 104*  --   --  128* 121*  --  122* 116*  BUN 16  --   --  15 12  --  8 10  CREATININE 0.66  --   --  0.65 0.60*  --  0.58* 0.66  CALCIUM 7.9*  --   --  7.6* 7.8*  --  8.1* 8.9  MG 2.3 1.9 1.8  --  1.8 1.9 1.9 1.5*  PHOS 1.8* 2.3* 1.8*  --  1.8* 1.9*  --   --    GFR: Estimated Creatinine Clearance: 97.8 mL/min (by C-G formula based on SCr of 0.66 mg/dL). Liver Function Tests: Recent Labs  Lab  11/09/17 1252 11/10/17 0247 11/11/17 0257 11/13/17 0549  AST 90* 70* 63* 46*  ALT _0 ALKPHOS 81 77 83 65  BILITOT 1.2 2.0* 1.9* 1.1  PROT 7.0 6.8 6.9 5.5*  ALBUMIN 3.8 3.7 3.7 2.4*   Recent Labs  Lab 11/09/17 1252  LIPASE 17   Recent Labs  Lab 11/11/17 0257 11/12/17 0311  AMMONIA 44* 38*   Coagulation Profile: No results for input(s): INR, PROTIME in the last 168 hours. Cardiac Enzymes: Recent Labs  Lab 11/11/17 0257  CKTOTAL 157   BNP (last 3 results) No results for input(s): PROBNP in the last 8760 hours. HbA1C: No results for input(s): HGBA1C in the last 72 hours. CBG: Recent Labs  Lab 11/14/17 0731 11/14/17 1114 11/14/17 1612 11/14/17 2016 11/14/17 2343  GLUCAP 120* 116* 116* 109* 129*   Lipid Profile: No results for input(s): CHOL, HDL, LDLCALC, TRIG, CHOLHDL, LDLDIRECT in the last 72 hours. Thyroid Function Tests: No results for input(s): TSH, T4TOTAL, FREET4, T3FREE, THYROIDAB in the last 72 hours. Anemia Panel: No results for input(s): VITAMINB12, FOLATE, FERRITIN, TIBC, IRON, RETICCTPCT in the last 72 hours. Urine analysis:    Component Value Date/Time   COLORURINE YELLOW 10/21/2016 [place] 10/21/2016 1835   LABSPEC 1.024 10/21/2016 1835   PHURINE 6.5 10/21/2016 1835   GLUCOSEU NEGATIVE 10/21/2016 1835   HGBUR NEGATIVE 10/21/2016 1835   BILIRUBINUR NEGATIVE 10/21/2016 1835   KETONESUR NEGATIVE 10/21/2016 1835   PROTEINUR NEGATIVE 10/21/2016 1835   UROBILINOGEN 2.0 (H) 04/02/2015 2013   NITRITE NEGATIVE 10/21/2016 1835   LEUKOCYTESUR NEGATIVE 10/21/2016 1835   Sepsis Labs: _1 (procalcitonin:4,lacticidven:4)   ) Recent Results (from the past 240 hour(s))  Culture, respiratory (NON-Expectorated)     Status: None   Collection Time: 11/11/17  1:29 PM  Result Value Ref Range Status   Specimen Description TRACHEAL ASPIRATE  Final   Special Requests NONE  Final   Gram Stain   Final    ABUNDANT WBC  PRESENT, PREDOMINANTLY PMN FEW SQUAMOUS EPITHELIAL CELLS PRESENT ABUNDANT GRAM POSITIVE COCCI IN PAIRS IN CHAINS IN CLUSTERS FEW GRAM NEGATIVE RODS FEW GRAM NEGATIVE COCCI IN PAIRS RARE GRAM POSITIVE RODS    Culture   Final    Consistent with normal respiratory flora. Performed at [facility], [place] [address], [place], [place] [address]    Report Status 11/13/2017 FINAL  Final  Culture, blood (routine x 2)     Status: None (Preliminary result)   Collection Time: 11/12/17  5:15 PM  Result Value Ref Range Status   Specimen Description BLOOD RIGHT ANTECUBITAL  Final   Special Requests   Final    BOTTLES DRAWN AEROBIC ONLY Blood Culture adequate volume   Culture   Final    NO GROWTH 1 DAY Performed at [facility], [place] [address], [place], [place] [address]    Report Status PENDING  Incomplete  Culture, blood (routine x 2)     Status: None (Preliminary result)   Collection Time: 11/12/17  5:20 PM  Result Value Ref Range Status   Specimen Description BLOOD BLOOD LEFT HAND  Final   Special Requests   Final    BOTTLES DRAWN AEROBIC ONLY Blood Culture adequate volume   Culture   Final    NO GROWTH 1 DAY Performed at [facility], [place] [address] [place], [place] [address]    Report Status PENDING  Incomplete  MRSA PCR Screening     Status: None   Collection Time: 11/13/17 10:39 AM  Result Value Ref Range Status   MRSA by PCR NEGATIVE NEGATIVE Final    Comment:        The GeneXpert MRSA Assay (FDA approved for NASAL specimens only), is one component of a comprehensive MRSA colonization surveillance program. It is not intended to diagnose MRSA infection nor to guide or monitor treatment for MRSA infections.       Radiology Studies: Dg Chest Port 1 View  Result Date: 11/13/2017 CLINICAL DATA:  Intubation. EXAM: PORTABLE CHEST 1 VIEW COMPARISON:  11/12/2017 FINDINGS: The endotracheal tube terminates near the inferior margin of the clavicular heads, well  above the carina. Enteric tube courses into the upper abdomen with tip not imaged. The cardiac silhouette is mildly enlarged. There is increasing airspace opacity in the left lung base. Right lung base opacity is stable to slightly improved and has a predominantly curvilinear configuration suggesting atelectasis. No sizable pleural effusion or pneumothorax is identified. IMPRESSION: 1. Increasing left basilar airspace opacity concerning for pneumonia. 2. Mild right basilar opacity suggestive of atelectasis. Electronically Signed   By: Logan Bores M.D.   On: 11/13/2017 06:50   Dg Chest Port 1 View  Result Date: 11/12/2017 CLINICAL DATA:  71 year old male with concern for aspiration pneumonia. EXAM: PORTABLE CHEST 1 VIEW COMPARISON:  11/11/2017. FINDINGS: Endotracheal tube terminates approximately 5.5 cm above the level of the carina. An enteric tube courses below the diaphragm beyond the edge of the film. There are low lung volumes with bronchovascular crowding. Cardiomediastinal silhouette is stable in size and configuration. Slightly increased opacity at the medial right lung base is consistent with aspiration pneumonia in the proper clinical setting. No sizable pleural effusions. No pneumothorax. No acute osseous abnormality. IMPRESSION: 1. Medial right lung opacity consistent with aspiration pneumonia in the proper clinical setting. 2. Life-support devices is stated. Electronically Signed   By: Kristopher Oppenheim M.D.   On: 11/12/2017 10:15   Dg Chest Port 1 View  Result Date: 11/11/2017 CLINICAL DATA:  71 year old male for evaluation of endotracheal and enteric tube placement. EXAM: PORTABLE CHEST 1 VIEW COMPARISON:  11/11/2017 FINDINGS: There has been interval placement of an endotracheal tube which terminates approximately 7.7 cm above the level of the carina. An enteric tube courses below the diaphragm beyond the edge of the film. Cardiomediastinal silhouette is partially visualized. Visualized  portions of the lung are clear. No acute osseous abnormalities. IMPRESSION: Interval intubation with endotracheal tube approximately 7.7 cm above the level of the carina. Consider advancing further and reimaging. Electronically Signed   By: Shayne Alken.D.  On: 11/11/2017 13:54   Dg Chest Port 1 View  Result Date: 11/11/2017 CLINICAL DATA:  Acute respiratory failure. EXAM: PORTABLE CHEST 1 VIEW COMPARISON:  Radiographs of November 09, 2017. FINDINGS: Stable cardiomediastinal silhouette. No pneumothorax or pleural effusion is noted. Both lungs are clear. The visualized skeletal structures are unremarkable. IMPRESSION: No acute cardiopulmonary abnormality seen. Electronically Signed   By: Marijo Conception, M.D.   On: 11/11/2017 10:50   Dg Abd Portable 1v  Result Date: 11/11/2017 CLINICAL DATA:  71 year old male for evaluation of enteric tube placement. EXAM: PORTABLE ABDOMEN - 1 VIEW COMPARISON:  None. FINDINGS: An enteric tube courses below the diaphragm and terminates within the gastric body. Visualized portions of bowel gas pattern are normal. IMPRESSION: Enteric tube courses below the diaphragm and terminates at the level of the gastric body. Electronically Signed   By: Kristopher Oppenheim M.D.   On: 11/11/2017 13:54        Scheduled Meds: . aspirin  162 mg Per Tube Daily  . chlorhexidine gluconate (MEDLINE KIT)  15 mL Mouth Rinse BID  . cloNIDine  0.2 mg Oral TID  . enoxaparin (LOVENOX) injection  40 mg Subcutaneous Q24H  . folic acid  1 mg Per Tube Daily  . furosemide  40 mg Intravenous Q12H  . hydrALAZINE  25 mg Per Tube Q8H  . insulin aspart  0-9 Units Subcutaneous Q4H  . lactulose  20 g Per Tube TID  . LORazepam  2 mg Oral Q8H  . mouth rinse  15 mL Mouth Rinse QID  . metoprolol tartrate  25 mg Per Tube BID  . pantoprazole (PROTONIX) IV  40 mg Intravenous Q12H  . tamsulosin  0.4 mg Oral Daily   Continuous Infusions: . cefTRIAXone (ROCEPHIN)  IV Stopped (11/14/17 1010)  .  magnesium sulfate 1 - 4 g bolus IVPB 2 g (11/15/17 0508)     LOS: 6 days    Time spent: 25 minutes  Greater than 50% of the time spent on counseling and coordinating the care.   Leisa Lenz, MD [facility] Pager [phone]  If 7PM-7AM, please contact night-coverage [email or web address] Password [email or web address] 11/15/2017, 5:36 AM

## 2017-11-15 NOTE — Progress Notes (Addendum)
Pt has made comments to RN about thoughts of suicide. Pt states that he, "no longer has a desire to live, and that he is done with this life." Pt also making comments to RN and charge RN to assist ending his life. RN & charge RN asked Pt if he has a plan/ intentions to harm himself;  Pt replied, "I don't have the balls to do it."  RN notified E-link MD regarding this event. At this time, Pt is resting and calm. RN will continue to monitor.

## 2017-11-16 LAB — BASIC METABOLIC PANEL
ANION GAP: 11 (ref 5–15)
BUN: 18 mg/dL (ref 6–20)
CO2: 24 mmol/L (ref 22–32)
Calcium: 9.3 mg/dL (ref 8.9–10.3)
Chloride: 104 mmol/L (ref 101–111)
Creatinine, Ser: 0.69 mg/dL (ref 0.61–1.24)
Glucose, Bld: 110 mg/dL — ABNORMAL HIGH (ref 65–99)
POTASSIUM: 3.3 mmol/L — AB (ref 3.5–5.1)
SODIUM: 139 mmol/L (ref 135–145)

## 2017-11-16 LAB — GLUCOSE, CAPILLARY
GLUCOSE-CAPILLARY: 103 mg/dL — AB (ref 65–99)
GLUCOSE-CAPILLARY: 111 mg/dL — AB (ref 65–99)
GLUCOSE-CAPILLARY: 121 mg/dL — AB (ref 65–99)
GLUCOSE-CAPILLARY: 117 mg/dL — AB (ref 65–99)
GLUCOSE-CAPILLARY: 120 mg/dL — AB (ref 65–99)
Glucose-Capillary: 145 mg/dL — ABNORMAL HIGH (ref 65–99)

## 2017-11-16 LAB — CBC
HCT: 35.2 % — ABNORMAL LOW (ref 39.0–52.0)
Hemoglobin: 12.1 g/dL — ABNORMAL LOW (ref 13.0–17.0)
MCH: 35.1 pg — ABNORMAL HIGH (ref 26.0–34.0)
MCHC: 34.4 g/dL (ref 30.0–36.0)
MCV: 102 fL — ABNORMAL HIGH (ref 78.0–100.0)
PLATELETS: 184 10*3/uL (ref 150–400)
RBC: 3.45 MIL/uL — AB (ref 4.22–5.81)
RDW: 14.1 % (ref 11.5–15.5)
WBC: 3.5 10*3/uL — AB (ref 4.0–10.5)

## 2017-11-16 LAB — MAGNESIUM: MAGNESIUM: 1.7 mg/dL (ref 1.7–2.4)

## 2017-11-16 MED ORDER — POTASSIUM CHLORIDE CRYS ER 20 MEQ PO TBCR
40.0000 meq | EXTENDED_RELEASE_TABLET | Freq: Once | ORAL | Status: AC
  Administered 2017-11-16: 40 meq via ORAL
  Filled 2017-11-16: qty 2

## 2017-11-16 MED ORDER — RESOURCE THICKENUP CLEAR PO POWD
ORAL | Status: DC | PRN
  Filled 2017-11-16 (×3): qty 125

## 2017-11-16 NOTE — Progress Notes (Signed)
Patient ID: Joshua Henson, male   DOB: 03-16-1946, 71 y.o.   MRN: [id number]  PROGRESS NOTE    Joshua Henson  FMB:[id number] DOB: 1946/07/19 DOA: 11/09/2017  PCP: Mayra Neer, MD   Brief Narrative:   71 year old male with medical history significant for alcohol cirrhosis, coronary artery disease, anxiety and depression, hypertension, history of stroke and prostate cancer. Patient presented to ED with alcohol withdrawal. Patient reported his last drink was about 3 days prior to admission. Initial alcohol level was 118. UDS was positive for benzos and opiates. Blood work was notable for hypokalemia, anion gap of 21, lactic acid of 8.04, MCV 99.7. Chest x-ray was concerning for left basilar opacity suspicious for pneumonia. Critical care evaluated the patient for possible need for Precedex.  Assessment & Plan:   Active Problems: Acute alcohol intoxication / alcohol withdrawal with delirium - Still confused - Continue CIWA protocol - Continue multivitamin, thiamine and folic acid   Left basilar pneumonia, unspecified organism - Continue Rocephin   Essential hypertension - Continue Klonopin, Lasix, hydralazine, metoprolol - Monitor blood pressure   Macrocytic anemia - Due to bone marrow suppression from alcohol abuse  - Hgb 12.1  Hypomagnesemia / hypokalemia - Due to alcohol abuse - Potassium 3.3 this am - Supplemented this am - Magnesium WNL   DVT prophylaxis: Lovenox subQ Code Status: full code  Family Communication: no family at the bedside Disposition Plan: continue to monitor for withdrawals in SDU   Consultants:   PCCM  Procedures:   None  Antimicrobials:   Rocephin 11/14/2017 -->    Subjective: No overnight events.  Objective: Vitals:   11/16/17 0300 11/16/17 0325 11/16/17 0400 11/16/17 0500  BP:   (!) 153/85   Pulse: 71  66 69  Resp: (!) 27  (!) 25 (!) 33  Temp:  98 F (36.7 C)    TempSrc:  Oral    SpO2: 96%  96% 94%  Weight:      Height:         Intake/Output Summary (Last 24 hours) at 11/16/2017 0609 Last data filed at 11/15/2017 1800 Gross per 24 hour  Intake -  Output 850 ml  Net -850 ml   Filed Weights   11/09/17 1630 11/13/17 0332 11/14/17 0400  Weight: 93.8 kg (206 lb 12.7 oz) 88.8 kg (195 lb 12.3 oz) 91.7 kg (202 lb 2.6 oz)    Physical Exam  Constitutional: sleeping, confused when wakes up CVS: RRR, S1/S2 + Pulmonary: Effort and breath sounds normal, no stridor, rhonchi, wheezes, rales.  Abdominal: Soft. BS +,  no distension, tenderness, rebound or guarding.  Musculoskeletal: No edema and no tenderness.  Lymphadenopathy: No lymphadenopathy noted, cervical, inguinal. Neuro: No focal deficits, disoriented  Skin: Skin is warm and dry.  Psychiatric: Not agitated or restless    Data Reviewed: I have personally reviewed following labs and imaging studies  CBC: Recent Labs  Lab 11/09/17 1252 11/10/17 0247 11/11/17 0724 11/13/17 0549 11/15/17 0257  WBC 4.7 3.5* 7.3 4.3 4.0  NEUTROABS 3.2  --  5.6 2.8 2.1  HGB 13.0 12.4* 13.3 10.6* 12.1*  HCT 36.0* 35.5* 38.1* 30.8* 34.7*  MCV 99.7 101.1* 101.3* 103.0* 102.7*  PLT 154 128* 151 112* 701   Basic Metabolic Panel: Recent Labs  Lab 11/12/17 0311 11/12/17 1009 11/12/17 1712 11/12/17 2016 11/13/17 0549 11/13/17 2019 11/14/17 0357 11/15/17 0257 11/16/17 0507  NA 137  --   --  137 137  --  138 140 139  K  2.9*  --   --  2.7* 3.3*  --  3.4* 3.5 3.3*  CL 106  --   --  110 112*  --  113* 108 104  CO2 20*  --   --  20* 20*  --  19* 23 24  GLUCOSE 104*  --   --  128* 121*  --  122* 116* 110*  BUN 16  --   --  15 12  --  8 10 18   CREATININE 0.66  --   --  0.65 0.60*  --  0.58* 0.66 0.69  CALCIUM 7.9*  --   --  7.6* 7.8*  --  8.1* 8.9 9.3  MG 2.3 1.9 1.8  --  1.8 1.9 1.9 1.5* 1.7  PHOS 1.8* 2.3* 1.8*  --  1.8* 1.9*  --   --   --    GFR: Estimated Creatinine Clearance: 97.8 mL/min (by C-G formula based on SCr of 0.69 mg/dL). Liver Function Tests: Recent  Labs  Lab 11/09/17 1252 11/10/17 0247 11/11/17 0257 11/13/17 0549  AST 90* 70* 63* 46*  ALT 30 27 24 18   ALKPHOS 81 77 83 65  BILITOT 1.2 2.0* 1.9* 1.1  PROT 7.0 6.8 6.9 5.5*  ALBUMIN 3.8 3.7 3.7 2.4*   Recent Labs  Lab 11/09/17 1252  LIPASE 17   Recent Labs  Lab 11/11/17 0257 11/12/17 0311  AMMONIA 44* 38*   Coagulation Profile: No results for input(s): INR, PROTIME in the last 168 hours. Cardiac Enzymes: Recent Labs  Lab 11/11/17 0257  CKTOTAL 157   BNP (last 3 results) No results for input(s): PROBNP in the last 8760 hours. HbA1C: No results for input(s): HGBA1C in the last 72 hours. CBG: Recent Labs  Lab 11/15/17 1210 11/15/17 1619 11/15/17 1923 11/15/17 2326 11/16/17 0324  GLUCAP 106* 114* 123* 100* 103*   Lipid Profile: No results for input(s): CHOL, HDL, LDLCALC, TRIG, CHOLHDL, LDLDIRECT in the last 72 hours. Thyroid Function Tests: No results for input(s): TSH, T4TOTAL, FREET4, T3FREE, THYROIDAB in the last 72 hours. Anemia Panel: No results for input(s): VITAMINB12, FOLATE, FERRITIN, TIBC, IRON, RETICCTPCT in the last 72 hours. Urine analysis:    Component Value Date/Time   COLORURINE YELLOW 10/21/2016 [place] 10/21/2016 1835   LABSPEC 1.024 10/21/2016 1835   PHURINE 6.5 10/21/2016 1835   GLUCOSEU NEGATIVE 10/21/2016 1835   HGBUR NEGATIVE 10/21/2016 [place] NEGATIVE 10/21/2016 1835   KETONESUR NEGATIVE 10/21/2016 1835   PROTEINUR NEGATIVE 10/21/2016 1835   UROBILINOGEN 2.0 (H) 04/02/2015 2013   NITRITE NEGATIVE 10/21/2016 1835   LEUKOCYTESUR NEGATIVE 10/21/2016 1835   Sepsis Labs: @LABRCNTIP (procalcitonin:4,lacticidven:4)   ) Recent Results (from the past 240 hour(s))  Culture, respiratory (NON-Expectorated)     Status: None   Collection Time: 11/11/17  1:29 PM  Result Value Ref Range Status   Specimen Description TRACHEAL ASPIRATE  Final   Special Requests NONE  Final   Gram Stain   Final     ABUNDANT WBC PRESENT, PREDOMINANTLY PMN FEW SQUAMOUS EPITHELIAL CELLS PRESENT ABUNDANT GRAM POSITIVE COCCI IN PAIRS IN CHAINS IN CLUSTERS FEW GRAM NEGATIVE RODS FEW GRAM NEGATIVE COCCI IN PAIRS RARE GRAM POSITIVE RODS    Culture   Final    Consistent with normal respiratory flora. Performed at [facility], [place] [address], [place], [place] [address]    Report Status 11/13/2017 FINAL  Final  Culture, blood (routine x 2)     Status: None (  Preliminary result)   Collection Time: 11/12/17  5:15 PM  Result Value Ref Range Status   Specimen Description BLOOD RIGHT ANTECUBITAL  Final   Special Requests   Final    BOTTLES DRAWN AEROBIC ONLY Blood Culture adequate volume   Culture   Final    NO GROWTH 2 DAYS Performed at [facility], [address] [place], [place] [address]    Report Status PENDING  Incomplete  Culture, blood (routine x 2)     Status: None (Preliminary result)   Collection Time: 11/12/17  5:20 PM  Result Value Ref Range Status   Specimen Description BLOOD BLOOD LEFT HAND  Final   Special Requests   Final    BOTTLES DRAWN AEROBIC ONLY Blood Culture adequate volume   Culture   Final    NO GROWTH 2 DAYS Performed at [facility], [place] [address], [place],  [address]    Report Status PENDING  Incomplete  MRSA PCR Screening     Status: None   Collection Time: 11/13/17 10:39 AM  Result Value Ref Range Status   MRSA by PCR NEGATIVE NEGATIVE Final    Comment:        The GeneXpert MRSA Assay (FDA approved for NASAL specimens only), is one component of a comprehensive MRSA colonization surveillance program. It is not intended to diagnose MRSA infection nor to guide or monitor treatment for MRSA infections.       Radiology Studies: Dg Chest Port 1 View  Result Date: 11/15/2017 CLINICAL DATA:  Acute respiratory acidosis EXAM: PORTABLE CHEST 1 VIEW COMPARISON:  11/13/2017 FINDINGS: Endotracheal tube and NG tube removed. Slight improvement  in bibasilar atelectasis. Small left effusion unchanged. Negative for edema. IMPRESSION: Endotracheal tube and NG tube removed. Mild improvement in bibasilar atelectasis. Small left effusion. Electronically Signed   By: Franchot Gallo M.D.   On: 11/15/2017 07:05   Dg Chest Port 1 View  Result Date: 11/13/2017 CLINICAL DATA:  Intubation. EXAM: PORTABLE CHEST 1 VIEW COMPARISON:  11/12/2017 FINDINGS: The endotracheal tube terminates near the inferior margin of the clavicular heads, well above the carina. Enteric tube courses into the upper abdomen with tip not imaged. The cardiac silhouette is mildly enlarged. There is increasing airspace opacity in the left lung base. Right lung base opacity is stable to slightly improved and has a predominantly curvilinear configuration suggesting atelectasis. No sizable pleural effusion or pneumothorax is identified. IMPRESSION: 1. Increasing left basilar airspace opacity concerning for pneumonia. 2. Mild right basilar opacity suggestive of atelectasis. Electronically Signed   By: Logan Bores M.D.   On: 11/13/2017 06:50   Dg Chest Port 1 View  Result Date: 11/12/2017 CLINICAL DATA:  71 year old male with concern for aspiration pneumonia. EXAM: PORTABLE CHEST 1 VIEW COMPARISON:  11/11/2017. FINDINGS: Endotracheal tube terminates approximately 5.5 cm above the level of the carina. An enteric tube courses below the diaphragm beyond the edge of the film. There are low lung volumes with bronchovascular crowding. Cardiomediastinal silhouette is stable in size and configuration. Slightly increased opacity at the medial right lung base is consistent with aspiration pneumonia in the proper clinical setting. No sizable pleural effusions. No pneumothorax. No acute osseous abnormality. IMPRESSION: 1. Medial right lung opacity consistent with aspiration pneumonia in the proper clinical setting. 2. Life-support devices is stated. Electronically Signed   By: Kristopher Oppenheim M.D.   On:  11/12/2017 10:15        Scheduled Meds: . aspirin  162 mg Per  Tube Daily  . cloNIDine  0.2 mg Oral TID  . enoxaparin (LOVENOX) injection  40 mg Subcutaneous Q24H  . folic acid  1 mg Per Tube Daily  . hydrALAZINE  25 mg Per Tube Q8H  . insulin aspart  0-9 Units Subcutaneous Q4H  . lactulose  20 g Per Tube TID  . LORazepam  2 mg Oral Q8H  . mouth rinse  15 mL Mouth Rinse BID  . metoprolol tartrate  25 mg Per Tube BID  . multivitamin  1 tablet Oral Daily  . pantoprazole (PROTONIX) IV  40 mg Intravenous Q12H  . tamsulosin  0.4 mg Oral Daily  . thiamine  100 mg Oral Daily   Continuous Infusions:    LOS: 7 days    Time spent: 25 minutes  Greater than 50% of the time spent on counseling and coordinating the care.   Leisa Lenz, MD [facility] Pager [phone]  If 7PM-7AM, please contact night-coverage [email or web address] Password [email or web address] 11/16/2017, 6:09 AM

## 2017-11-16 NOTE — Evaluation (Signed)
Clinical/Bedside Swallow Evaluation Patient Details  Name: Joshua Henson MRN: [id number] Date of Birth: 1946-04-17  Today's Date: 11/16/2017 Time: SLP Start Time (ACUTE ONLY): 1222 SLP Stop Time (ACUTE ONLY): 1259 SLP Time Calculation (min) (ACUTE ONLY): 37 min  Past Medical History:  Past Medical History:  Diagnosis Date  . Acute hepatic encephalopathy   . Alcoholic cirrhosis of liver without ascites ([place])   . Anxiety state   . CAD (coronary artery disease), native coronary artery 01/10/2016   3 vessel coronary calcification noted on CT scan 04/03/15    . Depression   . Fatty liver, alcoholic 9/62/9528  . Hypertension   . Increased ammonia level   . Insomnia   . NSTEMI (non-ST elevated myocardial infarction) ([place])   . Prostate cancer ([place]) 2010   External beam radiation (urol - Risa Grill, XRT Valere Dross)  . Stroke ([place])   . Tachycardia    Past Surgical History:  Past Surgical History:  Procedure Laterality Date  . CARDIAC CATHETERIZATION N/A 01/15/2016   Procedure: Left Heart Cath and Coronary Angiography;  Surgeon: Burnell Blanks, MD;  Location: [place];  Service: Cardiovascular;  Laterality: N/A;   HPI:  71 yo male admitted to [facility] with AMS, ETOH w/d on 11/09/17- required intubation 11/24-11-28.  Swallow evaluation ordered.  Pt known to this SLP from prior hospitalization and was diagnosed with mild oropharyngeal dysphagia with sensorimotor deficit with pharyngeal residuals pt did not sense.     Assessment / Plan / Recommendation Clinical Impression  Suspect pt with exacerbation of mild baseline oropharyngeal dysphagia (diagnosed 02/2017) due to reversible condition including AMS, deconditioning and s/p intubation.  Today pt with good tolerance of single ice chips, nectar thick liquids despite multiple swallows per bolus (up to 7 with cup bolus, 4-5 with tsp bolus nectar).  He does present with consistent cough post-swallow of thin - suspect due to decreased timing  of laryngeal closure.  Pt is sleepy but participative today.  Due to his lethargy and dysarthria, do not recommend solids at this time.  Recommend full liquid diet, nectar thick and allow single ice chips with strict precautions. Anticipate pt will tolerate dietary advancement with improved medical status.  Reviewed prior MBS study and reinitiated helpful compensation strategies.  Thanks for this order.  SLP Visit Diagnosis: Dysphagia, oropharyngeal phase (R13.12)    Aspiration Risk  Moderate aspiration risk    Diet Recommendation Nectar-thick liquid;Ice chips PRN after oral care(full liquid, nectar)   Liquid Administration via: Cup;Spoon Medication Administration: Whole meds with puree Supervision: Full supervision/cueing for compensatory strategies;Staff to assist with self feeding Compensations: Slow rate;Small sips/bites;Other (Comment)(follow thicker with thinner boluses, allow time for extra swallows) Postural Changes: Seated upright at 90 degrees;Remain upright for at least 30 minutes after po intake    Other  Recommendations Oral Care Recommendations: Oral care QID Other Recommendations: Order thickener from pharmacy   Follow up Recommendations        Frequency and Duration min 2x/week  1 week       Prognosis Prognosis for Safe Diet Advancement: Good      Swallow Study   General Date of Onset: 11/16/17 HPI: 71 yo male admitted to [facility] with AMS, ETOH w/d on 11/09/17- required intubation 11/24-11-28.  Swallow evaluation ordered.  Pt known to this SLP from prior hospitalization and was diagnosed with mild oropharyngeal dysphagia with sensorimotor deficit with pharyngeal residuals pt did not sense.   Type of Study: Bedside Swallow Evaluation Diet Prior to this  Study: NPO Temperature Spikes Noted: No Respiratory Status: Room air History of Recent Intubation: Yes Length of Intubations (days): 5 days Date extubated: 11/15/17 Behavior/Cognition: Alert;Confused Oral Cavity  Assessment: Within Functional Limits Oral Care Completed by SLP: No Oral Cavity - Dentition: Adequate natural dentition Vision: Functional for self-feeding Self-Feeding Abilities: Needs assist Patient Positioning: Upright in bed Baseline Vocal Quality: Normal Volitional Cough: Strong Volitional Swallow: Able to elicit    Oral/Motor/Sensory Function Overall Oral Motor/Sensory Function: Generalized oral weakness   Ice Chips Ice chips: Within functional limits Presentation: Spoon   Thin Liquid Thin Liquid: Impaired Presentation: Cup;Spoon Pharyngeal  Phase Impairments: Multiple swallows;Cough - Immediate    Nectar Thick Nectar Thick Liquid: Impaired Presentation: Cup;Spoon Pharyngeal Phase Impairments: Multiple swallows   Honey Thick Honey Thick Liquid: Not tested   Puree Puree: Impaired Presentation: Spoon Pharyngeal Phase Impairments: Multiple swallows   Solid   GO   Solid: Impaired Oral Phase Impairments: Impaired mastication Oral Phase Functional Implications: Impaired mastication Pharyngeal Phase Impairments: Multiple swallows        Joshua Henson 11/16/2017,1:11 PM   Luanna Salk, [facility] SLP [phone]

## 2017-11-16 NOTE — Progress Notes (Signed)
Pt had had difficulty swallowing thin liquids and lunch.  Took food and drink from pt, ordered speech eval, and informed Dr. Charlies Silvers.  Irven Baltimore, RN

## 2017-11-17 LAB — GLUCOSE, CAPILLARY
GLUCOSE-CAPILLARY: 110 mg/dL — AB (ref 65–99)
GLUCOSE-CAPILLARY: 270 mg/dL — AB (ref 65–99)
Glucose-Capillary: 104 mg/dL — ABNORMAL HIGH (ref 65–99)
Glucose-Capillary: 113 mg/dL — ABNORMAL HIGH (ref 65–99)
Glucose-Capillary: 109 mg/dL — ABNORMAL HIGH (ref 65–99)

## 2017-11-17 LAB — BASIC METABOLIC PANEL
ANION GAP: 11 (ref 5–15)
BUN: 19 mg/dL (ref 6–20)
CO2: 22 mmol/L (ref 22–32)
Calcium: 9.1 mg/dL (ref 8.9–10.3)
Chloride: 105 mmol/L (ref 101–111)
Creatinine, Ser: 0.73 mg/dL (ref 0.61–1.24)
Glucose, Bld: 115 mg/dL — ABNORMAL HIGH (ref 65–99)
POTASSIUM: 3.1 mmol/L — AB (ref 3.5–5.1)
SODIUM: 138 mmol/L (ref 135–145)

## 2017-11-17 LAB — CBC
HEMATOCRIT: 34.5 % — AB (ref 39.0–52.0)
HEMOGLOBIN: 11.9 g/dL — AB (ref 13.0–17.0)
MCH: 35.1 pg — ABNORMAL HIGH (ref 26.0–34.0)
MCHC: 34.5 g/dL (ref 30.0–36.0)
MCV: 101.8 fL — ABNORMAL HIGH (ref 78.0–100.0)
Platelets: 215 10*3/uL (ref 150–400)
RBC: 3.39 MIL/uL — ABNORMAL LOW (ref 4.22–5.81)
RDW: 13.9 % (ref 11.5–15.5)
WBC: 3.6 10*3/uL — AB (ref 4.0–10.5)

## 2017-11-17 LAB — MAGNESIUM: MAGNESIUM: 1.8 mg/dL (ref 1.7–2.4)

## 2017-11-17 MED ORDER — LACTULOSE 10 GM/15ML PO SOLN
20.0000 g | Freq: Three times a day (TID) | ORAL | Status: DC
  Administered 2017-11-17 – 2017-11-21 (×12): 20 g via ORAL
  Filled 2017-11-17 (×12): qty 30

## 2017-11-17 MED ORDER — TRAMADOL HCL 50 MG PO TABS
50.0000 mg | ORAL_TABLET | Freq: Four times a day (QID) | ORAL | Status: DC | PRN
  Administered 2017-11-18 (×2): 50 mg via ORAL
  Filled 2017-11-17 (×2): qty 1

## 2017-11-17 MED ORDER — POTASSIUM CHLORIDE CRYS ER 20 MEQ PO TBCR
40.0000 meq | EXTENDED_RELEASE_TABLET | Freq: Once | ORAL | Status: AC
  Administered 2017-11-17: 40 meq via ORAL
  Filled 2017-11-17: qty 2

## 2017-11-17 MED ORDER — PANTOPRAZOLE SODIUM 40 MG PO TBEC
40.0000 mg | DELAYED_RELEASE_TABLET | Freq: Two times a day (BID) | ORAL | Status: DC
  Administered 2017-11-17 – 2017-11-19 (×4): 40 mg via ORAL
  Filled 2017-11-17 (×4): qty 1

## 2017-11-17 MED ORDER — HYDRALAZINE HCL 25 MG PO TABS
25.0000 mg | ORAL_TABLET | Freq: Three times a day (TID) | ORAL | Status: DC
  Administered 2017-11-17 – 2017-11-21 (×12): 25 mg via ORAL
  Filled 2017-11-17 (×13): qty 1

## 2017-11-17 MED ORDER — ASPIRIN 81 MG PO CHEW
81.0000 mg | CHEWABLE_TABLET | Freq: Every day | ORAL | Status: DC
  Administered 2017-11-18 – 2017-11-21 (×4): 81 mg via ORAL
  Filled 2017-11-17 (×4): qty 1

## 2017-11-17 MED ORDER — METOPROLOL TARTRATE 25 MG PO TABS
25.0000 mg | ORAL_TABLET | Freq: Two times a day (BID) | ORAL | Status: DC
  Administered 2017-11-17 – 2017-11-19 (×5): 25 mg via ORAL
  Filled 2017-11-17 (×5): qty 1

## 2017-11-17 MED ORDER — PREMIER PROTEIN SHAKE
11.0000 [oz_av] | ORAL | Status: DC
  Administered 2017-11-18 – 2017-11-21 (×3): 11 [oz_av] via ORAL
  Filled 2017-11-17 (×5): qty 325.31

## 2017-11-17 NOTE — Progress Notes (Signed)
Patient arrived on unit via bed from ICU.  No family at bedside.  Telemetry placed per MD order and CMT notified.  

## 2017-11-17 NOTE — Progress Notes (Signed)
Patient continually trying to get out of bed, not following commands and unable to redirect.  Dr. Rogers Blocker notified for safety sitter.

## 2017-11-17 NOTE — Progress Notes (Signed)
Patient ID: Joshua Henson, male   DOB: June 14, 1946, 71 y.o.   MRN: [id number]  PROGRESS NOTE    Joshua Henson  BMW:[id number] DOB: 1946/05/31 DOA: 11/09/2017  PCP: No primary care provider on file.   Brief Narrative:   71 year old male with medical history significant for alcohol cirrhosis, coronary artery disease, anxiety and depression, hypertension, history of stroke and prostate cancer. Patient presented to ED with alcohol withdrawal. Patient reported his last drink was about 3 days prior to admission. Initial alcohol level was 118. UDS was positive for benzos and opiates. Blood work was notable for hypokalemia, anion gap of 21, lactic acid of 8.04, MCV 99.7. Chest x-ray was concerning for left basilar opacity suspicious for pneumonia. Critical care evaluated the patient for possible need for Precedex.  Assessment & Plan:   Active Problems: Acute alcohol intoxication / alcohol withdrawal with delirium - Continue CIWA protocol - Transfer to telemetry   Left basilar pneumonia, unspecified organism - Stopped rocephin 11/29 - Stable resp status - No fevers   Essential hypertension - Continue Klonopin, Lasix, hydralazine, metoprolol - Monitor on telemetry   Macrocytic anemia - Due to bone marrow suppression from alcohol abuse  - Hemoglobin stable   Hypomagnesemia / hypokalemia - Due to alcohol abuse - Continue to supplement potassium   DVT prophylaxis: Lovenox subQ Code Status: full code  Family Communication: No family at the bedside Disposition Plan: transfer to tele   Consultants:   PCCM  Procedures:   None  Antimicrobials:   Rocephin 11/14/2017 --> 11/16/2017   Subjective: No overnight events.  Objective: Vitals:   11/17/17 0500 11/17/17 0521 11/17/17 0600 11/17/17 0800  BP:  (!) 167/104 (!) 165/88   Pulse: 79 83 74   Resp: 14 17 (!) 29   Temp:    99 F (37.2 C)  TempSrc:    Oral  SpO2: 96% 96% 93%   Weight:      Height:       No intake or  output data in the 24 hours ending 11/17/17 0826 Filed Weights   11/09/17 1630 11/13/17 0332 11/14/17 0400  Weight: 93.8 kg (206 lb 12.7 oz) 88.8 kg (195 lb 12.3 oz) 91.7 kg (202 lb 2.6 oz)    Physical Exam  Constitutional: Sleeping, opens eyes briefly to verbal stimuli CVS: Rate controlled, S1/S2 + Pulmonary: no wheezing, no rhonchi  Abdominal: Soft. BS +,  no distension, tenderness, rebound or guarding.  Musculoskeletal: No edema and no tenderness.  Lymphadenopathy: No lymphadenopathy noted, cervical, inguinal. Neuro: No focal deficits  Skin: Skin is warm and dry. No rash noted.  Psychiatric: Not restless or agitated    Data Reviewed: I have personally reviewed following labs and imaging studies  CBC: Recent Labs  Lab 11/11/17 0724 11/13/17 0549 11/15/17 0257 11/16/17 0507 11/17/17 0321  WBC 7.3 4.3 4.0 3.5* 3.6*  NEUTROABS 5.6 2.8 2.1  --   --   HGB 13.3 10.6* 12.1* 12.1* 11.9*  HCT 38.1* 30.8* 34.7* 35.2* 34.5*  MCV 101.3* 103.0* 102.7* 102.0* 101.8*  PLT 151 112* 163 184 272   Basic Metabolic Panel: Recent Labs  Lab 11/12/17 0311 11/12/17 1009 11/12/17 1712  11/13/17 0549 11/13/17 2019 11/14/17 0357 11/15/17 0257 11/16/17 0507 11/17/17 0321  NA 137  --   --    < > 137  --  138 140 139 138  K 2.9*  --   --    < > 3.3*  --  3.4* 3.5 3.3* 3.1*  CL 106  --   --    < > 112*  --  113* 108 104 105  CO2 20*  --   --    < > 20*  --  19* 23 24 22   GLUCOSE 104*  --   --    < > 121*  --  122* 116* 110* 115*  BUN 16  --   --    < > 12  --  8 10 18 19   CREATININE 0.66  --   --    < > 0.60*  --  0.58* 0.66 0.69 0.73  CALCIUM 7.9*  --   --    < > 7.8*  --  8.1* 8.9 9.3 9.1  MG 2.3 1.9 1.8  --  1.8 1.9 1.9 1.5* 1.7 1.8  PHOS 1.8* 2.3* 1.8*  --  1.8* 1.9*  --   --   --   --    < > = values in this interval not displayed.   GFR: Estimated Creatinine Clearance: 97.8 mL/min (by C-G formula based on SCr of 0.73 mg/dL). Liver Function Tests: Recent Labs  Lab  11/11/17 0257 11/13/17 0549  AST 63* 46*  ALT 24 18  ALKPHOS 83 65  BILITOT 1.9* 1.1  PROT 6.9 5.5*  ALBUMIN 3.7 2.4*   No results for input(s): LIPASE, AMYLASE in the last 168 hours. Recent Labs  Lab 11/11/17 0257 11/12/17 0311  AMMONIA 44* 38*   Coagulation Profile: No results for input(s): INR, PROTIME in the last 168 hours. Cardiac Enzymes: Recent Labs  Lab 11/11/17 0257  CKTOTAL 157   BNP (last 3 results) No results for input(s): PROBNP in the last 8760 hours. HbA1C: No results for input(s): HGBA1C in the last 72 hours. CBG: Recent Labs  Lab 11/16/17 1205 11/16/17 1550 11/16/17 1959 11/16/17 2242 11/17/17 0326  GLUCAP 121* 117* 145* 120* 110*   Lipid Profile: No results for input(s): CHOL, HDL, LDLCALC, TRIG, CHOLHDL, LDLDIRECT in the last 72 hours. Thyroid Function Tests: No results for input(s): TSH, T4TOTAL, FREET4, T3FREE, THYROIDAB in the last 72 hours. Anemia Panel: No results for input(s): VITAMINB12, FOLATE, FERRITIN, TIBC, IRON, RETICCTPCT in the last 72 hours. Urine analysis:    Component Value Date/Time   COLORURINE YELLOW 10/21/2016 [place] 10/21/2016 1835   LABSPEC 1.024 10/21/2016 1835   PHURINE 6.5 10/21/2016 1835   GLUCOSEU NEGATIVE 10/21/2016 1835   HGBUR NEGATIVE 10/21/2016 [place] NEGATIVE 10/21/2016 1835   KETONESUR NEGATIVE 10/21/2016 1835   PROTEINUR NEGATIVE 10/21/2016 1835   UROBILINOGEN 2.0 (H) 04/02/2015 2013   NITRITE NEGATIVE 10/21/2016 1835   LEUKOCYTESUR NEGATIVE 10/21/2016 1835   Sepsis Labs: @LABRCNTIP (procalcitonin:4,lacticidven:4)   ) Recent Results (from the past 240 hour(s))  Culture, respiratory (NON-Expectorated)     Status: None   Collection Time: 11/11/17  1:29 PM  Result Value Ref Range Status   Specimen Description TRACHEAL ASPIRATE  Final   Special Requests NONE  Final   Gram Stain   Final    ABUNDANT WBC PRESENT, PREDOMINANTLY PMN FEW SQUAMOUS EPITHELIAL CELLS  PRESENT ABUNDANT GRAM POSITIVE COCCI IN PAIRS IN CHAINS IN CLUSTERS FEW GRAM NEGATIVE RODS FEW GRAM NEGATIVE COCCI IN PAIRS RARE GRAM POSITIVE RODS    Culture   Final    Consistent with normal respiratory flora. Performed at [facility], [place] [address] [place], [place] [address]    Report Status 11/13/2017 FINAL  Final  Culture, blood (routine x  2)     Status: None (Preliminary result)   Collection Time: 11/12/17  5:15 PM  Result Value Ref Range Status   Specimen Description BLOOD RIGHT ANTECUBITAL  Final   Special Requests   Final    BOTTLES DRAWN AEROBIC ONLY Blood Culture adequate volume   Culture   Final    NO GROWTH 3 DAYS Performed at [facility], [address], [place], [place] [address]    Report Status PENDING  Incomplete  Culture, blood (routine x 2)     Status: None (Preliminary result)   Collection Time: 11/12/17  5:20 PM  Result Value Ref Range Status   Specimen Description BLOOD BLOOD LEFT HAND  Final   Special Requests   Final    BOTTLES DRAWN AEROBIC ONLY Blood Culture adequate volume   Culture   Final    NO GROWTH 3 DAYS Performed at [facility], [place] [address] [place], [place] [address]    Report Status PENDING  Incomplete  MRSA PCR Screening     Status: None   Collection Time: 11/13/17 10:39 AM  Result Value Ref Range Status   MRSA by PCR NEGATIVE NEGATIVE Final    Comment:        The GeneXpert MRSA Assay (FDA approved for NASAL specimens only), is one component of a comprehensive MRSA colonization surveillance program. It is not intended to diagnose MRSA infection nor to guide or monitor treatment for MRSA infections.       Radiology Studies: Dg Chest Port 1 View  Result Date: 11/15/2017 CLINICAL DATA:  Acute respiratory acidosis EXAM: PORTABLE CHEST 1 VIEW COMPARISON:  11/13/2017 FINDINGS: Endotracheal tube and NG tube removed. Slight improvement in bibasilar atelectasis. Small left effusion unchanged. Negative for  edema. IMPRESSION: Endotracheal tube and NG tube removed. Mild improvement in bibasilar atelectasis. Small left effusion. Electronically Signed   By: Franchot Gallo M.D.   On: 11/15/2017 07:05        Scheduled Meds: . aspirin  162 mg Per Tube Daily  . cloNIDine  0.2 mg Oral TID  . enoxaparin (LOVENOX) injection  40 mg Subcutaneous Q24H  . folic acid  1 mg Per Tube Daily  . hydrALAZINE  25 mg Per Tube Q8H  . insulin aspart  0-9 Units Subcutaneous Q4H  . lactulose  20 g Per Tube TID  . LORazepam  2 mg Oral Q8H  . mouth rinse  15 mL Mouth Rinse BID  . metoprolol tartrate  25 mg Per Tube BID  . multivitamin  1 tablet Oral Daily  . pantoprazole (PROTONIX) IV  40 mg Intravenous Q12H  . tamsulosin  0.4 mg Oral Daily  . thiamine  100 mg Oral Daily   Continuous Infusions:    LOS: 8 days    Time spent: 25 minutes  Greater than 50% of the time spent on counseling and coordinating the care.   Leisa Lenz, MD [facility] Pager [phone]  If 7PM-7AM, please contact night-coverage [email or web address] Password [email or web address] 11/17/2017, 8:26 AM

## 2017-11-17 NOTE — Progress Notes (Signed)
Nutrition Follow-up  DOCUMENTATION CODES:   Not applicable  INTERVENTION:  - Will order Premier Protein once/day, this supplement provides 160 kcal and 30 grams of protein--needs to be mixed with ThickenUp. - Encourage PO intakes of meals and supplement. - Continue to advance diet as medically feasible.   NUTRITION DIAGNOSIS:   Inadequate oral intake related to inability to eat as evidenced by NPO status. -diet advanced to FLD yesterday with no intakes.   GOAL:   Patient will meet greater than or equal to 90% of their needs -unmet  MONITOR:   PO intake, Supplement acceptance, Diet advancement, Weight trends, Labs  ASSESSMENT:   71 year old male who presented to [facility] with reports of alcohol withdrawal. On admission the patient reported his last drink was 2-3 days prior to presentation.  He reported pain and tremors.  Initial alcohol level was 118.  UDS was positive for benzos and opiates.   11/30 Pt diet advanced from NPO to FLD, nectar-thick yesterday at 1:00 PM [employer] order in place). Pt denies having anything to eat or drink yesterday or this AM. RN, who is at bedside, reports that she recently ordered breakfast for pt. Pt denies abdominal pain or nausea at this time. He plans to try to consume breakfast and is aware that there is concern for him aspirating. Per chart review, pt gained 7 lbs/2.9 kg from 11/26-11/27 and no new weight since 11/27. Will continue to monitor weight trends closely.   Per Dr. Fortino Sic note this AM, plan to transfer to telemetry when a bed is available. He has L-sided PNA, hypokalemia d/t alcohol abuse and pt is on CIWA protocol.   Medications reviewed; 1 mg oral folic acid/day, sliding scale Novolog, 20 g lactulose TID, 1 tablet Prosight/day, 40 mg IV Protonix BID, 40 mEq oral KCl x1 dose today, 100 mg oral thiamine/day.  Labs reviewed; K: 3.1 mmol/L.     11/27 - Pt was extubated earlier this AM and TF was placed  on hold prior to that event.  - He was previously receiving Vital 1.5 @ 30 mL/hr with 60 mL Prostat TID. - This regimen provided 1680 kcal, 139 grams of protein, and 550 mL free water.  - Estimated nutrition needs updated based on extubation and based on current weight.  - Dr. Janit Pagan note from this AM states pt is at risk for aspiration, has fatty liver, has hepatic encephalopathy, and possible stress gastritis.    Medications; 2 g IV Mg sulfate x1 run yesterday, 40 mEq KCl per OGT x2 doses today, 20 mEq IV KPhos x1 run yesterday Labs; K: 3.4 mmol/L      11/25 - Pt's reported alcohol intake is half a fifth daily.  - Currently going through withdrawal.  - Weight has increased since earlier this year.  Patient is currently intubated on ventilator support MV:11.5L/min Temp (24hrs), Avg:100 F (37.8 C), Min:98.4 F (36.9 C), Max:101.2 F (38.4 C) Propofol:19.64ml/hr -provides 520 kcal    Diet Order:  Diet full liquid Room service appropriate? Yes; Fluid consistency: Nectar Thick  EDUCATION NEEDS:   No education needs have been identified at this time  Skin:  Skin Assessment: Reviewed RN Assessment  Last BM:  11/28  Height:   Ht Readings from Last 1 Encounters:  11/13/17 5\' 10"  (1.778 m)    Weight:   Wt Readings from Last 1 Encounters:  11/14/17 202 lb 2.6 oz (91.7 kg)    Ideal Body Weight:  75.5 kg  BMI:  Body mass index is 29.01 kg/m.  Estimated Nutritional Needs:   Kcal:  1650-2020 (18-22 kcal/kg)  Protein:  75-90 grams  Fluid:  >/= 1.8 L/day     Jarome Matin, MS, RD, LDN, [facility] Inpatient Clinical Dietitian Pager # [phone] After hours/weekend pager # [phone]

## 2017-11-17 NOTE — Progress Notes (Signed)
SLP Cancellation Note  Patient Details Name: Joshua Henson MRN: [id number] DOB: [birth year]   Cancelled treatment:       Reason Eval/Treat Not Completed: Other (comment)(pt with agitation today per RN- pt fluctuates between lethargy or agitation; recommend continue diet with strict aspiration precautions, will continue efforts.      Luanna Salk, [facility] SLP [place], [place] 11/17/2017, 2:22 PM

## 2017-11-18 LAB — MAGNESIUM: Magnesium: 2.1 mg/dL (ref 1.7–2.4)

## 2017-11-18 LAB — CBC
HEMATOCRIT: 34.8 % — AB (ref 39.0–52.0)
Hemoglobin: 12 g/dL — ABNORMAL LOW (ref 13.0–17.0)
MCH: 35.3 pg — AB (ref 26.0–34.0)
MCHC: 34.5 g/dL (ref 30.0–36.0)
MCV: 102.4 fL — AB (ref 78.0–100.0)
PLATELETS: 228 10*3/uL (ref 150–400)
RBC: 3.4 MIL/uL — AB (ref 4.22–5.81)
RDW: 14.1 % (ref 11.5–15.5)
WBC: 3.1 10*3/uL — ABNORMAL LOW (ref 4.0–10.5)

## 2017-11-18 LAB — BASIC METABOLIC PANEL
Anion gap: 8 (ref 5–15)
BUN: 15 mg/dL (ref 6–20)
CHLORIDE: 106 mmol/L (ref 101–111)
CO2: 24 mmol/L (ref 22–32)
CREATININE: 0.69 mg/dL (ref 0.61–1.24)
Calcium: 9.1 mg/dL (ref 8.9–10.3)
GFR calc non Af Amer: >60 mL/min (ref >60)
Glucose, Bld: 101 mg/dL — ABNORMAL HIGH (ref 65–99)
POTASSIUM: 2.9 mmol/L — AB (ref 3.5–5.1)
Sodium: 138 mmol/L (ref 135–145)

## 2017-11-18 LAB — GLUCOSE, CAPILLARY
GLUCOSE-CAPILLARY: 117 mg/dL — AB (ref 65–99)
Glucose-Capillary: 111 mg/dL — ABNORMAL HIGH (ref 65–99)
Glucose-Capillary: 119 mg/dL — ABNORMAL HIGH (ref 65–99)
Glucose-Capillary: 125 mg/dL — ABNORMAL HIGH (ref 65–99)
Glucose-Capillary: 134 mg/dL — ABNORMAL HIGH (ref 65–99)
Glucose-Capillary: 137 mg/dL — ABNORMAL HIGH (ref 65–99)

## 2017-11-18 LAB — CULTURE, BLOOD (ROUTINE X 2)
CULTURE: NO GROWTH
Culture: NO GROWTH
SPECIAL REQUESTS: ADEQUATE
Special Requests: ADEQUATE

## 2017-11-18 MED ORDER — CLONIDINE HCL 0.1 MG PO TABS
0.2000 mg | ORAL_TABLET | Freq: Every day | ORAL | Status: DC
  Administered 2017-11-18 – 2017-11-20 (×3): 0.2 mg via ORAL
  Filled 2017-11-18 (×3): qty 2

## 2017-11-18 MED ORDER — LORAZEPAM 2 MG/ML IJ SOLN
2.0000 mg | Freq: Once | INTRAMUSCULAR | Status: AC
  Administered 2017-11-18: 2 mg via INTRAVENOUS
  Filled 2017-11-18: qty 1

## 2017-11-18 MED ORDER — POTASSIUM CHLORIDE CRYS ER 20 MEQ PO TBCR
40.0000 meq | EXTENDED_RELEASE_TABLET | Freq: Once | ORAL | Status: AC
  Administered 2017-11-18: 40 meq via ORAL
  Filled 2017-11-18: qty 2

## 2017-11-18 MED ORDER — OXYCODONE-ACETAMINOPHEN 5-325 MG PO TABS
1.0000 | ORAL_TABLET | Freq: Four times a day (QID) | ORAL | Status: DC | PRN
Start: 2017-11-18 — End: 2017-11-21
  Administered 2017-11-18 – 2017-11-21 (×11): 1 via ORAL
  Filled 2017-11-18 (×11): qty 1

## 2017-11-18 NOTE — Progress Notes (Signed)
Patient ID: Joshua Henson, male   DOB: [birth year], 71 y.o.   MRN: [id number]  PROGRESS NOTE    Joshua Henson  NWG:[id number] DOB: 26-May-1946 DOA: 11/09/2017  PCP: No primary care provider on file.   Brief Narrative:   71 year old male with medical history significant for alcohol cirrhosis, coronary artery disease, anxiety and depression, hypertension, history of stroke and prostate cancer. Patient presented to ED with alcohol withdrawal. Patient reported his last drink was about 3 days prior to admission. Initial alcohol level was 118. UDS was positive for benzos and opiates. Blood work was notable for hypokalemia, anion gap of 21, lactic acid of 8.04, MCV 99.7. Chest x-ray was concerning for left basilar opacity suspicious for pneumonia. Critical care evaluated the patient for possible need for Precedex.  Transferred to telemetry floor 11/17/2017.  Assessment & Plan:   Active Problems: Acute alcohol intoxication / alcohol withdrawal with delirium - Continue CIWA protocol - Patient is more alert today - Continue diet as tolerated - Continue multivitamin, thiamine and folic acid  Left basilar pneumonia, unspecified organism - Patient was briefly on Rocephin. He is respiratory status is stable. No fevers.  Essential hypertension - Blood pressure is 122/67 - We will try to decrease slowly Klonopin. Patient is currently on 0.2 mg clonidine 3 times daily but will reduce it to once a day regimen and hopefully his blood pressure improves further he can be off of clonidine. This is not a good medication for him considering alcohol abuse and high risk of noncompliance and rebound hypertension - Continue metoprolol - Continue hydralazine  Macrocytic anemia - Due to bone marrow suppression from alcohol abuse  - Hgb stable   Hypomagnesemia / hypokalemia - Due to alcohol abuse - Continue to supplement - Follow up BMP in am   DVT prophylaxis: SCD's Code Status: full code  Family  Communication: sitter at bedside, was little restless yesterday, getting up from bed and due to risk of fall order placed for sitter at bedside. May discontinue once his mental status back to baseline. He is little better this am but still has tremors and not fully oriented  Disposition Plan: not yet stable for discharge     Consultants:   PCCM  Procedures:   None  Antimicrobials:   Rocephin 11/14/2017 --> 11/16/2017   Subjective: Restless yesterday.   Objective: Vitals:   11/17/17 1434 11/17/17 1537 11/17/17 2152 11/18/17 0550  BP: 106/61 118/74 (!) 167/102 122/67  Pulse:  72 69 77  Resp: (!) 25  (!) 28 20  Temp:  98.9 F (37.2 C) 97.6 F (36.4 C) 97.7 F (36.5 C)  TempSrc:  Oral Oral Oral  SpO2: 100% 95% 99% 97%  Weight:  87.5 kg (192 lb 14.4 oz)    Height:  5\' 7"  (1.702 m)      Intake/Output Summary (Last 24 hours) at 11/18/2017 0911 Last data filed at 11/18/2017 0750 Gross per 24 hour  Intake 560 ml  Output -  Net 560 ml   Filed Weights   11/13/17 0332 11/14/17 0400 11/17/17 1537  Weight: 88.8 kg (195 lb 12.3 oz) 91.7 kg (202 lb 2.6 oz) 87.5 kg (192 lb 14.4 oz)    Physical Exam  Constitutional: Appears well-developed and well-nourished. No distress.  CVS: RRR, S1/S2 + Pulmonary: Effort and breath sounds normal, no stridor, rhonchi, wheezes, rales.  Abdominal: Soft. BS +,  no distension, tenderness, rebound or guarding.  Musculoskeletal: Normal range of motion. No edema and no tenderness.  Lymphadenopathy: No lymphadenopathy noted, cervical, inguinal. Neuro: Disoriented somewhat but more alert than yesterday  Skin: Skin is warm and dry.  Psychiatric: Normal mood and affect.     Data Reviewed: I have personally reviewed following labs and imaging studies  CBC: Recent Labs  Lab 11/13/17 0549 11/15/17 0257 11/16/17 0507 11/17/17 0321 11/18/17 0621  WBC 4.3 4.0 3.5* 3.6* 3.1*  NEUTROABS 2.8 2.1  --   --   --   HGB 10.6* 12.1* 12.1* 11.9* 12.0*    HCT 30.8* 34.7* 35.2* 34.5* 34.8*  MCV 103.0* 102.7* 102.0* 101.8* 102.4*  PLT 112* 163 184 215 440   Basic Metabolic Panel: Recent Labs  Lab 11/12/17 0311 11/12/17 1009 11/12/17 1712  11/13/17 0549 11/13/17 2019 11/14/17 0357 11/15/17 0257 11/16/17 0507 11/17/17 0321 11/18/17 0621  NA 137  --   --    < > 137  --  138 140 139 138 138  K 2.9*  --   --    < > 3.3*  --  3.4* 3.5 3.3* 3.1* 2.9*  CL 106  --   --    < > 112*  --  113* 108 104 105 106  CO2 20*  --   --    < > 20*  --  19* 23 24 22 24   GLUCOSE 104*  --   --    < > 121*  --  122* 116* 110* 115* 101*  BUN 16  --   --    < > 12  --  8 10 18 19 15   CREATININE 0.66  --   --    < > 0.60*  --  0.58* 0.66 0.69 0.73 0.69  CALCIUM 7.9*  --   --    < > 7.8*  --  8.1* 8.9 9.3 9.1 9.1  MG 2.3 1.9 1.8  --  1.8 1.9 1.9 1.5* 1.7 1.8 2.1  PHOS 1.8* 2.3* 1.8*  --  1.8* 1.9*  --   --   --   --   --    < > = values in this interval not displayed.   GFR: Estimated Creatinine Clearance: 90.8 mL/min (by C-G formula based on SCr of 0.69 mg/dL). Liver Function Tests: Recent Labs  Lab 11/13/17 0549  AST 46*  ALT 18  ALKPHOS 65  BILITOT 1.1  PROT 5.5*  ALBUMIN 2.4*   No results for input(s): LIPASE, AMYLASE in the last 168 hours. Recent Labs  Lab 11/12/17 0311  AMMONIA 38*   Coagulation Profile: No results for input(s): INR, PROTIME in the last 168 hours. Cardiac Enzymes: No results for input(s): CKTOTAL, CKMB, CKMBINDEX, TROPONINI in the last 168 hours. BNP (last 3 results) No results for input(s): PROBNP in the last 8760 hours. HbA1C: No results for input(s): HGBA1C in the last 72 hours. CBG: Recent Labs  Lab 11/17/17 1635 11/17/17 2138 11/18/17 0017 11/18/17 0455 11/18/17 0735  GLUCAP 109* 270* 137* 134* 119*   Lipid Profile: No results for input(s): CHOL, HDL, LDLCALC, TRIG, CHOLHDL, LDLDIRECT in the last 72 hours. Thyroid Function Tests: No results for input(s): TSH, T4TOTAL, FREET4, T3FREE, THYROIDAB in the  last 72 hours. Anemia Panel: No results for input(s): VITAMINB12, FOLATE, FERRITIN, TIBC, IRON, RETICCTPCT in the last 72 hours. Urine analysis:    Component Value Date/Time   COLORURINE YELLOW 10/21/2016 [place] 10/21/2016 1835   LABSPEC 1.024 10/21/2016 1835   PHURINE 6.5 10/21/2016 [place] NEGATIVE 10/21/2016 1835  HGBUR NEGATIVE 10/21/2016 [place] 10/21/2016 [place] 10/21/2016 1835   PROTEINUR NEGATIVE 10/21/2016 1835   UROBILINOGEN 2.0 (H) 04/02/2015 2013   NITRITE NEGATIVE 10/21/2016 1835   LEUKOCYTESUR NEGATIVE 10/21/2016 1835   Sepsis Labs: @LABRCNTIP (procalcitonin:4,lacticidven:4)   ) Recent Results (from the past 240 hour(s))  Culture, respiratory (NON-Expectorated)     Status: None   Collection Time: 11/11/17  1:29 PM  Result Value Ref Range Status   Specimen Description TRACHEAL ASPIRATE  Final   Special Requests NONE  Final   Gram Stain   Final    ABUNDANT WBC PRESENT, PREDOMINANTLY PMN FEW SQUAMOUS EPITHELIAL CELLS PRESENT ABUNDANT GRAM POSITIVE COCCI IN PAIRS IN CHAINS IN CLUSTERS FEW GRAM NEGATIVE RODS FEW GRAM NEGATIVE COCCI IN PAIRS RARE GRAM POSITIVE RODS    Culture   Final    Consistent with normal respiratory flora. Performed at [facility], [place] [address] [place], [place] [address]    Report Status 11/13/2017 FINAL  Final  Culture, blood (routine x 2)     Status: None (Preliminary result)   Collection Time: 11/12/17  5:15 PM  Result Value Ref Range Status   Specimen Description BLOOD RIGHT ANTECUBITAL  Final   Special Requests   Final    BOTTLES DRAWN AEROBIC ONLY Blood Culture adequate volume   Culture   Final    NO GROWTH 4 DAYS Performed at [facility], [place] [address] [place], [place] [address]    Report Status PENDING  Incomplete  Culture, blood (routine x 2)     Status: None (Preliminary result)   Collection Time: 11/12/17  5:20 PM  Result Value Ref Range  Status   Specimen Description BLOOD BLOOD LEFT HAND  Final   Special Requests   Final    BOTTLES DRAWN AEROBIC ONLY Blood Culture adequate volume   Culture   Final    NO GROWTH 4 DAYS Performed at [facility], [place] [address], [place],  [address]    Report Status PENDING  Incomplete  MRSA PCR Screening     Status: None   Collection Time: 11/13/17 10:39 AM  Result Value Ref Range Status   MRSA by PCR NEGATIVE NEGATIVE Final    Comment:        The GeneXpert MRSA Assay (FDA approved for NASAL specimens only), is one component of a comprehensive MRSA colonization surveillance program. It is not intended to diagnose MRSA infection nor to guide or monitor treatment for MRSA infections.       Radiology Studies: Dg Chest Port 1 View  Result Date: 11/15/2017 CLINICAL DATA:  Acute respiratory acidosis EXAM: PORTABLE CHEST 1 VIEW COMPARISON:  11/13/2017 FINDINGS: Endotracheal tube and NG tube removed. Slight improvement in bibasilar atelectasis. Small left effusion unchanged. Negative for edema. IMPRESSION: Endotracheal tube and NG tube removed. Mild improvement in bibasilar atelectasis. Small left effusion. Electronically Signed   By: Franchot Gallo M.D.   On: 11/15/2017 07:05        Scheduled Meds: . aspirin  81 mg Oral Daily  . cloNIDine  0.2 mg Oral TID  . enoxaparin (LOVENOX) injection  40 mg Subcutaneous Q24H  . folic acid  1 mg Per Tube Daily  . hydrALAZINE  25 mg Oral Q8H  . insulin aspart  0-9 Units Subcutaneous Q4H  . lactulose  20 g Oral TID  . LORazepam  2 mg Oral Q8H  . mouth rinse  15 mL Mouth Rinse  BID  . metoprolol tartrate  25 mg Oral BID  . multivitamin  1 tablet Oral Daily  . pantoprazole  40 mg Oral BID  . potassium chloride  40 mEq Oral Once  . protein supplement shake  11 oz Oral Q24H  . tamsulosin  0.4 mg Oral Daily  . thiamine  100 mg Oral Daily   Continuous Infusions:    LOS: 9 days    Time spent: 25 minutes  Greater than 50%  of the time spent on counseling and coordinating the care.   Leisa Lenz, MD [facility] Pager [phone]  If 7PM-7AM, please contact night-coverage [email or web address] Password [email or web address] 11/18/2017, 9:11 AM

## 2017-11-18 NOTE — Progress Notes (Signed)
SLP Cancellation Note  Patient Details Name: Joshua Henson MRN: [id number] DOB: [birth year]   Cancelled treatment:       Reason Eval/Treat Not Completed: Other (comment)(pt agitated per RN and desiring to leave hospital, will defer treatment)   Claudie Fisherman, [place] [facility] SLP [phone]

## 2017-11-18 NOTE — Progress Notes (Signed)
[name], patient's girlfriend called again upset because she has not heard from the doctor. She said she is going to have him IVC'd.  Dr. Charlies Silvers notified via text page.

## 2017-11-18 NOTE — Progress Notes (Signed)
Joshua Henson, patient's girlfriend is here and requesting to speak with the doctor.  Dr. Charlies Silvers notified via text page.

## 2017-11-18 NOTE — Progress Notes (Signed)
Patient determined to leave saying we can not hold him against his will.  Patient requesting to speak with the police.  Security and police came and spoke with patient.  Patient stated he does not want to stay here and that he wants to go home.  Dr. Rogers Blocker notified.  Per Dr. Rogers Blocker patient may leave AMA.  [name], patient's girl friend notified via telephone.  She is upset stating that patient is not in his right mind. That he cannot leave.  [name] asked that the doctor call her ASAP.  Dr. Rogers Blocker notified via text page.

## 2017-11-18 NOTE — Evaluation (Signed)
Physical Therapy Evaluation Patient Details Name: Joshua Henson MRN: [id number] DOB: June 01, 1946 Today's Date: 11/18/2017   History of Present Illness  71 year old male with medical history significant for alcohol cirrhosis, coronary artery disease, anxiety and depression, hypertension, history of stroke and prostate cancer. Patient presented to ED with alcohol withdrawal. Chest x-ray suspicious for L PNA, pt hypokalemic and with elevated lactic acid.  Clinical Impression  Pt admitted with above diagnosis. Pt currently with functional limitations due to the deficits listed below (see PT Problem List). Pt very weak as well as not following commands consistently. Needed mod A to stand as well as to pivot to chair. Unsafe to go home at this point. Recommend post  acute rehab. Pt will benefit from skilled PT to increase their independence and safety with mobility to allow discharge to the venue listed below.       Follow Up Recommendations SNF;Supervision/Assistance - 24 hour    Equipment Recommendations  None recommended by PT    Recommendations for Other Services OT consult     Precautions / Restrictions Precautions Precautions: Fall Precaution Comments: pt not following commands consistently Restrictions Weight Bearing Restrictions: No      Mobility  Bed Mobility Overal bed mobility: Needs Assistance Bed Mobility: Supine to Sit     Supine to sit: Min assist     General bed mobility comments: vc's needed to roll over, min A needed for LE's off bed and trunk elevation to sitting. Pt soiled of BM and did not seem to realize  Transfers Overall transfer level: Needs assistance Equipment used: Rolling walker (2 wheeled) Transfers: Sit to/from [facility] Sit to Stand: Min assist Stand pivot transfers: Mod assist;+2 safety/equipment       General transfer comment: pt stood to RW with min A for power up but maintained only about 10 secs before impulsively  sitting when being asked to wait for soiled bedsheets to be moved out of the way. Pt also tried to lie down at that point but did follow commands to remain sitting. Pt had difficulty stepping feet in standing to turn to chair, mod A at hips for wt shifting toward chair.   Ambulation/Gait             General Gait Details: unable  Stairs            Wheelchair Mobility    Modified Rankin (Stroke Patients Only)       Balance Overall balance assessment: Needs assistance Sitting-balance support: No upper extremity supported Sitting balance-Leahy Scale: Fair Sitting balance - Comments: pt able to wt shift in sitting EOB without LOB   Standing balance support: Bilateral upper extremity supported Standing balance-Leahy Scale: Poor Standing balance comment: unable to stand without B UE support                             Pertinent Vitals/Pain Pain Assessment: No/denies pain    Home Living Family/patient expects to be discharged to:: Private residence Living Arrangements: Spouse/significant other Available Help at Discharge: Family;Available PRN/intermittently Type of Home: House Home Access: Stairs to enter Entrance Stairs-Rails: Right;Left Entrance Stairs-Number of Steps: 3 Home Layout: Two level;Able to live on main level with bedroom/bathroom Home Equipment: Gilford Rile - 2 wheels;Shower seat - built in;Bedside commode;Grab bars - tub/shower Additional Comments: Information obtained from previous encounter    Prior Function Level of Independence: Independent with assistive device(s)         Comments:  RW     Hand Dominance        Extremity/Trunk Assessment   Upper Extremity Assessment Upper Extremity Assessment: Generalized weakness    Lower Extremity Assessment Lower Extremity Assessment: Generalized weakness    Cervical / Trunk Assessment Cervical / Trunk Assessment: Kyphotic  Communication   Communication: HOH  Cognition  Arousal/Alertness: Awake/alert Behavior During Therapy: WFL for tasks assessed/performed Overall Cognitive Status: Impaired/Different from baseline Area of Impairment: Memory;Following commands;Problem solving;Safety/judgement                     Memory: Decreased short-term memory Following Commands: Follows one step commands with increased time;Follows one step commands inconsistently Safety/Judgement: Decreased awareness of safety   Problem Solving: Requires tactile cues;Requires verbal cues General Comments: had to repeatedly tell pt not to try to get up before chair was set up. Pt impulsive with mobility. Poor historian. Did not follow all instructional commands when transferring, tactile cues and manual facilitation needed      General Comments      Exercises     Assessment/Plan    PT Assessment Patient needs continued PT services  PT Problem List Decreased strength;Decreased balance;Decreased mobility;Decreased coordination;Decreased knowledge of use of DME;Decreased cognition;Decreased safety awareness;Decreased knowledge of precautions       PT Treatment Interventions DME instruction;Gait training;Functional mobility training;Therapeutic activities;Therapeutic exercise;Balance training;Cognitive remediation;Patient/family education    PT Goals (Current goals can be found in the Care Plan section)  Acute Rehab PT Goals Patient Stated Goal: "get out of here" PT Goal Formulation: With patient Time For Goal Achievement: 12/02/17 Potential to Achieve Goals: Good    Frequency Min 2X/week   Barriers to discharge        Co-evaluation               AM-PAC PT "6 Clicks" Daily Activity  Outcome Measure Difficulty turning over in bed (including adjusting bedclothes, sheets and blankets)?: A Little Difficulty moving from lying on back to sitting on the side of the bed? : Unable Difficulty sitting down on and standing up from a chair with arms (e.g.,  wheelchair, bedside commode, etc,.)?: Unable Help needed moving to and from a bed to chair (including a wheelchair)?: A Lot Help needed walking in hospital room?: Total Help needed climbing 3-5 steps with a railing? : Total 6 Click Score: 9    End of Session Equipment Utilized During Treatment: Gait belt Activity Tolerance: Patient limited by fatigue Patient left: in chair;with call bell/phone within reach;with nursing/sitter in room Nurse Communication: Mobility status PT Visit Diagnosis: Unsteadiness on feet (R26.81);Muscle weakness (generalized) (M62.81)    Time: 1610-9604 PT Time Calculation (min) (ACUTE ONLY): 17 min   Charges:   PT Evaluation $PT Eval Moderate Complexity: 1 Mod     PT G Codes:        [place]  [place] 11/18/2017, 1:32 PM

## 2017-11-18 NOTE — Progress Notes (Signed)
Patient stated he is wanting to leave.  Patient stated he needed his boots because he doesn't want to walk home in the rain.  Patient started yelling "emergency vacate the hospital".  Patient stated he was going to continue yelling this until the doctor came to see him.  Dr. Charlies Silvers notified.  2 mg IV Ativan given per MD order.

## 2017-11-19 LAB — CBC
HCT: 37 % — ABNORMAL LOW (ref 39.0–52.0)
HEMOGLOBIN: 12.3 g/dL — AB (ref 13.0–17.0)
MCH: 34.6 pg — ABNORMAL HIGH (ref 26.0–34.0)
MCHC: 33.2 g/dL (ref 30.0–36.0)
MCV: 104.2 fL — ABNORMAL HIGH (ref 78.0–100.0)
Platelets: 269 10*3/uL (ref 150–400)
RBC: 3.55 MIL/uL — AB (ref 4.22–5.81)
RDW: 14 % (ref 11.5–15.5)
WBC: 4 10*3/uL (ref 4.0–10.5)

## 2017-11-19 LAB — BASIC METABOLIC PANEL
ANION GAP: 8 (ref 5–15)
BUN: 15 mg/dL (ref 6–20)
CO2: 23 mmol/L (ref 22–32)
Calcium: 9.3 mg/dL (ref 8.9–10.3)
Chloride: 106 mmol/L (ref 101–111)
Creatinine, Ser: 0.76 mg/dL (ref 0.61–1.24)
GFR calc Af Amer: >60 mL/min (ref >60)
GLUCOSE: 105 mg/dL — AB (ref 65–99)
POTASSIUM: 3.5 mmol/L (ref 3.5–5.1)
Sodium: 137 mmol/L (ref 135–145)

## 2017-11-19 LAB — GLUCOSE, CAPILLARY
GLUCOSE-CAPILLARY: 112 mg/dL — AB (ref 65–99)
GLUCOSE-CAPILLARY: 112 mg/dL — AB (ref 65–99)
Glucose-Capillary: 100 mg/dL — ABNORMAL HIGH (ref 65–99)
Glucose-Capillary: 105 mg/dL — ABNORMAL HIGH (ref 65–99)
Glucose-Capillary: 105 mg/dL — ABNORMAL HIGH (ref 65–99)
Glucose-Capillary: 113 mg/dL — ABNORMAL HIGH (ref 65–99)
Glucose-Capillary: 118 mg/dL — ABNORMAL HIGH (ref 65–99)

## 2017-11-19 LAB — MAGNESIUM: MAGNESIUM: 1.9 mg/dL (ref 1.7–2.4)

## 2017-11-19 MED ORDER — CLOMIPHENE CITRATE 50 MG PO TABS: 25.0000 mg | ORAL_TABLET | Freq: Every day | ORAL | Status: DC

## 2017-11-19 MED ORDER — QUETIAPINE FUMARATE 25 MG PO TABS
25.0000 mg | ORAL_TABLET | Freq: Every day | ORAL | Status: DC
  Administered 2017-11-19 – 2017-11-21 (×3): 25 mg via ORAL
  Filled 2017-11-19 (×3): qty 1

## 2017-11-19 MED ORDER — PANTOPRAZOLE SODIUM 40 MG PO TBEC
40.0000 mg | DELAYED_RELEASE_TABLET | Freq: Every day | ORAL | Status: DC
  Administered 2017-11-20: 40 mg via ORAL
  Filled 2017-11-19: qty 1

## 2017-11-19 MED ORDER — QUETIAPINE FUMARATE 25 MG PO TABS
50.0000 mg | ORAL_TABLET | Freq: Every day | ORAL | Status: DC
  Administered 2017-11-19: 50 mg via ORAL
  Filled 2017-11-19: qty 2

## 2017-11-19 MED ORDER — METOPROLOL TARTRATE 50 MG PO TABS
50.0000 mg | ORAL_TABLET | Freq: Two times a day (BID) | ORAL | Status: DC
  Administered 2017-11-19 – 2017-11-21 (×4): 50 mg via ORAL
  Filled 2017-11-19 (×4): qty 1

## 2017-11-19 NOTE — Progress Notes (Signed)
PROGRESS NOTE    Patient: Joshua Henson     PCP: No primary care provider on file.                    DOB: 1946-11-09            DOA: 11/09/2017 [id number]             DOS: 11/19/2017, 12:11 PM  Date of Service: the patient was seen and examined on 11/19/2017  --------------------------------------------------------------------------------------------------------------------------------------------------- Subjective: Confused, mildly agitated, able to follow some instructions and conversation, sitter at bedside Nursing staff continue to report some agitation confusion overnight.   Assessment & Plan:  Active Problems:   Acute respiratory failure (HCC)   Hypomagnesemia   Delirium tremens (HCC)   Essential hypertension   Alcohol withdrawal ([place])   Alcoholic ketosis   Dehydration   LFT elevation  Brief Narrative:  71 year old male with medical history significant for alcohol cirrhosis, coronary artery disease, anxiety and depression, hypertension, history of stroke and prostate cancer. Patient presented to ED with alcohol withdrawal. Patient reported his last drink was about 3 days prior to admission. Initial alcohol level was 118. UDS was positive for benzos and opiates. Blood work was notable for hypokalemia, anion gap of 21, lactic acid of 8.04, MCV 99.7. Chest x-ray was concerning for left basilar opacity suspicious for pneumonia. Critical care evaluated the patient for possible need for Precedex. --------------------------------------------------------------------------------------------------------------------------------------------------- Assessment & Plan:   Active Problems: Acute alcohol intoxication / alcohol withdrawal with delirium - Continue CIWA protocol -Confusion and agitation we have added Seroquel 25 mg every morning, 50 mg nightly  Left basilar pneumonia, unspecified organism - Stopped rocephin 11/29 - Stable resp status - No fevers   Essential  hypertension - Continue Klonopin, Lasix, hydralazine, metoprolol - Monitor on telemetry   Macrocytic anemia - Due to bone marrow suppression from alcohol abuse  - Hemoglobin stable   Hypomagnesemia / hypokalemia - Due to alcohol abuse - Continue to supplement potassium   DVT prophylaxis: Lovenox subQ Code Status: full code  Family Communication: No family at the bedside Disposition Plan: transfer to tele  Consultants:   None   Procedures:  No admission procedures for hospital encounter.   Antimicrobials:  Anti-infectives (From admission, onward)   Start     Dose/Rate Route Frequency Ordered Stop   11/14/17 1000  cefTRIAXone (ROCEPHIN) 1 g in dextrose 5 % 50 mL IVPB  Status:  Discontinued     1 g 100 mL/hr over 30 Minutes Intravenous Every 24 hours 11/14/17 0830 11/15/17 1433   11/12/17 2000  vancomycin (VANCOCIN) 1,500 mg in sodium chloride 0.9 % 500 mL IVPB  Status:  Discontinued     1,500 mg 250 mL/hr over 120 Minutes Intravenous Every 12 hours 11/12/17 1011 11/14/17 0830   11/12/17 1800  piperacillin-tazobactam (ZOSYN) IVPB 3.375 g  Status:  Discontinued     3.375 g 12.5 mL/hr over 240 Minutes Intravenous Every 8 hours 11/12/17 1718 11/14/17 0830   11/12/17 1030  vancomycin (VANCOCIN) 1,500 mg in sodium chloride 0.9 % 500 mL IVPB     1,500 mg 250 mL/hr over 120 Minutes Intravenous  Once 11/12/17 1011 11/12/17 1228   11/11/17 1400  ampicillin-sulbactam (UNASYN) 1.5 g in sodium chloride 0.9 % 50 mL IVPB  Status:  Discontinued     1.5 g 100 mL/hr over 30 Minutes Intravenous Every 6 hours 11/11/17 1258 11/12/17 1705        Objective: Vitals:   11/18/17 1009  11/18/17 1400 11/18/17 2100 11/19/17 0603  BP:  119/67 (!) 161/93 (!) 172/86  Pulse: 65 77 68 68  Resp:  20 20 18   Temp:  98 F (36.7 C) 98.3 F (36.8 C) 98.4 F (36.9 C)  TempSrc:  Oral Oral Oral  SpO2:  99% 95% 95%  Weight:    87.8 kg (193 lb 9 oz)  Height:        Intake/Output Summary (Last 24  hours) at 11/19/2017 1211 Last data filed at 11/19/2017 0900 Gross per 24 hour  Intake 320 ml  Output 300 ml  Net 20 ml   Filed Weights   11/14/17 0400 11/17/17 1537 11/19/17 0603  Weight: 91.7 kg (202 lb 2.6 oz) 87.5 kg (192 lb 14.4 oz) 87.8 kg (193 lb 9 oz)    Examination:  General exam: Appears calm and comfortable  Respiratory system: Clear to auscultation. Respiratory effort normal. Cardiovascular system: S1 & S2 heard, RRR. No JVD, murmurs, rubs, gallops or clicks. No pedal edema. Gastrointestinal system: Abdomen is nondistended, soft and nontender. No organomegaly or masses felt. Normal bowel sounds heard. Central nervous system: Alert and oriented. No focal neurological deficits. Extremities: Symmetric 5 x 5 power. Skin: No rashes, lesions or ulcers Psychiatry: Judgement and insight appear normal. Mood & affect appropriate.     Data Reviewed: I have personally reviewed following labs and imaging studies  CBC: Recent Labs  Lab 11/13/17 0549 11/15/17 0257 11/16/17 0507 11/17/17 0321 11/18/17 0621 11/19/17 0600  WBC 4.3 4.0 3.5* 3.6* 3.1* 4.0  NEUTROABS 2.8 2.1  --   --   --   --   HGB 10.6* 12.1* 12.1* 11.9* 12.0* 12.3*  HCT 30.8* 34.7* 35.2* 34.5* 34.8* 37.0*  MCV 103.0* 102.7* 102.0* 101.8* 102.4* 104.2*  PLT 112* 163 184 215 228 657   Basic Metabolic Panel: Recent Labs  Lab 11/12/17 1712  11/13/17 0549 11/13/17 2019  11/15/17 0257 11/16/17 0507 11/17/17 0321 11/18/17 0621 11/19/17 0600  NA  --    < > 137  --    < > 140 139 138 138 137  K  --    < > 3.3*  --    < > 3.5 3.3* 3.1* 2.9* 3.5  CL  --    < > 112*  --    < > 108 104 105 106 106  CO2  --    < > 20*  --    < > 23 24 22 24 23   GLUCOSE  --    < > 121*  --    < > 116* 110* 115* 101* 105*  BUN  --    < > 12  --    < > 10 18 19 15 15   CREATININE  --    < > 0.60*  --    < > 0.66 0.69 0.73 0.69 0.76  CALCIUM  --    < > 7.8*  --    < > 8.9 9.3 9.1 9.1 9.3  MG 1.8  --  1.8 1.9   < > 1.5* 1.7 1.8 2.1  1.9  PHOS 1.8*  --  1.8* 1.9*  --   --   --   --   --   --    < > = values in this interval not displayed.   GFR: Estimated Creatinine Clearance: 90.9 mL/min (by C-G formula based on SCr of 0.76 mg/dL). Liver Function Tests: Recent Labs  Lab 11/13/17 0549  AST 46*  ALT  18  ALKPHOS 65  BILITOT 1.1  PROT 5.5*  ALBUMIN 2.4*   No results for input(s): LIPASE, AMYLASE in the last 168 hours. No results for input(s): AMMONIA in the last 168 hours. Coagulation Profile: No results for input(s): INR, PROTIME in the last 168 hours. Cardiac Enzymes: No results for input(s): CKTOTAL, CKMB, CKMBINDEX, TROPONINI in the last 168 hours. BNP (last 3 results) No results for input(s): PROBNP in the last 8760 hours. HbA1C: No results for input(s): HGBA1C in the last 72 hours. CBG: Recent Labs  Lab 11/18/17 2125 11/19/17 0128 11/19/17 0336 11/19/17 0721 11/19/17 1202  GLUCAP 125* 105* 112* 100* 118*   Lipid Profile: No results for input(s): CHOL, HDL, LDLCALC, TRIG, CHOLHDL, LDLDIRECT in the last 72 hours. Thyroid Function Tests: No results for input(s): TSH, T4TOTAL, FREET4, T3FREE, THYROIDAB in the last 72 hours. Anemia Panel: No results for input(s): VITAMINB12, FOLATE, FERRITIN, TIBC, IRON, RETICCTPCT in the last 72 hours. Sepsis Labs: No results for input(s): PROCALCITON, LATICACIDVEN in the last 168 hours.  Recent Results (from the past 240 hour(s))  Culture, respiratory (NON-Expectorated)     Status: None   Collection Time: 11/11/17  1:29 PM  Result Value Ref Range Status   Specimen Description TRACHEAL ASPIRATE  Final   Special Requests NONE  Final   Gram Stain   Final    ABUNDANT WBC PRESENT, PREDOMINANTLY PMN FEW SQUAMOUS EPITHELIAL CELLS PRESENT ABUNDANT GRAM POSITIVE COCCI IN PAIRS IN CHAINS IN CLUSTERS FEW GRAM NEGATIVE RODS FEW GRAM NEGATIVE COCCI IN PAIRS RARE GRAM POSITIVE RODS    Culture   Final    Consistent with normal respiratory flora. Performed at [facility], [place] [address], [place], [place] [address]    Report Status 11/13/2017 FINAL  Final  Culture, blood (routine x 2)     Status: None   Collection Time: 11/12/17  5:15 PM  Result Value Ref Range Status   Specimen Description BLOOD RIGHT ANTECUBITAL  Final   Special Requests   Final    BOTTLES DRAWN AEROBIC ONLY Blood Culture adequate volume   Culture   Final    NO GROWTH 5 DAYS Performed at [facility], [place] [address], [place], [place] [address]    Report Status 11/18/2017 FINAL  Final  Culture, blood (routine x 2)     Status: None   Collection Time: 11/12/17  5:20 PM  Result Value Ref Range Status   Specimen Description BLOOD BLOOD LEFT HAND  Final   Special Requests   Final    BOTTLES DRAWN AEROBIC ONLY Blood Culture adequate volume   Culture   Final    NO GROWTH 5 DAYS Performed at [facility], [place] [address] [place], [place] [address]    Report Status 11/18/2017 FINAL  Final  MRSA PCR Screening     Status: None   Collection Time: 11/13/17 10:39 AM  Result Value Ref Range Status   MRSA by PCR NEGATIVE NEGATIVE Final    Comment:        The GeneXpert MRSA Assay (FDA approved for NASAL specimens only), is one component of a comprehensive MRSA colonization surveillance program. It is not intended to diagnose MRSA infection nor to guide or monitor treatment for MRSA infections.        Radiology Studies: No results found.  Scheduled Meds: . aspirin  81 mg Oral Daily  . [START ON 11/20/2017] clomiPHENE  25 mg Oral Q breakfast  . cloNIDine  0.2 mg Oral Daily  . folic acid  1 mg Per Tube Daily  . hydrALAZINE  25 mg Oral Q8H  . insulin aspart  0-9 Units Subcutaneous Q4H  . lactulose  20 g Oral TID  . LORazepam  2 mg Oral Q8H  . mouth rinse  15 mL Mouth Rinse BID  . metoprolol tartrate  25 mg Oral BID  . multivitamin  1 tablet Oral Daily  . pantoprazole  40 mg Oral BID  . protein supplement shake  11 oz Oral Q24H  . QUEtiapine  50 mg Oral  QHS  . tamsulosin  0.4 mg Oral Daily  . thiamine  100 mg Oral Daily   Continuous Infusions:   LOS: 10 days    Time spent: 25   Deatra James, MD [facility] Pager [phone]  If 7PM-7AM, please contact night-coverage [email or web address] Password [email or web address] 11/19/2017, 12:11 PM

## 2017-11-19 NOTE — Progress Notes (Signed)
Patient aggressive, confused, agitated and attempting to climb OOB. Patient has been calling wife on his cell phone all night and screaming about various things. Patient is frequently re-oriented and calms down but gets agitated and aggressive. Security was called at beginning of shift. PRN ativan has been given along with scheduled medication. Patient sleeps briefly and continues to stay aggressive. Sitter assists with re-orienting and keeping patient safely in bed. Will c/t monitor.

## 2017-11-19 NOTE — Progress Notes (Signed)
Patient continues to be agitated and aggressive. Patient continues to scream and yell. Patient attempts to climb OOB and has been given multiple prns and scheduled meds without much effectiveness. Day shift nurse has been reported to and advised of last prn and scheduled medication. Will ct monitor.

## 2017-11-20 DIAGNOSIS — R4182 Altered mental status, unspecified: Secondary | ICD-10-CM

## 2017-11-20 DIAGNOSIS — R109 Unspecified abdominal pain: Secondary | ICD-10-CM

## 2017-11-20 DIAGNOSIS — F17210 Nicotine dependence, cigarettes, uncomplicated: Secondary | ICD-10-CM

## 2017-11-20 LAB — GLUCOSE, CAPILLARY
GLUCOSE-CAPILLARY: 101 mg/dL — AB (ref 65–99)
GLUCOSE-CAPILLARY: 108 mg/dL — AB (ref 65–99)
GLUCOSE-CAPILLARY: 98 mg/dL (ref 65–99)
Glucose-Capillary: 100 mg/dL — ABNORMAL HIGH (ref 65–99)
Glucose-Capillary: 89 mg/dL (ref 65–99)
Glucose-Capillary: 113 mg/dL — ABNORMAL HIGH (ref 65–99)

## 2017-11-20 LAB — MAGNESIUM: Magnesium: 1.9 mg/dL (ref 1.7–2.4)

## 2017-11-20 MED ORDER — QUETIAPINE FUMARATE 100 MG PO TABS
200.0000 mg | ORAL_TABLET | Freq: Every day | ORAL | Status: DC
Start: 2017-11-20 — End: 2017-11-21
  Administered 2017-11-20: 200 mg via ORAL
  Filled 2017-11-20: qty 2

## 2017-11-20 MED ORDER — CLONIDINE HCL 0.1 MG PO TABS
0.1000 mg | ORAL_TABLET | Freq: Every day | ORAL | Status: DC
  Administered 2017-11-21: 0.1 mg via ORAL
  Filled 2017-11-20: qty 1

## 2017-11-20 MED ORDER — FOLIC ACID 1 MG PO TABS
1.0000 mg | ORAL_TABLET | Freq: Every day | ORAL | Status: DC
  Administered 2017-11-20 – 2017-11-21 (×2): 1 mg via ORAL
  Filled 2017-11-20 (×2): qty 1

## 2017-11-20 NOTE — Progress Notes (Signed)
PROGRESS NOTE    Patient: Joshua Henson     PCP: No primary care provider on file.                    DOB: 01-Mar-1946            DOA: 11/09/2017 [id number]             DOS: 11/20/2017, 11:31 AM   Date of Service: the patient was seen and examined on 11/20/2017 Subjective:  Per nursing staff was still confused overnight agitated, as needed Ativan had to be used.  This morning patient was mildly lethargic, sleepy as per staff was restless all night long.  --------------------------------------------------------------------------------------------------------------------------------------------------------------------------------------  Brief Narrative: 71 year old male with medical history significant for alcohol cirrhosis, coronary artery disease, anxiety and depression, hypertension, history of stroke and prostate cancer. Patient presented to ED with alcohol withdrawal. Patient reported his last drink was about 3 days prior to admission. Initial alcohol level was 118. UDS was positive for benzos and opiates. Blood work was notable for hypokalemia, anion gap of 21, lactic acid of 8.04, MCV 99.7. Chest x-ray was concerning for left basilar opacity suspicious for pneumonia. Critical care evaluated the patient for possible need for Precedex. ------------------------------------------------------------------------------------------------------------------------------------------------------------------------------------- Assessment & Plan:  Acute alcohol intoxication / alcohol withdrawal with delirium - Continue CIWA protocol -Confusion and agitation we have added Seroquel to increase the night dose of 200 mg nightly and continue 25 mg every morning  Left basilar pneumonia, unspecified organism - Stopped rocephin 11/29 - Stable resp status - No fevers  Essential hypertension - Continue Klonopin, Lasix, hydralazine, metoprolol - Monitor on telemetry  Macrocytic anemia - Due to  bone marrow suppression from alcohol abuse - Hemoglobin stable  Hypomagnesemia / hypokalemia - Due to alcohol abuse - Continue to supplement potassium  DVT prophylaxis:Lovenox subQ Code Status:full code  Family Communication:No family at the bedside Disposition Plan: PT consulted, social worker/case management consulted planning to discharge to SNF due to debility,   Consultants:   Psych   Procedures:  No admission procedures for hospital encounter.   Antimicrobials:  Anti-infectives (From admission, onward)   Start     Dose/Rate Route Frequency Ordered Stop   11/14/17 1000  cefTRIAXone (ROCEPHIN) 1 g in dextrose 5 % 50 mL IVPB  Status:  Discontinued     1 g 100 mL/hr over 30 Minutes Intravenous Every 24 hours 11/14/17 0830 11/15/17 1433   11/12/17 2000  vancomycin (VANCOCIN) 1,500 mg in sodium chloride 0.9 % 500 mL IVPB  Status:  Discontinued     1,500 mg 250 mL/hr over 120 Minutes Intravenous Every 12 hours 11/12/17 1011 11/14/17 0830   11/12/17 1800  piperacillin-tazobactam (ZOSYN) IVPB 3.375 g  Status:  Discontinued     3.375 g 12.5 mL/hr over 240 Minutes Intravenous Every 8 hours 11/12/17 1718 11/14/17 0830   11/12/17 1030  vancomycin (VANCOCIN) 1,500 mg in sodium chloride 0.9 % 500 mL IVPB     1,500 mg 250 mL/hr over 120 Minutes Intravenous  Once 11/12/17 1011 11/12/17 1228   11/11/17 1400  ampicillin-sulbactam (UNASYN) 1.5 g in sodium chloride 0.9 % 50 mL IVPB  Status:  Discontinued     1.5 g 100 mL/hr over 30 Minutes Intravenous Every 6 hours 11/11/17 1258 11/12/17 1705       Objective: Vitals:   11/19/17 0603 11/19/17 1400 11/19/17 2057 11/20/17 0457  BP: (!) 172/86 (!) 148/82 (!) 157/98 127/85  Pulse: 68 88 67 69  Resp: 18  18 18 18   Temp: 98.4 F (36.9 C) 98.1 F (36.7 C) 98.5 F (36.9 C) 97.7 F (36.5 C)  TempSrc: Oral Oral Oral Oral  SpO2: 95% 99% 97% 97%  Weight: 87.8 kg (193 lb 9 oz)     Height:        Intake/Output Summary (Last 24  hours) at 11/20/2017 1131 Last data filed at 11/20/2017 1001 Gross per 24 hour  Intake 265 ml  Output 250 ml  Net 15 ml   Filed Weights   11/14/17 0400 11/17/17 1537 11/19/17 0603  Weight: 91.7 kg (202 lb 2.6 oz) 87.5 kg (192 lb 14.4 oz) 87.8 kg (193 lb 9 oz)    Examination:  General exam: Sleepy, follows some commands, per nursing staff was agitated and awake most of the night Respiratory system: Clear to auscultation. Respiratory effort normal. Cardiovascular system: S1 & S2 heard, RRR. No JVD, murmurs, rubs, gallops or clicks. No pedal edema. Gastrointestinal system: Abdomen is nondistended, soft and nontender. No organomegaly or masses felt. Normal bowel sounds heard. Central nervous system: Alert and oriented. No focal neurological deficits. Extremities: Symmetric 5 x 5 power. Skin: No rashes, lesions or ulcers Psychiatry: Judgement and insight appear normal. Mood & affect appropriate.     Data Reviewed: I have personally reviewed following labs and imaging studies  CBC: Recent Labs  Lab 11/15/17 0257 11/16/17 0507 11/17/17 0321 11/18/17 0621 11/19/17 0600  WBC 4.0 3.5* 3.6* 3.1* 4.0  NEUTROABS 2.1  --   --   --   --   HGB 12.1* 12.1* 11.9* 12.0* 12.3*  HCT 34.7* 35.2* 34.5* 34.8* 37.0*  MCV 102.7* 102.0* 101.8* 102.4* 104.2*  PLT 163 184 215 228 706   Basic Metabolic Panel: Recent Labs  Lab 11/13/17 2019  11/15/17 0257 11/16/17 0507 11/17/17 0321 11/18/17 0621 11/19/17 0600 11/20/17 0553  NA  --    < > 140 139 138 138 137  --   K  --    < > 3.5 3.3* 3.1* 2.9* 3.5  --   CL  --    < > 108 104 105 106 106  --   CO2  --    < > 23 24 22 24 23   --   GLUCOSE  --    < > 116* 110* 115* 101* 105*  --   BUN  --    < > 10 18 19 15 15   --   CREATININE  --    < > 0.66 0.69 0.73 0.69 0.76  --   CALCIUM  --    < > 8.9 9.3 9.1 9.1 9.3  --   MG 1.9   < > 1.5* 1.7 1.8 2.1 1.9 1.9  PHOS 1.9*  --   --   --   --   --   --   --    < > = values in this interval not  displayed.   GFR: Estimated Creatinine Clearance: 90.9 mL/min (by C-G formula based on SCr of 0.76 mg/dL). Liver Function Tests: No results for input(s): AST, ALT, ALKPHOS, BILITOT, PROT, ALBUMIN in the last 168 hours. No results for input(s): LIPASE, AMYLASE in the last 168 hours. No results for input(s): AMMONIA in the last 168 hours. Coagulation Profile: No results for input(s): INR, PROTIME in the last 168 hours. Cardiac Enzymes: No results for input(s): CKTOTAL, CKMB, CKMBINDEX, TROPONINI in the last 168 hours. BNP (last 3 results) No results for input(s): PROBNP in the last 8760 hours.  HbA1C: No results for input(s): HGBA1C in the last 72 hours. CBG: Recent Labs  Lab 11/19/17 2118 11/20/17 0002 11/20/17 0454 11/20/17 0723 11/20/17 1116  GLUCAP 105* 113* 101* 100* 89   Lipid Profile: No results for input(s): CHOL, HDL, LDLCALC, TRIG, CHOLHDL, LDLDIRECT in the last 72 hours. Thyroid Function Tests: No results for input(s): TSH, T4TOTAL, FREET4, T3FREE, THYROIDAB in the last 72 hours. Anemia Panel: No results for input(s): VITAMINB12, FOLATE, FERRITIN, TIBC, IRON, RETICCTPCT in the last 72 hours. Sepsis Labs: No results for input(s): PROCALCITON, LATICACIDVEN in the last 168 hours.  Recent Results (from the past 240 hour(s))  Culture, respiratory (NON-Expectorated)     Status: None   Collection Time: 11/11/17  1:29 PM  Result Value Ref Range Status   Specimen Description TRACHEAL ASPIRATE  Final   Special Requests NONE  Final   Gram Stain   Final    ABUNDANT WBC PRESENT, PREDOMINANTLY PMN FEW SQUAMOUS EPITHELIAL CELLS PRESENT ABUNDANT GRAM POSITIVE COCCI IN PAIRS IN CHAINS IN CLUSTERS FEW GRAM NEGATIVE RODS FEW GRAM NEGATIVE COCCI IN PAIRS RARE GRAM POSITIVE RODS    Culture   Final    Consistent with normal respiratory flora. Performed at [facility], [place] [address] [place], [place] [address]    Report Status 11/13/2017 FINAL  Final  Culture, blood  (routine x 2)     Status: None   Collection Time: 11/12/17  5:15 PM  Result Value Ref Range Status   Specimen Description BLOOD RIGHT ANTECUBITAL  Final   Special Requests   Final    BOTTLES DRAWN AEROBIC ONLY Blood Culture adequate volume   Culture   Final    NO GROWTH 5 DAYS Performed at [facility], [place] [address], [place], [place] [address]    Report Status 11/18/2017 FINAL  Final  Culture, blood (routine x 2)     Status: None   Collection Time: 11/12/17  5:20 PM  Result Value Ref Range Status   Specimen Description BLOOD BLOOD LEFT HAND  Final   Special Requests   Final    BOTTLES DRAWN AEROBIC ONLY Blood Culture adequate volume   Culture   Final    NO GROWTH 5 DAYS Performed at [facility], [place] [address], [place],  [address]    Report Status 11/18/2017 FINAL  Final  MRSA PCR Screening     Status: None   Collection Time: 11/13/17 10:39 AM  Result Value Ref Range Status   MRSA by PCR NEGATIVE NEGATIVE Final    Comment:        The GeneXpert MRSA Assay (FDA approved for NASAL specimens only), is one component of a comprehensive MRSA colonization surveillance program. It is not intended to diagnose MRSA infection nor to guide or monitor treatment for MRSA infections.        Radiology Studies: No results found.  Scheduled Meds: . aspirin  81 mg Oral Daily  . [START ON 11/21/2017] cloNIDine  0.1 mg Oral Daily  . folic acid  1 mg Oral Daily  . hydrALAZINE  25 mg Oral Q8H  . insulin aspart  0-9 Units Subcutaneous Q4H  . lactulose  20 g Oral TID  . LORazepam  2 mg Oral Q8H  . mouth rinse  15 mL Mouth Rinse BID  . metoprolol tartrate  50 mg Oral BID  . multivitamin  1 tablet Oral Daily  . protein supplement shake  11 oz Oral Q24H  . QUEtiapine  200 mg Oral QHS  . QUEtiapine  25 mg Oral QAC breakfast  . tamsulosin  0.4 mg Oral Daily  . thiamine  100 mg Oral Daily   Continuous Infusions:   LOS: 11 days    Time spent: >25  minutes   Deatra James, MD [facility] Pager [phone]  If 7PM-7AM, please contact night-coverage [email or web address] Password [email or web address] 11/20/2017, 11:31 AM

## 2017-11-20 NOTE — NC FL2 (Signed)
[place] LEVEL OF CARE SCREENING TOOL     IDENTIFICATION  Patient Name: Joshua Henson Birthdate: 24-Aug-1946 Sex: male Admission Date (Current Location): 11/09/2017  [facility] and [place] Number:  Herbalist and Address:  [facility],  [place] [address] [place]      Provider Number: [id number]  Attending Physician Name and Address:  Deatra James, MD  Relative Name and Phone Number:     Ian Malkin [phone]  Current Level of Care: Hospital Recommended Level of Care: [place] Prior Approval Number:    Date Approved/Denied:   PASRR Number: [id number]  Discharge Plan: SNF    Current Diagnoses: Patient Active Problem List   Diagnosis Date Noted  . LFT elevation   . Alcoholic ketosis   . Dehydration   . Alcohol withdrawal ([place]) 11/09/2017  . Alcohol-induced acute pancreatitis   . Hematemesis 02/15/2017  . Acute pancreatitis 02/15/2017  . Alcohol dependence with unspecified alcohol-induced disorder ([place])   . Alcohol withdrawal delirium ([place]) 09/13/2016  . C. difficile colitis 03/30/2016  . Essential hypertension 03/23/2016  . Fall   . Tobacco abuse   . Hypokalemia   . CAD (coronary artery disease)   . Cocaine abuse ([place])   . Frequent falls 01/11/2016  . Left rib fracture 01/11/2016  . Acute encephalopathy 01/11/2016  . Polysubstance abuse ([place]) 01/11/2016  . Delirium tremens ([place]) 01/11/2016  . NSTEMI (non-ST elevated myocardial infarction) ([place]) 01/11/2016  . Sepsis due to Enterococcus with acute renal failure and metabolic encephalopathy ([place]) 01/11/2016  . Chronic alcohol abuse   . CAD (coronary artery disease), native coronary artery 01/10/2016  . Fatty liver, alcoholic 19/37/9024  . Hypomagnesemia 04/03/2015  . Ataxia   . Acute respiratory failure ([place]) 09/05/2014  . Alcohol withdrawal, with delirium ([place]) 09/03/2014  . Unspecified cerebral artery occlusion with cerebral infarction  07/05/2014  . Alcohol abuse 07/02/2014  . Current tobacco use 01/17/2014  . Hearing loss 11/25/2013  . Tinnitus of both ears 11/25/2013    Orientation RESPIRATION BLADDER Height & Weight     Self, Place  Normal Continent Weight: 193 lb 9 oz (87.8 kg) Height:  5\' 7"  (170.2 cm)  BEHAVIORAL SYMPTOMS/MOOD NEUROLOGICAL BOWEL NUTRITION STATUS      Continent Diet(See DC summary)  AMBULATORY STATUS COMMUNICATION OF NEEDS Skin   Extensive Assist Verbally Normal                       Personal Care Assistance Level of Assistance  Bathing, Feeding, Dressing Bathing Assistance: Limited assistance Feeding assistance: Independent Dressing Assistance: Limited assistance     Functional Limitations Info  Sight, Hearing, Speech Sight Info: Adequate Hearing Info: Adequate Speech Info: Adequate    SPECIAL CARE FACTORS FREQUENCY  PT (By licensed PT), OT (By licensed OT)     PT Frequency: 5x/week OT Frequency: 5x/week            Contractures      Additional Factors Info  Code Status, Allergies Code Status Info: Full Allergies Info: Lisinopril, Celebrex Celecoxib           Current Medications (11/20/2017):  This is the current hospital active medication list Current Facility-Administered Medications  Medication Dose Route Frequency Provider Last Rate Last Dose  . aspirin chewable tablet 81 mg  81 mg Oral Daily Robbie Lis, MD   81 mg at 11/20/17 0973  . [START ON 11/21/2017] cloNIDine (CATAPRES) tablet 0.1 mg  0.1  mg Oral Daily Shahmehdi, Seyed A, MD      . folic acid (FOLVITE) tablet 1 mg  1 mg Oral Daily Shahmehdi, Seyed A, MD   1 mg at 11/20/17 1121  . hydrALAZINE (APRESOLINE) tablet 25 mg  25 mg Oral Q8H Robbie Lis, MD   25 mg at 11/20/17 0542  . hydrOXYzine (ATARAX/VISTARIL) tablet 10 mg  10 mg Oral TID PRN Eulogio Bear U, DO   10 mg at 11/19/17 1006  . insulin aspart (novoLOG) injection 0-9 Units  0-9 Units Subcutaneous Q4H Raylene Miyamoto, MD   1 Units at  11/18/17 [phone]  . lactulose (CHRONULAC) 10 GM/15ML solution 20 g  20 g Oral TID Robbie Lis, MD   20 g at 11/20/17 0849  . LORazepam (ATIVAN) injection 2-3 mg  2-3 mg Intravenous Q1H PRN Noe Gens L, NP   2 mg at 11/20/17 0733  . LORazepam (ATIVAN) tablet 2 mg  2 mg Oral Q8H Raylene Miyamoto, MD   2 mg at 11/20/17 0542  . MEDLINE mouth rinse  15 mL Mouth Rinse BID Robbie Lis, MD   15 mL at 11/17/17 2200  . metoprolol tartrate (LOPRESSOR) tablet 50 mg  50 mg Oral BID Skipper Cliche A, MD   50 mg at 11/20/17 0848  . multivitamin (PROSIGHT) tablet 1 tablet  1 tablet Oral Daily Robbie Lis, MD   1 tablet at 11/20/17 0848  . oxyCODONE-acetaminophen (PERCOCET/ROXICET) 5-325 MG per tablet 1 tablet  1 tablet Oral Q6H PRN Robbie Lis, MD   1 tablet at 11/20/17 1121  . protein supplement (PREMIER PROTEIN) liquid  11 oz Oral Q24H Robbie Lis, MD   11 oz at 11/19/17 1415  . QUEtiapine (SEROQUEL) tablet 200 mg  200 mg Oral QHS Shahmehdi, Seyed A, MD      . QUEtiapine (SEROQUEL) tablet 25 mg  25 mg Oral QAC breakfast Shahmehdi, Seyed A, MD   25 mg at 11/20/17 0849  . [facility]   Oral PRN Robbie Lis, MD      . tamsulosin [facility]) capsule 0.4 mg  0.4 mg Oral Daily Eulogio Bear U, DO   0.4 mg at 11/20/17 0849  . thiamine (VITAMIN B-1) tablet 100 mg  100 mg Oral Daily Robbie Lis, MD   100 mg at 11/20/17 6256     Discharge Medications: Please see discharge summary for a list of discharge medications.  Relevant Imaging Results:  Relevant Lab Results:   Additional Information SSN: [id number]  Servando Snare, LCSW

## 2017-11-20 NOTE — Progress Notes (Signed)
Date:  November 20, 2017 Chart reviewed for concurrent status and case management needs.  Will continue to follow patient progress.  Discharge Planning: following for needs  Expected discharge date: November 23, 2017  Xyla Leisner, BSN, RN3, CCM   [phone]  

## 2017-11-20 NOTE — Clinical Social Work Note (Signed)
Clinical Social Work Assessment  Patient Details  Name: Joshua Henson MRN: [id number] Date of Birth: 1946/07/07  Date of referral:  11/20/17               Reason for consult:  Facility Placement, Discharge Planning                Permission sought to share information with:  Case Manager, Family Supports, Customer service manager Permission granted to share information::  Yes, Verbal Permission Granted  Name::     Publishing rights manager::     Relationship::  Girlfriend  Contact Information:     Housing/Transportation Living arrangements for the past 2 months:  Single Family Home Source of Information:  Patient, Other (Comment Required)(live in girlfriend) Patient Interpreter Needed:  None Criminal Activity/Legal Involvement Pertinent to Current Situation/Hospitalization:  No - Comment as needed Significant Relationships:  Significant Other, Friend Lives with:  Significant Other Do you feel safe going back to the place where you live?  Yes Need for family participation in patient care:  Yes (Comment)  Care giving concerns:  No care giving concerns at the time of assessment. Patient has a live in girlfriend that is available and proactive.    Social Worker assessment / plan:  LCSW following for SNF placement.  Patient admitted to hospital for alcohol withdrawals.  Patient has a medical history significant of alcohol abuse, alcoholic liver cirrhosis,anxiety/depression, chronic pain.  LCSW met with patient at bedside. Patient currently has a Actuary. No family at bedside.  LCSW discussed PT recommendation for SNF with patient. Patient was agreeable to SNF. Patient's speech was mumbled at the time of assessment.   Patient reported that he lives with his girlfriend, Joshua Henson, his "orphan child" referring to the Nurse Tech in the room and about 9 other children.   LCSW spoke with Joshua Henson by phone. She is also agreeable to SNF and prefers [facility] or [facility]. She reports that  the patient has had rehab in those facilities before.   PLAN: Patient will go to SNF once medically cleared.    Employment status:  Retired Forensic scientist:  [employer] PT Recommendations:  [place], [place] / Referral to community resources:  [place]  Patient/Family's Response to care:  Patient response not assessed. Joshua Henson expressed concerns about patients delirium. She expressed concerns about the hospital considering letting patient leave AMA over the weekend. Joshua Henson is concerned that patient may change his mind about SNF, depending on how much longer he remains in the hospital.   Patient/Family's Understanding of and Emotional Response to Diagnosis, Current Treatment, and Prognosis:  Patients response was not assessed. LCSW had difficulty understanding patients responses. Had to ask him to repeat several times. Joshua Henson appears to have a clear understanding of current diagnosis and PT recommendation for SNF. Patient and family are agreeable to SNF.   Emotional Assessment Appearance:  Appears stated age Attitude/Demeanor/Rapport:    Affect (typically observed):  Accepting Orientation:  Oriented to Self, Oriented to Place Alcohol / Substance use:  Alcohol Use Psych involvement (Current and /or in the community):  No (Comment)  Discharge Needs  Concerns to be addressed:  Substance Abuse Concerns Readmission within the last 30 days:  No Current discharge risk:  Substance Abuse Barriers to Discharge:  Continued Medical Work up   [employer], LCSW 11/20/2017, 12:06 PM

## 2017-11-20 NOTE — Care Management Important Message (Signed)
Important Message  Patient Details  Name: Joshua Henson MRN: [id number] Date of Birth: [birth year]   Medicare Important Message Given:  Yes    Kerin Salen 11/20/2017, 11:14 AMImportant Message  Patient Details  Name: Joshua Henson MRN: [id number] Date of Birth: August 27, 1946   Medicare Important Message Given:  Yes    Kerin Salen 11/20/2017, 11:14 AM

## 2017-11-20 NOTE — Progress Notes (Signed)
Pt has been highly agitated , cursing , trying to get out of bed, speaking ugly to his wife. Unable to redirect / settle him down so Ativan had to be given

## 2017-11-20 NOTE — Progress Notes (Signed)
LCSW following for SNF placement.  Patient and family agreeable to SNF.  Family prefers [facility] or [place] [place], but open to other options.  Patients girlfriend is going to tour facilities.  Patient needs bed offers. Patient must be sitter free for 24 hours before dc to SNF.   LCSW will continue to follow.   Carolin Coy [place]

## 2017-11-20 NOTE — Consult Note (Signed)
[facility] Consult   Reason for Consult:  Management of agitation Referring Physician:  Dr. Roger Shelter Patient Identification: Joshua Henson MRN:  [id number] Principal Diagnosis: Altered mental status Diagnosis:   Patient Active Problem List   Diagnosis Date Noted  . LFT elevation [R94.5]   . Alcoholic ketosis [O70.9]   . Dehydration [E86.0]   . Alcohol withdrawal ([place]) [F10.239] 11/09/2017  . Alcohol-induced acute pancreatitis [K85.20]   . Hematemesis [K92.0] 02/15/2017  . Acute pancreatitis [K85.90] 02/15/2017  . Alcohol dependence with unspecified alcohol-induced disorder ([place]) [F10.29]   . Alcohol withdrawal delirium ([place]) [F10.231] 09/13/2016  . C. difficile colitis [A04.72] 03/30/2016  . Essential hypertension [I10] 03/23/2016  . Fall [W19.XXXA]   . Tobacco abuse [Z72.0]   . Hypokalemia [E87.6]   . CAD (coronary artery disease) [I25.10]   . Cocaine abuse ([place]) [F14.10]   . Frequent falls [R29.6] 01/11/2016  . Left rib fracture [S22.32XA] 01/11/2016  . Acute encephalopathy [G93.40] 01/11/2016  . Polysubstance abuse ([place]) [F19.10] 01/11/2016  . Delirium tremens ([place]) [F10.231] 01/11/2016  . NSTEMI (non-ST elevated myocardial infarction) ([place]) [I21.4] 01/11/2016  . Sepsis due to Enterococcus with acute renal failure and metabolic encephalopathy ([place]) [A41.81, R65.20, N17.9, G93.41] 01/11/2016  . Chronic alcohol abuse [F10.10]   . CAD (coronary artery disease), native coronary artery [I25.10] 01/10/2016  . Fatty liver, alcoholic [G28.3] 66/29/4765  . Hypomagnesemia [E83.42] 04/03/2015  . Ataxia [R27.0]   . Acute respiratory failure ([place]) [J96.00] 09/05/2014  . Alcohol withdrawal, with delirium ([place]) [F10.231] 09/03/2014  . Unspecified cerebral artery occlusion with cerebral infarction [I63.50] 07/05/2014  . Alcohol abuse [F10.10] 07/02/2014  . Current tobacco use [Z72.0] 01/17/2014  . Hearing loss [H91.90] 11/25/2013  . Tinnitus of both ears [H93.13]  11/25/2013    Total Time spent with patient: 1 hour  Subjective:   Joshua Henson is a 71 y.o. male patient admitted with alcohol withdrawal.  HPI:   Per chart review, patient has a history of depression, anxiety and alcohol use disorder with alcohol cirrhosis. He was admitted on 11/22 in alcohol withdrawal. His last drink was 3 days prior to admission. UDS was positive for benzodiazpines and opiates. BAL was 118 on admission. His hospital course was complicated by left basilar pneumonia. He is receiving Seroquel 25 mg q am and 200 mg qhs (increased from 50 mg today) for agitation. He has been aggressive, agitated and confused for the past 2 days. He is receiving Ativan 2 mg TID as well as 2-3 mg q 1 hour PRN (x3 doses today) for alcohol withdrawal and is on a Clonidine protocol. He is receiving Percocet 5-325 mg q 6 hours PRN (x 2 doses today). Home medications include Wellubtrin SR 200 mg daily, Atarax 10 mg TID PRN, Ativan 1 mg BID PRN and Remeron 15 mg qhs. He has not received a controlled medication since May 2018 (Ativan 1 mg).   On interview, Joshua Henson appeared confused and did not appropriately answer some questions. He reports that he has felt confused. He was oriented to person and place but thought the date was 11/05/2017. He reports that he has been "stuck in this bed for 8 days." He asked about discharge so that he could "transfer over to [facility]."  He reports that he cannot do anything because he can barely walk. He denies AVH, SI or HI.He reports that his appetite and sleep have been okay. He had not eaten breakfast or lunch today but was attempting to eat lunch  following the interview.   Past Psychiatric History: Depression onset following divorce and diagnosis of Meniere's Disease. No history of suicide attempts.   Risk to Self: Is patient at risk for suicide?: No Risk to Others:  None. Denies HI.  Prior Inpatient Therapy:  Denies  Prior Outpatient Therapy:  [facility]   Past  Medical History:  Past Medical History:  Diagnosis Date  . Acute hepatic encephalopathy   . Alcoholic cirrhosis of liver without ascites ([place])   . Anxiety state   . CAD (coronary artery disease), native coronary artery 01/10/2016   3 vessel coronary calcification noted on CT scan 04/03/15    . Depression   . Fatty liver, alcoholic 9/38/1017  . Hypertension   . Increased ammonia level   . Insomnia   . NSTEMI (non-ST elevated myocardial infarction) ([place])   . Prostate cancer ([place]) 2010   External beam radiation (urol - Risa Grill, XRT Valere Dross)  . Stroke ([place])   . Tachycardia     Past Surgical History:  Procedure Laterality Date  . CARDIAC CATHETERIZATION N/A 01/15/2016   Procedure: Left Heart Cath and Coronary Angiography;  Surgeon: Burnell Blanks, MD;  Location: [facility];  Service: Cardiovascular;  Laterality: N/A;   Family History:  Family History  Problem Relation Age of Onset  . Cancer Mother        esophageal  . Hypertension Mother   . Diabetes Mother   . Cancer Father        prostate  . Heart disease Maternal Grandfather        MI  . Cancer Paternal Grandfather        prostate   Family Psychiatric  History: Unknown  Social History:  Social History   Substance and Sexual Activity  Alcohol Use Yes  . Alcohol/week: 33.6 oz  . Types: 84 Shots of liquor per week   Comment: last drink yesterday     Social History   Substance and Sexual Activity  Drug Use No   Comment: Cocaine intermittently    Social History   Socioeconomic History  . Marital status: Divorced    Spouse name: None  . Number of children: 3  . Years of education: 29  . Highest education level: None  Social Needs  . Financial resource strain: None  . Food insecurity - worry: None  . Food insecurity - inability: None  . Transportation needs - medical: None  . Transportation needs - non-medical: None  Occupational History  . Occupation: Chief Executive Officer  Tobacco Use  . Smoking status:  Current Every Day Smoker    Packs/day: 0.50    Years: 30.00    Pack years: 15.00    Types: Cigarettes  . Smokeless tobacco: Never Used  Substance and Sexual Activity  . Alcohol use: Yes    Alcohol/week: 33.6 oz    Types: 56 Shots of liquor per week    Comment: last drink yesterday  . Drug use: No    Comment: Cocaine intermittently  . Sexual activity: Yes  Other Topics Concern  . None  Social History Narrative   HSG, [employer], [place]. Married 22 yrs yrs - divorced. Twin boys - 73; 1 dtr - '94. Pension scheme manager.    Additional Social History: He is divorced and has 3 adult children. He lives with his girlfriend. He had a successful career as a Architectural technologist.      Allergies:   Allergies  Allergen Reactions  . Lisinopril  syncope  . Celebrex [Celecoxib] Rash    Labs:  Results for orders placed or performed during the hospital encounter of 11/09/17 (from the past 48 hour(s))  Glucose, capillary     Status: Abnormal   Collection Time: 11/18/17 12:10 PM  Result Value Ref Range   Glucose-Capillary 117 (H) 65 - 99 mg/dL   Comment 1 Notify RN   Glucose, capillary     Status: Abnormal   Collection Time: 11/18/17  4:31 PM  Result Value Ref Range   Glucose-Capillary 111 (H) 65 - 99 mg/dL  Glucose, capillary     Status: Abnormal   Collection Time: 11/18/17  9:25 PM  Result Value Ref Range   Glucose-Capillary 125 (H) 65 - 99 mg/dL  Glucose, capillary     Status: Abnormal   Collection Time: 11/19/17  1:28 AM  Result Value Ref Range   Glucose-Capillary 105 (H) 65 - 99 mg/dL  Glucose, capillary     Status: Abnormal   Collection Time: 11/19/17  3:36 AM  Result Value Ref Range   Glucose-Capillary 112 (H) 65 - 99 mg/dL  Magnesium     Status: None   Collection Time: 11/19/17  6:00 AM  Result Value Ref Range   Magnesium 1.9 1.7 - 2.4 mg/dL  Basic metabolic panel     Status: Abnormal   Collection Time: 11/19/17  6:00 AM  Result Value Ref Range   Sodium 137 135 -  145 mmol/L   Potassium 3.5 3.5 - 5.1 mmol/L    Comment: DELTA CHECK NOTED   Chloride 106 101 - 111 mmol/L   CO2 23 22 - 32 mmol/L   Glucose, Bld 105 (H) 65 - 99 mg/dL   BUN 15 6 - 20 mg/dL   Creatinine, Ser 0.76 0.61 - 1.24 mg/dL   Calcium 9.3 8.9 - 10.3 mg/dL   GFR calc non Af Amer >60 >60 mL/min   GFR calc Af Amer >60 >60 mL/min    Comment: (NOTE) The eGFR has been calculated using the CKD EPI equation. This calculation has not been validated in all clinical situations. eGFR's persistently <60 mL/min signify possible Chronic Kidney Disease.    Anion gap 8 5 - 15  CBC     Status: Abnormal   Collection Time: 11/19/17  6:00 AM  Result Value Ref Range   WBC 4.0 4.0 - 10.5 K/uL   RBC 3.55 (L) 4.22 - 5.81 MIL/uL   Hemoglobin 12.3 (L) 13.0 - 17.0 g/dL   HCT 37.0 (L) 39.0 - 52.0 %   MCV 104.2 (H) 78.0 - 100.0 fL   MCH 34.6 (H) 26.0 - 34.0 pg   MCHC 33.2 30.0 - 36.0 g/dL    Comment: CORRECTED FOR COLD AGGLUTININS   RDW 14.0 11.5 - 15.5 %   Platelets 269 150 - 400 K/uL  Glucose, capillary     Status: Abnormal   Collection Time: 11/19/17  7:21 AM  Result Value Ref Range   Glucose-Capillary 100 (H) 65 - 99 mg/dL   Comment 1 Notify RN   Glucose, capillary     Status: Abnormal   Collection Time: 11/19/17 12:02 PM  Result Value Ref Range   Glucose-Capillary 118 (H) 65 - 99 mg/dL   Comment 1 Notify RN   Glucose, capillary     Status: Abnormal   Collection Time: 11/19/17  4:41 PM  Result Value Ref Range   Glucose-Capillary 112 (H) 65 - 99 mg/dL  Glucose, capillary     Status: Abnormal  Collection Time: 11/19/17  9:18 PM  Result Value Ref Range   Glucose-Capillary 105 (H) 65 - 99 mg/dL   Comment 1 Notify RN   Glucose, capillary     Status: Abnormal   Collection Time: 11/20/17 12:02 AM  Result Value Ref Range   Glucose-Capillary 113 (H) 65 - 99 mg/dL  Glucose, capillary     Status: Abnormal   Collection Time: 11/20/17  4:54 AM  Result Value Ref Range   Glucose-Capillary 101  (H) 65 - 99 mg/dL  Magnesium     Status: None   Collection Time: 11/20/17  5:53 AM  Result Value Ref Range   Magnesium 1.9 1.7 - 2.4 mg/dL  Glucose, capillary     Status: Abnormal   Collection Time: 11/20/17  7:23 AM  Result Value Ref Range   Glucose-Capillary 100 (H) 65 - 99 mg/dL  Glucose, capillary     Status: None   Collection Time: 11/20/17 11:16 AM  Result Value Ref Range   Glucose-Capillary 89 65 - 99 mg/dL    Current Facility-Administered Medications  Medication Dose Route Frequency Provider Last Rate Last Dose  . aspirin chewable tablet 81 mg  81 mg Oral Daily Robbie Lis, MD   81 mg at 11/20/17 7412  . [START ON 11/21/2017] cloNIDine (CATAPRES) tablet 0.1 mg  0.1 mg Oral Daily Shahmehdi, Seyed A, MD      . folic acid (FOLVITE) tablet 1 mg  1 mg Oral Daily Shahmehdi, Seyed A, MD   1 mg at 11/20/17 1121  . hydrALAZINE (APRESOLINE) tablet 25 mg  25 mg Oral Q8H Robbie Lis, MD   25 mg at 11/20/17 0542  . hydrOXYzine (ATARAX/VISTARIL) tablet 10 mg  10 mg Oral TID PRN Eulogio Bear U, DO   10 mg at 11/19/17 1006  . insulin aspart (novoLOG) injection 0-9 Units  0-9 Units Subcutaneous Q4H Raylene Miyamoto, MD   1 Units at 11/18/17 [phone]  . lactulose (CHRONULAC) 10 GM/15ML solution 20 g  20 g Oral TID Robbie Lis, MD   20 g at 11/20/17 0849  . LORazepam (ATIVAN) injection 2-3 mg  2-3 mg Intravenous Q1H PRN Noe Gens L, NP   3 mg at 11/20/17 0733  . LORazepam (ATIVAN) tablet 2 mg  2 mg Oral Q8H Raylene Miyamoto, MD   2 mg at 11/20/17 0542  . MEDLINE mouth rinse  15 mL Mouth Rinse BID Robbie Lis, MD   15 mL at 11/17/17 2200  . metoprolol tartrate (LOPRESSOR) tablet 50 mg  50 mg Oral BID Skipper Cliche A, MD   50 mg at 11/20/17 0848  . multivitamin (PROSIGHT) tablet 1 tablet  1 tablet Oral Daily Robbie Lis, MD   1 tablet at 11/20/17 0848  . oxyCODONE-acetaminophen (PERCOCET/ROXICET) 5-325 MG per tablet 1 tablet  1 tablet Oral Q6H PRN Robbie Lis, MD   1 tablet  at 11/20/17 1121  . protein supplement (PREMIER PROTEIN) liquid  11 oz Oral Q24H Robbie Lis, MD   11 oz at 11/19/17 1415  . QUEtiapine (SEROQUEL) tablet 200 mg  200 mg Oral QHS Shahmehdi, Seyed A, MD      . QUEtiapine (SEROQUEL) tablet 25 mg  25 mg Oral QAC breakfast Shahmehdi, Seyed A, MD   25 mg at 11/20/17 0849  . [place]   Oral PRN Robbie Lis, MD      . tamsulosin [facility]) capsule 0.4 mg  0.4 mg Oral Daily  Eulogio Bear U, DO   0.4 mg at 11/20/17 0849  . thiamine (VITAMIN B-1) tablet 100 mg  100 mg Oral Daily Robbie Lis, MD   100 mg at 11/20/17 1610    Musculoskeletal: Strength & Muscle Tone: decreased due to physical deconditioning.  Gait & Station: Unable to assess due to lying in bed. Patient leans: N/A  Psychiatric Specialty Exam: Physical Exam  Nursing note and vitals reviewed. Constitutional: He appears well-developed and well-nourished.  HENT:  Head: Normocephalic and atraumatic.  Neck: Normal range of motion.  Respiratory: Effort normal.  Musculoskeletal: Normal range of motion.  Neurological: He is alert.  Oriented to person and hospital.  Psychiatric: He has a normal mood and affect. His behavior is normal. Cognition and memory are impaired.    Review of Systems  HENT: Positive for hearing loss.   Eyes: Positive for blurred vision (left eye).  Gastrointestinal: Positive for abdominal pain. Negative for constipation, diarrhea, nausea and vomiting.    Blood pressure 127/85, pulse 69, temperature 97.7 F (36.5 C), temperature source Oral, resp. rate 18, height 5' 7"  (1.702 m), weight 87.8 kg (193 lb 9 oz), SpO2 97 %.Body mass index is 30.32 kg/m.  General Appearance: Well Groomed, elderly, Caucasian male who is lying in bed in a hospital gown. NAD.   Eye Contact:  Good  Speech:  Normal Rate and Slurred  Volume:  Normal  Mood:  "I can't do anything. I can barely walk."   Affect:  Constricted  Thought Process:  Descriptions of  Associations: Tangential  Orientation:  Other:  Person and place.  Thought Content:  Illogical  Suicidal Thoughts:  No  Homicidal Thoughts:  No  Memory:  Immediate;   Poor Recent;   Fair Remote;   Fair  Judgement:  Impaired  Insight:  Poor  Psychomotor Activity:  Decreased  Concentration:  Concentration: Poor and Attention Span: Poor  Recall:  Poor  Fund of Knowledge:  Fair  Language:  Fair  Akathisia:  No  Handed:  Right  AIMS (if indicated):   N/A  Assets:  Housing Social Support  ADL's:  Currently requiring assistance.  Cognition:  Impaired secondary to medical condition.   Sleep:   Okay   Assessment:  Joshua Henson is a 71 y.o. male who was admitted with alcohol withdrawal. His hospital course has been complicated by prolonged withdrawal symptoms and and delirium. He has been agitated and aggressive towards staff. He is receiving Seroquel for agitation. His dose was increased to 200 mg today. He was pleasant on interview today. Recommend to continue PRN Seroquel for agitation since his mental status would be expected to wax and wane until delirium resolves.   Treatment Plan Summary: -Continue Seroquel 25 mg q am and 200 mg qhs. Start Seroquel 25 mg BID PRN for worsening agitation. -Recommend obtaining recent EKG in setting of regular antipsychotic use. QTc 461 on 11/22.  -Psychiatry will sign off on patient at this time. Please consult psychiatry again as needed.   Disposition: No evidence of imminent risk to self or others at present.   Patient does not meet criteria for psychiatric inpatient admission.  Faythe Dingwall, DO 11/20/2017 11:48 AM

## 2017-11-21 DIAGNOSIS — R4182 Altered mental status, unspecified: Secondary | ICD-10-CM

## 2017-11-21 LAB — GLUCOSE, CAPILLARY
GLUCOSE-CAPILLARY: 104 mg/dL — AB (ref 65–99)
Glucose-Capillary: 102 mg/dL — ABNORMAL HIGH (ref 65–99)
Glucose-Capillary: 99 mg/dL (ref 65–99)

## 2017-11-21 LAB — MAGNESIUM: MAGNESIUM: 2 mg/dL (ref 1.7–2.4)

## 2017-11-21 MED ORDER — QUETIAPINE FUMARATE 200 MG PO TABS: 200.0000 mg | ORAL_TABLET | Freq: Every day | ORAL | Status: DC

## 2017-11-21 MED ORDER — QUETIAPINE FUMARATE 25 MG PO TABS: 25.0000 mg | ORAL_TABLET | Freq: Every day | ORAL | Status: DC

## 2017-11-21 NOTE — Progress Notes (Signed)
Date: November 21, 2017 Chart review for discharge needs:  None found for case management. Patient has no questions concerning post hospital care. 

## 2017-11-21 NOTE — Progress Notes (Signed)
PT Cancellation Note  Patient Details Name: Joshua Henson MRN: [id number] DOB: 02/17/1946   Cancelled Treatment:     pt sleeping, RN stated pt was leaving today.  Pt has been evaluated with rec for SNF.     Rica Koyanagi  PTA [facility]  Acute  Rehab Pager      [phone]

## 2017-11-21 NOTE — Discharge Summary (Addendum)
Physician Discharge Summary  Joshua Henson:[id number] DOB: 1946-11-08 DOA: 11/09/2017  PCP: No primary care provider on file.  Admit date: 11/09/2017 Discharge date: 11/27/2017  Admitted From: home Disposition:  Home  Recommendations for Outpatient Follow-up:  1. Follow up with PCP in 1-2 weeks 2. Please obtain BMP/CBC in one week   Home Health:No Equipment/Devices:none  Discharge Condition:stable CODE STATUS:full Diet recommendation: Heart Healthy  Brief/Interim Summary: 71 year old male with medical history significant for alcohol cirrhosis, coronary artery disease, anxiety and depression, hypertension, history of stroke and prostate cancer. Patient presented to ED with alcohol withdrawal. Patient reported his last drink was about 3 days prior to admission. Initial alcohol level was 118. UDS was positive for benzos and opiates. Blood work was notable for hypokalemia, anion gap of 21, lactic acid of 8.04, MCV 99.7. Chest x-ray was concerning for left basilar opacity suspicious for pneumonia.     Discharge Diagnoses:  Principal Problem:   Altered mental status Active Problems:   Hypomagnesemia   Delirium tremens (HCC)   Essential hypertension   Alcohol withdrawal (HCC)   Alcoholic ketosis   Dehydration   LFT elevation  Acute alcohol intoxication/alcohol withdrawal/acute confusional state: He was admitted to the ICU critical care was consulted, due to severe agitation. Once he was more comfortable he was changed the Ativan protocol which he has been stable on. Psychiatry was consulted who recommended to continue Seroquel at night and in the morning.  Left basilar community-acquired pneumonia: He was started empirically on antibiotics which he completed in house.  Essential hypertension: He will continue Lasix and Coreg with potassium supplementations.  Microcytic anemia: Acute alcohol abuse stable.  Hypomagnesemia hypokalemia: Likely due to alcohol abuse these  were repleted.  Discharge Instructions  Discharge Instructions    Diet - low sodium heart healthy   Complete by:  As directed    Increase activity slowly   Complete by:  As directed      Allergies as of 11/21/2017      Reactions   Lisinopril    syncope   Celebrex [celecoxib] Rash      Medication List    STOP taking these medications   oxyCODONE 5 MG immediate release tablet Commonly known as:  Oxy IR/ROXICODONE     TAKE these medications   aspirin 81 MG EC tablet Take 1 tablet (81 mg total) by mouth daily.   atorvastatin 40 MG tablet Commonly known as:  LIPITOR Take 1 tablet (40 mg total) by mouth daily at 6 PM.   buPROPion 200 MG 12 hr tablet Commonly known as:  WELLBUTRIN SR Take 200 mg by mouth daily.   carvedilol 12.5 MG tablet Commonly known as:  COREG Take 1 tablet (12.5 mg total) by mouth 2 (two) times daily.   cholecalciferol 1000 units tablet Commonly known as:  VITAMIN D Take 1,000 Units by mouth 2 (two) times daily.   Co Q-10 200 MG Caps Take 200 mg by mouth daily.   Fish Oil 1000 MG Caps Take 1,000 mg by mouth 2 (two) times daily.   folic acid 1 MG tablet Commonly known as:  FOLVITE take 1 tablet by mouth once daily   hydrOXYzine 10 MG tablet Commonly known as:  ATARAX/VISTARIL Take 1 tablet (10 mg total) by mouth 3 (three) times daily as needed for anxiety.   LORazepam 1 MG tablet Commonly known as:  ATIVAN Take 1 tablet (1 mg total) by mouth 2 (two) times daily as needed for anxiety.   Magnesium  500 MG Caps Take 500 mg by mouth at bedtime.   mirtazapine 7.5 MG tablet Commonly known as:  REMERON take 1 tablet by mouth at bedtime What changed:    how much to take  how to take this  when to take this   nitroGLYCERIN 0.4 MG SL tablet Commonly known as:  NITROSTAT Place 1 tablet (0.4 mg total) under the tongue every 5 (five) minutes as needed for chest pain.   potassium chloride SA 20 MEQ tablet Commonly known as:   K-DUR,KLOR-CON take 2 tablets by mouth once daily What changed:    how much to take  how to take this  when to take this   QUEtiapine 200 MG tablet Commonly known as:  SEROQUEL Take 1 tablet (200 mg total) by mouth at bedtime.   QUEtiapine 25 MG tablet Commonly known as:  SEROQUEL Take 1 tablet (25 mg total) by mouth daily before breakfast.   senna-docusate 8.6-50 MG tablet Commonly known as:  Senokot-S Take 1 tablet by mouth at bedtime.   tamsulosin 0.4 MG Caps capsule Commonly known as:  FLOMAX take 1 capsule by mouth once daily   thiamine 100 MG tablet Commonly known as:  VITAMIN B-1 Take 100 mg by mouth daily.   triamterene-hydrochlorothiazide 37.5-25 MG capsule Commonly known as:  DYAZIDE Take 1 capsule by mouth daily.      Contact information for after-discharge care    [facility] .   Service:  Skilled Nursing Contact information: [address] [address] [phone]             Allergies  Allergen Reactions  . Lisinopril     syncope  . Celebrex [Celecoxib] Rash    Consultations: psyquiatry  Procedures/Studies: Dg Chest 2 View  Result Date: 11/09/2017 CLINICAL DATA:  Pt arrives POV, states he is withdrawing from ETOH, last drink 2-3 days ago, states he is a heavy drinker. Generalized pain and tremors.PT IS HOH Pt smells of ETOH hx hypertension - hx non ST evaluated myocardial in farction - hx prostate cancer 2010 - current smoker EXAM: CHEST  2 VIEW COMPARISON:  09/20/2016. FINDINGS: Cardiac silhouette is normal in size. No mediastinal or hilar masses. No evidence of adenopathy. Clear lungs.  No pleural effusion or pneumothorax. Skeletal structures are intact. IMPRESSION: No active cardiopulmonary disease. Electronically Signed   By: Lajean Manes M.D.   On: 11/09/2017 15:31   Dg Chest Port 1 View  Result Date: 11/15/2017 CLINICAL DATA:  Acute respiratory acidosis EXAM: PORTABLE CHEST  1 VIEW COMPARISON:  11/13/2017 FINDINGS: Endotracheal tube and NG tube removed. Slight improvement in bibasilar atelectasis. Small left effusion unchanged. Negative for edema. IMPRESSION: Endotracheal tube and NG tube removed. Mild improvement in bibasilar atelectasis. Small left effusion. Electronically Signed   By: Franchot Gallo M.D.   On: 11/15/2017 07:05   Dg Chest Port 1 View  Result Date: 11/13/2017 CLINICAL DATA:  Intubation. EXAM: PORTABLE CHEST 1 VIEW COMPARISON:  11/12/2017 FINDINGS: The endotracheal tube terminates near the inferior margin of the clavicular heads, well above the carina. Enteric tube courses into the upper abdomen with tip not imaged. The cardiac silhouette is mildly enlarged. There is increasing airspace opacity in the left lung base. Right lung base opacity is stable to slightly improved and has a predominantly curvilinear configuration suggesting atelectasis. No sizable pleural effusion or pneumothorax is identified. IMPRESSION: 1. Increasing left basilar airspace opacity concerning for pneumonia. 2. Mild right basilar  opacity suggestive of atelectasis. Electronically Signed   By: Logan Bores M.D.   On: 11/13/2017 06:50   Dg Chest Port 1 View  Result Date: 11/12/2017 CLINICAL DATA:  71 year old male with concern for aspiration pneumonia. EXAM: PORTABLE CHEST 1 VIEW COMPARISON:  11/11/2017. FINDINGS: Endotracheal tube terminates approximately 5.5 cm above the level of the carina. An enteric tube courses below the diaphragm beyond the edge of the film. There are low lung volumes with bronchovascular crowding. Cardiomediastinal silhouette is stable in size and configuration. Slightly increased opacity at the medial right lung base is consistent with aspiration pneumonia in the proper clinical setting. No sizable pleural effusions. No pneumothorax. No acute osseous abnormality. IMPRESSION: 1. Medial right lung opacity consistent with aspiration pneumonia in the proper clinical  setting. 2. Life-support devices is stated. Electronically Signed   By: Kristopher Oppenheim M.D.   On: 11/12/2017 10:15   Dg Chest Port 1 View  Result Date: 11/11/2017 CLINICAL DATA:  71 year old male for evaluation of endotracheal and enteric tube placement. EXAM: PORTABLE CHEST 1 VIEW COMPARISON:  11/11/2017 FINDINGS: There has been interval placement of an endotracheal tube which terminates approximately 7.7 cm above the level of the carina. An enteric tube courses below the diaphragm beyond the edge of the film. Cardiomediastinal silhouette is partially visualized. Visualized portions of the lung are clear. No acute osseous abnormalities. IMPRESSION: Interval intubation with endotracheal tube approximately 7.7 cm above the level of the carina. Consider advancing further and reimaging. Electronically Signed   By: Kristopher Oppenheim M.D.   On: 11/11/2017 13:54   Dg Chest Port 1 View  Result Date: 11/11/2017 CLINICAL DATA:  Acute respiratory failure. EXAM: PORTABLE CHEST 1 VIEW COMPARISON:  Radiographs of November 09, 2017. FINDINGS: Stable cardiomediastinal silhouette. No pneumothorax or pleural effusion is noted. Both lungs are clear. The visualized skeletal structures are unremarkable. IMPRESSION: No acute cardiopulmonary abnormality seen. Electronically Signed   By: Marijo Conception, M.D.   On: 11/11/2017 10:50   Dg Abd Portable 1v  Result Date: 11/11/2017 CLINICAL DATA:  71 year old male for evaluation of enteric tube placement. EXAM: PORTABLE ABDOMEN - 1 VIEW COMPARISON:  None. FINDINGS: An enteric tube courses below the diaphragm and terminates within the gastric body. Visualized portions of bowel gas pattern are normal. IMPRESSION: Enteric tube courses below the diaphragm and terminates at the level of the gastric body. Electronically Signed   By: Kristopher Oppenheim M.D.   On: 11/11/2017 13:54   US Abdomen Limited Ruq  Result Date: 11/10/2017 CLINICAL DATA:  Elevated LFTs. EXAM: ULTRASOUND ABDOMEN  LIMITED RIGHT UPPER QUADRANT COMPARISON:  CT 02/21/2017 . FINDINGS: Gallbladder: No gallstones or wall thickening visualized. No sonographic Murphy sign noted by sonographer. Common bile duct: Diameter: 3.1 mm Liver: Increased echogenicity consistent with fatty infiltration and/or hepatocellular disease. No focal hepatic abnormality identified. Portal vein is patent on color Doppler imaging with normal direction of blood flow towards the liver. IMPRESSION: 1. Increase echogenicity consistent fatty infiltration and/or hepatocellular disease. 2. No gallstones or biliary distention. Electronically Signed   By: Marcello Moores  Register   On: 11/10/2017 14:40    Subjective: No new complaints.  Discharge Exam: Vitals:   11/21/17 0937 11/21/17 1329  BP: 127/85 (!) 151/85  Pulse: (!) 103 67  Resp:  18  Temp:  98 F (36.7 C)  SpO2:  97%   Vitals:   11/20/17 2008 11/21/17 0615 11/21/17 0937 11/21/17 1329  BP: (!) 144/103 (!) 133/94 127/85 (!) 151/85  Pulse: 79 72 (!)  103 67  Resp: 20 15  18   Temp: 98.4 F (36.9 C) 98.4 F (36.9 C)  98 F (36.7 C)  TempSrc: Oral Oral  Oral  SpO2: 95% 95%  97%  Weight:      Height:        General: Pt is alert, awake, not in acute distress Cardiovascular: RRR, S1/S2 +, no rubs, no gallops Respiratory: CTA bilaterally, no wheezing, no rhonchi Abdominal: Soft, NT, ND, bowel sounds + Extremities: no edema, no cyanosis    The results of significant diagnostics from this hospitalization (including imaging, microbiology, ancillary and laboratory) are listed below for reference.     Microbiology: No results found for this or any previous visit (from the past 240 hour(s)).   Labs: BNP (last 3 results) No results for input(s): BNP in the last 8760 hours. Basic Metabolic Panel: Recent Labs  Lab 11/21/17 0554  MG 2.0   Liver Function Tests: No results for input(s): AST, ALT, ALKPHOS, BILITOT, PROT, ALBUMIN in the last 168 hours. No results for input(s):  LIPASE, AMYLASE in the last 168 hours. No results for input(s): AMMONIA in the last 168 hours. CBC: No results for input(s): WBC, NEUTROABS, HGB, HCT, MCV, PLT in the last 168 hours. Cardiac Enzymes: No results for input(s): CKTOTAL, CKMB, CKMBINDEX, TROPONINI in the last 168 hours. BNP: Invalid input(s): POCBNP CBG: Recent Labs  Lab 11/20/17 2011 11/20/17 2328 11/21/17 0559 11/21/17 0743 11/21/17 1151  GLUCAP 108* 98 102* 99 104*   D-Dimer No results for input(s): DDIMER in the last 72 hours. Hgb A1c No results for input(s): HGBA1C in the last 72 hours. Lipid Profile No results for input(s): CHOL, HDL, LDLCALC, TRIG, CHOLHDL, LDLDIRECT in the last 72 hours. Thyroid function studies No results for input(s): TSH, T4TOTAL, T3FREE, THYROIDAB in the last 72 hours.  Invalid input(s): FREET3 Anemia work up No results for input(s): VITAMINB12, FOLATE, FERRITIN, TIBC, IRON, RETICCTPCT in the last 72 hours. Urinalysis    Component Value Date/Time   COLORURINE YELLOW 10/21/2016 1835   APPEARANCEUR CLEAR 10/21/2016 1835   LABSPEC 1.024 10/21/2016 1835   PHURINE 6.5 10/21/2016 1835   GLUCOSEU NEGATIVE 10/21/2016 1835   HGBUR NEGATIVE 10/21/2016 [place] NEGATIVE 10/21/2016 1835   KETONESUR NEGATIVE 10/21/2016 1835   PROTEINUR NEGATIVE 10/21/2016 1835   UROBILINOGEN 2.0 (H) 04/02/2015 2013   NITRITE NEGATIVE 10/21/2016 1835   LEUKOCYTESUR NEGATIVE 10/21/2016 1835   Sepsis Labs Invalid input(s): PROCALCITONIN,  WBC,  LACTICIDVEN Microbiology No results found for this or any previous visit (from the past 240 hour(s)).   Time coordinating discharge: Over 30 minutes  SIGNED:   Charlynne Cousins, MD  [facility] 11/27/2017, 2:08 PM Pager   If 7PM-7AM, please contact night-coverage [email or web address] Password [email or web address]

## 2017-11-21 NOTE — Clinical Social Work Placement (Signed)
     11:01 AM  Patient and family chose bed at [facility].  LCSW confirmed bed with facility.  Patient will transport by [facility].  LCSW sent dc docs via [facility] report number [id number], patient can transport this afternoon.   CLINICAL SOCIAL WORK PLACEMENT  NOTE  Date:  11/21/2017  Patient Details  Name: Joshua Henson MRN: [id number] Date of Birth: 06/16/1946  Clinical Social Work is seeking post-discharge placement for this patient at the [place] level of care (*CSW will initial, date and re-position this form in  chart as items are completed):  Yes   Patient/family provided with [place] Work Department's list of facilities offering this level of care within the geographic area requested by the patient (or if unable, by the patient's family).  Yes   Patient/family informed of their freedom to choose among providers that offer the needed level of care, that participate in Medicare, Medicaid or managed care program needed by the patient, have an available bed and are willing to accept the patient.  Yes   Patient/family informed of [facility]'s ownership interest in [facility] and [facility], as well as of the fact that they are under no obligation to receive care at these facilities.  PASRR submitted to EDS on       PASRR number received on 11/20/17     Existing PASRR number confirmed on 11/20/17     FL2 transmitted to all facilities in geographic area requested by pt/family on 11/20/17     FL2 transmitted to all facilities within larger geographic area on       Patient informed that his/her managed care company has contracts with or will negotiate with certain facilities, including the following:        Yes   Patient/family informed of bed offers received.  Patient chooses bed at [facility]     Physician recommends and patient chooses bed at [facility]    Patient to be transferred to  [facility] on 11/21/17.  Patient to be transferred to facility by [facility]     Patient family notified on 11/21/17 of transfer.  Name of family member notified:  Melissa, Spouse     PHYSICIAN       Additional Comment:    _______________________________________________ Servando Snare, LCSW 11/21/2017, 11:00 AM

## 2017-11-21 NOTE — Progress Notes (Signed)
  Speech Language Pathology Treatment: Dysphagia  Patient Details Name: Joshua Henson MRN: [id number] DOB: 22-Aug-1946 Today's Date: 11/21/2017 Time: 5997-7414 SLP Time Calculation (min) (ACUTE ONLY): 17 min  Assessment / Plan / Recommendation Clinical Impression  Pt today seen to assure tolerance of po diet - advanced to regular/nectar.  RN reports pt was given thin orange juice this am as he needed to take his medications and she did not have thickener.  She admits he was coughing with thin much more prominently than with nectar.  Observed pt consuming nectar thick coffee and orange juice.  Delayed cough observed which may be indicative of some airway compromise/infiltration.  However upon reviewed of pt's vitals -WBC and intake- all is stable therefore he appears to be tolerating po intake well.  Recommend to continue modified diet at SNF.  Educated pt to findings of prior MBS continued increased difficulty with thin and likely aspiration- reviewed aspiration mitigation strategies providing pt with written instructions.  No SLP follow up indicated acutely.  Thanks.  HPI HPI: 71 yo male admitted to [facility] with AMS, ETOH w/d on 11/09/17- required intubation 11/24-11-28.  Swallow evaluation ordered.  Pt known to this SLP from prior hospitalization and was diagnosed with mild oropharyngeal dysphagia with sensorimotor deficit with pharyngeal residuals pt did not sense.        SLP Plan  No acute SLP follow up indicated.  Recommend follow up at [facility] for dysphagia management and continue regular/nectar diet.       Recommendations  Diet recommendations: Regular;Nectar-thick liquid Liquids provided via: Cup Supervision: Patient able to self feed;Intermittent supervision to cue for compensatory strategies Compensations: Slow rate;Small sips/bites;Multiple dry swallows after each bite/sip Postural Changes and/or Swallow Maneuvers: Seated upright 90 degrees;Upright 30-60 min after meal                Oral Care Recommendations: Oral care BID Follow up Recommendations: Skilled Nursing facility SLP Visit Diagnosis: Dysphagia, oropharyngeal phase (R13.12) Plan: Continue with current plan of care       GO               Joshua Henson, [facility] SLP [phone]  Macario Golds 11/21/2017, 9:47 AM

## 2017-11-21 NOTE — Progress Notes (Signed)
Left number at [facility]. Patient will be transported via [place]. Patient's g/f took all of the belongings home including Ipad, cellphone, glasses, hearing aids.

## 2017-11-21 NOTE — Progress Notes (Signed)
Report given to [facility] at [facility]. No questions or concerns at this time.

## 2017-11-30 DIAGNOSIS — R451 Restlessness and agitation: Secondary | ICD-10-CM | POA: Diagnosis not present

## 2017-11-30 DIAGNOSIS — F419 Anxiety disorder, unspecified: Secondary | ICD-10-CM | POA: Diagnosis not present

## 2017-12-04 DIAGNOSIS — R410 Disorientation, unspecified: Secondary | ICD-10-CM | POA: Diagnosis not present

## 2017-12-04 DIAGNOSIS — R945 Abnormal results of liver function studies: Secondary | ICD-10-CM | POA: Diagnosis not present

## 2017-12-04 DIAGNOSIS — R748 Abnormal levels of other serum enzymes: Secondary | ICD-10-CM | POA: Diagnosis not present

## 2017-12-15 DIAGNOSIS — F329 Major depressive disorder, single episode, unspecified: Secondary | ICD-10-CM | POA: Diagnosis not present

## 2017-12-15 DIAGNOSIS — E785 Hyperlipidemia, unspecified: Secondary | ICD-10-CM | POA: Diagnosis not present

## 2017-12-15 DIAGNOSIS — K852 Alcohol induced acute pancreatitis without necrosis or infection: Secondary | ICD-10-CM | POA: Diagnosis not present

## 2017-12-15 DIAGNOSIS — K859 Acute pancreatitis without necrosis or infection, unspecified: Secondary | ICD-10-CM | POA: Diagnosis not present

## 2017-12-15 DIAGNOSIS — F419 Anxiety disorder, unspecified: Secondary | ICD-10-CM | POA: Diagnosis not present

## 2017-12-15 DIAGNOSIS — G8929 Other chronic pain: Secondary | ICD-10-CM | POA: Diagnosis not present

## 2017-12-15 DIAGNOSIS — I2510 Atherosclerotic heart disease of native coronary artery without angina pectoris: Secondary | ICD-10-CM | POA: Diagnosis not present

## 2017-12-15 DIAGNOSIS — I10 Essential (primary) hypertension: Secondary | ICD-10-CM | POA: Diagnosis not present

## 2017-12-15 DIAGNOSIS — R41841 Cognitive communication deficit: Secondary | ICD-10-CM | POA: Diagnosis not present

## 2017-12-15 DIAGNOSIS — F339 Major depressive disorder, recurrent, unspecified: Secondary | ICD-10-CM | POA: Diagnosis not present

## 2017-12-20 DIAGNOSIS — K859 Acute pancreatitis without necrosis or infection, unspecified: Secondary | ICD-10-CM | POA: Diagnosis not present

## 2017-12-20 DIAGNOSIS — R4189 Other symptoms and signs involving cognitive functions and awareness: Secondary | ICD-10-CM | POA: Diagnosis not present

## 2017-12-20 DIAGNOSIS — G4700 Insomnia, unspecified: Secondary | ICD-10-CM | POA: Diagnosis not present

## 2017-12-23 DIAGNOSIS — H8109 Meniere's disease, unspecified ear: Secondary | ICD-10-CM | POA: Diagnosis not present

## 2017-12-23 DIAGNOSIS — K7030 Alcoholic cirrhosis of liver without ascites: Secondary | ICD-10-CM | POA: Diagnosis not present

## 2017-12-23 DIAGNOSIS — M6281 Muscle weakness (generalized): Secondary | ICD-10-CM | POA: Diagnosis not present

## 2017-12-23 DIAGNOSIS — I2510 Atherosclerotic heart disease of native coronary artery without angina pectoris: Secondary | ICD-10-CM | POA: Diagnosis not present

## 2018-01-16 DIAGNOSIS — R339 Retention of urine, unspecified: Secondary | ICD-10-CM | POA: Diagnosis not present

## 2018-01-16 DIAGNOSIS — Z720 Tobacco use: Secondary | ICD-10-CM | POA: Diagnosis not present

## 2018-01-16 DIAGNOSIS — Z0000 Encounter for general adult medical examination without abnormal findings: Secondary | ICD-10-CM | POA: Diagnosis not present

## 2018-01-16 DIAGNOSIS — G4700 Insomnia, unspecified: Secondary | ICD-10-CM | POA: Diagnosis not present

## 2018-01-16 DIAGNOSIS — I10 Essential (primary) hypertension: Secondary | ICD-10-CM | POA: Diagnosis not present

## 2018-01-16 DIAGNOSIS — F341 Dysthymic disorder: Secondary | ICD-10-CM | POA: Diagnosis not present

## 2018-01-24 DIAGNOSIS — F17290 Nicotine dependence, other tobacco product, uncomplicated: Secondary | ICD-10-CM | POA: Diagnosis not present

## 2018-01-24 DIAGNOSIS — I10 Essential (primary) hypertension: Secondary | ICD-10-CM | POA: Diagnosis not present

## 2018-04-18 DIAGNOSIS — Z0000 Encounter for general adult medical examination without abnormal findings: Secondary | ICD-10-CM | POA: Diagnosis not present

## 2018-04-18 DIAGNOSIS — R339 Retention of urine, unspecified: Secondary | ICD-10-CM | POA: Diagnosis not present

## 2018-04-18 DIAGNOSIS — F341 Dysthymic disorder: Secondary | ICD-10-CM | POA: Diagnosis not present

## 2018-04-18 DIAGNOSIS — I10 Essential (primary) hypertension: Secondary | ICD-10-CM | POA: Diagnosis not present

## 2018-04-18 DIAGNOSIS — E876 Hypokalemia: Secondary | ICD-10-CM | POA: Diagnosis not present

## 2018-05-08 DIAGNOSIS — H401121 Primary open-angle glaucoma, left eye, mild stage: Secondary | ICD-10-CM | POA: Diagnosis not present

## 2018-06-05 DIAGNOSIS — I10 Essential (primary) hypertension: Secondary | ICD-10-CM | POA: Diagnosis not present

## 2018-06-05 DIAGNOSIS — Z8546 Personal history of malignant neoplasm of prostate: Secondary | ICD-10-CM | POA: Diagnosis not present

## 2018-06-05 DIAGNOSIS — F419 Anxiety disorder, unspecified: Secondary | ICD-10-CM | POA: Diagnosis not present

## 2018-06-05 DIAGNOSIS — R339 Retention of urine, unspecified: Secondary | ICD-10-CM | POA: Diagnosis not present

## 2018-06-05 DIAGNOSIS — F341 Dysthymic disorder: Secondary | ICD-10-CM | POA: Diagnosis not present

## 2018-06-30 ENCOUNTER — Emergency Department (HOSPITAL_COMMUNITY): Payer: Medicare Other

## 2018-06-30 ENCOUNTER — Encounter (HOSPITAL_COMMUNITY): Payer: Self-pay | Admitting: Emergency Medicine

## 2018-06-30 ENCOUNTER — Inpatient Hospital Stay (HOSPITAL_COMMUNITY)
Admission: EM | Admit: 2018-06-30 | Discharge: 2018-07-03 | DRG: 281 | Disposition: A | Discharge status: Home / Self Care | Payer: Medicare Other | Attending: Internal Medicine | Admitting: Internal Medicine

## 2018-06-30 DIAGNOSIS — Z8546 Personal history of malignant neoplasm of prostate: Secondary | ICD-10-CM

## 2018-06-30 DIAGNOSIS — Z7982 Long term (current) use of aspirin: Secondary | ICD-10-CM

## 2018-06-30 DIAGNOSIS — K700 Alcoholic fatty liver: Secondary | ICD-10-CM | POA: Diagnosis present

## 2018-06-30 DIAGNOSIS — Z79899 Other long term (current) drug therapy: Secondary | ICD-10-CM

## 2018-06-30 DIAGNOSIS — Z800 Family history of malignant neoplasm of digestive organs: Secondary | ICD-10-CM

## 2018-06-30 DIAGNOSIS — Z9181 History of falling: Secondary | ICD-10-CM

## 2018-06-30 DIAGNOSIS — Z8673 Personal history of transient ischemic attack (TIA), and cerebral infarction without residual deficits: Secondary | ICD-10-CM

## 2018-06-30 DIAGNOSIS — N400 Enlarged prostate without lower urinary tract symptoms: Secondary | ICD-10-CM | POA: Diagnosis present

## 2018-06-30 DIAGNOSIS — I252 Old myocardial infarction: Secondary | ICD-10-CM | POA: Diagnosis not present

## 2018-06-30 DIAGNOSIS — R112 Nausea with vomiting, unspecified: Secondary | ICD-10-CM | POA: Diagnosis not present

## 2018-06-30 DIAGNOSIS — R4182 Altered mental status, unspecified: Secondary | ICD-10-CM | POA: Diagnosis not present

## 2018-06-30 DIAGNOSIS — E875 Hyperkalemia: Secondary | ICD-10-CM | POA: Diagnosis present

## 2018-06-30 DIAGNOSIS — E785 Hyperlipidemia, unspecified: Secondary | ICD-10-CM | POA: Diagnosis present

## 2018-06-30 DIAGNOSIS — I21A1 Myocardial infarction type 2: Principal | ICD-10-CM | POA: Diagnosis present

## 2018-06-30 DIAGNOSIS — N183 Chronic kidney disease, stage 3 (moderate): Secondary | ICD-10-CM | POA: Diagnosis present

## 2018-06-30 DIAGNOSIS — N179 Acute kidney failure, unspecified: Secondary | ICD-10-CM | POA: Diagnosis not present

## 2018-06-30 DIAGNOSIS — F329 Major depressive disorder, single episode, unspecified: Secondary | ICD-10-CM | POA: Diagnosis present

## 2018-06-30 DIAGNOSIS — R748 Abnormal levels of other serum enzymes: Secondary | ICD-10-CM | POA: Diagnosis not present

## 2018-06-30 DIAGNOSIS — I10 Essential (primary) hypertension: Secondary | ICD-10-CM | POA: Diagnosis not present

## 2018-06-30 DIAGNOSIS — K5909 Other constipation: Secondary | ICD-10-CM | POA: Diagnosis present

## 2018-06-30 DIAGNOSIS — Z888 Allergy status to other drugs, medicaments and biological substances status: Secondary | ICD-10-CM

## 2018-06-30 DIAGNOSIS — H9190 Unspecified hearing loss, unspecified ear: Secondary | ICD-10-CM | POA: Diagnosis present

## 2018-06-30 DIAGNOSIS — F10239 Alcohol dependence with withdrawal, unspecified: Secondary | ICD-10-CM | POA: Diagnosis present

## 2018-06-30 DIAGNOSIS — I2510 Atherosclerotic heart disease of native coronary artery without angina pectoris: Secondary | ICD-10-CM | POA: Diagnosis present

## 2018-06-30 DIAGNOSIS — I5032 Chronic diastolic (congestive) heart failure: Secondary | ICD-10-CM | POA: Diagnosis present

## 2018-06-30 DIAGNOSIS — Z8249 Family history of ischemic heart disease and other diseases of the circulatory system: Secondary | ICD-10-CM | POA: Diagnosis not present

## 2018-06-30 DIAGNOSIS — R110 Nausea: Secondary | ICD-10-CM | POA: Diagnosis not present

## 2018-06-30 DIAGNOSIS — F411 Generalized anxiety disorder: Secondary | ICD-10-CM | POA: Diagnosis present

## 2018-06-30 DIAGNOSIS — R42 Dizziness and giddiness: Secondary | ICD-10-CM | POA: Diagnosis not present

## 2018-06-30 DIAGNOSIS — R531 Weakness: Secondary | ICD-10-CM | POA: Diagnosis not present

## 2018-06-30 DIAGNOSIS — R7989 Other specified abnormal findings of blood chemistry: Secondary | ICD-10-CM | POA: Diagnosis present

## 2018-06-30 DIAGNOSIS — K7030 Alcoholic cirrhosis of liver without ascites: Secondary | ICD-10-CM | POA: Diagnosis present

## 2018-06-30 DIAGNOSIS — R079 Chest pain, unspecified: Secondary | ICD-10-CM | POA: Diagnosis not present

## 2018-06-30 DIAGNOSIS — I130 Hypertensive heart and chronic kidney disease with heart failure and stage 1 through stage 4 chronic kidney disease, or unspecified chronic kidney disease: Secondary | ICD-10-CM | POA: Diagnosis present

## 2018-06-30 DIAGNOSIS — G4700 Insomnia, unspecified: Secondary | ICD-10-CM | POA: Diagnosis present

## 2018-06-30 DIAGNOSIS — Z8042 Family history of malignant neoplasm of prostate: Secondary | ICD-10-CM | POA: Diagnosis not present

## 2018-06-30 DIAGNOSIS — Z833 Family history of diabetes mellitus: Secondary | ICD-10-CM | POA: Diagnosis not present

## 2018-06-30 DIAGNOSIS — R778 Other specified abnormalities of plasma proteins: Secondary | ICD-10-CM

## 2018-06-30 DIAGNOSIS — Z923 Personal history of irradiation: Secondary | ICD-10-CM | POA: Diagnosis not present

## 2018-06-30 DIAGNOSIS — R5383 Other fatigue: Secondary | ICD-10-CM | POA: Diagnosis not present

## 2018-06-30 DIAGNOSIS — R0902 Hypoxemia: Secondary | ICD-10-CM | POA: Diagnosis not present

## 2018-06-30 DIAGNOSIS — F17210 Nicotine dependence, cigarettes, uncomplicated: Secondary | ICD-10-CM | POA: Diagnosis present

## 2018-06-30 DIAGNOSIS — R000 Tachycardia, unspecified: Secondary | ICD-10-CM | POA: Diagnosis not present

## 2018-06-30 LAB — CBC WITH DIFFERENTIAL/PLATELET
BASOS ABS: 0 10*3/uL (ref 0.0–0.1)
BASOS PCT: 0 %
EOS PCT: 0 %
Eosinophils Absolute: 0 10*3/uL (ref 0.0–0.7)
HCT: 46.5 % (ref 39.0–52.0)
Hemoglobin: 16.9 g/dL (ref 13.0–17.0)
LYMPHS PCT: 7 %
Lymphs Abs: 0.8 10*3/uL (ref 0.7–4.0)
MCH: 34.2 pg — ABNORMAL HIGH (ref 26.0–34.0)
MCHC: 36.3 g/dL — ABNORMAL HIGH (ref 30.0–36.0)
MCV: 94.1 fL (ref 78.0–100.0)
MONO ABS: 0.4 10*3/uL (ref 0.1–1.0)
Monocytes Relative: 4 %
Neutro Abs: 10.3 10*3/uL — ABNORMAL HIGH (ref 1.7–7.7)
Neutrophils Relative %: 89 %
PLATELETS: 295 10*3/uL (ref 150–400)
RBC: 4.94 MIL/uL (ref 4.22–5.81)
RDW: 15.4 % (ref 11.5–15.5)
WBC: 11.5 10*3/uL — AB (ref 4.0–10.5)

## 2018-06-30 LAB — COMPREHENSIVE METABOLIC PANEL
ALK PHOS: 73 U/L (ref 38–126)
ALT: 33 U/L (ref 0–44)
ANION GAP: 22 — AB (ref 5–15)
AST: 41 U/L (ref 15–41)
Albumin: 4.8 g/dL (ref 3.5–5.0)
BUN: 36 mg/dL — ABNORMAL HIGH (ref 8–23)
CALCIUM: 10.4 mg/dL — AB (ref 8.9–10.3)
CHLORIDE: 102 mmol/L (ref 98–111)
CO2: 20 mmol/L — AB (ref 22–32)
CREATININE: 1.57 mg/dL — AB (ref 0.61–1.24)
GFR, EST AFRICAN AMERICAN: 50 mL/min — AB (ref >60)
GFR, EST NON AFRICAN AMERICAN: 43 mL/min — AB (ref >60)
Glucose, Bld: 170 mg/dL — ABNORMAL HIGH (ref 70–99)
Potassium: 5.4 mmol/L — ABNORMAL HIGH (ref 3.5–5.1)
SODIUM: 144 mmol/L (ref 135–145)
Total Bilirubin: 2 mg/dL — ABNORMAL HIGH (ref 0.3–1.2)
Total Protein: 8.4 g/dL — ABNORMAL HIGH (ref 6.5–8.1)

## 2018-06-30 LAB — TROPONIN I: TROPONIN I: 0.72 ng/mL — AB (ref <0.03)

## 2018-06-30 LAB — AMMONIA: Ammonia: 34 umol/L (ref 9–35)

## 2018-06-30 LAB — PROTIME-INR
INR: 1.08
Prothrombin Time: 13.9 seconds (ref 11.4–15.2)

## 2018-06-30 LAB — LIPASE, BLOOD: LIPASE: 43 U/L (ref 11–51)

## 2018-06-30 LAB — APTT: aPTT: 29 seconds (ref 24–36)

## 2018-06-30 MED ORDER — ONDANSETRON HCL 4 MG/2ML IJ SOLN
4.0000 mg | Freq: Four times a day (QID) | INTRAMUSCULAR | Status: DC | PRN
  Administered 2018-07-01: 4 mg via INTRAVENOUS
  Filled 2018-06-30: qty 2

## 2018-06-30 MED ORDER — ONDANSETRON HCL 4 MG/2ML IJ SOLN
4.0000 mg | Freq: Once | INTRAMUSCULAR | Status: AC
  Administered 2018-06-30: 4 mg via INTRAVENOUS
  Filled 2018-06-30: qty 2

## 2018-06-30 MED ORDER — HEPARIN BOLUS VIA INFUSION
4000.0000 [IU] | Freq: Once | INTRAVENOUS | Status: AC
  Administered 2018-06-30: 4000 [IU] via INTRAVENOUS
  Filled 2018-06-30: qty 4000

## 2018-06-30 MED ORDER — LORAZEPAM 2 MG/ML IJ SOLN
1.0000 mg | Freq: Once | INTRAMUSCULAR | Status: AC
  Administered 2018-06-30: 1 mg via INTRAVENOUS
  Filled 2018-06-30: qty 1

## 2018-06-30 MED ORDER — NITROGLYCERIN 2 % TD OINT
0.5000 [in_us] | TOPICAL_OINTMENT | Freq: Three times a day (TID) | TRANSDERMAL | Status: DC
  Administered 2018-07-01 – 2018-07-02 (×6): 0.5 [in_us] via TOPICAL
  Filled 2018-06-30: qty 1
  Filled 2018-06-30: qty 30

## 2018-06-30 MED ORDER — SODIUM CHLORIDE 0.9 % IV BOLUS
1000.0000 mL | Freq: Once | INTRAVENOUS | Status: AC
  Administered 2018-06-30: 1000 mL via INTRAVENOUS

## 2018-06-30 MED ORDER — ASPIRIN 81 MG PO CHEW
324.0000 mg | CHEWABLE_TABLET | Freq: Once | ORAL | Status: AC
  Administered 2018-06-30: 324 mg via ORAL
  Filled 2018-06-30: qty 4

## 2018-06-30 MED ORDER — HEPARIN (PORCINE) IN NACL 100-0.45 UNIT/ML-% IJ SOLN
1000.0000 [IU]/h | INTRAMUSCULAR | Status: DC
  Administered 2018-06-30: 1000 [IU]/h via INTRAVENOUS
  Filled 2018-06-30: qty 250

## 2018-06-30 MED ORDER — PROMETHAZINE HCL 25 MG/ML IJ SOLN
12.5000 mg | Freq: Once | INTRAMUSCULAR | Status: AC
  Administered 2018-06-30: 12.5 mg via INTRAVENOUS
  Filled 2018-06-30: qty 1

## 2018-06-30 NOTE — ED Provider Notes (Addendum)
[facility] DEPT Provider Note   CSN: [id number] Arrival date & time: 06/30/18  1910     History   Chief Complaint Chief Complaint  Patient presents with  . Fatigue    HPI ADGER CANTERA is a 72 y.o. male.  HPI  72 year old male with a history of alcoholic liver disease and previous alcohol withdrawal presents with vomiting and dizziness.  He states that he started vomiting in the morning around 3 AM on 7/12.  Has had multiple episodes of emesis.  He feels nauseated.  He was given Zofran by EMS.  He feels lightheaded all the time but significantly worse when standing.  He feels short of breath.  There is no chest pain or abdominal pain.  He has not had a bowel movement in about 2 days.  He states he drank alcohol, one drink, the night before this started.  He states he used to have chronic alcoholism but he is drinking much less.  Has had some hot and cold chills but no known fever.  No urinary symptoms. No headache.  On exam he is noted to have a tremor which she states is new since this has started.  Past Medical History:  Diagnosis Date  . Acute hepatic encephalopathy   . Alcoholic cirrhosis of liver without ascites ([place])   . Anxiety state   . CAD (coronary artery disease), native coronary artery 01/10/2016   3 vessel coronary calcification noted on CT scan 04/03/15    . Depression   . Fatty liver, alcoholic 0/53/9767  . Hypertension   . Increased ammonia level   . Insomnia   . NSTEMI (non-ST elevated myocardial infarction) ([place])   . Prostate cancer ([place]) 2010   External beam radiation (urol - Risa Grill, XRT Valere Dross)  . Stroke ([place])   . Tachycardia     Patient Active Problem List   Diagnosis Date Noted  . Elevated troponin 06/30/2018  . Altered mental status 11/20/2017  . LFT elevation   . Alcoholic ketosis   . Dehydration   . Alcohol withdrawal ([place]) 11/09/2017  . Alcohol-induced acute pancreatitis   . Hematemesis 02/15/2017  . Acute  pancreatitis 02/15/2017  . Alcohol dependence with unspecified alcohol-induced disorder ([place])   . Alcohol withdrawal delirium ([place]) 09/13/2016  . C. difficile colitis 03/30/2016  . Essential hypertension 03/23/2016  . Fall   . Tobacco abuse   . Hypokalemia   . CAD (coronary artery disease)   . Cocaine abuse ([place])   . Frequent falls 01/11/2016  . Left rib fracture 01/11/2016  . Acute encephalopathy 01/11/2016  . Polysubstance abuse ([place]) 01/11/2016  . Delirium tremens ([place]) 01/11/2016  . NSTEMI (non-ST elevated myocardial infarction) ([place]) 01/11/2016  . Sepsis due to Enterococcus with acute renal failure and metabolic encephalopathy ([place]) 01/11/2016  . Chronic alcohol abuse   . CAD (coronary artery disease), native coronary artery 01/10/2016  . Fatty liver, alcoholic 34/19/3790  . Hypomagnesemia 04/03/2015  . Ataxia   . Acute respiratory failure ([place]) 09/05/2014  . Alcohol withdrawal, with delirium ([place]) 09/03/2014  . Unspecified cerebral artery occlusion with cerebral infarction 07/05/2014  . Alcohol abuse 07/02/2014  . Current tobacco use 01/17/2014  . Hearing loss 11/25/2013  . Tinnitus of both ears 11/25/2013    Past Surgical History:  Procedure Laterality Date  . CARDIAC CATHETERIZATION N/A 01/15/2016   Procedure: Left Heart Cath and Coronary Angiography;  Surgeon: Burnell Blanks, MD;  Location: [facility];  Service: Cardiovascular;  Laterality: N/A;        Home Medications    Prior to Admission medications   Medication Sig Start Date End Date Taking? Authorizing Provider  aspirin 81 MG EC tablet Take 1 tablet (81 mg total) by mouth daily. 07/11/14   Angiulli, Lavon Paganini, PA-C  atorvastatin (LIPITOR) 40 MG tablet Take 1 tablet (40 mg total) by mouth daily at 6 PM. 01/19/16   Allie Bossier, MD  buPROPion [facility] SR) 200 MG 12 hr tablet Take 200 mg by mouth daily.    [provider]  carvedilol (COREG) 12.5 MG tablet Take 1 tablet (12.5 mg  total) by mouth 2 (two) times daily. 02/23/17   Florencia Reasons, MD  cholecalciferol (VITAMIN D) 1000 units tablet Take 1,000 Units by mouth 2 (two) times daily.     [provider]  Coenzyme Q10 (CO Q-10) 200 MG CAPS Take 200 mg by mouth daily.    [provider]  folic acid (FOLVITE) 1 MG tablet take 1 tablet by mouth once daily 01/12/17   Medina-Vargas, Monina C, NP  hydrOXYzine (ATARAX/VISTARIL) 10 MG tablet Take 1 tablet (10 mg total) by mouth 3 (three) times daily as needed for anxiety. Patient not taking: Reported on 11/09/2017 05/13/16   Velvet Bathe, MD  LORazepam (ATIVAN) 1 MG tablet Take 1 tablet (1 mg total) by mouth 2 (two) times daily as needed for anxiety. 02/23/17   Florencia Reasons, MD  Magnesium 500 MG CAPS Take 500 mg by mouth at bedtime.    [provider]  mirtazapine (REMERON) 7.5 MG tablet take 1 tablet by mouth at bedtime Patient taking differently: take 15mg  by mouth at bedtime 01/12/17   Medina-Vargas, Monina C, NP  nitroGLYCERIN (NITROSTAT) 0.4 MG SL tablet Place 1 tablet (0.4 mg total) under the tongue every 5 (five) minutes as needed for chest pain. 05/13/16   Velvet Bathe, MD  Omega-3 Fatty Acids (FISH OIL) 1000 MG CAPS Take 1,000 mg by mouth 2 (two) times daily.     [provider]  potassium chloride SA (K-DUR,KLOR-CON) 20 MEQ tablet take 2 tablets by mouth once daily Patient taking differently: take 1 tablets by mouth once daily 01/12/17   Medina-Vargas, Monina C, NP  QUEtiapine (SEROQUEL) 200 MG tablet Take 1 tablet (200 mg total) by mouth at bedtime. 11/21/17   Charlynne Cousins, MD  QUEtiapine (SEROQUEL) 25 MG tablet Take 1 tablet (25 mg total) by mouth daily before breakfast. 11/22/17   Charlynne Cousins, MD  senna-docusate (SENOKOT-S) 8.6-50 MG tablet Take 1 tablet by mouth at bedtime. Patient not taking: Reported on 11/09/2017 02/23/17   Florencia Reasons, MD  tamsulosin [facility]) 0.4 MG CAPS capsule take 1 capsule by mouth once daily 01/12/17    Medina-Vargas, Monina C, NP  thiamine (VITAMIN B-1) 100 MG tablet Take 100 mg by mouth daily.    [provider]  triamterene-hydrochlorothiazide (DYAZIDE) 37.5-25 MG capsule Take 1 capsule by mouth daily. 10/04/17   [provider]    Family History Family History  Problem Relation Age of Onset  . Cancer Mother        esophageal  . Hypertension Mother   . Diabetes Mother   . Cancer Father        prostate  . Heart disease Maternal Grandfather        MI  . Cancer Paternal Grandfather        prostate    Social History Social History   Tobacco Use  .  Smoking status: Current Every Day Smoker    Packs/day: 0.50    Years: 30.00    Pack years: 15.00    Types: Cigarettes  . Smokeless tobacco: Never Used  Substance Use Topics  . Alcohol use: Yes    Alcohol/week: 33.6 oz    Types: 56 Shots of liquor per week    Comment: last drink yesterday  . Drug use: No    Comment: Cocaine intermittently     Allergies   Lisinopril and Celebrex [celecoxib]   Review of Systems Review of Systems  Constitutional: Positive for chills. Negative for fever.  Respiratory: Positive for cough (when vomiting) and shortness of breath.   Cardiovascular: Negative for chest pain.  Gastrointestinal: Positive for constipation, nausea and vomiting. Negative for abdominal pain and diarrhea.  Genitourinary: Negative for dysuria.  Musculoskeletal: Negative for back pain.  Neurological: Positive for dizziness, weakness (diffuse) and light-headedness. Negative for headaches.  All other systems reviewed and are negative.    Physical Exam Updated Vital Signs BP (!) 149/109 (BP Location: Right Arm)   Pulse 84   Temp 98.1 F (36.7 C) (Oral)   Resp 20   Ht 5\' 10"  (1.778 m)   Wt 95.3 kg (210 lb)   SpO2 98%   BMI 30.13 kg/m   Physical Exam  Constitutional: He appears well-developed and well-nourished.  HENT:  Head: Normocephalic and atraumatic.  Right Ear: External ear normal.    Left Ear: External ear normal.  Nose: Nose normal.  Mouth/Throat: Mucous membranes are dry.  Eyes: Pupils are equal, round, and reactive to light. EOM are normal. Right eye exhibits no discharge. Left eye exhibits no discharge.  Neck: Neck supple.  Cardiovascular: Regular rhythm and normal heart sounds. Tachycardia present.  Pulmonary/Chest: Effort normal and breath sounds normal.  Abdominal: Soft. He exhibits no distension. There is no tenderness.  Musculoskeletal: He exhibits no edema.  Neurological: He is alert. He is disoriented. He displays tremor (noticed on finger to nose).  Awake, alert, oriented to person and place. Knows it's July but is off on year and day of week. CN 3-12 grossly intact. 5/5 strength in all 4 extremities. Grossly normal sensation. Normal finger to nose save for tremor  Skin: Skin is warm and dry. He is not diaphoretic.  Nursing note and vitals reviewed.    ED Treatments / Results  Labs (all labs ordered are listed, but only abnormal results are displayed) Labs Reviewed  COMPREHENSIVE METABOLIC PANEL - Abnormal; Notable for the following components:      Result Value   Potassium 5.4 (*)    CO2 20 (*)    Glucose, Bld 170 (*)    BUN 36 (*)    Creatinine, Ser 1.57 (*)    Calcium 10.4 (*)    Total Protein 8.4 (*)    Total Bilirubin 2.0 (*)    GFR calc non Af Amer 43 (*)    GFR calc Af Amer 50 (*)    Anion gap 22 (*)    All other components within normal limits  TROPONIN I - Abnormal; Notable for the following components:   Troponin I 0.72 (*)    All other components within normal limits  CBC WITH DIFFERENTIAL/PLATELET - Abnormal; Notable for the following components:   WBC 11.5 (*)    MCH 34.2 (*)    MCHC 36.3 (*)    Neutro Abs 10.3 (*)    All other components within normal limits  LIPASE, BLOOD  AMMONIA  URINALYSIS, ROUTINE  W REFLEX MICROSCOPIC  RAPID URINE DRUG SCREEN, HOSP PERFORMED  APTT  PROTIME-INR    EKG EKG  Interpretation  Date/Time:  Saturday June 30 2018 20:35:25 EDT Ventricular Rate:  86 PR Interval:    QRS Duration: 82 QT Interval:  366 QTC Calculation: 438 R Axis:   74 Text Interpretation:  Sinus rhythm Borderline T abnormalities, anterior leads Confirmed by Sherwood Gambler [phone]) on 06/30/2018 8:49:10 PM   Radiology Dg Chest 2 View  Result Date: 06/30/2018 CLINICAL DATA:  Pt from home with complaint of generalized weakness for the past 24 hours. Pt. stated that he was nauseous and has vomited several times this morning. EXAM: CHEST - 2 VIEW COMPARISON:  11/15/2017 FINDINGS: Cardiac silhouette is normal in size and configuration. No mediastinal or hilar masses or evidence of adenopathy. Clear lungs.  No pleural effusion or pneumothorax. Skeletal structures are intact. IMPRESSION: No active cardiopulmonary disease. Electronically Signed   By: Lajean Manes M.D.   On: 06/30/2018 20:54   Ct Head Wo Contrast  Result Date: 06/30/2018 CLINICAL DATA:  72 year old male with altered mental status. EXAM: CT HEAD WITHOUT CONTRAST TECHNIQUE: Contiguous axial images were obtained from the base of the skull through the vertex without intravenous contrast. COMPARISON:  Head CT dated 10/21/2016 FINDINGS: Brain: The ventricles and sulci appropriate size for patient's age. Mild periventricular and deep white matter chronic microvascular ischemic changes noted. There is no acute intracranial hemorrhage. No mass effect or midline shift. No extra-axial fluid collection. Vascular: No hyperdense vessel or unexpected calcification. Skull: Normal. Negative for fracture or focal lesion. Sinuses/Orbits: There is partial opacification of the maxillary sinuses with remodeling of the walls of the right maxillary sinus consistent with chronic sinusitis. The mastoid air cells are clear. There is no air-fluid levels. Stable foci of calcification over the left globe similar to prior CT. Other: None IMPRESSION: 1. No acute  intracranial pathology. 2. Mild age-related atrophy and chronic microvascular ischemic changes. 3. Chronic appearing bilateral maxillary sinus disease. Electronically Signed   By: Anner Crete M.D.   On: 06/30/2018 21:54    Procedures .Critical Care Performed by: Sherwood Gambler, MD Authorized by: Sherwood Gambler, MD   Critical care provider statement:    Critical care time (minutes):  30   Critical care time was exclusive of:  Separately billable procedures and treating other patients   Critical care was necessary to treat or prevent imminent or life-threatening deterioration of the following conditions:  Renal failure   Critical care was time spent personally by me on the following activities:  Discussions with consultants, development of treatment plan with patient or surrogate, evaluation of patient's response to treatment, examination of patient, obtaining history from patient or surrogate, ordering and performing treatments and interventions, ordering and review of laboratory studies, ordering and review of radiographic studies, pulse oximetry, re-evaluation of patient's condition and review of old charts   (including critical care time)  Medications Ordered in ED Medications  sodium chloride 0.9 % bolus 1,000 mL (1,000 mLs Intravenous New Bag/Given 06/30/18 2229)  heparin bolus via infusion 4,000 Units (has no administration in time range)  heparin ADULT infusion 100 units/mL (25000 units/271mL sodium chloride 0.45%) (has no administration in time range)  sodium chloride 0.9 % bolus 1,000 mL (0 mLs Intravenous Stopped 06/30/18 2149)  promethazine (PHENERGAN) injection 12.5 mg (12.5 mg Intravenous Given 06/30/18 2028)  LORazepam (ATIVAN) injection 1 mg (1 mg Intravenous Given 06/30/18 2014)  ondansetron (ZOFRAN) injection 4 mg (4 mg Intravenous Given 06/30/18  2153)  aspirin chewable tablet 324 mg (324 mg Oral Given 06/30/18 2229)     Initial Impression / Assessment and Plan / ED  Course  I have reviewed the triage vital signs and the nursing notes.  Pertinent labs & imaging results that were available during my care of the patient were reviewed by me and considered in my medical decision making (see chart for details).     Patient continues to have no chest pain.  His lab work is concerning for an acute kidney injury with a new creatinine of 1.5, up from around 0.75.  He also has some mild hyperkalemia.  Nonspecific anterior T wave changes.  He does have an elevated troponin.  He continues to endorse no chest pain but does have the dyspnea and dizziness.  Wife comes in and tells me that he has been a little confused at home and he is a little confused here.  CT head negative.  Possibly some alcohol withdrawal as she endorses he is been drinking heavily the last several days and now obviously has stopped due to the vomiting.  I consulted with cardiology, Dr. Radford Pax, who recommends heparin, UDS for cocaine as he has this positive in the past, and admit to the hospitalist.  If hospitalist would like cardiology consultation in the morning they can reconsult them.  In 2017 he had a heart cath with 60-70% lesions in mid-RCA, not stented and not felt to be cause of troponin then. He has been given aspirin.  He has been given IV fluids.  Abdominal pain is benign, don't think CT necessary at this time. No hypoxia, doubt PE. Dr. Maudie Mercury to admit.  Final Clinical Impressions(s) / ED Diagnoses   Final diagnoses:  Acute kidney injury [facility])  Elevated troponin    ED Discharge Orders    None       Sherwood Gambler, MD 06/30/18 6060    Sherwood Gambler, MD 06/30/18 2316    Sherwood Gambler, MD 06/30/18 2317

## 2018-06-30 NOTE — ED Notes (Signed)
Bed: WA07 Expected date:  Expected time:  Means of arrival:  Comments: 72 yo M/ Weakness N/V

## 2018-06-30 NOTE — ED Notes (Signed)
Patient transported to CT 

## 2018-06-30 NOTE — ED Notes (Signed)
Reminded for urinalysis, pt. Still unable to void at this time.

## 2018-06-30 NOTE — ED Notes (Signed)
ED TO INPATIENT HANDOFF REPORT  Name/Age/Gender Joshua Henson 72 y.o. male  Code Status Code Status History    Date Active Date Inactive Code Status Order ID Comments User Context   11/09/2017 1626 11/21/2017 1846 Full Code [id number]  Geradine Girt, DO ED   11/09/2017 1256 11/09/2017 1625 Full Code [id number]  Blanchie Dessert, MD ED   02/15/2017 0753 02/23/2017 2014 Full Code [id number]  Reyne Dumas, MD ED   09/13/2016 1830 09/23/2016 1759 Full Code [id number]  Mariel Aloe, MD Inpatient   05/10/2016 0149 05/13/2016 1651 Full Code [id number]  Toy Baker, MD ED   03/23/2016 1648 04/04/2016 1636 Full Code [id number]  Cristal Ford, DO Inpatient   01/18/2016 0925 01/19/2016 2146 Full Code [id number]  Cherene Altes, MD Inpatient   01/15/2016 1537 01/18/2016 0925 Full Code [id number]  Burnell Blanks, MD Inpatient   01/10/2016 2158 01/15/2016 1537 Full Code [id number]  Norval Morton, MD Inpatient   04/02/2015 1929 04/08/2015 1938 Full Code [id number]  Deneise Lever, MD ED   12/09/2014 2227 12/11/2014 1214 Full Code [id number]  Evelina Bucy, MD ED   12/09/2014 1622 12/09/2014 2227 Full Code [id number]  Charlott Rakes, MD ED   09/03/2014 0119 09/12/2014 1550 Full Code [id number]  Allie Bossier, MD ED   07/07/2014 1631 07/11/2014 1739 Full Code [id number]  Cathlyn Parsons, PA-C Inpatient   07/02/2014 0338 07/07/2014 1631 Full Code [id number]  Theressa Millard, MD Inpatient      Home/SNF/Other Home  Chief Complaint weakness  Level of Care/Admitting Diagnosis ED Disposition    ED Disposition Condition [facility] Area: [facility] [[id number]]  Level of Care: Stepdown [14]  Admit to SDU based on following criteria: Cardiac Instability:  Patients experiencing chest pain, unconfirmed MI and stable, arrhythmias and CHF requiring medical management and potentially compromising patient's stability  Diagnosis: Elevated troponin [409735]  Admitting  Physician: Jani Gravel [[id number]]  Attending Physician: Jani Gravel [phone]  Estimated length of stay: past midnight tomorrow  Certification:: I certify this patient will need inpatient services for at least 2 midnights  PT Class (Do Not Modify): Inpatient [101]  PT Acc Code (Do Not Modify): Private [1]       Medical History Past Medical History:  Diagnosis Date  . Acute hepatic encephalopathy   . Alcoholic cirrhosis of liver without ascites ([place])   . Anxiety state   . CAD (coronary artery disease), native coronary artery 01/10/2016   3 vessel coronary calcification noted on CT scan 04/03/15    . Depression   . Fatty liver, alcoholic 2/42/6834  . Hypertension   . Increased ammonia level   . Insomnia   . NSTEMI (non-ST elevated myocardial infarction) ([place])   . Prostate cancer ([place]) 2010   External beam radiation (urol - Risa Grill, XRT Valere Dross)  . Stroke ([place])   . Tachycardia     Allergies Allergies  Allergen Reactions  . Lisinopril     syncope  . Celebrex [Celecoxib] Rash    IV Location/Drains/Wounds Patient Lines/Drains/Airways Status   Active Line/Drains/Airways    Name:   Placement date:   Placement time:   Site:   Days:   Peripheral IV 06/30/18 Left Antecubital   06/30/18    -    Antecubital   less than 1   Wound / Incision (Open or Dehisced) 03/23/16 Other (Comment) Toe (Comment  which one) Right right great toe laceration   03/23/16  1650    Toe (Comment  which one)   829   Wound / Incision (Open or Dehisced) 09/13/16 Other (Comment) Other (Comment) Right;Left;Lower;Upper ABRASIONS/HEALING NO S/S INFECTION   09/13/16    1805    Other (Comment)   655   Wound / Incision (Open or Dehisced) 09/13/16 Other (Comment) Finger (Comment which one) Right 5TH FINGER BROKEN/ RT HAND   09/13/16    1806    Finger (Comment which one)   655          Labs/Imaging Results for orders placed or performed during the hospital encounter of 06/30/18 (from the past 48 hour(s))  Comprehensive  metabolic panel     Status: Abnormal   Collection Time: 06/30/18  8:00 PM  Result Value Ref Range   Sodium 144 135 - 145 mmol/L   Potassium 5.4 (H) 3.5 - 5.1 mmol/L   Chloride 102 98 - 111 mmol/L    Comment: Please note change in reference range.   CO2 20 (L) 22 - 32 mmol/L   Glucose, Bld 170 (H) 70 - 99 mg/dL    Comment: Please note change in reference range.   BUN 36 (H) 8 - 23 mg/dL    Comment: Please note change in reference range.   Creatinine, Ser 1.57 (H) 0.61 - 1.24 mg/dL   Calcium 10.4 (H) 8.9 - 10.3 mg/dL   Total Protein 8.4 (H) 6.5 - 8.1 g/dL   Albumin 4.8 3.5 - 5.0 g/dL   AST 41 15 - 41 U/L   ALT 33 0 - 44 U/L    Comment: Please note change in reference range.   Alkaline Phosphatase 73 38 - 126 U/L   Total Bilirubin 2.0 (H) 0.3 - 1.2 mg/dL   GFR calc non Af Amer 43 (L) >60 mL/min   GFR calc Af Amer 50 (L) >60 mL/min    Comment: (NOTE) The eGFR has been calculated using the CKD EPI equation. This calculation has not been validated in all clinical situations. eGFR's persistently <60 mL/min signify possible Chronic Kidney Disease.    Anion gap 22 (H) 5 - 15    Comment: Performed at [facility], [place] [address], [place], [place] [address]  Lipase, blood     Status: None   Collection Time: 06/30/18  8:00 PM  Result Value Ref Range   Lipase 43 11 - 51 U/L    Comment: Performed at [facility], [place] [address], [place], [place] [address]  Troponin I     Status: Abnormal   Collection Time: 06/30/18  8:00 PM  Result Value Ref Range   Troponin I 0.72 (HH) <0.03 ng/mL    Comment: CRITICAL RESULT CALLED TO, READ BACK BY AND VERIFIED WITH: Alanson Aly RN [phone] 06/30/2018 H[place] K Performed at [facility] [address] [place], [place] [address]   CBC with Differential     Status: Abnormal   Collection Time: 06/30/18  8:00 PM  Result Value Ref Range   WBC 11.5 (H) 4.0 - 10.5 K/uL   RBC 4.94 4.22 - 5.81 MIL/uL    Hemoglobin 16.9 13.0 - 17.0 g/dL   HCT 46.5 39.0 - 52.0 %   MCV 94.1 78.0 - 100.0 fL   MCH 34.2 (H) 26.0 - 34.0 pg   MCHC 36.3 (H) 30.0 - 36.0 g/dL   RDW 15.4 11.5 - 15.5 %   Platelets 295 150 - 400 K/uL   Neutrophils Relative % 89 %  Neutro Abs 10.3 (H) 1.7 - 7.7 K/uL   Lymphocytes Relative 7 %   Lymphs Abs 0.8 0.7 - 4.0 K/uL   Monocytes Relative 4 %   Monocytes Absolute 0.4 0.1 - 1.0 K/uL   Eosinophils Relative 0 %   Eosinophils Absolute 0.0 0.0 - 0.7 K/uL   Basophils Relative 0 %   Basophils Absolute 0.0 0.0 - 0.1 K/uL    Comment: Performed at [facility], [place] [address], [place], [place] [address]  Ammonia     Status: None   Collection Time: 06/30/18  8:53 PM  Result Value Ref Range   Ammonia 34 9 - 35 umol/L    Comment: Performed at [facility], [place] [address] [place], [place] [address]   Dg Chest 2 View  Result Date: 06/30/2018 CLINICAL DATA:  Pt from home with complaint of generalized weakness for the past 24 hours. Pt. stated that he was nauseous and has vomited several times this morning. EXAM: CHEST - 2 VIEW COMPARISON:  11/15/2017 FINDINGS: Cardiac silhouette is normal in size and configuration. No mediastinal or hilar masses or evidence of adenopathy. Clear lungs.  No pleural effusion or pneumothorax. Skeletal structures are intact. IMPRESSION: No active cardiopulmonary disease. Electronically Signed   By: Lajean Manes M.D.   On: 06/30/2018 20:54   Ct Head Wo Contrast  Result Date: 06/30/2018 CLINICAL DATA:  72 year old male with altered mental status. EXAM: CT HEAD WITHOUT CONTRAST TECHNIQUE: Contiguous axial images were obtained from the base of the skull through the vertex without intravenous contrast. COMPARISON:  Head CT dated 10/21/2016 FINDINGS: Brain: The ventricles and sulci appropriate size for patient's age. Mild periventricular and deep white matter chronic microvascular ischemic changes noted. There is no acute  intracranial hemorrhage. No mass effect or midline shift. No extra-axial fluid collection. Vascular: No hyperdense vessel or unexpected calcification. Skull: Normal. Negative for fracture or focal lesion. Sinuses/Orbits: There is partial opacification of the maxillary sinuses with remodeling of the walls of the right maxillary sinus consistent with chronic sinusitis. The mastoid air cells are clear. There is no air-fluid levels. Stable foci of calcification over the left globe similar to prior CT. Other: None IMPRESSION: 1. No acute intracranial pathology. 2. Mild age-related atrophy and chronic microvascular ischemic changes. 3. Chronic appearing bilateral maxillary sinus disease. Electronically Signed   By: Anner Crete M.D.   On: 06/30/2018 21:54    Pending Labs Unresulted Labs (From admission, onward)   Start     Ordered   06/30/18 2301  APTT  STAT,   R     06/30/18 2300   06/30/18 2301  Protime-INR  STAT,   R     06/30/18 2300   06/30/18 2252  Urine rapid drug screen (hosp performed)  STAT,   STAT     06/30/18 2251   06/30/18 2000  Urinalysis, Routine w reflex microscopic  STAT,   STAT     06/30/18 1959      Vitals/Pain Today's Vitals   06/30/18 2230 06/30/18 2238 06/30/18 2300 06/30/18 2308  BP: (!) 149/109     Pulse: 84     Resp: 20     Temp:      TempSrc:      SpO2: 98%     Weight:   210 lb (95.3 kg)   Height:   5' 10"  (1.778 m)   PainSc:  0-No pain  0-No pain    Isolation Precautions No active isolations  Medications Medications  sodium chloride 0.9 % bolus 1,000 mL (1,000 mLs Intravenous New Bag/Given 06/30/18 2229)  heparin bolus via infusion 4,000 Units (has no administration in time range)  heparin ADULT infusion 100 units/mL (25000 units/252m sodium chloride 0.45%) (has no administration in time range)  sodium chloride 0.9 % bolus 1,000 mL (0 mLs Intravenous Stopped 06/30/18 2149)  promethazine (PHENERGAN) injection 12.5 mg (12.5 mg Intravenous Given 06/30/18  2028)  LORazepam (ATIVAN) injection 1 mg (1 mg Intravenous Given 06/30/18 2014)  ondansetron (ZOFRAN) injection 4 mg (4 mg Intravenous Given 06/30/18 2153)  aspirin chewable tablet 324 mg (324 mg Oral Given 06/30/18 2229)    Mobility walks with person assist

## 2018-06-30 NOTE — ED Triage Notes (Addendum)
Per EMS, pr.from home with complaint of generalized  Weakness for the past 24 hours. Pt. Stated that he was nauseous and had vomited  Several times this morning but denied diarrhea. Denied pain, denied fever. A/O x3. Pt. Has hx of liver cirrhosis, last alcohol intake two days ago, followed command.

## 2018-06-30 NOTE — Progress Notes (Signed)
ANTICOAGULATION CONSULT NOTE - Initial Consult  Pharmacy Consult for IV heparin Indication: chest pain/ACS  Allergies  Allergen Reactions  . Lisinopril     syncope  . Celebrex [Celecoxib] Rash    Patient Measurements: Height: 5\' 10"  (177.8 cm) Weight: 210 lb (95.3 kg) IBW/kg (Calculated) : 73 Heparin Dosing Weight: 80 kg  Vital Signs: Temp: 98.1 F (36.7 C) (07/13 1940) Temp Source: Oral (07/13 1940) BP: 149/109 (07/13 2230) Pulse Rate: 84 (07/13 2230)  Labs: Recent Labs    06/30/18 2000  HGB 16.9  HCT 46.5  PLT 295  CREATININE 1.57*  TROPONINI 0.72*    Estimated Creatinine Clearance: 50.7 mL/min (A) (by C-G formula based on SCr of 1.57 mg/dL (H)).   Medical History: Past Medical History:  Diagnosis Date  . Acute hepatic encephalopathy   . Alcoholic cirrhosis of liver without ascites ([place])   . Anxiety state   . CAD (coronary artery disease), native coronary artery 01/10/2016   3 vessel coronary calcification noted on CT scan 04/03/15    . Depression   . Fatty liver, alcoholic 0/60/1561  . Hypertension   . Increased ammonia level   . Insomnia   . NSTEMI (non-ST elevated myocardial infarction) ([place])   . Prostate cancer ([place]) 2010   External beam radiation (urol - Risa Grill, XRT Valere Dross)  . Stroke ([place])   . Tachycardia     Medications:  Scheduled:   Infusions:  . sodium chloride 1,000 mL (06/30/18 2229)    Assessment: 61 yoM c/o generalized weakness x 24 hours with troponin = 0.72  IV heparin for ACS.  Goal of Therapy:  Heparin level 0.3-0.7 units/ml Monitor platelets by anticoagulation protocol: Yes   Plan:  Wt and Ht STAT Baseline coags STAT Heparin 4000 unit bolus x1 now Start drip at 1000 units/hr Daily CBC/HL Check 1st HL in 8 hours  Dorrene German 06/30/2018,11:05 PM

## 2018-07-01 ENCOUNTER — Encounter (HOSPITAL_COMMUNITY): Payer: Self-pay | Admitting: Internal Medicine

## 2018-07-01 ENCOUNTER — Inpatient Hospital Stay (HOSPITAL_COMMUNITY): Payer: Medicare Other

## 2018-07-01 DIAGNOSIS — R079 Chest pain, unspecified: Secondary | ICD-10-CM

## 2018-07-01 DIAGNOSIS — R112 Nausea with vomiting, unspecified: Secondary | ICD-10-CM

## 2018-07-01 LAB — COMPREHENSIVE METABOLIC PANEL
ALT: 28 U/L (ref 0–44)
ANION GAP: 12 (ref 5–15)
AST: 36 U/L (ref 15–41)
Albumin: 3.7 g/dL (ref 3.5–5.0)
Alkaline Phosphatase: 58 U/L (ref 38–126)
BILIRUBIN TOTAL: 1.5 mg/dL — AB (ref 0.3–1.2)
BUN: 35 mg/dL — ABNORMAL HIGH (ref 8–23)
CHLORIDE: 107 mmol/L (ref 98–111)
CO2: 23 mmol/L (ref 22–32)
Calcium: 8.7 mg/dL — ABNORMAL LOW (ref 8.9–10.3)
Creatinine, Ser: 1.4 mg/dL — ABNORMAL HIGH (ref 0.61–1.24)
GFR calc Af Amer: 57 mL/min — ABNORMAL LOW (ref >60)
GFR, EST NON AFRICAN AMERICAN: 49 mL/min — AB (ref >60)
Glucose, Bld: 132 mg/dL — ABNORMAL HIGH (ref 70–99)
POTASSIUM: 3.2 mmol/L — AB (ref 3.5–5.1)
Sodium: 142 mmol/L (ref 135–145)
TOTAL PROTEIN: 6.7 g/dL (ref 6.5–8.1)

## 2018-07-01 LAB — HIV ANTIBODY (ROUTINE TESTING W REFLEX): HIV SCREEN 4TH GENERATION: NONREACTIVE

## 2018-07-01 LAB — CBC
HCT: 38.6 % — ABNORMAL LOW (ref 39.0–52.0)
HEMATOCRIT: 39.1 % (ref 39.0–52.0)
HEMOGLOBIN: 14 g/dL (ref 13.0–17.0)
HEMOGLOBIN: 14 g/dL (ref 13.0–17.0)
MCH: 34.7 pg — ABNORMAL HIGH (ref 26.0–34.0)
MCH: 35.7 pg — AB (ref 26.0–34.0)
MCHC: 35.8 g/dL (ref 30.0–36.0)
MCHC: 36.3 g/dL — ABNORMAL HIGH (ref 30.0–36.0)
MCV: 97 fL (ref 78.0–100.0)
MCV: 98.5 fL (ref 78.0–100.0)
Platelets: 239 10*3/uL (ref 150–400)
Platelets: 250 10*3/uL (ref 150–400)
RBC: 3.92 MIL/uL — AB (ref 4.22–5.81)
RBC: 4.03 MIL/uL — AB (ref 4.22–5.81)
RDW: 15.4 % (ref 11.5–15.5)
RDW: 15.6 % — ABNORMAL HIGH (ref 11.5–15.5)
WBC: 9.4 10*3/uL (ref 4.0–10.5)
WBC: 9.6 10*3/uL (ref 4.0–10.5)

## 2018-07-01 LAB — URINALYSIS, ROUTINE W REFLEX MICROSCOPIC
Bilirubin Urine: NEGATIVE
Glucose, UA: NEGATIVE mg/dL
Hgb urine dipstick: NEGATIVE
Ketones, ur: 20 mg/dL — AB
Leukocytes, UA: NEGATIVE
Nitrite: NEGATIVE
PROTEIN: NEGATIVE mg/dL
Specific Gravity, Urine: 1.025 (ref 1.005–1.030)
pH: 5 (ref 5.0–8.0)

## 2018-07-01 LAB — ECHOCARDIOGRAM COMPLETE
Height: 70 in
WEIGHTICAEL: 3280.44 [oz_av]

## 2018-07-01 LAB — HEMOGLOBIN A1C
HEMOGLOBIN A1C: 5.6 % (ref 4.8–5.6)
Mean Plasma Glucose: 114.02 mg/dL

## 2018-07-01 LAB — LIPID PANEL
Cholesterol: 145 mg/dL (ref 0–200)
HDL: 66 mg/dL (ref >40)
LDL CALC: 63 mg/dL (ref 0–99)
TRIGLYCERIDES: 79 mg/dL (ref <150)
Total CHOL/HDL Ratio: 2.2 RATIO
VLDL: 16 mg/dL (ref 0–40)

## 2018-07-01 LAB — HEPARIN LEVEL (UNFRACTIONATED)
Heparin Unfractionated: 0.35 IU/mL (ref 0.30–0.70)
Heparin Unfractionated: 0.24 IU/mL — ABNORMAL LOW (ref 0.30–0.70)

## 2018-07-01 LAB — RAPID URINE DRUG SCREEN, HOSP PERFORMED
Amphetamines: NOT DETECTED
Benzodiazepines: POSITIVE — AB
COCAINE: NOT DETECTED
Opiates: NOT DETECTED
Tetrahydrocannabinol: POSITIVE — AB

## 2018-07-01 LAB — TROPONIN I
TROPONIN I: 0.65 ng/mL — AB (ref <0.03)
TROPONIN I: 0.65 ng/mL — AB (ref <0.03)
Troponin I: 0.57 ng/mL (ref <0.03)

## 2018-07-01 LAB — CK TOTAL AND CKMB (NOT AT ARMC)
CK TOTAL: 409 U/L — AB (ref 49–397)
CK, MB: 4.1 ng/mL (ref 0.5–5.0)
RELATIVE INDEX: 1 (ref 0.0–2.5)

## 2018-07-01 LAB — MRSA PCR SCREENING: MRSA by PCR: NEGATIVE

## 2018-07-01 MED ORDER — CARVEDILOL 12.5 MG PO TABS
12.5000 mg | ORAL_TABLET | Freq: Two times a day (BID) | ORAL | Status: DC
  Administered 2018-07-01 – 2018-07-03 (×4): 12.5 mg via ORAL
  Filled 2018-07-01 (×5): qty 1

## 2018-07-01 MED ORDER — VITAMIN D3 25 MCG (1000 UNIT) PO TABS
1000.0000 [IU] | ORAL_TABLET | Freq: Two times a day (BID) | ORAL | Status: DC
  Administered 2018-07-01 – 2018-07-03 (×6): 1000 [IU] via ORAL
  Filled 2018-07-01 (×6): qty 1

## 2018-07-01 MED ORDER — BUPROPION HCL ER (SR) 100 MG PO TB12
200.0000 mg | ORAL_TABLET | Freq: Every day | ORAL | Status: DC
  Administered 2018-07-01 – 2018-07-03 (×3): 200 mg via ORAL
  Filled 2018-07-01 (×3): qty 2

## 2018-07-01 MED ORDER — OMEGA-3-ACID ETHYL ESTERS 1 G PO CAPS
1.0000 g | ORAL_CAPSULE | Freq: Every day | ORAL | Status: DC
  Administered 2018-07-01 – 2018-07-03 (×3): 1 g via ORAL
  Filled 2018-07-01 (×4): qty 1

## 2018-07-01 MED ORDER — CO Q-10 200 MG PO CAPS: 200.0000 mg | ORAL_CAPSULE | Freq: Every day | ORAL | Status: DC

## 2018-07-01 MED ORDER — TRIAMTERENE-HCTZ 37.5-25 MG PO TABS
1.0000 | ORAL_TABLET | Freq: Every day | ORAL | Status: DC
  Administered 2018-07-01 – 2018-07-03 (×3): 1 via ORAL
  Filled 2018-07-01 (×3): qty 1

## 2018-07-01 MED ORDER — ORAL CARE MOUTH RINSE
15.0000 mL | Freq: Two times a day (BID) | OROMUCOSAL | Status: DC
  Administered 2018-07-01 – 2018-07-03 (×4): 15 mL via OROMUCOSAL

## 2018-07-01 MED ORDER — SODIUM CHLORIDE 0.9 % IV SOLN
INTRAVENOUS | Status: AC
  Administered 2018-07-01: 01:00:00 via INTRAVENOUS

## 2018-07-01 MED ORDER — QUETIAPINE FUMARATE 50 MG PO TABS
25.0000 mg | ORAL_TABLET | Freq: Every day | ORAL | Status: DC
  Administered 2018-07-01 – 2018-07-03 (×3): 25 mg via ORAL
  Filled 2018-07-01 (×3): qty 1

## 2018-07-01 MED ORDER — TAMSULOSIN HCL 0.4 MG PO CAPS
0.4000 mg | ORAL_CAPSULE | Freq: Every day | ORAL | Status: DC
  Administered 2018-07-01 – 2018-07-03 (×3): 0.4 mg via ORAL
  Filled 2018-07-01 (×3): qty 1

## 2018-07-01 MED ORDER — ACETAMINOPHEN 325 MG PO TABS: 650.0000 mg | ORAL_TABLET | Freq: Four times a day (QID) | ORAL | Status: DC | PRN

## 2018-07-01 MED ORDER — FOLIC ACID 5 MG/ML IJ SOLN
1.0000 mg | Freq: Every day | INTRAMUSCULAR | Status: DC
  Administered 2018-07-01 – 2018-07-03 (×3): 1 mg via INTRAVENOUS
  Filled 2018-07-01 (×3): qty 0.2

## 2018-07-01 MED ORDER — MAGNESIUM OXIDE 400 (241.3 MG) MG PO TABS
400.0000 mg | ORAL_TABLET | Freq: Every day | ORAL | Status: DC
  Administered 2018-07-01 – 2018-07-02 (×3): 400 mg via ORAL
  Filled 2018-07-01 (×3): qty 1

## 2018-07-01 MED ORDER — THIAMINE HCL 100 MG/ML IJ SOLN
100.0000 mg | Freq: Every day | INTRAMUSCULAR | Status: DC
  Administered 2018-07-01 – 2018-07-03 (×3): 100 mg via INTRAVENOUS
  Filled 2018-07-01 (×3): qty 2

## 2018-07-01 MED ORDER — HEPARIN (PORCINE) IN NACL 100-0.45 UNIT/ML-% IJ SOLN
1200.0000 [IU]/h | INTRAMUSCULAR | Status: DC
  Filled 2018-07-01: qty 250

## 2018-07-01 MED ORDER — LORAZEPAM 2 MG/ML IJ SOLN
2.0000 mg | INTRAMUSCULAR | Status: DC | PRN
  Administered 2018-07-01 – 2018-07-03 (×5): 2 mg via INTRAVENOUS
  Administered 2018-07-03: 3 mg via INTRAVENOUS
  Administered 2018-07-03: 2 mg via INTRAVENOUS
  Filled 2018-07-01: qty 2
  Filled 2018-07-01 (×7): qty 1

## 2018-07-01 MED ORDER — ASPIRIN EC 81 MG PO TBEC
81.0000 mg | DELAYED_RELEASE_TABLET | Freq: Every day | ORAL | Status: DC
  Administered 2018-07-01 – 2018-07-03 (×3): 81 mg via ORAL
  Filled 2018-07-01 (×3): qty 1

## 2018-07-01 MED ORDER — POTASSIUM CHLORIDE CRYS ER 20 MEQ PO TBCR
40.0000 meq | EXTENDED_RELEASE_TABLET | Freq: Every day | ORAL | Status: DC
  Administered 2018-07-01 – 2018-07-02 (×2): 40 meq via ORAL
  Filled 2018-07-01 (×3): qty 2

## 2018-07-01 MED ORDER — ACETAMINOPHEN 650 MG RE SUPP: 650.0000 mg | Freq: Four times a day (QID) | RECTAL | Status: DC | PRN

## 2018-07-01 MED ORDER — MIRTAZAPINE 15 MG PO TABS
7.5000 mg | ORAL_TABLET | Freq: Every day | ORAL | Status: DC
  Administered 2018-07-01 – 2018-07-02 (×3): 7.5 mg via ORAL
  Filled 2018-07-01 (×3): qty 1

## 2018-07-01 MED ORDER — ATORVASTATIN CALCIUM 40 MG PO TABS
40.0000 mg | ORAL_TABLET | Freq: Every day | ORAL | Status: DC
  Administered 2018-07-01 – 2018-07-02 (×2): 40 mg via ORAL
  Filled 2018-07-01 (×2): qty 1

## 2018-07-01 NOTE — Progress Notes (Signed)
ANTICOAGULATION CONSULT NOTE - Initial Consult  Pharmacy Consult for IV heparin Indication: chest pain/ACS  Allergies  Allergen Reactions  . Lisinopril     syncope  . Celecoxib Rash    Patient Measurements: Height: 5\' 10"  (177.8 cm) Weight: 205 lb 0.4 oz (93 kg) IBW/kg (Calculated) : 73 Heparin Dosing Weight: 92 kg  Vital Signs: Temp: 97.8 F (36.6 C) (07/14 0800) Temp Source: Oral (07/14 0800) BP: 124/42 (07/14 0844) Pulse Rate: 40 (07/14 0934)  Labs: Recent Labs    06/30/18 2000 06/30/18 2302 07/01/18 0052 07/01/18 0830  HGB 16.9  --   --  14.0  14.0  HCT 46.5  --   --  39.1  38.6*  PLT 295  --   --  239  250  APTT  --  29  --   --   LABPROT  --  13.9  --   --   INR  --  1.08  --   --   HEPARINUNFRC  --   --   --  0.35  CREATININE 1.57*  --   --  1.40*  CKTOTAL  --   --  409*  --   CKMB  --   --  4.1  --   TROPONINI 0.72*  --  0.57* 0.65*    Estimated Creatinine Clearance: 56.3 mL/min (A) (by C-G formula based on SCr of 1.4 mg/dL (H)).   Medical History: Past Medical History:  Diagnosis Date  . Acute hepatic encephalopathy   . Alcoholic cirrhosis of liver without ascites ([place])   . Anxiety state   . CAD (coronary artery disease), native coronary artery 01/10/2016   3 vessel coronary calcification noted on CT scan 04/03/15    . Depression   . Fatty liver, alcoholic 9/67/8938  . Hypertension   . Increased ammonia level   . Insomnia   . NSTEMI (non-ST elevated myocardial infarction) ([place])   . Prostate cancer ([place]) 2010   External beam radiation (urol - Risa Grill, XRT Valere Dross)  . Stroke ([place])   . Tachycardia     Medications:  Scheduled:  . aspirin EC  81 mg Oral Daily  . atorvastatin  40 mg Oral q1800  . buPROPion  200 mg Oral Daily  . carvedilol  12.5 mg Oral BID  . cholecalciferol  1,000 Units Oral BID  . folic acid  1 mg Intravenous Daily  . magnesium oxide  400 mg Oral QHS  . mouth rinse  15 mL Mouth Rinse BID  . mirtazapine  7.5 mg Oral  QHS  . nitroGLYCERIN  0.5 inch Topical Q8H  . omega-3 acid ethyl esters  1 g Oral Daily  . potassium chloride SA  40 mEq Oral Daily  . QUEtiapine  25 mg Oral QAC breakfast  . tamsulosin  0.4 mg Oral Daily  . thiamine injection  100 mg Intravenous Daily  . triamterene-hydrochlorothiazide  1 tablet Oral Daily   Infusions:  . sodium chloride 75 mL/hr at 07/01/18 0200  . heparin 1,000 Units/hr (07/01/18 0200)    Assessment: 18 yoM c/o generalized weakness x 24 hours. Troponin 0.72 elevated on admission with a history of CAD. Pharmacy consulted to dose/monitor heparin drip for ACS.  Patient was not on anticoagulant prior to admission. Baseline labs obtained.  Today, 07/01/18  Hgb 14, Plt 250  HL = 0.35 is therapeutic on heparin infusion of 1000 units/hr  No issues with infusion per RN  Goal of Therapy:  Heparin level 0.3-0.7 units/ml Monitor  platelets by anticoagulation protocol: Yes   Plan:   Continue heparin infusion at 1000 units/hr  Order confirmatory 8 hour HL  CBC and HL daily  Monitor for any signs/symptoms of bleeding  Lenis Noon, PharmD, BCPS Clinical Pharmacist 07/01/2018,11:00 AM

## 2018-07-01 NOTE — H&P (Addendum)
TRH H&P   Patient Demographics:    Joshua Henson, is a 72 y.o. male  MRN: [id number]   DOB - 09-Mar-1946  Admit Date - 06/30/2018  Outpatient Primary MD for the patient is Clovia Cuff, MD  Referring MD/NP/PA:   Sherwood Gambler  Outpatient Specialists:     Patient coming from: home  Chief Complaint  Patient presents with  . Fatigue      HPI:    Joshua Henson  is a 72 y.o. male, w prostate cancer, etoh dep, cirrhosis,  CVA, CAD  Who presents with nausea and vomitting earlier today.  Pt denies fever, chills, cough, cp, palp, sob, diarrhea, brbpr, black stool.   In ED CT brain IMPRESSION: 1. No acute intracranial pathology. 2. Mild age-related atrophy and chronic microvascular ischemic changes. 3. Chronic appearing bilateral maxillary sinus disease.  CXR IMPRESSION: No active cardiopulmonary disease.  Na 144, K 5.4, Bun 36, Creatinine 1.57 Ast 41, Alt 33, Alk phos 73, T. Bili 2.0 Wbc 11.5, hgb 16.9, Plt 295 Ammonia 34,  INR 1.08 Lipase 43 Trop 0.72  Pt will be admitted for n/v, and troponin elevation.  Trop 0.72 .    Review of systems:    In addition to the HPI above,  No Fever-chills, No Headache, No changes with Vision or hearing, No problems swallowing food or Liquids, No Chest pain, Cough or Shortness of Breath, No Abdominal pain,  No Blood in stool or Urine, No dysuria, No new skin rashes or bruises, No new joints pains-aches,  No new weakness, tingling, numbness in any extremity, No recent weight gain or loss, No polyuria, polydypsia or polyphagia, No significant Mental Stressors.  A full 10 point Review of Systems was done, except as stated above, all other Review of Systems were negative.   With Past History of the following :    Past Medical History:  Diagnosis Date  . Acute hepatic encephalopathy   . Alcoholic cirrhosis of liver without  ascites ([place])   . Anxiety state   . CAD (coronary artery disease), native coronary artery 01/10/2016   3 vessel coronary calcification noted on CT scan 04/03/15    . Depression   . Fatty liver, alcoholic 7/42/5956  . Hypertension   . Increased ammonia level   . Insomnia   . NSTEMI (non-ST elevated myocardial infarction) ([place])   . Prostate cancer ([place]) 2010   External beam radiation (urol - Risa Grill, XRT Valere Dross)  . Stroke ([place])   . Tachycardia       Past Surgical History:  Procedure Laterality Date  . CARDIAC CATHETERIZATION N/A 01/15/2016   Procedure: Left Heart Cath and Coronary Angiography;  Surgeon: Burnell Blanks, MD;  Location: [facility];  Service: Cardiovascular;  Laterality: N/A;      Social History:     Social History   Tobacco Use  . Smoking status: Current  Every Day Smoker    Packs/day: 0.50    Years: 30.00    Pack years: 15.00    Types: Cigarettes  . Smokeless tobacco: Never Used  Substance Use Topics  . Alcohol use: Yes    Alcohol/week: 33.6 oz    Types: 56 Shots of liquor per week    Comment: last drink yesterday     Lives - at home  Mobility - walks by self   Family History :     Family History  Problem Relation Age of Onset  . Cancer Mother        esophageal  . Hypertension Mother   . Diabetes Mother   . Cancer Father        prostate  . Heart disease Maternal Grandfather        MI  . Cancer Paternal Grandfather        prostate       Home Medications:   Prior to Admission medications   Medication Sig Start Date End Date Taking? Authorizing Provider  aspirin 81 MG EC tablet Take 1 tablet (81 mg total) by mouth daily. 07/11/14  Yes Angiulli, Lavon Paganini, PA-C  atorvastatin (LIPITOR) 40 MG tablet Take 1 tablet (40 mg total) by mouth daily at 6 PM. 01/19/16  Yes Allie Bossier, MD  buPROPion [facility] SR) 200 MG 12 hr tablet Take 200 mg by mouth daily.   Yes [provider]  carvedilol (COREG) 12.5 MG tablet Take 1  tablet (12.5 mg total) by mouth 2 (two) times daily. 02/23/17  Yes Florencia Reasons, MD  cholecalciferol (VITAMIN D) 1000 units tablet Take 1,000 Units by mouth 2 (two) times daily.    Yes [provider]  Coenzyme Q10 (CO Q-10) 200 MG CAPS Take 200 mg by mouth daily.   Yes [provider]  folic acid (FOLVITE) 1 MG tablet take 1 tablet by mouth once daily 01/12/17  Yes Medina-Vargas, Monina C, NP  LORazepam (ATIVAN) 1 MG tablet Take 1 tablet (1 mg total) by mouth 2 (two) times daily as needed for anxiety. 02/23/17  Yes Florencia Reasons, MD  Magnesium 500 MG CAPS Take 500 mg by mouth at bedtime.   Yes [provider]  mirtazapine (REMERON) 7.5 MG tablet take 1 tablet by mouth at bedtime Patient taking differently: take 5m by mouth at bedtime 01/12/17  Yes Medina-Vargas, Monina C, NP  nitroGLYCERIN (NITROSTAT) 0.4 MG SL tablet Place 1 tablet (0.4 mg total) under the tongue every 5 (five) minutes as needed for chest pain. 05/13/16  Yes VVelvet Bathe MD  Omega-3 Fatty Acids (FISH OIL) 1000 MG CAPS Take 1,000 mg by mouth 2 (two) times daily.    Yes [provider]  potassium chloride SA (K-DUR,KLOR-CON) 20 MEQ tablet take 2 tablets by mouth once daily Patient taking differently: take 1 tablets by mouth once daily 01/12/17  Yes Medina-Vargas, Monina C, NP  QUEtiapine (SEROQUEL) 25 MG tablet Take 1 tablet (25 mg total) by mouth daily before breakfast. 11/22/17  Yes FCharlynne Cousins MD  tamsulosin ([facility] 0.4 MG CAPS capsule take 1 capsule by mouth once daily 01/12/17  Yes Medina-Vargas, Monina C, NP  thiamine (VITAMIN B-1) 100 MG tablet Take 100 mg by mouth daily.   Yes [provider]  triamterene-hydrochlorothiazide (DYAZIDE) 37.5-25 MG capsule Take 1 capsule by mouth daily. 10/04/17  Yes [provider]  QUEtiapine (SEROQUEL) 200 MG tablet Take 1 tablet (200 mg total) by mouth at bedtime. Patient not taking:  Reported on 06/30/2018 11/21/17   Charlynne Cousins, MD    senna-docusate (SENOKOT-S) 8.6-50 MG tablet Take 1 tablet by mouth at bedtime. Patient not taking: Reported on 11/09/2017 02/23/17   Florencia Reasons, MD     Allergies:     Allergies  Allergen Reactions  . Lisinopril     syncope  . Celecoxib Rash     Physical Exam:   Vitals  Blood pressure (!) 149/109, pulse 84, temperature 98.1 F (36.7 C), temperature source Oral, resp. rate 20, height 5' 10"  (1.778 m), weight 95.3 kg (210 lb), SpO2 98 %.   1. General  lying in bed in NAD,   2. Normal affect and insight, Not Suicidal or Homicidal, Awake Alert, Oriented X 3.  3. No F.N deficits, ALL C.Nerves Intact, Strength 5/5 all 4 extremities, Sensation intact all 4 extremities, Plantars down going.  4. Ears and Eyes appear Normal, Conjunctivae clear, PERRLA. Moist Oral Mucosa.  5. Supple Neck, No JVD, No cervical lymphadenopathy appriciated, No Carotid Bruits.  6. Symmetrical Chest wall movement, Good air movement bilaterally, CTAB.  7. RRR, No Gallops, Rubs or Murmurs, No Parasternal Heave.  8. Positive Bowel Sounds, Abdomen Soft, No tenderness, No organomegaly appriciated,No rebound -guarding or rigidity.  9.  No Cyanosis, Normal Skin Turgor, No Skin Rash or Bruise.  10. Good muscle tone,  joints appear normal , no effusions, Normal ROM.  11. No Palpable Lymph Nodes in Neck or Axillae      Data Review:    CBC Recent Labs  Lab 06/30/18 2000  WBC 11.5*  HGB 16.9  HCT 46.5  PLT 295  MCV 94.1  MCH 34.2*  MCHC 36.3*  RDW 15.4  LYMPHSABS 0.8  MONOABS 0.4  EOSABS 0.0  BASOSABS 0.0   ------------------------------------------------------------------------------------------------------------------  Chemistries  Recent Labs  Lab 06/30/18 2000  NA 144  K 5.4*  CL 102  CO2 20*  GLUCOSE 170*  BUN 36*  CREATININE 1.57*  CALCIUM 10.4*  AST 41  ALT 33  ALKPHOS 73  BILITOT 2.0*    ------------------------------------------------------------------------------------------------------------------ estimated creatinine clearance is 50.7 mL/min (A) (by C-G formula based on SCr of 1.57 mg/dL (H)). ------------------------------------------------------------------------------------------------------------------ No results for input(s): TSH, T4TOTAL, T3FREE, THYROIDAB in the last 72 hours.  Invalid input(s): FREET3  Coagulation profile Recent Labs  Lab 06/30/18 2302  INR 1.08   ------------------------------------------------------------------------------------------------------------------- No results for input(s): DDIMER in the last 72 hours. -------------------------------------------------------------------------------------------------------------------  Cardiac Enzymes Recent Labs  Lab 06/30/18 2000  TROPONINI 0.72*   ------------------------------------------------------------------------------------------------------------------ No results found for: BNP   ---------------------------------------------------------------------------------------------------------------  Urinalysis    Component Value Date/Time   COLORURINE YELLOW 10/21/2016 [place] 10/21/2016 1835   LABSPEC 1.024 10/21/2016 1835   PHURINE 6.5 10/21/2016 1835   GLUCOSEU NEGATIVE 10/21/2016 1835   HGBUR NEGATIVE 10/21/2016 1835   BILIRUBINUR NEGATIVE 10/21/2016 1835   KETONESUR NEGATIVE 10/21/2016 1835   PROTEINUR NEGATIVE 10/21/2016 1835   UROBILINOGEN 2.0 (H) 04/02/2015 2013   NITRITE NEGATIVE 10/21/2016 1835   LEUKOCYTESUR NEGATIVE 10/21/2016 1835    ----------------------------------------------------------------------------------------------------------------   Imaging Results:    Dg Chest 2 View  Result Date: 06/30/2018 CLINICAL DATA:  Pt from home with complaint of generalized weakness for the past 24 hours. Pt. stated that he was nauseous and has  vomited several times this morning. EXAM: CHEST - 2 VIEW COMPARISON:  11/15/2017 FINDINGS: Cardiac silhouette is normal in size and configuration. No mediastinal or hilar masses or evidence of adenopathy. Clear lungs.  No pleural  effusion or pneumothorax. Skeletal structures are intact. IMPRESSION: No active cardiopulmonary disease. Electronically Signed   By: Lajean Manes M.D.   On: 06/30/2018 20:54   Ct Head Wo Contrast  Result Date: 06/30/2018 CLINICAL DATA:  72 year old male with altered mental status. EXAM: CT HEAD WITHOUT CONTRAST TECHNIQUE: Contiguous axial images were obtained from the base of the skull through the vertex without intravenous contrast. COMPARISON:  Head CT dated 10/21/2016 FINDINGS: Brain: The ventricles and sulci appropriate size for patient's age. Mild periventricular and deep white matter chronic microvascular ischemic changes noted. There is no acute intracranial hemorrhage. No mass effect or midline shift. No extra-axial fluid collection. Vascular: No hyperdense vessel or unexpected calcification. Skull: Normal. Negative for fracture or focal lesion. Sinuses/Orbits: There is partial opacification of the maxillary sinuses with remodeling of the walls of the right maxillary sinus consistent with chronic sinusitis. The mastoid air cells are clear. There is no air-fluid levels. Stable foci of calcification over the left globe similar to prior CT. Other: None IMPRESSION: 1. No acute intracranial pathology. 2. Mild age-related atrophy and chronic microvascular ischemic changes. 3. Chronic appearing bilateral maxillary sinus disease. Electronically Signed   By: Anner Crete M.D.   On: 06/30/2018 21:54       Assessment & Plan:    Principal Problem:   Elevated troponin Active Problems:   Nausea & vomiting    N/v zofran 49m iv q6h prn   Troponin elevation, in setting of known CAD 60-70% mid RCA NSTEMI Heparin gtt Cont lipitor 475mpo qhs Cont aspirin 8190mo qday Cont  Carvedilol 12.5mg48m bid Nitro paste 1/2 inch topically q8h Check lipid in am Check hga1c in am Check UDS r/o coccaine use Check cardiac echo  ED requested cardiology consult from [facility] Hime apparently recommended heparin GTT per ED  Hypertension Cont Dyazide   Anxiety/ Depression, Insomnia Cont Wellbutrin SR 200mg53mqday Cont Remeron 7.5mg p59mhs Cont Seroquel 25mg p53ms  Etoh dep CIWA protocol      DVT Prophylaxis Lovenox - SCDs  AM Labs Ordered, also please review Full Orders  Family Communication: Admission, patients condition and plan of care including tests being ordered have been discussed with the patient who indicate understanding and agree with the plan and Code Status.  Code Status FULL CODE  Likely DC to  home  Condition GUARDED   Consults called:  Cardiology by ED, unclear if is on the list for tomorrow, I have placed email consult for cardiology  Admission status: inpatient  Time spent in minutes : 70   Clair Alfieri KJani Gravel 07/01/2018 at 12:07 AM  Between 7am to 7pm - Pager - [phone] 7pm go to [email or web address] - password [email or web address]  [facility] - Office  [phone]

## 2018-07-01 NOTE — Progress Notes (Signed)
Pt complaining of not being able to drink anything. Pt educated about NPO status - given wet swabs to moisten mouth with.

## 2018-07-01 NOTE — Progress Notes (Signed)
Joshua Henson  is a 72 y.o. male, w prostate cancer, etoh dep, cirrhosis,  CVA, CAD  Who presents with nausea and vomitting earlier today.  Pt denies fever, chills, cough, cp, palp, sob, diarrhea, brbpr, black stool.   Troponin peaked at 0.65. On heparin drip. 2D echo done. Awaiting results. Denies chest pain. Reports persistent nausea with no vomiting this am.  Please refer to H&P dictated by Dr Maudie Mercury for further details of the assessment and plan.

## 2018-07-01 NOTE — Progress Notes (Signed)
ANTICOAGULATION CONSULT NOTE - Follow-Up Consult  Pharmacy Consult for IV heparin Indication: chest pain/ACS  Allergies  Allergen Reactions  . Lisinopril     syncope  . Celecoxib Rash    Patient Measurements: Height: 5\' 10"  (177.8 cm) Weight: 205 lb 0.4 oz (93 kg) IBW/kg (Calculated) : 73 Heparin Dosing Weight: 92 kg  Vital Signs: Temp: 98.2 F (36.8 C) (07/14 1600) Temp Source: Oral (07/14 1600) BP: 115/71 (07/14 1521) Pulse Rate: 61 (07/14 1600)  Labs: Recent Labs    06/30/18 2000 06/30/18 2302 07/01/18 0052 07/01/18 0830 07/01/18 1249 07/01/18 1612  HGB 16.9  --   --  14.0  14.0  --   --   HCT 46.5  --   --  39.1  38.6*  --   --   PLT 295  --   --  239  250  --   --   APTT  --  29  --   --   --   --   LABPROT  --  13.9  --   --   --   --   INR  --  1.08  --   --   --   --   HEPARINUNFRC  --   --   --  0.35  --  0.24*  CREATININE 1.57*  --   --  1.40*  --   --   CKTOTAL  --   --  409*  --   --   --   CKMB  --   --  4.1  --   --   --   TROPONINI 0.72*  --  0.57* 0.65* 0.65*  --     Estimated Creatinine Clearance: 56.3 mL/min (A) (by C-G formula based on SCr of 1.4 mg/dL (H)).   Medical History: Past Medical History:  Diagnosis Date  . Acute hepatic encephalopathy   . Alcoholic cirrhosis of liver without ascites ([place])   . Anxiety state   . CAD (coronary artery disease), native coronary artery 01/10/2016   3 vessel coronary calcification noted on CT scan 04/03/15    . Depression   . Fatty liver, alcoholic 1/60/7371  . Hypertension   . Increased ammonia level   . Insomnia   . NSTEMI (non-ST elevated myocardial infarction) ([place])   . Prostate cancer ([place]) 2010   External beam radiation (urol - Risa Grill, XRT Valere Dross)  . Stroke ([place])   . Tachycardia     Medications:  Scheduled:  . aspirin EC  81 mg Oral Daily  . atorvastatin  40 mg Oral q1800  . buPROPion  200 mg Oral Daily  . carvedilol  12.5 mg Oral BID  . cholecalciferol  1,000 Units Oral BID   . folic acid  1 mg Intravenous Daily  . magnesium oxide  400 mg Oral QHS  . mouth rinse  15 mL Mouth Rinse BID  . mirtazapine  7.5 mg Oral QHS  . nitroGLYCERIN  0.5 inch Topical Q8H  . omega-3 acid ethyl esters  1 g Oral Daily  . potassium chloride SA  40 mEq Oral Daily  . QUEtiapine  25 mg Oral QAC breakfast  . tamsulosin  0.4 mg Oral Daily  . thiamine injection  100 mg Intravenous Daily  . triamterene-hydrochlorothiazide  1 tablet Oral Daily   Infusions:  . heparin 1,000 Units/hr (07/01/18 0200)    Assessment: 47 yoM c/o generalized weakness x 24 hours. Troponin 0.72 elevated on admission with a history of CAD.  Pharmacy consulted to dose/monitor heparin drip for ACS.  Patient was not on anticoagulant prior to admission. Baseline labs obtained.  Today, 07/01/18  Hgb 14, Plt 250  HL = 0.35 is therapeutic on heparin infusion of 1000 units/hr  Confirmatory HL is 0.24, subtherapeutic  No issues with infusion per RN  Goal of Therapy:  Heparin level 0.3-0.7 units/ml Monitor platelets by anticoagulation protocol: Yes   Plan:   Increase heparin infusion to 1200 units/hr  Order confirmatory 8 hour HL  CBC and HL daily  Monitor for any signs/symptoms of bleeding   Royetta Asal, PharmD, BCPS Pager [phone] 07/01/2018 5:59 PM

## 2018-07-01 NOTE — Progress Notes (Signed)
  Echocardiogram 2D Echocardiogram has been performed.  Joshua Henson 07/01/2018, 2:46 PM

## 2018-07-01 NOTE — Progress Notes (Signed)
PHARMACIST - PHYSICIAN ORDER COMMUNICATION  CONCERNING: P&T Medication Policy on Herbal Medications  DESCRIPTION:  This patient's order for:  CoQ10  has been noted.  This product(s) is classified as an "herbal" or natural product. Due to a lack of definitive safety studies or FDA approval, nonstandard manufacturing practices, plus the potential risk of unknown drug-drug interactions while on inpatient medications, the Pharmacy and Therapeutics Committee does not permit the use of "herbal" or natural products of this type within [facility].   ACTION TAKEN: The pharmacy department is unable to verify this order at this time and your patient has been informed of this safety policy. Please reevaluate patient's clinical condition at discharge and address if the herbal or natural product(s) should be resumed at that time.   Thanks Dorrene German 07/01/2018 12:50 AM

## 2018-07-02 ENCOUNTER — Encounter (HOSPITAL_COMMUNITY): Payer: Self-pay | Admitting: Cardiology

## 2018-07-02 DIAGNOSIS — R112 Nausea with vomiting, unspecified: Secondary | ICD-10-CM

## 2018-07-02 DIAGNOSIS — R748 Abnormal levels of other serum enzymes: Secondary | ICD-10-CM

## 2018-07-02 DIAGNOSIS — N400 Enlarged prostate without lower urinary tract symptoms: Secondary | ICD-10-CM

## 2018-07-02 DIAGNOSIS — I5032 Chronic diastolic (congestive) heart failure: Secondary | ICD-10-CM

## 2018-07-02 DIAGNOSIS — N179 Acute kidney failure, unspecified: Secondary | ICD-10-CM

## 2018-07-02 LAB — CBC
HCT: 41.1 % (ref 39.0–52.0)
Hemoglobin: 14.3 g/dL (ref 13.0–17.0)
MCH: 34 pg (ref 26.0–34.0)
MCHC: 34.8 g/dL (ref 30.0–36.0)
MCV: 97.6 fL (ref 78.0–100.0)
PLATELETS: 191 10*3/uL (ref 150–400)
RBC: 4.21 MIL/uL — AB (ref 4.22–5.81)
RDW: 15.5 % (ref 11.5–15.5)
WBC: 7.5 10*3/uL (ref 4.0–10.5)

## 2018-07-02 LAB — HEPARIN LEVEL (UNFRACTIONATED)
HEPARIN UNFRACTIONATED: 0.37 [IU]/mL (ref 0.30–0.70)
Heparin Unfractionated: 0.61 IU/mL (ref 0.30–0.70)

## 2018-07-02 LAB — BASIC METABOLIC PANEL
ANION GAP: 11 (ref 5–15)
BUN: 30 mg/dL — AB (ref 8–23)
CO2: 24 mmol/L (ref 22–32)
Calcium: 8.7 mg/dL — ABNORMAL LOW (ref 8.9–10.3)
Chloride: 107 mmol/L (ref 98–111)
Creatinine, Ser: 1.27 mg/dL — ABNORMAL HIGH (ref 0.61–1.24)
GFR calc Af Amer: >60 mL/min (ref >60)
GFR, EST NON AFRICAN AMERICAN: 56 mL/min — AB (ref >60)
Glucose, Bld: 112 mg/dL — ABNORMAL HIGH (ref 70–99)
POTASSIUM: 3.8 mmol/L (ref 3.5–5.1)
SODIUM: 142 mmol/L (ref 135–145)

## 2018-07-02 MED ORDER — HEPARIN (PORCINE) IN NACL 100-0.45 UNIT/ML-% IJ SOLN
1150.0000 [IU]/h | INTRAMUSCULAR | Status: DC
  Administered 2018-07-02: 1150 [IU]/h via INTRAVENOUS
  Filled 2018-07-02: qty 250

## 2018-07-02 NOTE — Progress Notes (Signed)
ANTICOAGULATION CONSULT NOTE - Follow-Up Consult  Pharmacy Consult for IV heparin Indication: chest pain/ACS  Allergies  Allergen Reactions  . Lisinopril     syncope  . Celecoxib Rash    Patient Measurements: Height: 5\' 10"  (177.8 cm) Weight: 205 lb 0.4 oz (93 kg) IBW/kg (Calculated) : 73 Heparin Dosing Weight: 92 kg  Vital Signs: Temp: 97.6 F (36.4 C) (07/15 0000) Temp Source: Oral (07/15 0000) BP: 104/57 (07/14 2300) Pulse Rate: 62 (07/15 0200)  Labs: Recent Labs    06/30/18 2000 06/30/18 2302 07/01/18 0052 07/01/18 0830 07/01/18 1249 07/01/18 1612 07/02/18 0043  HGB 16.9  --   --  14.0  14.0  --   --   --   HCT 46.5  --   --  39.1  38.6*  --   --   --   PLT 295  --   --  239  250  --   --   --   APTT  --  29  --   --   --   --   --   LABPROT  --  13.9  --   --   --   --   --   INR  --  1.08  --   --   --   --   --   HEPARINUNFRC  --   --   --  0.35  --  0.24* 0.37  CREATININE 1.57*  --   --  1.40*  --   --  1.27*  CKTOTAL  --   --  409*  --   --   --   --   CKMB  --   --  4.1  --   --   --   --   TROPONINI 0.72*  --  0.57* 0.65* 0.65*  --   --     Estimated Creatinine Clearance: 62 mL/min (A) (by C-G formula based on SCr of 1.27 mg/dL (H)).   Medical History: Past Medical History:  Diagnosis Date  . Acute hepatic encephalopathy   . Alcoholic cirrhosis of liver without ascites ([place])   . Anxiety state   . CAD (coronary artery disease), native coronary artery 01/10/2016   3 vessel coronary calcification noted on CT scan 04/03/15    . Depression   . Fatty liver, alcoholic 06/09/2978  . Hypertension   . Increased ammonia level   . Insomnia   . NSTEMI (non-ST elevated myocardial infarction) ([place])   . Prostate cancer ([place]) 2010   External beam radiation (urol - Risa Grill, XRT Valere Dross)  . Stroke ([place])   . Tachycardia     Medications:  Scheduled:  . aspirin EC  81 mg Oral Daily  . atorvastatin  40 mg Oral q1800  . buPROPion  200 mg Oral Daily  .  carvedilol  12.5 mg Oral BID  . cholecalciferol  1,000 Units Oral BID  . folic acid  1 mg Intravenous Daily  . magnesium oxide  400 mg Oral QHS  . mouth rinse  15 mL Mouth Rinse BID  . mirtazapine  7.5 mg Oral QHS  . nitroGLYCERIN  0.5 inch Topical Q8H  . omega-3 acid ethyl esters  1 g Oral Daily  . potassium chloride SA  40 mEq Oral Daily  . QUEtiapine  25 mg Oral QAC breakfast  . tamsulosin  0.4 mg Oral Daily  . thiamine injection  100 mg Intravenous Daily  . triamterene-hydrochlorothiazide  1 tablet Oral Daily  Infusions:  . heparin 1,200 Units/hr (07/01/18 1712)    Assessment: 36 yoM c/o generalized weakness x 24 hours. Troponin 0.72 elevated on admission with a history of CAD. Pharmacy consulted to dose/monitor heparin drip for ACS.  Patient was not on anticoagulant prior to admission. Baseline labs obtained.   7/14 Hgb 14, Plt 250  HL = 0.35 is therapeutic on heparin infusion of 1000 units/hr  Confirmatory HL is 0.24, subtherapeutic  No issues with infusion per RN Today, 7/15  0043 HL = 0.37 at goal, no infusion or bleeding issues per RN.   Goal of Therapy:  Heparin level 0.3-0.7 units/ml Monitor platelets by anticoagulation protocol: Yes   Plan:   Continue heparin drip at 1200 units/hr  Recheck a confirmatory HL today  CBC and HL daily  Monitor for any signs/symptoms of bleeding    Dorrene German 07/02/2018 2:31 AM

## 2018-07-02 NOTE — Progress Notes (Signed)
PROGRESS NOTE  Joshua Henson UMP:[id number] DOB: May 19, 1946 DOA: 06/30/2018 PCP: Clovia Cuff, MD  HPI/Recap of past 24 hours: [name] a72 y.o.male,w prostate cancer, etoh dep, cirrhosis, CVA, CAD Who presents with nausea and vomitting of 1 day duration.  Troponin elevated on presentation.  Cardiology consulted.  Admitted for elevated troponin/demand ischemia versus NSTEMI and started on heparin drip.  2D echo done on 07/02/2018 revealed preserved LVEF with diastolic dysfunction..  07/02/2018: Patient seen and examined at his bedside.  He has no new complaints.  He denies chest pain, palpitations, or dyspnea.  Denies any nausea or abdominal pain.    Assessment/Plan: Principal Problem:   Elevated troponin Active Problems:   Nausea & vomiting  Elevated troponin secondary to demand ischemia versus NSTEMI Troponin flat at 0.67 Twelve-lead EKG done on 06/30/2018 personally reviewed revealed sinus rhythm with no specific ST-T changes On heparin drip on admission Cardiology consulted and will see the patient today Continue monitor on telemetry N.p.o. unless otherwise stated by cardiology  AKI on CKD 3 Creatinine on presentation 1.57 Baseline creatinine 1.2 Creatinine today 1.27 Avoid nephrotoxic agents/hypotension/dehydration GFR 56 Repeat BMP in the morning  Chronic diastolic CHF Last 2D echo done on 07/01/2018 revealed LVEF 60 to 65% and grade 1 diastolic dysfunction Strict I&O and daily weight Continue cardiac medications  Coronary artery disease Continue aspirin, Lipitor, Coreg  Alcohol dependence with concern for alcohol withdrawal Continue CIWA protocol Continue thiamine and folate vitamins  Hypertension Continue Coreg  BPH Continue Flomax  Chronic depression/anxiety Continue Remeron, Seroquel, and bupropion   Code Status: Full code  Family Communication: None at bedside  Disposition Plan: Home when cardiology signs  off   Consultants:  Cardiology  Procedures:  None  Antimicrobials:  None  DVT prophylaxis: Heparin drip   Objective: Vitals:   07/02/18 0754 07/02/18 1018 07/02/18 1200 07/02/18 1237  BP:  (!) 147/92    Pulse:  80 67 69  Resp:  18 17   Temp: 98.3 F (36.8 C)  98.5 F (36.9 C)   TempSrc: Oral  Oral   SpO2:  100% 98%   Weight:      Height:        Intake/Output Summary (Last 24 hours) at 07/02/2018 1245 Last data filed at 07/02/2018 0800 Gross per 24 hour  Intake 620.85 ml  Output 975 ml  Net -354.15 ml   Filed Weights   06/30/18 2300 07/01/18 0100  Weight: 95.3 kg (210 lb) 93 kg (205 lb 0.4 oz)    Exam:  . General: 72 y.o. year-old male well developed well nourished in no acute distress.  Alert and oriented x3.  Hard of hearing. . Cardiovascular: Regular rate and rhythm with no rubs or gallops.  No thyromegaly or JVD noted.   Marland Kitchen Respiratory: Clear to auscultation with no wheezes or rales. Good inspiratory effort. . Abdomen: Soft nontender nondistended with normal bowel sounds x4 quadrants. . Musculoskeletal: No lower extremity edema. 2/4 pulses in all 4 extremities. . Skin: No ulcerative lesions noted or rashes . Psychiatry: Mood is appropriate for condition and setting   Data Reviewed: CBC: Recent Labs  Lab 06/30/18 2000 07/01/18 0830 07/02/18 0043  WBC 11.5* 9.4  9.6 7.5  NEUTROABS 10.3*  --   --   HGB 16.9 14.0  14.0 14.3  HCT 46.5 39.1  38.6* 41.1  MCV 94.1 97.0  98.5 97.6  PLT 295 239  250 400   Basic Metabolic Panel: Recent Labs  Lab 06/30/18 2000 07/01/18  0830 07/02/18 0043  NA 144 142 142  K 5.4* 3.2* 3.8  CL 102 107 107  CO2 20* 23 24  GLUCOSE 170* 132* 112*  BUN 36* 35* 30*  CREATININE 1.57* 1.40* 1.27*  CALCIUM 10.4* 8.7* 8.7*   GFR: Estimated Creatinine Clearance: 62 mL/min (A) (by C-G formula based on SCr of 1.27 mg/dL (H)). Liver Function Tests: Recent Labs  Lab 06/30/18 2000 07/01/18 0830  AST 41 36  ALT 33  28  ALKPHOS 73 58  BILITOT 2.0* 1.5*  PROT 8.4* 6.7  ALBUMIN 4.8 3.7   Recent Labs  Lab 06/30/18 2000  LIPASE 43   Recent Labs  Lab 06/30/18 2053  AMMONIA 34   Coagulation Profile: Recent Labs  Lab 06/30/18 2302  INR 1.08   Cardiac Enzymes: Recent Labs  Lab 06/30/18 2000 07/01/18 0052 07/01/18 0830 07/01/18 1249  CKTOTAL  --  409*  --   --   CKMB  --  4.1  --   --   TROPONINI 0.72* 0.57* 0.65* 0.65*   BNP (last 3 results) No results for input(s): PROBNP in the last 8760 hours. HbA1C: Recent Labs    07/01/18 0830  HGBA1C 5.6   CBG: No results for input(s): GLUCAP in the last 168 hours. Lipid Profile: Recent Labs    07/01/18 0830  CHOL 145  HDL 66  LDLCALC 63  TRIG 79  CHOLHDL 2.2   Thyroid Function Tests: No results for input(s): TSH, T4TOTAL, FREET4, T3FREE, THYROIDAB in the last 72 hours. Anemia Panel: No results for input(s): VITAMINB12, FOLATE, FERRITIN, TIBC, IRON, RETICCTPCT in the last 72 hours. Urine analysis:    Component Value Date/Time   COLORURINE YELLOW 07/01/2018 0902   APPEARANCEUR CLEAR 07/01/2018 0902   LABSPEC 1.025 07/01/2018 0902   PHURINE 5.0 07/01/2018 0902   GLUCOSEU NEGATIVE 07/01/2018 0902   HGBUR NEGATIVE 07/01/2018 0902   BILIRUBINUR NEGATIVE 07/01/2018 0902   KETONESUR 20 (A) 07/01/2018 0902   PROTEINUR NEGATIVE 07/01/2018 0902   UROBILINOGEN 2.0 (H) 04/02/2015 2013   NITRITE NEGATIVE 07/01/2018 0902   LEUKOCYTESUR NEGATIVE 07/01/2018 0902   Sepsis Labs: @LABRCNTIP (procalcitonin:4,lacticidven:4)  ) Recent Results (from the past 240 hour(s))  MRSA PCR Screening     Status: None   Collection Time: 06/30/18  7:13 PM  Result Value Ref Range Status   MRSA by PCR NEGATIVE NEGATIVE Final    Comment:        The GeneXpert MRSA Assay (FDA approved for NASAL specimens only), is one component of a comprehensive MRSA colonization surveillance program. It is not intended to diagnose MRSA infection nor to guide  or monitor treatment for MRSA infections. Performed at [facility], [place] [address], [place], [place] [address]       Studies: No results found.  Scheduled Meds: . aspirin EC  81 mg Oral Daily  . atorvastatin  40 mg Oral q1800  . buPROPion  200 mg Oral Daily  . carvedilol  12.5 mg Oral BID  . cholecalciferol  1,000 Units Oral BID  . folic acid  1 mg Intravenous Daily  . magnesium oxide  400 mg Oral QHS  . mouth rinse  15 mL Mouth Rinse BID  . mirtazapine  7.5 mg Oral QHS  . nitroGLYCERIN  0.5 inch Topical Q8H  . omega-3 acid ethyl esters  1 g Oral Daily  . potassium chloride SA  40 mEq Oral Daily  . QUEtiapine  25 mg Oral QAC breakfast  . tamsulosin  0.4 mg Oral Daily  . thiamine injection  100 mg Intravenous Daily  . triamterene-hydrochlorothiazide  1 tablet Oral Daily    Continuous Infusions: . heparin       LOS: 2 days     Kayleen Memos, MD [facility] Pager [phone]  If 7PM-7AM, please contact night-coverage [email or web address] Password [facility] 07/02/2018, 12:45 PM

## 2018-07-02 NOTE — Consult Note (Addendum)
Cardiology Consultation:   Patient ID: Joshua Henson; [id number]; 11/02/1946   Admit date: 06/30/2018 Date of Consult: 07/02/2018  Primary Care Provider: Clovia Cuff, MD Primary Cardiologist: Mertie Moores, MD  Primary Electrophysiologist:  NA   Patient Profile:   Joshua Henson is a 72 y.o. male with a hx of resting tachycardia, stroke, CKD, HTN, hld, alcoholism,  Prostate cancer, cirrhosis and 3 vessel CAD per CT angio of chest 2017 who is being seen today for the evaluation of CAD with elevated troponin of pk 0.72 at the request of Dr. Nevada Crane after admit for N&V and fatigue.  History of Present Illness:   Mr. Ferencz saw Dr. Acie Fredrickson in 2016 for tachycardia, other hx includes CVA, CKD, HTN, HLD, ETOH dependence, prostate cancer, cirrhosis and per CTA of chest in 2017 Coronary artery calcifications, particularly dense within the left anterior descending coronary artery.  Has had acute pancreatitis as well.   He has had cath 01/15/16 with moderately severe stenosis in mid, non dominant RCA and mild non obstructive disease in the LAD and LCX.  It was done for elevated troponins, demand ischemia with ETOH withdrawal,  Per Dr. Angelena Form " While he does have a 60-70% stenosis in the small to moderate caliber mid RCA, I do not think this is clinically significant. The RCA is a non-dominant vessel. I would recommend medical therapy for CAD at this time. He would seem to be a poor candidate for a coronary stent given his alcohol abuse and possible abuse of other substances (benzo/cocaine). "  Admitted 06/30/18 with fatigue, N&V.  He denied chest pain.  CXR with no active cardiopulmonary disease.  EKG I personally reviewed with SR minimal ant. T wave abnormalities though similar to prior EKGs.   Labs Troponin 0.72; 0.57; 0.65;0.65  Cr 1.57 on admit now 1.27, Na 144, K+ 5.4 now 3.8, T bili 2.0, now 1.5  Tchol 145, LDL 63 and HDL 66   WBC on admit 11.5 now 7.5, Hgb 16.9, plts 295  hgbA1C 5.6 Drug  screen + marijuana and benzo  Echo 07/01/18 with EF 60-65% G1DD,   Currently pt is on IV heparin.    He has been NPO but will allow diet, cath board is full today and pt is without pain.   Past Medical History:  Diagnosis Date  . Acute hepatic encephalopathy   . Alcoholic cirrhosis of liver without ascites ([place])   . Anxiety state   . CAD (coronary artery disease), native coronary artery 01/10/2016   3 vessel coronary calcification noted on CT scan 04/03/15    . Depression   . Fatty liver, alcoholic 08/22/91  . Hypertension   . Increased ammonia level   . Insomnia   . NSTEMI (non-ST elevated myocardial infarction) ([place])   . Prostate cancer ([place]) 2010   External beam radiation (urol - Risa Grill, XRT Valere Dross)  . Stroke ([place])   . Tachycardia     Past Surgical History:  Procedure Laterality Date  . CARDIAC CATHETERIZATION N/A 01/15/2016   Procedure: Left Heart Cath and Coronary Angiography;  Surgeon: Burnell Blanks, MD;  Location: [place];  Service: Cardiovascular;  Laterality: N/A;     Home Medications:  Prior to Admission medications   Medication Sig Start Date End Date Taking? Authorizing Provider  aspirin 81 MG EC tablet Take 1 tablet (81 mg total) by mouth daily. 07/11/14  Yes Angiulli, Lavon Paganini, PA-C  atorvastatin (LIPITOR) 40 MG tablet Take 1 tablet (40 mg total)  by mouth daily at 6 PM. 01/19/16  Yes Allie Bossier, MD  buPROPion [facility] SR) 200 MG 12 hr tablet Take 200 mg by mouth daily.   Yes [provider]  carvedilol (COREG) 12.5 MG tablet Take 1 tablet (12.5 mg total) by mouth 2 (two) times daily. 02/23/17  Yes Florencia Reasons, MD  cholecalciferol (VITAMIN D) 1000 units tablet Take 1,000 Units by mouth 2 (two) times daily.    Yes [provider]  Coenzyme Q10 (CO Q-10) 200 MG CAPS Take 200 mg by mouth daily.   Yes [provider]  folic acid (FOLVITE) 1 MG tablet take 1 tablet by mouth once daily 01/12/17  Yes Medina-Vargas, Monina C,  NP  LORazepam (ATIVAN) 1 MG tablet Take 1 tablet (1 mg total) by mouth 2 (two) times daily as needed for anxiety. 02/23/17  Yes Florencia Reasons, MD  Magnesium 500 MG CAPS Take 500 mg by mouth at bedtime.   Yes [provider]  mirtazapine (REMERON) 7.5 MG tablet take 1 tablet by mouth at bedtime Patient taking differently: take 15mg  by mouth at bedtime 01/12/17  Yes Medina-Vargas, Monina C, NP  nitroGLYCERIN (NITROSTAT) 0.4 MG SL tablet Place 1 tablet (0.4 mg total) under the tongue every 5 (five) minutes as needed for chest pain. 05/13/16  Yes Velvet Bathe, MD  Omega-3 Fatty Acids (FISH OIL) 1000 MG CAPS Take 1,000 mg by mouth 2 (two) times daily.    Yes [provider]  potassium chloride SA (K-DUR,KLOR-CON) 20 MEQ tablet take 2 tablets by mouth once daily Patient taking differently: take 1 tablets by mouth once daily 01/12/17  Yes Medina-Vargas, Monina C, NP  QUEtiapine (SEROQUEL) 25 MG tablet Take 1 tablet (25 mg total) by mouth daily before breakfast. 11/22/17  Yes Charlynne Cousins, MD  tamsulosin [facility]) 0.4 MG CAPS capsule take 1 capsule by mouth once daily 01/12/17  Yes Medina-Vargas, Monina C, NP  thiamine (VITAMIN B-1) 100 MG tablet Take 100 mg by mouth daily.   Yes [provider]  triamterene-hydrochlorothiazide (DYAZIDE) 37.5-25 MG capsule Take 1 capsule by mouth daily. 10/04/17  Yes [provider]  QUEtiapine (SEROQUEL) 200 MG tablet Take 1 tablet (200 mg total) by mouth at bedtime. Patient not taking: Reported on 06/30/2018 11/21/17   Charlynne Cousins, MD  senna-docusate (SENOKOT-S) 8.6-50 MG tablet Take 1 tablet by mouth at bedtime. Patient not taking: Reported on 11/09/2017 02/23/17   Florencia Reasons, MD    Inpatient Medications: Scheduled Meds: . aspirin EC  81 mg Oral Daily  . atorvastatin  40 mg Oral q1800  . buPROPion  200 mg Oral Daily  . carvedilol  12.5 mg Oral BID  . cholecalciferol  1,000 Units Oral BID  . folic acid  1 mg Intravenous Daily    . magnesium oxide  400 mg Oral QHS  . mouth rinse  15 mL Mouth Rinse BID  . mirtazapine  7.5 mg Oral QHS  . nitroGLYCERIN  0.5 inch Topical Q8H  . omega-3 acid ethyl esters  1 g Oral Daily  . potassium chloride SA  40 mEq Oral Daily  . QUEtiapine  25 mg Oral QAC breakfast  . tamsulosin  0.4 mg Oral Daily  . thiamine injection  100 mg Intravenous Daily  . triamterene-hydrochlorothiazide  1 tablet Oral Daily   Continuous Infusions: . heparin     PRN Meds: acetaminophen **OR** acetaminophen, LORazepam, ondansetron (ZOFRAN) IV  Allergies:    Allergies  Allergen Reactions  .  Lisinopril     syncope  . Celecoxib Rash    Social History:   Social History   Socioeconomic History  . Marital status: Divorced    Spouse name: Not on file  . Number of children: 3  . Years of education: 56  . Highest education level: Not on file  Occupational History  . Occupation: Chief Executive Officer  Social Needs  . Financial resource strain: Not on file  . Food insecurity:    Worry: Not on file    Inability: Not on file  . Transportation needs:    Medical: Not on file    Non-medical: Not on file  Tobacco Use  . Smoking status: Current Every Day Smoker    Packs/day: 0.50    Years: 30.00    Pack years: 15.00    Types: Cigarettes  . Smokeless tobacco: Never Used  Substance and Sexual Activity  . Alcohol use: Yes    Alcohol/week: 33.6 oz    Types: 56 Shots of liquor per week    Comment: last drink yesterday  . Drug use: No    Comment: Cocaine intermittently  . Sexual activity: Yes  Lifestyle  . Physical activity:    Days per week: Not on file    Minutes per session: Not on file  . Stress: Not on file  Relationships  . Social connections:    Talks on phone: Not on file    Gets together: Not on file    Attends religious service: Not on file    Active member of club or organization: Not on file    Attends meetings of clubs or organizations: Not on file    Relationship status: Not on file  .  Intimate partner violence:    Fear of current or ex partner: Not on file    Emotionally abused: Not on file    Physically abused: Not on file    Forced sexual activity: Not on file  Other Topics Concern  . Not on file  Social History Narrative   HSG, [employer], [place]. Married 22 yrs yrs - divorced. Twin boys - 40; 1 dtr - '94. Pension scheme manager.     Family History:    Family History  Problem Relation Age of Onset  . Cancer Mother        esophageal  . Hypertension Mother   . Diabetes Mother   . Cancer Father        prostate  . Heart disease Maternal Grandfather        MI  . Cancer Paternal Grandfather        prostate     ROS:  Please see the history of present illness.  General:no colds or fevers, no weight changes Skin:no rashes or ulcers HEENT:no blurred vision, no congestion CV:see HPI PUL:see HPI GI:no diarrhea constipation or melena, no indigestion GU:no hematuria, no dysuria MS:no joint pain, no claudication Neuro:no syncope, no lightheadedness Endo:no diabetes, no thyroid disease  All other ROS reviewed and negative.     Physical Exam/Data:   Vitals:   07/02/18 0754 07/02/18 1018 07/02/18 1200 07/02/18 1237  BP:  (!) 147/92    Pulse:  80 67 69  Resp:  18 17   Temp: 98.3 F (36.8 C)  98.5 F (36.9 C)   TempSrc: Oral  Oral   SpO2:  100% 98%   Weight:      Height:        Intake/Output Summary (Last 24 hours) at 07/02/2018  1330 Last data filed at 07/02/2018 0800 Gross per 24 hour  Intake 620.85 ml  Output 975 ml  Net -354.15 ml   Filed Weights   06/30/18 2300 07/01/18 0100  Weight: 210 lb (95.3 kg) 205 lb 0.4 oz (93 kg)   Body mass index is 29.42 kg/m.  General:  Well nourished, well developed, in no acute distress though easily agitated.  HEENT: normal does wear hearing aid. Lymph: no adenopathy Neck: no JVD Endocrine:  No thryomegaly Vascular: No carotid bruits; pedal pulses 2+ bilaterally   Cardiac:  normal S1, S2; RRR;  no murmur, gallup rub or click  Lungs:  clear to auscultation bilaterally, no wheezing, rhonchi or rales  Abd: soft, nontender, no hepatomegaly  Ext: no edema Musculoskeletal:  No deformities, BUE and BLE strength normal and equal Skin: warm and dry  Neuro:  Alert and oriented X 3 MAE follows commands no focal abnormalities noted Psych:  Normal affect    Relevant CV Studies: Cardiac cath 01/15/16 1. Moderately severe stenosis in the mid segment of the non-dominant RCA.  2. Mild non-obstructive disease in the LAD and Circumflex  Recommendations: I have had a long discussion today with his girlfriend who is his HCPOA. She reports that he has had no exertional chest pain at home. He was admitted due to alcohol intoxication and withdrawal. He has had a complicated hospital course due to delirium tremens with respiratory failure/aspiration pneumonia. Troponin was elevated, likely due to demand ischemia. While he does have a 60-70% stenosis in the small to moderate caliber mid RCA, I do not think this is clinically significant. The RCA is a non-dominant vessel. I would recommend medical therapy for CAD at this time. He would seem to be a poor candidate for a coronary stent given his alcohol abuse and possible abuse of other substances (benzo/cocaine). I would restart his statin and continue ASA along with a beta blocker.    Echo 07/01/18  Study Conclusions  - Left ventricle: The cavity size was normal. Wall thickness was   normal. Systolic function was normal. The estimated ejection   fraction was in the range of 60% to 65%. Wall motion was normal;   there were no regional wall motion abnormalities. Doppler   parameters are consistent with abnormal left ventricular   relaxation (grade 1 diastolic dysfunction). - Aortic valve: Valve area (VTI): 3.32 cm^2. Valve area (Vmax):   3.15 cm^2. Valve area (Vmean): 3.17 cm^2.  Laboratory Data:  Chemistry Recent Labs  Lab 06/30/18 2000  07/01/18 0830 07/02/18 0043  NA 144 142 142  K 5.4* 3.2* 3.8  CL 102 107 107  CO2 20* 23 24  GLUCOSE 170* 132* 112*  BUN 36* 35* 30*  CREATININE 1.57* 1.40* 1.27*  CALCIUM 10.4* 8.7* 8.7*  GFRNONAA 43* 49* 56*  GFRAA 50* 57* >60  ANIONGAP 22* 12 11    Recent Labs  Lab 06/30/18 2000 07/01/18 0830  PROT 8.4* 6.7  ALBUMIN 4.8 3.7  AST 41 36  ALT 33 28  ALKPHOS 73 58  BILITOT 2.0* 1.5*   Hematology Recent Labs  Lab 06/30/18 2000 07/01/18 0830 07/02/18 0043  WBC 11.5* 9.4  9.6 7.5  RBC 4.94 4.03*  3.92* 4.21*  HGB 16.9 14.0  14.0 14.3  HCT 46.5 39.1  38.6* 41.1  MCV 94.1 97.0  98.5 97.6  MCH 34.2* 34.7*  35.7* 34.0  MCHC 36.3* 35.8  36.3* 34.8  RDW 15.4 15.4  15.6* 15.5  PLT 295 239  250 191   Cardiac Enzymes Recent Labs  Lab 06/30/18 2000 07/01/18 0052 07/01/18 0830 07/01/18 1249  TROPONINI 0.72* 0.57* 0.65* 0.65*   No results for input(s): TROPIPOC in the last 168 hours.  BNPNo results for input(s): BNP, PROBNP in the last 168 hours.  DDimer No results for input(s): DDIMER in the last 168 hours.  Radiology/Studies:  Dg Chest 2 View  Result Date: 06/30/2018 CLINICAL DATA:  Pt from home with complaint of generalized weakness for the past 24 hours. Pt. stated that he was nauseous and has vomited several times this morning. EXAM: CHEST - 2 VIEW COMPARISON:  11/15/2017 FINDINGS: Cardiac silhouette is normal in size and configuration. No mediastinal or hilar masses or evidence of adenopathy. Clear lungs.  No pleural effusion or pneumothorax. Skeletal structures are intact. IMPRESSION: No active cardiopulmonary disease. Electronically Signed   By: Lajean Manes M.D.   On: 06/30/2018 20:54   Ct Head Wo Contrast  Result Date: 06/30/2018 CLINICAL DATA:  72 year old male with altered mental status. EXAM: CT HEAD WITHOUT CONTRAST TECHNIQUE: Contiguous axial images were obtained from the base of the skull through the vertex without intravenous contrast.  COMPARISON:  Head CT dated 10/21/2016 FINDINGS: Brain: The ventricles and sulci appropriate size for patient's age. Mild periventricular and deep white matter chronic microvascular ischemic changes noted. There is no acute intracranial hemorrhage. No mass effect or midline shift. No extra-axial fluid collection. Vascular: No hyperdense vessel or unexpected calcification. Skull: Normal. Negative for fracture or focal lesion. Sinuses/Orbits: There is partial opacification of the maxillary sinuses with remodeling of the walls of the right maxillary sinus consistent with chronic sinusitis. The mastoid air cells are clear. There is no air-fluid levels. Stable foci of calcification over the left globe similar to prior CT. Other: None IMPRESSION: 1. No acute intracranial pathology. 2. Mild age-related atrophy and chronic microvascular ischemic changes. 3. Chronic appearing bilateral maxillary sinus disease. Electronically Signed   By: Anner Crete M.D.   On: 06/30/2018 21:54    Assessment and Plan:   1. Elevated troponins mildly with no chest pain.  He did have N&V and fatigue.  No acute EKG changes. Nonobstructive CAD in 2017.  Echo is stable.  Dr. Harrington Challenger to see.  Is on BB, statin asa.  Elevation could be related to his RCA lesion.  No chest  Pain prior to admit.    2.    CAD - non obstructive on cath 2017.  Is on ASA  3.    HLD on statin and LDL is controlled  4.    HTN  Now controlled    5.    CKD stable with GFR at 56    For questions or updates, please contact [place] Please consult [email or web address] for contact info under Cardiology/STEMI.   Signed, Cecilie Kicks, NP  07/02/2018 1:30 PM   Pt seen and examined  I agree with findings as noted above by L Ingold Pt is a 72 yo with known CAD  Cath in 2017  Admitted with N/V for 3 days    Pt r/i for MI with peak troponin 0.65 The pt denies CP   Breathing is OK  ON exam: Lungs are CTA   Cardac RRR   No S3     Ext without  edema   Impression: Troponin is minimally elevated    May represent demand ischeima in setting of acute illness and CAD     I would recomm continuing to follow clinically  Walk pt   Follow   I would not plan any cardiac testing at present   The pt has had no CP  Dorris Carnes

## 2018-07-02 NOTE — Progress Notes (Signed)
ANTICOAGULATION CONSULT NOTE - Follow-Up Consult  Pharmacy Consult for IV heparin Indication: chest pain/ACS  Allergies  Allergen Reactions  . Lisinopril     syncope  . Celecoxib Rash    Patient Measurements: Height: 5\' 10"  (177.8 cm) Weight: 205 lb 0.4 oz (93 kg) IBW/kg (Calculated) : 73 Heparin Dosing Weight: 92 kg  Vital Signs: Temp: 98.5 F (36.9 C) (07/15 1200) Temp Source: Oral (07/15 1200) BP: 147/92 (07/15 1018) Pulse Rate: 67 (07/15 1200)  Labs: Recent Labs    06/30/18 2000 06/30/18 2302 07/01/18 0052  07/01/18 0830 07/01/18 1249 07/01/18 1612 07/02/18 0043 07/02/18 1111  HGB 16.9  --   --   --  14.0  14.0  --   --  14.3  --   HCT 46.5  --   --   --  39.1  38.6*  --   --  41.1  --   PLT 295  --   --   --  239  250  --   --  191  --   APTT  --  29  --   --   --   --   --   --   --   LABPROT  --  13.9  --   --   --   --   --   --   --   INR  --  1.08  --   --   --   --   --   --   --   HEPARINUNFRC  --   --   --    < > 0.35  --  0.24* 0.37 0.61  CREATININE 1.57*  --   --   --  1.40*  --   --  1.27*  --   CKTOTAL  --   --  409*  --   --   --   --   --   --   CKMB  --   --  4.1  --   --   --   --   --   --   TROPONINI 0.72*  --  0.57*  --  0.65* 0.65*  --   --   --    < > = values in this interval not displayed.    Estimated Creatinine Clearance: 62 mL/min (A) (by C-G formula based on SCr of 1.27 mg/dL (H)).   Medical History: Past Medical History:  Diagnosis Date  . Acute hepatic encephalopathy   . Alcoholic cirrhosis of liver without ascites ([place])   . Anxiety state   . CAD (coronary artery disease), native coronary artery 01/10/2016   3 vessel coronary calcification noted on CT scan 04/03/15    . Depression   . Fatty liver, alcoholic 7/74/1287  . Hypertension   . Increased ammonia level   . Insomnia   . NSTEMI (non-ST elevated myocardial infarction) ([place])   . Prostate cancer ([place]) 2010   External beam radiation (urol - Risa Grill, XRT Valere Dross)   . Stroke ([place])   . Tachycardia     Medications:  Scheduled:  . aspirin EC  81 mg Oral Daily  . atorvastatin  40 mg Oral q1800  . buPROPion  200 mg Oral Daily  . carvedilol  12.5 mg Oral BID  . cholecalciferol  1,000 Units Oral BID  . folic acid  1 mg Intravenous Daily  . magnesium oxide  400 mg Oral QHS  . mouth rinse  15 mL Mouth Rinse  BID  . mirtazapine  7.5 mg Oral QHS  . nitroGLYCERIN  0.5 inch Topical Q8H  . omega-3 acid ethyl esters  1 g Oral Daily  . potassium chloride SA  40 mEq Oral Daily  . QUEtiapine  25 mg Oral QAC breakfast  . tamsulosin  0.4 mg Oral Daily  . thiamine injection  100 mg Intravenous Daily  . triamterene-hydrochlorothiazide  1 tablet Oral Daily   Infusions:  . heparin 1,200 Units/hr (07/01/18 1712)    Assessment: 78 yoM c/o generalized weakness x 24 hours. Troponin 0.72 elevated on admission with a history of CAD. Pharmacy consulted to dose/monitor heparin drip for ACS.  Patient was not on anticoagulant prior to admission. Baseline labs obtained.  Today, 07/02/2018  Heparin level therapeutic (0.61) but trending up on heparin infusion of 1200 units/hr  CBC: Hgb 14.3 (stable, Plts WNL  No issues with infusion or bleeding per RN  Goal of Therapy:  Heparin level 0.3-0.7 units/ml Monitor platelets by anticoagulation protocol: Yes   Plan:   Decrease heparin slightly to 1150 units/hr to prevent further increase into supratherapeutic range  CBC and HL daily  Monitor for any signs/symptoms of bleeding  Peggyann Juba, PharmD, BCPS Pager: [phone] 07/02/2018 12:39 PM

## 2018-07-03 LAB — BASIC METABOLIC PANEL
ANION GAP: 8 (ref 5–15)
BUN: 23 mg/dL (ref 8–23)
CO2: 24 mmol/L (ref 22–32)
Calcium: 8.7 mg/dL — ABNORMAL LOW (ref 8.9–10.3)
Chloride: 108 mmol/L (ref 98–111)
Creatinine, Ser: 1.19 mg/dL (ref 0.61–1.24)
GFR calc Af Amer: >60 mL/min (ref >60)
GFR calc non Af Amer: >60 mL/min (ref >60)
GLUCOSE: 107 mg/dL — AB (ref 70–99)
POTASSIUM: 3.3 mmol/L — AB (ref 3.5–5.1)
SODIUM: 140 mmol/L (ref 135–145)

## 2018-07-03 LAB — CBC
HEMATOCRIT: 40.5 % (ref 39.0–52.0)
HEMOGLOBIN: 14.1 g/dL (ref 13.0–17.0)
MCH: 33.5 pg (ref 26.0–34.0)
MCHC: 34.8 g/dL (ref 30.0–36.0)
MCV: 96.2 fL (ref 78.0–100.0)
Platelets: 179 10*3/uL (ref 150–400)
RBC: 4.21 MIL/uL — AB (ref 4.22–5.81)
RDW: 14.9 % (ref 11.5–15.5)
WBC: 6.8 10*3/uL (ref 4.0–10.5)

## 2018-07-03 LAB — HEPARIN LEVEL (UNFRACTIONATED): Heparin Unfractionated: 0.48 IU/mL (ref 0.30–0.70)

## 2018-07-03 MED ORDER — TAMSULOSIN HCL 0.4 MG PO CAPS: 0.4000 mg | ORAL_CAPSULE | Freq: Every day | ORAL | 0 refills | Status: AC

## 2018-07-03 MED ORDER — SENNOSIDES-DOCUSATE SODIUM 8.6-50 MG PO TABS
1.0000 | ORAL_TABLET | Freq: Two times a day (BID) | ORAL | Status: DC
  Filled 2018-07-03: qty 1

## 2018-07-03 MED ORDER — VITAMIN B-1 100 MG PO TABS: 100.0000 mg | ORAL_TABLET | Freq: Every day | ORAL | Status: DC

## 2018-07-03 MED ORDER — POTASSIUM CHLORIDE CRYS ER 20 MEQ PO TBCR
40.0000 meq | EXTENDED_RELEASE_TABLET | Freq: Once | ORAL | Status: AC
  Administered 2018-07-03: 40 meq via ORAL
  Filled 2018-07-03: qty 2

## 2018-07-03 MED ORDER — FOLIC ACID 1 MG PO TABS: 1.0000 mg | ORAL_TABLET | Freq: Every day | ORAL | Status: DC

## 2018-07-03 MED ORDER — POLYETHYLENE GLYCOL 3350 17 G PO PACK: 17.0000 g | PACK | Freq: Every day | ORAL | 0 refills | Status: DC

## 2018-07-03 MED ORDER — POTASSIUM CHLORIDE CRYS ER 20 MEQ PO TBCR
40.0000 meq | EXTENDED_RELEASE_TABLET | Freq: Every day | ORAL | Status: DC
  Administered 2018-07-03: 40 meq via ORAL
  Filled 2018-07-03: qty 2

## 2018-07-03 MED ORDER — POLYETHYLENE GLYCOL 3350 17 G PO PACK
17.0000 g | PACK | Freq: Every day | ORAL | Status: DC
  Filled 2018-07-03: qty 1

## 2018-07-03 NOTE — Progress Notes (Signed)
IV access removed, discharge instructions reviewed with patient and wife. Pt wheeled downstairs via wheelchair for discharge by nursing tech

## 2018-07-03 NOTE — Evaluation (Signed)
Physical Therapy Evaluation Patient Details Name: Joshua Henson MRN: [id number] DOB: [birth year] Today's Date: 07/03/2018   History of Present Illness  Joshua Henson  is a 72 y.o. male, w prostate cancer, etoh dep, cirrhosis,  CVA, CAD  Who presents with nausea and vomitting of 1 day duration.  Troponin elevated on presentation.  Cardiology consulted.  Admitted for elevated troponin/demand ischemia versus NSTEMI and started on heparin drip.  Clinical Impression  Patient presents close to functional baseline for mobility.  Has Meniere's disease per report and some evidence for imbalance noted.  He makes some compensations for issues at home and has not fallen in past 6 months.  Feel he is stable for d/c home with intermittent assist from girlfriend and no follow up PT at this time.  Will sign off due to potential d/c home soon.    Follow Up Recommendations No PT follow up    Equipment Recommendations  None recommended by PT    Recommendations for Other Services       Precautions / Restrictions Precautions Precautions: Fall Precaution Comments: last fall in Dec, reports has Meniere's  Restrictions Weight Bearing Restrictions: No      Mobility  Bed Mobility Overal bed mobility: Independent                Transfers Overall transfer level: Modified independent               General transfer comment: some evidence of fall risk sits with uncontrolled descent  Ambulation/Gait Ambulation/Gait assistance: Min guard;Supervision Gait Distance (Feet): 300 Feet Assistive device: None Gait Pattern/deviations: Step-through pattern;Decreased stride length;Drifts right/left     General Gait Details: one LOB initially in room with self recovery held onto footboard on bed  Stairs            Wheelchair Mobility    Modified Rankin (Stroke Patients Only)       Balance Overall balance assessment: Mild deficits observed, not formally tested                                            Pertinent Vitals/Pain Pain Assessment: No/denies pain    Home Living Family/patient expects to be discharged to:: Private residence Living Arrangements: Spouse/significant other Available Help at Discharge: Family;Available PRN/intermittently Type of Home: House(condo)       Home Layout: Two level;Able to live on main level with bedroom/bathroom Home Equipment: Shower seat - built in;Cane - single point;Walker - 2 wheels;Grab bars - tub/shower      Prior Function Level of Independence: Independent               Hand Dominance   Dominant Hand: Right    Extremity/Trunk Assessment   Upper Extremity Assessment Upper Extremity Assessment: Overall WFL for tasks assessed    Lower Extremity Assessment Lower Extremity Assessment: Overall WFL for tasks assessed       Communication   Communication: HOH  Cognition Arousal/Alertness: Awake/alert Behavior During Therapy: WFL for tasks assessed/performed Overall Cognitive Status: Within Functional Limits for tasks assessed                                        General Comments General comments (skin integrity, edema, etc.): reviewed fall prevention tips including footwear, lighting, removing environmental hazards and  carrying cane outdoors    Exercises     Assessment/Plan    PT Assessment Patent does not need any further PT services  PT Problem List         PT Treatment Interventions      PT Goals (Current goals can be found in the Care Plan section)  Acute Rehab PT Goals PT Goal Formulation: All assessment and education complete, DC therapy    Frequency     Barriers to discharge        Co-evaluation               AM-PAC PT "6 Clicks" Daily Activity  Outcome Measure Difficulty turning over in bed (including adjusting bedclothes, sheets and blankets)?: A Little Difficulty moving from lying on back to sitting on the side of the bed? : A  Little Difficulty sitting down on and standing up from a chair with arms (e.g., wheelchair, bedside commode, etc,.)?: A Little Help needed moving to and from a bed to chair (including a wheelchair)?: A Little Help needed walking in hospital room?: A Little Help needed climbing 3-5 steps with a railing? : A Little 6 Click Score: 18    End of Session Equipment Utilized During Treatment: Gait belt Activity Tolerance: Patient tolerated treatment well Patient left: with call bell/phone within reach;in chair   PT Visit Diagnosis: Other abnormalities of gait and mobility (R26.89)    Time: 1914-7829 PT Time Calculation (min) (ACUTE ONLY): 28 min   Charges:   PT Evaluation $PT Eval Low Complexity: 1 Low PT Treatments $Gait Training: 8-22 mins   PT G CodesMagda Kiel, [place] [phone] 07/03/2018   Reginia Naas 07/03/2018, 9:59 AM

## 2018-07-03 NOTE — Discharge Summary (Addendum)
Discharge Summary  Joshua Henson UUV:[id number] DOB: [birth year]  PCP: Clovia Cuff, MD  Admit date: 06/30/2018 Discharge date: 07/03/2018  Time spent: 25 minutes  Recommendations for Outpatient Follow-up:  Take your medications as prescribed Follow-up with your primary care provider  Discharge Diagnoses:  Active Hospital Problems   Diagnosis Date Noted  . Elevated troponin 06/30/2018  . Nausea & vomiting 07/01/2018    Resolved Hospital Problems  No resolved problems to display.    Discharge Condition: Stable  Diet recommendation: Heart healthy diet  Vitals:   07/03/18 1130 07/03/18 1200  BP:  117/88  Pulse:  67  Resp:  10  Temp: 97.8 F (36.6 C)   SpO2:  93%    History of present illness:  [name] a71 y.o.male,w prostate cancer, etoh dep, cirrhosis, CVA, CAD Who presents with nausea and vomitting of 1 day duration.  Troponin elevated on presentation.  Cardiology consulted.  Admitted for elevated troponin/demand ischemia versus NSTEMI and started on heparin drip.  2D echo done on 07/02/2018 revealed preserved LVEF with diastolic dysfunction.  No complication during hospital stay.  07/03/2018: Patient seen and examined at his bedside.  He denies chest pain, palpitations or dyspnea.  The day of discharge the patient was hemodynamically stable.  He will need to follow-up with his primary care provider post hospitalization.    Hospital Course:  Principal Problem:   Elevated troponin Active Problems:   Nausea & vomiting  Elevated troponin secondary to demand ischemia versus NSTEMI Troponin flat at 0.67 Twelve-lead EKG done on 06/30/2018 personally reviewed revealed sinus rhythm with no specific ST-T changes On heparin drip on admission Cardiology consulted and no plan for further cardiac testing or invasive intervention  AKI on CKD 3, resolved Creatinine on presentation 1.57 Creatinine today on 07/03/2018 1.19 which is at baseline Avoid  NSAIDs  Chronic diastolic CHF Last 2D echo done on 07/01/2018 revealed LVEF 60 to 65% and grade 1 diastolic dysfunction Strict I&O and daily weight Continue cardiac medications  Coronary artery disease Continue aspirin, Lipitor, Coreg  Alcohol dependence with concern for alcohol withdrawal Continue thiamine, folic acid and multivitamins  Hypertension Continue Coreg  BPH Continue Flomax  Chronic depression/anxiety Continue Remeron, Seroquel, and bupropion  Chronic constipation MiraLAX daily or as needed     Procedures:  None  Consultations:  Cardiology  Discharge Exam: BP 117/88 (BP Location: Right Arm)   Pulse 67   Temp 97.8 F (36.6 C) (Oral)   Resp 10   Ht 5\' 10"  (1.778 m)   Wt 93 kg (205 lb 0.4 oz)   SpO2 93%   BMI 29.42 kg/m  . General: 72 y.o. year-old male well developed well nourished in no acute distress.  Alert and oriented x3. . Cardiovascular: Regular rate and rhythm with no rubs or gallops.  No thyromegaly or JVD noted.   Marland Kitchen Respiratory: Clear to auscultation with no wheezes or rales. Good inspiratory effort. . Abdomen: Soft nontender nondistended with normal bowel sounds x4 quadrants. . Musculoskeletal: No lower extremity edema. 2/4 pulses in all 4 extremities. . Skin: No ulcerative lesions noted or rashes . Psychiatry: Mood is appropriate for condition and setting  Discharge Instructions You were cared for by a hospitalist during your hospital stay. If you have any questions about your discharge medications or the care you received while you were in the hospital after you are discharged, you can call the unit and asked to speak with the hospitalist on call if the hospitalist that took care of  you is not available. Once you are discharged, your primary care physician will handle any further medical issues. Please note that NO REFILLS for any discharge medications will be authorized once you are discharged, as it is imperative that you return  to your primary care physician (or establish a relationship with a primary care physician if you do not have one) for your aftercare needs so that they can reassess your need for medications and monitor your lab values.   Allergies as of 07/03/2018      Reactions   Lisinopril    syncope   Celecoxib Rash      Medication List    STOP taking these medications   senna-docusate 8.6-50 MG tablet Commonly known as:  Senokot-S     TAKE these medications   aspirin 81 MG EC tablet Take 1 tablet (81 mg total) by mouth daily.   atorvastatin 40 MG tablet Commonly known as:  LIPITOR Take 1 tablet (40 mg total) by mouth daily at 6 PM.   buPROPion 200 MG 12 hr tablet Commonly known as:  WELLBUTRIN SR Take 200 mg by mouth daily.   carvedilol 12.5 MG tablet Commonly known as:  COREG Take 1 tablet (12.5 mg total) by mouth 2 (two) times daily.   cholecalciferol 1000 units tablet Commonly known as:  VITAMIN D Take 1,000 Units by mouth 2 (two) times daily.   Co Q-10 200 MG Caps Take 200 mg by mouth daily.   Fish Oil 1000 MG Caps Take 1,000 mg by mouth 2 (two) times daily.   folic acid 1 MG tablet Commonly known as:  FOLVITE take 1 tablet by mouth once daily   LORazepam 1 MG tablet Commonly known as:  ATIVAN Take 1 tablet (1 mg total) by mouth 2 (two) times daily as needed for anxiety.   Magnesium 500 MG Caps Take 500 mg by mouth at bedtime.   mirtazapine 7.5 MG tablet Commonly known as:  REMERON take 1 tablet by mouth at bedtime What changed:    how much to take  how to take this  when to take this   nitroGLYCERIN 0.4 MG SL tablet Commonly known as:  NITROSTAT Place 1 tablet (0.4 mg total) under the tongue every 5 (five) minutes as needed for chest pain.   polyethylene glycol packet Commonly known as:  MIRALAX / GLYCOLAX Take 17 g by mouth daily. Start taking on:  07/04/2018   potassium chloride SA 20 MEQ tablet Commonly known as:  K-DUR,KLOR-CON take 2 tablets by  mouth once daily What changed:    how much to take  how to take this  when to take this   QUEtiapine 25 MG tablet Commonly known as:  SEROQUEL Take 1 tablet (25 mg total) by mouth daily before breakfast. What changed:  Another medication with the same name was removed. Continue taking this medication, and follow the directions you see here.   tamsulosin 0.4 MG Caps capsule Commonly known as:  FLOMAX Take 1 capsule (0.4 mg total) by mouth daily.   thiamine 100 MG tablet Commonly known as:  VITAMIN B-1 Take 100 mg by mouth daily.   triamterene-hydrochlorothiazide 37.5-25 MG capsule Commonly known as:  DYAZIDE Take 1 capsule by mouth daily.      Allergies  Allergen Reactions  . Lisinopril     syncope  . Celecoxib Rash   Follow-up Information    Clovia Cuff, MD. Call in 1 day(s).   Specialty:  Internal Medicine Why:  Please  call for an appointment. Contact information: [place] [address] [phone]        Nahser, Wonda Cheng, MD Follow up on 07/03/2018.   Specialty:  Cardiology Contact information: [place] [address] [phone]            The results of significant diagnostics from this hospitalization (including imaging, microbiology, ancillary and laboratory) are listed below for reference.    Significant Diagnostic Studies: Dg Chest 2 View  Result Date: 06/30/2018 CLINICAL DATA:  Pt from home with complaint of generalized weakness for the past 24 hours. Pt. stated that he was nauseous and has vomited several times this morning. EXAM: CHEST - 2 VIEW COMPARISON:  11/15/2017 FINDINGS: Cardiac silhouette is normal in size and configuration. No mediastinal or hilar masses or evidence of adenopathy. Clear lungs.  No pleural effusion or pneumothorax. Skeletal structures are intact. IMPRESSION: No active cardiopulmonary disease. Electronically Signed   By: Lajean Manes M.D.   On: 06/30/2018 20:54   Ct  Head Wo Contrast  Result Date: 06/30/2018 CLINICAL DATA:  72 year old male with altered mental status. EXAM: CT HEAD WITHOUT CONTRAST TECHNIQUE: Contiguous axial images were obtained from the base of the skull through the vertex without intravenous contrast. COMPARISON:  Head CT dated 10/21/2016 FINDINGS: Brain: The ventricles and sulci appropriate size for patient's age. Mild periventricular and deep white matter chronic microvascular ischemic changes noted. There is no acute intracranial hemorrhage. No mass effect or midline shift. No extra-axial fluid collection. Vascular: No hyperdense vessel or unexpected calcification. Skull: Normal. Negative for fracture or focal lesion. Sinuses/Orbits: There is partial opacification of the maxillary sinuses with remodeling of the walls of the right maxillary sinus consistent with chronic sinusitis. The mastoid air cells are clear. There is no air-fluid levels. Stable foci of calcification over the left globe similar to prior CT. Other: None IMPRESSION: 1. No acute intracranial pathology. 2. Mild age-related atrophy and chronic microvascular ischemic changes. 3. Chronic appearing bilateral maxillary sinus disease. Electronically Signed   By: Anner Crete M.D.   On: 06/30/2018 21:54    Microbiology: Recent Results (from the past 240 hour(s))  MRSA PCR Screening     Status: None   Collection Time: 06/30/18  7:13 PM  Result Value Ref Range Status   MRSA by PCR NEGATIVE NEGATIVE Final    Comment:        The GeneXpert MRSA Assay (FDA approved for NASAL specimens only), is one component of a comprehensive MRSA colonization surveillance program. It is not intended to diagnose MRSA infection nor to guide or monitor treatment for MRSA infections. Performed at [facility], [place] [address], [place], [place] [address]      Labs: Basic Metabolic Panel: Recent Labs  Lab 06/30/18 2000 07/01/18 0830 07/02/18 0043 07/03/18 0346  NA 144  142 142 140  K 5.4* 3.2* 3.8 3.3*  CL 102 107 107 108  CO2 20* 23 24 24   GLUCOSE 170* 132* 112* 107*  BUN 36* 35* 30* 23  CREATININE 1.57* 1.40* 1.27* 1.19  CALCIUM 10.4* 8.7* 8.7* 8.7*   Liver Function Tests: Recent Labs  Lab 06/30/18 2000 07/01/18 0830  AST 41 36  ALT 33 28  ALKPHOS 73 58  BILITOT 2.0* 1.5*  PROT 8.4* 6.7  ALBUMIN 4.8 3.7   Recent Labs  Lab 06/30/18 2000  LIPASE 43   Recent Labs  Lab 06/30/18 2053  AMMONIA 34   CBC: Recent Labs  Lab 06/30/18 2000 07/01/18 0830 07/02/18 0043 07/03/18 0346  WBC 11.5* 9.4  9.6 7.5 6.8  NEUTROABS 10.3*  --   --   --   HGB 16.9 14.0  14.0 14.3 14.1  HCT 46.5 39.1  38.6* 41.1 40.5  MCV 94.1 97.0  98.5 97.6 96.2  PLT 295 239  250 191 179   Cardiac Enzymes: Recent Labs  Lab 06/30/18 2000 07/01/18 0052 07/01/18 0830 07/01/18 1249  CKTOTAL  --  409*  --   --   CKMB  --  4.1  --   --   TROPONINI 0.72* 0.57* 0.65* 0.65*   BNP: BNP (last 3 results) No results for input(s): BNP in the last 8760 hours.  ProBNP (last 3 results) No results for input(s): PROBNP in the last 8760 hours.  CBG: No results for input(s): GLUCAP in the last 168 hours.     Signed:  Kayleen Memos, MD [facility] 07/03/2018, 12:56 PM

## 2018-07-03 NOTE — Discharge Instructions (Signed)
Chronic Kidney Disease, Adult °Chronic kidney disease (CKD) happens when the kidneys are damaged during a time of 3 or more months. The kidneys are two organs that do many important jobs in the body. These jobs include: °· Removing wastes and extra fluids from the blood. °· Making hormones that maintain the amount of fluid in your tissues and blood vessels. °· Making sure that the body has the right amount of fluids and chemicals. ° °Most of the time, this condition does not go away, but it can usually be controlled. Steps must be taken to slow down the kidney damage or stop it from getting worse. Otherwise, the kidneys may stop working. °Follow these instructions at home: °· Follow your diet as told by your doctor. You may need to avoid alcohol, salty foods (sodium), and foods that are high in potassium, calcium, and protein. °· Take over-the-counter and prescription medicines only as told by your doctor. Do not take any new medicines unless your doctor says you can do that. These include vitamins and minerals. °? Medicines and nutritional supplements can make kidney damage worse. °? Your doctor may need to change how much medicine you take. °· Do not use any tobacco products. These include cigarettes, chewing tobacco, and e-cigarettes. If you need help quitting, ask your doctor. °· Keep all follow-up visits as told by your doctor. This is important. °· Check your blood pressure. Tell your doctor if there are changes to your blood pressure. °· Get to a healthy weight. Stay at that weight. If you need help with this, ask your doctor. °· Start or continue an exercise plan. Try to exercise at least 30 minutes a day, 5 days a week. °· Stay up-to-date with your shots (immunizations) as told by your doctor. °Contact a doctor if: °· Your symptoms get worse. °· You have new symptoms. °Get help right away if: °· You have symptoms of end-stage kidney disease. These include: °? Headaches. °? Skin that is darker or lighter  than normal. °? Numbness in your hands or feet. °? Easy bruising. °? Having hiccups often. °? Chest pain. °? Shortness of breath. °? Stopping of menstrual periods in women. °· You have a fever. °· You are making very little pee (urine). °· You have pain or bleeding when you pee (urinate). °This information is not intended to replace advice given to you by your health care provider. Make sure you discuss any questions you have with your health care provider. °Document Released: 03/01/2010 Document Revised: 05/12/2016 Document Reviewed: 08/03/2012 °Elsevier Interactive Patient Education © 2017 [employer] ° ° °Acute Kidney Injury, Adult °Acute kidney injury is a sudden worsening of kidney function. The kidneys are organs that have several jobs. They filter the blood to remove waste products and extra fluid. They also maintain a healthy balance of minerals and hormones in the body, which helps control blood pressure and keep bones strong. With this condition, your kidneys do not do their jobs as well as they should. °This condition ranges from mild to severe. Over time it may develop into long-lasting (chronic) kidney disease. Early detection and treatment may prevent acute kidney injury from developing into a chronic condition. °What are the causes? °Common causes of this condition include: °· A problem with blood flow to the kidneys. This may be caused by: °? Low blood pressure (hypotension) or shock. °? Blood loss. °? Heart and blood vessel (cardiovascular) disease. °? Severe burns. °? Liver disease. °· Direct damage to the kidneys. This may   be caused by: °? Certain medicines. °? A kidney infection. °? Poisoning. °? Being around or in contact with toxic substances. °? A surgical wound. °? A hard, direct hit to the kidney area. °· A sudden blockage of urine flow. This may be caused by: °? Cancer. °? Kidney stones. °? An enlarged prostate in males. ° °What are the signs or symptoms? °Symptoms of this condition may  not be obvious until the condition becomes severe. Symptoms of this condition can include: °· Tiredness (lethargy), or difficulty staying awake. °· Nausea or vomiting. °· Swelling (edema) of the face, legs, ankles, or feet. °· Problems with urination, such as: °? Abdominal pain, or pain along the side of your stomach (flank). °? Decreased urine production. °? Decrease in the force of urine flow. °· Muscle twitches and cramps, especially in the legs. °· Confusion or trouble concentrating. °· Loss of appetite. °· Fever. ° °How is this diagnosed? °This condition may be diagnosed with tests, including: °· Blood tests. °· Urine tests. °· Imaging tests. °· A test in which a sample of tissue is removed from the kidneys to be examined under a microscope (kidney biopsy). ° °How is this treated? °Treatment for this condition depends on the cause and how severe the condition is. In mild cases, treatment may not be needed. The kidneys may heal on their own. In more severe cases, treatment will involve: °· Treating the cause of the kidney injury. This may involve changing any medicines you are taking or adjusting your dosage. °· Fluids. You may need specialized IV fluids to balance your body's needs. °· Having a catheter placed to drain urine and prevent blockages. °· Preventing problems from occurring. This may mean avoiding certain medicines or procedures that can cause further injury to the kidneys. ° °In some cases treatment may also require: °· A procedure to remove toxic wastes from the body (dialysis or continuous renal replacement therapy - CRRT). °· Surgery. This may be done to repair a torn kidney, or to remove the blockage from the urinary system. ° °Follow these instructions at home: °Medicines °· Take over-the-counter and prescription medicines only as told by your health care provider. °· Do not take any new medicines without your health care provider's approval. Many medicines can worsen your kidney damage. °· Do  not take any vitamin and mineral supplements without your health care provider's approval. Many nutritional supplements can worsen your kidney damage. °Lifestyle °· If your health care provider prescribed changes to your diet, follow them. You may need to decrease the amount of protein you eat. °· Achieve and maintain a healthy weight. If you need help with this, ask your health care provider. °· Start or continue an exercise plan. Try to exercise at least 30 minutes a day, 5 days a week. °· Do not use any tobacco products, such as cigarettes, chewing tobacco, and e-cigarettes. If you need help quitting, ask your health care provider. °General instructions °· Keep track of your blood pressure. Report changes in your blood pressure as told by your health care provider. °· Stay up to date with immunizations. Ask your health care provider which immunizations you need. °· Keep all follow-up visits as told by your health care provider. This is important. °Where to find more information: °· American Association of Kidney Patients: www.aakp.org °· National Kidney Foundation: www.kidney.org °· American Kidney Fund: www.akfinc.org °· [employer] [facility]: °? www.lifeoptions.org °? www.kidneyschool.org °Contact a health care provider if: °· Your symptoms get worse. °·   You develop new symptoms. °Get help right away if: °· You develop symptoms of worsening kidney disease, which include: °? Headaches. °? Abnormally dark or light skin. °? Easy bruising. °? Frequent hiccups. °? Chest pain. °? Shortness of breath. °? End of menstruation in women. °? Seizures. °? Confusion or altered mental status. °? Abdominal or back pain. °? Itchiness. °· You have a fever. °· Your body is producing less urine. °· You have pain or bleeding when you urinate. °Summary °· Acute kidney injury is a sudden worsening of kidney function. °· Acute kidney injury can be caused by problems with blood flow to the kidneys, direct damage to the  kidneys, and sudden blockage of urine flow. °· Symptoms of this condition may not be obvious until it becomes severe. Symptoms may include edema, lethargy, confusion, nausea or vomiting, and problems passing urine. °· This condition can usually be diagnosed with blood tests, urine tests, and imaging tests. Sometimes a kidney biopsy is done to diagnose this condition. °· Treatment for this condition often involves treating the underlying cause. It is treated with fluids, medicines, dialysis, diet changes, or surgery. °This information is not intended to replace advice given to you by your health care provider. Make sure you discuss any questions you have with your health care provider. °Document Released: 06/20/2011 Document Revised: 04/06/2017 Document Reviewed: 11/25/2016 °Elsevier Interactive Patient Education © 2018 Elsevier Inc. ° °

## 2018-07-03 NOTE — Progress Notes (Signed)
PHARMACIST - PHYSICIAN COMMUNICATION  DR:   Nevada Crane  CONCERNING: IV to Oral Route Change Policy  RECOMMENDATION: This patient is receiving thiamine & folic acid by the intravenous route.  Based on criteria approved by the Pharmacy and Therapeutics Committee, the intravenous medication(s) is/are being converted to the equivalent oral dose form(s).   DESCRIPTION: These criteria include:  The patient is eating (either orally or via tube) and/or has been taking other orally administered medications for a least 24 hours  The patient has no evidence of active gastrointestinal bleeding or impaired GI absorption (gastrectomy, short bowel, patient on TNA or NPO).  If you have questions about this conversion, please contact the Pharmacy Department  []   [phone] )  [facility] []   [phone] )  [facility] []   [phone] )  [facility] []   [phone] )  [facility] [x]   [phone] )  [place], PharmD, [place] Pager: [phone] 07/03/2018 9:07 AM

## 2018-07-03 NOTE — Progress Notes (Addendum)
Progress Note  Patient Name: Joshua Henson Date of Encounter: 07/03/2018  Primary Cardiologist: Mertie Moores, MD   Subjective   No pain, sitting up eating BK, feels good, he tells me he has walked the unit twice last night without pain - he would like to go home.    Inpatient Medications    Scheduled Meds: . aspirin EC  81 mg Oral Daily  . atorvastatin  40 mg Oral q1800  . buPROPion  200 mg Oral Daily  . carvedilol  12.5 mg Oral BID  . cholecalciferol  1,000 Units Oral BID  . [START ON 05/25/3709] folic acid  1 mg Oral Daily  . magnesium oxide  400 mg Oral QHS  . mouth rinse  15 mL Mouth Rinse BID  . mirtazapine  7.5 mg Oral QHS  . nitroGLYCERIN  0.5 inch Topical Q8H  . omega-3 acid ethyl esters  1 g Oral Daily  . polyethylene glycol  17 g Oral Daily  . potassium chloride SA  40 mEq Oral Daily  . QUEtiapine  25 mg Oral QAC breakfast  . senna-docusate  1 tablet Oral BID  . tamsulosin  0.4 mg Oral Daily  . [START ON 07/04/2018] thiamine  100 mg Oral Daily  . triamterene-hydrochlorothiazide  1 tablet Oral Daily   Continuous Infusions:  PRN Meds: acetaminophen **OR** acetaminophen, LORazepam, ondansetron (ZOFRAN) IV   Vital Signs    Vitals:   07/03/18 0400 07/03/18 0725 07/03/18 0800 07/03/18 0903  BP: (!) 125/93  (!) 136/100 (!) 135/105  Pulse: 71  76 85  Resp: 16  13   Temp: 98.7 F (37.1 C) 98.3 F (36.8 C)    TempSrc: Oral Oral    SpO2: 94%  96%   Weight:      Height:        Intake/Output Summary (Last 24 hours) at 07/03/2018 0928 Last data filed at 07/03/2018 0725 Gross per 24 hour  Intake 632.13 ml  Output -  Net 632.13 ml   Filed Weights   06/30/18 2300 07/01/18 0100  Weight: 210 lb (95.3 kg) 205 lb 0.4 oz (93 kg)    Telemetry    SR - Personally Reviewed  ECG    No new - Personally Reviewed  Physical Exam   GEN: No acute distress.   Neck: No JVD Cardiac: RRR, no murmurs, rubs, or gallops.  Respiratory: Clear to auscultation  bilaterally. GI: Soft, nontender, non-distended  MS: No edema; No deformity. Neuro:  Nonfocal  Psych: Normal affect   Labs    Chemistry Recent Labs  Lab 06/30/18 2000 07/01/18 0830 07/02/18 0043 07/03/18 0346  NA 144 142 142 140  K 5.4* 3.2* 3.8 3.3*  CL 102 107 107 108  CO2 20* 23 24 24   GLUCOSE 170* 132* 112* 107*  BUN 36* 35* 30* 23  CREATININE 1.57* 1.40* 1.27* 1.19  CALCIUM 10.4* 8.7* 8.7* 8.7*  PROT 8.4* 6.7  --   --   ALBUMIN 4.8 3.7  --   --   AST 41 36  --   --   ALT 33 28  --   --   ALKPHOS 73 58  --   --   BILITOT 2.0* 1.5*  --   --   GFRNONAA 43* 49* 56* >60  GFRAA 50* 57* >60 >60  ANIONGAP 22* 12 11 8      Hematology Recent Labs  Lab 07/01/18 0830 07/02/18 0043 07/03/18 0346  WBC 9.4  9.6 7.5 6.8  RBC 4.03*  3.92* 4.21* 4.21*  HGB 14.0  14.0 14.3 14.1  HCT 39.1  38.6* 41.1 40.5  MCV 97.0  98.5 97.6 96.2  MCH 34.7*  35.7* 34.0 33.5  MCHC 35.8  36.3* 34.8 34.8  RDW 15.4  15.6* 15.5 14.9  PLT 239  250 191 179    Cardiac Enzymes Recent Labs  Lab 06/30/18 2000 07/01/18 0052 07/01/18 0830 07/01/18 1249  TROPONINI 0.72* 0.57* 0.65* 0.65*   No results for input(s): TROPIPOC in the last 168 hours.   BNPNo results for input(s): BNP, PROBNP in the last 168 hours.   DDimer No results for input(s): DDIMER in the last 168 hours.   Radiology    No results found.  Cardiac Studies   Echo 07/01/18 Study Conclusions  - Left ventricle: The cavity size was normal. Wall thickness was   normal. Systolic function was normal. The estimated ejection   fraction was in the range of 60% to 65%. Wall motion was normal;   there were no regional wall motion abnormalities. Doppler   parameters are consistent with abnormal left ventricular   relaxation (grade 1 diastolic dysfunction). - Aortic valve: Valve area (VTI): 3.32 cm^2. Valve area (Vmax):   3.15 cm^2. Valve area (Vmean): 3.17 cm^2.   Patient Profile     72 y.o. male with a hx of  resting tachycardia, stroke, CKD, HTN, hld, alcoholism,  Prostate cancer, cirrhosis and 3 vessel CAD per CT angio of chest 2017 and cardiac cath with moderately severe stenosis in mid, non dominant RCA and mild non obstructive disease in the LAD and LCX.       Assessment & Plan    1   Minimal troponin elevation   In setting of severe N/V    I am not convinced of active ischemia   Walked without problem   WOuld follow up as outpt 2.    CAD - non obstructive on cath 2017.  Is on ASA  3.    HLD on statin and LDL is controlled  4.    HTN  Labile   Diastolic  is a little high  Follow up as outpt    5.    CKD stable with GFR at 56   6   EtOH   Needs to quit drinknig   [facility] will sign off.  If Dr. Lurline Idol will see today.  ? Follow up   MeFor questions or updates, please contact [place] Please consult [email or web address] for contact info under Cardiology/STEMI.      Signed, Cecilie Kicks, NP  07/03/2018, 9:28 AM    Pt seen and examined I have amended noted above by L INgold   Pt is  Comfortable   No CP Lungs CTA   Cardiac RRR   No S3   Ext without edema  OK to D/C from cardiac standpoint    No evid for active coronary ischemia WIll make sure he has outpt f/u.  Dorris Carnes

## 2018-07-03 NOTE — Progress Notes (Signed)
ANTICOAGULATION CONSULT NOTE - Follow-Up Consult  Pharmacy Consult for IV heparin Indication: chest pain/ACS  Allergies  Allergen Reactions  . Lisinopril     syncope  . Celecoxib Rash    Patient Measurements: Height: 5\' 10"  (177.8 cm) Weight: 205 lb 0.4 oz (93 kg) IBW/kg (Calculated) : 73 Heparin Dosing Weight: 92 kg  Vital Signs: Temp: 98.7 F (37.1 C) (07/16 0400) Temp Source: Oral (07/16 0400) BP: 125/93 (07/16 0400) Pulse Rate: 71 (07/16 0400)  Labs: Recent Labs    06/30/18 2302 07/01/18 0052 07/01/18 0830 07/01/18 1249  07/02/18 0043 07/02/18 1111 07/03/18 0346 07/03/18 0347  HGB  --   --  14.0  14.0  --   --  14.3  --  14.1  --   HCT  --   --  39.1  38.6*  --   --  41.1  --  40.5  --   PLT  --   --  239  250  --   --  191  --  179  --   APTT 29  --   --   --   --   --   --   --   --   LABPROT 13.9  --   --   --   --   --   --   --   --   INR 1.08  --   --   --   --   --   --   --   --   HEPARINUNFRC  --   --  0.35  --    < > 0.37 0.61  --  0.48  CREATININE  --   --  1.40*  --   --  1.27*  --  1.19  --   CKTOTAL  --  409*  --   --   --   --   --   --   --   CKMB  --  4.1  --   --   --   --   --   --   --   TROPONINI  --  0.57* 0.65* 0.65*  --   --   --   --   --    < > = values in this interval not displayed.    Estimated Creatinine Clearance: 66.2 mL/min (by C-G formula based on SCr of 1.19 mg/dL).   Medical History: Past Medical History:  Diagnosis Date  . Acute hepatic encephalopathy   . Alcoholic cirrhosis of liver without ascites ([place])   . Anxiety state   . CAD (coronary artery disease), native coronary artery 01/10/2016   3 vessel coronary calcification noted on CT scan 04/03/15 /  cardiac cath 2017 with 60-70% stenosis in non dominant RCA and nonobstructive disease in LAD   . Depression   . Fatty liver, alcoholic 6/57/8469  . Hypertension   . Increased ammonia level   . Insomnia   . NSTEMI (non-ST elevated myocardial infarction) ([place])    . Prostate cancer ([place]) 2010   External beam radiation (urol - Risa Grill, XRT Valere Dross)  . Stroke ([place])   . Tachycardia     Medications:  Scheduled:  . aspirin EC  81 mg Oral Daily  . atorvastatin  40 mg Oral q1800  . buPROPion  200 mg Oral Daily  . carvedilol  12.5 mg Oral BID  . cholecalciferol  1,000 Units Oral BID  . folic acid  1 mg Intravenous Daily  .  magnesium oxide  400 mg Oral QHS  . mouth rinse  15 mL Mouth Rinse BID  . mirtazapine  7.5 mg Oral QHS  . nitroGLYCERIN  0.5 inch Topical Q8H  . omega-3 acid ethyl esters  1 g Oral Daily  . potassium chloride SA  40 mEq Oral Daily  . QUEtiapine  25 mg Oral QAC breakfast  . tamsulosin  0.4 mg Oral Daily  . thiamine injection  100 mg Intravenous Daily  . triamterene-hydrochlorothiazide  1 tablet Oral Daily   Infusions:  . heparin 1,150 Units/hr (07/02/18 1900)    Assessment: 3 yoM c/o generalized weakness x 24 hours. Troponin 0.72 elevated on admission with a history of CAD. Pharmacy consulted to dose/monitor heparin drip for ACS.  Patient was not on anticoagulant prior to admission. Baseline labs obtained.  Today, 07/03/2018  Heparin level therapeutic (0.48) on heparin infusion of 1150 units/hr  CBC: Hgb 14.1 (stable), Plts WNL  No issues with infusion or bleeding documented  Goal of Therapy:  Heparin level 0.3-0.7 units/ml Monitor platelets by anticoagulation protocol: Yes   Plan:   Continue heparin at 1150 units/hr   CBC and HL daily  Monitor for any signs/symptoms of bleeding  Per Cardiology, no plans for cardiac testing, follow up duration of heparin therapy  Peggyann Juba, PharmD, BCPS Pager: [phone] 07/03/2018 7:29 AM

## 2018-07-03 NOTE — Plan of Care (Signed)
Education completed, pt status is adequate for discharge

## 2018-07-14 DIAGNOSIS — G4700 Insomnia, unspecified: Secondary | ICD-10-CM | POA: Diagnosis not present

## 2018-07-14 DIAGNOSIS — I10 Essential (primary) hypertension: Secondary | ICD-10-CM | POA: Diagnosis not present

## 2018-07-14 DIAGNOSIS — F341 Dysthymic disorder: Secondary | ICD-10-CM | POA: Diagnosis not present

## 2018-07-14 DIAGNOSIS — R339 Retention of urine, unspecified: Secondary | ICD-10-CM | POA: Diagnosis not present

## 2018-07-26 ENCOUNTER — Encounter: Payer: Self-pay | Admitting: *Deleted

## 2018-07-26 ENCOUNTER — Ambulatory Visit: Payer: Self-pay | Admitting: Cardiology

## 2018-07-26 DIAGNOSIS — R0989 Other specified symptoms and signs involving the circulatory and respiratory systems: Secondary | ICD-10-CM

## 2018-07-26 NOTE — Progress Notes (Deleted)
Cardiology Office Note:    Date:  07/26/2018   ID:  Joshua Henson, DOB 11-18-1946, MRN [id number]  PCP:  Clovia Cuff, MD  Cardiologist:  Mertie Moores, MD  Referring MD: Clovia Cuff, MD   No chief complaint on file. ***  History of Present Illness:    Joshua Henson is a 72 y.o. Joshua with a past medical history significant for resting tachycardia, stroke, CKD, hypertension, hyperlipidemia, alcoholism, prostate cancer, cirrhosis and three-vessel CAD per CTA 2017 and cardiac cath with moderately severe stenosis in the mid, nondominant RCA with mild nonobstructive disease in the LAD and left circumflex.   Was admitted to the hospital 06/30/2018 with fatigue and nausea and vomiting.  He denied chest pain opponents were mildly elevated, no acute EKG changes.  Echocardiogram was stable.  Considering no chest pain and no echo changes is elevated troponins felt to be related to demand ischemia in the setting of acute illness and CAD.  Did have some acute kidney injury with creatinine up to 1.57 but this came down to 1.19 at discharge.  He is advised to avoid NSAIDs.   --------------------------------  CAD: Nonobstructive on cath in 2017.  On aspirin 81 mg statin.  Elevated troponins on recent admission felt to be demand ischemia. 10 used to have no chest pain.  Hypertension  Hyperlipidemia    Tobacco  Alcohol  Past Medical History:  Diagnosis Date  . Acute hepatic encephalopathy   . Alcoholic cirrhosis of liver without ascites ([place])   . Anxiety state   . CAD (coronary artery disease), native coronary artery 01/10/2016   3 vessel coronary calcification noted on CT scan 04/03/15 /  cardiac cath 2017 with 60-70% stenosis in non dominant RCA and nonobstructive disease in LAD   . Depression   . Fatty liver, alcoholic 06/14/349  . Hypertension   . Increased ammonia level   . Insomnia   . NSTEMI (non-ST elevated myocardial infarction) ([place])   . Prostate cancer ([place]) 2010   External  beam radiation (urol - Risa Grill, XRT Valere Dross)  . Stroke ([place])   . Tachycardia     Past Surgical History:  Procedure Laterality Date  . CARDIAC CATHETERIZATION N/A 01/15/2016   Procedure: Left Heart Cath and Coronary Angiography;  Surgeon: Burnell Blanks, MD;  Location: [facility];  Service: Cardiovascular;  Laterality: N/A;    Current Medications: No outpatient medications have been marked as taking for the 07/26/18 encounter (Appointment) with Daune Perch, NP.     Allergies:   Lisinopril and Celecoxib   Social History   Socioeconomic History  . Marital status: Divorced    Spouse name: Not on file  . Number of children: 3  . Years of education: 9  . Highest education level: Not on file  Occupational History  . Occupation: Chief Executive Officer  Social Needs  . Financial resource strain: Not on file  . Food insecurity:    Worry: Not on file    Inability: Not on file  . Transportation needs:    Medical: Not on file    Non-medical: Not on file  Tobacco Use  . Smoking status: Current Every Day Smoker    Packs/day: 0.50    Years: 30.00    Pack years: 15.00    Types: Cigarettes  . Smokeless tobacco: Never Used  Substance and Sexual Activity  . Alcohol use: Yes    Alcohol/week: 56.0 standard drinks    Types: 56 Shots of liquor per week  Comment: last drink yesterday  . Drug use: No    Comment: Cocaine intermittently  . Sexual activity: Yes  Lifestyle  . Physical activity:    Days per week: Not on file    Minutes per session: Not on file  . Stress: Not on file  Relationships  . Social connections:    Talks on phone: Not on file    Gets together: Not on file    Attends religious service: Not on file    Active member of club or organization: Not on file    Attends meetings of clubs or organizations: Not on file    Relationship status: Not on file  Other Topics Concern  . Not on file  Social History Narrative   HSG, [employer], [place].  Married 22 yrs yrs - divorced. Twin boys - 4; 1 dtr - '94. Pension scheme manager.      Family History: The patient's ***family history includes Cancer in his father, mother, and paternal grandfather; Diabetes in his mother; Heart disease in his maternal grandfather; Hypertension in his mother. ROS:   Please see the history of present illness.    *** All other systems reviewed and are negative.  EKGs/Labs/Other Studies Reviewed:    The following studies were reviewed today:  Cardiac cath 01/15/16 1. Moderately severe stenosis in the mid segment of the non-dominant RCA.  2. Mild non-obstructive disease in the LAD and Circumflex  Recommendations: I have had a long discussion today with his girlfriend who is his HCPOA. She reports that he has had no exertional chest pain at home. He was admitted due to alcohol intoxication and withdrawal. He has had a complicated hospital course due to delirium tremens with respiratory failure/aspiration pneumonia. Troponin was elevated, likely due to demand ischemia. While he does have a 60-70% stenosis in the small to moderate caliber mid RCA, I do not think this is clinically significant. The RCA is a non-dominant vessel. I would recommend medical therapy for CAD at this time. He would seem to be a poor candidate for a coronary stent given his alcohol abuse and possible abuse of other substances (benzo/cocaine). I would restart his statin and continue ASA along with a beta blocker.    Echo 07/01/18 Study Conclusions - Left ventricle: The cavity size was normal. Wall thickness was normal. Systolic function was normal. The estimated ejection fraction was in the range of 60% to 65%. Wall motion was normal; there were no regional wall motion abnormalities. Doppler parameters are consistent with abnormal left ventricular relaxation (grade 1 diastolic dysfunction). - Aortic valve: Valve area (VTI): 3.32 cm^2. Valve area (Vmax): 3.15 cm^2. Valve area  (Vmean): 3.17 cm^2.    EKG:  EKG is *** ordered today.  The ekg ordered today demonstrates ***  Recent Labs: 11/21/2017: Magnesium 2.0 07/01/2018: ALT 28 07/03/2018: BUN 23; Creatinine, Ser 1.19; Hemoglobin 14.1; Platelets 179; Potassium 3.3; Sodium 140   Recent Lipid Panel    Component Value Date/Time   CHOL 145 07/01/2018 0830   TRIG 79 07/01/2018 0830   HDL 66 07/01/2018 0830   CHOLHDL 2.2 07/01/2018 0830   VLDL 16 07/01/2018 0830   LDLCALC 63 07/01/2018 0830    Physical Exam:    VS:  There were no vitals taken for this visit.    Wt Readings from Last 3 Encounters:  07/01/18 205 lb 0.4 oz (93 kg)  11/19/17 193 lb 9 oz (87.8 kg)  02/15/17 190 lb 14.7 oz (86.6 kg)  Physical Exam***   ASSESSMENT:    No diagnosis found. PLAN:    In order of problems listed above:  1. ***   Medication Adjustments/Labs and Tests Ordered: Current medicines are reviewed at length with the patient today.  Concerns regarding medicines are outlined above. Labs and tests ordered and medication changes are outlined in the patient instructions below:  There are no Patient Instructions on file for this visit.   Signed, Daune Perch, NP  07/26/2018 6:05 AM    [employer]

## 2018-08-01 ENCOUNTER — Encounter: Payer: Self-pay | Admitting: Cardiology

## 2018-08-02 ENCOUNTER — Emergency Department (HOSPITAL_COMMUNITY): Payer: Medicare Other

## 2018-08-02 ENCOUNTER — Encounter (HOSPITAL_COMMUNITY): Payer: Self-pay | Admitting: *Deleted

## 2018-08-02 ENCOUNTER — Other Ambulatory Visit: Payer: Self-pay

## 2018-08-02 ENCOUNTER — Observation Stay (HOSPITAL_COMMUNITY)
Admission: EM | Admit: 2018-08-02 | Discharge: 2018-08-04 | Disposition: A | Discharge status: Home / Self Care | Payer: Medicare Other | Attending: Internal Medicine | Admitting: Internal Medicine

## 2018-08-02 DIAGNOSIS — Z79899 Other long term (current) drug therapy: Secondary | ICD-10-CM | POA: Diagnosis not present

## 2018-08-02 DIAGNOSIS — G4700 Insomnia, unspecified: Secondary | ICD-10-CM | POA: Diagnosis not present

## 2018-08-02 DIAGNOSIS — I252 Old myocardial infarction: Secondary | ICD-10-CM | POA: Insufficient documentation

## 2018-08-02 DIAGNOSIS — Z7982 Long term (current) use of aspirin: Secondary | ICD-10-CM | POA: Diagnosis not present

## 2018-08-02 DIAGNOSIS — I10 Essential (primary) hypertension: Secondary | ICD-10-CM | POA: Diagnosis present

## 2018-08-02 DIAGNOSIS — I2510 Atherosclerotic heart disease of native coronary artery without angina pectoris: Secondary | ICD-10-CM | POA: Diagnosis not present

## 2018-08-02 DIAGNOSIS — F17210 Nicotine dependence, cigarettes, uncomplicated: Secondary | ICD-10-CM | POA: Diagnosis not present

## 2018-08-02 DIAGNOSIS — I130 Hypertensive heart and chronic kidney disease with heart failure and stage 1 through stage 4 chronic kidney disease, or unspecified chronic kidney disease: Secondary | ICD-10-CM | POA: Diagnosis not present

## 2018-08-02 DIAGNOSIS — N183 Chronic kidney disease, stage 3 (moderate): Secondary | ICD-10-CM | POA: Insufficient documentation

## 2018-08-02 DIAGNOSIS — N400 Enlarged prostate without lower urinary tract symptoms: Secondary | ICD-10-CM | POA: Insufficient documentation

## 2018-08-02 DIAGNOSIS — Z888 Allergy status to other drugs, medicaments and biological substances status: Secondary | ICD-10-CM | POA: Insufficient documentation

## 2018-08-02 DIAGNOSIS — E876 Hypokalemia: Secondary | ICD-10-CM | POA: Insufficient documentation

## 2018-08-02 DIAGNOSIS — Z8673 Personal history of transient ischemic attack (TIA), and cerebral infarction without residual deficits: Secondary | ICD-10-CM | POA: Diagnosis not present

## 2018-08-02 DIAGNOSIS — F10229 Alcohol dependence with intoxication, unspecified: Principal | ICD-10-CM | POA: Insufficient documentation

## 2018-08-02 DIAGNOSIS — K219 Gastro-esophageal reflux disease without esophagitis: Secondary | ICD-10-CM | POA: Diagnosis not present

## 2018-08-02 DIAGNOSIS — J690 Pneumonitis due to inhalation of food and vomit: Secondary | ICD-10-CM

## 2018-08-02 DIAGNOSIS — F329 Major depressive disorder, single episode, unspecified: Secondary | ICD-10-CM | POA: Insufficient documentation

## 2018-08-02 DIAGNOSIS — Z8546 Personal history of malignant neoplasm of prostate: Secondary | ICD-10-CM | POA: Insufficient documentation

## 2018-08-02 DIAGNOSIS — Y904 Blood alcohol level of 80-99 mg/100 ml: Secondary | ICD-10-CM | POA: Diagnosis not present

## 2018-08-02 DIAGNOSIS — R110 Nausea: Secondary | ICD-10-CM | POA: Diagnosis present

## 2018-08-02 DIAGNOSIS — E785 Hyperlipidemia, unspecified: Secondary | ICD-10-CM | POA: Insufficient documentation

## 2018-08-02 DIAGNOSIS — I700 Atherosclerosis of aorta: Secondary | ICD-10-CM | POA: Diagnosis not present

## 2018-08-02 DIAGNOSIS — F10939 Alcohol use, unspecified with withdrawal, unspecified: Secondary | ICD-10-CM

## 2018-08-02 DIAGNOSIS — R109 Unspecified abdominal pain: Secondary | ICD-10-CM

## 2018-08-02 DIAGNOSIS — F10239 Alcohol dependence with withdrawal, unspecified: Secondary | ICD-10-CM | POA: Diagnosis present

## 2018-08-02 DIAGNOSIS — F419 Anxiety disorder, unspecified: Secondary | ICD-10-CM | POA: Insufficient documentation

## 2018-08-02 DIAGNOSIS — E872 Acidosis: Secondary | ICD-10-CM | POA: Insufficient documentation

## 2018-08-02 DIAGNOSIS — K760 Fatty (change of) liver, not elsewhere classified: Secondary | ICD-10-CM | POA: Insufficient documentation

## 2018-08-02 DIAGNOSIS — R112 Nausea with vomiting, unspecified: Secondary | ICD-10-CM | POA: Diagnosis present

## 2018-08-02 DIAGNOSIS — I5032 Chronic diastolic (congestive) heart failure: Secondary | ICD-10-CM | POA: Diagnosis not present

## 2018-08-02 HISTORY — DX: Alcohol abuse, uncomplicated: F10.10

## 2018-08-02 HISTORY — DX: Other ill-defined heart diseases: I51.89

## 2018-08-02 HISTORY — DX: Tachycardia, unspecified: R00.0

## 2018-08-02 LAB — CBC
HEMATOCRIT: 40.9 % (ref 39.0–52.0)
Hemoglobin: 15.4 g/dL (ref 13.0–17.0)
MCH: 37.3 pg — AB (ref 26.0–34.0)
MCHC: 37.7 g/dL — AB (ref 30.0–36.0)
MCV: 99 fL (ref 78.0–100.0)
PLATELETS: 259 10*3/uL (ref 150–400)
RBC: 4.13 MIL/uL — ABNORMAL LOW (ref 4.22–5.81)
RDW: 14.8 % (ref 11.5–15.5)
WBC: 6.3 10*3/uL (ref 4.0–10.5)

## 2018-08-02 LAB — COMPREHENSIVE METABOLIC PANEL
ALBUMIN: 4.1 g/dL (ref 3.5–5.0)
ALT: 35 U/L (ref 0–44)
AST: 47 U/L — AB (ref 15–41)
Alkaline Phosphatase: 67 U/L (ref 38–126)
Anion gap: 18 — ABNORMAL HIGH (ref 5–15)
BILIRUBIN TOTAL: 1 mg/dL (ref 0.3–1.2)
BUN: 21 mg/dL (ref 8–23)
CHLORIDE: 105 mmol/L (ref 98–111)
CO2: 19 mmol/L — ABNORMAL LOW (ref 22–32)
CREATININE: 1.19 mg/dL (ref 0.61–1.24)
Calcium: 9.5 mg/dL (ref 8.9–10.3)
GFR calc Af Amer: >60 mL/min (ref >60)
GLUCOSE: 78 mg/dL (ref 70–99)
Potassium: 4.1 mmol/L (ref 3.5–5.1)
Sodium: 142 mmol/L (ref 135–145)
Total Protein: 7.5 g/dL (ref 6.5–8.1)

## 2018-08-02 LAB — LIPASE, BLOOD: LIPASE: 28 U/L (ref 11–51)

## 2018-08-02 LAB — ETHANOL: Alcohol, Ethyl (B): 80 mg/dL — ABNORMAL HIGH (ref <10)

## 2018-08-02 MED ORDER — ONDANSETRON HCL 4 MG PO TABS: 4.0000 mg | ORAL_TABLET | Freq: Four times a day (QID) | ORAL | 0 refills | Status: DC

## 2018-08-02 MED ORDER — SODIUM CHLORIDE 0.9 % IV BOLUS
1000.0000 mL | Freq: Once | INTRAVENOUS | Status: AC
  Administered 2018-08-02: 1000 mL via INTRAVENOUS

## 2018-08-02 MED ORDER — LORAZEPAM 2 MG/ML IJ SOLN
1.0000 mg | Freq: Once | INTRAMUSCULAR | Status: AC
  Administered 2018-08-02: 1 mg via INTRAVENOUS
  Filled 2018-08-02: qty 1

## 2018-08-02 MED ORDER — IOPAMIDOL (ISOVUE-300) INJECTION 61%
100.0000 mL | Freq: Once | INTRAVENOUS | Status: AC | PRN
  Administered 2018-08-02: 100 mL via INTRAVENOUS

## 2018-08-02 MED ORDER — IOPAMIDOL (ISOVUE-300) INJECTION 61%
INTRAVENOUS | Status: AC
  Filled 2018-08-02: qty 100

## 2018-08-02 MED ORDER — ONDANSETRON HCL 4 MG/2ML IJ SOLN
4.0000 mg | Freq: Once | INTRAMUSCULAR | Status: AC
  Administered 2018-08-02: 4 mg via INTRAVENOUS

## 2018-08-02 MED ORDER — PANTOPRAZOLE SODIUM 20 MG PO TBEC: 20.0000 mg | DELAYED_RELEASE_TABLET | Freq: Every day | ORAL | 0 refills | Status: DC

## 2018-08-02 MED ORDER — ONDANSETRON HCL 4 MG/2ML IJ SOLN
4.0000 mg | Freq: Once | INTRAMUSCULAR | Status: DC
  Filled 2018-08-02 (×2): qty 2

## 2018-08-02 NOTE — ED Triage Notes (Signed)
Pt reports R side pain x 2 days without n/v.  He is asking if this nurse can give him pain medicine d/t worsening pain.  While asking pt triage questions, he became irritated and states "call my wife and tell her to come get me."  Explained triage process to pt and made him aware that a triage assessment needs to be done before any interventions are intiated.  He is on the phone with his wife states "they left me sitting here in the goddamn waiting room."

## 2018-08-02 NOTE — ED Triage Notes (Signed)
Per EMS- Pt. C/o RLQ pain since 0200 today. Patient denies any N/V/D. No problems urinating. Patient has been drinking alcohol today. EKG-ST.

## 2018-08-02 NOTE — ED Provider Notes (Signed)
[facility] DEPT Provider Note   CSN: [id number] Arrival date & time: 08/02/18  1636     History   Chief Complaint Chief Complaint  Patient presents with  . Abdominal Pain    HPI Joshua Henson is a 72 y.o. male.  The history is provided by the patient.  Abdominal Pain   This is a new problem. The current episode started 6 to 12 hours ago. The problem occurs constantly. The problem has been gradually improving. The pain is associated with alcohol use. The pain is located in the generalized abdominal region. The quality of the pain is dull. The pain is at a severity of 4/10. The pain is mild. Pertinent negatives include anorexia, fever, belching, diarrhea, flatus, hematochezia, melena, nausea, vomiting, constipation, dysuria, frequency, hematuria, headaches, arthralgias and myalgias. Nothing aggravates the symptoms. Nothing relieves the symptoms. Past workup does not include GI consult. His past medical history does not include PUD, gallstones or Crohn's disease.    Past Medical History:  Diagnosis Date  . Acute hepatic encephalopathy   . Alcoholic cirrhosis of liver without ascites ([place])   . Anxiety state   . CAD (coronary artery disease), native coronary artery 01/10/2016   3 vessel coronary calcification noted on CT scan 04/03/15 /  cardiac cath 2017 with 60-70% stenosis in non dominant RCA and nonobstructive disease in LAD   . Depression   . Fatty liver, alcoholic 03/27/7352  . Hypertension   . Increased ammonia level   . Insomnia   . NSTEMI (non-ST elevated myocardial infarction) ([place])   . Prostate cancer ([place]) 2010   External beam radiation (urol - Risa Grill, XRT Valere Dross)  . Stroke ([place])   . Tachycardia     Patient Active Problem List   Diagnosis Date Noted  . Alcohol dependence with acute alcoholic intoxication, with unspecified complication ([place]) 29/92/4268  . Nausea & vomiting 07/01/2018  . Elevated troponin 06/30/2018  . Altered mental  status 11/20/2017  . LFT elevation   . Alcoholic ketosis   . Dehydration   . Alcohol withdrawal ([place]) 11/09/2017  . Alcohol-induced acute pancreatitis   . Hematemesis 02/15/2017  . Acute pancreatitis 02/15/2017  . Alcohol dependence with unspecified alcohol-induced disorder ([place])   . Alcohol withdrawal delirium ([place]) 09/13/2016  . C. difficile colitis 03/30/2016  . Essential hypertension 03/23/2016  . Fall   . Tobacco abuse   . Hypokalemia   . CAD (coronary artery disease)   . Cocaine abuse ([place])   . Frequent falls 01/11/2016  . Left rib fracture 01/11/2016  . Acute encephalopathy 01/11/2016  . Polysubstance abuse ([place]) 01/11/2016  . Delirium tremens ([place]) 01/11/2016  . NSTEMI (non-ST elevated myocardial infarction) ([place]) 01/11/2016  . Sepsis due to Enterococcus with acute renal failure and metabolic encephalopathy ([place]) 01/11/2016  . Chronic alcohol abuse   . CAD (coronary artery disease), native coronary artery 01/10/2016  . Fatty liver, alcoholic 34/19/6222  . Hypomagnesemia 04/03/2015  . Ataxia   . Acute respiratory failure ([place]) 09/05/2014  . Alcohol withdrawal, with delirium ([place]) 09/03/2014  . Unspecified cerebral artery occlusion with cerebral infarction 07/05/2014  . Alcohol abuse 07/02/2014  . Current tobacco use 01/17/2014  . Hearing loss 11/25/2013  . Tinnitus of both ears 11/25/2013    Past Surgical History:  Procedure Laterality Date  . CARDIAC CATHETERIZATION N/A 01/15/2016   Procedure: Left Heart Cath and Coronary Angiography;  Surgeon: Burnell Blanks, MD;  Location: [facility];  Service: Cardiovascular;  Laterality:  N/A;        Home Medications    Prior to Admission medications   Medication Sig Start Date End Date Taking? Authorizing Provider  aspirin 81 MG EC tablet Take 1 tablet (81 mg total) by mouth daily. 07/11/14  Yes Angiulli, Lavon Paganini, PA-C  atorvastatin (LIPITOR) 40 MG tablet Take 1 tablet (40 mg total) by mouth daily at 6  PM. 01/19/16  Yes Allie Bossier, MD  buPROPion [facility] SR) 200 MG 12 hr tablet Take 200 mg by mouth daily.   Yes [provider]  carvedilol (COREG) 12.5 MG tablet Take 1 tablet (12.5 mg total) by mouth 2 (two) times daily. 02/23/17  Yes Florencia Reasons, MD  cholecalciferol (VITAMIN D) 1000 units tablet Take 1,000 Units by mouth 2 (two) times daily.    Yes [provider]  Coenzyme Q10 (CO Q-10) 200 MG CAPS Take 200 mg by mouth daily.   Yes [provider]  folic acid (FOLVITE) 1 MG tablet take 1 tablet by mouth once daily 01/12/17  Yes Medina-Vargas, Monina C, NP  LORazepam (ATIVAN) 1 MG tablet Take 1 tablet (1 mg total) by mouth 2 (two) times daily as needed for anxiety. 02/23/17  Yes Florencia Reasons, MD  Magnesium 500 MG CAPS Take 500 mg by mouth at bedtime.   Yes [provider]  mirtazapine (REMERON) 7.5 MG tablet take 1 tablet by mouth at bedtime Patient taking differently: take 15mg  by mouth at bedtime 01/12/17  Yes Medina-Vargas, Monina C, NP  nitroGLYCERIN (NITROSTAT) 0.4 MG SL tablet Place 1 tablet (0.4 mg total) under the tongue every 5 (five) minutes as needed for chest pain. 05/13/16  Yes Velvet Bathe, MD  Omega-3 Fatty Acids (FISH OIL) 1000 MG CAPS Take 1,000 mg by mouth 2 (two) times daily.    Yes [provider]  polyethylene glycol (MIRALAX / GLYCOLAX) packet Take 17 g by mouth daily. 07/04/18  Yes Kayleen Memos, DO  potassium chloride SA (K-DUR,KLOR-CON) 20 MEQ tablet take 2 tablets by mouth once daily Patient taking differently: Take 20 mEq by mouth daily.  01/12/17  Yes Medina-Vargas, Monina C, NP  QUEtiapine (SEROQUEL) 25 MG tablet Take 1 tablet (25 mg total) by mouth daily before breakfast. 11/22/17  Yes Charlynne Cousins, MD  tamsulosin (FLOMAX) 0.4 MG CAPS capsule Take 1 capsule (0.4 mg total) by mouth daily. 07/03/18  Yes Kayleen Memos, DO  thiamine (VITAMIN B-1) 100 MG tablet Take 100 mg by mouth daily.   Yes [provider]    triamterene-hydrochlorothiazide (DYAZIDE) 37.5-25 MG capsule Take 1 capsule by mouth daily. 10/04/17  Yes [provider]  ondansetron (ZOFRAN) 4 MG tablet Take 1 tablet (4 mg total) by mouth every 6 (six) hours for 12 doses. 08/02/18 08/05/18  Luigi Stuckey, DO  pantoprazole (PROTONIX) 20 MG tablet Take 1 tablet (20 mg total) by mouth daily. 08/02/18 09/01/18  Lennice Sites, DO    Family History Family History  Problem Relation Age of Onset  . Hypertension Mother   . Diabetes Mother   . Esophageal cancer Mother 37  . Prostate cancer Father 94  . Heart disease Maternal Grandfather        MI  . Heart attack Maternal Grandfather   . Prostate cancer Paternal Grandfather     Social History Social History   Tobacco Use  . Smoking status: Current Every Day Smoker    Packs/day: 0.50    Years: 30.00    Pack years: 15.00  Types: Cigarettes  . Smokeless tobacco: Never Used  Substance Use Topics  . Alcohol use: Yes    Alcohol/week: 56.0 standard drinks    Types: 56 Shots of liquor per week    Comment: last drink yesterday  . Drug use: No    Comment: Cocaine intermittently     Allergies   Lisinopril and Celecoxib   Review of Systems Review of Systems  Constitutional: Negative for chills and fever.  HENT: Negative for ear pain and sore throat.   Eyes: Negative for pain and visual disturbance.  Respiratory: Negative for cough and shortness of breath.   Cardiovascular: Negative for chest pain and palpitations.  Gastrointestinal: Positive for abdominal pain. Negative for anorexia, constipation, diarrhea, flatus, hematochezia, melena, nausea and vomiting.  Genitourinary: Negative for dysuria, frequency and hematuria.  Musculoskeletal: Negative for arthralgias, back pain and myalgias.  Skin: Negative for color change and rash.  Neurological: Negative for seizures, syncope and headaches.  All other systems reviewed and are negative.    Physical Exam Updated Vital  Signs  ED Triage Vitals  Enc Vitals Group     BP 08/02/18 1646 (!) 132/95     Pulse Rate 08/02/18 1646 (!) 107     Resp 08/02/18 1646 16     Temp 08/02/18 1646 98.4 F (36.9 C)     Temp Source 08/02/18 1646 Oral     SpO2 08/02/18 1645 99 %     Weight --      Height --      Head Circumference --      Peak Flow --      Pain Score 08/02/18 1701 10     Pain Loc --      Pain Edu? --      Excl. in [place]? --     Physical Exam  Constitutional: He appears well-developed and well-nourished.  HENT:  Head: Normocephalic and atraumatic.  Mouth/Throat: Oropharynx is clear and moist.  Eyes: Pupils are equal, round, and reactive to light. Conjunctivae and EOM are normal.  Neck: Neck supple.  Cardiovascular: Normal rate, regular rhythm, normal heart sounds and intact distal pulses.  No murmur heard. Pulmonary/Chest: Effort normal and breath sounds normal. No respiratory distress.  Abdominal: Soft. Normal appearance and bowel sounds are normal. There is generalized tenderness. There is no rigidity, no rebound, no guarding, no tenderness at McBurney's point and negative Murphy's sign.  Musculoskeletal: He exhibits no edema.  Neurological: He is alert.  Skin: Skin is warm and dry. Capillary refill takes less than 2 seconds.  Psychiatric: He has a normal mood and affect. His speech is normal and behavior is normal. Judgment and thought content normal. Cognition and memory are normal.  Nursing note and vitals reviewed.    ED Treatments / Results  Labs (all labs ordered are listed, but only abnormal results are displayed) Labs Reviewed  COMPREHENSIVE METABOLIC PANEL - Abnormal; Notable for the following components:      Result Value   CO2 19 (*)    AST 47 (*)    Anion gap 18 (*)    All other components within normal limits  CBC - Abnormal; Notable for the following components:   RBC 4.13 (*)    MCH 37.3 (*)    MCHC 37.7 (*)    All other components within normal limits  ETHANOL -  Abnormal; Notable for the following components:   Alcohol, Ethyl (B) 80 (*)    All other components within normal limits  LIPASE, BLOOD  URINALYSIS, ROUTINE W REFLEX MICROSCOPIC  TROPONIN I  AMMONIA  COMPREHENSIVE METABOLIC PANEL  CBC WITH DIFFERENTIAL/PLATELET  MAGNESIUM    EKG None  Radiology Ct Abdomen Pelvis W Contrast  Result Date: 08/02/2018 CLINICAL DATA:  Right lower quadrant pain EXAM: CT ABDOMEN AND PELVIS WITH CONTRAST TECHNIQUE: Multidetector CT imaging of the abdomen and pelvis was performed using the standard protocol following bolus administration of intravenous contrast. CONTRAST:  131mL ISOVUE-300 IOPAMIDOL (ISOVUE-300) INJECTION 61% COMPARISON:  11/11/2017, CT 02/21/2017, 02/16/2016 FINDINGS: Lower chest: Lung bases demonstrate no acute consolidation or effusion. Stable calcified granuloma in the posterior right lung base. Stable nodularity in the right middle lobe. Heart size within normal limits. Mild coronary calcification Hepatobiliary: Hepatic steatosis. No calcified gallstone or biliary dilatation Pancreas: Unremarkable. No pancreatic ductal dilatation or surrounding inflammatory changes. Spleen: Normal in size without focal abnormality. Adrenals/Urinary Tract: Adrenal glands are within normal limits. Stable small exophytic lesion upper pole right kidney. No hydronephrosis. The bladder is normal Stomach/Bowel: Stomach is within normal limits. Appendix appears normal. No evidence of bowel wall thickening, distention, or inflammatory changes. Vascular/Lymphatic: Nonaneurysmal aorta. Mild aortic atherosclerosis. No significant adenopathy Reproductive: Clips in the region of the prostate. Other: Negative for free air or free fluid. Musculoskeletal: No acute or suspicious bony abnormality. IMPRESSION: 1. No CT evidence for acute intra-abdominal or pelvic abnormality. Negative for appendicitis. 2. Hepatic steatosis Electronically Signed   By: Donavan Foil M.D.   On: 08/02/2018  20:40    Procedures Procedures (including critical care time)  Medications Ordered in ED Medications  ondansetron (ZOFRAN) injection 4 mg (4 mg Intravenous Not Given 08/02/18 2007)  iopamidol (ISOVUE-300) 61 % injection (has no administration in time range)  carvedilol (COREG) tablet 12.5 mg (has no administration in time range)  atorvastatin (LIPITOR) tablet 40 mg (has no administration in time range)  aspirin EC tablet 81 mg (has no administration in time range)  LORazepam (ATIVAN) tablet 1 mg (has no administration in time range)  buPROPion (WELLBUTRIN SR) 12 hr tablet 200 mg (has no administration in time range)  mirtazapine (REMERON) tablet 7.5 mg (has no administration in time range)  QUEtiapine (SEROQUEL) tablet 25 mg (has no administration in time range)  tamsulosin (FLOMAX) capsule 0.4 mg (has no administration in time range)  folic acid (FOLVITE) tablet 1 mg (has no administration in time range)  LORazepam (ATIVAN) tablet 1 mg (has no administration in time range)    Or  LORazepam (ATIVAN) injection 1 mg (has no administration in time range)  multivitamin with minerals tablet 1 tablet (has no administration in time range)  LORazepam (ATIVAN) injection 0-4 mg (has no administration in time range)    Followed by  LORazepam (ATIVAN) injection 0-4 mg (has no administration in time range)  enoxaparin (LOVENOX) injection 40 mg (has no administration in time range)  sodium chloride flush (NS) 0.9 % injection 3 mL (has no administration in time range)  ondansetron (ZOFRAN) tablet 4 mg (has no administration in time range)    Or  ondansetron (ZOFRAN) injection 4 mg (has no administration in time range)  albuterol (PROVENTIL) (2.5 MG/3ML) 0.083% nebulizer solution 2.5 mg (has no administration in time range)  sodium chloride 0.9 % 1,000 mL with thiamine 295 mg, folic acid 1 mg, multivitamins adult 10 mL infusion (has no administration in time range)  morphine 4 MG/ML injection 4 mg  (has no administration in time range)  thiamine 500mg  in normal saline (75ml) IVPB (has no administration in time range)  morphine 2 MG/ML injection 2 mg (has no administration in time range)  sodium chloride 0.9 % bolus 1,000 mL (0 mLs Intravenous Stopped 08/02/18 2356)  LORazepam (ATIVAN) injection 1 mg (1 mg Intravenous Given 08/02/18 1950)  iopamidol (ISOVUE-300) 61 % injection 100 mL (100 mLs Intravenous Contrast Given 08/02/18 2020)  ondansetron (ZOFRAN) injection 4 mg (4 mg Intravenous Given 08/02/18 2248)  LORazepam (ATIVAN) injection 1 mg (1 mg Intravenous Given 08/02/18 2355)  metoCLOPramide (REGLAN) injection 10 mg (10 mg Intravenous Given 08/03/18 0034)     Initial Impression / Assessment and Plan / ED Course  I have reviewed the triage vital signs and the nursing notes.  Pertinent labs & imaging results that were available during my care of the patient were reviewed by me and considered in my medical decision making (see chart for details).     Joshua Henson is a 73 year old male with history of alcohol abuse, CAD, stroke, depression who presents to the ED with abdominal pain.  Patient with unremarkable vitals upon arrival.  No fever.  Patient arrived to the ED with his wife who states that she is concerned for possible alcohol withdrawal.  IVC was filled out but was rescinded by myself after patient did not endorse any suicidal, homicidal ideation.  Patient without any hallucinations.  Patient overall mentating well.  Is able to make his own decisions.  With diffuse abdominal pain.  Has had some nausea.  Has a history of pancreatitis.  Denies any melena, hematochezia, hematemesis.  Patient does admit to alcohol use earlier this morning but has been drinking less than usual.  Does not appear clinically intoxicated at this time.  Lab work was done prior to my evaluation that was significant for a mildly elevated anion gap which is likely secondary to alcohol which is also elevated.  Patient  otherwise with normal lipase and doubt pancreatitis.  No other significant electrolyte abnormalities or acute kidney injury.  CT scan of the abdomen and pelvis did not show any acute findings. No concern for infectious process. Patient was given IV Ativan for nausea and tremors.  Patient also got IV Zofran and IV fluids.  Prior to discharge patient with worsening symptoms and had a CIWA score of 10 and was given additional IV Ativan and admitted for alcohol withdrawal.  Hemodynamically stable throughout my care.  Final Clinical Impressions(s) / ED Diagnoses   Final diagnoses:  Abdominal pain, unspecified abdominal location  Nausea  Alcohol withdrawal syndrome with complication [facility])    Pt admitted to medicine   Lennice Sites, DO 08/03/18 0129

## 2018-08-03 ENCOUNTER — Other Ambulatory Visit: Payer: Self-pay

## 2018-08-03 ENCOUNTER — Observation Stay (HOSPITAL_COMMUNITY): Payer: Medicare Other

## 2018-08-03 ENCOUNTER — Observation Stay (HOSPITAL_BASED_OUTPATIENT_CLINIC_OR_DEPARTMENT_OTHER): Payer: Medicare Other

## 2018-08-03 ENCOUNTER — Encounter (HOSPITAL_COMMUNITY): Payer: Self-pay | Admitting: Internal Medicine

## 2018-08-03 DIAGNOSIS — F10239 Alcohol dependence with withdrawal, unspecified: Secondary | ICD-10-CM

## 2018-08-03 DIAGNOSIS — R748 Abnormal levels of other serum enzymes: Secondary | ICD-10-CM | POA: Diagnosis not present

## 2018-08-03 DIAGNOSIS — F10229 Alcohol dependence with intoxication, unspecified: Secondary | ICD-10-CM | POA: Diagnosis not present

## 2018-08-03 DIAGNOSIS — R109 Unspecified abdominal pain: Secondary | ICD-10-CM | POA: Diagnosis present

## 2018-08-03 DIAGNOSIS — F419 Anxiety disorder, unspecified: Secondary | ICD-10-CM | POA: Diagnosis not present

## 2018-08-03 DIAGNOSIS — I10 Essential (primary) hypertension: Secondary | ICD-10-CM

## 2018-08-03 DIAGNOSIS — R079 Chest pain, unspecified: Secondary | ICD-10-CM | POA: Diagnosis not present

## 2018-08-03 DIAGNOSIS — I5032 Chronic diastolic (congestive) heart failure: Secondary | ICD-10-CM

## 2018-08-03 LAB — CBC WITH DIFFERENTIAL/PLATELET
BASOS ABS: 0 10*3/uL (ref 0.0–0.1)
Basophils Relative: 0 %
Eosinophils Absolute: 0 10*3/uL (ref 0.0–0.7)
Eosinophils Relative: 0 %
HEMATOCRIT: 41.1 % (ref 39.0–52.0)
Hemoglobin: 14.5 g/dL (ref 13.0–17.0)
LYMPHS ABS: 1.4 10*3/uL (ref 0.7–4.0)
LYMPHS PCT: 18 %
MCH: 34.2 pg — AB (ref 26.0–34.0)
MCHC: 35.3 g/dL (ref 30.0–36.0)
MCV: 96.9 fL (ref 78.0–100.0)
MONO ABS: 0.6 10*3/uL (ref 0.1–1.0)
Monocytes Relative: 8 %
NEUTROS ABS: 5.6 10*3/uL (ref 1.7–7.7)
Neutrophils Relative %: 74 %
Platelets: 222 10*3/uL (ref 150–400)
RBC: 4.24 MIL/uL (ref 4.22–5.81)
RDW: 14.8 % (ref 11.5–15.5)
WBC: 7.6 10*3/uL (ref 4.0–10.5)

## 2018-08-03 LAB — TROPONIN I
TROPONIN I: 0.74 ng/mL — AB (ref <0.03)
TROPONIN I: 0.55 ng/mL — AB (ref <0.03)
Troponin I: 0.7 ng/mL (ref <0.03)

## 2018-08-03 LAB — COMPREHENSIVE METABOLIC PANEL
ALBUMIN: 4.1 g/dL (ref 3.5–5.0)
ALT: 32 U/L (ref 0–44)
ANION GAP: 17 — AB (ref 5–15)
AST: 37 U/L (ref 15–41)
Alkaline Phosphatase: 67 U/L (ref 38–126)
BILIRUBIN TOTAL: 1.6 mg/dL — AB (ref 0.3–1.2)
BUN: 22 mg/dL (ref 8–23)
CHLORIDE: 106 mmol/L (ref 98–111)
CO2: 20 mmol/L — AB (ref 22–32)
Calcium: 9.1 mg/dL (ref 8.9–10.3)
Creatinine, Ser: 1.32 mg/dL — ABNORMAL HIGH (ref 0.61–1.24)
GFR calc Af Amer: >60 mL/min (ref >60)
GFR calc non Af Amer: 53 mL/min — ABNORMAL LOW (ref >60)
GLUCOSE: 96 mg/dL (ref 70–99)
POTASSIUM: 3.9 mmol/L (ref 3.5–5.1)
SODIUM: 143 mmol/L (ref 135–145)
TOTAL PROTEIN: 7.4 g/dL (ref 6.5–8.1)

## 2018-08-03 LAB — RAPID URINE DRUG SCREEN, HOSP PERFORMED
Amphetamines: NOT DETECTED
BENZODIAZEPINES: POSITIVE — AB
Barbiturates: NOT DETECTED
Cocaine: NOT DETECTED
TETRAHYDROCANNABINOL: POSITIVE — AB

## 2018-08-03 LAB — URINALYSIS, ROUTINE W REFLEX MICROSCOPIC
Bilirubin Urine: NEGATIVE
Glucose, UA: NEGATIVE mg/dL
Hgb urine dipstick: NEGATIVE
Ketones, ur: 20 mg/dL — AB
Leukocytes, UA: NEGATIVE
NITRITE: NEGATIVE
PH: 5 (ref 5.0–8.0)
Protein, ur: NEGATIVE mg/dL
Specific Gravity, Urine: 1.035 — ABNORMAL HIGH (ref 1.005–1.030)

## 2018-08-03 LAB — ECHOCARDIOGRAM LIMITED

## 2018-08-03 LAB — MAGNESIUM: Magnesium: 1.8 mg/dL (ref 1.7–2.4)

## 2018-08-03 LAB — AMMONIA: AMMONIA: 14 umol/L (ref 9–35)

## 2018-08-03 MED ORDER — QUETIAPINE FUMARATE 25 MG PO TABS
25.0000 mg | ORAL_TABLET | Freq: Every day | ORAL | Status: DC
  Administered 2018-08-03 – 2018-08-04 (×2): 25 mg via ORAL
  Filled 2018-08-03 (×2): qty 1

## 2018-08-03 MED ORDER — LORAZEPAM 1 MG PO TABS
1.0000 mg | ORAL_TABLET | Freq: Four times a day (QID) | ORAL | Status: DC | PRN
  Administered 2018-08-03 – 2018-08-04 (×2): 1 mg via ORAL
  Filled 2018-08-03 (×3): qty 1

## 2018-08-03 MED ORDER — MIRTAZAPINE 7.5 MG PO TABS
7.5000 mg | ORAL_TABLET | Freq: Every day | ORAL | Status: DC
  Administered 2018-08-03: 7.5 mg via ORAL
  Filled 2018-08-03: qty 1

## 2018-08-03 MED ORDER — TAMSULOSIN HCL 0.4 MG PO CAPS
0.4000 mg | ORAL_CAPSULE | Freq: Every day | ORAL | Status: DC
  Administered 2018-08-03 – 2018-08-04 (×2): 0.4 mg via ORAL
  Filled 2018-08-03 (×2): qty 1

## 2018-08-03 MED ORDER — ONDANSETRON HCL 4 MG PO TABS: 4.0000 mg | ORAL_TABLET | Freq: Four times a day (QID) | ORAL | Status: DC | PRN

## 2018-08-03 MED ORDER — CARVEDILOL 12.5 MG PO TABS
12.5000 mg | ORAL_TABLET | Freq: Two times a day (BID) | ORAL | Status: DC
  Administered 2018-08-03 – 2018-08-04 (×4): 12.5 mg via ORAL
  Filled 2018-08-03 (×4): qty 1

## 2018-08-03 MED ORDER — M.V.I. ADULT IV INJ
INJECTION | Freq: Once | INTRAVENOUS | Status: AC
  Administered 2018-08-03: 02:00:00 via INTRAVENOUS
  Filled 2018-08-03: qty 1000

## 2018-08-03 MED ORDER — ASPIRIN EC 81 MG PO TBEC
81.0000 mg | DELAYED_RELEASE_TABLET | Freq: Every day | ORAL | Status: DC
  Administered 2018-08-03 – 2018-08-04 (×2): 81 mg via ORAL
  Filled 2018-08-03 (×2): qty 1

## 2018-08-03 MED ORDER — BUPROPION HCL ER (SR) 100 MG PO TB12
200.0000 mg | ORAL_TABLET | Freq: Every day | ORAL | Status: DC
  Administered 2018-08-03 – 2018-08-04 (×2): 200 mg via ORAL
  Filled 2018-08-03 (×2): qty 2

## 2018-08-03 MED ORDER — METOCLOPRAMIDE HCL 5 MG/ML IJ SOLN
10.0000 mg | Freq: Once | INTRAMUSCULAR | Status: AC
  Administered 2018-08-03: 10 mg via INTRAVENOUS
  Filled 2018-08-03: qty 2

## 2018-08-03 MED ORDER — ENOXAPARIN SODIUM 40 MG/0.4ML ~~LOC~~ SOLN
40.0000 mg | SUBCUTANEOUS | Status: DC
  Administered 2018-08-03 – 2018-08-04 (×2): 40 mg via SUBCUTANEOUS
  Filled 2018-08-03 (×2): qty 0.4

## 2018-08-03 MED ORDER — LORAZEPAM 1 MG PO TABS
1.0000 mg | ORAL_TABLET | Freq: Two times a day (BID) | ORAL | Status: DC | PRN
  Administered 2018-08-04: 1 mg via ORAL

## 2018-08-03 MED ORDER — ADULT MULTIVITAMIN W/MINERALS CH
1.0000 | ORAL_TABLET | Freq: Every day | ORAL | Status: DC
  Administered 2018-08-03: 1 via ORAL
  Filled 2018-08-03 (×2): qty 1

## 2018-08-03 MED ORDER — MORPHINE SULFATE (PF) 2 MG/ML IV SOLN
2.0000 mg | INTRAVENOUS | Status: DC | PRN
  Administered 2018-08-03: 2 mg via INTRAVENOUS
  Filled 2018-08-03: qty 1

## 2018-08-03 MED ORDER — ALBUTEROL SULFATE (2.5 MG/3ML) 0.083% IN NEBU: 2.5000 mg | INHALATION_SOLUTION | Freq: Four times a day (QID) | RESPIRATORY_TRACT | Status: DC | PRN

## 2018-08-03 MED ORDER — LORAZEPAM 2 MG/ML IJ SOLN
0.0000 mg | Freq: Four times a day (QID) | INTRAMUSCULAR | Status: DC
  Administered 2018-08-03 (×2): 2 mg via INTRAVENOUS
  Administered 2018-08-04: 1 mg via INTRAVENOUS
  Filled 2018-08-03 (×3): qty 1

## 2018-08-03 MED ORDER — FAMOTIDINE 20 MG PO TABS
20.0000 mg | ORAL_TABLET | Freq: Two times a day (BID) | ORAL | Status: DC
  Administered 2018-08-03 – 2018-08-04 (×2): 20 mg via ORAL
  Filled 2018-08-03 (×2): qty 1

## 2018-08-03 MED ORDER — FOLIC ACID 1 MG PO TABS
1.0000 mg | ORAL_TABLET | Freq: Every day | ORAL | Status: DC
  Administered 2018-08-03 – 2018-08-04 (×2): 1 mg via ORAL
  Filled 2018-08-03 (×2): qty 1

## 2018-08-03 MED ORDER — ONDANSETRON HCL 4 MG/2ML IJ SOLN: 4.0000 mg | Freq: Four times a day (QID) | INTRAMUSCULAR | Status: DC | PRN

## 2018-08-03 MED ORDER — ATORVASTATIN CALCIUM 40 MG PO TABS
40.0000 mg | ORAL_TABLET | Freq: Every day | ORAL | Status: DC
  Administered 2018-08-03: 40 mg via ORAL
  Filled 2018-08-03: qty 1

## 2018-08-03 MED ORDER — SODIUM CHLORIDE 0.9% FLUSH
3.0000 mL | Freq: Two times a day (BID) | INTRAVENOUS | Status: DC
  Administered 2018-08-03 – 2018-08-04 (×3): 3 mL via INTRAVENOUS

## 2018-08-03 MED ORDER — THIAMINE HCL 100 MG/ML IJ SOLN: 100.0000 mg | Freq: Every day | INTRAMUSCULAR | Status: DC

## 2018-08-03 MED ORDER — MORPHINE SULFATE (PF) 4 MG/ML IV SOLN
4.0000 mg | INTRAVENOUS | Status: AC
  Administered 2018-08-03: 4 mg via INTRAVENOUS
  Filled 2018-08-03: qty 1

## 2018-08-03 MED ORDER — LORAZEPAM 2 MG/ML IJ SOLN: 0.0000 mg | Freq: Two times a day (BID) | INTRAMUSCULAR | Status: DC

## 2018-08-03 MED ORDER — LORAZEPAM 2 MG/ML IJ SOLN: 1.0000 mg | Freq: Four times a day (QID) | INTRAMUSCULAR | Status: DC | PRN

## 2018-08-03 MED ORDER — FAMOTIDINE IN NACL 20-0.9 MG/50ML-% IV SOLN
20.0000 mg | Freq: Two times a day (BID) | INTRAVENOUS | Status: DC
  Administered 2018-08-03 (×2): 20 mg via INTRAVENOUS
  Filled 2018-08-03 (×2): qty 50

## 2018-08-03 MED ORDER — VITAMIN B-1 100 MG PO TABS: 100.0000 mg | ORAL_TABLET | Freq: Every day | ORAL | Status: DC

## 2018-08-03 MED ORDER — THIAMINE HCL 100 MG/ML IJ SOLN
500.0000 mg | Freq: Three times a day (TID) | INTRAVENOUS | Status: DC
  Administered 2018-08-03 – 2018-08-04 (×4): 500 mg via INTRAVENOUS
  Filled 2018-08-03 (×6): qty 5

## 2018-08-03 NOTE — Progress Notes (Signed)
  Echocardiogram 2D Echocardiogram has been performed.  Izyk Marty L Androw 08/03/2018, 1:50 PM

## 2018-08-03 NOTE — Progress Notes (Signed)
CRITICAL VALUE ALERT  Critical Value: Troponin 0.70  Date & Time Notied:  08/03/18 @ 0845  Provider Notified: Starla Link MD   Orders Received/Actions taken: notified MD awaiting response

## 2018-08-03 NOTE — Progress Notes (Addendum)
LCSW consulted due to missing IVC paperwork.    Wife concerned that floor doesn't have IVC paperwork. Per RN attending states patient's IVC has been rescinded. LCSW notified patient's spouse. Spouse is upset that patient is not IVC'd. LCSW explained that if patient tried to leave AMA and attending feels that he needs to be IVC'd we can do so from the hospital.   Spouse asked LCSW to apologize to RN for her. Spouse states " I am not upset with her, I'm just frustrated with the situation."   Joshua Henson, [employer] [place]

## 2018-08-03 NOTE — Care Management Obs Status (Signed)
[place] NOTIFICATION   Patient Details  Name: Joshua Henson MRN: [id number] Date of Birth: 19-Jul-1946   Medicare Observation Status Notification Given:  Other (see comment) [place]on intoxication unableto sign no family present   Leeroy Cha, RN 08/03/2018, 11:35 AM

## 2018-08-03 NOTE — Progress Notes (Signed)
Patient's wife called the RN very upset and cursing related to patient's IVC paperwork missing. She stated "yall need to get your shit together because he is IVC'd and papers were served." I explained to her that we did not have any documentation. Patient has been requesting to leave and MD has been aware. I advised for her to talk to the social worker who was on the unit at the moment of the phone call. Social worker will follow up to find the paperwork. MD notified regarding the possibility of patient being IVC. Rn will continue to monitor the patient.

## 2018-08-03 NOTE — Progress Notes (Signed)
PHARMACIST - PHYSICIAN COMMUNICATION   CONCERNING: IV to Oral Route Change Policy  RECOMMENDATION: This patient is receiving pepcid by the intravenous route.  Based on criteria approved by the Pharmacy and Therapeutics Committee, the intravenous medication(s) is/are being converted to the equivalent oral dose form(s).   DESCRIPTION: These criteria include:  The patient is eating (either orally or via tube) and/or has been taking other orally administered medications for a least 24 hours  The patient has no evidence of active gastrointestinal bleeding or impaired GI absorption (gastrectomy, short bowel, patient on TNA or NPO).  If you have questions about this conversion, please contact the Pharmacy Department  []   [phone] )  [facility] []   [phone] )  [facility] []   [phone] )  [facility] []   [phone] )  [facility] [x]   [phone] )  [place], [place].D [phone] 08/03/2018 1:04 PM

## 2018-08-03 NOTE — Progress Notes (Signed)
Patient ID: Joshua Henson, male   DOB: 1946-04-12, 72 y.o.   MRN: [id number] Patient was admitted early this morning for abdominal pain along with alcohol intoxication.  He was started on IV fluids.  Troponin on admission was 0.74.  Patient seen and examined at bedside.  Plan of care discussed with him.  He currently denies any abdominal pain or vomiting.  No current chest pain.  He did not have any chest pain overnight.  His prior medical records including this morning's H&P was reviewed by myself.  Continue current management.  Start clear liquid diet and advance as tolerated.  I have requested cardiology for consult for troponin elevation.  Cycle troponins.  Repeat a.m. labs.  Probable discharge in a.m. if patient remains stable.

## 2018-08-03 NOTE — ED Notes (Signed)
ED TO INPATIENT HANDOFF REPORT  Name/Age/Gender Joshua Henson 72 y.o. male  Code Status    Code Status Orders  (From admission, onward)         Start     Ordered   08/03/18 0038  Full code  Continuous     08/03/18 0040        Code Status History    Date Active Date Inactive Code Status Order ID Comments User Context   07/01/2018 0024 07/03/2018 1740 Full Code [id number]  Jani Gravel, MD ED   11/09/2017 1626 11/21/2017 1846 Full Code [id number]  Geradine Girt, DO ED   11/09/2017 1256 11/09/2017 1625 Full Code [id number]  Blanchie Dessert, MD ED   02/15/2017 0753 02/23/2017 2014 Full Code [id number]  Reyne Dumas, MD ED   09/13/2016 1830 09/23/2016 1759 Full Code [id number]  Mariel Aloe, MD Inpatient   05/10/2016 0149 05/13/2016 1651 Full Code [id number]  Toy Baker, MD ED   03/23/2016 1648 04/04/2016 1636 Full Code [id number]  Cristal Ford, DO Inpatient   01/18/2016 0925 01/19/2016 2146 Full Code [id number]  Cherene Altes, MD Inpatient   01/15/2016 1537 01/18/2016 0925 Full Code [id number]  Burnell Blanks, MD Inpatient   01/10/2016 2158 01/15/2016 1537 Full Code [id number]  Norval Morton, MD Inpatient   04/02/2015 1929 04/08/2015 1938 Full Code [id number]  Deneise Lever, MD ED   12/09/2014 2227 12/11/2014 1214 Full Code [id number]  Evelina Bucy, MD ED   12/09/2014 1622 12/09/2014 2227 Full Code [id number]  Charlott Rakes, MD ED   09/03/2014 0119 09/12/2014 1550 Full Code [id number]  Allie Bossier, MD ED   07/07/2014 1631 07/11/2014 1739 Full Code [id number]  Cathlyn Parsons, PA-C Inpatient   07/02/2014 0338 07/07/2014 1631 Full Code [id number]  Theressa Millard, MD Inpatient      Home/SNF/Other Home  Chief Complaint abdominal pain  Level of Care/Admitting Diagnosis ED Disposition    ED Disposition Condition [facility] Area: [facility] [[id number]]  Level of Care: Telemetry [5]  Admit to tele based on following criteria: Complex  arrhythmia (Bradycardia/Tachycardia)  Diagnosis: Alcohol dependence with acute alcoholic intoxication, with unspecified complication [facility]) [[id number]]  Admitting Physician: Norval Morton [[id number]]  Attending Physician: Norval Morton [[id number]]  PT Class (Do Not Modify): Observation [104]  PT Acc Code (Do Not Modify): Observation [10022]       Medical History Past Medical History:  Diagnosis Date  . Acute hepatic encephalopathy   . Alcoholic cirrhosis of liver without ascites ([place])   . Anxiety state   . CAD (coronary artery disease), native coronary artery 01/10/2016   3 vessel coronary calcification noted on CT scan 04/03/15 /  cardiac cath 2017 with 60-70% stenosis in non dominant RCA and nonobstructive disease in LAD   . Depression   . Fatty liver, alcoholic 0/17/5102  . Hypertension   . Increased ammonia level   . Insomnia   . NSTEMI (non-ST elevated myocardial infarction) ([place])   . Prostate cancer ([place]) 2010   External beam radiation (urol - Risa Grill, XRT Valere Dross)  . Stroke ([place])   . Tachycardia     Allergies Allergies  Allergen Reactions  . Lisinopril     syncope  . Celecoxib Rash    IV Location/Drains/Wounds Patient Lines/Drains/Airways Status   Active Line/Drains/Airways    Name:   Placement date:   Placement time:   Site:   Days:   Peripheral IV 08/02/18 Right;Posterior  Hand   08/02/18    1959    Hand   1          Labs/Imaging Results for orders placed or performed during the hospital encounter of 08/02/18 (from the past 48 hour(s))  Lipase, blood     Status: None   Collection Time: 08/02/18  5:21 PM  Result Value Ref Range   Lipase 28 11 - 51 U/L    Comment: Performed at [facility], [place] [address], [place], [place] [address]  Comprehensive metabolic panel     Status: Abnormal   Collection Time: 08/02/18  5:21 PM  Result Value Ref Range   Sodium 142 135 - 145 mmol/L   Potassium 4.1 3.5 - 5.1 mmol/L   Chloride 105 98 - 111  mmol/L   CO2 19 (L) 22 - 32 mmol/L   Glucose, Bld 78 70 - 99 mg/dL   BUN 21 8 - 23 mg/dL   Creatinine, Ser 1.19 0.61 - 1.24 mg/dL   Calcium 9.5 8.9 - 10.3 mg/dL   Total Protein 7.5 6.5 - 8.1 g/dL   Albumin 4.1 3.5 - 5.0 g/dL   AST 47 (H) 15 - 41 U/L   ALT 35 0 - 44 U/L   Alkaline Phosphatase 67 38 - 126 U/L   Total Bilirubin 1.0 0.3 - 1.2 mg/dL   GFR calc non Af Amer >60 >60 mL/min   GFR calc Af Amer >60 >60 mL/min    Comment: (NOTE) The eGFR has been calculated using the CKD EPI equation. This calculation has not been validated in all clinical situations. eGFR's persistently <60 mL/min signify possible Chronic Kidney Disease.    Anion gap 18 (H) 5 - 15    Comment: Performed at [facility], [place] [address] [place], [place] [address]  CBC     Status: Abnormal   Collection Time: 08/02/18  5:21 PM  Result Value Ref Range   WBC 6.3 4.0 - 10.5 K/uL   RBC 4.13 (L) 4.22 - 5.81 MIL/uL   Hemoglobin 15.4 13.0 - 17.0 g/dL   HCT 40.9 39.0 - 52.0 %   MCV 99.0 78.0 - 100.0 fL   MCH 37.3 (H) 26.0 - 34.0 pg   MCHC 37.7 (H) 30.0 - 36.0 g/dL    Comment: RULED OUT INTERFERING SUBSTANCES   RDW 14.8 11.5 - 15.5 %   Platelets 259 150 - 400 K/uL    Comment: Performed at [facility], [place] [address], [place], [place] [address]  Ethanol     Status: Abnormal   Collection Time: 08/02/18  7:58 PM  Result Value Ref Range   Alcohol, Ethyl (B) 80 (H) <10 mg/dL    Comment: (NOTE) Lowest detectable limit for serum alcohol is 10 mg/dL. For medical purposes only. Performed at [facility], [place] [address] [place], [place] [address]    Ct Abdomen Pelvis W Contrast  Result Date: 08/02/2018 CLINICAL DATA:  Right lower quadrant pain EXAM: CT ABDOMEN AND PELVIS WITH CONTRAST TECHNIQUE: Multidetector CT imaging of the abdomen and pelvis was performed using the standard protocol following bolus administration of intravenous contrast. CONTRAST:  140m  ISOVUE-300 IOPAMIDOL (ISOVUE-300) INJECTION 61% COMPARISON:  11/11/2017, CT 02/21/2017, 02/16/2016 FINDINGS: Lower chest: Lung bases demonstrate no acute consolidation or effusion. Stable calcified granuloma in the posterior right lung base. Stable nodularity in the right middle lobe. Heart size within normal limits. Mild coronary calcification Hepatobiliary: Hepatic steatosis. No calcified gallstone or biliary dilatation  Pancreas: Unremarkable. No pancreatic ductal dilatation or surrounding inflammatory changes. Spleen: Normal in size without focal abnormality. Adrenals/Urinary Tract: Adrenal glands are within normal limits. Stable small exophytic lesion upper pole right kidney. No hydronephrosis. The bladder is normal Stomach/Bowel: Stomach is within normal limits. Appendix appears normal. No evidence of bowel wall thickening, distention, or inflammatory changes. Vascular/Lymphatic: Nonaneurysmal aorta. Mild aortic atherosclerosis. No significant adenopathy Reproductive: Clips in the region of the prostate. Other: Negative for free air or free fluid. Musculoskeletal: No acute or suspicious bony abnormality. IMPRESSION: 1. No CT evidence for acute intra-abdominal or pelvic abnormality. Negative for appendicitis. 2. Hepatic steatosis Electronically Signed   By: Donavan Foil M.D.   On: 08/02/2018 20:40    Pending Labs Unresulted Labs (From admission, onward)    Start     Ordered   08/03/18 0500  Ammonia  Tomorrow morning,   R     08/03/18 0121   08/03/18 0500  Comprehensive metabolic panel  Tomorrow morning,   R     08/03/18 0121   08/03/18 0500  CBC with Differential/Platelet  Tomorrow morning,   R     08/03/18 0121   08/03/18 0500  Magnesium  Tomorrow morning,   R     08/03/18 0121   08/03/18 0034  Troponin I  Once,   R     08/03/18 0040   08/02/18 1702  Urinalysis, Routine w reflex microscopic  STAT,   STAT     08/02/18 1701          Vitals/Pain Today's Vitals   08/02/18 1701 08/02/18  2006 08/02/18 2006 08/02/18 2142  BP:  (!) 152/106 (!) 152/106 (!) 154/107  Pulse:  97 97 97  Resp:  17  (!) 8  Temp:      TempSrc:      SpO2:  100%  99%  PainSc: 10-Worst pain ever       Isolation Precautions No active isolations  Medications Medications  ondansetron (ZOFRAN) injection 4 mg (4 mg Intravenous Not Given 08/02/18 2007)  iopamidol (ISOVUE-300) 61 % injection (has no administration in time range)  carvedilol (COREG) tablet 12.5 mg (has no administration in time range)  atorvastatin (LIPITOR) tablet 40 mg (has no administration in time range)  aspirin EC tablet 81 mg (has no administration in time range)  LORazepam (ATIVAN) tablet 1 mg (has no administration in time range)  buPROPion (WELLBUTRIN SR) 12 hr tablet 200 mg (has no administration in time range)  mirtazapine (REMERON) tablet 7.5 mg (has no administration in time range)  QUEtiapine (SEROQUEL) tablet 25 mg (has no administration in time range)  tamsulosin (FLOMAX) capsule 0.4 mg (has no administration in time range)  folic acid (FOLVITE) tablet 1 mg (has no administration in time range)  LORazepam (ATIVAN) tablet 1 mg (has no administration in time range)    Or  LORazepam (ATIVAN) injection 1 mg (has no administration in time range)  multivitamin with minerals tablet 1 tablet (has no administration in time range)  LORazepam (ATIVAN) injection 0-4 mg (has no administration in time range)    Followed by  LORazepam (ATIVAN) injection 0-4 mg (has no administration in time range)  enoxaparin (LOVENOX) injection 40 mg (has no administration in time range)  sodium chloride flush (NS) 0.9 % injection 3 mL (has no administration in time range)  ondansetron (ZOFRAN) tablet 4 mg (has no administration in time range)    Or  ondansetron (ZOFRAN) injection 4 mg (has no administration in time range)  albuterol (PROVENTIL) (2.5 MG/3ML) 0.083% nebulizer solution 2.5 mg (has no administration in time range)  sodium chloride  0.9 % 1,000 mL with thiamine 706 mg, folic acid 1 mg, multivitamins adult 10 mL infusion (has no administration in time range)  morphine 4 MG/ML injection 4 mg (has no administration in time range)  thiamine 577m in normal saline (581m IVPB (has no administration in time range)  morphine 2 MG/ML injection 2 mg (has no administration in time range)  famotidine (PEPCID) IVPB 20 mg premix (has no administration in time range)  sodium chloride 0.9 % bolus 1,000 mL (0 mLs Intravenous Stopped 08/02/18 2356)  LORazepam (ATIVAN) injection 1 mg (1 mg Intravenous Given 08/02/18 1950)  iopamidol (ISOVUE-300) 61 % injection 100 mL (100 mLs Intravenous Contrast Given 08/02/18 2020)  ondansetron (ZOFRAN) injection 4 mg (4 mg Intravenous Given 08/02/18 2248)  LORazepam (ATIVAN) injection 1 mg (1 mg Intravenous Given 08/02/18 2355)  metoCLOPramide (REGLAN) injection 10 mg (10 mg Intravenous Given 08/03/18 0034)    Mobility walks with person assist

## 2018-08-03 NOTE — H&P (Addendum)
History and Physical    Joshua Henson DVV:[id number] DOB: 1946/08/10 DOA: 08/02/2018  Referring MD/NP/PA: Lennice Sites, DO PCP: Clovia Cuff, MD  Patient coming from:  Home via EMS  Chief Complaint: Abdominal pain  I have personally briefly reviewed patient's old medical records in [place]   HPI: Joshua Henson is a 72 y.o. male with medical history significant of  alcohol abuse, cirrhosis, CAD, CVA, and prostate cancer; who presents with complaints of right-sided abdominal pain with radiation across the abdomen.  Patient is a poor historian at this time, and additional history is obtained from his wife who is present at bedside.  Symptoms possibly started sometime last night, but became more severe and constant later this afternoon to the point patient called EMS.  Associated symptoms include nausea, vomiting, and coughing with eating/drinking.  Emesis has been nonbloody and nonbilious in appearance.  Patient had just been recently hospitalized in July with NSTEMI.  Per wife he had stopped drinking for approximately 6 months, but restarted in June.  Drinking approximately 1/5 of vodka per day on average.  She also notes that with drinking and eating patient can sometimes get choked up get things down the wrong way.  ED Course: On admission to the emergency department patient was noted to be afebrile, heart rates up to 107, and all other vital signs stable.  Labs were significant for glucose 79, anion gap of 18, lipase 26, AST 47, ALT 35, alcohol level of 80, and all other labs within normal limits.  Patient was initially involuntarily committed, but this was later rescinded.  CT scan of the abdomen and pelvis revealed no acute abnormality.  Patient was given a total of 8 mg of Zofran, 1 mg of Ativan, 1 L normal saline IV fluids.  However patient was still noted to have persistent nausea and vomiting.  TRH called to admit for suspected alcohol withdrawals.  Review of Systems  Unable to  perform ROS: Mental status change  Respiratory: Negative for shortness of breath.   Cardiovascular: Negative for chest pain.  Gastrointestinal: Positive for abdominal pain, nausea and vomiting.  Psychiatric/Behavioral: Positive for substance abuse.    Past Medical History:  Diagnosis Date  . Acute hepatic encephalopathy   . Alcoholic cirrhosis of liver without ascites ([place])   . Anxiety state   . CAD (coronary artery disease), native coronary artery 01/10/2016   3 vessel coronary calcification noted on CT scan 04/03/15 /  cardiac cath 2017 with 60-70% stenosis in non dominant RCA and nonobstructive disease in LAD   . Depression   . Fatty liver, alcoholic 06/13/9484  . Hypertension   . Increased ammonia level   . Insomnia   . NSTEMI (non-ST elevated myocardial infarction) ([place])   . Prostate cancer ([place]) 2010   External beam radiation (urol - Risa Grill, XRT Valere Dross)  . Stroke ([place])   . Tachycardia     Past Surgical History:  Procedure Laterality Date  . CARDIAC CATHETERIZATION N/A 01/15/2016   Procedure: Left Heart Cath and Coronary Angiography;  Surgeon: Burnell Blanks, MD;  Location: [facility];  Service: Cardiovascular;  Laterality: N/A;     reports that he has been smoking cigarettes. He has a 15.00 pack-year smoking history. He has never used smokeless tobacco. He reports that he drinks about 56.0 standard drinks of alcohol per week. He reports that he does not use drugs.  Allergies  Allergen Reactions  . Lisinopril     syncope  .  Celecoxib Rash    Family History  Problem Relation Age of Onset  . Hypertension Mother   . Diabetes Mother   . Esophageal cancer Mother 55  . Prostate cancer Father 55  . Heart disease Maternal Grandfather        MI  . Heart attack Maternal Grandfather   . Prostate cancer Paternal Grandfather     Prior to Admission medications   Medication Sig Start Date End Date Taking? Authorizing Provider  aspirin 81 MG EC tablet Take 1  tablet (81 mg total) by mouth daily. 07/11/14  Yes Angiulli, Lavon Paganini, PA-C  atorvastatin (LIPITOR) 40 MG tablet Take 1 tablet (40 mg total) by mouth daily at 6 PM. 01/19/16  Yes Allie Bossier, MD  buPROPion [facility] SR) 200 MG 12 hr tablet Take 200 mg by mouth daily.   Yes [provider]  carvedilol (COREG) 12.5 MG tablet Take 1 tablet (12.5 mg total) by mouth 2 (two) times daily. 02/23/17  Yes Florencia Reasons, MD  cholecalciferol (VITAMIN D) 1000 units tablet Take 1,000 Units by mouth 2 (two) times daily.    Yes [provider]  Coenzyme Q10 (CO Q-10) 200 MG CAPS Take 200 mg by mouth daily.   Yes [provider]  folic acid (FOLVITE) 1 MG tablet take 1 tablet by mouth once daily 01/12/17  Yes Medina-Vargas, Monina C, NP  LORazepam (ATIVAN) 1 MG tablet Take 1 tablet (1 mg total) by mouth 2 (two) times daily as needed for anxiety. 02/23/17  Yes Florencia Reasons, MD  Magnesium 500 MG CAPS Take 500 mg by mouth at bedtime.   Yes [provider]  mirtazapine (REMERON) 7.5 MG tablet take 1 tablet by mouth at bedtime Patient taking differently: take 15mg  by mouth at bedtime 01/12/17  Yes Medina-Vargas, Monina C, NP  nitroGLYCERIN (NITROSTAT) 0.4 MG SL tablet Place 1 tablet (0.4 mg total) under the tongue every 5 (five) minutes as needed for chest pain. 05/13/16  Yes Velvet Bathe, MD  Omega-3 Fatty Acids (FISH OIL) 1000 MG CAPS Take 1,000 mg by mouth 2 (two) times daily.    Yes [provider]  polyethylene glycol (MIRALAX / GLYCOLAX) packet Take 17 g by mouth daily. 07/04/18  Yes Kayleen Memos, DO  potassium chloride SA (K-DUR,KLOR-CON) 20 MEQ tablet take 2 tablets by mouth once daily Patient taking differently: Take 20 mEq by mouth daily.  01/12/17  Yes Medina-Vargas, Monina C, NP  QUEtiapine (SEROQUEL) 25 MG tablet Take 1 tablet (25 mg total) by mouth daily before breakfast. 11/22/17  Yes Charlynne Cousins, MD  tamsulosin (FLOMAX) 0.4 MG CAPS capsule Take 1 capsule (0.4 mg  total) by mouth daily. 07/03/18  Yes Kayleen Memos, DO  thiamine (VITAMIN B-1) 100 MG tablet Take 100 mg by mouth daily.   Yes [provider]  triamterene-hydrochlorothiazide (DYAZIDE) 37.5-25 MG capsule Take 1 capsule by mouth daily. 10/04/17  Yes [provider]  ondansetron (ZOFRAN) 4 MG tablet Take 1 tablet (4 mg total) by mouth every 6 (six) hours for 12 doses. 08/02/18 08/05/18  Curatolo, Adam, DO  pantoprazole (PROTONIX) 20 MG tablet Take 1 tablet (20 mg total) by mouth daily. 08/02/18 09/01/18  Lennice Sites, DO    Physical Exam:  Constitutional: Older male who appears in NAD, calm, comfortable Vitals:   08/02/18 1646 08/02/18 2006 08/02/18 2006 08/02/18 2142  BP: (!) 132/95 (!) 152/106 (!) 152/106 (!) 154/107  Pulse: (!) 107 97 97 97  Resp: 16  17  (!) 8  Temp: 98.4 F (36.9 C)     TempSrc: Oral     SpO2: 99% 100%  99%   Eyes: PERRL, lids and conjunctivae normal ENMT: Mucous membranes are moist. Posterior pharynx clear of any exudate or lesions. Patient with multiple missing teeth wears dentures. Neck: normal, supple, no masses, no thyromegaly Respiratory: Rales present on the right lung field.  No wheezes or rhonchi appreciated.  Patient able to talk in complete sentences. Cardiovascular: Tachycardic, no murmurs / rubs / gallops. No extremity edema. 2+ pedal pulses. No carotid bruits.  Abdomen: Generalized tenderness to deep palpation, no masses palpated. No hepatosplenomegaly. Bowel sounds positive.  Musculoskeletal: no clubbing / cyanosis. No joint deformity upper and lower extremities. Good ROM, no contractures. Normal muscle tone.  Skin: no rashes, lesions, ulcers. No induration Neurologic: CN 2-12 grossly intact. Sensation intact, DTR normal. Strength 5/5 in all 4.  Patient is tremulous. Psychiatric: Poor judgment.  Patient is intermittently confused with confabulation.  Anxious mood.    Labs on Admission: I have personally reviewed following labs and  imaging studies  CBC: Recent Labs  Lab 08/02/18 1721  WBC 6.3  HGB 15.4  HCT 40.9  MCV 99.0  PLT 347   Basic Metabolic Panel: Recent Labs  Lab 08/02/18 1721  NA 142  K 4.1  CL 105  CO2 19*  GLUCOSE 78  BUN 21  CREATININE 1.19  CALCIUM 9.5   GFR: CrCl cannot be calculated (Unknown ideal weight.). Liver Function Tests: Recent Labs  Lab 08/02/18 1721  AST 47*  ALT 35  ALKPHOS 67  BILITOT 1.0  PROT 7.5  ALBUMIN 4.1   Recent Labs  Lab 08/02/18 1721  LIPASE 28   No results for input(s): AMMONIA in the last 168 hours. Coagulation Profile: No results for input(s): INR, PROTIME in the last 168 hours. Cardiac Enzymes: No results for input(s): CKTOTAL, CKMB, CKMBINDEX, TROPONINI in the last 168 hours. BNP (last 3 results) No results for input(s): PROBNP in the last 8760 hours. HbA1C: No results for input(s): HGBA1C in the last 72 hours. CBG: No results for input(s): GLUCAP in the last 168 hours. Lipid Profile: No results for input(s): CHOL, HDL, LDLCALC, TRIG, CHOLHDL, LDLDIRECT in the last 72 hours. Thyroid Function Tests: No results for input(s): TSH, T4TOTAL, FREET4, T3FREE, THYROIDAB in the last 72 hours. Anemia Panel: No results for input(s): VITAMINB12, FOLATE, FERRITIN, TIBC, IRON, RETICCTPCT in the last 72 hours. Urine analysis:    Component Value Date/Time   COLORURINE YELLOW 07/01/2018 0902   APPEARANCEUR CLEAR 07/01/2018 0902   LABSPEC 1.025 07/01/2018 0902   PHURINE 5.0 07/01/2018 0902   GLUCOSEU NEGATIVE 07/01/2018 0902   HGBUR NEGATIVE 07/01/2018 0902   BILIRUBINUR NEGATIVE 07/01/2018 0902   KETONESUR 20 (A) 07/01/2018 0902   PROTEINUR NEGATIVE 07/01/2018 0902   UROBILINOGEN 2.0 (H) 04/02/2015 2013   NITRITE NEGATIVE 07/01/2018 0902   LEUKOCYTESUR NEGATIVE 07/01/2018 0902   Sepsis Labs: No results found for this or any previous visit (from the past 240 hour(s)).   Radiological Exams on Admission: Ct Abdomen Pelvis W Contrast  Result  Date: 08/02/2018 CLINICAL DATA:  Right lower quadrant pain EXAM: CT ABDOMEN AND PELVIS WITH CONTRAST TECHNIQUE: Multidetector CT imaging of the abdomen and pelvis was performed using the standard protocol following bolus administration of intravenous contrast. CONTRAST:  151mL ISOVUE-300 IOPAMIDOL (ISOVUE-300) INJECTION 61% COMPARISON:  11/11/2017, CT 02/21/2017, 02/16/2016 FINDINGS: Lower chest: Lung bases demonstrate no acute consolidation or effusion. Stable calcified  granuloma in the posterior right lung base. Stable nodularity in the right middle lobe. Heart size within normal limits. Mild coronary calcification Hepatobiliary: Hepatic steatosis. No calcified gallstone or biliary dilatation Pancreas: Unremarkable. No pancreatic ductal dilatation or surrounding inflammatory changes. Spleen: Normal in size without focal abnormality. Adrenals/Urinary Tract: Adrenal glands are within normal limits. Stable small exophytic lesion upper pole right kidney. No hydronephrosis. The bladder is normal Stomach/Bowel: Stomach is within normal limits. Appendix appears normal. No evidence of bowel wall thickening, distention, or inflammatory changes. Vascular/Lymphatic: Nonaneurysmal aorta. Mild aortic atherosclerosis. No significant adenopathy Reproductive: Clips in the region of the prostate. Other: Negative for free air or free fluid. Musculoskeletal: No acute or suspicious bony abnormality. IMPRESSION: 1. No CT evidence for acute intra-abdominal or pelvic abnormality. Negative for appendicitis. 2. Hepatic steatosis Electronically Signed   By: Donavan Foil M.D.   On: 08/02/2018 20:40    Chest x-ray: Independently reviewed.  Lungs appear to be clear without signs of infiltrate or edema.  Assessment/Plan Alcohol dependence with acute intoxication with unspecified complication: Acute.  Patient probably drinking 1/5 of liquor per day on average.  Initial alcohol level 80.  Patient acutely confused, tremulous, and  tachycardic.  Concern for delirium tremens. - Admit to telemetry bed - Neuro checks  - CIWA protocol with scheduled Ativan - High-dose thiamine  - Social work consult for current substance abuse  Abdominal pain: acute presents with complaints of right-sided abdominal pain, but appears to be more generalized on physical exam. Lactic acid within normal limits and CT imaging negative for any acute cause of symptoms.  Could still possibly represent pancreatitis vs. gastritis vs. PUD.  Received 1 L of normal saline fluids in the ED. - Check urine drug screen/urinalysis - Low-dose morphine overnight as needed for pain - Banana bag and 100 mL/h overnight - Symptoms may warrant further investigation if they persist  Nausea and vomiting: Acute.  No blood noted in emesis. - Antiemetics as needed - Advance diet as tolerated  CAD: Patient with known CAD 60-70% mid RCA with last cardiac cath in 12/2015.  Patient denies any chest pain symptoms.  Last seen in July with elevated troponins up to 0.72.  Symptoms thought to be related to demand ischemia versus NSTEMI.  Cardiology had evaluated, patient had echocardiogram, but no other cardiac testing or invasive intervention was planned. - Continue aspirin, Lipitor, Coreg  Possible aspiration: Patient with rales noted on right lung field.  Wife makes note that patient only coughs when eating and drinking. - Check chest x-ray  Diastolic CHF: Patient appears to be slightly hypovolemic at this time.  Last echo performed on 07/01/2018 revealed LVEF 60 to 65% with grade 1 diastolic dysfunction. - Strict intake and output - Daily weights  Metabolic acidosis with elevated anion gap: Acute.  Suspect secondary to alcohol abuse. - Continue to monitor  Essential hypertension - Continue Coreg - Restart Dyazide when medically appropriate  Anxiety - Continue Wellbutrin, Ativan as needed for anxiety  Transaminitis: Patient with mild elevation of AST to 47 with AST  noted to be 35.  Suspect mild elevation could be related to alcohol use. - Continue to monitor  Polysubstance abuse - Follow-up UDS  BPH - Continue Flomax  Hyperlipidemia - Continue atorvastatin  GERD - Pepcid IV  DVT prophylaxis: lovneox  Code Status: observation Family Communication: Discussed plan of care with the patient and his wife present at bedside Disposition Plan: tbd Consults called:  none   Admission status: observation  Norval Morton MD [facility] Pager [phone]   If 7PM-7AM, please contact night-coverage [email or web address] Password [facility]  08/03/2018, 12:17 AM

## 2018-08-03 NOTE — Consult Note (Signed)
Cardiology Consult    Patient ID: Joshua Henson MRN: [id number], DOB/AGE: 03-03-1946   Admit date: 08/02/2018 Date of Consult: 08/03/2018  Primary Physician: Joshua Cuff, MD Primary Cardiologist: Joshua Moores, MD Requesting Provider: Raliegh Ip. Joshua Link, MD  Patient Profile    Joshua Henson is a 72 y.o. male with a history of etoh abuse, cirrhosis, CVA, HTN, prostate cancer, diast dysfxn, HL, sinus tachycardia, and non-obs CAD, who is being seen today for the evaluation of troponin elevation in the setting of etoh w/d at the request of Dr. Starla Henson.  Past Medical History   Past Medical History:  Diagnosis Date  . Acute hepatic encephalopathy   . Alcohol abuse   . Alcoholic cirrhosis of liver without ascites ([place])   . Anxiety state   . CAD (coronary artery disease), native coronary artery 01/10/2016   a. 12/2015 Cath: LM nl, LAD 89m, LCX small, nl, OM2 20, OM4 mod, nl, LPDA nl, RCA 60/81mid - nondominant-->Med Rx.  . Depression   . Diastolic dysfunction    a. 12/2015 Echo: Ef 55%, Gr1 DD; b. 06/2018 Echo: EF 60-65%, no rwma, Gr1 DD.  Marland Kitchen Fatty liver, alcoholic 0/27/2536  . Hypertension   . Increased ammonia level   . Insomnia   . NSTEMI (non-ST elevated myocardial infarction) ([place])    a. 12/2015 elevated trop in setting of etoh w/d and pna->nonobs dzs on cath->Med Rx; b. 06/2018 Elev trop in setting of etoh w/d->Echo w/ nl EF->Med Rx.  . Prostate cancer ([place]) 2010   External beam radiation (urol - Joshua Henson, XRT Joshua Henson)  . Sinus tachycardia    a. 06/2018 Echo: EF 60-65%.  . Stroke [facility])     Past Surgical History:  Procedure Laterality Date  . CARDIAC CATHETERIZATION N/A 01/15/2016   Procedure: Left Heart Cath and Coronary Angiography;  Surgeon: Joshua Blanks, MD;  Location: [place] [facility];  Service: Cardiovascular;  Laterality: N/A;     Allergies  Allergies  Allergen Reactions  . Lisinopril     syncope  . Celecoxib Rash    History of Present Illness    72 year old  male with a history of alcohol abuse, cirrhosis, CVA, hypertension, prostate cancer, diastolic dysfunction, hyperlipidemia, sinus tachycardia, and nonobstructive CAD.  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  Diagnostic catheterization at that time showed moderate right coronary artery disease though the vessel was nondominant and medical therapy was recommended.  More recently, he was admitted in July of this year with nausea, vomiting, and fatigue.  Troponin rose to 0.72.  We were consulted.  He was not having any chest pain.  Echocardiogram was performed showed normal LV function without regional wall motion abnormalities and medical therapy was recommended.  Since his last admission, he has continued to drink some though he says he does not drink nearly as heavily as he did last year.  Per admission H&P, he has been drinking 1/5 of vodka per day on average.  He was in his usual state of health until August 15, when he was sitting and had sudden onset of recurrent nausea and vomiting.  He presented to the [facility] long emergency department where his alcohol level was 80.  CT of the abdomen and pelvis was without acute abnormalities.  Troponin  was found to be elevated at 0.74 and has since come down to 0.70.  He was admitted by medicine for acute alcohol intoxication and delirium tremens as he was acutely confused, tremulous, and tachycardic.  This morning, he is much more lucid.  ECG shows sinus rhythm with a rate of 79 and no acute ST or T changes.  He denies any recent chest pain or dyspnea.  Inpatient Medications    . aspirin EC  81 mg Oral Daily  . atorvastatin  40 mg Oral q1800  . buPROPion  200 mg Oral Daily  . carvedilol  12.5 mg Oral BID  . enoxaparin  (LOVENOX) injection  40 mg Subcutaneous Q24H  . folic acid  1 mg Oral Daily  . LORazepam  0-4 mg Intravenous Q6H   Followed by  . [START ON 08/05/2018] LORazepam  0-4 mg Intravenous Q12H  . mirtazapine  7.5 mg Oral QHS  . multivitamin with minerals  1 tablet Oral Daily  . ondansetron (ZOFRAN) IV  4 mg Intravenous Once  . QUEtiapine  25 mg Oral QAC breakfast  . sodium chloride flush  3 mL Intravenous Q12H  . tamsulosin  0.4 mg Oral Daily    Family History    Family History  Problem Relation Age of Onset  . Hypertension Mother   . Diabetes Mother   . Esophageal cancer Mother 79  . Prostate cancer Father 52  . Heart disease Maternal Grandfather        MI  . Heart attack Maternal Grandfather   . Prostate cancer Paternal Grandfather    He indicated that his mother is deceased. He indicated that his father is deceased. He indicated that his sister is alive. He indicated that the status of his maternal grandfather is unknown. He indicated that the status of his paternal grandfather is unknown.   Social History    Social History   Socioeconomic History  . Marital status: Divorced    Spouse name: Not on file  . Number of children: 3  . Years of education: 17  . Highest education level: Not on file  Occupational History  . Occupation: Chief Executive Officer  Social Needs  . Financial resource strain: Not on file  . Food insecurity:    Worry: Not on file    Inability: Not on file  . Transportation needs:    Medical: Not on file    Non-medical: Not on file  Tobacco Use  . Smoking status: Current Every Day Smoker    Packs/day: 0.50    Years: 30.00    Pack years: 15.00    Types: Cigarettes  . Smokeless tobacco: Never Used  Substance and Sexual Activity  . Alcohol use: Yes    Alcohol/week: 56.0 standard drinks    Types: 56 Shots of liquor per week    Comment: last drink yesterday  . Drug use: Yes    Types: Marijuana    Comment: smokes intermittently.  Marland Kitchen Sexual activity: Yes    Lifestyle  . Physical activity:    Days per week: Not on file    Minutes per session: Not on file  . Stress: Not on file  Relationships  . Social connections:    Talks on phone: Not on file    Gets together: Not on file    Attends religious service: Not on file    Active member of club or organization: Not on file    Attends meetings of clubs or organizations: Not on file  Relationship status: Not on file  . Intimate partner violence:    Fear of current or ex partner: Not on file    Emotionally abused: Not on file    Physically abused: Not on file    Forced sexual activity: Not on file  Other Topics Concern  . Not on file  Social History Narrative   HSG, [employer], [place]. Married 22 yrs yrs - divorced. Twin boys - 56; 1 dtr - '94. Pension scheme manager. Lives in [place] with long term girlfriend.  Does not routinely exercise.     Review of Systems    General:  No chills, fever, night sweats or weight changes.  Cardiovascular:  No chest pain, dyspnea on exertion, edema, orthopnea, palpitations, paroxysmal nocturnal dyspnea. Dermatological: No rash, lesions/masses Respiratory: No cough, dyspnea Urologic: No hematuria, dysuria Abdominal:   +++ nausea, +++ vomiting, no diarrhea, bright red blood per rectum, melena, or hematemesis Neurologic:  No visual changes, wkns, changes in mental status. All other systems reviewed and are otherwise negative except as noted above.  Physical Exam    Blood pressure (!) 134/91, pulse 85, temperature 98.2 F (36.8 C), temperature source Oral, resp. rate (!) 22, SpO2 96 %.  General: Pleasant, NAD Psych: Normal affect. Neuro: Alert and oriented X 3. Moves all extremities spontaneously. HEENT: Normal  Neck: Supple without bruits or JVD. Lungs:  Resp regular and unlabored, CTA. Heart: RRR no s3, s4, or murmurs. Abdomen: Soft, non-tender, non-distended, BS + x 4.  Extremities: No clubbing, cyanosis or edema. DP/PT/Radials 1+ and  equal bilaterally.  Labs    Recent Labs    08/03/18 0210 08/03/18 0747  TROPONINI 0.74* 0.70*   Lab Results  Component Value Date   WBC 7.6 08/03/2018   HGB 14.5 08/03/2018   HCT 41.1 08/03/2018   MCV 96.9 08/03/2018   PLT 222 08/03/2018    Recent Labs  Lab 08/03/18 0210  NA 143  K 3.9  CL 106  CO2 20*  BUN 22  CREATININE 1.32*  CALCIUM 9.1  PROT 7.4  BILITOT 1.6*  ALKPHOS 67  ALT 32  AST 37  GLUCOSE 96   Lab Results  Component Value Date   CHOL 145 07/01/2018   HDL 66 07/01/2018   LDLCALC 63 07/01/2018   TRIG 79 07/01/2018     Radiology Studies    Ct Abdomen Pelvis W Contrast  Result Date: 08/02/2018 CLINICAL DATA:  Right lower quadrant pain EXAM: CT ABDOMEN AND PELVIS WITH CONTRAST TECHNIQUE: Multidetector CT imaging of the abdomen and pelvis was performed using the standard protocol following bolus administration of intravenous contrast. CONTRAST:  151mL ISOVUE-300 IOPAMIDOL (ISOVUE-300) INJECTION 61% COMPARISON:  11/11/2017, CT 02/21/2017, 02/16/2016 FINDINGS: Lower chest: Lung bases demonstrate no acute consolidation or effusion. Stable calcified granuloma in the posterior right lung base. Stable nodularity in the right middle lobe. Heart size within normal limits. Mild coronary calcification Hepatobiliary: Hepatic steatosis. No calcified gallstone or biliary dilatation Pancreas: Unremarkable. No pancreatic ductal dilatation or surrounding inflammatory changes. Spleen: Normal in size without focal abnormality. Adrenals/Urinary Tract: Adrenal glands are within normal limits. Stable small exophytic lesion upper pole right kidney. No hydronephrosis. The bladder is normal Stomach/Bowel: Stomach is within normal limits. Appendix appears normal. No evidence of bowel wall thickening, distention, or inflammatory changes. Vascular/Lymphatic: Nonaneurysmal aorta. Mild aortic atherosclerosis. No significant adenopathy Reproductive: Clips in the region of the prostate. Other:  Negative for free air or free fluid. Musculoskeletal: No acute or suspicious bony abnormality.  IMPRESSION: 1. No CT evidence for acute intra-abdominal or pelvic abnormality. Negative for appendicitis. 2. Hepatic steatosis Electronically Signed   By: Donavan Foil M.D.   On: 08/02/2018 20:40   Dg Chest Port 1 View  Result Date: 08/03/2018 CLINICAL DATA:  Right lower quadrant pain for several hours EXAM: PORTABLE CHEST 1 VIEW COMPARISON:  06/30/2018 FINDINGS: Cardiac shadow is stable. The lungs are clear bilaterally. No bony abnormality is noted. IMPRESSION: No active disease. Electronically Signed   By: Inez Catalina M.D.   On: 08/03/2018 01:41    ECG & Cardiac Imaging    Regular sinus rhythm, 79, no acute ST or T changes.  Assessment & Plan    1.  Elevated troponin/nonobstructive CAD: Patient with prior history of troponin elevations in the setting of alcoholism and alcohol withdrawal.  Catheterization January 2017 revealed moderate nonobstructive disease with the worst occurring in the mid RCA, which was a nondominant vessel.  Medical therapy was recommended at that time.  He was most recently admitted in July 2019 with nausea, vomiting, and troponin elevation to 0.72.  Echocardiogram at that time showed normal LV function without regional wall motion abnormality's at that time and medical therapy and outpatient follow-up were recommended.  Patient is continued to drink since his last admission and was readmitted yesterday with acute onset of nausea and vomiting.  He says he was not having any abdominal pain, chest pain, or dyspnea.  In the ED, he showed signs of DTs.  CT of the abdomen and pelvis was nonacute.  ECG is normal.  Initial troponin was elevated at 0.74 and is 0.7 this morning.  No events on telemetry.  We will follow-up echo to re-eval for wall motion abnormalities.  In the absence of symptoms, provided that echo is normal, would likely plan on continued medical therapy and could consider  outpatient stress testing.  Continue aspirin, statin, beta-blocker.  2.  Nausea and vomiting: In the setting of alcohol intoxication.  Currently stable.  3.  Alcohol abuse: Alcohol level 80.  Lucid this morning.  CIWA protocol per medicine.  4.  Essential hypertension: Blood pressure mildly elevated this morning.  Follow-up after a.m. Meds.  5.  Hyperlipidemia: LDL 63.  LFTs within normal limits this morning.  Continue statin therapy.  6.  CKD III: creat up sl above baseline.  Receiving IVF.  Signed, Murray Hodgkins, NP 08/03/2018, 9:22 AM  For questions or updates, please contact   Please consult [email or web address] for contact info under Cardiology/STEMI.

## 2018-08-04 DIAGNOSIS — F419 Anxiety disorder, unspecified: Secondary | ICD-10-CM

## 2018-08-04 DIAGNOSIS — R109 Unspecified abdominal pain: Secondary | ICD-10-CM

## 2018-08-04 DIAGNOSIS — R112 Nausea with vomiting, unspecified: Secondary | ICD-10-CM

## 2018-08-04 DIAGNOSIS — I2510 Atherosclerotic heart disease of native coronary artery without angina pectoris: Secondary | ICD-10-CM | POA: Diagnosis not present

## 2018-08-04 DIAGNOSIS — F10229 Alcohol dependence with intoxication, unspecified: Secondary | ICD-10-CM | POA: Diagnosis not present

## 2018-08-04 LAB — MAGNESIUM: Magnesium: 1.7 mg/dL (ref 1.7–2.4)

## 2018-08-04 LAB — CBC WITH DIFFERENTIAL/PLATELET
BASOS ABS: 0 10*3/uL (ref 0.0–0.1)
Basophils Relative: 0 %
EOS ABS: 0.1 10*3/uL (ref 0.0–0.7)
Eosinophils Relative: 2 %
HCT: 38.7 % — ABNORMAL LOW (ref 39.0–52.0)
HEMOGLOBIN: 13.4 g/dL (ref 13.0–17.0)
LYMPHS ABS: 1.4 10*3/uL (ref 0.7–4.0)
Lymphocytes Relative: 21 %
MCH: 34.2 pg — AB (ref 26.0–34.0)
MCHC: 34.6 g/dL (ref 30.0–36.0)
MCV: 98.7 fL (ref 78.0–100.0)
Monocytes Absolute: 0.8 10*3/uL (ref 0.1–1.0)
Monocytes Relative: 12 %
NEUTROS PCT: 65 %
Neutro Abs: 4.3 10*3/uL (ref 1.7–7.7)
Platelets: 178 10*3/uL (ref 150–400)
RBC: 3.92 MIL/uL — AB (ref 4.22–5.81)
RDW: 14.9 % (ref 11.5–15.5)
WBC: 6.6 10*3/uL (ref 4.0–10.5)

## 2018-08-04 LAB — COMPREHENSIVE METABOLIC PANEL
ALBUMIN: 3.6 g/dL (ref 3.5–5.0)
ALK PHOS: 52 U/L (ref 38–126)
ALT: 21 U/L (ref 0–44)
AST: 24 U/L (ref 15–41)
Anion gap: 11 (ref 5–15)
BUN: 16 mg/dL (ref 8–23)
CALCIUM: 8.4 mg/dL — AB (ref 8.9–10.3)
CHLORIDE: 106 mmol/L (ref 98–111)
CO2: 20 mmol/L — AB (ref 22–32)
CREATININE: 1.08 mg/dL (ref 0.61–1.24)
GFR calc non Af Amer: >60 mL/min (ref >60)
GLUCOSE: 108 mg/dL — AB (ref 70–99)
Potassium: 3.1 mmol/L — ABNORMAL LOW (ref 3.5–5.1)
SODIUM: 137 mmol/L (ref 135–145)
Total Bilirubin: 1.4 mg/dL — ABNORMAL HIGH (ref 0.3–1.2)
Total Protein: 6.3 g/dL — ABNORMAL LOW (ref 6.5–8.1)

## 2018-08-04 MED ORDER — ONDANSETRON HCL 4 MG PO TABS: 4.0000 mg | ORAL_TABLET | Freq: Four times a day (QID) | ORAL | 0 refills | Status: DC | PRN

## 2018-08-04 MED ORDER — POTASSIUM CHLORIDE CRYS ER 20 MEQ PO TBCR
40.0000 meq | EXTENDED_RELEASE_TABLET | ORAL | Status: DC
  Administered 2018-08-04: 40 meq via ORAL
  Filled 2018-08-04 (×2): qty 2

## 2018-08-04 MED ORDER — ADULT MULTIVITAMIN W/MINERALS CH: 1.0000 | ORAL_TABLET | Freq: Every day | ORAL | Status: DC

## 2018-08-04 MED ORDER — LORAZEPAM 1 MG PO TABS: 1.0000 mg | ORAL_TABLET | Freq: Two times a day (BID) | ORAL | 0 refills | Status: DC | PRN

## 2018-08-04 MED ORDER — POTASSIUM CHLORIDE CRYS ER 20 MEQ PO TBCR: 20.0000 meq | EXTENDED_RELEASE_TABLET | Freq: Every day | ORAL | Status: DC

## 2018-08-04 MED ORDER — MIRTAZAPINE 7.5 MG PO TABS: ORAL_TABLET | ORAL | 0 refills | Status: DC

## 2018-08-04 MED ORDER — PANTOPRAZOLE SODIUM 40 MG PO TBEC: 40.0000 mg | DELAYED_RELEASE_TABLET | Freq: Every day | ORAL | 0 refills | Status: DC

## 2018-08-04 NOTE — Progress Notes (Signed)
Joshua Henson to be D/C'd Home per MD order.  Discussed prescriptions and follow up appointments with the patient. Prescriptions given to patient, medication list explained in detail. Pt verbalized understanding.  Allergies as of 08/04/2018      Reactions   Lisinopril    syncope   Celecoxib Rash      Medication List    TAKE these medications   aspirin 81 MG EC tablet Take 1 tablet (81 mg total) by mouth daily.   atorvastatin 40 MG tablet Commonly known as:  LIPITOR Take 1 tablet (40 mg total) by mouth daily at 6 PM.   buPROPion 200 MG 12 hr tablet Commonly known as:  WELLBUTRIN SR Take 200 mg by mouth daily.   carvedilol 12.5 MG tablet Commonly known as:  COREG Take 1 tablet (12.5 mg total) by mouth 2 (two) times daily.   cholecalciferol 1000 units tablet Commonly known as:  VITAMIN D Take 1,000 Units by mouth 2 (two) times daily.   Co Q-10 200 MG Caps Take 200 mg by mouth daily.   Fish Oil 1000 MG Caps Take 1,000 mg by mouth 2 (two) times daily.   folic acid 1 MG tablet Commonly known as:  FOLVITE take 1 tablet by mouth once daily   LORazepam 1 MG tablet Commonly known as:  ATIVAN Take 1 tablet (1 mg total) by mouth 2 (two) times daily as needed for anxiety.   Magnesium 500 MG Caps Take 500 mg by mouth at bedtime.   mirtazapine 7.5 MG tablet Commonly known as:  REMERON take 15mg  by mouth at bedtime What changed:    how much to take  how to take this  when to take this  additional instructions   multivitamin with minerals Tabs tablet Take 1 tablet by mouth daily.   nitroGLYCERIN 0.4 MG SL tablet Commonly known as:  NITROSTAT Place 1 tablet (0.4 mg total) under the tongue every 5 (five) minutes as needed for chest pain.   ondansetron 4 MG tablet Commonly known as:  ZOFRAN Take 1 tablet (4 mg total) by mouth every 6 (six) hours as needed for nausea.   pantoprazole 40 MG tablet Commonly known as:  PROTONIX Take 1 tablet (40 mg total) by mouth  daily.   polyethylene glycol packet Commonly known as:  MIRALAX / GLYCOLAX Take 17 g by mouth daily.   potassium chloride SA 20 MEQ tablet Commonly known as:  K-DUR,KLOR-CON Take 1 tablet (20 mEq total) by mouth daily.   QUEtiapine 25 MG tablet Commonly known as:  SEROQUEL Take 1 tablet (25 mg total) by mouth daily before breakfast.   tamsulosin 0.4 MG Caps capsule Commonly known as:  FLOMAX Take 1 capsule (0.4 mg total) by mouth daily.   thiamine 100 MG tablet Commonly known as:  VITAMIN B-1 Take 100 mg by mouth daily.   triamterene-hydrochlorothiazide 37.5-25 MG capsule Commonly known as:  DYAZIDE Take 1 capsule by mouth daily.       Vitals:   08/03/18 2048 08/04/18 0627  BP: 113/80 133/74  Pulse: 77 69  Resp: 18 20  Temp: 98.5 F (36.9 C) 98.9 F (37.2 C)  SpO2: 96% 92%    Skin clean, dry and intact without evidence of skin break down, no evidence of skin tears noted. IV catheter discontinued intact. Site without signs and symptoms of complications. Dressing and pressure applied. Pt denies pain at this time. No complaints noted.  An After Visit Summary was printed and given to the  patient. Patient escorted via [place], and D/C home via private auto.  Haywood Lasso BSN, RN [employer] Phone [phone]

## 2018-08-04 NOTE — Discharge Summary (Addendum)
Physician Discharge Summary  Joshua Henson LEX:[id number] DOB: 03/01/1946 DOA: 08/02/2018  PCP: Clovia Cuff, MD  Admit date: 08/02/2018 Discharge date: 08/04/2018  Admitted From: Home Disposition:  Home  Recommendations for Outpatient Follow-up:  1. Follow up with PCP in 1 weeks 2. Follow-up with cardiology as an outpatient 3. Patient will benefit from outpatient evaluation and follow-up by a psychiatrist   Home Health: No Equipment/Devices: None  Discharge Condition: Stable CODE STATUS: Full Diet recommendation: Heart Healthy  Brief/Interim Summary: 72 year old male with history of alcohol abuse, CAD, CVA and prostate cancer presented with right-sided abdominal pain.  CT of the abdomen and pelvis revealed no acute abnormality.  Patient was admitted with abdominal pain and alcohol intoxication.  Troponin was found to be slightly positive at 0.74.  Cardiology was consulted and recommended medical management.  Patient's condition improved, he has not vomited currently and is tolerating diet.  He will be discharged home.  Discharge Diagnoses:  Principal Problem:   Alcohol dependence with acute alcoholic intoxication, with unspecified complication ([place]) Active Problems:   CAD (coronary artery disease)   Essential hypertension   Nausea and vomiting   Abdominal pain   Chronic diastolic CHF (congestive heart failure) (HCC)   Anxiety  Abdominal pain with nausea and vomiting -Most likely secondary to alcohol induced gastritis versus peptic ulcer disease -Improved.  Tolerating diet -Discharge home on oral Protonix.  Outpatient follow-up with PCP and may need GI evaluation as an outpatient -CT scan of the abdomen was unremarkable except for hepatic steatosis  Alcohol dependence with intoxication -Patient currently is much awake and alert with no signs of withdrawal.  Continue thiamine, multivitamin and folate.  Follow-up with social worker regarding resources as outpatient.  Counseled  about alcohol abstinence  Coronary artery disease with mildly positive troponins -Troponins did not significantly trend up.  No chest pain.  Cardiology evaluation appreciated; continue medical management.  No further cardiac work-up needed at this time.  Continue aspirin, Lipitor, Coreg.  Outpatient follow-up with cardiology. echo showed EF of 60 to 65% with normal RV size and systolic function.  Essential hypertension -Continue home regimen  Metabolic acidosis -Secondary to alcohol abuse.  Improving  Anxiety -Continue home regimen.  Outpatient follow-up with psychiatry  BPH -Continue Flomax  Mild transaminitis -Secondary to alcohol abuse.  Outpatient follow-up.  Counseled on alcohol abstinence  Hyperlipidemia -Continue statin  Hypokalemia -Replace.  Outpatient follow-up    Discharge Instructions  Discharge Instructions    Ambulatory referral to Cardiology   Complete by:  As directed    Ambulatory referral to Psychiatry   Complete by:  As directed    Depression/alcohol abuse   Call MD for:  difficulty breathing, headache or visual disturbances   Complete by:  As directed    Call MD for:  extreme fatigue   Complete by:  As directed    Call MD for:  hives   Complete by:  As directed    Call MD for:  persistant dizziness or light-headedness   Complete by:  As directed    Call MD for:  persistant nausea and vomiting   Complete by:  As directed    Call MD for:  severe uncontrolled pain   Complete by:  As directed    Call MD for:  temperature >100.4   Complete by:  As directed    Diet - low sodium heart healthy   Complete by:  As directed    Increase activity slowly   Complete by:  As  directed      Allergies as of 08/04/2018      Reactions   Lisinopril    syncope   Celecoxib Rash      Medication List    TAKE these medications   aspirin 81 MG EC tablet Take 1 tablet (81 mg total) by mouth daily.   atorvastatin 40 MG tablet Commonly known as:   LIPITOR Take 1 tablet (40 mg total) by mouth daily at 6 PM.   buPROPion 200 MG 12 hr tablet Commonly known as:  WELLBUTRIN SR Take 200 mg by mouth daily.   carvedilol 12.5 MG tablet Commonly known as:  COREG Take 1 tablet (12.5 mg total) by mouth 2 (two) times daily.   cholecalciferol 1000 units tablet Commonly known as:  VITAMIN D Take 1,000 Units by mouth 2 (two) times daily.   Co Q-10 200 MG Caps Take 200 mg by mouth daily.   Fish Oil 1000 MG Caps Take 1,000 mg by mouth 2 (two) times daily.   folic acid 1 MG tablet Commonly known as:  FOLVITE take 1 tablet by mouth once daily   LORazepam 1 MG tablet Commonly known as:  ATIVAN Take 1 tablet (1 mg total) by mouth 2 (two) times daily as needed for anxiety.   Magnesium 500 MG Caps Take 500 mg by mouth at bedtime.   mirtazapine 7.5 MG tablet Commonly known as:  REMERON take 15mg  by mouth at bedtime What changed:    how much to take  how to take this  when to take this  additional instructions   multivitamin with minerals Tabs tablet Take 1 tablet by mouth daily.   nitroGLYCERIN 0.4 MG SL tablet Commonly known as:  NITROSTAT Place 1 tablet (0.4 mg total) under the tongue every 5 (five) minutes as needed for chest pain.   ondansetron 4 MG tablet Commonly known as:  ZOFRAN Take 1 tablet (4 mg total) by mouth every 6 (six) hours as needed for nausea.   pantoprazole 40 MG tablet Commonly known as:  PROTONIX Take 1 tablet (40 mg total) by mouth daily.   polyethylene glycol packet Commonly known as:  MIRALAX / GLYCOLAX Take 17 g by mouth daily.   potassium chloride SA 20 MEQ tablet Commonly known as:  K-DUR,KLOR-CON Take 1 tablet (20 mEq total) by mouth daily.   QUEtiapine 25 MG tablet Commonly known as:  SEROQUEL Take 1 tablet (25 mg total) by mouth daily before breakfast.   tamsulosin 0.4 MG Caps capsule Commonly known as:  FLOMAX Take 1 capsule (0.4 mg total) by mouth daily.   thiamine 100 MG  tablet Commonly known as:  VITAMIN B-1 Take 100 mg by mouth daily.   triamterene-hydrochlorothiazide 37.5-25 MG capsule Commonly known as:  DYAZIDE Take 1 capsule by mouth daily.       Follow-up Information    Clovia Cuff, MD. Call in 3 days.   Specialty:  Internal Medicine Contact information: [address] [place] [address] [phone]        [facility] DEPT.   Specialty:  Emergency Medicine Why:  as needed, if symptoms worsen Contact information: [place] [id number] [place] [phone]       Psychiatrist. Schedule an appointment as soon as possible for a visit in 1 week(s).          Allergies  Allergen Reactions  . Lisinopril     syncope  . Celecoxib Rash    Consultations:  Cardiology   Procedures/Studies: Ct Abdomen Pelvis W Contrast  Result Date: 08/02/2018 CLINICAL DATA:  Right lower quadrant pain EXAM: CT ABDOMEN AND PELVIS WITH CONTRAST TECHNIQUE: Multidetector CT imaging of the abdomen and pelvis was performed using the standard protocol following bolus administration of intravenous contrast. CONTRAST:  12mL ISOVUE-300 IOPAMIDOL (ISOVUE-300) INJECTION 61% COMPARISON:  11/11/2017, CT 02/21/2017, 02/16/2016 FINDINGS: Lower chest: Lung bases demonstrate no acute consolidation or effusion. Stable calcified granuloma in the posterior right lung base. Stable nodularity in the right middle lobe. Heart size within normal limits. Mild coronary calcification Hepatobiliary: Hepatic steatosis. No calcified gallstone or biliary dilatation Pancreas: Unremarkable. No pancreatic ductal dilatation or surrounding inflammatory changes. Spleen: Normal in size without focal abnormality. Adrenals/Urinary Tract: Adrenal glands are within normal limits. Stable small exophytic lesion upper pole right kidney. No hydronephrosis. The bladder is normal Stomach/Bowel: Stomach is within normal limits.  Appendix appears normal. No evidence of bowel wall thickening, distention, or inflammatory changes. Vascular/Lymphatic: Nonaneurysmal aorta. Mild aortic atherosclerosis. No significant adenopathy Reproductive: Clips in the region of the prostate. Other: Negative for free air or free fluid. Musculoskeletal: No acute or suspicious bony abnormality. IMPRESSION: 1. No CT evidence for acute intra-abdominal or pelvic abnormality. Negative for appendicitis. 2. Hepatic steatosis Electronically Signed   By: Donavan Foil M.D.   On: 08/02/2018 20:40   Dg Chest Port 1 View  Result Date: 08/03/2018 CLINICAL DATA:  Right lower quadrant pain for several hours EXAM: PORTABLE CHEST 1 VIEW COMPARISON:  06/30/2018 FINDINGS: Cardiac shadow is stable. The lungs are clear bilaterally. No bony abnormality is noted. IMPRESSION: No active disease. Electronically Signed   By: Inez Catalina M.D.   On: 08/03/2018 01:41    Echo on 08/03/2018 Left ventricle: The cavity size was normal. Wall thickness was   normal. Systolic function was normal. The estimated ejection   fraction was in the range of 60% to 65%. Although no diagnostic   regional wall motion abnormality was identified, this possibility   cannot be completely excluded on the basis of this study.   Diastolic function was not assessed. - Aortic valve: There was no stenosis. - Aorta: Mildly dilated aortic root. Aortic root dimension: 37 mm   (ED). - Mitral valve: Mildly calcified annulus. There was no significant   regurgitation. - Right ventricle: The cavity size was normal. Systolic function   was normal. - Pulmonary arteries: No complete TR doppler jet so unable to   estimate PA systolic pressure. - Systemic veins: IVC not visualized.  Impressions:  - Normal LV size with EF 60-65%. Normal RV size and systolic   function. No significant valvular abnormalities.   Subjective: Patient seen and examined at bedside.  He is hard of hearing but denies current  chest pain, abdominal pain, nausea or vomiting.  He wants to go home.  Discharge Exam: Vitals:   08/03/18 2048 08/04/18 0627  BP: 113/80 133/74  Pulse: 77 69  Resp: 18 20  Temp: 98.5 F (36.9 C) 98.9 F (37.2 C)  SpO2: 96% 92%   Vitals:   08/03/18 0403 08/03/18 1529 08/03/18 2048 08/04/18 0627  BP: (!) 134/91 119/75 113/80 133/74  Pulse: 85 72 77 69  Resp: (!) 22 12 18 20   Temp: 98.2 F (36.8 C) 98.5 F (36.9 C) 98.5 F (36.9 C) 98.9 F (37.2 C)  TempSrc: Oral Oral Oral Oral  SpO2: 96% 98% 96% 92%    General: Pt is alert, awake, not in acute distress, hard of hearing  cardiovascular:  rate controlled, S1/S2 + Respiratory: bilateral decreased breath sounds at bases Abdominal: Soft, NT, ND, bowel sounds + Extremities: no edema, no cyanosis    The results of significant diagnostics from this hospitalization (including imaging, microbiology, ancillary and laboratory) are listed below for reference.     Microbiology: No results found for this or any previous visit (from the past 240 hour(s)).   Labs: BNP (last 3 results) No results for input(s): BNP in the last 8760 hours. Basic Metabolic Panel: Recent Labs  Lab 08/02/18 1721 08/03/18 0210 08/04/18 0536  NA 142 143 137  K 4.1 3.9 3.1*  CL 105 106 106  CO2 19* 20* 20*  GLUCOSE 78 96 108*  BUN 21 22 16   CREATININE 1.19 1.32* 1.08  CALCIUM 9.5 9.1 8.4*  MG  --  1.8 1.7   Liver Function Tests: Recent Labs  Lab 08/02/18 1721 08/03/18 0210 08/04/18 0536  AST 47* 37 24  ALT 35 32 21  ALKPHOS 67 67 52  BILITOT 1.0 1.6* 1.4*  PROT 7.5 7.4 6.3*  ALBUMIN 4.1 4.1 3.6   Recent Labs  Lab 08/02/18 1721  LIPASE 28   Recent Labs  Lab 08/03/18 0210  AMMONIA 14   CBC: Recent Labs  Lab 08/02/18 1721 08/03/18 0210 08/04/18 0536  WBC 6.3 7.6 6.6  NEUTROABS  --  5.6 4.3  HGB 15.4 14.5 13.4  HCT 40.9 41.1 38.7*  MCV 99.0 96.9 98.7  PLT 259 222 178   Cardiac Enzymes: Recent Labs  Lab 08/03/18 0210  08/03/18 0747 08/03/18 1351  TROPONINI 0.74* 0.70* 0.55*   BNP: Invalid input(s): POCBNP CBG: No results for input(s): GLUCAP in the last 168 hours. D-Dimer No results for input(s): DDIMER in the last 72 hours. Hgb A1c No results for input(s): HGBA1C in the last 72 hours. Lipid Profile No results for input(s): CHOL, HDL, LDLCALC, TRIG, CHOLHDL, LDLDIRECT in the last 72 hours. Thyroid function studies No results for input(s): TSH, T4TOTAL, T3FREE, THYROIDAB in the last 72 hours.  Invalid input(s): FREET3 Anemia work up No results for input(s): VITAMINB12, FOLATE, FERRITIN, TIBC, IRON, RETICCTPCT in the last 72 hours. Urinalysis    Component Value Date/Time   COLORURINE YELLOW 08/03/2018 1133   APPEARANCEUR CLEAR 08/03/2018 1133   LABSPEC 1.035 (H) 08/03/2018 1133   PHURINE 5.0 08/03/2018 1133   GLUCOSEU NEGATIVE 08/03/2018 1133   HGBUR NEGATIVE 08/03/2018 1133   BILIRUBINUR NEGATIVE 08/03/2018 1133   KETONESUR 20 (A) 08/03/2018 1133   PROTEINUR NEGATIVE 08/03/2018 1133   UROBILINOGEN 2.0 (H) 04/02/2015 2013   NITRITE NEGATIVE 08/03/2018 1133   LEUKOCYTESUR NEGATIVE 08/03/2018 1133   Sepsis Labs Invalid input(s): PROCALCITONIN,  WBC,  LACTICIDVEN Microbiology No results found for this or any previous visit (from the past 240 hour(s)).   Time coordinating discharge: 35 minutes  SIGNED:   Aline August, MD  [facility] 08/04/2018, 9:17 AM Pager: [phone]  If 7PM-7AM, please contact night-coverage [email or web address] Password [email or web address]

## 2018-08-11 DIAGNOSIS — F419 Anxiety disorder, unspecified: Secondary | ICD-10-CM | POA: Diagnosis not present

## 2018-08-11 DIAGNOSIS — M545 Low back pain: Secondary | ICD-10-CM | POA: Diagnosis not present

## 2018-08-11 DIAGNOSIS — F341 Dysthymic disorder: Secondary | ICD-10-CM | POA: Diagnosis not present

## 2018-08-11 DIAGNOSIS — I10 Essential (primary) hypertension: Secondary | ICD-10-CM | POA: Diagnosis not present

## 2018-09-24 ENCOUNTER — Encounter (HOSPITAL_COMMUNITY): Payer: Self-pay

## 2018-09-24 ENCOUNTER — Emergency Department (HOSPITAL_COMMUNITY): Payer: Medicare Other

## 2018-09-24 ENCOUNTER — Inpatient Hospital Stay (HOSPITAL_COMMUNITY)
Admission: EM | Admit: 2018-09-24 | Discharge: 2018-09-29 | DRG: 897 | Disposition: A | Discharge status: Skilled Nursing Facility | Payer: Medicare Other | Attending: Internal Medicine | Admitting: Internal Medicine

## 2018-09-24 ENCOUNTER — Observation Stay (HOSPITAL_COMMUNITY): Payer: Medicare Other

## 2018-09-24 ENCOUNTER — Other Ambulatory Visit: Payer: Self-pay

## 2018-09-24 DIAGNOSIS — Z888 Allergy status to other drugs, medicaments and biological substances status: Secondary | ICD-10-CM

## 2018-09-24 DIAGNOSIS — Z720 Tobacco use: Secondary | ICD-10-CM | POA: Diagnosis present

## 2018-09-24 DIAGNOSIS — F1010 Alcohol abuse, uncomplicated: Secondary | ICD-10-CM | POA: Diagnosis present

## 2018-09-24 DIAGNOSIS — I110 Hypertensive heart disease with heart failure: Secondary | ICD-10-CM | POA: Diagnosis present

## 2018-09-24 DIAGNOSIS — Z9181 History of falling: Secondary | ICD-10-CM

## 2018-09-24 DIAGNOSIS — M545 Low back pain, unspecified: Secondary | ICD-10-CM

## 2018-09-24 DIAGNOSIS — G8929 Other chronic pain: Secondary | ICD-10-CM | POA: Diagnosis present

## 2018-09-24 DIAGNOSIS — R945 Abnormal results of liver function studies: Secondary | ICD-10-CM | POA: Diagnosis present

## 2018-09-24 DIAGNOSIS — F411 Generalized anxiety disorder: Secondary | ICD-10-CM | POA: Diagnosis present

## 2018-09-24 DIAGNOSIS — W19XXXA Unspecified fall, initial encounter: Secondary | ICD-10-CM | POA: Diagnosis not present

## 2018-09-24 DIAGNOSIS — F10939 Alcohol use, unspecified with withdrawal, unspecified: Secondary | ICD-10-CM | POA: Diagnosis present

## 2018-09-24 DIAGNOSIS — I5032 Chronic diastolic (congestive) heart failure: Secondary | ICD-10-CM | POA: Diagnosis present

## 2018-09-24 DIAGNOSIS — M62830 Muscle spasm of back: Secondary | ICD-10-CM | POA: Diagnosis present

## 2018-09-24 DIAGNOSIS — F10931 Alcohol use, unspecified with withdrawal delirium: Secondary | ICD-10-CM | POA: Diagnosis present

## 2018-09-24 DIAGNOSIS — I2510 Atherosclerotic heart disease of native coronary artery without angina pectoris: Secondary | ICD-10-CM | POA: Diagnosis present

## 2018-09-24 DIAGNOSIS — F10231 Alcohol dependence with withdrawal delirium: Secondary | ICD-10-CM | POA: Diagnosis not present

## 2018-09-24 DIAGNOSIS — R778 Other specified abnormalities of plasma proteins: Secondary | ICD-10-CM

## 2018-09-24 DIAGNOSIS — F10239 Alcohol dependence with withdrawal, unspecified: Secondary | ICD-10-CM | POA: Diagnosis present

## 2018-09-24 DIAGNOSIS — F10930 Alcohol use, unspecified with withdrawal, uncomplicated: Secondary | ICD-10-CM

## 2018-09-24 DIAGNOSIS — I10 Essential (primary) hypertension: Secondary | ICD-10-CM | POA: Diagnosis present

## 2018-09-24 DIAGNOSIS — K7030 Alcoholic cirrhosis of liver without ascites: Secondary | ICD-10-CM | POA: Diagnosis present

## 2018-09-24 DIAGNOSIS — Z79899 Other long term (current) drug therapy: Secondary | ICD-10-CM

## 2018-09-24 DIAGNOSIS — Z7982 Long term (current) use of aspirin: Secondary | ICD-10-CM

## 2018-09-24 DIAGNOSIS — F17210 Nicotine dependence, cigarettes, uncomplicated: Secondary | ICD-10-CM | POA: Diagnosis present

## 2018-09-24 DIAGNOSIS — R0602 Shortness of breath: Secondary | ICD-10-CM

## 2018-09-24 DIAGNOSIS — Z8546 Personal history of malignant neoplasm of prostate: Secondary | ICD-10-CM

## 2018-09-24 DIAGNOSIS — I248 Other forms of acute ischemic heart disease: Secondary | ICD-10-CM | POA: Diagnosis present

## 2018-09-24 DIAGNOSIS — R2681 Unsteadiness on feet: Secondary | ICD-10-CM | POA: Diagnosis not present

## 2018-09-24 DIAGNOSIS — R001 Bradycardia, unspecified: Secondary | ICD-10-CM | POA: Diagnosis not present

## 2018-09-24 DIAGNOSIS — I252 Old myocardial infarction: Secondary | ICD-10-CM

## 2018-09-24 DIAGNOSIS — R260 Ataxic gait: Secondary | ICD-10-CM | POA: Diagnosis not present

## 2018-09-24 DIAGNOSIS — Y904 Blood alcohol level of 80-99 mg/100 ml: Secondary | ICD-10-CM | POA: Diagnosis present

## 2018-09-24 DIAGNOSIS — R112 Nausea with vomiting, unspecified: Secondary | ICD-10-CM | POA: Diagnosis present

## 2018-09-24 DIAGNOSIS — E876 Hypokalemia: Secondary | ICD-10-CM | POA: Diagnosis not present

## 2018-09-24 DIAGNOSIS — Z8673 Personal history of transient ischemic attack (TIA), and cerebral infarction without residual deficits: Secondary | ICD-10-CM

## 2018-09-24 DIAGNOSIS — R7989 Other specified abnormal findings of blood chemistry: Secondary | ICD-10-CM | POA: Diagnosis present

## 2018-09-24 DIAGNOSIS — F10230 Alcohol dependence with withdrawal, uncomplicated: Secondary | ICD-10-CM

## 2018-09-24 DIAGNOSIS — G4700 Insomnia, unspecified: Secondary | ICD-10-CM | POA: Diagnosis present

## 2018-09-24 DIAGNOSIS — Z923 Personal history of irradiation: Secondary | ICD-10-CM

## 2018-09-24 DIAGNOSIS — Z886 Allergy status to analgesic agent status: Secondary | ICD-10-CM

## 2018-09-24 DIAGNOSIS — F329 Major depressive disorder, single episode, unspecified: Secondary | ICD-10-CM | POA: Diagnosis present

## 2018-09-24 DIAGNOSIS — K700 Alcoholic fatty liver: Secondary | ICD-10-CM | POA: Diagnosis present

## 2018-09-24 LAB — CBC WITH DIFFERENTIAL/PLATELET
BASOS ABS: 0 10*3/uL (ref 0.0–0.1)
Basophils Relative: 1 %
EOS ABS: 0 10*3/uL (ref 0.0–0.7)
EOS PCT: 0 %
HCT: 44.8 % (ref 39.0–52.0)
Hemoglobin: 15.4 g/dL (ref 13.0–17.0)
Lymphocytes Relative: 13 %
Lymphs Abs: 0.7 10*3/uL (ref 0.7–4.0)
MCH: 34.3 pg — AB (ref 26.0–34.0)
MCHC: 34.4 g/dL (ref 30.0–36.0)
MCV: 99.8 fL (ref 78.0–100.0)
Monocytes Absolute: 0.4 10*3/uL (ref 0.1–1.0)
Monocytes Relative: 7 %
Neutro Abs: 4.2 10*3/uL (ref 1.7–7.7)
Neutrophils Relative %: 79 %
PLATELETS: 247 10*3/uL (ref 150–400)
RBC: 4.49 MIL/uL (ref 4.22–5.81)
RDW: 14.8 % (ref 11.5–15.5)
WBC: 5.3 10*3/uL (ref 4.0–10.5)

## 2018-09-24 LAB — COMPREHENSIVE METABOLIC PANEL
ALT: 58 U/L — AB (ref 0–44)
AST: 117 U/L — AB (ref 15–41)
Albumin: 4.3 g/dL (ref 3.5–5.0)
Alkaline Phosphatase: 77 U/L (ref 38–126)
Anion gap: 22 — ABNORMAL HIGH (ref 5–15)
BUN: 18 mg/dL (ref 8–23)
CHLORIDE: 103 mmol/L (ref 98–111)
CO2: 18 mmol/L — AB (ref 22–32)
CREATININE: 1.22 mg/dL (ref 0.61–1.24)
Calcium: 9.1 mg/dL (ref 8.9–10.3)
GFR calc non Af Amer: 58 mL/min — ABNORMAL LOW (ref >60)
Glucose, Bld: 111 mg/dL — ABNORMAL HIGH (ref 70–99)
POTASSIUM: 4.1 mmol/L (ref 3.5–5.1)
SODIUM: 143 mmol/L (ref 135–145)
Total Bilirubin: 2.2 mg/dL — ABNORMAL HIGH (ref 0.3–1.2)
Total Protein: 7.9 g/dL (ref 6.5–8.1)

## 2018-09-24 LAB — TROPONIN I: Troponin I: 0.69 ng/mL (ref <0.03)

## 2018-09-24 MED ORDER — TAMSULOSIN HCL 0.4 MG PO CAPS
0.4000 mg | ORAL_CAPSULE | Freq: Every day | ORAL | Status: DC
  Administered 2018-09-24 – 2018-09-29 (×6): 0.4 mg via ORAL
  Filled 2018-09-24 (×6): qty 1

## 2018-09-24 MED ORDER — QUETIAPINE FUMARATE 25 MG PO TABS
25.0000 mg | ORAL_TABLET | Freq: Every day | ORAL | Status: DC
  Administered 2018-09-25 – 2018-09-29 (×5): 25 mg via ORAL
  Filled 2018-09-24 (×5): qty 1

## 2018-09-24 MED ORDER — POLYETHYLENE GLYCOL 3350 17 G PO PACK
17.0000 g | PACK | Freq: Every day | ORAL | Status: DC
  Administered 2018-09-27 – 2018-09-28 (×2): 17 g via ORAL
  Filled 2018-09-24 (×5): qty 1

## 2018-09-24 MED ORDER — TRAMADOL HCL 50 MG PO TABS
50.0000 mg | ORAL_TABLET | Freq: Four times a day (QID) | ORAL | Status: DC | PRN
  Administered 2018-09-24 – 2018-09-29 (×11): 50 mg via ORAL
  Filled 2018-09-24 (×11): qty 1

## 2018-09-24 MED ORDER — LORAZEPAM 2 MG/ML IJ SOLN: 0.0000 mg | Freq: Two times a day (BID) | INTRAMUSCULAR | Status: DC

## 2018-09-24 MED ORDER — VITAMIN B-1 100 MG PO TABS
100.0000 mg | ORAL_TABLET | Freq: Every day | ORAL | Status: DC
  Administered 2018-09-25 – 2018-09-29 (×5): 100 mg via ORAL
  Filled 2018-09-24 (×5): qty 1

## 2018-09-24 MED ORDER — ADULT MULTIVITAMIN W/MINERALS CH
1.0000 | ORAL_TABLET | Freq: Every day | ORAL | Status: DC
  Administered 2018-09-25 – 2018-09-29 (×5): 1 via ORAL
  Filled 2018-09-24 (×5): qty 1

## 2018-09-24 MED ORDER — ENOXAPARIN SODIUM 40 MG/0.4ML ~~LOC~~ SOLN
40.0000 mg | SUBCUTANEOUS | Status: DC
  Administered 2018-09-24 – 2018-09-28 (×5): 40 mg via SUBCUTANEOUS
  Filled 2018-09-24 (×5): qty 0.4

## 2018-09-24 MED ORDER — CARVEDILOL 12.5 MG PO TABS
12.5000 mg | ORAL_TABLET | Freq: Two times a day (BID) | ORAL | Status: DC
  Administered 2018-09-24 – 2018-09-25 (×4): 12.5 mg via ORAL
  Filled 2018-09-24 (×4): qty 1

## 2018-09-24 MED ORDER — ACETAMINOPHEN 650 MG RE SUPP: 650.0000 mg | Freq: Four times a day (QID) | RECTAL | Status: DC | PRN

## 2018-09-24 MED ORDER — LORAZEPAM 2 MG/ML IJ SOLN
0.0000 mg | Freq: Four times a day (QID) | INTRAMUSCULAR | Status: DC
  Administered 2018-09-24: 2 mg via INTRAVENOUS
  Filled 2018-09-24: qty 1

## 2018-09-24 MED ORDER — MIRTAZAPINE 7.5 MG PO TABS
7.5000 mg | ORAL_TABLET | Freq: Every day | ORAL | Status: DC
  Administered 2018-09-24 – 2018-09-28 (×5): 7.5 mg via ORAL
  Filled 2018-09-24 (×5): qty 1

## 2018-09-24 MED ORDER — LACTATED RINGERS IV SOLN
INTRAVENOUS | Status: DC
  Administered 2018-09-24 – 2018-09-25 (×2): via INTRAVENOUS

## 2018-09-24 MED ORDER — FOLIC ACID 1 MG PO TABS
1.0000 mg | ORAL_TABLET | Freq: Every day | ORAL | Status: DC
  Administered 2018-09-25 – 2018-09-29 (×5): 1 mg via ORAL
  Filled 2018-09-24 (×5): qty 1

## 2018-09-24 MED ORDER — BUPROPION HCL ER (SR) 100 MG PO TB12
200.0000 mg | ORAL_TABLET | Freq: Every day | ORAL | Status: DC
  Administered 2018-09-24 – 2018-09-29 (×6): 200 mg via ORAL
  Filled 2018-09-24 (×6): qty 2

## 2018-09-24 MED ORDER — METHOCARBAMOL 1000 MG/10ML IJ SOLN
1000.0000 mg | Freq: Once | INTRAVENOUS | Status: AC
  Administered 2018-09-24: 1000 mg via INTRAVENOUS
  Filled 2018-09-24: qty 10

## 2018-09-24 MED ORDER — SODIUM CHLORIDE 0.9 % IV BOLUS
1000.0000 mL | Freq: Once | INTRAVENOUS | Status: AC
  Administered 2018-09-24: 1000 mL via INTRAVENOUS

## 2018-09-24 MED ORDER — KETOROLAC TROMETHAMINE 15 MG/ML IJ SOLN
15.0000 mg | Freq: Four times a day (QID) | INTRAMUSCULAR | Status: DC | PRN
  Administered 2018-09-25 – 2018-09-28 (×8): 15 mg via INTRAVENOUS
  Filled 2018-09-24 (×8): qty 1

## 2018-09-24 MED ORDER — LORAZEPAM 1 MG PO TABS: 0.0000 mg | ORAL_TABLET | Freq: Two times a day (BID) | ORAL | Status: DC

## 2018-09-24 MED ORDER — HYDRALAZINE HCL 20 MG/ML IJ SOLN: 10.0000 mg | INTRAMUSCULAR | Status: DC | PRN

## 2018-09-24 MED ORDER — LORAZEPAM 1 MG PO TABS
1.0000 mg | ORAL_TABLET | Freq: Four times a day (QID) | ORAL | Status: DC | PRN
  Administered 2018-09-25 – 2018-09-26 (×3): 1 mg via ORAL
  Filled 2018-09-24 (×3): qty 1

## 2018-09-24 MED ORDER — FOLIC ACID 1 MG PO TABS
1.0000 mg | ORAL_TABLET | Freq: Every day | ORAL | Status: DC
  Administered 2018-09-24: 1 mg via ORAL
  Filled 2018-09-24: qty 1

## 2018-09-24 MED ORDER — ONDANSETRON HCL 4 MG PO TABS
4.0000 mg | ORAL_TABLET | Freq: Four times a day (QID) | ORAL | Status: DC | PRN
  Administered 2018-09-24: 4 mg via ORAL
  Filled 2018-09-24: qty 1

## 2018-09-24 MED ORDER — CYCLOBENZAPRINE HCL 5 MG PO TABS
5.0000 mg | ORAL_TABLET | Freq: Once | ORAL | Status: AC
  Administered 2018-09-24: 5 mg via ORAL
  Filled 2018-09-24: qty 1

## 2018-09-24 MED ORDER — DOCUSATE SODIUM 100 MG PO CAPS
100.0000 mg | ORAL_CAPSULE | Freq: Two times a day (BID) | ORAL | Status: DC
  Administered 2018-09-24 – 2018-09-29 (×8): 100 mg via ORAL
  Filled 2018-09-24 (×8): qty 1

## 2018-09-24 MED ORDER — ONDANSETRON HCL 4 MG/2ML IJ SOLN: 4.0000 mg | Freq: Four times a day (QID) | INTRAMUSCULAR | Status: DC | PRN

## 2018-09-24 MED ORDER — METHOCARBAMOL 1000 MG/10ML IJ SOLN: 1000.0000 mg | Freq: Once | INTRAMUSCULAR | Status: DC

## 2018-09-24 MED ORDER — METHOCARBAMOL 500 MG PO TABS
500.0000 mg | ORAL_TABLET | Freq: Three times a day (TID) | ORAL | Status: DC
  Administered 2018-09-24 – 2018-09-29 (×14): 500 mg via ORAL
  Filled 2018-09-24 (×14): qty 1

## 2018-09-24 MED ORDER — THIAMINE HCL 100 MG/ML IJ SOLN
Freq: Once | INTRAVENOUS | Status: AC
  Administered 2018-09-24: 17:00:00 via INTRAVENOUS
  Filled 2018-09-24: qty 1000

## 2018-09-24 MED ORDER — THIAMINE HCL 100 MG/ML IJ SOLN: 100.0000 mg | Freq: Every day | INTRAMUSCULAR | Status: DC

## 2018-09-24 MED ORDER — VITAMIN B-1 100 MG PO TABS: 100.0000 mg | ORAL_TABLET | Freq: Every day | ORAL | Status: DC

## 2018-09-24 MED ORDER — LORAZEPAM 2 MG/ML IJ SOLN
0.0000 mg | Freq: Four times a day (QID) | INTRAMUSCULAR | Status: DC
  Administered 2018-09-25: 2 mg via INTRAVENOUS
  Filled 2018-09-24: qty 1

## 2018-09-24 MED ORDER — ACETAMINOPHEN 325 MG PO TABS: 650.0000 mg | ORAL_TABLET | Freq: Four times a day (QID) | ORAL | Status: DC | PRN

## 2018-09-24 MED ORDER — LORAZEPAM 2 MG/ML IJ SOLN: 2.0000 mg | Freq: Once | INTRAMUSCULAR | Status: DC

## 2018-09-24 MED ORDER — LORAZEPAM 2 MG/ML IJ SOLN
1.0000 mg | Freq: Four times a day (QID) | INTRAMUSCULAR | Status: DC | PRN
  Administered 2018-09-24 – 2018-09-25 (×2): 1 mg via INTRAVENOUS
  Filled 2018-09-24 (×2): qty 1

## 2018-09-24 MED ORDER — LORAZEPAM 1 MG PO TABS
0.0000 mg | ORAL_TABLET | Freq: Four times a day (QID) | ORAL | Status: DC
  Administered 2018-09-24: 2 mg via ORAL
  Filled 2018-09-24: qty 2

## 2018-09-24 MED ORDER — ASPIRIN EC 81 MG PO TBEC
81.0000 mg | DELAYED_RELEASE_TABLET | Freq: Every day | ORAL | Status: DC
  Administered 2018-09-24 – 2018-09-29 (×6): 81 mg via ORAL
  Filled 2018-09-24 (×6): qty 1

## 2018-09-24 MED ORDER — THIAMINE HCL 100 MG/ML IJ SOLN
100.0000 mg | Freq: Every day | INTRAMUSCULAR | Status: DC
  Administered 2018-09-24: 100 mg via INTRAVENOUS
  Filled 2018-09-24: qty 2

## 2018-09-24 MED ORDER — ORAL CARE MOUTH RINSE
15.0000 mL | Freq: Two times a day (BID) | OROMUCOSAL | Status: DC
  Administered 2018-09-24 – 2018-09-29 (×10): 15 mL via OROMUCOSAL

## 2018-09-24 NOTE — ED Notes (Signed)
ED TO INPATIENT HANDOFF REPORT  Name/Age/Gender Joshua Henson 72 y.o. male  Code Status Code Status History    Date Active Date Inactive Code Status Order ID Comments User Context   08/03/2018 0040 08/04/2018 1838 Full Code [id number]  Norval Morton, MD ED   07/01/2018 0024 07/03/2018 1740 Full Code [id number]  Jani Gravel, MD ED   11/09/2017 1626 11/21/2017 1846 Full Code [id number]  Geradine Girt, DO ED   11/09/2017 1256 11/09/2017 1625 Full Code [id number]  Blanchie Dessert, MD ED   02/15/2017 0753 02/23/2017 2014 Full Code [id number]  Reyne Dumas, MD ED   09/13/2016 1830 09/23/2016 1759 Full Code [id number]  Mariel Aloe, MD Inpatient   05/10/2016 0149 05/13/2016 1651 Full Code [id number]  Toy Baker, MD ED   03/23/2016 1648 04/04/2016 1636 Full Code [id number]  Cristal Ford, DO Inpatient   01/18/2016 0925 01/19/2016 2146 Full Code [id number]  Cherene Altes, MD Inpatient   01/15/2016 1537 01/18/2016 0925 Full Code [id number]  Burnell Blanks, MD Inpatient   01/10/2016 2158 01/15/2016 1537 Full Code [id number]  Norval Morton, MD Inpatient   04/02/2015 1929 04/08/2015 1938 Full Code [id number]  Deneise Lever, MD ED   12/09/2014 2227 12/11/2014 1214 Full Code [id number]  Evelina Bucy, MD ED   12/09/2014 1622 12/09/2014 2227 Full Code [id number]  Charlott Rakes, MD ED   09/03/2014 0119 09/12/2014 1550 Full Code [id number]  Allie Bossier, MD ED   07/07/2014 1631 07/11/2014 1739 Full Code [id number]  Cathlyn Parsons, PA-C Inpatient   07/02/2014 0338 07/07/2014 1631 Full Code [id number]  Theressa Millard, MD Inpatient      Home/SNF/Other Home  Chief Complaint weakness  Level of Care/Admitting Diagnosis ED Disposition    ED Disposition Condition [facility]: [facility] [[id number]]  Level of Care: Telemetry [5]  Admit to tele based on following criteria: Complex arrhythmia (Bradycardia/Tachycardia)  Diagnosis: Delirium tremens [facility])  [233435]  Admitting Physician: Lavina Hamman [[id number]]  Attending Physician: Lavina Hamman [[id number]]  PT Class (Do Not Modify): Observation [104]  PT Acc Code (Do Not Modify): Observation [10022]       Medical History Past Medical History:  Diagnosis Date  . Acute hepatic encephalopathy   . Alcohol abuse   . Alcoholic cirrhosis of liver without ascites ([place])   . Anxiety state   . CAD (coronary artery disease), native coronary artery 01/10/2016   a. 12/2015 Cath: LM nl, LAD 82m LCX small, nl, OM2 20, OM4 mod, nl, LPDA nl, RCA 60/649m - nondominant-->Med Rx.  . Depression   . Diastolic dysfunction    a. 12/2015 Echo: Ef 55%, Gr1 DD; b. 06/2018 Echo: EF 60-65%, no rwma, Gr1 DD.  . [place] Kitchenatty liver, alcoholic 12/19/53/2080. Hypertension   . Increased ammonia level   . Insomnia   . NSTEMI (non-ST elevated myocardial infarction) ([place]   a. 12/2015 elevated trop in setting of etoh w/d and pna->nonobs dzs on cath->Med Rx; b. 06/2018 Elev trop in setting of etoh w/d->Echo w/ nl EF->Med Rx.  . Prostate cancer ([place]   External beam radiation (urol - GrRisa GrillXRT - Valere Dross . Sinus tachycardia    a. 06/2018 Echo: EF 60-65%.  . Stroke (HCC)     Allergies Allergies  Allergen Reactions  . Lisinopril     syncope  . Celecoxib Rash    IV Location/Drains/Wounds Patient Lines/Drains/Airways Status   Active Line/Drains/Airways  Name:   Placement date:   Placement time:   Site:   Days:   Peripheral IV 09/24/18 Right Antecubital   09/24/18    1053    Antecubital   less than 1          Labs/Imaging Results for orders placed or performed during the hospital encounter of 09/24/18 (from the past 48 hour(s))  CBC with Differential     Status: Abnormal   Collection Time: 09/24/18 10:52 AM  Result Value Ref Range   WBC 5.3 4.0 - 10.5 K/uL   RBC 4.49 4.22 - 5.81 MIL/uL   Hemoglobin 15.4 13.0 - 17.0 g/dL   HCT 44.8 39.0 - 52.0 %   MCV 99.8 78.0 - 100.0 fL   MCH 34.3 (H) 26.0 - 34.0 pg    MCHC 34.4 30.0 - 36.0 g/dL   RDW 14.8 11.5 - 15.5 %   Platelets 247 150 - 400 K/uL   Neutrophils Relative % 79 %   Neutro Abs 4.2 1.7 - 7.7 K/uL   Lymphocytes Relative 13 %   Lymphs Abs 0.7 0.7 - 4.0 K/uL   Monocytes Relative 7 %   Monocytes Absolute 0.4 0.1 - 1.0 K/uL   Eosinophils Relative 0 %   Eosinophils Absolute 0.0 0.0 - 0.7 K/uL   Basophils Relative 1 %   Basophils Absolute 0.0 0.0 - 0.1 K/uL    Comment: Performed at [facility], [place] [address] [place], [place] [address]  Comprehensive metabolic panel     Status: Abnormal   Collection Time: 09/24/18 10:52 AM  Result Value Ref Range   Sodium 143 135 - 145 mmol/L   Potassium 4.1 3.5 - 5.1 mmol/L   Chloride 103 98 - 111 mmol/L   CO2 18 (L) 22 - 32 mmol/L   Glucose, Bld 111 (H) 70 - 99 mg/dL   BUN 18 8 - 23 mg/dL   Creatinine, Ser 1.22 0.61 - 1.24 mg/dL   Calcium 9.1 8.9 - 10.3 mg/dL   Total Protein 7.9 6.5 - 8.1 g/dL   Albumin 4.3 3.5 - 5.0 g/dL   AST 117 (H) 15 - 41 U/L   ALT 58 (H) 0 - 44 U/L   Alkaline Phosphatase 77 38 - 126 U/L   Total Bilirubin 2.2 (H) 0.3 - 1.2 mg/dL   GFR calc non Af Amer 58 (L) >60 mL/min   GFR calc Af Amer >60 >60 mL/min    Comment: (NOTE) The eGFR has been calculated using the CKD EPI equation. This calculation has not been validated in all clinical situations. eGFR's persistently <60 mL/min signify possible Chronic Kidney Disease.    Anion gap 22 (H) 5 - 15    Comment: Performed at [facility], [place] [address], [place], [place] [address]  Troponin I     Status: Abnormal   Collection Time: 09/24/18 10:52 AM  Result Value Ref Range   Troponin I 0.69 (HH) <0.03 ng/mL    Comment: CRITICAL RESULT CALLED TO, READ BACK BY AND VERIFIED WITH: K.ZULETA RN AT 1202 ON 09/24/18 BY S.VANHOORNE Performed at [facility], [place] [address] [place], [place] [address]    Dg Lumbar Spine Complete  Result Date: 09/24/2018 CLINICAL DATA:  Fall.   Back pain EXAM: LUMBAR SPINE - COMPLETE 4+ VIEW COMPARISON:  CT 08/02/2018 FINDINGS: Anterior degenerative spurring in the mid lumbar spine and lower thoracic spine. Degenerative facet disease throughout the lumbar spine, most pronounced at L4-5 and L5-S1. Disc  spaces maintained. No fracture or subluxation. SI joints are symmetric and unremarkable. 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 No acute bony abnormality. Electronically Signed   By: Rolm Baptise M.D.   On: 09/24/2018 11:42    Pending Labs Unresulted Labs (From admission, onward)   None      Vitals/Pain Today's Vitals   09/24/18 1054 09/24/18 1055 09/24/18 1100 09/24/18 1208  BP:  (!) 152/108 (!) 159/114 (!) 154/118  Pulse: (!) 111 (!) 111 94 (!) 101  Resp:  18  18  Temp:      TempSrc:      SpO2:  96% 94% 97%  Weight:      Height:      PainSc:        Isolation Precautions No active isolations  Medications Medications  LORazepam (ATIVAN) injection 0-4 mg (2 mg Intravenous Given 09/24/18 1057)    Or  LORazepam (ATIVAN) tablet 0-4 mg ( Oral See Alternative 09/24/18 1057)  LORazepam (ATIVAN) injection 0-4 mg (has no administration in time range)    Or  LORazepam (ATIVAN) tablet 0-4 mg (has no administration in time range)  thiamine (VITAMIN B-1) tablet 100 mg ( Oral See Alternative 09/24/18 1056)    Or  thiamine (B-1) injection 100 mg (100 mg Intravenous Given 09/24/18 1056)  LORazepam (ATIVAN) injection 2 mg (2 mg Intravenous Not Given 09/24/18 1101)  sodium chloride 0.9 % bolus 1,000 mL (0 mLs Intravenous Stopped 09/24/18 1205)  methocarbamol (ROBAXIN) 1,000 mg in dextrose 5 % 50 mL IVPB (0 mg Intravenous Stopped 09/24/18 1241)    Mobility non-ambulatory

## 2018-09-24 NOTE — Evaluation (Addendum)
Physical Therapy Evaluation Patient Details Name: Joshua Henson MRN: [id number] DOB: Jun 17, 1946 Today's Date: 09/24/2018   History of Present Illness  72yo male c/o of 3-4 days of back pain, increased weakness, and difficulty walking. Normal neuro exam per admitting MD and CT lumbar spine with no acute abnormalities. Admitted for alcohol withdrawal. Note recent hospitalizations for AKI/NSTEMI and alcohol withdrawl this summer. PMH alcohol abuse, anxiety, depression, HTN, NSTEMI, prostate CA, CVA, cardiac cath   Clinical Impression   Spoke to RN regarding elevated troponins; she reports that cardiologist has actually signed off on/cleared patient and is not concerned about this measure- he should be safe to participate in PT. Patient received in bed, pleasant and willing to participate with PT today. Curiously, he was able to perform rolling with Min guard and about 90% of supine to sit transfer with MinA, but then quickly laid back down on the bed with min guard stating "I can't do this, I need 2 people to get me up" despite moving actually fairly well thus far in evaluation. He declined further mobility due to reports of back pain today. He was left in bed with all needs met, bed alarm activated this afternoon. He will continue to benefit from skilled PT services in the acute setting; from findings of evaluation recommend ST-SNF however note this may be updated as patient progresses and is able to be convinced to be more mobile moving forward.     Follow Up Recommendations SNF;Other (comment)(will update as patient progresses )    Equipment Recommendations  Rolling walker with 5" wheels;Other (comment)(otherwise TBD )    Recommendations for Other Services       Precautions / Restrictions Precautions Precautions: Fall;Other (comment) Precaution Comments: back pain Restrictions Weight Bearing Restrictions: No      Mobility  Bed Mobility Overal bed mobility: Needs Assistance Bed  Mobility: Rolling;Supine to Sit;Sit to Supine Rolling: Min guard   Supine to sit: Min assist Sit to supine: Min guard   General bed mobility comments: able to roll and initiate supine to sit, performed 90% of transfer with PT only providing MinA before he quickly laid back down and said "I can't do this without 2 people, you see", decined further mobiltiy with therapist today   Transfers                 General transfer comment: pt declined   Ambulation/Gait             General Gait Details: pt declined   Stairs            Wheelchair Mobility    Modified Rankin (Stroke Patients Only)       Balance Overall balance assessment: Needs assistance;History of Falls Sitting-balance support: Bilateral upper extremity supported;Feet supported Sitting balance-Leahy Scale: Poor     Standing balance support: During functional activity   Standing balance comment: unable to assess due to pt declining                              Pertinent Vitals/Pain Pain Assessment: 0-10 Pain Score: 6  Pain Location: back  Pain Descriptors / Indicators: Aching;Sore Pain Intervention(s): Limited activity within patient's tolerance;Monitored during session;Repositioned    Home Living Family/patient expects to be discharged to:: Private residence Living Arrangements: Spouse/significant other Available Help at Discharge: Other (Comment)(lives with his girlfriend ) Type of Home: House Home Access: Stairs to enter Entrance Stairs-Rails: Right;Left Entrance Stairs-Number of Steps: 3  Home Layout: Two level;Able to live on main level with bedroom/bathroom Home Equipment: Shower seat - built in;Cane - single point;Walker - 2 wheels;Grab bars - tub/shower Additional Comments: Information obtained from previous encounter    Prior Function Level of Independence: Independent         Comments: RW     Hand Dominance   Dominant Hand: Right    Extremity/Trunk  Assessment        Lower Extremity Assessment Lower Extremity Assessment: Overall WFL for tasks assessed    Cervical / Trunk Assessment Cervical / Trunk Assessment: Kyphotic  Communication   Communication: HOH  Cognition Arousal/Alertness: Awake/alert Behavior During Therapy: WFL for tasks assessed/performed Overall Cognitive Status: No family/caregiver present to determine baseline cognitive functioning                                 General Comments: says that he needs 2 people to move, but inconsistent as he can scoot up in the bed using just his legs; no family around to confirm baseline level of function       General Comments      Exercises     Assessment/Plan    PT Assessment Patient needs continued PT services  PT Problem List Decreased strength;Decreased mobility;Decreased safety awareness;Decreased coordination;Decreased activity tolerance;Decreased balance;Pain       PT Treatment Interventions DME instruction;Therapeutic activities;Gait training;Therapeutic exercise;Patient/family education;Stair training;Balance training;Functional mobility training;Neuromuscular re-education;Manual techniques    PT Goals (Current goals can be found in the Care Plan section)  Acute Rehab PT Goals Patient Stated Goal: back to feel better  PT Goal Formulation: With patient Time For Goal Achievement: 10/08/18 Potential to Achieve Goals: Fair    Frequency Min 3X/week   Barriers to discharge        Co-evaluation               AM-PAC PT "6 Clicks" Daily Activity  Outcome Measure Difficulty turning over in bed (including adjusting bedclothes, sheets and blankets)?: A Little Difficulty moving from lying on back to sitting on the side of the bed? : A Little Difficulty sitting down on and standing up from a chair with arms (e.g., wheelchair, bedside commode, etc,.)?: A Lot Help needed moving to and from a bed to chair (including a wheelchair)?: A Lot Help  needed walking in hospital room?: A Lot Help needed climbing 3-5 steps with a railing? : Total 6 Click Score: 13    End of Session   Activity Tolerance: Patient limited by pain Patient left: in bed;with bed alarm set;with call bell/phone within reach   PT Visit Diagnosis: Unsteadiness on feet (R26.81);Muscle weakness (generalized) (M62.81);History of falling (Z91.81);Other abnormalities of gait and mobility (R26.89)    Time: 1550-1600 PT Time Calculation (min) (ACUTE ONLY): 10 min   Charges:   PT Evaluation $PT Eval Moderate Complexity: 1 Mod          Deniece Ree PT, DPT, CBIS  Supplemental Physical Therapist [place]    Pager [phone] Acute Rehab Office [phone]

## 2018-09-24 NOTE — ED Triage Notes (Signed)
EMS reports from home, c/o chronic back pain, weakness and inability to walk since this morning due to chronic back pain, Pt states cyst on spine causing issue.  BP 160/98 HR 104 RR 16 Sp02 97 RA CBG 111

## 2018-09-24 NOTE — ED Notes (Signed)
EDP Schlossman made aware of critical troponin

## 2018-09-24 NOTE — H&P (Signed)
[facility] History and Physical   Patient: Joshua Henson HQP:[id number]   PCP: Clovia Cuff, MD DOB: 04-08-1946   DOA: 09/24/2018   DOS: 09/24/2018   DOS: the patient was seen and examined on 09/24/2018  Patient coming from: The patient is coming from home.  Chief Complaint: back pain  HPI: Joshua Henson is a 72 y.o. male with Past medical history of alcohol abuse, polysubstance abuse, cirrhosis, CAD, depression, chronic diastolic CHF, HTN. Patient presented with complaints of back pain. He mentions that he has chronic back pain because of a cyst in his back which flares up every now and then but this morning when he woke up and he was in severe pain.  He denies any nausea or vomiting.  No fall no trauma no injury.  No diarrhea no constipation.  No tingling or numbness in his leg. He shows that the pain is located in his mid back more on the right side than the left. He drinks heavy alcohol but denies cutting back on them or stopping it lately.  His last drink was last night.  ED Course: X-ray lumbar spine was unremarkable.  Patient was given muscle relaxant with improvement in his pain.  CIWA score per EDP was 15 and patient was referred for alcohol withdrawal.  At his baseline ambulates support And is independent for most of his ADL; manages his medication on his own.  Review of Systems: as mentioned in the history of present illness.  All other systems reviewed and are negative.  Past Medical History:  Diagnosis Date  . Acute hepatic encephalopathy   . Alcohol abuse   . Alcoholic cirrhosis of liver without ascites ([place])   . Anxiety state   . CAD (coronary artery disease), native coronary artery 01/10/2016   a. 12/2015 Cath: LM nl, LAD 70m, LCX small, nl, OM2 20, OM4 mod, nl, LPDA nl, RCA 60/27mid - nondominant-->Med Rx.  . Depression   . Diastolic dysfunction    a. 12/2015 Echo: Ef 55%, Gr1 DD; b. 06/2018 Echo: EF 60-65%, no rwma, Gr1 DD.  Marland Kitchen Fatty liver, alcoholic 5/99/3570    . Hypertension   . Increased ammonia level   . Insomnia   . NSTEMI (non-ST elevated myocardial infarction) ([place])    a. 12/2015 elevated trop in setting of etoh w/d and pna->nonobs dzs on cath->Med Rx; b. 06/2018 Elev trop in setting of etoh w/d->Echo w/ nl EF->Med Rx.  . Prostate cancer ([place]) 2010   External beam radiation (urol - Risa Grill, XRT Valere Dross)  . Sinus tachycardia    a. 06/2018 Echo: EF 60-65%.  . Stroke [facility])    Past Surgical History:  Procedure Laterality Date  . CARDIAC CATHETERIZATION N/A 01/15/2016   Procedure: Left Heart Cath and Coronary Angiography;  Surgeon: Burnell Blanks, MD;  Location: [facility];  Service: Cardiovascular;  Laterality: N/A;   Social History:  reports that he has been smoking cigarettes. He has a 15.00 pack-year smoking history. He has never used smokeless tobacco. He reports that he drinks about 56.0 standard drinks of alcohol per week. He reports that he has current or past drug history. Drug: Marijuana.  Allergies  Allergen Reactions  . Lisinopril     syncope  . Celecoxib Rash    Family History  Problem Relation Age of Onset  . Hypertension Mother   . Diabetes Mother   . Esophageal cancer Mother 14  . Prostate cancer Father 41  . Heart disease Maternal Grandfather  MI  . Heart attack Maternal Grandfather   . Prostate cancer Paternal Grandfather      Prior to Admission medications   Medication Sig Start Date End Date Taking? Authorizing Provider  aspirin 81 MG EC tablet Take 1 tablet (81 mg total) by mouth daily. 07/11/14  Yes Angiulli, Lavon Paganini, PA-C  atorvastatin (LIPITOR) 40 MG tablet Take 1 tablet (40 mg total) by mouth daily at 6 PM. 01/19/16  Yes Allie Bossier, MD  buPROPion [facility] SR) 200 MG 12 hr tablet Take 200 mg by mouth daily.   Yes [provider]  carvedilol (COREG) 12.5 MG tablet Take 1 tablet (12.5 mg total) by mouth 2 (two) times daily. 02/23/17  Yes Florencia Reasons, MD  cholecalciferol  (VITAMIN D) 1000 units tablet Take 1,000 Units by mouth 2 (two) times daily.    Yes [provider]  Coenzyme Q10 (CO Q-10) 200 MG CAPS Take 200 mg by mouth daily.   Yes [provider]  folic acid (FOLVITE) 1 MG tablet take 1 tablet by mouth once daily 01/12/17  Yes Medina-Vargas, Monina C, NP  LORazepam (ATIVAN) 1 MG tablet Take 1 tablet (1 mg total) by mouth 2 (two) times daily as needed for anxiety. 08/04/18  Yes Aline August, MD  Magnesium 500 MG CAPS Take 500 mg by mouth at bedtime.   Yes [provider]  mirtazapine (REMERON) 7.5 MG tablet take 15mg  by mouth at bedtime 08/04/18  Yes Aline August, MD  Multiple Vitamin (MULTIVITAMIN WITH MINERALS) TABS tablet Take 1 tablet by mouth daily. 08/04/18  Yes Aline August, MD  Omega-3 Fatty Acids (FISH OIL) 1000 MG CAPS Take 1,000 mg by mouth 2 (two) times daily.    Yes [provider]  polyethylene glycol (MIRALAX / GLYCOLAX) packet Take 17 g by mouth daily. 07/04/18  Yes Kayleen Memos, DO  potassium chloride SA (K-DUR,KLOR-CON) 20 MEQ tablet Take 1 tablet (20 mEq total) by mouth daily. 08/04/18  Yes Aline August, MD  QUEtiapine (SEROQUEL) 25 MG tablet Take 1 tablet (25 mg total) by mouth daily before breakfast. 11/22/17  Yes Charlynne Cousins, MD  tamsulosin (FLOMAX) 0.4 MG CAPS capsule Take 1 capsule (0.4 mg total) by mouth daily. 07/03/18  Yes Kayleen Memos, DO  thiamine (VITAMIN B-1) 100 MG tablet Take 100 mg by mouth daily.   Yes [provider]  triamterene-hydrochlorothiazide (DYAZIDE) 37.5-25 MG capsule Take 1 capsule by mouth daily. 10/04/17  Yes [provider]  nitroGLYCERIN (NITROSTAT) 0.4 MG SL tablet Place 1 tablet (0.4 mg total) under the tongue every 5 (five) minutes as needed for chest pain. 05/13/16   Velvet Bathe, MD  ondansetron (ZOFRAN) 4 MG tablet Take 1 tablet (4 mg total) by mouth every 6 (six) hours as needed for nausea. Patient not taking: Reported on 09/24/2018  08/04/18   Aline August, MD  pantoprazole (PROTONIX) 40 MG tablet Take 1 tablet (40 mg total) by mouth daily. Patient not taking: Reported on 09/24/2018 08/04/18 09/03/18  Aline August, MD    Physical Exam: Vitals:   09/24/18 1208 09/24/18 1335 09/24/18 1403 09/24/18 1404  BP: (!) 154/118 (!) 154/118 (!) 156/130 (!) 127/97  Pulse: (!) 101 (!) 101 (!) 125 (!) 125  Resp: 18 18 20 18   Temp:   98.5 F (36.9 C)   TempSrc:   Oral   SpO2: 97% 97% 100% 99%  Weight:      Height:        General: Alert,  Awake and Oriented to Time, Place and Person. Appear in moderate distress, affect flat Eyes: PERRL, Conjunctiva normal ENT: Oral Mucosa clear dry. Neck: no JVD, no Abnormal Mass Or lumps Cardiovascular: S1 and S2 Present, no Murmur, Peripheral Pulses Present Respiratory: normal respiratory effort, Bilateral Air entry equal and Decreased, no use of accessory muscle, Clear to Auscultation, no Crackles, no wheezes Abdomen: Bowel Sound present, Soft and no tenderness, no hernia Skin: no redness, no Rash, no induration Extremities: no Pedal edema, no calf tenderness Neurologic: Grossly no focal neuro deficit. Bilaterally Equal motor strength  Labs on Admission:  CBC: Recent Labs  Lab 09/24/18 1052  WBC 5.3  NEUTROABS 4.2  HGB 15.4  HCT 44.8  MCV 99.8  PLT 657   Basic Metabolic Panel: Recent Labs  Lab 09/24/18 1052  NA 143  K 4.1  CL 103  CO2 18*  GLUCOSE 111*  BUN 18  CREATININE 1.22  CALCIUM 9.1   GFR: Estimated Creatinine Clearance: 63.8 mL/min (by C-G formula based on SCr of 1.22 mg/dL). Liver Function Tests: Recent Labs  Lab 09/24/18 1052  AST 117*  ALT 58*  ALKPHOS 77  BILITOT 2.2*  PROT 7.9  ALBUMIN 4.3   No results for input(s): LIPASE, AMYLASE in the last 168 hours. No results for input(s): AMMONIA in the last 168 hours. Coagulation Profile: No results for input(s): INR, PROTIME in the last 168 hours. Cardiac Enzymes: Recent Labs  Lab 09/24/18 1052    TROPONINI 0.69*   BNP (last 3 results) No results for input(s): PROBNP in the last 8760 hours. HbA1C: No results for input(s): HGBA1C in the last 72 hours. CBG: No results for input(s): GLUCAP in the last 168 hours. Lipid Profile: No results for input(s): CHOL, HDL, LDLCALC, TRIG, CHOLHDL, LDLDIRECT in the last 72 hours. Thyroid Function Tests: No results for input(s): TSH, T4TOTAL, FREET4, T3FREE, THYROIDAB in the last 72 hours. Anemia Panel: No results for input(s): VITAMINB12, FOLATE, FERRITIN, TIBC, IRON, RETICCTPCT in the last 72 hours. Urine analysis:    Component Value Date/Time   COLORURINE YELLOW 08/03/2018 [place] 08/03/2018 1133   LABSPEC 1.035 (H) 08/03/2018 1133   PHURINE 5.0 08/03/2018 1133   GLUCOSEU NEGATIVE 08/03/2018 1133   HGBUR NEGATIVE 08/03/2018 1133   BILIRUBINUR NEGATIVE 08/03/2018 1133   KETONESUR 20 (A) 08/03/2018 1133   PROTEINUR NEGATIVE 08/03/2018 1133   UROBILINOGEN 2.0 (H) 04/02/2015 2013   NITRITE NEGATIVE 08/03/2018 1133   LEUKOCYTESUR NEGATIVE 08/03/2018 1133    Radiological Exams on Admission: X-ray Chest Pa And Lateral  Result Date: 09/24/2018 CLINICAL DATA:  Bradycardia.  History of hypertension and CVA EXAM: CHEST - 2 VIEW COMPARISON:  Portable chest x-ray of August 03, 2018 FINDINGS: The lungs are adequately inflated and clear. The heart and pulmonary vascularity are normal. The mediastinum is normal in width. There is no pleural effusion. There is calcification in the wall of the aortic arch. The bony thorax exhibits no acute abnormality. IMPRESSION: There is no active cardiopulmonary disease. Electronically Signed   By: David  Martinique M.D.   On: 09/24/2018 14:57   Dg Lumbar Spine Complete  Result Date: 09/24/2018 CLINICAL DATA:  Fall.  Back pain EXAM: LUMBAR SPINE - COMPLETE 4+ VIEW COMPARISON:  CT 08/02/2018 FINDINGS: Anterior degenerative spurring in the mid lumbar spine and lower thoracic spine. Degenerative facet  disease throughout the lumbar spine, most pronounced at L4-5 and L5-S1. Disc spaces maintained. No fracture or subluxation. SI joints are symmetric and unremarkable. 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 No acute bony abnormality. Electronically Signed   By: Rolm Baptise M.D.   On: 09/24/2018 11:42   Assessment/Plan 1.  Alcohol withdrawal Patient presents with complaints of back pain. Found to be having alcohol withdrawal with CIWA of 15. For now we will continue with IV fluids, CIWA protocol per MedSurg. Low threshold to transfer to ICU if the patient started having hallucinations which she does not have a right now. Continue multivitamins, folic acid and O97. Banana bag x1.  2.  Back pain. Appears to be muscular spasm. X-ray lumbar spine unremarkable. Continue Robaxin. Lorazepam should also help with this.  3.  Elevated troponin. CAD. Essential hypertension with tachycardia. Continue home regimen for blood pressure medication. Oncology consulted in the past and had multiple evaluation. Cardiac cath in 2017 showed mild RCA disease which was managed medically. Continue home regimen.  4.  Chronic CHF. Diastolic. No acute evidence of flareup. While the patient is receiving IV fluids monitor for worsening  5.  Elevated LFT. Secondary to alcohol abuse. Monitor.  Nutrition: cardiac diet DVT Prophylaxis: subcutaneous Heparin  Advance goals of care discussion: full code   Consults: noen  Family Communication: no family was present at bedside, at the time of interview.  Disposition: Admitted as observation telemetry unit. Likely to be discharged home, in 1 days.  Severity of Illness: The appropriate patient status for this patient is OBSERVATION. Observation status is judged to be reasonable and necessary in order to provide the required intensity of service to ensure the patient's safety. The patient's presenting symptoms, physical exam findings,  and initial radiographic and laboratory data in the context of their medical condition is felt to place them at decreased risk for further clinical deterioration. Furthermore, it is anticipated that the patient will be medically stable for discharge from the hospital within 2 midnights of admission. The following factors support the patient status of observation.   " The patient's presenting symptoms include back pain. " The physical exam findings include: Musculoskeletal back pain, elevated CIWA. " The initial radiographic and laboratory data are degenerative disease and L-spine x-ray.     Author: Berle Mull, MD [facility] 09/24/2018  If 7PM-7AM, please contact night-coverage [email or web address]

## 2018-09-24 NOTE — ED Provider Notes (Signed)
[facility] DEPT Provider Note   CSN: [id number] Arrival date & time: 09/24/18  0941     History   Chief Complaint Chief Complaint  Patient presents with  . Back Pain  . Weakness    HPI Joshua Henson is a 72 y.o. male.  HPI   3-4 days of back pain, sharp, achy, middle of lower back Otherwise feel ill, just developed nausea and vomiting, a few months ago woke up vomiting, went to hospital and was admitted.  Admitted in July and August, was admitted for AKI/demand ischemia NSTEMI and another time for etoh withdrawal Is feeling shaky and anxious No numbness or weakness (weak all over, difficulty walking), no loss of control of bowels or bladder No hx of IVDU Yesterday had etoh, less last night Feels horrible, not sure if withdrawing Nausea, vomiting and shaking just started. Has not been able to walk at home due to back pain and generalized weakness. Denies focal weakness.  Had fall but not recently. Hx of back cyst, saw [facility] previously about 4 yrs ago. Thinks this is causing pain again. Used to have steroid shots.   Past Medical History:  Diagnosis Date  . Acute hepatic encephalopathy   . Alcohol abuse   . Alcoholic cirrhosis of liver without ascites ([place])   . Anxiety state   . CAD (coronary artery disease), native coronary artery 01/10/2016   a. 12/2015 Cath: LM nl, LAD 67m, LCX small, nl, OM2 20, OM4 mod, nl, LPDA nl, RCA 60/31mid - nondominant-->Med Rx.  . Depression   . Diastolic dysfunction    a. 12/2015 Echo: Ef 55%, Gr1 DD; b. 06/2018 Echo: EF 60-65%, no rwma, Gr1 DD.  Marland Kitchen Fatty liver, alcoholic 05/15/7823  . Hypertension   . Increased ammonia level   . Insomnia   . NSTEMI (non-ST elevated myocardial infarction) ([place])    a. 12/2015 elevated trop in setting of etoh w/d and pna->nonobs dzs on cath->Med Rx; b. 06/2018 Elev trop in setting of etoh w/d->Echo w/ nl EF->Med Rx.  . Prostate cancer ([place]) 2010   External beam radiation (urol - Risa Grill,  XRT Valere Dross)  . Sinus tachycardia    a. 06/2018 Echo: EF 60-65%.  . Stroke [facility])     Patient Active Problem List   Diagnosis Date Noted  . Alcohol dependence with acute alcoholic intoxication, with unspecified complication ([place]) 23/53/6144  . Abdominal pain 08/03/2018  . Chronic diastolic CHF (congestive heart failure) ([place]) 08/03/2018  . Anxiety 08/03/2018  . Nausea and vomiting 07/01/2018  . Elevated troponin 06/30/2018  . Altered mental status 11/20/2017  . LFT elevation   . Alcoholic ketosis   . Dehydration   . Alcohol withdrawal ([place]) 11/09/2017  . Alcohol-induced acute pancreatitis   . Hematemesis 02/15/2017  . Acute pancreatitis 02/15/2017  . Alcohol dependence with unspecified alcohol-induced disorder ([place])   . Alcohol withdrawal delirium ([place]) 09/13/2016  . C. difficile colitis 03/30/2016  . Essential hypertension 03/23/2016  . Fall   . Tobacco abuse   . Hypokalemia   . CAD (coronary artery disease)   . Cocaine abuse ([place])   . Frequent falls 01/11/2016  . Left rib fracture 01/11/2016  . Acute encephalopathy 01/11/2016  . Polysubstance abuse ([place]) 01/11/2016  . Delirium tremens ([place]) 01/11/2016  . NSTEMI (non-ST elevated myocardial infarction) ([place]) 01/11/2016  . Sepsis due to Enterococcus with acute renal failure and metabolic encephalopathy ([place]) 01/11/2016  . Chronic alcohol abuse   . CAD (coronary artery disease), native  coronary artery 01/10/2016  . Fatty liver, alcoholic 93/23/5573  . Hypomagnesemia 04/03/2015  . Ataxia   . Acute respiratory failure ([place]) 09/05/2014  . Alcohol withdrawal, with delirium ([place]) 09/03/2014  . Unspecified cerebral artery occlusion with cerebral infarction 07/05/2014  . Alcohol abuse 07/02/2014  . Current tobacco use 01/17/2014  . Hearing loss 11/25/2013  . Tinnitus of both ears 11/25/2013    Past Surgical History:  Procedure Laterality Date  . CARDIAC CATHETERIZATION N/A 01/15/2016   Procedure: Left Heart Cath and  Coronary Angiography;  Surgeon: Burnell Blanks, MD;  Location: [facility];  Service: Cardiovascular;  Laterality: N/A;        Home Medications    Prior to Admission medications   Medication Sig Start Date End Date Taking? Authorizing Provider  aspirin 81 MG EC tablet Take 1 tablet (81 mg total) by mouth daily. 07/11/14  Yes Angiulli, Lavon Paganini, PA-C  atorvastatin (LIPITOR) 40 MG tablet Take 1 tablet (40 mg total) by mouth daily at 6 PM. 01/19/16  Yes Allie Bossier, MD  buPROPion [facility] SR) 200 MG 12 hr tablet Take 200 mg by mouth daily.   Yes [provider]  carvedilol (COREG) 12.5 MG tablet Take 1 tablet (12.5 mg total) by mouth 2 (two) times daily. 02/23/17  Yes Florencia Reasons, MD  cholecalciferol (VITAMIN D) 1000 units tablet Take 1,000 Units by mouth 2 (two) times daily.    Yes [provider]  Coenzyme Q10 (CO Q-10) 200 MG CAPS Take 200 mg by mouth daily.   Yes [provider]  folic acid (FOLVITE) 1 MG tablet take 1 tablet by mouth once daily 01/12/17  Yes Medina-Vargas, Monina C, NP  LORazepam (ATIVAN) 1 MG tablet Take 1 tablet (1 mg total) by mouth 2 (two) times daily as needed for anxiety. 08/04/18  Yes Aline August, MD  Magnesium 500 MG CAPS Take 500 mg by mouth at bedtime.   Yes [provider]  mirtazapine (REMERON) 7.5 MG tablet take 15mg  by mouth at bedtime 08/04/18  Yes Aline August, MD  Multiple Vitamin (MULTIVITAMIN WITH MINERALS) TABS tablet Take 1 tablet by mouth daily. 08/04/18  Yes Aline August, MD  Omega-3 Fatty Acids (FISH OIL) 1000 MG CAPS Take 1,000 mg by mouth 2 (two) times daily.    Yes [provider]  polyethylene glycol (MIRALAX / GLYCOLAX) packet Take 17 g by mouth daily. 07/04/18  Yes Kayleen Memos, DO  potassium chloride SA (K-DUR,KLOR-CON) 20 MEQ tablet Take 1 tablet (20 mEq total) by mouth daily. 08/04/18  Yes Aline August, MD  QUEtiapine (SEROQUEL) 25 MG tablet Take 1 tablet (25 mg total) by mouth  daily before breakfast. 11/22/17  Yes Charlynne Cousins, MD  tamsulosin (FLOMAX) 0.4 MG CAPS capsule Take 1 capsule (0.4 mg total) by mouth daily. 07/03/18  Yes Kayleen Memos, DO  thiamine (VITAMIN B-1) 100 MG tablet Take 100 mg by mouth daily.   Yes [provider]  triamterene-hydrochlorothiazide (DYAZIDE) 37.5-25 MG capsule Take 1 capsule by mouth daily. 10/04/17  Yes [provider]  nitroGLYCERIN (NITROSTAT) 0.4 MG SL tablet Place 1 tablet (0.4 mg total) under the tongue every 5 (five) minutes as needed for chest pain. 05/13/16   Velvet Bathe, MD  ondansetron (ZOFRAN) 4 MG tablet Take 1 tablet (4 mg total) by mouth every 6 (six) hours as needed for nausea. Patient not taking: Reported on 09/24/2018 08/04/18   Aline August, MD  pantoprazole (PROTONIX) 40 MG tablet  Take 1 tablet (40 mg total) by mouth daily. Patient not taking: Reported on 09/24/2018 08/04/18 09/03/18  Aline August, MD    Family History Family History  Problem Relation Age of Onset  . Hypertension Mother   . Diabetes Mother   . Esophageal cancer Mother 74  . Prostate cancer Father 48  . Heart disease Maternal Grandfather        MI  . Heart attack Maternal Grandfather   . Prostate cancer Paternal Grandfather     Social History Social History   Tobacco Use  . Smoking status: Current Every Day Smoker    Packs/day: 0.50    Years: 30.00    Pack years: 15.00    Types: Cigarettes  . Smokeless tobacco: Never Used  Substance Use Topics  . Alcohol use: Yes    Alcohol/week: 56.0 standard drinks    Types: 56 Shots of liquor per week    Comment: last drink yesterday  . Drug use: Yes    Types: Marijuana    Comment: smokes intermittently.     Allergies   Lisinopril and Celecoxib   Review of Systems Review of Systems  Constitutional: Positive for fatigue. Negative for fever.  HENT: Negative for sore throat.   Eyes: Negative for visual disturbance.  Respiratory: Negative for shortness of  breath.   Cardiovascular: Negative for chest pain.  Gastrointestinal: Positive for nausea and vomiting. Negative for abdominal pain.  Genitourinary: Negative for difficulty urinating.  Musculoskeletal: Positive for back pain. Negative for neck stiffness.  Skin: Negative for rash.  Neurological: Positive for tremors. Negative for syncope and headaches.  Psychiatric/Behavioral: The patient is nervous/anxious.      Physical Exam Updated Vital Signs BP (!) 127/97 (BP Location: Left Arm)   Pulse (!) 125   Temp 98.5 F (36.9 C) (Oral)   Resp 18   Ht 5\' 10"  (1.778 m)   Wt 90.7 kg   SpO2 99%   BMI 28.70 kg/m   Physical Exam  Constitutional: He is oriented to person, place, and time. He appears well-developed and well-nourished. No distress.  HENT:  Head: Normocephalic and atraumatic.  Eyes: Conjunctivae and EOM are normal.  Neck: Normal range of motion.  Cardiovascular: Regular rhythm, normal heart sounds and intact distal pulses. Tachycardia present. Exam reveals no gallop and no friction rub.  No murmur heard. Pulmonary/Chest: Effort normal and breath sounds normal. No respiratory distress. He has no wheezes. He has no rales.  Abdominal: Soft. He exhibits no distension. There is no tenderness. There is no guarding.  Musculoskeletal: He exhibits no edema.       Lumbar back: He exhibits no tenderness and no bony tenderness.  Neurological: He is alert and oriented to person, place, and time. He displays tremor. No sensory deficit (denies).  Normal flexion-extension at hip, normal flexion extension of knee, dorsi and plantar flexion 5/5  Skin: Skin is warm and dry. He is not diaphoretic.  Psychiatric: His mood appears anxious. He is not actively hallucinating.  Nursing note and vitals reviewed.    ED Treatments / Results  Labs (all labs ordered are listed, but only abnormal results are displayed) Labs Reviewed  CBC WITH DIFFERENTIAL/PLATELET - Abnormal; Notable for the following  components:      Result Value   MCH 34.3 (*)    All other components within normal limits  COMPREHENSIVE METABOLIC PANEL - Abnormal; Notable for the following components:   CO2 18 (*)    Glucose, Bld 111 (*)  AST 117 (*)    ALT 58 (*)    Total Bilirubin 2.2 (*)    GFR calc non Af Amer 58 (*)    Anion gap 22 (*)    All other components within normal limits  TROPONIN I - Abnormal; Notable for the following components:   Troponin I 0.69 (*)    All other components within normal limits  RAPID URINE DRUG SCREEN, HOSP PERFORMED    EKG EKG Interpretation  Date/Time:  Monday September 24 2018 11:05:54 EDT Ventricular Rate:  99 PR Interval:    QRS Duration: 112 QT Interval:  361 QTC Calculation: 466 R Axis:   78 Text Interpretation:  Sinus tachycardia Borderline T abnormalities from prior improved, artifact present Since prior ECG, rate has increased, no significant changes Confirmed by Gareth Morgan [phone]) on 09/24/2018 11:27:41 AM Also confirmed by Gareth Morgan [phone]), editor Hattie Perch ([id number])  on 09/24/2018 11:49:01 AM   Radiology X-ray Chest Pa And Lateral  Result Date: 09/24/2018 CLINICAL DATA:  Bradycardia.  History of hypertension and CVA EXAM: CHEST - 2 VIEW COMPARISON:  Portable chest x-ray of August 03, 2018 FINDINGS: The lungs are adequately inflated and clear. The heart and pulmonary vascularity are normal. The mediastinum is normal in width. There is no pleural effusion. There is calcification in the wall of the aortic arch. The bony thorax exhibits no acute abnormality. IMPRESSION: There is no active cardiopulmonary disease. Electronically Signed   By: David  Martinique M.D.   On: 09/24/2018 14:57   Dg Lumbar Spine Complete  Result Date: 09/24/2018 CLINICAL DATA:  Fall.  Back pain EXAM: LUMBAR SPINE - COMPLETE 4+ VIEW COMPARISON:  CT 08/02/2018 FINDINGS: Anterior degenerative spurring in the mid lumbar spine and lower thoracic spine. Degenerative facet disease  throughout the lumbar spine, most pronounced at L4-5 and L5-S1. Disc spaces maintained. No fracture or subluxation. SI joints are symmetric and unremarkable. 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 No acute bony abnormality. Electronically Signed   By: Rolm Baptise M.D.   On: 09/24/2018 11:42    Procedures Procedures (including critical care time)  Medications Ordered in ED Medications  aspirin EC tablet 81 mg (81 mg Oral Given 09/24/18 1623)  buPROPion (WELLBUTRIN SR) 12 hr tablet 200 mg (200 mg Oral Given 09/24/18 1622)  carvedilol (COREG) tablet 12.5 mg (12.5 mg Oral Given 09/24/18 1624)  mirtazapine (REMERON) tablet 7.5 mg (has no administration in time range)  polyethylene glycol (MIRALAX / GLYCOLAX) packet 17 g (17 g Oral Not Given 09/24/18 1652)  QUEtiapine (SEROQUEL) tablet 25 mg (has no administration in time range)  tamsulosin (FLOMAX) capsule 0.4 mg (0.4 mg Oral Given 09/24/18 1623)  enoxaparin (LOVENOX) injection 40 mg (has no administration in time range)  lactated ringers infusion ( Intravenous Hold 09/24/18 1704)  acetaminophen (TYLENOL) tablet 650 mg (has no administration in time range)    Or  acetaminophen (TYLENOL) suppository 650 mg (has no administration in time range)  docusate sodium (COLACE) capsule 100 mg (has no administration in time range)  ketorolac (TORADOL) 15 MG/ML injection 15 mg (has no administration in time range)  ondansetron (ZOFRAN) tablet 4 mg (4 mg Oral Given 09/24/18 1619)    Or  ondansetron (ZOFRAN) injection 4 mg ( Intravenous See Alternative 09/24/18 1619)  methocarbamol (ROBAXIN) tablet 500 mg (has no administration in time range)  traMADol (ULTRAM) tablet 50 mg (has no administration in time range)  hydrALAZINE (APRESOLINE) injection 10 mg (has no administration in time range)  LORazepam (ATIVAN) tablet 1 mg (has no administration in time range)    Or  LORazepam (ATIVAN) injection 1 mg (has no administration in time  range)  thiamine (VITAMIN B-1) tablet 100 mg (0 mg Oral Duplicate 59/5/63 8756)    Or  thiamine (B-1) injection 100 mg ( Intravenous See Alternative 43/3/29 5188)  folic acid (FOLVITE) tablet 1 mg (0 mg Oral Duplicate 41/6/60 6301)  multivitamin with minerals tablet 1 tablet (0 tablets Oral Duplicate 60/1/09 3235)  LORazepam (ATIVAN) injection 0-4 mg (0 mg Intravenous Duplicate 57/3/22 0254)    Followed by  LORazepam (ATIVAN) injection 0-4 mg (has no administration in time range)  MEDLINE mouth rinse (has no administration in time range)  sodium chloride 0.9 % bolus 1,000 mL (0 mLs Intravenous Stopped 09/24/18 1205)  methocarbamol (ROBAXIN) 1,000 mg in dextrose 5 % 50 mL IVPB (0 mg Intravenous Stopped 09/24/18 1241)  sodium chloride 0.9 % 1,000 mL with thiamine 270 mg, folic acid 1 mg, multivitamins adult 10 mL infusion ( Intravenous Rate/Dose Verify 09/24/18 1911)  cyclobenzaprine (FLEXERIL) tablet 5 mg (5 mg Oral Given 09/24/18 1624)     Initial Impression / Assessment and Plan / ED Course  I have reviewed the triage vital signs and the nursing notes.  Pertinent labs & imaging results that were available during my care of the patient were reviewed by me and considered in my medical decision making (see chart for details).     62-year-old male with a history of alcohol abuse and withdrawal, cirrhosis, coronary artery disease, depression, diastolic dysfunction presents with concern for back pain, feeling of being generally unwell, with tremors and vomiting.  Regarding back pain, patient has a normal neurologic exam and denies any urinary retention or overflow incontinence, stool incontinence, saddle anesthesia, fever, IV drug use, trauma, chronic steroid use or immunocompromise and have low suspicion suspicion for cauda equina,  epidural abscess, or vertebral osteomyelitis. XR done showing degeneration, no acute abnormalities.  Recommend continued outpatient evaluation given age, prior hx of  prostate cancer. 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 Given history, physical, doubt dissection, UTI.   Regarding other symptoms of generalized weakness, tremors, vomiting.  Concern for alcohol withdrawal on exam, with hypertension, tachycardia, tremulousness, vomiting. CIWA 18 on my calculation. Will plan admission for etoh withdrawal. Troponin elevated .69. Has hx of troponin elevation in the past, demand ischemia and suspect elevation today is also secondary to demand. Discussed with Dr. Harrington Challenger of Cardiology, at this time recommend medical management and evaluation, and if changes in pt status Cardiology may be consulted.  Pt admitted for further care of etoh withdrawal, troponin elevation.  Final Clinical Impressions(s) / ED Diagnoses   Final diagnoses:  Acute midline low back pain without sciatica  Alcohol withdrawal syndrome without complication (HCC)  Troponin level elevated    ED Discharge Orders    None       Gareth Morgan, MD 09/24/18 2049

## 2018-09-24 NOTE — ED Notes (Signed)
Bed: WA01 Expected date:  Expected time:  Means of arrival:  Comments: 72yo male, weakness,DT

## 2018-09-25 DIAGNOSIS — F10231 Alcohol dependence with withdrawal delirium: Secondary | ICD-10-CM | POA: Diagnosis not present

## 2018-09-25 LAB — RAPID URINE DRUG SCREEN, HOSP PERFORMED
Amphetamines: NOT DETECTED
BENZODIAZEPINES: POSITIVE — AB
Barbiturates: NOT DETECTED
COCAINE: NOT DETECTED
Opiates: NOT DETECTED
Tetrahydrocannabinol: POSITIVE — AB

## 2018-09-25 MED ORDER — CHLORDIAZEPOXIDE HCL 25 MG PO CAPS
50.0000 mg | ORAL_CAPSULE | Freq: Two times a day (BID) | ORAL | Status: AC
  Administered 2018-09-25 (×2): 50 mg via ORAL
  Filled 2018-09-25 (×2): qty 2

## 2018-09-25 MED ORDER — CHLORDIAZEPOXIDE HCL 25 MG PO CAPS
25.0000 mg | ORAL_CAPSULE | Freq: Two times a day (BID) | ORAL | Status: AC
  Administered 2018-09-27 (×2): 25 mg via ORAL
  Filled 2018-09-25 (×2): qty 1

## 2018-09-25 MED ORDER — CHLORDIAZEPOXIDE HCL 25 MG PO CAPS
25.0000 mg | ORAL_CAPSULE | Freq: Three times a day (TID) | ORAL | Status: AC
  Administered 2018-09-26 (×3): 25 mg via ORAL
  Filled 2018-09-25 (×3): qty 1

## 2018-09-25 MED ORDER — CHLORDIAZEPOXIDE HCL 5 MG PO CAPS
10.0000 mg | ORAL_CAPSULE | Freq: Three times a day (TID) | ORAL | Status: AC
  Administered 2018-09-28 (×3): 10 mg via ORAL
  Filled 2018-09-25 (×3): qty 2

## 2018-09-25 MED ORDER — CHLORDIAZEPOXIDE HCL 5 MG PO CAPS
5.0000 mg | ORAL_CAPSULE | Freq: Three times a day (TID) | ORAL | Status: DC
  Administered 2018-09-29: 5 mg via ORAL
  Filled 2018-09-25: qty 1

## 2018-09-25 NOTE — Progress Notes (Signed)
PROGRESS NOTE [facility]   Joshua Henson   ZOX:[id number] DOB: 07-22-1946  DOA: 09/24/2018 PCP: Clovia Cuff, MD   Brief Narrative:  Joshua Henson 72 year old male with medical history of alcohol abuse, polysubstance abuse, CAD, chronic diastolic CHF, hypertension and depression presented to the emergency department complaining of back pain.  Pain is chronic as he has a cyst on his back which flare every once in a while and he is candidate for surgical treatment due to proximity to the spinal cord.  Patient was unable to deal with pain at home therefore presented to the emergency department where he was noted to have signs of alcohol withdrawal.  Patient reported last drink was about 24 hours ago.  Patient was admitted with working diagnosis of impending alcohol withdrawal and started on CIWA protocol.  Subjective: Patient seen and examined, he is tremulous his  CIWA score for me is 12.  Despite Ativan.  Patient continues to complain of back pain, he like to stop drinking.  Assessment & Plan: Alcohol withdrawal Patient with chronic alcohol abuse, multiple hospitalizations for alcohol withdrawal including intubation in the past.  Initial CIWA was 15, currently 12.  Patient able to take oral medication therefore will start on Librium taper.  We will continue IV Ativan as needed per CIWA score.  Given mild elevated scores will keep overnight if remains stable can discharge home on Librium taper.  Continue multivitamins, folic acid and thiamine.  Seizure precautions.  Chronic back pain X-ray lumbar spine unremarkable Continue Robaxin, Toradol and Ultram as needed.  Ativan will help with muscle resection as well   Elevated troponin  Hx of CAD, chronic elevated TNI, Asymptomatic. Repeat troponin, if trending up consider cardio consult. EKG with no signs of ischemia. Had multiple evaluations in the past. Continue medical management for CAD   Elevated LFT  Due to EtOH, continue to monitor     Chronic diastolic CHF No evidence of fluid overload at this time, reduce IVF. Continue to monitor closely   DVT prophylaxis: Heparin  Code Status: Full Code  Family Communication: Wife at bedside  Disposition Plan: Home in AM if no sign of alcohol withdrawal and tolerating Librium well.  Consultants:  None Procedures:   None  Antimicrobials:  None   Objective: Vitals:   09/25/18 0016 09/25/18 0436 09/25/18 0607 09/25/18 1156  BP: 135/87  (!) 146/93 (!) 138/97  Pulse: 84  72 75  Resp: 20  18 18   Temp: 99.2 F (37.3 C)  98.2 F (36.8 C) 98.7 F (37.1 C)  TempSrc: Oral  Oral Oral  SpO2: 96%  97% 96%  Weight:  93.9 kg    Height:        Intake/Output Summary (Last 24 hours) at 09/25/2018 1705 Last data filed at 09/25/2018 0856 Gross per 24 hour  Intake 1551.56 ml  Output 310 ml  Net 1241.56 ml   Filed Weights   09/24/18 0952 09/25/18 0436  Weight: 90.7 kg 93.9 kg    Examination:  General exam: Appears calm and comfortable, hard hearing HEENT: OP moist and clear Respiratory system: Clear to auscultation. No wheezes,crackle or rhonchi Cardiovascular system: S1 & S2 heard, RRR. No JVD, murmurs, rubs or gallops Gastrointestinal system: Abdomen is nondistended, soft and nontender.  Central nervous system: Alert and oriented.  Nonfocal Extremities: Hand tremor on extension, no lower extremity edema Skin: Mild diaphoretic, no rash Psychiatry: Very anxious, poor judgment and insight.   Data Reviewed: I have personally reviewed following labs  and imaging studies  CBC: Recent Labs  Lab 09/24/18 1052  WBC 5.3  NEUTROABS 4.2  HGB 15.4  HCT 44.8  MCV 99.8  PLT 810   Basic Metabolic Panel: Recent Labs  Lab 09/24/18 1052  NA 143  K 4.1  CL 103  CO2 18*  GLUCOSE 111*  BUN 18  CREATININE 1.22  CALCIUM 9.1   GFR: Estimated Creatinine Clearance: 64.9 mL/min (by C-G formula based on SCr of 1.22 mg/dL). Liver Function Tests: Recent Labs  Lab  09/24/18 1052  AST 117*  ALT 58*  ALKPHOS 77  BILITOT 2.2*  PROT 7.9  ALBUMIN 4.3   No results for input(s): LIPASE, AMYLASE in the last 168 hours. No results for input(s): AMMONIA in the last 168 hours. Coagulation Profile: No results for input(s): INR, PROTIME in the last 168 hours. Cardiac Enzymes: Recent Labs  Lab 09/24/18 1052  TROPONINI 0.69*   BNP (last 3 results) No results for input(s): PROBNP in the last 8760 hours. HbA1C: No results for input(s): HGBA1C in the last 72 hours. CBG: No results for input(s): GLUCAP in the last 168 hours. Lipid Profile: No results for input(s): CHOL, HDL, LDLCALC, TRIG, CHOLHDL, LDLDIRECT in the last 72 hours. Thyroid Function Tests: No results for input(s): TSH, T4TOTAL, FREET4, T3FREE, THYROIDAB in the last 72 hours. Anemia Panel: No results for input(s): VITAMINB12, FOLATE, FERRITIN, TIBC, IRON, RETICCTPCT in the last 72 hours. Sepsis Labs: No results for input(s): PROCALCITON, LATICACIDVEN in the last 168 hours.  No results found for this or any previous visit (from the past 240 hour(s)).    Radiology Studies: X-ray Chest Pa And Lateral  Result Date: 09/24/2018 CLINICAL DATA:  Bradycardia.  History of hypertension and CVA EXAM: CHEST - 2 VIEW COMPARISON:  Portable chest x-ray of August 03, 2018 FINDINGS: The lungs are adequately inflated and clear. The heart and pulmonary vascularity are normal. The mediastinum is normal in width. There is no pleural effusion. There is calcification in the wall of the aortic arch. The bony thorax exhibits no acute abnormality. IMPRESSION: There is no active cardiopulmonary disease. Electronically Signed   By: David  Martinique M.D.   On: 09/24/2018 14:57   Dg Lumbar Spine Complete  Result Date: 09/24/2018 CLINICAL DATA:  Fall.  Back pain EXAM: LUMBAR SPINE - COMPLETE 4+ VIEW COMPARISON:  CT 08/02/2018 FINDINGS: Anterior degenerative spurring in the mid lumbar spine and lower thoracic spine.  Degenerative facet disease throughout the lumbar spine, most pronounced at L4-5 and L5-S1. Disc spaces maintained. No fracture or subluxation. SI joints are symmetric and unremarkable. 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 No acute bony abnormality. Electronically Signed   By: Rolm Baptise M.D.   On: 09/24/2018 11:42      Scheduled Meds: . aspirin EC  81 mg Oral Daily  . buPROPion  200 mg Oral Daily  . carvedilol  12.5 mg Oral BID  . chlordiazePOXIDE  50 mg Oral BID   Followed by  . [START ON 09/26/2018] chlordiazePOXIDE  25 mg Oral TID   Followed by  . [START ON 09/27/2018] chlordiazePOXIDE  25 mg Oral BID   Followed by  . [START ON 09/28/2018] chlordiazePOXIDE  10 mg Oral TID   Followed by  . [START ON 09/29/2018] chlordiazePOXIDE  5 mg Oral TID  . docusate sodium  100 mg Oral BID  . enoxaparin (LOVENOX) injection  40 mg Subcutaneous Q24H  . folic acid  1 mg Oral Daily  . mouth rinse  15 mL Mouth Rinse BID  . methocarbamol  500 mg Oral TID  . mirtazapine  7.5 mg Oral QHS  . multivitamin with minerals  1 tablet Oral Daily  . polyethylene glycol  17 g Oral Daily  . QUEtiapine  25 mg Oral QAC breakfast  . tamsulosin  0.4 mg Oral Daily  . thiamine  100 mg Oral Daily   Or  . thiamine  100 mg Intravenous Daily   Continuous Infusions: . lactated ringers 100 mL/hr at 09/25/18 0820     LOS: 0 days    Time spent: Total of 35 minutes spent with pt, greater than 50% of which was spent in discussion of  treatment, counseling and coordination of care   Chipper Oman, MD Pager: Text Page via [email or web address]   If 7PM-7AM, please contact night-coverage [email or web address] 09/25/2018, 5:05 PM   Note - This record has been created using [employer]. Chart creation errors have been sought, but may not always have been located. Such creation errors do not reflect on the standard of medical care.

## 2018-09-25 NOTE — Progress Notes (Signed)
OT Cancellation Note  Patient Details Name: Joshua Henson MRN: [id number] DOB: September 27, 1946   Cancelled Treatment:    Reason Eval/Treat Not Completed: Other (comment)  MD was with pt; will return after lunch.  Joshua Henson 09/25/2018, 11:39 AM  Lesle Chris, OTR/L Acute Rehabilitation Services [phone] [facility] pager [phone] office 09/25/2018

## 2018-09-25 NOTE — Evaluation (Signed)
Occupational Therapy Evaluation Patient Details Name: Joshua Henson MRN: [id number] DOB: 1946/12/16 Today's Date: 09/25/2018    History of Present Illness 72yo male c/o of 3-4 days of back pain, increased weakness, and difficulty walking. Normal neuro exam per admitting MD and CT lumbar spine with no acute abnormalities. Admitted for alcohol withdrawal. Note recent hospitalizations for AKI/NSTEMI and alcohol withdrawl this summer. PMH alcohol abuse, anxiety, depression, HTN, NSTEMI, prostate CA, CVA, cardiac cath    Clinical Impression   This 72 year old man was admitted for the above.  He reports that he fell at home and had to crawl back to bed.  Normally, he is independent. Pt has back pain; educated on strategies to avoid back strain during adls. Pt was not willing to stand at time of evaluation.  RN reports that he is beginning DT's. Will follow in acute setting with min A to min guard level goals    Follow Up Recommendations   A mobility/ADLs vs SNF depending upon progress   Equipment Recommendations    3:1 commode   Recommendations for Other Services       Precautions / Restrictions Precautions Precautions: Fall Precaution Comments: back pain Restrictions Weight Bearing Restrictions: No      Mobility Bed Mobility     Rolling: Supervision Sidelying to sit: Min guard     Sit to sidelying: Min guard General bed mobility comments: assist for line  Transfers                 General transfer comment: pt declined    Balance     Sitting balance-Leahy Scale: Fair                                     ADL either performed or assessed with clinical judgement   ADL Overall ADL's : Needs assistance/impaired Eating/Feeding: Independent   Grooming: Set up;Wash/dry hands;Wash/dry face   Upper Body Bathing: Set up   Lower Body Bathing: Moderate assistance   Upper Body Dressing : Minimal assistance   Lower Body Dressing: Moderate assistance                  General ADL Comments: pt was only willing to sit EOB today.  ADLs assessed from sitting EOB to lying down.  Pt would not attempt sidestepping nor scooting along EOB. He was able to lie down and scoot up from lying position. He can cross RLE for adls but not left.  Educated on AE to minimize back pain     Vision         Perception     Praxis      Pertinent Vitals/Pain Pain Score: 5  Pain Location: back  Pain Descriptors / Indicators: Aching;Sore Pain Intervention(s): Limited activity within patient's tolerance;Monitored during session;Repositioned     Hand Dominance     Extremity/Trunk Assessment Upper Extremity Assessment Upper Extremity Assessment: Overall WFL for tasks assessed(minimal tremor; able to hold drink;washcloth)           Communication Communication Communication: HOH   Cognition Arousal/Alertness: Awake/alert Behavior During Therapy: WFL for tasks assessed/performed Overall Cognitive Status: No family/caregiver present to determine baseline cognitive functioning                                 General Comments: wfls during OT evaluation   General Comments  Exercises     Shoulder Instructions      Home Living Family/patient expects to be discharged to:: Private residence Living Arrangements: Spouse/significant other Available Help at Discharge: (girlfriend)               Bathroom Shower/Tub: Occupational psychologist: Standard     Home Equipment: Shower seat - built in;Cane - single point;Walker - 2 wheels;Grab bars - tub/shower          Prior Functioning/Environment Level of Independence: Independent        Comments: RW        OT Problem List: Decreased strength;Decreased activity tolerance;Pain;Decreased knowledge of use of DME or AE(balance NT)      OT Treatment/Interventions: Self-care/ADL training;Energy conservation;DME and/or AE instruction;Patient/family  education;Therapeutic activities(balance, if needed)    OT Goals(Current goals can be found in the care plan section) Acute Rehab OT Goals Patient Stated Goal: back to feel better  OT Goal Formulation: With patient Time For Goal Achievement: 10/09/18 Potential to Achieve Goals: Good ADL Goals Pt Will Transfer to Toilet: with min guard assist;stand pivot transfer;ambulating;bedside commode Pt Will Perform Toileting - Clothing Manipulation and hygiene: with min guard assist;sit to/from stand Additional ADL Goal #1: pt will perform LB adls with set up/AE as needed with min guard, sit to stand  OT Frequency: Min 2X/week   Barriers to D/C:            Co-evaluation              AM-PAC PT "6 Clicks" Daily Activity     Outcome Measure Help from another person eating meals?: None Help from another person taking care of personal grooming?: A Little Help from another person toileting, which includes using toliet, bedpan, or urinal?: A Lot Help from another person bathing (including washing, rinsing, drying)?: A Lot Help from another person to put on and taking off regular upper body clothing?: A Little Help from another person to put on and taking off regular lower body clothing?: A Lot 6 Click Score: 16   End of Session    Activity Tolerance: Patient limited by pain;Patient limited by fatigue Patient left: in bed;with call bell/phone within reach;with bed alarm set  OT Visit Diagnosis: Muscle weakness (generalized) (M62.81);History of falling (Z91.81)                Time: 6060-0459 OT Time Calculation (min): 20 min Charges:  OT General Charges $OT Visit: 1 Visit OT Evaluation $OT Eval Low Complexity: [place], OTR/L Acute Rehabilitation Services [phone] [facility] pager [phone] office 09/25/2018  [place] 09/25/2018, 4:08 PM

## 2018-09-26 DIAGNOSIS — Z8546 Personal history of malignant neoplasm of prostate: Secondary | ICD-10-CM | POA: Diagnosis not present

## 2018-09-26 DIAGNOSIS — Y904 Blood alcohol level of 80-99 mg/100 ml: Secondary | ICD-10-CM | POA: Diagnosis present

## 2018-09-26 DIAGNOSIS — F17210 Nicotine dependence, cigarettes, uncomplicated: Secondary | ICD-10-CM | POA: Diagnosis present

## 2018-09-26 DIAGNOSIS — F329 Major depressive disorder, single episode, unspecified: Secondary | ICD-10-CM | POA: Diagnosis present

## 2018-09-26 DIAGNOSIS — I248 Other forms of acute ischemic heart disease: Secondary | ICD-10-CM | POA: Diagnosis present

## 2018-09-26 DIAGNOSIS — K700 Alcoholic fatty liver: Secondary | ICD-10-CM | POA: Diagnosis present

## 2018-09-26 DIAGNOSIS — I2510 Atherosclerotic heart disease of native coronary artery without angina pectoris: Secondary | ICD-10-CM | POA: Diagnosis present

## 2018-09-26 DIAGNOSIS — Z9181 History of falling: Secondary | ICD-10-CM | POA: Diagnosis not present

## 2018-09-26 DIAGNOSIS — M62830 Muscle spasm of back: Secondary | ICD-10-CM | POA: Diagnosis present

## 2018-09-26 DIAGNOSIS — G4700 Insomnia, unspecified: Secondary | ICD-10-CM | POA: Diagnosis present

## 2018-09-26 DIAGNOSIS — R2681 Unsteadiness on feet: Secondary | ICD-10-CM | POA: Diagnosis not present

## 2018-09-26 DIAGNOSIS — F411 Generalized anxiety disorder: Secondary | ICD-10-CM | POA: Diagnosis present

## 2018-09-26 DIAGNOSIS — R112 Nausea with vomiting, unspecified: Secondary | ICD-10-CM | POA: Diagnosis present

## 2018-09-26 DIAGNOSIS — I5032 Chronic diastolic (congestive) heart failure: Secondary | ICD-10-CM | POA: Diagnosis present

## 2018-09-26 DIAGNOSIS — Z8673 Personal history of transient ischemic attack (TIA), and cerebral infarction without residual deficits: Secondary | ICD-10-CM | POA: Diagnosis not present

## 2018-09-26 DIAGNOSIS — R7989 Other specified abnormal findings of blood chemistry: Secondary | ICD-10-CM | POA: Diagnosis present

## 2018-09-26 DIAGNOSIS — K7030 Alcoholic cirrhosis of liver without ascites: Secondary | ICD-10-CM | POA: Diagnosis present

## 2018-09-26 DIAGNOSIS — R945 Abnormal results of liver function studies: Secondary | ICD-10-CM | POA: Diagnosis present

## 2018-09-26 DIAGNOSIS — F10231 Alcohol dependence with withdrawal delirium: Principal | ICD-10-CM

## 2018-09-26 DIAGNOSIS — G8929 Other chronic pain: Secondary | ICD-10-CM | POA: Diagnosis present

## 2018-09-26 DIAGNOSIS — M545 Low back pain: Secondary | ICD-10-CM | POA: Diagnosis present

## 2018-09-26 DIAGNOSIS — Z7401 Bed confinement status: Secondary | ICD-10-CM | POA: Diagnosis not present

## 2018-09-26 DIAGNOSIS — E876 Hypokalemia: Secondary | ICD-10-CM | POA: Diagnosis not present

## 2018-09-26 DIAGNOSIS — M2550 Pain in unspecified joint: Secondary | ICD-10-CM | POA: Diagnosis not present

## 2018-09-26 DIAGNOSIS — R260 Ataxic gait: Secondary | ICD-10-CM | POA: Diagnosis not present

## 2018-09-26 DIAGNOSIS — R0602 Shortness of breath: Secondary | ICD-10-CM | POA: Diagnosis present

## 2018-09-26 DIAGNOSIS — I110 Hypertensive heart disease with heart failure: Secondary | ICD-10-CM | POA: Diagnosis present

## 2018-09-26 LAB — COMPREHENSIVE METABOLIC PANEL
ALK PHOS: 61 U/L (ref 38–126)
ALT: 35 U/L (ref 0–44)
ANION GAP: 11 (ref 5–15)
AST: 56 U/L — ABNORMAL HIGH (ref 15–41)
Albumin: 3.3 g/dL — ABNORMAL LOW (ref 3.5–5.0)
BILIRUBIN TOTAL: 0.9 mg/dL (ref 0.3–1.2)
BUN: 13 mg/dL (ref 8–23)
CO2: 24 mmol/L (ref 22–32)
CREATININE: 0.95 mg/dL (ref 0.61–1.24)
Calcium: 8.3 mg/dL — ABNORMAL LOW (ref 8.9–10.3)
Chloride: 104 mmol/L (ref 98–111)
Glucose, Bld: 139 mg/dL — ABNORMAL HIGH (ref 70–99)
Potassium: 3.2 mmol/L — ABNORMAL LOW (ref 3.5–5.1)
SODIUM: 139 mmol/L (ref 135–145)
TOTAL PROTEIN: 6.2 g/dL — AB (ref 6.5–8.1)

## 2018-09-26 LAB — CBC WITH DIFFERENTIAL/PLATELET
ABS IMMATURE GRANULOCYTES: 0.03 10*3/uL (ref 0.00–0.07)
BASOS PCT: 1 %
Basophils Absolute: 0.1 10*3/uL (ref 0.0–0.1)
EOS ABS: 0.1 10*3/uL (ref 0.0–0.5)
Eosinophils Relative: 2 %
HCT: 38.1 % — ABNORMAL LOW (ref 39.0–52.0)
Hemoglobin: 12.9 g/dL — ABNORMAL LOW (ref 13.0–17.0)
Immature Granulocytes: 1 %
Lymphocytes Relative: 29 %
Lymphs Abs: 1.5 10*3/uL (ref 0.7–4.0)
MCH: 35 pg — ABNORMAL HIGH (ref 26.0–34.0)
MCHC: 33.9 g/dL (ref 30.0–36.0)
MCV: 103.3 fL — ABNORMAL HIGH (ref 80.0–100.0)
Monocytes Absolute: 0.4 10*3/uL (ref 0.1–1.0)
Monocytes Relative: 7 %
NEUTROS ABS: 3.2 10*3/uL (ref 1.7–7.7)
Neutrophils Relative %: 60 %
PLATELETS: 159 10*3/uL (ref 150–400)
RBC: 3.69 MIL/uL — AB (ref 4.22–5.81)
RDW: 14.3 % (ref 11.5–15.5)
WBC: 5.2 10*3/uL (ref 4.0–10.5)
nRBC: 0 % (ref 0.0–0.2)

## 2018-09-26 LAB — MAGNESIUM: MAGNESIUM: 1.5 mg/dL — AB (ref 1.7–2.4)

## 2018-09-26 MED ORDER — ADULT MULTIVITAMIN W/MINERALS CH: 1.0000 | ORAL_TABLET | Freq: Every day | ORAL | Status: DC

## 2018-09-26 MED ORDER — CARVEDILOL 25 MG PO TABS
25.0000 mg | ORAL_TABLET | Freq: Two times a day (BID) | ORAL | Status: DC
  Administered 2018-09-26 – 2018-09-29 (×7): 25 mg via ORAL
  Filled 2018-09-26 (×7): qty 1

## 2018-09-26 MED ORDER — MAGNESIUM SULFATE 2 GM/50ML IV SOLN
2.0000 g | Freq: Once | INTRAVENOUS | Status: AC
  Administered 2018-09-26: 2 g via INTRAVENOUS
  Filled 2018-09-26: qty 50

## 2018-09-26 MED ORDER — THIAMINE HCL 100 MG/ML IJ SOLN: 100.0000 mg | Freq: Every day | INTRAMUSCULAR | Status: DC

## 2018-09-26 MED ORDER — FOLIC ACID 1 MG PO TABS: 1.0000 mg | ORAL_TABLET | Freq: Every day | ORAL | Status: DC

## 2018-09-26 MED ORDER — LORAZEPAM 2 MG/ML IJ SOLN
1.0000 mg | Freq: Four times a day (QID) | INTRAMUSCULAR | Status: AC | PRN
  Administered 2018-09-27 – 2018-09-29 (×4): 1 mg via INTRAVENOUS
  Filled 2018-09-26 (×5): qty 1

## 2018-09-26 MED ORDER — LORAZEPAM 2 MG/ML IJ SOLN
0.0000 mg | Freq: Two times a day (BID) | INTRAMUSCULAR | Status: DC
  Administered 2018-09-28: 4 mg via INTRAVENOUS
  Filled 2018-09-26: qty 2

## 2018-09-26 MED ORDER — VITAMIN B-1 100 MG PO TABS: 100.0000 mg | ORAL_TABLET | Freq: Every day | ORAL | Status: DC

## 2018-09-26 MED ORDER — SODIUM CHLORIDE 0.9 % IV SOLN
INTRAVENOUS | Status: DC
  Administered 2018-09-26 – 2018-09-27 (×3): via INTRAVENOUS

## 2018-09-26 MED ORDER — LORAZEPAM 1 MG PO TABS
1.0000 mg | ORAL_TABLET | Freq: Four times a day (QID) | ORAL | Status: AC | PRN
  Administered 2018-09-27 – 2018-09-28 (×2): 1 mg via ORAL
  Filled 2018-09-26 (×2): qty 1

## 2018-09-26 MED ORDER — POTASSIUM CHLORIDE CRYS ER 20 MEQ PO TBCR
40.0000 meq | EXTENDED_RELEASE_TABLET | Freq: Once | ORAL | Status: AC
  Administered 2018-09-26: 40 meq via ORAL
  Filled 2018-09-26: qty 2

## 2018-09-26 MED ORDER — LORAZEPAM 2 MG/ML IJ SOLN
0.0000 mg | Freq: Four times a day (QID) | INTRAMUSCULAR | Status: AC
  Administered 2018-09-26 – 2018-09-27 (×5): 2 mg via INTRAVENOUS
  Administered 2018-09-27 – 2018-09-28 (×2): 1 mg via INTRAVENOUS
  Filled 2018-09-26 (×7): qty 1

## 2018-09-26 NOTE — Progress Notes (Signed)
PT Cancellation Note  Patient Details Name: Joshua Henson MRN: [id number] DOB: [birth year]   Cancelled Treatment:    Reason Eval/Treat Not Completed: Other (comment); pt has been agitated/combative this am, asked to defer per RN; will continue efforts    [facility] 09/26/2018, 11:50 AM

## 2018-09-26 NOTE — Progress Notes (Signed)
Pt. Increasingly agitated, arguing in room with wife, who marched out crying. He then starts screaming "NURSE, NURSE." This RN entered room and pt. Is visibly upset and requesting to leave AMA. He is pulling tele off, condom catheter.  Pt. CIWA is 12. MD notified and placed on CIWA protocol. Security assisted RN and patient was able to calm down and decompress. He states social/ money issues as a trigger. 2mg  ativan given. Will continue to monitor.

## 2018-09-26 NOTE — Progress Notes (Signed)
PROGRESS NOTE    Joshua Henson  ZOX:[id number] DOB: Sep 27, 1946 DOA: 09/24/2018 PCP: Clovia Cuff, MD   Brief Narrative: 72 year old male with medical history of alcohol abuse, polysubstance abuse, CAD, chronic diastolic CHF, hypertension and depression presented to the emergency department complaining of back pain.  Pain is chronic as he has a cyst on his back which flare every once in a while and he is candidate for surgical treatment due to proximity to the spinal cord.  Patient was unable to deal with pain at home therefore presented to the emergency department where he was noted to have signs of alcohol withdrawal.  Patient reported last drink was about 24 hours ago.  Patient was admitted with working diagnosis of impending alcohol withdrawal and started on CIWA protocol.  Assessment & Plan:   Principal Problem:   Delirium tremens ([place]) Active Problems:   Alcohol abuse   Alcohol withdrawal, with delirium ([place])   Current tobacco use   CAD (coronary artery disease), native coronary artery   Essential hypertension   Alcohol withdrawal ([place])   LFT elevation  Alcohol withdrawal Patient with chronic alcohol abuse, multiple hospitalizations for alcohol withdrawal including intubation in the past.  Patient agitated combative and delirious this morning.  Security had to come in coming down.  He was given IV Ativan.  CIWA protocol restarted. Continue multivitamins, folic acid and thiamine.  Seizure precautions.  He is at had a very high risk for decompensation with history of multiple intubations and active DTs and alcohol withdrawal seizure.  He is not stable at this time to be discharged.  He is poor p.o. intake is poor and will also start him on IV hydration.  Chronic back pain X-ray lumbar spine unremarkable Continue Robaxin, Toradol and Ultram as needed.  Ativan will help with muscle resection as well   Elevated troponin  Hx of CAD, chronic elevated TNI, Asymptomatic. Repeat troponin,  if trending up consider cardio consult. EKG with no signs of ischemia. Had multiple evaluations in the past. Continue medical management for CAD   Elevated LFT  Due to EtOH, continue to monitor   Chronic diastolic CHF No evidence of fluid overload at this time, reduce IVF. Continue to monitor closely   Hypokalemia hypomagnesemia replete and recheck   DVT prophylaxis: Heparin Code Status full code Family Communication: Wife Disposition Plan: Pending clinical improvement  Consultants: None Procedures: None Antimicrobials: None Subjective: Patient restless thrashing in bed wanting to sign out AMA trying to pull out tubes and condom catheter and telemetry.  Angry upset and yelling. Objective: Vitals:   09/25/18 1839 09/25/18 2105 09/26/18 0511 09/26/18 0858  BP: 140/90 (!) 150/87 (!) 156/106   Pulse: 68 74 72   Resp: 18 18 16    Temp: 97.9 F (36.6 C) 98.5 F (36.9 C) 98.6 F (37 C)   TempSrc: Oral Oral Oral   SpO2: 95% 97% 96% 92%  Weight:      Height:        Intake/Output Summary (Last 24 hours) at 09/26/2018 1403 Last data filed at 09/26/2018 1053 Gross per 24 hour  Intake 1200 ml  Output 875 ml  Net 325 ml   Filed Weights   09/24/18 0952 09/25/18 0436  Weight: 90.7 kg 93.9 kg    Examination:  General exam: Appears confused and delirious Respiratory system: Clear to auscultation. Respiratory effort normal. Cardiovascular system: S1 & S2 heard, RRR. No JVD, murmurs, rubs, gallops or clicks. No pedal edema. Gastrointestinal system: Abdomen is nondistended, soft  and nontender. No organomegaly or masses felt. Normal bowel sounds heard. Central nervous system: Confused and delirious  Extremities: Symmetric 5 x 5 power. Skin: No rashes, lesions or ulcers    Data Reviewed: I have personally reviewed following labs and imaging studies  CBC: Recent Labs  Lab 09/24/18 1052 09/26/18 0408  WBC 5.3 5.2  NEUTROABS 4.2 3.2  HGB 15.4 12.9*  HCT 44.8 38.1*  MCV  99.8 103.3*  PLT 247 935   Basic Metabolic Panel: Recent Labs  Lab 09/24/18 1052 09/26/18 0408  NA 143 139  K 4.1 3.2*  CL 103 104  CO2 18* 24  GLUCOSE 111* 139*  BUN 18 13  CREATININE 1.22 0.95  CALCIUM 9.1 8.3*  MG  --  1.5*   GFR: Estimated Creatinine Clearance: 83.3 mL/min (by C-G formula based on SCr of 0.95 mg/dL). Liver Function Tests: Recent Labs  Lab 09/24/18 1052 09/26/18 0408  AST 117* 56*  ALT 58* 35  ALKPHOS 77 61  BILITOT 2.2* 0.9  PROT 7.9 6.2*  ALBUMIN 4.3 3.3*   No results for input(s): LIPASE, AMYLASE in the last 168 hours. No results for input(s): AMMONIA in the last 168 hours. Coagulation Profile: No results for input(s): INR, PROTIME in the last 168 hours. Cardiac Enzymes: Recent Labs  Lab 09/24/18 1052  TROPONINI 0.69*   BNP (last 3 results) No results for input(s): PROBNP in the last 8760 hours. HbA1C: No results for input(s): HGBA1C in the last 72 hours. CBG: No results for input(s): GLUCAP in the last 168 hours. Lipid Profile: No results for input(s): CHOL, HDL, LDLCALC, TRIG, CHOLHDL, LDLDIRECT in the last 72 hours. Thyroid Function Tests: No results for input(s): TSH, T4TOTAL, FREET4, T3FREE, THYROIDAB in the last 72 hours. Anemia Panel: No results for input(s): VITAMINB12, FOLATE, FERRITIN, TIBC, IRON, RETICCTPCT in the last 72 hours. Sepsis Labs: No results for input(s): PROCALCITON, LATICACIDVEN in the last 168 hours.  No results found for this or any previous visit (from the past 240 hour(s)).       Radiology Studies: X-ray Chest Pa And Lateral  Result Date: 09/24/2018 CLINICAL DATA:  Bradycardia.  History of hypertension and CVA EXAM: CHEST - 2 VIEW COMPARISON:  Portable chest x-ray of August 03, 2018 FINDINGS: The lungs are adequately inflated and clear. The heart and pulmonary vascularity are normal. The mediastinum is normal in width. There is no pleural effusion. There is calcification in the wall of the aortic  arch. The bony thorax exhibits no acute abnormality. IMPRESSION: There is no active cardiopulmonary disease. Electronically Signed   By: David  Martinique M.D.   On: 09/24/2018 14:57        Scheduled Meds: . aspirin EC  81 mg Oral Daily  . buPROPion  200 mg Oral Daily  . carvedilol  25 mg Oral BID  . chlordiazePOXIDE  25 mg Oral TID   Followed by  . [START ON 09/27/2018] chlordiazePOXIDE  25 mg Oral BID   Followed by  . [START ON 09/28/2018] chlordiazePOXIDE  10 mg Oral TID   Followed by  . [START ON 09/29/2018] chlordiazePOXIDE  5 mg Oral TID  . docusate sodium  100 mg Oral BID  . enoxaparin (LOVENOX) injection  40 mg Subcutaneous Q24H  . folic acid  1 mg Oral Daily  . LORazepam  0-4 mg Intravenous Q6H   Followed by  . [START ON 09/28/2018] LORazepam  0-4 mg Intravenous Q12H  . mouth rinse  15 mL Mouth Rinse BID  .  methocarbamol  500 mg Oral TID  . mirtazapine  7.5 mg Oral QHS  . multivitamin with minerals  1 tablet Oral Daily  . polyethylene glycol  17 g Oral Daily  . QUEtiapine  25 mg Oral QAC breakfast  . tamsulosin  0.4 mg Oral Daily  . thiamine  100 mg Oral Daily   Or  . thiamine  100 mg Intravenous Daily   Continuous Infusions: . lactated ringers 50 mL/hr at 09/25/18 1718     LOS: 0 days     Georgette Shell, MD [facility]  If 7PM-7AM, please contact night-coverage [email or web address] Password [email or web address] 09/26/2018, 2:03 PM

## 2018-09-27 LAB — BASIC METABOLIC PANEL
ANION GAP: 9 (ref 5–15)
BUN: 12 mg/dL (ref 8–23)
CALCIUM: 9 mg/dL (ref 8.9–10.3)
CO2: 25 mmol/L (ref 22–32)
Chloride: 109 mmol/L (ref 98–111)
Creatinine, Ser: 0.9 mg/dL (ref 0.61–1.24)
Glucose, Bld: 89 mg/dL (ref 70–99)
POTASSIUM: 3.4 mmol/L — AB (ref 3.5–5.1)
Sodium: 143 mmol/L (ref 135–145)

## 2018-09-27 LAB — MAGNESIUM: MAGNESIUM: 1.7 mg/dL (ref 1.7–2.4)

## 2018-09-27 MED ORDER — AMLODIPINE BESYLATE 5 MG PO TABS
5.0000 mg | ORAL_TABLET | Freq: Every day | ORAL | Status: DC
  Administered 2018-09-27 – 2018-09-28 (×2): 5 mg via ORAL
  Filled 2018-09-27 (×2): qty 1

## 2018-09-27 MED ORDER — POTASSIUM CHLORIDE 10 MEQ/100ML IV SOLN
INTRAVENOUS | Status: AC
  Filled 2018-09-27: qty 100

## 2018-09-27 MED ORDER — POTASSIUM CHLORIDE 10 MEQ/100ML IV SOLN
10.0000 meq | INTRAVENOUS | Status: AC
  Administered 2018-09-27 (×2): 10 meq via INTRAVENOUS
  Filled 2018-09-27: qty 100

## 2018-09-27 MED ORDER — MAGNESIUM SULFATE 4 GM/100ML IV SOLN
4.0000 g | Freq: Once | INTRAVENOUS | Status: AC
  Administered 2018-09-27: 4 g via INTRAVENOUS
  Filled 2018-09-27: qty 100

## 2018-09-27 MED ORDER — POTASSIUM CHLORIDE CRYS ER 20 MEQ PO TBCR
40.0000 meq | EXTENDED_RELEASE_TABLET | Freq: Once | ORAL | Status: AC
  Administered 2018-09-27: 40 meq via ORAL
  Filled 2018-09-27: qty 2

## 2018-09-27 NOTE — Progress Notes (Signed)
The patient's hearing aids were not in the pink plastic box and not visible in the bed with the patient  when the sheets were changed.

## 2018-09-27 NOTE — Progress Notes (Signed)
Physical Therapy Treatment Patient Details Name: Joshua Henson MRN: [id number] DOB: 1946/09/15 Today's Date: 09/27/2018    History of Present Illness   72 year old male with medical history of alcohol abuse, polysubstance abuse, CAD, chronic diastolic CHF, hypertension and depression presented to the emergency department complaining of back pain. Pain is chronic as he has a cyst on his back which flare every once in a while and he is candidate for surgical treatment due to proximity to the spinal cord. Patient was unable to deal with pain at home therefore presented to the emergency department where he was noted to have signs of alcohol withdrawal. Patient reported last drink was about 24 hours ago. Patient was admitted with working diagnosis of impending alcohol withdrawal and started on CIWAprotocol.    PT Comments    Assisted OOB to amb to bathroom.  VERY unsteady gait staggering L/R with delayed corrective reaction.  HIGH FALL RISK  Follow Up Recommendations  (pending progress/mobility)     Equipment Recommendations       Recommendations for Other Services       Precautions / Restrictions Precautions Precautions: Fall Precaution Comments: back pain Restrictions Weight Bearing Restrictions: No    Mobility  Bed Mobility Overal bed mobility: Needs Assistance Bed Mobility: Supine to Sit;Sit to Supine     Supine to sit: Min assist Sit to supine: Min assist   General bed mobility comments: assist with increased time   Transfers   Equipment used: Rolling walker (2 wheeled)             General transfer comment: increased time also assisted to bathroom  Ambulation/Gait Ambulation/Gait assistance: Mod assist Gait Distance (Feet): 20 Feet(10 feet x 2 to and from bathroom) Assistive device: Rolling walker (2 wheeled) Gait Pattern/deviations: Step-to pattern;Step-through pattern;Drifts right/left;Staggering left;Staggering right;Shuffle     General Gait Details:  very unsteady gait with near fall in bathroom   Stairs             Wheelchair Mobility    Modified Rankin (Stroke Patients Only)       Balance                                            Cognition Arousal/Alertness: Awake/alert Behavior During Therapy: Restless Overall Cognitive Status: No family/caregiver present to determine baseline cognitive functioning                                        Exercises      General Comments        Pertinent Vitals/Pain Pain Assessment: No/denies pain    Home Living                      Prior Function            PT Goals (current goals can now be found in the care plan section) Progress towards PT goals: Progressing toward goals    Frequency    Min 3X/week      PT Plan Current plan remains appropriate    Co-evaluation              AM-PAC PT "6 Clicks" Daily Activity  Outcome Measure  Difficulty turning over in bed (including adjusting bedclothes, sheets and blankets)?: A Little Difficulty moving  from lying on back to sitting on the side of the bed? : A Little Difficulty sitting down on and standing up from a chair with arms (e.g., wheelchair, bedside commode, etc,.)?: A Little Help needed moving to and from a bed to chair (including a wheelchair)?: A Little Help needed walking in hospital room?: A Little Help needed climbing 3-5 steps with a railing? : A Little 6 Click Score: 18    End of Session Equipment Utilized During Treatment: Gait belt Activity Tolerance: Other (comment)(distracted loss of hearing aide) Patient left: in bed;with bed alarm set;with call bell/phone within reach Nurse Communication: Mobility status PT Visit Diagnosis: Unsteadiness on feet (R26.81);Muscle weakness (generalized) (M62.81);History of falling (Z91.81);Other abnormalities of gait and mobility (R26.89)     Time: 1036-1100 PT Time Calculation (min) (ACUTE ONLY): 24  min  Charges:  $Gait Training: 8-22 mins $Therapeutic Activity: 8-22 mins                     Rica Koyanagi  PTA Acute  Rehabilitation Services Pager      [phone] Office      [phone]

## 2018-09-27 NOTE — Progress Notes (Addendum)
PROGRESS NOTE    Joshua Henson  PJA:[id number] DOB: [birth year] DOA: 09/24/2018 PCP: Clovia Cuff, MD  Brief Narrative:72 year old male with medical history of alcohol abuse, polysubstance abuse, CAD, chronic diastolic CHF, hypertension and depression presented to the emergency department complaining of back pain. Pain is chronic as he has a cyst on his back which flare every once in a while and he is candidate for surgical treatment due to proximity to the spinal cord. Patient was unable to deal with pain at home therefore presented to the emergency department where he was noted to have signs of alcohol withdrawal. Patient reported last drink was about 24 hours ago. Patient was admitted with working diagnosis of impending alcohol withdrawal and started on CIWAprotocol.    Assessment & Plan:   Principal Problem:   Delirium tremens ([place]) Active Problems:   Alcohol abuse   Alcohol withdrawal, with delirium ([place])   Current tobacco use   CAD (coronary artery disease), native coronary artery   Essential hypertension   Alcohol withdrawal ([place])   LFT elevation    Alcohol withdrawal Patient with chronic alcohol abuse, multiple hospitalizations for alcohol withdrawal including intubation in the past.  Patient agitated combative anxious and delirious, received Ativan 3 times overnight and this morning.  He was restarted on the CIWA protocol yesterday.  Continue multivitamin folic acid and thiamine and seizure precautions.  He was given IV Ativan.seizure precautions.  He is tachycardic and hypertensive still.  He is at had a very high risk for decompensation with history of multiple intubations and active DTs and alcohol withdrawal seizure.  He is not stable at this time to be discharged.   Chronic back pain X-ray lumbar spine unremarkable Continue Robaxin, Toradol and Ultram as needed. Ativan will help with muscle resection as well   Elevated troponin -chronic elevation  asymptomatic  Elevated LFT  Due to EtOH, continue to monitor   Chronic diastolic CHF No evidence of fluid overload at this time, dc  IVF. Continue to monitor closely  Hypokalemia hypomagnesemia replete and recheck  Hypotensive and tachycardia secondary to EtOH withdrawal continue current antihypertensives added Norvasc daily.   DVT prophylaxis: Heparin Code Status full code Family Communication wife Disposition Plan: Pending clinical improvement Consultants: None  Procedures: None Antimicrobials: None  Subjective: Patient resting in bed staff reports they had to give him Ativan this morning and last night to come down.  Objective: Vitals:   09/26/18 2012 09/27/18 0000 09/27/18 0502 09/27/18 0810  BP: (!) 155/103 (!) 162/98 (!) 152/100 (!) 136/100  Pulse: 68 70 83 (!) 102  Resp: 18  18 20   Temp: 98.2 F (36.8 C)  98 F (36.7 C) 97.7 F (36.5 C)  TempSrc:   Oral Oral  SpO2: 96%  92% 96%  Weight:      Height:        Intake/Output Summary (Last 24 hours) at 09/27/2018 1046 Last data filed at 09/27/2018 0605 Gross per 24 hour  Intake 1551.54 ml  Output 575 ml  Net 976.54 ml   Filed Weights   09/24/18 0952 09/25/18 0436  Weight: 90.7 kg 93.9 kg    Examination: Patient resting in bed anxious fused trying to pull out tubes and catheter and telemetry  General exam: Appears calm and comfortable  Respiratory system: Clear to auscultation. Respiratory effort normal. Cardiovascular system: S1 & S2 heard, RRR. No JVD, murmurs, rubs, gallops or clicks. No pedal edema. Gastrointestinal system: Abdomen is nondistended, soft and nontender. No organomegaly or masses felt.  Normal bowel sounds heard. Central nervous system: Confused Extremities: Symmetric 5 x 5 power. Skin: No rashes, lesions or ulcers  Data Reviewed: I have personally reviewed following labs and imaging studies  CBC: Recent Labs  Lab 09/24/18 1052 09/26/18 0408  WBC 5.3 5.2  NEUTROABS 4.2 3.2   HGB 15.4 12.9*  HCT 44.8 38.1*  MCV 99.8 103.3*  PLT 247 376   Basic Metabolic Panel: Recent Labs  Lab 09/24/18 1052 09/26/18 0408  NA 143 139  K 4.1 3.2*  CL 103 104  CO2 18* 24  GLUCOSE 111* 139*  BUN 18 13  CREATININE 1.22 0.95  CALCIUM 9.1 8.3*  MG  --  1.5*   GFR: Estimated Creatinine Clearance: 83.3 mL/min (by C-G formula based on SCr of 0.95 mg/dL). Liver Function Tests: Recent Labs  Lab 09/24/18 1052 09/26/18 0408  AST 117* 56*  ALT 58* 35  ALKPHOS 77 61  BILITOT 2.2* 0.9  PROT 7.9 6.2*  ALBUMIN 4.3 3.3*   No results for input(s): LIPASE, AMYLASE in the last 168 hours. No results for input(s): AMMONIA in the last 168 hours. Coagulation Profile: No results for input(s): INR, PROTIME in the last 168 hours. Cardiac Enzymes: Recent Labs  Lab 09/24/18 1052  TROPONINI 0.69*   BNP (last 3 results) No results for input(s): PROBNP in the last 8760 hours. HbA1C: No results for input(s): HGBA1C in the last 72 hours. CBG: No results for input(s): GLUCAP in the last 168 hours. Lipid Profile: No results for input(s): CHOL, HDL, LDLCALC, TRIG, CHOLHDL, LDLDIRECT in the last 72 hours. Thyroid Function Tests: No results for input(s): TSH, T4TOTAL, FREET4, T3FREE, THYROIDAB in the last 72 hours. Anemia Panel: No results for input(s): VITAMINB12, FOLATE, FERRITIN, TIBC, IRON, RETICCTPCT in the last 72 hours. Sepsis Labs: No results for input(s): PROCALCITON, LATICACIDVEN in the last 168 hours.  No results found for this or any previous visit (from the past 240 hour(s)).       Radiology Studies: No results found.      Scheduled Meds: . amLODipine  5 mg Oral Daily  . aspirin EC  81 mg Oral Daily  . buPROPion  200 mg Oral Daily  . carvedilol  25 mg Oral BID  . chlordiazePOXIDE  25 mg Oral BID   Followed by  . [START ON 09/28/2018] chlordiazePOXIDE  10 mg Oral TID   Followed by  . [START ON 09/29/2018] chlordiazePOXIDE  5 mg Oral TID  . docusate  sodium  100 mg Oral BID  . enoxaparin (LOVENOX) injection  40 mg Subcutaneous Q24H  . folic acid  1 mg Oral Daily  . LORazepam  0-4 mg Intravenous Q6H   Followed by  . [START ON 09/28/2018] LORazepam  0-4 mg Intravenous Q12H  . mouth rinse  15 mL Mouth Rinse BID  . methocarbamol  500 mg Oral TID  . mirtazapine  7.5 mg Oral QHS  . multivitamin with minerals  1 tablet Oral Daily  . polyethylene glycol  17 g Oral Daily  . QUEtiapine  25 mg Oral QAC breakfast  . tamsulosin  0.4 mg Oral Daily  . thiamine  100 mg Oral Daily   Or  . thiamine  100 mg Intravenous Daily   Continuous Infusions: . sodium chloride 75 mL/hr at 09/27/18 0856  . lactated ringers 50 mL/hr at 09/25/18 1718     LOS: 1 day     Georgette Shell, MD  If 7PM-7AM, please contact night-coverage [email or web address] Password [email or web address]  09/27/2018, 10:46 AM

## 2018-09-27 NOTE — NC FL2 (Signed)
[place] LEVEL OF CARE SCREENING TOOL     IDENTIFICATION  Patient Name: Joshua Henson Birthdate: 1946/12/07 Sex: male Admission Date (Current Location): 09/24/2018  [facility] [place] Number:  Herbalist and Address:  [facility],  [place] [address], [place]      Provider Number: [id number]  Attending Physician Name and Address:  Georgette Shell, MD  Relative Name and Phone Number:       Current Level of Care: Hospital Recommended Level of Care: [place] Prior Approval Number:    Date Approved/Denied:   PASRR Number: [id number]  Discharge Plan: SNF    Current Diagnoses: Patient Active Problem List   Diagnosis Date Noted  . Alcohol dependence with acute alcoholic intoxication, with unspecified complication ([place]) 47/82/9562  . Abdominal pain 08/03/2018  . Chronic diastolic CHF (congestive heart failure) ([place]) 08/03/2018  . Anxiety 08/03/2018  . Nausea and vomiting 07/01/2018  . Elevated troponin 06/30/2018  . Altered mental status 11/20/2017  . LFT elevation   . Alcoholic ketosis   . Dehydration   . Alcohol withdrawal ([place]) 11/09/2017  . Alcohol-induced acute pancreatitis   . Hematemesis 02/15/2017  . Acute pancreatitis 02/15/2017  . Alcohol dependence with unspecified alcohol-induced disorder ([place])   . Alcohol withdrawal delirium ([place]) 09/13/2016  . C. difficile colitis 03/30/2016  . Essential hypertension 03/23/2016  . Fall   . Tobacco abuse   . Hypokalemia   . CAD (coronary artery disease)   . Cocaine abuse ([place])   . Frequent falls 01/11/2016  . Left rib fracture 01/11/2016  . Acute encephalopathy 01/11/2016  . Polysubstance abuse ([place]) 01/11/2016  . Delirium tremens ([place]) 01/11/2016  . NSTEMI (non-ST elevated myocardial infarction) ([place]) 01/11/2016  . Sepsis due to Enterococcus with acute renal failure and metabolic encephalopathy ([place]) 01/11/2016  . Chronic alcohol abuse   . CAD  (coronary artery disease), native coronary artery 01/10/2016  . Fatty liver, alcoholic 13/07/6577  . Hypomagnesemia 04/03/2015  . Ataxia   . Acute respiratory failure ([place]) 09/05/2014  . Alcohol withdrawal, with delirium ([place]) 09/03/2014  . Unspecified cerebral artery occlusion with cerebral infarction 07/05/2014  . Alcohol abuse 07/02/2014  . Current tobacco use 01/17/2014  . Hearing loss 11/25/2013  . Tinnitus of both ears 11/25/2013    Orientation RESPIRATION BLADDER Height & Weight     Self, Time, Situation, Place  Normal Incontinent Weight: 207 lb 0.2 oz (93.9 kg) Height:  5\' 10"  (177.8 cm)  BEHAVIORAL SYMPTOMS/MOOD NEUROLOGICAL BOWEL NUTRITION STATUS      Continent Diet(regular diet)  AMBULATORY STATUS COMMUNICATION OF NEEDS Skin   Extensive Assist Verbally Normal                       Personal Care Assistance Level of Assistance  Bathing, Feeding, Dressing Bathing Assistance: Maximum assistance Feeding assistance: Independent Dressing Assistance: Limited assistance     Functional Limitations Info  Sight, Hearing, Speech Sight Info: Adequate Hearing Info: Impaired Speech Info: Adequate    SPECIAL CARE FACTORS FREQUENCY  PT (By licensed PT), OT (By licensed OT)     PT Frequency: 5x OT Frequency: 3x            Contractures Contractures Info: Not present    Additional Factors Info  Code Status, Allergies Code Status Info: full code Allergies Info:  Lisinopril, Celecoxib           Current Medications (09/27/2018):  This is the current hospital  active medication list Current Facility-Administered Medications  Medication Dose Route Frequency Provider Last Rate Last Dose  . acetaminophen (TYLENOL) tablet 650 mg  650 mg Oral Q6H PRN Lavina Hamman, MD       Or  . acetaminophen (TYLENOL) suppository 650 mg  650 mg Rectal Q6H PRN Lavina Hamman, MD      . amLODipine (NORVASC) tablet 5 mg  5 mg Oral Daily Georgette Shell, MD   5 mg at 09/27/18  0831  . aspirin EC tablet 81 mg  81 mg Oral Daily Lavina Hamman, MD   81 mg at 09/27/18 0827  . buPROPion [facility] SR) 12 hr tablet 200 mg  200 mg Oral Daily Lavina Hamman, MD   200 mg at 09/27/18 0846  . carvedilol (COREG) tablet 25 mg  25 mg Oral BID Georgette Shell, MD   25 mg at 09/27/18 0998  . chlordiazePOXIDE (LIBRIUM) capsule 25 mg  25 mg Oral BID Patrecia Pour, Christean Grief, MD   25 mg at 09/27/18 0830   Followed by  . [START ON 09/28/2018] chlordiazePOXIDE (LIBRIUM) capsule 10 mg  10 mg Oral TID Doreatha Lew, MD       Followed by  . [START ON 09/29/2018] chlordiazePOXIDE (LIBRIUM) capsule 5 mg  5 mg Oral TID Patrecia Pour, Christean Grief, MD      . docusate sodium (COLACE) capsule 100 mg  100 mg Oral BID Lavina Hamman, MD   100 mg at 09/27/18 3382  . enoxaparin (LOVENOX) injection 40 mg  40 mg Subcutaneous Q24H Lavina Hamman, MD   40 mg at 09/26/18 2142  . folic acid (FOLVITE) tablet 1 mg  1 mg Oral Daily Lavina Hamman, MD   1 mg at 09/27/18 5053  . hydrALAZINE (APRESOLINE) injection 10 mg  10 mg Intravenous Q4H PRN Lavina Hamman, MD      . ketorolac (TORADOL) 15 MG/ML injection 15 mg  15 mg Intravenous Q6H PRN Lavina Hamman, MD   15 mg at 09/27/18 0332  . lactated ringers infusion   Intravenous Continuous Patrecia Pour, Christean Grief, MD 50 mL/hr at 09/25/18 1718    . LORazepam (ATIVAN) injection 0-4 mg  0-4 mg Intravenous Q6H Georgette Shell, MD   2 mg at 09/27/18 1212   Followed by  . [START ON 09/28/2018] LORazepam (ATIVAN) injection 0-4 mg  0-4 mg Intravenous Q12H Georgette Shell, MD      . LORazepam (ATIVAN) tablet 1 mg  1 mg Oral Q6H PRN Georgette Shell, MD       Or  . LORazepam (ATIVAN) injection 1 mg  1 mg Intravenous Q6H PRN Georgette Shell, MD   1 mg at 09/27/18 0848  . MEDLINE mouth rinse  15 mL Mouth Rinse BID Lavina Hamman, MD   15 mL at 09/27/18 1210  . methocarbamol (ROBAXIN) tablet 500 mg  500 mg Oral TID Lavina Hamman, MD   500 mg at  09/27/18 0827  . mirtazapine (REMERON) tablet 7.5 mg  7.5 mg Oral QHS Lavina Hamman, MD   7.5 mg at 09/26/18 2141  . multivitamin with minerals tablet 1 tablet  1 tablet Oral Daily Lavina Hamman, MD   1 tablet at 09/27/18 0830  . ondansetron (ZOFRAN) tablet 4 mg  4 mg Oral Q6H PRN Lavina Hamman, MD   4 mg at 09/24/18 1619   Or  . ondansetron (ZOFRAN) injection 4 mg  4 mg  Intravenous Q6H PRN Lavina Hamman, MD      . polyethylene glycol [facility] / GLYCOLAX) packet 17 g  17 g Oral Daily Lavina Hamman, MD   17 g at 09/27/18 2423  . QUEtiapine (SEROQUEL) tablet 25 mg  25 mg Oral QAC breakfast Lavina Hamman, MD   25 mg at 09/27/18 0831  . tamsulosin (FLOMAX) capsule 0.4 mg  0.4 mg Oral Daily Lavina Hamman, MD   0.4 mg at 09/27/18 0829  . thiamine (VITAMIN B-1) tablet 100 mg  100 mg Oral Daily Lavina Hamman, MD   100 mg at 09/27/18 0830   Or  . thiamine (B-1) injection 100 mg  100 mg Intravenous Daily Lavina Hamman, MD      . traMADol Veatrice Bourbon) tablet 50 mg  50 mg Oral Q6H PRN Lavina Hamman, MD   50 mg at 09/27/18 0542     Discharge Medications: Please see discharge summary for a list of discharge medications.  Relevant Imaging Results:  Relevant Lab Results:   Additional Information SSN: [id number]  Nila Nephew, LCSW

## 2018-09-28 LAB — BASIC METABOLIC PANEL
ANION GAP: 8 (ref 5–15)
BUN: 11 mg/dL (ref 8–23)
CHLORIDE: 110 mmol/L (ref 98–111)
CO2: 24 mmol/L (ref 22–32)
Calcium: 9.1 mg/dL (ref 8.9–10.3)
Creatinine, Ser: 0.97 mg/dL (ref 0.61–1.24)
GFR calc non Af Amer: >60 mL/min (ref >60)
Glucose, Bld: 127 mg/dL — ABNORMAL HIGH (ref 70–99)
POTASSIUM: 3.4 mmol/L — AB (ref 3.5–5.1)
SODIUM: 142 mmol/L (ref 135–145)

## 2018-09-28 MED ORDER — POTASSIUM CHLORIDE CRYS ER 20 MEQ PO TBCR
40.0000 meq | EXTENDED_RELEASE_TABLET | Freq: Once | ORAL | Status: AC
  Administered 2018-09-28: 40 meq via ORAL
  Filled 2018-09-28: qty 2

## 2018-09-28 MED ORDER — MAGNESIUM SULFATE 2 GM/50ML IV SOLN
2.0000 g | Freq: Once | INTRAVENOUS | Status: AC
  Administered 2018-09-28: 2 g via INTRAVENOUS
  Filled 2018-09-28: qty 50

## 2018-09-28 NOTE — Progress Notes (Signed)
Physical Therapy Treatment Patient Details Name: Joshua Henson MRN: [id number] DOB: 05/17/1946 Today's Date: 09/28/2018    History of Present Illness 72 yo male c/o of 3-4 days of back pain, increased weakness, and difficulty walking. Normal neuro exam per admitting MD and CT lumbar spine with no acute abnormalities. Admitted for alcohol withdrawal. Note recent hospitalizations for AKI/NSTEMI and alcohol withdrawl this summer. PMH alcohol abuse, anxiety, depression, HTN, NSTEMI, prostate CA, CVA, cardiac cath     PT Comments    Progressing with mobility. Continue to recommend SNF.    Follow Up Recommendations  SNF     Equipment Recommendations  Rolling walker with 5" wheels    Recommendations for Other Services       Precautions / Restrictions Precautions Precautions: Fall Precaution Comments: back pain Restrictions Weight Bearing Restrictions: No    Mobility  Bed Mobility Overal bed mobility: Needs Assistance Bed Mobility: Supine to Sit;Sit to Supine Rolling: Supervision Sidelying to sit: Supervision Supine to sit: Min assist;HOB elevated Sit to supine: Min guard;HOB elevated   General bed mobility comments: assist with increased time   Transfers Overall transfer level: Needs assistance Equipment used: Rolling walker (2 wheeled) Transfers: Sit to/from Stand Sit to Stand: Min assist;+2 safety/equipment         General transfer comment: steadying assistance +2 safety/lines  Ambulation/Gait Ambulation/Gait assistance: Min assist;+2 safety/equipment Gait Distance (Feet): 250 Feet Assistive device: Rolling walker (2 wheeled) Gait Pattern/deviations: Step-through pattern;Decreased stride length     General Gait Details: Assist to steady.    Stairs             Wheelchair Mobility    Modified Rankin (Stroke Patients Only)       Balance Overall balance assessment: History of Falls;Needs assistance           Standing balance-Leahy Scale:  Poor Standing balance comment: needs UE support in standing                            Cognition Arousal/Alertness: Awake/alert Behavior During Therapy: WFL for tasks assessed/performed Overall Cognitive Status: Within Functional Limits for tasks assessed                                 General Comments: wfls during OT evaluation      Exercises      General Comments General comments (skin integrity, edema, etc.): Pt ambulated in hall with min A/steadying assistance, +2 safety.  He wanted to walk without walker for the final portion and pt more unsteady needing min physical assistance x 2      Pertinent Vitals/Pain Pain Assessment: Faces Pain Score: 5  Faces Pain Scale: Hurts even more Pain Location: back  Pain Descriptors / Indicators: Aching;Sore Pain Intervention(s): Limited activity within patient's tolerance;Repositioned    Home Living                      Prior Function            PT Goals (current goals can now be found in the care plan section) Progress towards PT goals: Progressing toward goals    Frequency    Min 3X/week      PT Plan Current plan remains appropriate    Co-evaluation   Reason for Co-Treatment: For patient/therapist safety PT goals addressed during session: Mobility/safety with mobility OT goals addressed during session: Strengthening/ROM  AM-PAC PT "6 Clicks" Daily Activity  Outcome Measure  Difficulty turning over in bed (including adjusting bedclothes, sheets and blankets)?: A Little Difficulty moving from lying on back to sitting on the side of the bed? : A Little Difficulty sitting down on and standing up from a chair with arms (e.g., wheelchair, bedside commode, etc,.)?: Unable Help needed moving to and from a bed to chair (including a wheelchair)?: A Little Help needed walking in hospital room?: A Little Help needed climbing 3-5 steps with a railing? : A Lot 6 Click Score: 15    End  of Session Equipment Utilized During Treatment: Gait belt Activity Tolerance: Patient tolerated treatment well Patient left: in bed;with call bell/phone within reach;with bed alarm set   PT Visit Diagnosis: Unsteadiness on feet (R26.81);Muscle weakness (generalized) (M62.81);History of falling (Z91.81);Other abnormalities of gait and mobility (R26.89)     Time: 1440-1455 PT Time Calculation (min) (ACUTE ONLY): 15 min  Charges:  $Gait Training: 8-22 mins                        Weston Anna, PT Acute Rehabilitation Services Pager: [phone] Office: [phone]

## 2018-09-28 NOTE — Progress Notes (Signed)
CSW working with pt and significant other [name] re: disposition plan. Pt reports he has "been in and out of rehab and the hospital," last time in SNF was 11/2017. States he "wants to have the motivation to try again but wondering if I even care enough to try." CSW processed this with him and he identified his relationships with his SO and 3 adult children as motivating factors. He states that he has had issues with alcohol since he was a teenager "and I'll get sober for a while and then just start again." CSW inquired if he has interest in reducing/abstaining from use and he states, "At this point I really doubt it." Pt states he has been able to  abstain while in SNF in the past and plans to again. Pt/SO prefer [facility] SNF. CSW completed FL2 and referral, confirmed acceptance with facility and plan to transition there at DC.  Sharren Bridge, MSW, LCSW Clinical Social Work 09/28/2018 [phone]

## 2018-09-28 NOTE — Progress Notes (Signed)
PROGRESS NOTE    Joshua Henson  YHC:[id number] DOB: 1945-12-29 DOA: 09/24/2018 PCP: Clovia Cuff, MD    Brief Narrative: 72 year old male with medical history of alcohol abuse, polysubstance abuse, CAD, chronic diastolic CHF, hypertension and depression presented to the emergency department complaining of back pain. Pain is chronic as he has a cyst on his back which flare every once in a while and he is candidate for surgical treatment due to proximity to the spinal cord. Patient was unable to deal with pain at home therefore presented to the emergency department where he was noted to have signs of alcohol withdrawal. Patient reported last drink was about 24 hours ago. Patient was admitted with working diagnosis of impending alcohol withdrawal and started on CIWAprotocol.    Assessment & Plan:   Principal Problem:   Delirium tremens ([place]) Active Problems:   Alcohol abuse   Alcohol withdrawal, with delirium ([place])   Current tobacco use   CAD (coronary artery disease), native coronary artery   Essential hypertension   Alcohol withdrawal ([place])   LFT elevation  Alcohol withdrawal Patient with chronic alcohol abuse, multiple hospitalizations for alcohol withdrawal including intubation in the past.Patient agitated combative anxious and delirious, received Ativan 3 times overnight and this morning.  He was restarted on the CIWA protocol due to increased agitation combative and delirious.  He received 2 doses of IV Ativan last night. Continue multivitamin folic acid and thiamine and seizure precautions.seizure precautions.   Tachycardia improved with increasing antihypertensives.  He still hypertensive.He is at  a very high risk for decompensation with history of multiple intubations and active DTs and alcohol withdrawal seizure. He is not stable at this time to be discharged.  Patient was seen by physical therapy yesterday who assisted him from out of bed to bathroom patient was very  unsteady gait and staggering with weight he delayed corrective reaction and was found to be a high fall risk.  He is not a safe discharge home and he will need rehab/SNF placement.  Chronic back pain X-ray lumbar spine unremarkable Continue Robaxin, Toradol and Ultram as needed. Ativan will help with muscle resection as well   Elevated troponin -chronic elevation asymptomatic  Elevated LFT  Due to EtOH, continue to monitor   Chronic diastolic CHF No evidence of fluid overload at this time, dc  IVF. Continue to monitor closely  Hypokalemia hypomagnesemia replete and recheck  Hypertensive and tachycardia secondary to EtOH withdrawal continue current antihypertensives added Norvasc daily.    DVT prophylaxis: Heparin code Status full code Family Communication: Discussed with wife Disposition Plan: Discharge to SNF when bed available   Consultants: None   Procedures: None Antimicrobials: None  Subjective: Resting in bed trying to get up from the bed staff reports very unsteady patient very unstable standing up for even a few seconds  Objective: Vitals:   09/27/18 1342 09/27/18 1459 09/27/18 2111 09/28/18 0420  BP: 118/85  132/86 (!) 150/97  Pulse: 78  74 68  Resp: 20  18 18   Temp: 98.1 F (36.7 C)  98.4 F (36.9 C) 98.1 F (36.7 C)  TempSrc: Oral   Oral  SpO2: 98% 97% 96% 98%  Weight:    95.4 kg  Height:        Intake/Output Summary (Last 24 hours) at 09/28/2018 1311 Last data filed at 09/28/2018 1037 Gross per 24 hour  Intake 1080 ml  Output 1100 ml  Net -20 ml   Filed Weights   09/24/18 0952 09/25/18 0436  09/28/18 0420  Weight: 90.7 kg 93.9 kg 95.4 kg    Examination:  General exam: Appears calm and comfortable  Respiratory system: Clear to auscultation. Respiratory effort normal. Cardiovascular system: S1 & S2 heard, RRR. No JVD, murmurs, rubs, gallops or clicks. No pedal edema. Gastrointestinal system: Abdomen is nondistended, soft and  nontender. No organomegaly or masses felt. Normal bowel sounds heard. Central nervous system: Confused and tremulous  Extremities: No edema Skin: No rashes, lesions or ulcers Psychiatry: Delayed response to questions being asked   Data Reviewed: I have personally reviewed following labs and imaging studies  CBC: Recent Labs  Lab 09/24/18 1052 09/26/18 0408  WBC 5.3 5.2  NEUTROABS 4.2 3.2  HGB 15.4 12.9*  HCT 44.8 38.1*  MCV 99.8 103.3*  PLT 247 623   Basic Metabolic Panel: Recent Labs  Lab 09/24/18 1052 09/26/18 0408 09/27/18 1059 09/28/18 1133  NA 143 139 143 142  K 4.1 3.2* 3.4* 3.4*  CL 103 104 109 110  CO2 18* 24 25 24   GLUCOSE 111* 139* 89 127*  BUN 18 13 12 11   CREATININE 1.22 0.95 0.90 0.97  CALCIUM 9.1 8.3* 9.0 9.1  MG  --  1.5* 1.7  --    GFR: Estimated Creatinine Clearance: 82.2 mL/min (by C-G formula based on SCr of 0.97 mg/dL). Liver Function Tests: Recent Labs  Lab 09/24/18 1052 09/26/18 0408  AST 117* 56*  ALT 58* 35  ALKPHOS 77 61  BILITOT 2.2* 0.9  PROT 7.9 6.2*  ALBUMIN 4.3 3.3*   No results for input(s): LIPASE, AMYLASE in the last 168 hours. No results for input(s): AMMONIA in the last 168 hours. Coagulation Profile: No results for input(s): INR, PROTIME in the last 168 hours. Cardiac Enzymes: Recent Labs  Lab 09/24/18 1052  TROPONINI 0.69*   BNP (last 3 results) No results for input(s): PROBNP in the last 8760 hours. HbA1C: No results for input(s): HGBA1C in the last 72 hours. CBG: No results for input(s): GLUCAP in the last 168 hours. Lipid Profile: No results for input(s): CHOL, HDL, LDLCALC, TRIG, CHOLHDL, LDLDIRECT in the last 72 hours. Thyroid Function Tests: No results for input(s): TSH, T4TOTAL, FREET4, T3FREE, THYROIDAB in the last 72 hours. Anemia Panel: No results for input(s): VITAMINB12, FOLATE, FERRITIN, TIBC, IRON, RETICCTPCT in the last 72 hours. Sepsis Labs: No results for input(s): PROCALCITON,  LATICACIDVEN in the last 168 hours.  No results found for this or any previous visit (from the past 240 hour(s)).       Radiology Studies: No results found.      Scheduled Meds: . amLODipine  5 mg Oral Daily  . aspirin EC  81 mg Oral Daily  . buPROPion  200 mg Oral Daily  . carvedilol  25 mg Oral BID  . chlordiazePOXIDE  10 mg Oral TID   Followed by  . [START ON 09/29/2018] chlordiazePOXIDE  5 mg Oral TID  . docusate sodium  100 mg Oral BID  . enoxaparin (LOVENOX) injection  40 mg Subcutaneous Q24H  . folic acid  1 mg Oral Daily  . LORazepam  0-4 mg Intravenous Q12H  . mouth rinse  15 mL Mouth Rinse BID  . methocarbamol  500 mg Oral TID  . mirtazapine  7.5 mg Oral QHS  . multivitamin with minerals  1 tablet Oral Daily  . polyethylene glycol  17 g Oral Daily  . QUEtiapine  25 mg Oral QAC breakfast  . tamsulosin  0.4 mg Oral Daily  .  thiamine  100 mg Oral Daily   Or  . thiamine  100 mg Intravenous Daily   Continuous Infusions: . lactated ringers 50 mL/hr at 09/25/18 1718     LOS: 2 days     Georgette Shell, MD [facility]  If 7PM-7AM, please contact night-coverage [email or web address] Password [email or web address] 09/28/2018, 1:11 PM

## 2018-09-28 NOTE — Progress Notes (Signed)
Occupational Therapy Treatment Patient Details Name: Joshua Henson MRN: [id number] DOB: [birth year] Today's Date: 09/28/2018    History of present illness 72yo male c/o of 3-4 days of back pain, increased weakness, and difficulty walking. Normal neuro exam per admitting MD and CT lumbar spine with no acute abnormalities. Admitted for alcohol withdrawal. Note recent hospitalizations for AKI/NSTEMI and alcohol withdrawl this summer. PMH alcohol abuse, anxiety, depression, HTN, NSTEMI, prostate CA, CVA, cardiac cath    OT comments  Pt ambulated with min +2 assistance; with walker, second person there for safety and without, min physical assistance +2 required.  Per notes, steadier today.    Follow Up Recommendations  SNF    Equipment Recommendations    possibly 3:1, to be further assessed   Recommendations for Other Services      Precautions / Restrictions Precautions Precautions: Fall Precaution Comments: back pain Restrictions Weight Bearing Restrictions: No       Mobility Bed Mobility     Rolling: Supervision Sidelying to sit: Supervision Supine to sit: Supervision        Transfers Overall transfer level: Needs assistance Equipment used: Rolling walker (2 wheeled) Transfers: Sit to/from Stand Sit to Stand: Min assist         General transfer comment: steadying assistance +2 safety/lines    Balance             Standing balance-Leahy Scale: Poor Standing balance comment: needs UE support in standing                           ADL either performed or assessed with clinical judgement   ADL                           Toilet Transfer: Minimal assistance;+2 for safety/equipment;Ambulation(back to bed)             General ADL Comments: reviewed back precautions to minimize pain during adls and bed mobility. Pt initially wanted to use toilet but decided he would wait.  He is wearing depends garment to avoid accident     Vision        Perception     Praxis      Cognition Arousal/Alertness: Awake/alert Behavior During Therapy: WFL for tasks assessed/performed Overall Cognitive Status: No family/caregiver present to determine baseline cognitive functioning                                 General Comments: wfls during OT evaluation        Exercises     Shoulder Instructions       General Comments Pt ambulated in hall with min A/steadying assistance, +2 safety.  He wanted to walk without walker for the final portion and pt more unsteady needing min physical assistance x 2    Pertinent Vitals/ Pain       Pain Assessment: 0-10 Pain Score: 5  Pain Location: back  Pain Descriptors / Indicators: Aching;Sore Pain Intervention(s): Limited activity within patient's tolerance;Monitored during session;Repositioned  Home Living                                          Prior Functioning/Environment              Frequency  Min 2X/week        Progress Toward Goals  OT Goals(current goals can now be found in the care plan section)  Progress towards OT goals: Progressing toward goals     Plan      Co-evaluation    PT/OT/SLP Co-Evaluation/Treatment: Yes Reason for Co-Treatment: For patient/therapist safety PT goals addressed during session: Mobility/safety with mobility OT goals addressed during session: Strengthening/ROM      AM-PAC PT "6 Clicks" Daily Activity     Outcome Measure   Help from another person eating meals?: None Help from another person taking care of personal grooming?: A Little Help from another person toileting, which includes using toliet, bedpan, or urinal?: A Lot Help from another person bathing (including washing, rinsing, drying)?: A Lot Help from another person to put on and taking off regular upper body clothing?: A Little Help from another person to put on and taking off regular lower body clothing?: A Lot 6 Click Score: 16    End  of Session    OT Visit Diagnosis: Muscle weakness (generalized) (M62.81);History of falling (Z91.81)   Activity Tolerance     Patient Left in bed;with call bell/phone within reach;with bed alarm set   Nurse Communication          Time: [phone] OT Time Calculation (min): 17 min  Charges: OT General Charges $OT Visit: 1 Visit OT Treatments $Therapeutic Activity: (no charge; cotx)  Joshua Henson, [facility] [phone] [place] pager [phone] office 09/28/2018   Joshua Henson 09/28/2018, 3:29 PM

## 2018-09-29 MED ORDER — ONDANSETRON HCL 4 MG PO TABS: 4.0000 mg | ORAL_TABLET | Freq: Four times a day (QID) | ORAL | 0 refills | Status: DC | PRN

## 2018-09-29 MED ORDER — AMLODIPINE BESYLATE 10 MG PO TABS: 10.0000 mg | ORAL_TABLET | Freq: Every day | ORAL | 0 refills | Status: DC

## 2018-09-29 MED ORDER — DOCUSATE SODIUM 100 MG PO CAPS: 100.0000 mg | ORAL_CAPSULE | Freq: Two times a day (BID) | ORAL | 0 refills | Status: DC

## 2018-09-29 MED ORDER — AMLODIPINE BESYLATE 10 MG PO TABS
10.0000 mg | ORAL_TABLET | Freq: Every day | ORAL | Status: DC
  Administered 2018-09-29: 10 mg via ORAL
  Filled 2018-09-29: qty 1

## 2018-09-29 MED ORDER — LORAZEPAM 2 MG/ML IJ SOLN
2.0000 mg | Freq: Once | INTRAMUSCULAR | Status: AC
  Administered 2018-09-29: 2 mg via INTRAVENOUS

## 2018-09-29 MED ORDER — TRAMADOL HCL 50 MG PO TABS: 50.0000 mg | ORAL_TABLET | Freq: Four times a day (QID) | ORAL | 0 refills | Status: DC | PRN

## 2018-09-29 MED ORDER — CHLORDIAZEPOXIDE HCL 5 MG PO CAPS: 5.0000 mg | ORAL_CAPSULE | Freq: Three times a day (TID) | ORAL | 0 refills | Status: DC | PRN

## 2018-09-29 NOTE — Clinical Social Work Placement (Signed)
Patient discharging to [facility] room 102A.  CSW confirmed bed availability and faxed appropriate documents. [facility] will be called for transport.  RN call report to: [place]  NOTE  Date:  09/29/2018  Patient Details  Name: Joshua Henson MRN: [id number] Date of Birth: [birth year]  Clinical Social Work is seeking post-discharge placement for this patient at the [place] level of care (*CSW will initial, date and re-position this form in  chart as items are completed):  Yes   Patient/family provided with [place] Work Department's list of facilities offering this level of care within the geographic area requested by the patient (or if unable, by the patient's family).  Yes   Patient/family informed of their freedom to choose among providers that offer the needed level of care, that participate in Medicare, Medicaid or managed care program needed by the patient, have an available bed and are willing to accept the patient.  Yes   Patient/family informed of [facility]'s ownership interest in [facility] and [facility], as well as of the fact that they are under no obligation to receive care at these facilities.  PASRR submitted to EDS on       PASRR number received on       Existing PASRR number confirmed on 09/29/18     FL2 transmitted to all facilities in geographic area requested by pt/family on       FL2 transmitted to all facilities within larger geographic area on 09/27/18     Patient informed that his/her managed care company has contracts with or will negotiate with certain facilities, including the following:        Yes   Patient/family informed of bed offers received.  Patient chooses bed at [facility]     Physician recommends and patient chooses bed at      Patient to be transferred to [facility] on 09/29/18.  Patient to be transferred to facility by [facility]      Patient family notified on 09/29/18 of transfer.  Name of family member notified:  RN will notify patient and family.     PHYSICIAN       Additional Comment:    _______________________________________________ Pricilla Holm, LCSWA 09/29/2018, 11:13 AM

## 2018-09-29 NOTE — Discharge Summary (Signed)
Physician Discharge Summary  PHEONIX CLINKSCALE TKP:[id number] DOB: 1946-04-28 DOA: 09/24/2018  PCP: Clovia Cuff, MD  Admit date: 09/24/2018 Discharge date: 09/29/2018  Admitted From: Home Disposition: Nursing home  Recommendations for Outpatient Follow-up:  1. Follow up with PCP in 1-2 weeks 2. Please obtain BMP/CBC in one week 3. Follow-up with the cardiologist  Home Health: None Equipment/Devices none  Discharge Condition stable CODE STATUS full code Diet recommendation cardiac Brief/Interim Summary:72 year old male with medical history of alcohol abuse, polysubstance abuse, CAD, chronic diastolic CHF, hypertension and depression presented to the emergency department complaining of back pain. Pain is chronic as he has a cyst on his back which flare every once in a while and he is candidate for surgical treatment due to proximity to the spinal cord. Patient was unable to deal with pain at home therefore presented to the emergency department where he was noted to have signs of alcohol withdrawal. Patient reported last drink was about 24 hours ago. Patient was admitted with working diagnosis of impending alcohol withdrawal and started on CIWAprotocol.    Discharge Diagnoses:  Principal Problem:   Delirium tremens ([place]) Active Problems:   Alcohol abuse   Alcohol withdrawal, with delirium ([place])   Current tobacco use   CAD (coronary artery disease), native coronary artery   Essential hypertension   Alcohol withdrawal (HCC)   LFT elevation  #1 alcohol withdrawal patient with history of chronic alcohol abuse multiple hospitalization for the same including requiring intubation ventilation support.  Patient was treated with IV Ativan and symptomatic treatment.  Condition and symptoms improved with the above treatment.  He will be discharged on Librium 5 mg 3 times a day as needed.  He will be discharged to a skilled nursing facility per physical therapy recommendation as patient is  very unstable on his feet and not able to care for himself.  Patient had hypomagnesemia and hypokalemia which has been replaced.  I would advise check his BMP and CBC and mag twice a week while at skilled nursing facility.  He is much more awake and, today responding appropriately having to take a shower.  #2 patient has chronic back pain for which he takes Ultram.  #3 chronic diastolic CHF patient is not on any diuretics at this time he has been stable.   #4 hypertension patient has been started on Norvasc which is new for him the dose has been increased to 10 mg daily for better control of blood pressure.   Discharge Instructions  Discharge Instructions    Call MD for:  difficulty breathing, headache or visual disturbances   Complete by:  As directed    Call MD for:  persistant dizziness or light-headedness   Complete by:  As directed    Call MD for:  persistant nausea and vomiting   Complete by:  As directed    Call MD for:  severe uncontrolled pain   Complete by:  As directed    Call MD for:  temperature >100.4   Complete by:  As directed    Diet - low sodium heart healthy   Complete by:  As directed    Increase activity slowly   Complete by:  As directed      Allergies as of 09/29/2018      Reactions   Lisinopril    syncope   Celecoxib Rash      Medication List    STOP taking these medications   Magnesium 500 MG Caps   pantoprazole 40 MG tablet  Commonly known as:  PROTONIX   potassium chloride SA 20 MEQ tablet Commonly known as:  K-DUR,KLOR-CON     TAKE these medications   amLODipine 10 MG tablet Commonly known as:  NORVASC Take 1 tablet (10 mg total) by mouth daily.   aspirin 81 MG EC tablet Take 1 tablet (81 mg total) by mouth daily.   atorvastatin 40 MG tablet Commonly known as:  LIPITOR Take 1 tablet (40 mg total) by mouth daily at 6 PM.   buPROPion 200 MG 12 hr tablet Commonly known as:  WELLBUTRIN SR Take 200 mg by mouth daily.   carvedilol  12.5 MG tablet Commonly known as:  COREG Take 1 tablet (12.5 mg total) by mouth 2 (two) times daily.   chlordiazePOXIDE 5 MG capsule Commonly known as:  LIBRIUM Take 1 capsule (5 mg total) by mouth 3 (three) times daily as needed for anxiety.   cholecalciferol 1000 units tablet Commonly known as:  VITAMIN D Take 1,000 Units by mouth 2 (two) times daily.   Co Q-10 200 MG Caps Take 200 mg by mouth daily.   docusate sodium 100 MG capsule Commonly known as:  COLACE Take 1 capsule (100 mg total) by mouth 2 (two) times daily.   Fish Oil 1000 MG Caps Take 1,000 mg by mouth 2 (two) times daily.   folic acid 1 MG tablet Commonly known as:  FOLVITE take 1 tablet by mouth once daily   LORazepam 1 MG tablet Commonly known as:  ATIVAN Take 1 tablet (1 mg total) by mouth 2 (two) times daily as needed for anxiety.   mirtazapine 7.5 MG tablet Commonly known as:  REMERON take 15mg  by mouth at bedtime   multivitamin with minerals Tabs tablet Take 1 tablet by mouth daily.   nitroGLYCERIN 0.4 MG SL tablet Commonly known as:  NITROSTAT Place 1 tablet (0.4 mg total) under the tongue every 5 (five) minutes as needed for chest pain.   ondansetron 4 MG tablet Commonly known as:  ZOFRAN Take 1 tablet (4 mg total) by mouth every 6 (six) hours as needed for nausea.   polyethylene glycol packet Commonly known as:  MIRALAX / GLYCOLAX Take 17 g by mouth daily.   QUEtiapine 25 MG tablet Commonly known as:  SEROQUEL Take 1 tablet (25 mg total) by mouth daily before breakfast.   tamsulosin 0.4 MG Caps capsule Commonly known as:  FLOMAX Take 1 capsule (0.4 mg total) by mouth daily.   thiamine 100 MG tablet Commonly known as:  VITAMIN B-1 Take 100 mg by mouth daily.   triamterene-hydrochlorothiazide 37.5-25 MG capsule Commonly known as:  DYAZIDE Take 1 capsule by mouth daily.       Contact information for follow-up providers    Clovia Cuff, MD Follow up.   Specialty:  Internal  Medicine Contact information: [address] [place] [address] [phone]        Nahser, Wonda Cheng, MD .   Specialty:  Cardiology Contact information: [place] [address] [phone]            Contact information for after-discharge care    Destination    [facility] Preferred SNF .   Service:  Skilled Nursing Contact information: [address] [address] [phone]                 Allergies  Allergen Reactions  . Lisinopril     syncope  . Celecoxib Rash  Consultations:  None   Procedures/Studies: X-ray Chest Pa And Lateral  Result Date: 09/24/2018 CLINICAL DATA:  Bradycardia.  History of hypertension and CVA EXAM: CHEST - 2 VIEW COMPARISON:  Portable chest x-ray of August 03, 2018 FINDINGS: The lungs are adequately inflated and clear. The heart and pulmonary vascularity are normal. The mediastinum is normal in width. There is no pleural effusion. There is calcification in the wall of the aortic arch. The bony thorax exhibits no acute abnormality. IMPRESSION: There is no active cardiopulmonary disease. Electronically Signed   By: David  Martinique M.D.   On: 09/24/2018 14:57   Dg Lumbar Spine Complete  Result Date: 09/24/2018 CLINICAL DATA:  Fall.  Back pain EXAM: LUMBAR SPINE - COMPLETE 4+ VIEW COMPARISON:  CT 08/02/2018 FINDINGS: Anterior degenerative spurring in the mid lumbar spine and lower thoracic spine. Degenerative facet disease throughout the lumbar spine, most pronounced at L4-5 and L5-S1. Disc spaces maintained. No fracture or subluxation. SI joints are symmetric and unremarkable. 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 No acute bony abnormality. Electronically Signed   By: Rolm Baptise M.D.   On: 09/24/2018 11:42    (Echo, Carotid, EGD, Colonoscopy, ERCP)    Subjective:   Discharge Exam: Vitals:   09/28/18 2237 09/29/18 0526  BP:  (!) 149/83 (!) 155/91  Pulse: 68 75  Resp: 20 20  Temp: 98.2 F (36.8 C) 98 F (36.7 C)  SpO2: 96% 93%   Vitals:   09/28/18 0420 09/28/18 1336 09/28/18 2237 09/29/18 0526  BP: (!) 150/97 (!) 148/98 (!) 149/83 (!) 155/91  Pulse: 68 84 68 75  Resp: 18 18 20 20   Temp: 98.1 F (36.7 C) 98.5 F (36.9 C) 98.2 F (36.8 C) 98 F (36.7 C)  TempSrc: Oral Oral Oral Oral  SpO2: 98% 97% 96% 93%  Weight: 95.4 kg     Height:        General: Pt is alert, awake, not in acute distress Cardiovascular: RRR, S1/S2 +, no rubs, no gallops Respiratory: CTA bilaterally, no wheezing, no rhonchi Abdominal: Soft, NT, ND, bowel sounds + Extremities: no edema, no cyanosis    The results of significant diagnostics from this hospitalization (including imaging, microbiology, ancillary and laboratory) are listed below for reference.     Microbiology: No results found for this or any previous visit (from the past 240 hour(s)).   Labs: BNP (last 3 results) No results for input(s): BNP in the last 8760 hours. Basic Metabolic Panel: Recent Labs  Lab 09/24/18 1052 09/26/18 0408 09/27/18 1059 09/28/18 1133  NA 143 139 143 142  K 4.1 3.2* 3.4* 3.4*  CL 103 104 109 110  CO2 18* 24 25 24   GLUCOSE 111* 139* 89 127*  BUN 18 13 12 11   CREATININE 1.22 0.95 0.90 0.97  CALCIUM 9.1 8.3* 9.0 9.1  MG  --  1.5* 1.7  --    Liver Function Tests: Recent Labs  Lab 09/24/18 1052 09/26/18 0408  AST 117* 56*  ALT 58* 35  ALKPHOS 77 61  BILITOT 2.2* 0.9  PROT 7.9 6.2*  ALBUMIN 4.3 3.3*   No results for input(s): LIPASE, AMYLASE in the last 168 hours. No results for input(s): AMMONIA in the last 168 hours. CBC: Recent Labs  Lab 09/24/18 1052 09/26/18 0408  WBC 5.3 5.2  NEUTROABS 4.2 3.2  HGB 15.4 12.9*  HCT 44.8 38.1*  MCV 99.8 103.3*  PLT 247 159   Cardiac Enzymes: Recent Labs  Lab 09/24/18 1052  TROPONINI 0.69*   BNP: Invalid input(s): POCBNP CBG: No results for input(s): GLUCAP in  the last 168 hours. D-Dimer No results for input(s): DDIMER in the last 72 hours. Hgb A1c No results for input(s): HGBA1C in the last 72 hours. Lipid Profile No results for input(s): CHOL, HDL, LDLCALC, TRIG, CHOLHDL, LDLDIRECT in the last 72 hours. Thyroid function studies No results for input(s): TSH, T4TOTAL, T3FREE, THYROIDAB in the last 72 hours.  Invalid input(s): FREET3 Anemia work up No results for input(s): VITAMINB12, FOLATE, FERRITIN, TIBC, IRON, RETICCTPCT in the last 72 hours. Urinalysis    Component Value Date/Time   COLORURINE YELLOW 08/03/2018 1133   APPEARANCEUR CLEAR 08/03/2018 1133   LABSPEC 1.035 (H) 08/03/2018 1133   PHURINE 5.0 08/03/2018 1133   GLUCOSEU NEGATIVE 08/03/2018 1133   HGBUR NEGATIVE 08/03/2018 1133   BILIRUBINUR NEGATIVE 08/03/2018 1133   KETONESUR 20 (A) 08/03/2018 1133   PROTEINUR NEGATIVE 08/03/2018 1133   UROBILINOGEN 2.0 (H) 04/02/2015 2013   NITRITE NEGATIVE 08/03/2018 1133   LEUKOCYTESUR NEGATIVE 08/03/2018 1133   Sepsis Labs Invalid input(s): PROCALCITONIN,  WBC,  LACTICIDVEN Microbiology No results found for this or any previous visit (from the past 240 hour(s)).   Time coordinating discharge:34  minutes  SIGNED:   Georgette Shell, MD  [facility] 09/29/2018, 9:48 AM Pager   If 7PM-7AM, please contact night-coverage [email or web address] Password [email or web address]

## 2018-10-02 DIAGNOSIS — R262 Difficulty in walking, not elsewhere classified: Secondary | ICD-10-CM | POA: Diagnosis not present

## 2018-10-02 DIAGNOSIS — M6281 Muscle weakness (generalized): Secondary | ICD-10-CM | POA: Diagnosis not present

## 2018-10-02 DIAGNOSIS — R5381 Other malaise: Secondary | ICD-10-CM | POA: Diagnosis not present

## 2018-10-02 DIAGNOSIS — M545 Low back pain: Secondary | ICD-10-CM | POA: Diagnosis not present

## 2018-10-04 DIAGNOSIS — R5381 Other malaise: Secondary | ICD-10-CM | POA: Diagnosis not present

## 2018-10-04 DIAGNOSIS — M6281 Muscle weakness (generalized): Secondary | ICD-10-CM | POA: Diagnosis not present

## 2018-10-04 DIAGNOSIS — M545 Low back pain: Secondary | ICD-10-CM | POA: Diagnosis not present

## 2018-10-04 DIAGNOSIS — R262 Difficulty in walking, not elsewhere classified: Secondary | ICD-10-CM | POA: Diagnosis not present

## 2018-10-09 ENCOUNTER — Encounter: Payer: Self-pay | Admitting: Cardiovascular Disease

## 2018-10-10 ENCOUNTER — Other Ambulatory Visit: Payer: Self-pay | Admitting: *Deleted

## 2018-10-10 NOTE — Patient Outreach (Signed)
[facility]) Care Management  10/10/2018  Hillery [facility] [birth year] [id number]   Met with Nyoka Cowden, discharge planner. She reports patient plans to discharge home as usual as patient has been at facility for skilled in the past and she is very familiar with his case.   Attempted to meet with patient, staff reports patient went out with his girlfriend. Will attempt to meet and assess for any [facility] CM needs at next facility visit. Royetta Crochet. Laymond Purser, RN, BSN, [place] [phone]) Business Cell  [phone]) Toll Free Office

## 2018-10-12 DIAGNOSIS — M545 Low back pain: Secondary | ICD-10-CM | POA: Diagnosis not present

## 2018-10-12 DIAGNOSIS — R5381 Other malaise: Secondary | ICD-10-CM | POA: Diagnosis not present

## 2018-10-12 DIAGNOSIS — R262 Difficulty in walking, not elsewhere classified: Secondary | ICD-10-CM | POA: Diagnosis not present

## 2018-10-12 DIAGNOSIS — M6281 Muscle weakness (generalized): Secondary | ICD-10-CM | POA: Diagnosis not present

## 2018-10-15 DIAGNOSIS — I5032 Chronic diastolic (congestive) heart failure: Secondary | ICD-10-CM | POA: Diagnosis not present

## 2018-10-15 DIAGNOSIS — G8929 Other chronic pain: Secondary | ICD-10-CM | POA: Diagnosis not present

## 2018-10-15 DIAGNOSIS — F419 Anxiety disorder, unspecified: Secondary | ICD-10-CM | POA: Diagnosis not present

## 2018-10-15 DIAGNOSIS — F332 Major depressive disorder, recurrent severe without psychotic features: Secondary | ICD-10-CM | POA: Diagnosis not present

## 2018-10-15 DIAGNOSIS — Z8673 Personal history of transient ischemic attack (TIA), and cerebral infarction without residual deficits: Secondary | ICD-10-CM | POA: Diagnosis not present

## 2018-10-24 DIAGNOSIS — M7138 Other bursal cyst, other site: Secondary | ICD-10-CM | POA: Diagnosis not present

## 2018-10-24 DIAGNOSIS — I10 Essential (primary) hypertension: Secondary | ICD-10-CM | POA: Diagnosis not present

## 2018-10-24 DIAGNOSIS — Z6830 Body mass index (BMI) 30.0-30.9, adult: Secondary | ICD-10-CM | POA: Diagnosis not present

## 2018-10-25 ENCOUNTER — Other Ambulatory Visit: Payer: Self-pay | Admitting: Neurological Surgery

## 2018-10-25 DIAGNOSIS — M7138 Other bursal cyst, other site: Secondary | ICD-10-CM

## 2018-10-26 DIAGNOSIS — F341 Dysthymic disorder: Secondary | ICD-10-CM | POA: Diagnosis not present

## 2018-10-26 DIAGNOSIS — F419 Anxiety disorder, unspecified: Secondary | ICD-10-CM | POA: Diagnosis not present

## 2018-10-26 DIAGNOSIS — I10 Essential (primary) hypertension: Secondary | ICD-10-CM | POA: Diagnosis not present

## 2018-10-26 DIAGNOSIS — I2510 Atherosclerotic heart disease of native coronary artery without angina pectoris: Secondary | ICD-10-CM | POA: Diagnosis not present

## 2018-10-30 ENCOUNTER — Encounter: Payer: Self-pay | Admitting: Cardiovascular Disease

## 2018-10-30 ENCOUNTER — Ambulatory Visit (INDEPENDENT_AMBULATORY_CARE_PROVIDER_SITE_OTHER): Payer: Medicare Other | Admitting: Cardiovascular Disease

## 2018-10-30 VITALS — BP 100/72 | HR 73 | Ht 70.0 in | Wt 213.0 lb

## 2018-10-30 DIAGNOSIS — I2510 Atherosclerotic heart disease of native coronary artery without angina pectoris: Secondary | ICD-10-CM | POA: Diagnosis not present

## 2018-10-30 NOTE — Patient Instructions (Signed)
Medication Instructions:  Your physician has recommended you make the following change in your medication:   STOP Amlodipine (Norvasc)  If you need a refill on your cardiac medications before your next appointment, please call your pharmacy.    Lab work: None Ordered    Testing/Procedures: None Ordered   Follow-Up: At [facility], you and your health needs are our priority.  As part of our continuing mission to provide you with exceptional heart care, we have created designated Provider Care Teams.  These Care Teams include your primary Cardiologist (physician) and Advanced Practice Providers (APPs -  Physician Assistants and Nurse Practitioners) who all work together to provide you with the care you need, when you need it. You will need a follow up appointment in:   as needed.  Please call our office 2 months in advance to schedule this appointment.  You may see Mertie Moores, MD or one of the following Advanced Practice Providers on your designated Care Team: Richardson Dopp, PA-C [place], [place] . Daune Perch, NP

## 2018-10-30 NOTE — Progress Notes (Signed)
Cardiology Office Note:    Date:  10/30/2018   ID:  Joshua Henson, DOB October 03, 1946, MRN [id number]  PCP:  Clovia Cuff, MD  Cardiologist:  Mertie Moores, MD  Electrophysiologist:  None   Referring MD: Aline August, MD   Chief Complaint  Patient presents with  . Coronary Artery Disease      Joshua Henson is a 72 y.o. male with a hx of alcoholism and mild coronary artery disease.  I last saw him in 2016 for episodes of syncope.  He was admitted in 2017 with an episode of alcohol withdrawal.  He had delirium tremens.  He had mild troponin elevations.  Heart catheterization at that time revealed mild to moderate coronary artery disease. He has a 60 to 70% stenosis and a small mid RCA.  This RCA is nondominant and medical therapy was recommended. Thought to be a relatively poor candidate for coronary stenting  His troponin levels have been persistently elevated throughout the last 3 hospitalizations.  The range has been from 0.7-2.69. All of these admissions were related to alcohol withdrawal with nausea and vomiting and dehydration.  Has not been drinking recently   Was exercising earlier this year. Can walk 3 miles a day without any angina  Has not waked for several months   Past Medical History:  Diagnosis Date  . Acute hepatic encephalopathy   . Alcohol abuse   . Alcoholic cirrhosis of liver without ascites ([place])   . Anxiety state   . CAD (coronary artery disease), native coronary artery 01/10/2016   a. 12/2015 Cath: LM nl, LAD 51m, LCX small, nl, OM2 20, OM4 mod, nl, LPDA nl, RCA 60/73mid - nondominant-->Med Rx.  . Depression   . Diastolic dysfunction    a. 12/2015 Echo: Ef 55%, Gr1 DD; b. 06/2018 Echo: EF 60-65%, no rwma, Gr1 DD.  Marland Kitchen Fatty liver, alcoholic 12/19/7508  . Hypertension   . Increased ammonia level   . Insomnia   . NSTEMI (non-ST elevated myocardial infarction) ([place])    a. 12/2015 elevated trop in setting of etoh w/d and pna->nonobs dzs on cath->Med Rx; b.  06/2018 Elev trop in setting of etoh w/d->Echo w/ nl EF->Med Rx.  . Prostate cancer ([place]) 2010   External beam radiation (urol - Risa Grill, XRT Valere Dross)  . Sinus tachycardia    a. 06/2018 Echo: EF 60-65%.  . Stroke [facility])     Past Surgical History:  Procedure Laterality Date  . CARDIAC CATHETERIZATION N/A 01/15/2016   Procedure: Left Heart Cath and Coronary Angiography;  Surgeon: Burnell Blanks, MD;  Location: [facility];  Service: Cardiovascular;  Laterality: N/A;    Current Medications: Current Meds  Medication Sig  . aspirin 81 MG EC tablet Take 1 tablet (81 mg total) by mouth daily.  Marland Kitchen atorvastatin (LIPITOR) 40 MG tablet Take 1 tablet (40 mg total) by mouth daily at 6 PM.  . buPROPion (WELLBUTRIN SR) 200 MG 12 hr tablet Take 200 mg by mouth daily.  . carvedilol (COREG) 12.5 MG tablet Take 1 tablet (12.5 mg total) by mouth 2 (two) times daily.  . chlordiazePOXIDE (LIBRIUM) 5 MG capsule Take 1 capsule (5 mg total) by mouth 3 (three) times daily as needed for anxiety.  . cholecalciferol (VITAMIN D) 1000 units tablet Take 1,000 Units by mouth 2 (two) times daily.   . Coenzyme Q10 (CO Q-10) 200 MG CAPS Take 200 mg by mouth daily.  Marland Kitchen docusate sodium (COLACE) 100 MG capsule Take 1  capsule (100 mg total) by mouth 2 (two) times daily.  . folic acid (FOLVITE) 1 MG tablet take 1 tablet by mouth once daily  . LORazepam (ATIVAN) 1 MG tablet Take 1 tablet (1 mg total) by mouth 2 (two) times daily as needed for anxiety.  . mirtazapine (REMERON) 15 MG tablet Take 15 mg by mouth at bedtime.  . Multiple Vitamin (MULTIVITAMIN WITH MINERALS) TABS tablet Take 1 tablet by mouth daily.  . nitroGLYCERIN (NITROSTAT) 0.4 MG SL tablet Place 1 tablet (0.4 mg total) under the tongue every 5 (five) minutes as needed for chest pain.  . Omega-3 Fatty Acids (FISH OIL) 1000 MG CAPS Take 1,000 mg by mouth 2 (two) times daily.   . ondansetron (ZOFRAN) 4 MG tablet Take 1 tablet (4 mg total) by mouth every 6  (six) hours as needed for nausea.  . polyethylene glycol (MIRALAX / GLYCOLAX) packet Take 17 g by mouth daily.  . QUEtiapine (SEROQUEL) 25 MG tablet Take 1 tablet (25 mg total) by mouth daily before breakfast.  . tamsulosin (FLOMAX) 0.4 MG CAPS capsule Take 1 capsule (0.4 mg total) by mouth daily.  Marland Kitchen thiamine (VITAMIN B-1) 100 MG tablet Take 100 mg by mouth daily.  . traMADol (ULTRAM) 50 MG tablet Take 1 tablet (50 mg total) by mouth every 6 (six) hours as needed for severe pain.  Marland Kitchen triamterene-hydrochlorothiazide (DYAZIDE) 37.5-25 MG capsule Take 1 capsule by mouth daily.  . [DISCONTINUED] amLODipine (NORVASC) 10 MG tablet Take 1 tablet (10 mg total) by mouth daily.     Allergies:   Lisinopril and Celecoxib   Social History   Socioeconomic History  . Marital status: Divorced    Spouse name: Not on file  . Number of children: 3  . Years of education: 4  . Highest education level: Not on file  Occupational History  . Occupation: Chief Executive Officer  Social Needs  . Financial resource strain: Not on file  . Food insecurity:    Worry: Not on file    Inability: Not on file  . Transportation needs:    Medical: Not on file    Non-medical: Not on file  Tobacco Use  . Smoking status: Current Every Day Smoker    Packs/day: 0.50    Years: 30.00    Pack years: 15.00    Types: Cigarettes  . Smokeless tobacco: Never Used  Substance and Sexual Activity  . Alcohol use: Yes    Alcohol/week: 56.0 standard drinks    Types: 56 Shots of liquor per week    Comment: last drink yesterday  . Drug use: Yes    Types: Marijuana    Comment: smokes intermittently.  Marland Kitchen Sexual activity: Yes  Lifestyle  . Physical activity:    Days per week: Not on file    Minutes per session: Not on file  . Stress: Not on file  Relationships  . Social connections:    Talks on phone: Not on file    Gets together: Not on file    Attends religious service: Not on file    Active member of club or organization: Not on file     Attends meetings of clubs or organizations: Not on file    Relationship status: Not on file  Other Topics Concern  . Not on file  Social History Narrative   HSG, [employer], [place]. Married 22 yrs yrs - divorced. Twin boys - 45; 1 dtr - '94. Pension scheme manager. Lives in [place] with long  term girlfriend.  Does not routinely exercise.     Family History: The patient's family history includes Diabetes in his mother; Esophageal cancer (age of onset: 57) in his mother; Heart attack in his maternal grandfather; Heart disease in his maternal grandfather; Hypertension in his mother; Prostate cancer in his paternal grandfather; Prostate cancer (age of onset: 33) in his father.  ROS:   Please see the history of present illness.     All other systems reviewed and are negative.  EKGs/Labs/Other Studies Reviewed:    The following studies were reviewed today:   EKG:     Recent Labs: 09/26/2018: ALT 35; Hemoglobin 12.9; Platelets 159 09/27/2018: Magnesium 1.7 09/28/2018: BUN 11; Creatinine, Ser 0.97; Potassium 3.4; Sodium 142  Recent Lipid Panel    Component Value Date/Time   CHOL 145 07/01/2018 0830   TRIG 79 07/01/2018 0830   HDL 66 07/01/2018 0830   CHOLHDL 2.2 07/01/2018 0830   VLDL 16 07/01/2018 0830   LDLCALC 63 07/01/2018 0830    Physical Exam:    VS:  BP 100/72   Pulse 73   Ht 5\' 10"  (1.778 m)   Wt 213 lb (96.6 kg)   SpO2 96%   BMI 30.56 kg/m     Wt Readings from Last 3 Encounters:  10/30/18 213 lb (96.6 kg)  09/28/18 210 lb 5.1 oz (95.4 kg)  07/01/18 205 lb 0.4 oz (93 kg)     GEN:  Elderly man.   Appears older than stated age.  HEENT: Normal NECK: No JVD; No carotid bruits LYMPHATICS: No lymphadenopathy CARDIAC:  RR  RESPIRATORY:  Clear to auscultation without rales, wheezing or rhonchi  ABDOMEN: Soft, non-tender, non-distended MUSCULOSKELETAL:  No edema; No deformity  SKIN: Warm and dry NEUROLOGIC:  Alert and oriented x 3 PSYCHIATRIC:  Normal  affect   ASSESSMENT:    1. Coronary artery disease involving native coronary artery of native heart without angina pectoris    PLAN:    In order of problems listed above:  1.  CAD :    Has mild - mod CAD.   Has had several admissions for ETOH withdrawal / dehdration  He has not had any episodes of angina.  I have encouraged him to get up and walk on a regular basis.  He is not drinking right now when I encouraged him to continue to take good care of himself. He will see his medical doctor for follow-up of his hyperlipidemia.  They will also need to measure liver enzymes.  2.  Hypertension: The patient's blood pressure is borderline low today.  He has not been taking his amlodipine.  I will discontinue the amlodipine at this time because he clearly does not need it right now.  I suspect that he does become hypertensive when he withdrawals but he certainly does not need the amlodipine on a regular basis.   Medication Adjustments/Labs and Tests Ordered: Current medicines are reviewed at length with the patient today.  Concerns regarding medicines are outlined above.  No orders of the defined types were placed in this encounter.  No orders of the defined types were placed in this encounter.   Patient Instructions  Medication Instructions:  Your physician has recommended you make the following change in your medication:   STOP Amlodipine (Norvasc)  If you need a refill on your cardiac medications before your next appointment, please call your pharmacy.    Lab work: None Ordered    Testing/Procedures: None Ordered   Follow-Up: At  [facility], you and your health needs are our priority.  As part of our continuing mission to provide you with exceptional heart care, we have created designated Provider Care Teams.  These Care Teams include your primary Cardiologist (physician) and Advanced Practice Providers (APPs -  Physician Assistants and Nurse Practitioners) who all work  together to provide you with the care you need, when you need it. You will need a follow up appointment in:   as needed.  Please call our office 2 months in advance to schedule this appointment.  You may see Mertie Moores, MD or one of the following Advanced Practice Providers on your designated Care Team: Richardson Dopp, PA-C [place], [place] . Daune Perch, NP       Signed, Mertie Moores, MD  10/30/2018 5:34 PM    [place]

## 2018-12-25 ENCOUNTER — Ambulatory Visit
Admission: RE | Admit: 2018-12-25 | Discharge: 2018-12-25 | Disposition: A | Discharge status: Home / Self Care | Payer: Medicare Other | Source: Ambulatory Visit | Attending: Neurological Surgery | Admitting: Neurological Surgery

## 2018-12-25 DIAGNOSIS — M7138 Other bursal cyst, other site: Secondary | ICD-10-CM

## 2020-02-01 DIAGNOSIS — E0840 Diabetes mellitus due to underlying condition with diabetic neuropathy, unspecified: Secondary | ICD-10-CM | POA: Diagnosis not present

## 2020-02-01 DIAGNOSIS — B351 Tinea unguium: Secondary | ICD-10-CM | POA: Diagnosis not present

## 2020-02-01 DIAGNOSIS — M2041 Other hammer toe(s) (acquired), right foot: Secondary | ICD-10-CM | POA: Diagnosis not present

## 2020-03-07 DIAGNOSIS — G629 Polyneuropathy, unspecified: Secondary | ICD-10-CM | POA: Diagnosis not present

## 2020-03-07 DIAGNOSIS — G894 Chronic pain syndrome: Secondary | ICD-10-CM | POA: Diagnosis not present

## 2020-03-07 DIAGNOSIS — F419 Anxiety disorder, unspecified: Secondary | ICD-10-CM | POA: Diagnosis not present

## 2020-03-07 DIAGNOSIS — E782 Mixed hyperlipidemia: Secondary | ICD-10-CM | POA: Diagnosis not present

## 2020-03-07 DIAGNOSIS — E785 Hyperlipidemia, unspecified: Secondary | ICD-10-CM | POA: Diagnosis not present

## 2020-05-23 DIAGNOSIS — F1320 Sedative, hypnotic or anxiolytic dependence, uncomplicated: Secondary | ICD-10-CM | POA: Diagnosis not present

## 2020-05-23 DIAGNOSIS — F419 Anxiety disorder, unspecified: Secondary | ICD-10-CM | POA: Diagnosis not present

## 2020-05-23 DIAGNOSIS — I10 Essential (primary) hypertension: Secondary | ICD-10-CM | POA: Diagnosis not present

## 2020-05-23 DIAGNOSIS — E785 Hyperlipidemia, unspecified: Secondary | ICD-10-CM | POA: Diagnosis not present

## 2020-05-23 DIAGNOSIS — G894 Chronic pain syndrome: Secondary | ICD-10-CM | POA: Diagnosis not present

## 2020-09-18 DIAGNOSIS — M109 Gout, unspecified: Secondary | ICD-10-CM | POA: Diagnosis not present

## 2020-09-18 DIAGNOSIS — F419 Anxiety disorder, unspecified: Secondary | ICD-10-CM | POA: Diagnosis not present

## 2020-09-18 DIAGNOSIS — F39 Unspecified mood [affective] disorder: Secondary | ICD-10-CM | POA: Diagnosis not present

## 2020-09-18 DIAGNOSIS — F1320 Sedative, hypnotic or anxiolytic dependence, uncomplicated: Secondary | ICD-10-CM | POA: Diagnosis not present

## 2020-09-18 DIAGNOSIS — I10 Essential (primary) hypertension: Secondary | ICD-10-CM | POA: Diagnosis not present

## 2020-11-07 DIAGNOSIS — I10 Essential (primary) hypertension: Secondary | ICD-10-CM | POA: Diagnosis not present

## 2020-11-07 DIAGNOSIS — F419 Anxiety disorder, unspecified: Secondary | ICD-10-CM | POA: Diagnosis not present

## 2020-11-07 DIAGNOSIS — F341 Dysthymic disorder: Secondary | ICD-10-CM | POA: Diagnosis not present

## 2020-11-07 DIAGNOSIS — M25571 Pain in right ankle and joints of right foot: Secondary | ICD-10-CM | POA: Diagnosis not present

## 2020-11-08 DIAGNOSIS — M79671 Pain in right foot: Secondary | ICD-10-CM | POA: Diagnosis not present

## 2020-11-08 DIAGNOSIS — M25571 Pain in right ankle and joints of right foot: Secondary | ICD-10-CM | POA: Diagnosis not present

## 2021-01-13 DIAGNOSIS — M25571 Pain in right ankle and joints of right foot: Secondary | ICD-10-CM | POA: Diagnosis not present

## 2021-01-13 DIAGNOSIS — M79671 Pain in right foot: Secondary | ICD-10-CM | POA: Diagnosis not present

## 2021-02-01 DIAGNOSIS — I6350 Cerebral infarction due to unspecified occlusion or stenosis of unspecified cerebral artery: Secondary | ICD-10-CM | POA: Diagnosis not present

## 2021-02-01 DIAGNOSIS — I5032 Chronic diastolic (congestive) heart failure: Secondary | ICD-10-CM | POA: Diagnosis not present

## 2021-02-01 DIAGNOSIS — Z720 Tobacco use: Secondary | ICD-10-CM | POA: Diagnosis not present

## 2021-02-01 DIAGNOSIS — G894 Chronic pain syndrome: Secondary | ICD-10-CM | POA: Diagnosis not present

## 2021-02-01 DIAGNOSIS — L989 Disorder of the skin and subcutaneous tissue, unspecified: Secondary | ICD-10-CM | POA: Diagnosis not present

## 2021-02-01 DIAGNOSIS — I10 Essential (primary) hypertension: Secondary | ICD-10-CM | POA: Diagnosis not present

## 2021-02-17 DIAGNOSIS — L820 Inflamed seborrheic keratosis: Secondary | ICD-10-CM | POA: Diagnosis not present

## 2021-02-17 DIAGNOSIS — L821 Other seborrheic keratosis: Secondary | ICD-10-CM | POA: Diagnosis not present

## 2021-04-13 ENCOUNTER — Other Ambulatory Visit: Payer: Self-pay

## 2021-04-13 ENCOUNTER — Emergency Department (HOSPITAL_COMMUNITY): Payer: PPO

## 2021-04-13 ENCOUNTER — Inpatient Hospital Stay (HOSPITAL_COMMUNITY)
Admission: EM | Admit: 2021-04-13 | Discharge: 2021-04-19 | DRG: 917 | Disposition: A | Discharge status: Home / Self Care | Payer: PPO | Attending: Family Medicine | Admitting: Family Medicine

## 2021-04-13 DIAGNOSIS — T402X4A Poisoning by other opioids, undetermined, initial encounter: Secondary | ICD-10-CM | POA: Diagnosis not present

## 2021-04-13 DIAGNOSIS — I2510 Atherosclerotic heart disease of native coronary artery without angina pectoris: Secondary | ICD-10-CM | POA: Diagnosis present

## 2021-04-13 DIAGNOSIS — I5032 Chronic diastolic (congestive) heart failure: Secondary | ICD-10-CM | POA: Diagnosis present

## 2021-04-13 DIAGNOSIS — I712 Thoracic aortic aneurysm, without rupture: Secondary | ICD-10-CM | POA: Diagnosis present

## 2021-04-13 DIAGNOSIS — I499 Cardiac arrhythmia, unspecified: Secondary | ICD-10-CM | POA: Diagnosis not present

## 2021-04-13 DIAGNOSIS — E872 Acidosis: Secondary | ICD-10-CM | POA: Diagnosis not present

## 2021-04-13 DIAGNOSIS — Z923 Personal history of irradiation: Secondary | ICD-10-CM

## 2021-04-13 DIAGNOSIS — T402X1A Poisoning by other opioids, accidental (unintentional), initial encounter: Principal | ICD-10-CM | POA: Diagnosis present

## 2021-04-13 DIAGNOSIS — J690 Pneumonitis due to inhalation of food and vomit: Secondary | ICD-10-CM | POA: Diagnosis not present

## 2021-04-13 DIAGNOSIS — Z8042 Family history of malignant neoplasm of prostate: Secondary | ICD-10-CM

## 2021-04-13 DIAGNOSIS — F1910 Other psychoactive substance abuse, uncomplicated: Secondary | ICD-10-CM | POA: Diagnosis present

## 2021-04-13 DIAGNOSIS — Y92009 Unspecified place in unspecified non-institutional (private) residence as the place of occurrence of the external cause: Secondary | ICD-10-CM

## 2021-04-13 DIAGNOSIS — Z800 Family history of malignant neoplasm of digestive organs: Secondary | ICD-10-CM

## 2021-04-13 DIAGNOSIS — H8109 Meniere's disease, unspecified ear: Secondary | ICD-10-CM | POA: Diagnosis present

## 2021-04-13 DIAGNOSIS — Z20822 Contact with and (suspected) exposure to covid-19: Secondary | ICD-10-CM | POA: Diagnosis not present

## 2021-04-13 DIAGNOSIS — F17210 Nicotine dependence, cigarettes, uncomplicated: Secondary | ICD-10-CM | POA: Diagnosis not present

## 2021-04-13 DIAGNOSIS — I110 Hypertensive heart disease with heart failure: Secondary | ICD-10-CM | POA: Diagnosis not present

## 2021-04-13 DIAGNOSIS — Z4682 Encounter for fitting and adjustment of non-vascular catheter: Secondary | ICD-10-CM | POA: Diagnosis not present

## 2021-04-13 DIAGNOSIS — R7989 Other specified abnormal findings of blood chemistry: Secondary | ICD-10-CM | POA: Diagnosis present

## 2021-04-13 DIAGNOSIS — R0689 Other abnormalities of breathing: Secondary | ICD-10-CM | POA: Diagnosis not present

## 2021-04-13 DIAGNOSIS — Z8546 Personal history of malignant neoplasm of prostate: Secondary | ICD-10-CM

## 2021-04-13 DIAGNOSIS — Z8673 Personal history of transient ischemic attack (TIA), and cerebral infarction without residual deficits: Secondary | ICD-10-CM

## 2021-04-13 DIAGNOSIS — Z452 Encounter for adjustment and management of vascular access device: Secondary | ICD-10-CM | POA: Diagnosis not present

## 2021-04-13 DIAGNOSIS — S3993XA Unspecified injury of pelvis, initial encounter: Secondary | ICD-10-CM | POA: Diagnosis not present

## 2021-04-13 DIAGNOSIS — I6782 Cerebral ischemia: Secondary | ICD-10-CM | POA: Diagnosis not present

## 2021-04-13 DIAGNOSIS — E876 Hypokalemia: Secondary | ICD-10-CM | POA: Diagnosis present

## 2021-04-13 DIAGNOSIS — G319 Degenerative disease of nervous system, unspecified: Secondary | ICD-10-CM | POA: Diagnosis not present

## 2021-04-13 DIAGNOSIS — Z8249 Family history of ischemic heart disease and other diseases of the circulatory system: Secondary | ICD-10-CM

## 2021-04-13 DIAGNOSIS — Z833 Family history of diabetes mellitus: Secondary | ICD-10-CM

## 2021-04-13 DIAGNOSIS — F4323 Adjustment disorder with mixed anxiety and depressed mood: Secondary | ICD-10-CM

## 2021-04-13 DIAGNOSIS — N179 Acute kidney failure, unspecified: Secondary | ICD-10-CM | POA: Diagnosis present

## 2021-04-13 DIAGNOSIS — Z79899 Other long term (current) drug therapy: Secondary | ICD-10-CM

## 2021-04-13 DIAGNOSIS — I252 Old myocardial infarction: Secondary | ICD-10-CM | POA: Diagnosis not present

## 2021-04-13 DIAGNOSIS — J9602 Acute respiratory failure with hypercapnia: Secondary | ICD-10-CM | POA: Diagnosis present

## 2021-04-13 DIAGNOSIS — F411 Generalized anxiety disorder: Secondary | ICD-10-CM | POA: Diagnosis present

## 2021-04-13 DIAGNOSIS — I469 Cardiac arrest, cause unspecified: Secondary | ICD-10-CM | POA: Diagnosis not present

## 2021-04-13 DIAGNOSIS — K700 Alcoholic fatty liver: Secondary | ICD-10-CM | POA: Diagnosis present

## 2021-04-13 DIAGNOSIS — K7030 Alcoholic cirrhosis of liver without ascites: Secondary | ICD-10-CM | POA: Diagnosis not present

## 2021-04-13 DIAGNOSIS — I491 Atrial premature depolarization: Secondary | ICD-10-CM | POA: Diagnosis not present

## 2021-04-13 DIAGNOSIS — F1410 Cocaine abuse, uncomplicated: Secondary | ICD-10-CM | POA: Diagnosis not present

## 2021-04-13 DIAGNOSIS — Z888 Allergy status to other drugs, medicaments and biological substances status: Secondary | ICD-10-CM

## 2021-04-13 DIAGNOSIS — R404 Transient alteration of awareness: Secondary | ICD-10-CM | POA: Diagnosis not present

## 2021-04-13 DIAGNOSIS — G4700 Insomnia, unspecified: Secondary | ICD-10-CM | POA: Diagnosis present

## 2021-04-13 DIAGNOSIS — G9341 Metabolic encephalopathy: Secondary | ICD-10-CM | POA: Diagnosis present

## 2021-04-13 DIAGNOSIS — M160 Bilateral primary osteoarthritis of hip: Secondary | ICD-10-CM | POA: Diagnosis not present

## 2021-04-13 DIAGNOSIS — Z7982 Long term (current) use of aspirin: Secondary | ICD-10-CM

## 2021-04-13 DIAGNOSIS — J9601 Acute respiratory failure with hypoxia: Secondary | ICD-10-CM | POA: Diagnosis not present

## 2021-04-13 DIAGNOSIS — T50901A Poisoning by unspecified drugs, medicaments and biological substances, accidental (unintentional), initial encounter: Secondary | ICD-10-CM | POA: Diagnosis not present

## 2021-04-13 DIAGNOSIS — I959 Hypotension, unspecified: Secondary | ICD-10-CM | POA: Diagnosis present

## 2021-04-13 DIAGNOSIS — R4182 Altered mental status, unspecified: Secondary | ICD-10-CM | POA: Diagnosis not present

## 2021-04-13 LAB — I-STAT CHEM 8, ED
BUN: 21 mg/dL (ref 8–23)
Calcium, Ion: 1.22 mmol/L (ref 1.15–1.40)
Chloride: 107 mmol/L (ref 98–111)
Creatinine, Ser: 1.5 mg/dL — ABNORMAL HIGH (ref 0.61–1.24)
Glucose, Bld: 169 mg/dL — ABNORMAL HIGH (ref 70–99)
HCT: 44 % (ref 39.0–52.0)
Hemoglobin: 15 g/dL (ref 13.0–17.0)
Potassium: 4.4 mmol/L (ref 3.5–5.1)
Sodium: 142 mmol/L (ref 135–145)
TCO2: 27 mmol/L (ref 22–32)

## 2021-04-13 LAB — I-STAT ARTERIAL BLOOD GAS, ED
Acid-base deficit: 4 mmol/L — ABNORMAL HIGH (ref 0.0–2.0)
Bicarbonate: 23.8 mmol/L (ref 20.0–28.0)
Calcium, Ion: 1.23 mmol/L (ref 1.15–1.40)
HCT: 37 % — ABNORMAL LOW (ref 39.0–52.0)
Hemoglobin: 12.6 g/dL — ABNORMAL LOW (ref 13.0–17.0)
O2 Saturation: 97 %
Patient temperature: 95.8
Potassium: 3.7 mmol/L (ref 3.5–5.1)
Sodium: 143 mmol/L (ref 135–145)
TCO2: 25 mmol/L (ref 22–32)
pCO2 arterial: 51.2 mmHg — ABNORMAL HIGH (ref 32.0–48.0)
pH, Arterial: 7.267 — ABNORMAL LOW (ref 7.350–7.450)
pO2, Arterial: 103 mmHg (ref 83.0–108.0)

## 2021-04-13 LAB — I-STAT VENOUS BLOOD GAS, ED
Acid-base deficit: 6 mmol/L — ABNORMAL HIGH (ref 0.0–2.0)
Bicarbonate: 26.1 mmol/L (ref 20.0–28.0)
Calcium, Ion: 1.22 mmol/L (ref 1.15–1.40)
HCT: 45 % (ref 39.0–52.0)
Hemoglobin: 15.3 g/dL (ref 13.0–17.0)
O2 Saturation: 65 %
Potassium: 4.4 mmol/L (ref 3.5–5.1)
Sodium: 142 mmol/L (ref 135–145)
TCO2: 29 mmol/L (ref 22–32)
pCO2, Ven: 85.9 mmHg (ref 44.0–60.0)
pH, Ven: 7.09 — CL (ref 7.250–7.430)
pO2, Ven: 48 mmHg — ABNORMAL HIGH (ref 32.0–45.0)

## 2021-04-13 LAB — RESP PANEL BY RT-PCR (FLU A&B, COVID) ARPGX2
Influenza A by PCR: NEGATIVE
Influenza B by PCR: NEGATIVE
SARS Coronavirus 2 by RT PCR: NEGATIVE

## 2021-04-13 MED ORDER — NALOXONE HCL 2 MG/2ML IJ SOSY
PREFILLED_SYRINGE | INTRAMUSCULAR | Status: AC
  Administered 2021-04-13: 2 mg via INTRAVENOUS
  Filled 2021-04-13: qty 2

## 2021-04-13 MED ORDER — ROCURONIUM BROMIDE 50 MG/5ML IV SOLN
INTRAVENOUS | Status: AC | PRN
  Administered 2021-04-13: 80 mg via INTRAVENOUS

## 2021-04-13 MED ORDER — SODIUM CHLORIDE 0.9 % IV SOLN
3.0000 g | Freq: Once | INTRAVENOUS | Status: AC
  Administered 2021-04-14: 3 g via INTRAVENOUS
  Filled 2021-04-13: qty 8

## 2021-04-13 MED ORDER — SODIUM CHLORIDE 0.9 % IV SOLN
INTRAVENOUS | Status: AC | PRN
  Administered 2021-04-13: 1000 mL via INTRAVENOUS

## 2021-04-13 MED ORDER — NALOXONE HCL 2 MG/2ML IJ SOSY: 2.0000 mg | PREFILLED_SYRINGE | Freq: Once | INTRAMUSCULAR | Status: AC

## 2021-04-13 MED ORDER — POLYETHYLENE GLYCOL 3350 17 G PO PACK: 17.0000 g | PACK | Freq: Every day | ORAL | Status: DC | PRN

## 2021-04-13 MED ORDER — FENTANYL 2500MCG IN NS 250ML (10MCG/ML) PREMIX INFUSION
25.0000 ug/h | INTRAVENOUS | Status: DC
  Administered 2021-04-14: 25 ug/h via INTRAVENOUS
  Filled 2021-04-13: qty 250

## 2021-04-13 MED ORDER — DOCUSATE SODIUM 50 MG/5ML PO LIQD
100.0000 mg | Freq: Two times a day (BID) | ORAL | Status: DC | PRN
  Filled 2021-04-13: qty 10

## 2021-04-13 MED ORDER — LACTATED RINGERS IV SOLN: INTRAVENOUS | Status: DC

## 2021-04-13 MED ORDER — PROPOFOL 1000 MG/100ML IV EMUL
INTRAVENOUS | Status: DC | PRN
  Administered 2021-04-13: 5 ug/kg/min via INTRAVENOUS

## 2021-04-13 MED ORDER — ETOMIDATE 2 MG/ML IV SOLN
INTRAVENOUS | Status: AC | PRN
  Administered 2021-04-13: 25 mg via INTRAVENOUS

## 2021-04-13 MED ORDER — FENTANYL CITRATE (PF) 100 MCG/2ML IJ SOLN
25.0000 ug | Freq: Once | INTRAMUSCULAR | Status: AC
Start: 2021-04-14 — End: 2021-04-14
  Administered 2021-04-14: 25 ug via INTRAVENOUS
  Filled 2021-04-13: qty 2

## 2021-04-13 MED ORDER — SODIUM CHLORIDE 0.9 % IV BOLUS
1000.0000 mL | Freq: Once | INTRAVENOUS | Status: AC
  Administered 2021-04-13: 1000 mL via INTRAVENOUS

## 2021-04-13 MED ORDER — FENTANYL BOLUS VIA INFUSION
25.0000 ug | INTRAVENOUS | Status: DC | PRN
  Administered 2021-04-14: 25 ug via INTRAVENOUS
  Filled 2021-04-13: qty 100

## 2021-04-13 MED ORDER — SODIUM CHLORIDE 0.9 % IV SOLN
2.0000 g | Freq: Every day | INTRAVENOUS | Status: DC
  Administered 2021-04-14 – 2021-04-15 (×3): 2 g via INTRAVENOUS
  Filled 2021-04-13 (×4): qty 20

## 2021-04-13 MED ORDER — PROPOFOL 1000 MG/100ML IV EMUL
INTRAVENOUS | Status: AC
  Filled 2021-04-13: qty 100

## 2021-04-13 MED ORDER — MIDAZOLAM 50MG/50ML (1MG/ML) PREMIX INFUSION
0.5000 mg/h | INTRAVENOUS | Status: DC
  Administered 2021-04-14: 1 mg/h via INTRAVENOUS
  Administered 2021-04-14: 0.5 mg/h via INTRAVENOUS
  Filled 2021-04-13 (×2): qty 50

## 2021-04-13 MED ORDER — HEPARIN SODIUM (PORCINE) 5000 UNIT/ML IJ SOLN
5000.0000 [IU] | Freq: Three times a day (TID) | INTRAMUSCULAR | Status: DC
  Administered 2021-04-14 – 2021-04-19 (×16): 5000 [IU] via SUBCUTANEOUS
  Filled 2021-04-13 (×16): qty 1

## 2021-04-13 NOTE — ED Notes (Signed)
Family at bedside, dr Langston Masker updating family.

## 2021-04-13 NOTE — Code Documentation (Addendum)
Pt not tolerating bipap at this time, venous blood gas critical. Dr Langston Masker, respiratory, and pharmacy at bedside for intubation.

## 2021-04-13 NOTE — ED Notes (Signed)
Pt responding after narcan, NRB in place.

## 2021-04-13 NOTE — Code Documentation (Signed)
Dr Langston Masker at head of bed intubating pt.

## 2021-04-13 NOTE — ED Triage Notes (Addendum)
Per [facility], pt comng from home with unwitnessed arrest, white powder noted to table, pin point pupils. PEA AND CPR initiated for 15 min. 3 epi provided, pt now has a pulse attempted intubation but pt had gag reflex, pt arrived with a king airway. End tidal 70-100. Initial rhythm pea 60, after rosc sinus rhythm hr 90. Bilateral 18g AC. Marland Kitchen

## 2021-04-13 NOTE — Code Documentation (Signed)
Xray at bedside post intubation

## 2021-04-13 NOTE — ED Notes (Addendum)
admitting at bedside.  

## 2021-04-13 NOTE — ED Provider Notes (Signed)
[facility] EMERGENCY DEPARTMENT Provider Note   CSN: [id number] Arrival date & time: 03/26/2021  2231     History Chief Complaint  Patient presents with  . Cardiac Arrest  . Drug Overdose    Joshua Henson is a 75 y.o. male with a history of chronic alcohol use, on Ativan, presenting to emergency department status post PEA arrest.  History initially provided by paramedics, subsequently by the patient's wife at bedside.  Patient's wife reports has been out of town for the past 9 days.  She returned and found the patient sleeping on the couch.  However she could not wake him up and then called 911.  EMS reports that the patient was in PEA arrest on their arrival.  These are approximately 20 minutes of CPR.  The patient received few rounds of epi, and had a King airway placed.  Subsequently achieved ROSC in the field.  He was brought to the ED.  The patient arrives with bag ventilation, unresponsive.  His wife reports to me that he does have a history of opioid and narcotic abuse many years ago.  She says she is unaware that he has been using anything recently.  The paramedics and EMS reports that there was white powder present on a kitchen counter next the patient, with a credit card used to chop in the lines, no strong next to it.  Police officers present on his arrival informed me that they believe that this may be Fentanyl, although it has not been confirmed yet.  The patient's wife also tells me that he has a history of severe alcohol withdrawal requiring intubation.  He has been on a very slow Ativan taper for many months.  He is currently taking 0.5 mg of Ativan twice a day.  HPI     Past Medical History:  Diagnosis Date  . Acute hepatic encephalopathy   . Alcohol abuse   . Alcoholic cirrhosis of liver without ascites ([place])   . Anxiety state   . CAD (coronary artery disease), native coronary artery 01/10/2016   a. 12/2015 Cath: LM nl, LAD 35m, LCX small, nl, OM2 20,  OM4 mod, nl, LPDA nl, RCA 60/60mid - nondominant-->Med Rx.  . Depression   . Diastolic dysfunction    a. 12/2015 Echo: Ef 55%, Gr1 DD; b. 06/2018 Echo: EF 60-65%, no rwma, Gr1 DD.  Marland Kitchen Fatty liver, alcoholic [id number]  . Hypertension   . Increased ammonia level   . Insomnia   . NSTEMI (non-ST elevated myocardial infarction) ([place])    a. 12/2015 elevated trop in setting of etoh w/d and pna->nonobs dzs on cath->Med Rx; b. 06/2018 Elev trop in setting of etoh w/d->Echo w/ nl EF->Med Rx.  . Prostate cancer ([place]) 2010   External beam radiation (urol - Risa Grill, XRT Valere Dross)  . Sinus tachycardia    a. 06/2018 Echo: EF 60-65%.  . Stroke [facility])     Patient Active Problem List   Diagnosis Date Noted  . Cardiac arrest ([place]) 04/09/2021  . Alcohol dependence with acute alcoholic intoxication, with unspecified complication ([place]) [id number]  . Abdominal pain 08/03/2018  . Chronic diastolic CHF (congestive heart failure) ([place]) 08/03/2018  . Anxiety 08/03/2018  . Nausea and vomiting 07/01/2018  . Elevated troponin 06/30/2018  . Altered mental status 11/20/2017  . LFT elevation   . Alcoholic ketosis ([place])   . Dehydration   . Alcohol withdrawal ([place]) 11/09/2017  . Alcohol-induced acute pancreatitis   . Hematemesis 02/15/2017  .  Acute pancreatitis 02/15/2017  . Alcohol dependence with unspecified alcohol-induced disorder ([place])   . Alcohol withdrawal delirium ([place]) 09/13/2016  . C. difficile colitis 03/30/2016  . Essential hypertension 03/23/2016  . Fall   . Tobacco abuse   . Hypokalemia   . CAD (coronary artery disease)   . Cocaine abuse ([place])   . Frequent falls 01/11/2016  . Left rib fracture 01/11/2016  . Acute encephalopathy 01/11/2016  . Polysubstance abuse ([place]) 01/11/2016  . Delirium tremens ([place]) 01/11/2016  . NSTEMI (non-ST elevated myocardial infarction) ([place]) 01/11/2016  . Sepsis due to Enterococcus with acute renal failure and metabolic encephalopathy ([place]) 01/11/2016  . Chronic  alcohol abuse   . CAD (coronary artery disease), native coronary artery 01/10/2016  . Fatty liver, alcoholic 69/67/8938  . Hypomagnesemia 04/03/2015  . Ataxia   . Acute respiratory failure ([place]) 09/05/2014  . Alcohol withdrawal, with delirium ([place]) 09/03/2014  . Unspecified cerebral artery occlusion with cerebral infarction 07/05/2014  . Alcohol abuse 07/02/2014  . Current tobacco use 01/17/2014  . Hearing loss 11/25/2013  . Tinnitus of both ears 11/25/2013    Past Surgical History:  Procedure Laterality Date  . CARDIAC CATHETERIZATION N/A 01/15/2016   Procedure: Left Heart Cath and Coronary Angiography;  Surgeon: Burnell Blanks, MD;  Location: [facility];  Service: Cardiovascular;  Laterality: N/A;       Family History  Problem Relation Age of Onset  . Hypertension Mother   . Diabetes Mother   . Esophageal cancer Mother 62  . Prostate cancer Father 62  . Heart disease Maternal Grandfather        MI  . Heart attack Maternal Grandfather   . Prostate cancer Paternal Grandfather     Social History   Tobacco Use  . Smoking status: Current Every Day Smoker    Packs/day: 0.50    Years: 30.00    Pack years: 15.00    Types: Cigarettes  . Smokeless tobacco: Never Used  Vaping Use  . Vaping Use: Never used  Substance Use Topics  . Alcohol use: Yes    Alcohol/week: 56.0 standard drinks    Types: 56 Shots of liquor per week    Comment: last drink yesterday  . Drug use: Yes    Types: Marijuana    Comment: smokes intermittently.    Home Medications Prior to Admission medications   Medication Sig Start Date End Date Taking? Authorizing Provider  aspirin 81 MG EC tablet Take 1 tablet (81 mg total) by mouth daily. 07/11/14   Angiulli, Lavon Paganini, PA-C  atorvastatin (LIPITOR) 40 MG tablet Take 1 tablet (40 mg total) by mouth daily at 6 PM. 01/19/16   Allie Bossier, MD  buPROPion [facility] SR) 200 MG 12 hr tablet Take 200 mg by mouth daily.    [provider]  carvedilol (COREG) 12.5 MG tablet Take 1 tablet (12.5 mg total) by mouth 2 (two) times daily. 02/23/17   Florencia Reasons, MD  chlordiazePOXIDE (LIBRIUM) 5 MG capsule Take 1 capsule (5 mg total) by mouth 3 (three) times daily as needed for anxiety. 09/29/18   Georgette Shell, MD  cholecalciferol (VITAMIN D) 1000 units tablet Take 1,000 Units by mouth 2 (two) times daily.     [provider]  Coenzyme Q10 (CO Q-10) 200 MG CAPS Take 200 mg by mouth daily.    [provider]  docusate sodium (COLACE) 100 MG capsule Take 1 capsule (100 mg total) by mouth 2 (two) times  daily. 09/29/18   Georgette Shell, MD  folic acid (FOLVITE) 1 MG tablet take 1 tablet by mouth once daily 01/12/17   Medina-Vargas, Monina C, NP  LORazepam (ATIVAN) 1 MG tablet Take 1 tablet (1 mg total) by mouth 2 (two) times daily as needed for anxiety. 08/04/18   Aline August, MD  mirtazapine (REMERON) 15 MG tablet Take 15 mg by mouth at bedtime.    [provider]  Multiple Vitamin (MULTIVITAMIN WITH MINERALS) TABS tablet Take 1 tablet by mouth daily. 08/04/18   Aline August, MD  nitroGLYCERIN (NITROSTAT) 0.4 MG SL tablet Place 1 tablet (0.4 mg total) under the tongue every 5 (five) minutes as needed for chest pain. 05/13/16   Velvet Bathe, MD  Omega-3 Fatty Acids (FISH OIL) 1000 MG CAPS Take 1,000 mg by mouth 2 (two) times daily.     [provider]  ondansetron (ZOFRAN) 4 MG tablet Take 1 tablet (4 mg total) by mouth every 6 (six) hours as needed for nausea. 09/29/18   Georgette Shell, MD  polyethylene glycol [facility] Floria Raveling) packet Take 17 g by mouth daily. 07/04/18   Kayleen Memos, DO  QUEtiapine (SEROQUEL) 25 MG tablet Take 1 tablet (25 mg total) by mouth daily before breakfast. 11/22/17   Charlynne Cousins, MD  tamsulosin (FLOMAX) 0.4 MG CAPS capsule Take 1 capsule (0.4 mg total) by mouth daily. 07/03/18   Kayleen Memos, DO  thiamine (VITAMIN B-1) 100 MG tablet Take  100 mg by mouth daily.    [provider]  traMADol (ULTRAM) 50 MG tablet Take 1 tablet (50 mg total) by mouth every 6 (six) hours as needed for severe pain. 09/29/18   Georgette Shell, MD  triamterene-hydrochlorothiazide (DYAZIDE) 37.5-25 MG capsule Take 1 capsule by mouth daily. 10/04/17   [provider]    Allergies    Lisinopril and Celecoxib  Review of Systems   Review of Systems  Unable to perform ROS: Patient unresponsive (level 5 caveat)    Physical Exam Updated Vital Signs BP 106/74   Pulse 64   Temp (!) 95.8 F (35.4 C) (Temporal)   Resp 18   Ht 5\' 10"  (1.778 m)   Wt 96 kg   SpO2 100%   BMI 30.37 kg/m   Physical Exam Constitutional:      Comments: Unresponsive, LMA airway in place  HENT:     Head: Normocephalic and atraumatic.  Eyes:     Comments: Pinpoint pupils  Cardiovascular:     Rate and Rhythm: Normal rate and regular rhythm.  Pulmonary:     Effort: Pulmonary effort is normal. No respiratory distress.     Comments: Rales bilaterally Abdominal:     General: There is no distension.     Tenderness: There is no abdominal tenderness.  Skin:    General: Skin is warm and dry.  Neurological:     GCS: GCS eye subscore is 1. GCS verbal subscore is 1. GCS motor subscore is 1.     Comments: Gag reflex present     ED Results / Procedures / Treatments   Labs (all labs ordered are listed, but only abnormal results are displayed) Labs Reviewed  I-STAT CHEM 8, ED - Abnormal; Notable for the following components:      Result Value   Creatinine, Ser 1.50 (*)    Glucose, Bld 169 (*)    All other components within normal limits  I-STAT VENOUS BLOOD GAS, ED - Abnormal; Notable  for the following components:   pH, Ven 7.090 (*)    pCO2, Ven 85.9 (*)    pO2, Ven 48.0 (*)    Acid-base deficit 6.0 (*)    All other components within normal limits  I-STAT ARTERIAL BLOOD GAS, ED - Abnormal; Notable for the following components:   pH, Arterial  7.267 (*)    pCO2 arterial 51.2 (*)    Acid-base deficit 4.0 (*)    HCT 37.0 (*)    Hemoglobin 12.6 (*)    All other components within normal limits  RESP PANEL BY RT-PCR (FLU A&B, COVID) ARPGX2  SARS CORONAVIRUS 2 (TAT 6-24 HRS)  PROTIME-INR  COMPREHENSIVE METABOLIC PANEL  CBC  ETHANOL  LACTIC ACID, PLASMA  LACTIC ACID, PLASMA  AMMONIA  CBC  CREATININE, SERUM  SAMPLE TO BLOOD BANK  TROPONIN I (HIGH SENSITIVITY)    EKG EKG Interpretation  Date/Time:  08-May-2021 22:56:14 EDT Ventricular Rate:  90 PR Interval:  174 QRS Duration: 95 QT Interval:  393 QTC Calculation: 481 R Axis:   79 Text Interpretation: Sinus rhythm Probable left atrial enlargement Borderline prolonged QT interval NO STEMI Confirmed by Octaviano Glow [phone]) on May 08, 2021 11:04:14 PM   Radiology DG Pelvis Portable  Result Date: 2021-05-08 CLINICAL DATA:  Trauma.  Cardiac arrest.  Overdose. EXAM: PORTABLE PELVIS 1-2 VIEWS COMPARISON:  None. FINDINGS: Mild degenerative changes in the hips. No evidence of acute fracture or dislocation of the pelvis. SI joints and symphysis pubis are not displaced. Surgical clips in the low pelvis over the prostate region. Vascular calcifications. IMPRESSION: No acute bony abnormalities. Electronically Signed   By: Lucienne Capers M.D.   On: 2021-05-08 23:18   DG Chest Portable 1 View  Result Date: 05/08/2021 CLINICAL DATA:  Cardiac arrest. Overdose. Trauma. Post intubation. EXAM: PORTABLE CHEST 1 VIEW COMPARISON:  09/24/2018 FINDINGS: An endotracheal tube has been placed with tip measuring 7.2 cm above the carina. Heart size and pulmonary vascularity are normal. Lungs are clear. No pleural effusions. No pneumothorax. Mediastinal contours appear intact. IMPRESSION: Endotracheal tube tip measures 7.2 cm above the carina. No evidence of active pulmonary disease. Electronically Signed   By: Lucienne Capers M.D.   On: 08-May-2021 23:17    Procedures .Critical  Care Performed by: Wyvonnia Dusky, MD Authorized by: Wyvonnia Dusky, MD   Critical care provider statement:    Critical care time (minutes):  45   Critical care was necessary to treat or prevent imminent or life-threatening deterioration of the following conditions:  Respiratory failure   Critical care was time spent personally by me on the following activities:  Discussions with consultants, evaluation of patient's response to treatment, examination of patient, ordering and performing treatments and interventions, ordering and review of laboratory studies, ordering and review of radiographic studies, pulse oximetry, re-evaluation of patient's condition, obtaining history from patient or surrogate and review of old charts Procedure Name: Intubation Date/Time: 04/14/2021 12:05 AM Performed by: Wyvonnia Dusky, MD Pre-anesthesia Checklist: Patient identified, Patient being monitored, Emergency Drugs available, Timeout performed and Suction available Oxygen Delivery Method: Non-rebreather mask Preoxygenation: Pre-oxygenation with 100% oxygen Induction Type: Rapid sequence Ventilation: Mask ventilation without difficulty Laryngoscope Size: Glidescope and 3 Tube size: 8.0 mm Number of attempts: 1 Airway Equipment and Method: Video-laryngoscopy Placement Confirmation: ETT inserted through vocal cords under direct vision,  CO2 detector and Breath sounds checked- equal and bilateral Secured at: 21 cm Tube secured with: ETT holder  Medications Ordered in ED Medications  etomidate (AMIDATE) injection (25 mg Intravenous Given 04/03/2021 2252)  rocuronium (ZEMURON) injection (80 mg Intravenous Given 03/31/2021 2252)  0.9 %  sodium chloride infusion (1,000 mLs Intravenous New Bag/Given 03/29/2021 2252)  Ampicillin-Sulbactam (UNASYN) 3 g in sodium chloride 0.9 % 100 mL IVPB (has no administration in time range)  fentaNYL (SUBLIMAZE) injection 25 mcg (has no administration in time range)   fentaNYL 2582mcg in NS 249mL (67mcg/ml) infusion-PREMIX (has no administration in time range)  fentaNYL (SUBLIMAZE) bolus via infusion 25-100 mcg (has no administration in time range)  midazolam (VERSED) 50 mg/50 mL (1 mg/mL) premix infusion (has no administration in time range)  lactated ringers infusion (has no administration in time range)  docusate sodium (COLACE) capsule 100 mg (has no administration in time range)  polyethylene glycol (MIRALAX / GLYCOLAX) packet 17 g (has no administration in time range)  heparin injection 5,000 Units (has no administration in time range)  cefTRIAXone (ROCEPHIN) 2 g in sodium chloride 0.9 % 100 mL IVPB (has no administration in time range)  naloxone [facility]) injection 2 mg (2 mg Intravenous Given 04/07/2021 2312)  sodium chloride 0.9 % bolus 1,000 mL (0 mLs Intravenous Stopped 04/12/2021 2340)    ED Course  I have reviewed the triage vital signs and the nursing notes.  Pertinent labs & imaging results that were available during my care of the patient were reviewed by me and considered in my medical decision making (see chart for details).  75 year old here postcardiac arrest with ROSC in the field.  History and presentation most consistent with opioid overdose, possible fentanyl overdose.  The patient arrived a pinpoint pupils.  He was given Narcan immediately after arrival, with subsequent awakening and response.  LMA was removed.  We attempted to place the patient on BiPAP, as he was tachypneic, appeared to be in significant pain, and was not following commands.  He did not tolerate the BiPAP mask.  I was concerned he may vomit and aspirate.  I made the decision subsequently to intubate.  This was performed with RSI using rocuronium and etomidate.  An 8-0 ETT was placed on first attempt.    Initial venous gas prior to intubation showed acidosis with hypercapnia.  Subsequent ABG after intubation showed improvement of the acidosis.  EKG on arrival showed no  acute ischemic findings.  Post intubation x-ray shows ET tube high above the carina.  We will advance this.  ICU was consulted will admit the patient.  Initially the patient started on propofol infusion, which subsequently changed to Versed.  The patient's wife provide supplemental history and was updated regarding his status.  Clinical Course as of 04/14/21 0010  Tue Apr 13, 2021  2259 Pt intubated.  ICU team consulted.  Advised RT to set RR at 18 given his acidosis, we'll need to recheck ABG in 30 minutes. [MT]  Wed Apr 14, 2021  0001 Patient's wife now present, updated regarding condition.  ICU doctor Carren Rang present to assume care.   [MT]  0004 Given hx of etoh use, I agree with ICU attending plan to switch sedation from propofol to versed.  [place] ordered by ICU attending.  Repeat ABG shows improvement of acidosis. [MT]    Clinical Course User Index [MT] Piotr [name], Carola Rhine, MD    Final Clinical Impression(s) / ED Diagnoses Final diagnoses:  Cardiac arrest ([place])  Opioid overdose, undetermined intent, initial encounter [facility])    Rx / DC Orders ED Discharge Orders  None       Wyvonnia Dusky, MD 04/14/21 [phone]

## 2021-04-13 NOTE — ED Notes (Signed)
OG tube attempted x4 by this rn, dr Langston Masker notified.

## 2021-04-13 NOTE — H&P (Addendum)
NAME:  Joshua Henson, MRN:  [id number], DOB:  1946/02/26, LOS: 0 ADMISSION DATE:  May 03, 2021, CONSULTATION DATE: 05-03-21 REFERRING MD: Physician emergency room, CHIEF COMPLAINT: Altered mental status.  History of Present Illness:  Patient is 75 year old Caucasian male not able to give any history history was obtained by police EMS wife and the ED physician wife was out of town for 9 days found him slumped over with a credit card powder and alcohol on the table called EMS EMS had him for around 15 to 20 minutes 5 rounds of epi for PEA patient was never intubated when he got here to the ED was given Narcan he was responsive became very agitated unable to tolerate the BiPAP and he was intubated for airway protection he never coded here in the ED he coded in the field. EMS and police think that the patient had fentanyl or cocaine wife thinks that he has benzos or Ativan that he might have crushed   Objective   Blood pressure 106/74, pulse 64, temperature (!) 95.8 F (35.4 C), temperature source Temporal, resp. rate 18, height 5\' 10"  (1.778 m), weight 96 kg, SpO2 100 %.    Vent Mode: PRVC FiO2 (%):  [60 %] 60 % Set Rate:  [18 bmp] 18 bmp Vt Set:  [580 mL] 580 mL PEEP:  [5 cmH20] 5 cmH20 Plateau Pressure:  [15 cmH20] 15 cmH20  No intake or output data in the 24 hours ending 2021-05-03 2359 Filed Weights   May 03, 2021 2244  Weight: 96 kg    Examination: General: Intubated sedated responsive at times Neuro: Moving upper lower extremities without limitations intubated sedated HEENT:  atraumatic , no jaundice , dry mucous membranes  Cardiovascular: Tachycardia, ESM 2/6 in the aortic area  Lungs:  CTA bilateral , no wheezing or crackles  Abdomen:  Soft lax +BS , no tenderness . Musculoskeletal:  WNL , normal pulses  Skin:  No rash      Assessment & Plan:    -- Severity mental status due to drug overdose cocaine or fentanyl or benzos at this point urine tox is pending patient  responded to Narcan in the violent we will let him stay under the medication and treat agitation.   --Coded for 20 minutes we will do a brain CT which is responsive not a candidate for hypothermia protocol  --Hypotension most likely due to all of the above and aspiration pneumonia we will start antibiotics pressors  --AKI aggressive IV fluid resuscitation  --Critical care time spent the care of this patient is 53 minutes    Labs   CBC: Recent Labs  Lab 2021-05-03 2246 05-03-21 2335  HGB 15.3  15.0 12.6*  HCT 45.0  44.0 37.0*    Basic Metabolic Panel: Recent Labs  Lab 2021-05-03 2246 05-03-21 2335  NA 142  142 143  K 4.4  4.4 3.7  CL 107  --   GLUCOSE 169*  --   BUN 21  --   CREATININE 1.50*  --    GFR: Estimated Creatinine Clearance: 51 mL/min (A) (by C-G formula based on SCr of 1.5 mg/dL (H)). No results for input(s): PROCALCITON, WBC, LATICACIDVEN in the last 168 hours.  Liver Function Tests: No results for input(s): AST, ALT, ALKPHOS, BILITOT, PROT, ALBUMIN in the last 168 hours. No results for input(s): LIPASE, AMYLASE in the last 168 hours. No results for input(s): AMMONIA in the last 168 hours.  ABG    Component Value Date/Time   PHART  7.267 (L) 04-22-2021 2335   PCO2ART 51.2 (H) Apr 22, 2021 2335   PO2ART 103 2021/04/22 2335   HCO3 23.8 Apr 22, 2021 2335   TCO2 25 Apr 22, 2021 2335   ACIDBASEDEF 4.0 (H) 04-22-21 2335   O2SAT 97.0 2021/04/22 2335     Coagulation Profile: No results for input(s): INR, PROTIME in the last 168 hours.  Cardiac Enzymes: No results for input(s): CKTOTAL, CKMB, CKMBINDEX, TROPONINI in the last 168 hours.  HbA1C: Hgb A1c MFr Bld  Date/Time Value Ref Range Status  07/01/2018 08:30 AM 5.6 4.8 - 5.6 % Final    Comment:    (NOTE) Pre diabetes:          5.7%-6.4% Diabetes:              >6.4% Glycemic control for   <7.0% adults with diabetes   09/03/2014 02:19 AM 5.7 (H) <5.7 % Final    Comment:    (NOTE)                                                                        According to the ADA Clinical Practice Recommendations for 2011, when HbA1c is used as a screening test:  >=6.5%   Diagnostic of Diabetes Mellitus           (if abnormal result is confirmed) 5.7-6.4%   Increased risk of developing Diabetes Mellitus References:Diagnosis and Classification of Diabetes Mellitus,Diabetes WRUE,4540,98(JXBJY 1):S62-S69 and Standards of Medical Care in         Diabetes - 2011,Diabetes Care,2011,34 (Suppl 1):S11-S61.    CBG: No results for input(s): GLUCAP in the last 168 hours.  Review of Systems:   Unable to obtain due to patient condition  Past Medical History:  He,  has a past medical history of Acute hepatic encephalopathy, Alcohol abuse, Alcoholic cirrhosis of liver without ascites ([place]), Anxiety state, CAD (coronary artery disease), native coronary artery (01/10/2016), Depression, Diastolic dysfunction, Fatty liver, alcoholic (7/82/9562), Hypertension, Increased ammonia level, Insomnia, NSTEMI (non-ST elevated myocardial infarction) [facility]), Prostate cancer ([place]) (2010), Sinus tachycardia, and Stroke ([place]).   Surgical History:   Past Surgical History:  Procedure Laterality Date  . CARDIAC CATHETERIZATION N/A 01/15/2016   Procedure: Left Heart Cath and Coronary Angiography;  Surgeon: Burnell Blanks, MD;  Location: [facility];  Service: Cardiovascular;  Laterality: N/A;     Social History:   reports that he has been smoking cigarettes. He has a 15.00 pack-year smoking history. He has never used smokeless tobacco. He reports current alcohol use of about 56.0 standard drinks of alcohol per week. He reports current drug use. Drug: Marijuana.   Family History:  His family history includes Diabetes in his mother; Esophageal cancer (age of onset: 82) in his mother; Heart attack in his maternal grandfather; Heart disease in his maternal grandfather; Hypertension in his mother; Prostate cancer  in his paternal grandfather; Prostate cancer (age of onset: 37) in his father.   Allergies Allergies  Allergen Reactions  . Lisinopril     syncope  . Celecoxib Rash     Home Medications  Prior to Admission medications   Medication Sig Start Date End Date Taking? Authorizing Provider  aspirin 81 MG EC tablet Take 1 tablet (81 mg total) by mouth  daily. 07/11/14   Angiulli, Lavon Paganini, PA-C  atorvastatin (LIPITOR) 40 MG tablet Take 1 tablet (40 mg total) by mouth daily at 6 PM. 01/19/16   Allie Bossier, MD  buPROPion [facility] SR) 200 MG 12 hr tablet Take 200 mg by mouth daily.    [provider]  carvedilol (COREG) 12.5 MG tablet Take 1 tablet (12.5 mg total) by mouth 2 (two) times daily. 02/23/17   Florencia Reasons, MD  chlordiazePOXIDE (LIBRIUM) 5 MG capsule Take 1 capsule (5 mg total) by mouth 3 (three) times daily as needed for anxiety. 09/29/18   Georgette Shell, MD  cholecalciferol (VITAMIN D) 1000 units tablet Take 1,000 Units by mouth 2 (two) times daily.     [provider]  Coenzyme Q10 (CO Q-10) 200 MG CAPS Take 200 mg by mouth daily.    [provider]  docusate sodium (COLACE) 100 MG capsule Take 1 capsule (100 mg total) by mouth 2 (two) times daily. 09/29/18   Georgette Shell, MD  folic acid (FOLVITE) 1 MG tablet take 1 tablet by mouth once daily 01/12/17   Medina-Vargas, Monina C, NP  LORazepam (ATIVAN) 1 MG tablet Take 1 tablet (1 mg total) by mouth 2 (two) times daily as needed for anxiety. 08/04/18   Aline August, MD  mirtazapine (REMERON) 15 MG tablet Take 15 mg by mouth at bedtime.    [provider]  Multiple Vitamin (MULTIVITAMIN WITH MINERALS) TABS tablet Take 1 tablet by mouth daily. 08/04/18   Aline August, MD  nitroGLYCERIN (NITROSTAT) 0.4 MG SL tablet Place 1 tablet (0.4 mg total) under the tongue every 5 (five) minutes as needed for chest pain. 05/13/16   Velvet Bathe, MD  Omega-3 Fatty Acids (FISH OIL) 1000 MG CAPS Take 1,000  mg by mouth 2 (two) times daily.     [provider]  ondansetron (ZOFRAN) 4 MG tablet Take 1 tablet (4 mg total) by mouth every 6 (six) hours as needed for nausea. 09/29/18   Georgette Shell, MD  polyethylene glycol [facility] Floria Raveling) packet Take 17 g by mouth daily. 07/04/18   Kayleen Memos, DO  QUEtiapine (SEROQUEL) 25 MG tablet Take 1 tablet (25 mg total) by mouth daily before breakfast. 11/22/17   Charlynne Cousins, MD  tamsulosin (FLOMAX) 0.4 MG CAPS capsule Take 1 capsule (0.4 mg total) by mouth daily. 07/03/18   Kayleen Memos, DO  thiamine (VITAMIN B-1) 100 MG tablet Take 100 mg by mouth daily.    [provider]  traMADol (ULTRAM) 50 MG tablet Take 1 tablet (50 mg total) by mouth every 6 (six) hours as needed for severe pain. 09/29/18   Georgette Shell, MD  triamterene-hydrochlorothiazide (DYAZIDE) 37.5-25 MG capsule Take 1 capsule by mouth daily. 10/04/17   [provider]     Critical care time: 53 minutes

## 2021-04-13 NOTE — ED Notes (Signed)
Respiratory at bedside readjusting ETT BACK 4CM

## 2021-04-13 NOTE — Code Documentation (Signed)
Positive color change, bilateral breath sounds, 7.5 ett.

## 2021-04-13 NOTE — ED Notes (Signed)
Pt placed on bipap at this time.

## 2021-04-14 ENCOUNTER — Encounter (HOSPITAL_COMMUNITY): Payer: Self-pay | Admitting: Internal Medicine

## 2021-04-14 ENCOUNTER — Inpatient Hospital Stay (HOSPITAL_COMMUNITY): Payer: PPO

## 2021-04-14 DIAGNOSIS — T402X1A Poisoning by other opioids, accidental (unintentional), initial encounter: Secondary | ICD-10-CM | POA: Insufficient documentation

## 2021-04-14 LAB — COMPREHENSIVE METABOLIC PANEL
ALT: 36 U/L (ref 0–44)
ALT: 34 U/L (ref 0–44)
AST: 36 U/L (ref 15–41)
AST: 38 U/L (ref 15–41)
Albumin: 3.5 g/dL (ref 3.5–5.0)
Albumin: 3.2 g/dL — ABNORMAL LOW (ref 3.5–5.0)
Alkaline Phosphatase: 81 U/L (ref 38–126)
Alkaline Phosphatase: 64 U/L (ref 38–126)
Anion gap: 8 (ref 5–15)
Anion gap: 9 (ref 5–15)
BUN: 17 mg/dL (ref 8–23)
BUN: 17 mg/dL (ref 8–23)
CO2: 26 mmol/L (ref 22–32)
CO2: 22 mmol/L (ref 22–32)
Calcium: 8.8 mg/dL — ABNORMAL LOW (ref 8.9–10.3)
Calcium: 8.2 mg/dL — ABNORMAL LOW (ref 8.9–10.3)
Chloride: 107 mmol/L (ref 98–111)
Chloride: 108 mmol/L (ref 98–111)
Creatinine, Ser: 1.66 mg/dL — ABNORMAL HIGH (ref 0.61–1.24)
Creatinine, Ser: 1.32 mg/dL — ABNORMAL HIGH (ref 0.61–1.24)
GFR, Estimated: 43 mL/min — ABNORMAL LOW (ref >60)
GFR, Estimated: 57 mL/min — ABNORMAL LOW (ref >60)
Glucose, Bld: 174 mg/dL — ABNORMAL HIGH (ref 70–99)
Glucose, Bld: 112 mg/dL — ABNORMAL HIGH (ref 70–99)
Potassium: 4.4 mmol/L (ref 3.5–5.1)
Potassium: 4.5 mmol/L (ref 3.5–5.1)
Sodium: 141 mmol/L (ref 135–145)
Sodium: 139 mmol/L (ref 135–145)
Total Bilirubin: 0.3 mg/dL (ref 0.3–1.2)
Total Bilirubin: 1.1 mg/dL (ref 0.3–1.2)
Total Protein: 6.5 g/dL (ref 6.5–8.1)
Total Protein: 5.8 g/dL — ABNORMAL LOW (ref 6.5–8.1)

## 2021-04-14 LAB — ETHANOL: Alcohol, Ethyl (B): <10 mg/dL (ref <10)

## 2021-04-14 LAB — CBC
HCT: 34.6 % — ABNORMAL LOW (ref 39.0–52.0)
HCT: 39.8 % (ref 39.0–52.0)
Hemoglobin: 12.3 g/dL — ABNORMAL LOW (ref 13.0–17.0)
Hemoglobin: 13.8 g/dL (ref 13.0–17.0)
MCH: 36.8 pg — ABNORMAL HIGH (ref 26.0–34.0)
MCH: 34.8 pg — ABNORMAL HIGH (ref 26.0–34.0)
MCHC: 35.5 g/dL (ref 30.0–36.0)
MCHC: 34.7 g/dL (ref 30.0–36.0)
MCV: 103.6 fL — ABNORMAL HIGH (ref 80.0–100.0)
MCV: 100.3 fL — ABNORMAL HIGH (ref 80.0–100.0)
Platelets: 139 10*3/uL — ABNORMAL LOW (ref 150–400)
Platelets: 199 10*3/uL (ref 150–400)
RBC: 3.34 MIL/uL — ABNORMAL LOW (ref 4.22–5.81)
RBC: 3.97 MIL/uL — ABNORMAL LOW (ref 4.22–5.81)
RDW: 14.5 % (ref 11.5–15.5)
RDW: 14.6 % (ref 11.5–15.5)
WBC: 15.7 10*3/uL — ABNORMAL HIGH (ref 4.0–10.5)
WBC: 22.1 10*3/uL — ABNORMAL HIGH (ref 4.0–10.5)
nRBC: 0 % (ref 0.0–0.2)
nRBC: 0 % (ref 0.0–0.2)

## 2021-04-14 LAB — CREATININE, SERUM
Creatinine, Ser: 1.33 mg/dL — ABNORMAL HIGH (ref 0.61–1.24)
GFR, Estimated: 56 mL/min — ABNORMAL LOW (ref >60)

## 2021-04-14 LAB — RAPID URINE DRUG SCREEN, HOSP PERFORMED
Amphetamines: NOT DETECTED
Barbiturates: NOT DETECTED
Benzodiazepines: POSITIVE — AB
Cocaine: POSITIVE — AB
Opiates: NOT DETECTED
Tetrahydrocannabinol: POSITIVE — AB

## 2021-04-14 LAB — LACTIC ACID, PLASMA
Lactic Acid, Venous: 2.2 mmol/L (ref 0.5–1.9)
Lactic Acid, Venous: 1.3 mmol/L (ref 0.5–1.9)

## 2021-04-14 LAB — GLUCOSE, CAPILLARY
Glucose-Capillary: 91 mg/dL (ref 70–99)
Glucose-Capillary: 93 mg/dL (ref 70–99)
Glucose-Capillary: 94 mg/dL (ref 70–99)

## 2021-04-14 LAB — MAGNESIUM: Magnesium: 2 mg/dL (ref 1.7–2.4)

## 2021-04-14 LAB — SAMPLE TO BLOOD BANK

## 2021-04-14 LAB — PHOSPHORUS: Phosphorus: 3.5 mg/dL (ref 2.5–4.6)

## 2021-04-14 LAB — AMMONIA: Ammonia: 23 umol/L (ref 9–35)

## 2021-04-14 LAB — TROPONIN I (HIGH SENSITIVITY): Troponin I (High Sensitivity): 23 ng/L — ABNORMAL HIGH (ref <18)

## 2021-04-14 LAB — PROTIME-INR
INR: 1 (ref 0.8–1.2)
Prothrombin Time: 13.6 seconds (ref 11.4–15.2)

## 2021-04-14 LAB — MRSA PCR SCREENING: MRSA by PCR: NEGATIVE

## 2021-04-14 MED ORDER — ADULT MULTIVITAMIN W/MINERALS CH
1.0000 | ORAL_TABLET | Freq: Every day | ORAL | Status: DC
  Administered 2021-04-15 – 2021-04-19 (×5): 1 via ORAL
  Filled 2021-04-14 (×5): qty 1

## 2021-04-14 MED ORDER — CHLORHEXIDINE GLUCONATE CLOTH 2 % EX PADS
6.0000 | MEDICATED_PAD | Freq: Every day | CUTANEOUS | Status: DC
  Administered 2021-04-16 (×2): 6 via TOPICAL

## 2021-04-14 MED ORDER — LORAZEPAM 2 MG/ML IJ SOLN
1.0000 mg | INTRAMUSCULAR | Status: DC | PRN
  Administered 2021-04-15 (×2): 2 mg via INTRAVENOUS
  Administered 2021-04-16: 4 mg via INTRAVENOUS
  Administered 2021-04-16: 2 mg via INTRAVENOUS
  Filled 2021-04-14 (×3): qty 1
  Filled 2021-04-14: qty 2
  Filled 2021-04-14: qty 1

## 2021-04-14 MED ORDER — FENTANYL CITRATE (PF) 100 MCG/2ML IJ SOLN: 25.0000 ug | INTRAMUSCULAR | Status: DC | PRN

## 2021-04-14 MED ORDER — SODIUM CHLORIDE 0.9 % IV SOLN
INTRAVENOUS | Status: DC | PRN
  Administered 2021-04-14: 250 mL via INTRAVENOUS

## 2021-04-14 MED ORDER — FENTANYL CITRATE (PF) 100 MCG/2ML IJ SOLN
25.0000 ug | INTRAMUSCULAR | Status: DC | PRN
Start: 2021-04-14 — End: 2021-04-14

## 2021-04-14 MED ORDER — ORAL CARE MOUTH RINSE
15.0000 mL | OROMUCOSAL | Status: DC
  Administered 2021-04-14 (×5): 15 mL via OROMUCOSAL

## 2021-04-14 MED ORDER — NOREPINEPHRINE 4 MG/250ML-% IV SOLN
0.0000 ug/min | INTRAVENOUS | Status: DC
  Administered 2021-04-14: 2 ug/min via INTRAVENOUS
  Filled 2021-04-14: qty 250

## 2021-04-14 MED ORDER — GABAPENTIN 100 MG PO CAPS
200.0000 mg | ORAL_CAPSULE | Freq: Two times a day (BID) | ORAL | Status: DC
  Administered 2021-04-14 – 2021-04-19 (×10): 200 mg via ORAL
  Filled 2021-04-14 (×11): qty 2

## 2021-04-14 MED ORDER — OXYCODONE HCL 5 MG PO TABS
5.0000 mg | ORAL_TABLET | Freq: Once | ORAL | Status: AC
  Administered 2021-04-14: 5 mg via ORAL
  Filled 2021-04-14: qty 1

## 2021-04-14 MED ORDER — FOLIC ACID 1 MG PO TABS
1.0000 mg | ORAL_TABLET | Freq: Every day | ORAL | Status: DC
  Administered 2021-04-14 – 2021-04-19 (×6): 1 mg via ORAL
  Filled 2021-04-14 (×6): qty 1

## 2021-04-14 MED ORDER — MELATONIN 3 MG PO TABS
3.0000 mg | ORAL_TABLET | Freq: Once | ORAL | Status: AC | PRN
  Administered 2021-04-14: 3 mg via ORAL
  Filled 2021-04-14: qty 1

## 2021-04-14 MED ORDER — THIAMINE HCL 100 MG/ML IJ SOLN
Freq: Once | INTRAVENOUS | Status: AC
  Filled 2021-04-14: qty 1000

## 2021-04-14 MED ORDER — OXYCODONE HCL 5 MG PO TABS
5.0000 mg | ORAL_TABLET | Freq: Once | ORAL | Status: AC | PRN
  Administered 2021-04-14: 5 mg via ORAL
  Filled 2021-04-14: qty 1

## 2021-04-14 MED ORDER — LORAZEPAM 2 MG/ML IJ SOLN: 0.0000 mg | Freq: Three times a day (TID) | INTRAMUSCULAR | Status: DC

## 2021-04-14 MED ORDER — SODIUM CHLORIDE 0.9% FLUSH
10.0000 mL | Freq: Two times a day (BID) | INTRAVENOUS | Status: DC
  Administered 2021-04-14: 10 mL

## 2021-04-14 MED ORDER — ACETAMINOPHEN 325 MG PO TABS
650.0000 mg | ORAL_TABLET | Freq: Four times a day (QID) | ORAL | Status: DC | PRN
  Administered 2021-04-14 – 2021-04-18 (×11): 650 mg via ORAL
  Filled 2021-04-14 (×13): qty 2

## 2021-04-14 MED ORDER — NOREPINEPHRINE 16 MG/250ML-% IV SOLN
0.0000 ug/min | INTRAVENOUS | Status: DC
  Administered 2021-04-14: 8 ug/min via INTRAVENOUS
  Filled 2021-04-14: qty 250

## 2021-04-14 MED ORDER — BUSPIRONE HCL 5 MG PO TABS
10.0000 mg | ORAL_TABLET | Freq: Two times a day (BID) | ORAL | Status: DC
  Administered 2021-04-14 – 2021-04-19 (×10): 10 mg via ORAL
  Filled 2021-04-14: qty 2
  Filled 2021-04-14: qty 1
  Filled 2021-04-14 (×2): qty 2
  Filled 2021-04-14 (×2): qty 1
  Filled 2021-04-14: qty 2
  Filled 2021-04-14: qty 1
  Filled 2021-04-14 (×2): qty 2

## 2021-04-14 MED ORDER — SODIUM CHLORIDE 0.9% FLUSH: 10.0000 mL | INTRAVENOUS | Status: DC | PRN

## 2021-04-14 MED ORDER — LORAZEPAM 1 MG PO TABS
1.0000 mg | ORAL_TABLET | ORAL | Status: DC | PRN
  Administered 2021-04-16: 1 mg via ORAL
  Filled 2021-04-14: qty 1

## 2021-04-14 MED ORDER — THIAMINE HCL 100 MG PO TABS
100.0000 mg | ORAL_TABLET | Freq: Every day | ORAL | Status: DC
  Administered 2021-04-14 – 2021-04-19 (×6): 100 mg via ORAL
  Filled 2021-04-14 (×6): qty 1

## 2021-04-14 MED ORDER — LORAZEPAM 2 MG/ML IJ SOLN
0.0000 mg | INTRAMUSCULAR | Status: DC
  Administered 2021-04-16 (×2): 1 mg via INTRAVENOUS
  Filled 2021-04-14 (×2): qty 1

## 2021-04-14 MED ORDER — PANTOPRAZOLE SODIUM 40 MG IV SOLR
40.0000 mg | Freq: Every day | INTRAVENOUS | Status: DC
  Administered 2021-04-14: 40 mg via INTRAVENOUS
  Filled 2021-04-14: qty 40

## 2021-04-14 MED ORDER — CHLORHEXIDINE GLUCONATE 0.12% ORAL RINSE (MEDLINE KIT)
15.0000 mL | Freq: Two times a day (BID) | OROMUCOSAL | Status: DC
  Administered 2021-04-14 – 2021-04-15 (×3): 15 mL via OROMUCOSAL

## 2021-04-14 NOTE — ED Notes (Signed)
[name] wife at bedside [phone]

## 2021-04-14 NOTE — Progress Notes (Signed)
Patient transported to CT and then to 8L37 without complications.

## 2021-04-14 NOTE — Plan of Care (Signed)

## 2021-04-14 NOTE — Procedures (Signed)
Extubation Procedure Note  Patient Details:   Name: Joshua Henson DOB: December 13, 1946 MRN: [id number]   Airway Documentation:    Vent end date: 04/14/21 Vent end time: 1500   Evaluation  O2 sats: stable throughout Complications: No apparent complications Patient did tolerate procedure well. Bilateral Breath Sounds: Diminished   Yes   Pt was extubated per MD order and placed on 2 L [place]. Cuff leak was noted prior to extubation and no stridor post. Pt is stable at this time. Rt will monitor.   Ronaldo Miyamoto 04/14/2021, 3:11 PM

## 2021-04-14 NOTE — Progress Notes (Addendum)
NAME:  Joshua Henson, MRN:  [id number], DOB:  Oct 10, 1946, LOS: 1 ADMISSION DATE:  2021-04-14, CONSULTATION DATE: 2021/04/14 REFERRING MD: Physician emergency room, CHIEF COMPLAINT: Altered mental status.  History of Present Illness:  Joshua Henson is a 75 y.o. with a PMX of chronic alcohol use with hx of withdraw (on ativan taper), prior CVA, N-STEMI, and HTN, who presented to [facility] on 4/26 post PEA 20 minute arrest with ROSC and suspected overdose of a unknown substance. He arrived initially with a king airway in place, was given narcan, responded, was switched to bipap but was later intubated for airway protection.  He is critically ill in the [facility] ICU.   Pertinent  Medical History  Chronic alcohol use with hx of withdraw (on ativan taper), prior CVA, N-STEMI, HTN, Anxiety, Depression, Diastolic Dysfunction, Alcoholic cirrhosis without acites  Significant Hospital Events: Including procedures, antibiotic start and stop dates in addition to other pertinent events   . 4/26 PEA 20 minute arrest with ROSC. Suspect OD. Admit. Response to Narcan. Head CT> Nop acute process. UDS> +Cocaine, +THC +Benzos, -Opiates. ETT> CVC>. 1 dose unasyn, Rocephin>  Interim History / Subjective:  Admit, see UDS and drug screen above  Head CT Negative  8 of levo  Fentanyl 25/ Versed 1 upon exam, stopped, failed two SBT going into backup mode  490 UOP, +375 admit  Intubated  Unable to obtain subjective evaluation due to patient status  Objective   Blood pressure 112/63, pulse 60, temperature 97.7 F (36.5 C), temperature source Oral, resp. rate 18, height 5\' 10"  (1.778 m), weight 105.9 kg, SpO2 100 %. CVP:  [13 mmHg] 13 mmHg  Vent Mode: PSV;CPAP FiO2 (%):  [40 %-60 %] 40 % Set Rate:  [18 bmp] 18 bmp Vt Set:  [580 mL] 580 mL PEEP:  [5 cmH20] 5 cmH20 Pressure Support:  [10 cmH20] 10 cmH20 Plateau Pressure:  [15 cmH20-16 cmH20] 16 cmH20   Intake/Output Summary (Last 24 hours) at 04/14/2021 1607 Last  data filed at 04/14/2021 0700 Gross per 24 hour  Intake 865.65 ml  Output 490 ml  Net 375.65 ml   Filed Weights   Apr 14, 2021 2244 04/14/21 0200  Weight: 96 kg 105.9 kg    Examination: General: Appears comfotable, NAD, in bed  HEENT: MM pink/moist, anicteric, trachea midline, ETT, CVC c/d/i  Neuro: GCS 11t, RASS 0 to +1, PERRL 39mm CV: S1S2, NSR, no m/r/g appreciated PULM:  Clear in the upper lobes, slight weezes in the lower lobes, scant secretions, chest expansion symmetric GI: soft, bsx4 hypoactive, non tender   Renal: Foley in place, yellow urine, no sediment Extremities: warm/dry, no edema appreciated, capillary refill less than 3 seconds  Skin: no rashes or lesions appreciated   Labs/imaging that I havepersonally reviewed  (right click and "Reselect all SmartList Selections" daily)  ABG, CT, CXR, CMP, Trop, ETOH, Lactate  Resolved Hospital Problem list     Assessment & Plan:  Acute Respiratory Failure with Hypercarbia requiring Mechanical Ventilation S/p substance OD. ABG acute resp acidosis. CXR stable lines and airway, image similar to previous film. -LTVV strategy with tidal volumes of 4-8 cc/kg ideal body weight -Wean PEEP/FiO2 for SpO2 greater than 92 -Follow intermittent CXR and ABG PRN -VAP Protocol with pulmonary hygiene -SAT/SBT today, hope to extubate based on exam -Continue PAD bundle stopping Versed GTT and Fentanyl GTT for weaning. Starting PRN fentanyl IV push and CIWA with ativan. Goal rass 0 to -1. Titrate medictions to goal RASS. -Continue  Rocephin for C/O asp pnu vs CAP  Acute Metabolic Encephalopathy S/p substance OD, hypercarbia. Head CT negative for acute change. UDS+ for cocaine, Benzo, THC. Opiates negative. EMS concern for fentanyl. Responded to Narcan in ED.  -PAD bundle as discussed -Daily SAT -Delirium prevention measures  PEA Arrest prior to arrival Hypotension Requiring Vasopressors S/p substance OD, lactate 2.2>1.3. Troponin elevation  post CPR trauma. -On levophed, goal map greater than 65, titrate to goal -Cardiac hx. +375 admit. Will consider small IVF bolus if unable to wean levophed. -restarting home statin  -Continue tele  Acute Kidney Injury Suspect low flow, prerenal, Creat 1.6>1.5>1.3 -Continue IVF -Ensure renal perfusion. Goal MAP 65 or greater. -Avoid neprotoxic drugs as possible. -Strict I&O's -CMP now. Follow up. Follow up AM creatinine  HX ETOH Abuse with H/O with Withdraw HX depression Overdose of unknown substance Was on Ativan taper before admit. UDS+ for cocaine, Benzo, THC. Opiates negative. EMS concern for fentanyl. Responded to Narcan in ED. -On CIWA -ETOH cessation education when appropriate -Will inquire about SI attempt post extubation. -Starting PO thiamine, folate, multivitamin.  -Restarting home gabapentin and busbar  Best practice (right click and "Reselect all SmartList Selections" daily)  Diet:  NPO Pain/Anxiety/Delirium protocol (if indicated): Yes (RASS goal 0) VAP protocol (if indicated): Yes DVT prophylaxis: Subcutaneous Heparin GI prophylaxis: PPI Glucose control:  SSI No Central venous access:  Yes, and it is still needed Arterial line:  N/A Foley:  Yes, and it is still needed Mobility:  bed rest  PT consulted: N/A Last date of multidisciplinary goals of care discussion [Wife updated overnight 4/26] Code Status:  full code Disposition: ICU  Critical care time: Cunningham George, Jr., MSN, APRN, AGACNP-BC [facility]  04/14/2021 , 8:06 AM  Please see [email or web address] for pager details  If no response, please call [phone] After hours, please call Elink at [phone]   Pulmonary critical care attending:  This is a 75 year old gentleman history of chronic alcohol abuse, CVA, NSTEMI, hypertension.  Had a PEA cardiac arrest on Apr 24, 2021.  Suspected drug overdose with evidence of white powder in the room.  Patient responded  to Narcan.  Patient was intubated for airway protection.  Patient was admitted to the intensive care unit for further management.  Patient is awake alert following commands more no mechanical ventilator he was still sleepy at times.  All sedation held.  We will trial for SBT SAT.  BP 111/70   Pulse 77   Temp 98.2 F (36.8 C) (Oral)   Resp (!) 23   Ht 5\' 10"  (1.778 m)   Wt 105.9 kg   SpO2 96%   BMI 33.50 kg/m   Elderly male, chronically ill-appearing intubated on mechanical life support HEENT: Endotracheal tube in place Heart: Regular rate rhythm S1-S2 Lungs: Clear to auscultation bilaterally, no wheeze Abdomen: Soft nontender nondistended  Labs: Reviewed  Assessment: Acute hypoxemic hypercarbic respiratory failure secondary to overdose, likely opiate overdose, question possible fentanyl. History of polysubstance abuse, history of alcohol abuse. Acute metabolic encephalopathy secondary to above PEA cardiac arrest secondary to above AKI secondary to above, improving Elevation likely secondary to CPR  Plan: SBT SAT, plan for liberation from the vent today. Restart home medications. Observe in the ICU post extubation Can likely transfer from the intensive care unit pickup by [facility] tomorrow a.m. CIWA protocol Continue thiamine folate multivitamin Restart home gabapentin and BuSpar.  This patient is critically ill with multiple organ system failure;  which, requires frequent high complexity decision making, assessment, support, evaluation, and titration of therapies. This was completed through the application of advanced monitoring technologies and extensive interpretation of multiple databases. During this encounter critical care time was devoted to patient care services described in this note for 34 minutes.  [facility] 04/14/2021 4:19 PM

## 2021-04-14 NOTE — Progress Notes (Addendum)
[facility] Progress Note Patient Name: Joshua Henson DOB: 01/18/1946 MRN: [id number]   Date of Service  04/14/2021  HPI/Events of Note  Dr Milon Dikes H and P reviewed. Data reviewed.  Camera: HR 67, MAP 91, sats 00% On AC 580( around 8 ml/ibw)/05/06/59%. P peak 24. In synchrony. Ve 10 Lit Obese. Levo15, midaz/fenta gtt. Hypothermic at 95.  A/P: 1. S/p Cardiac arrest, coded for 20 mins. Low GCS-from OD of either of these; cocaine/fenta/benzos  2. AKI.   eICU Interventions  - improving resp acidosis. Wean VT as tolerated for lung protective ventilation. - CxR post line : no pneumo. Images seen. - follow LA, troponin, toxicology - covid/flu neg. EF normal from 2019.  Discussed with bed side RN.   CT head done, pending reports - Tox/LA and trop.  Wean VT as tolerated.  - not on HT, already hypothermic. Get TSH also.  - on VTE-sq heparin. BG goals < 180     Intervention Category Major Interventions: Acid-Base disturbance - evaluation and management;Respiratory failure - evaluation and management;Other:;Hypercarbia - evaluation and management Evaluation Type: New Patient Evaluation  Elmer Sow 04/14/2021, 2:02 AM   4:53 AM CTH neg Tox: + for cocaine/THC/benzos.

## 2021-04-14 NOTE — ED Notes (Signed)
Dr Milon Dikes at bedside for central line placement.

## 2021-04-14 NOTE — Progress Notes (Signed)
Pt woke up, able to f/c and nod/shake head appropriately. Explained to pt where he was and what happened. Asked if he had taken any drugs, he nodded yes. Asked about various drugs, nodded head when asked if it was cocaine, shook head no to other drug names (ex. Fentanyl, heroin). UDS pending.   Pt currently calm, RASS -1 to -2 on versed and fentanyl gtts. On Levo gtt as well. Will continue to monitor.

## 2021-04-14 NOTE — Progress Notes (Signed)
[place] Progress Note Patient Name: Joshua Henson DOB: Oct 09, 1946 MRN: [id number]   Date of Service  04/14/2021  HPI/Events of Note  Asking for sleep aid for sleeping issues  eICU Interventions  Melatonin ordered     Intervention Category Minor Interventions: Other:  Elmer Sow 04/14/2021, 11:22 PM

## 2021-04-14 NOTE — Social Work (Addendum)
CSW received consult for substance use resources for patient. CSW will continue to follow and offer patient resources when appropriate.

## 2021-04-14 NOTE — Progress Notes (Signed)
[place] Progress Note Patient Name: Joshua Henson DOB: [birth year] MRN: [id number]   Date of Service  04/14/2021  HPI/Events of Note  Pt having pain in chest from CPR. Only has Tylenol. Can take PO   Camera: Extubated at noon. Obese. On 4 lit o2. VS stable.  As per RN, AAO x 3.  Oxy IR helped with pain. Now has mild pain only.   eICU Interventions  - Oxy IR 5 mg prn for moderate pain ordered once. Watch for lethargy.      Intervention Category Intermediate Interventions: Other:  Elmer Sow 04/14/2021, 8:42 PM

## 2021-04-14 NOTE — Procedures (Signed)
Name:  LIN GLAZIER MRN:  [id number] DOB:  [birth year]  PROCEDURE NOTE  Procedure:  Central venous catheter placement.  Indications:  Need for intravenous access and hemodynamic monitoring.  Consent:  Consent was implied due to the emergency nature of the procedure.  Anesthesia:  A total of 10 mL of 1% Lidocaine was used for local infiltration anesthesia.  Procedure summary:  Appropriate equipment was assembled.  The patient was identified as Joshua Henson and safety timeout was performed. The patient was placed in Trendelenburg position.  Sterile technique was used. The patient's right anterior chest wall was prepped using chlorhexidine / alcohol scrub and the field was draped in usual sterile fashion with full body drape. After the adequate anesthesia was achieved, the right subclavian vein was cannulated with the introducer needle no difficulty. A guide wire was advanced through the introducer needle, which was then withdrawn. A small skin incision was made at the point of wire entry, the dilator was inserted over the guide wire and appropriate dilation was obtained. The dilator was removed and 7 French triple-lumen catheter was advanced over the guide wire, which was then removed.  All ports were aspirated and flushed with normal saline without difficulty. The catheter was secured into place at 18 cm. Antibiotic patch was placed and sterile dressing was applied. Post-procedure chest x-ray was ordered.  Complications:  No immediate complications were noted.  Hemodynamic parameters and oxygenation remained stable throughout the procedure.  Estimated blood loss:  Less then 5 mL.  Etheleen Nicks, MD  04/14/2021, 1:20 AM

## 2021-04-15 DIAGNOSIS — I469 Cardiac arrest, cause unspecified: Secondary | ICD-10-CM

## 2021-04-15 LAB — COMPREHENSIVE METABOLIC PANEL
ALT: 26 U/L (ref 0–44)
AST: 30 U/L (ref 15–41)
Albumin: 2.8 g/dL — ABNORMAL LOW (ref 3.5–5.0)
Alkaline Phosphatase: 57 U/L (ref 38–126)
Anion gap: 7 (ref 5–15)
BUN: 14 mg/dL (ref 8–23)
CO2: 25 mmol/L (ref 22–32)
Calcium: 8.1 mg/dL — ABNORMAL LOW (ref 8.9–10.3)
Chloride: 104 mmol/L (ref 98–111)
Creatinine, Ser: 1.18 mg/dL (ref 0.61–1.24)
GFR, Estimated: >60 mL/min (ref >60)
Glucose, Bld: 108 mg/dL — ABNORMAL HIGH (ref 70–99)
Potassium: 3.6 mmol/L (ref 3.5–5.1)
Sodium: 136 mmol/L (ref 135–145)
Total Bilirubin: 0.7 mg/dL (ref 0.3–1.2)
Total Protein: 5.4 g/dL — ABNORMAL LOW (ref 6.5–8.1)

## 2021-04-15 LAB — CBC
HCT: 32.8 % — ABNORMAL LOW (ref 39.0–52.0)
Hemoglobin: 12.2 g/dL — ABNORMAL LOW (ref 13.0–17.0)
MCH: 36.7 pg — ABNORMAL HIGH (ref 26.0–34.0)
MCHC: 37.2 g/dL — ABNORMAL HIGH (ref 30.0–36.0)
MCV: 98.8 fL (ref 80.0–100.0)
Platelets: 170 10*3/uL (ref 150–400)
RBC: 3.32 MIL/uL — ABNORMAL LOW (ref 4.22–5.81)
RDW: 14.8 % (ref 11.5–15.5)
WBC: 10.8 10*3/uL — ABNORMAL HIGH (ref 4.0–10.5)
nRBC: 0 % (ref 0.0–0.2)

## 2021-04-15 LAB — GLUCOSE, CAPILLARY
Glucose-Capillary: 106 mg/dL — ABNORMAL HIGH (ref 70–99)
Glucose-Capillary: 122 mg/dL — ABNORMAL HIGH (ref 70–99)
Glucose-Capillary: 122 mg/dL — ABNORMAL HIGH (ref 70–99)
Glucose-Capillary: 127 mg/dL — ABNORMAL HIGH (ref 70–99)
Glucose-Capillary: 83 mg/dL (ref 70–99)

## 2021-04-15 LAB — TROPONIN I (HIGH SENSITIVITY)
Troponin I (High Sensitivity): 157 ng/L (ref <18)
Troponin I (High Sensitivity): 92 ng/L — ABNORMAL HIGH (ref <18)

## 2021-04-15 MED ORDER — ATORVASTATIN CALCIUM 40 MG PO TABS
40.0000 mg | ORAL_TABLET | Freq: Every day | ORAL | Status: DC
  Administered 2021-04-15 – 2021-04-16 (×2): 40 mg via ORAL
  Filled 2021-04-15 (×2): qty 1

## 2021-04-15 MED ORDER — PANTOPRAZOLE SODIUM 40 MG PO TBEC
40.0000 mg | DELAYED_RELEASE_TABLET | Freq: Every day | ORAL | Status: DC
  Administered 2021-04-15 – 2021-04-19 (×5): 40 mg via ORAL
  Filled 2021-04-15 (×5): qty 1

## 2021-04-15 MED ORDER — TRAMADOL HCL 50 MG PO TABS
50.0000 mg | ORAL_TABLET | Freq: Four times a day (QID) | ORAL | Status: DC | PRN
  Administered 2021-04-15 – 2021-04-18 (×7): 50 mg via ORAL
  Filled 2021-04-15 (×8): qty 1

## 2021-04-15 NOTE — Progress Notes (Signed)
Late entry  Troponin down from this am EKG benign to my overview with no concerning damage pattern concerning for ischemia  No further work-up unless crescendo --decrescendo classic ACs sympt--rise in trop likely 2/2 prior CPR and ROSC  Verneita Griffes, MD [facility] 8:55 PM

## 2021-04-15 NOTE — Hospital Course (Signed)
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

## 2021-04-15 NOTE — Progress Notes (Signed)
Patient urinated 261ml.

## 2021-04-15 NOTE — Social Work (Signed)
CSW received consult for substance use resources for patient. Patient spoke with patient at bedside. CSW offered patient outpatient substance use treatment services resources.Patient accepted.CSW will continue to follow.

## 2021-04-15 NOTE — Progress Notes (Signed)
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) EtOH with prior alcoholic pancreatitis 3785, polysubstance (cocaine), Tobacco abuse CAD (cath 2017 mild RCA disease), Left frontal infarct 2015 HFpEF, Prior ascending thoracic aortic aneurysm diagnosed 2018 HTN,  Depression  Admit 04/01/2021 PEA arrest ROSC-suspected overdose substance-Rx Narcan intubated for airway protection Head CT no acute process UDS showed positive cocaine THC benzo Given Unasyn Rocephin Required Levophed initially  Extubated 4/27  Hospital-Problem based course  Status post PEA arrest with 20 minutes CPR with ROSC Patient having chest pain from compressions--seems positional Start tramadol Mobilize with therapy services Elevated troponin-probably a consequence of CPR? Rpt Troponin  EKG rpt now to ensure no ischemia Polysubstance abuse UDS positive for cocaine and THC benzos Offer support with regards to cessation counseling--was on Ativan taper prior to admit for ethanol withdrawal Situational depression/adjustment disorder NOS Patient's wife has recurrent lymphoma Patient's in-laws have various cancers and children are estranged and lives in [employer] claims he fell off the bandwagon several months ago because of distress Counseled him that he probably would benefit from having a chaplain talk to him to discuss these things We will asked psychiatry to not emergently discussed with him options-these may include possible SSRI once we can clearly delineate if he is not having active ischemia Acute metabolic encephalopathy on admission Resolved currently AKI on admit Felt to be prerenal in origin Resolving well ?  Pneumonia on admission Received 2 days of Rocephin ending 4/27 and now have stopped    DVT prophylaxis: Heparin 3 times daily Code Status: Full code Family  Communication: No family present Disposition:  Status is: Inpatient  Remains inpatient appropriate because:Hemodynamically unstable and Ongoing active pain requiring inpatient pain management   Dispo: The patient is from: Home              Anticipated d/c is to: Home              Patient currently is not medically stable to d/c.   Difficult to place patient No       Consultants:   Assumed care from critical care  Procedures:   Antimicrobials: Rocephin until 4/27   Subjective: Subjective pain with deep breathing and movement Relates long history of alcoholism and explains that he is trying to deal with his children being away at law school and recent losses in his family Does not seem to have ischemic type symptoms to his chest pain i.e. it is clearly positional  Objective: Vitals:   04/15/21 1530 04/15/21 1545 04/15/21 1600 04/15/21 1615  BP:   (!) 136/104   Pulse: 85 76 77 79  Resp: _0 Temp:    98.3 F (36.8 C)  TempSrc:    Oral  SpO2: 93% 93% 94% 92%  Weight:      Height:        Intake/Output Summary (Last 24 hours) at 04/15/2021 1628 Last data filed at 04/15/2021 1200 Gross per 24 hour  Intake 1430.05 ml  Output 1300 ml  Net 130.05 ml   Filed Weights   03/19/2021 2244 04/14/21 0200 04/15/21 0637  Weight: 96 kg 105.9 kg 109.4 kg    Examination:  EOMI NCAT flat affect disheveled No JVD S1-S2 sinus rhythm no murmur Abdomen soft no rebound no guarding No lower extremity edema Neurologically intact without any focal deficits  Data Reviewed: personally reviewed  Data BUNs/creatinine 17/1.66-->14/1.1 Albumin 2.8 WBC 10.8 Platelet 170   Radiology Studies: CT HEAD WO CONTRAST  Result Date: 04/14/2021 CLINICAL DATA:  Initial evaluation for acute headache, intracranial hemorrhage suspected, mental status change. EXAM: CT HEAD WITHOUT CONTRAST TECHNIQUE: Contiguous axial images were obtained from the base of the skull through the vertex  without intravenous contrast. COMPARISON:  Prior head CT from 06/30/2018. FINDINGS: Brain: Generalized age-related cerebral atrophy with chronic microvascular ischemic disease, stable. No acute intracranial hemorrhage. No visible acute large vessel territory infarct. No mass lesion, midline shift or mass effect. No hydrocephalus or extra-axial fluid collection. Vascular: No hyperdense vessel. Calcified atherosclerosis present at the skull base. Skull: Scalp soft tissues demonstrate no acute finding. Calvarium intact. Sinuses/Orbits: Globes and orbital soft tissues demonstrate no acute finding. Few small calcific densities adjacent to the left globe noted, similar to previous. Scattered mucosal thickening in opacity noted throughout the ethmoidal air cells, sphenoid sinuses, and maxillary sinuses. Changes of chronic maxillary sinusitis noted as well. Endotracheal tube in place. No mastoid effusion. Other: None. IMPRESSION: 1. No acute intracranial abnormality. 2. Generalized age-related cerebral atrophy with chronic microvascular ischemic disease, stable. Electronically Signed   By: Jeannine Boga M.D.   On: 04/14/2021 03:15   DG Pelvis Portable  Result Date: 04/10/2021 CLINICAL DATA:  Trauma.  Cardiac arrest.  Overdose. EXAM: PORTABLE PELVIS 1-2 VIEWS COMPARISON:  None. FINDINGS: Mild degenerative changes in the hips. No evidence of acute fracture or dislocation of the pelvis. SI joints and symphysis pubis are not displaced. Surgical clips in the low pelvis over the prostate region. Vascular calcifications. IMPRESSION: No acute bony abnormalities. Electronically Signed   By: Lucienne Capers M.D.   On: 03/20/2021 23:18   DG Chest Portable 1 View  Result Date: 04/14/2021 CLINICAL DATA:  Central line placement EXAM: PORTABLE CHEST 1 VIEW COMPARISON:  03/29/2021 FINDINGS: Shallow inspiration. Heart size and pulmonary vascularity are normal. Lungs are clear. No pleural effusions. No pneumothorax.  Endotracheal tube is unchanged in position. A right central venous catheter has been placed with tip over the low SVC region. IMPRESSION: Interval placement of right central venous catheter. No pneumothorax. Electronically Signed   By: Lucienne Capers M.D.   On: 04/14/2021 01:26   DG Chest Portable 1 View  Result Date: 04/11/2021 CLINICAL DATA:  Cardiac arrest. Overdose. Trauma. Post intubation. EXAM: PORTABLE CHEST 1 VIEW COMPARISON:  09/24/2018 FINDINGS: An endotracheal tube has been placed with tip measuring 7.2 cm above the carina. Heart size and pulmonary vascularity are normal. Lungs are clear. No pleural effusions. No pneumothorax. Mediastinal contours appear intact. IMPRESSION: Endotracheal tube tip measures 7.2 cm above the carina. No evidence of active pulmonary disease. Electronically Signed   By: Lucienne Capers M.D.   On: 04/06/2021 23:17     Scheduled Meds: . atorvastatin  40 mg Oral Daily  . busPIRone  10 mg Oral BID  . chlorhexidine gluconate (MEDLINE KIT)  15 mL Mouth Rinse BID  . Chlorhexidine Gluconate Cloth  6 each Topical Daily  . folic acid  1 mg Oral Daily  . gabapentin  200 mg Oral BID  . heparin  5,000 Units Subcutaneous Q8H  . LORazepam  0-4 mg Intravenous Q4H   Followed by  . [START ON 04/16/2021] LORazepam  0-4 mg Intravenous Q8H  . multivitamin with minerals  1 tablet Oral Daily  . pantoprazole  40 mg Oral Daily  . thiamine  100 mg Oral Daily   Continuous Infusions: . sodium chloride Stopped (04/14/21  2242)  . cefTRIAXone (ROCEPHIN)  IV Stopped (04/14/21 2143)     LOS: 2 days   Time spent: 39  [name], MD [facility] To contact the attending provider between 7A-7P or the covering provider during after hours 7P-7A, please log into the web site [email or web address] and access using universal [place] password for that web site. If you do not have the password, please call the hospital operator.  04/15/2021, 4:28 PM

## 2021-04-15 NOTE — Progress Notes (Addendum)
Bladder Scan resulted 314ml . Pt is not complaining of any pain or discomfort at this time. Will continue to monitor for any urine output. Plan to re-scan bladder in 2hrs.

## 2021-04-16 DIAGNOSIS — T402X1A Poisoning by other opioids, accidental (unintentional), initial encounter: Principal | ICD-10-CM

## 2021-04-16 DIAGNOSIS — I469 Cardiac arrest, cause unspecified: Secondary | ICD-10-CM | POA: Diagnosis not present

## 2021-04-16 DIAGNOSIS — F10230 Alcohol dependence with withdrawal, uncomplicated: Secondary | ICD-10-CM

## 2021-04-16 LAB — TROPONIN I (HIGH SENSITIVITY): Troponin I (High Sensitivity): 67 ng/L — ABNORMAL HIGH (ref <18)

## 2021-04-16 LAB — GLUCOSE, CAPILLARY: Glucose-Capillary: 135 mg/dL — ABNORMAL HIGH (ref 70–99)

## 2021-04-16 MED ORDER — MAGNESIUM SULFATE 2 GM/50ML IV SOLN
2.0000 g | Freq: Once | INTRAVENOUS | Status: AC
  Administered 2021-04-16: 2 g via INTRAVENOUS
  Filled 2021-04-16: qty 50

## 2021-04-16 MED ORDER — ORAL CARE MOUTH RINSE
15.0000 mL | Freq: Two times a day (BID) | OROMUCOSAL | Status: DC
  Administered 2021-04-16 – 2021-04-19 (×6): 15 mL via OROMUCOSAL

## 2021-04-16 MED ORDER — HALOPERIDOL LACTATE 5 MG/ML IJ SOLN
5.0000 mg | Freq: Once | INTRAMUSCULAR | Status: AC
  Administered 2021-04-16: 5 mg via INTRAVENOUS
  Filled 2021-04-16: qty 1

## 2021-04-16 MED ORDER — QUETIAPINE FUMARATE 25 MG PO TABS
25.0000 mg | ORAL_TABLET | Freq: Every day | ORAL | Status: DC
  Administered 2021-04-16 – 2021-04-17 (×2): 25 mg via ORAL
  Filled 2021-04-16 (×2): qty 1

## 2021-04-16 MED ORDER — METOPROLOL TARTRATE 5 MG/5ML IV SOLN
5.0000 mg | Freq: Four times a day (QID) | INTRAVENOUS | Status: DC | PRN
  Administered 2021-04-16: 5 mg via INTRAVENOUS
  Filled 2021-04-16 (×2): qty 5

## 2021-04-16 MED ORDER — LORAZEPAM 0.5 MG PO TABS: 0.5000 mg | ORAL_TABLET | Freq: Four times a day (QID) | ORAL | Status: DC | PRN

## 2021-04-16 NOTE — Evaluation (Signed)
Physical Therapy Evaluation Patient Details Name: Joshua Henson MRN: [id number] DOB: [birth year] Today's Date: 04/16/2021   History of Present Illness  75 yo admitted 4/26 with PEA arrest and 20 min CPR for ROSC, suspected OD with cocaine,THC, benzo (+). Intubated 4/26-4/27. PMHx: polysubstance abuse, CAD, left frontal infarct 2015, HFpEF, HTN, depression, thoracic aortic aneurysm  Clinical Impression  Pt with eyes closed on arrival, easily aroused but difficulty maintaining attention requiring constant stimulus to maintain attention. Pt with decreased cognition, balance, function and strength who will benefit from acute therapy to maximize mobility safety and function to decrease burden of care.   HR 96 96% on 2L 124/57 (77)    Follow Up Recommendations CIR;Supervision/Assistance - 24 hour (pending progression)    Equipment Recommendations  Other (comment) (TBD)    Recommendations for Other Services       Precautions / Restrictions Precautions Precautions: Fall Restrictions Weight Bearing Restrictions: No      Mobility  Bed Mobility Overal bed mobility: Needs Assistance Bed Mobility: Supine to Sit     Supine to sit: Min assist     General bed mobility comments: HOB 10 degrees with pt able to transition to sitting with single UE assist and cues for sequence. Pt then impulsively returned to supine and required mod assist on second attempt to achieve sitting    Transfers Overall transfer level: Needs assistance   Transfers: Sit to/from Stand;Stand Pivot Transfers Sit to Stand: Min assist;+2 safety/equipment Stand pivot transfers: Mod assist;+2 safety/equipment       General transfer comment: min +2 bil UE support to stand x 2 from bed and x 1 from chair with assist for stability to pivot and pt sitting prematurely  Ambulation/Gait             General Gait Details: not yet able  Stairs            Wheelchair Mobility    Modified Rankin (Stroke  Patients Only)       Balance Overall balance assessment: Needs assistance Sitting-balance support: Bilateral upper extremity supported Sitting balance-Leahy Scale: Poor Sitting balance - Comments: min-minguard assist for sitting balance EOB grossly 5 min   Standing balance support: Bilateral upper extremity supported Standing balance-Leahy Scale: Poor Standing balance comment: bil UE support in standing                             Pertinent Vitals/Pain Pain Assessment: 0-10 Pain Location: generalized Pain Descriptors / Indicators: Aching Pain Intervention(s): Limited activity within patient's tolerance;Monitored during session;Repositioned    Home Living Family/patient expects to be discharged to:: Private residence Living Arrangements: Spouse/significant other   Type of Home: House Home Access: Stairs to enter   Technical brewer of Steps: 3 Home Layout: Two level        Prior Function Level of Independence: Independent         Comments: Pt reports he lives in a house with girlfriend Milton. Home setup taken from prior admission     Hand Dominance        Extremity/Trunk Assessment   Upper Extremity Assessment Upper Extremity Assessment: Generalized weakness    Lower Extremity Assessment Lower Extremity Assessment: Generalized weakness    Cervical / Trunk Assessment Cervical / Trunk Assessment: Kyphotic  Communication      Cognition Arousal/Alertness: Lethargic Behavior During Therapy: Flat affect;Impulsive Overall Cognitive Status: Impaired/Different from baseline Area of Impairment: Orientation;Attention;Memory;Following commands;Safety/judgement;Awareness;Problem solving  Orientation Level: Disoriented to;Time;Situation;Place Current Attention Level: Focused Memory: Decreased short-term memory Following Commands: Follows one step commands inconsistently;Follows one step commands with increased  time Safety/Judgement: Decreased awareness of safety;Decreased awareness of deficits Awareness: Intellectual Problem Solving: Requires tactile cues;Requires verbal cues;Slow processing General Comments: pt not oriented to place or situation, able to state correct year and when told he was at [facility] did state [place] as [place]. Unable to recall month or situation within session. Impulsive with mobility at times      General Comments      Exercises Total Joint Exercises Long Arc Quad: [place];Both;Seated;10 reps   Assessment/Plan    PT Assessment Patient needs continued PT services  PT Problem List Decreased strength;Decreased mobility;Decreased safety awareness;Decreased activity tolerance;Decreased balance;Decreased knowledge of use of DME;Decreased cognition;Cardiopulmonary status limiting activity;Decreased coordination       PT Treatment Interventions Gait training;Balance training;Functional mobility training;Therapeutic activities;Patient/family education;Cognitive remediation;Stair training;Neuromuscular re-education;Therapeutic exercise;DME instruction    PT Goals (Current goals can be found in the Care Plan section)  Acute Rehab PT Goals PT Goal Formulation: Patient unable to participate in goal setting Time For Goal Achievement: 04/30/21 Potential to Achieve Goals: Fair    Frequency Min 3X/week   Barriers to discharge        Co-evaluation               AM-PAC PT "6 Clicks" Mobility  Outcome Measure Help needed turning from your back to your side while in a flat bed without using bedrails?: A Little Help needed moving from lying on your back to sitting on the side of a flat bed without using bedrails?: A Little Help needed moving to and from a bed to a chair (including a wheelchair)?: A Lot Help needed standing up from a chair using your arms (e.g., wheelchair or bedside chair)?: A Lot Help needed to walk in hospital room?: Total Help needed climbing 3-5 steps  with a railing? : Total 6 Click Score: 12    End of Session   Activity Tolerance: Patient tolerated treatment well Patient left: in chair;with call bell/phone within reach;with chair alarm set Nurse Communication: Mobility status PT Visit Diagnosis: Other abnormalities of gait and mobility (R26.89);Difficulty in walking, not elsewhere classified (R26.2);Muscle weakness (generalized) (M62.81);Unsteadiness on feet (R26.81);Other symptoms and signs involving the nervous system (B35.329)    Time: 9242-6834 PT Time Calculation (min) (ACUTE ONLY): 17 min   Charges:   PT Evaluation $PT Eval Moderate Complexity: [place], PT Acute Rehabilitation Services Pager: [phone] Office: [phone]   Sandy Salaam Alejandra Hunt 04/16/2021, 9:24 AM

## 2021-04-16 NOTE — Progress Notes (Signed)
This chaplain responded to MD consult for spiritual care.    The chaplain reviewed the Pt. chart and was updated by the Pt. RN-Kara before the visit.  The chaplain will F/U with the consult for major life transition when the Pt. is able to participate.
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
## 2021-04-16 NOTE — Progress Notes (Signed)
Kara,RN made aware of the discontinue central line removal order.  She will removed line.  Lerry Liner, RN-VAST

## 2021-04-16 NOTE — Progress Notes (Signed)
TRH night shift.  The nursing staff reports that the patient continues to be restless despite getting a total of 5 mg of lorazepam recently per CIWA protocol.  He is oriented and not hallucinating, but at times want to get out of bed.  The staff also reports that the patient's blood pressure readings have been elevated.  Most recent EKG was NSR.  Haloperidol 5 mg IVP x1 dose, magnesium sulfate 2 g IVPB and metoprolol 5 mg IVP every 6 hours as needed ordered.  Tennis Must, MD.

## 2021-04-16 NOTE — Progress Notes (Signed)
Inpatient Rehab Admissions Coordinator Note:   Per PT recommendations, pt was screened for CIR candidacy by Shann Medal, PT, DPT.  At this time we are recommending CIR consult and I will place an order per our protocol.  Please contact me with questions.   Shann Medal, PT, DPT [phone] 04/16/21 9:49 AM

## 2021-04-16 NOTE — Evaluation (Signed)
Occupational Therapy Evaluation Patient Details Name: Joshua Henson MRN: [id number] DOB: 1946-11-02 Today's Date: 04/16/2021    History of Present Illness 75 yo admitted 4/26 with PEA arrest and 20 min CPR for ROSC, suspected OD with cocaine,THC, benzo (+). Intubated 4/26-4/27. PMHx: polysubstance abuse, CAD, left frontal infarct 2015, HFpEF, HTN, depression, thoracic aortic aneurysm   Clinical Impression   Patient admitted for the above diagnosis.  PTA he lived at home with a SO, who can assist as post acute.  Patient stated he generally walked without an assistive device, drove only locally, and was able to care for his self care, meds, meal prep and light home management.  Barriers are listed below.  Currently, he is needing up to [place] for basic transfers, and up to [place] for ADL support seated.  CIR has been recommended for post acute rehab.  Acute OT will follow to maximize his functional independence.      Follow Up Recommendations  CIR    Equipment Recommendations  3 in 1 bedside commode    Recommendations for Other Services       Precautions / Restrictions Precautions Precautions: Fall Restrictions Weight Bearing Restrictions: No Other Position/Activity Restrictions: LOA contiinues to fluctuate.      Mobility Bed Mobility Overal bed mobility: Needs Assistance Bed Mobility: Supine to Sit     Supine to sit: Supervision;HOB elevated       Patient Response: Cooperative  Transfers Overall transfer level: Needs assistance   Transfers: Sit to/from [employer] Transfers Sit to Stand: Min assist   Squat pivot transfers: Min assist          Balance Overall balance assessment: Needs assistance Sitting-balance support: Bilateral upper extremity supported Sitting balance-Leahy Scale: Fair     Standing balance support: Bilateral upper extremity supported Standing balance-Leahy Scale: Poor                             ADL either performed or  assessed with clinical judgement   ADL Overall ADL's : Needs assistance/impaired Eating/Feeding: Set up;Bed level   Grooming: Wash/dry hands;Wash/dry face;Set up;Sitting           Upper Body Dressing : Sitting;Minimal assistance   Lower Body Dressing: Maximal assistance;Sitting/lateral leans               Functional mobility during ADLs: Minimal assistance;Rolling walker       Vision Baseline Vision/History: Wears glasses Patient Visual Report: No change from baseline       Perception     Praxis      Pertinent Vitals/Pain Pain Assessment: Faces Faces Pain Scale: Hurts even more Pain Location: chest and upper ribs Pain Descriptors / Indicators: Grimacing;Guarding;Stabbing Pain Intervention(s): Monitored during session     Hand Dominance Right   Extremity/Trunk Assessment Upper Extremity Assessment Upper Extremity Assessment: Generalized weakness   Lower Extremity Assessment Lower Extremity Assessment: Defer to PT evaluation       Communication Communication Communication: HOH   Cognition Arousal/Alertness: Awake/alert Behavior During Therapy: WFL for tasks assessed/performed Overall Cognitive Status: Impaired/Different from baseline                   Orientation Level: Time;Situation (asked if he was at [facility]) Current Attention Level: Sustained   Following Commands: Follows one step commands consistently Safety/Judgement: Decreased awareness of safety Awareness: Emergent   General Comments: Patient was more alert upon entering - asking for urinal.   General  Comments   VSS on 2 L    Exercises     Shoulder Instructions      Home Living Family/patient expects to be discharged to:: Private residence Living Arrangements: Spouse/significant other Available Help at Discharge: Available 24 hours/day;Friend(s) Type of Home: House Home Access: Stairs to enter [employer] of Steps: 3   Home Layout: Two level     Bathroom  Shower/Tub: Occupational psychologist: Standard     Home Equipment: Shower seat;Cane - single point;Walker - 2 wheels          Prior Functioning/Environment Level of Independence: Independent                 OT Problem List: Decreased strength;Decreased activity tolerance;Impaired balance (sitting and/or standing);Decreased safety awareness;Pain      OT Treatment/Interventions: Self-care/ADL training;Therapeutic exercise;DME and/or AE instruction;Therapeutic activities;Cognitive remediation/compensation;Balance training    OT Goals(Current goals can be found in the care plan section) Acute Rehab OT Goals Patient Stated Goal: Return home and get better OT Goal Formulation: With patient Time For Goal Achievement: 04/30/21 Potential to Achieve Goals: Good ADL Goals Pt Will Perform Grooming: with supervision;standing Pt Will Perform Lower Body Bathing: with supervision;sit to/from stand Pt Will Perform Lower Body Dressing: with supervision;sit to/from stand Pt Will Transfer to Toilet: with supervision;ambulating;regular height toilet Pt Will Perform Toileting - Clothing Manipulation and hygiene: with supervision;sit to/from stand  OT Frequency: Min 2X/week   Barriers to D/C:    none noted       Co-evaluation              AM-PAC OT "6 Clicks" Daily Activity     Outcome Measure Help from another person eating meals?: None Help from another person taking care of personal grooming?: A Little Help from another person toileting, which includes using toliet, bedpan, or urinal?: A Little Help from another person bathing (including washing, rinsing, drying)?: A Lot Help from another person to put on and taking off regular upper body clothing?: A Little Help from another person to put on and taking off regular lower body clothing?: A Lot 6 Click Score: 17   End of Session Equipment Utilized During Treatment: Rolling walker;Oxygen  Activity Tolerance: Patient  limited by pain Patient left: in chair;with call bell/phone within reach;with chair alarm set  OT Visit Diagnosis: Unsteadiness on feet (R26.81);Muscle weakness (generalized) (M62.81);Pain Pain - Right/Left:  (sternal)                Time: 2482-5003 OT Time Calculation (min): 17 min Charges:  OT General Charges $OT Visit: 1 Visit OT Evaluation $OT Eval Moderate Complexity: 1 Mod  04/16/2021  Rich, OTR/L  Acute Rehabilitation Services  Office:  [phone]   Metta Clines 04/16/2021, 2:16 PM

## 2021-04-16 NOTE — Progress Notes (Addendum)
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) EtOH with prior alcoholic pancreatitis 8119, polysubstance (cocaine), Tobacco abuse CAD (cath 2017 mild RCA disease), Left frontal infarct 2015 HFpEF, Prior ascending thoracic aortic aneurysm diagnosed 2018 HTN,  Depression  Admit May 06, 2021 PEA arrest ROSC-suspected overdose substance-Rx Narcan intubated for airway protection Head CT no acute process UDS showed positive cocaine THC benzo Given Unasyn Rocephin Required Levophed initially  Extubated 4/27  Hospital-Problem based course  Status post PEA arrest with 20 minutes CPR with ROSC Patient having chest pain from compressions--seems positional Start tramadol low dose only  Mobilize with therapy services Elevated troponin-probably a consequence of CPR? Rpt Troponin  EKG nonspecific for Infarct--hold further work-up Polysubstance abuse UDS positive for cocaine and THC benzos Offer support with regards to cessation counseling--was on Ativan taper prior to admit for ethanol withdrawal STOP ALL BZD/NARCOTICS AT THIS TIME-REDIRECT WITH NON_PHARM METHODS Situational depression/adjustment disorder NOS Patient's wife has recurrent lymphoma Patient's in-laws have various cancers and children are estranged and lives in [place] claims he fell off the bandwagon several months ago because of distress Psych input pending Acute metabolic encephalopathy on admission Resolved currently AKI on admit Felt to be prerenal in origin Periodic labs ?  Pneumonia on admission Received Rocephin ending 4/28 Labs am    DVT prophylaxis: Heparin 3 times daily Code Status: Full code Family Communication: No family present Disposition:  Status is: Inpatient  Remains inpatient appropriate because:Hemodynamically unstable and Ongoing active pain requiring inpatient pain  management   Dispo: The patient is from: Home              Anticipated d/c is to: Home              Patient currently is not medically stable to d/c.   Difficult to place patient No       Consultants:   Assumed care from critical care  Procedures:   Antimicrobials: Rocephin until 4/27   Subjective:  Sleepy-in posey belt--rousable but received multiple meds last pm Food untouched at bedside-Cannot get ROS  Objective: Vitals:   04/16/21 0600 04/16/21 0700 04/16/21 0730 04/16/21 0746  BP: (!) 139/95  (!) 152/98   Pulse: 85 80 86   Resp: 17 18    Temp:    98 F (36.7 C)  TempSrc:    Oral  SpO2: 94% 95%    Weight:      Height:        Intake/Output Summary (Last 24 hours) at 04/16/2021 0843 Last data filed at 04/16/2021 0500 Gross per 24 hour  Intake 735.53 ml  Output 650 ml  Net 85.53 ml   Filed Weights   04/14/21 0200 04/15/21 0637 04/16/21 0410  Weight: 105.9 kg 109.4 kg 110.2 kg    Examination:  Sleepy GCS 13 cta b abd soft Cannot get ROS  Data Reviewed: personally reviewed   Data No labs t\oday   Radiology Studies: No results found.   Scheduled Meds: . atorvastatin  40 mg Oral Daily  . busPIRone  10 mg Oral BID  . Chlorhexidine Gluconate Cloth  6 each Topical Daily  . folic acid  1 mg Oral Daily  . gabapentin  200 mg Oral BID  . heparin  5,000 Units Subcutaneous Q8H  . mouth rinse  15 mL Mouth Rinse BID  . multivitamin with minerals  1 tablet Oral Daily  .  pantoprazole  40 mg Oral Daily  . thiamine  100 mg Oral Daily   Continuous Infusions: . sodium chloride Stopped (04/15/21 2126)  . cefTRIAXone (ROCEPHIN)  IV Stopped (04/15/21 2205)     LOS: 3 days   Time spent: 25  [name], MD [facility] To contact the attending provider between 7A-7P or the covering provider during after hours 7P-7A, please log into the web site [email or web address] and access using universal [place] password for that web site. If you do  not have the password, please call the hospital operator.  04/16/2021, 8:43 AM

## 2021-04-17 ENCOUNTER — Encounter (HOSPITAL_COMMUNITY): Payer: Self-pay | Admitting: Internal Medicine

## 2021-04-17 DIAGNOSIS — I469 Cardiac arrest, cause unspecified: Secondary | ICD-10-CM | POA: Diagnosis not present

## 2021-04-17 DIAGNOSIS — F4323 Adjustment disorder with mixed anxiety and depressed mood: Secondary | ICD-10-CM

## 2021-04-17 LAB — COMPREHENSIVE METABOLIC PANEL
ALT: 21 U/L (ref 0–44)
AST: 22 U/L (ref 15–41)
Albumin: 2.9 g/dL — ABNORMAL LOW (ref 3.5–5.0)
Alkaline Phosphatase: 56 U/L (ref 38–126)
Anion gap: 10 (ref 5–15)
BUN: 10 mg/dL (ref 8–23)
CO2: 25 mmol/L (ref 22–32)
Calcium: 8.8 mg/dL — ABNORMAL LOW (ref 8.9–10.3)
Chloride: 103 mmol/L (ref 98–111)
Creatinine, Ser: 0.88 mg/dL (ref 0.61–1.24)
GFR, Estimated: >60 mL/min (ref >60)
Glucose, Bld: 106 mg/dL — ABNORMAL HIGH (ref 70–99)
Potassium: 3.3 mmol/L — ABNORMAL LOW (ref 3.5–5.1)
Sodium: 138 mmol/L (ref 135–145)
Total Bilirubin: 1.3 mg/dL — ABNORMAL HIGH (ref 0.3–1.2)
Total Protein: 5.9 g/dL — ABNORMAL LOW (ref 6.5–8.1)

## 2021-04-17 LAB — CBC WITH DIFFERENTIAL/PLATELET
Abs Immature Granulocytes: 0.05 10*3/uL (ref 0.00–0.07)
Basophils Absolute: 0 10*3/uL (ref 0.0–0.1)
Basophils Relative: 0 %
Eosinophils Absolute: 0.1 10*3/uL (ref 0.0–0.5)
Eosinophils Relative: 1 %
HCT: 37.9 % — ABNORMAL LOW (ref 39.0–52.0)
Hemoglobin: 13.2 g/dL (ref 13.0–17.0)
Immature Granulocytes: 1 %
Lymphocytes Relative: 17 %
Lymphs Abs: 1.7 10*3/uL (ref 0.7–4.0)
MCH: 32.7 pg (ref 26.0–34.0)
MCHC: 34.8 g/dL (ref 30.0–36.0)
MCV: 93.8 fL (ref 80.0–100.0)
Monocytes Absolute: 1.3 10*3/uL — ABNORMAL HIGH (ref 0.1–1.0)
Monocytes Relative: 14 %
Neutro Abs: 6.5 10*3/uL (ref 1.7–7.7)
Neutrophils Relative %: 67 %
Platelets: 194 10*3/uL (ref 150–400)
RBC: 4.04 MIL/uL — ABNORMAL LOW (ref 4.22–5.81)
RDW: 15 % (ref 11.5–15.5)
WBC: 9.6 10*3/uL (ref 4.0–10.5)
nRBC: 0 % (ref 0.0–0.2)

## 2021-04-17 LAB — MAGNESIUM: Magnesium: 2.1 mg/dL (ref 1.7–2.4)

## 2021-04-17 MED ORDER — POTASSIUM CHLORIDE CRYS ER 20 MEQ PO TBCR
40.0000 meq | EXTENDED_RELEASE_TABLET | Freq: Once | ORAL | Status: AC
  Administered 2021-04-17: 40 meq via ORAL
  Filled 2021-04-17: qty 2

## 2021-04-17 MED ORDER — POLYETHYLENE GLYCOL 3350 17 G PO PACK
17.0000 g | PACK | Freq: Every day | ORAL | Status: DC
  Administered 2021-04-17 – 2021-04-19 (×2): 17 g via ORAL
  Filled 2021-04-17 (×2): qty 1

## 2021-04-17 MED ORDER — LORAZEPAM 0.5 MG PO TABS
0.5000 mg | ORAL_TABLET | Freq: Two times a day (BID) | ORAL | Status: DC | PRN
  Administered 2021-04-17 – 2021-04-18 (×3): 0.5 mg via ORAL
  Filled 2021-04-17 (×3): qty 1

## 2021-04-17 NOTE — Progress Notes (Addendum)
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) EtOH with prior alcoholic pancreatitis 5462, polysubstance (cocaine), Tobacco abuse CAD (cath 2017 mild RCA disease), Left frontal infarct 2015 HFpEF, Prior ascending thoracic aortic aneurysm diagnosed 2018 HTN,  Depression  Admit 05-05-2021 PEA arrest ROSC-suspected overdose substance-Rx Narcan intubated for airway protection Head CT no acute process UDS showed positive cocaine THC benzo Given Unasyn Rocephin Required Levophed initially  Extubated 4/27  He is currently awaiting therapy evaluation and is being monitored to ensure he is not withdrawing from alcohol  Hospital-Problem based course  Status post PEA arrest with 20 minutes CPR with ROSC chest pain from compressions--seems positional Start tramadol low dose only ---will taper in several days Mobilize with therapy services Elevated troponin-probably a consequence of CPR? Rpt Troponin  EKG nonspecific for Infarct--hold further work-up at this time Polysubstance abuse UDS positive for cocaine and THC benzos Offer support with regards to cessation counseling- Discontinue CIWA--last as needed's given 4/29 early a.m. Ativan changed to every 12 as needed anxiety Continue gabapentin 200 twice daily for withdrawal support Situational depression/adjustment disorder NOS Patient has several psychosocial stressors I have asked chaplain to talk with patient if he feels the requirement of the same He is sleepy in the mornings-May need adjustment by psychiatry on his dose of Seroquel to 12.5 daily-appreciate in advance further input from them--thanks Continue BuSpar 10 twice daily Acute metabolic encephalopathy on admission Resolved currently AKI on admit Felt to be prerenal in origin Azotemia is improved from admission Mild hypokalemia Replacing ?   Pneumonia on admission Received Rocephin ending 4/28 No white count no fever    DVT prophylaxis: Heparin 3 times daily Code Status: Full code Family Communication: No family present Disposition:  Status is: Inpatient  Remains inpatient appropriate because:Hemodynamically unstable and Ongoing active pain requiring inpatient pain management   Dispo: The patient is from: Home              Anticipated d/c is to: CIR              Patient currently is not medically stable to d/c.   Difficult to place patient No       Consultants:   Assumed care from critical care  Procedures:   Antimicrobials: Rocephin until 4/27   Subjective:  Much more arousable States that pain in the chest worsens every time he moves his arms or sits up I reassured him that the pain is not cardiac but from his resuscitation He seems less withdrawn today somewhat interactive No fevers no chills   Objective: Vitals:   04/16/21 2000 04/17/21 0027 04/17/21 0415 04/17/21 0809  BP: (!) 166/97 (!) 155/79 (!) 151/94 (!) 173/95  Pulse: 91 63 94 93  Resp: 18 18 17 18   Temp: 98.2 F (36.8 C) 98.4 F (36.9 C) 98.2 F (36.8 C) 98.2 F (36.8 C)  TempSrc: Oral Oral Oral Oral  SpO2: 96% 100% 100% 95%  Weight:   105.3 kg   Height:        Intake/Output Summary (Last 24 hours) at 04/17/2021 0820 Last data filed at 04/17/2021 0600 Gross per 24 hour  Intake 560 ml  Output 420 ml  Net 140 ml   Filed Weights   04/15/21 0637 04/16/21 0410 04/17/21 0415  Weight: 109.4 kg 110.2 kg 105.3 kg    Examination:  Arousable coherent no distress On oxygen  however does not desat when I take him off Chest clear no rales rhonchi Very difficult for him to sit up in the bed-has significant musculoskeletal pain in the chest Abdomen soft no rebound no guarding No lower extremity edema Affect somewhat flat but better than prior No rash   Data Reviewed: personally reviewed   Data No labs t\oday   Radiology  Studies: No results found.   Scheduled Meds: . busPIRone  10 mg Oral BID  . folic acid  1 mg Oral Daily  . gabapentin  200 mg Oral BID  . heparin  5,000 Units Subcutaneous Q8H  . mouth rinse  15 mL Mouth Rinse BID  . multivitamin with minerals  1 tablet Oral Daily  . pantoprazole  40 mg Oral Daily  . polyethylene glycol  17 g Oral Daily  . potassium chloride  40 mEq Oral Once  . QUEtiapine  25 mg Oral QHS  . thiamine  100 mg Oral Daily   Continuous Infusions: . sodium chloride Stopped (04/15/21 2126)     LOS: 4 days   Time spent: 20  [name], MD [facility] To contact the attending provider between 7A-7P or the covering provider during after hours 7P-7A, please log into the web site [email or web address] and access using universal [place] password for that web site. If you do not have the password, please call the hospital operator.  04/17/2021, 8:20 AM

## 2021-04-17 NOTE — Consult Note (Signed)
[facility] Face-to-Face Psychiatry Consult  Reason for Consult: ''situational depression-Adjust disorder NOS needing med counseling--on Bupropion.''  Referring Physician: [name], MD Patient Identification: Joshua Henson MRN:  [id number] Principal Diagnosis: Adjustment disorder with mixed anxiety and depressed mood Diagnosis:  Principal Problem:   Adjustment disorder with mixed anxiety and depressed mood Active Problems:   Cardiac arrest ([place])   Total Time spent with patient: 1 hour  Subjective:   Joshua Henson is a 75 y.o. 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 Patient was brought to the hospital due to cardiac arrest and suspected drug overdose(u-Tox on admission positive for Cocaine, Benzo and THC). Today, patient is alert, awake, cooperative and oriented to time, place, person and situation. However, patient reports that he is not able to recollect how he got to the hospital and does not want to dwell on why illicit substances were found in his urine but denies trying to kill himself. He reports being under a lot of stress which has caused him anxiety/depression but says he will never take his life:''I have 3 children that I love as well as my wife, I am not going to do that to them.'' He states that his wife was out of town and made a mistake to mess with illicit drugs but claim to have learned his lessons. Currently, he denies psychosis, delusions and self harming thoughts. He will like to be referred back to his PCP Dr. Clovia Cuff who prescribe him Mirtazapine and Buspirone for Depression/anxiety.   Past Psychiatric History: as above  Risk to Self:  denies Risk to Others:  denies Prior Inpatient Therapy:  denies Prior Outpatient Therapy:  Dr. Clovia Cuff  Past Medical History:  Past Medical History:  Diagnosis  Date  . Acute hepatic encephalopathy   . Alcohol abuse   . Alcoholic cirrhosis of liver without ascites ([place])   . Anxiety state   . CAD (coronary artery disease), native coronary artery 01/10/2016   a. 12/2015 Cath: LM nl, LAD 53m, LCX small, nl, OM2 20, OM4 mod, nl, LPDA nl, RCA 60/79mid - nondominant-->Med Rx.  . Depression   . Diastolic dysfunction    a. 12/2015 Echo: Ef 55%, Gr1 DD; b. 06/2018 Echo: EF 60-65%, no rwma, Gr1 DD.  Marland Kitchen Fatty liver, alcoholic 7/67/2094  . Hypertension   . Increased ammonia level   . Insomnia   . NSTEMI (non-ST elevated myocardial infarction) ([place])    a. 12/2015 elevated trop in setting of etoh w/d and pna->nonobs dzs on cath->Med Rx; b. 06/2018 Elev trop in setting of etoh w/d->Echo w/ nl EF->Med Rx.  . Prostate cancer ([place]) 2010   External beam radiation (urol - Risa Grill, XRT Valere Dross)  . Sinus tachycardia    a. 06/2018 Echo: EF 60-65%.  . Stroke [facility])     Past Surgical History:  Procedure Laterality Date  . CARDIAC CATHETERIZATION N/A 01/15/2016   Procedure: Left Heart Cath and Coronary Angiography;  Surgeon: Burnell Blanks, MD;  Location: [facility];  Service: Cardiovascular;  Laterality: N/A;   Family History:  Family History  Problem Relation Age of Onset  . Hypertension Mother   . Diabetes Mother   . Esophageal cancer Mother 8  . Prostate cancer Father 71  . Heart disease Maternal Grandfather        MI  . Heart attack Maternal Grandfather   .  Prostate cancer Paternal Grandfather    Family Psychiatric  History:  Social History:  Social History   Substance and Sexual Activity  Alcohol Use Yes  . Alcohol/week: 56.0 standard drinks  . Types: 54 Shots of liquor per week   Comment: last drink yesterday     Social History   Substance and Sexual Activity  Drug Use Yes  . Types: Marijuana   Comment: smokes intermittently.    Social History   Socioeconomic History  . Marital status: Divorced    Spouse name: Not on file  .  Number of children: 3  . Years of education: 18  . Highest education level: Not on file  Occupational History  . Occupation: Chief Executive Officer  Tobacco Use  . Smoking status: Current Every Day Smoker    Packs/day: 0.50    Years: 30.00    Pack years: 15.00    Types: Cigarettes  . Smokeless tobacco: Never Used  Vaping Use  . Vaping Use: Never used  Substance and Sexual Activity  . Alcohol use: Yes    Alcohol/week: 56.0 standard drinks    Types: 56 Shots of liquor per week    Comment: last drink yesterday  . Drug use: Yes    Types: Marijuana    Comment: smokes intermittently.  Marland Kitchen Sexual activity: Yes  Other Topics Concern  . Not on file  Social History Narrative   HSG, [place], [place]. Married 22 yrs yrs - divorced. Twin boys - 90; 1 dtr - '94. Pension scheme manager. Lives in [place] with long term girlfriend.  Does not routinely exercise.   Social Determinants of Health   Financial Resource Strain: Not on file  Food Insecurity: Not on file  Transportation Needs: Not on file  Physical Activity: Not on file  Stress: Not on file  Social Connections: Not on file   Additional Social History:    Allergies:   Allergies  Allergen Reactions  . Lisinopril Other (See Comments)    syncope  . Celecoxib Rash    Labs:  Results for orders placed or performed during the hospital encounter of 03/22/2021 (from the past 48 hour(s))  Glucose, capillary     Status: Abnormal   Collection Time: 04/15/21  4:23 PM  Result Value Ref Range   Glucose-Capillary 122 (H) 70 - 99 mg/dL    Comment: Glucose reference range applies only to samples taken after fasting for at least 8 hours.  Troponin I (High Sensitivity)     Status: Abnormal   Collection Time: 04/15/21  5:00 PM  Result Value Ref Range   Troponin I (High Sensitivity) 92 (H) <18 ng/L    Comment: RESULT CALLED TO, READ BACK BY AND VERIFIED WITH: A KING,RN 1816 04/15/2021 WBOND (NOTE) Elevated high sensitivity troponin I (hsTnI) values  and significant  changes across serial measurements may suggest ACS but many other  chronic and acute conditions are known to elevate hsTnI results.  Refer to the Links section for chest pain algorithms and additional  guidance. Performed at [facility], [place] [address] [place], [place] [address]   Glucose, capillary     Status: Abnormal   Collection Time: 04/15/21  7:19 PM  Result Value Ref Range   Glucose-Capillary 122 (H) 70 - 99 mg/dL    Comment: Glucose reference range applies only to samples taken after fasting for at least 8 hours.  Glucose, capillary     Status: Abnormal   Collection Time: 04/15/21 11:39 PM  Result  Value Ref Range   Glucose-Capillary 127 (H) 70 - 99 mg/dL    Comment: Glucose reference range applies only to samples taken after fasting for at least 8 hours.  Glucose, capillary     Status: Abnormal   Collection Time: 04/16/21  3:35 AM  Result Value Ref Range   Glucose-Capillary 135 (H) 70 - 99 mg/dL    Comment: Glucose reference range applies only to samples taken after fasting for at least 8 hours.  Comprehensive metabolic panel     Status: Abnormal   Collection Time: 04/17/21  3:52 AM  Result Value Ref Range   Sodium 138 135 - 145 mmol/L   Potassium 3.3 (L) 3.5 - 5.1 mmol/L   Chloride 103 98 - 111 mmol/L   CO2 25 22 - 32 mmol/L   Glucose, Bld 106 (H) 70 - 99 mg/dL    Comment: Glucose reference range applies only to samples taken after fasting for at least 8 hours.   BUN 10 8 - 23 mg/dL   Creatinine, Ser 0.88 0.61 - 1.24 mg/dL   Calcium 8.8 (L) 8.9 - 10.3 mg/dL   Total Protein 5.9 (L) 6.5 - 8.1 g/dL   Albumin 2.9 (L) 3.5 - 5.0 g/dL   AST 22 15 - 41 U/L   ALT 21 0 - 44 U/L   Alkaline Phosphatase 56 38 - 126 U/L   Total Bilirubin 1.3 (H) 0.3 - 1.2 mg/dL   GFR, Estimated >60 >60 mL/min    Comment: (NOTE) Calculated using the CKD-EPI Creatinine Equation (2021)    Anion gap 10 5 - 15    Comment: Performed at [facility], [place] [address], [place], [place] [address]  Magnesium     Status: None   Collection Time: 04/17/21  3:52 AM  Result Value Ref Range   Magnesium 2.1 1.7 - 2.4 mg/dL    Comment: Performed at [place] [address] [place], [place] [address]  CBC with Differential/Platelet     Status: Abnormal   Collection Time: 04/17/21  3:52 AM  Result Value Ref Range   WBC 9.6 4.0 - 10.5 K/uL   RBC 4.04 (L) 4.22 - 5.81 MIL/uL   Hemoglobin 13.2 13.0 - 17.0 g/dL   HCT 37.9 (L) 39.0 - 52.0 %   MCV 93.8 80.0 - 100.0 fL   MCH 32.7 26.0 - 34.0 pg   MCHC 34.8 30.0 - 36.0 g/dL    Comment: CORRECTED FOR COLD AGGLUTININS   RDW 15.0 11.5 - 15.5 %   Platelets 194 150 - 400 K/uL   nRBC 0.0 0.0 - 0.2 %   Neutrophils Relative % 67 %   Neutro Abs 6.5 1.7 - 7.7 K/uL   Lymphocytes Relative 17 %   Lymphs Abs 1.7 0.7 - 4.0 K/uL   Monocytes Relative 14 %   Monocytes Absolute 1.3 (H) 0.1 - 1.0 K/uL   Eosinophils Relative 1 %   Eosinophils Absolute 0.1 0.0 - 0.5 K/uL   Basophils Relative 0 %   Basophils Absolute 0.0 0.0 - 0.1 K/uL   Immature Granulocytes 1 %   Abs Immature Granulocytes 0.05 0.00 - 0.07 K/uL    Comment: Performed at [facility], [place] [address], [place], [place] [address]    Current Facility-Administered Medications  Medication Dose Route Frequency Provider Last Rate Last Admin  . 0.9 %  sodium chloride infusion   Intravenous PRN Etheleen Nicks, MD   Stopped at 04/15/21 2126  .  acetaminophen (TYLENOL) tablet 650 mg  650 mg Oral Q6H PRN Estill Cotta, NP   650 mg at 04/17/21 3716  . busPIRone (BUSPAR) tablet 10 mg  10 mg Oral BID Estill Cotta, NP   10 mg at 04/17/21 0840  . folic acid (FOLVITE) tablet 1 mg  1 mg Oral Daily Estill Cotta, NP   1 mg at 04/17/21 0841  . gabapentin (NEURONTIN) capsule 200 mg  200 mg Oral BID Estill Cotta, NP   200 mg at 04/17/21 0840  . heparin injection 5,000 Units  5,000 Units Subcutaneous Q8H Etheleen Nicks, MD   5,000 Units at 04/17/21 0540  .  LORazepam (ATIVAN) tablet 0.5 mg  0.5 mg Oral Q12H PRN [name], MD      . MEDLINE mouth rinse  15 mL Mouth Rinse BID [name], MD   15 mL at 04/16/21 2144  . multivitamin with minerals tablet 1 tablet  1 tablet Oral Daily Jennelle Human B, NP   1 tablet at 04/17/21 0840  . pantoprazole (PROTONIX) EC tablet 40 mg  40 mg Oral Daily Einar Grad, RPH   40 mg at 04/17/21 0840  . polyethylene glycol (MIRALAX / GLYCOLAX) packet 17 g  17 g Oral Daily [name], MD   17 g at 04/17/21 0840  . QUEtiapine (SEROQUEL) tablet 25 mg  25 mg Oral QHS Suella Broad, FNP   25 mg at 04/16/21 2144  . sodium chloride flush (NS) 0.9 % injection 10-40 mL  10-40 mL Intracatheter PRN Etheleen Nicks, MD      . thiamine tablet 100 mg  100 mg Oral Daily Estill Cotta, NP   100 mg at 04/17/21 0839  . traMADol (ULTRAM) tablet 50 mg  50 mg Oral Q6H PRN [name], MD   50 mg at 04/17/21 1007    Musculoskeletal: Strength & Muscle Tone: within normal limits Gait & Station: normal Patient leans: N/A   Psychiatric Specialty Exam:  Presentation  General Appearance: Appropriate for Environment  Eye Contact:Good  Speech:Normal Rate  Speech Volume:Normal  Handedness:Right   Mood and Affect  Mood:Anxious  Affect:Appropriate   Thought Process  Thought Processes:Coherent  Descriptions of Associations:Intact  Orientation:Full (Time, Place and Person)  Thought Content:Logical  History of Schizophrenia/Schizoaffective disorder:No data recorded Duration of Psychotic Symptoms:No data recorded Hallucinations:Hallucinations: None  Ideas of Reference:None  Suicidal Thoughts:Suicidal Thoughts: No  Homicidal Thoughts:Homicidal Thoughts: No   Sensorium  Memory:Immediate Good; Recent Good; Remote Good  Judgment:Fair  Insight:Fair   Executive Functions  Concentration:Fair  Attention Span:Good  [place] of  Knowledge:Good  Language:Good   Psychomotor Activity  Psychomotor Activity:Psychomotor Activity: Normal   Assets  Assets:Communication Skills   Sleep  Sleep:Sleep: Fair   Physical Exam: Physical Exam Psychiatric:        Attention and Perception: Attention normal.        Mood and Affect: Mood is anxious.        Speech: Speech normal.        Behavior: Behavior normal. Behavior is cooperative.        Thought Content: Thought content normal.        Cognition and Memory: Cognition and memory normal.        Judgment: Judgment normal.    Review of Systems  Psychiatric/Behavioral: The patient is nervous/anxious.    Blood pressure (!) 159/102, pulse 93, temperature 98.3 F (36.8 C), temperature source Oral, resp. rate 18, height 5\' 10"  (  1.778 m), weight 105.3 kg, SpO2 97 %. Body mass index is 33.31 kg/m.  Treatment Plan Summary: 75 year old male with multiple medical issues, depression, anxiety who was admitted with cardiac arrest in the setting of illicit drug use. Today, patient is alert, oriented, awake, cooperative, denies psychosis, delusions and self harming thoughts.   Recommendations: -Continue Buspirone, Gabapentin for anxiety and mood -Consider referring patient to Dr. Clovia Cuff for outpatient medication management-per patient request(he declined referral to psychiatrist or therapist).  Disposition: No evidence of imminent risk to self or others at present.   Patient does not meet criteria for psychiatric inpatient admission. Supportive therapy provided about ongoing stressors. Psychiatric service signing off. Re-consult as needed  Corena Pilgrim, MD 04/17/2021 12:12 PM

## 2021-04-18 ENCOUNTER — Encounter (HOSPITAL_COMMUNITY): Payer: Self-pay | Admitting: Internal Medicine

## 2021-04-18 DIAGNOSIS — 419620001 Death: Secondary | SNOMED CT | POA: Diagnosis not present

## 2021-04-18 DIAGNOSIS — F4323 Adjustment disorder with mixed anxiety and depressed mood: Secondary | ICD-10-CM

## 2021-04-18 LAB — COMPREHENSIVE METABOLIC PANEL
ALT: 24 U/L (ref 0–44)
AST: 25 U/L (ref 15–41)
Albumin: 2.7 g/dL — ABNORMAL LOW (ref 3.5–5.0)
Alkaline Phosphatase: 60 U/L (ref 38–126)
Anion gap: 7 (ref 5–15)
BUN: 11 mg/dL (ref 8–23)
CO2: 28 mmol/L (ref 22–32)
Calcium: 8.8 mg/dL — ABNORMAL LOW (ref 8.9–10.3)
Chloride: 105 mmol/L (ref 98–111)
Creatinine, Ser: 0.91 mg/dL (ref 0.61–1.24)
GFR, Estimated: >60 mL/min (ref >60)
Glucose, Bld: 104 mg/dL — ABNORMAL HIGH (ref 70–99)
Potassium: 3.7 mmol/L (ref 3.5–5.1)
Sodium: 140 mmol/L (ref 135–145)
Total Bilirubin: 1.1 mg/dL (ref 0.3–1.2)
Total Protein: 5.6 g/dL — ABNORMAL LOW (ref 6.5–8.1)

## 2021-04-18 LAB — GLUCOSE, CAPILLARY
Glucose-Capillary: 104 mg/dL — ABNORMAL HIGH (ref 70–99)
Glucose-Capillary: 110 mg/dL — ABNORMAL HIGH (ref 70–99)
Glucose-Capillary: 175 mg/dL — ABNORMAL HIGH (ref 70–99)

## 2021-04-18 MED ORDER — IBUPROFEN 200 MG PO TABS
400.0000 mg | ORAL_TABLET | Freq: Four times a day (QID) | ORAL | Status: DC | PRN
  Administered 2021-04-18 – 2021-04-19 (×2): 400 mg via ORAL
  Filled 2021-04-18 (×2): qty 2

## 2021-04-18 MED ORDER — QUETIAPINE FUMARATE 25 MG PO TABS
12.5000 mg | ORAL_TABLET | Freq: Every day | ORAL | Status: DC
  Administered 2021-04-18: 12.5 mg via ORAL
  Filled 2021-04-18: qty 1

## 2021-04-18 MED ORDER — TRAMADOL HCL 50 MG PO TABS
50.0000 mg | ORAL_TABLET | Freq: Two times a day (BID) | ORAL | Status: DC | PRN
  Administered 2021-04-18 – 2021-04-19 (×2): 50 mg via ORAL
  Filled 2021-04-18 (×2): qty 1

## 2021-04-18 NOTE — Progress Notes (Signed)
Inpatient Rehab Admissions Coordinator:   CIR consult received. Pt. Appears to be an appropriate candidate for CIR, but I have been unable to reach any family to confirm support at d/c. I have left voicemail with Pt.'s significant other and await callback. AC will continue to attempt to contact family to confirm support.   Clemens Catholic, [place], [place] Admissions Coordinator  [phone] ([place]) [phone] (office)

## 2021-04-18 NOTE — Progress Notes (Signed)
Noted redness and swelling on LAC IV site. Pt c/o tendernes to touch. IV SL removed. LA elevated,Cold compress applied, Left message to Dr Verlon Au. Endorsed to [facility] for follow up.

## 2021-04-18 NOTE — Progress Notes (Signed)
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) EtOH with prior alcoholic pancreatitis 0630, polysubstance (cocaine), Tobacco abuse CAD (cath 2017 mild RCA disease), Left frontal infarct 2015 HFpEF, Prior ascending thoracic aortic aneurysm diagnosed 2018 HTN,  Depression  Admit Apr 20, 2021 PEA arrest ROSC-suspected overdose substance-Rx Narcan intubated for airway protection Head CT no acute process UDS showed positive cocaine THC benzo Given Unasyn Rocephin Required Levophed initially  Extubated 4/27  He is currently awaiting therapy evaluation and is being monitored to ensure he is not withdrawing from alcohol  Hospital-Problem based course  Status post PEA arrest with 20 minutes CPR with ROSC chest pain from compressions--seems positional Start tramadol low dose--tapered to every 12 as needed-ibuprofen scheduled for pain Mobilize with therapy services Elevated troponin--patient did have CPR Rpt Troponin trended downwards EKG nonspecific for Infarct--hold further work-up at this time Chest pain clearly musculoskeletal Polysubstance abuse UDS positive for cocaine and THC benzos Offer support with regards to cessation counseling Discontinue CIWA--last as needed's given 4/29 early a.m. Continue every 12 as needed Ativan Continue gabapentin 200 twice daily for withdrawal support Situational depression/adjustment disorder NOS Patient has several psychosocial stressors I have asked chaplain to talk with patient if he feels the requirement of the same Occasionally sleepy in the mornings Cut back Seroquel to 12.5 daily with aim to this charge without this medication Continue BuSpar 10 twice daily Acute metabolic encephalopathy on admission Resolved currently AKI on admit Felt to be prerenal in origin Azotemia is improved from admission Mild  hypokalemia Replacing ?  Pneumonia on admission Received Rocephin ending 4/28 No white count no fever    DVT prophylaxis: Heparin 3 times daily Code Status: Full code Family Communication: No family present Disposition:  Status is: Inpatient  Remains inpatient appropriate because:Hemodynamically unstable and Ongoing active pain requiring inpatient pain management   Dispo: The patient is from: Home              Anticipated d/c is to: CIR-backup plan skilled facility placement in the next 24 to 48 hours              Patient currently is medically stable to d/c.   Difficult to place patient No       Consultants:   Assumed care from critical care  Procedures:   Antimicrobials: Rocephin until 4/27   Subjective:  Awake coherent more interactive Patient IV in left arm infiltrated-swelling seems to have gone down according to nursing No chest pain no fever Has not mobilized out of the bed yet this morning    Objective: Vitals:   04/18/21 0452 04/18/21 0550 04/18/21 0744 04/18/21 1132  BP: 131/89  137/85 130/73  Pulse: 79  91 82  Resp: 18  18 18   Temp: 98.3 F (36.8 C)  98.5 F (36.9 C) 98.5 F (36.9 C)  TempSrc: Oral  Oral Oral  SpO2: 98%  99% 97%  Weight:  101.5 kg    Height:  5\' 7"  (1.702 m)      Intake/Output Summary (Last 24 hours) at 04/18/2021 1334 Last data filed at 04/18/2021 0444 Gross per 24 hour  Intake 1384.86 ml  Output 550 ml  Net 834.86 ml   Filed Weights   04/16/21 0410 04/17/21 0415 04/18/21 0550  Weight: 110.2 kg 105.3 kg 101.5 kg    Examination:  No distress EOMI NCAT no focal deficit-on oxygen Chest clear  no added sound no rales no rhonchi Abdomen soft no rebound no guarding No lower extremity edema Psych euthymic congruent  Data Reviewed: personally reviewed   BUNs/creatinine 11/0.9 Albumin 2.7   Radiology Studies: No results found.   Scheduled Meds: . busPIRone  10 mg Oral BID  . folic acid  1 mg Oral Daily  .  gabapentin  200 mg Oral BID  . heparin  5,000 Units Subcutaneous Q8H  . mouth rinse  15 mL Mouth Rinse BID  . multivitamin with minerals  1 tablet Oral Daily  . pantoprazole  40 mg Oral Daily  . polyethylene glycol  17 g Oral Daily  . QUEtiapine  12.5 mg Oral QHS  . thiamine  100 mg Oral Daily   Continuous Infusions: . sodium chloride Stopped (04/15/21 2126)     LOS: 5 days   Time spent: 15  Joshua Sells, MD [facility] To contact the attending provider between 7A-7P or the covering provider during after hours 7P-7A, please log into the web site [email or web address] and access using universal [place] password for that web site. If you do not have the password, please call the hospital operator.  04/18/2021, 1:34 PM

## 2021-04-18 NOTE — Progress Notes (Signed)
Spoke with Dr. Verlon Au regarding starting IV when pt. is not getting anything through IV. He said we could leave IV out for now.

## 2021-04-18 DEATH — deceased

## 2021-04-19 DIAGNOSIS — F4323 Adjustment disorder with mixed anxiety and depressed mood: Secondary | ICD-10-CM | POA: Diagnosis not present

## 2021-04-19 LAB — GLUCOSE, CAPILLARY
Glucose-Capillary: 101 mg/dL — ABNORMAL HIGH (ref 70–99)
Glucose-Capillary: 118 mg/dL — ABNORMAL HIGH (ref 70–99)

## 2021-04-19 MED ORDER — PANTOPRAZOLE SODIUM 40 MG PO TBEC: 40.0000 mg | DELAYED_RELEASE_TABLET | Freq: Every day | ORAL | 0 refills | Status: AC

## 2021-04-19 MED ORDER — GABAPENTIN 100 MG PO CAPS: 200.0000 mg | ORAL_CAPSULE | Freq: Two times a day (BID) | ORAL | 0 refills | Status: AC

## 2021-04-19 MED ORDER — TAMSULOSIN HCL 0.4 MG PO CAPS
0.4000 mg | ORAL_CAPSULE | Freq: Every day | ORAL | Status: DC
  Administered 2021-04-19: 0.4 mg via ORAL
  Filled 2021-04-19: qty 1

## 2021-04-19 MED ORDER — TRAMADOL HCL 50 MG PO TABS
50.0000 mg | ORAL_TABLET | Freq: Four times a day (QID) | ORAL | Status: DC | PRN
  Administered 2021-04-19 (×2): 50 mg via ORAL
  Filled 2021-04-19 (×2): qty 1

## 2021-04-19 MED ORDER — TRAMADOL HCL 50 MG PO TABS: 50.0000 mg | ORAL_TABLET | Freq: Four times a day (QID) | ORAL | 0 refills | Status: AC | PRN

## 2021-04-19 MED ORDER — IBUPROFEN 400 MG PO TABS: 400.0000 mg | ORAL_TABLET | Freq: Four times a day (QID) | ORAL | 0 refills | Status: AC | PRN

## 2021-04-19 MED ORDER — CARVEDILOL 12.5 MG PO TABS
12.5000 mg | ORAL_TABLET | Freq: Two times a day (BID) | ORAL | Status: DC
  Administered 2021-04-19: 12.5 mg via ORAL
  Filled 2021-04-19: qty 1

## 2021-04-19 MED ORDER — MIRTAZAPINE 7.5 MG PO TABS
7.5000 mg | ORAL_TABLET | Freq: Every day | ORAL | Status: DC
  Filled 2021-04-19: qty 1

## 2021-04-19 MED ORDER — BUSPIRONE HCL 5 MG PO TABS: 10.0000 mg | ORAL_TABLET | Freq: Two times a day (BID) | ORAL | Status: DC

## 2021-04-19 MED ORDER — THIAMINE HCL 100 MG PO TABS: 100.0000 mg | ORAL_TABLET | Freq: Every day | ORAL | 0 refills | Status: AC

## 2021-04-19 MED ORDER — QUETIAPINE FUMARATE 25 MG PO TABS: 12.5000 mg | ORAL_TABLET | Freq: Every day | ORAL | 0 refills | Status: AC

## 2021-04-19 NOTE — Progress Notes (Signed)
Inpatient Rehabilitation Admissions Coordinator  I met with patient and his girlfriend at bedside. He is doing well and not in need of an inpt rehab admit. I have alerted SW, Ebony Hail and we will sign off at this time.  Danne Baxter, RN, MSN Rehab Admissions Coordinator [phone] 04/19/2021 3:31 PM

## 2021-04-19 NOTE — Progress Notes (Signed)
Inpatient Rehabilitation Admissions Coordinator  I await updated PT and OT progress today to assist with planning dispo options. OT and I have discussed today and I wait PT.   Danne Baxter, RN, MSN Rehab Admissions Coordinator [phone] 04/19/2021 2:21 PM

## 2021-04-19 NOTE — TOC Progression Note (Signed)
Transition of Care [facility]) - Progression Note    Patient Details  Name: Joshua Henson MRN: [id number] Date of Birth: 1946-02-09  Transition of Care [facility]) CM/SW [place], [place] Phone Number: 04/19/2021, 4:05 PM  Clinical Narrative:     Hadassah Pais with CIR called CSW and confirmed patients plan is to go home. CSW updated Case manager. CSW will continue to follow and assist with discharge planning needs.          Expected Discharge Plan and Services                                                 Social Determinants of Health (SDOH) Interventions    Readmission Risk Interventions No flowsheet data found.

## 2021-04-19 NOTE — Progress Notes (Signed)
Occupational Therapy Treatment Patient Details Name: Joshua Henson MRN: [id number] DOB: July 26, 1946 Today's Date: 04/19/2021    History of present illness 75 yo admitted 4/26 with PEA arrest and 20 min CPR for ROSC, suspected OD with cocaine,THC, benzo (+). Intubated 4/26-4/27. PMHx: polysubstance abuse, CAD, left frontal infarct 2015, HFpEF, HTN, depression, thoracic aortic aneurysm   OT comments  Pt making good progress with functional goals. Pt has shown good improvement since initial eval and states that he would like to return home. Session focused on sitting EOB, sit - stand transitions to RW, ADL mobility using RW, toilet and shower transfers, UB and LB ADLs. OT will continue to follow acutely to maximize level of function and safety  Follow Up Recommendations  Home health OT;Supervision - Intermittent    Equipment Recommendations  None recommended by OT    Recommendations for Other Services      Precautions / Restrictions Precautions Precautions: Fall Restrictions Weight Bearing Restrictions: No       Mobility Bed Mobility Overal bed mobility: Needs Assistance Bed Mobility: Rolling;Sidelying to Sit;Sit to Sidelying Rolling: Supervision Sidelying to sit: Supervision     Sit to sidelying: Supervision General bed mobility comments: pt instructed on log roll technqiue to decrease pain for sitting EOB transitions    Transfers Overall transfer level: Needs assistance Equipment used: Rolling walker (2 wheeled) Transfers: Sit to/from [employer] Sit to Stand: Min guard Stand pivot transfers: Min guard       General transfer comment: cues for hand placement    Balance Overall balance assessment: Needs assistance Sitting-balance support: Bilateral upper extremity supported Sitting balance-Leahy Scale: Good     Standing balance support: Bilateral upper extremity supported;During functional activity Standing balance-Leahy Scale: Poor                              ADL either performed or assessed with clinical judgement   ADL Overall ADL's : Needs assistance/impaired     Grooming: Wash/dry hands;Wash/dry face;Standing;Min guard   Upper Body Bathing: Set up;Sitting   Lower Body Bathing: Min guard;Sit to/from stand Lower Body Bathing Details (indicate cue type and reason): simulated Upper Body Dressing : Set up;Sitting   Lower Body Dressing: Minimal assistance;Min guard;Sit to/from stand   Toilet Transfer: Min guard;Ambulation;RW;Comfort height toilet;Grab bars;Cueing for safety   Toileting- Clothing Manipulation and Hygiene: Min guard;Sit to/from stand       Functional mobility during ADLs: Min guard;Rolling walker;Cueing for safety       Vision       Perception     Praxis      Cognition                                                Exercises     Shoulder Instructions       General Comments      Pertinent Vitals/ Pain        sternal. discomfort  Home Living                                          Prior Functioning/Environment              Frequency  Min 2X/week  Progress Toward Goals  OT Goals(current goals can now be found in the care plan section)  Progress towards OT goals: Progressing toward goals  Acute Rehab OT Goals Patient Stated Goal: Return home and get better  Plan Discharge plan needs to be updated;Frequency remains appropriate    Co-evaluation                 AM-PAC OT "6 Clicks" Daily Activity     Outcome Measure   Help from another person eating meals?: None Help from another person taking care of personal grooming?: A Little Help from another person toileting, which includes using toliet, bedpan, or urinal?: A Little Help from another person bathing (including washing, rinsing, drying)?: A Little Help from another person to put on and taking off regular upper body clothing?: None Help from another  person to put on and taking off regular lower body clothing?: A Little 6 Click Score: 20    End of Session Equipment Utilized During Treatment: Gait belt;Rolling walker  OT Visit Diagnosis: Unsteadiness on feet (R26.81);Muscle weakness (generalized) (M62.81);Pain Pain - part of body:  (sternal)   Activity Tolerance Patient tolerated treatment well   Patient Left with call bell/phone within reach;in bed;with bed alarm set   Nurse Communication          Time: 3154-0086 OT Time Calculation (min): 20 min  Charges: OT General Charges $OT Visit: 1 Visit OT Treatments $Self Care/Home Management : 8-22 mins     Britt Bottom 04/19/2021, 2:39 PM

## 2021-04-19 NOTE — Care Management Important Message (Signed)
Important Message  Patient Details  Name: Joshua Henson MRN: [id number] Date of Birth: 04/28/1946   Medicare Important Message Given:  Yes     Shelda Altes 04/19/2021, 11:28 AM

## 2021-04-19 NOTE — Progress Notes (Signed)
Physical Therapy Treatment Patient Details Name: Joshua Henson MRN: [id number] DOB: Dec 27, 1945 Today's Date: 04/19/2021    History of Present Illness Pt is a 75 y.o. male admitted 04/24/21 with PEA arrest requiring 20-min CPR for ROSC; suspected overdose with UDS (+) cocaine, THC, benzo. ETT 4/26-4/27. Head CT negative for acute injury. PMH includes polysubstance abuse, CAD, HTN, HF, thoracic aortic aneurysm, L frontal infarct (2015).   PT Comments    Pt progressing well with mobility. Today's session focused on transfer and gait training, pt able to ambulate ~400' without DME, progressing to supervision for safety; also tolerated stair training (11 steps) with supervision. Pt continues to endorse chest soreness from CPR. Reviewed educ re: chest/abdominal precautions for comfort (log roll, bracing to cough, etc.), fall risk prevention, activity recommendations, incentive spirometer use (pt able to pull ~1774mL). Pt's wife present and supportive; both in agreement pt will have necessary DME and assist for home. No longer feel pt requires follow-up PT services. Will continue to follow acutely if pt to remain admitted.  SpO2 down to 92% with ambulation, DOE 3/4 HR 96    Follow Up Recommendations  No PT follow up;Supervision - Intermittent     Equipment Recommendations  None recommended by PT    Recommendations for Other Services       Precautions / Restrictions Precautions Precautions: Fall;Other (comment) Precaution Comments: Chest/abdominal precautions for comfort (soreness from CPR) Restrictions Weight Bearing Restrictions: No    Mobility  Bed Mobility Overal bed mobility: Independent Bed Mobility: Rolling;Sidelying to Sit Rolling: Supervision Sidelying to sit: Supervision     Sit to sidelying: Supervision General bed mobility comments: Bed flat, no bed rail; independent with log roll technique    Transfers Overall transfer level: Needs assistance Equipment used:  None Transfers: Sit to/from Stand Sit to Stand: Supervision Stand pivot transfers: Min guard       General transfer comment: No overt instability or LOB, supervision due to fall risk  Ambulation/Gait Ambulation/Gait assistance: Min guard;Supervision Gait Distance (Feet): 400 Feet Assistive device: None Gait Pattern/deviations: Step-through pattern;Decreased stride length Gait velocity: Decreased   General Gait Details: Slow, guarded gait without DME, initial min guard for balance progressing to supervision as pt's stability and confidence improved   Stairs Stairs: Yes Stairs assistance: Supervision Stair Management: One rail Right;Two rails;Step to pattern;Alternating pattern;Forwards Number of Stairs: 11 General stair comments: Ascend with alternating pattern and single UE support on R-side rail, descend with step-to pattern and bilateral rail support; pt reports this is baseline technique due to chronic issues; supervision for asfety, DOE 3/4   Wheelchair Mobility    Modified Rankin (Stroke Patients Only)       Balance Overall balance assessment: Needs assistance Sitting-balance support: No upper extremity supported;Feet supported Sitting balance-Leahy Scale: Good     Standing balance support: During functional activity;No upper extremity supported Standing balance-Leahy Scale: Good                              Cognition Arousal/Alertness: Awake/alert Behavior During Therapy: WFL for tasks assessed/performed Overall Cognitive Status: Within Functional Limits for tasks assessed                                        Exercises Other Exercises Other Exercises: IS x5 (pulling ~1750 mL)    General Comments General comments (skin integrity, edema,  etc.): Wife present and supportive, agreeable to return home; educ on recommend no PT follow-up, pt and wife in agreement; own necessary DME and wife will be available to assist if needed.  Reviewed educ re: chest/abdominal precautions for comfort (log roll, bracing to cough), fall risk prevention, activity recommendations, incentive spirometer use      Pertinent Vitals/Pain Pain Assessment: Faces Faces Pain Scale: Hurts little more Pain Location: chest and upper ribs Pain Descriptors / Indicators: Grimacing;Guarding;Sharp Pain Intervention(s): Monitored during session;Other (comment) (chest/abdominal precautions for comfort)    Home Living                      Prior Function            PT Goals (current goals can now be found in the care plan section) Acute Rehab PT Goals Patient Stated Goal: return home asap PT Goal Formulation: With patient/family Time For Goal Achievement: 04/30/21 Potential to Achieve Goals: Good Progress towards PT goals: Progressing toward goals    Frequency    Min 3X/week      PT Plan Discharge plan needs to be updated    Co-evaluation              AM-PAC PT "6 Clicks" Mobility   Outcome Measure  Help needed turning from your back to your side while in a flat bed without using bedrails?: None Help needed moving from lying on your back to sitting on the side of a flat bed without using bedrails?: None Help needed moving to and from a bed to a chair (including a wheelchair)?: A Little Help needed standing up from a chair using your arms (e.g., wheelchair or bedside chair)?: A Little Help needed to walk in hospital room?: A Little Help needed climbing 3-5 steps with a railing? : A Little 6 Click Score: 20    End of Session Equipment Utilized During Treatment: Gait belt Activity Tolerance: Patient tolerated treatment well Patient left: in bed;with call bell/phone within reach;with family/visitor present Nurse Communication: Mobility status PT Visit Diagnosis: Other abnormalities of gait and mobility (R26.89);Muscle weakness (generalized) (M62.81);Unsteadiness on feet (R26.81)     Time: 2426-8341 PT Time  Calculation (min) (ACUTE ONLY): 18 min  Charges:  $Gait Training: 8-22 mins                     Mabeline Caras, PT, DPT Acute Rehabilitation Services  Pager [phone] Office [place] 04/19/2021, 5:07 PM

## 2021-04-19 NOTE — Discharge Summary (Signed)
Physician Discharge Summary  AUTREY HUMAN HEN:[id number] DOB: 22-Aug-1946 DOA: 02-May-2021  PCP: Clovia Cuff, MD  Admit date: May 02, 2021 Discharge date: 04/19/2021  Time spent: 33 minutes  Recommendations for Outpatient Follow-up:  1. Will need Chem-7, CBC in about 1 to 2 weeks 2. Will need continued outpatient support and cessation counseling 3. Consider outpatient thoracic aortic aneurysm follow-up in the outpatient setting to be scheduled not emergently 4. 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) EtOH with prior alcoholic pancreatitis 3614, polysubstance (cocaine), Tobacco abuse CAD (cath 2017 mild RCA disease), Left frontal infarct 2015 HFpEF, Prior ascending thoracic aortic aneurysm diagnosed 2018 HTN,  Depression  Admit 05/02/21 PEA arrest ROSC-suspected overdose substance-Rx Narcan intubated for airway protection Head CT no acute process UDS showed positive cocaine THC benzo Given Unasyn Rocephin Required Levophed initially  Extubated 4/27  He is currently awaiting therapy evaluation and is being monitored to ensure he is not withdrawing from alcohol  Hospital Course:  Status post PEA arrest with 20 minutes CPR with ROSC chest pain from compressions--seems positional Start tramadol low dose and given 3-day supply for pain control for pain above 7/10 To take ibuprofen around-the-clock with food Mobilize with therapy services Elevated troponin--patient did have CPR Rpt Troponin trended  downwards EKG nonspecific for Infarct--hold further work-up at this time Chest pain clearly musculoskeletal Polysubstance abuse UDS positive for cocaine and THC benzosg Continue every 12 as needed Ativan--can resume his home dosing of this Continue gabapentin 200 twice daily for withdrawal support Situational depression/adjustment disorder NOS Patient has several psychosocial stressors including his wife having cancer his children dealing with this etc. Occasionally sleepy in the mornings Cut back Seroquel to 12.5 daily and discharged on this short-term Continue BuSpar 10 twice daily Acute metabolic encephalopathy on admission Resolved currently AKI on admit Felt to be prerenal in origin Azotemia is improved from admission Mild hypokalemia Resolved ?  Pneumonia on admission Received Rocephin ending 4/28 No white count no fever    Discharge Exam: Vitals:   04/19/21 0500 04/19/21 1439  BP:  (!) 164/91  Pulse:  80  Resp:  16  Temp: 98.4 F (36.9 C) 98.6 F (37 C)  SpO2:  90%    Subj on day of d/c   Coherent awake alert no distress Moving around much better eating and drinking  General Exam on discharge  Awake coherent no distress EOMI NCAT no focal deficit S1-S2 no murmur no rub no gallop Chest clear no rales no rhonchi Abdomen soft nontender no rebound  Discharge Instructions   Discharge Instructions    Diet - low sodium heart healthy   Complete by: As directed    Discharge instructions   Complete by: As directed    Take Tramadol for Muscular Chest pain--take if the pain is severe [above 6 /10] Take Ibuprofen for regular pain that is 4-5/10  Please note some changes to the discharge medication list.  You will need to see your primary MD in ~ 1 -2 weeks and ensure that you continue to do  well. Good luck and I hope u have a great summer   Increase activity slowly   Complete by: As directed      Allergies as of 04/19/2021      Reactions   Lisinopril Other  (See Comments)   syncope   Celecoxib Rash      Medication List    STOP taking these medications   atorvastatin 40 MG tablet Commonly known as: LIPITOR   triamterene-hydrochlorothiazide 37.5-25 MG capsule Commonly known as: DYAZIDE     TAKE these medications   aspirin 81 MG EC tablet Take 1 tablet (81 mg total) by mouth daily.   busPIRone 10 MG tablet Commonly known as: BUSPAR Take 10 mg by mouth 2 (two) times daily.   carvedilol 12.5 MG tablet Commonly known as: COREG Take 1 tablet (12.5 mg total) by mouth 2 (two) times daily.   cholecalciferol 1000 units tablet Commonly known as: VITAMIN D Take 1,000 Units by mouth daily.   Co Q-10 200 MG Caps Take 200 mg by mouth at bedtime.   Fish Oil 1000 MG Caps Take 1,000 mg by mouth every evening.   folic acid 1 MG tablet Commonly known as: FOLVITE take 1 tablet by mouth once daily   gabapentin 100 MG capsule Commonly known as: NEURONTIN Take 2 capsules (200 mg total) by mouth 2 (two) times daily. What changed: when to take this   ibuprofen 400 MG tablet Commonly known as: ADVIL Take 1 tablet (400 mg total) by mouth every 6 (six) hours as needed for moderate pain.   LORazepam 0.5 MG tablet Commonly known as: ATIVAN Take 0.5 mg by mouth 2 (two) times daily as needed for anxiety.   mirtazapine 7.5 MG tablet Commonly known as: REMERON Take 7.5 mg by mouth at bedtime.   nitroGLYCERIN 0.4 MG SL tablet Commonly known as: NITROSTAT Place 1 tablet (0.4 mg total) under the tongue every 5 (five) minutes as needed for chest pain.   pantoprazole 40 MG tablet Commonly known as: PROTONIX Take 1 tablet (40 mg total) by mouth daily. Start taking on: Apr 20, 2021   QUEtiapine 25 MG tablet Commonly known as: SEROQUEL Take 0.5 tablets (12.5 mg total) by mouth at bedtime.   tamsulosin 0.4 MG Caps capsule Commonly known as: FLOMAX Take 1 capsule (0.4 mg total) by mouth daily.   thiamine 100 MG tablet Commonly known as:  Vitamin B-1 Take 100 mg by mouth daily. What changed: Another medication with the same name was added. Make sure you understand how and when to take each.   thiamine 100 MG tablet Take 1 tablet (100 mg total) by mouth daily. Start taking on: Apr 20, 2021 What changed: You were already taking a medication with the same name, and this prescription was added. Make sure you understand how and when to take each.   traMADol 50 MG tablet Commonly known as: ULTRAM Take 1 tablet (50 mg total) by mouth every 6 (six) hours as needed for up to 3 days for severe pain.      Allergies  Allergen Reactions  . Lisinopril Other (See Comments)    syncope  . Celecoxib Rash      The results of significant diagnostics from this hospitalization (including imaging, microbiology, ancillary and laboratory) are listed below for reference.    Significant Diagnostic Studies: CT HEAD WO CONTRAST  Result Date: 04/14/2021 CLINICAL DATA:  Initial evaluation for acute headache, intracranial hemorrhage suspected, mental status change. EXAM: CT HEAD WITHOUT CONTRAST TECHNIQUE:  Contiguous axial images were obtained from the base of the skull through the vertex without intravenous contrast. COMPARISON:  Prior head CT from 06/30/2018. FINDINGS: Brain: Generalized age-related cerebral atrophy with chronic microvascular ischemic disease, stable. No acute intracranial hemorrhage. No visible acute large vessel territory infarct. No mass lesion, midline shift or mass effect. No hydrocephalus or extra-axial fluid collection. Vascular: No hyperdense vessel. Calcified atherosclerosis present at the skull base. Skull: Scalp soft tissues demonstrate no acute finding. Calvarium intact. Sinuses/Orbits: Globes and orbital soft tissues demonstrate no acute finding. Few small calcific densities adjacent to the left globe noted, similar to previous. Scattered mucosal thickening in opacity noted throughout the ethmoidal air cells, sphenoid  sinuses, and maxillary sinuses. Changes of chronic maxillary sinusitis noted as well. Endotracheal tube in place. No mastoid effusion. Other: None. IMPRESSION: 1. No acute intracranial abnormality. 2. Generalized age-related cerebral atrophy with chronic microvascular ischemic disease, stable. Electronically Signed   By: Jeannine Boga M.D.   On: 04/14/2021 03:15   DG Pelvis Portable  Result Date: 05-11-2021 CLINICAL DATA:  Trauma.  Cardiac arrest.  Overdose. EXAM: PORTABLE PELVIS 1-2 VIEWS COMPARISON:  None. FINDINGS: Mild degenerative changes in the hips. No evidence of acute fracture or dislocation of the pelvis. SI joints and symphysis pubis are not displaced. Surgical clips in the low pelvis over the prostate region. Vascular calcifications. IMPRESSION: No acute bony abnormalities. Electronically Signed   By: Lucienne Capers M.D.   On: 11-May-2021 23:18   DG Chest Portable 1 View  Result Date: 04/14/2021 CLINICAL DATA:  Central line placement EXAM: PORTABLE CHEST 1 VIEW COMPARISON:  2021-05-11 FINDINGS: Shallow inspiration. Heart size and pulmonary vascularity are normal. Lungs are clear. No pleural effusions. No pneumothorax. Endotracheal tube is unchanged in position. A right central venous catheter has been placed with tip over the low SVC region. IMPRESSION: Interval placement of right central venous catheter. No pneumothorax. Electronically Signed   By: Lucienne Capers M.D.   On: 04/14/2021 01:26   DG Chest Portable 1 View  Result Date: 05/11/21 CLINICAL DATA:  Cardiac arrest. Overdose. Trauma. Post intubation. EXAM: PORTABLE CHEST 1 VIEW COMPARISON:  09/24/2018 FINDINGS: An endotracheal tube has been placed with tip measuring 7.2 cm above the carina. Heart size and pulmonary vascularity are normal. Lungs are clear. No pleural effusions. No pneumothorax. Mediastinal contours appear intact. IMPRESSION: Endotracheal tube tip measures 7.2 cm above the carina. No evidence of active  pulmonary disease. Electronically Signed   By: Lucienne Capers M.D.   On: 05-11-2021 23:17    Microbiology: Recent Results (from the past 240 hour(s))  Resp Panel by RT-PCR (Flu A&B, Covid) Nasopharyngeal Swab     Status: None   Collection Time: 05-11-21 10:35 PM   Specimen: Nasopharyngeal Swab; Nasopharyngeal(NP) swabs in vial transport medium  Result Value Ref Range Status   SARS Coronavirus 2 by RT PCR NEGATIVE NEGATIVE Final    Comment: (NOTE) SARS-CoV-2 target nucleic acids are NOT DETECTED.  The SARS-CoV-2 RNA is generally detectable in upper respiratory specimens during the acute phase of infection. The lowest concentration of SARS-CoV-2 viral copies this assay can detect is 138 copies/mL. A negative result does not preclude SARS-Cov-2 infection and should not be used as the sole basis for treatment or other patient management decisions. A negative result may occur with  improper specimen collection/handling, submission of specimen other than nasopharyngeal swab, presence of viral mutation(s) within the areas targeted by this assay, and inadequate number of viral copies(<138 copies/mL). A negative result  must be combined with clinical observations, patient history, and epidemiological information. The expected result is Negative.  Fact Sheet for Patients:  EntrepreneurPulse.com.au  Fact Sheet for Healthcare Providers:  IncredibleEmployment.be  This test is no t yet approved or cleared by the [place] FDA and  has been authorized for detection and/or diagnosis of SARS-CoV-2 by FDA under an Emergency Use Authorization (EUA). This EUA will remain  in effect (meaning this test can be used) for the duration of the COVID-19 declaration under Section 564(b)(1) of the Act, 21 U.S.C.section 360bbb-3(b)(1), unless the authorization is terminated  or revoked sooner.       Influenza A by PCR NEGATIVE NEGATIVE Final   Influenza B by PCR  NEGATIVE NEGATIVE Final    Comment: (NOTE) The Xpert Xpress SARS-CoV-2/FLU/RSV plus assay is intended as an aid in the diagnosis of influenza from Nasopharyngeal swab specimens and should not be used as a sole basis for treatment. Nasal washings and aspirates are unacceptable for Xpert Xpress SARS-CoV-2/FLU/RSV testing.  Fact Sheet for Patients: EntrepreneurPulse.com.au  Fact Sheet for Healthcare Providers: IncredibleEmployment.be  This test is not yet approved or cleared by the [place] FDA and has been authorized for detection and/or diagnosis of SARS-CoV-2 by FDA under an Emergency Use Authorization (EUA). This EUA will remain in effect (meaning this test can be used) for the duration of the COVID-19 declaration under Section 564(b)(1) of the Act, 21 U.S.C. section 360bbb-3(b)(1), unless the authorization is terminated or revoked.  Performed at [facility], [place] [address], [place], [place] [address]   MRSA PCR Screening     Status: None   Collection Time: 04/14/21  3:00 AM   Specimen: Nasal Mucosa; Nasopharyngeal  Result Value Ref Range Status   MRSA by PCR NEGATIVE NEGATIVE Final    Comment:        The GeneXpert MRSA Assay (FDA approved for NASAL specimens only), is one component of a comprehensive MRSA colonization surveillance program. It is not intended to diagnose MRSA infection nor to guide or monitor treatment for MRSA infections. Performed at [facility], [place] [address] [place], [place] [address]      Labs: Basic Metabolic Panel: Recent Labs  Lab 2021-05-07 2241 05-07-21 2246 2021-05-07 2335 04/14/21 0310 04/15/21 0253 04/17/21 0352 04/18/21 0139  NA 141 142  142 143 139 136 138 140  K 4.4 4.4  4.4 3.7 4.5 3.6 3.3* 3.7  CL 107 107  --  108 104 103 105  CO2 26  --   --  22 25 25 28   GLUCOSE 174* 169*  --  112* 108* 106* 104*  BUN 17 21  --  17 14 10 11   CREATININE 1.66* 1.50*  --  1.32*  1.33*  1.18 0.88 0.91  CALCIUM 8.8*  --   --  8.2* 8.1* 8.8* 8.8*  MG  --   --   --  2.0  --  2.1  --   PHOS  --   --   --  3.5  --   --   --    Liver Function Tests: Recent Labs  Lab 2021/05/07 2241 04/14/21 0310 04/15/21 0253 04/17/21 0352 04/18/21 0139  AST 36 38 30 22 25   ALT 36 34 26 21 24   ALKPHOS 81 64 57 56 60  BILITOT 0.3 1.1 0.7 1.3* 1.1  PROT 6.5 5.8* 5.4* 5.9* 5.6*  ALBUMIN 3.5 3.2* 2.8* 2.9* 2.7*   No results for input(s): LIPASE, AMYLASE in  the last 168 hours. Recent Labs  Lab 04/14/21 0310  AMMONIA 23   CBC: Recent Labs  Lab 03/29/2021 2241 03/29/2021 2246 04/11/2021 2335 04/14/21 0310 04/15/21 0253 04/17/21 0352  WBC 15.7*  --   --  22.1* 10.8* 9.6  NEUTROABS  --   --   --   --   --  6.5  HGB 12.3* 15.3  15.0 12.6* 13.8 12.2* 13.2  HCT 34.6* 45.0  44.0 37.0* 39.8 32.8* 37.9*  MCV 103.6*  --   --  100.3* 98.8 93.8  PLT 139*  --   --  199 170 194   Cardiac Enzymes: No results for input(s): CKTOTAL, CKMB, CKMBINDEX, TROPONINI in the last 168 hours. BNP: BNP (last 3 results) No results for input(s): BNP in the last 8760 hours.  ProBNP (last 3 results) No results for input(s): PROBNP in the last 8760 hours.  CBG: Recent Labs  Lab 04/18/21 0743 04/18/21 1131 04/18/21 1635 04/19/21 0745 04/19/21 1140  GLUCAP 104* 110* 175* 101* 118*       Signed:  [name] MD   [facility] 04/19/2021, 5:54 PM

## 2021-04-20 LAB — GLUCOSE, CAPILLARY: Glucose-Capillary: 149 mg/dL — ABNORMAL HIGH (ref 70–99)

## 2021-04-23 DIAGNOSIS — F341 Dysthymic disorder: Secondary | ICD-10-CM | POA: Diagnosis not present

## 2021-04-23 DIAGNOSIS — I10 Essential (primary) hypertension: Secondary | ICD-10-CM | POA: Diagnosis not present

## 2021-04-23 DIAGNOSIS — G894 Chronic pain syndrome: Secondary | ICD-10-CM | POA: Diagnosis not present

## 2021-04-23 DIAGNOSIS — F419 Anxiety disorder, unspecified: Secondary | ICD-10-CM | POA: Diagnosis not present

## 2021-06-25 DIAGNOSIS — F1320 Sedative, hypnotic or anxiolytic dependence, uncomplicated: Secondary | ICD-10-CM | POA: Diagnosis not present

## 2021-06-25 DIAGNOSIS — F419 Anxiety disorder, unspecified: Secondary | ICD-10-CM | POA: Diagnosis not present

## 2021-06-25 DIAGNOSIS — I10 Essential (primary) hypertension: Secondary | ICD-10-CM | POA: Diagnosis not present

## 2021-06-25 DIAGNOSIS — G894 Chronic pain syndrome: Secondary | ICD-10-CM | POA: Diagnosis not present

## 2021-10-08 DIAGNOSIS — F1010 Alcohol abuse, uncomplicated: Secondary | ICD-10-CM | POA: Diagnosis not present

## 2021-10-08 DIAGNOSIS — I10 Essential (primary) hypertension: Secondary | ICD-10-CM | POA: Diagnosis not present

## 2021-10-08 DIAGNOSIS — H9193 Unspecified hearing loss, bilateral: Secondary | ICD-10-CM | POA: Diagnosis not present

## 2021-10-08 DIAGNOSIS — F419 Anxiety disorder, unspecified: Secondary | ICD-10-CM | POA: Diagnosis not present
